# Patient Record
Sex: Male | Born: 1940
Health system: Southern US, Community
[De-identification: ages and names within clinical notes are randomized; demographics above are authoritative.]

## PROBLEM LIST (undated history)

## (undated) DIAGNOSIS — C9110 Chronic lymphocytic leukemia of B-cell type not having achieved remission: Secondary | ICD-10-CM

## (undated) DIAGNOSIS — K222 Esophageal obstruction: Secondary | ICD-10-CM

## (undated) DIAGNOSIS — G4730 Sleep apnea, unspecified: Secondary | ICD-10-CM

## (undated) DIAGNOSIS — E785 Hyperlipidemia, unspecified: Secondary | ICD-10-CM

## (undated) DIAGNOSIS — K648 Other hemorrhoids: Secondary | ICD-10-CM

## (undated) DIAGNOSIS — I4891 Unspecified atrial fibrillation: Secondary | ICD-10-CM

## (undated) DIAGNOSIS — K644 Residual hemorrhoidal skin tags: Secondary | ICD-10-CM

## (undated) DIAGNOSIS — K5790 Diverticulosis of intestine, part unspecified, without perforation or abscess without bleeding: Secondary | ICD-10-CM

## (undated) DIAGNOSIS — K219 Gastro-esophageal reflux disease without esophagitis: Secondary | ICD-10-CM

## (undated) DIAGNOSIS — E119 Type 2 diabetes mellitus without complications: Secondary | ICD-10-CM

## (undated) DIAGNOSIS — Z860100 Personal history of colon polyps, unspecified: Secondary | ICD-10-CM

## (undated) DIAGNOSIS — I10 Essential (primary) hypertension: Secondary | ICD-10-CM

## (undated) DIAGNOSIS — K449 Diaphragmatic hernia without obstruction or gangrene: Secondary | ICD-10-CM

## (undated) DIAGNOSIS — D72820 Lymphocytosis (symptomatic): Secondary | ICD-10-CM

## (undated) HISTORY — DX: Diaphragmatic hernia without obstruction or gangrene: K44.9

## (undated) HISTORY — DX: Esophageal obstruction: K22.2

## (undated) HISTORY — DX: Residual hemorrhoidal skin tags: K64.4

## (undated) HISTORY — DX: Chronic lymphocytic leukemia of B-cell type not having achieved remission: C91.10

## (undated) HISTORY — DX: Hyperlipidemia, unspecified: E78.5

## (undated) HISTORY — DX: Lymphocytosis (symptomatic): D72.820

## (undated) HISTORY — DX: Other hemorrhoids: K64.8

## (undated) HISTORY — PX: BLEPHAROPLASTY: SUR158

## (undated) HISTORY — DX: Gastro-esophageal reflux disease without esophagitis: K21.9

## (undated) HISTORY — DX: Personal history of colon polyps, unspecified: Z86.0100

## (undated) HISTORY — DX: Diverticulosis of intestine, part unspecified, without perforation or abscess without bleeding: K57.90

## (undated) HISTORY — PX: POLYPECTOMY: SHX149

## (undated) HISTORY — DX: Personal history of colonic polyps: Z86.010

## (undated) HISTORY — PX: HEMORRHOID SURGERY: SHX153

## (undated) HISTORY — DX: Sleep apnea, unspecified: G47.30

---

## 1999-09-27 ENCOUNTER — Ambulatory Visit (HOSPITAL_COMMUNITY): Admission: RE | Admit: 1999-09-27 | Discharge: 1999-09-27 | Payer: Self-pay | Admitting: Orthopedic Surgery

## 2000-12-16 HISTORY — PX: KNEE SURGERY: SHX244

## 2005-12-16 HISTORY — PX: SHOULDER SURGERY: SHX246

## 2007-01-09 ENCOUNTER — Emergency Department (HOSPITAL_COMMUNITY): Admission: EM | Admit: 2007-01-09 | Discharge: 2007-01-09 | Payer: Self-pay | Admitting: Emergency Medicine

## 2007-11-29 ENCOUNTER — Emergency Department (HOSPITAL_COMMUNITY): Admission: EM | Admit: 2007-11-29 | Discharge: 2007-11-29 | Payer: Self-pay | Admitting: Emergency Medicine

## 2010-01-18 ENCOUNTER — Encounter: Admission: RE | Admit: 2010-01-18 | Discharge: 2010-01-18 | Payer: Self-pay | Admitting: Endocrinology

## 2010-12-16 HISTORY — PX: COLONOSCOPY: SHX174

## 2011-09-23 LAB — CBC
HCT: 44.4
MCV: 91.4
Platelets: 255
RDW: 13.4

## 2011-09-23 LAB — I-STAT 8, (EC8 V) (CONVERTED LAB)
Acid-base deficit: 1
Bicarbonate: 23.7
TCO2: 25
pCO2, Ven: 39.1 — ABNORMAL LOW
pH, Ven: 7.391 — ABNORMAL HIGH

## 2011-09-23 LAB — DIFFERENTIAL
Monocytes Relative: 4
Neutrophils Relative %: 79 — ABNORMAL HIGH

## 2011-09-23 LAB — POCT CARDIAC MARKERS
CKMB, poc: 1.1
CKMB, poc: <1 — ABNORMAL LOW
Myoglobin, poc: 86
Operator id: 257131
Troponin i, poc: <0.05

## 2011-09-23 LAB — POCT I-STAT CREATININE: Operator id: 257131

## 2012-03-10 ENCOUNTER — Other Ambulatory Visit: Payer: Self-pay | Admitting: Gastroenterology

## 2012-03-16 ENCOUNTER — Ambulatory Visit
Admission: RE | Admit: 2012-03-16 | Discharge: 2012-03-16 | Disposition: A | Discharge status: Home / Self Care | Payer: Medicare Other | Source: Ambulatory Visit | Attending: Gastroenterology | Admitting: Gastroenterology

## 2013-06-30 ENCOUNTER — Other Ambulatory Visit: Payer: Medicare Other

## 2013-07-02 ENCOUNTER — Ambulatory Visit: Payer: Medicare Other | Admitting: Endocrinology

## 2013-07-12 ENCOUNTER — Other Ambulatory Visit: Payer: Self-pay | Admitting: *Deleted

## 2013-07-12 MED ORDER — DOXAZOSIN MESYLATE 4 MG PO TABS: 4.0000 mg | ORAL_TABLET | Freq: Every day | ORAL | Status: DC

## 2013-07-14 ENCOUNTER — Other Ambulatory Visit: Payer: Self-pay | Admitting: *Deleted

## 2013-07-14 DIAGNOSIS — E119 Type 2 diabetes mellitus without complications: Secondary | ICD-10-CM

## 2013-07-16 ENCOUNTER — Other Ambulatory Visit (INDEPENDENT_AMBULATORY_CARE_PROVIDER_SITE_OTHER): Payer: Medicare Other

## 2013-07-16 DIAGNOSIS — E119 Type 2 diabetes mellitus without complications: Secondary | ICD-10-CM

## 2013-07-16 LAB — COMPREHENSIVE METABOLIC PANEL
ALT: 19 U/L (ref 0–53)
AST: 17 U/L (ref 0–37)
BUN: 18 mg/dL (ref 6–23)
Chloride: 104 mEq/L (ref 96–112)
Creatinine, Ser: 1.2 mg/dL (ref 0.4–1.5)
Glucose, Bld: 215 mg/dL — ABNORMAL HIGH (ref 70–99)
Potassium: 3.8 mEq/L (ref 3.5–5.1)
Total Bilirubin: 0.3 mg/dL (ref 0.3–1.2)

## 2013-07-16 LAB — LIPID PANEL
HDL: 56.2 mg/dL (ref >39.00)
VLDL: 15.6 mg/dL (ref 0.0–40.0)

## 2013-07-16 LAB — URINALYSIS
Ketones, ur: NEGATIVE
Specific Gravity, Urine: 1.01 (ref 1.000–1.030)
Urine Glucose: >=1000
Urobilinogen, UA: 0.2 (ref 0.0–1.0)

## 2013-07-16 LAB — MICROALBUMIN / CREATININE URINE RATIO: Microalb Creat Ratio: 0.6 mg/g (ref 0.0–30.0)

## 2013-07-20 ENCOUNTER — Encounter: Payer: Self-pay | Admitting: Endocrinology

## 2013-07-20 ENCOUNTER — Ambulatory Visit (INDEPENDENT_AMBULATORY_CARE_PROVIDER_SITE_OTHER): Payer: Medicare Other | Admitting: Endocrinology

## 2013-07-20 ENCOUNTER — Other Ambulatory Visit: Payer: Self-pay | Admitting: *Deleted

## 2013-07-20 VITALS — BP 126/80 | HR 56 | Temp 98.2°F | Resp 12 | Ht 67.0 in | Wt 166.3 lb

## 2013-07-20 DIAGNOSIS — E7800 Pure hypercholesterolemia, unspecified: Secondary | ICD-10-CM

## 2013-07-20 DIAGNOSIS — IMO0001 Reserved for inherently not codable concepts without codable children: Secondary | ICD-10-CM

## 2013-07-20 DIAGNOSIS — E119 Type 2 diabetes mellitus without complications: Secondary | ICD-10-CM | POA: Insufficient documentation

## 2013-07-20 DIAGNOSIS — I10 Essential (primary) hypertension: Secondary | ICD-10-CM

## 2013-07-20 MED ORDER — BENAZEPRIL HCL 20 MG PO TABS: 20.0000 mg | ORAL_TABLET | Freq: Every day | ORAL | Status: DC

## 2013-07-20 NOTE — Patient Instructions (Signed)
Levemir 19  In pm and 6 in am  Please check blood sugars at least half the time about 2 hours after any meal and as directed on waking up. Please bring blood sugar monitor to each visit  Cover Carbs with 1 unit for every 10 grams

## 2013-07-20 NOTE — Progress Notes (Signed)
Patient ID: Tony Marshall, male   DOB: 06-07-1941, 72 y.o.   MRN: [id number]  Tony Marshall is an 72 y.o. male.   Reason for Appointment: Diabetes follow-up   History of Present Illness   Diagnosis: Type 2 DIABETES MELITUS, date of diagnosis:  1992        Oral hypoglycemic drugs: Actos        Side effects from medications: None Insulin regimen: Levemir insulin 7 units in the morning and 17 in the evening, mealtime insulin 2-8 units           Proper timing of medications in relation to meals: Yes.         Monitors blood glucose: Once a day.    Glucometer: One Touch.          Blood Glucose readings from meter download: readings before breakfast: 127-248 with median about 170, nonfasting 44-270 with low sugars mid-day or before supper and high sugars after lunch  Hypoglycemia frequency:  midday or supper time, lowest reading at 6 PM.          Meals: 3 meals per day.  he is eating more at lunch sometimes and will eat much more carbohydrates in the form of sandwiches         Physical activity: exercise: Gardening and walking, no gram exercise usually            His blood sugar has been very difficult to control over the last few years because of variability in his blood sugars He is essentially beginning of a type I patient with requiring relatively low amounts of insulin and twice a day dosage of basal insulin He is probably doing somewhat better with Levemir compared to Lantus However his FASTING blood sugars are still quite variable and recently higher for no apparent reason. He has not increased his evening Levemir despite higher fasting readings He is not checking his readings after supper and not clear how well his evening insulin dosage is working  Also he has recently cut back on carbohydrate at lunchtime which was increasing his blood sugar, also has not been paying attention to the amount of carbohydrate with regard to insulin dose  Appointment on 07/16/2013  Component Date  Value Range Status  . Sodium 07/16/2013 138  135 - 145 mEq/L Final  . Potassium 07/16/2013 3.8  3.5 - 5.1 mEq/L Final  . Chloride 07/16/2013 104  96 - 112 mEq/L Final  . CO2 07/16/2013 28  19 - 32 mEq/L Final  . Glucose, Bld 07/16/2013 215* 70 - 99 mg/dL Final  . BUN 40/98/1191 18  6 - 23 mg/dL Final  . Creatinine, Ser 07/16/2013 1.2  0.4 - 1.5 mg/dL Final  . Total Bilirubin 07/16/2013 0.3  0.3 - 1.2 mg/dL Final  . Alkaline Phosphatase 07/16/2013 32* 39 - 117 U/L Final  . AST 07/16/2013 17  0 - 37 U/L Final  . ALT 07/16/2013 19  0 - 53 U/L Final  . Total Protein 07/16/2013 6.5  6.0 - 8.3 g/dL Final  . Albumin 47/82/9562 3.8  3.5 - 5.2 g/dL Final  . Calcium 13/07/6577 9.1  8.4 - 10.5 mg/dL Final  . GFR 46/96/2952 61.32  >60.00 mL/min Final  . Hemoglobin A1C 07/16/2013 7.9* 4.6 - 6.5 % Final   Glycemic Control Guidelines for People with Diabetes:Non Diabetic:  <6%Goal of Therapy: <7%Additional Action Suggested:  >8%   . Color, Urine 07/16/2013 LT. YELLOW  Yellow;Lt. Yellow Final  . APPearance 07/16/2013  CLEAR  Clear Final  . Specific Gravity, Urine 07/16/2013 1.010  1.000-1.030 Final  . pH 07/16/2013 6.0  5.0 - 8.0 Final  . Total Protein, Urine 07/16/2013 NEGATIVE  Negative Final  . Urine Glucose 07/16/2013 >=1000  Negative Final  . Ketones, ur 07/16/2013 NEGATIVE  Negative Final  . Bilirubin Urine 07/16/2013 NEGATIVE  Negative Final  . Hgb urine dipstick 07/16/2013 NEGATIVE  Negative Final  . Urobilinogen, UA 07/16/2013 0.2  0.0 - 1.0 Final  . Leukocytes, UA 07/16/2013 NEGATIVE  Negative Final  . Nitrite 07/16/2013 NEGATIVE  Negative Final  . Microalb, Ur 07/16/2013 0.3  0.0 - 1.9 mg/dL Final  . Creatinine,U 62/95/2841 47.7   Final  . Microalb Creat Ratio 07/16/2013 0.6  0.0 - 30.0 mg/g Final  . Cholesterol 07/16/2013 147  0 - 200 mg/dL Final   ATP III Classification       Desirable:  < 200 mg/dL               Borderline High:  200 - 239 mg/dL          High:  > = 324 mg/dL  .  Triglycerides 07/16/2013 78.0  0.0 - 149.0 mg/dL Final   Normal:  <401 mg/dLBorderline High:  150 - 199 mg/dL  . HDL 07/16/2013 56.20  >39.00 mg/dL Final  . VLDL 02/72/5366 15.6  0.0 - 40.0 mg/dL Final  . LDL Cholesterol 07/16/2013 75  0 - 99 mg/dL Final  . Total CHOL/HDL Ratio 07/16/2013 3   Final                  Men          Women1/2 Average Risk     3.4          3.3Average Risk          5.0          4.42X Average Risk          9.6          7.13X Average Risk          15.0          11.0                          Medication List       This list is accurate as of: 07/20/13 11:59 PM.  Always use your most recent med list.               amLODipine 10 MG tablet  Commonly known as:  NORVASC  10 mg.     benazepril 20 MG tablet  Commonly known as:  LOTENSIN  Take 1 tablet (20 mg total) by mouth daily.     carvedilol 25 MG tablet  Commonly known as:  COREG  25 mg.     doxazosin 4 MG tablet  Commonly known as:  CARDURA  Take 1 tablet (4 mg total) by mouth at bedtime.     LEVEMIR FLEXPEN 100 UNIT/ML Sopn  Generic drug:  Insulin Detemir  7 Units. 7 units in am and 17 in pm     NOVOLOG FLEXPEN [place]  Inject 6-9 Units into the skin. 3-4 units at breakfast, 6-9 units at supper     pioglitazone 15 MG tablet  Commonly known as:  ACTOS     rosuvastatin 10 MG tablet  Commonly known as:  CRESTOR  Take 10 mg by mouth daily.  Allergies: No Known Allergies  No past medical history on file.  Past Surgical History  Procedure Laterality Date  . Hemorrhoid surgery      Sclerotherapy    Family History  Problem Relation Age of Onset  . Diabetes Father     Social History:  reports that he has never smoked. He does not have any smokeless tobacco history on file. His alcohol and drug histories are not on file.  Review of Systems:  He previously has had problems with reflux and denies any dysphagia now  HYPERTENSION:  home 139/79  HYPERLIPIDEMIA: The lipid abnormality  consists of elevated LDL which is very well controlled and is 75.  No nocturia  Last colonoscopy was in April 2008     Examination:   BP 126/80  Pulse 56  Temp(Src) 98.2 F (36.8 C)  Resp 12  Ht 5\' 7"  (1.702 m)  Wt 166 lb 4.8 oz (75.433 kg)  BMI 26.04 kg/m2  SpO2 96%  Body mass index is 26.04 kg/(m^2).   ASSESSMENT/ PLAN::   Diabetes type 2, poorly controlled with higher A1c  Problems identified:   Inadequate glucose monitoring at various times especially after supper where he had no readings.  Frequently high readings in the mornings although not consistent and may be related to higher postprandial readings the night before  Not using carbohydrate counting for covering meals and may be getting high readings sometimes after breakfast lunch with taking only 2 units of insulin for large carbohydrate meals  Hypoglycemia which may be partly related to low estimating insulin requirement at meals and also increased activity on some days  His evening Levemir will be increased by 2 units and morning dosage reduced by one unit. He will use carbohydrate counting as above and more for high fat meals  Emphasized the need for glucose monitoring especially after supper  HYPERTENSION: Well controlled and will continue same regimen  Counseling time over 50% of today's 25 minute visit  Brendalee Matthies 07/21/2013, 1:03 PM

## 2013-07-21 ENCOUNTER — Encounter: Payer: Self-pay | Admitting: Endocrinology

## 2013-07-21 DIAGNOSIS — E7800 Pure hypercholesterolemia, unspecified: Secondary | ICD-10-CM | POA: Insufficient documentation

## 2013-07-21 DIAGNOSIS — I10 Essential (primary) hypertension: Secondary | ICD-10-CM | POA: Insufficient documentation

## 2013-08-17 ENCOUNTER — Telehealth: Payer: Self-pay | Admitting: Endocrinology

## 2013-08-28 ENCOUNTER — Encounter (HOSPITAL_COMMUNITY): Payer: Self-pay | Admitting: *Deleted

## 2013-08-28 ENCOUNTER — Emergency Department (HOSPITAL_COMMUNITY)
Admission: EM | Admit: 2013-08-28 | Discharge: 2013-08-28 | Disposition: A | Discharge status: Home / Self Care | Payer: Medicare Other | Attending: Emergency Medicine | Admitting: Emergency Medicine

## 2013-08-28 DIAGNOSIS — I10 Essential (primary) hypertension: Secondary | ICD-10-CM | POA: Insufficient documentation

## 2013-08-28 DIAGNOSIS — F17200 Nicotine dependence, unspecified, uncomplicated: Secondary | ICD-10-CM | POA: Insufficient documentation

## 2013-08-28 DIAGNOSIS — E119 Type 2 diabetes mellitus without complications: Secondary | ICD-10-CM | POA: Insufficient documentation

## 2013-08-28 DIAGNOSIS — Z794 Long term (current) use of insulin: Secondary | ICD-10-CM | POA: Insufficient documentation

## 2013-08-28 DIAGNOSIS — Z7982 Long term (current) use of aspirin: Secondary | ICD-10-CM | POA: Insufficient documentation

## 2013-08-28 DIAGNOSIS — Z79899 Other long term (current) drug therapy: Secondary | ICD-10-CM | POA: Insufficient documentation

## 2013-08-28 DIAGNOSIS — IMO0001 Reserved for inherently not codable concepts without codable children: Secondary | ICD-10-CM | POA: Insufficient documentation

## 2013-08-28 DIAGNOSIS — M62838 Other muscle spasm: Secondary | ICD-10-CM

## 2013-08-28 HISTORY — DX: Essential (primary) hypertension: I10

## 2013-08-28 HISTORY — DX: Type 2 diabetes mellitus without complications: E11.9

## 2013-08-28 LAB — CBC WITH DIFFERENTIAL/PLATELET
Basophils Absolute: 0 K/uL (ref 0.0–0.1)
Basophils Relative: 0 % (ref 0–1)
Eosinophils Absolute: 0.2 K/uL (ref 0.0–0.7)
Eosinophils Relative: 1 % (ref 0–5)
HCT: 42.3 % (ref 39.0–52.0)
Hemoglobin: 14.6 g/dL (ref 13.0–17.0)
Lymphocytes Relative: 58 % — ABNORMAL HIGH (ref 12–46)
Lymphs Abs: 9.8 K/uL — ABNORMAL HIGH (ref 0.7–4.0)
MCH: 31.4 pg (ref 26.0–34.0)
MCHC: 34.5 g/dL (ref 30.0–36.0)
MCV: 91 fL (ref 78.0–100.0)
Monocytes Absolute: 0.8 10*3/uL (ref 0.1–1.0)
Monocytes Relative: 5 % (ref 3–12)
Neutro Abs: 6.1 K/uL (ref 1.7–7.7)
Neutrophils Relative %: 36 % — ABNORMAL LOW (ref 43–77)
Platelets: 219 10*3/uL (ref 150–400)
RBC: 4.65 MIL/uL (ref 4.22–5.81)
RDW: 13.6 % (ref 11.5–15.5)
WBC: 16.9 10*3/uL — ABNORMAL HIGH (ref 4.0–10.5)

## 2013-08-28 LAB — BASIC METABOLIC PANEL
CO2: 26 mEq/L (ref 19–32)
Chloride: 98 mEq/L (ref 96–112)
Glucose, Bld: 244 mg/dL — ABNORMAL HIGH (ref 70–99)
Potassium: 4.2 mEq/L (ref 3.5–5.1)
Sodium: 134 mEq/L — ABNORMAL LOW (ref 135–145)

## 2013-08-28 LAB — BASIC METABOLIC PANEL WITH GFR
BUN: 16 mg/dL (ref 6–23)
Calcium: 9.4 mg/dL (ref 8.4–10.5)
Creatinine, Ser: 1.02 mg/dL (ref 0.50–1.35)
GFR calc Af Amer: 85 mL/min — ABNORMAL LOW (ref >90)
GFR calc non Af Amer: 73 mL/min — ABNORMAL LOW (ref >90)

## 2013-08-28 MED ORDER — DIAZEPAM 5 MG PO TABS
5.0000 mg | ORAL_TABLET | Freq: Once | ORAL | Status: AC
  Administered 2013-08-28: 5 mg via ORAL
  Filled 2013-08-28: qty 1

## 2013-08-28 MED ORDER — ALPRAZOLAM 1 MG PO TABS: 1.0000 mg | ORAL_TABLET | Freq: Every evening | ORAL | Status: DC | PRN

## 2013-08-28 NOTE — ED Provider Notes (Signed)
CSN: [id number]     Arrival date & time 08/28/13  1032 History   First MD Initiated Contact with Patient 08/28/13 1110     Chief Complaint  Patient presents with  . Spasms   (Consider location/radiation/quality/duration/timing/severity/associated sxs/prior Treatment) HPI Comments: Pt describes quick, short lasting, muscle jerking episodes that are intermittent, fairly frequent that first began last night in upper calf last night.  It has progressed to involve all parts of his body including trunk, neck and chest wall.  Didn't sleep at all last night due to continued jerks.  No recent new medications, not dehydrated, no change in diet, no h/o thyroid problems, no prior neck surgeries, no fevers, chills.  No N/V/D recently.  He is diabetic, not on diuretics.  No prior h/o same.  He had eyelid surgery bilaterally on Monday and usually takes xanax at night to help sleep, has not taken any since Monday night.    The history is provided by the patient and the spouse.    Past Medical History  Diagnosis Date  . Diabetes mellitus without complication   . Hypertension    Past Surgical History  Procedure Laterality Date  . Hemorrhoid surgery      Sclerotherapy  . Blepharoplasty     Family History  Problem Relation Age of Onset  . Diabetes Father    History  Substance Use Topics  . Smoking status: Current Every Day Smoker    Types: Cigarettes  . Smokeless tobacco: Not on file  . Alcohol Use: Yes     Comment: minimal    Review of Systems  Constitutional: Negative for fever and chills.  Gastrointestinal: Negative for nausea, vomiting and diarrhea.  Musculoskeletal: Positive for myalgias. Negative for joint swelling and arthralgias.  Skin: Negative for color change and wound.  Neurological: Negative for dizziness, weakness and numbness.  All other systems reviewed and are negative.    Allergies  Review of patient's allergies indicates no known allergies.  Home Medications    Current Outpatient Rx  Name  Route  Sig  Dispense  Refill  . amLODipine (NORVASC) 10 MG tablet   Oral   Take 10 mg by mouth daily.          . benazepril (LOTENSIN) 20 MG tablet   Oral   Take 1 tablet (20 mg total) by mouth daily.   90 tablet   3   . carvedilol (COREG) 25 MG tablet   Oral   Take 25 mg by mouth 2 (two) times daily with a meal.          . doxazosin (CARDURA) 4 MG tablet   Oral   Take 1 tablet (4 mg total) by mouth at bedtime.   90 tablet   1   . Insulin Aspart (NOVOLOG FLEXPEN [place])   Subcutaneous   Inject 3-9 Units into the skin. 3-4 units at breakfast, 6-9 units at supper         . LEVEMIR FLEXPEN 100 UNIT/ML SOPN   Subcutaneous   Inject 7-17 Units into the skin 2 (two) times daily. 7 units in am and 17 in pm         . metFORMIN (GLUCOPHAGE) 1000 MG tablet   Oral   Take 1,000 mg by mouth 2 (two) times daily with a meal.         . pioglitazone (ACTOS) 15 MG tablet   Oral   Take 15 mg by mouth daily.          Marland Kitchen  rosuvastatin (CRESTOR) 5 MG tablet   Oral   Take 5 mg by mouth every morning.         Marland Kitchen ALPRAZolam (XANAX) 1 MG tablet   Oral   Take 1 tablet (1 mg total) by mouth at bedtime as needed for sleep.   14 tablet   0   . aspirin 81 MG EC tablet   Oral   Take 81 mg by mouth daily. Swallow whole.          BP 121/69  Pulse 60  Temp(Src) 98.2 F (36.8 C) (Oral)  Resp 13  SpO2 98% Physical Exam  Nursing note and vitals reviewed. Constitutional: He is oriented to person, place, and time. He appears well-developed and well-nourished.  HENT:  Head: Normocephalic and atraumatic.  Eyes: EOM are normal. Pupils are equal, round, and reactive to light. Right eye exhibits normal extraocular motion. Left eye exhibits normal extraocular motion.  Periorbital bruising, dependent related to recent eyelid surgery  Neck: Normal range of motion. Neck supple.  Cardiovascular: Normal rate, regular rhythm and intact distal pulses.    Pulmonary/Chest: Effort normal. No respiratory distress. He has no wheezes.  Abdominal: Soft. There is no tenderness. There is no rebound.  Neurological: He is alert and oriented to person, place, and time. No cranial nerve deficit. Coordination normal.  Skin: Skin is warm and dry. No rash noted. No pallor.  Psychiatric: He has a normal mood and affect.    ED Course  Procedures (including critical care time) Labs Review Labs Reviewed  CBC WITH DIFFERENTIAL - Abnormal; Notable for the following:    WBC 16.9 (*)    Neutrophils Relative % 36 (*)    Lymphocytes Relative 58 (*)    Lymphs Abs 9.8 (*)    All other components within normal limits  BASIC METABOLIC PANEL - Abnormal; Notable for the following:    Sodium 134 (*)    Glucose, Bld 244 (*)    GFR calc non Af Amer 73 (*)    GFR calc Af Amer 85 (*)    All other components within normal limits   Imaging Review No results found.   RA sat is 97% and I interpret to be adequate  ECG at time 11:31 shows SR at rate 60, wandering baseline, poor quality, no overt ST or T wave abn's, normal axis.     1:01 PM After po valium, spasms have decreased and he feels improved.   He did run out of his xanax at night time, he had called pharmacy and they were supposed to reach out to PCP to get a refill, his usual PCP is out until next week.  I will give 1 week of xanax and he needs to follow up with PCP at that time for further and also to make sure that symptoms are resolved.    MDM   1. Muscle spasms of head or neck      Pt with brief jerks of muscles, I witnessed a single episode involving neck where it seemed his ability to hold head up gave way for a brief second and he then jerked his head back up to keep it up.  Pt reports jerks are not painful.          Tony Marshall. Liat Mayol, MD 08/28/13 1304

## 2013-08-28 NOTE — ED Notes (Signed)
Pt reports having muscle spasms to chest and entire body since yesterday afternoon, unable to sleep. ekg being done at triage, no acute distress noted at this time

## 2013-08-28 NOTE — Discharge Instructions (Signed)
 Muscle Cramps Muscle cramps are due to sudden involuntary muscle contraction. This means you have no control over the tightening of a muscle (or muscles). Often there are no obvious causes. Muscle cramps may occur with overexertion. They may also occur with chilling of the muscles. An example of a muscle chilling activity is swimming. It is uncommon for cramps to be due to a serious underlying disorder. In most cases, muscle cramps improve (or leave) within minutes. CAUSES  Some common causes are:  Injury.  Infections, especially viral.  Abnormal levels of the salts and ions in your blood (electrolytes). This could happen if you are taking water  pills (diuretics).  Blood vessel disease where not enough blood is getting to the muscles (intermittent claudication). Some uncommon causes are:  Side effects of some medicine (such as lithium).  Alcohol  abuse.  Diseases where there is soreness (inflammation) of the muscular system. HOME CARE INSTRUCTIONS   It may be helpful to massage, stretch, and relax the affected muscle.  Taking a dose of over-the-counter diphenhydramine  is helpful for night leg cramps. SEEK MEDICAL CARE IF:  Cramps are frequent and not relieved with medicine. MAKE SURE YOU:   Understand these instructions.  Will watch your condition.  Will get help right away if you are not doing well or get worse. Document Released: 05/24/2002 Document Revised: 02/24/2012 Document Reviewed: 11/23/2008 [facility] Patient Information 2014 [place], [place].   Narcotic and benzodiazepine use may cause drowsiness, slowed breathing or dependence.  Please use with caution and do not drive, operate machinery or watch young children alone while taking them.  Taking combinations of these medications or drinking alcohol  will potentiate these effects.

## 2013-08-28 NOTE — ED Notes (Signed)
Family at bedside. 

## 2013-08-28 NOTE — ED Notes (Signed)
Pt reports individual muscles "shooting" rapidly every few minutes since last night. States it is worse when he isn't moving. Episodes last >1 sec, no pain. Denies recent change in rx. Bruising noted around bilateral eyes due to surgery on 9/8.

## 2013-08-31 ENCOUNTER — Encounter: Payer: Self-pay | Admitting: Endocrinology

## 2013-08-31 ENCOUNTER — Ambulatory Visit (INDEPENDENT_AMBULATORY_CARE_PROVIDER_SITE_OTHER): Payer: Medicare Other | Admitting: Endocrinology

## 2013-08-31 VITALS — BP 134/72 | HR 53 | Temp 98.5°F | Resp 12 | Ht 67.5 in | Wt 163.4 lb

## 2013-08-31 DIAGNOSIS — G253 Myoclonus: Secondary | ICD-10-CM

## 2013-08-31 DIAGNOSIS — IMO0001 Reserved for inherently not codable concepts without codable children: Secondary | ICD-10-CM

## 2013-08-31 DIAGNOSIS — T50904A Poisoning by unspecified drugs, medicaments and biological substances, undetermined, initial encounter: Secondary | ICD-10-CM

## 2013-08-31 DIAGNOSIS — G4700 Insomnia, unspecified: Secondary | ICD-10-CM

## 2013-08-31 DIAGNOSIS — D72820 Lymphocytosis (symptomatic): Secondary | ICD-10-CM

## 2013-08-31 DIAGNOSIS — R109 Unspecified abdominal pain: Secondary | ICD-10-CM

## 2013-08-31 DIAGNOSIS — K219 Gastro-esophageal reflux disease without esophagitis: Secondary | ICD-10-CM

## 2013-08-31 LAB — CBC WITH DIFFERENTIAL/PLATELET
Basophils Relative: 0.4 % (ref 0.0–3.0)
Eosinophils Absolute: 0.3 10*3/uL (ref 0.0–0.7)
HCT: 40.4 % (ref 39.0–52.0)
Hemoglobin: 13.6 g/dL (ref 13.0–17.0)
Monocytes Absolute: 0.7 10*3/uL (ref 0.1–1.0)
Neutrophils Relative %: 24.5 % — ABNORMAL LOW (ref 43.0–77.0)
Platelets: 204 10*3/uL (ref 150.0–400.0)
RBC: 4.42 Mil/uL (ref 4.22–5.81)
WBC: 15.5 10*3/uL — ABNORMAL HIGH (ref 4.5–10.5)

## 2013-08-31 LAB — HEMOGLOBIN A1C: Hgb A1c MFr Bld: 8.2 % — ABNORMAL HIGH (ref 4.6–6.5)

## 2013-08-31 MED ORDER — ZOLPIDEM TARTRATE 5 MG PO TABS: 5.0000 mg | ORAL_TABLET | Freq: Every evening | ORAL | Status: DC | PRN

## 2013-08-31 MED ORDER — DEXLANSOPRAZOLE 30 MG PO CPDR: 30.0000 mg | DELAYED_RELEASE_CAPSULE | Freq: Every day | ORAL | Status: DC

## 2013-08-31 NOTE — Progress Notes (Signed)
Chief complaint: Followup of ER visit and insomnia  History of Present Illness:  1. He went to the emergency room 3 days ago because of jerky movements of his muscles of the trunk or neck starting last Friday. He had no other symptoms and apparently had stopped taking the Xanax that he has been using at bedtime for about 4 days. His symptoms resolved with a dose of Valium in the emergency room and has not had any recurrence. 2. He now reveals that he has been taking his wife's Xanax to help him sleep at night for several years. Has not had a hypnotic otherwise. Asking for sleeping medication as he has not been able to sleep the last 2 or 3 nights 3.  he has had some discomfort and burning sensation in his upper stomach. Currently taking Nexium from his wife which does not help much. He thinks Dexilant had helped much better previously. This was not covered by his insurance Also not eating as much in the last 4 weeks     Medication List       This list is accurate as of: 08/31/13  8:28 AM.  Always use your most recent med list.               ALPRAZolam 1 MG tablet  Commonly known as:  XANAX  Take 1 tablet (1 mg total) by mouth at bedtime as needed for sleep.     amLODipine 10 MG tablet  Commonly known as:  NORVASC  Take 10 mg by mouth daily.     aspirin 81 MG EC tablet  Take 81 mg by mouth daily. Swallow whole.     benazepril 20 MG tablet  Commonly known as:  LOTENSIN  Take 1 tablet (20 mg total) by mouth daily.     carvedilol 25 MG tablet  Commonly known as:  COREG  Take 25 mg by mouth 2 (two) times daily with a meal.     doxazosin 4 MG tablet  Commonly known as:  CARDURA  Take 1 tablet (4 mg total) by mouth at bedtime.     LEVEMIR FLEXPEN 100 UNIT/ML Sopn  Generic drug:  Insulin Detemir  Inject 7-17 Units into the skin 2 (two) times daily. 7 units in am and 17 in pm     metFORMIN 1000 MG tablet  Commonly known as:  GLUCOPHAGE  Take 1,000 mg by mouth 2 (two) times  daily with a meal.     NOVOLOG FLEXPEN [place]  Inject 3-9 Units into the skin. 3-4 units at breakfast, 6-9 units at supper     pioglitazone 15 MG tablet  Commonly known as:  ACTOS  Take 15 mg by mouth daily.     rosuvastatin 5 MG tablet  Commonly known as:  CRESTOR  Take 5 mg by mouth every morning.        Allergies: No Known Allergies  Past Medical History  Diagnosis Date  . Diabetes mellitus without complication   . Hypertension     Past Surgical History  Procedure Laterality Date  . Hemorrhoid surgery      Sclerotherapy  . Blepharoplasty      Family History  Problem Relation Age of Onset  . Diabetes Father     Social History:  reports that he has been smoking Cigarettes.  He has been smoking about 0.00 packs per day. He does not have any smokeless tobacco history on file. He reports that  drinks alcohol. He reports that he  does not use illicit drugs.  Review of Systems   His hypertension appears to be well controlled. Diabetes: Did not bring his monitor for review today No nausea or dysphagia Recent weight loss No fever or night sweats  LABS:  Admission on 08/28/2013, Discharged on 08/28/2013  Component Date Value Range Status  . WBC 08/28/2013 16.9* 4.0 - 10.5 K/uL Final  . RBC 08/28/2013 4.65  4.22 - 5.81 MIL/uL Final  . Hemoglobin 08/28/2013 14.6  13.0 - 17.0 g/dL Final  . HCT 16/09/9603 42.3  39.0 - 52.0 % Final  . MCV 08/28/2013 91.0  78.0 - 100.0 fL Final  . MCH 08/28/2013 31.4  26.0 - 34.0 pg Final  . MCHC 08/28/2013 34.5  30.0 - 36.0 g/dL Final  . RDW 54/08/8118 13.6  11.5 - 15.5 % Final  . Platelets 08/28/2013 219  150 - 400 K/uL Final  . Neutrophils Relative % 08/28/2013 36* 43 - 77 % Final  . Lymphocytes Relative 08/28/2013 58* 12 - 46 % Final  . Monocytes Relative 08/28/2013 5  3 - 12 % Final  . Eosinophils Relative 08/28/2013 1  0 - 5 % Final  . Basophils Relative 08/28/2013 0  0 - 1 % Final  . Neutro Abs 08/28/2013 6.1  1.7 - 7.7 K/uL Final   . Lymphs Abs 08/28/2013 9.8* 0.7 - 4.0 K/uL Final  . Monocytes Absolute 08/28/2013 0.8  0.1 - 1.0 K/uL Final  . Eosinophils Absolute 08/28/2013 0.2  0.0 - 0.7 K/uL Final  . Basophils Absolute 08/28/2013 0.0  0.0 - 0.1 K/uL Final  . WBC Morphology 08/28/2013 ATYPICAL LYMPHOCYTES   Final  . Sodium 08/28/2013 134* 135 - 145 mEq/L Final  . Potassium 08/28/2013 4.2  3.5 - 5.1 mEq/L Final  . Chloride 08/28/2013 98  96 - 112 mEq/L Final  . CO2 08/28/2013 26  19 - 32 mEq/L Final  . Glucose, Bld 08/28/2013 244* 70 - 99 mg/dL Final  . BUN 14/78/2956 16  6 - 23 mg/dL Final  . Creatinine, Ser 08/28/2013 1.02  0.50 - 1.35 mg/dL Final  . Calcium 21/30/8657 9.4  8.4 - 10.5 mg/dL Final  . GFR calc non Af Amer 08/28/2013 73* >90 mL/min Final  . GFR calc Af Amer 08/28/2013 85* >90 mL/min Final   Comment: (NOTE)                          The eGFR has been calculated using the CKD EPI equation.                          This calculation has not been validated in all clinical situations.                          eGFR's persistently <90 mL/min signify possible Chronic Kidney                          Disease.    EXAM:  BP 134/72  Pulse 53  Temp(Src) 98.5 F (36.9 C)  Resp 12  Ht 5' 7.5" (1.715 m)  Wt 163 lb 6.4 oz (74.118 kg)  BMI 25.2 kg/m2  SpO2 97%  No pallor He has ecchymoses on the lower eyelids from recent eye surgery No lymphadenopathy or thyromegaly in the neck  Abdominal exam shows no distention, hepatosplenomegaly or other mass No peripheral edema  Assessment/Plan:   1. Myoclonic jerks may have been from withdrawal from long-term Xanax. Apparently resolved spontaneously with  taking Valium in the ER 2. Chronic insomnia apparently started when he had the accident on his finger. He is not depressed and is asking for hypnotic. Will try Ambien 5 mg 3. Abdominal discomfort with history of reflux. Will try him on Dexilant first instead of consider GI consultation if not  better 4. Lymphocytosis noted on ER lab work with atypical lymphocytes. Will repeat today and send blood smear for pathology review. Consider hematology consultation. Discussed briefly with patient. He is likely to be symptomatic and does not have any lymphadenopathy or systemic symptoms 5. Hypertension: Well controlled   Mabel Unrein 08/31/2013, 8:28 AM

## 2013-09-01 LAB — PATHOLOGIST SMEAR REVIEW

## 2013-09-02 ENCOUNTER — Other Ambulatory Visit: Payer: Self-pay | Admitting: Endocrinology

## 2013-09-02 DIAGNOSIS — D72820 Lymphocytosis (symptomatic): Secondary | ICD-10-CM

## 2013-09-09 ENCOUNTER — Telehealth: Payer: Self-pay | Admitting: Endocrinology

## 2013-09-10 ENCOUNTER — Telehealth: Payer: Self-pay | Admitting: *Deleted

## 2013-09-10 NOTE — Telephone Encounter (Signed)
Left message on voicemail informing patient that a referral has been made and someone should be calling him to schedule that.

## 2013-09-10 NOTE — Telephone Encounter (Signed)
Pt is wanting to know about his referral for his stomach pain, please advise

## 2013-09-13 NOTE — Telephone Encounter (Signed)
He may have seen a GI Dr before

## 2013-09-14 ENCOUNTER — Telehealth: Payer: Self-pay | Admitting: *Deleted

## 2013-09-14 NOTE — Telephone Encounter (Signed)
Check with referral person who will schedule

## 2013-09-14 NOTE — Telephone Encounter (Signed)
Pt still has not heard about the referral for his white cell count.

## 2013-09-14 NOTE — Telephone Encounter (Signed)
Left message for [facility] at [place]

## 2013-09-14 NOTE — Telephone Encounter (Signed)
Referral was sent on the 18th, patient notified, Tony Marshall from [facility] was going to check with them to find out what is taking so long.

## 2013-09-15 ENCOUNTER — Telehealth: Payer: Self-pay | Admitting: Internal Medicine

## 2013-09-15 NOTE — Telephone Encounter (Signed)
S/W PT WIFE AND GVE NP APPT 10/09 @ 2 W/DR. CHISM. REFERRING DR. Lucianne Muss DX- ATYPICAL LYMPHOCYTOSIS WELCOME PACKET MAILED.

## 2013-09-20 ENCOUNTER — Telehealth: Payer: Self-pay | Admitting: Internal Medicine

## 2013-09-20 NOTE — Telephone Encounter (Signed)
C/D 09/20/13 for appt. 09/23/13

## 2013-09-22 ENCOUNTER — Other Ambulatory Visit: Payer: Self-pay | Admitting: Internal Medicine

## 2013-09-22 DIAGNOSIS — D72820 Lymphocytosis (symptomatic): Secondary | ICD-10-CM

## 2013-09-23 ENCOUNTER — Ambulatory Visit (HOSPITAL_BASED_OUTPATIENT_CLINIC_OR_DEPARTMENT_OTHER): Payer: Medicare Other

## 2013-09-23 ENCOUNTER — Other Ambulatory Visit (HOSPITAL_COMMUNITY)
Admission: RE | Admit: 2013-09-23 | Discharge: 2013-09-23 | Disposition: A | Discharge status: Home / Self Care | Payer: Medicare Other | Source: Ambulatory Visit | Attending: Internal Medicine | Admitting: Internal Medicine

## 2013-09-23 ENCOUNTER — Encounter: Payer: Self-pay | Admitting: Internal Medicine

## 2013-09-23 ENCOUNTER — Ambulatory Visit (HOSPITAL_BASED_OUTPATIENT_CLINIC_OR_DEPARTMENT_OTHER): Payer: Medicare Other | Admitting: Internal Medicine

## 2013-09-23 ENCOUNTER — Telehealth: Payer: Self-pay | Admitting: Internal Medicine

## 2013-09-23 ENCOUNTER — Ambulatory Visit (HOSPITAL_BASED_OUTPATIENT_CLINIC_OR_DEPARTMENT_OTHER): Payer: Medicare Other | Admitting: Lab

## 2013-09-23 VITALS — BP 169/78 | HR 56 | Temp 98.4°F | Resp 18 | Ht 67.5 in | Wt 162.1 lb

## 2013-09-23 DIAGNOSIS — D72820 Lymphocytosis (symptomatic): Secondary | ICD-10-CM | POA: Insufficient documentation

## 2013-09-23 DIAGNOSIS — I10 Essential (primary) hypertension: Secondary | ICD-10-CM

## 2013-09-23 DIAGNOSIS — E119 Type 2 diabetes mellitus without complications: Secondary | ICD-10-CM

## 2013-09-23 LAB — COMPREHENSIVE METABOLIC PANEL (CC13)
AST: 14 U/L (ref 5–34)
Alkaline Phosphatase: 39 U/L — ABNORMAL LOW (ref 40–150)
BUN: 19.2 mg/dL (ref 7.0–26.0)
Creatinine: 1.1 mg/dL (ref 0.7–1.3)
Potassium: 3.9 mEq/L (ref 3.5–5.1)
Total Bilirubin: 0.43 mg/dL (ref 0.20–1.20)

## 2013-09-23 LAB — CBC WITH DIFFERENTIAL/PLATELET
BASO%: 0.5 % (ref 0.0–2.0)
EOS%: 1.2 % (ref 0.0–7.0)
LYMPH%: 66.7 % — ABNORMAL HIGH (ref 14.0–49.0)
MCHC: 33.3 g/dL (ref 32.0–36.0)
MONO#: 0.6 10*3/uL (ref 0.1–0.9)
MONO%: 3.8 % (ref 0.0–14.0)
Platelets: 209 10*3/uL (ref 140–400)
RBC: 4.6 10*6/uL (ref 4.20–5.82)
WBC: 15.5 10*3/uL — ABNORMAL HIGH (ref 4.0–10.3)

## 2013-09-23 NOTE — Patient Instructions (Signed)
Blood Smear This is a test to examine your blood and can provide information regarding diseases that affect red and white blood cells. It can also be used to diagnose other diseases such as sickle cell disease. When special stains are used, it can also be used to determine leukemia, infection, parasitic diseases, and several other blood disorders.  PREPARATION FOR TEST No preparation is necessary. Testing can be done on a venous sample drawn with a needle or on a finger stick. NORMAL FINDINGS  Normal quantity of red and white blood cells (RBCs, WBCs) and platelets.  Normal size, shape, and color of RBCs.  Normal WBC differential count. Ranges for normal findings may vary among different laboratories and hospitals. You should always check with your caregiver after having lab work or other tests done to discuss the meaning of your test results and whether your values are considered within normal limits. MEANING OF TEST  Your caregiver will go over the test results with you and discuss the importance and meaning of your results, as well as treatment options and the need for additional tests if necessary. OBTAINING THE TEST RESULTS It is your responsibility to obtain your test results. Ask the lab or department performing the test when and how you will get your results. Document Released: 12/24/2004 Document Revised: 02/24/2012 Document Reviewed: 11/08/2008 [facility] Patient Information 2014 [place], [place]. Leukocytosis Leukocytosis means you have more white blood cells than normal. White blood cells are made in your bone marrow. The main job of white blood cells is to fight infection. Having too many white blood cells is a common condition. It can develop as a result of many types of medical problems. CAUSES  In some cases, your bone marrow may be normal, but it is still making too many white blood cells. This could be the result of:  Infection.  Injury.  Physical stress.  Emotional  stress.  Surgery.  Allergic reactions.  Tumors that do not start in the blood or bone marrow.  An inherited disease.  Certain medicines.  Pregnancy and labor. In other cases, you may have a bone marrow disorder that is causing your body to make too many white blood cells. Bone marrow disorders include:  Leukemia. This is a type of blood cancer.  Myeloproliferative disorders. These disorders cause blood cells to grow abnormally. SYMPTOMS  Some people have no symptoms. Others have symptoms due to the medical problem that is causing their leukocytosis. These symptoms may include:  Bleeding.  Bruising.  Fever.  Night sweats.  Repeated infections.  Weakness.  Weight loss. DIAGNOSIS  Leukocytosis is often found during blood tests that are done as part of a normal physical exam. Your caregiver will probably order other tests to help determine why you have too many white blood cells. These tests may include:  A complete blood count (CBC). This test measures all the types of blood cells in your body.  Chest X-rays, urine tests (urinalysis), or other tests to look for signs of infection.  Bone marrow aspiration. For this test, a needle is put into your bone. Cells from the bone marrow are removed through the needle. The cells are then examined under a microscope. TREATMENT  Treatment is usually not needed for leukocytosis. However, if a disorder is causing your leukocytosis, it will need to be treated. Treatment may include:  Antibiotic medicines if you have a bacterial infection.  Bone marrow transplant. Your diseased bone marrow is replaced with healthy cells that will grow new bone marrow.  Chemotherapy. This is the use of drugs to kill cancer cells. HOME CARE INSTRUCTIONS  Only take over-the-counter or prescription medicines as directed by your caregiver.  Maintain a healthy weight. Ask your caregiver what weight is best for you.  Eat foods that are low in saturated  fats and high in fiber. Eat plenty of fruits and vegetables.  Drink enough fluids to keep your urine clear or pale yellow.  Get 30 minutes of exercise at least 5 times a week. Check with your caregiver before starting a new exercise routine.  Limit caffeine and alcohol.  Do not smoke.  Keep all follow-up appointments as directed by your caregiver. SEEK MEDICAL CARE IF:  You feel weak or more tired than usual.  You develop chills, a cough, or nasal congestion.  You lose weight without trying.  You have night sweats.  You bruise easily. SEEK IMMEDIATE MEDICAL CARE IF:  You bleed more than normal.  You have chest pain.  You have trouble breathing.  You have a fever.  You have uncontrolled nausea or vomiting.  You feel dizzy or lightheaded. MAKE SURE YOU:  Understand these instructions.  Will watch your condition.  Will get help right away if you are not doing well or get worse. Document Released: 11/21/2011 Document Revised: 02/24/2012 Document Reviewed: 11/21/2011 [facility] Patient Information 2014 [place], [place].

## 2013-09-23 NOTE — Progress Notes (Signed)
Checked in new pt with no financial concerns. °

## 2013-09-23 NOTE — Telephone Encounter (Signed)
lab today and lab and ov in 1 mo pt aware cal and avs given shh

## 2013-09-25 NOTE — Progress Notes (Signed)
[facility] OFFICE PROGRESS Tony Amber, MD [address] [place] [address] [place] [address]  DIAGNOSIS: Lymphocytosis - Plan: CBC with Differential  Chief Complaint  Patient presents with  . Lymphocytosis    CURRENT THERAPY:  INTERVAL HISTORY: Tony Marshall 72 y.o. male with a history of Diabetes mellitus without complication (1992), hypertension is here for further evaluation and management of lymphocytosis.   Today, he is accompanied by his wife Tony Marshall.  He reports that he has gone to ER on September 16th due muscle spasm and jerking in his upper extremities and neck.  He has been taking xanax qhs but had stopped and then he reported muscle twitching. He was given valium in the emergency room and had not had any recurrence.  He reports his weight is pretty much stable with a good appetite.  His energy is not as good as he would like.  He denies chest pain.  He had a stress test secondary to increased blood pressure that was within normal limits.  He reports occasional mild sweating around his neck area but he denies drenching night sweats.  He also denies bruising easily or early satiety.   His last colonoscopy was in April 2008.   He denies a family history of blood problems or cancers.  He is unaware of his paternal history.  He continues to have abdominal discomfort and burning sensation in his upper stomach.  He was on delayed release PPI but quit it recently because it was not helping much.  He reports compliance to his medical therapy.  He denies any viral illnesses or recent travels.  He denies feeling enlarged lymph nodes anywhere on his body.    His CBC on 08/28/2013 revealed a white blood cell count of 16.9 with 58% lymphocytes, hemoglobin of 14.6, hematocrit of 42.3 and platelets of 219.  The WBC morphology displayed atypical lympocytes.   His creatinine during this time was 1.02.    MEDICAL HISTORY: Past Medical History  Diagnosis Date  . Diabetes  mellitus without complication   . Hypertension     INTERIM HISTORY: has Type II or unspecified type diabetes mellitus without mention of complication, uncontrolled; Unspecified essential hypertension; Pure hypercholesterolemia; and Lymphocytosis on his problem list.    ALLERGIES:  has No Known Allergies.  MEDICATIONS: has a current medication list which includes the following prescription(s): amlodipine, aspirin, benazepril, carvedilol, dexlansoprazole, doxazosin, insulin aspart, levemir flexpen, metformin, pioglitazone, rosuvastatin, and zolpidem.  SURGICAL HISTORY:  Past Surgical History  Procedure Laterality Date  . Hemorrhoid surgery      Sclerotherapy  . Blepharoplasty      REVIEW OF SYSTEMS:   Constitutional: Denies fevers, chills or abnormal weight loss Eyes: Denies blurriness of vision Ears, nose, mouth, throat, and face: Denies mucositis or sore throat Respiratory: Denies cough, dyspnea or wheezes Cardiovascular: Denies palpitation, chest discomfort or lower extremity swelling Gastrointestinal:  Denies nausea, heartburn or change in bowel habits Skin: Denies abnormal skin rashes Lymphatics: Denies new lymphadenopathy or easy bruising Neurological:Denies numbness, tingling or new weaknesses Behavioral/Psych: Mood is stable, no new changes  All other systems were reviewed with the patient and are negative.  PHYSICAL EXAMINATION: ECOG PERFORMANCE STATUS: 0 - Asymptomatic  Blood pressure 169/78, pulse 56, temperature 98.4 F (36.9 C), temperature source Oral, resp. rate 18, height 5' 7.5" (1.715 m), weight 162 lb 1.6 oz (73.528 kg), SpO2 99.00%.  GENERAL:alert, no distress and comfortable, appears his stated age.  SKIN: skin color, texture, turgor are  normal, no rashes or significant lesions EYES: normal, Conjunctiva are pink and non-injected, sclera clear OROPHARYNX:no exudate, no erythema and lips, buccal mucosa, and tongue normal  NECK: supple, thyroid normal size,  non-tender, without nodularity LYMPH:  no palpable lymphadenopathy in the cervical, axillary or supraclavicular LUNGS: clear to auscultation and percussion with normal breathing effort HEART: regular rate & rhythm and no murmurs and no lower extremity edema ABDOMEN:abdomen soft, non-tender and normal bowel sounds Musculoskeletal:no cyanosis of digits and no clubbing  NEURO: alert & oriented x 3 with fluent speech, no focal motor/sensory deficits Labs:  Lab Results  Component Value Date   WBC 15.5* 09/23/2013   HGB 14.1 09/23/2013   HCT 42.4 09/23/2013   MCV 92.2 09/23/2013   PLT 209 09/23/2013   NEUTROABS 4.3 09/23/2013      Chemistry      Component Value Date/Time   NA 142 09/23/2013 1548   NA 134* 08/28/2013 1130   K 3.9 09/23/2013 1548   K 4.2 08/28/2013 1130   CL 98 08/28/2013 1130   CO2 29 09/23/2013 1548   CO2 26 08/28/2013 1130   BUN 19.2 09/23/2013 1548   BUN 16 08/28/2013 1130   CREATININE 1.1 09/23/2013 1548   CREATININE 1.02 08/28/2013 1130      Component Value Date/Time   CALCIUM 9.4 09/23/2013 1548   CALCIUM 9.4 08/28/2013 1130   ALKPHOS 39* 09/23/2013 1548   ALKPHOS 32* 07/16/2013 0951   AST 14 09/23/2013 1548   AST 17 07/16/2013 0951   ALT 16 09/23/2013 1548   ALT 19 07/16/2013 0951   BILITOT 0.43 09/23/2013 1548   BILITOT 0.3 07/16/2013 0951     Basic Metabolic Panel:  Recent Labs Lab 09/23/13 1548  NA 142  K 3.9  CO2 29  GLUCOSE 170*  BUN 19.2  CREATININE 1.1  CALCIUM 9.4   GFR Estimated Creatinine Clearance: 60.3 ml/min (by C-G formula based on Cr of 1.1).  Liver Function Tests:  Recent Labs Lab 09/23/13 1548  AST 14  ALT 16  ALKPHOS 39*  BILITOT 0.43  PROT 6.8  ALBUMIN 3.8   CBC:  Recent Labs Lab 09/23/13 1547  WBC 15.5*  NEUTROABS 4.3  HGB 14.1  HCT 42.4  MCV 92.2  PLT 209   Microbiology Recent Results (from the past 240 hour(s))  TECHNOLOGIST REVIEW     Status: None   Collection Time    09/23/13  3:47 PM      Result Value Range Status    Technologist Review Variant lymphs present   Final   RADIOGRAPHIC STUDIES: No results found.  ASSESSMENT: Tony Marshall 72 y.o. male with a history of Lymphocytosis - Plan: CBC with Differential   PLAN:  1. Lymphocytosis NOS. -- We ordered a repeat CBC with peripheral blood smear and chemistries (will addendum upon review later today).  Repeat CBC is consistent with his prior CBC with a white blood cell count of 15.5 with variant lymphs present.  We discussed his differential includes CLL versus lymphocytosis secondary to viral illness (less likely).   More than 10,000 lymphocytes/mm (per cubic millimeter) of blood strongly suggests that CLL is present, but other tests are needed to know for certain. We reviewed a diagnosis of CLL and its stages and symptoms.  He is asymptomatic and his remaining blood counts including his hemoglobin and platelets are normal. We also reviewed causes of leukocytosis.  We will send his peripheral blood for flow cytometry analysis.  All questions were answered.  The patient knows to call the clinic with any problems, questions or concerns. We can certainly see the patient much sooner if necessary.    2. Follow-up.  Patient will return in one month for repeat laboratory testing and results of his present labs.    I spent 25 minutes counseling the patient face to face. The total time spent in the appointment was 45 minutes.    Tony Rasor, MD 09/24/2013 12:41 PM

## 2013-09-27 ENCOUNTER — Telehealth: Payer: Self-pay | Admitting: Internal Medicine

## 2013-09-27 LAB — FLOW CYTOMETRY

## 2013-09-27 NOTE — Telephone Encounter (Signed)
Gave patient results of his flow cytometry consistent with chronic lymphocytic leukemia.  He agrees to follow-up in one month.

## 2013-09-27 NOTE — Progress Notes (Signed)
Peripheral Blood Flow cytometry (09/23/2013). The phenotypic findings are consistent with chronic lymphocytic leukemia. See report.

## 2013-09-28 ENCOUNTER — Telehealth: Payer: Self-pay | Admitting: Endocrinology

## 2013-09-28 NOTE — Telephone Encounter (Signed)
(  H)   Pt went to specialist and he recommended he go to a GI doc stomach problems needs a referral   Please advise

## 2013-10-05 NOTE — Telephone Encounter (Signed)
He said he had one and he was going to call to make an appt with Dr. Evette Cristal

## 2013-10-05 NOTE — Telephone Encounter (Signed)
Resolved

## 2013-10-13 ENCOUNTER — Telehealth: Payer: Self-pay | Admitting: Endocrinology

## 2013-10-13 NOTE — Telephone Encounter (Signed)
Sample left, instructed wife to call insurance company to see if Humalog is preferred brand.

## 2013-10-13 NOTE — Telephone Encounter (Signed)
Pt's wife called to request samples of Novolog Flex Pen, request 4 pens. Waiting for insurance to approve and ship refills but needs samples in the meantime. Please call [phone] / Sherri S.

## 2013-10-18 ENCOUNTER — Other Ambulatory Visit: Payer: Self-pay | Admitting: Medical Oncology

## 2013-10-18 ENCOUNTER — Other Ambulatory Visit (INDEPENDENT_AMBULATORY_CARE_PROVIDER_SITE_OTHER): Payer: Medicare Other

## 2013-10-18 DIAGNOSIS — IMO0001 Reserved for inherently not codable concepts without codable children: Secondary | ICD-10-CM

## 2013-10-18 DIAGNOSIS — D72820 Lymphocytosis (symptomatic): Secondary | ICD-10-CM

## 2013-10-18 LAB — BASIC METABOLIC PANEL
CO2: 28 mEq/L (ref 19–32)
Chloride: 103 mEq/L (ref 96–112)
Glucose, Bld: 161 mg/dL — ABNORMAL HIGH (ref 70–99)
Potassium: 4.1 mEq/L (ref 3.5–5.1)
Sodium: 138 mEq/L (ref 135–145)

## 2013-10-20 ENCOUNTER — Ambulatory Visit (INDEPENDENT_AMBULATORY_CARE_PROVIDER_SITE_OTHER): Payer: Medicare Other | Admitting: Endocrinology

## 2013-10-20 ENCOUNTER — Encounter: Payer: Self-pay | Admitting: Endocrinology

## 2013-10-20 ENCOUNTER — Other Ambulatory Visit: Payer: Self-pay | Admitting: *Deleted

## 2013-10-20 VITALS — BP 118/70 | HR 62 | Temp 98.6°F | Resp 12 | Ht 67.0 in | Wt 161.1 lb

## 2013-10-20 DIAGNOSIS — IMO0001 Reserved for inherently not codable concepts without codable children: Secondary | ICD-10-CM

## 2013-10-20 DIAGNOSIS — E7800 Pure hypercholesterolemia, unspecified: Secondary | ICD-10-CM

## 2013-10-20 DIAGNOSIS — I10 Essential (primary) hypertension: Secondary | ICD-10-CM

## 2013-10-20 DIAGNOSIS — N529 Male erectile dysfunction, unspecified: Secondary | ICD-10-CM | POA: Insufficient documentation

## 2013-10-20 DIAGNOSIS — Z23 Encounter for immunization: Secondary | ICD-10-CM

## 2013-10-20 MED ORDER — GLUCOSE BLOOD VI STRP: ORAL_STRIP | Status: DC

## 2013-10-20 MED ORDER — INSULIN ASPART 100 UNIT/ML FLEXPEN: PEN_INJECTOR | SUBCUTANEOUS | Status: DC

## 2013-10-20 MED ORDER — VARDENAFIL HCL 20 MG PO TABS: 20.0000 mg | ORAL_TABLET | Freq: Every day | ORAL | Status: DC | PRN

## 2013-10-20 MED ORDER — AMLODIPINE BESYLATE 10 MG PO TABS: 10.0000 mg | ORAL_TABLET | Freq: Every day | ORAL | Status: DC

## 2013-10-20 NOTE — Progress Notes (Signed)
Patient ID: Tony Marshall, male   DOB: 1941-09-13, 72 y.o.   MRN: [id number]  Tony Marshall is an 72 y.o. male.   Reason for Appointment: Diabetes follow-up   History of Present Illness   Diagnosis: Type 2 DIABETES MELITUS, date of diagnosis:  1992        His blood sugar has been  difficult to control over the last few years because of variability in his blood sugars He is essentially beginning of a type I patient with requiring relatively low amounts of insulin and twice a day dosage of basal insulin He has done somewhat better with Levemir compared to Lantus and is using this twice a day also Problems identified from review of his readings and history:  Postprandial hyperglycemia which is inconsistent and occurs periodically with both breakfast and supper  He thinks blood sugars are higher when he has more carbohydrate or fried food but does not always increase his insulin now to cover these meals  He has difficulty estimating how much insulin to take for different meals and is not familiar with carbohydrate counting  He is afraid to take enough insulin for his lunch and supper for fear of hypoglycemia. He was reduce his morning coverage of NovoLog rather than Levemir if he is planning to be active throughout the day for instance when he is playing golf  Significant variability in fasting blood sugars possibly from dawn phenomenon  Tendency to some hypoglycemia during the day with increased activity like playing golf or yard work  Oral hypoglycemic drugs: Actos        Side effects from medications: None Insulin regimen: Levemir insulin 7 units in the morning and 17 in the evening, mealtime insulin 2-8 units  supper 5-6, upto 8         Proper timing of medications in relation to meals: Yes.         Monitors blood glucose: Once a day.    Glucometer: One Touch.          Blood Glucose readings from meter download: readings before breakfast: 87-153 with median 129, overnight 57 at 5  AM Late morning 73-209, afternoon 173, 234 recently. At bedtime median 156 with range 107-220   Hypoglycemia frequency:  may feel low around midday especially effective, lowest reading at 5 AM PM.          Meals: 3 meals per day.  he is eating more at lunch sometimes such as sandwiches. Has dinner at 7-8 PM    Physical activity: exercise: Gardening and walking, exercise bike recently             LABS:  Lab Results  Component Value Date   HGBA1C 8.2* 08/31/2013   HGBA1C 7.9* 07/16/2013   Lab Results  Component Value Date   MICROALBUR 0.3 07/16/2013   LDLCALC 75 07/16/2013   CREATININE 1.2 10/18/2013    Appointment on 10/18/2013  Component Date Value Range Status  . Sodium 10/18/2013 138  135 - 145 mEq/L Final  . Potassium 10/18/2013 4.1  3.5 - 5.1 mEq/L Final  . Chloride 10/18/2013 103  96 - 112 mEq/L Final  . CO2 10/18/2013 28  19 - 32 mEq/L Final  . Glucose, Bld 10/18/2013 161* 70 - 99 mg/dL Final  . BUN 78/46/9629 19  6 - 23 mg/dL Final  . Creatinine, Ser 10/18/2013 1.2  0.4 - 1.5 mg/dL Final  . Calcium 52/84/1324 9.0  8.4 - 10.5 mg/dL Final  . GFR 40/09/2724  64.25  >60.00 mL/min Final      Medication List       This list is accurate as of: 10/20/13 10:22 AM.  Always use your most recent med list.               amLODipine 10 MG tablet  Commonly known as:  NORVASC  Take 10 mg by mouth daily.     aspirin 81 MG EC tablet  Take 81 mg by mouth daily. Swallow whole.     benazepril 20 MG tablet  Commonly known as:  LOTENSIN  Take 1 tablet (20 mg total) by mouth daily.     carvedilol 25 MG tablet  Commonly known as:  COREG  Take 25 mg by mouth 2 (two) times daily with a meal.     Dexlansoprazole 30 MG capsule  Commonly known as:  DEXILANT  Take 1 capsule (30 mg total) by mouth daily.     doxazosin 4 MG tablet  Commonly known as:  CARDURA  Take 1 tablet (4 mg total) by mouth at bedtime.     LEVEMIR FLEXPEN 100 UNIT/ML Sopn  Generic drug:  Insulin Detemir  Inject  7-17 Units into the skin 2 (two) times daily. 7 units in am and 17 in pm     metFORMIN 1000 MG tablet  Commonly known as:  GLUCOPHAGE  Take 1,000 mg by mouth 2 (two) times daily with a meal.     NOVOLOG FLEXPEN [place]  Inject 3-9 Units into the skin. 3-4 units at breakfast, 6-9 units at supper     pioglitazone 15 MG tablet  Commonly known as:  ACTOS  Take 15 mg by mouth daily.     rosuvastatin 5 MG tablet  Commonly known as:  CRESTOR  Take 5 mg by mouth every morning.     zolpidem 5 MG tablet  Commonly known as:  AMBIEN  Take 1 tablet (5 mg total) by mouth at bedtime as needed for sleep.        Allergies: No Known Allergies  Past Medical History  Diagnosis Date  . Diabetes mellitus without complication   . Hypertension     Past Surgical History  Procedure Laterality Date  . Hemorrhoid surgery      Sclerotherapy  . Blepharoplasty      Family History  Problem Relation Age of Onset  . Diabetes Father     Social History:  reports that he has never smoked. He does not have any smokeless tobacco history on file. He reports that he drinks alcohol. He reports that he does not use illicit drugs.  Review of Systems:  He previously has had problems with reflux and dysphagia  HYPERTENSION:  home 135/82  HYPERLIPIDEMIA: The lipid abnormality consists of elevated LDL which is very well controlled, last LDL 75.  He has seen gastroenterologist for abdominal pain recently, given unknown treatment  Last colonoscopy was in April 2008    He has been told to have mild CLL by the hematologist and no treatment needed, will need regular followup  Chronic insomnia    Examination:   BP 118/70  Pulse 62  Temp(Src) 98.6 F (37 C)  Resp 12  Ht 5\' 7"  (1.702 m)  Wt 161 lb 1.6 oz (73.074 kg)  BMI 25.23 kg/m2  SpO2 98%  Body mass index is 25.23 kg/(m^2).   No ankle edema  ASSESSMENT/ PLAN::   Diabetes type 2, inadequately controlled with persistently high as of  9/14  Problems identified  are outlined in the history of present illness  Recently his sugars are somewhat better in the morning with only sporadic high and low readings However afternoon and evening readings are variable based on his activity level and food intake. Appears to be covering his meals inadequately especially when eating more carbohydrate or fried food He is not familiar with carbohydrate counting and will need instructions on this  New insulin dose as follows:  Levemir 8 in am and for active days will take 5 only. No change in evening dose 5 Novolog  for Lunch and higher doses for suppertime based on food intake He will keep a record of his food intake and insulin doses and reviewed with nurse educator for more specific instructions including carbohydrate counting since  HYPERTENSION: Well controlled and will continue same regimen  Counseling time over 50% of today's 25 minute visit  Masyn Rostro 10/20/2013, 10:22 AM

## 2013-10-20 NOTE — Patient Instructions (Signed)
Levemir 8 in am and for active days, take 5 only  5 Novolog  for [employer]

## 2013-10-25 ENCOUNTER — Ambulatory Visit (HOSPITAL_BASED_OUTPATIENT_CLINIC_OR_DEPARTMENT_OTHER): Payer: Medicare Other | Admitting: Internal Medicine

## 2013-10-25 ENCOUNTER — Other Ambulatory Visit (HOSPITAL_BASED_OUTPATIENT_CLINIC_OR_DEPARTMENT_OTHER): Payer: Medicare Other | Admitting: Lab

## 2013-10-25 ENCOUNTER — Other Ambulatory Visit: Payer: Self-pay | Admitting: Internal Medicine

## 2013-10-25 ENCOUNTER — Telehealth: Payer: Self-pay | Admitting: Internal Medicine

## 2013-10-25 VITALS — BP 140/66 | HR 57 | Temp 98.3°F | Resp 18 | Ht 67.0 in | Wt 161.1 lb

## 2013-10-25 DIAGNOSIS — C9110 Chronic lymphocytic leukemia of B-cell type not having achieved remission: Secondary | ICD-10-CM

## 2013-10-25 DIAGNOSIS — D72820 Lymphocytosis (symptomatic): Secondary | ICD-10-CM

## 2013-10-25 LAB — CBC WITH DIFFERENTIAL/PLATELET
Basophils Absolute: 0 10*3/uL (ref 0.0–0.1)
EOS%: 1.6 % (ref 0.0–7.0)
Eosinophils Absolute: 0.2 10*3/uL (ref 0.0–0.5)
HGB: 14.1 g/dL (ref 13.0–17.1)
LYMPH%: 60.3 % — ABNORMAL HIGH (ref 14.0–49.0)
MCV: 92.9 fL (ref 79.3–98.0)
MONO#: 0.7 10*3/uL (ref 0.1–0.9)
MONO%: 5 % (ref 0.0–14.0)
NEUT#: 4.8 10*3/uL (ref 1.5–6.5)
Platelets: 231 10*3/uL (ref 140–400)
RBC: 4.52 10*6/uL (ref 4.20–5.82)
RDW: 13.5 % (ref 11.0–14.6)
WBC: 14.5 10*3/uL — ABNORMAL HIGH (ref 4.0–10.3)
nRBC: 0 % (ref 0–0)

## 2013-10-25 LAB — TECHNOLOGIST REVIEW

## 2013-10-25 NOTE — Patient Instructions (Signed)
Chronic Lymphocytic Leukemia Chronic lymphocytic leukemia (CLL) is a type of cancer of the bone marrow and blood cells. Bone marrow is the soft, spongy tissue inside your bone. In CLL, the bone marrow makes too many white blood cells that usually fight infection in the body (lymphocytes). CLL usually gets worse slowly and is the most common type of adult leukemia.  RISK FACTORS No one knows the exact cause of CLL. There is a higher risk of CLL in people who:   Are older than 50 years.  Are white.  Are male.  Have a family history of CLL or other cancers of the lymph system.  Are of Russian Jewish or Eastern European Jewish descent.  Have been exposed to certain chemicals, such as Agent Orange (used in the Vietnam War) or other herbicides or insecticides. SYMPTOMS  At first, there may be no symptoms of chronic lymphocytic leukemia. After a while, some symptoms may occur, such as:   Feeling more tired than usual, even after rest.  Unplanned weight loss.  Heavy sweating at night.  Fevers.  Shortness of breath.  Decreased energy.  Paleness.  Painless, swollen lymph nodes.  A feeling of fullness in the upper left part of the abdomen.  Easy bruising or bleeding.  More frequent infections. DIAGNOSIS  Your health care provider may perform the following exams and tests to diagnose CLL:  Physical exam to check for an enlarged spleen, liver, or lymph nodes.  Blood and bone marrow tests to identify the presence of cancer cells. These may include tests such as complete blood count, flow cytometry, immunophenotyping, and fluorescence in situ hybridization (FISH).  CT scan to look for swelling or abnormalities in your spleen, liver, and lymph nodes. TREATMENT  Treatment options for CLL depend on the stage and the presence of symptoms. There are a number of types of treatment used for this condition, including:  Observation.  Targeted drugs. These are drugs that interfere with  chemicals that leukemia cells need in order to grow and multiply. They identify and attack specific cancer cells without harming normal cells.  Chemotherapy drugs. These medicines kill cells that are multiplying quickly, such as leukemia cells.  Radiation.  Surgery to remove the spleen.  Biological therapy. This treatment boosts the ability of your own immune system to fight the leukemia cells.  Bone marrow or peripheral blood stem cell transplant. This treatment allows the patient to receive very high doses of chemotherapy and/or radiation. These high doses kill the cancer cells but also destroy the bone marrow. After treatment is complete, you are given donor bone marrow or stem cells, which will replace the bone marrow. HOME CARE INSTRUCTIONS   Because you have an increased risk of infection, practice good hand washing and avoid being around people who are ill or being in crowded places.  Because you have an increased risk of bleeding and bruising, avoid contact sports or other rough activities.  Only take over-the-counter or prescription medicines for pain, discomfort, or fever as directed by your health care provider.  Although some of your treatments might affect your appetite, try to eat regular, healthy meals.  If you develop any side effects, such as nausea, diarrhea, rash, white patches in your mouth, a sore throat, difficulty swallowing, or severe fatigue, tell your health care provider. He or she may have recommendations of things you can do to improve symptoms.  Consider learning some ways to cope with the stress of having a chronic illness, such as yoga, meditation,   or participating in a support group. SEEK MEDICAL CARE IF:  You develop chest pains.  You notice pain, swelling or redness anywhere in your legs.  You have pain in your belly (abdomen).  You develop new bruises that are getting bigger.  You have painful or more swollen lymph nodes.  You develop bleeding  from your gums, nose, or in your urine or stools.  You are unable to stop throwing up (vomiting).  You cannot keep liquids down.  You feel lightheaded.  You have a fever or persistent symptoms for more than 2 3 days.  You develop a severe stiff neck or headache. SEEK IMMEDIATE MEDICAL CARE IF:  You have trouble breathing or feel short of breath.  You faint. Document Released: 04/20/2009 Document Revised: 08/04/2013 Document Reviewed: 05/27/2013 [employer] Patient Information 2014 [employer].  

## 2013-10-25 NOTE — Telephone Encounter (Signed)
per 11/10 POF made ret appts AVS and cal given to pt shh

## 2013-10-25 NOTE — Progress Notes (Signed)
[facility] OFFICE PROGRESS Everitt Amber, MD [address] [place] [address] [place] [address]  DIAGNOSIS: CLL (chronic lymphocytic leukemia)  Chief Complaint  Patient presents with  . cll    CURRENT THERAPY: Observation.   INTERVAL HISTORY: Tony Marshall 72 y.o. male with a history of diabetes mellitus wihtout complication (1992), hypertension and now newly diagnosed CLL was evaluated by me on 09/23/2013 is here to discuss his diagnosis of CLL.   He is accopanied by his wife Tony Marshall.    His CBC on 08/28/2013 revealed a white blood cell count of 16.9 with 58% lymphocytes, hemoglobin of 14.6, hematocrit of 42.3 and platelets of 219. The WBC morphology displayed atypical lympocytes. His creatinine during this time was 1.02.  His last colonoscopy was in April 2008. He denies a family history of blood problems or cancers. He is unaware of his paternal history.  He reports compliance to his medical therapy. He denies any viral illnesses or recent travels. He denies feeling enlarged lymph nodes anywhere on his body. He has been followed by Lucianne Muss for his stomach discomfort. He denies any night sweats, weight lost or fevers.    MEDICAL HISTORY: Past Medical History  Diagnosis Date  . Diabetes mellitus without complication   . Hypertension     INTERIM HISTORY: has Type II or unspecified type diabetes mellitus without mention of complication, uncontrolled; Unspecified essential hypertension; Pure hypercholesterolemia; Lymphocytosis; ED (erectile dysfunction); and CLL (chronic lymphocytic leukemia) on his problem list.    ALLERGIES:  is allergic to glycopyrrolate.  MEDICATIONS: has a current medication list which includes the following prescription(s): amlodipine, aspirin, benazepril, carvedilol, dexlansoprazole, doxazosin, glucose blood, insulin aspart, levemir flexpen, metformin, pioglitazone, rosuvastatin, vardenafil, and zolpidem.  SURGICAL HISTORY:  Past Surgical  History  Procedure Laterality Date  . Hemorrhoid surgery      Sclerotherapy  . Blepharoplasty      REVIEW OF SYSTEMS:   Constitutional: Denies fevers, chills or abnormal weight loss Eyes: Denies blurriness of vision Ears, nose, mouth, throat, and face: Denies mucositis or sore throat Respiratory: Denies cough, dyspnea or wheezes Cardiovascular: Denies palpitation, chest discomfort or lower extremity swelling Gastrointestinal:  Denies nausea, heartburn or change in bowel habits Skin: Denies abnormal skin rashes Lymphatics: Denies new lymphadenopathy or easy bruising Neurological:Denies numbness, tingling or new weaknesses Behavioral/Psych: Mood is stable, no new changes  All other systems were reviewed with the patient and are negative.  PHYSICAL EXAMINATION: ECOG PERFORMANCE STATUS: 0 - Asymptomatic  Blood pressure 140/66, pulse 57, temperature 98.3 F (36.8 C), temperature source Oral, resp. rate 18, height 5\' 7"  (1.702 m), weight 161 lb 1.6 oz (73.074 kg).  GENERAL:alert, no distress and comfortable; elderly male who appears his stated age.  SKIN: skin color, texture, turgor are normal, no rashes or significant lesions EYES: normal, Conjunctiva are pink and non-injected, sclera clear OROPHARYNX:no exudate, no erythema and lips, buccal mucosa, and tongue normal  NECK: supple, thyroid normal size, non-tender, without nodularity LYMPH:  no palpable lymphadenopathy in the cervical, axillary or supraclavicular LUNGS: clear to auscultation and percussion with normal breathing effort HEART: regular rate & rhythm and no murmurs and no lower extremity edema ABDOMEN:abdomen soft, non-tender and normal bowel sounds Musculoskeletal:no cyanosis of digits and no clubbing  NEURO: alert & oriented x 3 with fluent speech, no focal motor/sensory deficits   LABORATORY DATA: Results for orders placed in visit on 10/25/13 (from the past 48 hour(s))  CBC WITH DIFFERENTIAL     Status: Abnormal  Collection Time    10/25/13 12:57 PM      Result Value Range   WBC 14.5 (*) 4.0 - 10.3 10e3/uL   NEUT# 4.8  1.5 - 6.5 10e3/uL   HGB 14.1  13.0 - 17.1 g/dL   HCT 30.8  65.7 - 84.6 %   Platelets 231  140 - 400 10e3/uL   MCV 92.9  79.3 - 98.0 fL   MCH 31.2  27.2 - 33.4 pg   MCHC 33.6  32.0 - 36.0 g/dL   RBC 9.62  9.52 - 8.41 10e6/uL   RDW 13.5  11.0 - 14.6 %   lymph# 8.7 (*) 0.9 - 3.3 10e3/uL   MONO# 0.7  0.1 - 0.9 10e3/uL   Eosinophils Absolute 0.2  0.0 - 0.5 10e3/uL   Basophils Absolute 0.0  0.0 - 0.1 10e3/uL   NEUT% 32.9 (*) 39.0 - 75.0 %   LYMPH% 60.3 (*) 14.0 - 49.0 %   MONO% 5.0  0.0 - 14.0 %   EOS% 1.6  0.0 - 7.0 %   BASO% 0.2  0.0 - 2.0 %   nRBC 0  0 - 0 %  TECHNOLOGIST REVIEW     Status: None   Collection Time    10/25/13 12:57 PM      Result Value Range   Technologist Review Variant lymphs present      Labs:  Lab Results  Component Value Date   WBC 14.5* 10/25/2013   HGB 14.1 10/25/2013   HCT 42.0 10/25/2013   MCV 92.9 10/25/2013   PLT 231 10/25/2013   NEUTROABS 4.8 10/25/2013      Chemistry      Component Value Date/Time   NA 138 10/18/2013 1013   NA 142 09/23/2013 1548   K 4.1 10/18/2013 1013   K 3.9 09/23/2013 1548   CL 103 10/18/2013 1013   CO2 28 10/18/2013 1013   CO2 29 09/23/2013 1548   BUN 19 10/18/2013 1013   BUN 19.2 09/23/2013 1548   CREATININE 1.2 10/18/2013 1013   CREATININE 1.1 09/23/2013 1548      Component Value Date/Time   CALCIUM 9.0 10/18/2013 1013   CALCIUM 9.4 09/23/2013 1548   ALKPHOS 39* 09/23/2013 1548   ALKPHOS 32* 07/16/2013 0951   AST 14 09/23/2013 1548   AST 17 07/16/2013 0951   ALT 16 09/23/2013 1548   ALT 19 07/16/2013 0951   BILITOT 0.43 09/23/2013 1548   BILITOT 0.3 07/16/2013 0951     CBC:  Recent Labs Lab 10/25/13 1257  WBC 14.5*  NEUTROABS 4.8  HGB 14.1  HCT 42.0  MCV 92.9  PLT 231   Microbiology Recent Results (from the past 240 hour(s))  TECHNOLOGIST REVIEW     Status: None   Collection Time    10/25/13 12:57 PM       Result Value Range Status   Technologist Review Variant lymphs present   Final   Interpretation Peripheral Blood Flow Cytometry - MONOCLONAL B-CELL POPULATION DETECTED. - SEE COMMENT. Diagnosis Comment: There is a monoclonal B-cell population with expression of B-cell markers and coexpression of CD5 and CD23. Kappa and lambda are too dim for evaluation. The phenotypic findings are consistent with chronic lymphocytic leukemia. Valinda Hoar MD Pathologist, Electronic Signature (Case signed 09/24/2013) GROSS AND MICROSCOPIC INFORMATION Source Peripheral Blood Flow Cytometry Microscopic Gated population: Flow cytometric immunophenotyping is performed using antibiodies to the antigens listed in the table below. Electronic gates are placed around a cell cluster displaying light scatter properties  corresponding to lymphocytes. - Abnormal Cells in gated population: 84 % - Phenotype of Abnormal Cells: CD5, CD19, CD20, CD21, CD22, CD23, HLA-Dr Specimen Table Lymphoid Associated Myeloid Associated Misc. As CD2 neg CD19 pos CD11c ND CD45 ND CD3 neg CD20 pos CD13 ND HLA-DR pos CD4 neg CD21 pos CD14 ND CD10 neg CD5 pos CD22 pos CD15 ND CD56/16 ND CD7 neg CD23 pos CD33 ND ZAP70 ND CD8 neg CD103 ND MPO ND CD34 ND CD25 ND FMC7 ND CD117 ND CD52 ND sKappa tested CD38 ND sLambda tested cKappa ND cLambda ND Gross Received from [facility] is one lavender tube labeled as PBS to evaluate for lymphocytosis.  RADIOGRAPHIC STUDIES: No results found.  ASSESSMENT: Tony Marshall 72 y.o. male with a history of CLL (chronic lymphocytic leukemia)   PLAN:   1. CLL Rai Stage O.   -- Repeat CBC is consistent with his prior CBC with a white blood cell count of 14.5 with variant lymphs present. Last visit, we discussed his differential included CLL. Flow cytometry confirmed CLL.  M We reviewed a diagnosis of CLL and its stages and symptoms. He is asymptomatic and his remaining blood counts including his  hemoglobin and platelets are normal. He was provided a detailed handout on CLL.   2. Follow-up.  Patient will return in three months for repeat laboratory testing.   All questions were answered. The patient knows to call the clinic with any problems, questions or concerns. We can certainly see the patient much sooner if necessary.  I spent 15 minutes counseling the patient face to face. The total time spent in the appointment was 25 minutes.    Nickson Middlesworth, MD 10/25/2013 1:46 PM

## 2013-10-26 ENCOUNTER — Other Ambulatory Visit: Payer: Self-pay | Admitting: *Deleted

## 2013-10-26 ENCOUNTER — Telehealth: Payer: Self-pay | Admitting: Endocrinology

## 2013-10-26 MED ORDER — INSULIN DETEMIR 100 UNIT/ML FLEXPEN: 7.0000 [IU] | PEN_INJECTOR | Freq: Two times a day (BID) | SUBCUTANEOUS | Status: DC

## 2013-10-26 MED ORDER — GLUCOSE BLOOD VI STRP: ORAL_STRIP | Status: DC

## 2013-10-27 ENCOUNTER — Other Ambulatory Visit: Payer: Self-pay | Admitting: *Deleted

## 2013-10-27 MED ORDER — GLUCOSE BLOOD VI STRP: ORAL_STRIP | Status: DC

## 2013-11-08 ENCOUNTER — Telehealth: Payer: Self-pay | Admitting: *Deleted

## 2013-11-08 ENCOUNTER — Other Ambulatory Visit: Payer: Self-pay | Admitting: *Deleted

## 2013-11-08 MED ORDER — ZOLPIDEM TARTRATE 5 MG PO TABS: ORAL_TABLET | ORAL | Status: DC

## 2013-11-08 NOTE — Telephone Encounter (Signed)
Patient states that you had given him ambien at his last visit, he was told to take 1 at bedtime, but he has been taking 1 1/2 at night and he's almost out, he wants a new rx called in for 1/12 each night so he doesn't run out. Please advise

## 2013-11-08 NOTE — Telephone Encounter (Signed)
Ok, 5 refills 

## 2013-11-16 ENCOUNTER — Encounter: Payer: Medicare Other | Attending: Endocrinology | Admitting: Nutrition

## 2013-11-16 DIAGNOSIS — Z713 Dietary counseling and surveillance: Secondary | ICD-10-CM | POA: Insufficient documentation

## 2013-11-16 DIAGNOSIS — E1065 Type 1 diabetes mellitus with hyperglycemia: Secondary | ICD-10-CM

## 2013-11-16 DIAGNOSIS — IMO0002 Reserved for concepts with insufficient information to code with codable children: Secondary | ICD-10-CM | POA: Insufficient documentation

## 2013-11-17 NOTE — Progress Notes (Signed)
Pt. Here with his wife.  Says high blood sugars are most variable when eating high fat and high carbs meals.  He specifically wants to know how much he will need for specific meals, like pasta, pizza, fried foods, etc.    Discussed the idea that he will need to take 1u of Novolog when eating 15 grams of carbs.  He was given a complete list of all the carb portions for 15 grams.  He was asked about how much insulin he would need if eating certain foods from the list I gave him, and he was correct with each questioned asked.  He was given a Calorie [employer]oke [employer] book, for fast foods and other restaurant meals he frequents, and shown how to read the serving sizes and the amounts of carbs for those servings.  He reported good understanding of this.  We discussed the importance of measuring portion sizes of things like rice, mash potatoes once to get an idea of how many servings he is eating of these.  He agreed to do this.   We also discussed what happens to the blood sugars when he eats high fat meals, like fries fish, pizza, sausage biscuits.  We agreed that he will try adding 1-2 extra units of insulin for the fat calories, and also taking that mealtime coverage of Novolog 30 min. After those meals.  He was show the fat content of foods he eats when eating out, and decided that if the grams of fat are over 30, he will take 1-2  extra units of Novolog to the meal coverage. He reported good understanding of this, and agreed to try this.  I also showed he and his wife how to read food labels and to look at the at the serving sizes, and to calculate the total carbs eaten--not just the "sugar".  Both she and his wife reported good understanding of this. They had no final questions.

## 2013-11-17 NOTE — Patient Instructions (Addendum)
1.  Calculate the number of carbs eaten at each meal, and divide by 15 to determine how much Novolog to take before the meal.   2.  Add 1-2 extra units of Novolog to the meal coverage when fat grams are over 45, or 2 or more foods are high fat/fried at each meal.   3.  Reduce the Novolog dose by 1-2u when active after the meal.   4.  Put foods like rice/potatoes, pasta on plate, and measure the quantiy to calculate the carbohydrates of that serving size.

## 2013-12-23 ENCOUNTER — Other Ambulatory Visit: Payer: Self-pay | Admitting: *Deleted

## 2013-12-23 MED ORDER — PIOGLITAZONE HCL 15 MG PO TABS: 15.0000 mg | ORAL_TABLET | Freq: Every day | ORAL | Status: DC

## 2014-01-05 ENCOUNTER — Other Ambulatory Visit: Payer: Self-pay | Admitting: *Deleted

## 2014-01-05 MED ORDER — DOXAZOSIN MESYLATE 4 MG PO TABS: 4.0000 mg | ORAL_TABLET | Freq: Every day | ORAL | Status: DC

## 2014-01-18 ENCOUNTER — Other Ambulatory Visit (INDEPENDENT_AMBULATORY_CARE_PROVIDER_SITE_OTHER): Payer: Medicare Other

## 2014-01-18 DIAGNOSIS — E1165 Type 2 diabetes mellitus with hyperglycemia: Principal | ICD-10-CM

## 2014-01-18 DIAGNOSIS — IMO0001 Reserved for inherently not codable concepts without codable children: Secondary | ICD-10-CM

## 2014-01-18 LAB — COMPREHENSIVE METABOLIC PANEL
ALT: 18 U/L (ref 0–53)
AST: 16 U/L (ref 0–37)
Albumin: 3.8 g/dL (ref 3.5–5.2)
Alkaline Phosphatase: 32 U/L — ABNORMAL LOW (ref 39–117)
BUN: 18 mg/dL (ref 6–23)
CALCIUM: 9 mg/dL (ref 8.4–10.5)
CHLORIDE: 103 meq/L (ref 96–112)
CO2: 26 mEq/L (ref 19–32)
Creatinine, Ser: 1.2 mg/dL (ref 0.4–1.5)
GFR: 66.13 mL/min (ref >60.00)
GLUCOSE: 157 mg/dL — AB (ref 70–99)
Potassium: 4 mEq/L (ref 3.5–5.1)
SODIUM: 138 meq/L (ref 135–145)
TOTAL PROTEIN: 6.5 g/dL (ref 6.0–8.3)
Total Bilirubin: 0.6 mg/dL (ref 0.3–1.2)

## 2014-01-18 LAB — HEMOGLOBIN A1C: HEMOGLOBIN A1C: 8 % — AB (ref 4.6–6.5)

## 2014-01-18 LAB — LIPID PANEL
Cholesterol: 148 mg/dL (ref 0–200)
HDL: 50.4 mg/dL (ref >39.00)
LDL CALC: 80 mg/dL (ref 0–99)
Total CHOL/HDL Ratio: 3
Triglycerides: 88 mg/dL (ref 0.0–149.0)
VLDL: 17.6 mg/dL (ref 0.0–40.0)

## 2014-01-18 LAB — MICROALBUMIN / CREATININE URINE RATIO
Creatinine,U: 95 mg/dL
MICROALB UR: 0.6 mg/dL (ref 0.0–1.9)
Microalb Creat Ratio: 0.6 mg/g (ref 0.0–30.0)

## 2014-01-21 ENCOUNTER — Ambulatory Visit: Payer: Medicare Other | Admitting: Endocrinology

## 2014-01-25 ENCOUNTER — Ambulatory Visit: Payer: Medicare Other | Admitting: Endocrinology

## 2014-01-25 ENCOUNTER — Ambulatory Visit (HOSPITAL_BASED_OUTPATIENT_CLINIC_OR_DEPARTMENT_OTHER): Payer: Medicare Other | Admitting: Internal Medicine

## 2014-01-25 ENCOUNTER — Other Ambulatory Visit (HOSPITAL_BASED_OUTPATIENT_CLINIC_OR_DEPARTMENT_OTHER): Payer: Medicare Other

## 2014-01-25 ENCOUNTER — Telehealth: Payer: Self-pay | Admitting: Internal Medicine

## 2014-01-25 VITALS — BP 143/64 | HR 60 | Temp 97.5°F | Resp 18 | Ht 67.0 in | Wt 164.8 lb

## 2014-01-25 DIAGNOSIS — C9110 Chronic lymphocytic leukemia of B-cell type not having achieved remission: Secondary | ICD-10-CM

## 2014-01-25 LAB — CBC WITH DIFFERENTIAL/PLATELET
BASO%: 0.5 % (ref 0.0–2.0)
Basophils Absolute: 0.1 10*3/uL (ref 0.0–0.1)
EOS ABS: 0.7 10*3/uL — AB (ref 0.0–0.5)
EOS%: 3.5 % (ref 0.0–7.0)
HCT: 39.9 % (ref 38.4–49.9)
HEMOGLOBIN: 13.4 g/dL (ref 13.0–17.1)
LYMPH%: 65.2 % — ABNORMAL HIGH (ref 14.0–49.0)
MCH: 31.2 pg (ref 27.2–33.4)
MCHC: 33.6 g/dL (ref 32.0–36.0)
MCV: 92.8 fL (ref 79.3–98.0)
MONO#: 1.6 10*3/uL — AB (ref 0.1–0.9)
MONO%: 8.1 % (ref 0.0–14.0)
NEUT%: 22.7 % — ABNORMAL LOW (ref 39.0–75.0)
NEUTROS ABS: 4.4 10*3/uL (ref 1.5–6.5)
Platelets: 215 10*3/uL (ref 140–400)
RBC: 4.3 10*6/uL (ref 4.20–5.82)
RDW: 14.4 % (ref 11.0–14.6)
WBC: 19.2 10*3/uL — ABNORMAL HIGH (ref 4.0–10.3)
lymph#: 12.5 10*3/uL — ABNORMAL HIGH (ref 0.9–3.3)
nRBC: 0 % (ref 0–0)

## 2014-01-25 LAB — COMPREHENSIVE METABOLIC PANEL (CC13)
ALBUMIN: 3.9 g/dL (ref 3.5–5.0)
ALT: 17 U/L (ref 0–55)
ANION GAP: 10 meq/L (ref 3–11)
AST: 17 U/L (ref 5–34)
Alkaline Phosphatase: 40 U/L (ref 40–150)
BILIRUBIN TOTAL: 0.42 mg/dL (ref 0.20–1.20)
BUN: 21.9 mg/dL (ref 7.0–26.0)
CHLORIDE: 105 meq/L (ref 98–109)
CO2: 25 meq/L (ref 22–29)
Calcium: 9.5 mg/dL (ref 8.4–10.4)
Creatinine: 1.3 mg/dL (ref 0.7–1.3)
Glucose: 156 mg/dl — ABNORMAL HIGH (ref 70–140)
Potassium: 4.3 mEq/L (ref 3.5–5.1)
SODIUM: 140 meq/L (ref 136–145)
TOTAL PROTEIN: 6.4 g/dL (ref 6.4–8.3)

## 2014-01-25 LAB — TECHNOLOGIST REVIEW

## 2014-01-25 NOTE — Telephone Encounter (Signed)
gv and printed appt sched and avs for pt for May and Aug.... °

## 2014-01-26 NOTE — Progress Notes (Signed)
[place], [place] [address] [place] [address]  DIAGNOSIS: CLL (chronic lymphocytic leukemia) - Plan: CBC with Differential, CBC with Differential, Comprehensive metabolic panel (Cmet) - [facility]  Chief Complaint  Patient presents with  . CLL (chronic lymphocytic leukemia)    CURRENT THERAPY: Observation.   INTERVAL HISTORY: Tony Marshall 73 y.o. male with a history of diabetes mellitus wihtout complication (8588), hypertension and now newly diagnosed CLL.  He was last seen by me on 10/25/2013.    He is accopanied by his wife Enid Derry.   He reports doing well overall.  He was treated for a abdominal pain which has now completely resided.  He cannot recall the exam medication.    He reports compliance to his medical therapy. He denies any viral illnesses or recent travels. He denies feeling enlarged lymph nodes anywhere on his body.  He denies any night sweats, weight lost or fevers.   MEDICAL HISTORY: Past Medical History  Diagnosis Date  . Diabetes mellitus without complication   . Hypertension     INTERIM HISTORY: has Type II or unspecified type diabetes mellitus without mention of complication, uncontrolled; Unspecified essential hypertension; Pure hypercholesterolemia; Lymphocytosis; ED (erectile dysfunction); and CLL (chronic lymphocytic leukemia) on his problem list.    ALLERGIES:  is allergic to glycopyrrolate.  MEDICATIONS: has a current medication list which includes the following prescription(s): amlodipine, aspirin, benazepril, carvedilol, dexlansoprazole, doxazosin, glucose blood, insulin aspart, insulin detemir, metformin, pioglitazone, rosuvastatin, vardenafil, and zolpidem.  SURGICAL HISTORY:  Past Surgical History  Procedure Laterality Date  . Hemorrhoid surgery      Sclerotherapy  . Blepharoplasty      REVIEW OF SYSTEMS:   Constitutional: Denies fevers, chills or abnormal weight loss Eyes:  Denies blurriness of vision Ears, nose, mouth, throat, and face: Denies mucositis or sore throat Respiratory: Denies cough, dyspnea or wheezes Cardiovascular: Denies palpitation, chest discomfort or lower extremity swelling Gastrointestinal:  Denies nausea, heartburn or change in bowel habits Skin: Denies abnormal skin rashes Lymphatics: Denies new lymphadenopathy or easy bruising Neurological:Denies numbness, tingling or new weaknesses Behavioral/Psych: Mood is stable, no new changes  All other systems were reviewed with the patient and are negative.  PHYSICAL EXAMINATION: ECOG PERFORMANCE STATUS: 0 - Asymptomatic  Blood pressure 143/64, pulse 60, temperature 97.5 F (36.4 C), temperature source Oral, resp. rate 18, height 5' 7"  (1.702 m), weight 164 lb 12.8 oz (74.753 kg), SpO2 99.00%.  GENERAL:alert, no distress and comfortable; elderly male who appears his stated age.  SKIN: skin color, texture, turgor are normal, no rashes or significant lesions EYES: normal, Conjunctiva are pink and non-injected, sclera clear OROPHARYNX:no exudate, no erythema and lips, buccal mucosa, and tongue normal  NECK: supple, thyroid normal size, non-tender, without nodularity LYMPH:  no palpable lymphadenopathy in the cervical, axillary or supraclavicular LUNGS: clear to auscultation and percussion with normal breathing effort HEART: regular rate & rhythm and no murmurs and no lower extremity edema ABDOMEN:abdomen soft, non-tender and normal bowel sounds Musculoskeletal:no cyanosis of digits and no clubbing; R hand without digit #5.  NEURO: alert & oriented x 3 with fluent speech, no focal motor/sensory deficits   LABORATORY DATA: Results for orders placed in visit on 01/25/14 (from the past 48 hour(s))  CBC WITH DIFFERENTIAL     Status: Abnormal   Collection Time    01/25/14  1:18 PM      Result Value Ref Range   WBC 19.2 (*) 4.0 -  10.3 10e3/uL   NEUT# 4.4  1.5 - 6.5 10e3/uL   HGB 13.4  13.0 -  17.1 g/dL   HCT 39.9  38.4 - 49.9 %   Platelets 215  140 - 400 10e3/uL   MCV 92.8  79.3 - 98.0 fL   MCH 31.2  27.2 - 33.4 pg   MCHC 33.6  32.0 - 36.0 g/dL   RBC 4.30  4.20 - 5.82 10e6/uL   RDW 14.4  11.0 - 14.6 %   lymph# 12.5 (*) 0.9 - 3.3 10e3/uL   MONO# 1.6 (*) 0.1 - 0.9 10e3/uL   Eosinophils Absolute 0.7 (*) 0.0 - 0.5 10e3/uL   Basophils Absolute 0.1  0.0 - 0.1 10e3/uL   NEUT% 22.7 (*) 39.0 - 75.0 %   LYMPH% 65.2 (*) 14.0 - 49.0 %   MONO% 8.1  0.0 - 14.0 %   EOS% 3.5  0.0 - 7.0 %   BASO% 0.5  0.0 - 2.0 %   nRBC 0  0 - 0 %  TECHNOLOGIST REVIEW     Status: None   Collection Time    01/25/14  1:18 PM      Result Value Ref Range   Technologist Review Variant lymphs and Few Smudge cells present    COMPREHENSIVE METABOLIC PANEL (OZ36)     Status: Abnormal   Collection Time    01/25/14  1:18 PM      Result Value Ref Range   Sodium 140  136 - 145 mEq/L   Potassium 4.3  3.5 - 5.1 mEq/L   Chloride 105  98 - 109 mEq/L   CO2 25  22 - 29 mEq/L   Glucose 156 (*) 70 - 140 mg/dl   BUN 21.9  7.0 - 26.0 mg/dL   Creatinine 1.3  0.7 - 1.3 mg/dL   Total Bilirubin 0.42  0.20 - 1.20 mg/dL   Alkaline Phosphatase 40  40 - 150 U/L   AST 17  5 - 34 U/L   ALT 17  0 - 55 U/L   Total Protein 6.4  6.4 - 8.3 g/dL   Albumin 3.9  3.5 - 5.0 g/dL   Calcium 9.5  8.4 - 10.4 mg/dL   Anion Gap 10  3 - 11 mEq/L    Labs:  Lab Results  Component Value Date   WBC 19.2* 01/25/2014   HGB 13.4 01/25/2014   HCT 39.9 01/25/2014   MCV 92.8 01/25/2014   PLT 215 01/25/2014   NEUTROABS 4.4 01/25/2014      Chemistry      Component Value Date/Time   NA 140 01/25/2014 1318   NA 138 01/18/2014 1007   K 4.3 01/25/2014 1318   K 4.0 01/18/2014 1007   CL 103 01/18/2014 1007   CO2 25 01/25/2014 1318   CO2 26 01/18/2014 1007   BUN 21.9 01/25/2014 1318   BUN 18 01/18/2014 1007   CREATININE 1.3 01/25/2014 1318   CREATININE 1.2 01/18/2014 1007      Component Value Date/Time   CALCIUM 9.5 01/25/2014 1318   CALCIUM 9.0 01/18/2014 1007    ALKPHOS 40 01/25/2014 1318   ALKPHOS 32* 01/18/2014 1007   AST 17 01/25/2014 1318   AST 16 01/18/2014 1007   ALT 17 01/25/2014 1318   ALT 18 01/18/2014 1007   BILITOT 0.42 01/25/2014 1318   BILITOT 0.6 01/18/2014 1007     CBC:  Recent Labs Lab 01/25/14 1318  WBC 19.2*  NEUTROABS 4.4  HGB 13.4  HCT  39.9  MCV 92.8  PLT 215   Microbiology Recent Results (from the past 240 hour(s))  TECHNOLOGIST REVIEW     Status: None   Collection Time    01/25/14  1:18 PM      Result Value Ref Range Status   Technologist Review Variant lymphs and Few Smudge cells present   Final   Interpretation Peripheral Blood Flow Cytometry - MONOCLONAL B-CELL POPULATION DETECTED. - SEE COMMENT. Diagnosis Comment: There is a monoclonal B-cell population with expression of B-cell markers and coexpression of CD5 and CD23. Kappa and lambda are too dim for evaluation. The phenotypic findings are consistent with chronic lymphocytic leukemia. Vicente Males MD Pathologist, Electronic Signature (Case signed 09/24/2013) GROSS AND MICROSCOPIC INFORMATION Source Peripheral Blood Flow Cytometry Microscopic Gated population: Flow cytometric immunophenotyping is performed using antibiodies to the antigens listed in the table below. Electronic gates are placed around a cell cluster displaying light scatter properties corresponding to lymphocytes. - Abnormal Cells in gated population: 84 % - Phenotype of Abnormal Cells: CD5, CD19, CD20, CD21, CD22, CD23, HLA-Dr Specimen Table Lymphoid Associated Myeloid Associated Misc. As CD2 neg CD19 pos CD11c ND CD45 ND CD3 neg CD20 pos CD13 ND HLA-DR pos CD4 neg CD21 pos CD14 ND CD10 neg CD5 pos CD22 pos CD15 ND CD56/16 ND CD7 neg CD23 pos CD33 ND ZAP70 ND CD8 neg CD103 ND MPO ND CD34 ND CD25 ND FMC7 ND CD117 ND CD52 ND sKappa tested CD38 ND sLambda tested cKappa ND cLambda ND Gross Received from [facility] is one lavender tube labeled as PBS to evaluate for  lymphocytosis.  RADIOGRAPHIC STUDIES: No results found.  ASSESSMENT: Tony Marshall 73 y.o. male with a history of CLL (chronic lymphocytic leukemia) - Plan: CBC with Differential, CBC with Differential, Comprehensive metabolic panel (Cmet) - [facility]   PLAN:   1. CLL Rai Stage O.   -- Repeat CBC is consistent with his prior CBC with a white blood cell count of 19.2 with variant lymphs present. Last visit, we discussed his differential included CLL. Flow cytometry confirmed CLL.  He is asymptomatic and his remaining blood counts including his hemoglobin and platelets are normal. He was provided a detailed handout on CLL.   2. Follow-up.  Patient will return in three months for repeat laboratory testing and for a follow-up visit in 6 months with CBC and chemistries.   All questions were answered. The patient knows to call the clinic with any problems, questions or concerns. We can certainly see the patient much sooner if necessary.  I spent 10 minutes counseling the patient face to face. The total time spent in the appointment was 15 minutes.    Rodriquez Thorner, MD 01/26/2014 5:37 AM

## 2014-02-09 ENCOUNTER — Encounter: Payer: Self-pay | Admitting: Endocrinology

## 2014-02-09 ENCOUNTER — Other Ambulatory Visit: Payer: Self-pay | Admitting: *Deleted

## 2014-02-09 ENCOUNTER — Ambulatory Visit (INDEPENDENT_AMBULATORY_CARE_PROVIDER_SITE_OTHER): Payer: Medicare Other | Admitting: Endocrinology

## 2014-02-09 VITALS — BP 122/68 | HR 63 | Temp 98.1°F | Resp 16 | Ht 67.5 in | Wt 162.8 lb

## 2014-02-09 DIAGNOSIS — E1165 Type 2 diabetes mellitus with hyperglycemia: Principal | ICD-10-CM

## 2014-02-09 DIAGNOSIS — IMO0001 Reserved for inherently not codable concepts without codable children: Secondary | ICD-10-CM

## 2014-02-09 DIAGNOSIS — I10 Essential (primary) hypertension: Secondary | ICD-10-CM

## 2014-02-09 DIAGNOSIS — E7800 Pure hypercholesterolemia, unspecified: Secondary | ICD-10-CM

## 2014-02-09 MED ORDER — GLUCOSE BLOOD VI STRP: ORAL_STRIP | Status: DC

## 2014-02-09 MED ORDER — INSULIN LISPRO 100 UNIT/ML (KWIKPEN): PEN_INJECTOR | SUBCUTANEOUS | Status: DC

## 2014-02-09 NOTE — Progress Notes (Signed)
Patient ID: GINA COSTILLA, male   DOB: [birth year], 73 y.o.   MRN: [id number]   Reason for Appointment: Diabetes follow-up   History of Present Illness   Diagnosis: Type 2 DIABETES MELITUS, date of diagnosis:  1992        His blood sugar has been  difficult to control over the last few years because of variability in his blood sugars On basal bolus insulin for a few years and his oral hypoglycemic drugs have been continued Also did not tolerate GLP-1 drugs because of nausea He is essentially acting like a a type I patient with requiring relatively low amounts of insulin and twice a day dosage of basal insulin He has done somewhat better with Levemir compared to Lantus and is using this twice a day also  Problems identified from review of his recent readings and history:   Postprandial hyperglycemia especially after evening meal but does not have enough readings on his recent monitoring to help identify a pattern. Also he does not know what factors make the sugars much higher on some days.  He is not finding carbohydrate counting easy to do a useful and is arbitrarily taking insulin again  Taking insulin right after eating which may not cover his high carbohydrate meals adequately  Significant variability in blood sugars overall  Tendency to fasting hypoglycemia which is relatively better now with reducing evening Levemir by one unit  Significant variability in fasting blood sugars possibly from dawn phenomenon  Not exercising recently  Not checking readings in the late afternoon or before supper to help adjust morning Levemir and do a correction dose for evening insulin  Oral hypoglycemic drugs: Actos, Metformin        Side effects from medications: None Insulin regimen: Levemir insulin 8 units in the morning and 17 in the evening, mealtime insulin: Bfst 5-7 units  supper 6-10         Proper timing of medications in relation to meals: Yes.          Monitors blood glucose: QD     Glucometer: One Touch.          Blood Glucose readings from meter download:   PREMEAL Breakfast Lunch Dinner Bedtime Overall  Glucose range:  43-250   115  ?   100-230    Mean/median:      156    POST-MEAL PC Breakfast PC Lunch PC Dinner  Glucose range:  176, 165   200   170-348   Mean/median:        Hypoglycemia:  2 episodes on waking up, last one on 01/30/14. One episode at 2 AM of 50 on 01/16/14         Meals: 3 meals per day.  he is eating less at lunch; sometimes has sandwiches. Has dinner at 7-8 PM    Physical activity: exercise: Rare exercise bike            LABS:  Lab Results  Component Value Date   HGBA1C 8.0* 01/18/2014   HGBA1C 8.2* 08/31/2013   HGBA1C 7.9* 07/16/2013   Lab Results  Component Value Date   MICROALBUR 0.6 01/18/2014   LDLCALC 80 01/18/2014   CREATININE 1.3 01/25/2014    No visits with results within 1 Week(s) from this visit. Latest known visit with results is:  Appointment on 01/25/2014  Component Date Value Ref Range Status  . WBC 01/25/2014 19.2* 4.0 - 10.3 10e3/uL Final  . NEUT# 01/25/2014 4.4  1.5 - 6.5  10e3/uL Final  . HGB 01/25/2014 13.4  13.0 - 17.1 g/dL Final  . HCT 01/25/2014 39.9  38.4 - 49.9 % Final  . Platelets 01/25/2014 215  140 - 400 10e3/uL Final  . MCV 01/25/2014 92.8  79.3 - 98.0 fL Final  . MCH 01/25/2014 31.2  27.2 - 33.4 pg Final  . MCHC 01/25/2014 33.6  32.0 - 36.0 g/dL Final  . RBC 01/25/2014 4.30  4.20 - 5.82 10e6/uL Final  . RDW 01/25/2014 14.4  11.0 - 14.6 % Final  . lymph# 01/25/2014 12.5* 0.9 - 3.3 10e3/uL Final  . MONO# 01/25/2014 1.6* 0.1 - 0.9 10e3/uL Final  . Eosinophils Absolute 01/25/2014 0.7* 0.0 - 0.5 10e3/uL Final  . Basophils Absolute 01/25/2014 0.1  0.0 - 0.1 10e3/uL Final  . NEUT% 01/25/2014 22.7* 39.0 - 75.0 % Final  . LYMPH% 01/25/2014 65.2* 14.0 - 49.0 % Final  . MONO% 01/25/2014 8.1  0.0 - 14.0 % Final  . EOS% 01/25/2014 3.5  0.0 - 7.0 % Final  . BASO% 01/25/2014 0.5  0.0 - 2.0 % Final  . nRBC 01/25/2014  0  0 - 0 % Final  . Sodium 01/25/2014 140  136 - 145 mEq/L Final  . Potassium 01/25/2014 4.3  3.5 - 5.1 mEq/L Final  . Chloride 01/25/2014 105  98 - 109 mEq/L Final  . CO2 01/25/2014 25  22 - 29 mEq/L Final  . Glucose 01/25/2014 156* 70 - 140 mg/dl Final  . BUN 01/25/2014 21.9  7.0 - 26.0 mg/dL Final  . Creatinine 01/25/2014 1.3  0.7 - 1.3 mg/dL Final  . Total Bilirubin 01/25/2014 0.42  0.20 - 1.20 mg/dL Final  . Alkaline Phosphatase 01/25/2014 40  40 - 150 U/L Final  . AST 01/25/2014 17  5 - 34 U/L Final  . ALT 01/25/2014 17  0 - 55 U/L Final  . Total Protein 01/25/2014 6.4  6.4 - 8.3 g/dL Final  . Albumin 01/25/2014 3.9  3.5 - 5.0 g/dL Final  . Calcium 01/25/2014 9.5  8.4 - 10.4 mg/dL Final  . Anion Gap 01/25/2014 10  3 - 11 mEq/L Final  . Technologist Review 01/25/2014 Variant lymphs and Few Smudge cells present   Final      Medication List       This list is accurate as of: 02/09/14  1:27 PM.  Always use your most recent med list.               amLODipine 10 MG tablet  Commonly known as:  NORVASC  Take 1 tablet (10 mg total) by mouth daily.     aspirin 81 MG EC tablet  Take 81 mg by mouth daily. Swallow whole.     benazepril 20 MG tablet  Commonly known as:  LOTENSIN  Take 1 tablet (20 mg total) by mouth daily.     carvedilol 25 MG tablet  Commonly known as:  COREG  Take 25 mg by mouth 2 (two) times daily with a meal.     Dexlansoprazole 30 MG capsule  Commonly known as:  DEXILANT  Take 1 capsule (30 mg total) by mouth daily.     doxazosin 4 MG tablet  Commonly known as:  CARDURA  Take 1 tablet (4 mg total) by mouth at bedtime.     glucose blood test strip  Commonly known as:  ONE TOUCH ULTRA TEST  Use as instructed to check blood sugars once a day dx code 250.02     insulin  aspart 100 UNIT/ML FlexPen  Commonly known as:  NOVOLOG FLEXPEN  Inject 3-9 units at breakfast, 3-4 units at lunch and 6-9 units at dinner     Insulin Detemir 100 UNIT/ML Pen   Commonly known as:  LEVEMIR  Inject 8-17 Units into the skin 2 (two) times daily. 8 units in am and 17 in pm     metFORMIN 1000 MG tablet  Commonly known as:  GLUCOPHAGE  Take 1,000 mg by mouth 2 (two) times daily with a meal.     pioglitazone 15 MG tablet  Commonly known as:  ACTOS  Take 1 tablet (15 mg total) by mouth daily.     rosuvastatin 5 MG tablet  Commonly known as:  CRESTOR  Take 5 mg by mouth every morning.     vardenafil 20 MG tablet  Commonly known as:  LEVITRA  Take 1 tablet (20 mg total) by mouth daily as needed for erectile dysfunction.     zolpidem 5 MG tablet  Commonly known as:  AMBIEN  Take 1 1/2 tablets every night at bedtime for sleep        Allergies:  Allergies  Allergen Reactions  . Glycopyrrolate Rash    Past Medical History  Diagnosis Date  . Diabetes mellitus without complication   . Hypertension     Past Surgical History  Procedure Laterality Date  . Hemorrhoid surgery      Sclerotherapy  . Blepharoplasty      Family History  Problem Relation Age of Onset  . Diabetes Father     Social History:  reports that he has never smoked. He does not have any smokeless tobacco history on file. He reports that he drinks alcohol. He reports that he does not use illicit drugs.  Review of Systems:  He previously has had problems with reflux and dysphagia  HYPERTENSION:  home BP: 120-130/60-70  HYPERLIPIDEMIA: The lipid abnormality consists of elevated LDL which is very well controlled  Lab Results  Component Value Date   CHOL 148 01/18/2014   HDL 50.40 01/18/2014   LDLCALC 80 01/18/2014   TRIG 88.0 01/18/2014   CHOLHDL 3 01/18/2014    No abdominal pain, used Align  Last colonoscopy was in April 2008    He has been told to have mild CLL by the hematologist and no treatment needed, will be getting regular followup  Chronic insomnia    Examination:   BP 122/68  Pulse 63  Temp(Src) 98.1 F (36.7 C)  Resp 16  Ht 5' 7.5" (1.715 m)   Wt 162 lb 12.8 oz (73.846 kg)  BMI 25.11 kg/m2  SpO2 96%  Body mass index is 25.11 kg/(m^2).   No ankle edema  ASSESSMENT/ PLAN:   Diabetes type 2, inadequately controlled with persistently high A1c  Problems identified are outlined in the history of present illness He does have significant variability in his blood sugars at all times A1c has not improved even with trying to have him cover his meals based on carbohydrate counting Also control is limited by tendency to hypoglycemia at times; this is less recently with his reducing evening Levemir by one unit Mealtime coverage: This appears to be somewhat arbitrary and he did not find carbohydrate counting useful Discussed the following today  More frequent glucose monitoring before and after meals especially supper  May need to adjust morning Levemir based on blood sugars before supper  Try to take NovoLog a few minutes before eating is eating higher fat meals  To check blood sugars after supper more often to help adjust dosage  Regular exercise program No change in doses needed today  Hyperlipidemia: Adequately controlled, he will also checked periodically at home  HYPERTENSION: Well controlled and will continue same regimen  He will need his complete physical exam on the next visit  Counseling time over 50% of today's 25 minute visit  Onna Nodal 02/09/2014, 1:27 PM

## 2014-02-09 NOTE — Patient Instructions (Signed)
Some sugars before supper  Take novolog 5 min before meal expept when eating hi fat meals

## 2014-03-16 ENCOUNTER — Ambulatory Visit: Payer: Medicare Other | Admitting: Endocrinology

## 2014-03-21 ENCOUNTER — Encounter: Payer: Self-pay | Admitting: Internal Medicine

## 2014-03-29 ENCOUNTER — Telehealth: Payer: Self-pay | Admitting: Endocrinology

## 2014-03-29 ENCOUNTER — Other Ambulatory Visit: Payer: Self-pay | Admitting: *Deleted

## 2014-03-29 MED ORDER — CARVEDILOL 25 MG PO TABS: 25.0000 mg | ORAL_TABLET | Freq: Two times a day (BID) | ORAL | Status: DC

## 2014-03-29 NOTE — Telephone Encounter (Signed)
Pt needs the script for carvedilol 25 mg two times daily 90 day supply

## 2014-03-29 NOTE — Telephone Encounter (Signed)
rx sent

## 2014-04-08 ENCOUNTER — Other Ambulatory Visit: Payer: Self-pay | Admitting: *Deleted

## 2014-04-08 ENCOUNTER — Telehealth: Payer: Self-pay | Admitting: Endocrinology

## 2014-04-08 MED ORDER — AMLODIPINE BESYLATE 10 MG PO TABS: 10.0000 mg | ORAL_TABLET | Freq: Every day | ORAL | Status: DC

## 2014-04-08 NOTE — Telephone Encounter (Signed)
pts wife called and stated she had been waiting on auth for Norvasc   Call back [phone]  [facility] :)

## 2014-04-08 NOTE — Telephone Encounter (Signed)
rx sent

## 2014-04-29 ENCOUNTER — Other Ambulatory Visit (HOSPITAL_BASED_OUTPATIENT_CLINIC_OR_DEPARTMENT_OTHER): Payer: Medicare Other

## 2014-04-29 DIAGNOSIS — C9110 Chronic lymphocytic leukemia of B-cell type not having achieved remission: Secondary | ICD-10-CM

## 2014-04-29 LAB — CBC WITH DIFFERENTIAL/PLATELET
BASO%: 0.5 % (ref 0.0–2.0)
Basophils Absolute: 0.1 10*3/uL (ref 0.0–0.1)
EOS%: 1.7 % (ref 0.0–7.0)
Eosinophils Absolute: 0.3 10*3/uL (ref 0.0–0.5)
HEMATOCRIT: 42.3 % (ref 38.4–49.9)
HGB: 13.7 g/dL (ref 13.0–17.1)
LYMPH%: 76 % — AB (ref 14.0–49.0)
MCH: 30.8 pg (ref 27.2–33.4)
MCHC: 32.3 g/dL (ref 32.0–36.0)
MCV: 95.1 fL (ref 79.3–98.0)
MONO#: 0.7 10*3/uL (ref 0.1–0.9)
MONO%: 3.9 % (ref 0.0–14.0)
NEUT#: 3.4 10*3/uL (ref 1.5–6.5)
NEUT%: 17.9 % — AB (ref 39.0–75.0)
PLATELETS: 232 10*3/uL (ref 140–400)
RBC: 4.45 10*6/uL (ref 4.20–5.82)
RDW: 14.1 % (ref 11.0–14.6)
WBC: 18.9 10*3/uL — ABNORMAL HIGH (ref 4.0–10.3)
lymph#: 14.4 10*3/uL — ABNORMAL HIGH (ref 0.9–3.3)

## 2014-04-29 LAB — TECHNOLOGIST REVIEW

## 2014-05-06 ENCOUNTER — Other Ambulatory Visit: Payer: Self-pay | Admitting: *Deleted

## 2014-05-06 MED ORDER — AMLODIPINE BESYLATE 10 MG PO TABS: 10.0000 mg | ORAL_TABLET | Freq: Every day | ORAL | Status: DC

## 2014-05-10 ENCOUNTER — Other Ambulatory Visit (INDEPENDENT_AMBULATORY_CARE_PROVIDER_SITE_OTHER): Payer: Medicare Other

## 2014-05-10 DIAGNOSIS — E1165 Type 2 diabetes mellitus with hyperglycemia: Principal | ICD-10-CM

## 2014-05-10 DIAGNOSIS — C9110 Chronic lymphocytic leukemia of B-cell type not having achieved remission: Secondary | ICD-10-CM

## 2014-05-10 DIAGNOSIS — E7800 Pure hypercholesterolemia, unspecified: Secondary | ICD-10-CM

## 2014-05-10 DIAGNOSIS — IMO0001 Reserved for inherently not codable concepts without codable children: Secondary | ICD-10-CM

## 2014-05-10 LAB — HEMOGLOBIN A1C: HEMOGLOBIN A1C: 7.7 % — AB (ref 4.6–6.5)

## 2014-05-10 LAB — COMPREHENSIVE METABOLIC PANEL
ALT: 14 U/L (ref 0–53)
AST: 14 U/L (ref 0–37)
Albumin: 3.7 g/dL (ref 3.5–5.2)
Alkaline Phosphatase: 29 U/L — ABNORMAL LOW (ref 39–117)
BILIRUBIN TOTAL: 0.7 mg/dL (ref 0.2–1.2)
BUN: 20 mg/dL (ref 6–23)
CALCIUM: 9.4 mg/dL (ref 8.4–10.5)
CHLORIDE: 103 meq/L (ref 96–112)
CO2: 27 meq/L (ref 19–32)
CREATININE: 1.1 mg/dL (ref 0.4–1.5)
GFR: 74.12 mL/min (ref >60.00)
Glucose, Bld: 128 mg/dL — ABNORMAL HIGH (ref 70–99)
Potassium: 4.4 mEq/L (ref 3.5–5.1)
Sodium: 138 mEq/L (ref 135–145)
Total Protein: 6.3 g/dL (ref 6.0–8.3)

## 2014-05-10 LAB — LIPID PANEL
Cholesterol: 140 mg/dL (ref 0–200)
HDL: 52.4 mg/dL (ref >39.00)
LDL Cholesterol: 75 mg/dL (ref 0–99)
TRIGLYCERIDES: 65 mg/dL (ref 0.0–149.0)
Total CHOL/HDL Ratio: 3
VLDL: 13 mg/dL (ref 0.0–40.0)

## 2014-05-10 LAB — MICROALBUMIN / CREATININE URINE RATIO
Creatinine,U: 72 mg/dL
MICROALB UR: 1.3 mg/dL (ref 0.0–1.9)
Microalb Creat Ratio: 1.8 mg/g (ref 0.0–30.0)

## 2014-05-10 LAB — URINALYSIS, ROUTINE W REFLEX MICROSCOPIC
Bilirubin Urine: NEGATIVE
HGB URINE DIPSTICK: NEGATIVE
Ketones, ur: NEGATIVE
Leukocytes, UA: NEGATIVE
Nitrite: NEGATIVE
PH: 6 (ref 5.0–8.0)
RBC / HPF: NONE SEEN (ref 0–?)
Specific Gravity, Urine: 1.01 (ref 1.000–1.030)
TOTAL PROTEIN, URINE-UPE24: NEGATIVE
URINE GLUCOSE: 100 — AB
Urobilinogen, UA: 0.2 (ref 0.0–1.0)
WBC UA: NONE SEEN (ref 0–?)

## 2014-05-12 ENCOUNTER — Encounter: Payer: Self-pay | Admitting: Endocrinology

## 2014-05-12 ENCOUNTER — Ambulatory Visit (INDEPENDENT_AMBULATORY_CARE_PROVIDER_SITE_OTHER): Payer: Medicare Other | Admitting: Endocrinology

## 2014-05-12 ENCOUNTER — Encounter: Payer: Self-pay | Admitting: Internal Medicine

## 2014-05-12 VITALS — BP 132/74 | HR 76 | Temp 98.0°F | Resp 14 | Ht 67.0 in | Wt 162.0 lb

## 2014-05-12 DIAGNOSIS — R141 Gas pain: Secondary | ICD-10-CM

## 2014-05-12 DIAGNOSIS — R143 Flatulence: Secondary | ICD-10-CM

## 2014-05-12 DIAGNOSIS — R142 Eructation: Secondary | ICD-10-CM

## 2014-05-12 DIAGNOSIS — Z23 Encounter for immunization: Secondary | ICD-10-CM

## 2014-05-12 DIAGNOSIS — E1165 Type 2 diabetes mellitus with hyperglycemia: Principal | ICD-10-CM

## 2014-05-12 DIAGNOSIS — E7800 Pure hypercholesterolemia, unspecified: Secondary | ICD-10-CM

## 2014-05-12 DIAGNOSIS — R140 Abdominal distension (gaseous): Secondary | ICD-10-CM

## 2014-05-12 DIAGNOSIS — I10 Essential (primary) hypertension: Secondary | ICD-10-CM

## 2014-05-12 DIAGNOSIS — IMO0001 Reserved for inherently not codable concepts without codable children: Secondary | ICD-10-CM

## 2014-05-12 DIAGNOSIS — Z0000 Encounter for general adult medical examination without abnormal findings: Secondary | ICD-10-CM

## 2014-05-12 NOTE — Progress Notes (Signed)
Patient ID: Tony Marshall, male   DOB: 1941/02/19, 73 y.o.   MRN: [id number]   Reason for Appointment: Annual physical exam  History of Present Illness   1. Type 2 DIABETES MELITUS, date of diagnosis:  1992        His blood sugar has been  difficult to control over the last few years because of variability in his blood sugars On basal bolus insulin for a few years and his oral hypoglycemic drugs have been continued Also did not tolerate GLP-1 drugs or symlin because of nausea He is essentially looking like a a type I patient with requiring relatively low amounts of insulin and twice a day dosage of basal insulin He has done somewhat better with Levemir compared to Lantus and is using this twice a day also  Recent history: He has been his insulin significantly since his last visit especially Levemir. Previously was on 94 Levemir at bedtime He thinks this is because of his wife cooking very differently  and he is eating much healthier diet  Problems identified and blood sugar patterns from review of his recent readings and history:   Fasting blood sugars are still quite variable is recently not usually over 160  He is not checking blood sugars much after breakfast or lunch and today without any insulin coverage for breakfast and a cookie his blood sugars over 200 in the office  Use checking blood sugars late at night only in the evenings and these are usually not higher now.  Did have one episode of hypoglycemia namely midnight probably from overestimating suppertime coverage   He is not able to do carbohydrate counting easy despite instructions is doing arbitrary doses of insulin  Taking insulin right after eating which may not cover his high carbohydrate meals adequately  Not checking readings in the late afternoon or before supper to help adjust morning Levemir and do a correction dose for evening insulin  Oral hypoglycemic drugs: Actos, Metformin        Side effects from  medications: None Insulin regimen: Levemir insulin 8 units in the morning and 12 in the evening, mealtime insulin: Mealtime insulin: Breakfast = 0-3 units,  supper 6-10 units based on meal size         Proper timing of medications in relation to meals: Yes.          Monitors blood glucose: QD    Glucometer: One Touch.          Blood Glucose readings from one touch zoom software download:   PREMEAL Breakfast Lunch Dinner Bedtime Overall  Glucose range:  90-183   94-159   77   37-160    Mean/median:      148     Hypoglycemia: 1 episodes hs           Meals: 3 meals per day.  he is eating less fried and starchy foods overall and a light meal at lunch; sometimes has sandwiches. Has dinner at 7-8 PM    Physical activity: exercise: Golf, gardening            LABS:  Lab Results  Component Value Date   HGBA1C 7.7* 05/10/2014   HGBA1C 8.0* 01/18/2014   HGBA1C 8.2* 08/31/2013   Lab Results  Component Value Date   MICROALBUR 1.3 05/10/2014   LDLCALC 75 05/10/2014   CREATININE 1.1 05/10/2014    2. Hypertension:    His blood pressure has been high for about 30 years.  He was  evaluated in 1/08 with urine metanephrines and renal artery ultrasound.  Recent home blood pressure about 120-130/70, checking periodically. BP has been well controlled consistently over the last few years   3. PREVENTIVE CARE:  Annual hemoccults:           due now    Zostavax:                             2012   Colonoscopy/sigmoidoscopy:  2012   Flu vaccine yes  PSA:  not indicated   Pneumovax: Prevnar: Is due now  2000  Eye exams: 8/10  Aspirin: Yes      Appointment on 05/10/2014  Component Date Value Ref Range Status  . Hemoglobin A1C 05/10/2014 7.7* 4.6 - 6.5 % Final   Glycemic Control Guidelines for People with Diabetes:Non Diabetic:  <6%Goal of Therapy: <7%Additional Action Suggested:  >8%   . Sodium 05/10/2014 138  135 - 145 mEq/L Final  . Potassium 05/10/2014 4.4  3.5 - 5.1 mEq/L Final  . Chloride  05/10/2014 103  96 - 112 mEq/L Final  . CO2 05/10/2014 27  19 - 32 mEq/L Final  . Glucose, Bld 05/10/2014 128* 70 - 99 mg/dL Final  . BUN 05/10/2014 20  6 - 23 mg/dL Final  . Creatinine, Ser 05/10/2014 1.1  0.4 - 1.5 mg/dL Final  . Total Bilirubin 05/10/2014 0.7  0.2 - 1.2 mg/dL Final  . Alkaline Phosphatase 05/10/2014 29* 39 - 117 U/L Final  . AST 05/10/2014 14  0 - 37 U/L Final  . ALT 05/10/2014 14  0 - 53 U/L Final  . Total Protein 05/10/2014 6.3  6.0 - 8.3 g/dL Final  . Albumin 05/10/2014 3.7  3.5 - 5.2 g/dL Final  . Calcium 05/10/2014 9.4  8.4 - 10.5 mg/dL Final  . GFR 05/10/2014 74.12  >60.00 mL/min Final  . Cholesterol 05/10/2014 140  0 - 200 mg/dL Final   ATP III Classification       Desirable:  < 200 mg/dL               Borderline High:  200 - 239 mg/dL          High:  > = 240 mg/dL  . Triglycerides 05/10/2014 65.0  0.0 - 149.0 mg/dL Final   Normal:  <150 mg/dLBorderline High:  150 - 199 mg/dL  . HDL 05/10/2014 52.40  >39.00 mg/dL Final  . VLDL 05/10/2014 13.0  0.0 - 40.0 mg/dL Final  . LDL Cholesterol 05/10/2014 75  0 - 99 mg/dL Final  . Total CHOL/HDL Ratio 05/10/2014 3   Final                  Men          Women1/2 Average Risk     3.4          3.3Average Risk          5.0          4.42X Average Risk          9.6          7.13X Average Risk          15.0          11.0                      . Microalb, Ur 05/10/2014 1.3  0.0 - 1.9 mg/dL Final  . Creatinine,U  05/10/2014 72.0   Final  . Microalb Creat Ratio 05/10/2014 1.8  0.0 - 30.0 mg/g Final  . Color, Urine 05/10/2014 YELLOW  Yellow;Lt. Yellow Final  . APPearance 05/10/2014 CLEAR  Clear Final  . Specific Gravity, Urine 05/10/2014 1.010  1.000-1.030 Final  . pH 05/10/2014 6.0  5.0 - 8.0 Final  . Total Protein, Urine 05/10/2014 NEGATIVE  Negative Final  . Urine Glucose 05/10/2014 100* Negative Final  . Ketones, ur 05/10/2014 NEGATIVE  Negative Final  . Bilirubin Urine 05/10/2014 NEGATIVE  Negative Final  . Hgb urine dipstick  05/10/2014 NEGATIVE  Negative Final  . Urobilinogen, UA 05/10/2014 0.2  0.0 - 1.0 Final  . Leukocytes, UA 05/10/2014 NEGATIVE  Negative Final  . Nitrite 05/10/2014 NEGATIVE  Negative Final  . WBC, UA 05/10/2014 none seen  0-2/hpf Final  . RBC / HPF 05/10/2014 none seen  0-2/hpf Final  . Squamous Epithelial / LPF 05/10/2014 Rare(0-4/hpf)  Rare(0-4/hpf) Final      Medication List       This list is accurate as of: 05/12/14 10:04 AM.  Always use your most recent med list.               amLODipine 10 MG tablet  Commonly known as:  NORVASC  Take 1 tablet (10 mg total) by mouth daily.     aspirin 81 MG EC tablet  Take 81 mg by mouth daily. Swallow whole.     benazepril 20 MG tablet  Commonly known as:  LOTENSIN  Take 1 tablet (20 mg total) by mouth daily.     carvedilol 25 MG tablet  Commonly known as:  COREG  Take 1 tablet (25 mg total) by mouth 2 (two) times daily with a meal.     Dexlansoprazole 30 MG capsule  Commonly known as:  DEXILANT  Take 1 capsule (30 mg total) by mouth daily.     doxazosin 4 MG tablet  Commonly known as:  CARDURA  Take 1 tablet (4 mg total) by mouth at bedtime.     glucose blood test strip  Commonly known as:  ONE TOUCH ULTRA TEST  Use as instructed to check blood sugars two times  a day dx code 250.02     insulin aspart 100 UNIT/ML FlexPen  Commonly known as:  NOVOLOG FLEXPEN  Inject 3-9 units at breakfast, 3-4 units at lunch and 6-9 units at dinner     Insulin Detemir 100 UNIT/ML Pen  Commonly known as:  LEVEMIR  Inject 8-17 Units into the skin 2 (two) times daily. 8 units in am and 17 in pm     insulin lispro 100 UNIT/ML KiwkPen  Commonly known as:  HUMALOG KWIKPEN  Inject 3-9 units at breakfast 3-4 units at lunch 6-9 units at dinner     metFORMIN 1000 MG tablet  Commonly known as:  GLUCOPHAGE  Take 1,000 mg by mouth 2 (two) times daily with a meal.     pioglitazone 15 MG tablet  Commonly known as:  ACTOS  Take 1 tablet (15 mg  total) by mouth daily.     rosuvastatin 5 MG tablet  Commonly known as:  CRESTOR  Take 5 mg by mouth every morning.     vardenafil 20 MG tablet  Commonly known as:  LEVITRA  Take 1 tablet (20 mg total) by mouth daily as needed for erectile dysfunction.     zolpidem 5 MG tablet  Commonly known as:  AMBIEN  Take 1 1/2 tablets every night  at bedtime for sleep        Allergies:  Allergies  Allergen Reactions  . Glycopyrrolate Rash    Past Medical History  Diagnosis Date  . Diabetes mellitus without complication   . Hypertension     Past Surgical History  Procedure Laterality Date  . Hemorrhoid surgery      Sclerotherapy  . Blepharoplasty      Family History  Problem Relation Age of Onset  . Diabetes Father     Social History:  reports that he has never smoked. He does not have any smokeless tobacco history on file. He reports that he drinks alcohol. He reports that he does not use illicit drugs.  Review of Systems:  Review of Systems  Constitutional: Negative for weight loss and malaise/fatigue.  HENT: Negative for hearing loss.   Eyes: Negative for blurred vision.  Respiratory: Negative for cough and shortness of breath.   Cardiovascular: Negative for chest pain, palpitations, claudication and leg swelling.  Genitourinary: Negative for urgency and frequency.       Has erectile dysfunction, long-standing and has fair results on Rx No nocturia, stream normal, no frequency  Musculoskeletal: Positive for joint pain. Negative for myalgias.       Pain in left shoulder present  Neurological: Positive for tingling. Negative for dizziness, sensory change and headaches.  Psychiatric/Behavioral: Negative for depression.    He previously has had problems with reflux and dysphagia  HYPERLIPIDEMIA: The lipid abnormality consists of elevated LDL which is very well controlled with low-dose Crestor  Lab Results  Component Value Date   CHOL 140 05/10/2014   HDL 52.40  05/10/2014   LDLCALC 75 05/10/2014   TRIG 65.0 05/10/2014   CHOLHDL 3 05/10/2014     No abdominal pain currently, on Align; feels gassy, has upper abdominal pressure but mostly on the physical activity, no nausea Bowel habits: He gets stools in the mornings between breakfast and lunch; this is chronic and has not reported as previously  Last colonoscopy was in April 2012    He has mild CLL which is followed is by the hematologist and no treatment currently needed recent WBC count shows slight increase in lymphocytes  Chronic insomnia present  Diagnosed with sleep apnea (11/09) treated with CPAP. Sleeps better and less daytime fatigue.    Examination:   BP 132/74  Pulse 76  Temp(Src) 98 F (36.7 C)  Resp 14  Ht 5\' 7"  (1.702 m)  Wt 162 lb (73.483 kg)  BMI 25.37 kg/m2  SpO2 97%  Body mass index is 25.37 kg/(m^2).   GENERAL:  Average build.  No pallor, clubbing or edema.  Skin:  no rash or abnormal pigmentation  EYES:  Externally normal.  Fundii:  normal discs and vessels.  ENT: . Oral cavity & pharynx normal.    NECK: No lymphadenopathy. Carotids: Normal character; no bruit. THYROID:  Not palpable.  HEART:  Normal apex, S1 and S2; no murmur or click.  CHEST:  Lungs:  Vescicular breath sounds heard equally.  No crepitations/ wheeze.  ABDOMEN:    Liver/spleen not palpable.  No other mass or tenderness.     RECTAL exam: No mass  Prostate:  No nodules felt; 1+ smooth enlargement.    NEUROLOGICAL:  Reflexes: absent at ankles; 1+ at biceps Monofilament sensation normal at toes.  Vibration nearly normal at toes.  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  No swelling or deformity of other joints.   ASSESSMENT/ PLAN:   Diabetes  type 2, inadequately controlled with persistently high A1c, however her A1c is improved at 7.7  Problems identified and glucose patterns are outlined in the history of present illness He does have relatively less variability in his blood  sugars Also his control is improved with more consistent low-fat diet He is still not monitoring of blood sugars as discussed above  Also control is limited by tendency to hypoglycemia at times; this is less recently with his reducing evening Levemir by one unit Recommended the following today  More frequent glucose monitoring before and after meals  He will need  to adjust morning Levemir based on blood sugars before supper and evening dose based on blood sugar in the morning rather than amount of food intake  Try to take NovoLog a few minutes before eating   Take more consistent doses of NovoLog before breakfast and lunch if not very active  Followup with another A1c in 3 months  No change in Levemir for now  Diabetic complications: No significant neuropathy, does have erectile dysfunction, no evidence of nephropathy or history of retinopathy  Hyperlipidemia: Adequately controlled LDL, HDL and triglycerides  HYPERTENSION: Well controlled and will continue same regimen, he will also checked periodically at home  Sleep apnea, treated with CPAP  History of gastroesophageal reflux  Recent dyspeptic symptoms, some of these may be related to his hiatal hernia; to see gastroenterologist Will defer doing Hemoccult to the gastroenterologist  Diarrhea: This may be related to metformin and he will switch to metformin ER  Preventive care: He is up-to-date on immunizations, colonoscopy as per gastroenterologist, probably needs it in 2017 Needs Prevnar today To continue annual eye exam Continue 81 mg aspirin Continue regimen of exercise with walking and outside activities At least annual lipid levels in followup  Total visit time Re: clinical problems and management = 30 minutes and 30 minutes spent in preventive care  Elayne Snare 05/12/2014, 10:04 AM

## 2014-05-12 NOTE — Patient Instructions (Addendum)
More sugars 2 hrs  after meals; Levemir at night based on am sugar trend, desired range 90-140  Change Metformin to metformin ER 750mg , 2 after dinner

## 2014-05-13 ENCOUNTER — Telehealth: Payer: Self-pay | Admitting: Endocrinology

## 2014-05-13 ENCOUNTER — Ambulatory Visit (INDEPENDENT_AMBULATORY_CARE_PROVIDER_SITE_OTHER): Payer: Medicare Other | Admitting: Internal Medicine

## 2014-05-13 ENCOUNTER — Other Ambulatory Visit: Payer: Self-pay | Admitting: *Deleted

## 2014-05-13 ENCOUNTER — Encounter: Payer: Self-pay | Admitting: Internal Medicine

## 2014-05-13 ENCOUNTER — Other Ambulatory Visit (INDEPENDENT_AMBULATORY_CARE_PROVIDER_SITE_OTHER): Payer: Medicare Other

## 2014-05-13 VITALS — BP 128/72 | HR 70 | Ht 67.0 in | Wt 161.0 lb

## 2014-05-13 DIAGNOSIS — Z86010 Personal history of colonic polyps: Secondary | ICD-10-CM | POA: Insufficient documentation

## 2014-05-13 DIAGNOSIS — R1013 Epigastric pain: Secondary | ICD-10-CM

## 2014-05-13 DIAGNOSIS — K449 Diaphragmatic hernia without obstruction or gangrene: Secondary | ICD-10-CM | POA: Insufficient documentation

## 2014-05-13 DIAGNOSIS — K219 Gastro-esophageal reflux disease without esophagitis: Secondary | ICD-10-CM

## 2014-05-13 DIAGNOSIS — R195 Other fecal abnormalities: Secondary | ICD-10-CM

## 2014-05-13 LAB — IGA: IGA: 170 mg/dL (ref 68–378)

## 2014-05-13 LAB — TSH: TSH: 0.8 u[IU]/mL (ref 0.35–4.50)

## 2014-05-13 MED ORDER — METFORMIN HCL ER 750 MG PO TB24: 750.0000 mg | ORAL_TABLET | Freq: Every day | ORAL | Status: DC

## 2014-05-13 MED ORDER — SACCHAROMYCES BOULARDII 250 MG PO CAPS: 250.0000 mg | ORAL_CAPSULE | Freq: Two times a day (BID) | ORAL | Status: DC

## 2014-05-13 MED ORDER — HYOSCYAMINE SULFATE 0.125 MG SL SUBL: 0.1250 mg | SUBLINGUAL_TABLET | SUBLINGUAL | Status: DC | PRN

## 2014-05-13 NOTE — Telephone Encounter (Signed)
Patients wife called stating that his rx metformin er 750 mg 2 after dinner  Pharmacy: [facility]   Call [phone]   Thank You :)

## 2014-05-13 NOTE — Progress Notes (Addendum)
Patient ID: Tony Marshall, male   DOB: 06-18-1941, 73 y.o.   MRN: [id number] HPI: Tony Marshall is a 73 yo male with PMH of GERD, colon polyps, diabetes, hypertension, sleep apnea, and CLL who is seen in consultation at the request of Dr. Dwyane Dee to evaluate loose stools. He is here today with his wife. He reports nearly a year of loose stools. This occurs in the morning 2-3 times. It doesn't happen throughout the day nor does it wake him from sleep. He reports his stools are nonbloody and without melena. Occasionally he feels a "irritation" in his lower abdomen which is associated with these loose stools. No other abdominal pain. He reports this seemed to start after he developed gastroenteritis when taking a trip in August 2014. This was associated with nausea and vomiting at the time along with diarrhea but all symptoms resolved except he reports he is now left with loose stools each morning. He was seen by Dr. Penelope Coop in October 2014 and given a prescription for glycopyrrolate but he was allergic to this medication. He's also been taking probiotic, Align, daily for 6 months. He hasn't seen much change with the addition of probiotic. He was seen by Dr. Dwyane Dee yesterday and his metformin was changed but he has not yet implemented this change. He will now be taking extended release after dinner. He reports his weight has been stable. He denies heartburn or indigestion. He does use Nexium 40 mg daily. He reports a good appetite with no early satiety, nausea, vomiting, dysphagia or odynophagia. Separate from his lower abdominal pain he feels an epigastric pressure associated with "loss of my breath" which occurs with exertion. He reports he notices this when he walks up an incline or uses a stationary bike. He reports it happens and then seems to resolve even if he continues to exercise. Occasionally he feels like it can be noticed when he changes position. He does not relate to eating or bowel movement. He denies  resting abdominal pressure. He also denies chest pain. No family history of GI malignancy but he does report a family history of ulcers.  He reports 2 previous colonoscopies performed by Dr. Earlean Shawl. The first was in April 2008 he reports one polyp was removed. He feels the test was repeated in 2012 and was normal. He also reports an endoscopy which revealed a small hiatal hernia but was otherwise normal in 2008 at the time of his first colonoscopy.  Past Medical History  Diagnosis Date  . Diabetes mellitus without complication   . Hypertension   . Sleep apnea   . Lymphocytosis   . CLL (chronic lymphocytic leukemia)     Past Surgical History  Procedure Laterality Date  . Hemorrhoid surgery      Sclerotherapy  . Blepharoplasty      Current Outpatient Prescriptions  Medication Sig Dispense Refill  . amLODipine (NORVASC) 10 MG tablet Take 1 tablet (10 mg total) by mouth daily.  90 tablet  1  . aspirin 81 MG EC tablet Take 81 mg by mouth daily. Swallow whole.      . benazepril (LOTENSIN) 20 MG tablet Take 1 tablet (20 mg total) by mouth daily.  90 tablet  3  . carvedilol (COREG) 25 MG tablet Take 1 tablet (25 mg total) by mouth 2 (two) times daily with a meal.  180 tablet  1  . doxazosin (CARDURA) 4 MG tablet Take 1 tablet (4 mg total) by mouth at bedtime.  90 tablet  1  . esomeprazole (NEXIUM) 40 MG capsule Take 40 mg by mouth daily at 12 noon.      Marland Kitchen glucose blood (ONE TOUCH ULTRA TEST) test strip Use as instructed to check blood sugars two times  a day dx code 250.02  100 each  5  . insulin aspart (NOVOLOG FLEXPEN) 100 UNIT/ML SOPN FlexPen Inject 3-9 units at breakfast, 3-4 units at lunch and 6-9 units at dinner  5 pen  5  . Insulin Detemir (LEVEMIR) 100 UNIT/ML Pen Inject 8-17 Units into the skin 2 (two) times daily. 8 units in am and 17 in pm      . insulin lispro (HUMALOG KWIKPEN) 100 UNIT/ML KiwkPen Inject 3-9 units at breakfast 3-4 units at lunch 6-9 units at dinner  15 mL  5  .  metFORMIN (GLUCOPHAGE-XR) 750 MG 24 hr tablet Take 2 tablets by mouth after dinner      . pioglitazone (ACTOS) 15 MG tablet Take 1 tablet (15 mg total) by mouth daily.  90 tablet  1  . rosuvastatin (CRESTOR) 5 MG tablet Take 5 mg by mouth every morning.      . vardenafil (LEVITRA) 20 MG tablet Take 1 tablet (20 mg total) by mouth daily as needed for erectile dysfunction.  3 tablet  3  . zolpidem (AMBIEN) 5 MG tablet Take 1 1/2 tablets every night at bedtime for sleep  45 tablet  5  . hyoscyamine (LEVSIN SL) 0.125 MG SL tablet Place 1 tablet (0.125 mg total) under the tongue every 4 (four) hours as needed.  30 tablet  1   No current facility-administered medications for this visit.    Allergies  Allergen Reactions  . Glycopyrrolate Rash    Family History  Problem Relation Age of Onset  . Diabetes Father   . Kidney disease Mother     History  Substance Use Topics  . Smoking status: Never Smoker   . Smokeless tobacco: Not on file  . Alcohol Use: Yes     Comment: minimal    ROS: As per history of present illness, otherwise negative  BP 128/72  Pulse 70  Ht 5\' 7"  (1.702 m)  Wt 161 lb (73.029 kg)  BMI 25.21 kg/m2 Constitutional: Well-developed and well-nourished. No distress. HEENT: Normocephalic and atraumatic. Oropharynx is clear and moist. No oropharyngeal exudate. Conjunctivae are normal.  No scleral icterus. Neck: Neck supple. Trachea midline. Cardiovascular: Normal rate, regular rhythm and intact distal pulses. No M/R/G Pulmonary/chest: Effort normal and breath sounds normal. No wheezing, rales or rhonchi. Abdominal: Soft, nontender, nondistended. Bowel sounds active throughout. There are no masses palpable. Extremities: no clubbing, cyanosis, or edema Lymphadenopathy: No cervical adenopathy noted. Neurological: Alert and oriented to person place and time. Skin: Skin is warm and dry. No rashes noted. Psychiatric: Normal mood and affect. Behavior is normal.  RELEVANT  LABS AND IMAGING: CBC    Component Value Date/Time   WBC 18.9* 04/29/2014 1338   WBC 15.5 Repeated and verified X2.* 08/31/2013 0846   RBC 4.45 04/29/2014 1338   RBC 4.42 08/31/2013 0846   HGB 13.7 04/29/2014 1338   HGB 13.6 08/31/2013 0846   HCT 42.3 04/29/2014 1338   HCT 40.4 08/31/2013 0846   PLT 232 04/29/2014 1338   PLT 204.0 08/31/2013 0846   MCV 95.1 04/29/2014 1338   MCV 91.4 08/31/2013 0846   MCH 30.8 04/29/2014 1338   MCH 31.4 08/28/2013 1130   MCHC 32.3 04/29/2014 1338   MCHC 33.8 08/31/2013 0846  RDW 14.1 04/29/2014 1338   RDW 13.9 08/31/2013 0846   LYMPHSABS 14.4* 04/29/2014 1338   LYMPHSABS 10.7* 08/31/2013 0846   MONOABS 0.7 04/29/2014 1338   MONOABS 0.7 08/31/2013 0846   EOSABS 0.3 04/29/2014 1338   EOSABS 0.3 08/31/2013 0846   BASOSABS 0.1 04/29/2014 1338   BASOSABS 0.1 08/31/2013 0846    CMP     Component Value Date/Time   NA 138 05/10/2014 1154   NA 140 01/25/2014 1318   K 4.4 05/10/2014 1154   K 4.3 01/25/2014 1318   CL 103 05/10/2014 1154   CO2 27 05/10/2014 1154   CO2 25 01/25/2014 1318   GLUCOSE 128* 05/10/2014 1154   GLUCOSE 156* 01/25/2014 1318   BUN 20 05/10/2014 1154   BUN 21.9 01/25/2014 1318   CREATININE 1.1 05/10/2014 1154   CREATININE 1.3 01/25/2014 1318   CALCIUM 9.4 05/10/2014 1154   CALCIUM 9.5 01/25/2014 1318   PROT 6.3 05/10/2014 1154   PROT 6.4 01/25/2014 1318   ALBUMIN 3.7 05/10/2014 1154   ALBUMIN 3.9 01/25/2014 1318   AST 14 05/10/2014 1154   AST 17 01/25/2014 1318   ALT 14 05/10/2014 1154   ALT 17 01/25/2014 1318   ALKPHOS 29* 05/10/2014 1154   ALKPHOS 40 01/25/2014 1318   BILITOT 0.7 05/10/2014 1154   BILITOT 0.42 01/25/2014 1318   GFRNONAA 73* 08/28/2013 1130   GFRAA 85* 08/28/2013 1130   Barium swallow reviewed 2013 -- small sliding hiatal hernia otherwise normal  ASSESSMENT/PLAN: 73 yo male with PMH of GERD, colon polyps, diabetes, hypertension, sleep apnea, and CLL who is seen in consultation at the request of Dr. Dwyane Dee to evaluate loose stools  1.  Loose  stools -- certainly this could be related to metformin, but he also could have a postinfectious irritable colon. He has had 2 colonoscopies and so he is up-to-date from a screening standpoint. We will request these records for completeness. I asked that he wait 2 weeks after making his change to the metformin regimen before determining if further treatment is necessary. If after making this change symptoms persist I would like him to start Florastor 250 mg twice a day and sublingual Levsin as directed and as needed. I would like to see him back in 8 weeks for reassessment. At this time we can review previous colonoscopies and determine if further testing is necessary. I will also check a TSH and celiac panel today  2.  GERD -- symptoms stable on Nexium 40 mg daily. He reports previous upper endoscopy with no report of Barrett's esophagus. Epigastric pressure does not seem to be GERD or GI related as it does not occur with eating and he has no alarm symptoms such as early satiety, nausea or vomiting.  3.  Exertional epigastric pressure associated with dyspnea -- given the fact that his abdominal pressure and "loss of his breath" occurs with exertion, along with his age and history of diabetes and hypertension I have recommended cardiology evaluation to exclude angina as a source of this pressure. He is agreeable to this consult he will be referred to cardiology.  Return in 8 weeks   Addendum: Records received Colonoscopy and EGD dated 03/26/2007, Dr. Earlean Shawl -- EGD hiatal hernia, large. Otherwise normal. Colonoscopy an 8 mm sessile mid right colon polyp removed with snare, extensive right and left diverticulosis, internal hemorrhoids treated with injection sclerotherapy. Pathology = tubular adenoma.  Colonoscopy dated 02/04/2011 -- no colorectal neoplasia. Universal diverticulosis, medium internal hemorrhoids. Repeat in 5  years  Based on the following daily do repeat colonoscopy in February 2017 for  screening/surveillance  Office followup as above

## 2014-05-13 NOTE — Patient Instructions (Addendum)
Once you make the changes to your Metformin as advised by you PCP and if this has not helped your loose stool issue after 2 weeks: Start taking Florastor twice a day and Levsin as directed.  Discontinue taking Align  Follow up with Dr. Hilarie Fredrickson in office in 8 weeks  You have been referred to Cardiology: you have an appointment on: 06/22/2014 With MCALHANY, CHRISTOPHER D,  Time:2:30pm   Please arrive 15 minutes prior to your appointment.  If you need to reschedule please call [phone]

## 2014-05-13 NOTE — Telephone Encounter (Signed)
rx sent

## 2014-05-17 LAB — TISSUE TRANSGLUTAMINASE, IGA: Tissue Transglutaminase Ab, IgA: 2 U/mL (ref <20)

## 2014-06-22 ENCOUNTER — Encounter: Payer: Self-pay | Admitting: Cardiovascular Disease

## 2014-06-22 ENCOUNTER — Ambulatory Visit (INDEPENDENT_AMBULATORY_CARE_PROVIDER_SITE_OTHER): Payer: Medicare Other | Admitting: Cardiovascular Disease

## 2014-06-22 ENCOUNTER — Other Ambulatory Visit: Payer: Self-pay | Admitting: *Deleted

## 2014-06-22 VITALS — BP 142/72 | HR 53 | Ht 67.5 in | Wt 163.0 lb

## 2014-06-22 DIAGNOSIS — R0789 Other chest pain: Secondary | ICD-10-CM

## 2014-06-22 DIAGNOSIS — R0600 Dyspnea, unspecified: Secondary | ICD-10-CM

## 2014-06-22 DIAGNOSIS — R0989 Other specified symptoms and signs involving the circulatory and respiratory systems: Secondary | ICD-10-CM

## 2014-06-22 DIAGNOSIS — R0609 Other forms of dyspnea: Secondary | ICD-10-CM

## 2014-06-22 MED ORDER — PIOGLITAZONE HCL 15 MG PO TABS: 15.0000 mg | ORAL_TABLET | Freq: Every day | ORAL | Status: DC

## 2014-06-22 NOTE — Patient Instructions (Signed)
Your physician recommends that you schedule a follow-up appointment in:  About 6 weeks.   Your physician has requested that you have an exercise stress myoview. For further information please visit [email or web address]. Please follow instruction sheet, as given.  Your physician has requested that you have an echocardiogram. Echocardiography is a painless test that uses sound waves to create images of your heart. It provides your doctor with information about the size and shape of your heart and how well your heart's chambers and valves are working. This procedure takes approximately one hour. There are no restrictions for this procedure.

## 2014-06-22 NOTE — Progress Notes (Signed)
History of Present Illness: 73 yo male with history of CLL, DM, OSA, HTN, HLD here today as a new patient for evaluation of exertional dyspnea. He tells me today that he has been having discomfort in the center of his chest over the last 3-4 months. This is associated with dyspnea with minimal exertion. He also notes dizziness with exertion, no syncope.   Primary Care Physician: Elayne Snare  Last Lipid Profile:Lipid Panel     Component Value Date/Time   CHOL 140 05/10/2014 1154   TRIG 65.0 05/10/2014 1154   HDL 52.40 05/10/2014 1154   CHOLHDL 3 05/10/2014 1154   VLDL 13.0 05/10/2014 1154   LDLCALC 75 05/10/2014 1154     Past Medical History  Diagnosis Date  . Diabetes mellitus without complication   . Hypertension   . Sleep apnea   . Lymphocytosis   . CLL (chronic lymphocytic leukemia)   . GERD (gastroesophageal reflux disease)   . Hyperlipidemia     Past Surgical History  Procedure Laterality Date  . Hemorrhoid surgery      Sclerotherapy  . Blepharoplasty      Current Outpatient Prescriptions  Medication Sig Dispense Refill  . amLODipine (NORVASC) 10 MG tablet Take 1 tablet (10 mg total) by mouth daily.  90 tablet  1  . aspirin 81 MG EC tablet Take 81 mg by mouth daily. Swallow whole.      . benazepril (LOTENSIN) 20 MG tablet Take 1 tablet (20 mg total) by mouth daily.  90 tablet  3  . carvedilol (COREG) 25 MG tablet Take 1 tablet (25 mg total) by mouth 2 (two) times daily with a meal.  180 tablet  1  . doxazosin (CARDURA) 4 MG tablet Take 1 tablet (4 mg total) by mouth at bedtime.  90 tablet  1  . esomeprazole (NEXIUM) 40 MG capsule Take 40 mg by mouth daily at 12 noon.      . hyoscyamine (LEVSIN SL) 0.125 MG SL tablet Place 1 tablet (0.125 mg total) under the tongue every 4 (four) hours as needed.  30 tablet  1  . Insulin Detemir (LEVEMIR) 100 UNIT/ML Pen Inject 8-17 Units into the skin 2 (two) times daily. 8 units in am and 17 in pm      . insulin lispro (HUMALOG  KWIKPEN) 100 UNIT/ML KiwkPen Inject 3-9 units at breakfast 3-4 units at lunch 6-9 units at dinner  15 mL  5  . metFORMIN (GLUCOPHAGE-XR) 750 MG 24 hr tablet Take 1 tablet (750 mg total) by mouth daily after supper. Take 2 tablets by mouth after dinner  60 tablet  5  . pioglitazone (ACTOS) 15 MG tablet Take 1 tablet (15 mg total) by mouth daily.  90 tablet  1  . rosuvastatin (CRESTOR) 5 MG tablet Take 5 mg by mouth every morning.      . vardenafil (LEVITRA) 20 MG tablet Take 1 tablet (20 mg total) by mouth daily as needed for erectile dysfunction.  3 tablet  3  . zolpidem (AMBIEN) 5 MG tablet Take 1 1/2 tablets every night at bedtime for sleep  45 tablet  5  . glucose blood (ONE TOUCH ULTRA TEST) test strip Use as instructed to check blood sugars two times  a day dx code 250.02  100 each  5   No current facility-administered medications for this visit.    Allergies  Allergen Reactions  . Glycopyrrolate Rash    History   Social History  .  Marital Status: Married    Spouse Name: N/A    Number of Children: 1  . Years of Education: N/A   Occupational History  . Retired-heating and air    Social History Main Topics  . Smoking status: Never Smoker   . Smokeless tobacco: Not on file  . Alcohol Use: Yes     Comment: minimal  . Drug Use: No  . Sexual Activity: Not on file   Other Topics Concern  . Not on file   Social History Narrative  . No narrative on file    Family History  Problem Relation Age of Onset  . Kidney disease Mother   . CAD Maternal Aunt     Review of Systems:  As stated in the HPI and otherwise negative.   BP 142/72  Pulse 53  Ht 5' 7.5" (1.715 m)  Wt 163 lb (73.936 kg)  BMI 25.14 kg/m2  Physical Examination: General: Well developed, well nourished, NAD HEENT: OP clear, mucus membranes moist SKIN: warm, dry. No rashes. Neuro: No focal deficits Musculoskeletal: Muscle strength 5/5 all ext Psychiatric: Mood and affect normal Neck: No JVD, no carotid  bruits, no thyromegaly, no lymphadenopathy. Lungs:Clear bilaterally, no wheezes, rhonci, crackles Cardiovascular: Regular rate and rhythm. No murmurs, gallops or rubs. Abdomen:Soft. Bowel sounds present. Non-tender.  Extremities: No lower extremity edema. Pulses are 2 + in the bilateral DP/PT.  EKG: Sinus bradycardia, rate 53 bpm.   Assessment and Plan:   1. Chest pain: Risk factors for CAD include DM, HTN, HLD, age. Symptoms concerning for unstable angina. Will arrange exercise stress myoview to exclude ischemia.   2. Dyspnea: Pulmonary exam ok. Cannot exclude angina, stress test as above. Echo to assess LV function and exclude valvular disease/structural heart disease.

## 2014-06-28 ENCOUNTER — Telehealth: Payer: Self-pay | Admitting: Internal Medicine

## 2014-06-28 NOTE — Telephone Encounter (Signed)
pt called to r/s cx appt...done pt aware of new d.t °

## 2014-07-06 ENCOUNTER — Ambulatory Visit (HOSPITAL_COMMUNITY): Payer: Medicare Other | Attending: Cardiovascular Disease | Admitting: Cardiology

## 2014-07-06 ENCOUNTER — Ambulatory Visit (HOSPITAL_BASED_OUTPATIENT_CLINIC_OR_DEPARTMENT_OTHER): Payer: Medicare Other | Admitting: Radiology

## 2014-07-06 VITALS — BP 150/69 | HR 58 | Ht 67.5 in | Wt 164.0 lb

## 2014-07-06 DIAGNOSIS — R0602 Shortness of breath: Secondary | ICD-10-CM

## 2014-07-06 DIAGNOSIS — R0609 Other forms of dyspnea: Secondary | ICD-10-CM | POA: Insufficient documentation

## 2014-07-06 DIAGNOSIS — R0600 Dyspnea, unspecified: Secondary | ICD-10-CM

## 2014-07-06 DIAGNOSIS — R0989 Other specified symptoms and signs involving the circulatory and respiratory systems: Secondary | ICD-10-CM | POA: Insufficient documentation

## 2014-07-06 DIAGNOSIS — R0789 Other chest pain: Secondary | ICD-10-CM

## 2014-07-06 DIAGNOSIS — R079 Chest pain, unspecified: Secondary | ICD-10-CM | POA: Insufficient documentation

## 2014-07-06 DIAGNOSIS — R42 Dizziness and giddiness: Secondary | ICD-10-CM | POA: Insufficient documentation

## 2014-07-06 MED ORDER — TECHNETIUM TC 99M SESTAMIBI GENERIC - CARDIOLITE
30.0000 | Freq: Once | INTRAVENOUS | Status: AC | PRN
  Administered 2014-07-06: 30 via INTRAVENOUS

## 2014-07-06 MED ORDER — TECHNETIUM TC 99M SESTAMIBI GENERIC - CARDIOLITE
10.0000 | Freq: Once | INTRAVENOUS | Status: AC | PRN
  Administered 2014-07-06: 10 via INTRAVENOUS

## 2014-07-06 NOTE — Progress Notes (Signed)
Echo performed. 

## 2014-07-06 NOTE — Progress Notes (Signed)
[facility] [address] [place], [place] [address] [phone]    Cardiology Nuclear Med Study  Tony Marshall is a 73 y.o. male     MRN : [id number]     DOB: 07/22/1941  Procedure Date: 07/06/2014  Nuclear Med Background Indication for Stress Test:  Evaluation for Ischemia History:  MPI ~8 yrs ago (normal per pt.) Cardiac Risk Factors: Hypertension, IDDM Type 2 and Lipids  Symptoms:  Chest Pain (last date of chest discomfort was yesterday), Dizziness and DOE   Nuclear Pre-Procedure Caffeine/Decaff Intake:  None NPO After: 7:00pm   Lungs:  clear O2 Sat: 95% on room air. IV 0.9% NS with Angio Cath:  22g  IV Site: R Hand  IV Started by:  Crissie Figures, RN  Chest Size (in):  44 Cup Size: n/a  Height: 5' 7.5" (1.715 m)  Weight:  164 lb (74.39 kg)  BMI:  Body mass index is 25.29 kg/(m^2). Tech Comments:  CBG@530AM = 108 , Coreg held x 24 hrs    Nuclear Med Study 1 or 2 day study: 1 day  Stress Test Type:  Stress  Reading MD: N/A  Order Authorizing Provider:  Lauree Chandler, MD  Resting Radionuclide: Technetium 9m Sestamibi  Resting Radionuclide Dose: 11.0 mCi   Stress Radionuclide:  Technetium 21m Sestamibi  Stress Radionuclide Dose: 33.0 mCi           Stress Protocol Rest HR: 58 Stress HR: 130  Rest BP: 150/69 Stress BP: 183/65  Exercise Time (min): 7:30 METS: 9.3           Dose of Adenosine (mg):  n/a Dose of Lexiscan: n/a mg  Dose of Atropine (mg): n/a Dose of Dobutamine: n/a mcg/kg/min (at max HR)  Stress Test Technologist: Glade Lloyd, BS-ES  Nuclear Technologist:  Vedia Pereyra, CNMT     Rest Procedure:  Myocardial perfusion imaging was performed at rest 45 minutes following the intravenous administration of Technetium 65m Sestamibi. Rest ECG: NSR - Normal EKG  Stress Procedure:  The patient exercised on the treadmill utilizing the Bruce Protocol for 7:30 minutes. The patient stopped due to fatigue and denied any  chest pain.  Technetium 79m Sestamibi was injected at peak exercise and myocardial perfusion imaging was performed after a brief delay. Stress ECG: No significant change from baseline ECG  QPS Raw Data Images:  Normal; no motion artifact; normal heart/lung ratio. Stress Images:  Normal homogeneous uptake in all areas of the myocardium. Rest Images:  Decreased uptake in the basal inferior wall with apical thinning. Subtraction (SDS):  No evidence of ischemia. Transient Ischemic Dilatation (Normal <1.22):  1.07 Lung/Heart Ratio (Normal <0.45):  0.26  Quantitative Gated Spect Images QGS EDV:  100 ml QGS ESV:  36 ml  Impression Exercise Capacity:  Good exercise capacity. BP Response:  Normal blood pressure response. Clinical Symptoms:  No significant symptoms noted. ECG Impression:  No significant ST segment change suggestive of ischemia. Comparison with Prior Nuclear Study: No images to compare  Overall Impression:  Normal stress nuclear study.  LV Ejection Fraction: 65%.  LV Wall Motion:  NL LV Function; NL Wall Motion  Signed: Fransico Him, MD [facility]

## 2014-07-08 ENCOUNTER — Telehealth: Payer: Self-pay | Admitting: Cardiovascular Disease

## 2014-07-08 NOTE — Telephone Encounter (Signed)
Lm to call back for results of echo and stress test.

## 2014-07-08 NOTE — Telephone Encounter (Signed)
Notified of stress test and echo results.

## 2014-07-08 NOTE — Telephone Encounter (Signed)
New message  ° ° °Patient calling back to speak with nurse  °

## 2014-07-10 ENCOUNTER — Other Ambulatory Visit: Payer: Self-pay | Admitting: Endocrinology

## 2014-07-11 ENCOUNTER — Other Ambulatory Visit: Payer: Self-pay | Admitting: *Deleted

## 2014-07-11 MED ORDER — ZOLPIDEM TARTRATE 5 MG PO TABS: ORAL_TABLET | ORAL | Status: DC

## 2014-07-13 ENCOUNTER — Telehealth: Payer: Self-pay | Admitting: Endocrinology

## 2014-07-13 ENCOUNTER — Telehealth: Payer: Self-pay | Admitting: *Deleted

## 2014-07-13 NOTE — Telephone Encounter (Signed)
We can send another rx in 3 mths

## 2014-07-13 NOTE — Telephone Encounter (Signed)
Patients wife called, she said the form the insurance company sent needed to be filled out, they told her that they would only fill the Ambien for 3 months unless they got notice from you saying he needed it longer.  Please advise.

## 2014-07-13 NOTE — Telephone Encounter (Signed)
Patient wife states that the insurance company sent over an [place] for his medication   Please advise    Thank You

## 2014-07-19 ENCOUNTER — Telehealth: Payer: Self-pay

## 2014-07-19 NOTE — Telephone Encounter (Signed)
Diabetic Bundle. Pt last BP check was 7/16. Pt coming for ov with Dr. Dwyane Dee on 08/12/2014. Pt will have bp recheck then.

## 2014-07-22 ENCOUNTER — Other Ambulatory Visit: Payer: Medicare Other

## 2014-07-22 ENCOUNTER — Ambulatory Visit: Payer: Medicare Other

## 2014-07-22 NOTE — Telephone Encounter (Signed)
Noted, left message on patients vm letting him know.

## 2014-07-22 NOTE — Telephone Encounter (Signed)
Sherri from [facility] stated that patient Tony Marshall has been approved.

## 2014-07-25 ENCOUNTER — Telehealth: Payer: Self-pay | Admitting: Internal Medicine

## 2014-07-25 NOTE — Telephone Encounter (Signed)
Pt confirmed labs/ov to r/s due to out of town...Marland KitchenMarland KitchenKJ

## 2014-07-27 ENCOUNTER — Other Ambulatory Visit: Payer: Self-pay | Admitting: Endocrinology

## 2014-07-27 ENCOUNTER — Telehealth: Payer: Self-pay | Admitting: Endocrinology

## 2014-07-27 NOTE — Telephone Encounter (Signed)
Pt needs [place] to call in rx asap benazetril 20 mg 90 day supply they are going out of town tomorrow and need it asap

## 2014-07-27 NOTE — Telephone Encounter (Signed)
rx sent

## 2014-07-29 ENCOUNTER — Other Ambulatory Visit: Payer: Medicare Other

## 2014-07-29 ENCOUNTER — Ambulatory Visit: Payer: Self-pay

## 2014-08-02 ENCOUNTER — Other Ambulatory Visit: Payer: Self-pay | Admitting: Endocrinology

## 2014-08-04 ENCOUNTER — Ambulatory Visit (HOSPITAL_BASED_OUTPATIENT_CLINIC_OR_DEPARTMENT_OTHER): Payer: Medicare Other | Admitting: Internal Medicine

## 2014-08-04 ENCOUNTER — Encounter: Payer: Self-pay | Admitting: Internal Medicine

## 2014-08-04 ENCOUNTER — Telehealth: Payer: Self-pay | Admitting: Internal Medicine

## 2014-08-04 ENCOUNTER — Other Ambulatory Visit (HOSPITAL_BASED_OUTPATIENT_CLINIC_OR_DEPARTMENT_OTHER): Payer: Medicare Other

## 2014-08-04 VITALS — BP 146/69 | HR 64 | Temp 98.3°F | Resp 18 | Ht 67.0 in | Wt 164.5 lb

## 2014-08-04 DIAGNOSIS — C9110 Chronic lymphocytic leukemia of B-cell type not having achieved remission: Secondary | ICD-10-CM

## 2014-08-04 DIAGNOSIS — E119 Type 2 diabetes mellitus without complications: Secondary | ICD-10-CM

## 2014-08-04 DIAGNOSIS — I10 Essential (primary) hypertension: Secondary | ICD-10-CM

## 2014-08-04 LAB — CBC WITH DIFFERENTIAL/PLATELET
BASO%: 0.3 % (ref 0.0–2.0)
BASOS ABS: 0.1 10*3/uL (ref 0.0–0.1)
EOS%: 1.3 % (ref 0.0–7.0)
Eosinophils Absolute: 0.3 10*3/uL (ref 0.0–0.5)
HCT: 41.7 % (ref 38.4–49.9)
HEMOGLOBIN: 13.5 g/dL (ref 13.0–17.1)
LYMPH%: 76.2 % — ABNORMAL HIGH (ref 14.0–49.0)
MCH: 30.9 pg (ref 27.2–33.4)
MCHC: 32.3 g/dL (ref 32.0–36.0)
MCV: 95.7 fL (ref 79.3–98.0)
MONO#: 0.7 10*3/uL (ref 0.1–0.9)
MONO%: 3.5 % (ref 0.0–14.0)
NEUT#: 4 10*3/uL (ref 1.5–6.5)
NEUT%: 18.7 % — ABNORMAL LOW (ref 39.0–75.0)
Platelets: 217 10*3/uL (ref 140–400)
RBC: 4.35 10*6/uL (ref 4.20–5.82)
RDW: 13.4 % (ref 11.0–14.6)
WBC: 21.4 10*3/uL — ABNORMAL HIGH (ref 4.0–10.3)
lymph#: 16.3 10*3/uL — ABNORMAL HIGH (ref 0.9–3.3)

## 2014-08-04 LAB — COMPREHENSIVE METABOLIC PANEL (CC13)
ALT: 15 U/L (ref 0–55)
AST: 13 U/L (ref 5–34)
Albumin: 3.5 g/dL (ref 3.5–5.0)
Alkaline Phosphatase: 47 U/L (ref 40–150)
Anion Gap: 9 meq/L (ref 3–11)
BUN: 20.7 mg/dL (ref 7.0–26.0)
CO2: 27 meq/L (ref 22–29)
Calcium: 9.3 mg/dL (ref 8.4–10.4)
Chloride: 105 meq/L (ref 98–109)
Creatinine: 1.1 mg/dL (ref 0.7–1.3)
Glucose: 144 mg/dL — ABNORMAL HIGH (ref 70–140)
Potassium: 4 meq/L (ref 3.5–5.1)
Sodium: 141 meq/L (ref 136–145)
Total Bilirubin: 0.32 mg/dL (ref 0.20–1.20)
Total Protein: 6.3 g/dL — ABNORMAL LOW (ref 6.4–8.3)

## 2014-08-04 LAB — TECHNOLOGIST REVIEW

## 2014-08-04 NOTE — Progress Notes (Signed)
[place], MD [address] [place] [address]  DIAGNOSIS: CLL (chronic lymphocytic leukemia) - Plan: CBC with Differential, CBC with Differential, Basic metabolic panel (Bmet) - [facility], Lactate dehydrogenase (LDH) - [facility]  Chief Complaint  Patient presents with  . CLL (chronic lymphocytic leukemia)    CURRENT THERAPY: Observation.   INTERVAL HISTORY: Tony Marshall 73 y.o. male with a history of diabetes mellitus wihtout complication (0093), hypertension and now newly diagnosed CLL.  He was last seen by me on 01/25/2014.    He is accompanied by his wife Tony Marshall.   He reports doing well overall.  He reports having an abdominal ultrasound and stress test that were negative.  He reports compliance to his medical therapy. He denies any viral illnesses or recent travels. He denies feeling enlarged lymph nodes anywhere on his body.  He denies any night sweats, weight lost or fevers.   MEDICAL HISTORY: Past Medical History  Diagnosis Date  . Diabetes mellitus without complication   . Hypertension   . Sleep apnea   . Lymphocytosis   . CLL (chronic lymphocytic leukemia)   . GERD (gastroesophageal reflux disease)   . Hyperlipidemia     INTERIM HISTORY: has Type II or unspecified type diabetes mellitus without mention of complication, uncontrolled; Unspecified essential hypertension; Pure hypercholesterolemia; Lymphocytosis; ED (erectile dysfunction); CLL (chronic lymphocytic leukemia); GERD (gastroesophageal reflux disease); Personal history of colonic polyps; and Hiatal hernia on his problem list.    ALLERGIES:  is allergic to glycopyrrolate.  MEDICATIONS: has a current medication list which includes the following prescription(s): amlodipine, aspirin, benazepril, carvedilol, doxazosin, esomeprazole, glucose blood, hyoscyamine, insulin detemir, insulin lispro, metformin, pioglitazone, rosuvastatin, saccharomyces boulardii,  vardenafil, and zolpidem.  SURGICAL HISTORY:  Past Surgical History  Procedure Laterality Date  . Hemorrhoid surgery      Sclerotherapy  . Blepharoplasty      REVIEW OF SYSTEMS:   Constitutional: Denies fevers, chills or abnormal weight loss Eyes: Denies blurriness of vision Ears, nose, mouth, throat, and face: Denies mucositis or sore throat Respiratory: Denies cough, dyspnea or wheezes Cardiovascular: Denies palpitation, chest discomfort or lower extremity swelling Gastrointestinal:  Denies nausea, heartburn or change in bowel habits Skin: Denies abnormal skin rashes Lymphatics: Denies new lymphadenopathy or easy bruising Neurological:Denies numbness, tingling or new weaknesses Behavioral/Psych: Mood is stable, no new changes  All other systems were reviewed with the patient and are negative.  PHYSICAL EXAMINATION: ECOG PERFORMANCE STATUS: 0 - Asymptomatic  Blood pressure 146/69, pulse 64, temperature 98.3 F (36.8 C), temperature source Oral, resp. rate 18, height 5' 7"  (1.702 m), weight 164 lb 8 oz (74.617 kg), SpO2 99.00%.  GENERAL:alert, no distress and comfortable; elderly male who appears his stated age.  SKIN: skin color, texture, turgor are normal, no rashes or significant lesions EYES: normal, Conjunctiva are pink and non-injected, sclera clear OROPHARYNX:no exudate, no erythema and lips, buccal mucosa, and tongue normal  NECK: supple, thyroid normal size, non-tender, without nodularity LYMPH:  no palpable lymphadenopathy in the cervical, axillary or supraclavicular LUNGS: clear to auscultation and percussion with normal breathing effort HEART: regular rate & rhythm and no murmurs and no lower extremity edema ABDOMEN:abdomen soft, non-tender and normal bowel sounds Musculoskeletal:no cyanosis of digits and no clubbing; R hand without digit #5.  NEURO: alert & oriented x 3 with fluent speech, no focal motor/sensory deficits   LABORATORY DATA: Results for orders  placed in visit on 08/04/14 (from the past 48  hour(s))  CBC WITH DIFFERENTIAL     Status: Abnormal   Collection Time    08/04/14  9:59 AM      Result Value Ref Range   WBC 21.4 (*) 4.0 - 10.3 10e3/uL   NEUT# 4.0  1.5 - 6.5 10e3/uL   HGB 13.5  13.0 - 17.1 g/dL   HCT 41.7  38.4 - 49.9 %   Platelets 217  140 - 400 10e3/uL   MCV 95.7  79.3 - 98.0 fL   MCH 30.9  27.2 - 33.4 pg   MCHC 32.3  32.0 - 36.0 g/dL   RBC 4.35  4.20 - 5.82 10e6/uL   RDW 13.4  11.0 - 14.6 %   lymph# 16.3 (*) 0.9 - 3.3 10e3/uL   MONO# 0.7  0.1 - 0.9 10e3/uL   Eosinophils Absolute 0.3  0.0 - 0.5 10e3/uL   Basophils Absolute 0.1  0.0 - 0.1 10e3/uL   NEUT% 18.7 (*) 39.0 - 75.0 %   LYMPH% 76.2 (*) 14.0 - 49.0 %   MONO% 3.5  0.0 - 14.0 %   EOS% 1.3  0.0 - 7.0 %   BASO% 0.3  0.0 - 2.0 %  COMPREHENSIVE METABOLIC PANEL (OM35)     Status: Abnormal   Collection Time    08/04/14  9:59 AM      Result Value Ref Range   Sodium 141  136 - 145 mEq/L   Potassium 4.0  3.5 - 5.1 mEq/L   Chloride 105  98 - 109 mEq/L   CO2 27  22 - 29 mEq/L   Glucose 144 (*) 70 - 140 mg/dl   BUN 20.7  7.0 - 26.0 mg/dL   Creatinine 1.1  0.7 - 1.3 mg/dL   Total Bilirubin 0.32  0.20 - 1.20 mg/dL   Alkaline Phosphatase 47  40 - 150 U/L   AST 13  5 - 34 U/L   ALT 15  0 - 55 U/L   Total Protein 6.3 (*) 6.4 - 8.3 g/dL   Albumin 3.5  3.5 - 5.0 g/dL   Calcium 9.3  8.4 - 10.4 mg/dL   Anion Gap 9  3 - 11 mEq/L  TECHNOLOGIST REVIEW     Status: None   Collection Time    08/04/14  9:59 AM      Result Value Ref Range   Technologist Review Variant lymphs present, smudge cells present      Labs:  Lab Results  Component Value Date   WBC 21.4* 08/04/2014   HGB 13.5 08/04/2014   HCT 41.7 08/04/2014   MCV 95.7 08/04/2014   PLT 217 08/04/2014   NEUTROABS 4.0 08/04/2014      Chemistry      Component Value Date/Time   NA 141 08/04/2014 0959   NA 138 05/10/2014 1154   K 4.0 08/04/2014 0959   K 4.4 05/10/2014 1154   CL 103 05/10/2014 1154   CO2 27  08/04/2014 0959   CO2 27 05/10/2014 1154   BUN 20.7 08/04/2014 0959   BUN 20 05/10/2014 1154   CREATININE 1.1 08/04/2014 0959   CREATININE 1.1 05/10/2014 1154      Component Value Date/Time   CALCIUM 9.3 08/04/2014 0959   CALCIUM 9.4 05/10/2014 1154   ALKPHOS 47 08/04/2014 0959   ALKPHOS 29* 05/10/2014 1154   AST 13 08/04/2014 0959   AST 14 05/10/2014 1154   ALT 15 08/04/2014 0959   ALT 14 05/10/2014 1154   BILITOT 0.32 08/04/2014 0959  BILITOT 0.7 05/10/2014 1154     CBC:  Recent Labs Lab 08/04/14 0959  WBC 21.4*  NEUTROABS 4.0  HGB 13.5  HCT 41.7  MCV 95.7  PLT 217   Microbiology Recent Results (from the past 240 hour(s))  TECHNOLOGIST REVIEW     Status: None   Collection Time    08/04/14  9:59 AM      Result Value Ref Range Status   Technologist Review Variant lymphs present, smudge cells present   Final   Interpretation Peripheral Blood Flow Cytometry - MONOCLONAL B-CELL POPULATION DETECTED. - SEE COMMENT. Diagnosis Comment: There is a monoclonal B-cell population with expression of B-cell markers and coexpression of CD5 and CD23. Kappa and lambda are too dim for evaluation. The phenotypic findings are consistent with chronic lymphocytic leukemia. Vicente Males MD Pathologist, Electronic Signature (Case signed 09/24/2013) GROSS AND MICROSCOPIC INFORMATION Source Peripheral Blood Flow Cytometry Microscopic Gated population: Flow cytometric immunophenotyping is performed using antibiodies to the antigens listed in the table below. Electronic gates are placed around a cell cluster displaying light scatter properties corresponding to lymphocytes. - Abnormal Cells in gated population: 84 % - Phenotype of Abnormal Cells: CD5, CD19, CD20, CD21, CD22, CD23, HLA-Dr Specimen Table Lymphoid Associated Myeloid Associated Misc. As CD2 neg CD19 pos CD11c ND CD45 ND CD3 neg CD20 pos CD13 ND HLA-DR pos CD4 neg CD21 pos CD14 ND CD10 neg CD5 pos CD22 pos CD15 ND CD56/16 ND CD7 neg  CD23 pos CD33 ND ZAP70 ND CD8 neg CD103 ND MPO ND CD34 ND CD25 ND FMC7 ND CD117 ND CD52 ND sKappa tested CD38 ND sLambda tested cKappa ND cLambda ND Gross Received from [facility] is one lavender tube labeled as PBS to evaluate for lymphocytosis.  RADIOGRAPHIC STUDIES: No results found.  ASSESSMENT: Tony Marshall 72 y.o. male with a history of CLL (chronic lymphocytic leukemia) - Plan: CBC with Differential, CBC with Differential, Basic metabolic panel (Bmet) - [facility], Lactate dehydrogenase (LDH) - [facility]   PLAN:   1. CLL Rai Stage O.   -- Repeat CBC is consistent with his prior CBC with a white blood cell count of 21.4 with variant lymphs present. Flow cytometry confirmed CLL as noted above.  He is asymptomatic and his remaining blood counts including his hemoglobin and platelets are normal. He was provided a detailed handout on CLL.   2. Follow-up.  Patient will return in three months for repeat laboratory testing and for a follow-up visit in 6 months with CBC and chemistries.   All questions were answered. The patient knows to call the clinic with any problems, questions or concerns. We can certainly see the patient much sooner if necessary.  I spent 10 minutes counseling the patient face to face. The total time spent in the appointment was 15 minutes.    Zacheriah Stumpe, MD 08/04/2014 2:28 PM

## 2014-08-04 NOTE — Telephone Encounter (Signed)
gv and printed appt sched and avs for pt for NOV and Feb 2016

## 2014-08-08 ENCOUNTER — Ambulatory Visit (INDEPENDENT_AMBULATORY_CARE_PROVIDER_SITE_OTHER): Payer: Medicare Other | Admitting: Cardiovascular Disease

## 2014-08-08 ENCOUNTER — Other Ambulatory Visit: Payer: Medicare Other

## 2014-08-08 ENCOUNTER — Encounter: Payer: Self-pay | Admitting: Cardiovascular Disease

## 2014-08-08 VITALS — BP 138/72 | HR 64 | Ht 67.0 in | Wt 166.8 lb

## 2014-08-08 DIAGNOSIS — R0600 Dyspnea, unspecified: Secondary | ICD-10-CM

## 2014-08-08 DIAGNOSIS — R0989 Other specified symptoms and signs involving the circulatory and respiratory systems: Secondary | ICD-10-CM

## 2014-08-08 DIAGNOSIS — R0609 Other forms of dyspnea: Secondary | ICD-10-CM

## 2014-08-08 DIAGNOSIS — R0789 Other chest pain: Secondary | ICD-10-CM

## 2014-08-08 NOTE — Progress Notes (Signed)
History of Present Illness: 73 yo male with history of CLL, DM, OSA, HTN, HLD here today for cardiac follow up. I saw him as a new patient for evaluation of exertional dyspnea on 06/22/14. He described central chest pain for 3-4 months. This is associated with dyspnea with minimal exertion. He also notes dizziness with exertion, no syncope. Stress myoview 07/06/14 with good exercise time, no chest pain or ischemic EKG changes, no ischemia on stress images. Echo 07/06/14 with normal LV size and function, no significant valve issues.   He is here today for follow up. His chest pain has improved. Rare mild chest pains. No change in dyspnea. No other complaints.   Primary Care Physician: Elayne Snare  Last Lipid Profile:Lipid Panel     Component Value Date/Time   CHOL 140 05/10/2014 1154   TRIG 65.0 05/10/2014 1154   HDL 52.40 05/10/2014 1154   CHOLHDL 3 05/10/2014 1154   VLDL 13.0 05/10/2014 1154   LDLCALC 75 05/10/2014 1154     Past Medical History  Diagnosis Date  . Diabetes mellitus without complication   . Hypertension   . Sleep apnea   . Lymphocytosis   . CLL (chronic lymphocytic leukemia)   . GERD (gastroesophageal reflux disease)   . Hyperlipidemia     Past Surgical History  Procedure Laterality Date  . Hemorrhoid surgery      Sclerotherapy  . Blepharoplasty      Current Outpatient Prescriptions  Medication Sig Dispense Refill  . amLODipine (NORVASC) 10 MG tablet Take 1 tablet (10 mg total) by mouth daily.  90 tablet  1  . aspirin 81 MG EC tablet Take 81 mg by mouth daily. Swallow whole.      . benazepril (LOTENSIN) 20 MG tablet TAKE ONE TABLET BY MOUTH ONCE DAILY  90 tablet  1  . carvedilol (COREG) 25 MG tablet Take 1 tablet (25 mg total) by mouth 2 (two) times daily with a meal.  180 tablet  1  . doxazosin (CARDURA) 4 MG tablet TAKE ONE TABLET BY MOUTH AT BEDTIME  90 tablet  0  . esomeprazole (NEXIUM) 40 MG capsule Take 40 mg by mouth daily at 12 noon.      Marland Kitchen glucose  blood (ONE TOUCH ULTRA TEST) test strip Use as instructed to check blood sugars two times  a day dx code 250.02  100 each  5  . hyoscyamine (LEVSIN SL) 0.125 MG SL tablet Place 1 tablet (0.125 mg total) under the tongue every 4 (four) hours as needed.  30 tablet  1  . Insulin Detemir (LEVEMIR) 100 UNIT/ML Pen Inject 8-17 Units into the skin 2 (two) times daily. 8 units in am and 10-12 in pm      . insulin lispro (HUMALOG KWIKPEN) 100 UNIT/ML KiwkPen Inject 3-9 units at breakfast 3-4 units at lunch 6-9 units at dinner  15 mL  5  . metFORMIN (GLUCOPHAGE-XR) 750 MG 24 hr tablet Take 1 tablet (750 mg total) by mouth daily after supper. Take 2 tablets by mouth after dinner  60 tablet  5  . pioglitazone (ACTOS) 15 MG tablet Take 1 tablet (15 mg total) by mouth daily.  90 tablet  1  . rosuvastatin (CRESTOR) 5 MG tablet Take 5 mg by mouth every morning.      . saccharomyces boulardii (FLORASTOR) 250 MG capsule Take 250 mg by mouth 2 (two) times daily.      . vardenafil (LEVITRA) 20 MG tablet Take 1  tablet (20 mg total) by mouth daily as needed for erectile dysfunction.  3 tablet  3  . zolpidem (AMBIEN) 5 MG tablet Take 1  tablet every night at bedtime for sleep  30 tablet  5   No current facility-administered medications for this visit.    Allergies  Allergen Reactions  . Glycopyrrolate Rash    History   Social History  . Marital Status: Married    Spouse Name: N/A    Number of Children: 1  . Years of Education: N/A   Occupational History  . Retired-heating and air    Social History Main Topics  . Smoking status: Never Smoker   . Smokeless tobacco: Not on file  . Alcohol Use: Yes     Comment: minimal  . Drug Use: No  . Sexual Activity: Not on file   Other Topics Concern  . Not on file   Social History Narrative  . No narrative on file    Family History  Problem Relation Age of Onset  . Kidney disease Mother   . CAD Maternal Aunt     Review of Systems:  As stated in the HPI  and otherwise negative.   BP 138/72  Pulse 64  Ht 5\' 7"  (1.702 m)  Wt 166 lb 12.8 oz (75.66 kg)  BMI 26.12 kg/m2  Physical Examination: General: Well developed, well nourished, NAD HEENT: OP clear, mucus membranes moist SKIN: warm, dry. No rashes. Neuro: No focal deficits Musculoskeletal: Muscle strength 5/5 all ext Psychiatric: Mood and affect normal Neck: No JVD, no carotid bruits, no thyromegaly, no lymphadenopathy. Lungs:Clear bilaterally, no wheezes, rhonci, crackles Cardiovascular: Regular rate and rhythm. No murmurs, gallops or rubs. Abdomen:Soft. Bowel sounds present. Non-tender.  Extremities: No lower extremity edema. Pulses are 2 + in the bilateral DP/PT.  Stress myoview 07/06/14: Stress Procedure: The patient exercised on the treadmill utilizing the Bruce Protocol for 7:30 minutes. The patient stopped due to fatigue and denied any chest pain. Technetium 34m Sestamibi was injected at peak exercise and myocardial perfusion imaging was performed after a brief delay.  Stress ECG: No significant change from baseline ECG  QPS  Raw Data Images: Normal; no motion artifact; normal heart/lung ratio.  Stress Images: Normal homogeneous uptake in all areas of the myocardium.  Rest Images: Decreased uptake in the basal inferior wall with apical thinning.  Subtraction (SDS): No evidence of ischemia.  Transient Ischemic Dilatation (Normal <1.22): 1.07  Lung/Heart Ratio (Normal <0.45): 0.26  Quantitative Gated Spect Images  QGS EDV: 100 ml  QGS ESV: 36 ml  Impression  Exercise Capacity: Good exercise capacity.  BP Response: Normal blood pressure response.  Clinical Symptoms: No significant symptoms noted.  ECG Impression: No significant ST segment change suggestive of ischemia.  Comparison with Prior Nuclear Study: No images to compare  Overall Impression: Normal stress nuclear study.  LV Ejection Fraction: 65%. LV Wall Motion: NL LV Function; NL Wall Motion  Echo 07/06/14: Left  ventricle: The cavity size was normal. Systolic function was normal. The estimated ejection fraction was in the range of 55% to 60%. Wall motion was normal; there were no regional wall motion abnormalities. Left ventricular diastolic function parameters were normal.  Assessment and Plan:   1. Chest pain: Risk factors for CAD include DM, HTN, HLD, age. Stress myoview 07/06/14 without ischemia. No further workup at this time. Pt will call if his symptoms change. One year follow up.   2. Dyspnea: LV function is normal. No valvular disease noted.  His dyspnea does not appear to be cardiac related.

## 2014-08-08 NOTE — Patient Instructions (Signed)
Your physician wants you to follow-up in:  12 months.  You will receive a reminder letter in the mail two months in advance. If you don't receive a letter, please call our office to schedule the follow-up appointment.   

## 2014-08-12 ENCOUNTER — Ambulatory Visit: Payer: Medicare Other | Admitting: Endocrinology

## 2014-08-12 ENCOUNTER — Other Ambulatory Visit: Payer: Medicare Other

## 2014-08-17 ENCOUNTER — Ambulatory Visit: Payer: Medicare Other | Admitting: Endocrinology

## 2014-08-17 ENCOUNTER — Other Ambulatory Visit: Payer: Self-pay

## 2014-08-23 ENCOUNTER — Other Ambulatory Visit (INDEPENDENT_AMBULATORY_CARE_PROVIDER_SITE_OTHER): Payer: Medicare Other

## 2014-08-23 ENCOUNTER — Other Ambulatory Visit: Payer: Self-pay | Admitting: *Deleted

## 2014-08-23 ENCOUNTER — Other Ambulatory Visit: Payer: Self-pay

## 2014-08-23 DIAGNOSIS — E7800 Pure hypercholesterolemia, unspecified: Secondary | ICD-10-CM

## 2014-08-23 DIAGNOSIS — IMO0001 Reserved for inherently not codable concepts without codable children: Secondary | ICD-10-CM

## 2014-08-23 DIAGNOSIS — E1165 Type 2 diabetes mellitus with hyperglycemia: Principal | ICD-10-CM

## 2014-08-23 LAB — COMPREHENSIVE METABOLIC PANEL
ALT: 18 U/L (ref 0–53)
AST: 16 U/L (ref 0–37)
Albumin: 3.5 g/dL (ref 3.5–5.2)
Alkaline Phosphatase: 41 U/L (ref 39–117)
BILIRUBIN TOTAL: 0.3 mg/dL (ref 0.2–1.2)
BUN: 16 mg/dL (ref 6–23)
CO2: 26 meq/L (ref 19–32)
CREATININE: 1.1 mg/dL (ref 0.4–1.5)
Calcium: 8.6 mg/dL (ref 8.4–10.5)
Chloride: 105 mEq/L (ref 96–112)
GFR: 70.19 mL/min (ref >60.00)
GLUCOSE: 175 mg/dL — AB (ref 70–99)
Potassium: 3.7 mEq/L (ref 3.5–5.1)
Sodium: 137 mEq/L (ref 135–145)
Total Protein: 6.2 g/dL (ref 6.0–8.3)

## 2014-08-23 LAB — LIPID PANEL
CHOLESTEROL: 123 mg/dL (ref 0–200)
HDL: 39.5 mg/dL (ref >39.00)
LDL Cholesterol: 57 mg/dL (ref 0–99)
NonHDL: 83.5
Total CHOL/HDL Ratio: 3
Triglycerides: 134 mg/dL (ref 0.0–149.0)
VLDL: 26.8 mg/dL (ref 0.0–40.0)

## 2014-08-23 LAB — HEMOGLOBIN A1C: HEMOGLOBIN A1C: 8 % — AB (ref 4.6–6.5)

## 2014-08-26 ENCOUNTER — Other Ambulatory Visit: Payer: Self-pay | Admitting: *Deleted

## 2014-08-26 ENCOUNTER — Ambulatory Visit (INDEPENDENT_AMBULATORY_CARE_PROVIDER_SITE_OTHER): Payer: Medicare Other | Admitting: Endocrinology

## 2014-08-26 ENCOUNTER — Encounter: Payer: Self-pay | Admitting: Endocrinology

## 2014-08-26 VITALS — BP 122/80 | HR 72 | Temp 97.8°F | Resp 14 | Ht 67.0 in | Wt 166.0 lb

## 2014-08-26 DIAGNOSIS — E1165 Type 2 diabetes mellitus with hyperglycemia: Principal | ICD-10-CM

## 2014-08-26 DIAGNOSIS — Z23 Encounter for immunization: Secondary | ICD-10-CM

## 2014-08-26 DIAGNOSIS — E7800 Pure hypercholesterolemia, unspecified: Secondary | ICD-10-CM

## 2014-08-26 DIAGNOSIS — I10 Essential (primary) hypertension: Secondary | ICD-10-CM

## 2014-08-26 DIAGNOSIS — IMO0001 Reserved for inherently not codable concepts without codable children: Secondary | ICD-10-CM

## 2014-08-26 MED ORDER — GLUCOSE BLOOD VI STRP: ORAL_STRIP | Status: DC

## 2014-08-26 NOTE — Patient Instructions (Addendum)
Check more after breakfast or lunch   When Levemir over start Toujeo 19 units in am and adjust based on am sugar levels   If sugar high before supper go up 1-2 on am Levemir

## 2014-08-26 NOTE — Progress Notes (Signed)
Patient ID: Tony Marshall, male   DOB: 1941-04-04, 73 y.o.   MRN: [id number]   Reason for Appointment:    History of Present Illness   1. Type 2 DIABETES MELITUS, date of diagnosis:  1992        His blood sugar has been  difficult to control over the last few years because of variability in his blood sugars On basal bolus insulin for a few years and his oral hypoglycemic drugs have been continued Also did not tolerate GLP-1 drugs or symlin because of nausea He is essentially looking like a a type I patient with requiring relatively low amounts of insulin and twice a day dosage of basal insulin He has done somewhat better with Levemir compared to Lantus and is using this twice a day also  Recent history: His blood sugar continues to be difficult to regulate and has significant fluctuation in his readings Again he does not monitor his blood sugar as often as directed He has reduced his Levemir insulin at night as he thinks that his sugars were getting low in the morning with 12 units  Problems identified and blood sugar patterns from review of his recent readings and history:   Fasting blood sugars are somewhat variable but mostly a little high, has not checked regularly in the last week  He had one episode of low sugar overnight which he cannot explain.  He thinks that he is adjusting his nighttime Levemir based on how much he is eating and sometimes based on his bedtime reading  His blood sugars are being checked mostly at bedtime and although these are variable they are overall averaging fairly good  Blood sugars tend to be relatively higher midday also  A1c has gone up to over 8% again  Occasionally eating out and may not be getting enough mealtime coverage especially with high fat meals  Not checking readings in the late afternoon or before supper to help adjust morning Levemir and do a correction dose for evening insulin  Hypoglycemia: 1 episodes at 2:30 AM           Oral  hypoglycemic drugs: Actos, Metformin        Side effects from medications: None Insulin regimen: Levemir insulin 9 units in the morning and 8-9 in the evening, mealtime insulin: Mealtime insulin: Breakfast = 0-3 units,  supper 6-10 units based on meal size         Proper timing of medications in relation to meals: Yes.          Monitors blood glucose: QD    Glucometer: One Touch.          Blood Glucose readings from one touch zoom software download:   PREMEAL Breakfast Lunch Dinner Bedtime Overall  Glucose range:  123-196   127-234   138   108-216    Mean/median:   164    157   160    Meals: 2-3 meals per day.  he is eating less fried and starchy foods overall and a light meal or snack at lunch . Has dinner at 7-8 PM    Physical activity: exercise: Golf, gardening            LABS:  Lab Results  Component Value Date   HGBA1C 8.0* 08/23/2014   HGBA1C 7.7* 05/10/2014   HGBA1C 8.0* 01/18/2014   Lab Results  Component Value Date   MICROALBUR 1.3 05/10/2014   LDLCALC 57 08/23/2014   CREATININE 1.1 08/23/2014  2. Hypertension:    His blood pressure has been high for about 30 years.  He was evaluated in 1/08 with urine metanephrines and renal artery ultrasound.  Recent home blood pressure about 120-130/70, checking periodically. BP has been well controlled consistently over the last few years     Appointment on 08/23/2014  Component Date Value Ref Range Status  . Sodium 08/23/2014 137  135 - 145 mEq/L Final  . Potassium 08/23/2014 3.7  3.5 - 5.1 mEq/L Final  . Chloride 08/23/2014 105  96 - 112 mEq/L Final  . CO2 08/23/2014 26  19 - 32 mEq/L Final  . Glucose, Bld 08/23/2014 175* 70 - 99 mg/dL Final  . BUN 08/23/2014 16  6 - 23 mg/dL Final  . Creatinine, Ser 08/23/2014 1.1  0.4 - 1.5 mg/dL Final  . Total Bilirubin 08/23/2014 0.3  0.2 - 1.2 mg/dL Final  . Alkaline Phosphatase 08/23/2014 41  39 - 117 U/L Final  . AST 08/23/2014 16  0 - 37 U/L Final  . ALT 08/23/2014 18  0 - 53 U/L Final   . Total Protein 08/23/2014 6.2  6.0 - 8.3 g/dL Final  . Albumin 08/23/2014 3.5  3.5 - 5.2 g/dL Final  . Calcium 08/23/2014 8.6  8.4 - 10.5 mg/dL Final  . GFR 08/23/2014 70.19  >60.00 mL/min Final  . Cholesterol 08/23/2014 123  0 - 200 mg/dL Final   ATP III Classification       Desirable:  < 200 mg/dL               Borderline High:  200 - 239 mg/dL          High:  > = 240 mg/dL  . Triglycerides 08/23/2014 134.0  0.0 - 149.0 mg/dL Final   Normal:  <150 mg/dLBorderline High:  150 - 199 mg/dL  . HDL 08/23/2014 39.50  >39.00 mg/dL Final  . VLDL 08/23/2014 26.8  0.0 - 40.0 mg/dL Final  . LDL Cholesterol 08/23/2014 57  0 - 99 mg/dL Final  . Total CHOL/HDL Ratio 08/23/2014 3   Final                  Men          Women1/2 Average Risk     3.4          3.3Average Risk          5.0          4.42X Average Risk          9.6          7.13X Average Risk          15.0          11.0                      . NonHDL 08/23/2014 83.50   Final   NOTE:  Non-HDL goal should be 30 mg/dL higher than patient's LDL goal (i.e. LDL goal of < 70 mg/dL, would have non-HDL goal of < 100 mg/dL)  . Hemoglobin A1C 08/23/2014 8.0* 4.6 - 6.5 % Final   Glycemic Control Guidelines for People with Diabetes:Non Diabetic:  <6%Goal of Therapy: <7%Additional Action Suggested:  >8%       Medication List       This list is accurate as of: 08/26/14  8:46 AM.  Always use your most recent med list.  amLODipine 10 MG tablet  Commonly known as:  NORVASC  Take 1 tablet (10 mg total) by mouth daily.     aspirin 81 MG EC tablet  Take 81 mg by mouth daily. Swallow whole.     benazepril 20 MG tablet  Commonly known as:  LOTENSIN  TAKE ONE TABLET BY MOUTH ONCE DAILY     carvedilol 25 MG tablet  Commonly known as:  COREG  Take 1 tablet (25 mg total) by mouth 2 (two) times daily with a meal.     doxazosin 4 MG tablet  Commonly known as:  CARDURA  TAKE ONE TABLET BY MOUTH AT BEDTIME     esomeprazole 40 MG capsule   Commonly known as:  NEXIUM  Take 40 mg by mouth daily at 12 noon.     FLORASTOR 250 MG capsule  Generic drug:  saccharomyces boulardii  Take 250 mg by mouth 2 (two) times daily.     glucose blood test strip  Commonly known as:  ONE TOUCH ULTRA TEST  Use as instructed to check blood sugars two times  a day dx code 250.02     hyoscyamine 0.125 MG SL tablet  Commonly known as:  LEVSIN SL  Place 1 tablet (0.125 mg total) under the tongue every 4 (four) hours as needed.     Insulin Detemir 100 UNIT/ML Pen  Commonly known as:  LEVEMIR  Inject 8-17 Units into the skin 2 (two) times daily. 9 units in am and 8-10 units in pm     insulin lispro 100 UNIT/ML KiwkPen  Commonly known as:  HUMALOG KWIKPEN  Inject 3-9 units at breakfast 3-4 units at lunch 6-9 units at dinner     metFORMIN 750 MG 24 hr tablet  Commonly known as:  GLUCOPHAGE-XR  Take 750 mg by mouth daily. Take 2 tablets by mouth after dinner     pioglitazone 15 MG tablet  Commonly known as:  ACTOS  Take 1 tablet (15 mg total) by mouth daily.     rosuvastatin 5 MG tablet  Commonly known as:  CRESTOR  Take 5 mg by mouth every morning.     vardenafil 20 MG tablet  Commonly known as:  LEVITRA  Take 1 tablet (20 mg total) by mouth daily as needed for erectile dysfunction.     zolpidem 5 MG tablet  Commonly known as:  AMBIEN  Take 1  tablet every night at bedtime for sleep        Allergies:  Allergies  Allergen Reactions  . Glycopyrrolate Rash    Past Medical History  Diagnosis Date  . Diabetes mellitus without complication   . Hypertension   . Sleep apnea   . Lymphocytosis   . CLL (chronic lymphocytic leukemia)   . GERD (gastroesophageal reflux disease)   . Hyperlipidemia     Past Surgical History  Procedure Laterality Date  . Hemorrhoid surgery      Sclerotherapy  . Blepharoplasty      Family History  Problem Relation Age of Onset  . Kidney disease Mother   . CAD Maternal Aunt     Social  History:  reports that he has never smoked. He does not have any smokeless tobacco history on file. He reports that he drinks alcohol. He reports that he does not use illicit drugs.  Review of Systems:  Review of Systems  Respiratory: Positive for shortness of breath.        He sometimes gets short of breath on  exertion but his cardiac relation has been negative and he was referred back to his gastroenterologist    He previously has had problems with reflux and dysphagia  HYPERLIPIDEMIA: The lipid abnormality consists of elevated LDL which is very well controlled with low-dose Crestor  Lab Results  Component Value Date   CHOL 123 08/23/2014   HDL 39.50 08/23/2014   LDLCALC 57 08/23/2014   TRIG 134.0 08/23/2014   CHOLHDL 3 08/23/2014    Last colonoscopy was in April 2012    He has mild CLL which is followed is by the hematologist and no treatment currently needed Recent WBC count about 21,000  Chronic insomnia present  Diagnosed with sleep apnea (11/09) treated with CPAP. Sleeps better and less daytime fatigue.  Diabetic complications: No significant neuropathy, does have erectile dysfunction, no evidence of nephropathy or history of retinopathy    Examination:   BP 122/80  Pulse 72  Temp(Src) 97.8 F (36.6 C)  Resp 14  Ht 5\' 7"  (1.702 m)  Wt 166 lb (75.297 kg)  BMI 25.99 kg/m2  SpO2 97%  Body mass index is 25.99 kg/(m^2).   Not indicated  ASSESSMENT/ PLAN:   Diabetes type 2, inadequately controlled with persistently high A1c  Problems identified and glucose patterns are outlined in the history of present illness Most of his hyperglycemia is probably related to overnight high readings as well as probably inadequate coverage of breakfast and snacks He is somewhat afraid of nocturnal hypoglycemia and probably not taking enough Levemir in the evening because of this Also is not monitoring of blood sugars at various times of the day as directed, he thinks he needs more test  strips on his prescription Discussed day-to-day management of his diabetes including balanced meals and insulin adjustment of both basal and bolus Will increase his prescription for test strips  When he finishes his Levemir will be given a trial of Toujeo 18-19 units once a day in the morning. Given him information on the type of insulin this is, how this works and method of titration based on fasting reading and over 3 days He will continue to adjust his mealtime insulin based on meal sizes and carbohydrate intake  HYPERTENSION: Well controlled and will continue same regimen, he will also checked periodically at home  Counseling time over 50% of today's 25 minute visit  Ivone Licht 08/26/2014, 8:46 AM

## 2014-09-20 ENCOUNTER — Other Ambulatory Visit: Payer: Self-pay | Admitting: Endocrinology

## 2014-09-26 ENCOUNTER — Other Ambulatory Visit: Payer: Self-pay | Admitting: *Deleted

## 2014-09-26 MED ORDER — GLUCOSE BLOOD VI STRP: ORAL_STRIP | Status: DC

## 2014-10-04 ENCOUNTER — Encounter: Payer: Self-pay | Admitting: Internal Medicine

## 2014-10-04 ENCOUNTER — Ambulatory Visit (INDEPENDENT_AMBULATORY_CARE_PROVIDER_SITE_OTHER): Payer: Medicare Other | Admitting: Internal Medicine

## 2014-10-04 VITALS — BP 120/76 | HR 56 | Ht 67.5 in | Wt 168.0 lb

## 2014-10-04 DIAGNOSIS — R195 Other fecal abnormalities: Secondary | ICD-10-CM

## 2014-10-04 DIAGNOSIS — K449 Diaphragmatic hernia without obstruction or gangrene: Secondary | ICD-10-CM

## 2014-10-04 DIAGNOSIS — Z860100 Personal history of colon polyps, unspecified: Secondary | ICD-10-CM

## 2014-10-04 DIAGNOSIS — K648 Other hemorrhoids: Secondary | ICD-10-CM

## 2014-10-04 MED ORDER — HYDROCORTISONE ACETATE 25 MG RE SUPP: 25.0000 mg | Freq: Two times a day (BID) | RECTAL | Status: DC

## 2014-10-04 NOTE — Patient Instructions (Signed)
We have sent the following medications to your pharmacy for you to pick up at your convenience:  Anusol HC Suppositories  Continue the Florastor.  You will be contacted regarding the internal hemorrhoid banding.  Eat smaller, more frequent meals  Please follow up with Dr. Hilarie Fredrickson as needed

## 2014-10-04 NOTE — Progress Notes (Signed)
Subjective:    Patient ID: Tony Marshall, male    DOB: 1941/04/20, 73 y.o.   MRN: [id number]  HPI Tony Marshall is a 73 year old male with a past medical history of GERD, colon polyps, diabetes, hypertension, sleep apnea and CLL who is seen in followup. He was previously seen to evaluate loose stools and he is here alone today. After his last visit his metformin administration, smooth and he was started on Florastor 250 mg twice daily. He reports this has significantly helped and resolved his loose stools. He is very happy with the response. He tried stopping the Florastor and after about 2 weeks for loose stools return so he restarted it with again good result. Bowel movements are occurring one to 2 times daily with more form and are no longer loose. No blood in his stool or melena recently. He denies abdominal pain. He does report occasional hemorrhoidal discomfort and itching along with occasional red blood with wiping. He reports he has a long history of internal hemorrhoids and he is interested in banding. GERD has been well controlled but occasionally he does have issues of upper abdominal pressure and fullness after eating, particularly heavy or large meals. He reports it feels like it takes "a while for his esophagus to empty". I had referred him to cardiology for evaluation of dyspnea and chest discomfort. He was seen and had a negative stress test and echocardiogram which revealed normal ventricular function. He is not having further chest pain.   Review of Systems As per history of present illness, otherwise negative  Current Medications, Allergies, Past Medical History, Past Surgical History, Family History and Social History were reviewed in [employer] record.     Objective:   Physical Exam BP 120/76  Pulse 56  Ht 5' 7.5" (1.715 m)  Wt 168 lb (76.204 kg)  BMI 25.91 kg/m2 Constitutional: Well-developed and well-nourished. No distress. HEENT:  Normocephalic and atraumatic. Oropharynx is clear and moist. No oropharyngeal exudate. Conjunctivae are normal.  No scleral icterus. Cardiovascular: Normal rate, regular rhythm and intact distal pulses. No M/R/G Pulmonary/chest: Effort normal and breath sounds normal. No wheezing, rales or rhonchi. Abdominal: Soft, nontender, nondistended. Bowel sounds active throughout.  Extremities: no clubbing, cyanosis, or edema Neurological: Alert and oriented to person place and time. Psychiatric: Normal mood and affect. Behavior is normal.  Colonoscopy and EGD dated 03/26/2007, Dr. Earlean Marshall -- EGD hiatal hernia, large. Otherwise normal. Colonoscopy an 8 mm sessile mid right colon polyp removed with snare, extensive right and left diverticulosis, internal hemorrhoids treated with injection sclerotherapy. Pathology = tubular adenoma.    Colonoscopy dated 02/04/2011 -- no colorectal neoplasia. Universal diverticulosis, medium internal hemorrhoids. Repeat in 5 years     Assessment & Plan:  73 year old male with a past medical history of GERD, colon polyps, diabetes, hypertension, sleep apnea and CLL who is seen in followup.  1. Loose stools -- likely in part metformin related though he has had response to moving the metformin dosing time and also adding Florastor. He prefers to continue Florastor and I have instructed him that this is okay. He can use 250 mg twice daily, but if necessary due to expense he can try once a day. He is happy with this plan  2. Upper abdominal pressure -- not a new symptom, and likely secondary to large hiatal hernia seen previously by endoscopy and barium swallow. He denies true dysphagia and therefore we are not proceeding to upper endoscopy at this time. I recommended  smaller more frequent meals. He will call if this becomes more of a problem at which time my recommendation of the upper endoscopy  3.  Internal hemorrhoids -- history of internal hemorrhoids seen at each of his  colonoscopies. I will send be trained for in-office internal hemorrhoid banding. He is interested in having me perform this in my office will contact him when the sessions are arranged. In the interim Anusol-HC suppositories 25 mg twice a day x5 days  4.  Hx of colon polyps -- surveillance colonoscopy recommended February 2017

## 2014-10-12 ENCOUNTER — Telehealth: Payer: Self-pay | Admitting: Endocrinology

## 2014-10-12 ENCOUNTER — Other Ambulatory Visit: Payer: Self-pay | Admitting: *Deleted

## 2014-10-12 MED ORDER — INSULIN GLARGINE 300 UNIT/ML ~~LOC~~ SOPN: 18.0000 [IU] | PEN_INJECTOR | Freq: Every morning | SUBCUTANEOUS | Status: DC

## 2014-10-12 NOTE — Telephone Encounter (Signed)
rx sent

## 2014-10-12 NOTE — Telephone Encounter (Signed)
Patient need prescription toujeo sent to his pharmacy.

## 2014-10-24 ENCOUNTER — Other Ambulatory Visit: Payer: Self-pay | Admitting: Otolaryngology

## 2014-10-24 DIAGNOSIS — J320 Chronic maxillary sinusitis: Secondary | ICD-10-CM

## 2014-10-27 ENCOUNTER — Other Ambulatory Visit: Payer: Self-pay | Admitting: *Deleted

## 2014-10-27 ENCOUNTER — Telehealth: Payer: Self-pay | Admitting: Endocrinology

## 2014-10-27 MED ORDER — BENAZEPRIL HCL 20 MG PO TABS: ORAL_TABLET | ORAL | Status: DC

## 2014-10-27 NOTE — Telephone Encounter (Signed)
rx sent

## 2014-10-27 NOTE — Telephone Encounter (Signed)
Patient would like a refill sent to his pharmacy   Benzapril 20 mg 90 day   Pharmacy: [facility] [phone]   Thank you

## 2014-10-31 ENCOUNTER — Other Ambulatory Visit: Payer: Self-pay | Admitting: Endocrinology

## 2014-11-02 ENCOUNTER — Other Ambulatory Visit: Payer: Self-pay | Admitting: Neurology

## 2014-11-02 ENCOUNTER — Other Ambulatory Visit: Payer: Self-pay | Admitting: *Deleted

## 2014-11-02 DIAGNOSIS — C9110 Chronic lymphocytic leukemia of B-cell type not having achieved remission: Secondary | ICD-10-CM

## 2014-11-02 DIAGNOSIS — R51 Headache: Principal | ICD-10-CM

## 2014-11-02 DIAGNOSIS — R519 Headache, unspecified: Secondary | ICD-10-CM

## 2014-11-03 ENCOUNTER — Other Ambulatory Visit (HOSPITAL_BASED_OUTPATIENT_CLINIC_OR_DEPARTMENT_OTHER): Payer: Medicare Other

## 2014-11-03 DIAGNOSIS — C9110 Chronic lymphocytic leukemia of B-cell type not having achieved remission: Secondary | ICD-10-CM

## 2014-11-03 LAB — CBC WITH DIFFERENTIAL/PLATELET
BASO%: 0.4 % (ref 0.0–2.0)
Basophils Absolute: 0.1 10*3/uL (ref 0.0–0.1)
EOS%: 1.3 % (ref 0.0–7.0)
Eosinophils Absolute: 0.3 10*3/uL (ref 0.0–0.5)
HEMATOCRIT: 41.4 % (ref 38.4–49.9)
HGB: 13.2 g/dL (ref 13.0–17.1)
LYMPH%: 80.5 % — AB (ref 14.0–49.0)
MCH: 29.6 pg (ref 27.2–33.4)
MCHC: 31.9 g/dL — AB (ref 32.0–36.0)
MCV: 92.9 fL (ref 79.3–98.0)
MONO#: 0.9 10*3/uL (ref 0.1–0.9)
MONO%: 3.6 % (ref 0.0–14.0)
NEUT#: 3.7 10*3/uL (ref 1.5–6.5)
NEUT%: 14.2 % — AB (ref 39.0–75.0)
PLATELETS: 215 10*3/uL (ref 140–400)
RBC: 4.45 10*6/uL (ref 4.20–5.82)
RDW: 13.6 % (ref 11.0–14.6)
WBC: 26 10*3/uL — AB (ref 4.0–10.3)
lymph#: 20.9 10*3/uL — ABNORMAL HIGH (ref 0.9–3.3)

## 2014-11-03 LAB — COMPREHENSIVE METABOLIC PANEL (CC13)
ALK PHOS: 49 U/L (ref 40–150)
ALT: 15 U/L (ref 0–55)
AST: 15 U/L (ref 5–34)
Albumin: 3.6 g/dL (ref 3.5–5.0)
Anion Gap: 6 mEq/L (ref 3–11)
BILIRUBIN TOTAL: 0.39 mg/dL (ref 0.20–1.20)
BUN: 14.9 mg/dL (ref 7.0–26.0)
CO2: 30 mEq/L — ABNORMAL HIGH (ref 22–29)
CREATININE: 1 mg/dL (ref 0.7–1.3)
Calcium: 9.3 mg/dL (ref 8.4–10.4)
Chloride: 108 mEq/L (ref 98–109)
Glucose: 72 mg/dl (ref 70–140)
Potassium: 4.2 mEq/L (ref 3.5–5.1)
SODIUM: 143 meq/L (ref 136–145)
TOTAL PROTEIN: 6.2 g/dL — AB (ref 6.4–8.3)

## 2014-11-07 ENCOUNTER — Ambulatory Visit
Admission: RE | Admit: 2014-11-07 | Discharge: 2014-11-07 | Disposition: A | Discharge status: Home / Self Care | Payer: Medicare Other | Source: Ambulatory Visit | Attending: Neurology | Admitting: Neurology

## 2014-11-07 DIAGNOSIS — R51 Headache: Principal | ICD-10-CM

## 2014-11-07 DIAGNOSIS — R519 Headache, unspecified: Secondary | ICD-10-CM

## 2014-11-15 ENCOUNTER — Other Ambulatory Visit: Payer: Self-pay | Admitting: Endocrinology

## 2014-11-16 ENCOUNTER — Other Ambulatory Visit: Payer: Self-pay | Admitting: *Deleted

## 2014-11-16 MED ORDER — ZOLPIDEM TARTRATE 10 MG PO TABS: ORAL_TABLET | ORAL | Status: DC

## 2014-11-21 ENCOUNTER — Other Ambulatory Visit: Payer: Self-pay | Admitting: *Deleted

## 2014-11-21 MED ORDER — INSULIN DETEMIR 100 UNIT/ML FLEXPEN: 8.0000 [IU] | PEN_INJECTOR | Freq: Two times a day (BID) | SUBCUTANEOUS | Status: DC

## 2014-11-22 ENCOUNTER — Other Ambulatory Visit (INDEPENDENT_AMBULATORY_CARE_PROVIDER_SITE_OTHER): Payer: Medicare Other

## 2014-11-22 DIAGNOSIS — IMO0002 Reserved for concepts with insufficient information to code with codable children: Secondary | ICD-10-CM

## 2014-11-22 DIAGNOSIS — E1165 Type 2 diabetes mellitus with hyperglycemia: Secondary | ICD-10-CM

## 2014-11-22 LAB — BASIC METABOLIC PANEL
BUN: 18 mg/dL (ref 6–23)
CHLORIDE: 106 meq/L (ref 96–112)
CO2: 27 meq/L (ref 19–32)
CREATININE: 1.2 mg/dL (ref 0.4–1.5)
Calcium: 9 mg/dL (ref 8.4–10.5)
GFR: 65.32 mL/min (ref >60.00)
Glucose, Bld: 225 mg/dL — ABNORMAL HIGH (ref 70–99)
Potassium: 4.2 mEq/L (ref 3.5–5.1)
Sodium: 138 mEq/L (ref 135–145)

## 2014-11-22 LAB — HEMOGLOBIN A1C: Hgb A1c MFr Bld: 8 % — ABNORMAL HIGH (ref 4.6–6.5)

## 2014-11-25 ENCOUNTER — Ambulatory Visit: Payer: Medicare Other | Admitting: Endocrinology

## 2014-11-30 ENCOUNTER — Encounter: Payer: Self-pay | Admitting: Endocrinology

## 2014-11-30 ENCOUNTER — Ambulatory Visit (INDEPENDENT_AMBULATORY_CARE_PROVIDER_SITE_OTHER): Payer: Medicare Other | Admitting: Endocrinology

## 2014-11-30 VITALS — BP 135/78 | HR 60 | Temp 98.0°F | Resp 14 | Ht 67.5 in | Wt 170.8 lb

## 2014-11-30 DIAGNOSIS — I10 Essential (primary) hypertension: Secondary | ICD-10-CM

## 2014-11-30 DIAGNOSIS — IMO0002 Reserved for concepts with insufficient information to code with codable children: Secondary | ICD-10-CM

## 2014-11-30 DIAGNOSIS — E1165 Type 2 diabetes mellitus with hyperglycemia: Secondary | ICD-10-CM

## 2014-11-30 NOTE — Patient Instructions (Addendum)
Toujeo 18 units at dinner and adjust dose based on am sugar trends  Check more 2 hrs after breakfast, keep it from rising over 50 mg

## 2014-11-30 NOTE — Progress Notes (Signed)
Patient ID: Tony Marshall, male   DOB: [birth year], 73 y.o.   MRN: [id number]   Reason for Appointment:    History of Present Illness   1. Type 2 DIABETES MELITUS, date of diagnosis:  1992        His blood sugar has been  difficult to control over the last few years because of variability in his blood sugars On basal bolus insulin for a few years and his oral hypoglycemic drugs have been continued Also did not tolerate GLP-1 drugs or symlin because of nausea He is essentially looking like a a type I patient with requiring relatively low amounts of insulin and twice a day dosage of basal insulin He has done somewhat better with Levemir compared to Lantus and is using this twice a day also  Recent history:  He appears to have significantly improved blood sugar control especially fasting with using Toujeo instead of Levemir twice a day Recent blood sugar patterns:  His blood sugars are excellent in the mornings before breakfast although not clear if some of the readings around 11 AM I before or after eating breakfast  Mostly blood sugars are high after breakfast and rarely after lunch  He has not changed the initially prescribed dose much and has gone up only 1-2 units of Toujeo  He is still tending to have some hypoglycemia but this is mostly right before supper time; may be partly related to his eating relatively small lunch and being active in the afternoon  Blood sugars after supper are generally fairly good and more consistent than before  No overnight hypoglycemia  Recent median glucose is only 111 compared to 160 previously on his download  Hypoglycemia:  has several low readings in the 50s before supper and once after supper  Mealtime insulin: He is taking variable doses at breakfast based on what his blood sugar is but not targeting his postprandial readings Appears to be doing a little less insulin for suppertime coverage than before and he thinks this is from trying to eat  more healthy meals      Oral hypoglycemic drugs: Actos, Metformin        Side effects from medications: None Insulin regimen: Toujeo insulin 20 units in the morning; mealtime insulin: Mealtime insulin: Breakfast = 2-4 units,  supper 5-7 units based on meal size         Proper timing of medications in relation to meals: Yes.          Monitors blood glucose: QD    Glucometer: One Touch.          Blood Glucose readings from one touch zoom software download:   PRE-MEAL Breakfast Lunch Dinner Bedtime Overall  Glucose range:  78-274  ?    51-75   108-175    median:  126     111   POST-MEAL PC Breakfast PC Lunch PC Dinner  Glucose range:  169-281   94-204    53, 89   Mean/median:       Meals: 2-3 meals per day.  he is eating less fried and starchy foods overall and a light meal or snack at lunch . Has dinner at 7-8 PM    Physical activity: exercise: Golf, gardening            Wt Readings from Last 3 Encounters:  11/30/14 170 lb 12.8 oz (77.474 kg)  11/07/14 165 lb (74.844 kg)  10/04/14 168 lb (76.204 kg)   LABS:  Lab Results  Component Value Date   HGBA1C 8.0* 11/22/2014   HGBA1C 8.0* 08/23/2014   HGBA1C 7.7* 05/10/2014   Lab Results  Component Value Date   MICROALBUR 1.3 05/10/2014   LDLCALC 57 08/23/2014   CREATININE 1.2 11/22/2014    2. Hypertension:    His blood pressure has been high for about 30 years.  He was evaluated in 1/08 with urine metanephrines and renal artery ultrasound.  Recent home blood pressure about 120-130/70, checking periodically. BP has been well controlled consistently over the last few years     No visits with results within 1 Week(s) from this visit. Latest known visit with results is:  Appointment on 11/22/2014  Component Date Value Ref Range Status  . Hgb A1c MFr Bld 11/22/2014 8.0* 4.6 - 6.5 % Final   Glycemic Control Guidelines for People with Diabetes:Non Diabetic:  <6%Goal of Therapy: <7%Additional Action Suggested:  >8%   . Sodium  11/22/2014 138  135 - 145 mEq/L Final  . Potassium 11/22/2014 4.2  3.5 - 5.1 mEq/L Final  . Chloride 11/22/2014 106  96 - 112 mEq/L Final  . CO2 11/22/2014 27  19 - 32 mEq/L Final  . Glucose, Bld 11/22/2014 225* 70 - 99 mg/dL Final  . BUN 11/22/2014 18  6 - 23 mg/dL Final  . Creatinine, Ser 11/22/2014 1.2  0.4 - 1.5 mg/dL Final  . Calcium 11/22/2014 9.0  8.4 - 10.5 mg/dL Final  . GFR 11/22/2014 65.32  >60.00 mL/min Final      Medication List       This list is accurate as of: 11/30/14 11:29 AM.  Always use your most recent med list.               amLODipine 10 MG tablet  Commonly known as:  NORVASC  Take 1 tablet (10 mg total) by mouth daily.     aspirin 81 MG EC tablet  Take 81 mg by mouth daily. Swallow whole.     benazepril 20 MG tablet  Commonly known as:  LOTENSIN  TAKE ONE TABLET BY MOUTH ONCE DAILY     carvedilol 25 MG tablet  Commonly known as:  COREG  TAKE ONE TABLET BY MOUTH TWICE DAILY WITH FOOD     doxazosin 4 MG tablet  Commonly known as:  CARDURA  TAKE ONE TABLET BY MOUTH AT BEDTIME     esomeprazole 40 MG capsule  Commonly known as:  NEXIUM  Take 40 mg by mouth daily at 12 noon.     FLORASTOR 250 MG capsule  Generic drug:  saccharomyces boulardii  Take 250 mg by mouth 2 (two) times daily.     gabapentin 300 MG capsule  Commonly known as:  NEURONTIN     glucose blood test strip  Commonly known as:  ONE TOUCH ULTRA TEST  Use as instructed to check blood sugars three times a day dx code E11.65     hydrocortisone 25 MG suppository  Commonly known as:  ANUSOL-HC  Place 1 suppository (25 mg total) rectally 2 (two) times daily.     hyoscyamine 0.125 MG SL tablet  Commonly known as:  LEVSIN SL  Place 1 tablet (0.125 mg total) under the tongue every 4 (four) hours as needed.     Insulin Detemir 100 UNIT/ML Pen  Commonly known as:  LEVEMIR  Inject 8-17 Units into the skin 2 (two) times daily. 9 units in am and 8-10 units in pm     Insulin  Glargine 300  UNIT/ML Sopn  Commonly known as:  TOUJEO SOLOSTAR  Inject 18-19 Units into the skin every morning.     insulin lispro 100 UNIT/ML KiwkPen  Commonly known as:  HUMALOG KWIKPEN  Inject 3-9 units at breakfast 3-4 units at lunch 6-9 units at dinner     metFORMIN 750 MG 24 hr tablet  Commonly known as:  GLUCOPHAGE-XR  Take 750 mg by mouth daily. Take 2 tablets by mouth after dinner     metFORMIN 750 MG 24 hr tablet  Commonly known as:  GLUCOPHAGE-XR  TAKE 2 TABLETS BY MOUTH AFTER DINNER.     pioglitazone 15 MG tablet  Commonly known as:  ACTOS  Take 1 tablet (15 mg total) by mouth daily.     rosuvastatin 5 MG tablet  Commonly known as:  CRESTOR  Take 5 mg by mouth every morning.     vardenafil 20 MG tablet  Commonly known as:  LEVITRA  Take 1 tablet (20 mg total) by mouth daily as needed for erectile dysfunction.     zolpidem 5 MG tablet  Commonly known as:  AMBIEN  Take 1  tablet every night at bedtime for sleep     zolpidem 10 MG tablet  Commonly known as:  AMBIEN  Take 1 tablet at bedtime as needed        Allergies:  Allergies  Allergen Reactions  . Glycopyrrolate Rash    Past Medical History  Diagnosis Date  . Diabetes mellitus without complication   . Hypertension   . Sleep apnea   . Lymphocytosis   . CLL (chronic lymphocytic leukemia)   . GERD (gastroesophageal reflux disease)   . Hyperlipidemia     Past Surgical History  Procedure Laterality Date  . Hemorrhoid surgery      Sclerotherapy  . Blepharoplasty      Family History  Problem Relation Age of Onset  . Kidney disease Mother   . CAD Maternal Aunt     Social History:  reports that he has never smoked. He does not have any smokeless tobacco history on file. He reports that he drinks alcohol. He reports that he does not use illicit drugs.  Review of Systems:  Review of Systems   Review of systems unchanged and the same as of 08/26/14  HYPERLIPIDEMIA: The lipid abnormality  consists of elevated LDL which is very well controlled with low-dose Crestor  Lab Results  Component Value Date   CHOL 123 08/23/2014   HDL 39.50 08/23/2014   LDLCALC 57 08/23/2014   TRIG 134.0 08/23/2014   CHOLHDL 3 08/23/2014    Last colonoscopy was in April 2012    He has mild CLL which is followed is by the hematologist and no treatment currently needed  WBC count about 21,000  Chronic insomnia present  Diagnosed with sleep apnea (11/09) treated with CPAP. Sleeps better and less daytime fatigue.  Diabetic complications: No significant neuropathy, does have erectile dysfunction, no evidence of nephropathy or history of retinopathy    Examination:   BP 135/78 mmHg  Pulse 60  Temp(Src) 98 F (36.7 C)  Resp 14  Ht 5' 7.5" (1.715 m)  Wt 170 lb 12.8 oz (77.474 kg)  BMI 26.34 kg/m2  SpO2 97%  Body mass index is 26.34 kg/(m^2).   Not indicated  ASSESSMENT/ PLAN:   Diabetes type 2, inadequately controlled with persistently high A1c  Problems identified, current management and insulin regimens as well as  glucose patterns are outlined in the history of  present illness Although he has had improved blood sugar control with more consistent fasting readings with using Toujeo he has only used this for the last month and A1c has not come down as yet Most of his hyperglycemia is usually after breakfast probably from inadequate coverage of his morning meal especially if he is eating high-fat foods He does however tend to have low blood sugars before supper time and may do better with taking the Toujeo at night instead of in the morning and reducing the dose by at least 2 units Discussed how to adjust the Toujeo based on fasting blood sugar trend Also discussed needing to adjust at least his breakfast coverage more consistently based on what he is eating and keep postprandial readings from increasing much more than 50 mg Extra snack or juice if planning to be very  active  HYPERTENSION: Well controlled and will continue same treatment, he will also continue to check at home  Counseling time over 50% of today's 25 minute visit  Patient Instructions  Toujeo 18 units at dinner and adjust dose based on am sugar trends  Check more 2 hrs after breakfast, keep it from rising over 50 mg     Tony Marshall 11/30/2014, 11:29 AM

## 2014-12-05 ENCOUNTER — Other Ambulatory Visit: Payer: Self-pay | Admitting: Neurology

## 2014-12-05 DIAGNOSIS — H5711 Ocular pain, right eye: Secondary | ICD-10-CM

## 2014-12-10 ENCOUNTER — Ambulatory Visit
Admission: RE | Admit: 2014-12-10 | Discharge: 2014-12-10 | Disposition: A | Discharge status: Home / Self Care | Payer: Medicare Other | Source: Ambulatory Visit | Attending: Neurology | Admitting: Neurology

## 2014-12-10 DIAGNOSIS — H5711 Ocular pain, right eye: Secondary | ICD-10-CM

## 2014-12-10 MED ORDER — GADOBENATE DIMEGLUMINE 529 MG/ML IV SOLN
16.0000 mL | Freq: Once | INTRAVENOUS | Status: AC | PRN
  Administered 2014-12-10: 16 mL via INTRAVENOUS

## 2014-12-11 ENCOUNTER — Other Ambulatory Visit: Payer: Medicare Other

## 2014-12-27 ENCOUNTER — Other Ambulatory Visit: Payer: Self-pay | Admitting: Endocrinology

## 2015-01-04 ENCOUNTER — Other Ambulatory Visit: Payer: Self-pay | Admitting: Endocrinology

## 2015-01-04 ENCOUNTER — Telehealth: Payer: Self-pay | Admitting: Internal Medicine

## 2015-01-04 NOTE — Telephone Encounter (Signed)
, °

## 2015-01-19 ENCOUNTER — Other Ambulatory Visit: Payer: Self-pay | Admitting: Endocrinology

## 2015-01-19 ENCOUNTER — Other Ambulatory Visit: Payer: Self-pay | Admitting: *Deleted

## 2015-01-19 ENCOUNTER — Telehealth: Payer: Self-pay | Admitting: Endocrinology

## 2015-01-19 MED ORDER — GLUCAGON (RDNA) 1 MG IJ KIT: 1.0000 mg | PACK | Freq: Once | INTRAMUSCULAR | Status: DC | PRN

## 2015-01-19 NOTE — Telephone Encounter (Signed)
Patient would like a Glucacon kit called in to pharmacy   Please send    [facility]    Thank you

## 2015-01-19 NOTE — Telephone Encounter (Signed)
rx sent

## 2015-01-23 ENCOUNTER — Telehealth: Payer: Self-pay | Admitting: Endocrinology

## 2015-01-23 ENCOUNTER — Other Ambulatory Visit: Payer: Self-pay | Admitting: *Deleted

## 2015-01-23 MED ORDER — SILDENAFIL CITRATE 20 MG PO TABS: ORAL_TABLET | ORAL | Status: DC

## 2015-01-23 NOTE — Telephone Encounter (Signed)
Patient called stating that she would like to have a new Rx called in to her pharmacy   The old rx was Leitra 20 Mg   Please fill Sildenafil 20 mg   Pharmacy: [facility] [phone]  Thank you

## 2015-01-23 NOTE — Telephone Encounter (Signed)
Noted, patient is aware. 

## 2015-01-23 NOTE — Telephone Encounter (Signed)
He can switch to sildenafil 20 mg but the equivalent dose to Levitra 20 mg is 5 tablets if he wants to do this

## 2015-01-23 NOTE — Telephone Encounter (Signed)
Okay to switch?  

## 2015-01-26 ENCOUNTER — Ambulatory Visit: Payer: Medicare Other

## 2015-01-26 ENCOUNTER — Other Ambulatory Visit: Payer: Medicare Other

## 2015-02-01 ENCOUNTER — Other Ambulatory Visit: Payer: Self-pay | Admitting: *Deleted

## 2015-02-01 ENCOUNTER — Encounter: Payer: Self-pay | Admitting: Internal Medicine

## 2015-02-01 ENCOUNTER — Ambulatory Visit (HOSPITAL_BASED_OUTPATIENT_CLINIC_OR_DEPARTMENT_OTHER): Payer: Medicare Other | Admitting: Internal Medicine

## 2015-02-01 ENCOUNTER — Other Ambulatory Visit (HOSPITAL_BASED_OUTPATIENT_CLINIC_OR_DEPARTMENT_OTHER): Payer: Medicare Other

## 2015-02-01 ENCOUNTER — Telehealth: Payer: Self-pay | Admitting: Internal Medicine

## 2015-02-01 VITALS — BP 140/72 | HR 56 | Temp 98.4°F | Resp 18 | Ht 67.5 in | Wt 172.6 lb

## 2015-02-01 DIAGNOSIS — I10 Essential (primary) hypertension: Secondary | ICD-10-CM | POA: Diagnosis not present

## 2015-02-01 DIAGNOSIS — E119 Type 2 diabetes mellitus without complications: Secondary | ICD-10-CM | POA: Diagnosis not present

## 2015-02-01 DIAGNOSIS — C9110 Chronic lymphocytic leukemia of B-cell type not having achieved remission: Secondary | ICD-10-CM

## 2015-02-01 LAB — COMPREHENSIVE METABOLIC PANEL (CC13)
ALT: 13 U/L (ref 0–55)
AST: 14 U/L (ref 5–34)
Albumin: 3.5 g/dL (ref 3.5–5.0)
Alkaline Phosphatase: 42 U/L (ref 40–150)
Anion Gap: 8 mEq/L (ref 3–11)
BUN: 17.4 mg/dL (ref 7.0–26.0)
CO2: 25 mEq/L (ref 22–29)
Calcium: 8.7 mg/dL (ref 8.4–10.4)
Chloride: 110 mEq/L — ABNORMAL HIGH (ref 98–109)
Creatinine: 1.1 mg/dL (ref 0.7–1.3)
EGFR: 66 mL/min/{1.73_m2} — ABNORMAL LOW (ref >90)
Glucose: 165 mg/dl — ABNORMAL HIGH (ref 70–140)
Potassium: 4.2 mEq/L (ref 3.5–5.1)
Sodium: 143 mEq/L (ref 136–145)
Total Bilirubin: 0.31 mg/dL (ref 0.20–1.20)
Total Protein: 5.9 g/dL — ABNORMAL LOW (ref 6.4–8.3)

## 2015-02-01 LAB — CBC WITH DIFFERENTIAL/PLATELET
BASO%: 0.2 % (ref 0.0–2.0)
BASOS ABS: 0.1 10*3/uL (ref 0.0–0.1)
EOS%: 1.4 % (ref 0.0–7.0)
Eosinophils Absolute: 0.4 10*3/uL (ref 0.0–0.5)
HCT: 39.1 % (ref 38.4–49.9)
HEMOGLOBIN: 12.3 g/dL — AB (ref 13.0–17.1)
LYMPH#: 23.9 10*3/uL — AB (ref 0.9–3.3)
LYMPH%: 84.8 % — ABNORMAL HIGH (ref 14.0–49.0)
MCH: 29.2 pg (ref 27.2–33.4)
MCHC: 31.6 g/dL — ABNORMAL LOW (ref 32.0–36.0)
MCV: 92.5 fL (ref 79.3–98.0)
MONO#: 0.6 10*3/uL (ref 0.1–0.9)
MONO%: 2.3 % (ref 0.0–14.0)
NEUT#: 3.2 10*3/uL (ref 1.5–6.5)
NEUT%: 11.3 % — ABNORMAL LOW (ref 39.0–75.0)
Platelets: 200 10*3/uL (ref 140–400)
RBC: 4.23 10*6/uL (ref 4.20–5.82)
RDW: 13.7 % (ref 11.0–14.6)
WBC: 28.2 10*3/uL — AB (ref 4.0–10.3)

## 2015-02-01 LAB — TECHNOLOGIST REVIEW

## 2015-02-01 LAB — LACTATE DEHYDROGENASE (CC13): LDH: 197 U/L (ref 125–245)

## 2015-02-01 NOTE — Telephone Encounter (Signed)
Gave avs & calendar for August °

## 2015-02-01 NOTE — Progress Notes (Signed)
[facility] [phone]   Fax:([phone]) [place] [address]  DIAGNOSIS: Chronic lymphocytic leukemia diagnosed in October 2014.  PRIOR THERAPY: None  CURRENT THERAPY: Observation.  INTERVAL HISTORY: Tony Marshall 74 y.o. male returns to the clinic today for follow-up visit accompanied by his wife. The patient is here today to establish care with me after Dr. Juliann Mule left the practice. The patient is feeling fine today with no specific complaints. He denied having any significant weight loss or night sweats. He has no chest pain, shortness of breath, cough or hemoptysis. No significant nausea or vomiting. He had repeat CBC performed earlier today and his here for evaluation and discussion of his lab results.  MEDICAL HISTORY: Past Medical History  Diagnosis Date  . Diabetes mellitus without complication   . Hypertension   . Sleep apnea   . Lymphocytosis   . CLL (chronic lymphocytic leukemia)   . GERD (gastroesophageal reflux disease)   . Hyperlipidemia     ALLERGIES:  is allergic to glycopyrrolate.  MEDICATIONS:  Current Outpatient Prescriptions  Medication Sig Dispense Refill  . amLODipine (NORVASC) 10 MG tablet Take 1 tablet (10 mg total) by mouth daily. 90 tablet 1  . aspirin 81 MG EC tablet Take 81 mg by mouth daily. Swallow whole.    . benazepril (LOTENSIN) 20 MG tablet TAKE ONE TABLET BY MOUTH ONCE DAILY 90 tablet 1  . benazepril (LOTENSIN) 20 MG tablet TAKE ONE TABLET BY MOUTH ONCE DAILY 90 tablet 0  . carvedilol (COREG) 25 MG tablet TAKE ONE TABLET BY MOUTH TWICE DAILY WITH FOOD 180 tablet 1  . doxazosin (CARDURA) 4 MG tablet TAKE ONE TABLET BY MOUTH AT BEDTIME 90 tablet 0  . esomeprazole (NEXIUM) 40 MG capsule Take 40 mg by mouth daily at 12 noon.    . gabapentin (NEURONTIN) 300 MG capsule   0  . glucagon (GLUCAGON EMERGENCY) 1 MG injection Inject 1 mg into the  vein once as needed. 1 each 5  . glucose blood (ONE TOUCH ULTRA TEST) test strip Use as instructed to check blood sugars three times a day dx code E11.65 100 each 5  . hydrocortisone (ANUSOL-HC) 25 MG suppository Place 1 suppository (25 mg total) rectally 2 (two) times daily. 10 suppository 0  . hyoscyamine (LEVSIN SL) 0.125 MG SL tablet Place 1 tablet (0.125 mg total) under the tongue every 4 (four) hours as needed. 30 tablet 1  . Insulin Detemir (LEVEMIR) 100 UNIT/ML Pen Inject 8-17 Units into the skin 2 (two) times daily. 9 units in am and 8-10 units in pm 15 mL 3  . Insulin Glargine (TOUJEO SOLOSTAR) 300 UNIT/ML SOPN Inject 18-19 Units into the skin every morning. 4.5 mL 3  . insulin lispro (HUMALOG KWIKPEN) 100 UNIT/ML KiwkPen Inject 3-9 units at breakfast 3-4 units at lunch 6-9 units at dinner 15 mL 5  . LEVITRA 20 MG tablet TAKE ONE TABLET BY MOUTH AS NEEDED FOR ERECTILE DYSFUNCTION 3 tablet 0  . metFORMIN (GLUCOPHAGE-XR) 750 MG 24 hr tablet Take 750 mg by mouth daily. Take 2 tablets by mouth after dinner    . metFORMIN (GLUCOPHAGE-XR) 750 MG 24 hr tablet TAKE 2 TABLETS BY MOUTH AFTER DINNER. 60 tablet 5  . pioglitazone (ACTOS) 15 MG tablet Take 1 tablet (15 mg total) by mouth daily. 90 tablet 1  . pioglitazone (ACTOS) 30 MG tablet TAKE ONE-HALF (  1/2) TABLET BY MOUTH ONCEDAILY 45 tablet 1  . rosuvastatin (CRESTOR) 5 MG tablet Take 5 mg by mouth every morning.    . saccharomyces boulardii (FLORASTOR) 250 MG capsule Take 250 mg by mouth 2 (two) times daily.    . sildenafil (REVATIO) 20 MG tablet Take 5 tablets as needed 15 tablet 3  . zolpidem (AMBIEN) 10 MG tablet Take 1 tablet at bedtime as needed 30 tablet 2  . zolpidem (AMBIEN) 5 MG tablet Take 1  tablet every night at bedtime for sleep 30 tablet 5   No current facility-administered medications for this visit.    SURGICAL HISTORY:  Past Surgical History  Procedure Laterality Date  . Hemorrhoid surgery      Sclerotherapy  .  Blepharoplasty      REVIEW OF SYSTEMS:  A comprehensive review of systems was negative.   PHYSICAL EXAMINATION: General appearance: alert, cooperative and no distress Head: Normocephalic, without obvious abnormality, atraumatic Neck: no adenopathy, no JVD, supple, symmetrical, trachea midline and thyroid not enlarged, symmetric, no tenderness/mass/nodules Lymph nodes: Cervical, supraclavicular, and axillary nodes normal. Resp: clear to auscultation bilaterally Back: symmetric, no curvature. ROM normal. No CVA tenderness. Cardio: regular rate and rhythm, S1, S2 normal, no murmur, click, rub or gallop GI: soft, non-tender; bowel sounds normal; no masses,  no organomegaly Extremities: extremities normal, atraumatic, no cyanosis or edema  ECOG PERFORMANCE STATUS: 0 - Asymptomatic  Blood pressure 140/72, pulse 56, temperature 98.4 F (36.9 C), temperature source Oral, resp. rate 18, height 5' 7.5" (1.715 m), weight 172 lb 9.6 oz (78.291 kg), SpO2 100 %.  LABORATORY DATA: Lab Results  Component Value Date   WBC 28.2* 02/01/2015   HGB 12.3* 02/01/2015   HCT 39.1 02/01/2015   MCV 92.5 02/01/2015   PLT 200 02/01/2015      Chemistry      Component Value Date/Time   NA 143 02/01/2015 1426   NA 138 11/22/2014 1137   K 4.2 02/01/2015 1426   K 4.2 11/22/2014 1137   CL 106 11/22/2014 1137   CO2 25 02/01/2015 1426   CO2 27 11/22/2014 1137   BUN 17.4 02/01/2015 1426   BUN 18 11/22/2014 1137   CREATININE 1.1 02/01/2015 1426   CREATININE 1.2 11/22/2014 1137      Component Value Date/Time   CALCIUM 8.7 02/01/2015 1426   CALCIUM 9.0 11/22/2014 1137   ALKPHOS 42 02/01/2015 1426   ALKPHOS 41 08/23/2014 1411   AST 14 02/01/2015 1426   AST 16 08/23/2014 1411   ALT 13 02/01/2015 1426   ALT 18 08/23/2014 1411   BILITOT 0.31 02/01/2015 1426   BILITOT 0.3 08/23/2014 1411       RADIOGRAPHIC STUDIES: No results found.  ASSESSMENT AND PLAN: This is a very pleasant 74 years old white  male with history of chronic lymphocytic leukemia currently on observation. His CBC today showed mild increase in the total white blood count. The patient is currently asymptomatic. I recommended for him to continue on observation with repeat CBC, comprehensive metabolic panel and LDH in 6 months. He was advised to call immediately if he has any concerning symptoms in the interval. The patient voices understanding of current disease status and treatment options and is in agreement with the current care plan.  All questions were answered. The patient knows to call the clinic with any problems, questions or concerns. We can certainly see the patient much sooner if necessary.  Disclaimer: This note was dictated with voice recognition software.  Similar sounding words can inadvertently be transcribed and may not be corrected upon review.

## 2015-02-16 ENCOUNTER — Telehealth: Payer: Self-pay | Admitting: Endocrinology

## 2015-02-16 ENCOUNTER — Other Ambulatory Visit: Payer: Self-pay | Admitting: *Deleted

## 2015-02-16 MED ORDER — ZOLPIDEM TARTRATE 10 MG PO TABS: ORAL_TABLET | ORAL | Status: DC

## 2015-02-16 MED ORDER — METFORMIN HCL ER 750 MG PO TB24: 750.0000 mg | ORAL_TABLET | Freq: Every day | ORAL | Status: DC

## 2015-02-16 NOTE — Telephone Encounter (Signed)
Rx sent 

## 2015-02-16 NOTE — Telephone Encounter (Signed)
Wants script called in for 90 day supply they are zoltidem 10mg , metforman hcfer 750mg  2x a day called into [facility] in [place] [phone]

## 2015-02-16 NOTE — Telephone Encounter (Signed)
Levada Dy from [place] stated that she need clarification on patient medication. Please advise, Phone # [phone]

## 2015-02-24 ENCOUNTER — Other Ambulatory Visit: Payer: Medicare Other

## 2015-03-01 ENCOUNTER — Ambulatory Visit: Payer: Medicare Other | Admitting: Endocrinology

## 2015-03-06 ENCOUNTER — Other Ambulatory Visit (INDEPENDENT_AMBULATORY_CARE_PROVIDER_SITE_OTHER): Payer: Medicare Other

## 2015-03-06 DIAGNOSIS — IMO0002 Reserved for concepts with insufficient information to code with codable children: Secondary | ICD-10-CM

## 2015-03-06 DIAGNOSIS — E1165 Type 2 diabetes mellitus with hyperglycemia: Secondary | ICD-10-CM | POA: Diagnosis not present

## 2015-03-06 LAB — URINALYSIS, ROUTINE W REFLEX MICROSCOPIC
BILIRUBIN URINE: NEGATIVE
HGB URINE DIPSTICK: NEGATIVE
Ketones, ur: NEGATIVE
Leukocytes, UA: NEGATIVE
NITRITE: NEGATIVE
RBC / HPF: NONE SEEN (ref 0–?)
SPECIFIC GRAVITY, URINE: 1.015 (ref 1.000–1.030)
Total Protein, Urine: NEGATIVE
URINE GLUCOSE: 250 — AB
Urobilinogen, UA: 0.2 (ref 0.0–1.0)
pH: 6 (ref 5.0–8.0)

## 2015-03-06 LAB — MICROALBUMIN / CREATININE URINE RATIO
CREATININE, U: 99.1 mg/dL
MICROALB/CREAT RATIO: 1.6 mg/g (ref 0.0–30.0)
Microalb, Ur: 1.6 mg/dL (ref 0.0–1.9)

## 2015-03-06 LAB — BASIC METABOLIC PANEL
BUN: 14 mg/dL (ref 6–23)
CO2: 31 mEq/L (ref 19–32)
Calcium: 9.1 mg/dL (ref 8.4–10.5)
Chloride: 105 mEq/L (ref 96–112)
Creatinine, Ser: 1.17 mg/dL (ref 0.40–1.50)
GFR: 65.26 mL/min (ref >60.00)
GLUCOSE: 164 mg/dL — AB (ref 70–99)
Potassium: 4 mEq/L (ref 3.5–5.1)
Sodium: 140 mEq/L (ref 135–145)

## 2015-03-06 LAB — HEMOGLOBIN A1C: Hgb A1c MFr Bld: 8.1 % — ABNORMAL HIGH (ref 4.6–6.5)

## 2015-03-09 ENCOUNTER — Encounter: Payer: Self-pay | Admitting: Endocrinology

## 2015-03-09 ENCOUNTER — Ambulatory Visit (INDEPENDENT_AMBULATORY_CARE_PROVIDER_SITE_OTHER): Payer: Medicare Other | Admitting: Endocrinology

## 2015-03-09 ENCOUNTER — Other Ambulatory Visit: Payer: Self-pay | Admitting: *Deleted

## 2015-03-09 VITALS — BP 160/78 | HR 62 | Temp 97.8°F | Resp 16 | Ht 67.5 in | Wt 170.6 lb

## 2015-03-09 DIAGNOSIS — IMO0002 Reserved for concepts with insufficient information to code with codable children: Secondary | ICD-10-CM

## 2015-03-09 DIAGNOSIS — I10 Essential (primary) hypertension: Secondary | ICD-10-CM

## 2015-03-09 DIAGNOSIS — E1165 Type 2 diabetes mellitus with hyperglycemia: Secondary | ICD-10-CM

## 2015-03-09 DIAGNOSIS — E7800 Pure hypercholesterolemia, unspecified: Secondary | ICD-10-CM

## 2015-03-09 MED ORDER — CARVEDILOL 25 MG PO TABS: 25.0000 mg | ORAL_TABLET | Freq: Two times a day (BID) | ORAL | Status: DC

## 2015-03-09 MED ORDER — METHOCARBAMOL 500 MG PO TABS: 500.0000 mg | ORAL_TABLET | Freq: Three times a day (TID) | ORAL | Status: DC | PRN

## 2015-03-09 MED ORDER — BENAZEPRIL HCL 20 MG PO TABS: ORAL_TABLET | ORAL | Status: DC

## 2015-03-09 MED ORDER — DOXAZOSIN MESYLATE 4 MG PO TABS: 4.0000 mg | ORAL_TABLET | Freq: Every day | ORAL | Status: DC

## 2015-03-09 MED ORDER — PIOGLITAZONE HCL 15 MG PO TABS: 15.0000 mg | ORAL_TABLET | Freq: Every day | ORAL | Status: DC

## 2015-03-09 MED ORDER — ZOLPIDEM TARTRATE 10 MG PO TABS: ORAL_TABLET | ORAL | Status: DC

## 2015-03-09 NOTE — Progress Notes (Signed)
Patient ID: Tony Marshall, male   DOB: 1941-04-12, 74 y.o.   MRN: [id number]   Reason for Appointment:    History of Present Illness   1. Type 2 DIABETES MELITUS, date of diagnosis:  1992        His blood sugar has been  difficult to control over the last few years because of variability in his blood sugars On basal bolus insulin for a few years and his oral hypoglycemic drugs have been continued Also did not tolerate GLP-1 drugs or symlin because of nausea He is essentially looking like a a type I patient with requiring relatively low amounts of insulin and twice a day dosage of basal insulin He has done somewhat better with Levemir compared to Lantus and is using this twice a day also  Recent history:  He appears to have overall improved blood sugar control especially fasting with using Toujeo instead of Levemir twice a day; generally appears to have less fluctuation in his blood sugars and less hypoglycemia However his A1c is still not below 8% Since his last visit he has changed his Toujeo to the evening since previously blood sugars were relatively low at suppertime Recent blood sugar patterns and problems:  His blood sugars are mostly near normal in the mornings with sporadic relatively higher readings recently but today was as low as 75.  He thinks that sometimes he will feel hypoglycemic early morning around 4-5 AM but has not reduced his Toujeo since he started having symptoms  He has not checked readings after meals as directed and is checking them mostly in the mornings or midday.  He has had a few episodes of hypoglycemia in the afternoons when he is being more active and did not reduce his lunchtime coverage or have snacks for the increased activity  No readings after supper  Still adjusting his mealtime insulin empirically on what he is eating without carbohydrate counting   Hypoglycemia:  has 2 low readings in the 50s after lunch and occasionally at 4-5 am Mealtime  insulin: He is taking variable doses at breakfast based on what his blood sugar is but not targeting his postprandial readings Still taking relatively lower doses of Humalog as meals are relatively lower in fat  Oral hypoglycemic drugs: Actos, Metformin        Side effects from medications: None Insulin regimen: Toujeo insulin 18 units hs; mealtime insulin: Mealtime insulin: Breakfast = 2-4 units, lunch 3-4  supper 5-7 units based on meal size         Proper timing of medications in relation to meals: Yes.          Monitors blood glucose: QD    Glucometer: One Touch.          Blood Glucose readings from one touch zoom software download:   PRE-MEAL Breakfast Lunch  3-6 PM  Bedtime Overall  Glucose range:  75-205   95-154   51-100     Average:  125      108    Meals: 2-3 meals per day.  he is eating less fried and starchy foods overall and a light meal or snack at lunch . Has dinner at 7-8 PM    Physical activity: exercise:  Exercise bike, Golf, gardening            Wt Readings from Last 3 Encounters:  03/09/15 170 lb 9.6 oz (77.384 kg)  02/01/15 172 lb 9.6 oz (78.291 kg)  11/30/14 170 lb 12.8 oz (  77.474 kg)   LABS:  Lab Results  Component Value Date   HGBA1C 8.1* 03/06/2015   HGBA1C 8.0* 11/22/2014   HGBA1C 8.0* 08/23/2014   Lab Results  Component Value Date   MICROALBUR 1.6 03/06/2015   LDLCALC 57 08/23/2014   CREATININE 1.17 03/06/2015    2. Hypertension:    His blood pressure has been high for about 30 years.  He was evaluated in 1/08 with urine metanephrines and renal artery ultrasound because of marked increase in blood pressure BP has been well controlled consistently over the last few years    Recent home blood pressure about 120-130/70, checking periodically but not recently.   Not clear why his blood pressure is higher today, he thinks this is from not sleeping last night     Lab on 03/06/2015  Component Date Value Ref Range Status  . Hgb A1c MFr Bld  03/06/2015 8.1* 4.6 - 6.5 % Final   Glycemic Control Guidelines for People with Diabetes:Non Diabetic:  <6%Goal of Therapy: <7%Additional Action Suggested:  >8%   . Sodium 03/06/2015 140  135 - 145 mEq/L Final  . Potassium 03/06/2015 4.0  3.5 - 5.1 mEq/L Final  . Chloride 03/06/2015 105  96 - 112 mEq/L Final  . CO2 03/06/2015 31  19 - 32 mEq/L Final  . Glucose, Bld 03/06/2015 164* 70 - 99 mg/dL Final  . BUN 03/06/2015 14  6 - 23 mg/dL Final  . Creatinine, Ser 03/06/2015 1.17  0.40 - 1.50 mg/dL Final  . Calcium 03/06/2015 9.1  8.4 - 10.5 mg/dL Final  . GFR 03/06/2015 65.26  >60.00 mL/min Final  . Microalb, Ur 03/06/2015 1.6  0.0 - 1.9 mg/dL Final  . Creatinine,U 03/06/2015 99.1   Final  . Microalb Creat Ratio 03/06/2015 1.6  0.0 - 30.0 mg/g Final  . Color, Urine 03/06/2015 YELLOW  Yellow;Lt. Yellow Final  . APPearance 03/06/2015 CLEAR  Clear Final  . Specific Gravity, Urine 03/06/2015 1.015  1.000-1.030 Final  . pH 03/06/2015 6.0  5.0 - 8.0 Final  . Total Protein, Urine 03/06/2015 NEGATIVE  Negative Final  . Urine Glucose 03/06/2015 250* Negative Final  . Ketones, ur 03/06/2015 NEGATIVE  Negative Final  . Bilirubin Urine 03/06/2015 NEGATIVE  Negative Final  . Hgb urine dipstick 03/06/2015 NEGATIVE  Negative Final  . Urobilinogen, UA 03/06/2015 0.2  0.0 - 1.0 Final  . Leukocytes, UA 03/06/2015 NEGATIVE  Negative Final  . Nitrite 03/06/2015 NEGATIVE  Negative Final  . RBC / HPF 03/06/2015 none seen  0-2/hpf Final  . Renal Epithel, UA 03/06/2015 Rare(0-4/hpf)* None Final      Medication List       This list is accurate as of: 03/09/15 11:59 PM.  Always use your most recent med list.               amLODipine 10 MG tablet  Commonly known as:  NORVASC  Take 1 tablet (10 mg total) by mouth daily.     aspirin 81 MG EC tablet  Take 81 mg by mouth daily. Swallow whole.     benazepril 20 MG tablet  Commonly known as:  LOTENSIN  TAKE ONE TABLET BY MOUTH ONCE DAILY     carvedilol  25 MG tablet  Commonly known as:  COREG  Take 1 tablet (25 mg total) by mouth 2 (two) times daily with a meal.     doxazosin 4 MG tablet  Commonly known as:  CARDURA  Take 1 tablet (4 mg total)  by mouth at bedtime.     esomeprazole 40 MG capsule  Commonly known as:  NEXIUM  Take 40 mg by mouth daily at 12 noon.     FLORASTOR 250 MG capsule  Generic drug:  saccharomyces boulardii  Take 250 mg by mouth 2 (two) times daily.     gabapentin 300 MG capsule  Commonly known as:  NEURONTIN     glucagon 1 MG injection  Commonly known as:  GLUCAGON EMERGENCY  Inject 1 mg into the vein once as needed.     glucose blood test strip  Commonly known as:  ONE TOUCH ULTRA TEST  Use as instructed to check blood sugars three times a day dx code E11.65     hydrocortisone 25 MG suppository  Commonly known as:  ANUSOL-HC  Place 1 suppository (25 mg total) rectally 2 (two) times daily.     hyoscyamine 0.125 MG SL tablet  Commonly known as:  LEVSIN SL  Place 1 tablet (0.125 mg total) under the tongue every 4 (four) hours as needed.     Insulin Detemir 100 UNIT/ML Pen  Commonly known as:  LEVEMIR  Inject 8-17 Units into the skin 2 (two) times daily. 9 units in am and 8-10 units in pm     Insulin Glargine 300 UNIT/ML Sopn  Commonly known as:  TOUJEO SOLOSTAR  Inject 18-19 Units into the skin every morning.     insulin lispro 100 UNIT/ML KiwkPen  Commonly known as:  HUMALOG KWIKPEN  Inject 3-9 units at breakfast 3-4 units at lunch 6-9 units at dinner     LEVITRA 20 MG tablet  Generic drug:  vardenafil  TAKE ONE TABLET BY MOUTH AS NEEDED FOR ERECTILE DYSFUNCTION     metFORMIN 750 MG 24 hr tablet  Commonly known as:  GLUCOPHAGE-XR  TAKE 2 TABLETS BY MOUTH AFTER DINNER.     metFORMIN 750 MG 24 hr tablet  Commonly known as:  GLUCOPHAGE-XR  Take 1 tablet (750 mg total) by mouth daily. Take 2 tablets by mouth after dinner     methocarbamol 500 MG tablet  Commonly known as:  ROBAXIN  Take 1  tablet (500 mg total) by mouth every 8 (eight) hours as needed for muscle spasms.     pioglitazone 30 MG tablet  Commonly known as:  ACTOS  TAKE ONE-HALF (1/2) TABLET BY MOUTH ONCEDAILY     pioglitazone 15 MG tablet  Commonly known as:  ACTOS  Take 1 tablet (15 mg total) by mouth daily.     rosuvastatin 5 MG tablet  Commonly known as:  CRESTOR  Take 5 mg by mouth every morning.     sildenafil 20 MG tablet  Commonly known as:  REVATIO  Take 5 tablets as needed     zolpidem 10 MG tablet  Commonly known as:  AMBIEN  Take 1 tablet at bedtime as needed        Allergies:  Allergies  Allergen Reactions  . Glycopyrrolate Rash    Past Medical History  Diagnosis Date  . Diabetes mellitus without complication   . Hypertension   . Sleep apnea   . Lymphocytosis   . CLL (chronic lymphocytic leukemia)   . GERD (gastroesophageal reflux disease)   . Hyperlipidemia     Past Surgical History  Procedure Laterality Date  . Hemorrhoid surgery      Sclerotherapy  . Blepharoplasty      Family History  Problem Relation Age of Onset  . Kidney disease Mother   .  CAD Maternal Aunt     Social History:  reports that he has never smoked. He does not have any smokeless tobacco history on file. He reports that he drinks alcohol. He reports that he does not use illicit drugs.  Review of Systems:  ROS   HYPERLIPIDEMIA: The lipid abnormality consists of elevated LDL which is very well controlled with low-dose Crestor  Lab Results  Component Value Date   CHOL 123 08/23/2014   HDL 39.50 08/23/2014   LDLCALC 57 08/23/2014   TRIG 134.0 08/23/2014   CHOLHDL 3 08/23/2014       He has mild CLL which is followed is by the hematologist and no treatment currently needed  WBC count still persistently high  Lab Results  Component Value Date   WBC 28.2* 02/01/2015   HGB 12.3* 02/01/2015   HCT 39.1 02/01/2015   MCV 92.5 02/01/2015   PLT 200 02/01/2015     Chronic insomnia present  and did not sleep well last night, taking Ambien fairly regularly  Diagnosed with sleep apnea (11/09) treated with CPAP.   Diabetic complications: No significant neuropathy, does have erectile dysfunction, no evidence of nephropathy or history of retinopathy    Examination:   BP 160/78 mmHg  Pulse 62  Temp(Src) 97.8 F (36.6 C)  Resp 16  Ht 5' 7.5" (1.715 m)  Wt 170 lb 9.6 oz (77.384 kg)  BMI 26.31 kg/m2  SpO2 98%  Body mass index is 26.31 kg/(m^2).   Repeat blood pressure unchanged  No pedal edema    ASSESSMENT/ PLAN:   Diabetes type 2, inadequately controlled with persistently high A1c  Problems identified, current management and insulin regimens as well as  glucose patterns are outlined in the history of present illness Although he has had  less fluctuation in his blood sugar control with using Toujeo as his basal insulin his A1c has not improved Most likely has high postprandial readings at times according to the higher A1c He is also not checking his blood sugars enough especially after meals Currently appears to have mostly good readings in the mornings but slightly higher readings late morning and lower readings early morning indicating some dawn phenomenon Also appears to be periodically hypoglycemic with increase activity for which he does not make adjustments with insulin or snacks  Discussed day-to-day management with him with adjusting insulin doses, both basal and bolus and glucose monitoring as well as balanced meals; he needs to monitor more consistently after meals and discussed blood sugar targets For now reduce his Toujeo gradually to avoid overnight hypoglycemia  HYPERTENSION: Although this is usually well controlled his blood pressure is unusually high today and he has not monitored readings at home for a couple of weeks Discussed that he can increase his Norvasc if blood pressure is higher at home also, currently taking 5 mg  May continue Ambien for  insomnia  Counseling time over 50% of today's 25 minute visit  Patient Instructions  More sugars after supper, keep them <160  17 toujeo  If BP is over 140, take full amlodipine     Neda Willenbring 03/10/2015, 5:54 PM

## 2015-03-09 NOTE — Patient Instructions (Addendum)
More sugars after supper, keep them <160  17 toujeo  If BP is over 140, take full amlodipine

## 2015-03-27 DIAGNOSIS — G4733 Obstructive sleep apnea (adult) (pediatric): Secondary | ICD-10-CM | POA: Diagnosis not present

## 2015-04-27 ENCOUNTER — Other Ambulatory Visit: Payer: Self-pay | Admitting: *Deleted

## 2015-04-27 ENCOUNTER — Telehealth: Payer: Self-pay | Admitting: Endocrinology

## 2015-04-27 MED ORDER — METFORMIN HCL ER 750 MG PO TB24: ORAL_TABLET | ORAL | Status: DC

## 2015-04-27 NOTE — Telephone Encounter (Signed)
rx sent

## 2015-04-27 NOTE — Telephone Encounter (Signed)
Pt needs refills on metformin hcler 750mg  , he wants a 90 day supply use [facility] to fill it number is [phone]

## 2015-05-08 ENCOUNTER — Other Ambulatory Visit: Payer: Self-pay | Admitting: Endocrinology

## 2015-06-06 ENCOUNTER — Other Ambulatory Visit: Payer: Medicare Other

## 2015-06-09 ENCOUNTER — Ambulatory Visit: Payer: Medicare Other | Admitting: Endocrinology

## 2015-06-14 DIAGNOSIS — H2513 Age-related nuclear cataract, bilateral: Secondary | ICD-10-CM | POA: Diagnosis not present

## 2015-06-14 LAB — HM DIABETES EYE EXAM

## 2015-07-10 ENCOUNTER — Other Ambulatory Visit (INDEPENDENT_AMBULATORY_CARE_PROVIDER_SITE_OTHER): Payer: Medicare Other

## 2015-07-10 DIAGNOSIS — E1165 Type 2 diabetes mellitus with hyperglycemia: Secondary | ICD-10-CM

## 2015-07-10 DIAGNOSIS — IMO0002 Reserved for concepts with insufficient information to code with codable children: Secondary | ICD-10-CM

## 2015-07-10 DIAGNOSIS — E7800 Pure hypercholesterolemia, unspecified: Secondary | ICD-10-CM

## 2015-07-10 LAB — LIPID PANEL
Cholesterol: 130 mg/dL (ref 0–200)
HDL: 46.7 mg/dL (ref >39.00)
LDL CALC: 73 mg/dL (ref 0–99)
NONHDL: 83.3
Total CHOL/HDL Ratio: 3
Triglycerides: 54 mg/dL (ref 0.0–149.0)
VLDL: 10.8 mg/dL (ref 0.0–40.0)

## 2015-07-10 LAB — COMPREHENSIVE METABOLIC PANEL
ALT: 10 U/L (ref 0–53)
AST: 13 U/L (ref 0–37)
Albumin: 3.8 g/dL (ref 3.5–5.2)
Alkaline Phosphatase: 34 U/L — ABNORMAL LOW (ref 39–117)
BUN: 16 mg/dL (ref 6–23)
CHLORIDE: 104 meq/L (ref 96–112)
CO2: 31 mEq/L (ref 19–32)
Calcium: 9.3 mg/dL (ref 8.4–10.5)
Creatinine, Ser: 1.09 mg/dL (ref 0.40–1.50)
GFR: 70.75 mL/min (ref >60.00)
GLUCOSE: 118 mg/dL — AB (ref 70–99)
POTASSIUM: 4 meq/L (ref 3.5–5.1)
SODIUM: 140 meq/L (ref 135–145)
Total Bilirubin: 0.5 mg/dL (ref 0.2–1.2)
Total Protein: 5.9 g/dL — ABNORMAL LOW (ref 6.0–8.3)

## 2015-07-10 LAB — HEMOGLOBIN A1C: HEMOGLOBIN A1C: 8.3 % — AB (ref 4.6–6.5)

## 2015-07-13 ENCOUNTER — Encounter: Payer: Self-pay | Admitting: Endocrinology

## 2015-07-13 ENCOUNTER — Ambulatory Visit (INDEPENDENT_AMBULATORY_CARE_PROVIDER_SITE_OTHER): Payer: Medicare Other | Admitting: Endocrinology

## 2015-07-13 VITALS — BP 134/82 | HR 58 | Temp 98.4°F | Resp 16 | Ht 67.5 in | Wt 164.8 lb

## 2015-07-13 DIAGNOSIS — E1165 Type 2 diabetes mellitus with hyperglycemia: Secondary | ICD-10-CM | POA: Diagnosis not present

## 2015-07-13 DIAGNOSIS — I10 Essential (primary) hypertension: Secondary | ICD-10-CM

## 2015-07-13 DIAGNOSIS — E7800 Pure hypercholesterolemia, unspecified: Secondary | ICD-10-CM

## 2015-07-13 DIAGNOSIS — IMO0002 Reserved for concepts with insufficient information to code with codable children: Secondary | ICD-10-CM

## 2015-07-13 DIAGNOSIS — D72820 Lymphocytosis (symptomatic): Secondary | ICD-10-CM

## 2015-07-13 NOTE — Patient Instructions (Addendum)
Take 14 Toujeo in pms but no changes unless am sugar out of range  Take 9-14 Humalog units at supper, about 2 more than current doses  Check blood sugars on waking up .Marland Kitchen3-4  .Marland Kitchen times a week Also check blood sugars about 2 hours after a meal and do this after different meals by rotation Recommended blood sugar levels on waking up is 90-130 and about 2 hours after meal is 140-180 Please bring blood sugar monitor to each visit.

## 2015-07-13 NOTE — Progress Notes (Signed)
Patient ID: Tony Marshall, male   DOB: 1941/11/16, 74 y.o.   MRN: [id number]   Reason for Appointment:    History of Present Illness   1. Type 2 DIABETES MELITUS, date of diagnosis:  1992        His blood sugar has been  difficult to control over the last few years because of variability in his blood sugars On basal bolus insulin for a few years and his oral hypoglycemic drugs have been continued Also did not tolerate GLP-1 drugs or symlin because of nausea He is essentially looking like a a type I patient with requiring relatively low amounts of insulin and twice a day dosage of basal insulin He has done somewhat better with Levemir compared to Lantus and is using this twice a day also  Recent history:   Insulin regimen: Toujeo insulin 14-18 units 6-8 pm.  Mealtime insulin: Breakfast = 2-4 units, lunch 3-4  supper 9-11 units based on meal size          He still has an A1c over 8% and blood sugars are not improving Although his fasting blood sugars were better with Toujeo instead of Levemir twice a day they are not consistent recently Since his last visit he has changed his Toujeo to suppertime instead of bedtime Recent blood sugar patterns and problems:  His blood sugars are somewhat variable in the mornings but not consistently high or low  He now says that he is adjusting his TOUJEO insulin at suppertime based on how much he is eating  With taking higher doses of Toujeo he is probably getting hypoglycemia in the mornings  Although he has increased his suppertime Humalog coverage he still has mostly high readings after the meal despite taking as much as 11 units  Recently has only a few readings after breakfast and lunch which are not consistently abnormal   He will take 2-3 units of Humalog at bedtime if blood sugars are significantly high   Hypoglycemia:  has 3 episodes of morning hypoglycemia between 7-9 AM    Oral hypoglycemic drugs: Actos, Metformin        Side effects  from medications: None Proper timing of medications in relation to meals: Yes.          Monitors blood glucose: QD    Glucometer: One Touch.          Blood Glucose readings from one touch zoom software download:   Mean values apply above for all meters except median for One Touch  PRE-MEAL Fasting Lunch Dinner Bedtime Overall  Glucose range:  49-195   66-172   136, 188, 267   166-294   49-294   Mean/median:     215   166     Meals: 2-3 meals per day.  he is eating less fried and starchy foods overall and a light meal or snack at lunch . Has dinner at 7-8 PM    Physical activity: exercise:  Exercise bike, Golf, gardening            Wt Readings from Last 3 Encounters:  07/13/15 164 lb 12.8 oz (74.753 kg)  03/09/15 170 lb 9.6 oz (77.384 kg)  02/01/15 172 lb 9.6 oz (78.291 kg)   LABS:  Lab Results  Component Value Date   HGBA1C 8.3* 07/10/2015   HGBA1C 8.1* 03/06/2015   HGBA1C 8.0* 11/22/2014   Lab Results  Component Value Date   MICROALBUR 1.6 03/06/2015   [place] 73 07/10/2015  CREATININE 1.09 07/10/2015     2. Hypertension:    His blood pressure has been high for about 30 years.  He was evaluated in 1/08 with urine metanephrines and renal artery ultrasound because of marked increase in blood pressure BP has been well controlled consistently over the last few years    Recent home blood pressure about 120-130/70, checking fairly regularly He thinks his blood pressure was higher about 150 and he has increased his Cardura to the full tablet  Occasionally if he gets up to quickly he will get lightheaded      Lab on 07/10/2015  Component Date Value Ref Range Status  . Hgb A1c MFr Bld 07/10/2015 8.3* 4.6 - 6.5 % Final   Glycemic Control Guidelines for People with Diabetes:Non Diabetic:  <6%Goal of Therapy: <7%Additional Action Suggested:  >8%   . Sodium 07/10/2015 140  135 - 145 mEq/L Final  . Potassium 07/10/2015 4.0  3.5 - 5.1 mEq/L Final  . Chloride 07/10/2015 104   96 - 112 mEq/L Final  . CO2 07/10/2015 31  19 - 32 mEq/L Final  . Glucose, Bld 07/10/2015 118* 70 - 99 mg/dL Final  . BUN 07/10/2015 16  6 - 23 mg/dL Final  . Creatinine, Ser 07/10/2015 1.09  0.40 - 1.50 mg/dL Final  . Total Bilirubin 07/10/2015 0.5  0.2 - 1.2 mg/dL Final  . Alkaline Phosphatase 07/10/2015 34* 39 - 117 U/L Final  . AST 07/10/2015 13  0 - 37 U/L Final  . ALT 07/10/2015 10  0 - 53 U/L Final  . Total Protein 07/10/2015 5.9* 6.0 - 8.3 g/dL Final  . Albumin 07/10/2015 3.8  3.5 - 5.2 g/dL Final  . Calcium 07/10/2015 9.3  8.4 - 10.5 mg/dL Final  . GFR 07/10/2015 70.75  >60.00 mL/min Final  . Cholesterol 07/10/2015 130  0 - 200 mg/dL Final   ATP III Classification       Desirable:  < 200 mg/dL               Borderline High:  200 - 239 mg/dL          High:  > = 240 mg/dL  . Triglycerides 07/10/2015 54.0  0.0 - 149.0 mg/dL Final   Normal:  <150 mg/dLBorderline High:  150 - 199 mg/dL  . HDL 07/10/2015 46.70  >39.00 mg/dL Final  . VLDL 07/10/2015 10.8  0.0 - 40.0 mg/dL Final  . LDL Cholesterol 07/10/2015 73  0 - 99 mg/dL Final  . Total CHOL/HDL Ratio 07/10/2015 3   Final                  Men          Women1/2 Average Risk     3.4          3.3Average Risk          5.0          4.42X Average Risk          9.6          7.13X Average Risk          15.0          11.0                      . NonHDL 07/10/2015 83.30   Final   NOTE:  Non-HDL goal should be 30 mg/dL higher than patient's LDL goal (i.e. LDL goal of < 70 mg/dL, would have  non-HDL goal of < 100 mg/dL)      Medication List       This list is accurate as of: 07/13/15  2:04 PM.  Always use your most recent med list.               amLODipine 10 MG tablet  Commonly known as:  NORVASC  Take 1 tablet (10 mg total) by mouth daily.     aspirin 81 MG EC tablet  Take 81 mg by mouth daily. Swallow whole.     benazepril 20 MG tablet  Commonly known as:  LOTENSIN  TAKE ONE TABLET BY MOUTH ONCE DAILY     carvedilol 25 MG  tablet  Commonly known as:  COREG  Take 1 tablet (25 mg total) by mouth 2 (two) times daily with a meal.     doxazosin 4 MG tablet  Commonly known as:  CARDURA  Take 1 tablet (4 mg total) by mouth at bedtime.     esomeprazole 40 MG capsule  Commonly known as:  NEXIUM  Take 40 mg by mouth daily at 12 noon.     FLORASTOR 250 MG capsule  Generic drug:  saccharomyces boulardii  Take 250 mg by mouth 2 (two) times daily.     gabapentin 300 MG capsule  Commonly known as:  NEURONTIN     glucagon 1 MG injection  Commonly known as:  GLUCAGON EMERGENCY  Inject 1 mg into the vein once as needed.     glucose blood test strip  Commonly known as:  ONE TOUCH ULTRA TEST  Use as instructed to check blood sugars three times a day dx code E11.65     HUMALOG KWIKPEN 100 UNIT/ML KiwkPen  Generic drug:  insulin lispro  INJECT 3-9 UNITS AT BREAKFAST 3-4 UNITS AT LUNCH 6-9 UNITS AT DINNER     hydrocortisone 25 MG suppository  Commonly known as:  ANUSOL-HC  Place 1 suppository (25 mg total) rectally 2 (two) times daily.     hyoscyamine 0.125 MG SL tablet  Commonly known as:  LEVSIN SL  Place 1 tablet (0.125 mg total) under the tongue every 4 (four) hours as needed.     Insulin Glargine 300 UNIT/ML Sopn  Commonly known as:  TOUJEO SOLOSTAR  Inject 18-19 Units into the skin every morning.     LEVITRA 20 MG tablet  Generic drug:  vardenafil  TAKE ONE TABLET BY MOUTH AS NEEDED FOR ERECTILE DYSFUNCTION     metFORMIN 750 MG 24 hr tablet  Commonly known as:  GLUCOPHAGE-XR  TAKE 2 TABLETS BY MOUTH AFTER DINNER.     metFORMIN 750 MG 24 hr tablet  Commonly known as:  GLUCOPHAGE-XR  Take 2 tablets by mouth after dinner     methocarbamol 500 MG tablet  Commonly known as:  ROBAXIN  Take 1 tablet (500 mg total) by mouth every 8 (eight) hours as needed for muscle spasms.     pioglitazone 30 MG tablet  Commonly known as:  ACTOS  TAKE ONE-HALF (1/2) TABLET BY MOUTH ONCEDAILY     pioglitazone 15  MG tablet  Commonly known as:  ACTOS  Take 1 tablet (15 mg total) by mouth daily.     rosuvastatin 5 MG tablet  Commonly known as:  CRESTOR  Take 5 mg by mouth every morning.     sildenafil 20 MG tablet  Commonly known as:  REVATIO  Take 5 tablets as needed     zolpidem 10 MG tablet  Commonly known as:  AMBIEN  Take 1 tablet at bedtime as needed        Allergies:  Allergies  Allergen Reactions  . Glycopyrrolate Rash    Past Medical History  Diagnosis Date  . Diabetes mellitus without complication   . Hypertension   . Sleep apnea   . Lymphocytosis   . CLL (chronic lymphocytic leukemia)   . GERD (gastroesophageal reflux disease)   . Hyperlipidemia     Past Surgical History  Procedure Laterality Date  . Hemorrhoid surgery      Sclerotherapy  . Blepharoplasty      Family History  Problem Relation Age of Onset  . Kidney disease Mother   . CAD Maternal Aunt     Social History:  reports that he has never smoked. He does not have any smokeless tobacco history on file. He reports that he drinks alcohol. He reports that he does not use illicit drugs.  Review of Systems:  ROS   HYPERLIPIDEMIA: The lipid abnormality consists of elevated LDL which is very well controlled with low-dose Crestor  Lab Results  Component Value Date   CHOL 130 07/10/2015   HDL 46.70 07/10/2015   LDLCALC 73 07/10/2015   TRIG 54.0 07/10/2015   CHOLHDL 3 07/10/2015      He has mild CLL which is followed is by the hematologist and no treatment currently needed  WBC count still persistently high  Lab Results  Component Value Date   WBC 28.2* 02/01/2015   HGB 12.3* 02/01/2015   HCT 39.1 02/01/2015   MCV 92.5 02/01/2015   PLT 200 02/01/2015    Chronic insomnia present and taking Ambien fairly regularly  Diagnosed with sleep apnea (11/09) treated with CPAP.   Diabetic complications: No significant neuropathy, does have erectile dysfunction, no evidence of nephropathy or history  of retinopathy    Examination:   BP 134/82 mmHg  Pulse 58  Temp(Src) 98.4 F (36.9 C)  Resp 16  Ht 5' 7.5" (1.715 m)  Wt 164 lb 12.8 oz (74.753 kg)  BMI 25.42 kg/m2  SpO2 97%  Body mass index is 25.42 kg/(m^2).    repeat pulse 72, heart rate normal  Standing blood pressure 132/76 Diabetic foot exam shows normal monofilament sensation in the toes and plantar surfaces, no skin lesions or ulcers on the feet and normal pedal pulses   No pedal edema    ASSESSMENT/ PLAN:   Diabetes type 2, inadequately controlled with persistently high A1c See history of present illness for detailed discussion of his current management, blood sugar patterns and problems identified  As discussed above he is inappropriately adjusting his evening basal insulin based on his carbohydrate intake This is causing periodically overnight hypoglycemia Also not taking enough insulin for his evening meal and most of his readings after supper and around bedtime are high.  Again discussed actions of both nasal and bolus insulin and how to adjust the Given him flowsheet to adjust Toujeo every 3 days based on fasting blood sugar patterns He will need more insulin at suppertime, generally 2 units more than current regimen Also needs more blood sugars after breakfast and lunch He will continue metformin and Actos   HYPERTENSION: well controlled without orthostasis today He will continue the same regimen   Counseling time on subjects discussed above is over 50% of today's 25 minute visit  There are no Patient Instructions on file for this visit.   Trudy Kory 07/13/2015, 2:04 PM

## 2015-07-14 ENCOUNTER — Encounter: Payer: Self-pay | Admitting: *Deleted

## 2015-07-24 DIAGNOSIS — H2511 Age-related nuclear cataract, right eye: Secondary | ICD-10-CM | POA: Diagnosis not present

## 2015-07-31 DIAGNOSIS — H25811 Combined forms of age-related cataract, right eye: Secondary | ICD-10-CM | POA: Diagnosis not present

## 2015-07-31 DIAGNOSIS — H2511 Age-related nuclear cataract, right eye: Secondary | ICD-10-CM | POA: Diagnosis not present

## 2015-08-02 ENCOUNTER — Encounter: Payer: Self-pay | Admitting: Internal Medicine

## 2015-08-02 ENCOUNTER — Other Ambulatory Visit (HOSPITAL_BASED_OUTPATIENT_CLINIC_OR_DEPARTMENT_OTHER): Payer: Medicare Other

## 2015-08-02 ENCOUNTER — Telehealth: Payer: Self-pay | Admitting: Internal Medicine

## 2015-08-02 ENCOUNTER — Ambulatory Visit (HOSPITAL_BASED_OUTPATIENT_CLINIC_OR_DEPARTMENT_OTHER): Payer: Medicare Other | Admitting: Internal Medicine

## 2015-08-02 VITALS — BP 147/74 | HR 57 | Temp 98.4°F | Resp 17 | Ht 67.5 in | Wt 163.5 lb

## 2015-08-02 DIAGNOSIS — C9110 Chronic lymphocytic leukemia of B-cell type not having achieved remission: Secondary | ICD-10-CM

## 2015-08-02 LAB — LACTATE DEHYDROGENASE (CC13): LDH: 222 U/L (ref 125–245)

## 2015-08-02 LAB — COMPREHENSIVE METABOLIC PANEL (CC13)
ALBUMIN: 3.7 g/dL (ref 3.5–5.0)
ALK PHOS: 41 U/L (ref 40–150)
ALT: 13 U/L (ref 0–55)
AST: 12 U/L (ref 5–34)
Anion Gap: 7 mEq/L (ref 3–11)
BUN: 17.8 mg/dL (ref 7.0–26.0)
CALCIUM: 9.4 mg/dL (ref 8.4–10.4)
CHLORIDE: 107 meq/L (ref 98–109)
CO2: 27 mEq/L (ref 22–29)
Creatinine: 1.3 mg/dL (ref 0.7–1.3)
EGFR: 56 mL/min/{1.73_m2} — AB (ref >90)
Glucose: 222 mg/dl — ABNORMAL HIGH (ref 70–140)
POTASSIUM: 5 meq/L (ref 3.5–5.1)
Sodium: 141 mEq/L (ref 136–145)
Total Bilirubin: 0.4 mg/dL (ref 0.20–1.20)
Total Protein: 6.2 g/dL — ABNORMAL LOW (ref 6.4–8.3)

## 2015-08-02 LAB — CBC WITH DIFFERENTIAL/PLATELET
BASO%: 0.3 % (ref 0.0–2.0)
Basophils Absolute: 0.1 10*3/uL (ref 0.0–0.1)
EOS ABS: 0.4 10*3/uL (ref 0.0–0.5)
EOS%: 0.9 % (ref 0.0–7.0)
HEMATOCRIT: 39.6 % (ref 38.4–49.9)
HEMOGLOBIN: 12.9 g/dL — AB (ref 13.0–17.1)
LYMPH%: 84.9 % — ABNORMAL HIGH (ref 14.0–49.0)
MCH: 30.7 pg (ref 27.2–33.4)
MCHC: 32.6 g/dL (ref 32.0–36.0)
MCV: 94.1 fL (ref 79.3–98.0)
MONO#: 1.2 10*3/uL — ABNORMAL HIGH (ref 0.1–0.9)
MONO%: 2.8 % (ref 0.0–14.0)
NEUT#: 4.7 10*3/uL (ref 1.5–6.5)
NEUT%: 11.1 % — AB (ref 39.0–75.0)
Platelets: 255 10*3/uL (ref 140–400)
RBC: 4.2 10*6/uL (ref 4.20–5.82)
RDW: 14.8 % — AB (ref 11.0–14.6)
WBC: 42.1 10*3/uL — ABNORMAL HIGH (ref 4.0–10.3)
lymph#: 35.8 10*3/uL — ABNORMAL HIGH (ref 0.9–3.3)

## 2015-08-02 LAB — TECHNOLOGIST REVIEW

## 2015-08-02 NOTE — Progress Notes (Signed)
[address] [phone]   Fax:([phone]) [place], [place] [address]  DIAGNOSIS: Chronic lymphocytic leukemia diagnosed in October 2014.  PRIOR THERAPY: None  CURRENT THERAPY: Observation.  INTERVAL HISTORY: Tony Marshall 74 y.o. male returns to the clinic today for follow-up visit accompanied by his wife. The patient is feeling fine today with no specific complaints. He denied having any significant weight loss but has occasional night sweats. He has no chest pain, shortness of breath, cough or hemoptysis. No significant nausea or vomiting. He had repeat CBC performed earlier today and his here for evaluation and discussion of his lab results.  MEDICAL HISTORY: Past Medical History  Diagnosis Date  . Diabetes mellitus without complication   . Hypertension   . Sleep apnea   . Lymphocytosis   . CLL (chronic lymphocytic leukemia)   . GERD (gastroesophageal reflux disease)   . Hyperlipidemia     ALLERGIES:  is allergic to glycopyrrolate.  MEDICATIONS:  Current Outpatient Prescriptions  Medication Sig Dispense Refill  . amLODipine (NORVASC) 10 MG tablet Take 1 tablet (10 mg total) by mouth daily. 90 tablet 1  . aspirin 81 MG EC tablet Take 81 mg by mouth daily. Swallow whole.    . benazepril (LOTENSIN) 20 MG tablet TAKE ONE TABLET BY MOUTH ONCE DAILY 90 tablet 1  . carvedilol (COREG) 25 MG tablet Take 1 tablet (25 mg total) by mouth 2 (two) times daily with a meal. 180 tablet 1  . doxazosin (CARDURA) 4 MG tablet Take 1 tablet (4 mg total) by mouth at bedtime. 90 tablet 1  . DUREZOL 0.05 % EMUL Place 1 drop into the right eye 2 (two) times daily.    Mar[facility] Kitchen esomeprazole (NEXIUM) 40 MG capsule Take 40 mg by mouth daily at 12 noon.    Marland Kitchen glucagon (GLUCAGON EMERGENCY) 1 MG injection Inject 1 mg into the vein once as needed. 1 each 5  . glucose blood (ONE TOUCH ULTRA TEST) test strip Use as  instructed to check blood sugars three times a day dx code E11.65 100 each 5  . HUMALOG KWIKPEN 100 UNIT/ML KiwkPen INJECT 3-9 UNITS AT BREAKFAST 3-4 UNITS AT LUNCH 6-9 UNITS AT DINNER 15 pen 1  . LEVITRA 20 MG tablet TAKE ONE TABLET BY MOUTH AS NEEDED FOR ERECTILE DYSFUNCTION 3 tablet 0  . metFORMIN (GLUCOPHAGE-XR) 750 MG 24 hr tablet TAKE 2 TABLETS BY MOUTH AFTER DINNER. 60 tablet 5  . methocarbamol (ROBAXIN) 500 MG tablet Take 1 tablet (500 mg total) by mouth every 8 (eight) hours as needed for muscle spasms. 30 tablet 3  . pioglitazone (ACTOS) 15 MG tablet Take 1 tablet (15 mg total) by mouth daily. 90 tablet 1  . rosuvastatin (CRESTOR) 5 MG tablet Take 5 mg by mouth every morning.    . saccharomyces boulardii (FLORASTOR) 250 MG capsule Take 250 mg by mouth 2 (two) times daily.    Marland Kitchen zolpidem (AMBIEN) 10 MG tablet Take 1 tablet at bedtime as needed 90 tablet 1   No current facility-administered medications for this visit.    SURGICAL HISTORY:  Past Surgical History  Procedure Laterality Date  . Hemorrhoid surgery      Sclerotherapy  . Blepharoplasty      REVIEW OF SYSTEMS:  A comprehensive review of systems was negative.   PHYSICAL EXAMINATION: General appearance: alert, cooperative and no distress Head: Normocephalic, without obvious abnormality,  atraumatic Neck: no adenopathy, no JVD, supple, symmetrical, trachea midline and thyroid not enlarged, symmetric, no tenderness/mass/nodules Lymph nodes: Cervical, supraclavicular, and axillary nodes normal. Resp: clear to auscultation bilaterally Back: symmetric, no curvature. ROM normal. No CVA tenderness. Cardio: regular rate and rhythm, S1, S2 normal, no murmur, click, rub or gallop GI: soft, non-tender; bowel sounds normal; no masses,  no organomegaly Extremities: extremities normal, atraumatic, no cyanosis or edema  ECOG PERFORMANCE STATUS: 0 - Asymptomatic  Blood pressure 147/74, pulse 57, temperature 98.4 F (36.9 C),  temperature source Oral, resp. rate 17, height 5' 7.5" (1.715 m), weight 163 lb 8 oz (74.163 kg), SpO2 99 %.  LABORATORY DATA: Lab Results  Component Value Date   WBC 42.1* 08/02/2015   HGB 12.9* 08/02/2015   HCT 39.6 08/02/2015   MCV 94.1 08/02/2015   PLT 255 08/02/2015      Chemistry      Component Value Date/Time   NA 140 07/10/2015 1038   NA 143 02/01/2015 1426   K 4.0 07/10/2015 1038   K 4.2 02/01/2015 1426   CL 104 07/10/2015 1038   CO2 31 07/10/2015 1038   CO2 25 02/01/2015 1426   BUN 16 07/10/2015 1038   BUN 17.4 02/01/2015 1426   CREATININE 1.09 07/10/2015 1038   CREATININE 1.1 02/01/2015 1426      Component Value Date/Time   CALCIUM 9.3 07/10/2015 1038   CALCIUM 8.7 02/01/2015 1426   ALKPHOS 34* 07/10/2015 1038   ALKPHOS 42 02/01/2015 1426   AST 13 07/10/2015 1038   AST 14 02/01/2015 1426   ALT 10 07/10/2015 1038   ALT 13 02/01/2015 1426   BILITOT 0.5 07/10/2015 1038   BILITOT 0.31 02/01/2015 1426       RADIOGRAPHIC STUDIES: No results found.  ASSESSMENT AND PLAN: This is a very pleasant 74 years old white male with history of chronic lymphocytic leukemia currently on observation. His CBC today showed further increase in the total white blood count concerning for disease progression. The patient is currently asymptomatic. I recommended for him to continue on observation with repeat CBC, comprehensive metabolic panel and LDH in 3 months. He was advised to call immediately if he has any concerning symptoms in the interval. The patient voices understanding of current disease status and treatment options and is in agreement with the current care plan.  All questions were answered. The patient knows to call the clinic with any problems, questions or concerns. We can certainly see the patient much sooner if necessary.  Disclaimer: This note was dictated with voice recognition software. Similar sounding words can inadvertently be transcribed and may not be  corrected upon review.

## 2015-08-02 NOTE — Telephone Encounter (Signed)
Gave avs & calendar for November.  °

## 2015-08-10 ENCOUNTER — Telehealth: Payer: Self-pay | Admitting: Endocrinology

## 2015-08-10 ENCOUNTER — Other Ambulatory Visit: Payer: Self-pay | Admitting: *Deleted

## 2015-08-10 MED ORDER — AMLODIPINE BESYLATE 10 MG PO TABS: 10.0000 mg | ORAL_TABLET | Freq: Every day | ORAL | Status: DC

## 2015-08-10 NOTE — Telephone Encounter (Signed)
rx sent

## 2015-08-10 NOTE — Telephone Encounter (Signed)
Pt needs refill on amlodipine 10mg  ( 90 day supply) please call into [facility] in  [phone]

## 2015-08-23 DIAGNOSIS — M659 Synovitis and tenosynovitis, unspecified: Secondary | ICD-10-CM | POA: Diagnosis not present

## 2015-09-12 ENCOUNTER — Other Ambulatory Visit: Payer: Self-pay | Admitting: Endocrinology

## 2015-09-13 ENCOUNTER — Other Ambulatory Visit: Payer: Self-pay | Admitting: *Deleted

## 2015-09-13 MED ORDER — ZOLPIDEM TARTRATE 10 MG PO TABS: ORAL_TABLET | ORAL | Status: DC

## 2015-10-02 ENCOUNTER — Other Ambulatory Visit: Payer: Self-pay | Admitting: Endocrinology

## 2015-10-06 ENCOUNTER — Telehealth: Payer: Self-pay | Admitting: *Deleted

## 2015-10-06 ENCOUNTER — Other Ambulatory Visit (INDEPENDENT_AMBULATORY_CARE_PROVIDER_SITE_OTHER): Payer: Medicare Other

## 2015-10-06 DIAGNOSIS — D72820 Lymphocytosis (symptomatic): Secondary | ICD-10-CM | POA: Diagnosis not present

## 2015-10-06 DIAGNOSIS — E1165 Type 2 diabetes mellitus with hyperglycemia: Secondary | ICD-10-CM | POA: Diagnosis not present

## 2015-10-06 DIAGNOSIS — IMO0002 Reserved for concepts with insufficient information to code with codable children: Secondary | ICD-10-CM

## 2015-10-06 LAB — BASIC METABOLIC PANEL
BUN: 19 mg/dL (ref 6–23)
CHLORIDE: 105 meq/L (ref 96–112)
CO2: 31 meq/L (ref 19–32)
CREATININE: 1.05 mg/dL (ref 0.40–1.50)
Calcium: 9.2 mg/dL (ref 8.4–10.5)
GFR: 73.82 mL/min (ref >60.00)
Glucose, Bld: 109 mg/dL — ABNORMAL HIGH (ref 70–99)
POTASSIUM: 4.2 meq/L (ref 3.5–5.1)
Sodium: 140 mEq/L (ref 135–145)

## 2015-10-06 LAB — CBC
HEMATOCRIT: 39.8 % (ref 39.0–52.0)
HEMOGLOBIN: 12.9 g/dL — AB (ref 13.0–17.0)
MCHC: 32.3 g/dL (ref 30.0–36.0)
MCV: 95 fl (ref 78.0–100.0)
Platelets: 239 10*3/uL (ref 150.0–400.0)
RBC: 4.19 Mil/uL — AB (ref 4.22–5.81)
RDW: 15 % (ref 11.5–15.5)

## 2015-10-06 LAB — LIPID PANEL
CHOL/HDL RATIO: 3
Cholesterol: 141 mg/dL (ref 0–200)
HDL: 45 mg/dL (ref >39.00)
LDL CALC: 82 mg/dL (ref 0–99)
NONHDL: 96.19
TRIGLYCERIDES: 72 mg/dL (ref 0.0–149.0)
VLDL: 14.4 mg/dL (ref 0.0–40.0)

## 2015-10-06 LAB — HEMOGLOBIN A1C: Hgb A1c MFr Bld: 8.1 % — ABNORMAL HIGH (ref 4.6–6.5)

## 2015-10-06 NOTE — Telephone Encounter (Signed)
noted 

## 2015-10-06 NOTE — Telephone Encounter (Signed)
Santiago Glad from [facility] called with a critical on Mr. Waterson, his wbc is 51,500

## 2015-10-06 NOTE — Telephone Encounter (Signed)
We will forward this to his hematologist Dr. Inda Merlin

## 2015-10-08 NOTE — Telephone Encounter (Signed)
No worries. This is consistent with his CLL. Will address next visit.

## 2015-10-11 ENCOUNTER — Ambulatory Visit (INDEPENDENT_AMBULATORY_CARE_PROVIDER_SITE_OTHER): Payer: Medicare Other | Admitting: Endocrinology

## 2015-10-11 ENCOUNTER — Encounter: Payer: Self-pay | Admitting: Endocrinology

## 2015-10-11 VITALS — BP 128/62 | HR 65 | Temp 98.2°F | Resp 12 | Ht 67.5 in | Wt 165.8 lb

## 2015-10-11 DIAGNOSIS — E1165 Type 2 diabetes mellitus with hyperglycemia: Secondary | ICD-10-CM

## 2015-10-11 DIAGNOSIS — Z23 Encounter for immunization: Secondary | ICD-10-CM

## 2015-10-11 DIAGNOSIS — Z0000 Encounter for general adult medical examination without abnormal findings: Secondary | ICD-10-CM

## 2015-10-11 DIAGNOSIS — E7800 Pure hypercholesterolemia, unspecified: Secondary | ICD-10-CM

## 2015-10-11 DIAGNOSIS — N528 Other male erectile dysfunction: Secondary | ICD-10-CM | POA: Diagnosis not present

## 2015-10-11 DIAGNOSIS — N62 Hypertrophy of breast: Secondary | ICD-10-CM

## 2015-10-11 DIAGNOSIS — Z794 Long term (current) use of insulin: Secondary | ICD-10-CM

## 2015-10-11 DIAGNOSIS — C9110 Chronic lymphocytic leukemia of B-cell type not having achieved remission: Secondary | ICD-10-CM

## 2015-10-11 DIAGNOSIS — I10 Essential (primary) hypertension: Secondary | ICD-10-CM

## 2015-10-11 NOTE — Patient Instructions (Addendum)
Toujeo in am, not pms, 14 units and keep am sugar in 90-140 range  Take 10-12 Humalog at supper  If sugar over 150, add 1 unit per 50 pts high  More sugars before and after meals  Check coverage for Tresiba insulin  Get living will

## 2015-10-11 NOTE — Progress Notes (Signed)
Patient ID: Tony Marshall, male   DOB: 11-17-1941, 74 y.o.   MRN: [id number]   Reason for Appointment: Annual physical exam  History of Present Illness   1. Type 2 DIABETES MELITUS, date of diagnosis:  1992        His blood sugar has been  difficult to control over the last few years because of variability in his blood sugars On basal bolus insulin for a few years and his oral hypoglycemic drugs have been continued Also did not tolerate GLP-1 drugs or symlin because of nausea He is essentially looking like a a type I patient with requiring relatively low amounts of insulin and twice a day dosage of basal insulin He has done somewhat better with Levemir compared to Lantus and is using this twice a day also  Recent history:  Insulin regimen: Toujeo insulin 14 units at night.  Mealtime insulin: Breakfast = 4 units, lunch 0-4  supper 9-10 units based on meal size   He still has an A1c over 8% and has difficulty getting consistent control of his blood sugars Although his fasting blood sugars were better with Toujeo instead of Levemir twice a day they are not consistent   Recent blood sugar patterns and problems:  His blood sugars are quite variable in the mornings and he does not know why.  He cannot correlate his morning readings with his meal the night before.  He does not adjust his Toujeo even if fasting readings are consistently high, was given a worksheet on the last visit to make adjustments  His fasting glucose was lower in the lab at an hour after he checked his sugar in the morning the same day  He has a few readings at bedtime when these are consistently high around 200  He tends to have high readings before supper and he says that this is without any snacks in the afternoon and usually not eating any lunch.  Usually does not check his blood sugars after breakfast, has only one high reading on a Sunday  He will take additional 2-3 units of Humalog  if blood  sugars are significantly high   Hypoglycemia:  has 3 episodes of morning hypoglycemia between 7-9 AM  with lowest glucose 48  Oral hypoglycemic drugs: Actos, Metformin        Side effects from medications: None       Proper timing of medications in relation to meals: Yes.          Monitors blood glucose: QD    Glucometer: One Touch.          Blood Glucose readings from one touch zoom software download:   Mean values apply above for all meters except median for One Touch  PRE-MEAL Fasting Lunch Dinner Bedtime Overall  Glucose range:  48-267   148-254  197-201    Mean/median:  160    180    179     Meals: 2 meals per day.   He is eating less fried and carbohydrates overall and recently no lunch Has Bfst at 10 am dinner at 7-8 PM    Physical activity: exercise: Golf, gardening            LABS:  Lab Results  Component Value Date   HGBA1C 8.1* 10/06/2015   HGBA1C 8.3* 07/10/2015   HGBA1C 8.1* 03/06/2015   Lab Results  Component Value Date   MICROALBUR 1.6 03/06/2015   LDLCALC 82 10/06/2015   CREATININE 1.05 10/06/2015  2. Hypertension:    His blood pressure has been high for about 30 years.  He was evaluated in 1/08 with urine metanephrines and renal artery ultrasound.  Recent home blood pressure about 120-130/70, checking periodically. BP has been well controlled consistently over the last few years   3. PREVENTIVE CARE:  Annual hemoccults:           due now    Zostavax:                             2012   Colonoscopy/sigmoidoscopy  2012   Flu vaccine yes  PSA:  not indicated   Pneumovax: Prevnar:   2000 2015  Eye exams:  annual   Aspirin: Yes    Exercise: Not consistently Aspirin use: Takes 81 mg daily Diet: Usually relatively low fat Use of seatbelts and smoke detectors: Yes   Appointment on 10/06/2015  Component Date Value Ref Range Status  . Hgb A1c MFr Bld 10/06/2015 8.1* 4.6 - 6.5 % Final   Glycemic Control Guidelines for People with Diabetes:Non  Diabetic:  <6%Goal of Therapy: <7%Additional Action Suggested:  >8%   . Sodium 10/06/2015 140  135 - 145 mEq/L Final  . Potassium 10/06/2015 4.2  3.5 - 5.1 mEq/L Final  . Chloride 10/06/2015 105  96 - 112 mEq/L Final  . CO2 10/06/2015 31  19 - 32 mEq/L Final  . Glucose, Bld 10/06/2015 109* 70 - 99 mg/dL Final  . BUN 10/06/2015 19  6 - 23 mg/dL Final  . Creatinine, Ser 10/06/2015 1.05  0.40 - 1.50 mg/dL Final  . Calcium 10/06/2015 9.2  8.4 - 10.5 mg/dL Final  . GFR 10/06/2015 73.82  >60.00 mL/min Final  . WBC 10/06/2015 51.5 Repeated and verified X2.* 4.0 - 10.5 K/uL Final  . RBC 10/06/2015 4.19* 4.22 - 5.81 Mil/uL Final  . Platelets 10/06/2015 239.0  150.0 - 400.0 K/uL Final  . Hemoglobin 10/06/2015 12.9* 13.0 - 17.0 g/dL Final  . HCT 10/06/2015 39.8  39.0 - 52.0 % Final  . MCV 10/06/2015 95.0  78.0 - 100.0 fl Final  . MCHC 10/06/2015 32.3  30.0 - 36.0 g/dL Final  . RDW 10/06/2015 15.0  11.5 - 15.5 % Final  . Cholesterol 10/06/2015 141  0 - 200 mg/dL Final   ATP III Classification       Desirable:  < 200 mg/dL               Borderline High:  200 - 239 mg/dL          High:  > = 240 mg/dL  . Triglycerides 10/06/2015 72.0  0.0 - 149.0 mg/dL Final   Normal:  <150 mg/dLBorderline High:  150 - 199 mg/dL  . HDL 10/06/2015 45.00  >39.00 mg/dL Final  . VLDL 10/06/2015 14.4  0.0 - 40.0 mg/dL Final  . LDL Cholesterol 10/06/2015 82  0 - 99 mg/dL Final  . Total CHOL/HDL Ratio 10/06/2015 3   Final                  Men          Women1/2 Average Risk     3.4          3.3Average Risk          5.0          4.42X Average Risk          9.6  7.13X Average Risk          15.0          11.0                      . NonHDL 10/06/2015 96.19   Final   NOTE:  Non-HDL goal should be 30 mg/dL higher than patient's LDL goal (i.e. LDL goal of < 70 mg/dL, would have non-HDL goal of < 100 mg/dL)      Medication List       This list is accurate as of: 10/11/15  4:27 PM.  Always use your most recent med list.                 amLODipine 10 MG tablet  Commonly known as:  NORVASC  Take 1 tablet (10 mg total) by mouth daily.     aspirin 81 MG EC tablet  Take 81 mg by mouth daily. Swallow whole.     benazepril 20 MG tablet  Commonly known as:  LOTENSIN  TAKE ONE TABLET BY MOUTH ONCE DAILY     carvedilol 25 MG tablet  Commonly known as:  COREG  TAKE 1 TABLET BY MOUTH TWICE DAILY WITH MEALS     doxazosin 4 MG tablet  Commonly known as:  CARDURA  TAKE 1 TABLET BY MOUTH ATBEDTIME     DUREZOL 0.05 % Emul  Generic drug:  Difluprednate  Place 1 drop into the right eye 2 (two) times daily.     esomeprazole 40 MG capsule  Commonly known as:  NEXIUM  Take 40 mg by mouth daily at 12 noon.     FLORASTOR 250 MG capsule  Generic drug:  saccharomyces boulardii  Take 250 mg by mouth 2 (two) times daily.     glucagon 1 MG injection  Commonly known as:  GLUCAGON EMERGENCY  Inject 1 mg into the vein once as needed.     glucose blood test strip  Commonly known as:  ONE TOUCH ULTRA TEST  Use as instructed to check blood sugars three times a day dx code E11.65     HUMALOG KWIKPEN 100 UNIT/ML KiwkPen  Generic drug:  insulin lispro  INJECT 3-9 UNITS AT BREAKFAST 3-4 UNITS AT LUNCH 6-9 UNITS AT DINNER     LEVITRA 20 MG tablet  Generic drug:  vardenafil  TAKE ONE TABLET BY MOUTH AS NEEDED FOR ERECTILE DYSFUNCTION     metFORMIN 750 MG 24 hr tablet  Commonly known as:  GLUCOPHAGE-XR  TAKE 2 TABLETS BY MOUTH AFTER DINNER.     methocarbamol 500 MG tablet  Commonly known as:  ROBAXIN  Take 1 tablet (500 mg total) by mouth every 8 (eight) hours as needed for muscle spasms.     pioglitazone 15 MG tablet  Commonly known as:  ACTOS  TAKE 1 TABLET BY MOUTH DAILY.     rosuvastatin 5 MG tablet  Commonly known as:  CRESTOR  Take 5 mg by mouth every morning.     zolpidem 10 MG tablet  Commonly known as:  AMBIEN  Take 1 tablet at bedtime as needed        Allergies:  Allergies  Allergen  Reactions  . Glycopyrrolate Rash    Past Medical History  Diagnosis Date  . Diabetes mellitus without complication ([place])   . Hypertension   . Sleep apnea   . Lymphocytosis   . CLL (chronic lymphocytic leukemia) ([place])   . GERD (gastroesophageal reflux disease)   .  Hyperlipidemia     Past Surgical History  Procedure Laterality Date  . Hemorrhoid surgery      Sclerotherapy  . Blepharoplasty      Family History  Problem Relation Age of Onset  . Kidney disease Mother   . CAD Maternal Aunt     Social History:  reports that he has never smoked. He does not have any smokeless tobacco history on file. He reports that he drinks alcohol. He reports that he does not use illicit drugs.  Review of Systems:  Review of Systems  Constitutional: Negative for weight loss and malaise/fatigue.  HENT: Negative for hearing loss.   Eyes: Negative for blurred vision.  Respiratory: Negative for cough and shortness of breath.   Cardiovascular: Negative for chest pain, palpitations, claudication and leg swelling.  Gastrointestinal: Negative for abdominal pain and constipation.  Genitourinary: Negative for urgency and frequency.       Has erectile dysfunction, long-standing and has fair results with using Levitra Rare nocturia, stream normal, no frequency  Musculoskeletal: Negative for myalgias.       Pain in knees present  Neurological: Positive for tingling. Negative for dizziness, sensory change and headaches.       Rare tingling with tight shoes  Psychiatric/Behavioral: Negative for depression and memory loss. The patient has insomnia.        Takes Ambien every night for sleep, has chronic insomnia    He previously has had problems with reflux and dysphagia, occasionally may feel the food going down slowly in the lower chest  HYPERLIPIDEMIA: The lipid abnormality consists of elevated LDL which is very well controlled with low-dose Crestor  Lab Results  Component Value Date   CHOL 141  10/06/2015   HDL 45.00 10/06/2015   LDLCALC 82 10/06/2015   TRIG 72.0 10/06/2015   CHOLHDL 3 10/06/2015    Last colonoscopy was in April 2012    He has mild CLL which is followed is by the hematologist and no treatment currently needed recent WBC count is over 50,000 Currently denies any night sweats, weight loss, decreased appetite or weakness Does not have any anemia or thrombocytopenia  Chronic insomnia present and adequately treated with Ambien daily  Diagnosed with sleep apnea (11/09) treated with CPAP. Sleeps better and less daytime fatigue.    Examination:   BP 128/62 mmHg  Pulse 65  Temp(Src) 98.2 F (36.8 C) (Oral)  Resp 12  Ht 5' 7.5" (1.715 m)  Wt 165 lb 12.8 oz (75.206 kg)  BMI 25.57 kg/m2  SpO2 97%  Body mass index is 25.57 kg/(m^2).   GENERAL:  Average build.  No pallor, clubbing or edema.  Skin:  no rash or abnormal pigmentation  EYES:  Externally normal.  Fundii:  normal discs and vessels.  ENT: . Oral cavity & pharynx normal.    NECK: No lymphadenopathy. Carotids: Normal character; no bruit. THYROID:  Not palpable.  HEART:  Normal apex, S1 and S2; no murmur or click.  CHEST: He has mild gynecomastia on the left, minimal on the right No lymphadenopathy in the axillary area  Lungs:  Vescicular breath sounds heard equally.  No crepitations/ wheeze.  ABDOMEN:    Liver/spleen not palpable.  No other mass or tenderness.     RECTAL exam: No mass  Prostate:  No nodule felt; 1+ smooth enlargement.    NEUROLOGICAL:  Reflexes: absent at ankles; 1+ at biceps Monofilament sensation normal at toes.  Vibration normal at toes. Pedal pulses normal  SPINE AND JOINTS:  Amputation right 2nd finger.  No swelling or deformity of other joints.   ASSESSMENT/ PLAN:   Diabetes type 2, inadequately controlled with persistently high A1c over 8% See history of present illness for detailed discussion of his current management, blood sugar patterns and problems  identified  He has difficulty getting consistent control of even overnight blood sugars and average sugar in the morning is 160 Also difficult to adjust his insulin at mealtime since he often does not monitor readings 2 hours after eating His readings at bedtime appear to be consistently high and probably has relatively high readings before supper time also Variability in the morning readings may be related to the type of meal he is having although he is generally trying to eat healthy Also not consistently exercising  Recommended the following today  More frequent glucose monitoring before and after meals  Trial of Toujeo in the morning instead of bedtime to see if it is working 24 hours and this may reduce variability in the mornings  He will need  to adjust Toujeo every 7 days at least based on fasting blood sugar trend  He probably needs at least 1-3 units more for her evening meal coverage, he will adjust this to keep 2 hour readings and target  More blood sugars after breakfast to help adjust his morning dosage  Correction factor discussed for high readings  Diabetic complications: No significant neuropathy, does have erectile dysfunction, no evidence of nephropathy or history of retinopathy  Hyperlipidemia: Adequately controlled LDL, HDL and triglycerides  HYPERTENSION: Well controlled and will continue same regimen, he will also monitor regularly at home  GYNECOMASTIA, decreased motivation and history of erectile dysfunction: He may have hypogonadism  Sleep apnea, treated with CPAP  History of gastroesophageal reflux with occasional dysphagia   CLL: Currently no systemic symptoms present despite very high white cell count, will defer management to hematologist  Preventive care:  He is up-to-date on immunizations except tetanus, colonoscopy as per gastroenterologist Needs influenza vaccine today, given To continue annual eye exam Continue 81 mg aspirin Follow  consistent regimen of exercise with walking and outside activities At least annual lipid levels in followup Needs stool Hemoccult Recommended having living will and healthcare power of attorney  Total visit time evaluating clinical problems with history and exam, labs and management = 25 minutes, greater than 50%  on counseling  Also 30 minutes spent in preventive care  Patient Instructions  Toujeo in am, not pms, 14 units and keep am sugar in 90-140 range  Take 10-12 Humalog at supper  If sugar over 150, add 1 unit per 50 pts high  More sugars before and after meals  Check coverage for Tresiba insulin  Get living will     [facility] 10/11/2015, 4:27 PM

## 2015-10-17 ENCOUNTER — Other Ambulatory Visit: Payer: Self-pay | Admitting: Endocrinology

## 2015-10-30 ENCOUNTER — Telehealth: Payer: Self-pay | Admitting: Endocrinology

## 2015-10-30 NOTE — Telephone Encounter (Signed)
Patient wife need to know what's the name of the new insulin Dr Dwyane Dee wants her husband to take, please advise

## 2015-10-30 NOTE — Telephone Encounter (Signed)
I spoke with the patient to let him know that Tyler Aas was the name of the new insulin.

## 2015-10-31 ENCOUNTER — Other Ambulatory Visit (HOSPITAL_BASED_OUTPATIENT_CLINIC_OR_DEPARTMENT_OTHER): Payer: Medicare Other

## 2015-10-31 ENCOUNTER — Ambulatory Visit (HOSPITAL_BASED_OUTPATIENT_CLINIC_OR_DEPARTMENT_OTHER): Payer: Medicare Other | Admitting: Internal Medicine

## 2015-10-31 ENCOUNTER — Encounter: Payer: Self-pay | Admitting: Internal Medicine

## 2015-10-31 ENCOUNTER — Telehealth: Payer: Self-pay | Admitting: Internal Medicine

## 2015-10-31 VITALS — BP 142/64 | HR 58 | Temp 98.2°F | Resp 18 | Ht 67.5 in | Wt 165.3 lb

## 2015-10-31 DIAGNOSIS — C9110 Chronic lymphocytic leukemia of B-cell type not having achieved remission: Secondary | ICD-10-CM

## 2015-10-31 LAB — CBC WITH DIFFERENTIAL/PLATELET
BASO%: 0.3 % (ref 0.0–2.0)
Basophils Absolute: 0.1 10*3/uL (ref 0.0–0.1)
EOS%: 0.7 % (ref 0.0–7.0)
Eosinophils Absolute: 0.3 10*3/uL (ref 0.0–0.5)
HCT: 39.4 % (ref 38.4–49.9)
HGB: 12.6 g/dL — ABNORMAL LOW (ref 13.0–17.1)
LYMPH%: 85.4 % — AB (ref 14.0–49.0)
MCH: 30.6 pg (ref 27.2–33.4)
MCHC: 32 g/dL (ref 32.0–36.0)
MCV: 95.5 fL (ref 79.3–98.0)
MONO#: 1.3 10*3/uL — AB (ref 0.1–0.9)
MONO%: 3 % (ref 0.0–14.0)
NEUT%: 10.6 % — AB (ref 39.0–75.0)
NEUTROS ABS: 4.7 10*3/uL (ref 1.5–6.5)
PLATELETS: 214 10*3/uL (ref 140–400)
RBC: 4.12 10*6/uL — AB (ref 4.20–5.82)
RDW: 13.7 % (ref 11.0–14.6)
WBC: 44.1 10*3/uL — AB (ref 4.0–10.3)
lymph#: 37.7 10*3/uL — ABNORMAL HIGH (ref 0.9–3.3)

## 2015-10-31 LAB — COMPREHENSIVE METABOLIC PANEL (CC13)
ALT: 14 U/L (ref 0–55)
ANION GAP: 7 meq/L (ref 3–11)
AST: 15 U/L (ref 5–34)
Albumin: 3.6 g/dL (ref 3.5–5.0)
Alkaline Phosphatase: 44 U/L (ref 40–150)
BILIRUBIN TOTAL: 0.33 mg/dL (ref 0.20–1.20)
BUN: 16.8 mg/dL (ref 7.0–26.0)
CHLORIDE: 107 meq/L (ref 98–109)
CO2: 26 meq/L (ref 22–29)
CREATININE: 1 mg/dL (ref 0.7–1.3)
Calcium: 9.2 mg/dL (ref 8.4–10.4)
EGFR: 71 mL/min/{1.73_m2} — ABNORMAL LOW (ref >90)
GLUCOSE: 231 mg/dL — AB (ref 70–140)
Potassium: 4.1 mEq/L (ref 3.5–5.1)
SODIUM: 140 meq/L (ref 136–145)
TOTAL PROTEIN: 6 g/dL — AB (ref 6.4–8.3)

## 2015-10-31 LAB — TECHNOLOGIST REVIEW

## 2015-10-31 LAB — LACTATE DEHYDROGENASE (CC13): LDH: 218 U/L (ref 125–245)

## 2015-10-31 NOTE — Telephone Encounter (Signed)
per pof to sch pt appt-gave pt copy of avs °

## 2015-10-31 NOTE — Progress Notes (Signed)
[place] [phone]   Fax:([phone]) [place], [place] [address]  DIAGNOSIS: Chronic lymphocytic leukemia diagnosed in October 2014.  PRIOR THERAPY: None  CURRENT THERAPY: Observation.  INTERVAL HISTORY: Tony Marshall 74 y.o. male returns to the clinic today for routine three-month follow-up visit accompanied by his wife. The patient is feeling fine today with no specific complaints. He denied having any significant weight loss or night sweats. He has no chest pain, shortness of breath, cough or hemoptysis. He has no significant nausea or vomiting. He had repeat CBC performed earlier today and his here for evaluation and discussion of his lab results.  MEDICAL HISTORY: Past Medical History  Diagnosis Date  . Diabetes mellitus without complication ([place])   . Hypertension   . Sleep apnea   . Lymphocytosis   . CLL (chronic lymphocytic leukemia) ([place])   . GERD (gastroesophageal reflux disease)   . Hyperlipidemia     ALLERGIES:  is allergic to glycopyrrolate.  MEDICATIONS:  Current Outpatient Prescriptions  Medication Sig Dispense Refill  . amLODipine (NORVASC) 10 MG tablet Take 1 tablet (10 mg total) by mouth daily. 90 tablet 1  . aspirin 81 MG EC tablet Take 81 mg by mouth daily. Swallow whole.    . benazepril (LOTENSIN) 20 MG tablet TAKE 1 TABLET BY MOUTH ONCE DAILY 90 tablet 0  . carvedilol (COREG) 25 MG tablet TAKE 1 TABLET BY MOUTH TWICE DAILY WITH MEALS 180 tablet 1  . doxazosin (CARDURA) 4 MG tablet TAKE 1 TABLET BY MOUTH ATBEDTIME 90 tablet 1  . DUREZOL 0.05 % EMUL Place 1 drop into the right eye 2 (two) times daily.    Marland Kitchen esomeprazole (NEXIUM) 40 MG capsule Take 40 mg by mouth daily at 12 noon.    Marland Kitchen glucagon (GLUCAGON EMERGENCY) 1 MG injection Inject 1 mg into the vein once as needed. 1 each 5  . glucose blood (ONE TOUCH ULTRA TEST) test strip Use as instructed to check  blood sugars three times a day dx code E11.65 100 each 5  . HUMALOG KWIKPEN 100 UNIT/ML KiwkPen INJECT 3-9 UNITS AT BREAKFAST 3-4 UNITS AT LUNCH 6-9 UNITS AT DINNER 15 pen 1  . metFORMIN (GLUCOPHAGE-XR) 750 MG 24 hr tablet TAKE 2 TABLETS BY MOUTH AFTER DINNER. 60 tablet 5  . pioglitazone (ACTOS) 15 MG tablet TAKE 1 TABLET BY MOUTH DAILY. 90 tablet 1  . rosuvastatin (CRESTOR) 5 MG tablet Take 5 mg by mouth every morning.    . saccharomyces boulardii (FLORASTOR) 250 MG capsule Take 250 mg by mouth 2 (two) times daily.    Nelva Nay SOLOSTAR 300 UNIT/ML SOPN INJECT 18-19 UNITS INTO SKIN EVERY MORNING  2  . zolpidem (AMBIEN) 10 MG tablet Take 1 tablet at bedtime as needed 90 tablet 1   No current facility-administered medications for this visit.    SURGICAL HISTORY:  Past Surgical History  Procedure Laterality Date  . Hemorrhoid surgery      Sclerotherapy  . Blepharoplasty      REVIEW OF SYSTEMS:  A comprehensive review of systems was negative.   PHYSICAL EXAMINATION: General appearance: alert, cooperative and no distress Head: Normocephalic, without obvious abnormality, atraumatic Neck: no adenopathy, no JVD, supple, symmetrical, trachea midline and thyroid not enlarged, symmetric, no tenderness/mass/nodules Lymph nodes: Cervical, supraclavicular, and axillary nodes normal. Resp: clear to auscultation bilaterally Back: symmetric, no curvature. ROM normal. No CVA tenderness.  Cardio: regular rate and rhythm, S1, S2 normal, no murmur, click, rub or gallop GI: soft, non-tender; bowel sounds normal; no masses,  no organomegaly Extremities: extremities normal, atraumatic, no cyanosis or edema  ECOG PERFORMANCE STATUS: 0 - Asymptomatic  Blood pressure 142/64, pulse 58, temperature 98.2 F (36.8 C), temperature source Oral, resp. rate 18, height 5' 7.5" (1.715 m), weight 165 lb 4.8 oz (74.98 kg), SpO2 99 %.  LABORATORY DATA: Lab Results  Component Value Date   WBC 44.1* 10/31/2015   HGB  12.6* 10/31/2015   HCT 39.4 10/31/2015   MCV 95.5 10/31/2015   PLT 214 10/31/2015      Chemistry      Component Value Date/Time   NA 140 10/31/2015 1258   NA 140 10/06/2015 0855   K 4.1 10/31/2015 1258   K 4.2 10/06/2015 0855   CL 105 10/06/2015 0855   CO2 26 10/31/2015 1258   CO2 31 10/06/2015 0855   BUN 16.8 10/31/2015 1258   BUN 19 10/06/2015 0855   CREATININE 1.0 10/31/2015 1258   CREATININE 1.05 10/06/2015 0855      Component Value Date/Time   CALCIUM 9.2 10/31/2015 1258   CALCIUM 9.2 10/06/2015 0855   ALKPHOS 44 10/31/2015 1258   ALKPHOS 34* 07/10/2015 1038   AST 15 10/31/2015 1258   AST 13 07/10/2015 1038   ALT 14 10/31/2015 1258   ALT 10 07/10/2015 1038   BILITOT 0.33 10/31/2015 1258   BILITOT 0.5 07/10/2015 1038       RADIOGRAPHIC STUDIES: No results found.  ASSESSMENT AND PLAN: This is a very pleasant 74 years old white male with history of chronic lymphocytic leukemia currently on observation. His CBC today showed very mild increase increase in the total white blood count compared to 3 months ago concerning for disease progression. The patient is currently asymptomatic.  I discussed the lab result with the patient and his wife. I recommended for him to continue on observation with repeat CBC, comprehensive metabolic panel and LDH in 3 months. He was advised to call immediately if he has any concerning symptoms in the interval. The patient voices understanding of current disease status and treatment options and is in agreement with the current care plan.  All questions were answered. The patient knows to call the clinic with any problems, questions or concerns. We can certainly see the patient much sooner if necessary.  Disclaimer: This note was dictated with voice recognition software. Similar sounding words can inadvertently be transcribed and may not be corrected upon review.

## 2015-11-08 ENCOUNTER — Other Ambulatory Visit: Payer: Self-pay | Admitting: Endocrinology

## 2015-11-21 ENCOUNTER — Other Ambulatory Visit: Payer: Self-pay | Admitting: Endocrinology

## 2016-01-08 ENCOUNTER — Other Ambulatory Visit: Payer: Self-pay | Admitting: *Deleted

## 2016-01-08 ENCOUNTER — Other Ambulatory Visit (INDEPENDENT_AMBULATORY_CARE_PROVIDER_SITE_OTHER): Payer: Medicare Other

## 2016-01-08 DIAGNOSIS — E1165 Type 2 diabetes mellitus with hyperglycemia: Secondary | ICD-10-CM

## 2016-01-08 DIAGNOSIS — E118 Type 2 diabetes mellitus with unspecified complications: Secondary | ICD-10-CM | POA: Diagnosis not present

## 2016-01-08 LAB — COMPREHENSIVE METABOLIC PANEL
ALT: 14 U/L (ref 0–53)
AST: 16 U/L (ref 0–37)
Albumin: 3.7 g/dL (ref 3.5–5.2)
Alkaline Phosphatase: 38 U/L — ABNORMAL LOW (ref 39–117)
BILIRUBIN TOTAL: 0.4 mg/dL (ref 0.2–1.2)
BUN: 16 mg/dL (ref 6–23)
CALCIUM: 8.8 mg/dL (ref 8.4–10.5)
CO2: 30 meq/L (ref 19–32)
CREATININE: 1.06 mg/dL (ref 0.40–1.50)
Chloride: 106 mEq/L (ref 96–112)
GFR: 72.97 mL/min (ref >60.00)
GLUCOSE: 93 mg/dL (ref 70–99)
Potassium: 3.6 mEq/L (ref 3.5–5.1)
SODIUM: 141 meq/L (ref 135–145)
Total Protein: 5.7 g/dL — ABNORMAL LOW (ref 6.0–8.3)

## 2016-01-08 LAB — LIPID PANEL
CHOL/HDL RATIO: 2
Cholesterol: 110 mg/dL (ref 0–200)
HDL: 45.4 mg/dL (ref >39.00)
LDL Cholesterol: 55 mg/dL (ref 0–99)
NONHDL: 64.86
TRIGLYCERIDES: 51 mg/dL (ref 0.0–149.0)
VLDL: 10.2 mg/dL (ref 0.0–40.0)

## 2016-01-08 LAB — HEMOGLOBIN A1C: Hgb A1c MFr Bld: 8.1 % — ABNORMAL HIGH (ref 4.6–6.5)

## 2016-01-11 ENCOUNTER — Encounter: Payer: Self-pay | Admitting: Endocrinology

## 2016-01-11 ENCOUNTER — Ambulatory Visit (INDEPENDENT_AMBULATORY_CARE_PROVIDER_SITE_OTHER): Payer: Medicare Other | Admitting: Endocrinology

## 2016-01-11 VITALS — BP 134/82 | HR 63 | Temp 98.2°F | Resp 14 | Ht 67.5 in | Wt 166.2 lb

## 2016-01-11 DIAGNOSIS — Z794 Long term (current) use of insulin: Secondary | ICD-10-CM | POA: Diagnosis not present

## 2016-01-11 DIAGNOSIS — I10 Essential (primary) hypertension: Secondary | ICD-10-CM | POA: Diagnosis not present

## 2016-01-11 DIAGNOSIS — F5104 Psychophysiologic insomnia: Secondary | ICD-10-CM

## 2016-01-11 DIAGNOSIS — G4700 Insomnia, unspecified: Secondary | ICD-10-CM

## 2016-01-11 DIAGNOSIS — E1165 Type 2 diabetes mellitus with hyperglycemia: Secondary | ICD-10-CM

## 2016-01-11 MED ORDER — ESZOPICLONE 2 MG PO TABS: 2.0000 mg | ORAL_TABLET | Freq: Every evening | ORAL | Status: DC | PRN

## 2016-01-11 NOTE — Progress Notes (Signed)
Patient ID: DOIS BILLING, male   DOB: May 02, 1941, 75 y.o.   MRN: [id number]   Reason for Appointment: FOLLOW-up  History of Present Illness   1. Type 2 DIABETES MELITUS, date of diagnosis:  1992        His blood sugar has been  difficult to control over the last few years because of variability in his blood sugars On basal bolus insulin for a few years and his oral hypoglycemic drugs have been continued Also did not tolerate GLP-1 drugs or symlin because of nausea He is essentially looking like a a type I patient with requiring relatively low amounts of insulin and twice a day dosage of basal insulin He has done somewhat better with Levemir compared to Lantus and is using this twice a day also  Recent history:  Insulin regimen: Toujeo insulin 14 units at 8am.  Mealtime insulin: Breakfast = 3-4 units,   supper 6-9  units based on meal size   He still has an A1c over 8% and has difficulty getting consistent control of his blood sugars despite various regimen changes His Toujeo was changed to morning instead of bedtime in 10/16 because of tendency to early morning hypoglycemia periodically  Recent blood sugar patterns and problems:  His blood sugars are mostly fairly good in the mornings and less variable than before but occasionally are still high.  However he is getting up at various times in the morning but checking the blood sugars only when he is eating breakfast late in the morning  He does not adjust his Toujeo that his fasting readings has not shown consistent trends  Most of his blood sugars in the evenings are close to bedtime and not always 2 hours after eating  He has marked variability in his blood sugars at bedtime and not clear why they are high even sometimes 3-4 hours after eating despite usually eating lower fat meals  He will take additional 2-3 units of Humalog  if blood sugars are significantly high at night  Blood sugars in the afternoon not  generally fairly good   Hypoglycemia:  has 3 episodes of morning hypoglycemia between 7-9 AM  with lowest glucose 48  Oral hypoglycemic drugs: Actos, Metformin        Side effects from medications: None       Proper timing of medications in relation to meals: Yes.          Monitors blood glucose: QD    Glucometer: One Touch.          Blood Glucose readings from one touch zoom software download:   Mean values apply above for all meters except median for One Touch  PRE-MEAL Fasting Lunch Dinner Bedtime Overall  Glucose range:  79-181   ?    121-326    Mean/median:  140     161 +/-59     Meals: 2 meals per day.   He is eating less fried and carbohydrates overall and no lunch Has Bfst at 10 am dinner at 7-8 PM    Physical activity: exercise: none      Wt Readings from Last 3 Encounters:  01/11/16 166 lb 3.2 oz (75.388 kg)  10/31/15 165 lb 4.8 oz (74.98 kg)  10/11/15 165 lb 12.8 oz (75.206 kg)         LABS:  Lab Results  Component Value Date   HGBA1C 8.1* 01/08/2016   HGBA1C 8.1* 10/06/2015   HGBA1C 8.3* 07/10/2015   Lab  Results  Component Value Date   MICROALBUR 1.6 03/06/2015   LDLCALC 55 01/08/2016   CREATININE 1.06 01/08/2016    2. Hypertension:    His blood pressure has been high for about 30 years.  He was evaluated in 1/08 with urine metanephrines and renal artery ultrasound.    Usually home blood pressure about 120-130/70    Lab on 01/08/2016  Component Date Value Ref Range Status  . Hgb A1c MFr Bld 01/08/2016 8.1* 4.6 - 6.5 % Final   Glycemic Control Guidelines for People with Diabetes:Non Diabetic:  <6%Goal of Therapy: <7%Additional Action Suggested:  >8%   . Sodium 01/08/2016 141  135 - 145 mEq/L Final  . Potassium 01/08/2016 3.6  3.5 - 5.1 mEq/L Final  . Chloride 01/08/2016 106  96 - 112 mEq/L Final  . CO2 01/08/2016 30  19 - 32 mEq/L Final  . Glucose, Bld 01/08/2016 93  70 - 99 mg/dL Final  . BUN 01/08/2016 16  6 - 23 mg/dL Final  . Creatinine,  Ser 01/08/2016 1.06  0.40 - 1.50 mg/dL Final  . Total Bilirubin 01/08/2016 0.4  0.2 - 1.2 mg/dL Final  . Alkaline Phosphatase 01/08/2016 38* 39 - 117 U/L Final  . AST 01/08/2016 16  0 - 37 U/L Final  . ALT 01/08/2016 14  0 - 53 U/L Final  . Total Protein 01/08/2016 5.7* 6.0 - 8.3 g/dL Final  . Albumin 01/08/2016 3.7  3.5 - 5.2 g/dL Final  . Calcium 01/08/2016 8.8  8.4 - 10.5 mg/dL Final  . GFR 01/08/2016 72.97  >60.00 mL/min Final  . Cholesterol 01/08/2016 110  0 - 200 mg/dL Final   ATP III Classification       Desirable:  < 200 mg/dL               Borderline High:  200 - 239 mg/dL          High:  > = 240 mg/dL  . Triglycerides 01/08/2016 51.0  0.0 - 149.0 mg/dL Final   Normal:  <150 mg/dLBorderline High:  150 - 199 mg/dL  . HDL 01/08/2016 45.40  >39.00 mg/dL Final  . VLDL 01/08/2016 10.2  0.0 - 40.0 mg/dL Final  . LDL Cholesterol 01/08/2016 55  0 - 99 mg/dL Final  . Total CHOL/HDL Ratio 01/08/2016 2   Final                  Men          Women1/2 Average Risk     3.4          3.3Average Risk          5.0          4.42X Average Risk          9.6          7.13X Average Risk          15.0          11.0                      . NonHDL 01/08/2016 64.86   Final   NOTE:  Non-HDL goal should be 30 mg/dL higher than patient's LDL goal (i.e. LDL goal of < 70 mg/dL, would have non-HDL goal of < 100 mg/dL)      Medication List       This list is accurate as of: 01/11/16  2:07 PM.  Always use your most recent med list.  amLODipine 10 MG tablet  Commonly known as:  NORVASC  Take 1 tablet (10 mg total) by mouth daily.     aspirin 81 MG EC tablet  Take 81 mg by mouth daily. Swallow whole.     benazepril 20 MG tablet  Commonly known as:  LOTENSIN  TAKE 1 TABLET BY MOUTH ONCE DAILY     carvedilol 25 MG tablet  Commonly known as:  COREG  TAKE 1 TABLET BY MOUTH TWICE DAILY WITH MEALS     doxazosin 4 MG tablet  Commonly known as:  CARDURA  TAKE 1 TABLET BY MOUTH ATBEDTIME      DUREZOL 0.05 % Emul  Generic drug:  Difluprednate  Place 1 drop into the right eye 2 (two) times daily.     esomeprazole 40 MG capsule  Commonly known as:  NEXIUM  Take 40 mg by mouth daily at 12 noon.     eszopiclone 2 MG Tabs tablet  Commonly known as:  LUNESTA  Take 1 tablet (2 mg total) by mouth at bedtime as needed for sleep. Take immediately before bedtime     FLORASTOR 250 MG capsule  Generic drug:  saccharomyces boulardii  Take 250 mg by mouth 2 (two) times daily.     glucagon 1 MG injection  Commonly known as:  GLUCAGON EMERGENCY  Inject 1 mg into the vein once as needed.     glucose blood test strip  Commonly known as:  ONE TOUCH ULTRA TEST  Use as instructed to check blood sugars three times a day dx code E11.65     HUMALOG KWIKPEN 100 UNIT/ML KiwkPen  Generic drug:  insulin lispro  INJECT 3-9 UNITS AT BREAKFAST 3-4 UNITS AT LUNCH 6-9 UNITS AT DINNER     metFORMIN 750 MG 24 hr tablet  Commonly known as:  GLUCOPHAGE-XR  TAKE 2 TABLETS BY MOUTH AFTER DINNER.     pioglitazone 15 MG tablet  Commonly known as:  ACTOS  TAKE 1 TABLET BY MOUTH DAILY.     rosuvastatin 5 MG tablet  Commonly known as:  CRESTOR  Take 5 mg by mouth every morning.     TOUJEO SOLOSTAR 300 UNIT/ML Sopn  Generic drug:  Insulin Glargine  INJECT 18-19 UNITS INTO SKIN EVERY MORNING     zolpidem 10 MG tablet  Commonly known as:  AMBIEN  Take 1 tablet at bedtime as needed        Allergies:  Allergies  Allergen Reactions  . Glycopyrrolate Rash    Past Medical History  Diagnosis Date  . Diabetes mellitus without complication ([place])   . Hypertension   . Sleep apnea   . Lymphocytosis   . CLL (chronic lymphocytic leukemia) ([place])   . GERD (gastroesophageal reflux disease)   . Hyperlipidemia     Past Surgical History  Procedure Laterality Date  . Hemorrhoid surgery      Sclerotherapy  . Blepharoplasty      Family History  Problem Relation Age of Onset  . Kidney disease Mother    . CAD Maternal Aunt     Social History:  reports that he has never smoked. He does not have any smokeless tobacco history on file. He reports that he drinks alcohol. He reports that he does not use illicit drugs.  Review of Systems:  Review of Systems  Constitutional: Negative for weight loss and malaise/fatigue.  HENT: Negative for hearing loss.   Eyes: Negative for blurred vision.  Respiratory: Negative for cough and shortness of  breath.   Cardiovascular: Negative for chest pain, palpitations, claudication and leg swelling.  Gastrointestinal: Negative for abdominal pain and constipation.  Genitourinary: Negative for urgency and frequency.       Has erectile dysfunction, long-standing and has fair results with using Levitra Rare nocturia, stream normal, no frequency  Musculoskeletal: Negative for myalgias.       Pain in knees present  Neurological: Positive for tingling. Negative for dizziness, sensory change and headaches.       Rare tingling with tight shoes  Psychiatric/Behavioral: Negative for depression and memory loss. The patient has insomnia.        Takes Ambien every night for sleep, has chronic insomnia     HYPERLIPIDEMIA: The lipid abnormality consists of elevated LDL which is very well controlled with low-dose Crestor  Lab Results  Component Value Date   CHOL 110 01/08/2016   HDL 45.40 01/08/2016   LDLCALC 55 01/08/2016   TRIG 51.0 01/08/2016   CHOLHDL 2 01/08/2016    Last colonoscopy was in April 2012 Has not done Hemoccult as directed    He has mild CLL which is followed is by the hematologist and no treatment currently needed recent WBC count is over 50,000 Currently denies any night sweats, weight loss, decreased appetite or weakness Does not have any anemia or thrombocytopenia  Chronic insomnia present and  treated with Ambien daily, he does not find this working adequately and asking for alternative  Diagnosed with sleep apnea (11/09) treated with  CPAP. Sleeps better and less daytime fatigue.    Examination:   BP 134/82 mmHg  Pulse 63  Temp(Src) 98.2 F (36.8 C)  Resp 14  Ht 5' 7.5" (1.715 m)  Wt 166 lb 3.2 oz (75.388 kg)  BMI 25.63 kg/m2  SpO2 97%  Body mass index is 25.63 kg/(m^2).      ASSESSMENT/ PLAN:   Diabetes type 2, inadequately controlled with persistently high A1c over 8% See history of present illness for detailed discussion of his current management, blood sugar patterns and problems identified  Not clear why his A1c status consistently high as his blood sugars are not significantly high at home He has better and more consistent fasting readings with taking Toujeo in the morning indicating 24-hour accident Also blood sugars are usually not high after his first meal  Most likely has high readings after supper and may be getting higher fat meals causing prolonged hyperglycemia at night Since he is afraid of taking more insulin at suppertime will not increase his dose as yet Discussed needing to check more readings 2 hours after eating and adjusting insulin based on post prandial readings Consider carbohydrate counting Discussed timing and targets of blood sugar monitoring More readings on waking up only  HYPERTENSION: Well controlled  Insomnia: Will try him on Lunesta instead of Ambien, he will call for refill if this is better  Preventive care:  He needs to do his Hemoccult and send it in  Patient Instructions  Check more sugars 2 hrs after supper  Send stool test   Counseling time on subjects discussed above is over 50% of today's 25 minute visit   Khalis Hittle 01/11/2016, 2:07 PM

## 2016-01-11 NOTE — Patient Instructions (Signed)
Check more sugars 2 hrs after supper  Send stool test

## 2016-01-15 ENCOUNTER — Other Ambulatory Visit: Payer: Self-pay | Admitting: Endocrinology

## 2016-01-29 ENCOUNTER — Other Ambulatory Visit (HOSPITAL_BASED_OUTPATIENT_CLINIC_OR_DEPARTMENT_OTHER): Payer: Medicare Other

## 2016-01-29 ENCOUNTER — Encounter: Payer: Self-pay | Admitting: Internal Medicine

## 2016-01-29 ENCOUNTER — Telehealth: Payer: Self-pay | Admitting: Internal Medicine

## 2016-01-29 ENCOUNTER — Ambulatory Visit (HOSPITAL_BASED_OUTPATIENT_CLINIC_OR_DEPARTMENT_OTHER): Payer: Medicare Other | Admitting: Internal Medicine

## 2016-01-29 VITALS — BP 138/66 | HR 60 | Temp 98.6°F | Resp 18 | Ht 67.5 in | Wt 165.6 lb

## 2016-01-29 DIAGNOSIS — C9110 Chronic lymphocytic leukemia of B-cell type not having achieved remission: Secondary | ICD-10-CM

## 2016-01-29 DIAGNOSIS — R61 Generalized hyperhidrosis: Secondary | ICD-10-CM

## 2016-01-29 LAB — CBC WITH DIFFERENTIAL/PLATELET
BASO%: 0.2 % (ref 0.0–2.0)
Basophils Absolute: 0.1 10*3/uL (ref 0.0–0.1)
EOS%: 0.6 % (ref 0.0–7.0)
Eosinophils Absolute: 0.3 10*3/uL (ref 0.0–0.5)
HCT: 40.1 % (ref 38.4–49.9)
HEMOGLOBIN: 12.7 g/dL — AB (ref 13.0–17.1)
LYMPH%: 89.3 % — ABNORMAL HIGH (ref 14.0–49.0)
MCH: 29.9 pg (ref 27.2–33.4)
MCHC: 31.7 g/dL — ABNORMAL LOW (ref 32.0–36.0)
MCV: 94.5 fL (ref 79.3–98.0)
MONO#: 0.9 10*3/uL (ref 0.1–0.9)
MONO%: 1.9 % (ref 0.0–14.0)
NEUT%: 8 % — ABNORMAL LOW (ref 39.0–75.0)
NEUTROS ABS: 4 10*3/uL (ref 1.5–6.5)
Platelets: 206 10*3/uL (ref 140–400)
RBC: 4.25 10*6/uL (ref 4.20–5.82)
RDW: 13.5 % (ref 11.0–14.6)
WBC: 50.6 10*3/uL — AB (ref 4.0–10.3)
lymph#: 45.3 10*3/uL — ABNORMAL HIGH (ref 0.9–3.3)

## 2016-01-29 LAB — COMPREHENSIVE METABOLIC PANEL
ALBUMIN: 3.7 g/dL (ref 3.5–5.0)
ALK PHOS: 48 U/L (ref 40–150)
ALT: 12 U/L (ref 0–55)
ANION GAP: 7 meq/L (ref 3–11)
AST: 16 U/L (ref 5–34)
BUN: 18.5 mg/dL (ref 7.0–26.0)
CALCIUM: 9.1 mg/dL (ref 8.4–10.4)
CHLORIDE: 106 meq/L (ref 98–109)
CO2: 28 mEq/L (ref 22–29)
CREATININE: 1.2 mg/dL (ref 0.7–1.3)
EGFR: 62 mL/min/{1.73_m2} — ABNORMAL LOW (ref >90)
Glucose: 149 mg/dl — ABNORMAL HIGH (ref 70–140)
Potassium: 4.7 mEq/L (ref 3.5–5.1)
SODIUM: 141 meq/L (ref 136–145)
Total Bilirubin: 0.39 mg/dL (ref 0.20–1.20)
Total Protein: 6.3 g/dL — ABNORMAL LOW (ref 6.4–8.3)

## 2016-01-29 LAB — TECHNOLOGIST REVIEW

## 2016-01-29 LAB — LACTATE DEHYDROGENASE: LDH: 240 U/L (ref 125–245)

## 2016-01-29 NOTE — Progress Notes (Signed)
[place] [phone]   Fax:(336) [place], [place] [phone]  DIAGNOSIS: Chronic lymphocytic leukemia diagnosed in October 2014.  PRIOR THERAPY: None  CURRENT THERAPY: Observation.  INTERVAL HISTORY: Tony Marshall 75 y.o. male returns to the clinic today for routine three-month follow-up visit accompanied by his wife. The patient is feeling fine today with no specific complaints. He has no significant changes since his last visit 3 months ago. He denied having any significant weight loss but has occasional night sweats. He has no chest pain, shortness of breath, cough or hemoptysis. He has no significant nausea or vomiting. He had repeat CBC performed earlier today and his here for evaluation and discussion of his lab results.  MEDICAL HISTORY: Past Medical History  Diagnosis Date  . Diabetes mellitus without complication ([place])   . Hypertension   . Sleep apnea   . Lymphocytosis   . CLL (chronic lymphocytic leukemia) ([place])   . GERD (gastroesophageal reflux disease)   . Hyperlipidemia     ALLERGIES:  is allergic to glycopyrrolate.  MEDICATIONS:  Current Outpatient Prescriptions  Medication Sig Dispense Refill  . amLODipine (NORVASC) 10 MG tablet Take 1 tablet (10 mg total) by mouth daily. 90 tablet 1  . aspirin 81 MG EC tablet Take 81 mg by mouth daily. Swallow whole.    . benazepril (LOTENSIN) 20 MG tablet TAKE 1 TABLET BY MOUTH ONCE DAILY 90 tablet 0  . carvedilol (COREG) 25 MG tablet TAKE 1 TABLET BY MOUTH TWICE DAILY WITH MEALS 180 tablet 1  . doxazosin (CARDURA) 4 MG tablet TAKE 1 TABLET BY MOUTH ATBEDTIME 90 tablet 1  . DUREZOL 0.05 % EMUL Place 1 drop into the right eye 2 (two) times daily.    Marland Kitchen esomeprazole (NEXIUM) 40 MG capsule Take 40 mg by mouth daily at 12 noon.    . eszopiclone (LUNESTA) 2 MG TABS tablet Take 1 tablet (2 mg total) by mouth at bedtime as  needed for sleep. Take immediately before bedtime 15 tablet 0  . glucagon (GLUCAGON EMERGENCY) 1 MG injection Inject 1 mg into the vein once as needed. 1 each 5  . glucose blood (ONE TOUCH ULTRA TEST) test strip Use as instructed to check blood sugars three times a day dx code E11.65 100 each 5  . HUMALOG KWIKPEN 100 UNIT/ML KiwkPen INJECT 3-9 UNITS AT BREAKFAST 3-4 UNITS AT LUNCH 6-9 UNITS AT DINNER 15 pen 1  . metFORMIN (GLUCOPHAGE-XR) 750 MG 24 hr tablet TAKE 2 TABLETS BY MOUTH AFTER DINNER. 60 tablet 5  . pioglitazone (ACTOS) 15 MG tablet TAKE 1 TABLET BY MOUTH DAILY. 90 tablet 1  . rosuvastatin (CRESTOR) 5 MG tablet Take 5 mg by mouth every morning.    . saccharomyces boulardii (FLORASTOR) 250 MG capsule Take 250 mg by mouth 2 (two) times daily.    . TOUJEO SOLOSTAR 300 UNIT/ML SOPN INJECT 18-19 UNITS INTO SKIN EVERY MORNING (Patient taking differently: INJECT 14 UNITS INTO SKIN EVERY MORNING) 4.5 mL 2  . zolpidem (AMBIEN) 10 MG tablet Take 1 tablet at bedtime as needed 90 tablet 1   No current facility-administered medications for this visit.    SURGICAL HISTORY:  Past Surgical History  Procedure Laterality Date  . Hemorrhoid surgery      Sclerotherapy  . Blepharoplasty      REVIEW OF SYSTEMS:  A comprehensive review of systems was negative.  PHYSICAL EXAMINATION: General appearance: alert, cooperative and no distress Head: Normocephalic, without obvious abnormality, atraumatic Neck: no adenopathy, no JVD, supple, symmetrical, trachea midline and thyroid not enlarged, symmetric, no tenderness/mass/nodules Lymph nodes: Cervical, supraclavicular, and axillary nodes normal. Resp: clear to auscultation bilaterally Back: symmetric, no curvature. ROM normal. No CVA tenderness. Cardio: regular rate and rhythm, S1, S2 normal, no murmur, click, rub or gallop GI: soft, non-tender; bowel sounds normal; no masses,  no organomegaly Extremities: extremities normal, atraumatic, no cyanosis or  edema  ECOG PERFORMANCE STATUS: 0 - Asymptomatic  Blood pressure 138/66, pulse 60, temperature 98.6 F (37 C), temperature source Oral, resp. rate 18, height 5' 7.5" (1.715 m), weight 165 lb 9.6 oz (75.116 kg), SpO2 100 %.  LABORATORY DATA: Lab Results  Component Value Date   WBC 50.6* 01/29/2016   HGB 12.7* 01/29/2016   HCT 40.1 01/29/2016   MCV 94.5 01/29/2016   PLT 206 01/29/2016      Chemistry      Component Value Date/Time   NA 141 01/08/2016 1058   NA 140 10/31/2015 1258   K 3.6 01/08/2016 1058   K 4.1 10/31/2015 1258   CL 106 01/08/2016 1058   CO2 30 01/08/2016 1058   CO2 26 10/31/2015 1258   BUN 16 01/08/2016 1058   BUN 16.8 10/31/2015 1258   CREATININE 1.06 01/08/2016 1058   CREATININE 1.0 10/31/2015 1258      Component Value Date/Time   CALCIUM 8.8 01/08/2016 1058   CALCIUM 9.2 10/31/2015 1258   ALKPHOS 38* 01/08/2016 1058   ALKPHOS 44 10/31/2015 1258   AST 16 01/08/2016 1058   AST 15 10/31/2015 1258   ALT 14 01/08/2016 1058   ALT 14 10/31/2015 1258   BILITOT 0.4 01/08/2016 1058   BILITOT 0.33 10/31/2015 1258       RADIOGRAPHIC STUDIES: No results found.  ASSESSMENT AND PLAN: This is a very pleasant 75 years old white male with history of chronic lymphocytic leukemia currently on observation. His CBC today showed stabl total white blood count compared to 3 months ago. The patient is currently asymptomatic.  I discussed the lab result with the patient and his wife. I recommended for him to continue on observation with repeat CBC, comprehensive metabolic panel and LDH in 3 months. He was advised to call immediately if he has any concerning symptoms in the interval. The patient voices understanding of current disease status and treatment options and is in agreement with the current care plan.  All questions were answered. The patient knows to call the clinic with any problems, questions or concerns. We can certainly see the patient much sooner if  necessary.  Disclaimer: This note was dictated with voice recognition software. Similar sounding words can inadvertently be transcribed and may not be corrected upon review.

## 2016-01-29 NOTE — Telephone Encounter (Signed)
per pfo to sch pt appt-gave pt copy of avs °

## 2016-02-14 ENCOUNTER — Encounter: Payer: Self-pay | Admitting: Internal Medicine

## 2016-02-21 ENCOUNTER — Other Ambulatory Visit: Payer: Self-pay | Admitting: Endocrinology

## 2016-03-08 ENCOUNTER — Other Ambulatory Visit: Payer: Self-pay | Admitting: Endocrinology

## 2016-03-18 ENCOUNTER — Other Ambulatory Visit: Payer: Self-pay | Admitting: Endocrinology

## 2016-04-05 ENCOUNTER — Other Ambulatory Visit (INDEPENDENT_AMBULATORY_CARE_PROVIDER_SITE_OTHER): Payer: Medicare Other

## 2016-04-05 DIAGNOSIS — E1165 Type 2 diabetes mellitus with hyperglycemia: Secondary | ICD-10-CM

## 2016-04-05 DIAGNOSIS — Z794 Long term (current) use of insulin: Secondary | ICD-10-CM

## 2016-04-05 LAB — COMPREHENSIVE METABOLIC PANEL
ALK PHOS: 42 U/L (ref 39–117)
ALT: 13 U/L (ref 0–53)
AST: 16 U/L (ref 0–37)
Albumin: 3.7 g/dL (ref 3.5–5.2)
BILIRUBIN TOTAL: 0.3 mg/dL (ref 0.2–1.2)
BUN: 17 mg/dL (ref 6–23)
CALCIUM: 9.2 mg/dL (ref 8.4–10.5)
CO2: 31 mEq/L (ref 19–32)
Chloride: 104 mEq/L (ref 96–112)
Creatinine, Ser: 1.12 mg/dL (ref 0.40–1.50)
GFR: 68.43 mL/min (ref >60.00)
Glucose, Bld: 141 mg/dL — ABNORMAL HIGH (ref 70–99)
Potassium: 3.6 mEq/L (ref 3.5–5.1)
Sodium: 143 mEq/L (ref 135–145)
TOTAL PROTEIN: 6 g/dL (ref 6.0–8.3)

## 2016-04-05 LAB — MICROALBUMIN / CREATININE URINE RATIO
CREATININE, U: 209.2 mg/dL
MICROALB/CREAT RATIO: 1.7 mg/g (ref 0.0–30.0)
Microalb, Ur: 3.5 mg/dL — ABNORMAL HIGH (ref 0.0–1.9)

## 2016-04-05 LAB — HEMOGLOBIN A1C: Hgb A1c MFr Bld: 7.8 % — ABNORMAL HIGH (ref 4.6–6.5)

## 2016-04-08 ENCOUNTER — Telehealth: Payer: Self-pay | Admitting: Internal Medicine

## 2016-04-08 NOTE — Telephone Encounter (Signed)
pt called to resched 5/16 appt to 5/24

## 2016-04-10 ENCOUNTER — Encounter: Payer: Self-pay | Admitting: Endocrinology

## 2016-04-10 ENCOUNTER — Ambulatory Visit (INDEPENDENT_AMBULATORY_CARE_PROVIDER_SITE_OTHER): Payer: Medicare Other | Admitting: Endocrinology

## 2016-04-10 ENCOUNTER — Other Ambulatory Visit: Payer: Self-pay | Admitting: *Deleted

## 2016-04-10 VITALS — BP 126/72 | HR 67 | Temp 98.0°F | Resp 14 | Ht 67.75 in | Wt 164.8 lb

## 2016-04-10 DIAGNOSIS — E1165 Type 2 diabetes mellitus with hyperglycemia: Secondary | ICD-10-CM | POA: Diagnosis not present

## 2016-04-10 DIAGNOSIS — Z794 Long term (current) use of insulin: Secondary | ICD-10-CM

## 2016-04-10 MED ORDER — ZOLPIDEM TARTRATE ER 12.5 MG PO TBCR: 12.5000 mg | EXTENDED_RELEASE_TABLET | Freq: Every evening | ORAL | Status: DC | PRN

## 2016-04-10 MED ORDER — CANAGLIFLOZIN 100 MG PO TABS: ORAL_TABLET | ORAL | Status: DC

## 2016-04-10 NOTE — Patient Instructions (Addendum)
Invokana in am   Amlodipine 1/2 daily  Reduce Toujeo to 12  May need more insulin at supper

## 2016-04-10 NOTE — Progress Notes (Signed)
Patient ID: Tony Marshall, male   DOB: 1941/03/05, 75 y.o.   MRN: [id number]   Reason for Appointment: FOLLOW-up  History of Present Illness   1. Type 2 DIABETES MELITUS, date of diagnosis:  1992        His blood sugar has been  difficult to control over the last few years because of variability in his blood sugars On basal bolus insulin for a few years and his oral hypoglycemic drugs have been continued Also did not tolerate GLP-1 drugs or symlin because of nausea He is essentially looking like a a type I patient with requiring relatively low amounts of insulin and twice a day dosage of basal insulin He has done somewhat better with Levemir compared to Lantus and is using this twice a day also  Recent history:  Insulin regimen: Toujeo insulin 14 units at 8am.  Mealtime insulin: Breakfast = 3-4 units,   supper 6-9  units based on meal size   He still has high blood sugars overall especially recently although his A1c is slightly better at 7.8, previously usually over 8% His Toujeo was changed to morning instead of bedtime in 10/16 because of tendency to early morning hypoglycemia periodically  Recent blood sugar patterns and problems:  His blood sugars are variable in the mornings although not consistently high; occasionally will take extra Humalog insulin at bedtime when postprandial reading is high  Blood sugars are mildly increased late morning but not checking very much  Usually blood sugars are better in the afternoons  Blood sugars are mostly rising after evening meal  He is trying to adjust his suppertime coverage based on his meal size but because of fear of hypoglycemia does not take significant amount of insulin  Last night had a very high carbohydrate meals with no protein and glucose was 314.  He then took 5 units of Humalog  He has checked his sugars more consistently now   Hypoglycemia:  has occasional low blood sugars in the afternoon, at least once  after taking extra insulin for a high blood sugar Minimal hypoglycemia overnight lowest reading 61  Oral hypoglycemic drugs: Actos, Metformin        Side effects from medications: None       Proper timing of medications in relation to meals: Yes.          Monitors blood glucose: QD    Glucometer: One Touch.          Blood Glucose readings from one touch zoom software download:   Mean values apply above for all meters except median for One Touch  PRE-MEAL Fasting Lunch Dinner Bedtime Overall  Glucose range: 98-209  130-278  65-262  83-314    Mean/median: 150    230  194   POST-MEAL PC Breakfast PC Lunch PC Dinner  Glucose range:  47-234    Mean/median:  115       Mean values apply above for all meters except median for One Touch  PRE-MEAL Fasting Lunch Dinner Bedtime Overall  Glucose range:  79-181   ?    121-326    Mean/median:  140     161 +/-59     Meals: 2 meals per day.   He is eating less friedFood and carbohydrates overall and no formal lunch Has Bfst at 10 am dinner at 7-8 PM     Physical activity: exercise:  walking in pm     Wt Readings from Last 3 Encounters:  04/10/16 164 lb 12.8 oz (74.753 kg)  01/29/16 165 lb 9.6 oz (75.116 kg)  01/11/16 166 lb 3.2 oz (75.388 kg)         LABS:  Lab Results  Component Value Date   HGBA1C 7.8* 04/05/2016   HGBA1C 8.1* 01/08/2016   HGBA1C 8.1* 10/06/2015   Lab Results  Component Value Date   MICROALBUR 3.5* 04/05/2016   LDLCALC 55 01/08/2016   CREATININE 1.12 04/05/2016    2. Hypertension:    His blood pressure has been high for about 30 years.  He was evaluated in 1/08 with urine metanephrines and renal artery ultrasound.    Usually home blood pressure about 120-130/70    Lab on 04/05/2016  Component Date Value Ref Range Status  . Hgb A1c MFr Bld 04/05/2016 7.8* 4.6 - 6.5 % Final   Glycemic Control Guidelines for People with Diabetes:Non Diabetic:  <6%Goal of Therapy: <7%Additional Action Suggested:  >8%     . Sodium 04/05/2016 143  135 - 145 mEq/L Final  . Potassium 04/05/2016 3.6  3.5 - 5.1 mEq/L Final  . Chloride 04/05/2016 104  96 - 112 mEq/L Final  . CO2 04/05/2016 31  19 - 32 mEq/L Final  . Glucose, Bld 04/05/2016 141* 70 - 99 mg/dL Final  . BUN 04/05/2016 17  6 - 23 mg/dL Final  . Creatinine, Ser 04/05/2016 1.12  0.40 - 1.50 mg/dL Final  . Total Bilirubin 04/05/2016 0.3  0.2 - 1.2 mg/dL Final  . Alkaline Phosphatase 04/05/2016 42  39 - 117 U/L Final  . AST 04/05/2016 16  0 - 37 U/L Final  . ALT 04/05/2016 13  0 - 53 U/L Final  . Total Protein 04/05/2016 6.0  6.0 - 8.3 g/dL Final  . Albumin 04/05/2016 3.7  3.5 - 5.2 g/dL Final  . Calcium 04/05/2016 9.2  8.4 - 10.5 mg/dL Final  . GFR 04/05/2016 68.43  >60.00 mL/min Final  . Microalb, Ur 04/05/2016 3.5* 0.0 - 1.9 mg/dL Final  . Creatinine,U 04/05/2016 209.2   Final  . Microalb Creat Ratio 04/05/2016 1.7  0.0 - 30.0 mg/g Final      Medication List       This list is accurate as of: 04/10/16  9:57 PM.  Always use your most recent med list.               amLODipine 10 MG tablet  Commonly known as:  NORVASC  Take 1 tablet (10 mg total) by mouth daily.     aspirin 81 MG EC tablet  Take 81 mg by mouth daily. Swallow whole.     benazepril 20 MG tablet  Commonly known as:  LOTENSIN  TAKE 1 TABLET BY MOUTH ONCE DAILY     canagliflozin 100 MG Tabs tablet  Commonly known as:  INVOKANA  1 tablet before breakfast     carvedilol 25 MG tablet  Commonly known as:  COREG  TAKE 1 TABLET BY MOUTH TWICE DAILY WITH MEALS     doxazosin 4 MG tablet  Commonly known as:  CARDURA  TAKE 1 TABLET BY MOUTH ATBEDTIME     DUREZOL 0.05 % Emul  Generic drug:  Difluprednate  Place 1 drop into the right eye 2 (two) times daily.     esomeprazole 40 MG capsule  Commonly known as:  NEXIUM  Take 40 mg by mouth daily at 12 noon.     FLORASTOR 250 MG capsule  Generic drug:  saccharomyces boulardii  Take 250  mg by mouth 2 (two) times daily.      glucagon 1 MG injection  Commonly known as:  GLUCAGON EMERGENCY  Inject 1 mg into the vein once as needed.     HUMALOG KWIKPEN 100 UNIT/ML KiwkPen  Generic drug:  insulin lispro  INJECT 3-9 UNITS AT BREAKFAST 3-4 UNITS AT LUNCH 6-9 UNITS AT DINNER     metFORMIN 750 MG 24 hr tablet  Commonly known as:  GLUCOPHAGE-XR  TAKE 2 TABLETS BY MOUTH AFTER DINNER.     ONE TOUCH ULTRA TEST test strip  Generic drug:  glucose blood  USE ONE STRIP TO CHECK GLUCOSE THREE TIMES DAILY     pioglitazone 15 MG tablet  Commonly known as:  ACTOS  TAKE 1 TABLET BY MOUTH DAILY.     rosuvastatin 5 MG tablet  Commonly known as:  CRESTOR  Take 5 mg by mouth every morning.     TOUJEO SOLOSTAR 300 UNIT/ML Sopn  Generic drug:  Insulin Glargine  INJECT 18-19 UNITS INTO SKIN EVERY MORNING     zolpidem 12.5 MG CR tablet  Commonly known as:  AMBIEN CR  Take 1 tablet (12.5 mg total) by mouth at bedtime as needed for sleep.        Allergies:  Allergies  Allergen Reactions  . Glycopyrrolate Rash    Past Medical History  Diagnosis Date  . Diabetes mellitus without complication ([place])   . Hypertension   . Sleep apnea   . Lymphocytosis   . CLL (chronic lymphocytic leukemia) ([place])   . GERD (gastroesophageal reflux disease)   . Hyperlipidemia     Past Surgical History  Procedure Laterality Date  . Hemorrhoid surgery      Sclerotherapy  . Blepharoplasty      Family History  Problem Relation Age of Onset  . Kidney disease Mother   . CAD Maternal Aunt     Social History:  reports that he has never smoked. He does not have any smokeless tobacco history on file. He reports that he drinks alcohol. He reports that he does not use illicit drugs.  Review of Systems:  ROS   HYPERLIPIDEMIA: The lipid abnormality consists of elevated LDL which is very well controlled with low-dose Crestor  Lab Results  Component Value Date   CHOL 110 01/08/2016   HDL 45.40 01/08/2016   LDLCALC 55 01/08/2016    TRIG 51.0 01/08/2016   CHOLHDL 2 01/08/2016    Last colonoscopy was in April 2012.  No results found for: OCCULTBLD    He has mild CLL which is followed is by the hematologist and no treatment currently needed recent WBC count is over 50,000 Currently denies any night sweats, weight loss, decreased appetite or weakness However continues to have fatigue Does not have any anemia or thrombocytopenia  Chronic insomnia present and  treated with Ambien daily, he Says this makes him groggy after taking it but he then has late insomnia him times  Diagnosed with sleep apnea (11/09) treated with CPAP. Sleeps better and less daytime fatigue.    Examination:   BP 126/72 mmHg  Pulse 67  Temp(Src) 98 F (36.7 C)  Resp 14  Ht 5' 7.75" (1.721 m)  Wt 164 lb 12.8 oz (74.753 kg)  BMI 25.24 kg/m2  SpO2 97%  Body mass index is 25.24 kg/(m^2).      ASSESSMENT/ PLAN:   Diabetes type 2, inadequately controlled with generally high A1c over 8% See history of present illness for detailed discussion of  his current management, blood sugar patterns and problems identified  He is having his highest readings after evening meal and this is related to his taking adequate insulin for his coverage for fear of hypoglycemia Some of his high resting reading may be high from carryover from the night before but has several good readings in the mornings also  Since he is not able to change his insulin doses are count carbohydrates on his own will give him a trial of Invokana This may help his overall control Discussed action of SGLT 2 drugs on lowering glucose by decreasing kidney absorption of glucose, benefits of weight loss and lower blood pressure, possible side effects including candidiasis and dosage regimen   Also consider continuous glucose monitoring on the next visit needed  HYPERTENSION: Well controlled He will reduce his amlodipine to half tablet while starting Invokana  Insomnia: Will try him  on Ambien CR    Patient Instructions  Invokana in am   Amlodipine 1/2 daily  Reduce Toujeo to 12  May need more insulin at supper       Counseling time on subjects discussed above is over 50% of today's 25 minute visit   Valorie Mcgrory 04/10/2016, 9:57 PM

## 2016-04-12 ENCOUNTER — Other Ambulatory Visit: Payer: Self-pay | Admitting: Endocrinology

## 2016-04-15 ENCOUNTER — Other Ambulatory Visit: Payer: Self-pay | Admitting: Endocrinology

## 2016-04-16 ENCOUNTER — Other Ambulatory Visit: Payer: Self-pay | Admitting: *Deleted

## 2016-04-16 MED ORDER — SILDENAFIL CITRATE 20 MG PO TABS: ORAL_TABLET | ORAL | Status: DC

## 2016-04-30 ENCOUNTER — Ambulatory Visit: Payer: Medicare Other | Admitting: Internal Medicine

## 2016-04-30 ENCOUNTER — Other Ambulatory Visit: Payer: Medicare Other

## 2016-05-08 ENCOUNTER — Other Ambulatory Visit (HOSPITAL_BASED_OUTPATIENT_CLINIC_OR_DEPARTMENT_OTHER): Payer: Medicare Other

## 2016-05-08 ENCOUNTER — Ambulatory Visit (HOSPITAL_BASED_OUTPATIENT_CLINIC_OR_DEPARTMENT_OTHER): Payer: Medicare Other | Admitting: Internal Medicine

## 2016-05-08 ENCOUNTER — Other Ambulatory Visit: Payer: Self-pay | Admitting: Endocrinology

## 2016-05-08 ENCOUNTER — Telehealth: Payer: Self-pay | Admitting: Internal Medicine

## 2016-05-08 ENCOUNTER — Encounter: Payer: Self-pay | Admitting: Internal Medicine

## 2016-05-08 VITALS — BP 156/76 | HR 62 | Temp 98.2°F | Resp 18 | Ht 67.0 in | Wt 163.7 lb

## 2016-05-08 DIAGNOSIS — C9110 Chronic lymphocytic leukemia of B-cell type not having achieved remission: Secondary | ICD-10-CM

## 2016-05-08 LAB — COMPREHENSIVE METABOLIC PANEL
ALBUMIN: 3.7 g/dL (ref 3.5–5.0)
ALK PHOS: 49 U/L (ref 40–150)
ALT: 18 U/L (ref 0–55)
AST: 18 U/L (ref 5–34)
Anion Gap: 9 mEq/L (ref 3–11)
BUN: 20.1 mg/dL (ref 7.0–26.0)
CO2: 24 meq/L (ref 22–29)
Calcium: 8.7 mg/dL (ref 8.4–10.4)
Chloride: 108 mEq/L (ref 98–109)
Creatinine: 1.3 mg/dL (ref 0.7–1.3)
EGFR: 53 mL/min/{1.73_m2} — ABNORMAL LOW (ref >90)
GLUCOSE: 249 mg/dL — AB (ref 70–140)
POTASSIUM: 4.3 meq/L (ref 3.5–5.1)
SODIUM: 141 meq/L (ref 136–145)
Total Bilirubin: 0.34 mg/dL (ref 0.20–1.20)
Total Protein: 6.2 g/dL — ABNORMAL LOW (ref 6.4–8.3)

## 2016-05-08 LAB — CBC WITH DIFFERENTIAL/PLATELET
BASO%: 0.3 % (ref 0.0–2.0)
BASOS ABS: 0.2 10*3/uL — AB (ref 0.0–0.1)
EOS%: 0.5 % (ref 0.0–7.0)
Eosinophils Absolute: 0.3 10*3/uL (ref 0.0–0.5)
HCT: 40.3 % (ref 38.4–49.9)
HEMOGLOBIN: 12.9 g/dL — AB (ref 13.0–17.1)
LYMPH%: 89.7 % — ABNORMAL HIGH (ref 14.0–49.0)
MCH: 30.1 pg (ref 27.2–33.4)
MCHC: 32.1 g/dL (ref 32.0–36.0)
MCV: 93.9 fL (ref 79.3–98.0)
MONO#: 1.1 10*3/uL — ABNORMAL HIGH (ref 0.1–0.9)
MONO%: 1.7 % (ref 0.0–14.0)
NEUT%: 7.8 % — ABNORMAL LOW (ref 39.0–75.0)
NEUTROS ABS: 5 10*3/uL (ref 1.5–6.5)
Platelets: 202 10*3/uL (ref 140–400)
RBC: 4.29 10*6/uL (ref 4.20–5.82)
RDW: 15.4 % — AB (ref 11.0–14.6)
WBC: 63.8 10*3/uL — AB (ref 4.0–10.3)
lymph#: 57.2 10*3/uL — ABNORMAL HIGH (ref 0.9–3.3)

## 2016-05-08 LAB — LACTATE DEHYDROGENASE: LDH: 219 U/L (ref 125–245)

## 2016-05-08 LAB — TECHNOLOGIST REVIEW

## 2016-05-08 NOTE — Telephone Encounter (Signed)
per pof to sch pt appt-gave pt copy of avs °

## 2016-05-08 NOTE — Progress Notes (Signed)
[facility] [phone]   [phone]) [place], [place] [phone]  DIAGNOSIS: Chronic lymphocytic leukemia diagnosed in October 2014.  PRIOR THERAPY: None  CURRENT THERAPY: Observation.  INTERVAL HISTORY: Tony Marshall 75 y.o. male returns to the clinic today for routine three-month follow-up visit accompanied by his wife. The patient is feeling fine today with no specific complaints. He denied having any significant weight loss but has occasional night sweats. He has no palpable lymphadenopathy. He has no chest pain, shortness of breath, cough or hemoptysis. He has no significant nausea or vomiting. He had repeat CBC performed earlier today and his here for evaluation and discussion of his lab results.  MEDICAL HISTORY: Past Medical History  Diagnosis Date  . Diabetes mellitus without complication ([place])   . Hypertension   . Sleep apnea   . Lymphocytosis   . CLL (chronic lymphocytic leukemia) ([place])   . GERD (gastroesophageal reflux disease)   . Hyperlipidemia     ALLERGIES:  is allergic to glycopyrrolate.  MEDICATIONS:  Current Outpatient Prescriptions  Medication Sig Dispense Refill  . amLODipine (NORVASC) 10 MG tablet Take 1 tablet (10 mg total) by mouth daily. 90 tablet 1  . aspirin 81 MG EC tablet Take 81 mg by mouth daily. Swallow whole.    . benazepril (LOTENSIN) 20 MG tablet TAKE 1 TABLET BY MOUTH ONCE DAILY 90 tablet 1  . canagliflozin (INVOKANA) 100 MG TABS tablet 1 tablet before breakfast 30 tablet 3  . carvedilol (COREG) 25 MG tablet TAKE 1 TABLET BY MOUTH TWICE DAILY WITH MEALS 180 tablet 1  . doxazosin (CARDURA) 4 MG tablet TAKE 1 TABLET BY MOUTH ATBEDTIME 90 tablet 1  . DUREZOL 0.05 % EMUL Place 1 drop into the right eye 2 (two) times daily.    Marland Kitchen esomeprazole (NEXIUM) 40 MG capsule Take 40 mg by mouth daily at 12 noon.    Marland Kitchen glucagon (GLUCAGON EMERGENCY) 1 MG  injection Inject 1 mg into the vein once as needed. 1 each 5  . HUMALOG KWIKPEN 100 UNIT/ML KiwkPen INJECT 3-9 UNITS AT BREAKFAST 3-4 UNITS AT LUNCH 6-9 UNITS AT DINNER (Patient taking differently: INJECT 3-9 UNITS AT BREAKFAST 3-4 UNITS AT LUNCH 6-9 UNITS AT DINNER depending on what he eats) 15 pen 1  . metFORMIN (GLUCOPHAGE-XR) 750 MG 24 hr tablet TAKE 2 TABLETS BY MOUTH AFTER DINNER. 60 tablet 5  . metFORMIN (GLUCOPHAGE-XR) 750 MG 24 hr tablet TAKE 2 TABLETS BY MOUTH AFTER DINNER 180 tablet 1  . ONE TOUCH ULTRA TEST test strip USE ONE STRIP TO CHECK GLUCOSE THREE TIMES DAILY 100 each 3  . pioglitazone (ACTOS) 15 MG tablet TAKE 1 TABLET BY MOUTH DAILY. 90 tablet 1  . rosuvastatin (CRESTOR) 5 MG tablet Take 5 mg by mouth every morning.    . saccharomyces boulardii (FLORASTOR) 250 MG capsule Take 250 mg by mouth 2 (two) times daily.    . sildenafil (REVATIO) 20 MG tablet TAKE 5 TABLETS BY MOUTH AS NEEDED once a week. 30 tablet 2  . TOUJEO SOLOSTAR 300 UNIT/ML SOPN INJECT 18-19 UNITS INTO SKIN EVERY MORNING (Patient taking differently: INJECT 15 UNITS INTO SKIN EVERY MORNING) 4.5 mL 2  . zolpidem (AMBIEN CR) 12.5 MG CR tablet Take 1 tablet (12.5 mg total) by mouth at bedtime as needed for sleep. 30 tablet 3   No current facility-administered medications for this visit.  SURGICAL HISTORY:  Past Surgical History  Procedure Laterality Date  . Hemorrhoid surgery      Sclerotherapy  . Blepharoplasty      REVIEW OF SYSTEMS:  A comprehensive review of systems was negative except for: Constitutional: positive for night sweats   PHYSICAL EXAMINATION: General appearance: alert, cooperative and no distress Head: Normocephalic, without obvious abnormality, atraumatic Neck: no adenopathy, no JVD, supple, symmetrical, trachea midline and thyroid not enlarged, symmetric, no tenderness/mass/nodules Lymph nodes: Cervical, supraclavicular, and axillary nodes normal. Resp: clear to auscultation  bilaterally Back: symmetric, no curvature. ROM normal. No CVA tenderness. Cardio: regular rate and rhythm, S1, S2 normal, no murmur, click, rub or gallop GI: soft, non-tender; bowel sounds normal; no masses,  no organomegaly Extremities: extremities normal, atraumatic, no cyanosis or edema  ECOG PERFORMANCE STATUS: 0 - Asymptomatic  Blood pressure 156/76, pulse 62, temperature 98.2 F (36.8 C), temperature source Oral, resp. rate 18, height 5\' 7"  (1.702 m), weight 163 lb 11.2 oz (74.254 kg), SpO2 99 %.  LABORATORY DATA: Lab Results  Component Value Date   WBC 63.8* 05/08/2016   HGB 12.9* 05/08/2016   HCT 40.3 05/08/2016   MCV 93.9 05/08/2016   PLT 202 05/08/2016      Chemistry      Component Value Date/Time   NA 143 04/05/2016 1050   NA 141 01/29/2016 1304   K 3.6 04/05/2016 1050   K 4.7 01/29/2016 1304   CL 104 04/05/2016 1050   CO2 31 04/05/2016 1050   CO2 28 01/29/2016 1304   BUN 17 04/05/2016 1050   BUN 18.5 01/29/2016 1304   CREATININE 1.12 04/05/2016 1050   CREATININE 1.2 01/29/2016 1304      Component Value Date/Time   CALCIUM 9.2 04/05/2016 1050   CALCIUM 9.1 01/29/2016 1304   ALKPHOS 42 04/05/2016 1050   ALKPHOS 48 01/29/2016 1304   AST 16 04/05/2016 1050   AST 16 01/29/2016 1304   ALT 13 04/05/2016 1050   ALT 12 01/29/2016 1304   BILITOT 0.3 04/05/2016 1050   BILITOT 0.39 01/29/2016 1304       RADIOGRAPHIC STUDIES: No results found.  ASSESSMENT AND PLAN: This is a very pleasant 75 years old white male with history of chronic lymphocytic leukemia currently on observation. His CBC today showed further increase in the total white blood count. The patient is currently asymptomatic.  I discussed the lab result with the patient and his wife. I recommended for him to continue on observation with repeat CBC, comprehensive metabolic panel and LDH in 3 months. He was advised to call immediately if he has any concerning symptoms in the interval. The patient  voices understanding of current disease status and treatment options and is in agreement with the current care plan.  All questions were answered. The patient knows to call the clinic with any problems, questions or concerns. We can certainly see the patient much sooner if necessary.  Disclaimer: This note was dictated with voice recognition software. Similar sounding words can inadvertently be transcribed and may not be corrected upon review.

## 2016-05-14 ENCOUNTER — Other Ambulatory Visit (INDEPENDENT_AMBULATORY_CARE_PROVIDER_SITE_OTHER): Payer: Medicare Other

## 2016-05-14 DIAGNOSIS — Z794 Long term (current) use of insulin: Secondary | ICD-10-CM | POA: Diagnosis not present

## 2016-05-14 DIAGNOSIS — E1165 Type 2 diabetes mellitus with hyperglycemia: Secondary | ICD-10-CM

## 2016-05-14 LAB — BASIC METABOLIC PANEL
BUN: 23 mg/dL (ref 6–23)
CALCIUM: 9.3 mg/dL (ref 8.4–10.5)
CO2: 28 mEq/L (ref 19–32)
Chloride: 107 mEq/L (ref 96–112)
Creatinine, Ser: 1.19 mg/dL (ref 0.40–1.50)
GFR: 63.78 mL/min (ref >60.00)
Glucose, Bld: 99 mg/dL (ref 70–99)
POTASSIUM: 4.2 meq/L (ref 3.5–5.1)
SODIUM: 140 meq/L (ref 135–145)

## 2016-05-15 LAB — FRUCTOSAMINE: FRUCTOSAMINE: 293 umol/L — AB (ref 0–285)

## 2016-05-17 ENCOUNTER — Ambulatory Visit (INDEPENDENT_AMBULATORY_CARE_PROVIDER_SITE_OTHER): Payer: Medicare Other | Admitting: Endocrinology

## 2016-05-17 ENCOUNTER — Encounter: Payer: Self-pay | Admitting: Endocrinology

## 2016-05-17 VITALS — BP 130/82 | HR 61 | Temp 98.2°F | Resp 16 | Ht 67.0 in | Wt 161.4 lb

## 2016-05-17 DIAGNOSIS — E1165 Type 2 diabetes mellitus with hyperglycemia: Secondary | ICD-10-CM | POA: Diagnosis not present

## 2016-05-17 DIAGNOSIS — Z794 Long term (current) use of insulin: Secondary | ICD-10-CM | POA: Diagnosis not present

## 2016-05-17 NOTE — Patient Instructions (Signed)
May leave off Metformin    Check blood sugars on waking up 4  times a week Also check blood sugars about 2 hours after a meal and do this after different meals by rotation  Recommended blood sugar levels on waking up is 90-130 and about 2 hours after meal is 130-160  Please bring your blood sugar monitor to each visit, thank you  Check BP  ? 13 Toujeo if am sugar high

## 2016-05-17 NOTE — Progress Notes (Signed)
Patient ID: Tony Marshall, male   DOB: December 19, 1940, 75 y.o.   MRN: [id number]   Reason for Appointment: FOLLOW-up  History of Present Illness   1. Type 2 DIABETES MELITUS, date of diagnosis:  1992        His blood sugar has been  difficult to control over the last few years because of variability in his blood sugars On basal bolus insulin for a few years and his oral hypoglycemic drugs have been continued Also did not tolerate GLP-1 drugs or symlin because of nausea He is essentially looking like a a type I patient with requiring relatively low amounts of insulin and twice a day dosage of basal insulin He has done somewhat better with Levemir compared to Lantus and is using this twice a day also  Recent history:  Insulin regimen: Toujeo insulin 12 units at 8am.  Mealtime insulin: Breakfast = 3-4 units,  supper 6-10  units based on meal size   He still has high blood sugars overall especially recently although his A1c is slightly better at 7.8, previously usually over 8% His Toujeo was changed to morning instead of bedtime in 10/16 because of tendency to early morning hypoglycemia periodically  He was started on Invokana in 4/17 and his blood sugars appear to be significantly better with this Fructosamine is 293  Recent blood sugar patterns and problems:  His blood sugars are less variable in the mornings and only has occasional high readings; however checking less frequently than before.  As a precaution Toujeo was reduced by 2 units on the last visit when he started Invokana  He has sporadic high or low normal readings during the day and not clear which readings are after meals  He has only a couple of readings over 200 after his evening meal and use her fluctuating less than before  Highest reading was 304 after supper about 3-4 weeks ago.  With taking extra insulin at this point is sugar went low at night  His weight has come down about 2 pounds   Hypoglycemia:   has occasional low blood sugars in the afternoon, at least once after taking extra insulin for a high blood sugar Minimal hypoglycemia overnight lowest reading 61  Oral hypoglycemic drugs: Actos, Metformin        Side effects from medications: None       Proper timing of medications in relation to meals: Yes.          Monitors blood glucose: QD    Glucometer: One Touch.          Blood Glucose readings from one touch zoom software download:   Mean values apply above for all meters except median for One Touch  PRE-MEAL Fasting Lunch Dinner Bedtime Overall  Glucose range: 98-178  63, 284  57, 141  61-304    Mean/median: 145    150  142   Meals: 2 meals per day, Usually late breakfast and late evening meal  He is eating less High-fat foods and carbohydrates overall Has Bfst at 10 am dinner at 7-8 PM     Physical activity: exercise:  walking in pm, golf     Wt Readings from Last 3 Encounters:  05/17/16 161 lb 6.4 oz (73.211 kg)  05/08/16 163 lb 11.2 oz (74.254 kg)  04/10/16 164 lb 12.8 oz (74.753 kg)         LABS:  Lab Results  Component Value Date   HGBA1C 7.8* 04/05/2016  HGBA1C 8.1* 01/08/2016   HGBA1C 8.1* 10/06/2015   Lab Results  Component Value Date   MICROALBUR 3.5* 04/05/2016   LDLCALC 55 01/08/2016   CREATININE 1.19 05/14/2016    2. Hypertension:    His blood pressure has been high for about 30 years.  He was evaluated in 1/08 with urine metanephrines and renal artery ultrasound.    Usually home blood pressure about 120-130/70  Because of starting Invokana his amlodipine was reduced to half a tablet     Lab on 05/14/2016  Component Date Value Ref Range Status  . Fructosamine 05/14/2016 293* 0 - 285 umol/L Final   Comment: Published reference interval for apparently healthy subjects between age 26 and 75 is 59 - 285 umol/L and in a poorly controlled diabetic population is 228 - 563 umol/L with a mean of 396 umol/L.   Marland Kitchen Sodium 05/14/2016 140  135 -  145 mEq/L Final  . Potassium 05/14/2016 4.2  3.5 - 5.1 mEq/L Final  . Chloride 05/14/2016 107  96 - 112 mEq/L Final  . CO2 05/14/2016 28  19 - 32 mEq/L Final  . Glucose, Bld 05/14/2016 99  70 - 99 mg/dL Final  . BUN 05/14/2016 23  6 - 23 mg/dL Final  . Creatinine, Ser 05/14/2016 1.19  0.40 - 1.50 mg/dL Final  . Calcium 05/14/2016 9.3  8.4 - 10.5 mg/dL Final  . GFR 05/14/2016 63.78  >60.00 mL/min Final      Medication List       This list is accurate as of: 05/17/16  3:47 PM.  Always use your most recent med list.               amLODipine 10 MG tablet  Commonly known as:  NORVASC  Take 1 tablet (10 mg total) by mouth daily.     aspirin 81 MG EC tablet  Take 81 mg by mouth daily. Swallow whole.     benazepril 20 MG tablet  Commonly known as:  LOTENSIN  TAKE 1 TABLET BY MOUTH ONCE DAILY     canagliflozin 100 MG Tabs tablet  Commonly known as:  INVOKANA  1 tablet before breakfast     carvedilol 25 MG tablet  Commonly known as:  COREG  TAKE 1 TABLET BY MOUTH TWICE DAILY WITH MEALS     doxazosin 4 MG tablet  Commonly known as:  CARDURA  TAKE 1 TABLET BY MOUTH ATBEDTIME     DUREZOL 0.05 % Emul  Generic drug:  Difluprednate  Place 1 drop into the right eye 2 (two) times daily.     esomeprazole 40 MG capsule  Commonly known as:  NEXIUM  Take 40 mg by mouth daily at 12 noon.     FLORASTOR 250 MG capsule  Generic drug:  saccharomyces boulardii  Take 250 mg by mouth 2 (two) times daily.     glucagon 1 MG injection  Commonly known as:  GLUCAGON EMERGENCY  Inject 1 mg into the vein once as needed.     HUMALOG KWIKPEN 100 UNIT/ML KiwkPen  Generic drug:  insulin lispro  INJECT 3-9 UNITS AT BREAKFAST 3-4 UNITS AT LUNCH 6-9 UNITS AT DINNER     metFORMIN 750 MG 24 hr tablet  Commonly known as:  GLUCOPHAGE-XR  TAKE 2 TABLETS BY MOUTH AFTER DINNER.     ONE TOUCH ULTRA TEST test strip  Generic drug:  glucose blood  USE ONE STRIP TO CHECK GLUCOSE THREE TIMES DAILY      pioglitazone  15 MG tablet  Commonly known as:  ACTOS  TAKE 1 TABLET BY MOUTH DAILY.     rosuvastatin 5 MG tablet  Commonly known as:  CRESTOR  Take 5 mg by mouth every morning.     sildenafil 20 MG tablet  Commonly known as:  REVATIO  TAKE 5 TABLETS BY MOUTH AS NEEDED once a week.     TOUJEO SOLOSTAR 300 UNIT/ML Sopn  Generic drug:  Insulin Glargine  INJECT 18-19 UNITS INTO SKIN EVERY MORNING     zolpidem 12.5 MG CR tablet  Commonly known as:  AMBIEN CR  Take 1 tablet (12.5 mg total) by mouth at bedtime as needed for sleep.        Allergies:  Allergies  Allergen Reactions  . Glycopyrrolate Rash    Past Medical History  Diagnosis Date  . Diabetes mellitus without complication ([place])   . Hypertension   . Sleep apnea   . Lymphocytosis   . CLL (chronic lymphocytic leukemia) ([place])   . GERD (gastroesophageal reflux disease)   . Hyperlipidemia     Past Surgical History  Procedure Laterality Date  . Hemorrhoid surgery      Sclerotherapy  . Blepharoplasty      Family History  Problem Relation Age of Onset  . Kidney disease Mother   . CAD Maternal Aunt     Social History:  reports that he has never smoked. He does not have any smokeless tobacco history on file. He reports that he drinks alcohol. He reports that he does not use illicit drugs.  Review of Systems:  ROS   HYPERLIPIDEMIA: The lipid abnormality consists of elevated LDL which is very well controlled with low-dose Crestor  Lab Results  Component Value Date   CHOL 110 01/08/2016   HDL 45.40 01/08/2016   LDLCALC 55 01/08/2016   TRIG 51.0 01/08/2016   CHOLHDL 2 01/08/2016    Last colonoscopy was in April 2012.  No results found for: OCCULTBLD    He has mild CLL which is followed is by the hematologist and no treatment currently needed  Currently denies any night sweats, weight loss, decreased appetite or weakness Does not have any anemia or thrombocytopenia  Lab Results  Component Value Date     WBC 63.8* 05/08/2016   HGB 12.9* 05/08/2016   HCT 40.3 05/08/2016   MCV 93.9 05/08/2016   PLT 202 05/08/2016     Chronic insomnia present and  treated with Ambien CR   Diagnosed with sleep apnea (11/09) treated with CPAP. Sleeps better and less daytime fatigue.    Examination:   BP 130/82 mmHg  Pulse 61  Temp(Src) 98.2 F (36.8 C)  Resp 16  Ht 5\' 7"  (1.702 m)  Wt 161 lb 6.4 oz (73.211 kg)  BMI 25.27 kg/m2  SpO2 97%  Body mass index is 25.27 kg/(m^2).      ASSESSMENT/ PLAN:   Diabetes type 2, inadequately controlled with generally high A1c over 8% See history of present illness for detailed discussion of his current management, blood sugar patterns and problems identified  With starting Invokana his blood sugars are better overall He is not having as much fluctuation of his blood sugar also His Toujeo was reduced by 2 units and his fasting blood sugars are generally looking fairly good With the same mealtime insulin at suppertime his blood sugars are also not as consistently high but he has trouble accurately judging how much he needs, not doing carbohydrate counting    For  now he will continue the same insulin regimen  Discussed checking blood sugars more often consistently at various times including after meals  He needs to add extra 2-4 units for higher carbohydrate and higher fat meals and also not take extra rapid acting insulin at bedtime  If his fasting readings to stay high will need to go up 1 or 2 units and Toujeo again  Stop Metformin  HYPERTENSION: Well controlled He will reduce his amlodipine to half tablet when the starting Invokana this week To check blood pressure consistently    Patient Instructions  May leave off Metformin    Check blood sugars on waking up 4  times a week Also check blood sugars about 2 hours after a meal and do this after different meals by rotation  Recommended blood sugar levels on waking up is 90-130 and about 2  hours after meal is 130-160  Please bring your blood sugar monitor to each visit, thank you  Check BP  ? 13 Toujeo if am sugar high      Counseling time on subjects discussed above is over 50% of today's 25 minute visit   Porsha Skilton 05/17/2016, 3:47 PM

## 2016-06-21 ENCOUNTER — Encounter: Payer: Self-pay | Admitting: Internal Medicine

## 2016-06-27 ENCOUNTER — Other Ambulatory Visit: Payer: Self-pay | Admitting: Endocrinology

## 2016-07-08 ENCOUNTER — Other Ambulatory Visit (INDEPENDENT_AMBULATORY_CARE_PROVIDER_SITE_OTHER): Payer: Medicare Other

## 2016-07-08 DIAGNOSIS — Z794 Long term (current) use of insulin: Secondary | ICD-10-CM | POA: Diagnosis not present

## 2016-07-08 DIAGNOSIS — E1165 Type 2 diabetes mellitus with hyperglycemia: Secondary | ICD-10-CM

## 2016-07-08 LAB — COMPREHENSIVE METABOLIC PANEL
ALK PHOS: 37 U/L — AB (ref 39–117)
ALT: 14 U/L (ref 0–53)
AST: 16 U/L (ref 0–37)
Albumin: 3.5 g/dL (ref 3.5–5.2)
BILIRUBIN TOTAL: 0.4 mg/dL (ref 0.2–1.2)
BUN: 23 mg/dL (ref 6–23)
CALCIUM: 9 mg/dL (ref 8.4–10.5)
CO2: 30 meq/L (ref 19–32)
Chloride: 105 mEq/L (ref 96–112)
Creatinine, Ser: 1.25 mg/dL (ref 0.40–1.50)
GFR: 60.24 mL/min (ref >60.00)
Glucose, Bld: 232 mg/dL — ABNORMAL HIGH (ref 70–99)
Potassium: 4.4 mEq/L (ref 3.5–5.1)
Sodium: 139 mEq/L (ref 135–145)
Total Protein: 5.6 g/dL — ABNORMAL LOW (ref 6.0–8.3)

## 2016-07-08 LAB — HEMOGLOBIN A1C: Hgb A1c MFr Bld: 7.3 % — ABNORMAL HIGH (ref 4.6–6.5)

## 2016-07-11 ENCOUNTER — Encounter: Payer: Self-pay | Admitting: Endocrinology

## 2016-07-11 ENCOUNTER — Ambulatory Visit (INDEPENDENT_AMBULATORY_CARE_PROVIDER_SITE_OTHER): Payer: Medicare Other | Admitting: Endocrinology

## 2016-07-11 VITALS — BP 142/80 | HR 54 | Wt 162.0 lb

## 2016-07-11 DIAGNOSIS — E1165 Type 2 diabetes mellitus with hyperglycemia: Secondary | ICD-10-CM | POA: Diagnosis not present

## 2016-07-11 DIAGNOSIS — Z794 Long term (current) use of insulin: Secondary | ICD-10-CM | POA: Diagnosis not present

## 2016-07-11 MED ORDER — INSULIN DEGLUDEC 100 UNIT/ML ~~LOC~~ SOPN: 15.0000 [IU] | PEN_INJECTOR | Freq: Every day | SUBCUTANEOUS | 2 refills | Status: DC

## 2016-07-11 NOTE — Patient Instructions (Signed)
Check blood sugars on waking up 4-5  times a week Also check blood sugars about 2 hours after a meal and do this after different meals by rotation  Recommended blood sugar levels on waking up is 90-130 and about 2 hours after meal is 130-160  Please bring your blood sugar monitor to each visit, thank you  Tresiba 12, may need to reduce if ams low

## 2016-07-11 NOTE — Progress Notes (Signed)
Patient ID: Tony Marshall, male   DOB: 02/09/1941, 75 y.o.   MRN: [id number]   Reason for Appointment: FOLLOW-up  History of Present Illness   1. Type 2 DIABETES MELITUS, date of diagnosis:  1992        His blood sugar has been  difficult to control over the last few years because of variability in his blood sugars On basal bolus insulin for a few years and his oral hypoglycemic drugs have been continued Also did not tolerate GLP-1 drugs or symlin because of nausea He is essentially looking like a a type I patient with requiring relatively low amounts of insulin and twice a day dosage of basal insulin He had done somewhat better with Levemir compared to Lantus and was using this twice a day also This was changed to Toujeo 4 once a day convenience  Recent history:  Insulin regimen: Toujeo insulin 12 units at 8 am.  Mealtime insulin: Breakfast = 3-4 units,  supper 8-10  units based on meal size   He was started on Invokana in 4/17 His A1c has improved to 7.3, usually not below 8%  Recent blood sugar patterns and problems:  His monitor was not downloaded today as he did not bring it  He does notice that his fasting readings are not fluctuating as much and also not low in the morning  He thinks that his blood sugars may sometimes go up after supper if he is eating larger meals are more carbohydrate and not always taking enough insulin.  He will then take some extra insulin for correction  No overnight hypoglycemia but he may occasionally have low normal readings at suppertime; may sometimes have a light lunch  His weight has leveled off   Hypoglycemia:  has only occasional low blood sugars in the afternoon   Oral hypoglycemic drugs: Actos, Metformin        Side effects from medications: None       Proper timing of medications in relation to meals: Yes.          Monitors blood glucose: QD    Glucometer: One Touch.          Blood Glucose readings from  recall:   PRE-MEAL Fasting Lunch Dinner Bedtime Overall  Glucose range: 112-135  60 ?  140-250   Mean/median:          Meals: 2 meals per day, Usually late breakfast and late evening meal  He is eating less High-fat foods and carbohydrates overall Has Breakfast at 10 am dinner at 7-8 PM     Physical activity: exercise:  walking in pm, golf     Wt Readings from Last 3 Encounters:  07/11/16 162 lb (73.5 kg)  05/17/16 161 lb 6.4 oz (73.2 kg)  05/08/16 163 lb 11.2 oz (74.3 kg)         LABS:  Lab Results  Component Value Date   HGBA1C 7.3 (H) 07/08/2016   HGBA1C 7.8 (H) 04/05/2016   HGBA1C 8.1 (H) 01/08/2016   Lab Results  Component Value Date   MICROALBUR 3.5 (H) 04/05/2016   LDLCALC 55 01/08/2016   CREATININE 1.25 07/08/2016    2. Hypertension:    His blood pressure has been high for about 30 years.  He was evaluated in 1/08 with urine metanephrines and renal artery ultrasound.    home blood pressure about 130-135  Because of starting Invokana his amlodipine was reduced to half a tablet  Lab on 07/08/2016  Component Date Value Ref Range Status  . Hgb A1c MFr Bld 07/08/2016 7.3* 4.6 - 6.5 % Final  . Sodium 07/08/2016 139  135 - 145 mEq/L Final  . Potassium 07/08/2016 4.4  3.5 - 5.1 mEq/L Final  . Chloride 07/08/2016 105  96 - 112 mEq/L Final  . CO2 07/08/2016 30  19 - 32 mEq/L Final  . Glucose, Bld 07/08/2016 232* 70 - 99 mg/dL Final  . BUN 07/08/2016 23  6 - 23 mg/dL Final  . Creatinine, Ser 07/08/2016 1.25  0.40 - 1.50 mg/dL Final  . Total Bilirubin 07/08/2016 0.4  0.2 - 1.2 mg/dL Final  . Alkaline Phosphatase 07/08/2016 37* 39 - 117 U/L Final  . AST 07/08/2016 16  0 - 37 U/L Final  . ALT 07/08/2016 14  0 - 53 U/L Final  . Total Protein 07/08/2016 5.6* 6.0 - 8.3 g/dL Final  . Albumin 07/08/2016 3.5  3.5 - 5.2 g/dL Final  . Calcium 07/08/2016 9.0  8.4 - 10.5 mg/dL Final  . GFR 07/08/2016 60.24  >60.00 mL/min Final      Medication List        Accurate as of 07/11/16  3:36 PM. Always use your most recent med list.          amLODipine 10 MG tablet Commonly known as:  NORVASC Take 1 tablet (10 mg total) by mouth daily.   aspirin 81 MG EC tablet Take 81 mg by mouth daily. Swallow whole.   benazepril 20 MG tablet Commonly known as:  LOTENSIN TAKE 1 TABLET BY MOUTH ONCE DAILY   canagliflozin 100 MG Tabs tablet Commonly known as:  INVOKANA 1 tablet before breakfast   carvedilol 25 MG tablet Commonly known as:  COREG TAKE 1 TABLET BY MOUTH TWICE DAILY WITH MEALS   doxazosin 4 MG tablet Commonly known as:  CARDURA TAKE 1 TABLET BY MOUTH ATBEDTIME   DUREZOL 0.05 % Emul Generic drug:  Difluprednate Place 1 drop into the right eye 2 (two) times daily.   esomeprazole 40 MG capsule Commonly known as:  NEXIUM Take 40 mg by mouth daily at 12 noon.   FLORASTOR 250 MG capsule Generic drug:  saccharomyces boulardii Take 250 mg by mouth 2 (two) times daily.   glucagon 1 MG injection Commonly known as:  GLUCAGON EMERGENCY Inject 1 mg into the vein once as needed.   HUMALOG KWIKPEN 100 UNIT/ML KiwkPen Generic drug:  insulin lispro INJECT 3-9 UNITS AT BREAKFAST, 3-4 UNITS AT LUNCH AND 6-9 UNITS AT DINNER   metFORMIN 750 MG 24 hr tablet Commonly known as:  GLUCOPHAGE-XR TAKE 2 TABLETS BY MOUTH AFTER DINNER.   ONE TOUCH ULTRA TEST test strip Generic drug:  glucose blood USE ONE STRIP TO CHECK GLUCOSE THREE TIMES DAILY   pioglitazone 15 MG tablet Commonly known as:  ACTOS TAKE 1 TABLET BY MOUTH DAILY.   rosuvastatin 5 MG tablet Commonly known as:  CRESTOR Take 5 mg by mouth every morning.   sildenafil 20 MG tablet Commonly known as:  REVATIO TAKE 5 TABLETS BY MOUTH AS NEEDED once a week.   TOUJEO SOLOSTAR 300 UNIT/ML Sopn Generic drug:  Insulin Glargine INJECT 18-19 UNITS INTO SKIN EVERY MORNING   zolpidem 12.5 MG CR tablet Commonly known as:  AMBIEN CR Take 1 tablet (12.5 mg total) by mouth at bedtime as  needed for sleep.       Allergies:  Allergies  Allergen Reactions  . Glycopyrrolate Rash  Past Medical History:  Diagnosis Date  . CLL (chronic lymphocytic leukemia) ([place])   . Diabetes mellitus without complication ([place])   . GERD (gastroesophageal reflux disease)   . Hyperlipidemia   . Hypertension   . Lymphocytosis   . Sleep apnea     Past Surgical History:  Procedure Laterality Date  . BLEPHAROPLASTY    . HEMORRHOID SURGERY     Sclerotherapy    Family History  Problem Relation Age of Onset  . Kidney disease Mother   . CAD Maternal Aunt     Social History:  reports that he has never smoked. He does not have any smokeless tobacco history on file. He reports that he drinks alcohol. He reports that he does not use drugs.   ROS   HYPERLIPIDEMIA: The lipid abnormality consists of elevated LDL which is very well controlled with low-dose Crestor  Lab Results  Component Value Date   CHOL 110 01/08/2016   HDL 45.40 01/08/2016   LDLCALC 55 01/08/2016   TRIG 51.0 01/08/2016   CHOLHDL 2 01/08/2016    Last colonoscopy was in April 2012.  No results found for: OCCULTBLD    He has mild CLL which is followed is by the hematologist and no treatment currently needed  No night sweats, weight loss, decreased appetite or weakness Does not have any anemia or thrombocytopenia  Lab Results  Component Value Date   WBC 63.8 (HH) 05/08/2016   HGB 12.9 (L) 05/08/2016   HCT 40.3 05/08/2016   MCV 93.9 05/08/2016   PLT 202 05/08/2016     Chronic insomnia present and  treated with Ambien CR   Diagnosed with sleep apnea (11/09) treated with CPAP. Sleeps better and less daytime fatigue.    Examination:   BP (!) 142/80 (BP Location: Left Arm, Patient Position: Sitting)   Pulse (!) 54   Wt 162 lb (73.5 kg)   SpO2 (!) 54%   BMI 25.37 kg/m   Body mass index is 25.37 kg/m.   Exam not indicated   ASSESSMENT/ PLAN:   Diabetes type 2, inadequately controlled with  generally high A1c over 8% See history of present illness for detailed discussion of his current management, blood sugar patterns and problems identified  With  Invokana his blood sugars are better overall, A1c 7.3 and has been previously over 8% He is not having as much fluctuation of his blood sugar especially fasting However has not reduced his insulin requirement much except by couple of minutes on his Toujeo Again has difficulty judging the amount of insulin to take to cover his evening meal and may sometimes have high readings over 200 No change in level of control with stopping metformin Also he is now able to get [place] covered by his insurance and will try this instead of Toujeo, may need 1-2 units less, he will watch his morning readings Also he will drop his monitor tomorrow off for download   There are no Patient Instructions on file for this visit.     Tony Marshall 07/11/2016, 3:36 PM

## 2016-07-15 ENCOUNTER — Telehealth: Payer: Self-pay | Admitting: Endocrinology

## 2016-07-15 NOTE — Telephone Encounter (Signed)
Blood sugars are tending to be low after 3 PM until 8-9 PM. He needs to change taking Invokana to dinnertime instead of breakfast and also make sure to have a daily snack with protein in the afternoons after 3 PM

## 2016-07-15 NOTE — Telephone Encounter (Signed)
I contacted the pt and advised of note below via vm. Requested a call back if the pt would like to discuss.  

## 2016-08-01 ENCOUNTER — Ambulatory Visit (AMBULATORY_SURGERY_CENTER): Payer: Self-pay | Admitting: *Deleted

## 2016-08-01 VITALS — Ht 67.0 in | Wt 163.4 lb

## 2016-08-01 DIAGNOSIS — Z86010 Personal history of colonic polyps: Secondary | ICD-10-CM

## 2016-08-01 MED ORDER — NA SULFATE-K SULFATE-MG SULF 17.5-3.13-1.6 GM/177ML PO SOLN: 1.0000 | Freq: Once | ORAL | 0 refills | Status: AC

## 2016-08-01 NOTE — Progress Notes (Signed)
Denies allergies to eggs or soy products. Denies complications with sedation or anesthesia. Denies O2 use. Denies use of diet or weight loss medications.  Emmi instructions not given for colonoscopy.  

## 2016-08-06 ENCOUNTER — Encounter: Payer: Self-pay | Admitting: Internal Medicine

## 2016-08-08 ENCOUNTER — Other Ambulatory Visit: Payer: Self-pay | Admitting: Endocrinology

## 2016-08-08 ENCOUNTER — Encounter: Payer: Self-pay | Admitting: Internal Medicine

## 2016-08-08 ENCOUNTER — Telehealth: Payer: Self-pay | Admitting: Internal Medicine

## 2016-08-08 ENCOUNTER — Ambulatory Visit (HOSPITAL_BASED_OUTPATIENT_CLINIC_OR_DEPARTMENT_OTHER): Payer: Medicare Other | Admitting: Internal Medicine

## 2016-08-08 ENCOUNTER — Other Ambulatory Visit (HOSPITAL_BASED_OUTPATIENT_CLINIC_OR_DEPARTMENT_OTHER): Payer: Medicare Other

## 2016-08-08 VITALS — BP 156/73 | HR 58 | Temp 98.2°F | Resp 17 | Ht 67.0 in | Wt 163.5 lb

## 2016-08-08 DIAGNOSIS — C9110 Chronic lymphocytic leukemia of B-cell type not having achieved remission: Secondary | ICD-10-CM

## 2016-08-08 LAB — COMPREHENSIVE METABOLIC PANEL
ALK PHOS: 49 U/L (ref 40–150)
ALT: 17 U/L (ref 0–55)
AST: 21 U/L (ref 5–34)
Albumin: 3.4 g/dL — ABNORMAL LOW (ref 3.5–5.0)
Anion Gap: 7 mEq/L (ref 3–11)
BILIRUBIN TOTAL: 0.37 mg/dL (ref 0.20–1.20)
BUN: 26.8 mg/dL — AB (ref 7.0–26.0)
CHLORIDE: 108 meq/L (ref 98–109)
CO2: 26 meq/L (ref 22–29)
Calcium: 9.3 mg/dL (ref 8.4–10.4)
Creatinine: 1.3 mg/dL (ref 0.7–1.3)
EGFR: 55 mL/min/{1.73_m2} — AB (ref >90)
GLUCOSE: 175 mg/dL — AB (ref 70–140)
POTASSIUM: 4.3 meq/L (ref 3.5–5.1)
SODIUM: 141 meq/L (ref 136–145)
Total Protein: 6 g/dL — ABNORMAL LOW (ref 6.4–8.3)

## 2016-08-08 LAB — CBC WITH DIFFERENTIAL/PLATELET
BASO%: 0.3 % (ref 0.0–2.0)
BASOS ABS: 0.2 10*3/uL — AB (ref 0.0–0.1)
EOS%: 0.7 % (ref 0.0–7.0)
Eosinophils Absolute: 0.4 10*3/uL (ref 0.0–0.5)
HCT: 43.2 % (ref 38.4–49.9)
HEMOGLOBIN: 13.7 g/dL (ref 13.0–17.1)
LYMPH%: 92.8 % — AB (ref 14.0–49.0)
MCH: 29.4 pg (ref 27.2–33.4)
MCHC: 31.7 g/dL — AB (ref 32.0–36.0)
MCV: 92.7 fL (ref 79.3–98.0)
MONO#: 0.6 10*3/uL (ref 0.1–0.9)
MONO%: 0.8 % (ref 0.0–14.0)
NEUT#: 3.7 10*3/uL (ref 1.5–6.5)
NEUT%: 5.4 % — ABNORMAL LOW (ref 39.0–75.0)
Platelets: 222 10*3/uL (ref 140–400)
RBC: 4.66 10*6/uL (ref 4.20–5.82)
RDW: 15.2 % — AB (ref 11.0–14.6)
WBC: 68.9 10*3/uL (ref 4.0–10.3)
lymph#: 64 10*3/uL — ABNORMAL HIGH (ref 0.9–3.3)

## 2016-08-08 LAB — TECHNOLOGIST REVIEW

## 2016-08-08 LAB — LACTATE DEHYDROGENASE: LDH: 231 U/L (ref 125–245)

## 2016-08-08 NOTE — Telephone Encounter (Signed)
GAVE PATIENT AVS REPORT AND APPOINTMENTS FOR November.  °

## 2016-08-08 NOTE — Progress Notes (Signed)
[facility] Telephone:([phone]   Fax:([phone]) [place], [place] [address]  DIAGNOSIS: Chronic lymphocytic leukemia diagnosed in October 2014.  PRIOR THERAPY: None  CURRENT THERAPY: Observation.  INTERVAL HISTORY: Tony Marshall 75 y.o. male returns to the clinic today for routine three-month follow-up visit accompanied by his wife. The patient continues to do well with no complaints and no significant change since his last visit. He denied having any significant weight loss but has occasional night sweats. He has no palpable lymphadenopathy. He has no chest pain, shortness of breath, cough or hemoptysis. He has no significant nausea or vomiting. He had repeat CBC performed earlier today and his here for evaluation and discussion of his lab results.  MEDICAL HISTORY: Past Medical History:  Diagnosis Date  . CLL (chronic lymphocytic leukemia) ([place])   . Diabetes mellitus without complication ([place])   . GERD (gastroesophageal reflux disease)   . Hyperlipidemia   . Hypertension   . Lymphocytosis   . Sleep apnea     ALLERGIES:  is allergic to glycopyrrolate.  MEDICATIONS:  Current Outpatient Prescriptions  Medication Sig Dispense Refill  . aspirin 81 MG EC tablet Take 81 mg by mouth daily. Swallow whole.    . benazepril (LOTENSIN) 20 MG tablet TAKE 1 TABLET BY MOUTH ONCE DAILY 90 tablet 1  . canagliflozin (INVOKANA) 100 MG TABS tablet 1 tablet before breakfast 30 tablet 3  . carvedilol (COREG) 25 MG tablet TAKE 1 TABLET BY MOUTH TWICE DAILY WITH MEALS 180 tablet 1  . doxazosin (CARDURA) 4 MG tablet TAKE 1 TABLET BY MOUTH ATBEDTIME 90 tablet 1  . DUREZOL 0.05 % EMUL Place 1 drop into the right eye 2 (two) times daily.    Marland Kitchen esomeprazole (NEXIUM) 40 MG capsule Take 40 mg by mouth daily at 12 noon.    Marland Kitchen glucagon (GLUCAGON EMERGENCY) 1 MG injection Inject 1 mg into the vein once as needed. 1 each  5  . HUMALOG KWIKPEN 100 UNIT/ML KiwkPen INJECT 3-9 UNITS AT BREAKFAST, 3-4 UNITS AT LUNCH AND 6-9 UNITS AT DINNER 45 mL 0  . Insulin Degludec (TRESIBA FLEXTOUCH) 100 UNIT/ML SOPN Inject 15 Units into the skin daily. Adjust the insulin doses as directed, start with 12 units once a day 5 pen 2  . Insulin Glargine (TOUJEO SOLOSTAR) 300 UNIT/ML SOPN Inject into the skin daily. Inject 18-19 units every morning    . ONE TOUCH ULTRA TEST test strip USE ONE STRIP TO CHECK GLUCOSE THREE TIMES DAILY 100 each 3  . pioglitazone (ACTOS) 15 MG tablet TAKE 1 TABLET BY MOUTH DAILY. 90 tablet 1  . rosuvastatin (CRESTOR) 5 MG tablet Take 5 mg by mouth every morning.    . saccharomyces boulardii (FLORASTOR) 250 MG capsule Take 250 mg by mouth 2 (two) times daily.    . sildenafil (REVATIO) 20 MG tablet TAKE 5 TABLETS BY MOUTH AS NEEDED once a week. 30 tablet 2  . zolpidem (AMBIEN CR) 12.5 MG CR tablet Take 1 tablet (12.5 mg total) by mouth at bedtime as needed for sleep. 30 tablet 3   No current facility-administered medications for this visit.     SURGICAL HISTORY:  Past Surgical History:  Procedure Laterality Date  . BLEPHAROPLASTY    . COLONOSCOPY  2012  . HEMORRHOID SURGERY     Sclerotherapy  . KNEE SURGERY Right 2002  . POLYPECTOMY    .  SHOULDER SURGERY Right 2007    REVIEW OF SYSTEMS:  A comprehensive review of systems was negative except for: Constitutional: positive for night sweats   PHYSICAL EXAMINATION: General appearance: alert, cooperative and no distress Head: Normocephalic, without obvious abnormality, atraumatic Neck: no adenopathy, no JVD, supple, symmetrical, trachea midline and thyroid not enlarged, symmetric, no tenderness/mass/nodules Lymph nodes: Cervical, supraclavicular, and axillary nodes normal. Resp: clear to auscultation bilaterally Back: symmetric, no curvature. ROM normal. No CVA tenderness. Cardio: regular rate and rhythm, S1, S2 normal, no murmur, click, rub or  gallop GI: soft, non-tender; bowel sounds normal; no masses,  no organomegaly Extremities: extremities normal, atraumatic, no cyanosis or edema  ECOG PERFORMANCE STATUS: 0 - Asymptomatic  Blood pressure (!) 156/73, pulse (!) 58, temperature 98.2 F (36.8 C), temperature source Oral, resp. rate 17, height 5\' 7"  (1.702 m), weight 163 lb 8 oz (74.2 kg), SpO2 100 %.  LABORATORY DATA: Lab Results  Component Value Date   WBC 68.9 (HH) 08/08/2016   HGB 13.7 08/08/2016   HCT 43.2 08/08/2016   MCV 92.7 08/08/2016   PLT 222 08/08/2016      Chemistry      Component Value Date/Time   NA 139 07/08/2016 1203   NA 141 05/08/2016 0854   K 4.4 07/08/2016 1203   K 4.3 05/08/2016 0854   CL 105 07/08/2016 1203   CO2 30 07/08/2016 1203   CO2 24 05/08/2016 0854   BUN 23 07/08/2016 1203   BUN 20.1 05/08/2016 0854   CREATININE 1.25 07/08/2016 1203   CREATININE 1.3 05/08/2016 0854      Component Value Date/Time   CALCIUM 9.0 07/08/2016 1203   CALCIUM 8.7 05/08/2016 0854   ALKPHOS 37 (L) 07/08/2016 1203   ALKPHOS 49 05/08/2016 0854   AST 16 07/08/2016 1203   AST 18 05/08/2016 0854   ALT 14 07/08/2016 1203   ALT 18 05/08/2016 0854   BILITOT 0.4 07/08/2016 1203   BILITOT 0.34 05/08/2016 0854       RADIOGRAPHIC STUDIES: No results found.  ASSESSMENT AND PLAN: This is a very pleasant 75 years old white male with history of chronic lymphocytic leukemia currently on observation. His CBC today showed mild further increase in the total white blood count. The patient is currently asymptomatic.  I discussed the lab result with the patient and his wife. I recommended for him to continue on observation with repeat CBC, comprehensive metabolic panel and LDH in 3 months. He was advised to call immediately if he has any concerning symptoms in the interval. The patient voices understanding of current disease status and treatment options and is in agreement with the current care plan.  All questions  were answered. The patient knows to call the clinic with any problems, questions or concerns. We can certainly see the patient much sooner if necessary.  Disclaimer: This note was dictated with voice recognition software. Similar sounding words can inadvertently be transcribed and may not be corrected upon review.

## 2016-08-15 ENCOUNTER — Ambulatory Visit (AMBULATORY_SURGERY_CENTER): Payer: Medicare Other | Admitting: Internal Medicine

## 2016-08-15 ENCOUNTER — Encounter: Payer: Self-pay | Admitting: Internal Medicine

## 2016-08-15 VITALS — BP 152/65 | HR 58 | Temp 98.9°F | Resp 14 | Ht 67.0 in | Wt 163.0 lb

## 2016-08-15 DIAGNOSIS — Z86010 Personal history of colonic polyps: Secondary | ICD-10-CM | POA: Diagnosis not present

## 2016-08-15 LAB — GLUCOSE, CAPILLARY
GLUCOSE-CAPILLARY: 97 mg/dL (ref 65–99)
Glucose-Capillary: 88 mg/dL (ref 65–99)

## 2016-08-15 MED ORDER — SODIUM CHLORIDE 0.9 % IV SOLN: 500.0000 mL | INTRAVENOUS | Status: DC

## 2016-08-15 NOTE — Op Note (Signed)
[facility] Patient Name: Castulo Imel Procedure Date: 08/15/2016 10:14 AM MRN: [id number] Endoscopist: Jerene Bears , MD Age: 75 Referring MD:  Date of Birth: [birth year] Gender: Male Account #: [id number] Procedure:                Colonoscopy Indications:              Surveillance: Personal history of adenomatous                            polyps; last colonoscopy 5 years ago which was                            normal (Dr. Earlean Shawl), more remote colonic adenomas Medicines:                Monitored Anesthesia Care Procedure:                Pre-Anesthesia Assessment:                           - Prior to the procedure, a History and Physical                            was performed, and patient medications and                            allergies were reviewed. The patient's tolerance of                            previous anesthesia was also reviewed. The risks                            and benefits of the procedure and the sedation                            options and risks were discussed with the patient.                            All questions were answered, and informed consent                            was obtained. Prior Anticoagulants: The patient has                            taken no previous anticoagulant or antiplatelet                            agents. ASA Grade Assessment: II - A patient with                            mild systemic disease. After reviewing the risks                            and benefits, the patient was deemed in  satisfactory condition to undergo the procedure.                           After obtaining informed consent, the colonoscope                            was passed under direct vision. Throughout the                            procedure, the patient's blood pressure, pulse, and                            oxygen saturations were monitored continuously. The                            [id number]  [id number]) was introduced through the anus                            and advanced to the the cecum, identified by                            appendiceal orifice and ileocecal valve. The Model                            [id number]) scope was introduced                            through the and advanced to the. The colonoscopy                            was somewhat difficult due to a tortuous colon.                            Successful completion of the procedure was aided by                            using manual pressure and withdrawing the scope and                            replacing with the pediatric colonoscope. The                            patient tolerated the procedure well. The quality                            of the bowel preparation was good. The ileocecal                            valve, appendiceal orifice, and rectum were                            photographed. Scope In: 10:15:45 AM Scope Out: 10:37:54 AM Scope Withdrawal Time: 0 hours 6 minutes 32 seconds  Total Procedure Duration: 0  hours 22 minutes 9 seconds  Findings:                 The perianal exam findings include non-thrombosed                            external hemorrhoids and non-thrombosed internal                            hemorrhoids.                           Multiple small and large-mouthed diverticula were                            found in the sigmoid colon, descending colon,                            splenic flexure, hepatic flexure and ascending                            colon.                           External and internal hemorrhoids were found during                            retroflexion and during perianal exam. The                            hemorrhoids were medium-sized.                           The exam was otherwise without abnormality. Complications:            No immediate complications. Estimated Blood Loss:     Estimated blood loss: none. Impression:                - Severe diverticulosis in the sigmoid colon, in                            the descending colon, at the splenic flexure, at                            the hepatic flexure and in the ascending colon.                           - External and internal hemorrhoids.                           - The examination was otherwise normal.                           - No specimens collected. Recommendation:           - Patient has a contact number available for  emergencies. The signs and symptoms of potential                            delayed complications were discussed with the                            patient. Return to normal activities tomorrow.                            Written discharge instructions were provided to the                            patient.                           - Resume previous diet.                           - Continue present medications.                           - No repeat colonoscopy due to age and the absence                            of colonic polyps at colonic today and in 2012. Jerene Bears, MD 08/15/2016 10:50:18 AM This report has been signed electronically.

## 2016-08-15 NOTE — Progress Notes (Signed)
To recovery, report to [place], RN, VSS. 

## 2016-08-15 NOTE — Patient Instructions (Signed)
YOU HAD AN ENDOSCOPIC PROCEDURE TODAY AT [facility]:   Refer to the procedure report that was given to you for any specific questions about what was found during the examination.  If the procedure report does not answer your questions, please call your gastroenterologist to clarify.  If you requested that your care partner not be given the details of your procedure findings, then the procedure report has been included in a sealed envelope for you to review at your convenience later.  YOU SHOULD EXPECT: Some feelings of bloating in the abdomen. Passage of more gas than usual.  Walking can help get rid of the air that was put into your GI tract during the procedure and reduce the bloating. If you had a lower endoscopy (such as a colonoscopy or flexible sigmoidoscopy) you may notice spotting of blood in your stool or on the toilet paper. If you underwent a bowel prep for your procedure, you may not have a normal bowel movement for a few days.  Please Note:  You might notice some irritation and congestion in your nose or some drainage.  This is from the oxygen used during your procedure.  There is no need for concern and it should clear up in a day or so.  SYMPTOMS TO REPORT IMMEDIATELY:   Following lower endoscopy (colonoscopy or flexible sigmoidoscopy):  Excessive amounts of blood in the stool  Significant tenderness or worsening of abdominal pains  Swelling of the abdomen that is new, acute  Fever of 100F or higher    For urgent or emergent issues, a gastroenterologist can be reached at any hour by calling [phone].   DIET:  We do recommend a small meal at first, but then you may proceed to your regular diet.  Drink plenty of fluids but you should avoid alcoholic beverages for 24 hours.  ACTIVITY:  You should plan to take it easy for the rest of today and you should NOT DRIVE or use heavy machinery until tomorrow (because of the sedation medicines used during the test).     FOLLOW UP: Our staff will call the number listed on your records the next business day following your procedure to check on you and address any questions or concerns that you may have regarding the information given to you following your procedure. If we do not reach you, we will leave a message.  However, if you are feeling well and you are not experiencing any problems, there is no need to return our call.  We will assume that you have returned to your regular daily activities without incident.  If any biopsies were taken you will be contacted by phone or by letter within the next 1-3 weeks.  Please call us at [phone] if you have not heard about the biopsies in 3 weeks.    SIGNATURES/CONFIDENTIALITY: You and/or your care partner have signed paperwork which will be entered into your electronic medical record.  These signatures attest to the fact that that the information above on your After Visit Summary has been reviewed and is understood.  Full responsibility of the confidentiality of this discharge information lies with you and/or your care-partner.  Diverticulosis, high fiber diet, hemorrhoids-handouts given  No repeat screening colonoscopy needed due to age and lack of colon polyps.

## 2016-08-16 ENCOUNTER — Telehealth: Payer: Self-pay | Admitting: *Deleted

## 2016-08-16 NOTE — Telephone Encounter (Signed)
  Follow up Call-  Call back number 08/15/2016  Post procedure Call Back phone  # [phone]  Permission to leave phone message Yes  Some recent data might be hidden     Patient questions:  Do you have a fever, pain , or abdominal swelling? No. Pain Score  0 *  Have you tolerated food without any problems? Yes.    Have you been able to return to your normal activities? Yes.    Do you have any questions about your discharge instructions: Diet   No. Medications  No. Follow up visit  No.  Do you have questions or concerns about your Care? No.  Actions: * If pain score is 4 or above: No action needed, pain <4.

## 2016-09-10 ENCOUNTER — Telehealth: Payer: Self-pay | Admitting: Endocrinology

## 2016-09-10 NOTE — Telephone Encounter (Signed)
Please see below and advise.

## 2016-09-10 NOTE — Telephone Encounter (Signed)
There is no alternative.  He can apply for patient assistance

## 2016-09-10 NOTE — Telephone Encounter (Signed)
Patient ask if you have another alternative to the [employer] it is to expensive and in the doughnut hole please advise

## 2016-09-11 NOTE — Telephone Encounter (Signed)
Formed mailed to patient, he is aware.

## 2016-09-14 ENCOUNTER — Other Ambulatory Visit: Payer: Self-pay | Admitting: Endocrinology

## 2016-09-19 ENCOUNTER — Other Ambulatory Visit: Payer: Self-pay | Admitting: Endocrinology

## 2016-10-02 ENCOUNTER — Other Ambulatory Visit: Payer: Self-pay | Admitting: Endocrinology

## 2016-10-08 ENCOUNTER — Other Ambulatory Visit (INDEPENDENT_AMBULATORY_CARE_PROVIDER_SITE_OTHER): Payer: Medicare Other

## 2016-10-08 ENCOUNTER — Telehealth: Payer: Self-pay | Admitting: Endocrinology

## 2016-10-08 DIAGNOSIS — E1165 Type 2 diabetes mellitus with hyperglycemia: Secondary | ICD-10-CM | POA: Diagnosis not present

## 2016-10-08 DIAGNOSIS — Z794 Long term (current) use of insulin: Secondary | ICD-10-CM

## 2016-10-08 LAB — HEMOGLOBIN A1C: HEMOGLOBIN A1C: 7.2 % — AB (ref 4.6–6.5)

## 2016-10-08 LAB — COMPREHENSIVE METABOLIC PANEL
ALK PHOS: 42 U/L (ref 39–117)
ALT: 17 U/L (ref 0–53)
AST: 20 U/L (ref 0–37)
Albumin: 3.8 g/dL (ref 3.5–5.2)
BILIRUBIN TOTAL: 0.3 mg/dL (ref 0.2–1.2)
BUN: 25 mg/dL — AB (ref 6–23)
CO2: 29 meq/L (ref 19–32)
CREATININE: 1.17 mg/dL (ref 0.40–1.50)
Calcium: 8.8 mg/dL (ref 8.4–10.5)
Chloride: 104 mEq/L (ref 96–112)
GFR: 64.97 mL/min (ref >60.00)
GLUCOSE: 240 mg/dL — AB (ref 70–99)
Potassium: 4 mEq/L (ref 3.5–5.1)
SODIUM: 138 meq/L (ref 135–145)
TOTAL PROTEIN: 5.8 g/dL — AB (ref 6.0–8.3)

## 2016-10-08 NOTE — Telephone Encounter (Signed)
[facility] [employer] called to speak with you regarding this Pt.  Can call back at [phone]

## 2016-10-11 ENCOUNTER — Ambulatory Visit (INDEPENDENT_AMBULATORY_CARE_PROVIDER_SITE_OTHER): Payer: Medicare Other | Admitting: Endocrinology

## 2016-10-11 ENCOUNTER — Encounter: Payer: Self-pay | Admitting: Endocrinology

## 2016-10-11 VITALS — BP 126/78 | Ht 67.0 in | Wt 163.0 lb

## 2016-10-11 DIAGNOSIS — I10 Essential (primary) hypertension: Secondary | ICD-10-CM

## 2016-10-11 DIAGNOSIS — Z794 Long term (current) use of insulin: Secondary | ICD-10-CM

## 2016-10-11 DIAGNOSIS — Z23 Encounter for immunization: Secondary | ICD-10-CM

## 2016-10-11 DIAGNOSIS — E1165 Type 2 diabetes mellitus with hyperglycemia: Secondary | ICD-10-CM

## 2016-10-11 DIAGNOSIS — E7800 Pure hypercholesterolemia, unspecified: Secondary | ICD-10-CM | POA: Diagnosis not present

## 2016-10-11 NOTE — Progress Notes (Signed)
Patient ID: Tony Marshall, male   DOB: 03-14-1941, 75 y.o.   MRN: [id number]   Reason for Appointment: FOLLOW-up  History of Present Illness   1. Type 2 DIABETES MELITUS, date of diagnosis:  1992        His blood sugar has been  difficult to control over the last few years because of variability in his blood sugars On basal bolus insulin for a few years and his oral hypoglycemic drugs have been continued Also did not tolerate GLP-1 drugs or symlin because of nausea He is essentially looking like a a type I patient with requiring relatively low amounts of insulin and twice a day dosage of basal insulin He had done somewhat better with Levemir compared to Lantus and was using this twice a day also This was changed to Toujeo for once a day convenience  Recent history:  Insulin regimen: Toujeo insulin 12 units at 8 am.  Mealtime insulin: Breakfast = 3-4 units, no lanch;  supper 6-8   units based on meal size   He was started on Invokana in 4/17 His A1c has improved to 7.2, previously 7.3, usually not below 8%  Recent blood sugar patterns and problems:  His monitor has morning and evening times backwards and difficult to analyze this  He is taking Toujeo in the morning and this still seems to be doing fairly well for his fasting glucose readings.  With taking the dose in the evening he was previously having some tendency to low sugars and variable readings overnight  He says that he is tending to have hypoglycemia in the afternoons, he usually does not eat lunch and may be more active doing yardwork in the afternoon without any snacks  Lowest documented blood sugar recently is 66  HIGHEST blood sugars are on an average after breakfast.  He is generally eating eggs and toast  He was taking Invokana before breakfast previously and now taking it around midday since he thought it was causing low sugars in the afternoon  POSTPRANDIAL readings after supper are getting variable  but on an average fairly good.  He is appearing to be taking a little less mealtime coverage at suppertime  Occasionally will take 2 units extra at bedtime if his sugars are 180 or higher  His weight has leveled off   Hypoglycemia:  has only occasional low blood sugars in the afternoon  Oral hypoglycemic drugs: Actos, Invokana        Side effects from medications: None       Proper timing of medications in relation to meals: Yes.          Monitors blood glucose: QD    Glucometer: One Touch.          Blood Glucose readings from   Mean values apply above for all meters except median for One Touch  PRE-MEAL Fasting Lunch Dinner PCS  Overall  Glucose range: 116-195    89-209    Mean/median: 38    145  149+/-44    POST-MEAL PC Breakfast PC Lunch PC Dinner  Glucose range: 108-265     Mean/median: 174      Meals: 2 meals per day, Usually late breakfast and late evening meal  He is eating less High-fat foods and carbohydrates overall Has Breakfast at 10 am dinner at 7-8 PM     Physical activity: exercise:  walking in pm, golf, yard work     [employer] from Last 3 Encounters:  10/11/16 163 lb (73.9 kg)  08/15/16 163 lb (73.9 kg)  08/08/16 163 lb 8 oz (74.2 kg)         LABS:  Lab Results  Component Value Date   HGBA1C 7.2 (H) 10/08/2016   HGBA1C 7.3 (H) 07/08/2016   HGBA1C 7.8 (H) 04/05/2016   Lab Results  Component Value Date   MICROALBUR 3.5 (H) 04/05/2016   LDLCALC 55 01/08/2016   CREATININE 1.17 10/08/2016    2. Hypertension:    His blood pressure has been high for about 30 years.  He was evaluated in 1/08 with urine metanephrines and renal artery ultrasound.    He checks his home blood pressure regularly and this is consistently about 130-135  Blood pressure also consistent in the office    Lab on 10/08/2016  Component Date Value Ref Range Status  . Hgb A1c MFr Bld 10/08/2016 7.2* 4.6 - 6.5 % Final  . Sodium 10/08/2016 138  135 - 145 mEq/L Final  .  Potassium 10/08/2016 4.0  3.5 - 5.1 mEq/L Final  . Chloride 10/08/2016 104  96 - 112 mEq/L Final  . CO2 10/08/2016 29  19 - 32 mEq/L Final  . Glucose, Bld 10/08/2016 240* 70 - 99 mg/dL Final  . BUN 10/08/2016 25* 6 - 23 mg/dL Final  . Creatinine, Ser 10/08/2016 1.17  0.40 - 1.50 mg/dL Final  . Total Bilirubin 10/08/2016 0.3  0.2 - 1.2 mg/dL Final  . Alkaline Phosphatase 10/08/2016 42  39 - 117 U/L Final  . AST 10/08/2016 20  0 - 37 U/L Final  . ALT 10/08/2016 17  0 - 53 U/L Final  . Total Protein 10/08/2016 5.8* 6.0 - 8.3 g/dL Final  . Albumin 10/08/2016 3.8  3.5 - 5.2 g/dL Final  . Calcium 10/08/2016 8.8  8.4 - 10.5 mg/dL Final  . GFR 10/08/2016 64.97  >60.00 mL/min Final      Medication List       Accurate as of 10/11/16 11:10 AM. Always use your most recent med list.          aspirin 81 MG EC tablet Take 81 mg by mouth daily. Swallow whole.   benazepril 20 MG tablet Commonly known as:  LOTENSIN TAKE 1 TABLET BY MOUTH ONCE DAILY   carvedilol 25 MG tablet Commonly known as:  COREG TAKE 1 TABLET BY MOUTH TWICE DAILY WITH MEALS   doxazosin 4 MG tablet Commonly known as:  CARDURA TAKE 1 TABLET BY MOUTH ATBEDTIME   DUREZOL 0.05 % Emul Generic drug:  Difluprednate Place 1 drop into the right eye 2 (two) times daily.   esomeprazole 40 MG capsule Commonly known as:  NEXIUM Take 40 mg by mouth daily at 12 noon.   FLORASTOR 250 MG capsule Generic drug:  saccharomyces boulardii Take 250 mg by mouth 2 (two) times daily.   glucagon 1 MG injection Commonly known as:  GLUCAGON EMERGENCY Inject 1 mg into the vein once as needed.   HUMALOG KWIKPEN 100 UNIT/ML KiwkPen Generic drug:  insulin lispro INJECT 3-9 UNITS AT BREAKFAST, 3-4 UNITS AT LUNCH AND 6-9 UNITS AT DINNER   insulin degludec 100 UNIT/ML Sopn FlexTouch Pen Commonly known as:  TRESIBA FLEXTOUCH Inject 15 Units into the skin daily. Adjust the insulin doses as directed, start with 12 units once a day     INVOKANA 100 MG Tabs tablet Generic drug:  canagliflozin TAKE 1 TABLET BY MOUTH DAILY BEFORE BREAKFAST   ONE TOUCH ULTRA TEST test strip Generic  drug:  glucose blood USE ONE STRIP TO CHECK GLUCOSE THREE TIMES DAILY   pioglitazone 15 MG tablet Commonly known as:  ACTOS TAKE 1 TABLET BY MOUTH DAILY.   rosuvastatin 5 MG tablet Commonly known as:  CRESTOR Take 5 mg by mouth every morning.   sildenafil 20 MG tablet Commonly known as:  REVATIO TAKE 5 TABLETS BY MOUTH AS NEEDED once a week.   TOUJEO SOLOSTAR 300 UNIT/ML Sopn Generic drug:  Insulin Glargine Inject into the skin daily. Inject 12 units every morning   zolpidem 12.5 MG CR tablet Commonly known as:  AMBIEN CR Take 1 tablet (12.5 mg total) by mouth at bedtime as needed for sleep.       Allergies:  Allergies  Allergen Reactions  . Glycopyrrolate Rash    Past Medical History:  Diagnosis Date  . CLL (chronic lymphocytic leukemia) ([place])   . Diabetes mellitus without complication ([place])   . GERD (gastroesophageal reflux disease)   . Hyperlipidemia   . Hypertension   . Lymphocytosis   . Sleep apnea     Past Surgical History:  Procedure Laterality Date  . BLEPHAROPLASTY    . COLONOSCOPY  2012  . HEMORRHOID SURGERY     Sclerotherapy  . KNEE SURGERY Right 2002  . POLYPECTOMY    . SHOULDER SURGERY Right 2007    Family History  Problem Relation Age of Onset  . Kidney disease Mother   . CAD Maternal Aunt   . Colon cancer Neg Hx   . Esophageal cancer Neg Hx   . Rectal cancer Neg Hx   . Stomach cancer Neg Hx     Social History:  reports that he has never smoked. He quit smokeless tobacco use about 3 years ago. His smokeless tobacco use included Chew. He reports that he drinks about 3.6 oz of alcohol per week . He reports that he does not use drugs.   ROS   HYPERLIPIDEMIA: The lipid abnormality consists of elevated LDL which is very well controlled with low-dose Crestor  Lab Results  Component Value  Date   CHOL 110 01/08/2016   HDL 45.40 01/08/2016   LDLCALC 55 01/08/2016   TRIG 51.0 01/08/2016   CHOLHDL 2 01/08/2016    Last colonoscopy was in April 2012.  No results found for: OCCULTBLD    He has  CLL which is followed is by the hematologist and no treatment currently needed Despite significantly high white count  No night sweats, weight loss, decreased appetite or weakness Does not have any anemia or thrombocytopenia  Lab Results  Component Value Date   WBC 68.9 (HH) 08/08/2016   HGB 13.7 08/08/2016   HCT 43.2 08/08/2016   MCV 92.7 08/08/2016   PLT 222 08/08/2016     Chronic insomnia present and  treated with Ambien CR   Diagnosed with sleep apnea (11/09) treated with CPAP. Sleeps better and less daytime fatigueWith continued treatment.    Examination:   BP 126/78   Ht 5\' 7"  (1.702 m)   Wt 163 lb (73.9 kg)   BMI 25.53 kg/m   Body mass index is 25.53 kg/m.     No edema  ASSESSMENT/ PLAN:   Diabetes type 2, inadequately controlled with generally high A1c over 8% See history of present illness for detailed discussion of his current management, blood sugar patterns and problems identified  With  Invokana his blood sugars are better overall, A1c 7.2 and fairly consistent now Also requiring only small doses of insulin  overall including basal and bolus He is not having as much fluctuation of his blood sugar especially fasting He is concerned about getting low sugars in the afternoon but this is usually with not eating lunch and being more active Highest readings after breakfast but this may be because he is taking his Invokana maintains to do before breakfast recently Although he is trying to adjust his evening dose based on meal size he has some fluctuation and postprandial readings  HYPERTENSION: Well controlled  Hyperlipidemia: Will need follow-up lipids on the next visit  Recommendations:  No change in Toujeo as yet unless fasting readings are  higher  Changed the incorrect time on the monitor to the correct time.  Avoid correcting high readings at bedtime unless they are at least 200  Have a snack before doing any exercise or activity in the afternoon  If postprandial readings after breakfast stay high increase morning coverage by 1 unit at least  Continue Invokana; he can try taking a half of 300 mg dose if he cannot afford current prescription, information sent for patient assistance.  Continue monitoring blood pressure at home regularly  Patient Instructions  Invokana in am before  [place] before yard work    Influenza vaccine given  Counseling time on subjects discussed above is over 50% of today's 25 minute visit    Adaline Trejos 10/11/2016, 11:10 AM

## 2016-10-11 NOTE — Patient Instructions (Addendum)
Invokana in am before  Breakfast  Snack before yard work

## 2016-10-15 ENCOUNTER — Other Ambulatory Visit: Payer: Self-pay | Admitting: Endocrinology

## 2016-10-15 NOTE — Telephone Encounter (Signed)
Handled 10/15/16

## 2016-11-11 ENCOUNTER — Other Ambulatory Visit (HOSPITAL_BASED_OUTPATIENT_CLINIC_OR_DEPARTMENT_OTHER): Payer: Medicare Other

## 2016-11-11 ENCOUNTER — Encounter: Payer: Self-pay | Admitting: Internal Medicine

## 2016-11-11 ENCOUNTER — Telehealth: Payer: Self-pay | Admitting: Internal Medicine

## 2016-11-11 ENCOUNTER — Ambulatory Visit (HOSPITAL_BASED_OUTPATIENT_CLINIC_OR_DEPARTMENT_OTHER): Payer: Medicare Other | Admitting: Internal Medicine

## 2016-11-11 VITALS — BP 178/81 | HR 57 | Temp 98.0°F | Resp 18 | Wt 161.9 lb

## 2016-11-11 DIAGNOSIS — C9110 Chronic lymphocytic leukemia of B-cell type not having achieved remission: Secondary | ICD-10-CM

## 2016-11-11 DIAGNOSIS — D72820 Lymphocytosis (symptomatic): Secondary | ICD-10-CM

## 2016-11-11 LAB — CBC WITH DIFFERENTIAL/PLATELET
BASO%: 0.3 % (ref 0.0–2.0)
Basophils Absolute: 0.2 10*3/uL — ABNORMAL HIGH (ref 0.0–0.1)
EOS%: 0.4 % (ref 0.0–7.0)
Eosinophils Absolute: 0.3 10*3/uL (ref 0.0–0.5)
HEMATOCRIT: 46.3 % (ref 38.4–49.9)
HGB: 14.6 g/dL (ref 13.0–17.1)
LYMPH#: 58.2 10*3/uL — AB (ref 0.9–3.3)
LYMPH%: 90.1 % — AB (ref 14.0–49.0)
MCH: 29.6 pg (ref 27.2–33.4)
MCHC: 31.5 g/dL — AB (ref 32.0–36.0)
MCV: 94 fL (ref 79.3–98.0)
MONO#: 1.4 10*3/uL — AB (ref 0.1–0.9)
MONO%: 2.2 % (ref 0.0–14.0)
NEUT%: 7 % — AB (ref 39.0–75.0)
NEUTROS ABS: 4.5 10*3/uL (ref 1.5–6.5)
Platelets: 225 10*3/uL (ref 140–400)
RBC: 4.93 10*6/uL (ref 4.20–5.82)
RDW: 15.3 % — ABNORMAL HIGH (ref 11.0–14.6)
WBC: 64.6 10*3/uL — AB (ref 4.0–10.3)

## 2016-11-11 LAB — COMPREHENSIVE METABOLIC PANEL
ALT: 14 U/L (ref 0–55)
AST: 18 U/L (ref 5–34)
Albumin: 3.6 g/dL (ref 3.5–5.0)
Alkaline Phosphatase: 51 U/L (ref 40–150)
Anion Gap: 9 mEq/L (ref 3–11)
BILIRUBIN TOTAL: 0.39 mg/dL (ref 0.20–1.20)
BUN: 28.2 mg/dL — AB (ref 7.0–26.0)
CHLORIDE: 106 meq/L (ref 98–109)
CO2: 26 meq/L (ref 22–29)
CREATININE: 1.4 mg/dL — AB (ref 0.7–1.3)
Calcium: 9.8 mg/dL (ref 8.4–10.4)
EGFR: 50 mL/min/{1.73_m2} — ABNORMAL LOW (ref >90)
Glucose: 154 mg/dl — ABNORMAL HIGH (ref 70–140)
Potassium: 5 mEq/L (ref 3.5–5.1)
Sodium: 141 mEq/L (ref 136–145)
TOTAL PROTEIN: 6.6 g/dL (ref 6.4–8.3)

## 2016-11-11 LAB — LACTATE DEHYDROGENASE: LDH: 255 U/L — ABNORMAL HIGH (ref 125–245)

## 2016-11-11 LAB — TECHNOLOGIST REVIEW

## 2016-11-11 NOTE — Telephone Encounter (Signed)
Appointments scheduled per 11/11/16 los. A copy of the  AVS report and appointment schedule was given to patient,per 11/11/16 los.  °

## 2016-11-11 NOTE — Progress Notes (Signed)
[facility] [phone]   Fax:([phone]) [place], [place] [address]  DIAGNOSIS: Chronic lymphocytic leukemia diagnosed in October 2014.  PRIOR THERAPY: None  CURRENT THERAPY: Observation.  INTERVAL HISTORY: Tony Marshall 75 y.o. male returns to the clinic today for routine three-month follow-up visit accompanied by his wife. The patient continues to do well with no complaints. He denied having any significant weight loss but has occasional night sweats. He has no palpable lymphadenopathy. He has no chest pain, shortness of breath, cough or hemoptysis. He has no significant nausea or vomiting. He had repeat CBC performed earlier today and his here for evaluation and discussion of his lab results.  MEDICAL HISTORY: Past Medical History:  Diagnosis Date  . CLL (chronic lymphocytic leukemia) ([place])   . Diabetes mellitus without complication ([place])   . GERD (gastroesophageal reflux disease)   . Hyperlipidemia   . Hypertension   . Lymphocytosis   . Sleep apnea     ALLERGIES:  is allergic to glycopyrrolate.  MEDICATIONS:  Current Outpatient Prescriptions  Medication Sig Dispense Refill  . aspirin 81 MG EC tablet Take 81 mg by mouth daily. Swallow whole.    . benazepril (LOTENSIN) 20 MG tablet TAKE 1 TABLET BY MOUTH ONCE DAILY 90 tablet 1  . carvedilol (COREG) 25 MG tablet TAKE 1 TABLET BY MOUTH TWICE DAILY WITH MEALS 180 tablet 1  . doxazosin (CARDURA) 4 MG tablet TAKE 1 TABLET BY MOUTH ATBEDTIME 90 tablet 1  . DUREZOL 0.05 % EMUL Place 1 drop into the right eye 2 (two) times daily.    Marland Kitchen esomeprazole (NEXIUM) 40 MG capsule Take 40 mg by mouth daily at 12 noon.    Marland Kitchen glucagon (GLUCAGON EMERGENCY) 1 MG injection Inject 1 mg into the vein once as needed. 1 each 5  . HUMALOG KWIKPEN 100 UNIT/ML KiwkPen INJECT 3-9 UNITS AT BREAKFAST, 3-4 UNITS AT LUNCH AND 6-9 UNITS AT DINNER 45 mL 0  .  Insulin Degludec (TRESIBA FLEXTOUCH) 100 UNIT/ML SOPN Inject 15 Units into the skin daily. Adjust the insulin doses as directed, start with 12 units once a day 5 pen 2  . Insulin Glargine (TOUJEO SOLOSTAR) 300 UNIT/ML SOPN Inject into the skin daily. Inject 12 units every morning    . INVOKANA 100 MG TABS tablet TAKE 1 TABLET BY MOUTH DAILY BEFORE BREAKFAST 30 tablet 3  . ONE TOUCH ULTRA TEST test strip USE ONE STRIP TO CHECK GLUCOSE THREE TIMES DAILY 100 each 3  . pioglitazone (ACTOS) 15 MG tablet TAKE 1 TABLET BY MOUTH DAILY. 90 tablet 1  . rosuvastatin (CRESTOR) 5 MG tablet Take 5 mg by mouth every morning.    . saccharomyces boulardii (FLORASTOR) 250 MG capsule Take 250 mg by mouth 2 (two) times daily.    . sildenafil (REVATIO) 20 MG tablet TAKE 5 TABLETS BY MOUTH AS NEEDED once a week. 30 tablet 2  . zolpidem (AMBIEN CR) 12.5 MG CR tablet Take 1 tablet (12.5 mg total) by mouth at bedtime as needed for sleep. 30 tablet 3   Current Facility-Administered Medications  Medication Dose Route Frequency Provider Last Rate Last Dose  . 0.9 %  sodium chloride infusion  500 mL Intravenous Continuous Jerene Bears, MD        SURGICAL HISTORY:  Past Surgical History:  Procedure Laterality Date  . BLEPHAROPLASTY    . COLONOSCOPY  2012  .  HEMORRHOID SURGERY     Sclerotherapy  . KNEE SURGERY Right 2002  . POLYPECTOMY    . SHOULDER SURGERY Right 2007    REVIEW OF SYSTEMS:  A comprehensive review of systems was negative except for: Constitutional: positive for night sweats   PHYSICAL EXAMINATION: General appearance: alert, cooperative and no distress Head: Normocephalic, without obvious abnormality, atraumatic Neck: no adenopathy, no JVD, supple, symmetrical, trachea midline and thyroid not enlarged, symmetric, no tenderness/mass/nodules Lymph nodes: Cervical, supraclavicular, and axillary nodes normal. Resp: clear to auscultation bilaterally Back: symmetric, no curvature. ROM normal. No CVA  tenderness. Cardio: regular rate and rhythm, S1, S2 normal, no murmur, click, rub or gallop GI: soft, non-tender; bowel sounds normal; no masses,  no organomegaly Extremities: extremities normal, atraumatic, no cyanosis or edema  ECOG PERFORMANCE STATUS: 0 - Asymptomatic  Blood pressure (!) 178/81, pulse (!) 57, temperature 98 F (36.7 C), temperature source Oral, resp. rate 18, weight 161 lb 14.4 oz (73.4 kg), SpO2 99 %.  LABORATORY DATA: Lab Results  Component Value Date   WBC 64.6 (HH) 11/11/2016   HGB 14.6 11/11/2016   HCT 46.3 11/11/2016   MCV 94.0 11/11/2016   PLT 225 11/11/2016      Chemistry      Component Value Date/Time   NA 138 10/08/2016 1022   NA 141 08/08/2016 1006   K 4.0 10/08/2016 1022   K 4.3 08/08/2016 1006   CL 104 10/08/2016 1022   CO2 29 10/08/2016 1022   CO2 26 08/08/2016 1006   BUN 25 (H) 10/08/2016 1022   BUN 26.8 (H) 08/08/2016 1006   CREATININE 1.17 10/08/2016 1022   CREATININE 1.3 08/08/2016 1006      Component Value Date/Time   CALCIUM 8.8 10/08/2016 1022   CALCIUM 9.3 08/08/2016 1006   ALKPHOS 42 10/08/2016 1022   ALKPHOS 49 08/08/2016 1006   AST 20 10/08/2016 1022   AST 21 08/08/2016 1006   ALT 17 10/08/2016 1022   ALT 17 08/08/2016 1006   BILITOT 0.3 10/08/2016 1022   BILITOT 0.37 08/08/2016 1006       RADIOGRAPHIC STUDIES: No results found.  ASSESSMENT AND PLAN: This is a very pleasant 75 years old white male with history of chronic lymphocytic leukemia currently on observation. His CBC today showed mild further increase in the total white blood count. The patient is currently asymptomatic.  I discussed the lab result with the patient and his wife. I recommended for him to continue on observation with repeat CBC, comprehensive metabolic panel and LDH in 3 months. He was advised to call immediately if he has any concerning symptoms in the interval. The patient voices understanding of current disease status and treatment options  and is in agreement with the current care plan.  All questions were answered. The patient knows to call the clinic with any problems, questions or concerns. We can certainly see the patient much sooner if necessary.  Disclaimer: This note was dictated with voice recognition software. Similar sounding words can inadvertently be transcribed and may not be corrected upon review.

## 2016-11-18 ENCOUNTER — Telehealth: Payer: Self-pay | Admitting: Endocrinology

## 2016-11-18 NOTE — Telephone Encounter (Signed)
Pt's wife called in and wanted to speak with the nurse about her concerns that the Pt's blood pressure has been running very high lately.

## 2016-11-18 NOTE — Telephone Encounter (Signed)
Pt said his BP has been running high lately. He went to the [facility] a few weeks ago and it was 170/80. Bought a new meter today and it was 199/84. No other numbers in-between that. He said he keeps a headache so does not think it is BP related. No dizziness.

## 2016-11-18 NOTE — Telephone Encounter (Signed)
Spoke to pt. He has some amlodipine already. He will start taking it. Will forward to Someone up front to schedule appt.

## 2016-11-18 NOTE — Telephone Encounter (Signed)
Start taking amlodipine 5 mg daily and see me in about a week

## 2016-11-27 ENCOUNTER — Encounter: Payer: Self-pay | Admitting: Endocrinology

## 2016-11-27 ENCOUNTER — Ambulatory Visit (INDEPENDENT_AMBULATORY_CARE_PROVIDER_SITE_OTHER): Payer: Medicare Other | Admitting: Endocrinology

## 2016-11-27 VITALS — BP 156/96 | Ht 67.0 in | Wt 160.0 lb

## 2016-11-27 DIAGNOSIS — I10 Essential (primary) hypertension: Secondary | ICD-10-CM | POA: Diagnosis not present

## 2016-11-27 NOTE — Patient Instructions (Addendum)
Saline spray and use Flonase spray  No Alleve  Walk daily  Take 2 amlodipine daily

## 2016-11-27 NOTE — Progress Notes (Signed)
Subjective:     Patient ID: Tony Marshall, male   DOB: 1941-01-25, 75 y.o.   MRN: [id number]  HPI   Chief complaint: High blood pressure  About 10 days ago he noticed blood pressure running higher with readings as high as 190/100 He has not brought his monitor for comparison However more recently is using a new monitor for blood pressure checking and is doing this almost everyday  His amlodipine had been stopped when he started Invokana previously and blood pressure had been subsequently fairly good as of the last visit Blood pressure was higher with oncologist last month also  In 2008 he had marked increase in blood pressure but secondary hypertension was ruled out and renal artery ultrasound was unremarkable  Recent readings at home: 149-192/79-99  On questioning the patient says that for a few weeks he has been taking Aleve every morning for headaches Also recently has been taking Afrin nasal spray because of nasal obstruction especially in the mornings Previously had been trying Flonase but he stopped this when he got better   BP Readings from Last 3 Encounters:  11/27/16 (!) 156/96  11/11/16 (!) 178/81  10/11/16 126/78    Lab Results  Component Value Date   CREATININE 1.4 (H) 11/11/2016   BUN 28.2 (H) 11/11/2016   NA 141 11/11/2016   K 5.0 11/11/2016   CL 104 10/08/2016   CO2 26 11/11/2016      Medication List       Accurate as of 11/27/16  1:29 PM. Always use your most recent med list.          aspirin 81 MG EC tablet Take 81 mg by mouth daily. Swallow whole.   benazepril 20 MG tablet Commonly known as:  LOTENSIN TAKE 1 TABLET BY MOUTH ONCE DAILY   carvedilol 25 MG tablet Commonly known as:  COREG TAKE 1 TABLET BY MOUTH TWICE DAILY WITH MEALS   doxazosin 4 MG tablet Commonly known as:  CARDURA TAKE 1 TABLET BY MOUTH ATBEDTIME   DUREZOL 0.05 % Emul Generic drug:  Difluprednate Place 1 drop into the right eye 2 (two) times daily.    esomeprazole 40 MG capsule Commonly known as:  NEXIUM Take 40 mg by mouth daily at 12 noon.   FLORASTOR 250 MG capsule Generic drug:  saccharomyces boulardii Take 250 mg by mouth 2 (two) times daily.   glucagon 1 MG injection Commonly known as:  GLUCAGON EMERGENCY Inject 1 mg into the vein once as needed.   HUMALOG KWIKPEN 100 UNIT/ML KiwkPen Generic drug:  insulin lispro INJECT 3-9 UNITS AT BREAKFAST, 3-4 UNITS AT LUNCH AND 6-9 UNITS AT DINNER   insulin degludec 100 UNIT/ML Sopn FlexTouch Pen Commonly known as:  TRESIBA FLEXTOUCH Inject 15 Units into the skin daily. Adjust the insulin doses as directed, start with 12 units once a day   INVOKANA 100 MG Tabs tablet Generic drug:  canagliflozin TAKE 1 TABLET BY MOUTH DAILY BEFORE BREAKFAST   ONE TOUCH ULTRA TEST test strip Generic drug:  glucose blood USE ONE STRIP TO CHECK GLUCOSE THREE TIMES DAILY   pioglitazone 15 MG tablet Commonly known as:  ACTOS TAKE 1 TABLET BY MOUTH DAILY.   rosuvastatin 5 MG tablet Commonly known as:  CRESTOR Take 5 mg by mouth every morning.   sildenafil 20 MG tablet Commonly known as:  REVATIO TAKE 5 TABLETS BY MOUTH AS NEEDED once a week.   TOUJEO SOLOSTAR 300 UNIT/ML Sopn Generic drug:  Insulin Glargine  Inject into the skin daily. Inject 12 units every morning   zolpidem 12.5 MG CR tablet Commonly known as:  AMBIEN CR Take 1 tablet (12.5 mg total) by mouth at bedtime as needed for sleep.        Review of Systems     Objective:   Physical Exam  BP (!) 156/96   Ht 5\' 7"  (1.702 m)   Wt 160 lb (72.6 kg)   BMI 25.06 kg/m   Repeat blood pressure was 164/88 Soft drinks renal artery bruit on the left side heart     Assessment:     Uncontrolled hypertension This may be partly related to using decongestant nasal spray and Aleve Also his recent creatinine is relatively high at 1.4 for unknown reason, possibly from using nonsteroidals or increasing renal artery stenosis       Plan:     Encouraged him to stop using Aleve and Afrin Flonase for allergic rhinitis Increase amlodipine to 10 mg for now Call if blood pressure not improved Bring blood pressure monitor for comparison on the next visit Recheck renal function next month

## 2016-12-02 ENCOUNTER — Other Ambulatory Visit: Payer: Self-pay | Admitting: Endocrinology

## 2016-12-20 ENCOUNTER — Telehealth: Payer: Self-pay | Admitting: Endocrinology

## 2016-12-20 MED ORDER — CANAGLIFLOZIN 100 MG PO TABS: ORAL_TABLET | ORAL | 3 refills | Status: DC

## 2016-12-20 NOTE — Telephone Encounter (Signed)
Refill submitted. 

## 2016-12-20 NOTE — Telephone Encounter (Signed)
Refill of INVOKANA 100 MG TABS tablet [facility] [id number] [place] [phone] ([phone] (Fax)

## 2017-01-07 ENCOUNTER — Other Ambulatory Visit (INDEPENDENT_AMBULATORY_CARE_PROVIDER_SITE_OTHER): Payer: Medicare HMO

## 2017-01-07 DIAGNOSIS — E1165 Type 2 diabetes mellitus with hyperglycemia: Secondary | ICD-10-CM

## 2017-01-07 DIAGNOSIS — Z794 Long term (current) use of insulin: Secondary | ICD-10-CM | POA: Diagnosis not present

## 2017-01-07 LAB — COMPREHENSIVE METABOLIC PANEL
ALT: 12 U/L (ref 0–53)
AST: 17 U/L (ref 0–37)
Albumin: 3.8 g/dL (ref 3.5–5.2)
Alkaline Phosphatase: 37 U/L — ABNORMAL LOW (ref 39–117)
BUN: 24 mg/dL — ABNORMAL HIGH (ref 6–23)
CALCIUM: 9.1 mg/dL (ref 8.4–10.5)
CHLORIDE: 107 meq/L (ref 96–112)
CO2: 28 meq/L (ref 19–32)
CREATININE: 1.25 mg/dL (ref 0.40–1.50)
GFR: 60.16 mL/min (ref >60.00)
Glucose, Bld: 133 mg/dL — ABNORMAL HIGH (ref 70–99)
Potassium: 3.9 mEq/L (ref 3.5–5.1)
SODIUM: 139 meq/L (ref 135–145)
Total Bilirubin: 0.4 mg/dL (ref 0.2–1.2)
Total Protein: 6.2 g/dL (ref 6.0–8.3)

## 2017-01-07 LAB — HEMOGLOBIN A1C: Hgb A1c MFr Bld: 7.3 % — ABNORMAL HIGH (ref 4.6–6.5)

## 2017-01-07 LAB — LIPID PANEL
CHOL/HDL RATIO: 4
Cholesterol: 163 mg/dL (ref 0–200)
HDL: 44 mg/dL (ref >39.00)
LDL CALC: 105 mg/dL — AB (ref 0–99)
NonHDL: 118.86
TRIGLYCERIDES: 71 mg/dL (ref 0.0–149.0)
VLDL: 14.2 mg/dL (ref 0.0–40.0)

## 2017-01-08 DIAGNOSIS — R69 Illness, unspecified: Secondary | ICD-10-CM | POA: Diagnosis not present

## 2017-01-10 ENCOUNTER — Ambulatory Visit (INDEPENDENT_AMBULATORY_CARE_PROVIDER_SITE_OTHER): Payer: Medicare HMO | Admitting: Endocrinology

## 2017-01-10 ENCOUNTER — Encounter: Payer: Self-pay | Admitting: Endocrinology

## 2017-01-10 VITALS — BP 134/68 | HR 60 | Ht 67.0 in | Wt 161.0 lb

## 2017-01-10 DIAGNOSIS — I10 Essential (primary) hypertension: Secondary | ICD-10-CM | POA: Diagnosis not present

## 2017-01-10 DIAGNOSIS — E7800 Pure hypercholesterolemia, unspecified: Secondary | ICD-10-CM | POA: Diagnosis not present

## 2017-01-10 DIAGNOSIS — E1165 Type 2 diabetes mellitus with hyperglycemia: Secondary | ICD-10-CM

## 2017-01-10 DIAGNOSIS — Z794 Long term (current) use of insulin: Secondary | ICD-10-CM | POA: Diagnosis not present

## 2017-01-10 NOTE — Progress Notes (Signed)
Patient ID: Tony Marshall, male   DOB: 02/26/1941, 76 y.o.   MRN: [id number]   Reason for Appointment: FOLLOW-up  History of Present Illness   1. Type 2 DIABETES MELITUS, date of diagnosis:  1992        His blood sugar has been  difficult to control over the last few years because of variability in his blood sugars On basal bolus insulin for a few years and his oral hypoglycemic drugs have been continued Also did not tolerate GLP-1 drugs or symlin because of nausea He is essentially looking like a a type I patient with requiring relatively low amounts of insulin and twice a day dosage of basal insulin He had done somewhat better with Levemir compared to Lantus and was using this twice a day also This was changed to Toujeo for once a day convenience  Recent history:  Insulin regimen: Toujeo insulin 12 units at 8 am.  Mealtime insulin: Breakfast = 3-4 units, 2-3 at lunch ;  supper 6-8 units based on meal size   He was started on Invokana in 4/17 His A1c has improved with this and now stable at 7.2-7.3  Recent blood sugar patterns and problems:  He is taking Toujeo in the morning and has not changed his dosage.  Although overall fasting blood sugars are relatively higher he has some good readings in the mornings also  He still has some variability in his blood sugars later in the day with lower readings before supper and sometimes higher readings after eating  Still not using carbohydrate counting for covering his evening meal and taking arbitrary doses  Still taking Invokana with lunch and appears to have the lowest reading before supper  POSTPRANDIAL readings after supper are reasonably well controlled especially recently with occasional low sugars based on his intake and only occasionally getting significantly high readings at bedtime  Occasionally will take 2 units extra at bedtime if his sugars are 180 or higher  His weight has leveled off   Hypoglycemia:  has  only occasional low blood sugars randomly midday or afternoon, rarely after supper  Oral hypoglycemic drugs: Actos, Invokana        Side effects from medications: None       Proper timing of medications in relation to meals: Yes.          Monitors blood glucose: 2.6x daily    Glucometer: One Touch.          Blood Glucose readings from download  Mean values apply above for all meters except median for One Touch  PRE-MEAL Fasting Lunch Dinner Bedtime Overall  Glucose range: 113-181  54-145 70-338   Mean/median: 158   160 144   Meals: 2 meals per day, Usually late breakfast and late evening meal  He is eating less High-fat foods and carbohydrates overall Has Breakfast at 10 am dinner at 7-8 PM     Physical activity: exercise:  walking, Using bike, less this winter    Wt Readings from Last 3 Encounters:  01/10/17 161 lb (73 kg)  11/27/16 160 lb (72.6 kg)  11/11/16 161 lb 14.4 oz (73.4 kg)         LABS:  Lab Results  Component Value Date   HGBA1C 7.3 (H) 01/07/2017   HGBA1C 7.2 (H) 10/08/2016   HGBA1C 7.3 (H) 07/08/2016   Lab Results  Component Value Date   MICROALBUR 3.5 (H) 04/05/2016   LDLCALC 105 (H) 01/07/2017   CREATININE 1.25 01/07/2017  2. Hypertension:    His blood pressure has been high for about 30 years.  He was evaluated in 12/2006 with urine metanephrines and renal artery ultrasound which were normal.    On his last visit blood pressure was significantly high and his amlodipine was increased to 10 minute and Also was told to leave off nasal  decongestants  He checks his home blood pressure regularly and this is consistently about [phone], recently using a new meter  Blood pressure better in the office today    Lab on 01/07/2017  Component Date Value Ref Range Status  . Hgb A1c MFr Bld 01/07/2017 7.3* 4.6 - 6.5 % Final  . Sodium 01/07/2017 139  135 - 145 mEq/L Final  . Potassium 01/07/2017 3.9  3.5 - 5.1 mEq/L Final  . Chloride 01/07/2017 107   96 - 112 mEq/L Final  . CO2 01/07/2017 28  19 - 32 mEq/L Final  . Glucose, Bld 01/07/2017 133* 70 - 99 mg/dL Final  . BUN 01/07/2017 24* 6 - 23 mg/dL Final  . Creatinine, Ser 01/07/2017 1.25  0.40 - 1.50 mg/dL Final  . Total Bilirubin 01/07/2017 0.4  0.2 - 1.2 mg/dL Final  . Alkaline Phosphatase 01/07/2017 37* 39 - 117 U/L Final  . AST 01/07/2017 17  0 - 37 U/L Final  . ALT 01/07/2017 12  0 - 53 U/L Final  . Total Protein 01/07/2017 6.2  6.0 - 8.3 g/dL Final  . Albumin 01/07/2017 3.8  3.5 - 5.2 g/dL Final  . Calcium 01/07/2017 9.1  8.4 - 10.5 mg/dL Final  . GFR 01/07/2017 60.16  >60.00 mL/min Final  . Cholesterol 01/07/2017 163  0 - 200 mg/dL Final  . Triglycerides 01/07/2017 71.0  0.0 - 149.0 mg/dL Final  . HDL 01/07/2017 44.00  >39.00 mg/dL Final  . VLDL 01/07/2017 14.2  0.0 - 40.0 mg/dL Final  . LDL Cholesterol 01/07/2017 105* 0 - 99 mg/dL Final  . Total CHOL/HDL Ratio 01/07/2017 4   Final  . NonHDL 01/07/2017 118.86   Final    Allergies as of 01/10/2017      Reactions   Glycopyrrolate Rash      Medication List       Accurate as of 01/10/17 11:59 PM. Always use your most recent med list.          amLODipine 10 MG tablet Commonly known as:  NORVASC TAKE 1 TABLET BY MOUTH DAILY   aspirin 81 MG EC tablet Take 81 mg by mouth daily. Swallow whole.   benazepril 20 MG tablet Commonly known as:  LOTENSIN TAKE 1 TABLET BY MOUTH ONCE DAILY   canagliflozin 100 MG Tabs tablet Commonly known as:  INVOKANA TAKE 1 TABLET BY MOUTH DAILY BEFORE BREAKFAST   carvedilol 25 MG tablet Commonly known as:  COREG TAKE 1 TABLET BY MOUTH TWICE DAILY WITH MEALS   doxazosin 4 MG tablet Commonly known as:  CARDURA TAKE 1 TABLET BY MOUTH ATBEDTIME   DUREZOL 0.05 % Emul Generic drug:  Difluprednate Place 1 drop into the right eye 2 (two) times daily.   esomeprazole 40 MG capsule Commonly known as:  NEXIUM Take 40 mg by mouth daily at 12 noon.   FLORASTOR 250 MG capsule Generic  drug:  saccharomyces boulardii Take 250 mg by mouth 2 (two) times daily.   glucagon 1 MG injection Commonly known as:  GLUCAGON EMERGENCY Inject 1 mg into the vein once as needed.   HUMALOG KWIKPEN 100 UNIT/ML KiwkPen Generic drug:  insulin lispro INJECT 3-9 UNITS AT BREAKFAST, 3-4 UNITS AT LUNCH AND 6-9 UNITS AT DINNER   ONE TOUCH ULTRA TEST test strip Generic drug:  glucose blood USE ONE STRIP TO CHECK GLUCOSE THREE TIMES DAILY   pioglitazone 15 MG tablet Commonly known as:  ACTOS TAKE 1 TABLET BY MOUTH DAILY.   rosuvastatin 5 MG tablet Commonly known as:  CRESTOR Take 5 mg by mouth every morning.   sildenafil 20 MG tablet Commonly known as:  REVATIO TAKE 5 TABLETS BY MOUTH AS NEEDED once a week.   TOUJEO SOLOSTAR 300 UNIT/ML Sopn Generic drug:  Insulin Glargine Inject into the skin daily. Inject 12 units every morning   zolpidem 12.5 MG CR tablet Commonly known as:  AMBIEN CR Take 1 tablet (12.5 mg total) by mouth at bedtime as needed for sleep.       Allergies:  Allergies  Allergen Reactions  . Glycopyrrolate Rash    Past Medical History:  Diagnosis Date  . CLL (chronic lymphocytic leukemia) ([place])   . Diabetes mellitus without complication ([place])   . GERD (gastroesophageal reflux disease)   . Hyperlipidemia   . Hypertension   . Lymphocytosis   . Sleep apnea     Past Surgical History:  Procedure Laterality Date  . BLEPHAROPLASTY    . COLONOSCOPY  2012  . HEMORRHOID SURGERY     Sclerotherapy  . KNEE SURGERY Right 2002  . POLYPECTOMY    . SHOULDER SURGERY Right 2007    Family History  Problem Relation Age of Onset  . Kidney disease Mother   . CAD Maternal Aunt   . Colon cancer Neg Hx   . Esophageal cancer Neg Hx   . Rectal cancer Neg Hx   . Stomach cancer Neg Hx     Social History:  reports that he has never smoked. He quit smokeless tobacco use about 4 years ago. His smokeless tobacco use included Chew. He reports that he drinks about 3.6  oz of alcohol per week . He reports that he does not use drugs.   ROS   HYPERLIPIDEMIA: The lipid abnormality consists of elevated LDL LDL is higher now, appears to be taking only a half of the 5 mg Crestor instead of the full tablet by mistake   Lab Results  Component Value Date   CHOL 163 01/07/2017   HDL 44.00 01/07/2017   LDLCALC 105 (H) 01/07/2017   TRIG 71.0 01/07/2017   CHOLHDL 4 01/07/2017    Last colonoscopy was in April 2012.  No results found for: OCCULTBLD    He has  CLL which is followed is by the hematologist and no treatment currently needed Despite Persistently high white count, still asymptomatic  No night sweats, weight loss, decreased appetite or weakness Does not have any anemia or thrombocytopenia  Lab Results  Component Value Date   WBC 64.6 (HH) 11/11/2016   HGB 14.6 11/11/2016   HCT 46.3 11/11/2016   MCV 94.0 11/11/2016   PLT 225 11/11/2016     Chronic insomnia present and  treated with Ambien CR   Diagnosed with sleep apnea (11/09) treated with CPAP. Sleeps better and less daytime fatigueWith continued treatment.    Examination:   BP 134/68   Pulse 60   Ht 5\' 7"  (1.702 m)   Wt 161 lb (73 kg)   SpO2 98%   BMI 25.22 kg/m   Body mass index is 25.22 kg/m.     Diabetic Foot Exam - Simple  Simple Foot Form Diabetic Foot exam was performed with the following findings:  Yes   Visual Inspection No deformities, no ulcerations, no other skin breakdown bilaterally:  Yes Sensation Testing Intact to touch and monofilament testing bilaterally:  Yes Pulse Check Posterior Tibialis and Dorsalis pulse intact bilaterally:  Yes Comments     No edema  ASSESSMENT/ PLAN:   Diabetes type 2, inadequately controlled with generally high A1c over 8% See history of present illness for detailed discussion of his current management, blood sugar patterns and problems identified  With  Invokana his blood sugars are consistently well controlled with  A1c stable at 7.2-7.3 He has relatively low reading before supper and sometimes higher readings after supper based on his diet Otherwise blood sugar fluctuation has been less with adding Invokana including in the fasting state Although fasting readings averaging 150-160 they are not consistently high He still feels uncomfortable with fear of hypoglycemia overnight Lowest reading before supper, not care of this is an effect of taking Invokana at a light lunch  Foot exam shows no neuropathy  HYPERTENSION: Well controlled recently His office readings are better today but his home monitor for some reason was reading markedly increased reading even though it had been better at home, he will continue to use the same monitor  Hyperlipidemia: LDL is over 100 Most likely he is taking only half of a 5 mg Crestor instead of the full tablet He will check what he is doing and let us know Will need follow-up lipids on the next visit  Recommendations:  No change in Toujeo unless fasting readings start getting higher  Start trying to take Invokana at suppertime which will help readings after supper and possibly reduce low sugars before eating in the evening  Consistent exercise  Continue to adjust suppertime coverage based on meal size and carbohydrate intake  Bedtime snack if blood sugars are low normal  Also discussed possibility of using the freestyle [place] system and he will need to check whether he has coverage for this  Stay on Invokana as this has benefited him  No change in blood pressure medications  There are no Patient Instructions on file for this visit.  I   Counseling time on subjects discussed above is over 50% of today's 25 minute visit    Alban Marucci 01/12/2017, 2:01 PM

## 2017-01-15 ENCOUNTER — Other Ambulatory Visit: Payer: Self-pay

## 2017-01-15 MED ORDER — ZOLPIDEM TARTRATE ER 12.5 MG PO TBCR: 12.5000 mg | EXTENDED_RELEASE_TABLET | Freq: Every evening | ORAL | 3 refills | Status: DC | PRN

## 2017-01-16 NOTE — Telephone Encounter (Signed)
Pt needs the sleeping pill called in please to [facility], upon review of the record this has already been called in.

## 2017-01-20 ENCOUNTER — Telehealth: Payer: Self-pay | Admitting: Endocrinology

## 2017-01-20 ENCOUNTER — Other Ambulatory Visit: Payer: Self-pay

## 2017-01-20 MED ORDER — ZOLPIDEM TARTRATE ER 12.5 MG PO TBCR: 12.5000 mg | EXTENDED_RELEASE_TABLET | Freq: Every evening | ORAL | 3 refills | Status: DC | PRN

## 2017-01-20 NOTE — Telephone Encounter (Signed)
Patient need refill of medication,zolpidem (AMBIEN CR) 12.5 MG CR tablet  [facility] [address], [place] [phone] (Phone) [phone] (Fax)

## 2017-01-20 NOTE — Telephone Encounter (Signed)
Ordered

## 2017-02-10 ENCOUNTER — Other Ambulatory Visit (HOSPITAL_BASED_OUTPATIENT_CLINIC_OR_DEPARTMENT_OTHER): Payer: Medicare HMO

## 2017-02-10 ENCOUNTER — Telehealth: Payer: Self-pay | Admitting: Internal Medicine

## 2017-02-10 ENCOUNTER — Encounter: Payer: Self-pay | Admitting: Internal Medicine

## 2017-02-10 ENCOUNTER — Ambulatory Visit (HOSPITAL_BASED_OUTPATIENT_CLINIC_OR_DEPARTMENT_OTHER): Payer: Medicare HMO | Admitting: Internal Medicine

## 2017-02-10 VITALS — BP 144/77 | HR 57 | Temp 98.4°F | Resp 18 | Ht 67.0 in | Wt 163.3 lb

## 2017-02-10 DIAGNOSIS — D72820 Lymphocytosis (symptomatic): Secondary | ICD-10-CM

## 2017-02-10 DIAGNOSIS — C9110 Chronic lymphocytic leukemia of B-cell type not having achieved remission: Secondary | ICD-10-CM | POA: Diagnosis not present

## 2017-02-10 DIAGNOSIS — D72829 Elevated white blood cell count, unspecified: Secondary | ICD-10-CM

## 2017-02-10 LAB — CBC WITH DIFFERENTIAL/PLATELET
BASO%: 0.1 % (ref 0.0–2.0)
BASOS ABS: 0.1 10*3/uL (ref 0.0–0.1)
EOS ABS: 0.2 10*3/uL (ref 0.0–0.5)
EOS%: 0.3 % (ref 0.0–7.0)
HEMATOCRIT: 38.7 % (ref 38.4–49.9)
HGB: 12.4 g/dL — ABNORMAL LOW (ref 13.0–17.1)
LYMPH#: 64.2 10*3/uL — AB (ref 0.9–3.3)
LYMPH%: 91.6 % — AB (ref 14.0–49.0)
MCH: 30.4 pg (ref 27.2–33.4)
MCHC: 32.2 g/dL (ref 32.0–36.0)
MCV: 94.5 fL (ref 79.3–98.0)
MONO#: 1.2 10*3/uL — AB (ref 0.1–0.9)
MONO%: 1.8 % (ref 0.0–14.0)
NEUT#: 4.4 10*3/uL (ref 1.5–6.5)
NEUT%: 6.2 % — AB (ref 39.0–75.0)
PLATELETS: 187 10*3/uL (ref 140–400)
RBC: 4.09 10*6/uL — AB (ref 4.20–5.82)
RDW: 14.7 % — ABNORMAL HIGH (ref 11.0–14.6)
WBC: 70.2 10*3/uL (ref 4.0–10.3)

## 2017-02-10 LAB — TECHNOLOGIST REVIEW

## 2017-02-10 LAB — COMPREHENSIVE METABOLIC PANEL
ALT: 13 U/L (ref 0–55)
ANION GAP: 6 meq/L (ref 3–11)
AST: 18 U/L (ref 5–34)
Albumin: 3.9 g/dL (ref 3.5–5.0)
Alkaline Phosphatase: 44 U/L (ref 40–150)
BILIRUBIN TOTAL: 0.39 mg/dL (ref 0.20–1.20)
BUN: 28.1 mg/dL — ABNORMAL HIGH (ref 7.0–26.0)
CALCIUM: 9.5 mg/dL (ref 8.4–10.4)
CHLORIDE: 108 meq/L (ref 98–109)
CO2: 28 meq/L (ref 22–29)
CREATININE: 1.4 mg/dL — AB (ref 0.7–1.3)
EGFR: 52 mL/min/{1.73_m2} — AB (ref >90)
Glucose: 123 mg/dl (ref 70–140)
Potassium: 4.2 mEq/L (ref 3.5–5.1)
Sodium: 141 mEq/L (ref 136–145)
TOTAL PROTEIN: 6.5 g/dL (ref 6.4–8.3)

## 2017-02-10 LAB — LACTATE DEHYDROGENASE: LDH: 234 U/L (ref 125–245)

## 2017-02-10 NOTE — Progress Notes (Signed)
[place] [phone]   Fax:([phone]) [place], [place] [id number]  DIAGNOSIS: Chronic lymphocytic leukemia diagnosed in October 2014.  PRIOR THERAPY: None  CURRENT THERAPY: Observation.  INTERVAL HISTORY: Tony Marshall 76 y.o. male came to the clinic today for follow-up visit accompanied by his wife. The patient has no significant change since his last visit. He denied having any weight loss or night sweats. He has no chest pain, shortness breath, cough or hemoptysis. He has no bleeding issues. He denied having any nausea or vomiting. He has no palpable lymphadenopathy. He had repeat CBC earlier today and he is here for evaluation and discussion of his lab results.  MEDICAL HISTORY: Past Medical History:  Diagnosis Date  . CLL (chronic lymphocytic leukemia) ([place])   . Diabetes mellitus without complication ([place])   . GERD (gastroesophageal reflux disease)   . Hyperlipidemia   . Hypertension   . Lymphocytosis   . Sleep apnea     ALLERGIES:  is allergic to glycopyrrolate.  MEDICATIONS:  Current Outpatient Prescriptions  Medication Sig Dispense Refill  . amLODipine (NORVASC) 10 MG tablet TAKE 1 TABLET BY MOUTH DAILY 90 tablet 1  . aspirin 81 MG EC tablet Take 81 mg by mouth daily. Swallow whole.    . benazepril (LOTENSIN) 20 MG tablet TAKE 1 TABLET BY MOUTH ONCE DAILY 90 tablet 1  . canagliflozin (INVOKANA) 100 MG TABS tablet TAKE 1 TABLET BY MOUTH DAILY BEFORE BREAKFAST 30 tablet 3  . carvedilol (COREG) 25 MG tablet TAKE 1 TABLET BY MOUTH TWICE DAILY WITH MEALS 180 tablet 1  . doxazosin (CARDURA) 4 MG tablet TAKE 1 TABLET BY MOUTH ATBEDTIME 90 tablet 1  . DUREZOL 0.05 % EMUL Place 1 drop into the right eye 2 (two) times daily.    Marland Kitchen esomeprazole (NEXIUM) 40 MG capsule Take 40 mg by mouth daily at 12 noon.    Marland Kitchen glucagon (GLUCAGON EMERGENCY) 1 MG injection Inject 1 mg into the vein  once as needed. 1 each 5  . HUMALOG KWIKPEN 100 UNIT/ML KiwkPen INJECT 3-9 UNITS AT BREAKFAST, 3-4 UNITS AT LUNCH AND 6-9 UNITS AT DINNER 45 mL 0  . Insulin Glargine (TOUJEO SOLOSTAR) 300 UNIT/ML SOPN Inject into the skin daily. Inject 12 units every morning    . ONE TOUCH ULTRA TEST test strip USE ONE STRIP TO CHECK GLUCOSE THREE TIMES DAILY 100 each 3  . pioglitazone (ACTOS) 15 MG tablet TAKE 1 TABLET BY MOUTH DAILY. 90 tablet 1  . rosuvastatin (CRESTOR) 5 MG tablet Take 5 mg by mouth every morning.    . saccharomyces boulardii (FLORASTOR) 250 MG capsule Take 250 mg by mouth 2 (two) times daily.    . sildenafil (REVATIO) 20 MG tablet TAKE 5 TABLETS BY MOUTH AS NEEDED once a week. 30 tablet 2  . zolpidem (AMBIEN CR) 12.5 MG CR tablet Take 1 tablet (12.5 mg total) by mouth at bedtime as needed for sleep. 30 tablet 3   Current Facility-Administered Medications  Medication Dose Route Frequency Provider Last Rate Last Dose  . 0.9 %  sodium chloride infusion  500 mL Intravenous Continuous Jerene Bears, MD        SURGICAL HISTORY:  Past Surgical History:  Procedure Laterality Date  . BLEPHAROPLASTY    . COLONOSCOPY  2012  . HEMORRHOID SURGERY     Sclerotherapy  . KNEE SURGERY Right 2002  .  POLYPECTOMY    . SHOULDER SURGERY Right 2007    REVIEW OF SYSTEMS:  A comprehensive review of systems was negative.   PHYSICAL EXAMINATION: General appearance: alert, cooperative and no distress Head: Normocephalic, without obvious abnormality, atraumatic Neck: no adenopathy, no JVD, supple, symmetrical, trachea midline and thyroid not enlarged, symmetric, no tenderness/mass/nodules Lymph nodes: Cervical, supraclavicular, and axillary nodes normal. Resp: clear to auscultation bilaterally Back: symmetric, no curvature. ROM normal. No CVA tenderness. Cardio: regular rate and rhythm, S1, S2 normal, no murmur, click, rub or gallop GI: soft, non-tender; bowel sounds normal; no masses,  no  organomegaly Extremities: extremities normal, atraumatic, no cyanosis or edema  ECOG PERFORMANCE STATUS: 0 - Asymptomatic  There were no vitals taken for this visit.  LABORATORY DATA: Lab Results  Component Value Date   WBC 70.2 (HH) 02/10/2017   HGB 12.4 (L) 02/10/2017   HCT 38.7 02/10/2017   MCV 94.5 02/10/2017   PLT 187 02/10/2017      Chemistry      Component Value Date/Time   NA 139 01/07/2017 1008   NA 141 11/11/2016 1321   K 3.9 01/07/2017 1008   K 5.0 11/11/2016 1321   CL 107 01/07/2017 1008   CO2 28 01/07/2017 1008   CO2 26 11/11/2016 1321   BUN 24 (H) 01/07/2017 1008   BUN 28.2 (H) 11/11/2016 1321   CREATININE 1.25 01/07/2017 1008   CREATININE 1.4 (H) 11/11/2016 1321      Component Value Date/Time   CALCIUM 9.1 01/07/2017 1008   CALCIUM 9.8 11/11/2016 1321   ALKPHOS 37 (L) 01/07/2017 1008   ALKPHOS 51 11/11/2016 1321   AST 17 01/07/2017 1008   AST 18 11/11/2016 1321   ALT 12 01/07/2017 1008   ALT 14 11/11/2016 1321   BILITOT 0.4 01/07/2017 1008   BILITOT 0.39 11/11/2016 1321       RADIOGRAPHIC STUDIES: No results found.  ASSESSMENT AND PLAN:  This is a very pleasant 76 years old white male with chronic lymphocytic leukemia. He is currently on observation with no treatment. His CBC today showed mild increase in his total white blood count but fairly stable compared to 6 months ago. His hemoglobin is also down today. I discussed the lab result with the patient and his wife. I recommended for him to continue on observation for now with repeat CBC, comprehensive metabolic panel and LDH in 3 months. He was advised to call immediately if he has any concerning symptoms in the interval including any significant weight loss or night sweats, fever or chills. The patient voices understanding of current disease status and treatment options and is in agreement with the current care plan.  All questions were answered. The patient knows to call the clinic with any  problems, questions or concerns. We can certainly see the patient much sooner if necessary. I spent 10 minutes counseling the patient face to face. The total time spent in the appointment was 15 minutes.  Disclaimer: This note was dictated with voice recognition software. Similar sounding words can inadvertently be transcribed and may not be corrected upon review.

## 2017-02-10 NOTE — Telephone Encounter (Signed)
Appointments scheduled per 2/26 LOS. Patient given AVS report and calendars with future scheduled appointments. °

## 2017-03-06 DIAGNOSIS — H11041 Peripheral pterygium, stationary, right eye: Secondary | ICD-10-CM | POA: Diagnosis not present

## 2017-03-06 DIAGNOSIS — H02403 Unspecified ptosis of bilateral eyelids: Secondary | ICD-10-CM | POA: Diagnosis not present

## 2017-03-06 DIAGNOSIS — R69 Illness, unspecified: Secondary | ICD-10-CM | POA: Diagnosis not present

## 2017-03-06 DIAGNOSIS — E113313 Type 2 diabetes mellitus with moderate nonproliferative diabetic retinopathy with macular edema, bilateral: Secondary | ICD-10-CM | POA: Diagnosis not present

## 2017-03-06 DIAGNOSIS — H5213 Myopia, bilateral: Secondary | ICD-10-CM | POA: Diagnosis not present

## 2017-03-06 DIAGNOSIS — H524 Presbyopia: Secondary | ICD-10-CM | POA: Diagnosis not present

## 2017-03-06 DIAGNOSIS — H26493 Other secondary cataract, bilateral: Secondary | ICD-10-CM | POA: Diagnosis not present

## 2017-03-06 DIAGNOSIS — H52223 Regular astigmatism, bilateral: Secondary | ICD-10-CM | POA: Diagnosis not present

## 2017-03-10 DIAGNOSIS — R69 Illness, unspecified: Secondary | ICD-10-CM | POA: Diagnosis not present

## 2017-03-17 ENCOUNTER — Other Ambulatory Visit: Payer: Self-pay | Admitting: Endocrinology

## 2017-03-25 DIAGNOSIS — R69 Illness, unspecified: Secondary | ICD-10-CM | POA: Diagnosis not present

## 2017-03-26 ENCOUNTER — Other Ambulatory Visit: Payer: Self-pay | Admitting: Endocrinology

## 2017-04-07 ENCOUNTER — Other Ambulatory Visit (INDEPENDENT_AMBULATORY_CARE_PROVIDER_SITE_OTHER): Payer: Medicare HMO

## 2017-04-07 DIAGNOSIS — E1165 Type 2 diabetes mellitus with hyperglycemia: Secondary | ICD-10-CM | POA: Diagnosis not present

## 2017-04-07 DIAGNOSIS — Z794 Long term (current) use of insulin: Secondary | ICD-10-CM

## 2017-04-07 LAB — COMPREHENSIVE METABOLIC PANEL
ALK PHOS: 37 U/L — AB (ref 39–117)
ALT: 14 U/L (ref 0–53)
AST: 22 U/L (ref 0–37)
Albumin: 4 g/dL (ref 3.5–5.2)
BUN: 22 mg/dL (ref 6–23)
CO2: 28 mEq/L (ref 19–32)
CREATININE: 1.06 mg/dL (ref 0.40–1.50)
Calcium: 9 mg/dL (ref 8.4–10.5)
Chloride: 108 mEq/L (ref 96–112)
GFR: 72.71 mL/min (ref >60.00)
GLUCOSE: 134 mg/dL — AB (ref 70–99)
POTASSIUM: 3.7 meq/L (ref 3.5–5.1)
SODIUM: 142 meq/L (ref 135–145)
TOTAL PROTEIN: 6.1 g/dL (ref 6.0–8.3)
Total Bilirubin: 0.4 mg/dL (ref 0.2–1.2)

## 2017-04-07 LAB — LIPID PANEL
CHOLESTEROL: 136 mg/dL (ref 0–200)
HDL: 46 mg/dL (ref >39.00)
LDL CALC: 78 mg/dL (ref 0–99)
NonHDL: 90.21
TRIGLYCERIDES: 63 mg/dL (ref 0.0–149.0)
Total CHOL/HDL Ratio: 3
VLDL: 12.6 mg/dL (ref 0.0–40.0)

## 2017-04-07 LAB — MICROALBUMIN / CREATININE URINE RATIO
Creatinine,U: 43.7 mg/dL
Microalb Creat Ratio: 2.1 mg/g (ref 0.0–30.0)
Microalb, Ur: 0.9 mg/dL (ref 0.0–1.9)

## 2017-04-07 LAB — URINALYSIS, ROUTINE W REFLEX MICROSCOPIC
BILIRUBIN URINE: NEGATIVE
HGB URINE DIPSTICK: NEGATIVE
Ketones, ur: NEGATIVE
LEUKOCYTES UA: NEGATIVE
NITRITE: NEGATIVE
PH: 6 (ref 5.0–8.0)
RBC / HPF: NONE SEEN (ref 0–?)
Specific Gravity, Urine: 1.015 (ref 1.000–1.030)
Total Protein, Urine: NEGATIVE
UROBILINOGEN UA: 0.2 (ref 0.0–1.0)
Urine Glucose: >=1000 — AB
WBC UA: NONE SEEN (ref 0–?)

## 2017-04-07 LAB — HEMOGLOBIN A1C: Hgb A1c MFr Bld: 6.9 % — ABNORMAL HIGH (ref 4.6–6.5)

## 2017-04-08 ENCOUNTER — Other Ambulatory Visit: Payer: Self-pay | Admitting: Endocrinology

## 2017-04-08 DIAGNOSIS — R69 Illness, unspecified: Secondary | ICD-10-CM | POA: Diagnosis not present

## 2017-04-09 ENCOUNTER — Other Ambulatory Visit: Payer: Self-pay | Admitting: Endocrinology

## 2017-04-10 ENCOUNTER — Encounter: Payer: Self-pay | Admitting: Endocrinology

## 2017-04-10 ENCOUNTER — Other Ambulatory Visit: Payer: Self-pay

## 2017-04-10 ENCOUNTER — Ambulatory Visit (INDEPENDENT_AMBULATORY_CARE_PROVIDER_SITE_OTHER): Payer: Medicare HMO | Admitting: Endocrinology

## 2017-04-10 VITALS — BP 138/68 | HR 68 | Temp 98.8°F | Ht 67.0 in | Wt 162.8 lb

## 2017-04-10 DIAGNOSIS — E1165 Type 2 diabetes mellitus with hyperglycemia: Secondary | ICD-10-CM | POA: Diagnosis not present

## 2017-04-10 DIAGNOSIS — I10 Essential (primary) hypertension: Secondary | ICD-10-CM | POA: Diagnosis not present

## 2017-04-10 DIAGNOSIS — F5104 Psychophysiologic insomnia: Secondary | ICD-10-CM | POA: Diagnosis not present

## 2017-04-10 DIAGNOSIS — Z794 Long term (current) use of insulin: Secondary | ICD-10-CM | POA: Diagnosis not present

## 2017-04-10 DIAGNOSIS — E7800 Pure hypercholesterolemia, unspecified: Secondary | ICD-10-CM

## 2017-04-10 DIAGNOSIS — R69 Illness, unspecified: Secondary | ICD-10-CM | POA: Diagnosis not present

## 2017-04-10 DIAGNOSIS — Z0000 Encounter for general adult medical examination without abnormal findings: Secondary | ICD-10-CM | POA: Diagnosis not present

## 2017-04-10 MED ORDER — INSULIN ASPART 100 UNIT/ML FLEXPEN: PEN_INJECTOR | SUBCUTANEOUS | 3 refills | Status: DC

## 2017-04-10 NOTE — Progress Notes (Signed)
Patient ID: Tony Marshall, male   DOB: [birth year], 76 y.o.   MRN: [id number]   Reason for Appointment: Annual physical exam and follow-up of various conditions  History of Present Illness   1. Type 2 DIABETES MELITUS, date of diagnosis:  1992        His blood sugar has been  difficult to control over the last few years because of variability in his blood sugars On basal bolus insulin for a few years and his oral hypoglycemic drugs have been continued Also did not tolerate GLP-1 drugs or symlin because of nausea He is essentially looking like a a type I patient with requiring relatively low amounts of insulin and twice a day dosage of basal insulin He has done somewhat better with Levemir compared to Lantus and is using this twice a day also  He has also had overall much better controlled with adding Invokana in 07/2016  Recent history:  Insulin regimen: Toujeo insulin 12 units am.  Mealtime insulin: Breakfast = 4 units, lunch 0-4  supper 9-10 units based on meal size   His A1c is better than usual at 6.9, previously 7.3  Recent blood sugar patterns and problems:  He has tried taking Toujeo both morning and at nighttime over the last year and appears to have higher readings in the mornings when taking the injection in the morning but previously had higher readings at suppertime with taking the night dose  He is checking his blood sugars mostly before and after breakfast and after supper  However recently is getting relatively high readings fasting compared to a couple of weeks ago  Blood sugars AFTER his evening meal are excellent with a sporadic high reading based on his diet but also has had one low sugar around 1 AM  Blood sugars may not be necessarily high after breakfast but does not do pairs of readings  He does have a couple of high readings after lunch but checking these infrequently  HYPOGLYCEMIA has occurred only once as documented but he thinks it may  occur more in the afternoon if he is more active   Hypoglycemia:  as above  Oral hypoglycemic drugs: Actos, Invokana        Side effects from medications: None       Proper timing of medications in relation to meals: Yes.          Monitors blood glucose: QD    Glucometer: One Touch.          Blood Glucose readings from one touch zoom software download:   Mean values apply above for all meters except median for One Touch  PRE-MEAL Fasting Lunch Dinner Bedtime Overall  Glucose range: 112-208   77   48-244    Mean/median: 142  145   140  148    POST-MEAL PC Breakfast PC Lunch PC Dinner  Glucose range:     Mean/median: 99-253  188, 268      Meals: 2 meals per day. Snack or lunch   He is eating less fried and carbohydrates overall   Has Bfst at 10 am dinner at 7-8 PM     Physical activity: exercise:  gardening     Wt Readings from Last 3 Encounters:  04/10/17 162 lb 12.8 oz (73.8 kg)  02/10/17 163 lb 4.8 oz (74.1 kg)  01/10/17 161 lb (73 kg)           LABS:  Lab Results  Component Value Date   HGBA1C 6.9 (H) 04/07/2017   HGBA1C 7.3 (H) 01/07/2017   HGBA1C 7.2 (H) 10/08/2016   Lab Results  Component Value Date   MICROALBUR 0.9 04/07/2017   LDLCALC 78 04/07/2017   CREATININE 1.06 04/07/2017    2. Hypertension:    His blood pressure has been high for about 30 years.  He was evaluated in 1/08 with urine metanephrines and renal artery ultrasound.    Recent home blood pressure about 130-140/70, checking periodically.  He thinks his diastolic blood pressure may be lower in the afternoon at times and not clear if he feels a little lightheaded at this time Instead of taking 10 mg amlodipine he thinks he is taking three quarters of a tablet and he feels better with this Also several other medications which he takes in the morning, taking carvedilol twice a day    3. PREVENTIVE CARE:  Annual hemoccults:           due In 8/18    Zostavax:                             2012    Colonoscopy/sigmoidoscopy  2017   Flu vaccine yes  PSA:  not indicated   Pneumovax: Prevnar:   2000 2015  Eye exams:  annual   Aspirin: Yes    Exercise: Gardening, some walking when playing golf  Aspirin use: Takes 81 mg daily Diet: Usually relatively low fat Use of seatbelts and smoke detectors: Yes   Lab on 04/07/2017  Component Date Value Ref Range Status  . Hgb A1c MFr Bld 04/07/2017 6.9* 4.6 - 6.5 % Final  . Sodium 04/07/2017 142  135 - 145 mEq/L Final  . Potassium 04/07/2017 3.7  3.5 - 5.1 mEq/L Final  . Chloride 04/07/2017 108  96 - 112 mEq/L Final  . CO2 04/07/2017 28  19 - 32 mEq/L Final  . Glucose, Bld 04/07/2017 134* 70 - 99 mg/dL Final  . BUN 04/07/2017 22  6 - 23 mg/dL Final  . Creatinine, Ser 04/07/2017 1.06  0.40 - 1.50 mg/dL Final  . Total Bilirubin 04/07/2017 0.4  0.2 - 1.2 mg/dL Final  . Alkaline Phosphatase 04/07/2017 37* 39 - 117 U/L Final  . AST 04/07/2017 22  0 - 37 U/L Final  . ALT 04/07/2017 14  0 - 53 U/L Final  . Total Protein 04/07/2017 6.1  6.0 - 8.3 g/dL Final  . Albumin 04/07/2017 4.0  3.5 - 5.2 g/dL Final  . Calcium 04/07/2017 9.0  8.4 - 10.5 mg/dL Final  . GFR 04/07/2017 72.71  >60.00 mL/min Final  . Microalb, Ur 04/07/2017 0.9  0.0 - 1.9 mg/dL Final  . Creatinine,U 04/07/2017 43.7  mg/dL Final  . Microalb Creat Ratio 04/07/2017 2.1  0.0 - 30.0 mg/g Final  . Cholesterol 04/07/2017 136  0 - 200 mg/dL Final  . Triglycerides 04/07/2017 63.0  0.0 - 149.0 mg/dL Final  . HDL 04/07/2017 46.00  >39.00 mg/dL Final  . VLDL 04/07/2017 12.6  0.0 - 40.0 mg/dL Final  . LDL Cholesterol 04/07/2017 78  0 - 99 mg/dL Final  . Total CHOL/HDL Ratio 04/07/2017 3   Final  . NonHDL 04/07/2017 90.21   Final  . Color, Urine 04/07/2017 YELLOW  Yellow;Lt. Yellow Final  . APPearance 04/07/2017 CLEAR  Clear Final  . Specific Gravity, Urine 04/07/2017 1.015  1.000 - 1.030 Final  . pH 04/07/2017  6.0  5.0 - 8.0 Final  . Total Protein, Urine 04/07/2017 NEGATIVE  Negative  Final  . Urine Glucose 04/07/2017 >=1000* Negative Final  . Ketones, ur 04/07/2017 NEGATIVE  Negative Final  . Bilirubin Urine 04/07/2017 NEGATIVE  Negative Final  . Hgb urine dipstick 04/07/2017 NEGATIVE  Negative Final  . Urobilinogen, UA 04/07/2017 0.2  0.0 - 1.0 Final  . Leukocytes, UA 04/07/2017 NEGATIVE  Negative Final  . Nitrite 04/07/2017 NEGATIVE  Negative Final  . WBC, UA 04/07/2017 none seen  0-2/hpf Final  . RBC / HPF 04/07/2017 none seen  0-2/hpf Final    Allergies as of 04/10/2017      Reactions   Glycopyrrolate Rash      Medication List       Accurate as of 04/10/17  2:50 PM. Always use your most recent med list.          amLODipine 10 MG tablet Commonly known as:  NORVASC TAKE 1 TABLET BY MOUTH DAILY   aspirin 81 MG EC tablet Take 81 mg by mouth daily. Swallow whole.   benazepril 20 MG tablet Commonly known as:  LOTENSIN TAKE 1 TABLET BY MOUTH ONCE DAILY   canagliflozin 100 MG Tabs tablet Commonly known as:  INVOKANA TAKE 1 TABLET BY MOUTH DAILY BEFORE BREAKFAST   carvedilol 25 MG tablet Commonly known as:  COREG TAKE 1 TABLET BY MOUTH TWICE DAILY WITH MEALS   doxazosin 4 MG tablet Commonly known as:  CARDURA TAKE 1 TABLET BY MOUTH AT BEDTIME   DUREZOL 0.05 % Emul Generic drug:  Difluprednate Place 1 drop into the right eye 2 (two) times daily.   esomeprazole 40 MG capsule Commonly known as:  NEXIUM Take 40 mg by mouth daily at 12 noon.   FLORASTOR 250 MG capsule Generic drug:  saccharomyces boulardii Take 250 mg by mouth 2 (two) times daily.   glucagon 1 MG injection Commonly known as:  GLUCAGON EMERGENCY Inject 1 mg into the vein once as needed.   HUMALOG KWIKPEN 100 UNIT/ML KiwkPen Generic drug:  insulin lispro INJECT 3-9 UNITS AT BREAKFAST, 3-4 UNITS AT LUNCH AND 6-9 UNITS AT DINNER   ONE TOUCH ULTRA TEST test strip Generic drug:  glucose blood USE ONE STRIP TO CHECK GLUCOSE THREE TIMES DAILY   pioglitazone 15 MG  tablet Commonly known as:  ACTOS TAKE 1 TABLET BY MOUTH DAILY.   rosuvastatin 5 MG tablet Commonly known as:  CRESTOR Take 5 mg by mouth every morning.   sildenafil 20 MG tablet Commonly known as:  REVATIO TAKE 5 TABLETS BY MOUTH AS NEEDED once a week.   TOUJEO SOLOSTAR 300 UNIT/ML Sopn Generic drug:  Insulin Glargine Inject into the skin daily. Inject 12 units every morning   zolpidem 12.5 MG CR tablet Commonly known as:  AMBIEN CR Take 1 tablet (12.5 mg total) by mouth at bedtime as needed for sleep.       Allergies:  Allergies  Allergen Reactions  . Glycopyrrolate Rash    Past Medical History:  Diagnosis Date  . CLL (chronic lymphocytic leukemia) ([place])   . Diabetes mellitus without complication ([place])   . GERD (gastroesophageal reflux disease)   . Hyperlipidemia   . Hypertension   . Lymphocytosis   . Sleep apnea     Past Surgical History:  Procedure Laterality Date  . BLEPHAROPLASTY    . COLONOSCOPY  2012  . HEMORRHOID SURGERY     Sclerotherapy  . KNEE SURGERY Right 2002  . POLYPECTOMY    .  SHOULDER SURGERY Right 2007    Family History  Problem Relation Age of Onset  . Kidney disease Mother   . CAD Maternal Aunt   . Colon cancer Neg Hx   . Esophageal cancer Neg Hx   . Rectal cancer Neg Hx   . Stomach cancer Neg Hx     Social History:  reports that he has never smoked. He quit smokeless tobacco use about 4 years ago. His smokeless tobacco use included Chew. He reports that he drinks about 3.6 oz of alcohol per week . He reports that he does not use drugs.  Review of Systems:  Constitutional: Negative for weight loss  Fatigue: He says that he gets tired more easily when he is trying to be physically active.  Also he has somewhat less motivation but does not think he has any depression.  HENT: Negative for hearing loss.   Eyes:  normal vision.  has been followed by a retina specialist for unknown reason, not related to diabetic retinopathy  apparently  Respiratory: Negative for cough and shortness of breath.   Cardiovascular: Negative for chest pain, palpitations, claudication and leg swelling.  Gastrointestinal: Negative for abdominal pain and constipation.  does have history of reflux, previously given PPI drugs  Genitourinary: Negative for urgency, or reduced stream and frequency.       Has erectile dysfunction, long-standing and has fair results with using Sildenafil 100 mg Usually no nocturia Musculoskeletal: Negative for myalgias.       Pain in knees occurring at times Neurological: No significant numbness or sharp pains in his feet  Negative for dizziness No headaches.       Rare tingling in feet with tight shoes  Psychiatric/Behavioral: Negative for depression and memory loss. The patient has insomnia.        Takes Ambien every night for sleep, has chronic insomnia , some hangover occurring with the Ambien CR but previously with the regular Ambien he would have late insomnia   HYPERLIPIDEMIA: The lipid abnormality consists of elevated LDL which is very well controlled with 10mg  Crestor His dose was increased in January  Lab Results  Component Value Date   CHOL 136 04/07/2017   HDL 46.00 04/07/2017   LDLCALC 78 04/07/2017   TRIG 63.0 04/07/2017   CHOLHDL 3 04/07/2017    Last colonoscopy was in 07/2016    He has  CLL which is followed is by the hematologist and no treatment currently needed recent WBC count is 70,000 Currently denies any night sweats, weight loss, decreased appetite or weakness Does not have any significant anemia or thrombocytopenia   Diagnosed with sleep apnea (11/09) treated with CPAP, off treatment for 1 year  He says that he was not tolerating the CPAP very well and stopped using it, does not think this made him feel more tired However does have some symptoms of fatigue, he will tend to be sleepy few sitting still but not otherwise Does not think he has any snoring as before     Examination:   BP 138/68 (BP Location: Left Arm, Patient Position: Sitting, Cuff Size: Normal)   Pulse 68   Temp 98.8 F (37.1 C) (Oral)   Ht 5\' 7"  (1.702 m)   Wt 162 lb 12.8 oz (73.8 kg)   SpO2 97%   BMI 25.50 kg/m   Body mass index is 25.5 kg/m.   GENERAL:  Average build.  No pallor, clubbing or edema.    Skin:  no rash or excessive dryness  EYES:  Externally normal.  Fundii:  normal discs and vessels, fundi not completely visible.  ENT: . Oral cavity & pharynx normal.  Somewhat small oropharyngeal orifice    NECK: No lymphadenopathy.   Carotids: no bruit. THYROID:  Not palpable.  HEART:  Normal apex, S1 and S2; no murmur or click.  CHEST: He has mild gynecomastia bilaterally  Lungs:  Vescicular breath sounds heard equally.  No crepitations/ wheeze.  ABDOMEN:  No abnormal pulsations No renal artery bruit heard   Liver/spleen not palpable.  No other mass or tenderness.     RECTAL exam: No mass  Prostate:  No nodule felt; 1+ smooth enlargement.    NEUROLOGICAL:  Reflexes: absent at ankles; 1+ at biceps  Diabetic Foot Exam - Simple   Simple Foot Form Diabetic Foot exam was performed with the following findings:  Yes 04/10/2017  1:54 PM  Visual Inspection No deformities, no ulcerations, no other skin breakdown bilaterally:  Yes Sensation Testing Intact to touch and monofilament testing bilaterally:  Yes Pulse Check Posterior Tibialis and Dorsalis pulse intact bilaterally:  Yes Comments     Vibration mildly reduced at toes.  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  No swelling or deformity of other joints.   ASSESSMENT/ PLAN:   Diabetes type 2,Nonobese See history of present illness for detailed discussion of his current management, blood sugar patterns and problems identified  His A1c is 6.9 which is the best he has had in quite some time Currently on basal bolus insulin regimen with taking Toujeo in the morning; however getting higher readings  fasting recently but also reporting tendency to low sugars before supper when he is not monitoring much Has less frequent postprandial hyperglycemia after evening meal but does occasionally checked readings after breakfast which are also variable Using Humalog based on meal size but not carbohydrate counting  Recommended the following today  More frequent glucose monitoring before and after breakfast and lunch meals  Trial of Toujeo twice a day  He will need  to adjust Toujeo if either morning or suppertime readings are consistently higher or lower  We will switch him to the Novolog since insurance is not going to pay for Humalog  Continue Invokana, may continue taking this at night as he thinks this helps overnight blood sugars and reduce his chances of low sugars during the day  May do better with [place] and he will check for the insurance coverage on this  Diabetic complications: No significant neuropathy, does have erectile dysfunction, no evidence of nephropathy or history of retinopathy  Hyperlipidemia: Adequately controlled LDL, HDL and triglycerides on 10 mg Crestor  HYPERTENSION: Well controlled  He thinks his blood pressure may get low in the late afternoon and he can try taking half carvedilol in the morning He will take the full tablet of amlodipine instead of three quarters   Sleep apnea, previously treated with CPAP, not clear if he is symptomatic as he has only mild fatigue or sleepiness during the day and no excessive snoring  History of gastroesophageal reflux with occasional dysphagia, recently better   CLL: Currently no systemic symptoms present despite very high white cell count, to follow-up with hematologist  INSOMNIA: He can try taking half of the Ambien to reduce hangover and if he thinks this is better he can try the 6.25 mg Ambien CR  Preventive care:  He is up-to-date on immunizations except tetanus, date unknown  To continue regular eye exams as  recommended with  general and retinal specialist Continue regular dental exams Continue 81 mg aspirin Follow consistent regimen of exercise with walking  At least annual lipid levels in followup   Stool Hemoccult in August Recommended having living will and healthcare power of attorney, of the of advanced directives given for patient to complete   Patient Instructions  Toujeo 6 units 2x daily In the morning sugars are consistently high then increase the evening dose by at least 1 unit Also check the sugars before supper time to help adjust the morning dosage   Check coverage for Tresiba insulin, this will be once per day instead of Toujeo  Instead of doing three quarters of a tablet of amlodipine 10 mg take the full tablet along with reducing the carvedilol half a tablet in the morning  Try taking half a tablet of AMBIEN at bedtime and if this is better may need a new prescription for the 6.25 mg of Ambien CR for the next prescription    Evaluation and management time component including over 50% of the time in counseling = 25 minutes  Teddrick Mallari 04/10/2017, 2:50 PM           ROS

## 2017-04-10 NOTE — Patient Instructions (Addendum)
Toujeo 6 units 2x daily In the morning sugars are consistently high then increase the evening dose by at least 1 unit Also check the sugars before supper time to help adjust the morning dosage   Check coverage for Tresiba insulin, this will be once per day instead of Toujeo  Instead of doing three quarters of a tablet of amlodipine 10 mg take the full tablet along with reducing the carvedilol half a tablet in the morning  Try taking half a tablet of AMBIEN at bedtime and if this is better may need a new prescription for the 6.25 mg of Ambien CR for the next prescription

## 2017-04-11 DIAGNOSIS — E113311 Type 2 diabetes mellitus with moderate nonproliferative diabetic retinopathy with macular edema, right eye: Secondary | ICD-10-CM | POA: Diagnosis not present

## 2017-04-11 DIAGNOSIS — H31092 Other chorioretinal scars, left eye: Secondary | ICD-10-CM | POA: Diagnosis not present

## 2017-04-11 DIAGNOSIS — H43813 Vitreous degeneration, bilateral: Secondary | ICD-10-CM | POA: Diagnosis not present

## 2017-04-11 DIAGNOSIS — E113392 Type 2 diabetes mellitus with moderate nonproliferative diabetic retinopathy without macular edema, left eye: Secondary | ICD-10-CM | POA: Diagnosis not present

## 2017-04-29 ENCOUNTER — Telehealth: Payer: Self-pay | Admitting: Endocrinology

## 2017-04-29 DIAGNOSIS — R69 Illness, unspecified: Secondary | ICD-10-CM | POA: Diagnosis not present

## 2017-04-29 MED ORDER — ZOLPIDEM TARTRATE ER 12.5 MG PO TBCR: 12.5000 mg | EXTENDED_RELEASE_TABLET | Freq: Every evening | ORAL | 3 refills | Status: DC | PRN

## 2017-04-29 NOTE — Telephone Encounter (Signed)
Rx submitted to [facility].

## 2017-04-29 NOTE — Telephone Encounter (Signed)
Pt needs his Zolpidem refilled and sent to [facility] in [place] for a 90 days supply.

## 2017-04-30 ENCOUNTER — Telehealth: Payer: Self-pay | Admitting: Endocrinology

## 2017-04-30 NOTE — Telephone Encounter (Signed)
Patient wife ask if patient could get a 90 day supply of the zolpidem (AMBIEN CR) 12.5 MG CR tablet. Please advise

## 2017-05-01 NOTE — Telephone Encounter (Signed)
Is it possible to have a 90 day prescription of Ambien? Please advise if this needs to stay a 30 day script or if can be a 90 day script.

## 2017-05-01 NOTE — Telephone Encounter (Signed)
Ok, but pt first needs to return the 04/29/17 rx

## 2017-05-01 NOTE — Telephone Encounter (Signed)
[place] and spoke to [place], he stated that patient has not come to pick up the Ambien and we did a verbal request to give a 90 day supply per Dr. Loanne Drilling with 0 refills. Calling patient to confirm this request.

## 2017-05-03 DIAGNOSIS — R69 Illness, unspecified: Secondary | ICD-10-CM | POA: Diagnosis not present

## 2017-05-07 ENCOUNTER — Other Ambulatory Visit (HOSPITAL_BASED_OUTPATIENT_CLINIC_OR_DEPARTMENT_OTHER): Payer: Medicare HMO

## 2017-05-07 ENCOUNTER — Encounter: Payer: Self-pay | Admitting: Internal Medicine

## 2017-05-07 ENCOUNTER — Ambulatory Visit (HOSPITAL_BASED_OUTPATIENT_CLINIC_OR_DEPARTMENT_OTHER): Payer: Medicare HMO | Admitting: Internal Medicine

## 2017-05-07 ENCOUNTER — Telehealth: Payer: Self-pay | Admitting: Internal Medicine

## 2017-05-07 VITALS — BP 141/62 | HR 67 | Temp 98.6°F | Resp 19 | Ht 67.0 in | Wt 163.8 lb

## 2017-05-07 DIAGNOSIS — D72820 Lymphocytosis (symptomatic): Secondary | ICD-10-CM | POA: Diagnosis not present

## 2017-05-07 DIAGNOSIS — C9110 Chronic lymphocytic leukemia of B-cell type not having achieved remission: Secondary | ICD-10-CM

## 2017-05-07 LAB — CBC WITH DIFFERENTIAL/PLATELET
BASO%: 0.2 % (ref 0.0–2.0)
BASOS ABS: 0.1 10*3/uL (ref 0.0–0.1)
EOS ABS: 0.3 10*3/uL (ref 0.0–0.5)
EOS%: 0.4 % (ref 0.0–7.0)
HCT: 38.9 % (ref 38.4–49.9)
HEMOGLOBIN: 12.5 g/dL — AB (ref 13.0–17.1)
LYMPH#: 70.3 10*3/uL — AB (ref 0.9–3.3)
LYMPH%: 92.1 % — ABNORMAL HIGH (ref 14.0–49.0)
MCH: 30.5 pg (ref 27.2–33.4)
MCHC: 32 g/dL (ref 32.0–36.0)
MCV: 95.2 fL (ref 79.3–98.0)
MONO#: 1.3 10*3/uL — ABNORMAL HIGH (ref 0.1–0.9)
MONO%: 1.8 % (ref 0.0–14.0)
NEUT%: 5.5 % — ABNORMAL LOW (ref 39.0–75.0)
NEUTROS ABS: 4.2 10*3/uL (ref 1.5–6.5)
NRBC: 0 % (ref 0–0)
Platelets: 201 10*3/uL (ref 140–400)
RBC: 4.08 10*6/uL — ABNORMAL LOW (ref 4.20–5.82)
RDW: 13.9 % (ref 11.0–14.6)
WBC: 76.3 10*3/uL (ref 4.0–10.3)

## 2017-05-07 LAB — COMPREHENSIVE METABOLIC PANEL
ALBUMIN: 3.8 g/dL (ref 3.5–5.0)
ALK PHOS: 46 U/L (ref 40–150)
ALT: 16 U/L (ref 0–55)
AST: 19 U/L (ref 5–34)
Anion Gap: 6 mEq/L (ref 3–11)
BUN: 26.4 mg/dL — AB (ref 7.0–26.0)
CO2: 27 mEq/L (ref 22–29)
Calcium: 9.1 mg/dL (ref 8.4–10.4)
Chloride: 108 mEq/L (ref 98–109)
Creatinine: 1.5 mg/dL — ABNORMAL HIGH (ref 0.7–1.3)
EGFR: 47 mL/min/{1.73_m2} — AB (ref >90)
GLUCOSE: 173 mg/dL — AB (ref 70–140)
Potassium: 4.4 mEq/L (ref 3.5–5.1)
SODIUM: 142 meq/L (ref 136–145)
TOTAL PROTEIN: 6.1 g/dL — AB (ref 6.4–8.3)
Total Bilirubin: 0.44 mg/dL (ref 0.20–1.20)

## 2017-05-07 LAB — LACTATE DEHYDROGENASE: LDH: 256 U/L — ABNORMAL HIGH (ref 125–245)

## 2017-05-07 LAB — TECHNOLOGIST REVIEW

## 2017-05-07 NOTE — Progress Notes (Signed)
[place] [phone]   [phone]  OFFICE PROGRESS NOTE  Elayne Snare, MD [address] [place] [address]  DIAGNOSIS: Chronic lymphocytic leukemia diagnosed in October 2014.  PRIOR THERAPY: None  CURRENT THERAPY: Observation.  INTERVAL HISTORY: Tony Marshall 76 y.o. male returns to the clinic today for follow-up visit accompanied by his wife. The patient is feeling fine today with no specific complaints. He has no weight loss or night sweats. He denied having any palpable lymphadenopathy. He denied having any bleeding issues. He has no chest pain, shortness of breath, cough or hemoptysis. He denied having any fever or chills. He had repeat CBC and comprehensive metabolic panel performed recently and he is here for evaluation and discussion of his lab results.  MEDICAL HISTORY: Past Medical History:  Diagnosis Date  . CLL (chronic lymphocytic leukemia) ([place])   . Diabetes mellitus without complication ([place])   . GERD (gastroesophageal reflux disease)   . Hyperlipidemia   . Hypertension   . Lymphocytosis   . Sleep apnea     ALLERGIES:  is allergic to glycopyrrolate.  MEDICATIONS:  Current Outpatient Prescriptions  Medication Sig Dispense Refill  . amLODipine (NORVASC) 10 MG tablet TAKE 1 TABLET BY MOUTH DAILY 90 tablet 1  . aspirin 81 MG EC tablet Take 81 mg by mouth daily. Swallow whole.    . benazepril (LOTENSIN) 20 MG tablet TAKE 1 TABLET BY MOUTH ONCE DAILY 90 tablet 0  . canagliflozin (INVOKANA) 100 MG TABS tablet TAKE 1 TABLET BY MOUTH DAILY BEFORE BREAKFAST 30 tablet 3  . carvedilol (COREG) 25 MG tablet TAKE 1 TABLET BY MOUTH TWICE DAILY WITH MEALS 180 tablet 0  . doxazosin (CARDURA) 4 MG tablet TAKE 1 TABLET BY MOUTH AT BEDTIME 90 tablet 0  . DUREZOL 0.05 % EMUL Place 1 drop into the right eye 2 (two) times daily.    Marland Kitchen esomeprazole (NEXIUM) 40 MG capsule Take 40 mg by mouth daily at 12 noon.    Marland Kitchen HUMALOG  KWIKPEN 100 UNIT/ML KiwkPen INJECT 3-9 UNITS AT BREAKFAST, 3-4 UNITS AT LUNCH AND 6-9 UNITS AT DINNER 45 mL 0  . insulin aspart (NOVOLOG) 100 UNIT/ML FlexPen Inject 3-9 units at breakfast, 3-4 units at lunch, and 6-9 units at dinner 4 pen 3  . Insulin Glargine (TOUJEO SOLOSTAR) 300 UNIT/ML SOPN Inject into the skin daily. Inject 12 units every morning    . ONE TOUCH ULTRA TEST test strip USE ONE STRIP TO CHECK GLUCOSE THREE TIMES DAILY 100 each 3  . pioglitazone (ACTOS) 15 MG tablet TAKE 1 TABLET BY MOUTH DAILY. 90 tablet 0  . rosuvastatin (CRESTOR) 5 MG tablet Take 5 mg by mouth every morning.    . saccharomyces boulardii (FLORASTOR) 250 MG capsule Take 250 mg by mouth 2 (two) times daily.    Marland Kitchen zolpidem (AMBIEN CR) 12.5 MG CR tablet Take 1 tablet (12.5 mg total) by mouth at bedtime as needed for sleep. 30 tablet 3  . glucagon (GLUCAGON EMERGENCY) 1 MG injection Inject 1 mg into the vein once as needed. (Patient not taking: Reported on 05/07/2017) 1 each 5  . sildenafil (REVATIO) 20 MG tablet TAKE 5 TABLETS BY MOUTH AS NEEDED once a week. 30 tablet 2   Current Facility-Administered Medications  Medication Dose Route Frequency Provider Last Rate Last Dose  . 0.9 %  sodium chloride infusion  500 mL Intravenous Continuous Pyrtle, Lajuan Lines, MD  SURGICAL HISTORY:  Past Surgical History:  Procedure Laterality Date  . BLEPHAROPLASTY    . COLONOSCOPY  2012  . HEMORRHOID SURGERY     Sclerotherapy  . KNEE SURGERY Right 2002  . POLYPECTOMY    . SHOULDER SURGERY Right 2007    REVIEW OF SYSTEMS:  A comprehensive review of systems was negative except for: Constitutional: positive for fatigue   PHYSICAL EXAMINATION: General appearance: alert, cooperative, fatigued and no distress Head: Normocephalic, without obvious abnormality, atraumatic Neck: no adenopathy, no JVD, supple, symmetrical, trachea midline and thyroid not enlarged, symmetric, no tenderness/mass/nodules Lymph nodes: Cervical,  supraclavicular, and axillary nodes normal. Resp: clear to auscultation bilaterally Back: symmetric, no curvature. ROM normal. No CVA tenderness. Cardio: regular rate and rhythm, S1, S2 normal, no murmur, click, rub or gallop GI: soft, non-tender; bowel sounds normal; no masses,  no organomegaly Extremities: extremities normal, atraumatic, no cyanosis or edema  ECOG PERFORMANCE STATUS: 1 - Symptomatic but completely ambulatory  Blood pressure (!) 141/62, pulse 67, temperature 98.6 F (37 C), temperature source Oral, resp. rate 19, height 5\' 7"  (1.702 m), weight 163 lb 12.8 oz (74.3 kg), SpO2 100 %.  LABORATORY DATA: Lab Results  Component Value Date   WBC 76.3 (HH) 05/07/2017   HGB 12.5 (L) 05/07/2017   HCT 38.9 05/07/2017   MCV 95.2 05/07/2017   PLT 201 05/07/2017      Chemistry      Component Value Date/Time   NA 142 05/07/2017 1255   K 4.4 05/07/2017 1255   CL 108 04/07/2017 0941   CO2 27 05/07/2017 1255   BUN 26.4 (H) 05/07/2017 1255   CREATININE 1.5 (H) 05/07/2017 1255      Component Value Date/Time   CALCIUM 9.1 05/07/2017 1255   ALKPHOS 46 05/07/2017 1255   AST 19 05/07/2017 1255   ALT 16 05/07/2017 1255   BILITOT 0.44 05/07/2017 1255       RADIOGRAPHIC STUDIES: No results found.  ASSESSMENT AND PLAN:  This is a very pleasant 76 years old white male with chronic lymphocytic leukemia currently on observation. His CBC today showed further mild increase in the total white blood count. The patient is currently asymptomatic except for mild fatigue mainly in the morning. I discussed the lab result with the patient and his wife and recommended for him to continue on observation. I will see him back for follow-up visit in 3 months for reevaluation with repeat CBC, comprehensive metabolic panel and LDH. He was advised to call immediately if he has any concerning symptoms in the interval. The patient voices understanding of current disease status and treatment options  and is in agreement with the current care plan. All questions were answered. The patient knows to call the clinic with any problems, questions or concerns. We can certainly see the patient much sooner if necessary. I spent 10 minutes counseling the patient face to face. The total time spent in the appointment was 15 minutes.  Disclaimer: This note was dictated with voice recognition software. Similar sounding words can inadvertently be transcribed and may not be corrected upon review.

## 2017-05-07 NOTE — Telephone Encounter (Signed)
Gave patient AVS and calender per 5/23 LOS - lab and f/u in 3 months.

## 2017-06-17 ENCOUNTER — Other Ambulatory Visit: Payer: Self-pay | Admitting: Endocrinology

## 2017-07-06 ENCOUNTER — Other Ambulatory Visit: Payer: Self-pay | Admitting: Endocrinology

## 2017-07-07 ENCOUNTER — Other Ambulatory Visit (INDEPENDENT_AMBULATORY_CARE_PROVIDER_SITE_OTHER): Payer: Medicare HMO

## 2017-07-07 DIAGNOSIS — E1165 Type 2 diabetes mellitus with hyperglycemia: Secondary | ICD-10-CM | POA: Diagnosis not present

## 2017-07-07 DIAGNOSIS — Z794 Long term (current) use of insulin: Secondary | ICD-10-CM

## 2017-07-07 LAB — BASIC METABOLIC PANEL
BUN: 17 mg/dL (ref 6–23)
CALCIUM: 9.4 mg/dL (ref 8.4–10.5)
CO2: 30 meq/L (ref 19–32)
Chloride: 106 mEq/L (ref 96–112)
Creatinine, Ser: 1.1 mg/dL (ref 0.40–1.50)
GFR: 69.62 mL/min (ref >60.00)
Glucose, Bld: 166 mg/dL — ABNORMAL HIGH (ref 70–99)
POTASSIUM: 4.4 meq/L (ref 3.5–5.1)
SODIUM: 140 meq/L (ref 135–145)

## 2017-07-07 LAB — HEMOGLOBIN A1C: HEMOGLOBIN A1C: 7.9 % — AB (ref 4.6–6.5)

## 2017-07-09 NOTE — Progress Notes (Signed)
Patient ID: Tony Marshall, male   DOB: 02/20/1941, 76 y.o.   MRN: [id number]   Reason for Appointment:  follow-up of various conditions  History of Present Illness   1. Type 2 DIABETES MELITUS, date of diagnosis:  1992        His blood sugar has been  difficult to control over the last few years because of variability in his blood sugars On basal bolus insulin for a few years and his oral hypoglycemic drugs have been continued Also did not tolerate GLP-1 drugs or symlin because of nausea He is essentially looking like a a type I patient with requiring relatively low amounts of insulin and twice a day dosage of basal insulin He has done somewhat better with Levemir compared to Lantus and is using this twice a day also  He has also had overall much better controlled with adding Invokana in 07/2016  Recent history:  Insulin regimen: Toujeo insulin 6--6 units am.  Mealtime Novolog  insulin: Breakfast = 4 units, lunch 0-4  supper 9-10 units based on meal size   His A1c is better than usual at 6.9, previously 7.3  Recent blood sugar patterns and problems:  He has not taking the Invokana for the last few weeks and his blood sugars are poorly controlled  Previously with this he had more even blood sugar control throughout the day and his A1c was below 7  He also has gained back some weight  Part of his poor control is also related to eating more fried food and not watching his diet consistently also  He has tried taking Toujeo twice a day but not clear if this is helping as his blood sugars are overall fluctuating without the Invokana  Blood sugars are generally higher fasting and before supper and variable after evening meal although overall not high postprandially at night with his trying to adjust his dose based on what he is eating  HYPOGLYCEMIA has occurred sporadically with one low reading of 48 after supper and couple of readings of 64 and 65 at breakfast and after  supper also  He says that he is too tired to exercise   Hypoglycemia:  as above  Oral hypoglycemic drugs: Actos, not on Invokana        Side effects from medications: Diarrhea from Metformin       Proper timing of medications in relation to meals: Yes.          Monitors blood glucose: QD    Glucometer: One Touch.          Blood Glucose readings from one touch zoom software download:   Mean values apply above for all meters except median for One Touch  PRE-MEAL Fasting Lunch Dinner Bedtime Overall  Glucose range:  65-216    164-221 48-272    Mean/median: 170   158  163+/-60     Meals: 2 meals per day. Snack or lunch   He is eating less fried and carbohydrates overall   Has Bfst at 10-11 am dinner at 7-8 PM     Physical activity: exercise:  none     Wt Readings from Last 3 Encounters:  07/10/17 170 lb (77.1 kg)  05/07/17 163 lb 12.8 oz (74.3 kg)  04/10/17 162 lb 12.8 oz (73.8 kg)           LABS:  Lab Results  Component Value Date   HGBA1C 7.9 (H) 07/07/2017  HGBA1C 6.9 (H) 04/07/2017   HGBA1C 7.3 (H) 01/07/2017   Lab Results  Component Value Date   MICROALBUR 0.9 04/07/2017   LDLCALC 78 04/07/2017   CREATININE 1.10 07/07/2017    2. Hypertension:    His blood pressure has been high for about 30 years.  He was evaluated in 1/08 with urine metanephrines and renal artery ultrasound.    Recent home blood pressure about 130-140/70, checking periodically.  He thinks his diastolic blood pressure may be lower in the afternoon at times and not clear if he feels a little lightheaded at this time  Also several other medications which he takes in the morning, taking carvedilol twice a day     Lab on 07/07/2017  Component Date Value Ref Range Status  . Hgb A1c MFr Bld 07/07/2017 7.9* 4.6 - 6.5 % Final   Glycemic Control Guidelines for People with Diabetes:Non Diabetic:  <6%Goal of Therapy: <7%Additional Action Suggested:  >8%   . Sodium 07/07/2017 140  135 - 145 mEq/L  Final  . Potassium 07/07/2017 4.4  3.5 - 5.1 mEq/L Final  . Chloride 07/07/2017 106  96 - 112 mEq/L Final  . CO2 07/07/2017 30  19 - 32 mEq/L Final  . Glucose, Bld 07/07/2017 166* 70 - 99 mg/dL Final  . BUN 07/07/2017 17  6 - 23 mg/dL Final  . Creatinine, Ser 07/07/2017 1.10  0.40 - 1.50 mg/dL Final  . Calcium 07/07/2017 9.4  8.4 - 10.5 mg/dL Final  . GFR 07/07/2017 69.62  >60.00 mL/min Final    Allergies as of 07/10/2017      Reactions   Glycopyrrolate Rash      Medication List       Accurate as of 07/10/17  1:10 PM. Always use your most recent med list.          amLODipine 10 MG tablet Commonly known as:  NORVASC TAKE 1 TABLET BY MOUTH DAILY   aspirin 81 MG EC tablet Take 81 mg by mouth daily. Swallow whole.   benazepril 20 MG tablet Commonly known as:  LOTENSIN TAKE ONE TABLET BY MOUTH ONE TIME DAILY   canagliflozin 100 MG Tabs tablet Commonly known as:  INVOKANA TAKE 1 TABLET BY MOUTH DAILY BEFORE BREAKFAST   carvedilol 25 MG tablet Commonly known as:  COREG TAKE 1 TABLET BY MOUTH TWICE DAILY WITH MEALS   doxazosin 4 MG tablet Commonly known as:  CARDURA TAKE ONE TABLET BY MOUTH AT BEDTIME   DUREZOL 0.05 % Emul Generic drug:  Difluprednate Place 1 drop into the right eye 2 (two) times daily.   esomeprazole 40 MG capsule Commonly known as:  NEXIUM Take 40 mg by mouth daily at 12 noon.   FLORASTOR 250 MG capsule Generic drug:  saccharomyces boulardii Take 250 mg by mouth 2 (two) times daily.   glucagon 1 MG injection Commonly known as:  GLUCAGON EMERGENCY Inject 1 mg into the vein once as needed.   HUMALOG KWIKPEN 100 UNIT/ML KiwkPen Generic drug:  insulin lispro INJECT 3-9 UNITS AT BREAKFAST, 3-4 UNITS AT LUNCH AND 6-9 UNITS AT DINNER   ONE TOUCH ULTRA TEST test strip Generic drug:  glucose blood USE ONE STRIP TO CHECK GLUCOSE THREE TIMES DAILY   pioglitazone 15 MG tablet Commonly known as:  ACTOS TAKE ONE TABLET BY MOUTH ONE TIME DAILY     rosuvastatin 5 MG tablet Commonly known as:  CRESTOR Take 5 mg by mouth every morning.   sildenafil 20 MG tablet Commonly  known as:  REVATIO TAKE 5 TABLETS BY MOUTH AS NEEDED once a week.   TOUJEO SOLOSTAR 300 UNIT/ML Sopn Generic drug:  Insulin Glargine Inject into the skin daily. Inject 6 units every morning and 6 units every evening   zolpidem 12.5 MG CR tablet Commonly known as:  AMBIEN CR Take 1 tablet (12.5 mg total) by mouth at bedtime as needed for sleep.       Allergies:  Allergies  Allergen Reactions  . Glycopyrrolate Rash    Past Medical History:  Diagnosis Date  . CLL (chronic lymphocytic leukemia) ([place])   . Diabetes mellitus without complication ([place])   . GERD (gastroesophageal reflux disease)   . Hyperlipidemia   . Hypertension   . Lymphocytosis   . Sleep apnea     Past Surgical History:  Procedure Laterality Date  . BLEPHAROPLASTY    . COLONOSCOPY  2012  . HEMORRHOID SURGERY     Sclerotherapy  . KNEE SURGERY Right 2002  . POLYPECTOMY    . SHOULDER SURGERY Right 2007    Family History  Problem Relation Age of Onset  . Kidney disease Mother   . CAD Maternal Aunt   . Colon cancer Neg Hx   . Esophageal cancer Neg Hx   . Rectal cancer Neg Hx   . Stomach cancer Neg Hx     Social History:  reports that he has never smoked. He quit smokeless tobacco use about 4 years ago. His smokeless tobacco use included Chew. He reports that he drinks about 3.6 oz of alcohol per week . He reports that he does not use drugs.      ROS   HYPERLIPIDEMIA: The lipid abnormality consists of elevated LDL which is very well controlled with 10mg  Crestor His dose was increased in January  Lab Results  Component Value Date   CHOL 136 04/07/2017   HDL 46.00 04/07/2017   LDLCALC 78 04/07/2017   TRIG 63.0 04/07/2017   CHOLHDL 3 04/07/2017    Sleep apnea, previously treated with CPAP, he has started back on this about a week He was previously using it regularly  but apparently having a lot of fatigue for quite some time Still complaining of tiredness  INSOMNIA: This is chronic and he has been taking Ambien.  Because of late insomnia was switched from regular Ambien to the Ambien CR He says that he still has a hangover and does not follow sleep as quickly but wants to stay with the CR preparation, currently taking a half of the 12.5 mg dose    Examination:   BP 140/80   Pulse 72   Ht 5\' 7"  (1.702 m)   Wt 170 lb (77.1 kg)   SpO2 97%   PF (!) 9 L/min   BMI 26.63 kg/m   Body mass index is 26.63 kg/m.    ASSESSMENT/ PLAN:   Diabetes type 2, Nonobese See history of present illness for detailed discussion of his current management, blood sugar patterns and problems identified  With stopping Invokana and has not been consistent on diet or exercise he has worsening of his diabetes control was discussed above and weight gain Discussed various options of getting Invokana back again, apparently has not been able to get the assistance program this year for some reason  Recommended the following today  He will try to get the Invokana from [place]  More frequent glucose monitoring on waking up and also after breakfast and evening meals  Trial of 7 units Toujeo  twice a day but if his sugars are still high before breakfast and supper he can increase the doses; also if blood sugars go down with restarting Invokana he will have to go back to 6 units  Discussed blood sugar targets after meals and fasting as well as adjusting the Toujeo either morning or evening based on pre-meal blood sugars  He needs to start exercise with at least an exercise bike  He will still need to check coverage for Tyler Aas if similar to [facility] and then he can take it once a day   Hyperlipidemia: Adequately controlled LDL, HDL and triglycerides on 10 mg Crestor  HYPERTENSION: Well controlled  Discussed that he may need to adjust medications if he starts back on  Invokana  FATIGUE: May be related to his sleep apnea but also has continued variable sleep benefits from taking Ambien CR  Insomnia: Discussed that on his next prescription we will use 6.25 mg of Ambien CR instead of his cutting the 12.5 mg in half  Counseling time on subjects discussed in assessment and plan sections is over 50% of today's 25 minute visit   Joah Patlan 07/10/2017, 1:10 PM

## 2017-07-10 ENCOUNTER — Telehealth: Payer: Self-pay | Admitting: Endocrinology

## 2017-07-10 ENCOUNTER — Other Ambulatory Visit: Payer: Self-pay

## 2017-07-10 ENCOUNTER — Encounter: Payer: Self-pay | Admitting: Endocrinology

## 2017-07-10 ENCOUNTER — Ambulatory Visit (INDEPENDENT_AMBULATORY_CARE_PROVIDER_SITE_OTHER): Payer: Medicare HMO | Admitting: Endocrinology

## 2017-07-10 VITALS — BP 140/80 | HR 72 | Ht 67.0 in | Wt 170.0 lb

## 2017-07-10 DIAGNOSIS — R5383 Other fatigue: Secondary | ICD-10-CM

## 2017-07-10 DIAGNOSIS — Z794 Long term (current) use of insulin: Secondary | ICD-10-CM | POA: Diagnosis not present

## 2017-07-10 DIAGNOSIS — R69 Illness, unspecified: Secondary | ICD-10-CM | POA: Diagnosis not present

## 2017-07-10 DIAGNOSIS — F5104 Psychophysiologic insomnia: Secondary | ICD-10-CM | POA: Diagnosis not present

## 2017-07-10 DIAGNOSIS — E1165 Type 2 diabetes mellitus with hyperglycemia: Secondary | ICD-10-CM | POA: Diagnosis not present

## 2017-07-10 DIAGNOSIS — I10 Essential (primary) hypertension: Secondary | ICD-10-CM | POA: Diagnosis not present

## 2017-07-10 MED ORDER — INSULIN DEGLUDEC 100 UNIT/ML ~~LOC~~ SOPN
PEN_INJECTOR | SUBCUTANEOUS | 3 refills | Status: DC
Start: 2017-07-10 — End: 2018-08-02

## 2017-07-10 MED ORDER — GLUCOSE BLOOD VI STRP: ORAL_STRIP | 3 refills | Status: DC

## 2017-07-10 MED ORDER — CANAGLIFLOZIN 100 MG PO TABS: ORAL_TABLET | ORAL | 3 refills | Status: DC

## 2017-07-10 NOTE — Telephone Encounter (Signed)
Patient needs clarification on the messages below. Patient requesting a call back today to advise. Okay to leave a detailed message on home phone [phone].

## 2017-07-10 NOTE — Telephone Encounter (Signed)
He can change the Toujeo to [place] U 100, start with 12 units once a day and go up after 1 week if fasting blood sugars are over 140

## 2017-07-10 NOTE — Telephone Encounter (Signed)
Pt needs refills called into [facility] in [place] for the one touch verio test strips are needed

## 2017-07-10 NOTE — Patient Instructions (Addendum)
Toujeo 7 units twice daily  Exercise daily

## 2017-07-10 NOTE — Telephone Encounter (Signed)
Called patient and left a voice message to let him know that I have sent in the [place] Strips to the [facility] in [place] and also to see if he wanted to send in a new prescription for the Tresiba tot he [employer] on [address]. Asked to please call back and confirm if he has checked with insurance and this is covered and he wants it.

## 2017-07-10 NOTE — Telephone Encounter (Signed)
I don't believe she is aware of his insulin.  He told me he is taking 6 units twice a day of Toujeo. He needs to take the same dose of 12 units Tresiba every morning for 5-7 days and then go up to 13-14 if morning sugars are over consistently high

## 2017-07-10 NOTE — Telephone Encounter (Signed)
**  Remind patient they can make refill requests via MyChart**  Medication refill request (Name & Dosage):  TRESIBA  Preferred pharmacy (Name & Address):  [facility] # [address] [place] [phone] (Phone) [phone] (Fax)      Other comments (if applicable):  Medication is not listed in the patient's list, please advise.

## 2017-07-10 NOTE — Telephone Encounter (Signed)
I see the note in today's office visit for him to check to see if the Tyler Aas is covered by his insurance and this may possibly work better for him.

## 2017-07-10 NOTE — Telephone Encounter (Signed)
Called patient and spoke to Enid Derry his wife and she stated that he has been taking the [place] and he has been using 12-15 units depending on his blood sugars. I advised that I will be sending in a new prescription to [facility] for him.

## 2017-07-11 NOTE — Telephone Encounter (Signed)
Called patient and let him know to use 12 units of the [place] every morning for 5-7 days and if his blood sugars are still continuing to be high to increase his dose to 13-14 units every morning. If he is still having high blood sugars I advised to keep track of them and to call us to let us know so we can adjust his dose.

## 2017-07-16 NOTE — Telephone Encounter (Signed)
Routing to you °

## 2017-07-16 NOTE — Telephone Encounter (Signed)
[employer] is faxing a PA needed for the patient's insulin degludec (TRESIBA FLEXTOUCH) 100 UNIT/ML SOPN FlexTouch Pen  Please advise. Ref: Starling Manns

## 2017-07-31 DIAGNOSIS — R202 Paresthesia of skin: Secondary | ICD-10-CM | POA: Diagnosis not present

## 2017-07-31 DIAGNOSIS — D1801 Hemangioma of skin and subcutaneous tissue: Secondary | ICD-10-CM | POA: Diagnosis not present

## 2017-07-31 DIAGNOSIS — L570 Actinic keratosis: Secondary | ICD-10-CM | POA: Diagnosis not present

## 2017-07-31 DIAGNOSIS — L821 Other seborrheic keratosis: Secondary | ICD-10-CM | POA: Diagnosis not present

## 2017-07-31 DIAGNOSIS — L814 Other melanin hyperpigmentation: Secondary | ICD-10-CM | POA: Diagnosis not present

## 2017-07-31 DIAGNOSIS — D225 Melanocytic nevi of trunk: Secondary | ICD-10-CM | POA: Diagnosis not present

## 2017-07-31 DIAGNOSIS — L820 Inflamed seborrheic keratosis: Secondary | ICD-10-CM | POA: Diagnosis not present

## 2017-08-06 ENCOUNTER — Ambulatory Visit (HOSPITAL_BASED_OUTPATIENT_CLINIC_OR_DEPARTMENT_OTHER): Payer: Medicare HMO | Admitting: Internal Medicine

## 2017-08-06 ENCOUNTER — Other Ambulatory Visit (HOSPITAL_BASED_OUTPATIENT_CLINIC_OR_DEPARTMENT_OTHER): Payer: Medicare HMO

## 2017-08-06 ENCOUNTER — Telehealth: Payer: Self-pay | Admitting: Internal Medicine

## 2017-08-06 ENCOUNTER — Encounter: Payer: Self-pay | Admitting: Internal Medicine

## 2017-08-06 VITALS — BP 133/68 | HR 57 | Temp 98.4°F | Resp 18 | Ht 67.0 in | Wt 166.0 lb

## 2017-08-06 DIAGNOSIS — D72820 Lymphocytosis (symptomatic): Secondary | ICD-10-CM

## 2017-08-06 DIAGNOSIS — R5383 Other fatigue: Secondary | ICD-10-CM

## 2017-08-06 DIAGNOSIS — C9110 Chronic lymphocytic leukemia of B-cell type not having achieved remission: Secondary | ICD-10-CM

## 2017-08-06 LAB — COMPREHENSIVE METABOLIC PANEL
ALT: 19 U/L (ref 0–55)
AST: 20 U/L (ref 5–34)
Albumin: 3.8 g/dL (ref 3.5–5.0)
Alkaline Phosphatase: 47 U/L (ref 40–150)
Anion Gap: 7 mEq/L (ref 3–11)
BUN: 22.9 mg/dL (ref 7.0–26.0)
CHLORIDE: 109 meq/L (ref 98–109)
CO2: 25 meq/L (ref 22–29)
CREATININE: 1.3 mg/dL (ref 0.7–1.3)
Calcium: 9.3 mg/dL (ref 8.4–10.4)
EGFR: 56 mL/min/{1.73_m2} — ABNORMAL LOW (ref >90)
GLUCOSE: 150 mg/dL — AB (ref 70–140)
POTASSIUM: 4.3 meq/L (ref 3.5–5.1)
SODIUM: 141 meq/L (ref 136–145)
Total Bilirubin: 0.46 mg/dL (ref 0.20–1.20)
Total Protein: 6.5 g/dL (ref 6.4–8.3)

## 2017-08-06 LAB — CBC WITH DIFFERENTIAL/PLATELET
BASO%: 0.2 % (ref 0.0–2.0)
BASOS ABS: 0.1 10*3/uL (ref 0.0–0.1)
EOS%: 0.6 % (ref 0.0–7.0)
Eosinophils Absolute: 0.4 10*3/uL (ref 0.0–0.5)
HCT: 41.3 % (ref 38.4–49.9)
HGB: 13.3 g/dL (ref 13.0–17.1)
LYMPH#: 65.5 10*3/uL — AB (ref 0.9–3.3)
LYMPH%: 93.4 % — ABNORMAL HIGH (ref 14.0–49.0)
MCH: 31 pg (ref 27.2–33.4)
MCHC: 32.2 g/dL (ref 32.0–36.0)
MCV: 96.4 fL (ref 79.3–98.0)
MONO#: 0.5 10*3/uL (ref 0.1–0.9)
MONO%: 0.7 % (ref 0.0–14.0)
NEUT#: 3.6 10*3/uL (ref 1.5–6.5)
NEUT%: 5.1 % — AB (ref 39.0–75.0)
Platelets: 225 10*3/uL (ref 140–400)
RBC: 4.29 10*6/uL (ref 4.20–5.82)
RDW: 14.6 % (ref 11.0–14.6)
WBC: 70.1 10*3/uL (ref 4.0–10.3)

## 2017-08-06 LAB — TECHNOLOGIST REVIEW

## 2017-08-06 LAB — LACTATE DEHYDROGENASE: LDH: 250 U/L — AB (ref 125–245)

## 2017-08-06 NOTE — Telephone Encounter (Signed)
Gave patient avs and calendars with appts.

## 2017-08-06 NOTE — Progress Notes (Signed)
Tony Marshall [phone]   [phone]  OFFICE PROGRESS NOTE  Tony Snare, MD [address] [place] [address]  DIAGNOSIS: Chronic lymphocytic leukemia diagnosed in October 2014.  PRIOR THERAPY: None  CURRENT THERAPY: Observation.  INTERVAL HISTORY: Tony Marshall 76 y.o. male returns to the clinic today for follow-up visit accompanied by his wife. The patient is feeling fine today with no specific complaints except for mild fatigue. He was seen by his primary care physician and he is checking his thyroid function. He denied having any significant weight loss or night sweats. He has no nausea, vomiting, diarrhea or constipation. He has no chest pain, shortness of breath, cough or hemoptysis. He is here today for evaluation and repeat blood work.   MEDICAL HISTORY: Past Medical History:  Diagnosis Date  . CLL (chronic lymphocytic leukemia) ([place])   . Diabetes mellitus without complication ([place])   . GERD (gastroesophageal reflux disease)   . Hyperlipidemia   . Hypertension   . Lymphocytosis   . Sleep apnea     ALLERGIES:  is allergic to glycopyrrolate.  MEDICATIONS:  Current Outpatient Prescriptions  Medication Sig Dispense Refill  . amLODipine (NORVASC) 10 MG tablet TAKE 1 TABLET BY MOUTH DAILY 90 tablet 0  . aspirin 81 MG EC tablet Take 81 mg by mouth daily. Swallow whole.    . benazepril (LOTENSIN) 20 MG tablet TAKE ONE TABLET BY MOUTH ONE TIME DAILY  90 tablet 0  . canagliflozin (INVOKANA) 100 MG TABS tablet TAKE 1 TABLET BY MOUTH DAILY BEFORE BREAKFAST 90 tablet 3  . carvedilol (COREG) 25 MG tablet TAKE 1 TABLET BY MOUTH TWICE DAILY WITH MEALS 180 tablet 0  . doxazosin (CARDURA) 4 MG tablet TAKE ONE TABLET BY MOUTH AT BEDTIME  90 tablet 0  . DUREZOL 0.05 % EMUL Place 1 drop into the right eye 2 (two) times daily.    Marland Kitchen esomeprazole (NEXIUM) 40 MG capsule Take 40 mg by mouth daily at 12 noon.    Marland Kitchen glucagon (GLUCAGON  EMERGENCY) 1 MG injection Inject 1 mg into the vein once as needed. 1 each 5  . glucose blood (ONETOUCH VERIO) test strip Use to check blood sugars 3 times daily 100 each 3  . HUMALOG KWIKPEN 100 UNIT/ML KiwkPen INJECT 3-9 UNITS AT BREAKFAST, 3-4 UNITS AT LUNCH AND 6-9 UNITS AT DINNER 45 mL 0  . insulin degludec (TRESIBA FLEXTOUCH) 100 UNIT/ML SOPN FlexTouch Pen Give 12 units daily. If fasting blood sugars are over 140 increase dosage up to 15 units daily as directed. 15 mL 3  . Insulin Glargine (TOUJEO SOLOSTAR) 300 UNIT/ML SOPN Inject into the skin daily. Inject 6 units every morning and 6 units every evening    . ONE TOUCH ULTRA TEST test strip USE ONE STRIP TO CHECK GLUCOSE THREE TIMES DAILY 100 each 3  . pioglitazone (ACTOS) 15 MG tablet TAKE ONE TABLET BY MOUTH ONE TIME DAILY  90 tablet 0  . rosuvastatin (CRESTOR) 5 MG tablet Take 5 mg by mouth every morning.    . saccharomyces boulardii (FLORASTOR) 250 MG capsule Take 250 mg by mouth 2 (two) times daily.    . sildenafil (REVATIO) 20 MG tablet TAKE 5 TABLETS BY MOUTH AS NEEDED once a week. 30 tablet 2  . zolpidem (AMBIEN CR) 12.5 MG CR tablet Take 1 tablet (12.5 mg total) by mouth at bedtime as needed for sleep. 30 tablet 3   Current Facility-Administered  Medications  Medication Dose Route Frequency Provider Last Rate Last Dose  . 0.9 %  sodium chloride infusion  500 mL Intravenous Continuous Pyrtle, Lajuan Lines, MD        SURGICAL HISTORY:  Past Surgical History:  Procedure Laterality Date  . BLEPHAROPLASTY    . COLONOSCOPY  2012  . HEMORRHOID SURGERY     Sclerotherapy  . KNEE SURGERY Right 2002  . POLYPECTOMY    . SHOULDER SURGERY Right 2007    REVIEW OF SYSTEMS:  A comprehensive review of systems was negative except for: Constitutional: positive for fatigue   PHYSICAL EXAMINATION: General appearance: alert, cooperative, fatigued and no distress Head: Normocephalic, without obvious abnormality, atraumatic Neck: no adenopathy, no  JVD, supple, symmetrical, trachea midline and thyroid not enlarged, symmetric, no tenderness/mass/nodules Lymph nodes: Cervical, supraclavicular, and axillary nodes normal. Resp: clear to auscultation bilaterally Back: symmetric, no curvature. ROM normal. No CVA tenderness. Cardio: regular rate and rhythm, S1, S2 normal, no murmur, click, rub or gallop GI: soft, non-tender; bowel sounds normal; no masses,  no organomegaly Extremities: extremities normal, atraumatic, no cyanosis or edema  ECOG PERFORMANCE STATUS: 1 - Symptomatic but completely ambulatory  Blood pressure 133/68, pulse (!) 57, temperature 98.4 F (36.9 C), temperature source Oral, resp. rate 18, height 5\' 7"  (1.702 m), weight 166 lb (75.3 kg), SpO2 100 %.  LABORATORY DATA: Lab Results  Component Value Date   WBC 70.1 (HH) 08/06/2017   HGB 13.3 08/06/2017   HCT 41.3 08/06/2017   MCV 96.4 08/06/2017   PLT 225 08/06/2017      Chemistry      Component Value Date/Time   NA 140 07/07/2017 1019   NA 142 05/07/2017 1255   K 4.4 07/07/2017 1019   K 4.4 05/07/2017 1255   CL 106 07/07/2017 1019   CO2 30 07/07/2017 1019   CO2 27 05/07/2017 1255   BUN 17 07/07/2017 1019   BUN 26.4 (H) 05/07/2017 1255   CREATININE 1.10 07/07/2017 1019   CREATININE 1.5 (H) 05/07/2017 1255      Component Value Date/Time   CALCIUM 9.4 07/07/2017 1019   CALCIUM 9.1 05/07/2017 1255   ALKPHOS 46 05/07/2017 1255   AST 19 05/07/2017 1255   ALT 16 05/07/2017 1255   BILITOT 0.44 05/07/2017 1255       RADIOGRAPHIC STUDIES: No results found.  ASSESSMENT AND PLAN:  This is a very pleasant 76 years old white male with chronic lymphocytic leukemia currently on observation. The patient is feeling very well today with no specific complaints except for fatigue. He has no palpable lymphadenopathy. CBC today showed a stable white blood count of 70,100. I recommended for the patient to continue on observation with repeat CBC and comprehensive  metabolic panel in 3 months. For the fatigue he is currently being evaluated for thyroid dysfunction by his primary care physician. The patient was advised to call immediately if he has any concerning symptoms in the interval. The patient voices understanding of current disease status and treatment options and is in agreement with the current care plan. All questions were answered. The patient knows to call the clinic with any problems, questions or concerns. We can certainly see the patient much sooner if necessary. I spent 10 minutes counseling the patient face to face. The total time spent in the appointment was 15 minutes.  Disclaimer: This note was dictated with voice recognition software. Similar sounding words can inadvertently be transcribed and may not be corrected upon review.

## 2017-08-23 DIAGNOSIS — R69 Illness, unspecified: Secondary | ICD-10-CM | POA: Diagnosis not present

## 2017-09-01 ENCOUNTER — Other Ambulatory Visit: Payer: Self-pay | Admitting: Endocrinology

## 2017-09-09 ENCOUNTER — Other Ambulatory Visit: Payer: Self-pay | Admitting: Endocrinology

## 2017-09-15 DIAGNOSIS — R69 Illness, unspecified: Secondary | ICD-10-CM | POA: Diagnosis not present

## 2017-09-30 ENCOUNTER — Other Ambulatory Visit: Payer: Self-pay | Admitting: Endocrinology

## 2017-10-10 ENCOUNTER — Other Ambulatory Visit: Payer: Medicare Other

## 2017-10-10 ENCOUNTER — Other Ambulatory Visit: Payer: Medicare HMO

## 2017-10-13 ENCOUNTER — Other Ambulatory Visit (INDEPENDENT_AMBULATORY_CARE_PROVIDER_SITE_OTHER): Payer: Medicare HMO

## 2017-10-13 DIAGNOSIS — E1165 Type 2 diabetes mellitus with hyperglycemia: Secondary | ICD-10-CM | POA: Diagnosis not present

## 2017-10-13 DIAGNOSIS — Z794 Long term (current) use of insulin: Secondary | ICD-10-CM

## 2017-10-13 DIAGNOSIS — R5383 Other fatigue: Secondary | ICD-10-CM | POA: Diagnosis not present

## 2017-10-13 DIAGNOSIS — R69 Illness, unspecified: Secondary | ICD-10-CM | POA: Diagnosis not present

## 2017-10-13 LAB — COMPREHENSIVE METABOLIC PANEL
ALBUMIN: 4.1 g/dL (ref 3.5–5.2)
ALK PHOS: 40 U/L (ref 39–117)
ALT: 21 U/L (ref 0–53)
AST: 25 U/L (ref 0–37)
BILIRUBIN TOTAL: 0.4 mg/dL (ref 0.2–1.2)
BUN: 18 mg/dL (ref 6–23)
CALCIUM: 9.4 mg/dL (ref 8.4–10.5)
CO2: 30 mEq/L (ref 19–32)
Chloride: 104 mEq/L (ref 96–112)
Creatinine, Ser: 1.26 mg/dL (ref 0.40–1.50)
GFR: 59.48 mL/min — AB (ref >60.00)
Glucose, Bld: 119 mg/dL — ABNORMAL HIGH (ref 70–99)
POTASSIUM: 4 meq/L (ref 3.5–5.1)
Sodium: 141 mEq/L (ref 135–145)
TOTAL PROTEIN: 6.3 g/dL (ref 6.0–8.3)

## 2017-10-13 LAB — TSH: TSH: 1.68 u[IU]/mL (ref 0.35–4.50)

## 2017-10-13 LAB — T4, FREE: FREE T4: 0.89 ng/dL (ref 0.60–1.60)

## 2017-10-13 LAB — HEMOGLOBIN A1C: HEMOGLOBIN A1C: 7.3 % — AB (ref 4.6–6.5)

## 2017-10-15 ENCOUNTER — Ambulatory Visit (INDEPENDENT_AMBULATORY_CARE_PROVIDER_SITE_OTHER): Payer: Medicare HMO | Admitting: Endocrinology

## 2017-10-15 ENCOUNTER — Encounter: Payer: Self-pay | Admitting: Endocrinology

## 2017-10-15 VITALS — BP 144/78 | HR 71 | Ht 67.0 in | Wt 164.8 lb

## 2017-10-15 DIAGNOSIS — F5104 Psychophysiologic insomnia: Secondary | ICD-10-CM | POA: Diagnosis not present

## 2017-10-15 DIAGNOSIS — Z23 Encounter for immunization: Secondary | ICD-10-CM | POA: Diagnosis not present

## 2017-10-15 DIAGNOSIS — E113292 Type 2 diabetes mellitus with mild nonproliferative diabetic retinopathy without macular edema, left eye: Secondary | ICD-10-CM | POA: Diagnosis not present

## 2017-10-15 DIAGNOSIS — E1165 Type 2 diabetes mellitus with hyperglycemia: Secondary | ICD-10-CM

## 2017-10-15 DIAGNOSIS — I10 Essential (primary) hypertension: Secondary | ICD-10-CM

## 2017-10-15 DIAGNOSIS — E113311 Type 2 diabetes mellitus with moderate nonproliferative diabetic retinopathy with macular edema, right eye: Secondary | ICD-10-CM | POA: Diagnosis not present

## 2017-10-15 DIAGNOSIS — H31092 Other chorioretinal scars, left eye: Secondary | ICD-10-CM | POA: Diagnosis not present

## 2017-10-15 DIAGNOSIS — Z794 Long term (current) use of insulin: Secondary | ICD-10-CM

## 2017-10-15 DIAGNOSIS — R69 Illness, unspecified: Secondary | ICD-10-CM | POA: Diagnosis not present

## 2017-10-15 DIAGNOSIS — H43813 Vitreous degeneration, bilateral: Secondary | ICD-10-CM | POA: Diagnosis not present

## 2017-10-15 NOTE — Progress Notes (Signed)
Patient ID: Tony Marshall, male   DOB: [birth year], 76 y.o.   MRN: [id number]   Reason for Appointment:  follow-up of various conditions  History of Present Illness   1. Type 2 DIABETES MELITUS, date of diagnosis:  1992        His blood sugar has been  difficult to control over the last few years because of variability in his blood sugars On basal bolus insulin for a few years and his oral hypoglycemic drugs have been continued Also did not tolerate GLP-1 drugs or symlin because of nausea He is essentially looking like a a type I patient with requiring relatively low amounts of insulin and twice a day dosage of basal insulin He has done somewhat better with Levemir compared to Lantus and is using this twice a day also  He has also had overall much better controlled with adding Invokana in 07/2016  Recent history:  Insulin regimen: Toujeo insulin 6--6 units am.  Mealtime Novolog  insulin: Breakfast = 2-4 units, lunch 0-4  supper 5-6 units based on meal size   His A1c is better  usual at 6.9, previously 7.3  Recent blood sugar patterns and problems:  He has been able to get Invokana again without excessive cost by ordering it from [place]  With this his weight is improving as well as insulin requirement  He is taking about 4-5 minutes less overall on his suppertime insulin,  His fluid intake during the days quite variable and may sometimes have a snack in the morning or before breakfast later before noontime  Again not clear why his blood sugars in the early afternoon maybe occasionally over 200  Also periodically his sugars after supper are over 200  Since he is not able to adjust his insulin pre-meal is sometimes taking his NovoLog right after eating  Although he supposed to have gone to [facility] this was apparently not covered and he thinks he is taking Toujeo twice a day as before  His compliance with limiting higher fat foods is also somewhat  variable  HYPOGLYCEMIA has occurred sporadically with one low reading of 38 waking up and another time was 50 5 in the morning but not later in the day  He says that he is doing more yard work and is overall feeling less tired   Hypoglycemia:  as above  Oral hypoglycemic drugs: Actos, not on Invokana        Side effects from medications: Diarrhea from Metformin       Proper timing of medications in relation to meals: Yes.          Monitors blood glucose: QD    Glucometer: One Touch.          Blood Glucose readings from one touch zoom software download:   Mean values apply above for all meters except median for One Touch  PRE-MEAL Fasting Lunch 7-10 PM  Bedtime Overall  Glucose range:  38-132    76-200    112-2 77    Mean/median: 98 160  141  171  137   POST-MEAL PC Breakfast PC Lunch PC Dinner  Glucose range:  81-259    Mean/median:  172      Meals: 2 meals per day. Snack for lunch    He is eating less fried and carbohydrates overall   Has Bfst at 10-11 am dinner at 7-8 PM     Physical activity: exercise:  yardwork  Wt Readings from Last 3 Encounters:  10/15/17 164 lb 12.8 oz (74.8 kg)  08/06/17 166 lb (75.3 kg)  07/10/17 170 lb (77.1 kg)           LABS:  Lab Results  Component Value Date   HGBA1C 7.3 (H) 10/13/2017   HGBA1C 7.9 (H) 07/07/2017   HGBA1C 6.9 (H) 04/07/2017   Lab Results  Component Value Date   MICROALBUR 0.9 04/07/2017   LDLCALC 78 04/07/2017   CREATININE 1.26 10/13/2017    2. Hypertension:    His blood pressure has been high for about 30 years.  He was evaluated in 1/08 with urine metanephrines and renal artery ultrasound.    Blood pressure readings have been fairly close to normal at home, checking periodically  Also several other medications which he takes in the morning, taking carvedilol twice a day Also now back on Invokana    Lab on 10/13/2017  Component Date Value Ref Range Status  . Hgb A1c MFr Bld 10/13/2017 7.3* 4.6 - 6.5  % Final   Glycemic Control Guidelines for People with Diabetes:Non Diabetic:  <6%Goal of Therapy: <7%Additional Action Suggested:  >8%   . Sodium 10/13/2017 141  135 - 145 mEq/L Final  . Potassium 10/13/2017 4.0  3.5 - 5.1 mEq/L Final  . Chloride 10/13/2017 104  96 - 112 mEq/L Final  . CO2 10/13/2017 30  19 - 32 mEq/L Final  . Glucose, Bld 10/13/2017 119* 70 - 99 mg/dL Final  . BUN 10/13/2017 18  6 - 23 mg/dL Final  . Creatinine, Ser 10/13/2017 1.26  0.40 - 1.50 mg/dL Final  . Total Bilirubin 10/13/2017 0.4  0.2 - 1.2 mg/dL Final  . Alkaline Phosphatase 10/13/2017 40  39 - 117 U/L Final  . AST 10/13/2017 25  0 - 37 U/L Final  . ALT 10/13/2017 21  0 - 53 U/L Final  . Total Protein 10/13/2017 6.3  6.0 - 8.3 g/dL Final  . Albumin 10/13/2017 4.1  3.5 - 5.2 g/dL Final  . Calcium 10/13/2017 9.4  8.4 - 10.5 mg/dL Final  . GFR 10/13/2017 59.48* >60.00 mL/min Final  . TSH 10/13/2017 1.68  0.35 - 4.50 uIU/mL Final  . Free T4 10/13/2017 0.89  0.60 - 1.60 ng/dL Final   Comment: Specimens from patients who are undergoing biotin therapy and /or ingesting biotin supplements may contain high levels of biotin.  The higher biotin concentration in these specimens interferes with this Free T4 assay.  Specimens that contain high levels  of biotin may cause false high results for this Free T4 assay.  Please interpret results in light of the total clinical presentation of the patient.      Allergies as of 10/15/2017      Reactions   Glycopyrrolate Rash      Medication List       Accurate as of 10/15/17  1:06 PM. Always use your most recent med list.          amLODipine 10 MG tablet Commonly known as:  NORVASC TAKE 1 TABLET BY MOUTH DAILY   aspirin 81 MG EC tablet Take 81 mg by mouth daily. Swallow whole.   benazepril 20 MG tablet Commonly known as:  LOTENSIN take 1 tablet by mouth once daily   canagliflozin 100 MG Tabs tablet Commonly known as:  INVOKANA TAKE 1 TABLET BY MOUTH DAILY BEFORE  BREAKFAST   carvedilol 25 MG tablet Commonly known as:  COREG TAKE ONE TABLET BY MOUTH TWICE DAILY  doxazosin 4 MG tablet Commonly known as:  CARDURA TAKE 1 TABLET BY MOUTH AT BEDTIME   DUREZOL 0.05 % Emul Generic drug:  Difluprednate Place 1 drop into the right eye 2 (two) times daily.   esomeprazole 40 MG capsule Commonly known as:  NEXIUM Take 40 mg by mouth daily at 12 noon.   FLORASTOR 250 MG capsule Generic drug:  saccharomyces boulardii Take 250 mg by mouth 2 (two) times daily.   glucagon 1 MG injection Commonly known as:  GLUCAGON EMERGENCY Inject 1 mg into the vein once as needed.   HUMALOG KWIKPEN 100 UNIT/ML KiwkPen Generic drug:  insulin lispro INJECT 3-9 UNITS AT BREAKFAST, 3-4 UNITS AT LUNCH AND 6-9 UNITS AT DINNER   insulin degludec 100 UNIT/ML Sopn FlexTouch Pen Commonly known as:  TRESIBA FLEXTOUCH Give 12 units daily. If fasting blood sugars are over 140 increase dosage up to 15 units daily as directed.   ONE TOUCH ULTRA TEST test strip Generic drug:  glucose blood USE ONE STRIP TO CHECK GLUCOSE THREE TIMES DAILY   glucose blood test strip Commonly known as:  ONETOUCH VERIO Use to check blood sugars 3 times daily   pioglitazone 15 MG tablet Commonly known as:  ACTOS TAKE 1 TABLET BY MOUTH ONCE DAILY   rosuvastatin 5 MG tablet Commonly known as:  CRESTOR Take 5 mg by mouth every morning.   sildenafil 20 MG tablet Commonly known as:  REVATIO TAKE 5 TABLETS BY MOUTH AS NEEDED once a week.   TOUJEO SOLOSTAR 300 UNIT/ML Sopn Generic drug:  Insulin Glargine Inject into the skin daily. Inject 6 units every morning and 6 units every evening   zolpidem 12.5 MG CR tablet Commonly known as:  AMBIEN CR Take 1 tablet (12.5 mg total) by mouth at bedtime as needed for sleep.       Allergies:  Allergies  Allergen Reactions  . Glycopyrrolate Rash    Past Medical History:  Diagnosis Date  . CLL (chronic lymphocytic leukemia) ([place])   .  Diabetes mellitus without complication ([place])   . GERD (gastroesophageal reflux disease)   . Hyperlipidemia   . Hypertension   . Lymphocytosis   . Sleep apnea     Past Surgical History:  Procedure Laterality Date  . BLEPHAROPLASTY    . COLONOSCOPY  2012  . HEMORRHOID SURGERY     Sclerotherapy  . KNEE SURGERY Right 2002  . POLYPECTOMY    . SHOULDER SURGERY Right 2007    Family History  Problem Relation Age of Onset  . Kidney disease Mother   . CAD Maternal Aunt   . Colon cancer Neg Hx   . Esophageal cancer Neg Hx   . Rectal cancer Neg Hx   . Stomach cancer Neg Hx     Social History:  reports that he has never smoked. He quit smokeless tobacco use about 4 years ago. His smokeless tobacco use included Chew. He reports that he drinks about 3.6 oz of alcohol per week . He reports that he does not use drugs.      ROS   HYPERLIPIDEMIA: The lipid abnormality consists of elevated LDL which is very well controlled with 10mg  Crestor His dose was increased in January  Lab Results  Component Value Date   CHOL 136 04/07/2017   HDL 46.00 04/07/2017   LDLCALC 78 04/07/2017   TRIG 63.0 04/07/2017   CHOLHDL 3 04/07/2017    Sleep apnea, previously treated with CPAP, he has started back on this  about a week He was previously using it regularly but apparently having a lot of fatigue for quite some time  Has had less fatigue recently, thyroid functions are normal  INSOMNIA: This is chronic and he has been taking Ambien.   Because of late insomnia was switched from regular Ambien to the Ambien CR He Is currently taking a half of the 12.5 mg dose, had more lower with the 12.5 mg dose but still feels like he takes a couple of hours to wake up in the morning    Examination:   BP 140/80   Pulse 72   Ht 5\' 7"  (1.702 m)   Wt 170 lb (77.1 kg)   SpO2 97%   PF (!) 9 L/min   BMI 26.63 kg/m   Body mass index is 26.63 kg/m.    ASSESSMENT/ PLAN:   Diabetes type 2, Nonobese See  history of present illness for detailed discussion of his current management, blood sugar patterns and problems identified  With restarting Invokana he appears to be having overall better control and also requiring less mealtime insulin especially at his main meal in the evening Also is able to do a little more physical activity recently with the cooler weather Blood sugars are quite variable after meals based on his carbohydrate intake and in the evenings difficult to sort out which readings are before or after eating, mostly not checking postprandial readings Overall tends to have higher POSTPRANDIAL readings, not able to accurately adjust his insulin without carbohydrate counting for various meals  Since hypoglycemia has occurred only rarely he is probably getting appropriate amount of basal insulin He may still benefit from differing doses of Toujeo in the morning and evening since 19 blood sugars are generally lower  Recommended the following today  He will Continue to get the Invokana from [place]  Trial of 7 units Toujeo in the morning and reduce the evening dose of 5 units  He needs to check blood sugars more consistently on waking up  Does need to try checking his blood sugars after evening meals more often  Consider consultation with dietitian    HYPERTENSION: Well controlled    Insomnia: Discussed that on his next prescription we will use 6.25 mg of Ambien CR instead of his cutting the 12.5 mg in half    [facility] 10/15/2017, 1:06 PM

## 2017-10-15 NOTE — Patient Instructions (Signed)
Toujeo 7 units in am and 5 at [employer] [employer]

## 2017-10-16 ENCOUNTER — Other Ambulatory Visit: Payer: Self-pay

## 2017-10-16 ENCOUNTER — Telehealth: Payer: Self-pay | Admitting: Endocrinology

## 2017-10-16 MED ORDER — ZOLPIDEM TARTRATE ER 6.25 MG PO TBCR: 6.2500 mg | EXTENDED_RELEASE_TABLET | Freq: Every evening | ORAL | 0 refills | Status: DC | PRN

## 2017-10-16 NOTE — Telephone Encounter (Signed)
Zolpidem need refill 90 day supply send to [facility], [address], patient stated medication was lowered to 5 mg, please advise

## 2017-10-16 NOTE — Telephone Encounter (Signed)
Called patient and let him know that I have sent the new prescription for the 6.25 mg of Zolpidem to the [facility] for him.

## 2017-10-20 ENCOUNTER — Telehealth: Payer: Self-pay | Admitting: Internal Medicine

## 2017-10-20 NOTE — Telephone Encounter (Signed)
Faxed office note to [facility] [phone]

## 2017-11-03 ENCOUNTER — Telehealth: Payer: Self-pay | Admitting: Endocrinology

## 2017-11-03 ENCOUNTER — Other Ambulatory Visit: Payer: Self-pay

## 2017-11-03 MED ORDER — DOXAZOSIN MESYLATE 4 MG PO TABS: 4.0000 mg | ORAL_TABLET | Freq: Every day | ORAL | 0 refills | Status: DC

## 2017-11-03 NOTE — Telephone Encounter (Signed)
Called patient and let a young lady know that I have sent in his refill for him to [facility].

## 2017-11-03 NOTE — Telephone Encounter (Signed)
Patient needs prescription for Doxazosin 4 mg sent to [facility] in [place] day supply

## 2017-11-05 ENCOUNTER — Other Ambulatory Visit (HOSPITAL_BASED_OUTPATIENT_CLINIC_OR_DEPARTMENT_OTHER): Payer: Medicare HMO

## 2017-11-05 ENCOUNTER — Ambulatory Visit: Payer: Medicare HMO | Admitting: Internal Medicine

## 2017-11-05 ENCOUNTER — Telehealth: Payer: Self-pay | Admitting: Internal Medicine

## 2017-11-05 ENCOUNTER — Encounter: Payer: Self-pay | Admitting: Internal Medicine

## 2017-11-05 VITALS — BP 127/65 | HR 63 | Temp 97.8°F | Resp 18 | Ht 67.0 in | Wt 165.2 lb

## 2017-11-05 DIAGNOSIS — C9110 Chronic lymphocytic leukemia of B-cell type not having achieved remission: Secondary | ICD-10-CM

## 2017-11-05 DIAGNOSIS — D72820 Lymphocytosis (symptomatic): Secondary | ICD-10-CM

## 2017-11-05 LAB — CBC WITH DIFFERENTIAL/PLATELET
BASO%: 0.2 % (ref 0.0–2.0)
Basophils Absolute: 0.2 10e3/uL — ABNORMAL HIGH (ref 0.0–0.1)
EOS%: 0.4 % (ref 0.0–7.0)
Eosinophils Absolute: 0.3 10e3/uL (ref 0.0–0.5)
HCT: 40.8 % (ref 38.4–49.9)
HGB: 13.2 g/dL (ref 13.0–17.1)
LYMPH%: 92.4 % — ABNORMAL HIGH (ref 14.0–49.0)
MCH: 31 pg (ref 27.2–33.4)
MCHC: 32.4 g/dL (ref 32.0–36.0)
MCV: 95.8 fL (ref 79.3–98.0)
MONO#: 1.4 10e3/uL — ABNORMAL HIGH (ref 0.1–0.9)
MONO%: 2 % (ref 0.0–14.0)
NEUT#: 3.6 10e3/uL (ref 1.5–6.5)
NEUT%: 5 % — ABNORMAL LOW (ref 39.0–75.0)
Platelets: 205 10e3/uL (ref 140–400)
RBC: 4.26 10e6/uL (ref 4.20–5.82)
RDW: 14 % (ref 11.0–14.6)
WBC: 71.5 10e3/uL (ref 4.0–10.3)
lymph#: 66.1 10e3/uL — ABNORMAL HIGH (ref 0.9–3.3)
nRBC: 0 % (ref 0–0)

## 2017-11-05 LAB — LACTATE DEHYDROGENASE: LDH: 246 U/L — ABNORMAL HIGH (ref 125–245)

## 2017-11-05 LAB — TECHNOLOGIST REVIEW

## 2017-11-05 NOTE — Telephone Encounter (Signed)
Scheduled appt per 11/21 los - Gave patient aVS and calender per los. Lab and f/u in 6 months.

## 2017-11-05 NOTE — Progress Notes (Signed)
[facility] [phone]   [phone]  OFFICE PROGRESS NOTE  Elayne Snare, MD [address] [place] [address]  DIAGNOSIS: Chronic lymphocytic leukemia diagnosed in October 2014.  PRIOR THERAPY: None  CURRENT THERAPY: Observation.  INTERVAL HISTORY: Tony Marshall 76 y.o. male returns to the clinic today for 23-month follow-up visit accompanied by his wife.  The patient is feeling fine today with no specific complaints.  He denied having any chest pain, shortness of breath, cough or hemoptysis.  He denied having any weight loss or night sweats.  The patient has no palpable lymphadenopathy.  He denied having any bleeding, bruises or ecchymosis.  Is here today for evaluation and repeat blood work.   MEDICAL HISTORY: Past Medical History:  Diagnosis Date  . CLL (chronic lymphocytic leukemia) ([place])   . Diabetes mellitus without complication ([place])   . GERD (gastroesophageal reflux disease)   . Hyperlipidemia   . Hypertension   . Lymphocytosis   . Sleep apnea     ALLERGIES:  is allergic to glycopyrrolate.  MEDICATIONS:  Current Outpatient Medications  Medication Sig Dispense Refill  . amLODipine (NORVASC) 10 MG tablet TAKE 1 TABLET BY MOUTH DAILY 90 tablet 0  . aspirin 81 MG EC tablet Take 81 mg by mouth daily. Swallow whole.    . benazepril (LOTENSIN) 20 MG tablet take 1 tablet by mouth once daily 90 tablet 0  . canagliflozin (INVOKANA) 100 MG TABS tablet TAKE 1 TABLET BY MOUTH DAILY BEFORE BREAKFAST 90 tablet 3  . carvedilol (COREG) 25 MG tablet TAKE ONE TABLET BY MOUTH TWICE DAILY  180 tablet 0  . doxazosin (CARDURA) 4 MG tablet Take 1 tablet (4 mg total) at bedtime by mouth. 90 tablet 0  . DUREZOL 0.05 % EMUL Place 1 drop into the right eye 2 (two) times daily.    Marland Kitchen esomeprazole (NEXIUM) 40 MG capsule Take 40 mg by mouth daily at 12 noon.    Marland Kitchen glucagon (GLUCAGON EMERGENCY) 1 MG injection Inject 1 mg into the vein once as  needed. 1 each 5  . glucose blood (ONETOUCH VERIO) test strip Use to check blood sugars 3 times daily 100 each 3  . HUMALOG KWIKPEN 100 UNIT/ML KiwkPen INJECT 3-9 UNITS AT BREAKFAST, 3-4 UNITS AT LUNCH AND 6-9 UNITS AT DINNER 45 mL 0  . insulin degludec (TRESIBA FLEXTOUCH) 100 UNIT/ML SOPN FlexTouch Pen Give 12 units daily. If fasting blood sugars are over 140 increase dosage up to 15 units daily as directed. 15 mL 3  . ONE TOUCH ULTRA TEST test strip USE ONE STRIP TO CHECK GLUCOSE THREE TIMES DAILY 100 each 3  . pioglitazone (ACTOS) 15 MG tablet TAKE 1 TABLET BY MOUTH ONCE DAILY 90 tablet 0  . rosuvastatin (CRESTOR) 5 MG tablet Take 5 mg by mouth every morning.    . saccharomyces boulardii (FLORASTOR) 250 MG capsule Take 250 mg by mouth 2 (two) times daily.    . sildenafil (REVATIO) 20 MG tablet TAKE 5 TABLETS BY MOUTH AS NEEDED once a week. 30 tablet 2  . zolpidem (AMBIEN CR) 12.5 MG CR tablet Take 1 tablet (12.5 mg total) by mouth at bedtime as needed for sleep. 30 tablet 3  . zolpidem (AMBIEN CR) 6.25 MG CR tablet Take 1 tablet (6.25 mg total) by mouth at bedtime as needed for sleep. 30 tablet 0   Current Facility-Administered Medications  Medication Dose Route Frequency Provider Last Rate Last  Dose  . 0.9 %  sodium chloride infusion  500 mL Intravenous Continuous Pyrtle, Lajuan Lines, MD        SURGICAL HISTORY:  Past Surgical History:  Procedure Laterality Date  . BLEPHAROPLASTY    . COLONOSCOPY  2012  . HEMORRHOID SURGERY     Sclerotherapy  . KNEE SURGERY Right 2002  . POLYPECTOMY    . SHOULDER SURGERY Right 2007    REVIEW OF SYSTEMS:  A comprehensive review of systems was negative.   PHYSICAL EXAMINATION: General appearance: alert, cooperative and no distress Head: Normocephalic, without obvious abnormality, atraumatic Neck: no adenopathy, no JVD, supple, symmetrical, trachea midline and thyroid not enlarged, symmetric, no tenderness/mass/nodules Lymph nodes: Cervical,  supraclavicular, and axillary nodes normal. Resp: clear to auscultation bilaterally Back: symmetric, no curvature. ROM normal. No CVA tenderness. Cardio: regular rate and rhythm, S1, S2 normal, no murmur, click, rub or gallop GI: soft, non-tender; bowel sounds normal; no masses,  no organomegaly Extremities: extremities normal, atraumatic, no cyanosis or edema  ECOG PERFORMANCE STATUS: 1 - Symptomatic but completely ambulatory  Blood pressure 127/65, pulse 63, temperature 97.8 F (36.6 C), temperature source Oral, resp. rate 18, height 5\' 7"  (1.702 m), weight 165 lb 3.2 oz (74.9 kg), SpO2 100 %.  LABORATORY DATA: Lab Results  Component Value Date   WBC 71.5 (HH) 11/05/2017   HGB 13.2 11/05/2017   HCT 40.8 11/05/2017   MCV 95.8 11/05/2017   PLT 205 11/05/2017      Chemistry      Component Value Date/Time   NA 141 10/13/2017 1412   NA 141 08/06/2017 1306   K 4.0 10/13/2017 1412   K 4.3 08/06/2017 1306   CL 104 10/13/2017 1412   CO2 30 10/13/2017 1412   CO2 25 08/06/2017 1306   BUN 18 10/13/2017 1412   BUN 22.9 08/06/2017 1306   CREATININE 1.26 10/13/2017 1412   CREATININE 1.3 08/06/2017 1306      Component Value Date/Time   CALCIUM 9.4 10/13/2017 1412   CALCIUM 9.3 08/06/2017 1306   ALKPHOS 40 10/13/2017 1412   ALKPHOS 47 08/06/2017 1306   AST 25 10/13/2017 1412   AST 20 08/06/2017 1306   ALT 21 10/13/2017 1412   ALT 19 08/06/2017 1306   BILITOT 0.4 10/13/2017 1412   BILITOT 0.46 08/06/2017 1306       RADIOGRAPHIC STUDIES: No results found.  ASSESSMENT AND PLAN:  This is a very pleasant 76 years old white male with chronic lymphocytic leukemia currently on observation. The patient has no complaints today. CBC today showed white blood count 71,500.  This has been stable from the previous blood work 3 months ago. I discussed the lab results with the patient and his wife.  I recommended for him to continue on observation with repeat CBC, comprehensive metabolic  panel and LDH in 6 months. The patient was advised to call immediately if he has any concerning symptoms in the interval. The patient voices understanding of current disease status and treatment options and is in agreement with the current care plan. All questions were answered. The patient knows to call the clinic with any problems, questions or concerns. We can certainly see the patient much sooner if necessary. I spent 10 minutes counseling the patient face to face. The total time spent in the appointment was 15 minutes.  Disclaimer: This note was dictated with voice recognition software. Similar sounding words can inadvertently be transcribed and may not be corrected upon review.

## 2017-11-13 DIAGNOSIS — R69 Illness, unspecified: Secondary | ICD-10-CM | POA: Diagnosis not present

## 2017-11-18 ENCOUNTER — Telehealth: Payer: Self-pay | Admitting: *Deleted

## 2017-11-18 DIAGNOSIS — R69 Illness, unspecified: Secondary | ICD-10-CM | POA: Diagnosis not present

## 2017-11-18 NOTE — Telephone Encounter (Deleted)
Labs have been ordered per RB. Nothing further was needed.

## 2017-11-18 NOTE — Telephone Encounter (Signed)
Error

## 2017-11-18 NOTE — Telephone Encounter (Deleted)
-----   Message from Collene Gobble, MD sent at 11/18/2017  8:43 AM EST ----- Regarding: FW: labs  Tony Marshall need to order these labs at the [facility]. Sending this so we do not forget   Thanks, RSB  ----- Message ----- From: Ardeen Garland, RN Sent: 11/05/2017   1:32 PM To: Collene Gobble, MD Subject: labs                                           She is scheduled to see you on 12/4 at 2 pm.  Can you please order a cbc/diff and cmet and have it drawn that day? That will eliminate a trip here .   Radiology will need the results before her CT scan on 12/27 . Thanks [place].

## 2017-11-20 ENCOUNTER — Other Ambulatory Visit: Payer: Self-pay | Admitting: Endocrinology

## 2017-12-10 ENCOUNTER — Other Ambulatory Visit: Payer: Self-pay | Admitting: Endocrinology

## 2017-12-22 ENCOUNTER — Telehealth: Payer: Self-pay | Admitting: Endocrinology

## 2017-12-22 ENCOUNTER — Other Ambulatory Visit: Payer: Self-pay

## 2017-12-22 MED ORDER — ZOLPIDEM TARTRATE ER 6.25 MG PO TBCR: 6.2500 mg | EXTENDED_RELEASE_TABLET | Freq: Every evening | ORAL | 0 refills | Status: DC | PRN

## 2017-12-22 NOTE — Telephone Encounter (Signed)
This has been ordered 

## 2017-12-22 NOTE — Telephone Encounter (Signed)
Patient needs script for 90 supply of Zolpidem sent to [facility] in [place]

## 2017-12-24 ENCOUNTER — Other Ambulatory Visit: Payer: Self-pay

## 2018-01-02 ENCOUNTER — Other Ambulatory Visit: Payer: Self-pay | Admitting: Endocrinology

## 2018-01-09 ENCOUNTER — Other Ambulatory Visit (INDEPENDENT_AMBULATORY_CARE_PROVIDER_SITE_OTHER): Payer: Medicare HMO

## 2018-01-09 DIAGNOSIS — Z794 Long term (current) use of insulin: Secondary | ICD-10-CM

## 2018-01-09 DIAGNOSIS — E1165 Type 2 diabetes mellitus with hyperglycemia: Secondary | ICD-10-CM | POA: Diagnosis not present

## 2018-01-09 LAB — BASIC METABOLIC PANEL
BUN: 21 mg/dL (ref 6–23)
CO2: 30 mEq/L (ref 19–32)
Calcium: 9.3 mg/dL (ref 8.4–10.5)
Chloride: 104 mEq/L (ref 96–112)
Creatinine, Ser: 1.2 mg/dL (ref 0.40–1.50)
GFR: 62.88 mL/min (ref >60.00)
Glucose, Bld: 213 mg/dL — ABNORMAL HIGH (ref 70–99)
Potassium: 4.1 mEq/L (ref 3.5–5.1)
SODIUM: 141 meq/L (ref 135–145)

## 2018-01-09 LAB — HEMOGLOBIN A1C: Hgb A1c MFr Bld: 7.6 % — ABNORMAL HIGH (ref 4.6–6.5)

## 2018-01-12 ENCOUNTER — Telehealth: Payer: Self-pay | Admitting: Endocrinology

## 2018-01-12 ENCOUNTER — Other Ambulatory Visit: Payer: Self-pay | Admitting: Endocrinology

## 2018-01-12 DIAGNOSIS — J029 Acute pharyngitis, unspecified: Secondary | ICD-10-CM

## 2018-01-12 DIAGNOSIS — R69 Illness, unspecified: Secondary | ICD-10-CM | POA: Diagnosis not present

## 2018-01-12 NOTE — Telephone Encounter (Signed)
He can go to the [facility] lab and get the strep test done.  If his blood sugars are high in the morning he can increase his longer acting insulin by 2-3 units

## 2018-01-12 NOTE — Telephone Encounter (Signed)
Pt has an appt on 1/30 but right now is under the weather with a low grade fever of 99.4, he has been exposed to strep throat, he has a bad cough no sputum, not eating, lightheaded when he gets up, blood sugar has not gone out of wack even though he is not eating

## 2018-01-12 NOTE — Telephone Encounter (Signed)
Spoke to the patients wife and gave her Dr. Ronnie Derby advice- patient will go to [facility] tomorrow he is feeling too bad today

## 2018-01-14 ENCOUNTER — Encounter: Payer: Self-pay | Admitting: Endocrinology

## 2018-01-14 ENCOUNTER — Other Ambulatory Visit: Payer: Self-pay

## 2018-01-14 ENCOUNTER — Ambulatory Visit: Payer: Medicare HMO | Admitting: Endocrinology

## 2018-01-14 VITALS — BP 96/55 | HR 57 | Ht 67.0 in | Wt 157.8 lb

## 2018-01-14 DIAGNOSIS — R05 Cough: Secondary | ICD-10-CM

## 2018-01-14 DIAGNOSIS — E118 Type 2 diabetes mellitus with unspecified complications: Secondary | ICD-10-CM

## 2018-01-14 DIAGNOSIS — I9589 Other hypotension: Secondary | ICD-10-CM | POA: Diagnosis not present

## 2018-01-14 DIAGNOSIS — R059 Cough, unspecified: Secondary | ICD-10-CM

## 2018-01-14 MED ORDER — AMLODIPINE BESYLATE 10 MG PO TABS: 10.0000 mg | ORAL_TABLET | Freq: Every day | ORAL | 3 refills | Status: DC

## 2018-01-14 MED ORDER — ROSUVASTATIN CALCIUM 5 MG PO TABS: 5.0000 mg | ORAL_TABLET | Freq: Every morning | ORAL | 3 refills | Status: DC

## 2018-01-14 NOTE — Patient Instructions (Addendum)
Stop Invokana for 3 days  Lunch 3-5 at lunch  Supper 7-9 for supper   Tresiba 13 u in am  Ambien 1/2 for 3 days

## 2018-01-14 NOTE — Progress Notes (Signed)
Patient ID: Tony Marshall, male   DOB: 12/08/1941, 77 y.o.   MRN: [id number]   Reason for Appointment:  follow-up of various conditions  History of Present Illness   1. ACUTE respiratory infection  He is complaining about cough for the last 4-5 days This has been mostly dry and he is using Delsym His cough is getting somewhat better He says he has had some fever initially but now down to 99 No nasal congestion, sinus headache, sore throat or shortness of breath He says his daughter had strep throat recently.   Type 2 DIABETES MELITUS, date of diagnosis:  1992        His blood sugar has been  difficult to control over the last few years because of variability in his blood sugars On basal bolus insulin for a few years and his oral hypoglycemic drugs have been continued Also did not tolerate GLP-1 drugs or symlin because of nausea He is essentially looking like a a type I patient with requiring relatively low amounts of insulin and twice a day dosage of basal insulin  He has also had overall much better controlled with adding Invokana in 07/2016  Recent history:  Insulin regimen: TRESIBA insulin 7-7 units twice a day.  Mealtime Novolog  insulin: Breakfast = 2-4 units, lunch 0-4  supper 5-6 units based on meal size   His A1c is slightly higher at 7.3 compared to 6.9   Recent blood sugar patterns and problems:  He has had high reading once after lunch and dinner as reviewed by his blood sugar readings on the monitor download  He has not eating much at breakfast in the morning but has variable intake of carbohydrate at lunchtime  His dinner is the largest meal  HIGHEST blood sugar was 308 at bedtime  Since he is not able to adjust his insulin pre-meal is sometimes taking his NovoLog right after eating  More recently has now been taking TRESIBA instead of Toujeo and on his own has increased the dose by 1 unit twice a day  His compliance with watching higher fat  foods is also somewhat variable  HYPOGLYCEMIA has occurred sporadically with one low reading of 56  waking up and another time was 68 around 9:30 PM   Hypoglycemia:  as above  Oral hypoglycemic drugs: Actos, Invokana          Side effects from medications: Diarrhea from Metformin          Monitors blood glucose: up to twice daily    Glucometer: One Touch.          Blood Glucose readings from one touch zoom software download:   Mean values apply above for all meters except median for One Touch  PRE-MEAL Fasting Lunch Dinner Bedtime Overall  Glucose range: 56-159      Mean/median: 115    148   POST-MEAL PC Breakfast PC Lunch PC Dinner  Glucose range:  1 74-258 123-308  Mean/median:  203 179    Meals: 2 meals per day. Snack for lunch    He is eating less fried and carbohydrates overall   Has Bfst at 10-11 am dinner at 7-8 PM     Physical activity: exercise:  yardwork    Wt Readings from Last 3 Encounters:  11/05/17 165 lb 3.2 oz (74.9 kg)  10/15/17 164 lb 12.8 oz (74.8 kg)  08/06/17 166 lb (75.3 kg)  LABS:  Lab Results  Component Value Date   HGBA1C 7.6 (H) 01/09/2018   HGBA1C 7.3 (H) 10/13/2017   HGBA1C 7.9 (H) 07/07/2017   Lab Results  Component Value Date   MICROALBUR 0.9 04/07/2017   LDLCALC 78 04/07/2017   CREATININE 1.20 01/09/2018    2. Hypertension:    His blood pressure has been high for about 30 years.  He was evaluated in 1/08 with urine metanephrines and renal artery ultrasound.    Blood pressure readings have been fairly close to normal at home, checking periodically  Also several other medications which he takes in the morning, taking carvedilol twice a day Also now back on Invokana    Lab on 01/09/2018  Component Date Value Ref Range Status  . Sodium 01/09/2018 141  135 - 145 mEq/L Final  . Potassium 01/09/2018 4.1  3.5 - 5.1 mEq/L Final  . Chloride 01/09/2018 104  96 - 112 mEq/L Final  . CO2 01/09/2018 30  19 - 32 mEq/L Final    . Glucose, Bld 01/09/2018 213* 70 - 99 mg/dL Final  . BUN 01/09/2018 21  6 - 23 mg/dL Final  . Creatinine, Ser 01/09/2018 1.20  0.40 - 1.50 mg/dL Final  . Calcium 01/09/2018 9.3  8.4 - 10.5 mg/dL Final  . GFR 01/09/2018 62.88  >60.00 mL/min Final  . Hgb A1c MFr Bld 01/09/2018 7.6* 4.6 - 6.5 % Final   Glycemic Control Guidelines for People with Diabetes:Non Diabetic:  <6%Goal of Therapy: <7%Additional Action Suggested:  >8%     Allergies as of 01/14/2018      Reactions   Glycopyrrolate Rash      Medication List        Accurate as of 01/14/18  1:21 PM. Always use your most recent med list.          amLODipine 10 MG tablet Commonly known as:  NORVASC TAKE 1 TABLET BY MOUTH DAILY   aspirin 81 MG EC tablet Take 81 mg by mouth daily. Swallow whole.   benazepril 20 MG tablet Commonly known as:  LOTENSIN TAKE ONE TABLET BY MOUTH ONE TIME DAILY   canagliflozin 100 MG Tabs tablet Commonly known as:  INVOKANA TAKE 1 TABLET BY MOUTH DAILY BEFORE BREAKFAST   carvedilol 25 MG tablet Commonly known as:  COREG TAKE ONE TABLET BY MOUTH TWICE DAILY   doxazosin 4 MG tablet Commonly known as:  CARDURA Take 1 tablet (4 mg total) at bedtime by mouth.   DUREZOL 0.05 % Emul Generic drug:  Difluprednate Place 1 drop into the right eye 2 (two) times daily.   esomeprazole 40 MG capsule Commonly known as:  NEXIUM Take 40 mg by mouth daily at 12 noon.   FLORASTOR 250 MG capsule Generic drug:  saccharomyces boulardii Take 250 mg by mouth 2 (two) times daily.   glucagon 1 MG injection Commonly known as:  GLUCAGON EMERGENCY Inject 1 mg into the vein once as needed.   HUMALOG KWIKPEN 100 UNIT/ML KiwkPen Generic drug:  insulin lispro INJECT 3-9 UNITS AT BREAKFAST, 3-4 UNITS AT LUNCH AND 6-9 UNITS AT DINNER   insulin degludec 100 UNIT/ML Sopn FlexTouch Pen Commonly known as:  TRESIBA FLEXTOUCH Give 12 units daily. If fasting blood sugars are over 140 increase dosage up to 15 units  daily as directed.   ONE TOUCH ULTRA TEST test strip Generic drug:  glucose blood USE ONE STRIP TO CHECK GLUCOSE THREE TIMES DAILY   ONETOUCH VERIO test strip Generic drug:  glucose blood USE 1 STRIP TO CHECK GLUCOSE THREE TIMES DAILY   pioglitazone 15 MG tablet Commonly known as:  ACTOS TAKE ONE TABLET BY MOUTH ONE TIME DAILY   rosuvastatin 5 MG tablet Commonly known as:  CRESTOR Take 5 mg by mouth every morning.   sildenafil 20 MG tablet Commonly known as:  REVATIO TAKE 5 TABLETS BY MOUTH AS NEEDED once a week.   zolpidem 6.25 MG CR tablet Commonly known as:  AMBIEN CR Take 1 tablet (6.25 mg total) by mouth at bedtime as needed. for sleep       Allergies:  Allergies  Allergen Reactions  . Glycopyrrolate Rash    Past Medical History:  Diagnosis Date  . CLL (chronic lymphocytic leukemia) ([place])   . Diabetes mellitus without complication ([place])   . GERD (gastroesophageal reflux disease)   . Hyperlipidemia   . Hypertension   . Lymphocytosis   . Sleep apnea     Past Surgical History:  Procedure Laterality Date  . BLEPHAROPLASTY    . COLONOSCOPY  2012  . HEMORRHOID SURGERY     Sclerotherapy  . KNEE SURGERY Right 2002  . POLYPECTOMY    . SHOULDER SURGERY Right 2007    Family History  Problem Relation Age of Onset  . Kidney disease Mother   . CAD Maternal Aunt   . Colon cancer Neg Hx   . Esophageal cancer Neg Hx   . Rectal cancer Neg Hx   . Stomach cancer Neg Hx     Social History:  reports that  has never smoked. He quit smokeless tobacco use about 5 years ago. His smokeless tobacco use included chew. He reports that he drinks about 3.6 oz of alcohol per week. He reports that he does not use drugs.      ROS   HYPERLIPIDEMIA: The lipid abnormality consists of elevated LDL which is very well controlled with 10mg  Crestor   Lab Results  Component Value Date   CHOL 136 04/07/2017   HDL 46.00 04/07/2017   LDLCALC 78 04/07/2017   TRIG 63.0 04/07/2017    CHOLHDL 3 04/07/2017    Sleep apnea, previously treated with CPAP, he has started back on this about a week He was previously using it regularly but apparently having a lot of fatigue for quite some time  Has had less fatigue recently, thyroid functions are normal  INSOMNIA: This is chronic and he has been taking Ambien .   Because of late insomnia was switched from regular Ambien to the Ambien CR He Is currently taking 6.25 mg  His wife says that the last couple of nights he has been talking in his sleep    Examination:   BP (!) 96/55 (BP Location: Left Arm, Patient Position: Sitting, Cuff Size: Large)   Pulse (!) 57   Ht 5\' 7"  (1.702 m)   Wt 157 lb 12.8 oz (71.6 kg)   BMI 24.71 kg/m   Blood pressure checked second time was 92/60 standing Mucous membranes moist Pharynx normal Lungs clear Heart sounds normal  ASSESSMENT/ PLAN:   1. RESPIRATORY INFECTION   He has a viral infection which is improving with only residual cough Exam is unremarkable and he has no fever now He will continue to use Delsym  2. DIABETES type 2, Nonobese See history of present illness for detailed discussion of his current management, blood sugar patterns and problems identified  With using TRESIBA his fasting readings appear to be more stable now and only once  low-normal A1c is slightly higher at 7.6 however  Overall tends to have higher POSTPRANDIAL readings both after lunch and dinner and he is not getting enough insulin especially at lunch Since he has only a couple of good readings at bedtime he needs to increase his suppertime dose for most of his meals His wife is also present and this was discussed in detail   Recommended the following today  He needs to increase his mealtime doses by at least 1-2 units at lunch and 2-3 units at night  Reduce total Tresiba by one unit and he can switch to once a day 13 units in the morning  Discussed adjusting further based on fasting  readings  He needs to check blood sugars more consistently on waking up  Consistent diet with low fat intake  Consider consultation with dietitian   HYPOTENSION: his blood pressure is rather low and he is feeling lightheaded This is likely to be from recent respiratory infection, somewhat decreased oral intake overall and probably some dehydration related to continuing Melvina also He will stop his Invokana for 3 days Not to take any blood pressure medications tonight and resume only when blood pressure starts getting relatively higher Bring blood pressure monitor for comparison on the next visit   Insomnia: recently having some sleep talking from taking Ambien along with being acutely ill He will need to at least cut back his Ambien to half a tablet for the next couple of nights  Total visit time for evaluation and management of multiple problems in counseling = 25 minutes  Darald Uzzle 01/14/2018, 1:21 PM

## 2018-01-29 ENCOUNTER — Other Ambulatory Visit: Payer: Self-pay | Admitting: Endocrinology

## 2018-02-02 DIAGNOSIS — M659 Synovitis and tenosynovitis, unspecified: Secondary | ICD-10-CM | POA: Diagnosis not present

## 2018-02-03 ENCOUNTER — Telehealth: Payer: Self-pay | Admitting: Internal Medicine

## 2018-02-03 NOTE — Telephone Encounter (Signed)
Patient called in to reschedule  °

## 2018-02-10 ENCOUNTER — Other Ambulatory Visit: Payer: Self-pay | Admitting: Endocrinology

## 2018-03-09 ENCOUNTER — Other Ambulatory Visit: Payer: Self-pay | Admitting: Endocrinology

## 2018-03-11 ENCOUNTER — Telehealth: Payer: Self-pay | Admitting: Endocrinology

## 2018-03-11 MED ORDER — PIOGLITAZONE HCL 15 MG PO TABS: 15.0000 mg | ORAL_TABLET | Freq: Every day | ORAL | 0 refills | Status: DC

## 2018-03-11 NOTE — Telephone Encounter (Signed)
Rx sent. See meds. Pt informed.  

## 2018-03-11 NOTE — Telephone Encounter (Signed)
pioglitazone (ACTOS) 15 MG tablet  Patients wife stated patient is needing a 90 day supply of this. She states that [facility] states that the prescription they are getting is for 30 day..   Please call patient once this has been sent.      [facility] # [place], [place]

## 2018-03-16 DIAGNOSIS — R69 Illness, unspecified: Secondary | ICD-10-CM | POA: Diagnosis not present

## 2018-03-23 DIAGNOSIS — H52223 Regular astigmatism, bilateral: Secondary | ICD-10-CM | POA: Diagnosis not present

## 2018-03-23 DIAGNOSIS — H524 Presbyopia: Secondary | ICD-10-CM | POA: Diagnosis not present

## 2018-03-23 DIAGNOSIS — H5213 Myopia, bilateral: Secondary | ICD-10-CM | POA: Diagnosis not present

## 2018-03-23 DIAGNOSIS — E113313 Type 2 diabetes mellitus with moderate nonproliferative diabetic retinopathy with macular edema, bilateral: Secondary | ICD-10-CM | POA: Diagnosis not present

## 2018-03-23 DIAGNOSIS — H11041 Peripheral pterygium, stationary, right eye: Secondary | ICD-10-CM | POA: Diagnosis not present

## 2018-03-23 DIAGNOSIS — H04123 Dry eye syndrome of bilateral lacrimal glands: Secondary | ICD-10-CM | POA: Diagnosis not present

## 2018-03-23 DIAGNOSIS — H26491 Other secondary cataract, right eye: Secondary | ICD-10-CM | POA: Diagnosis not present

## 2018-04-01 ENCOUNTER — Other Ambulatory Visit: Payer: Self-pay | Admitting: Endocrinology

## 2018-04-06 ENCOUNTER — Other Ambulatory Visit: Payer: Self-pay | Admitting: Endocrinology

## 2018-04-08 ENCOUNTER — Other Ambulatory Visit: Payer: Self-pay

## 2018-04-08 ENCOUNTER — Telehealth: Payer: Self-pay | Admitting: Endocrinology

## 2018-04-08 MED ORDER — AMLODIPINE BESYLATE 10 MG PO TABS: 5.0000 mg | ORAL_TABLET | Freq: Every day | ORAL | 3 refills | Status: DC

## 2018-04-08 NOTE — Telephone Encounter (Signed)
Since he appears to be taking 5 mg daily he can continue this dose

## 2018-04-08 NOTE — Telephone Encounter (Signed)
amLODipine (NORVASC) 10 MG tablet  Patient stated that the MG has been changed from 10 to 5 mg by the doctor.and they need a new prescription sent in to the pharmacy.   [facility] # [place], [place]

## 2018-04-08 NOTE — Telephone Encounter (Signed)
New Rx sent to pharmacy

## 2018-04-08 NOTE — Telephone Encounter (Signed)
Nothing mentioned in last AVS about decreasing dose from 71mh to 5mg ; Only mentioned about stopping it until BP starts coming back. Did you want to decrease to 5mg ?

## 2018-04-15 ENCOUNTER — Other Ambulatory Visit: Payer: Self-pay

## 2018-04-15 ENCOUNTER — Telehealth: Payer: Self-pay | Admitting: Endocrinology

## 2018-04-15 DIAGNOSIS — E113211 Type 2 diabetes mellitus with mild nonproliferative diabetic retinopathy with macular edema, right eye: Secondary | ICD-10-CM | POA: Diagnosis not present

## 2018-04-15 DIAGNOSIS — H35371 Puckering of macula, right eye: Secondary | ICD-10-CM | POA: Diagnosis not present

## 2018-04-15 DIAGNOSIS — H31092 Other chorioretinal scars, left eye: Secondary | ICD-10-CM | POA: Diagnosis not present

## 2018-04-15 DIAGNOSIS — E113292 Type 2 diabetes mellitus with mild nonproliferative diabetic retinopathy without macular edema, left eye: Secondary | ICD-10-CM | POA: Diagnosis not present

## 2018-04-15 MED ORDER — AMLODIPINE BESYLATE 10 MG PO TABS: 5.0000 mg | ORAL_TABLET | Freq: Every day | ORAL | 3 refills | Status: DC

## 2018-04-15 NOTE — Telephone Encounter (Signed)
Medication sent to pharmacy per pt request.

## 2018-04-15 NOTE — Telephone Encounter (Signed)
Need refill for amLODipine (NORVASC) 5 MG tablet [[id number]] 90 day supply  [facility] # [place], [place]

## 2018-04-17 ENCOUNTER — Other Ambulatory Visit: Payer: Self-pay

## 2018-04-17 ENCOUNTER — Telehealth: Payer: Self-pay | Admitting: Endocrinology

## 2018-04-17 MED ORDER — AMLODIPINE BESYLATE 10 MG PO TABS: 5.0000 mg | ORAL_TABLET | Freq: Every day | ORAL | 3 refills | Status: DC

## 2018-04-17 NOTE — Telephone Encounter (Signed)
5 mg tablets sent to pharmacy.

## 2018-04-17 NOTE — Telephone Encounter (Signed)
Patient stated that she is having trouble cutting the (amlodipine 10 mg) pill in half, so could Kumar prescribe a 5 mg pill, please advise   [facility] # [place], [place]

## 2018-04-24 ENCOUNTER — Other Ambulatory Visit (INDEPENDENT_AMBULATORY_CARE_PROVIDER_SITE_OTHER): Payer: Medicare HMO

## 2018-04-24 DIAGNOSIS — E118 Type 2 diabetes mellitus with unspecified complications: Secondary | ICD-10-CM | POA: Diagnosis not present

## 2018-04-24 LAB — COMPREHENSIVE METABOLIC PANEL
ALT: 28 U/L (ref 0–53)
AST: 26 U/L (ref 0–37)
Albumin: 4.2 g/dL (ref 3.5–5.2)
Alkaline Phosphatase: 44 U/L (ref 39–117)
BUN: 22 mg/dL (ref 6–23)
CO2: 30 meq/L (ref 19–32)
CREATININE: 1.36 mg/dL (ref 0.40–1.50)
Calcium: 9.6 mg/dL (ref 8.4–10.5)
Chloride: 103 mEq/L (ref 96–112)
GFR: 54.38 mL/min — ABNORMAL LOW (ref >60.00)
Glucose, Bld: 258 mg/dL — ABNORMAL HIGH (ref 70–99)
Potassium: 4.2 mEq/L (ref 3.5–5.1)
SODIUM: 139 meq/L (ref 135–145)
Total Bilirubin: 0.5 mg/dL (ref 0.2–1.2)
Total Protein: 6.7 g/dL (ref 6.0–8.3)

## 2018-04-24 LAB — MICROALBUMIN / CREATININE URINE RATIO
CREATININE, U: 64.1 mg/dL
MICROALB UR: 1 mg/dL (ref 0.0–1.9)
MICROALB/CREAT RATIO: 1.5 mg/g (ref 0.0–30.0)

## 2018-04-24 LAB — LIPID PANEL
Cholesterol: 160 mg/dL (ref 0–200)
HDL: 47.8 mg/dL (ref >39.00)
LDL Cholesterol: 94 mg/dL (ref 0–99)
NonHDL: 112.53
Total CHOL/HDL Ratio: 3
Triglycerides: 94 mg/dL (ref 0.0–149.0)
VLDL: 18.8 mg/dL (ref 0.0–40.0)

## 2018-04-24 LAB — HEMOGLOBIN A1C: Hgb A1c MFr Bld: 7.5 % — ABNORMAL HIGH (ref 4.6–6.5)

## 2018-04-27 ENCOUNTER — Inpatient Hospital Stay: Payer: Medicare HMO | Admitting: Internal Medicine

## 2018-04-27 ENCOUNTER — Inpatient Hospital Stay: Payer: Medicare HMO | Attending: Internal Medicine

## 2018-04-27 ENCOUNTER — Encounter: Payer: Self-pay | Admitting: Internal Medicine

## 2018-04-27 ENCOUNTER — Telehealth: Payer: Self-pay | Admitting: Internal Medicine

## 2018-04-27 VITALS — BP 157/74 | HR 58 | Temp 98.3°F | Resp 18 | Ht 67.0 in | Wt 161.2 lb

## 2018-04-27 DIAGNOSIS — C9110 Chronic lymphocytic leukemia of B-cell type not having achieved remission: Secondary | ICD-10-CM

## 2018-04-27 LAB — CBC WITH DIFFERENTIAL/PLATELET
Basophils Absolute: 0.2 10*3/uL — ABNORMAL HIGH (ref 0.0–0.1)
Basophils Relative: 0 %
Eosinophils Absolute: 0.3 10*3/uL (ref 0.0–0.5)
Eosinophils Relative: 0 %
HEMATOCRIT: 41.1 % (ref 38.4–49.9)
Hemoglobin: 13.3 g/dL (ref 13.0–17.1)
LYMPHS PCT: 93 %
Lymphs Abs: 67.6 10*3/uL — ABNORMAL HIGH (ref 0.9–3.3)
MCH: 31.3 pg (ref 27.2–33.4)
MCHC: 32.4 g/dL (ref 32.0–36.0)
MCV: 96.7 fL (ref 79.3–98.0)
Monocytes Absolute: 1.5 10*3/uL — ABNORMAL HIGH (ref 0.1–0.9)
Monocytes Relative: 2 %
Neutro Abs: 3.7 10*3/uL (ref 1.5–6.5)
Neutrophils Relative %: 5 %
PLATELETS: 213 10*3/uL (ref 140–400)
RBC: 4.25 MIL/uL (ref 4.20–5.82)
RDW: 14.7 % — AB (ref 11.0–14.6)
WBC: 73.4 10*3/uL (ref 4.0–10.3)

## 2018-04-27 LAB — COMPREHENSIVE METABOLIC PANEL
ALBUMIN: 4 g/dL (ref 3.5–5.0)
ALT: 39 U/L (ref 0–55)
AST: 37 U/L — AB (ref 5–34)
Alkaline Phosphatase: 50 U/L (ref 40–150)
Anion gap: 8 (ref 3–11)
BILIRUBIN TOTAL: 0.4 mg/dL (ref 0.2–1.2)
BUN: 23 mg/dL (ref 7–26)
CHLORIDE: 106 mmol/L (ref 98–109)
CO2: 24 mmol/L (ref 22–29)
Calcium: 9.4 mg/dL (ref 8.4–10.4)
Creatinine, Ser: 1.42 mg/dL — ABNORMAL HIGH (ref 0.70–1.30)
GFR calc Af Amer: 55 mL/min — ABNORMAL LOW (ref >60)
GFR, EST NON AFRICAN AMERICAN: 47 mL/min — AB (ref >60)
GLUCOSE: 327 mg/dL — AB (ref 70–140)
POTASSIUM: 4.5 mmol/L (ref 3.5–5.1)
Sodium: 138 mmol/L (ref 136–145)
Total Protein: 6.6 g/dL (ref 6.4–8.3)

## 2018-04-27 LAB — LACTATE DEHYDROGENASE: LDH: 263 U/L — AB (ref 125–245)

## 2018-04-27 NOTE — Progress Notes (Signed)
[facility] [phone]   [phone]  OFFICE PROGRESS NOTE  Elayne Snare, MD [address] [place] [address]  DIAGNOSIS: Chronic lymphocytic leukemia diagnosed in October 2014.  PRIOR THERAPY: None  CURRENT THERAPY: Observation.  INTERVAL HISTORY: Tony Marshall 77 y.o. male returns to the clinic today for 6 months follow-up visit accompanied by his wife.  The patient is feeling fine today with no specific complaints.  He denied having any weight loss or night sweats.  He has no nausea, vomiting, diarrhea or constipation.  He has no fever or chills.  He denied having any bleeding or bruises.  He is here today for evaluation with repeat blood work.   MEDICAL HISTORY: Past Medical History:  Diagnosis Date  . CLL (chronic lymphocytic leukemia) ([place])   . Diabetes mellitus without complication ([place])   . GERD (gastroesophageal reflux disease)   . Hyperlipidemia   . Hypertension   . Lymphocytosis   . Sleep apnea     ALLERGIES:  is allergic to glycopyrrolate.  MEDICATIONS:  Current Outpatient Medications  Medication Sig Dispense Refill  . amLODipine (NORVASC) 10 MG tablet Take 0.5 tablets (5 mg total) by mouth daily. 90 tablet 3  . aspirin 81 MG EC tablet Take 81 mg by mouth daily. Swallow whole.    . benazepril (LOTENSIN) 20 MG tablet TAKE ONE TABLET BY MOUTH ONE TIME DAILY  90 tablet 0  . canagliflozin (INVOKANA) 100 MG TABS tablet TAKE 1 TABLET BY MOUTH DAILY BEFORE BREAKFAST 90 tablet 3  . carvedilol (COREG) 25 MG tablet TAKE ONE TABLET BY MOUTH TWICE DAILY  180 tablet 0  . doxazosin (CARDURA) 4 MG tablet TAKE ONE TABLET BY MOUTH AT BEDTIME  90 tablet 0  . DUREZOL 0.05 % EMUL Place 1 drop into the right eye 2 (two) times daily.    Marland Kitchen esomeprazole (NEXIUM) 40 MG capsule Take 40 mg by mouth daily at 12 noon.    Marland Kitchen glucagon (GLUCAGON EMERGENCY) 1 MG injection Inject 1 mg into the vein once as needed. 1 each 5  . HUMALOG  KWIKPEN 100 UNIT/ML KiwkPen INJECT 3-9 UNITS AT BREAKFAST, 3-4 UNITS AT LUNCH AND 6-9 UNITS AT DINNER 45 mL 0  . insulin degludec (TRESIBA FLEXTOUCH) 100 UNIT/ML SOPN FlexTouch Pen Give 12 units daily. If fasting blood sugars are over 140 increase dosage up to 15 units daily as directed. 15 mL 3  . ONE TOUCH ULTRA TEST test strip USE ONE STRIP TO CHECK GLUCOSE THREE TIMES DAILY 100 each 3  . ONETOUCH VERIO test strip USE 1 STRIP TO CHECK GLUCOSE THREE TIMES DAILY 100 each 3  . pioglitazone (ACTOS) 15 MG tablet Take 1 tablet (15 mg total) by mouth daily. 90 tablet 0  . pioglitazone (ACTOS) 15 MG tablet take 1 tablet by mouth once daily 30 tablet 0  . rosuvastatin (CRESTOR) 5 MG tablet Take 1 tablet (5 mg total) by mouth every morning. 90 tablet 3  . saccharomyces boulardii (FLORASTOR) 250 MG capsule Take 250 mg by mouth 2 (two) times daily.    . sildenafil (REVATIO) 20 MG tablet TAKE 5 TABLETS BY MOUTH AS NEEDED once a week. 30 tablet 2  . zolpidem (AMBIEN CR) 6.25 MG CR tablet Take 1 tablet (6.25 mg total) by mouth at bedtime as needed. for sleep 30 tablet 0   No current facility-administered medications for this visit.     SURGICAL HISTORY:  Past Surgical  History:  Procedure Laterality Date  . BLEPHAROPLASTY    . COLONOSCOPY  2012  . HEMORRHOID SURGERY     Sclerotherapy  . KNEE SURGERY Right 2002  . POLYPECTOMY    . SHOULDER SURGERY Right 2007    REVIEW OF SYSTEMS:  A comprehensive review of systems was negative.   PHYSICAL EXAMINATION: General appearance: alert, cooperative and no distress Head: Normocephalic, without obvious abnormality, atraumatic Neck: no adenopathy, no JVD, supple, symmetrical, trachea midline and thyroid not enlarged, symmetric, no tenderness/mass/nodules Lymph nodes: Cervical, supraclavicular, and axillary nodes normal. Resp: clear to auscultation bilaterally Back: symmetric, no curvature. ROM normal. No CVA tenderness. Cardio: regular rate and rhythm, S1,  S2 normal, no murmur, click, rub or gallop GI: soft, non-tender; bowel sounds normal; no masses,  no organomegaly Extremities: extremities normal, atraumatic, no cyanosis or edema  ECOG PERFORMANCE STATUS: 1 - Symptomatic but completely ambulatory  Blood pressure (!) 157/74, pulse (!) 58, temperature 98.3 F (36.8 C), temperature source Oral, resp. rate 18, height 5\' 7"  (1.702 m), weight 161 lb 3.2 oz (73.1 kg), SpO2 100 %.  LABORATORY DATA: Lab Results  Component Value Date   WBC 73.4 (HH) 04/27/2018   HGB 13.3 04/27/2018   HCT 41.1 04/27/2018   MCV 96.7 04/27/2018   PLT 213 04/27/2018      Chemistry      Component Value Date/Time   NA 139 04/24/2018 1455   NA 141 08/06/2017 1306   K 4.2 04/24/2018 1455   K 4.3 08/06/2017 1306   CL 103 04/24/2018 1455   CO2 30 04/24/2018 1455   CO2 25 08/06/2017 1306   BUN 22 04/24/2018 1455   BUN 22.9 08/06/2017 1306   CREATININE 1.36 04/24/2018 1455   CREATININE 1.3 08/06/2017 1306      Component Value Date/Time   CALCIUM 9.6 04/24/2018 1455   CALCIUM 9.3 08/06/2017 1306   ALKPHOS 44 04/24/2018 1455   ALKPHOS 47 08/06/2017 1306   AST 26 04/24/2018 1455   AST 20 08/06/2017 1306   ALT 28 04/24/2018 1455   ALT 19 08/06/2017 1306   BILITOT 0.5 04/24/2018 1455   BILITOT 0.46 08/06/2017 1306       RADIOGRAPHIC STUDIES: No results found.  ASSESSMENT AND PLAN:  This is a very pleasant 77 years old white male with chronic lymphocytic leukemia currently on observation. He continues to do well with no concerning complaints. CBC today showed stable white blood count 73,400.  I discussed the lab results with the patient today.  I recommended for him to continue on observation with repeat CBC, conference metabolic panel and LDH in 6 months. The patient voices understanding of current disease status and treatment options and is in agreement with the current care plan. All questions were answered. The patient knows to call the clinic with  any problems, questions or concerns. We can certainly see the patient much sooner if necessary. I spent 10 minutes counseling the patient face to face. The total time spent in the appointment was 15 minutes.  Disclaimer: This note was dictated with voice recognition software. Similar sounding words can inadvertently be transcribed and may not be corrected upon review.

## 2018-04-27 NOTE — Telephone Encounter (Signed)
Scheduled appt per 5/13 los - Gave patient aVS and calender per los. Lab and f./u in 6 months.

## 2018-04-28 ENCOUNTER — Ambulatory Visit: Payer: Medicare HMO | Admitting: Endocrinology

## 2018-04-28 ENCOUNTER — Encounter: Payer: Self-pay | Admitting: Endocrinology

## 2018-04-28 VITALS — BP 130/72 | HR 56 | Ht 67.0 in | Wt 162.4 lb

## 2018-04-28 DIAGNOSIS — I10 Essential (primary) hypertension: Secondary | ICD-10-CM | POA: Diagnosis not present

## 2018-04-28 DIAGNOSIS — E1165 Type 2 diabetes mellitus with hyperglycemia: Secondary | ICD-10-CM

## 2018-04-28 DIAGNOSIS — E7800 Pure hypercholesterolemia, unspecified: Secondary | ICD-10-CM

## 2018-04-28 DIAGNOSIS — Z794 Long term (current) use of insulin: Secondary | ICD-10-CM | POA: Diagnosis not present

## 2018-04-28 NOTE — Patient Instructions (Addendum)
TRESIBA 13 UNITS IN AM AND NOT CHANGE UNLESS CONSISTENTLY <90 or > 140  Novolog upto 9 at supper  More sugars after supper and lunch

## 2018-04-28 NOTE — Progress Notes (Signed)
Patient ID: Tony Marshall, male   DOB: 06-29-1941, 77 y.o.   MRN: [id number]   Reason for Appointment:  follow-up of various conditions  History of Present Illness     Type 2 DIABETES MELITUS, date of diagnosis:  1992        His blood sugar has been  difficult to control over the last few years because of variability in his blood sugars On basal bolus insulin for a few years and his oral hypoglycemic drugs have been continued Also did not tolerate GLP-1 drugs or symlin because of nausea He is essentially looking like a a type I patient with requiring relatively low amounts of insulin and twice a day dosage of basal insulin  He has also had overall much better controlled with adding Invokana in 07/2016  Recent history:  Insulin regimen: TRESIBA insulin 7 units twice a day.  Novolog  insulin: Breakfast = 2-4 units, lunch 0-4  supper 5-6 units based on meal size   His A1c is slightly better at 7.5   Recent blood sugar patterns and problems:  He says he did stop taking Invokana over a month ago because he was losing too much weight and then went back on it a month ago again  He was told to take his TRESIBA once a day in the morning but he forgot and is still taking it twice a day  He tries to keep his bedtime readings high because he is afraid of getting low during the night and probably does not take enough coverage for his evening meal  Also he may be eating larger portions at suppertime at times causing some variability in blood sugar  POSTPRANDIAL readings again are highest at bedtime and recently mostly high with occasional good readings also  FASTING blood sugars are generally fairly good but has had a couple of readings in the 60s and about 3 readings over 150 also, does not know how to explain the variability  Has somewhat more increased activity during the day with yard work but this does not cause low sugars  His dinner is the largest meal  Although he  thinks he does not eat much at lunchtime he does have a few readings are high in the afternoon including in the lab, may be getting more snacks  He is eating less fried food but sometimes goes overboard with carbohydrates  Not doing any formal exercise  HYPOGLYCEMIA has occurred only with readings in the 60s as above in the morning   Hypoglycemia:  as above  Oral hypoglycemic drugs: Actos, Invokana          Side effects from medications: Diarrhea from Metformin          Monitors blood glucose: up to twice daily    Glucometer: One Touch.          Blood Glucose readings from one touch zoom software download:  Mean values apply above for all meters except median for One Touch  PRE-MEAL Fasting Lunch Dinner Bedtime Overall  Glucose range:  60-217   339    Mean/median: 115  131    128+/-66   POST-MEAL PC Breakfast PC Lunch PC Dinner  Glucose range:   102, 236  84-255  Mean/median:    170     PREVIOUS readings  Mean values apply above for all meters except median for One Touch  PRE-MEAL Fasting Lunch Dinner Bedtime Overall  Glucose range: 56-159  Mean/median: 115    148   POST-MEAL PC Breakfast PC Lunch PC Dinner  Glucose range:  1 74-258 123-308  Mean/median:  203 179    Meals: 2 meals per day. Snack usually for lunch unless going out to eat     Has Bfst at 10-11 am dinner at 7-8 PM     Physical activity: exercise:  yardwork    Wt Readings from Last 3 Encounters:  04/28/18 162 lb 6.4 oz (73.7 kg)  04/27/18 161 lb 3.2 oz (73.1 kg)  01/14/18 157 lb 12.8 oz (71.6 kg)           LABS:  Lab Results  Component Value Date   HGBA1C 7.5 (H) 04/24/2018   HGBA1C 7.6 (H) 01/09/2018   HGBA1C 7.3 (H) 10/13/2017   Lab Results  Component Value Date   MICROALBUR 1.0 04/24/2018   LDLCALC 94 04/24/2018   CREATININE 1.42 (H) 04/27/2018       Appointment on 04/27/2018  Component Date Value Ref Range Status  . WBC 04/27/2018 73.4* 4.0 - 10.3 K/uL Final   CRITICAL  RESULT CALLED TO, READ BACK BY AND VERIFIED WITH: Abelina Bachelor   . RBC 04/27/2018 4.25  4.20 - 5.82 MIL/uL Final  . Hemoglobin 04/27/2018 13.3  13.0 - 17.1 g/dL Final  . HCT 04/27/2018 41.1  38.4 - 49.9 % Final  . MCV 04/27/2018 96.7  79.3 - 98.0 fL Final  . MCH 04/27/2018 31.3  27.2 - 33.4 pg Final  . MCHC 04/27/2018 32.4  32.0 - 36.0 g/dL Final  . RDW 04/27/2018 14.7* 11.0 - 14.6 % Final  . Platelets 04/27/2018 213  140 - 400 K/uL Final  . Smear Review 04/27/2018 Variant lymphocytes and smudge cells seen   Final  . Neutrophils Relative % 04/27/2018 5  % Final  . Neutro Abs 04/27/2018 3.7  1.5 - 6.5 K/uL Final  . Lymphocytes Relative 04/27/2018 93  % Final  . Lymphs Abs 04/27/2018 67.6* 0.9 - 3.3 K/uL Final  . Monocytes Relative 04/27/2018 2  % Final  . Monocytes Absolute 04/27/2018 1.5* 0.1 - 0.9 K/uL Final  . Eosinophils Relative 04/27/2018 0  % Final  . Eosinophils Absolute 04/27/2018 0.3  0.0 - 0.5 K/uL Final  . Basophils Relative 04/27/2018 0  % Final  . Basophils Absolute 04/27/2018 0.2* 0.0 - 0.1 K/uL Final   Performed at [facility], [place] [address], [place], [place] [address]  . Sodium 04/27/2018 138  136 - 145 mmol/L Final  . Potassium 04/27/2018 4.5  3.5 - 5.1 mmol/L Final  . Chloride 04/27/2018 106  98 - 109 mmol/L Final  . CO2 04/27/2018 24  22 - 29 mmol/L Final  . Glucose, Bld 04/27/2018 327* 70 - 140 mg/dL Final  . BUN 04/27/2018 23  7 - 26 mg/dL Final  . Creatinine, Ser 04/27/2018 1.42* 0.70 - 1.30 mg/dL Final  . Calcium 04/27/2018 9.4  8.4 - 10.4 mg/dL Final  . Total Protein 04/27/2018 6.6  6.4 - 8.3 g/dL Final  . Albumin 04/27/2018 4.0  3.5 - 5.0 g/dL Final  . AST 04/27/2018 37* 5 - 34 U/L Final  . ALT 04/27/2018 39  0 - 55 U/L Final  . Alkaline Phosphatase 04/27/2018 50  40 - 150 U/L Final  . Total Bilirubin 04/27/2018 0.4  0.2 - 1.2 mg/dL Final  . GFR calc non Af Amer 04/27/2018 47* >60 mL/min Final  . GFR calc Af Amer 04/27/2018 55* >60  mL/min  Final   Comment: (NOTE) The eGFR has been calculated using the CKD EPI equation. This calculation has not been validated in all clinical situations. eGFR's persistently <60 mL/min signify possible Chronic Kidney Disease.   Georgiann Hahn gap 04/27/2018 8  3 - 11 Final   Performed at [facility], [place] [address], [place], [place] [address]  . LDH 04/27/2018 263* 125 - 245 U/L Final   Performed at [facility], [place] [address] [place], [place] [address]  Lab on 04/24/2018  Component Date Value Ref Range Status  . Cholesterol 04/24/2018 160  0 - 200 mg/dL Final   ATP III Classification       Desirable:  < 200 mg/dL               Borderline High:  200 - 239 mg/dL          High:  > = 240 mg/dL  . Triglycerides 04/24/2018 94.0  0.0 - 149.0 mg/dL Final   Normal:  <150 mg/dLBorderline High:  150 - 199 mg/dL  . HDL 04/24/2018 47.80  >39.00 mg/dL Final  . VLDL 04/24/2018 18.8  0.0 - 40.0 mg/dL Final  . LDL Cholesterol 04/24/2018 94  0 - 99 mg/dL Final  . Total CHOL/HDL Ratio 04/24/2018 3   Final                  Men          Women1/2 Average Risk     3.4          3.3Average Risk          5.0          4.42X Average Risk          9.6          7.13X Average Risk          15.0          11.0                      . NonHDL 04/24/2018 112.53   Final   NOTE:  Non-HDL goal should be 30 mg/dL higher than patient's LDL goal (i.e. LDL goal of < 70 mg/dL, would have non-HDL goal of < 100 mg/dL)  . Microalb, Ur 04/24/2018 1.0  0.0 - 1.9 mg/dL Final  . Creatinine,U 04/24/2018 64.1  mg/dL Final  . Microalb Creat Ratio 04/24/2018 1.5  0.0 - 30.0 mg/g Final  . Sodium 04/24/2018 139  135 - 145 mEq/L Final  . Potassium 04/24/2018 4.2  3.5 - 5.1 mEq/L Final  . Chloride 04/24/2018 103  96 - 112 mEq/L Final  . CO2 04/24/2018 30  19 - 32 mEq/L Final  . Glucose, Bld 04/24/2018 258* 70 - 99 mg/dL Final  . BUN 04/24/2018 22  6 - 23 mg/dL Final  . Creatinine, Ser  04/24/2018 1.36  0.40 - 1.50 mg/dL Final  . Total Bilirubin 04/24/2018 0.5  0.2 - 1.2 mg/dL Final  . Alkaline Phosphatase 04/24/2018 44  39 - 117 U/L Final  . AST 04/24/2018 26  0 - 37 U/L Final  . ALT 04/24/2018 28  0 - 53 U/L Final  . Total Protein 04/24/2018 6.7  6.0 - 8.3 g/dL Final  . Albumin 04/24/2018 4.2  3.5 - 5.2 g/dL Final  . Calcium 04/24/2018 9.6  8.4 - 10.5 mg/dL Final  . GFR 04/24/2018 54.38* >60.00 mL/min Final  . Hgb A1c MFr  Bld 04/24/2018 7.5* 4.6 - 6.5 % Final   Glycemic Control Guidelines for People with Diabetes:Non Diabetic:  <6%Goal of Therapy: <7%Additional Action Suggested:  >8%     Allergies as of 04/28/2018      Reactions   Glycopyrrolate Rash      Medication List        Accurate as of 04/28/18  3:09 PM. Always use your most recent med list.          amLODipine 10 MG tablet Commonly known as:  NORVASC Take 0.5 tablets (5 mg total) by mouth daily.   aspirin 81 MG EC tablet Take 81 mg by mouth daily. Swallow whole.   benazepril 20 MG tablet Commonly known as:  LOTENSIN TAKE ONE TABLET BY MOUTH ONE TIME DAILY   canagliflozin 100 MG Tabs tablet Commonly known as:  INVOKANA TAKE 1 TABLET BY MOUTH DAILY BEFORE BREAKFAST   carvedilol 25 MG tablet Commonly known as:  COREG TAKE ONE TABLET BY MOUTH TWICE DAILY   doxazosin 4 MG tablet Commonly known as:  CARDURA TAKE ONE TABLET BY MOUTH AT BEDTIME   DUREZOL 0.05 % Emul Generic drug:  Difluprednate Place 1 drop into the right eye 2 (two) times daily.   esomeprazole 40 MG capsule Commonly known as:  NEXIUM Take 40 mg by mouth daily at 12 noon.   FLORASTOR 250 MG capsule Generic drug:  saccharomyces boulardii Take 250 mg by mouth 2 (two) times daily.   glucagon 1 MG injection Commonly known as:  GLUCAGON EMERGENCY Inject 1 mg into the vein once as needed.   HUMALOG KWIKPEN 100 UNIT/ML KiwkPen Generic drug:  insulin lispro INJECT 3-9 UNITS AT BREAKFAST, 3-4 UNITS AT LUNCH AND 6-9 UNITS AT  DINNER   insulin degludec 100 UNIT/ML Sopn FlexTouch Pen Commonly known as:  TRESIBA FLEXTOUCH Give 12 units daily. If fasting blood sugars are over 140 increase dosage up to 15 units daily as directed.   ONE TOUCH ULTRA TEST test strip Generic drug:  glucose blood USE ONE STRIP TO CHECK GLUCOSE THREE TIMES DAILY   ONETOUCH VERIO test strip Generic drug:  glucose blood USE 1 STRIP TO CHECK GLUCOSE THREE TIMES DAILY   pioglitazone 15 MG tablet Commonly known as:  ACTOS Take 1 tablet (15 mg total) by mouth daily.   pioglitazone 15 MG tablet Commonly known as:  ACTOS take 1 tablet by mouth once daily   rosuvastatin 5 MG tablet Commonly known as:  CRESTOR Take 1 tablet (5 mg total) by mouth every morning.   sildenafil 20 MG tablet Commonly known as:  REVATIO TAKE 5 TABLETS BY MOUTH AS NEEDED once a week.   zolpidem 6.25 MG CR tablet Commonly known as:  AMBIEN CR Take 1 tablet (6.25 mg total) by mouth at bedtime as needed. for sleep       Allergies:  Allergies  Allergen Reactions  . Glycopyrrolate Rash    Past Medical History:  Diagnosis Date  . CLL (chronic lymphocytic leukemia) ([place])   . Diabetes mellitus without complication ([place])   . GERD (gastroesophageal reflux disease)   . Hyperlipidemia   . Hypertension   . Lymphocytosis   . Sleep apnea     Past Surgical History:  Procedure Laterality Date  . BLEPHAROPLASTY    . COLONOSCOPY  2012  . HEMORRHOID SURGERY     Sclerotherapy  . KNEE SURGERY Right 2002  . POLYPECTOMY    . SHOULDER SURGERY Right 2007    Family History  Problem Relation Age of Onset  . Kidney disease Mother   . CAD Maternal Aunt   . Colon cancer Neg Hx   . Esophageal cancer Neg Hx   . Rectal cancer Neg Hx   . Stomach cancer Neg Hx     Social History:  reports that he has never smoked. He quit smokeless tobacco use about 5 years ago. His smokeless tobacco use included chew. He reports that he drinks about 3.6 oz of alcohol per week.  He reports that he does not use drugs.      ROS   HYPERLIPIDEMIA: The lipid abnormality consists of elevated LDL which is very well controlled with 55m Crestor   Lab Results  Component Value Date   CHOL 160 04/24/2018   HDL 47.80 04/24/2018   LDLCALC 94 04/24/2018   TRIG 94.0 04/24/2018   CHOLHDL 3 04/24/2018    Sleep apnea,  treated with CPAP  INSOMNIA: This is chronic and he has been taking Ambien .   Because of late insomnia was switched from regular Ambien to the Ambien CR He Is currently taking 6.25 mg but occasionally has early insomnia  HYPERTENSION: He is on multiple drugs for his long-standing hypertension He was previously evaluated in 1/08 with urine metanephrines and renal artery ultrasound.   He says that his blood pressure today at home was 160, does not know what brand of meter he is using     BP Readings from Last 3 Encounters:  04/28/18 130/72  04/27/18 (!) 157/74  01/14/18 (!) 96/55       Examination:   BP 130/72 (BP Location: Left Arm, Patient Position: Sitting, Cuff Size: Normal)   Pulse (!) 56   Ht 5' 7"  (1.702 m)   Wt 162 lb 6.4 oz (73.7 kg)   SpO2 97%   BMI 25.44 kg/m     ASSESSMENT/ PLAN:    . DIABETES type 2, Nonobese  See history of present illness for detailed discussion of his current management, blood sugar patterns and problems identified  His blood sugars are still erratic and mostly high later at night  As discussed above he is afraid of hypoglycemia overnight and is usually taking inadequate insulin at suppertime to cover his meals along with eating large portions and carbohydrates Generally fasting readings are near normal with occasional low normal or low readings but recently higher He has been advised to take [place] but he is still doing this twice a day and may sometimes increase the dose at night if blood sugars are relatively high and does not understand how this works He does have somewhat inadequate  monitoring after breakfast Also not clear why sometimes blood sugars are significantly high in the afternoon even though he thinks he does not eat lunch His wife is also present and diet in day-to-day management of insulin was discussed in detail   Recommended the following today  He needs to increase his mealtime doses for larger meals by 2-3 units at night  If he is eating a significant snack or lunch he will need to take more insulin  He needs to increase his frequency of monitoring after meals overall especially breakfast in the afternoon  Discussed not needing to adjust his TTyler Aasbased on blood sugars at night and for convenience and better assessment of his daily requirement he will switch to once a day and take 13 units in the morning to start with  He will not adjust the dose unless fasting  readings are consistently out of the 90-130 range in the morning  Continue Invokana consistently  Prescription for Invokana given him to get from [place]  Schedule consultation with dietitian, he will go with his wife   HYPERTENSION: Blood pressure is fairly good today and since he may have an inaccurate reading on his home monitoring is to bring it in for comparison  LIPIDS: Well controlled, to continue Crestor  Chronic leukemia: Reviewed his consultants notes from yesterday and reassured him that since he is asymptomatic at this time he will be continued to be followed expectantly  Counseling time on subjects discussed in assessment and plan sections is over 50% of today's 25 minute visit   Elayne Snare 04/28/2018, 3:09 PM

## 2018-04-29 ENCOUNTER — Other Ambulatory Visit: Payer: Self-pay | Admitting: Endocrinology

## 2018-05-05 ENCOUNTER — Other Ambulatory Visit: Payer: Medicare HMO

## 2018-05-05 ENCOUNTER — Ambulatory Visit: Payer: Medicare HMO | Admitting: Internal Medicine

## 2018-05-21 DIAGNOSIS — R69 Illness, unspecified: Secondary | ICD-10-CM | POA: Diagnosis not present

## 2018-05-28 ENCOUNTER — Ambulatory Visit: Payer: Medicare HMO | Admitting: Dietician

## 2018-06-26 DIAGNOSIS — R69 Illness, unspecified: Secondary | ICD-10-CM | POA: Diagnosis not present

## 2018-07-06 ENCOUNTER — Telehealth: Payer: Self-pay | Admitting: Emergency Medicine

## 2018-07-06 ENCOUNTER — Other Ambulatory Visit: Payer: Self-pay

## 2018-07-06 MED ORDER — BENAZEPRIL HCL 20 MG PO TABS: 20.0000 mg | ORAL_TABLET | Freq: Every day | ORAL | 0 refills | Status: DC

## 2018-07-06 MED ORDER — PIOGLITAZONE HCL 15 MG PO TABS: 15.0000 mg | ORAL_TABLET | Freq: Every day | ORAL | 0 refills | Status: DC

## 2018-07-06 NOTE — Telephone Encounter (Signed)
Pt called and needs a 90- day refill on his benazepril (LOTENSIN) 20 MG tablet and pioglitazone (ACTOS) 15 MG tablet. Pharmacy is [facility]. Thanks.

## 2018-07-06 NOTE — Telephone Encounter (Signed)
Rx has been sent for both medications.

## 2018-07-08 ENCOUNTER — Encounter: Payer: Self-pay | Admitting: Endocrinology

## 2018-07-08 ENCOUNTER — Ambulatory Visit: Payer: Medicare HMO | Admitting: Endocrinology

## 2018-07-08 ENCOUNTER — Telehealth: Payer: Self-pay | Admitting: Endocrinology

## 2018-07-08 VITALS — BP 130/72 | HR 76 | Temp 98.6°F | Ht 67.0 in | Wt 160.0 lb

## 2018-07-08 DIAGNOSIS — N390 Urinary tract infection, site not specified: Secondary | ICD-10-CM

## 2018-07-08 LAB — POCT URINALYSIS DIPSTICK
Bilirubin, UA: NEGATIVE
Glucose, UA: POSITIVE — AB
Ketones, UA: POSITIVE
PH UA: 6 (ref 5.0–8.0)
Protein, UA: POSITIVE — AB
Spec Grav, UA: 1.01 (ref 1.010–1.025)
UROBILINOGEN UA: 0.2 U/dL

## 2018-07-08 MED ORDER — CIPROFLOXACIN HCL 500 MG PO TABS: 500.0000 mg | ORAL_TABLET | Freq: Two times a day (BID) | ORAL | 0 refills | Status: DC

## 2018-07-08 NOTE — Telephone Encounter (Signed)
Patient's wife Enid Derry called re: patient is having the following symptoms for about 3 days and is getting a little worse: Painful urination-with strong odor/lower back and stomach pain. I was unable to find an appointment for this week. Please call Enid Derry or patient at ph# [phone] to advise

## 2018-07-08 NOTE — Addendum Note (Signed)
Addended by: Kaylyn Lim I on: 07/08/2018 04:22 PM   Modules accepted: Orders

## 2018-07-08 NOTE — Telephone Encounter (Signed)
Do you want a spot opened up? It sounds like UTI symptoms.

## 2018-07-08 NOTE — Telephone Encounter (Signed)
Patient can come in now

## 2018-07-08 NOTE — Telephone Encounter (Signed)
Called pt's wife and informed her that Dr. Dwyane Dee would see her husband now, and she stated that she would be here in about 35-40 minutes.

## 2018-07-08 NOTE — Progress Notes (Signed)
Patient ID: Tony Marshall, male   DOB: Sep 14, 1941, 77 y.o.   MRN: [id number]          Chief complaint: Painful urination  History of Present Illness:  Since Sunday night he has had very frequent urination about every hour at night This is also uncomfortable and causing some burning He is also having sweating at night but no chills or fever He thinks his lower back is hurting also but not in the flank His urine color is not unusual but slightly darker He is still having discomfort with urination today   Allergies as of 07/08/2018      Reactions   Glycopyrrolate Rash      Medication List        Accurate as of 07/08/18  3:43 PM. Always use your most recent med list.          amLODipine 10 MG tablet Commonly known as:  NORVASC Take 0.5 tablets (5 mg total) by mouth daily.   aspirin 81 MG EC tablet Take 81 mg by mouth daily. Swallow whole.   benazepril 20 MG tablet Commonly known as:  LOTENSIN Take 1 tablet (20 mg total) by mouth daily.   canagliflozin 100 MG Tabs tablet Commonly known as:  INVOKANA TAKE 1 TABLET BY MOUTH DAILY BEFORE BREAKFAST   carvedilol 25 MG tablet Commonly known as:  COREG TAKE ONE TABLET BY MOUTH TWICE DAILY   ciprofloxacin 500 MG tablet Commonly known as:  CIPRO Take 1 tablet (500 mg total) by mouth 2 (two) times daily.   doxazosin 4 MG tablet Commonly known as:  CARDURA TAKE ONE TABLET BY MOUTH AT BEDTIME   DUREZOL 0.05 % Emul Generic drug:  Difluprednate Place 1 drop into the right eye 2 (two) times daily.   esomeprazole 40 MG capsule Commonly known as:  NEXIUM Take 40 mg by mouth daily at 12 noon.   FLORASTOR 250 MG capsule Generic drug:  saccharomyces boulardii Take 250 mg by mouth 2 (two) times daily.   glucagon 1 MG injection Commonly known as:  GLUCAGON EMERGENCY Inject 1 mg into the vein once as needed.   HUMALOG KWIKPEN 100 UNIT/ML KiwkPen Generic drug:  insulin lispro INJECT 3-9 UNITS AT BREAKFAST, 3-4 UNITS AT  LUNCH AND 6-9 UNITS AT DINNER   insulin degludec 100 UNIT/ML Sopn FlexTouch Pen Commonly known as:  TRESIBA FLEXTOUCH Give 12 units daily. If fasting blood sugars are over 140 increase dosage up to 15 units daily as directed.   ONE TOUCH ULTRA TEST test strip Generic drug:  glucose blood USE ONE STRIP TO CHECK GLUCOSE THREE TIMES DAILY   ONETOUCH VERIO test strip Generic drug:  glucose blood USE 1 STRIP TO CHECK GLUCOSE THREE TIMES DAILY   pioglitazone 15 MG tablet Commonly known as:  ACTOS take 1 tablet by mouth once daily   rosuvastatin 5 MG tablet Commonly known as:  CRESTOR Take 1 tablet (5 mg total) by mouth every morning.   sildenafil 20 MG tablet Commonly known as:  REVATIO TAKE 5 TABLETS BY MOUTH AS NEEDED once a week.   zolpidem 6.25 MG CR tablet Commonly known as:  AMBIEN CR Take 1 tablet (6.25 mg total) by mouth at bedtime as needed. for sleep       Allergies:  Allergies  Allergen Reactions  . Glycopyrrolate Rash    Past Medical History:  Diagnosis Date  . CLL (chronic lymphocytic leukemia) ([place])   . Diabetes mellitus without complication ([place])   .  GERD (gastroesophageal reflux disease)   . Hyperlipidemia   . Hypertension   . Lymphocytosis   . Sleep apnea     Past Surgical History:  Procedure Laterality Date  . BLEPHAROPLASTY    . COLONOSCOPY  2012  . HEMORRHOID SURGERY     Sclerotherapy  . KNEE SURGERY Right 2002  . POLYPECTOMY    . SHOULDER SURGERY Right 2007    Family History  Problem Relation Age of Onset  . Kidney disease Mother   . CAD Maternal Aunt   . Colon cancer Neg Hx   . Esophageal cancer Neg Hx   . Rectal cancer Neg Hx   . Stomach cancer Neg Hx     Social History:  reports that he has never smoked. He quit smokeless tobacco use about 5 years ago. His smokeless tobacco use included chew. He reports that he drinks about 3.6 oz of alcohol per week. He reports that he does not use drugs.   Office Visit on 07/08/2018    Component Date Value Ref Range Status  . Color, UA 07/08/2018 yellow   Final  . Clarity, UA 07/08/2018 clear   Final  . Glucose, UA 07/08/2018 Positive* Negative Final  . Bilirubin, UA 07/08/2018 negative   Final  . Ketones, UA 07/08/2018 positive   Final  . Spec Grav, UA 07/08/2018 1.010  1.010 - 1.025 Final  . Blood, UA 07/08/2018 large   Final  . pH, UA 07/08/2018 6.0  5.0 - 8.0 Final  . Protein, UA 07/08/2018 Positive* Negative Final  . Urobilinogen, UA 07/08/2018 0.2  0.2 or 1.0 E.U./dL Final  . Nitrite, UA 07/08/2018 small   Final  . Leukocytes, UA 07/08/2018 Moderate (2+)* Negative Final    EXAM:  BP 130/72 (BP Location: Left Arm, Patient Position: Sitting, Cuff Size: Normal)   Pulse 76   Temp 98.6 F (37 C) (Oral)   Ht 5\' 7"  (1.702 m)   Wt 160 lb (72.6 kg)   SpO2 97%   BMI 25.06 kg/m   Physical Exam  Abdominal: There is no tenderness. There is no guarding.  No flank tenderness, slight tenderness in the left lower rib cage Has minimal tenderness of the hypogastric area His tongue is slightly dry  Assessment/Plan:   He probably has cystitis or prostatitis causing acute symptoms and dysuria, only mild systemic symptoms Urine dipstick shows abnormal findings suggestive of UTI and urine culture has been sent He will start Cipro 500 mg twice daily for 5 days, this may need to be adjusted based on his culture Increase water intake recommended   Elayne Snare 07/08/2018, 3:43 PM

## 2018-07-11 LAB — URINE CULTURE

## 2018-07-27 ENCOUNTER — Other Ambulatory Visit (INDEPENDENT_AMBULATORY_CARE_PROVIDER_SITE_OTHER): Payer: Medicare HMO

## 2018-07-27 DIAGNOSIS — Z794 Long term (current) use of insulin: Secondary | ICD-10-CM | POA: Diagnosis not present

## 2018-07-27 DIAGNOSIS — E1165 Type 2 diabetes mellitus with hyperglycemia: Secondary | ICD-10-CM | POA: Diagnosis not present

## 2018-07-27 DIAGNOSIS — R69 Illness, unspecified: Secondary | ICD-10-CM | POA: Diagnosis not present

## 2018-07-27 LAB — COMPREHENSIVE METABOLIC PANEL
ALT: 15 U/L (ref 0–53)
AST: 20 U/L (ref 0–37)
Albumin: 3.8 g/dL (ref 3.5–5.2)
Alkaline Phosphatase: 38 U/L — ABNORMAL LOW (ref 39–117)
BUN: 18 mg/dL (ref 6–23)
CO2: 31 mEq/L (ref 19–32)
CREATININE: 1.18 mg/dL (ref 0.40–1.50)
Calcium: 9.3 mg/dL (ref 8.4–10.5)
Chloride: 106 mEq/L (ref 96–112)
GFR: 64.02 mL/min (ref >60.00)
GLUCOSE: 144 mg/dL — AB (ref 70–99)
POTASSIUM: 4.2 meq/L (ref 3.5–5.1)
SODIUM: 141 meq/L (ref 135–145)
TOTAL PROTEIN: 6.2 g/dL (ref 6.0–8.3)
Total Bilirubin: 0.5 mg/dL (ref 0.2–1.2)

## 2018-07-27 LAB — HEMOGLOBIN A1C: Hgb A1c MFr Bld: 7.7 % — ABNORMAL HIGH (ref 4.6–6.5)

## 2018-07-28 DIAGNOSIS — R69 Illness, unspecified: Secondary | ICD-10-CM | POA: Diagnosis not present

## 2018-07-30 ENCOUNTER — Ambulatory Visit (INDEPENDENT_AMBULATORY_CARE_PROVIDER_SITE_OTHER): Payer: Medicare HMO | Admitting: Endocrinology

## 2018-07-30 ENCOUNTER — Encounter: Payer: Self-pay | Admitting: Endocrinology

## 2018-07-30 VITALS — BP 130/72 | HR 63 | Ht 67.0 in | Wt 158.6 lb

## 2018-07-30 DIAGNOSIS — E1165 Type 2 diabetes mellitus with hyperglycemia: Secondary | ICD-10-CM | POA: Diagnosis not present

## 2018-07-30 DIAGNOSIS — I10 Essential (primary) hypertension: Secondary | ICD-10-CM | POA: Diagnosis not present

## 2018-07-30 DIAGNOSIS — G4700 Insomnia, unspecified: Secondary | ICD-10-CM | POA: Diagnosis not present

## 2018-07-30 DIAGNOSIS — N62 Hypertrophy of breast: Secondary | ICD-10-CM | POA: Diagnosis not present

## 2018-07-30 DIAGNOSIS — Z0001 Encounter for general adult medical examination with abnormal findings: Secondary | ICD-10-CM | POA: Diagnosis not present

## 2018-07-30 DIAGNOSIS — N402 Nodular prostate without lower urinary tract symptoms: Secondary | ICD-10-CM

## 2018-07-30 DIAGNOSIS — E7800 Pure hypercholesterolemia, unspecified: Secondary | ICD-10-CM

## 2018-07-30 DIAGNOSIS — G4733 Obstructive sleep apnea (adult) (pediatric): Secondary | ICD-10-CM | POA: Diagnosis not present

## 2018-07-30 DIAGNOSIS — Z794 Long term (current) use of insulin: Secondary | ICD-10-CM | POA: Diagnosis not present

## 2018-07-30 NOTE — Progress Notes (Signed)
Patient ID: AMIRE LEAZER, male   DOB: 05-29-1941, 77 y.o.   MRN: [id number]   Reason for Appointment: Annual physical exam and follow-up of various conditions  History of Present Illness   1. Type 2 DIABETES MELITUS, date of diagnosis:  1992        His blood sugar has been  difficult to control over the last few years because of variability in his blood sugars On basal bolus insulin for a few years and his oral hypoglycemic drugs have been continued Also did not tolerate GLP-1 drugs or symlin because of nausea He is essentially looking like a a type I patient with requiring relatively low amounts of insulin and twice a day dosage of basal insulin He has done somewhat better with Levemir compared to Lantus and is using this twice a day also  He has also had overall much better controlled with adding Invokana in 07/2016  Recent history:  Insulin regimen: insulin TRESIBA 14 units am.  Mealtime insulin: Breakfast = 2-3 units, lunch 2-3 units supper 9-10 units based on meal size  Oral hypoglycemic drugs: Actos, Invokana   100 mg  His A1c is 7.7 and about the same this year, has been as low as 6.9  Recent blood sugar patterns and problems:  He has only rarely checked his blood sugar on waking up and is getting one low reading and a couple of high readings but his fasting glucose on the lab was 144  No reported nocturnal hypoglycemia  Although he is eating peanut butter crackers in the morning as breakfast he is taking 2 units usually for this, not clear why he has several high readings around midday  Around noon with his lunch which may be a sandwich he is again taking only 2 or 3 units of mealtime insulin  Has only a couple of readings in the afternoon which are also high  Most of his blood sugar monitoring is done after supper close to bedtime with average blood sugar 155 and some variability but no hypoglycemia recently  However lowest blood sugars appear to be  around 6-7 PM mostly after he has been mowing the lawn and getting more active but does not do anything to prevent this  His weight appears to be gradually decreasing  Also continues to take Winkler which has helped keep his blood sugars more stable in the past   Hypoglycemia:  as above        Side effects from medications: None       Proper timing of medications in relation to meals: Yes.           Has Bfst at 9-10 am, lunch 12-1 PM dinner at 7-8 PM   Monitors blood glucose: QD    Glucometer: One Touch.           Blood Glucose readings from one touch zoom software download:     PRE-MEAL Fasting Lunch Dinner Bedtime Overall  Glucose range:  152, 187, 58  105-255  46-227  90-264   Mean/median:   151   140  148   POST-MEAL PC Breakfast PC Lunch PC Dinner  Glucose range:   130-252   Mean/median:          Physical activity: exercise:  gardening     Wt Readings from Last 3 Encounters:  07/30/18 158 lb 9.6 oz (71.9 kg)  07/08/18 160 lb (72.6 kg)  04/28/18 162 lb 6.4  oz (73.7 kg)           LABS:  Lab Results  Component Value Date   HGBA1C 7.7 (H) 07/27/2018   HGBA1C 7.5 (H) 04/24/2018   HGBA1C 7.6 (H) 01/09/2018   Lab Results  Component Value Date   MICROALBUR 1.0 04/24/2018   LDLCALC 94 04/24/2018   CREATININE 1.18 07/27/2018    2. Hypertension:    His blood pressure has been high for over 30 years and previously difficult to control. He was evaluated in 1/08 with urine metanephrines and renal artery ultrasound.    Currently on multiple medications including amlodipine, benazepril, carvedilol and doxazosin Also may be benefiting from [place] home blood pressure about 130-140 , checking periodically.   No history of renal dysfunction or microalbuminuria     3. PREVENTIVE CARE:  Annual hemoccults:           due    Zostavax:                             2012   Colonoscopy/sigmoidoscopy  2017   Flu vaccine yes  PSA: ?  Pneumovax: Prevnar:    2000 2015  Eye exams:  annual   Aspirin: Yes    Exercise: Gardening, some walking   Aspirin use: Takes 81 mg daily Diet: Usually relatively low fat Use of seatbelts and smoke detectors: Yes   Lab on 07/27/2018  Component Date Value Ref Range Status  . Sodium 07/27/2018 141  135 - 145 mEq/L Final  . Potassium 07/27/2018 4.2  3.5 - 5.1 mEq/L Final  . Chloride 07/27/2018 106  96 - 112 mEq/L Final  . CO2 07/27/2018 31  19 - 32 mEq/L Final  . Glucose, Bld 07/27/2018 144* 70 - 99 mg/dL Final  . BUN 07/27/2018 18  6 - 23 mg/dL Final  . Creatinine, Ser 07/27/2018 1.18  0.40 - 1.50 mg/dL Final  . Total Bilirubin 07/27/2018 0.5  0.2 - 1.2 mg/dL Final  . Alkaline Phosphatase 07/27/2018 38* 39 - 117 U/L Final  . AST 07/27/2018 20  0 - 37 U/L Final  . ALT 07/27/2018 15  0 - 53 U/L Final  . Total Protein 07/27/2018 6.2  6.0 - 8.3 g/dL Final  . Albumin 07/27/2018 3.8  3.5 - 5.2 g/dL Final  . Calcium 07/27/2018 9.3  8.4 - 10.5 mg/dL Final  . GFR 07/27/2018 64.02  >60.00 mL/min Final  . Hgb A1c MFr Bld 07/27/2018 7.7* 4.6 - 6.5 % Final   Glycemic Control Guidelines for People with Diabetes:Non Diabetic:  <6%Goal of Therapy: <7%Additional Action Suggested:  >8%     Allergies as of 07/30/2018      Reactions   Glycopyrrolate Rash      Medication List        Accurate as of 07/30/18  4:24 PM. Always use your most recent med list.          amLODipine 10 MG tablet Commonly known as:  NORVASC Take 0.5 tablets (5 mg total) by mouth daily.   aspirin 81 MG EC tablet Take 81 mg by mouth daily. Swallow whole.   benazepril 20 MG tablet Commonly known as:  LOTENSIN Take 1 tablet (20 mg total) by mouth daily.   canagliflozin 100 MG Tabs tablet Commonly known as:  INVOKANA TAKE 1 TABLET BY MOUTH DAILY BEFORE BREAKFAST   carvedilol 25 MG tablet Commonly known as:  COREG TAKE ONE TABLET BY MOUTH  TWICE DAILY   ciprofloxacin 500 MG tablet Commonly known as:  CIPRO Take 1 tablet (500 mg  total) by mouth 2 (two) times daily.   doxazosin 4 MG tablet Commonly known as:  CARDURA TAKE ONE TABLET BY MOUTH AT BEDTIME   DUREZOL 0.05 % Emul Generic drug:  Difluprednate Place 1 drop into the right eye 2 (two) times daily.   esomeprazole 40 MG capsule Commonly known as:  NEXIUM Take 40 mg by mouth daily at 12 noon.   FLORASTOR 250 MG capsule Generic drug:  saccharomyces boulardii Take 250 mg by mouth 2 (two) times daily.   glucagon 1 MG injection Inject 1 mg into the vein once as needed.   HUMALOG KWIKPEN 100 UNIT/ML KiwkPen Generic drug:  insulin lispro INJECT 3-9 UNITS AT BREAKFAST, 3-4 UNITS AT LUNCH AND 6-9 UNITS AT DINNER   insulin degludec 100 UNIT/ML Sopn FlexTouch Pen Commonly known as:  TRESIBA Give 12 units daily. If fasting blood sugars are over 140 increase dosage up to 15 units daily as directed.   ONE TOUCH ULTRA TEST test strip Generic drug:  glucose blood USE ONE STRIP TO CHECK GLUCOSE THREE TIMES DAILY   ONETOUCH VERIO test strip Generic drug:  glucose blood USE 1 STRIP TO CHECK GLUCOSE THREE TIMES DAILY   pioglitazone 15 MG tablet Commonly known as:  ACTOS take 1 tablet by mouth once daily   rosuvastatin 5 MG tablet Commonly known as:  CRESTOR Take 1 tablet (5 mg total) by mouth every morning.   sildenafil 20 MG tablet Commonly known as:  REVATIO TAKE 5 TABLETS BY MOUTH AS NEEDED once a week.   zolpidem 6.25 MG CR tablet Commonly known as:  AMBIEN CR Take 1 tablet (6.25 mg total) by mouth at bedtime as needed. for sleep       Allergies:  Allergies  Allergen Reactions  . Glycopyrrolate Rash    Past Medical History:  Diagnosis Date  . CLL (chronic lymphocytic leukemia) ([place])   . Diabetes mellitus without complication ([place])   . GERD (gastroesophageal reflux disease)   . Hyperlipidemia   . Hypertension   . Lymphocytosis   . Sleep apnea     Past Surgical History:  Procedure Laterality Date  . BLEPHAROPLASTY    .  COLONOSCOPY  2012  . HEMORRHOID SURGERY     Sclerotherapy  . KNEE SURGERY Right 2002  . POLYPECTOMY    . SHOULDER SURGERY Right 2007    Family History  Problem Relation Age of Onset  . Kidney disease Mother   . CAD Maternal Aunt   . Colon cancer Neg Hx   . Esophageal cancer Neg Hx   . Rectal cancer Neg Hx   . Stomach cancer Neg Hx     Social History:  reports that he has never smoked. He quit smokeless tobacco use about 5 years ago.  His smokeless tobacco use included chew. He reports that he drinks about 6.0 standard drinks of alcohol per week. He reports that he does not use drugs.  Review of Systems:  Constitutional: Has had only mild gradual weight loss, no change in appetite  Wt Readings from Last 3 Encounters:  07/30/18 158 lb 9.6 oz (71.9 kg)  07/08/18 160 lb (72.6 kg)  04/28/18 162 lb 6.4 oz (73.7 kg)    ENT: Negative for hearing loss or nasal congestion.    Eyes:  normal vision.  has been followed by a retina specialist for reportedly thickening of the retina, not  related to diabetic retinopathy apparently  Respiratory: Negative for cough and shortness of breath.    Cardiovascular: No history of chest pain, palpitations, pain in legs on walking and no and leg swelling.   Gastrointestinal: Negative for abdominal pain and constipation May have heartburn or reflux and will take Nexium most of the time  Genitourinary: Negative for urgency, or reduced stream and frequency.       Has erectile dysfunction, long-standing and has recently inadequate results with using Sildenafil 100 mg Usually having 3-4x nocturia which is not new  Musculoskeletal: Negative for myalgias.       Pain in knees at times  Neurological: No significant numbness or sharp pains in his feet  Negative for dizziness No headaches.  No symptoms of tingling or numbness in his feet  Psychiatric: Negative for depression and memory loss. The patient has chronic insomnia.         Takes Ambien  every night for sleep, some hangover occurring with the Ambien CR but previously with the regular Ambien he would have late insomnia   HYPERLIPIDEMIA: The lipid abnormality consists of elevated LDL which is well controlled with 5 mg Crestor   Lab Results  Component Value Date   CHOL 160 04/24/2018   HDL 47.80 04/24/2018   LDLCALC 94 04/24/2018   TRIG 94.0 04/24/2018   CHOLHDL 3 04/24/2018    Last colonoscopy was in 07/2016 Has not done Hemoccult  No results found for: OCCULTBLD     He has  CLL which is followed is by the hematologist every 6 months now He was told that he needed no treatment Usually WBC count is 70,000 +  Currently denies any night sweats, weight loss, decreased appetite or weakness Does not have recent anemia   Diagnosed with sleep apnea (11/09) treated with CPAP, off treatment for 2 years, not clear which pulmonologist prescribed this  He says that he was having issues with the CPAP machine and stopped using it Now he is complaining off daytime symptoms of fatigue, also he will tend to be sleepy few sitting still but not otherwise Snoring has been worse Also.    Examination:   BP 130/72   Pulse 63   Ht 5' 7"  (1.702 m)   Wt 158 lb 9.6 oz (71.9 kg)   SpO2 95%   BMI 24.84 kg/m   Body mass index is 24.84 kg/m.   GENERAL:  Average build.  Well-nourished  No pallor  Skin:  no rash or skin lesions  EYES:  Externally normal.  Fundii:  normal discs and vessels, fundi not completely visible on the right.  ENT: . Oral cavity & pharynx normal.  NECK: No lymphadenopathy.   Carotids: no bruit. THYROID:  Not palpable.  HEART:  Normal apex, S1 and S2; no murmur or click.  CHEST: He has gynecomastia bilaterally  Lungs:  Vescicular breath sounds heard equally.  No crepitations/ wheeze.  ABDOMEN: Not distended    Liver/spleen not palpable.  No other mass or tenderness.     RECTAL exam: No mass  Prostate: Feels nodular, relatively firm and 2+  enlarged, more prominent nodule on the right    NEUROLOGICAL:  Reflexes: absent at ankles and normal at biceps  Extremities: No pedal edema   Diabetic Foot Exam - Simple   Simple Foot Form Diabetic Foot exam was performed with the following findings:  Yes   Visual Inspection No deformities, no ulcerations, no other skin breakdown bilaterally:  Yes Sensation Testing Intact to  touch and monofilament testing bilaterally:  Yes Pulse Check Posterior Tibialis and Dorsalis pulse intact bilaterally:  Yes Comments     Vibration mildly reduced at distal toes bilaterally.  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    No swelling or deformity of other joints.   ASSESSMENT/ PLAN:   Diabetes type 2, insulin-dependent and nonobese  See history of present illness for detailed discussion of his current management, blood sugar patterns and problems identified  His A1c is about the same at 7.7% although previously has been as low as 6.9  As discussed above he has slightly inadequate control of postprandial readings especially at lunch However since he usually does not check fasting readings not clear if he is getting adequate amounts of Tresiba Lab fasting glucose was 144  Discussed with him that he is eating a fair amount of carbohydrates at lunch usually with sandwich or grits and does not take more than 2 or 3 units of insulin for this Without checking postprandial readings he is not able to adjust his breakfast and lunchtime doses appropriately Hypoglycemia has been occasional and mostly related to increase physical activity in the late afternoon without taking a snack  Recommended the following today  To check fasting blood sugar more regularly to help adjust to see by doing  Most likely needs at least 2 more units to cover his lunch to keep his sugars from being higher in the afternoon and is very active  Have a carbohydrate snack before going to work in the yard  Stay on  [employer] with some protein at each meal  Diabetic complications: No significant neuropathy, has erectile dysfunction, no evidence of nephropathy or history of retinopathy  Hyperlipidemia: Adequately controlled on 5 mg Crestor as of 5/19  HYPERTENSION: On multiple drugs Well controlled and will stay on the same regimen   Sleep apnea, previously treated with CPAP and appears to be symptomatic now with fatigue, somnolence during the day and excessive snoring at night. Discussed that he does need to start treatment again and he will contact the previously treating physician to get the new CPAP equipment  History of gastroesophageal reflux with occasional dysphagia, recently better   PROSTATE enlargement: He appears to have a nodular prostate will need to have a screening PSA to rule out early malignancy  GYNECOMASTIA: He has not had any evaluation of testosterone and his gynecomastia appears more significant Because of his fatigue needs evaluation of his testosterone and LH  CLL: Stable.  Followed by oncologist every 6 months now Currently no systemic symptoms present despite very high white cell count  INSOMNIA: Continue Ambien CR May consider reducing the dose  Preventive care:  He is up-to-date on immunizations  To continue regular eye exams as recommended with general and retinal specialist, need to get reports from his exams PSA to be checked especially with his nodular prostate on exam Continue regular dental exams Continue 81 mg aspirin   Stool Hemoccult to be done and Hemoccult kit was given  Counseling time for the evaluation and management component of today's visit was over 50 % and required 25 minutes  Patient Instructions  Check blood sugars on waking up  4/7  Also check blood sugars about 2 hours after a meal and do this after different meals by rotation  Recommended blood sugar levels on waking up is 90-130 and about 2 hours after meal is  130-160  Please bring your blood sugar monitor to each visit,  thank you  Cover 1-3 units more for lunch  Snack before mowing  Check on CPAP    Ahyan Kreeger 07/30/2018, 4:24 PM

## 2018-07-30 NOTE — Patient Instructions (Addendum)
Check blood sugars on waking up  4/7  Also check blood sugars about 2 hours after a meal and do this after different meals by rotation  Recommended blood sugar levels on waking up is 90-130 and about 2 hours after meal is 130-160  Please bring your blood sugar monitor to each visit, thank you  Cover 1-3 units more for lunch  Snack before mowing  Check on CPAP

## 2018-07-31 ENCOUNTER — Other Ambulatory Visit: Payer: Self-pay | Admitting: Endocrinology

## 2018-07-31 DIAGNOSIS — N62 Hypertrophy of breast: Secondary | ICD-10-CM

## 2018-08-02 ENCOUNTER — Other Ambulatory Visit: Payer: Self-pay | Admitting: Endocrinology

## 2018-08-24 ENCOUNTER — Other Ambulatory Visit (INDEPENDENT_AMBULATORY_CARE_PROVIDER_SITE_OTHER): Payer: Medicare HMO

## 2018-08-24 DIAGNOSIS — N62 Hypertrophy of breast: Secondary | ICD-10-CM | POA: Diagnosis not present

## 2018-08-24 DIAGNOSIS — N402 Nodular prostate without lower urinary tract symptoms: Secondary | ICD-10-CM

## 2018-08-24 LAB — PSA, MEDICARE: PSA: 0.24 ng/mL (ref 0.10–4.00)

## 2018-08-26 ENCOUNTER — Other Ambulatory Visit: Payer: Medicare HMO

## 2018-08-26 ENCOUNTER — Other Ambulatory Visit: Payer: Self-pay | Admitting: Endocrinology

## 2018-08-26 DIAGNOSIS — N62 Hypertrophy of breast: Secondary | ICD-10-CM

## 2018-08-27 LAB — TESTOSTERONE, FREE, TOTAL, SHBG
Sex Hormone Binding: 74.8 nmol/L (ref 19.3–76.4)
TESTOSTERONE FREE: 8 pg/mL (ref 6.6–18.1)
Testosterone: 590 ng/dL (ref 264–916)

## 2018-08-27 NOTE — Progress Notes (Signed)
Please call to let patient know that the lab results are normal and no further action needed

## 2018-09-01 ENCOUNTER — Telehealth: Payer: Self-pay | Admitting: Endocrinology

## 2018-09-01 NOTE — Telephone Encounter (Signed)
Pt is in the donut hole and cannot afford his tresiba we do have some pens I have labeled one for him to pick up tomorrow

## 2018-09-08 ENCOUNTER — Telehealth: Payer: Self-pay

## 2018-09-08 NOTE — Telephone Encounter (Signed)
14 units as before unless morning sugars are staying consistently under 90 or over 130

## 2018-09-08 NOTE — Telephone Encounter (Signed)
Patient received a tresiba u 200 pen as a sample and wants to know what the dose should be please advise

## 2018-09-09 NOTE — Telephone Encounter (Signed)
Pts wife Tony Marshall is aware

## 2018-09-15 ENCOUNTER — Ambulatory Visit (INDEPENDENT_AMBULATORY_CARE_PROVIDER_SITE_OTHER): Payer: Medicare HMO | Admitting: Endocrinology

## 2018-09-15 DIAGNOSIS — Z23 Encounter for immunization: Secondary | ICD-10-CM

## 2018-09-15 NOTE — Progress Notes (Signed)
Pt is here today for flu shot (high dose)

## 2018-09-22 DIAGNOSIS — R69 Illness, unspecified: Secondary | ICD-10-CM | POA: Diagnosis not present

## 2018-09-27 ENCOUNTER — Other Ambulatory Visit: Payer: Self-pay | Admitting: Endocrinology

## 2018-10-11 ENCOUNTER — Other Ambulatory Visit: Payer: Self-pay | Admitting: Endocrinology

## 2018-10-15 ENCOUNTER — Other Ambulatory Visit: Payer: Self-pay | Admitting: Endocrinology

## 2018-10-15 DIAGNOSIS — R69 Illness, unspecified: Secondary | ICD-10-CM | POA: Diagnosis not present

## 2018-10-15 DIAGNOSIS — Z794 Long term (current) use of insulin: Principal | ICD-10-CM

## 2018-10-15 DIAGNOSIS — E1165 Type 2 diabetes mellitus with hyperglycemia: Secondary | ICD-10-CM

## 2018-10-15 MED ORDER — GLUCOSE BLOOD VI STRP: ORAL_STRIP | 3 refills | Status: DC

## 2018-10-23 DIAGNOSIS — H31092 Other chorioretinal scars, left eye: Secondary | ICD-10-CM | POA: Diagnosis not present

## 2018-10-23 DIAGNOSIS — H35371 Puckering of macula, right eye: Secondary | ICD-10-CM | POA: Diagnosis not present

## 2018-10-23 DIAGNOSIS — E113292 Type 2 diabetes mellitus with mild nonproliferative diabetic retinopathy without macular edema, left eye: Secondary | ICD-10-CM | POA: Diagnosis not present

## 2018-10-23 DIAGNOSIS — E113211 Type 2 diabetes mellitus with mild nonproliferative diabetic retinopathy with macular edema, right eye: Secondary | ICD-10-CM | POA: Diagnosis not present

## 2018-10-27 ENCOUNTER — Telehealth: Payer: Self-pay | Admitting: Internal Medicine

## 2018-10-27 ENCOUNTER — Encounter: Payer: Self-pay | Admitting: Internal Medicine

## 2018-10-27 ENCOUNTER — Inpatient Hospital Stay: Payer: Medicare HMO

## 2018-10-27 ENCOUNTER — Inpatient Hospital Stay: Payer: Medicare HMO | Attending: Internal Medicine | Admitting: Internal Medicine

## 2018-10-27 VITALS — BP 137/92 | HR 65 | Temp 98.2°F | Resp 18 | Ht 67.0 in | Wt 161.8 lb

## 2018-10-27 DIAGNOSIS — Z79899 Other long term (current) drug therapy: Secondary | ICD-10-CM | POA: Diagnosis not present

## 2018-10-27 DIAGNOSIS — C9110 Chronic lymphocytic leukemia of B-cell type not having achieved remission: Secondary | ICD-10-CM | POA: Diagnosis not present

## 2018-10-27 DIAGNOSIS — I10 Essential (primary) hypertension: Secondary | ICD-10-CM

## 2018-10-27 LAB — CBC WITH DIFFERENTIAL (CANCER CENTER ONLY)
Abs Immature Granulocytes: 0.08 10*3/uL — ABNORMAL HIGH (ref 0.00–0.07)
BASOS PCT: 0 %
Basophils Absolute: 0.1 10*3/uL (ref 0.0–0.1)
EOS ABS: 0.2 10*3/uL (ref 0.0–0.5)
EOS PCT: 0 %
HEMATOCRIT: 37.9 % — AB (ref 39.0–52.0)
HEMOGLOBIN: 11.9 g/dL — AB (ref 13.0–17.0)
Immature Granulocytes: 0 %
Lymphocytes Relative: 81 %
Lymphs Abs: 45.2 10*3/uL — ABNORMAL HIGH (ref 0.7–4.0)
MCH: 30.7 pg (ref 26.0–34.0)
MCHC: 31.4 g/dL (ref 30.0–36.0)
MCV: 97.7 fL (ref 80.0–100.0)
MONO ABS: 6.6 10*3/uL — AB (ref 0.1–1.0)
MONOS PCT: 12 %
NEUTROS ABS: 3.6 10*3/uL (ref 1.7–7.7)
Neutrophils Relative %: 7 %
Platelet Count: 209 10*3/uL (ref 150–400)
RBC: 3.88 MIL/uL — ABNORMAL LOW (ref 4.22–5.81)
RDW: 15.6 % — AB (ref 11.5–15.5)
WBC Count: 55.8 10*3/uL (ref 4.0–10.5)
nRBC: 0 % (ref 0.0–0.2)

## 2018-10-27 LAB — CMP (CANCER CENTER ONLY)
ALK PHOS: 43 U/L (ref 38–126)
ALT: 15 U/L (ref 0–44)
AST: 18 U/L (ref 15–41)
Albumin: 3.7 g/dL (ref 3.5–5.0)
Anion gap: 6 (ref 5–15)
BILIRUBIN TOTAL: 0.4 mg/dL (ref 0.3–1.2)
BUN: 22 mg/dL (ref 8–23)
CALCIUM: 9.1 mg/dL (ref 8.9–10.3)
CO2: 28 mmol/L (ref 22–32)
CREATININE: 1.38 mg/dL — AB (ref 0.61–1.24)
Chloride: 106 mmol/L (ref 98–111)
GFR, EST NON AFRICAN AMERICAN: 49 mL/min — AB (ref >60)
GFR, Est AFR Am: 57 mL/min — ABNORMAL LOW (ref >60)
Glucose, Bld: 246 mg/dL — ABNORMAL HIGH (ref 70–99)
POTASSIUM: 5.1 mmol/L (ref 3.5–5.1)
Sodium: 140 mmol/L (ref 135–145)
TOTAL PROTEIN: 6.2 g/dL — AB (ref 6.5–8.1)

## 2018-10-27 LAB — LACTATE DEHYDROGENASE: LDH: 252 U/L — ABNORMAL HIGH (ref 98–192)

## 2018-10-27 NOTE — Progress Notes (Signed)
[facility] [phone]   [phone]  OFFICE PROGRESS NOTE  Elayne Snare, MD [address] [place] [address]  DIAGNOSIS: Chronic lymphocytic leukemia diagnosed in October 2014.  PRIOR THERAPY: None  CURRENT THERAPY: Observation.  INTERVAL HISTORY: Tony Marshall 77 y.o. male returns to the clinic today for follow-up visit accompanied by his wife.  The patient is feeling fine today with no concerning complaints.  He denied having any weight loss or night sweats.  He denied having any chest pain, shortness of breath, cough or hemoptysis.  He has no nausea, vomiting, diarrhea or constipation.  He denied having any fever or chills.  He had repeat CBC performed earlier today and he is here for evaluation and discussion of his his lab results.  MEDICAL HISTORY: Past Medical History:  Diagnosis Date  . CLL (chronic lymphocytic leukemia) ([place])   . Diabetes mellitus without complication ([place])   . GERD (gastroesophageal reflux disease)   . Hyperlipidemia   . Hypertension   . Lymphocytosis   . Sleep apnea     ALLERGIES:  is allergic to glycopyrrolate.  MEDICATIONS:  Current Outpatient Medications  Medication Sig Dispense Refill  . amLODipine (NORVASC) 10 MG tablet Take 0.5 tablets (5 mg total) by mouth daily. 90 tablet 3  . aspirin 81 MG EC tablet Take 81 mg by mouth daily. Swallow whole.    . benazepril (LOTENSIN) 20 MG tablet TAKE ONE TABLET BY MOUTH ONE TIME DAILY  90 tablet 0  . canagliflozin (INVOKANA) 100 MG TABS tablet TAKE 1 TABLET BY MOUTH DAILY BEFORE BREAKFAST 90 tablet 3  . carvedilol (COREG) 25 MG tablet TAKE ONE TABLET BY MOUTH TWICE DAILY  180 tablet 0  . doxazosin (CARDURA) 4 MG tablet TAKE 1 TABLET BY MOUTH AT BEDTIME 90 tablet 0  . DUREZOL 0.05 % EMUL Place 1 drop into the right eye 2 (two) times daily.    Marland Kitchen esomeprazole (NEXIUM) 40 MG capsule Take 40 mg by mouth daily at 12 noon.    Marland Kitchen glucagon (GLUCAGON  EMERGENCY) 1 MG injection Inject 1 mg into the vein once as needed. 1 each 5  . glucose blood (ONETOUCH VERIO) test strip USE 1 STRIP TO CHECK GLUCOSE THREE TIMES DAILY 100 each 3  . HUMALOG KWIKPEN 100 UNIT/ML KiwkPen INJECT 3-9 UNITS AT BREAKFAST, 3-4 UNITS AT LUNCH AND 6-9 UNITS AT DINNER 45 mL 0  . pioglitazone (ACTOS) 15 MG tablet take 1 tablet by mouth once daily 30 tablet 0  . pioglitazone (ACTOS) 15 MG tablet take 1 tablet by mouth daily 90 tablet 0  . rosuvastatin (CRESTOR) 5 MG tablet Take 1 tablet (5 mg total) by mouth every morning. 90 tablet 3  . saccharomyces boulardii (FLORASTOR) 250 MG capsule Take 250 mg by mouth 2 (two) times daily.    . sildenafil (REVATIO) 20 MG tablet TAKE 5 TABLETS BY MOUTH AS NEEDED once a week. 30 tablet 2  . TRESIBA FLEXTOUCH 100 UNIT/ML SOPN FlexTouch Pen inject 12 units daily. if fasting blood sugars are over 140 increase dosage up to 15 units daily as directed. 6 mL 2  . zolpidem (AMBIEN CR) 6.25 MG CR tablet Take 1 tablet (6.25 mg total) by mouth at bedtime as needed. for sleep 30 tablet 0   No current facility-administered medications for this visit.     SURGICAL HISTORY:  Past Surgical History:  Procedure Laterality Date  . BLEPHAROPLASTY    .  COLONOSCOPY  2012  . HEMORRHOID SURGERY     Sclerotherapy  . KNEE SURGERY Right 2002  . POLYPECTOMY    . SHOULDER SURGERY Right 2007    REVIEW OF SYSTEMS:  A comprehensive review of systems was negative.   PHYSICAL EXAMINATION: General appearance: alert, cooperative and no distress Head: Normocephalic, without obvious abnormality, atraumatic Neck: no adenopathy, no JVD, supple, symmetrical, trachea midline and thyroid not enlarged, symmetric, no tenderness/mass/nodules Lymph nodes: Cervical, supraclavicular, and axillary nodes normal. Resp: clear to auscultation bilaterally Back: symmetric, no curvature. ROM normal. No CVA tenderness. Cardio: regular rate and rhythm, S1, S2 normal, no murmur,  click, rub or gallop GI: soft, non-tender; bowel sounds normal; no masses,  no organomegaly Extremities: extremities normal, atraumatic, no cyanosis or edema  ECOG PERFORMANCE STATUS: 1 - Symptomatic but completely ambulatory  Blood pressure (!) 137/92, pulse 65, temperature 98.2 F (36.8 C), temperature source Oral, resp. rate 18, height 5\' 7"  (1.702 m), weight 161 lb 12.8 oz (73.4 kg), SpO2 98 %.  LABORATORY DATA: Lab Results  Component Value Date   WBC 55.8 (HH) 10/27/2018   HGB 11.9 (L) 10/27/2018   HCT 37.9 (L) 10/27/2018   MCV 97.7 10/27/2018   PLT 209 10/27/2018      Chemistry      Component Value Date/Time   NA 141 07/27/2018 1507   NA 141 08/06/2017 1306   K 4.2 07/27/2018 1507   K 4.3 08/06/2017 1306   CL 106 07/27/2018 1507   CO2 31 07/27/2018 1507   CO2 25 08/06/2017 1306   BUN 18 07/27/2018 1507   BUN 22.9 08/06/2017 1306   CREATININE 1.18 07/27/2018 1507   CREATININE 1.3 08/06/2017 1306      Component Value Date/Time   CALCIUM 9.3 07/27/2018 1507   CALCIUM 9.3 08/06/2017 1306   ALKPHOS 38 (L) 07/27/2018 1507   ALKPHOS 47 08/06/2017 1306   AST 20 07/27/2018 1507   AST 20 08/06/2017 1306   ALT 15 07/27/2018 1507   ALT 19 08/06/2017 1306   BILITOT 0.5 07/27/2018 1507   BILITOT 0.46 08/06/2017 1306       RADIOGRAPHIC STUDIES: No results found.  ASSESSMENT AND PLAN:  This is a very pleasant 77 years old white male with chronic lymphocytic leukemia currently on observation. He continues to do well with no concerning complaints. CBC today showed stable white blood count 55,800.  I discussed the lab results with the patient today.  I recommended for him to continue on observation with repeat CBC, comprehensive metabolic panel and LDH in 6 months. The patient was advised to call immediately if he has any concerning symptoms in the interval. The patient voices understanding of current disease status and treatment options and is in agreement with the  current care plan. All questions were answered. The patient knows to call the clinic with any problems, questions or concerns. We can certainly see the patient much sooner if necessary. I spent 10 minutes counseling the patient face to face. The total time spent in the appointment was 15 minutes.  Disclaimer: This note was dictated with voice recognition software. Similar sounding words can inadvertently be transcribed and may not be corrected upon review.

## 2018-10-27 NOTE — Telephone Encounter (Signed)
Scheduled appt per 11/12 los- gave patient AVS and calender per los.   

## 2018-11-02 ENCOUNTER — Other Ambulatory Visit: Payer: Self-pay | Admitting: Endocrinology

## 2018-11-02 ENCOUNTER — Other Ambulatory Visit (INDEPENDENT_AMBULATORY_CARE_PROVIDER_SITE_OTHER): Payer: Medicare HMO

## 2018-11-02 DIAGNOSIS — E1165 Type 2 diabetes mellitus with hyperglycemia: Secondary | ICD-10-CM

## 2018-11-02 DIAGNOSIS — Z794 Long term (current) use of insulin: Secondary | ICD-10-CM | POA: Diagnosis not present

## 2018-11-02 LAB — BASIC METABOLIC PANEL
BUN: 23 mg/dL (ref 6–23)
CHLORIDE: 102 meq/L (ref 96–112)
CO2: 30 mEq/L (ref 19–32)
CREATININE: 1.37 mg/dL (ref 0.40–1.50)
Calcium: 8.9 mg/dL (ref 8.4–10.5)
GFR: 53.85 mL/min — AB (ref >60.00)
Glucose, Bld: 340 mg/dL — ABNORMAL HIGH (ref 70–99)
Potassium: 3.9 mEq/L (ref 3.5–5.1)
Sodium: 138 mEq/L (ref 135–145)

## 2018-11-02 LAB — HEMOGLOBIN A1C: HEMOGLOBIN A1C: 7.1 % — AB (ref 4.6–6.5)

## 2018-11-04 ENCOUNTER — Other Ambulatory Visit: Payer: Self-pay

## 2018-11-04 MED ORDER — CARVEDILOL 25 MG PO TABS: 25.0000 mg | ORAL_TABLET | Freq: Two times a day (BID) | ORAL | 0 refills | Status: DC

## 2018-11-05 ENCOUNTER — Encounter: Payer: Self-pay | Admitting: Endocrinology

## 2018-11-05 ENCOUNTER — Ambulatory Visit: Payer: Medicare HMO | Admitting: Endocrinology

## 2018-11-05 VITALS — BP 110/72 | HR 57 | Ht 67.5 in | Wt 158.0 lb

## 2018-11-05 DIAGNOSIS — I10 Essential (primary) hypertension: Secondary | ICD-10-CM | POA: Diagnosis not present

## 2018-11-05 DIAGNOSIS — Z794 Long term (current) use of insulin: Secondary | ICD-10-CM

## 2018-11-05 DIAGNOSIS — E7800 Pure hypercholesterolemia, unspecified: Secondary | ICD-10-CM

## 2018-11-05 DIAGNOSIS — E1165 Type 2 diabetes mellitus with hyperglycemia: Secondary | ICD-10-CM | POA: Diagnosis not present

## 2018-11-05 NOTE — Progress Notes (Signed)
Patient ID: Tony Marshall, male   DOB: 08-Jan-1941, 77 y.o.   MRN: [id number]   Reason for Appointment:  follow-up of various conditions  History of Present Illness   1. Type 2 DIABETES MELITUS, date of diagnosis:  1992        His blood sugar has been  difficult to control over the last few years because of variability in his blood sugars On basal bolus insulin for a few years and his oral hypoglycemic drugs have been continued Also did not tolerate GLP-1 drugs or symlin because of nausea He is essentially looking like a a type I patient with requiring relatively low amounts of insulin and twice a day dosage of basal insulin He has done somewhat better with Levemir compared to Lantus and is using this twice a day also  He has also had overall much better controlled with adding Invokana in 07/2016  Recent history:  Insulin regimen: insulin TRESIBA 14 units am.  Mealtime insulin: Breakfast = 2-3 units, lunch 2-3 units supper 5-6 units based on meal size  Oral hypoglycemic drugs: Actos, Invokana  100 mg  His A1c is 7.1 compared to 7.7 and somewhat better than before  Recent blood sugar patterns and problems:  Although overall his blood sugars are fairly good he has not checked fasting readings much  Morning readings were looking higher for a few days at the end of last month but fairly good now  Most of the time he is checking sugars AFTER his peanut butter crackers in the morning for which he takes 2 units  Although he is frequently eating a sandwich at lunchtime he is only taking 2 to 3 units and most of his readings in the afternoon are high and appear to be the HIGHEST of the day  He and his wife state that they are having healthier meals and appears to be getting less insulin to cover his evening meal  Also may occasionally have a low blood sugar, about twice in the last month after supper from over estimation of his insulin dose  His lab glucose was over 300 in  the afternoon and he thinks he may have forgotten his lunchtime NovoLog coverage which he may occasionally do also  His Tyler Aas has been continued unchanged since no consistent pattern of fasting readings seen in has not done these recently also  No overnight hypoglycemia  His weight is about the same   Hypoglycemia:  as above        Side effects from medications: None       Proper timing of medications in relation to meals: Yes.           Has Bfst at 9-10 am, lunch 12-1 PM dinner at 7-8 PM   Monitors blood glucose: QD    Glucometer: One Touch.           Blood Glucose readings from one touch zoom software download:    PRE-MEAL  9-11 AM Lunch Dinner Bedtime Overall  Glucose range:  100-199      Mean/median:  143     124   POST-MEAL PC Breakfast PC Lunch PC Dinner  Glucose range:   107-304  54-182  Mean/median:   220  122   Previous readings:  PRE-MEAL Fasting Lunch Dinner Bedtime Overall  Glucose range:  152, 187, 58  105-255  46-227  90-264   Mean/median:   151   140  148  POST-MEAL PC Breakfast PC Lunch PC Dinner  Glucose range:   130-252   Mean/median:          Physical activity: exercise: Mostly working around the house    Wt Readings from Last 3 Encounters:  11/05/18 158 lb (71.7 kg)  10/27/18 161 lb 12.8 oz (73.4 kg)  07/30/18 158 lb 9.6 oz (71.9 kg)           LABS:  Lab Results  Component Value Date   HGBA1C 7.1 (H) 11/02/2018   HGBA1C 7.7 (H) 07/27/2018   HGBA1C 7.5 (H) 04/24/2018   Lab Results  Component Value Date   MICROALBUR 1.0 04/24/2018   LDLCALC 94 04/24/2018   CREATININE 1.37 11/02/2018    2. Hypertension:    His blood pressure has been high for over 30 years and previously difficult to control. He was evaluated in 1/08 with urine metanephrines and renal artery ultrasound.    Currently on multiple medications including amlodipine, benazepril, carvedilol 25 mg and doxazosin Not on diuretic because of being on Invokana      Lab  on 11/02/2018  Component Date Value Ref Range Status  . Sodium 11/02/2018 138  135 - 145 mEq/L Final  . Potassium 11/02/2018 3.9  3.5 - 5.1 mEq/L Final  . Chloride 11/02/2018 102  96 - 112 mEq/L Final  . CO2 11/02/2018 30  19 - 32 mEq/L Final  . Glucose, Bld 11/02/2018 340* 70 - 99 mg/dL Final  . BUN 11/02/2018 23  6 - 23 mg/dL Final  . Creatinine, Ser 11/02/2018 1.37  0.40 - 1.50 mg/dL Final  . Calcium 11/02/2018 8.9  8.4 - 10.5 mg/dL Final  . GFR 11/02/2018 53.85* >60.00 mL/min Final  . Hgb A1c MFr Bld 11/02/2018 7.1* 4.6 - 6.5 % Final   Glycemic Control Guidelines for People with Diabetes:Non Diabetic:  <6%Goal of Therapy: <7%Additional Action Suggested:  >8%     Allergies as of 11/05/2018      Reactions   Glycopyrrolate Rash      Medication List        Accurate as of 11/05/18 11:59 PM. Always use your most recent med list.          amLODipine 10 MG tablet Commonly known as:  NORVASC Take 0.5 tablets (5 mg total) by mouth daily.   aspirin 81 MG EC tablet Take 81 mg by mouth daily. Swallow whole.   benazepril 20 MG tablet Commonly known as:  LOTENSIN TAKE ONE TABLET BY MOUTH ONE TIME DAILY   canagliflozin 100 MG Tabs tablet Commonly known as:  INVOKANA TAKE 1 TABLET BY MOUTH DAILY BEFORE BREAKFAST   carvedilol 25 MG tablet Commonly known as:  COREG Take 1 tablet (25 mg total) by mouth 2 (two) times daily.   doxazosin 4 MG tablet Commonly known as:  CARDURA TAKE ONE TABLET BY MOUTH AT BEDTIME   DUREZOL 0.05 % Emul Generic drug:  Difluprednate Place 1 drop into the right eye 2 (two) times daily.   esomeprazole 40 MG capsule Commonly known as:  NEXIUM Take 40 mg by mouth daily at 12 noon.   FLORASTOR 250 MG capsule Generic drug:  saccharomyces boulardii Take 250 mg by mouth 2 (two) times daily.   glucagon 1 MG injection Inject 1 mg into the vein once as needed.   glucose blood test strip USE 1 STRIP TO CHECK GLUCOSE THREE TIMES DAILY   HUMALOG  KWIKPEN 100 UNIT/ML KwikPen Generic drug:  insulin lispro INJECT 3-9 UNITS  AT BREAKFAST, 3-4 UNITS AT LUNCH AND 6-9 UNITS AT DINNER   pioglitazone 15 MG tablet Commonly known as:  ACTOS take 1 tablet by mouth once daily   pioglitazone 15 MG tablet Commonly known as:  ACTOS take 1 tablet by mouth daily   rosuvastatin 5 MG tablet Commonly known as:  CRESTOR Take 1 tablet (5 mg total) by mouth every morning.   sildenafil 20 MG tablet Commonly known as:  REVATIO TAKE 5 TABLETS BY MOUTH AS NEEDED once a week.   TRESIBA FLEXTOUCH 100 UNIT/ML Sopn FlexTouch Pen Generic drug:  insulin degludec inject 12 units daily. if fasting blood sugars are over 140 increase dosage up to 15 units daily as directed.   zolpidem 6.25 MG CR tablet Commonly known as:  AMBIEN CR Take 1 tablet (6.25 mg total) by mouth at bedtime as needed. for sleep       Allergies:  Allergies  Allergen Reactions  . Glycopyrrolate Rash    Past Medical History:  Diagnosis Date  . CLL (chronic lymphocytic leukemia) ([place])   . Diabetes mellitus without complication ([place])   . GERD (gastroesophageal reflux disease)   . Hyperlipidemia   . Hypertension   . Lymphocytosis   . Sleep apnea     Past Surgical History:  Procedure Laterality Date  . BLEPHAROPLASTY    . COLONOSCOPY  2012  . HEMORRHOID SURGERY     Sclerotherapy  . KNEE SURGERY Right 2002  . POLYPECTOMY    . SHOULDER SURGERY Right 2007    Family History  Problem Relation Age of Onset  . Kidney disease Mother   . CAD Maternal Aunt   . Colon cancer Neg Hx   . Esophageal cancer Neg Hx   . Rectal cancer Neg Hx   . Stomach cancer Neg Hx     Social History:  reports that he has never smoked. He quit smokeless tobacco use about 5 years ago.  His smokeless tobacco use included chew. He reports that he drinks about 6.0 standard drinks of alcohol per week. He reports that he does not use drugs.  Review of Systems:   HYPERLIPIDEMIA: The lipid  abnormality consists of elevated LDL which is well controlled with 5 mg Crestor   Lab Results  Component Value Date   CHOL 160 04/24/2018   HDL 47.80 04/24/2018   LDLCALC 94 04/24/2018   TRIG 94.0 04/24/2018   CHOLHDL 3 04/24/2018    Last colonoscopy was in 07/2016 Has not done Hemoccult  No results found for: OCCULTBLD   ROS       Objective:    Physical Exam  BP 110/72   Pulse (!) 57   Ht 5' 7.5" (1.715 m)   Wt 158 lb (71.7 kg)   SpO2 97%   BMI 24.38 kg/m     There are no preventive care reminders to display for this patient.    Lab Results  Component Value Date   WBC 55.8 (HH) 10/27/2018   HGB 11.9 (L) 10/27/2018   HCT 37.9 (L) 10/27/2018   MCV 97.7 10/27/2018   PLT 209 10/27/2018   Lab Results  Component Value Date   NA 138 11/02/2018   K 3.9 11/02/2018   CHLORIDE 109 08/06/2017   CO2 30 11/02/2018   GLUCOSE 340 (H) 11/02/2018   BUN 23 11/02/2018   CREATININE 1.37 11/02/2018   BILITOT 0.4 10/27/2018   ALKPHOS 43 10/27/2018   AST 18 10/27/2018   ALT 15 10/27/2018   PROT 6.2 (  L) 10/27/2018   ALBUMIN 3.7 10/27/2018   CALCIUM 8.9 11/02/2018   ANIONGAP 6 10/27/2018   EGFR 56 (L) 08/06/2017   GFR 53.85 (L) 11/02/2018        Assessment & Plan:   Problem List Items Addressed This Visit    Essential hypertension   Pure hypercholesterolemia   Relevant Orders   Lipid panel    Other Visit Diagnoses    Uncontrolled type 2 diabetes mellitus with hyperglycemia, with long-term current use of insulin ([place])    -  Primary   Relevant Orders   Hemoglobin A1c   Comprehensive metabolic panel   Lipid panel       No orders of the defined types were placed in this encounter.    Elayne Snare, MD ASSESSMENT/ PLAN:   Diabetes type 2, insulin-dependent and nonobese  See history of present illness for detailed discussion of his current management, blood sugar patterns and problems identified  His A1c is improved at 7.1 compared to 7.7  This may  be partly because of improving diet and better postprandial readings after evening meal which is his main meal Day-to-day management of his diabetes, insulin, monitoring, balanced meals and blood sugar targets was discussed in detail  He is not taking enough insulin for lunch usually even with eating 2 slices of bread Also most likely needs to cut back on the suppertime dose when he is eating less carbohydrate to avoid hypoglycemia which has occurred occasionally  Also explained to him that without having consistent fasting readings before eating he cannot adjust his Tyler Aas Still likely benefiting from [place] which he has been able to get without excessive expense  Recommended the following today  To check blood sugar in the morning before eating  May need to adjust Tresiba based on fasting blood sugar pattern  Increase lunchtime dose at least to 3 or 4 units  Sure he takes his NovoLog before eating consistently even in the evening  To call if he is having excessive hypoglycemia     Hyperlipidemia: Adequately controlled on 5 mg Crestor as of 5/19  HYPERTENSION: He monitors at home also Well controlled and will stay on the same regimen   Sleep apnea, previously treated with CPAP and needs to start back on treatment He was advised to call his supplier and have the prescription order faxed to [place]   Preventive care:    Stool Hemoccult to be done which he has not sent back  Counseling time on subjects discussed in assessment and plan sections is over 50% of today's 25 minute visit   Patient Instructions  Check blood sugars on waking up 2-3 days a week  Also check blood sugars about 2 hours after meals and do this after different meals by rotation  Recommended blood sugar levels on waking up are 90-130 and about 2 hours after meal is 130-160  Please bring your blood sugar monitor to each visit, thank you  Take 3 U at lunch  Need stool test  Check on CPAP    Elayne Snare 11/06/2018, 11:42 AM

## 2018-11-05 NOTE — Patient Instructions (Addendum)
Check blood sugars on waking up 2-3 days a week  Also check blood sugars about 2 hours after meals and do this after different meals by rotation  Recommended blood sugar levels on waking up are 90-130 and about 2 hours after meal is 130-160  Please bring your blood sugar monitor to each visit, thank you  Take 3 U at lunch  Need stool test  Check on CPAP

## 2018-11-21 DIAGNOSIS — R69 Illness, unspecified: Secondary | ICD-10-CM | POA: Diagnosis not present

## 2018-12-14 ENCOUNTER — Other Ambulatory Visit: Payer: Self-pay

## 2018-12-14 ENCOUNTER — Telehealth: Payer: Self-pay | Admitting: Endocrinology

## 2018-12-14 NOTE — Telephone Encounter (Signed)
He can have 1 pen if he is running out soon

## 2018-12-14 NOTE — Telephone Encounter (Signed)
TRESIBA FLEXTOUCH 100 UNIT/ML SOPN FlexTouch Pen    Patient would like to see if they could pick up a sample of his medication. They stated he has about 2 left but is not quite time for him to have this filled.   Okay to leave message on VM.

## 2018-12-14 NOTE — Telephone Encounter (Signed)
Called pt and left voicemail requesting a call back. °

## 2018-12-14 NOTE — Telephone Encounter (Signed)
Ok to give pt a sample?

## 2018-12-14 NOTE — Telephone Encounter (Signed)
He is using a pen every 3 weeks or so.  Which means he has about 6 weeks at least until his next refill

## 2018-12-14 NOTE — Telephone Encounter (Signed)
Can he have samples?

## 2018-12-23 ENCOUNTER — Other Ambulatory Visit: Payer: Self-pay

## 2018-12-23 ENCOUNTER — Telehealth: Payer: Self-pay | Admitting: Endocrinology

## 2018-12-23 DIAGNOSIS — R69 Illness, unspecified: Secondary | ICD-10-CM | POA: Diagnosis not present

## 2018-12-23 MED ORDER — INSULIN DEGLUDEC 100 UNIT/ML ~~LOC~~ SOPN: PEN_INJECTOR | SUBCUTANEOUS | 2 refills | Status: DC

## 2018-12-23 NOTE — Telephone Encounter (Signed)
MEDICATION: Novalog Flex Pen AND TRESIBA FLEXTOUCH 100 UNIT/ML SOPN FlexTouch Pen  PHARMACY:  [facility] in [place] : Yes (does not want 30 day supply anymore)  IS PATIENT OUT OF MEDICATION: Yes  IF NOT; HOW MUCH IS LEFT: None  LAST APPOINTMENT DATE: @12 /30/2019  NEXT APPOINTMENT DATE:@2 /24/2020  DO WE HAVE YOUR PERMISSION TO LEAVE A DETAILED MESSAGE: Yes  OTHER COMMENTS:    **Let patient know to contact pharmacy at the end of the day to make sure medication is ready. **  ** Please notify patient to allow 48-72 hours to process**  **Encourage patient to contact the pharmacy for refills or they can request refills through [facility]**

## 2018-12-23 NOTE — Telephone Encounter (Signed)
Rx sent 

## 2018-12-25 ENCOUNTER — Other Ambulatory Visit: Payer: Self-pay

## 2018-12-25 ENCOUNTER — Telehealth: Payer: Self-pay | Admitting: Endocrinology

## 2018-12-25 MED ORDER — INSULIN DEGLUDEC 100 UNIT/ML ~~LOC~~ SOPN: PEN_INJECTOR | SUBCUTANEOUS | 4 refills | Status: DC

## 2018-12-25 MED ORDER — INSULIN LISPRO (1 UNIT DIAL) 100 UNIT/ML (KWIKPEN): PEN_INJECTOR | SUBCUTANEOUS | 4 refills | Status: DC

## 2018-12-25 NOTE — Telephone Encounter (Signed)
Rx corrected. Pt's wife requested that new rx go to a different pharmacy. This was done.

## 2018-12-25 NOTE — Telephone Encounter (Signed)
Patients wife would like a call back. She said she is having a hard time getting the patients prescriptions filled. That the wrong amount keeps getting sent   Please advise

## 2018-12-29 ENCOUNTER — Telehealth: Payer: Self-pay | Admitting: Endocrinology

## 2018-12-29 ENCOUNTER — Other Ambulatory Visit: Payer: Self-pay

## 2018-12-29 MED ORDER — INSULIN LISPRO (1 UNIT DIAL) 100 UNIT/ML (KWIKPEN): PEN_INJECTOR | SUBCUTANEOUS | 4 refills | Status: DC

## 2018-12-29 NOTE — Telephone Encounter (Signed)
Rx sent 

## 2018-12-29 NOTE — Telephone Encounter (Signed)
MEDICATION: Novolog - 9 units 3 times daily  PHARMACY:  [id number] - [place], [place]  IS THIS A 90 DAY SUPPLY : Yes  IS PATIENT OUT OF MEDICATION:   IF NOT; HOW MUCH IS LEFT: a couple days  LAST APPOINTMENT DATE: @1 /09/2019  NEXT APPOINTMENT DATE:@2 /24/2020  DO WE HAVE YOUR PERMISSION TO LEAVE A DETAILED MESSAGE:  OTHER COMMENTS:  Patients wife is stating her husband no longer takes Humalog  **Let patient know to contact pharmacy at the end of the day to make sure medication is ready. **  ** Please notify patient to allow 48-72 hours to process**  **Encourage patient to contact the pharmacy for refills or they can request refills through [facility]**

## 2018-12-30 ENCOUNTER — Other Ambulatory Visit: Payer: Self-pay

## 2018-12-30 ENCOUNTER — Telehealth: Payer: Self-pay | Admitting: Endocrinology

## 2018-12-30 MED ORDER — INSULIN ASPART 100 UNIT/ML FLEXPEN: 9.0000 [IU] | PEN_INJECTOR | Freq: Three times a day (TID) | SUBCUTANEOUS | 11 refills | Status: DC

## 2018-12-30 MED ORDER — INSULIN ASPART 100 UNIT/ML FLEXPEN: 9.0000 [IU] | PEN_INJECTOR | Freq: Three times a day (TID) | SUBCUTANEOUS | 4 refills | Status: DC

## 2018-12-30 NOTE — Telephone Encounter (Signed)
Rx sent.  Pt called and informed. Pt stated that he has not taken Humalog since 2005. He has been taking Novolog since 2005.

## 2018-12-30 NOTE — Telephone Encounter (Signed)
Pharmacy called patients insurance does not cover humalog but it does cover novalog. Please Advise, thanks

## 2018-12-31 ENCOUNTER — Other Ambulatory Visit: Payer: Self-pay | Admitting: Endocrinology

## 2019-01-19 ENCOUNTER — Encounter: Payer: Self-pay | Admitting: Pulmonary Disease

## 2019-01-19 ENCOUNTER — Ambulatory Visit (INDEPENDENT_AMBULATORY_CARE_PROVIDER_SITE_OTHER): Payer: Medicare HMO | Admitting: Pulmonary Disease

## 2019-01-19 VITALS — BP 134/78 | HR 70 | Ht 67.0 in | Wt 158.0 lb

## 2019-01-19 DIAGNOSIS — G4733 Obstructive sleep apnea (adult) (pediatric): Secondary | ICD-10-CM

## 2019-01-19 NOTE — Patient Instructions (Signed)
History of obstructive sleep apnea  We will set you up with a home sleep study I will see you back in the office in about 3 months  Call with significant concerns Sleep Apnea Sleep apnea is a condition in which breathing pauses or becomes shallow during sleep. Episodes of sleep apnea usually last 10 seconds or longer, and they may occur as many as 20 times an hour. Sleep apnea disrupts your sleep and keeps your body from getting the rest that it needs. This condition can increase your risk of certain health problems, including:  Heart attack.  Stroke.  Obesity.  Diabetes.  Heart failure.  Irregular heartbeat. There are three kinds of sleep apnea:  Obstructive sleep apnea. This kind is caused by a blocked or collapsed airway.  Central sleep apnea. This kind happens when the part of the brain that controls breathing does not send the correct signals to the muscles that control breathing.  Mixed sleep apnea. This is a combination of obstructive and central sleep apnea. What are the causes? The most common cause of this condition is a collapsed or blocked airway. An airway can collapse or become blocked if:  Your throat muscles are abnormally relaxed.  Your tongue and tonsils are larger than normal.  You are overweight.  Your airway is smaller than normal. What increases the risk? This condition is more likely to develop in people who:  Are overweight.  Smoke.  Have a smaller than normal airway.  Are elderly.  Are male.  Drink alcohol.  Take sedatives or tranquilizers.  Have a family history of sleep apnea. What are the signs or symptoms? Symptoms of this condition include:  Trouble staying asleep.  Daytime sleepiness and tiredness.  Irritability.  Loud snoring.  Morning headaches.  Trouble concentrating.  Forgetfulness.  Decreased interest in sex.  Unexplained sleepiness.  Mood swings.  Personality changes.  Feelings of  depression.  Waking up often during the night to urinate.  Dry mouth.  Sore throat. How is this diagnosed? This condition may be diagnosed with:  A medical history.  A physical exam.  A series of tests that are done while you are sleeping (sleep study). These tests are usually done in a sleep lab, but they may also be done at home. How is this treated? Treatment for this condition aims to restore normal breathing and to ease symptoms during sleep. It may involve managing health issues that can affect breathing, such as high blood pressure or obesity. Treatment may include:  Sleeping on your side.  Using a decongestant if you have nasal congestion.  Avoiding the use of depressants, including alcohol, sedatives, and narcotics.  Losing weight if you are overweight.  Making changes to your diet.  Quitting smoking.  Using a device to open your airway while you sleep, such as: ? An oral appliance. This is a custom-made mouthpiece that shifts your lower jaw forward. ? A continuous positive airway pressure (CPAP) device. This device delivers oxygen to your airway through a mask. ? A nasal expiratory positive airway pressure (EPAP) device. This device has valves that you put into each nostril. ? A bi-level positive airway pressure (BPAP) device. This device delivers oxygen to your airway through a mask.  Surgery if other treatments do not work. During surgery, excess tissue is removed to create a wider airway. It is important to get treatment for sleep apnea. Without treatment, this condition can lead to:  High blood pressure.  Coronary artery disease.  (Men) An  inability to achieve or maintain an erection (impotence).  Reduced thinking abilities. Follow these instructions at home:  Make any lifestyle changes that your health care provider recommends.  Eat a healthy, well-balanced diet.  Take over-the-counter and prescription medicines only as told by your health care  provider.  Avoid using depressants, including alcohol, sedatives, and narcotics.  Take steps to lose weight if you are overweight.  If you were given a device to open your airway while you sleep, use it only as told by your health care provider.  Do not use any tobacco products, such as cigarettes, chewing tobacco, and e-cigarettes. If you need help quitting, ask your health care provider.  Keep all follow-up visits as told by your health care provider. This is important. Contact a health care provider if:  The device that you received to open your airway during sleep is uncomfortable or does not seem to be working.  Your symptoms do not improve.  Your symptoms get worse. Get help right away if:  You develop chest pain.  You develop shortness of breath.  You develop discomfort in your back, arms, or stomach.  You have trouble speaking.  You have weakness on one side of your body.  You have drooping in your face. These symptoms may represent a serious problem that is an emergency. Do not wait to see if the symptoms will go away. Get medical help right away. Call your local emergency services (911 in the [place] Do not drive yourself to the hospital. This information is not intended to replace advice given to you by your health care provider. Make sure you discuss any questions you have with your health care provider. Document Released: 11/22/2002 Document Revised: 06/30/2017 Document Reviewed: 09/11/2015 Elsevier Interactive Patient Education  2019 [employer].

## 2019-01-19 NOTE — Progress Notes (Signed)
Tony Marshall    [id number]    [birth year]  Primary Care Physician:Kumar, Vicenta Aly, MD  Referring Physician: Elayne Snare, MD [address] [place], [place] [address]  Chief complaint:   Patient with a history of obstructive sleep apnea Nonrestorative sleep  HPI:  Was diagnosed with obstructive sleep apnea about 10 years ago Used CPAP about 7 to 8 years Discontinued use of a CPAP because he was having mask issues  He is now having symptoms of daytime sleepiness, nonrestorative sleep  Witnessed apneas Significant snoring  Spouse states that it was much better when he was using CPAP Will seldom take daytime naps  Usually tries to go to bed at 10 Wakes up between 8 and 9 AM No dryness of his mouth No headaches Occasional shoulder discomfort wakes him up   Outpatient Encounter Medications as of 01/19/2019  Medication Sig  . amLODipine (NORVASC) 10 MG tablet Take 0.5 tablets (5 mg total) by mouth daily.  Marland Kitchen aspirin 81 MG EC tablet Take 81 mg by mouth daily. Swallow whole.  . benazepril (LOTENSIN) 20 MG tablet TAKE ONE TABLET BY MOUTH ONE TIME DAILY   . canagliflozin (INVOKANA) 100 MG TABS tablet TAKE 1 TABLET BY MOUTH DAILY BEFORE BREAKFAST  . carvedilol (COREG) 25 MG tablet Take 1 tablet (25 mg total) by mouth 2 (two) times daily.  Marland Kitchen doxazosin (CARDURA) 4 MG tablet TAKE ONE TABLET BY MOUTH AT BEDTIME   . DUREZOL 0.05 % EMUL Place 1 drop into the right eye 2 (two) times daily.  Marland Kitchen esomeprazole (NEXIUM) 40 MG capsule Take 40 mg by mouth daily at 12 noon.  Marland Kitchen glucagon (GLUCAGON EMERGENCY) 1 MG injection Inject 1 mg into the vein once as needed.  Marland Kitchen glucose blood (ONETOUCH VERIO) test strip USE 1 STRIP TO CHECK GLUCOSE THREE TIMES DAILY  . insulin aspart (NOVOLOG FLEXPEN) 100 UNIT/ML FlexPen Inject 9 Units into the skin 3 (three) times daily with meals. INJECT 9 UNITS UNDER THE SKIN THREE TIMES DAILY WITH MEALS.  Marland Kitchen insulin degludec (TRESIBA FLEXTOUCH) 100 UNIT/ML  SOPN FlexTouch Pen inject 12 units daily. if fasting blood sugars are over 140 increase dosage up to 15 units daily as directed.  . pioglitazone (ACTOS) 15 MG tablet TAKE ONE TABLET BY MOUTH ONE TIME DAILY   . rosuvastatin (CRESTOR) 5 MG tablet Take 1 tablet (5 mg total) by mouth every morning.  . saccharomyces boulardii (FLORASTOR) 250 MG capsule Take 250 mg by mouth 2 (two) times daily.  . sildenafil (REVATIO) 20 MG tablet TAKE 5 TABLETS BY MOUTH AS NEEDED once a week.  . zolpidem (AMBIEN CR) 6.25 MG CR tablet Take 1 tablet (6.25 mg total) by mouth at bedtime as needed. for sleep  . [DISCONTINUED] pioglitazone (ACTOS) 15 MG tablet take 1 tablet by mouth once daily   No facility-administered encounter medications on file as of 01/19/2019.     Allergies as of 01/19/2019 - Review Complete 01/19/2019  Allergen Reaction Noted  . Glycopyrrolate Rash 10/25/2013    Past Medical History:  Diagnosis Date  . CLL (chronic lymphocytic leukemia) ([place])   . Diabetes mellitus without complication ([place])   . GERD (gastroesophageal reflux disease)   . Hyperlipidemia   . Hypertension   . Lymphocytosis   . Sleep apnea     Past Surgical History:  Procedure Laterality Date  . BLEPHAROPLASTY    . COLONOSCOPY  2012  . HEMORRHOID SURGERY  Sclerotherapy  . KNEE SURGERY Right 2002  . POLYPECTOMY    . SHOULDER SURGERY Right 2007    Family History  Problem Relation Age of Onset  . Kidney disease Mother   . CAD Maternal Aunt   . Colon cancer Neg Hx   . Esophageal cancer Neg Hx   . Rectal cancer Neg Hx   . Stomach cancer Neg Hx     Social History   Socioeconomic History  . Marital status: Married    Spouse name: Not on file  . Number of children: 1  . Years of education: Not on file  . Highest education level: Not on file  Occupational History  . Occupation: Retail buyer: [employer]  . Financial resource strain: Not on file  . Food insecurity:      Worry: Not on file    Inability: Not on file  . Transportation needs:    Medical: Not on file    Non-medical: Not on file  Tobacco Use  . Smoking status: Never Smoker  . Smokeless tobacco: Former Systems developer    Types: Chew  Substance and Sexual Activity  . Alcohol use: Yes    Alcohol/week: 6.0 standard drinks    Types: 6 Cans of beer per week  . Drug use: No  . Sexual activity: Yes  Lifestyle  . Physical activity:    Days per week: Not on file    Minutes per session: Not on file  . Stress: Not on file  Relationships  . Social connections:    Talks on phone: Not on file    Gets together: Not on file    Attends religious service: Not on file    Active member of club or organization: Not on file    Attends meetings of clubs or organizations: Not on file    Relationship status: Not on file  . Intimate partner violence:    Fear of current or ex partner: Not on file    Emotionally abused: Not on file    Physically abused: Not on file    Forced sexual activity: Not on file  Other Topics Concern  . Not on file  Social History Narrative  . Not on file    Review of Systems  Constitutional: Negative.   HENT: Negative.   Respiratory: Positive for apnea.   Cardiovascular: Negative.   Gastrointestinal: Negative.   Psychiatric/Behavioral: Positive for sleep disturbance.    Vitals:   01/19/19 1106  BP: 134/78  Pulse: 70  SpO2: 97%     Physical Exam  Constitutional: He appears well-developed and well-nourished.  HENT:  Head: Normocephalic and atraumatic.  Mallampati 4, crowded oropharynx  Eyes: Pupils are equal, round, and reactive to light. Conjunctivae are normal. Right eye exhibits no discharge. Left eye exhibits no discharge.  Neck: Normal range of motion. Neck supple. No tracheal deviation present. No thyromegaly present.  Cardiovascular: Normal rate and regular rhythm.  Pulmonary/Chest: Effort normal and breath sounds normal.  Abdominal: Soft. Bowel sounds are  normal. He exhibits no distension.    Results of the Epworth flowsheet 01/19/2019  Sitting and reading 0  Watching TV 2  Sitting, inactive in a public place (e.g. a theatre or a meeting) 0  As a passenger in a car for an hour without a break 0  Lying down to rest in the afternoon when circumstances permit 3  Sitting and talking to someone 0  Sitting quietly after a lunch  without alcohol 1  In a car, while stopped for a few minutes in traffic 0  Total score 6   Data Reviewed: Records reviewed  Assessment:  History of obstructive sleep apnea -High probability of significant obstructive sleep apnea  Nonrestorative sleep  Witnessed apneas  Plan/Recommendations: Pathophysiology of sleep disordered breathing discussed with the patient Treatment options discussed with the patient  We will set him up with a home sleep study  I will see him back in the office in about 3 months Encouraged to call if any significant concerns   Sherrilyn Rist MD [facility] 01/19/2019, 11:26 AM  CC: Elayne Snare, MD

## 2019-01-21 ENCOUNTER — Other Ambulatory Visit: Payer: Self-pay | Admitting: Endocrinology

## 2019-01-25 ENCOUNTER — Other Ambulatory Visit: Payer: Medicare HMO

## 2019-01-25 ENCOUNTER — Other Ambulatory Visit (INDEPENDENT_AMBULATORY_CARE_PROVIDER_SITE_OTHER): Payer: Medicare HMO

## 2019-01-25 DIAGNOSIS — Z1211 Encounter for screening for malignant neoplasm of colon: Secondary | ICD-10-CM | POA: Diagnosis not present

## 2019-01-25 LAB — FECAL OCCULT BLOOD, IMMUNOCHEMICAL: Fecal Occult Bld: POSITIVE — AB

## 2019-01-26 ENCOUNTER — Other Ambulatory Visit: Payer: Self-pay

## 2019-01-26 ENCOUNTER — Telehealth: Payer: Self-pay

## 2019-01-26 NOTE — Telephone Encounter (Signed)
Main lab just called with a positive IFOB.

## 2019-01-27 DIAGNOSIS — R69 Illness, unspecified: Secondary | ICD-10-CM | POA: Diagnosis not present

## 2019-02-01 DIAGNOSIS — G4733 Obstructive sleep apnea (adult) (pediatric): Secondary | ICD-10-CM

## 2019-02-02 ENCOUNTER — Other Ambulatory Visit: Payer: Self-pay | Admitting: Endocrinology

## 2019-02-06 DIAGNOSIS — R69 Illness, unspecified: Secondary | ICD-10-CM | POA: Diagnosis not present

## 2019-02-08 ENCOUNTER — Other Ambulatory Visit (INDEPENDENT_AMBULATORY_CARE_PROVIDER_SITE_OTHER): Payer: Medicare HMO

## 2019-02-08 DIAGNOSIS — E7800 Pure hypercholesterolemia, unspecified: Secondary | ICD-10-CM

## 2019-02-08 DIAGNOSIS — E1165 Type 2 diabetes mellitus with hyperglycemia: Secondary | ICD-10-CM

## 2019-02-08 DIAGNOSIS — Z794 Long term (current) use of insulin: Secondary | ICD-10-CM

## 2019-02-08 LAB — LIPID PANEL
Cholesterol: 161 mg/dL (ref 0–200)
HDL: 43.4 mg/dL (ref >39.00)
LDL Cholesterol: 98 mg/dL (ref 0–99)
NonHDL: 117.5
Total CHOL/HDL Ratio: 4
Triglycerides: 99 mg/dL (ref 0.0–149.0)
VLDL: 19.8 mg/dL (ref 0.0–40.0)

## 2019-02-08 LAB — COMPREHENSIVE METABOLIC PANEL
ALK PHOS: 40 U/L (ref 39–117)
ALT: 16 U/L (ref 0–53)
AST: 19 U/L (ref 0–37)
Albumin: 3.9 g/dL (ref 3.5–5.2)
BUN: 26 mg/dL — ABNORMAL HIGH (ref 6–23)
CO2: 31 mEq/L (ref 19–32)
Calcium: 9.1 mg/dL (ref 8.4–10.5)
Chloride: 102 mEq/L (ref 96–112)
Creatinine, Ser: 1.3 mg/dL (ref 0.40–1.50)
GFR: 53.78 mL/min — ABNORMAL LOW (ref >60.00)
Glucose, Bld: 218 mg/dL — ABNORMAL HIGH (ref 70–99)
Potassium: 4.2 mEq/L (ref 3.5–5.1)
Sodium: 139 mEq/L (ref 135–145)
TOTAL PROTEIN: 6 g/dL (ref 6.0–8.3)
Total Bilirubin: 0.5 mg/dL (ref 0.2–1.2)

## 2019-02-08 LAB — HEMOGLOBIN A1C: Hgb A1c MFr Bld: 7.7 % — ABNORMAL HIGH (ref 4.6–6.5)

## 2019-02-09 DIAGNOSIS — G4733 Obstructive sleep apnea (adult) (pediatric): Secondary | ICD-10-CM | POA: Diagnosis not present

## 2019-02-11 ENCOUNTER — Encounter: Payer: Self-pay | Admitting: Endocrinology

## 2019-02-11 ENCOUNTER — Ambulatory Visit: Payer: Medicare HMO | Admitting: Endocrinology

## 2019-02-11 VITALS — BP 142/76 | HR 54 | Ht 67.0 in | Wt 158.8 lb

## 2019-02-11 DIAGNOSIS — I10 Essential (primary) hypertension: Secondary | ICD-10-CM | POA: Diagnosis not present

## 2019-02-11 DIAGNOSIS — E1165 Type 2 diabetes mellitus with hyperglycemia: Secondary | ICD-10-CM | POA: Diagnosis not present

## 2019-02-11 DIAGNOSIS — E7800 Pure hypercholesterolemia, unspecified: Secondary | ICD-10-CM | POA: Diagnosis not present

## 2019-02-11 DIAGNOSIS — Z794 Long term (current) use of insulin: Secondary | ICD-10-CM

## 2019-02-11 DIAGNOSIS — D649 Anemia, unspecified: Secondary | ICD-10-CM | POA: Diagnosis not present

## 2019-02-11 NOTE — Progress Notes (Signed)
Patient ID: Tony Marshall, male   DOB: December 24, 1940, 78 y.o.   MRN: [id number]   Reason for Appointment:  follow-up of various conditions  History of Present Illness   1. Type 2 DIABETES MELITUS, date of diagnosis:  1992        His blood sugar has been  difficult to control over the last few years because of variability in his blood sugars On basal bolus insulin for a few years and his oral hypoglycemic drugs have been continued Also did not tolerate GLP-1 drugs or symlin because of nausea He is essentially looking like a a type I patient with requiring relatively low amounts of insulin and twice a day dosage of basal insulin He has done somewhat better with Levemir compared to Lantus and is using this twice a day also  He has also had overall much better controlled with adding Invokana in 07/2016  Recent history:  Insulin regimen: insulin TRESIBA 12 units am.  Mealtime insulin: Breakfast = 2-3 units, lunch 2-3 units supper 5-6 units based on meal size  Oral hypoglycemic drugs: Actos, Invokana  100 mg  His A1c is again back up to 7.7 and was better at 7.1 in November  Recent blood sugar patterns and problems:  As before he forgets to check his sugars much and not clear if he has consistent pattern  His main difficulty is adequately adjusting his mealtime dose based on what he is eating  This has caused a couple of significantly low sugars, once around 6 PM and once at 9 PM  He has only 3 or 4 readings in the mornings and except for this morning that has been fairly good  As before he usually does not take enough insulin for his lunch even when he is eating more carbohydrates such as pancakes and his lab glucose was over 200 after such a meal  Also has a few readings over 200 after evening meal  With his activity level being relatively low during winter he may be having high readings also  No overnight hypoglycemia  His weight is about the same   Hypoglycemia:   as above        Side effects from medications: None       Proper timing of medications in relation to meals: Yes.           Has Bfst at 9-10 am, lunch 12-1 PM dinner at 7-8 PM   Monitors blood glucose: QD    Glucometer: One Touch.           Blood Glucose readings from one touch zoom software download:   PRE-MEAL Fasting Lunch  6 PM Bedtime Overall  Glucose range:  107-193   49, 113    Mean/median:  131     0.57   POST-MEAL PC Breakfast PC Lunch PC Dinner  Glucose range:   168  55-259  Mean/median:      PREVIOUS readings:   PRE-MEAL  9-11 AM Lunch Dinner Bedtime Overall  Glucose range:  100-199      Mean/median:  143     124   POST-MEAL PC Breakfast PC Lunch PC Dinner  Glucose range:   107-304  54-182  Mean/median:   220  122      Physical activity: exercise: Mostly working around [employer] Readings from Last 3 Encounters:  02/11/19 158 lb 12.8 oz (72 kg)  01/19/19 158 lb (  71.7 kg)  11/05/18 158 lb (71.7 kg)          LABS:  Lab Results  Component Value Date   HGBA1C 7.7 (H) 02/08/2019   HGBA1C 7.1 (H) 11/02/2018   HGBA1C 7.7 (H) 07/27/2018   Lab Results  Component Value Date   MICROALBUR 1.0 04/24/2018   LDLCALC 98 02/08/2019   CREATININE 1.30 02/08/2019    2. Hypertension:    His blood pressure has been high for over 30 years and previously difficult to control. He was evaluated in 1/08 with urine metanephrines and renal artery ultrasound.    Currently on multiple medications including amlodipine, benazepril, carvedilol 25 mg and doxazosin 4 mg Blood pressure at home usually 903-833 systolic      Lab on 38/32/9191  Component Date Value Ref Range Status  . Cholesterol 02/08/2019 161  0 - 200 mg/dL Final   ATP III Classification       Desirable:  < 200 mg/dL               Borderline High:  200 - 239 mg/dL          High:  > = 240 mg/dL  . Triglycerides 02/08/2019 99.0  0.0 - 149.0 mg/dL Final   Normal:  <150 mg/dLBorderline High:  150 - 199  mg/dL  . HDL 02/08/2019 43.40  >39.00 mg/dL Final  . VLDL 02/08/2019 19.8  0.0 - 40.0 mg/dL Final  . LDL Cholesterol 02/08/2019 98  0 - 99 mg/dL Final  . Total CHOL/HDL Ratio 02/08/2019 4   Final                  Men          Women1/2 Average Risk     3.4          3.3Average Risk          5.0          4.42X Average Risk          9.6          7.13X Average Risk          15.0          11.0                      . NonHDL 02/08/2019 117.50   Final   NOTE:  Non-HDL goal should be 30 mg/dL higher than patient's LDL goal (i.e. LDL goal of < 70 mg/dL, would have non-HDL goal of < 100 mg/dL)  . Sodium 02/08/2019 139  135 - 145 mEq/L Final  . Potassium 02/08/2019 4.2  3.5 - 5.1 mEq/L Final  . Chloride 02/08/2019 102  96 - 112 mEq/L Final  . CO2 02/08/2019 31  19 - 32 mEq/L Final  . Glucose, Bld 02/08/2019 218* 70 - 99 mg/dL Final  . BUN 02/08/2019 26* 6 - 23 mg/dL Final  . Creatinine, Ser 02/08/2019 1.30  0.40 - 1.50 mg/dL Final  . Total Bilirubin 02/08/2019 0.5  0.2 - 1.2 mg/dL Final  . Alkaline Phosphatase 02/08/2019 40  39 - 117 U/L Final  . AST 02/08/2019 19  0 - 37 U/L Final  . ALT 02/08/2019 16  0 - 53 U/L Final  . Total Protein 02/08/2019 6.0  6.0 - 8.3 g/dL Final  . Albumin 02/08/2019 3.9  3.5 - 5.2 g/dL Final  . Calcium 02/08/2019 9.1  8.4 - 10.5 mg/dL Final  . GFR 02/08/2019 53.78* >60.00  mL/min Final  . Hgb A1c MFr Bld 02/08/2019 7.7* 4.6 - 6.5 % Final   Glycemic Control Guidelines for People with Diabetes:Non Diabetic:  <6%Goal of Therapy: <7%Additional Action Suggested:  >8%     Allergies as of 02/11/2019      Reactions   Glycopyrrolate Rash      Medication List       Accurate as of February 11, 2019  1:51 PM. Always use your most recent med list.        amLODipine 10 MG tablet Commonly known as:  NORVASC Take 0.5 tablets (5 mg total) by mouth daily.   aspirin 81 MG EC tablet Take 81 mg by mouth daily. Swallow whole.   benazepril 20 MG tablet Commonly known as:   LOTENSIN TAKE ONE TABLET BY MOUTH ONE TIME DAILY   canagliflozin 100 MG Tabs tablet Commonly known as:  INVOKANA TAKE 1 TABLET BY MOUTH DAILY BEFORE BREAKFAST   carvedilol 25 MG tablet Commonly known as:  COREG TAKE ONE TABLET BY MOUTH TWICE DAILY   doxazosin 4 MG tablet Commonly known as:  CARDURA TAKE ONE TABLE BY MOUTH AT BEDTIME   DUREZOL 0.05 % Emul Generic drug:  Difluprednate Place 1 drop into the right eye 2 (two) times daily.   esomeprazole 40 MG capsule Commonly known as:  NEXIUM Take 40 mg by mouth daily at 12 noon.   FLORASTOR 250 MG capsule Generic drug:  saccharomyces boulardii Take 250 mg by mouth 2 (two) times daily.   glucagon 1 MG injection Commonly known as:  GLUCAGON EMERGENCY Inject 1 mg into the vein once as needed.   glucose blood test strip Commonly known as:  ONETOUCH VERIO USE 1 STRIP TO CHECK GLUCOSE THREE TIMES DAILY   insulin aspart 100 UNIT/ML FlexPen Commonly known as:  NOVOLOG FLEXPEN Inject 9 Units into the skin 3 (three) times daily with meals. INJECT 9 UNITS UNDER THE SKIN THREE TIMES DAILY WITH MEALS.   insulin degludec 100 UNIT/ML Sopn FlexTouch Pen Commonly known as:  TRESIBA FLEXTOUCH inject 12 units daily. if fasting blood sugars are over 140 increase dosage up to 15 units daily as directed.   pioglitazone 15 MG tablet Commonly known as:  ACTOS TAKE ONE TABLET BY MOUTH ONE TIME DAILY   rosuvastatin 5 MG tablet Commonly known as:  CRESTOR TAKE 1 TABLET BY MOUTH EVERY DAY IN THE MORNING   sildenafil 20 MG tablet Commonly known as:  REVATIO TAKE 5 TABLETS BY MOUTH AS NEEDED once a week.   zolpidem 6.25 MG CR tablet Commonly known as:  AMBIEN CR Take 1 tablet (6.25 mg total) by mouth at bedtime as needed. for sleep       Allergies:  Allergies  Allergen Reactions  . Glycopyrrolate Rash    Past Medical History:  Diagnosis Date  . CLL (chronic lymphocytic leukemia) ([place])   . Diabetes mellitus without complication  ([place])   . GERD (gastroesophageal reflux disease)   . Hyperlipidemia   . Hypertension   . Lymphocytosis   . Sleep apnea     Past Surgical History:  Procedure Laterality Date  . BLEPHAROPLASTY    . COLONOSCOPY  2012  . HEMORRHOID SURGERY     Sclerotherapy  . KNEE SURGERY Right 2002  . POLYPECTOMY    . SHOULDER SURGERY Right 2007    Family History  Problem Relation Age of Onset  . Kidney disease Mother   . CAD Maternal Aunt   . Colon cancer Neg Hx   .  Esophageal cancer Neg Hx   . Rectal cancer Neg Hx   . Stomach cancer Neg Hx     Social History:  reports that he has never smoked. He quit smokeless tobacco use about 6 years ago.  His smokeless tobacco use included chew. He reports current alcohol use of about 6.0 standard drinks of alcohol per week. He reports that he does not use drugs.  Review of Systems:   HYPERLIPIDEMIA: The lipid abnormality consists of elevated LDL which is well controlled with 5 mg Crestor   Lab Results  Component Value Date   CHOL 161 02/08/2019   HDL 43.40 02/08/2019   LDLCALC 98 02/08/2019   TRIG 99.0 02/08/2019   CHOLHDL 4 02/08/2019    Last colonoscopy was in 07/2016 Has done Hemoccult which is positive, however he thinks he has hemorrhoids and is due to see his gastroenterologist next month  Lab Results  Component Value Date   OCCULTBLD Positive (A) 01/25/2019     ROS       Objective:    Physical Exam  BP (!) 142/76 (BP Location: Left Arm, Patient Position: Sitting, Cuff Size: Normal)   Pulse (!) 54   Ht _0  (1.702 m)   Wt 158 lb 12.8 oz (72 kg)   SpO2 96%   BMI 24.87 kg/m       Lab Results  Component Value Date   WBC 55.8 (HH) 10/27/2018   HGB 11.9 (L) 10/27/2018   HCT 37.9 (L) 10/27/2018   MCV 97.7 10/27/2018   PLT 209 10/27/2018   Lab Results  Component Value Date   NA 139 02/08/2019   K 4.2 02/08/2019   CHLORIDE 109 08/06/2017   CO2 31 02/08/2019   GLUCOSE 218 (H) 02/08/2019   BUN 26 (H)  02/08/2019   CREATININE 1.30 02/08/2019   BILITOT 0.5 02/08/2019   ALKPHOS 40 02/08/2019   AST 19 02/08/2019   ALT 16 02/08/2019   PROT 6.0 02/08/2019   ALBUMIN 3.9 02/08/2019   CALCIUM 9.1 02/08/2019   ANIONGAP 6 10/27/2018   EGFR 56 (L) 08/06/2017   GFR 53.78 (L) 02/08/2019     ASSESSMENT/ PLAN:   Diabetes type 2, insulin-dependent and nonobese  See history of present illness for detailed discussion of his current management, blood sugar patterns and problems identified  His A1c is higher at 7.7  He likely has postprandial hyperglycemia since his fasting readings except for today's are not consistently high  Do not monitor his blood sugars adequately Not any try to adjust his mealtime insulin based on carbohydrate intake Discussed that he probably does not take enough insulin for his lunch but without adequate monitoring he cannot adjust further  Recommended the following today  To check blood sugar 4 times a day to be able to qualify for the freestyle libre  Discussed in detail how this would be beneficial for adjusting his insulin and better control and described how it could work.  Given him patient information brochure  Also to avoid overestimating his mealtime dose if eating less carbohydrate at dinnertime  No change in [place]  Continue Invokana     Hyperlipidemia: Adequately controlled on 5 mg Crestor as of 2/20  HYPERTENSION: Well-controlled Blood pressure usually not over 140 at home   Sleep apnea, pending further evaluation and treatment   Preventive care:  Follow-up with gastroenterologist regarding Hemoccult-positive stool and mild anemia  Total visit time for evaluation and management of multiple problems and counseling =25 minutes  There  are no Patient Instructions on file for this visit.   Elayne Snare 02/11/2019, 1:51 PM

## 2019-02-11 NOTE — Patient Instructions (Signed)
Check blood sugars on waking up 3-4 days a week  Also check blood sugars about 2 hours after meals and do this after different meals by rotation  Recommended blood sugar levels on waking up are 90-130 and about 2 hours after meal is 130-160  Please bring your blood sugar monitor to each visit, thank you   

## 2019-02-15 ENCOUNTER — Telehealth: Payer: Self-pay | Admitting: Pulmonary Disease

## 2019-02-15 DIAGNOSIS — G4733 Obstructive sleep apnea (adult) (pediatric): Secondary | ICD-10-CM

## 2019-02-15 NOTE — Telephone Encounter (Addendum)
Dr. Ander Slade has reviewed the home sleep test this showed Mild osa.   Recommendations   Treatment options are CPAP with the settings auto 5 to 15.Due to mild osa and non-restorative sleep and daytime sleepiness.  Advise against driving while sleepy & against medication with sedative side effects.    Make appointment for 3 months for compliance with download with Dr. Ander Slade.   Spoke with patient he would like cpap therapy and apt made nothing further need

## 2019-03-02 DIAGNOSIS — G4733 Obstructive sleep apnea (adult) (pediatric): Secondary | ICD-10-CM | POA: Diagnosis not present

## 2019-03-05 ENCOUNTER — Encounter: Payer: Self-pay | Admitting: *Deleted

## 2019-03-09 ENCOUNTER — Other Ambulatory Visit: Payer: Self-pay

## 2019-03-09 ENCOUNTER — Telehealth (INDEPENDENT_AMBULATORY_CARE_PROVIDER_SITE_OTHER): Payer: Medicare HMO | Admitting: Internal Medicine

## 2019-03-09 DIAGNOSIS — K219 Gastro-esophageal reflux disease without esophagitis: Secondary | ICD-10-CM | POA: Diagnosis not present

## 2019-03-09 DIAGNOSIS — K5900 Constipation, unspecified: Secondary | ICD-10-CM | POA: Diagnosis not present

## 2019-03-09 DIAGNOSIS — R1310 Dysphagia, unspecified: Secondary | ICD-10-CM

## 2019-03-09 DIAGNOSIS — K5730 Diverticulosis of large intestine without perforation or abscess without bleeding: Secondary | ICD-10-CM

## 2019-03-09 DIAGNOSIS — R1319 Other dysphagia: Secondary | ICD-10-CM

## 2019-03-09 DIAGNOSIS — R195 Other fecal abnormalities: Secondary | ICD-10-CM | POA: Diagnosis not present

## 2019-03-09 NOTE — Progress Notes (Signed)
HPI: Webex/ virtual visit due to COVID-19 Service provided by telemedicine Location of the patient: Home Location of provider/me: My office Patient consented to telephone visit Referred by primary care, Dr. Dwyane Dee for a new problem with heme positive stool People participating in telemedicine service: Me, patient and Tony Marshall, [place] Time spent on call: 63 minutes  Tony Marshall is a 78 year old male with a history of colonic diverticulosis, adenomatous colon polyp in 2008, no polyps a colonoscopy in 2017, hemorrhoids, GERD, CLL, diabetes, hypertension, hyperlipidemia who is seen to evaluate heme positive stool.  Telephone visit today due to COVID-19.  In Nov Hgb dropped from low 13 to 11.9.  Discovered when following up with Dr. Julien Nordmann for his CLL.  Then prompted checking stool for occult blood and Hemoccult which was + in Feb 2020. Patient has hemorrhoids, will see blood with wiping if stool is hard.  No blood in stools on a regular basis or recently.  Stools darker than usual several times in the past, none recently.  No "jet black" stools.  Occasional pain above belly button.  Stools can be hard, but usually BMs 1-2x/day.  Appetite is good, but food does not taste right in the last 6-8 months.  Nexium using daily, rare heartburn or breakthrough indigestion.  Uses laxative once every couple weeks, OTC small pink tablet ([facility], probably Dulcolax).   Water will hang up with swallowing, solid foods not a problem at all.  This is been a longstanding problem he recalls having a barium esophagram to evaluate this symptom greater than 5 years ago.  Last colonoscopy was performed by me in 2017.  Severe diverticulosis.  No polyps.   Past Medical History:  Diagnosis Date  . CLL (chronic lymphocytic leukemia) ([place])   . Diabetes mellitus without complication ([place])   . Diverticulosis   . External hemorrhoids   . GERD (gastroesophageal reflux disease)   . History of colon polyps   .  Hyperlipidemia   . Hypertension   . Internal hemorrhoids   . Lymphocytosis   . Sleep apnea     Past Surgical History:  Procedure Laterality Date  . BLEPHAROPLASTY    . COLONOSCOPY  2012  . HEMORRHOID SURGERY     Sclerotherapy  . KNEE SURGERY Right 2002  . POLYPECTOMY    . SHOULDER SURGERY Right 2007    Outpatient Medications Prior to Visit  Medication Sig Dispense Refill  . amLODipine (NORVASC) 10 MG tablet Take 0.5 tablets (5 mg total) by mouth daily. 90 tablet 3  . aspirin 81 MG EC tablet Take 81 mg by mouth daily. Swallow whole.    . benazepril (LOTENSIN) 20 MG tablet TAKE ONE TABLET BY MOUTH ONE TIME DAILY  90 tablet 1  . canagliflozin (INVOKANA) 100 MG TABS tablet TAKE 1 TABLET BY MOUTH DAILY BEFORE BREAKFAST 90 tablet 3  . carvedilol (COREG) 25 MG tablet TAKE ONE TABLET BY MOUTH TWICE DAILY  180 tablet 0  . doxazosin (CARDURA) 4 MG tablet TAKE ONE TABLE BY MOUTH AT BEDTIME 90 tablet 0  . DUREZOL 0.05 % EMUL Place 1 drop into the right eye 2 (two) times daily.    Marland Kitchen esomeprazole (NEXIUM) 40 MG capsule Take 40 mg by mouth daily at 12 noon.    Marland Kitchen glucagon (GLUCAGON EMERGENCY) 1 MG injection Inject 1 mg into the vein once as needed. 1 each 5  . glucose blood (ONETOUCH VERIO) test strip USE 1 STRIP TO CHECK GLUCOSE THREE TIMES DAILY 100 each  3  . insulin aspart (NOVOLOG FLEXPEN) 100 UNIT/ML FlexPen Inject 9 Units into the skin 3 (three) times daily with meals. INJECT 9 UNITS UNDER THE SKIN THREE TIMES DAILY WITH MEALS. 15 pen 4  . insulin degludec (TRESIBA FLEXTOUCH) 100 UNIT/ML SOPN FlexTouch Pen inject 12 units daily. if fasting blood sugars are over 140 increase dosage up to 15 units daily as directed. 4 pen 4  . pioglitazone (ACTOS) 15 MG tablet TAKE ONE TABLET BY MOUTH ONE TIME DAILY  90 tablet 1  . rosuvastatin (CRESTOR) 5 MG tablet TAKE 1 TABLET BY MOUTH EVERY DAY IN THE MORNING 90 tablet 0  . saccharomyces boulardii (FLORASTOR) 250 MG capsule Take 250 mg by mouth 2 (two)  times daily.    . sildenafil (REVATIO) 20 MG tablet TAKE 5 TABLETS BY MOUTH AS NEEDED once a week. 30 tablet 2  . zolpidem (AMBIEN CR) 6.25 MG CR tablet Take 1 tablet (6.25 mg total) by mouth at bedtime as needed. for sleep 30 tablet 0   No facility-administered medications prior to visit.     Allergies  Allergen Reactions  . Glycopyrrolate Rash    Family History  Problem Relation Age of Onset  . Kidney disease Mother   . CAD Maternal Aunt   . Colon cancer Neg Hx   . Esophageal cancer Neg Hx   . Rectal cancer Neg Hx   . Stomach cancer Neg Hx     Social History   Tobacco Use  . Smoking status: Never Smoker  . Smokeless tobacco: Former Systems developer    Types: Chew  Substance Use Topics  . Alcohol use: Yes    Alcohol/week: 6.0 standard drinks    Types: 6 Cans of beer per week  . Drug use: No    ROS: As per history of present illness, otherwise negative  There were no vitals taken for this visit. No physical exam due to tele-visit  RELEVANT LABS AND IMAGING: CBC    Component Value Date/Time   WBC 55.8 (HH) 10/27/2018 1344   WBC 73.4 (HH) 04/27/2018 1307   RBC 3.88 (L) 10/27/2018 1344   HGB 11.9 (L) 10/27/2018 1344   HGB 13.2 11/05/2017 1308   HCT 37.9 (L) 10/27/2018 1344   HCT 40.8 11/05/2017 1308   PLT 209 10/27/2018 1344   PLT 205 11/05/2017 1308   MCV 97.7 10/27/2018 1344   MCV 95.8 11/05/2017 1308   MCH 30.7 10/27/2018 1344   MCHC 31.4 10/27/2018 1344   RDW 15.6 (H) 10/27/2018 1344   RDW 14.0 11/05/2017 1308   LYMPHSABS 45.2 (H) 10/27/2018 1344   LYMPHSABS 66.1 (H) 11/05/2017 1308   MONOABS 6.6 (H) 10/27/2018 1344   MONOABS 1.4 (H) 11/05/2017 1308   EOSABS 0.2 10/27/2018 1344   EOSABS 0.3 11/05/2017 1308   BASOSABS 0.1 10/27/2018 1344   BASOSABS 0.2 (H) 11/05/2017 1308    CMP     Component Value Date/Time   NA 139 02/08/2019 1324   NA 141 08/06/2017 1306   K 4.2 02/08/2019 1324   K 4.3 08/06/2017 1306   CL 102 02/08/2019 1324   CO2 31 02/08/2019  1324   CO2 25 08/06/2017 1306   GLUCOSE 218 (H) 02/08/2019 1324   GLUCOSE 150 (H) 08/06/2017 1306   BUN 26 (H) 02/08/2019 1324   BUN 22.9 08/06/2017 1306   CREATININE 1.30 02/08/2019 1324   CREATININE 1.38 (H) 10/27/2018 1344   CREATININE 1.3 08/06/2017 1306   CALCIUM 9.1 02/08/2019 1324   CALCIUM  9.3 08/06/2017 1306   PROT 6.0 02/08/2019 1324   PROT 6.5 08/06/2017 1306   ALBUMIN 3.9 02/08/2019 1324   ALBUMIN 3.8 08/06/2017 1306   AST 19 02/08/2019 1324   AST 18 10/27/2018 1344   AST 20 08/06/2017 1306   ALT 16 02/08/2019 1324   ALT 15 10/27/2018 1344   ALT 19 08/06/2017 1306   ALKPHOS 40 02/08/2019 1324   ALKPHOS 47 08/06/2017 1306   BILITOT 0.5 02/08/2019 1324   BILITOT 0.4 10/27/2018 1344   BILITOT 0.46 08/06/2017 1306   GFRNONAA 49 (L) 10/27/2018 1344   GFRAA 57 (L) 10/27/2018 1344    ASSESSMENT/PLAN:   1 GERD --continue Nexium  2.  Heme positive stool --repeat stool card, we will mail to him.  Come in for CBC, IBC panel and ferritin.  He will do this in a couple of weeks when it is more safe to move around in light of COVID-19.  Based on these results we would consider upper endoscopy, possibly repeat colonoscopy, or capsule endoscopy  3.  Severe diverticulosis, intermittent constipation, history of hemorrhoids --he is already using an over-the-counter laxative likely Dulcolax.  I suggested he try MiraLAX 17 g daily, titrate as needed  4.  Intermittent liquid dysphagia --not to solids.  Symptom dates back to 2013.  Barium swallow at that time reassuring.  Likely intermittent esophageal spasm or dysmotility given liquid only.  No solid food dysphagia or weight loss.  Monitor.   XT:GGYIR, [place], [address] [place], [place] [id number]

## 2019-03-09 NOTE — Patient Instructions (Signed)
Your provider has requested that you go to the basement level for lab work. Press "B" on the elevator. The lab is located at the first door on the left as you exit the elevator.  Please purchase the following medications over the counter and take as directed: Miralax 17 grams (1 capful) dissolved in at least 8 ounces/water juice and drink daily as needed for constipation.  We have sent the following medications to your pharmacy for you to pick up at your convenience: Nexium 40 mg daily.  It was a pleasure to speak to you today!  Dr. Hilarie Fredrickson

## 2019-03-22 ENCOUNTER — Other Ambulatory Visit: Payer: Self-pay

## 2019-03-22 ENCOUNTER — Other Ambulatory Visit: Payer: Self-pay | Admitting: Endocrinology

## 2019-03-22 ENCOUNTER — Telehealth: Payer: Self-pay | Admitting: Endocrinology

## 2019-03-22 MED ORDER — PIOGLITAZONE HCL 15 MG PO TABS: 15.0000 mg | ORAL_TABLET | Freq: Every day | ORAL | 1 refills | Status: DC

## 2019-03-22 MED ORDER — INSULIN DEGLUDEC 100 UNIT/ML ~~LOC~~ SOPN: PEN_INJECTOR | SUBCUTANEOUS | 4 refills | Status: DC

## 2019-03-22 MED ORDER — BENAZEPRIL HCL 20 MG PO TABS: 20.0000 mg | ORAL_TABLET | Freq: Every day | ORAL | 1 refills | Status: DC

## 2019-03-22 NOTE — Telephone Encounter (Signed)
MEDICATION: benazepril (LOTENSIN) 20 MG tablet  pioglitazone (ACTOS) 15 MG tablet   insulin degludec (TRESIBA FLEXTOUCH) 100 UNIT/ML SOPN FlexTouch Pen   PHARMACY:  [id number] [place], [place] - [address] AT [place]  IS THIS A 90 DAY SUPPLY : yes  IS PATIENT OUT OF MEDICATION:  no  IF NOT; HOW MUCH IS LEFT:  4 days left on all   LAST APPOINTMENT DATE: @4 /05/2019  NEXT APPOINTMENT DATE:@6 /26/2020  DO WE HAVE YOUR PERMISSION TO LEAVE A DETAILED MESSAGE:  OTHER COMMENTS:    **Let patient know to contact pharmacy at the end of the day to make sure medication is ready. **  ** Please notify patient to allow 48-72 hours to process**  **Encourage patient to contact the pharmacy for refills or they can request refills through [facility]**

## 2019-03-22 NOTE — Telephone Encounter (Signed)
rx sent

## 2019-04-02 DIAGNOSIS — G4733 Obstructive sleep apnea (adult) (pediatric): Secondary | ICD-10-CM | POA: Diagnosis not present

## 2019-04-23 ENCOUNTER — Ambulatory Visit: Payer: Medicare HMO | Admitting: Pulmonary Disease

## 2019-04-26 ENCOUNTER — Inpatient Hospital Stay: Payer: Medicare HMO | Attending: Internal Medicine | Admitting: Internal Medicine

## 2019-04-26 ENCOUNTER — Other Ambulatory Visit: Payer: Self-pay | Admitting: Endocrinology

## 2019-04-26 ENCOUNTER — Inpatient Hospital Stay: Payer: Medicare HMO

## 2019-04-26 ENCOUNTER — Other Ambulatory Visit: Payer: Self-pay

## 2019-04-26 ENCOUNTER — Encounter: Payer: Self-pay | Admitting: Internal Medicine

## 2019-04-26 ENCOUNTER — Telehealth: Payer: Self-pay | Admitting: *Deleted

## 2019-04-26 VITALS — BP 136/67 | HR 70 | Temp 98.5°F | Resp 18 | Ht 67.0 in | Wt 164.0 lb

## 2019-04-26 DIAGNOSIS — C9110 Chronic lymphocytic leukemia of B-cell type not having achieved remission: Secondary | ICD-10-CM | POA: Diagnosis not present

## 2019-04-26 DIAGNOSIS — D72820 Lymphocytosis (symptomatic): Secondary | ICD-10-CM

## 2019-04-26 LAB — CBC WITH DIFFERENTIAL (CANCER CENTER ONLY)
Abs Immature Granulocytes: 0 10*3/uL (ref 0.00–0.07)
Basophils Absolute: 0 10*3/uL (ref 0.0–0.1)
Basophils Relative: 0 %
Eosinophils Absolute: 0 10*3/uL (ref 0.0–0.5)
Eosinophils Relative: 0 %
HCT: 35.7 % — ABNORMAL LOW (ref 39.0–52.0)
Hemoglobin: 11.3 g/dL — ABNORMAL LOW (ref 13.0–17.0)
Lymphocytes Relative: 93 %
Lymphs Abs: 63.1 10*3/uL — ABNORMAL HIGH (ref 0.7–4.0)
MCH: 30.9 pg (ref 26.0–34.0)
MCHC: 31.7 g/dL (ref 30.0–36.0)
MCV: 97.5 fL (ref 80.0–100.0)
Monocytes Absolute: 0.7 10*3/uL (ref 0.1–1.0)
Monocytes Relative: 1 %
Neutro Abs: 4.1 10*3/uL (ref 1.7–17.7)
Neutrophils Relative %: 6 %
Platelet Count: 179 10*3/uL (ref 150–400)
RBC: 3.66 MIL/uL — ABNORMAL LOW (ref 4.22–5.81)
RDW: 14.7 % (ref 11.5–15.5)
WBC Count: 67.8 10*3/uL (ref 4.0–10.5)
nRBC: 0 % (ref 0.0–0.2)

## 2019-04-26 LAB — CMP (CANCER CENTER ONLY)
ALT: 20 U/L (ref 0–44)
AST: 21 U/L (ref 15–41)
Albumin: 3.6 g/dL (ref 3.5–5.0)
Alkaline Phosphatase: 47 U/L (ref 38–126)
Anion gap: 7 (ref 5–15)
BUN: 21 mg/dL (ref 8–23)
CO2: 29 mmol/L (ref 22–32)
Calcium: 9.1 mg/dL (ref 8.9–10.3)
Chloride: 105 mmol/L (ref 98–111)
Creatinine: 1.45 mg/dL — ABNORMAL HIGH (ref 0.61–1.24)
GFR, Est AFR Am: 54 mL/min — ABNORMAL LOW (ref >60)
GFR, Estimated: 47 mL/min — ABNORMAL LOW (ref >60)
Glucose, Bld: 253 mg/dL — ABNORMAL HIGH (ref 70–99)
Potassium: 4.4 mmol/L (ref 3.5–5.1)
Sodium: 141 mmol/L (ref 135–145)
Total Bilirubin: 0.5 mg/dL (ref 0.3–1.2)
Total Protein: 5.9 g/dL — ABNORMAL LOW (ref 6.5–8.1)

## 2019-04-26 LAB — LACTATE DEHYDROGENASE: LDH: 248 U/L — ABNORMAL HIGH (ref 98–192)

## 2019-04-26 NOTE — Progress Notes (Signed)
[facility] [phone]   [phone]  OFFICE PROGRESS NOTE  Elayne Snare, MD [address] [place] [address]  DIAGNOSIS: Chronic lymphocytic leukemia diagnosed in October 2014.  PRIOR THERAPY: None  CURRENT THERAPY: Observation.  INTERVAL HISTORY: Tony Marshall 78 y.o. male returns to the clinic today for 6 months follow-up visit.  The patient is feeling fine today with no concerning complaints.  He denied having any weight loss or night sweats.  He denied having any nausea, vomiting, diarrhea or constipation.  He has no headache or visual changes.  He has no chest pain, shortness of breath, cough or hemoptysis.  He denied having any palpable lymphadenopathy.  He was found recently to have positive stool for Hemoccult and he is scheduled to have a colonoscopy after improvement of the COVID19 situation.  He is here today for evaluation with repeat blood work.  MEDICAL HISTORY: Past Medical History:  Diagnosis Date  . CLL (chronic lymphocytic leukemia) ([place])   . Diabetes mellitus without complication ([place])   . Diverticulosis   . External hemorrhoids   . GERD (gastroesophageal reflux disease)   . History of colon polyps   . Hyperlipidemia   . Hypertension   . Internal hemorrhoids   . Lymphocytosis   . Sleep apnea     ALLERGIES:  is allergic to glycopyrrolate.  MEDICATIONS:  Current Outpatient Medications  Medication Sig Dispense Refill  . amLODipine (NORVASC) 5 MG tablet Take 1 tablet by mouth once daily 90 tablet 0  . aspirin 81 MG EC tablet Take 81 mg by mouth daily. Swallow whole.    . benazepril (LOTENSIN) 20 MG tablet Take 1 tablet (20 mg total) by mouth daily. 90 tablet 1  . canagliflozin (INVOKANA) 100 MG TABS tablet TAKE 1 TABLET BY MOUTH DAILY BEFORE BREAKFAST 90 tablet 3  . carvedilol (COREG) 25 MG tablet TAKE ONE TABLET BY MOUTH TWICE DAILY  180 tablet 0  . doxazosin (CARDURA) 4 MG tablet TAKE ONE TABLE BY  MOUTH AT BEDTIME 90 tablet 0  . DUREZOL 0.05 % EMUL Place 1 drop into the right eye 2 (two) times daily.    Marland Kitchen esomeprazole (NEXIUM) 40 MG capsule Take 40 mg by mouth daily at 12 noon.    Marland Kitchen glucagon (GLUCAGON EMERGENCY) 1 MG injection Inject 1 mg into the vein once as needed. 1 each 5  . glucose blood (ONETOUCH VERIO) test strip USE 1 STRIP TO CHECK GLUCOSE THREE TIMES DAILY 100 each 3  . insulin aspart (NOVOLOG FLEXPEN) 100 UNIT/ML FlexPen Inject 9 Units into the skin 3 (three) times daily with meals. INJECT 9 UNITS UNDER THE SKIN THREE TIMES DAILY WITH MEALS. 15 pen 4  . insulin degludec (TRESIBA FLEXTOUCH) 100 UNIT/ML SOPN FlexTouch Pen inject 12 units daily. if fasting blood sugars are over 140 increase dosage up to 15 units daily as directed. 4 pen 4  . pioglitazone (ACTOS) 15 MG tablet Take 1 tablet (15 mg total) by mouth daily. 90 tablet 1  . rosuvastatin (CRESTOR) 5 MG tablet TAKE 1 TABLET BY MOUTH EVERY DAY IN THE MORNING 90 tablet 0  . saccharomyces boulardii (FLORASTOR) 250 MG capsule Take 250 mg by mouth 2 (two) times daily.    . sildenafil (REVATIO) 20 MG tablet TAKE 5 TABLETS BY MOUTH AS NEEDED once a week. 30 tablet 2  . zolpidem (AMBIEN CR) 6.25 MG CR tablet Take 1 tablet (6.25 mg total) by mouth  at bedtime as needed. for sleep 30 tablet 0   No current facility-administered medications for this visit.     SURGICAL HISTORY:  Past Surgical History:  Procedure Laterality Date  . BLEPHAROPLASTY    . COLONOSCOPY  2012  . HEMORRHOID SURGERY     Sclerotherapy  . KNEE SURGERY Right 2002  . POLYPECTOMY    . SHOULDER SURGERY Right 2007    REVIEW OF SYSTEMS:  A comprehensive review of systems was negative.   PHYSICAL EXAMINATION: General appearance: alert, cooperative and no distress Head: Normocephalic, without obvious abnormality, atraumatic Neck: no adenopathy, no JVD, supple, symmetrical, trachea midline and thyroid not enlarged, symmetric, no tenderness/mass/nodules Lymph  nodes: Cervical, supraclavicular, and axillary nodes normal. Resp: clear to auscultation bilaterally Back: symmetric, no curvature. ROM normal. No CVA tenderness. Cardio: regular rate and rhythm, S1, S2 normal, no murmur, click, rub or gallop GI: soft, non-tender; bowel sounds normal; no masses,  no organomegaly Extremities: extremities normal, atraumatic, no cyanosis or edema  ECOG PERFORMANCE STATUS: 1 - Symptomatic but completely ambulatory  Blood pressure 136/67, pulse 70, temperature 98.5 F (36.9 C), temperature source Oral, resp. rate 18, height 5\' 7"  (1.702 m), weight 164 lb (74.4 kg), SpO2 98 %.  LABORATORY DATA: Lab Results  Component Value Date   WBC 67.8 (HH) 04/26/2019   HGB 11.3 (L) 04/26/2019   HCT 35.7 (L) 04/26/2019   MCV 97.5 04/26/2019   PLT 179 04/26/2019      Chemistry      Component Value Date/Time   NA 139 02/08/2019 1324   NA 141 08/06/2017 1306   K 4.2 02/08/2019 1324   K 4.3 08/06/2017 1306   CL 102 02/08/2019 1324   CO2 31 02/08/2019 1324   CO2 25 08/06/2017 1306   BUN 26 (H) 02/08/2019 1324   BUN 22.9 08/06/2017 1306   CREATININE 1.30 02/08/2019 1324   CREATININE 1.38 (H) 10/27/2018 1344   CREATININE 1.3 08/06/2017 1306      Component Value Date/Time   CALCIUM 9.1 02/08/2019 1324   CALCIUM 9.3 08/06/2017 1306   ALKPHOS 40 02/08/2019 1324   ALKPHOS 47 08/06/2017 1306   AST 19 02/08/2019 1324   AST 18 10/27/2018 1344   AST 20 08/06/2017 1306   ALT 16 02/08/2019 1324   ALT 15 10/27/2018 1344   ALT 19 08/06/2017 1306   BILITOT 0.5 02/08/2019 1324   BILITOT 0.4 10/27/2018 1344   BILITOT 0.46 08/06/2017 1306       RADIOGRAPHIC STUDIES: No results found.  ASSESSMENT AND PLAN:  This is a very pleasant 78 years old white male with chronic lymphocytic leukemia currently on observation. He continues to do well with no concerning complaints. CBC today showed white blood count of 67,800. The patient has no complaints and currently  asymptomatic. I recommended for him to continue on observation with repeat CBC, comprehensive metabolic panel and LDH in 6 months. He was advised to call immediately if he has any concerning symptoms in the interval. The patient voices understanding of current disease status and treatment options and is in agreement with the current care plan. All questions were answered. The patient knows to call the clinic with any problems, questions or concerns. We can certainly see the patient much sooner if necessary. I spent 10 minutes counseling the patient face to face. The total time spent in the appointment was 15 minutes.  Disclaimer: This note was dictated with voice recognition software. Similar sounding words can inadvertently be transcribed and  may not be corrected upon review.

## 2019-04-26 NOTE — Telephone Encounter (Signed)
Made Abelina Bachelor, RN with Dr. Julien Nordmann aware of elevated WBC count.

## 2019-04-28 ENCOUNTER — Telehealth: Payer: Self-pay | Admitting: Internal Medicine

## 2019-04-28 NOTE — Telephone Encounter (Signed)
Scheduled appt per 5/11 los - lab and f.u in 6 month - sent reminder letter in the mail with appt date and time

## 2019-04-30 ENCOUNTER — Other Ambulatory Visit (INDEPENDENT_AMBULATORY_CARE_PROVIDER_SITE_OTHER): Payer: Medicare HMO

## 2019-04-30 DIAGNOSIS — R195 Other fecal abnormalities: Secondary | ICD-10-CM

## 2019-04-30 DIAGNOSIS — H31092 Other chorioretinal scars, left eye: Secondary | ICD-10-CM | POA: Diagnosis not present

## 2019-04-30 DIAGNOSIS — E103293 Type 1 diabetes mellitus with mild nonproliferative diabetic retinopathy without macular edema, bilateral: Secondary | ICD-10-CM | POA: Diagnosis not present

## 2019-04-30 DIAGNOSIS — H43813 Vitreous degeneration, bilateral: Secondary | ICD-10-CM | POA: Diagnosis not present

## 2019-04-30 DIAGNOSIS — H35371 Puckering of macula, right eye: Secondary | ICD-10-CM | POA: Diagnosis not present

## 2019-04-30 LAB — CBC WITH DIFFERENTIAL/PLATELET
Basophils Absolute: 0.1 10*3/uL (ref 0.0–0.1)
Basophils Relative: 0.1 % (ref 0.0–3.0)
Eosinophils Absolute: 0.3 10*3/uL (ref 0.0–0.7)
Eosinophils Relative: 0.4 % (ref 0.0–5.0)
HCT: 36.3 % — ABNORMAL LOW (ref 39.0–52.0)
Hemoglobin: 11.9 g/dL — ABNORMAL LOW (ref 13.0–17.0)
Lymphocytes Relative: 93.9 % — ABNORMAL HIGH (ref 12.0–46.0)
Lymphs Abs: 64.4 10*3/uL — ABNORMAL HIGH (ref 0.7–4.0)
MCHC: 32.6 g/dL (ref 30.0–36.0)
MCV: 95.7 fl (ref 78.0–100.0)
Monocytes Absolute: 0.7 10*3/uL (ref 0.1–1.0)
Monocytes Relative: 1 % — ABNORMAL LOW (ref 3.0–12.0)
Neutro Abs: 3.2 10*3/uL (ref 1.4–7.7)
Neutrophils Relative %: 4.6 % — ABNORMAL LOW (ref 43.0–77.0)
Platelets: 203 10*3/uL (ref 150.0–400.0)
RBC: 3.8 Mil/uL — ABNORMAL LOW (ref 4.22–5.81)
RDW: 14.9 % (ref 11.5–15.5)
WBC: 68.6 10*3/uL (ref 4.0–10.5)

## 2019-04-30 LAB — IBC + FERRITIN
Ferritin: 75.6 ng/mL (ref 22.0–322.0)
Iron: 60 ug/dL (ref 42–165)
Saturation Ratios: 22.8 % (ref 20.0–50.0)
Transferrin: 188 mg/dL — ABNORMAL LOW (ref 212.0–360.0)

## 2019-05-02 DIAGNOSIS — G4733 Obstructive sleep apnea (adult) (pediatric): Secondary | ICD-10-CM | POA: Diagnosis not present

## 2019-05-04 ENCOUNTER — Other Ambulatory Visit (INDEPENDENT_AMBULATORY_CARE_PROVIDER_SITE_OTHER): Payer: Medicare HMO

## 2019-05-04 DIAGNOSIS — R195 Other fecal abnormalities: Secondary | ICD-10-CM | POA: Diagnosis not present

## 2019-05-04 LAB — HEMOCCULT SLIDES (X 3 CARDS)
Fecal Occult Blood: NEGATIVE
OCCULT 1: NEGATIVE
OCCULT 2: NEGATIVE
OCCULT 3: NEGATIVE
OCCULT 4: NEGATIVE
OCCULT 5: NEGATIVE

## 2019-05-18 ENCOUNTER — Other Ambulatory Visit: Payer: Self-pay

## 2019-05-18 ENCOUNTER — Ambulatory Visit: Payer: Medicare HMO | Admitting: Pulmonary Disease

## 2019-05-18 ENCOUNTER — Encounter: Payer: Self-pay | Admitting: Primary Care

## 2019-05-18 ENCOUNTER — Ambulatory Visit (INDEPENDENT_AMBULATORY_CARE_PROVIDER_SITE_OTHER): Payer: Medicare HMO | Admitting: Primary Care

## 2019-05-18 DIAGNOSIS — G4733 Obstructive sleep apnea (adult) (pediatric): Secondary | ICD-10-CM | POA: Diagnosis not present

## 2019-05-18 DIAGNOSIS — Z9989 Dependence on other enabling machines and devices: Secondary | ICD-10-CM | POA: Diagnosis not present

## 2019-05-18 NOTE — Progress Notes (Signed)
Virtual Visit via Telephone Note  I connected with Tony Marshall on 05/18/19 at  2:00 PM EDT by telephone and verified that I am speaking with the correct person using two identifiers.  Location: Patient: Home Provider: Office   I discussed the limitations, risks, security and privacy concerns of performing an evaluation and management service by telephone and the availability of in person appointments. I also discussed with the patient that there may be a patient responsible charge related to this service. The patient expressed understanding and agreed to proceed.  History of Present Illness: 78 year old male, never smoked. PMH significant for OSA, HTN, GERD. Patient of Dr. Ander Slade, seen for initial sleep consult on 01/19/19.   05/18/2019 Called today for OSA. He has not been very compliance with new CPAP. Last wore in April. Having a hard time wearing mask, complains of sweating and getting too hot at night while wearing mask. Also having issues with mask leaking. He successfully used CPAP for 7-8 years in the past. Believes he used a nasal mask.   Airview 90 day download Usage 40/90 days (44%); 36% >4 hours Average usage (days used) 6 hours 28 mins  Pressure 5-15cm H20 (95 %- 12.3cm H20) Air leaks 26.7 L/min  AHI 4.4   Observations/Objective:  - No shortness of breath, wheezing or cough  Assessment and Plan:  OSA - Poor compliance; patient having issues with mask heat and air leaking  - Advised patient to turn down humidification - Change pressure setting 8-13cm H20  - Aim to wear CPAP every night for at least 4+ hours  - Do not drive if experiencing excessive daytime fatigue or somnolence   Follow Up Instructions:   - Follow up in 3 weeks with download   I discussed the assessment and treatment plan with the patient. The patient was provided an opportunity to ask questions and all were answered. The patient agreed with the plan and demonstrated an understanding of the  instructions.   The patient was advised to call back or seek an in-person evaluation if the symptoms worsen or if the condition fails to improve as anticipated.  I provided 20 minutes of non-face-to-face time during this encounter.   Martyn Ehrich, NP

## 2019-05-18 NOTE — Patient Instructions (Addendum)
AIM to wear CPAP every night for at least 4+ hours   Do not drive if experiencing excessive daytime fatigue or somnolence   Change pressure setting to 8-13 cm H20  You can adjust humidification on your device up or down based on your preference   Follow up in 2-3 weeks

## 2019-05-24 DIAGNOSIS — R69 Illness, unspecified: Secondary | ICD-10-CM | POA: Diagnosis not present

## 2019-06-02 DIAGNOSIS — G4733 Obstructive sleep apnea (adult) (pediatric): Secondary | ICD-10-CM | POA: Diagnosis not present

## 2019-06-08 ENCOUNTER — Encounter: Payer: Self-pay | Admitting: Primary Care

## 2019-06-08 ENCOUNTER — Ambulatory Visit (INDEPENDENT_AMBULATORY_CARE_PROVIDER_SITE_OTHER): Payer: Medicare HMO | Admitting: Primary Care

## 2019-06-08 ENCOUNTER — Other Ambulatory Visit: Payer: Self-pay

## 2019-06-08 DIAGNOSIS — Z9989 Dependence on other enabling machines and devices: Secondary | ICD-10-CM | POA: Diagnosis not present

## 2019-06-08 DIAGNOSIS — G4733 Obstructive sleep apnea (adult) (pediatric): Secondary | ICD-10-CM | POA: Diagnosis not present

## 2019-06-08 NOTE — Progress Notes (Signed)
Virtual Visit via Telephone Note  I connected with Tony Marshall on 06/08/19 at  2:00 PM EDT by telephone and verified that I am speaking with the correct person using two identifiers.  Location: Patient: Home Provider: Office   I discussed the limitations, risks, security and privacy concerns of performing an evaluation and management service by telephone and the availability of in person appointments. I also discussed with the patient that there may be a patient responsible charge related to this service. The patient expressed understanding and agreed to proceed.   History of Present Illness: 78 year old male, never smoked. PMH significant for OSA, HTN, GERD. Patient of Dr. Ander Slade, seen for initial sleep consult on 01/19/19. HST showed mild sleep apnea, AHI 8.3. Lowest SpO2 was 81% with average 94%.   05/18/2019 Called today for OSA. He has not been very compliance with new CPAP. Last wore in April. Having a hard time wearing mask, complains of sweating and getting too hot at night while wearing mask. Also having issues with mask leaking. He successfully used CPAP for 7-8 years in the past. Believes he used a nasal mask.   06/08/2019 Patient called today for 3 week follow-up with cpap download. Prior poor compliance. Changed humidification to level 2. Changed setting on 6/2 to 8-13cm H20. Appears that patient has not used CPAP machine since changes were made. Airview 30 day download showed 0/30 days used. States that he was at the beach last week and just has it in his head that he does not want to use his cpap. He noticed no benefit when using and has been sleeping fairly well recently and waking up/snoring less.    Observations/Objective:  - No shortness of breath, wheezing or cough  Assessment and Plan:  OSA - Mild obstructive sleep apnea - Unable to tolerate CPAP, patient to return device to [place] maintaining a healthy weight, side sleeping position and consider  oral appliance  Follow Up Instructions:   6 months with Dr. Ander Slade   I discussed the assessment and treatment plan with the patient. The patient was provided an opportunity to ask questions and all were answered. The patient agreed with the plan and demonstrated an understanding of the instructions.   The patient was advised to call back or seek an in-person evaluation if the symptoms worsen or if the condition fails to improve as anticipated.  I provided 18 minutes of non-face-to-face time during this encounter.   Martyn Ehrich, NP

## 2019-06-08 NOTE — Patient Instructions (Addendum)
Mild obstructive sleep apnea on home sleep test  Unable to tolerate CPAP, please return to [facility] maintaining a healthy weight, side sleeping position and consider oral appliance  Follow up in 6 months with Dr. Ander Slade or if daytime fatigue/snoring/apnea worsens  Living With Sleep Apnea Sleep apnea is a condition in which breathing pauses or becomes shallow during sleep. Sleep apnea is most commonly caused by a collapsed or blocked airway. People with sleep apnea snore loudly and have times when they gasp and stop breathing for 10 seconds or more during sleep. This happens over and over during the night. This disrupts your sleep and keeps your body from getting the rest that it needs, which can cause tiredness and lack of energy (fatigue) during the day. The breaks in breathing also interrupt the deep sleep that you need to feel rested. Even if you do not completely wake up from the gaps in breathing, your sleep may not be restful. You may also have a headache in the morning and low energy during the day, and you may feel anxious or depressed. How can sleep apnea affect me? Sleep apnea increases your chances of extreme tiredness during the day (daytime fatigue). It can also increase your risk for health conditions, such as:  Heart attack.  Stroke.  Diabetes.  Heart failure.  Irregular heartbeat.  High blood pressure. If you have daytime fatigue as a result of sleep apnea, you may be more likely to:  Perform poorly at school or work.  Fall asleep while driving.  Have difficulty with attention.  Develop depression or anxiety.  Become severely overweight (obese).  Have sexual dysfunction. What actions can I take to manage sleep apnea? Sleep apnea treatment   If you were given a device to open your airway while you sleep, use it only as told by your health care provider. You may be given: ? An oral appliance. This is a custom-made mouthpiece that shifts your  lower jaw forward. ? A continuous positive airway pressure (CPAP) device. This device blows air through a mask when you breathe out (exhale). ? A nasal expiratory positive airway pressure (EPAP) device. This device has valves that you put into each nostril. ? A bi-level positive airway pressure (BPAP) device. This device blows air through a mask when you breathe in (inhale) and breathe out (exhale).  You may need surgery if other treatments do not work for you. Sleep habits  Go to sleep and wake up at the same time every day. This helps set your internal clock (circadian rhythm) for sleeping. ? If you stay up later than usual, such as on weekends, try to get up in the morning within 2 hours of your normal wake time.  Try to get at least 7-9 hours of sleep each night.  Stop computer, tablet, and mobile phone use a few hours before bedtime.  Do not take long naps during the day. If you nap, limit it to 30 minutes.  Have a relaxing bedtime routine. Reading or listening to music may relax you and help you sleep.  Use your bedroom only for sleep. ? Keep your television and computer out of your bedroom. ? Keep your bedroom cool, dark, and quiet. ? Use a supportive mattress and pillows.  Follow your health care provider's instructions for other changes to sleep habits. Nutrition  Do not eat heavy meals in the evening.  Do not have caffeine in the later part of the day. The effects of caffeine  can last for more than 5 hours.  Follow your health care provider's or dietitian's instructions for any diet changes. Lifestyle      Do not drink alcohol before bedtime. Alcohol can cause you to fall asleep at first, but then it can cause you to wake up in the middle of the night and have trouble getting back to sleep.  Do not use any products that contain nicotine or tobacco, such as cigarettes and e-cigarettes. If you need help quitting, ask your health care provider. Medicines  Take  over-the-counter and prescription medicines only as told by your health care provider.  Do not use over-the-counter sleep medicine. You can become dependent on this medicine, and it can make sleep apnea worse.  Do not use medicines, such as sedatives and narcotics, unless told by your health care provider. Activity  Exercise on most days, but avoid exercising in the evening. Exercising near bedtime can interfere with sleeping.  If possible, spend time outside every day. Natural light helps regulate your circadian rhythm. General information  Lose weight if you need to, and maintain a healthy weight.  Keep all follow-up visits as told by your health care provider. This is important.  If you are having surgery, make sure to tell your health care provider that you have sleep apnea. You may need to bring your device with you. Where to find more information Learn more about sleep apnea and daytime fatigue from:  American Sleep Association: sleepassociation.[place]: sleepfoundation.org  National Heart, Lung, and Blood Institute: https://www.[place].biz/ Summary  Sleep apnea can cause daytime fatigue and other serious health conditions.  Both sleep apnea and daytime fatigue can be bad for your health and well-being.  You may need to wear a device while sleeping to help keep your airway open.  If you are having surgery, make sure to tell your health care provider that you have sleep apnea. You may need to bring your device with you.  Making changes to sleep habits, diet, lifestyle, and activity can help you manage sleep apnea. This information is not intended to replace advice given to you by your health care provider. Make sure you discuss any questions you have with your health care provider. Document Released: 02/26/2018 Document Revised: 08/04/2018 Document Reviewed: 02/26/2018 Elsevier Interactive Patient Education  [employer].

## 2019-06-11 ENCOUNTER — Telehealth: Payer: Self-pay | Admitting: Endocrinology

## 2019-06-11 ENCOUNTER — Other Ambulatory Visit (INDEPENDENT_AMBULATORY_CARE_PROVIDER_SITE_OTHER): Payer: Medicare HMO

## 2019-06-11 ENCOUNTER — Other Ambulatory Visit: Payer: Self-pay

## 2019-06-11 ENCOUNTER — Telehealth: Payer: Self-pay | Admitting: Family Medicine

## 2019-06-11 DIAGNOSIS — D649 Anemia, unspecified: Secondary | ICD-10-CM

## 2019-06-11 DIAGNOSIS — E1165 Type 2 diabetes mellitus with hyperglycemia: Secondary | ICD-10-CM

## 2019-06-11 DIAGNOSIS — Z794 Long term (current) use of insulin: Secondary | ICD-10-CM | POA: Diagnosis not present

## 2019-06-11 LAB — IBC PANEL
Iron: 51 ug/dL (ref 42–165)
Saturation Ratios: 18.4 % — ABNORMAL LOW (ref 20.0–50.0)
Transferrin: 198 mg/dL — ABNORMAL LOW (ref 212.0–360.0)

## 2019-06-11 LAB — CBC
HCT: 38.7 % — ABNORMAL LOW (ref 39.0–52.0)
Hemoglobin: 12.1 g/dL — ABNORMAL LOW (ref 13.0–17.0)
MCHC: 31.2 g/dL (ref 30.0–36.0)
MCV: 99.2 fl (ref 78.0–100.0)
Platelets: 227 10*3/uL (ref 150.0–400.0)
RBC: 3.9 Mil/uL — ABNORMAL LOW (ref 4.22–5.81)
RDW: 15.2 % (ref 11.5–15.5)
WBC: 80.9 10*3/uL (ref 4.0–10.5)

## 2019-06-11 LAB — COMPREHENSIVE METABOLIC PANEL
ALT: 16 U/L (ref 0–53)
AST: 22 U/L (ref 0–37)
Albumin: 4.1 g/dL (ref 3.5–5.2)
Alkaline Phosphatase: 40 U/L (ref 39–117)
BUN: 15 mg/dL (ref 6–23)
CO2: 31 mEq/L (ref 19–32)
Calcium: 9 mg/dL (ref 8.4–10.5)
Chloride: 103 mEq/L (ref 96–112)
Creatinine, Ser: 1.19 mg/dL (ref 0.40–1.50)
GFR: 59.51 mL/min — ABNORMAL LOW (ref >60.00)
Glucose, Bld: 173 mg/dL — ABNORMAL HIGH (ref 70–99)
Potassium: 4.3 mEq/L (ref 3.5–5.1)
Sodium: 140 mEq/L (ref 135–145)
Total Bilirubin: 0.5 mg/dL (ref 0.2–1.2)
Total Protein: 6.3 g/dL (ref 6.0–8.3)

## 2019-06-11 LAB — MICROALBUMIN / CREATININE URINE RATIO
Creatinine,U: 121.9 mg/dL
Microalb Creat Ratio: 2.2 mg/g (ref 0.0–30.0)
Microalb, Ur: 2.7 mg/dL — ABNORMAL HIGH (ref 0.0–1.9)

## 2019-06-11 LAB — HEMOGLOBIN A1C: Hgb A1c MFr Bld: 7.8 % — ABNORMAL HIGH (ref 4.6–6.5)

## 2019-06-11 NOTE — Telephone Encounter (Signed)
Patient called to give his readings for his visit on Monday.  Please Advise, Thanks

## 2019-06-11 NOTE — Telephone Encounter (Signed)
FYI

## 2019-06-11 NOTE — Telephone Encounter (Signed)
This has been taken care of and Pre-rooming done. Nothing further needed.

## 2019-06-11 NOTE — Telephone Encounter (Signed)
Received called for critical result for WBC 80.  Reviewing chart, prior WBC 68s in h/o CLL and chronic lymphocytosis This is chronic finding, other cell lines stable. Will forward to PCP.

## 2019-06-14 ENCOUNTER — Other Ambulatory Visit: Payer: Self-pay | Admitting: Endocrinology

## 2019-06-14 ENCOUNTER — Ambulatory Visit (INDEPENDENT_AMBULATORY_CARE_PROVIDER_SITE_OTHER): Payer: Medicare HMO | Admitting: Endocrinology

## 2019-06-14 ENCOUNTER — Other Ambulatory Visit: Payer: Self-pay

## 2019-06-14 DIAGNOSIS — E1165 Type 2 diabetes mellitus with hyperglycemia: Secondary | ICD-10-CM

## 2019-06-14 DIAGNOSIS — Z794 Long term (current) use of insulin: Secondary | ICD-10-CM

## 2019-06-14 DIAGNOSIS — C9110 Chronic lymphocytic leukemia of B-cell type not having achieved remission: Secondary | ICD-10-CM

## 2019-06-14 DIAGNOSIS — E7800 Pure hypercholesterolemia, unspecified: Secondary | ICD-10-CM | POA: Diagnosis not present

## 2019-06-14 DIAGNOSIS — I10 Essential (primary) hypertension: Secondary | ICD-10-CM

## 2019-06-14 MED ORDER — GLUCAGON EMERGENCY 1 MG IJ KIT: 1.0000 mg | PACK | Freq: Once | INTRAMUSCULAR | 5 refills | Status: AC | PRN

## 2019-06-14 NOTE — Progress Notes (Signed)
Patient ID: Tony Marshall, male   DOB: 1941-03-20, 78 y.o.   MRN: [id number]  Today's office visit was provided via telemedicine using a telephone call to the patient Patient has been explained the limitations of evaluation and management by telemedicine and the availability of in person appointments.  The patient understood the limitations and agreed to proceed. Patient also understood that the telehealth visit is billable. . Location of the patient: Home . Location of the provider: Office Only the patient and myself were participating in the encounter  Reason for Appointment:  follow-up of various conditions  History of Present Illness   1. Type 2 DIABETES MELITUS, date of diagnosis:  1992        His blood sugar has been  difficult to control over the last few years because of variability in his blood sugars On basal bolus insulin for a few years and his oral hypoglycemic drugs have been continued Also did not tolerate GLP-1 drugs or symlin because of nausea He is essentially looking like a a type I patient with requiring relatively low amounts of insulin and twice a day dosage of basal insulin He has done somewhat better with Levemir compared to Lantus and is using this twice a day also  He has also had overall much better controlled with adding Invokana in 07/2016  Recent history:  Insulin regimen: insulin TRESIBA 12 units am.  Mealtime insulin: Breakfast = 2-3 units, lunch 2-3 units supper 5-6 units based on meal size  Oral hypoglycemic drugs: Actos, Invokana  100 mg  His A1c is again relatively high at 7.8  and was better at 7.1 in November  Recent blood sugar patterns and problems:  Blood sugars are available only for the last few days from telephone review  He has some blood sugar testing done at breakfast and dinnertime mostly in not at other times  FASTING readings are variable as below and although recently higher glucose was 123 yesterday morning  He  thinks some of his high morning readings are from eating a sweet snack late at night  Although he thinks he is only eating a late morning meal and dinnertime his blood sugar may be high before dinner also and not clear why, he does not think he has afternoon snacks  He has mostly done some yard work as activity in the mornings or late evenings and no formal exercise  He now says that he is concerned about weight loss with Invokana and has left it off for about a month but has not checked his weight again; he has no difficulty getting this since he is getting the prescription from [place]  His weight last month with oncologist was 6 pounds higher  HYPOGLYCEMIA has been occasional in about 4-5 times in the last couple of months, sometimes from overestimating his insulin at dinnertime but usually not during the night  POSTPRANDIAL readings are difficult to judge, has only 2 recent readings which were fairly good, no monitoring after breakfast   Hypoglycemia:  as above   Side effects from medications: None       Proper timing of medications in relation to meals: Yes.           Has late breakfast near 12 noon, dinner at 7-8 PM   Monitors blood glucose: QD    Glucometer: One Touch Verio.           Blood Glucose readings from telephone interview  PRE-MEAL Fasting Lunch Dinner Bedtime Overall  Glucose range:  113-184   121-208    Mean/median:     ?   POST-MEAL PC Breakfast PC Lunch PC Dinner  Glucose range:    125, 115  Mean/median:      Previous readings                                                PRE-MEAL Fasting Lunch  6 PM Bedtime Overall  Glucose range:  107-193   49, 113    Mean/median:  131    157   POST-MEAL PC Breakfast PC Lunch PC Dinner  Glucose range:   168  55-259  Mean/median:      Physical activity: exercise: Mostly working around the house    Wt Readings from Last 3 Encounters:  04/26/19 164 lb (74.4 kg)  02/11/19 158 lb 12.8 oz (72 kg)  01/19/19 158 lb  (71.7 kg)          LABS:  Lab Results  Component Value Date   HGBA1C 7.8 (H) 06/11/2019   HGBA1C 7.7 (H) 02/08/2019   HGBA1C 7.1 (H) 11/02/2018   Lab Results  Component Value Date   MICROALBUR 2.7 (H) 06/11/2019   LDLCALC 98 02/08/2019   CREATININE 1.19 06/11/2019    2. Hypertension:    His blood pressure has been high for over 30 years and previously difficult to control. He was evaluated in 1/08 with urine metanephrines and renal artery ultrasound.    Currently on multiple medications including amlodipine, benazepril, carvedilol 25 mg and doxazosin 4 mg Blood pressure at home recently 140/80  Checking randomly and not in the last 2 weeks   Other ACTIVE problems: See review of systems  LABS:  Lab on 06/11/2019  Component Date Value Ref Range Status  . Iron 06/11/2019 51  42 - 165 ug/dL Final  . Transferrin 06/11/2019 198.0* 212.0 - 360.0 mg/dL Final  . Saturation Ratios 06/11/2019 18.4* 20.0 - 50.0 % Final  . WBC 06/11/2019 80.9 Repeated and verified X2.* 4.0 - 10.5 K/uL Final  . RBC 06/11/2019 3.90* 4.22 - 5.81 Mil/uL Final  . Platelets 06/11/2019 227.0  150.0 - 400.0 K/uL Final  . Hemoglobin 06/11/2019 12.1* 13.0 - 17.0 g/dL Final  . HCT 06/11/2019 38.7* 39.0 - 52.0 % Final  . MCV 06/11/2019 99.2  78.0 - 100.0 fl Final  . MCHC 06/11/2019 31.2  30.0 - 36.0 g/dL Final  . RDW 06/11/2019 15.2  11.5 - 15.5 % Final  . Microalb, Ur 06/11/2019 2.7* 0.0 - 1.9 mg/dL Final  . Creatinine,U 06/11/2019 121.9  mg/dL Final  . Microalb Creat Ratio 06/11/2019 2.2  0.0 - 30.0 mg/g Final  . Sodium 06/11/2019 140  135 - 145 mEq/L Final  . Potassium 06/11/2019 4.3  3.5 - 5.1 mEq/L Final  . Chloride 06/11/2019 103  96 - 112 mEq/L Final  . CO2 06/11/2019 31  19 - 32 mEq/L Final  . Glucose, Bld 06/11/2019 173* 70 - 99 mg/dL Final  . BUN 06/11/2019 15  6 - 23 mg/dL Final  . Creatinine, Ser 06/11/2019 1.19  0.40 - 1.50 mg/dL Final  . Total Bilirubin 06/11/2019 0.5  0.2 - 1.2 mg/dL Final   . Alkaline Phosphatase 06/11/2019 40  39 - 117 U/L Final  . AST 06/11/2019 22  0 - 37 U/L Final  .  ALT 06/11/2019 16  0 - 53 U/L Final  . Total Protein 06/11/2019 6.3  6.0 - 8.3 g/dL Final  . Albumin 06/11/2019 4.1  3.5 - 5.2 g/dL Final  . Calcium 06/11/2019 9.0  8.4 - 10.5 mg/dL Final  . GFR 06/11/2019 59.51* >60.00 mL/min Final  . Hgb A1c MFr Bld 06/11/2019 7.8* 4.6 - 6.5 % Final   Glycemic Control Guidelines for People with Diabetes:Non Diabetic:  <6%Goal of Therapy: <7%Additional Action Suggested:  >8%     Allergies as of 06/14/2019      Reactions   Glycopyrrolate Rash      Medication List       Accurate as of June 14, 2019  2:40 PM. If you have any questions, ask your nurse or doctor.        amLODipine 5 MG tablet Commonly known as: NORVASC Take 1 tablet by mouth once daily   aspirin 81 MG EC tablet Take 81 mg by mouth daily. Swallow whole.   benazepril 20 MG tablet Commonly known as: LOTENSIN Take 1 tablet (20 mg total) by mouth daily.   canagliflozin 100 MG Tabs tablet Commonly known as: Invokana TAKE 1 TABLET BY MOUTH DAILY BEFORE BREAKFAST   carvedilol 25 MG tablet Commonly known as: COREG TAKE ONE TABLET BY MOUTH TWICE DAILY   doxazosin 4 MG tablet Commonly known as: CARDURA TAKE ONE TABLET BY MOUTH DAILY AT BEDTIME   Durezol 0.05 % Emul Generic drug: Difluprednate Place 1 drop into the right eye 2 (two) times daily.   esomeprazole 40 MG capsule Commonly known as: NEXIUM Take 40 mg by mouth daily at 12 noon.   Florastor 250 MG capsule Generic drug: saccharomyces boulardii Take 250 mg by mouth 2 (two) times daily.   glucagon 1 MG injection Commonly known as: Glucagon Emergency Inject 1 mg into the vein once as needed.   glucose blood test strip Commonly known as: OneTouch Verio USE 1 STRIP TO CHECK GLUCOSE THREE TIMES DAILY   insulin aspart 100 UNIT/ML FlexPen Commonly known as: NovoLOG FlexPen Inject 9 Units into the skin 3 (three) times  daily with meals. INJECT 9 UNITS UNDER THE SKIN THREE TIMES DAILY WITH MEALS.   insulin degludec 100 UNIT/ML Sopn FlexTouch Pen Commonly known as: [place] FlexTouch inject 12 units daily. if fasting blood sugars are over 140 increase dosage up to 15 units daily as directed.   pioglitazone 15 MG tablet Commonly known as: ACTOS Take 1 tablet (15 mg total) by mouth daily.   rosuvastatin 5 MG tablet Commonly known as: CRESTOR TAKE 1 TABLET BY MOUTH EVERY DAY IN THE MORNING   sildenafil 20 MG tablet Commonly known as: REVATIO TAKE 5 TABLETS BY MOUTH AS NEEDED once a week.       Allergies:  Allergies  Allergen Reactions  . Glycopyrrolate Rash    Past Medical History:  Diagnosis Date  . CLL (chronic lymphocytic leukemia) ([place])   . Diabetes mellitus without complication ([place])   . Diverticulosis   . External hemorrhoids   . GERD (gastroesophageal reflux disease)   . History of colon polyps   . Hyperlipidemia   . Hypertension   . Internal hemorrhoids   . Lymphocytosis   . Sleep apnea     Past Surgical History:  Procedure Laterality Date  . BLEPHAROPLASTY    . COLONOSCOPY  2012  . HEMORRHOID SURGERY     Sclerotherapy  . KNEE SURGERY Right 2002  . POLYPECTOMY    . SHOULDER SURGERY  Right 2007    Family History  Problem Relation Age of Onset  . Kidney disease Mother   . CAD Maternal Aunt   . Colon cancer Neg Hx   . Esophageal cancer Neg Hx   . Rectal cancer Neg Hx   . Stomach cancer Neg Hx     Social History:  reports that he has never smoked. He quit smokeless tobacco use about 6 years ago.  His smokeless tobacco use included chew. He reports current alcohol use of about 6.0 standard drinks of alcohol per week. He reports that he does not use drugs.  Review of Systems:   HYPERLIPIDEMIA: The lipid abnormality consists of elevated LDL which is well controlled with 5 mg Crestor   Lab Results  Component Value Date   CHOL 161 02/08/2019   HDL 43.40 02/08/2019    LDLCALC 98 02/08/2019   TRIG 99.0 02/08/2019   CHOLHDL 4 02/08/2019    Last colonoscopy was in 07/2016 Has done Hemoccult again with the gastroenterologist and this was negative   Lab Results  Component Value Date   OCCULTBLD Negative 05/04/2019   OCCULTBLD Negative 05/04/2019   OCCULTBLD Negative 05/04/2019   OCCULTBLD Negative 05/04/2019   OCCULTBLD Negative 05/04/2019   OCCULTBLD Negative 05/04/2019     ROS  CLL: Followed by oncologist and recent white count is high but he is not having any new symptoms  Lab Results  Component Value Date   WBC 80.9 Repeated and verified X2. ([place]) 06/11/2019        Objective:    Physical Exam  There were no vitals taken for this visit.    ASSESSMENT/ PLAN:   Diabetes type 2, insulin-dependent and nonobese  See history of present illness for detailed discussion of his current management, blood sugar patterns and problems identified  His A1c is still relatively high at 7.8, previously 7.7  Difficult to assess his control as he is not monitoring glucose much at home and usually not after meals Fasting readings are variable because of late night snacks at times Also unclear why his blood sugars are frequently higher before dinnertime even several hours after his last meal His day-to-day routine and management was discussed He previously had done better with adding Invokana which he has not taken for a month  He has not had significant hypoglycemia although he prefers to have some glucagon on hand  Recommended the following today  To check blood sugar more consistently by rotation at different times  Again discussed possibility of using freestyle libre if he monitors it off  Take 2 units extra for any large carbohydrate snacks during the day or at night at bedtime such as cookies  Encouraged him to walk for exercise in the early morning or late evening  No change in Box, reassured him that his level  of control would be better and this does not cause excessive weight loss that would be detrimental     Hyperlipidemia: Adequately controlled on 5 mg Crestor as of 2/20  HYPERTENSION: Usually-controlled Blood pressure needs to be monitored in the office when he comes in but advised him to watch the blood pressure at least once a week now May also do a little better with adding back Invokana  Microalbumin is normal  CLL: He will need to contact the hematologist because of higher WBC  Total duration of phone call encounter =16 minutes  There are no Patient Instructions on file for this visit.   Tony Marshall  Tony Marshall 06/14/2019, 2:40 PM

## 2019-06-16 ENCOUNTER — Telehealth: Payer: Self-pay | Admitting: *Deleted

## 2019-06-16 NOTE — Telephone Encounter (Signed)
Attempt to reach pt regarding pt concerns on recent lab work. WIll attempt to reach at later time. Message for pt:  MD reviewed labs, "they are ok, we will continue to watch. Follow up as scheduled"

## 2019-06-19 DIAGNOSIS — R69 Illness, unspecified: Secondary | ICD-10-CM | POA: Diagnosis not present

## 2019-07-13 DIAGNOSIS — R69 Illness, unspecified: Secondary | ICD-10-CM | POA: Diagnosis not present

## 2019-07-21 DIAGNOSIS — R69 Illness, unspecified: Secondary | ICD-10-CM | POA: Diagnosis not present

## 2019-08-02 ENCOUNTER — Encounter: Payer: Self-pay | Admitting: Internal Medicine

## 2019-08-09 ENCOUNTER — Other Ambulatory Visit: Payer: Self-pay | Admitting: Endocrinology

## 2019-08-23 DIAGNOSIS — R69 Illness, unspecified: Secondary | ICD-10-CM | POA: Diagnosis not present

## 2019-09-13 ENCOUNTER — Telehealth: Payer: Self-pay | Admitting: Endocrinology

## 2019-09-13 ENCOUNTER — Other Ambulatory Visit: Payer: Self-pay

## 2019-09-13 ENCOUNTER — Other Ambulatory Visit (INDEPENDENT_AMBULATORY_CARE_PROVIDER_SITE_OTHER): Payer: Medicare HMO

## 2019-09-13 DIAGNOSIS — Z794 Long term (current) use of insulin: Secondary | ICD-10-CM

## 2019-09-13 DIAGNOSIS — E1165 Type 2 diabetes mellitus with hyperglycemia: Secondary | ICD-10-CM | POA: Diagnosis not present

## 2019-09-13 LAB — COMPREHENSIVE METABOLIC PANEL
ALT: 11 U/L (ref 0–53)
AST: 20 U/L (ref 0–37)
Albumin: 3.8 g/dL (ref 3.5–5.2)
Alkaline Phosphatase: 42 U/L (ref 39–117)
BUN: 10 mg/dL (ref 6–23)
CO2: 30 mEq/L (ref 19–32)
Calcium: 9 mg/dL (ref 8.4–10.5)
Chloride: 104 mEq/L (ref 96–112)
Creatinine, Ser: 1.14 mg/dL (ref 0.40–1.50)
GFR: 62.49 mL/min (ref >60.00)
Glucose, Bld: 194 mg/dL — ABNORMAL HIGH (ref 70–99)
Potassium: 4.2 mEq/L (ref 3.5–5.1)
Sodium: 139 mEq/L (ref 135–145)
Total Bilirubin: 0.5 mg/dL (ref 0.2–1.2)
Total Protein: 5.9 g/dL — ABNORMAL LOW (ref 6.0–8.3)

## 2019-09-13 LAB — LIPID PANEL
Cholesterol: 124 mg/dL (ref 0–200)
HDL: 40.9 mg/dL (ref >39.00)
LDL Cholesterol: 70 mg/dL (ref 0–99)
NonHDL: 82.73
Total CHOL/HDL Ratio: 3
Triglycerides: 66 mg/dL (ref 0.0–149.0)
VLDL: 13.2 mg/dL (ref 0.0–40.0)

## 2019-09-13 LAB — HEMOGLOBIN A1C: Hgb A1c MFr Bld: 7.5 % — ABNORMAL HIGH (ref 4.6–6.5)

## 2019-09-13 NOTE — Telephone Encounter (Signed)
Not sure if we have samples enough.  Can he wait till Wednesday when he comes in?

## 2019-09-13 NOTE — Telephone Encounter (Signed)
Patients wife has called requesting samples of insulin aspart (NOVOLOG FLEXPEN) 100 UNIT/ML FlexPen due to already being in the donut hole.   Please Advise, Thanks

## 2019-09-16 ENCOUNTER — Ambulatory Visit (INDEPENDENT_AMBULATORY_CARE_PROVIDER_SITE_OTHER): Payer: Medicare HMO | Admitting: Endocrinology

## 2019-09-16 ENCOUNTER — Other Ambulatory Visit: Payer: Self-pay | Admitting: Endocrinology

## 2019-09-16 ENCOUNTER — Encounter: Payer: Self-pay | Admitting: Endocrinology

## 2019-09-16 ENCOUNTER — Other Ambulatory Visit: Payer: Self-pay

## 2019-09-16 VITALS — BP 120/60 | HR 58 | Wt 155.8 lb

## 2019-09-16 DIAGNOSIS — E1165 Type 2 diabetes mellitus with hyperglycemia: Secondary | ICD-10-CM

## 2019-09-16 DIAGNOSIS — C9110 Chronic lymphocytic leukemia of B-cell type not having achieved remission: Secondary | ICD-10-CM | POA: Diagnosis not present

## 2019-09-16 DIAGNOSIS — E7800 Pure hypercholesterolemia, unspecified: Secondary | ICD-10-CM | POA: Diagnosis not present

## 2019-09-16 DIAGNOSIS — Z23 Encounter for immunization: Secondary | ICD-10-CM | POA: Diagnosis not present

## 2019-09-16 DIAGNOSIS — Z794 Long term (current) use of insulin: Secondary | ICD-10-CM | POA: Diagnosis not present

## 2019-09-16 DIAGNOSIS — D649 Anemia, unspecified: Secondary | ICD-10-CM | POA: Diagnosis not present

## 2019-09-16 MED ORDER — METFORMIN HCL ER 500 MG PO TB24: 500.0000 mg | ORAL_TABLET | Freq: Every day | ORAL | 3 refills | Status: DC

## 2019-09-16 NOTE — Patient Instructions (Addendum)
Take Metformin at dinner  Tresiba 14 units  May take 3-4 Novolog at [facility] and keep afternoon sugar <160  Check blood sugars on waking up 4-5 days a week  Also check blood sugars about 2 hours after meals and do this after different meals by rotation  Recommended blood sugar levels on waking up are 90-130 and about 2 hours after meal is 130-160  Please bring your blood sugar monitor to each visit, thank you

## 2019-09-16 NOTE — Progress Notes (Signed)
Patient ID: Tony Marshall, male   DOB: 31-Dec-1940, 78 y.o.   MRN: [id number]   Reason for Appointment:  follow-up of various conditions  History of Present Illness   1. Type 2 DIABETES MELITUS, date of diagnosis:  1992        His blood sugar has been  difficult to control over the last few years because of variability in his blood sugars On basal bolus insulin for a few years and his oral hypoglycemic drugs have been continued Also did not tolerate GLP-1 drugs or symlin because of nausea He is essentially looking like a a type I patient with requiring relatively low amounts of insulin and twice a day dosage of basal insulin He has done somewhat better with Levemir compared to Lantus and is using this twice a day also  He has also had overall much better controlled with adding Invokana in 07/2016  Recent history:  Insulin regimen: insulin TRESIBA 12 units am.  Mealtime insulin: Breakfast = 2-3 units, lunch 2-3 units supper 5-6 units based on meal size  Oral hypoglycemic drugs: Actos  His A1c is again relatively high at 7.5  Recent blood sugar patterns and problems:  Blood sugars were not easily controlled and he was told to try Invokana again in June  However he says that because of losing weight with this he stopped taking it  Most of his FASTING readings appear to be high although does have a couple of good readings  He has not increased his [place] despite having high readings in the morning  He has a few readings midday and not clear which readings are after eating, these are mostly slightly high  POSTPRANDIAL readings after supper are not consistently high with only 1 recent high reading  HYPOGLYCEMIA occurred only once after dinner  He says that he also may tend to have low sugars if he takes more than 2 or 3 units of NovoLog before his late morning meal highest reading appears to be 288  Usually eats about the same type of late morning breakfast with  eggs and toast and some meat  Currently not motivated to check blood sugars much with only monitoring less than once a day  He does think he will do better if he had the CGM   Hypoglycemia:  as above   Side effects from medications:  Diarrhea from metformin, weight loss from [facility]       Proper timing of medications in relation to meals: Yes.           Has late breakfast near 12 noon, dinner at 7-8 PM   Monitors blood glucose:  About once or twice a day    Glucometer: One Touch Verio.           Blood Glucose readings from meter download   PRE-MEAL  morning Lunch Dinner Bedtime Overall  Glucose range:  137-246   77-135    Mean/median:      154   POST-MEAL PC Breakfast PC Lunch PC Dinner  Glucose range:   175-288  55-245  Mean/median:      Previous readings:  PRE-MEAL Fasting Lunch Dinner Bedtime Overall  Glucose range:  113-184   121-208    Mean/median:     ?   POST-MEAL PC Breakfast PC Lunch PC Dinner  Glucose range:    125, 115  Mean/median:      Previous readings  PRE-MEAL Fasting Lunch  6 PM Bedtime Overall  Glucose range:  107-193   49, 113    Mean/median:  131    157   POST-MEAL PC Breakfast PC Lunch PC Dinner  Glucose range:   168  55-259  Mean/median:      Physical activity: exercise: Mostly working around the house    Wt Readings from Last 3 Encounters:  09/16/19 155 lb 12.8 oz (70.7 kg)  04/26/19 164 lb (74.4 kg)  02/11/19 158 lb 12.8 oz (72 kg)          LABS:  Lab Results  Component Value Date   HGBA1C 7.5 (H) 09/13/2019   HGBA1C 7.8 (H) 06/11/2019   HGBA1C 7.7 (H) 02/08/2019   Lab Results  Component Value Date   MICROALBUR 2.7 (H) 06/11/2019   LDLCALC 70 09/13/2019   CREATININE 1.14 09/13/2019    2. Hypertension:    His blood pressure has been high for over 30 years and previously difficult to control. He was evaluated in 1/08 with urine metanephrines and renal artery ultrasound.     Currently on multiple medications including amlodipine 5 mg, benazepril 20 mg, carvedilol 25 mg and doxazosin 4 mg Blood pressure at home recently [id number] systolic and normal diastolic  BP Readings from Last 3 Encounters:  09/16/19 120/60  04/26/19 136/67  02/11/19 (!) 142/76      Other ACTIVE problems: See review of systems  LABS:  Lab on 09/13/2019  Component Date Value Ref Range Status  . Cholesterol 09/13/2019 124  0 - 200 mg/dL Final   ATP III Classification       Desirable:  < 200 mg/dL               Borderline High:  200 - 239 mg/dL          High:  > = 240 mg/dL  . Triglycerides 09/13/2019 66.0  0.0 - 149.0 mg/dL Final   Normal:  <150 mg/dLBorderline High:  150 - 199 mg/dL  . HDL 09/13/2019 40.90  >39.00 mg/dL Final  . VLDL 09/13/2019 13.2  0.0 - 40.0 mg/dL Final  . LDL Cholesterol 09/13/2019 70  0 - 99 mg/dL Final  . Total CHOL/HDL Ratio 09/13/2019 3   Final                  Men          Women1/2 Average Risk     3.4          3.3Average Risk          5.0          4.42X Average Risk          9.6          7.13X Average Risk          15.0          11.0                      . NonHDL 09/13/2019 82.73   Final   NOTE:  Non-HDL goal should be 30 mg/dL higher than patient's LDL goal (i.e. LDL goal of < 70 mg/dL, would have non-HDL goal of < 100 mg/dL)  . Sodium 09/13/2019 139  135 - 145 mEq/L Final  . Potassium 09/13/2019 4.2  3.5 - 5.1 mEq/L Final  . Chloride 09/13/2019 104  96 - 112 mEq/L Final  . CO2 09/13/2019 30  19 - 32 mEq/L Final  .  Glucose, Bld 09/13/2019 194* 70 - 99 mg/dL Final  . BUN 09/13/2019 10  6 - 23 mg/dL Final  . Creatinine, Ser 09/13/2019 1.14  0.40 - 1.50 mg/dL Final  . Total Bilirubin 09/13/2019 0.5  0.2 - 1.2 mg/dL Final  . Alkaline Phosphatase 09/13/2019 42  39 - 117 U/L Final  . AST 09/13/2019 20  0 - 37 U/L Final  . ALT 09/13/2019 11  0 - 53 U/L Final  . Total Protein 09/13/2019 5.9* 6.0 - 8.3 g/dL Final  . Albumin 09/13/2019 3.8  3.5 - 5.2 g/dL Final   . Calcium 09/13/2019 9.0  8.4 - 10.5 mg/dL Final  . GFR 09/13/2019 62.49  >60.00 mL/min Final  . Hgb A1c MFr Bld 09/13/2019 7.5* 4.6 - 6.5 % Final   Glycemic Control Guidelines for People with Diabetes:Non Diabetic:  <6%Goal of Therapy: <7%Additional Action Suggested:  >8%     Allergies as of 09/16/2019      Reactions   Glycopyrrolate Rash      Medication List       Accurate as of September 16, 2019  4:08 PM. If you have any questions, ask your nurse or doctor.        amLODipine 5 MG tablet Commonly known as: NORVASC Take 1 tablet by mouth once daily   aspirin 81 MG EC tablet Take 81 mg by mouth daily. Swallow whole.   benazepril 20 MG tablet Commonly known as: LOTENSIN Take 1 tablet (20 mg total) by mouth daily.   canagliflozin 100 MG Tabs tablet Commonly known as: Invokana TAKE 1 TABLET BY MOUTH DAILY BEFORE BREAKFAST   carvedilol 25 MG tablet Commonly known as: COREG TAKE ONE TABLET BY MOUTH TWICE DAILY   doxazosin 4 MG tablet Commonly known as: CARDURA TAKE ONE TABLET BY MOUTH DAILY AT BEDTIME   Durezol 0.05 % Emul Generic drug: Difluprednate Place 1 drop into the right eye 2 (two) times daily.   esomeprazole 40 MG capsule Commonly known as: NEXIUM Take 40 mg by mouth daily at 12 noon.   Florastor 250 MG capsule Generic drug: saccharomyces boulardii Take 250 mg by mouth 2 (two) times daily.   Glucagon Emergency 1 MG injection Generic drug: glucagon Inject 1 mg into the skin once as needed (For severe low blood sugar).   glucose blood test strip Commonly known as: OneTouch Verio USE 1 STRIP TO CHECK GLUCOSE THREE TIMES DAILY   insulin aspart 100 UNIT/ML FlexPen Commonly known as: NovoLOG FlexPen Inject 9 Units into the skin 3 (three) times daily with meals. INJECT 9 UNITS UNDER THE SKIN THREE TIMES DAILY WITH MEALS.   insulin degludec 100 UNIT/ML Sopn FlexTouch Pen Commonly known as: [place] FlexTouch inject 12 units daily. if fasting blood sugars  are over 140 increase dosage up to 15 units daily as directed.   pioglitazone 15 MG tablet Commonly known as: ACTOS Take 1 tablet (15 mg total) by mouth daily.   rosuvastatin 5 MG tablet Commonly known as: CRESTOR TAKE 1 TABLET BY MOUTH EVERY DAY IN THE MORNING   sildenafil 20 MG tablet Commonly known as: REVATIO TAKE 5 TABLETS BY MOUTH AS NEEDED once a week.       Allergies:  Allergies  Allergen Reactions  . Glycopyrrolate Rash    Past Medical History:  Diagnosis Date  . CLL (chronic lymphocytic leukemia) ([place])   . Diabetes mellitus without complication ([place])   . Diverticulosis   . External hemorrhoids   . GERD (gastroesophageal reflux disease)   .  History of colon polyps   . Hyperlipidemia   . Hypertension   . Internal hemorrhoids   . Lymphocytosis   . Sleep apnea     Past Surgical History:  Procedure Laterality Date  . BLEPHAROPLASTY    . COLONOSCOPY  2012  . HEMORRHOID SURGERY     Sclerotherapy  . KNEE SURGERY Right 2002  . POLYPECTOMY    . SHOULDER SURGERY Right 2007    Family History  Problem Relation Age of Onset  . Kidney disease Mother   . CAD Maternal Aunt   . Colon cancer Neg Hx   . Esophageal cancer Neg Hx   . Rectal cancer Neg Hx   . Stomach cancer Neg Hx     Social History:  reports that he has never smoked. He quit smokeless tobacco use about 6 years ago.  His smokeless tobacco use included chew. He reports current alcohol use of about 6.0 standard drinks of alcohol per week. He reports that he does not use drugs.  Review of Systems:  FATIGUE: He says he tends to stay tired and this is not new He has not had any anemia, thyroid dysfunction, hypogonadism He previously had issues with snoring but he thinks he sleeps well now and previously had difficulty trying the CPAP  Lab Results  Component Value Date   TSH 1.68 10/13/2017   TSH 0.80 05/13/2014   FREET4 0.89 10/13/2017   Lab Results  Component Value Date   TESTOSTERONE 590  08/26/2018    HYPERLIPIDEMIA: The lipid abnormality consists of elevated LDL which is well controlled with 5 mg Crestor   Lab Results  Component Value Date   CHOL 124 09/13/2019   HDL 40.90 09/13/2019   LDLCALC 70 09/13/2019   TRIG 66.0 09/13/2019   CHOLHDL 3 09/13/2019    Last colonoscopy was in 07/2016 Has done Hemoccult with the gastroenterologist and this was negative   Lab Results  Component Value Date   OCCULTBLD Negative 05/04/2019   OCCULTBLD Negative 05/04/2019   OCCULTBLD Negative 05/04/2019   OCCULTBLD Negative 05/04/2019   OCCULTBLD Negative 05/04/2019   OCCULTBLD Negative 05/04/2019     ROS  CLL: Followed by oncologist and not due for follow-up until next month  Lab Results  Component Value Date   WBC 80.9 Repeated and verified X2. ([place]) 06/11/2019        Objective:    Physical Exam  BP 120/60   Pulse (!) 58   Wt 155 lb 12.8 oz (70.7 kg)   SpO2 98%   BMI 24.40 kg/m     ASSESSMENT/ PLAN:   Diabetes type 2, insulin-dependent and nonobese  See history of present illness for detailed discussion of his current management, blood sugar patterns and problems identified  His A1c is still relatively high at 7.5  He previously had benefited with better control with Invokana but he did not want to take this because it causes weight loss He is not motivated to check his blood sugar much and only started checking blood sugars regularly in the last week but only in the morning and sometime in the evening Overall FASTING readings are generally higher Also may have higher readings after his breakfast but does not monitor No other he can qualify for CGM with documenting monitoring 4 times a day he does not do this Hypoglycemia has only been occasional with overestimating suppertime insulin coverage  Also currently having difficulties affording his NovoLog Since his fasting readings are high he may benefit from  metformin although he thinks it caused  diarrhea Discussed goals of fasting glucose below 130 consistently Also discussed A1c goal  Recommended the following today  To check blood sugar daily and if he documents checking 4 times a day we can prescribe the freestyle libre  He likely will need more insulin to cover his morning meal and needs to check readings 2 hours later  Recommended regular exercise with walking or other activities, his wife was in the exam room and discussed that he can walk with her  Tresiba 14 units daily  Trial of metformin ER again with 500 mg in the evening, may consider increasing this also  Recheck A1c in 3 months   FATIGUE: Etiology unclear and may well be from his CLL Will recheck B12 on the next visit  Hyperlipidemia: Adequately controlled on 5 mg Crestor  HYPERTENSION: Well controlled on multiple medications Blood pressure to be monitored regularly at home also   CLL: He will follow-up with the hematologist, concern of higher WBC  Total visit time for evaluation and management of multiple problems and counseling =25 minutes   Patient Instructions  Take Metformin at dinner  Tresiba 14 units  Influenza vaccine given and patient information provided  Elayne Snare 09/16/2019, 4:08 PM

## 2019-10-05 ENCOUNTER — Encounter: Payer: Self-pay | Admitting: Internal Medicine

## 2019-10-05 ENCOUNTER — Ambulatory Visit: Payer: Medicare HMO | Admitting: Internal Medicine

## 2019-10-05 VITALS — BP 130/70 | HR 68 | Temp 98.4°F | Ht 67.0 in | Wt 156.6 lb

## 2019-10-05 DIAGNOSIS — R1013 Epigastric pain: Secondary | ICD-10-CM | POA: Diagnosis not present

## 2019-10-05 DIAGNOSIS — R6881 Early satiety: Secondary | ICD-10-CM

## 2019-10-05 DIAGNOSIS — D509 Iron deficiency anemia, unspecified: Secondary | ICD-10-CM | POA: Diagnosis not present

## 2019-10-05 DIAGNOSIS — C9110 Chronic lymphocytic leukemia of B-cell type not having achieved remission: Secondary | ICD-10-CM | POA: Diagnosis not present

## 2019-10-05 NOTE — Patient Instructions (Signed)
You have been scheduled for an endoscopy. Please follow written instructions given to you at your visit today. If you use inhalers (even only as needed), please bring them with you on the day of your procedure. Your physician has requested that you go to www.[email or web address] and enter the access code given to you at your visit today. This web site gives a general overview about your procedure. However, you should still follow specific instructions given to you by our office regarding your preparation for the procedure.  If you are age 63 or older, your body mass index should be between 23-30. Your Body mass index is 24.53 kg/m. If this is out of the aforementioned range listed, please consider follow up with your Primary Care Provider.  If you are age 5 or younger, your body mass index should be between 19-25. Your Body mass index is 24.53 kg/m. If this is out of the aformentioned range listed, please consider follow up with your Primary Care Provider.

## 2019-10-05 NOTE — Progress Notes (Signed)
Subjective:    Patient ID: Tony Marshall, male    DOB: 1941-09-19, 78 y.o.   MRN: [id number]  HPI Fordyce Elm is a 78 year old male with a history of colonic diverticulosis, remote adenomatous colon polyps in 2008, hemorrhoids, GERD who seen for follow-up.  He has a significant history of CLL, diabetes, hypertension and hyperlipidemia.  I saw him in March to evaluate heme positive stool.  He is here today with his wife.  We decided after his last clinic visit to repeat Hemoccult cards which he did x3.  These were negative for heme.  We also checked iron studies and at that time his ferritin was normal at 76, percent sat 23 and transferrin slightly low at 188.  Iron normal at 60.  His iron studies were again checked by primary care in late June 2020 and his percent sat had dropped to 18.4.  Ferritin was not repeated at that time.  He reports that he is having issue with epigastric discomfort and tightness.  He reports that it is an "inconvenient" feeling in his epigastrium.  Rare indigestion worse with greasy food.  No nausea or vomiting.  Occasionally he feels like food will stop when transiting into his stomach and he tries to push this down with water.  He feels averse to some foods that he usually enjoys but he is hungry.  He is lost 5 pounds in 3 to 4 months.  He is off Nexium because it did not seem to help.  His stomach feels "raw".  He does use Tums on occasion which seemed to help.  Bowel movements have been fairly regular without blood or melena.  He will be seeing Dr. Julien Nordmann next month in follow-up for his CLL.  Review of Systems As per HPI, otherwise negative  Current Medications, Allergies, Past Medical History, Past Surgical History, Family History and Social History were reviewed in [employer] record.     Objective:   Physical Exam BP 130/70   Pulse 68   Temp 98.4 F (36.9 C)   Ht 5\' 7"  (1.702 m)   Wt 156 lb 9.6 oz (71 kg)   BMI 24.53 kg/m   Gen: awake, alert, NAD HEENT: anicteric CV: RRR, no mrg Pulm: CTA b/l Abd: soft, NT/ND, +BS throughout Ext: no c/c/e Neuro: nonfocal  CBC    Component Value Date/Time   WBC 80.9 Repeated and verified X2. ([facility]) 06/11/2019 1033   RBC 3.90 (L) 06/11/2019 1033   HGB 12.1 (L) 06/11/2019 1033   HGB 11.3 (L) 04/26/2019 1344   HGB 13.2 11/05/2017 1308   HCT 38.7 (L) 06/11/2019 1033   HCT 40.8 11/05/2017 1308   PLT 227.0 06/11/2019 1033   PLT 179 04/26/2019 1344   PLT 205 11/05/2017 1308   MCV 99.2 06/11/2019 1033   MCV 95.8 11/05/2017 1308   MCH 30.9 04/26/2019 1344   MCHC 31.2 06/11/2019 1033   RDW 15.2 06/11/2019 1033   RDW 14.0 11/05/2017 1308   LYMPHSABS 64.4 (H) 04/30/2019 1202   LYMPHSABS 66.1 (H) 11/05/2017 1308   MONOABS 0.7 04/30/2019 1202   MONOABS 1.4 (H) 11/05/2017 1308   EOSABS 0.3 04/30/2019 1202   EOSABS 0.3 11/05/2017 1308   BASOSABS 0.1 04/30/2019 1202   BASOSABS 0.2 (H) 11/05/2017 1308   CMP     Component Value Date/Time   NA 139 09/13/2019 1417   NA 141 08/06/2017 1306   K 4.2 09/13/2019 1417   K 4.3 08/06/2017 1306  CL 104 09/13/2019 1417   CO2 30 09/13/2019 1417   CO2 25 08/06/2017 1306   GLUCOSE 194 (H) 09/13/2019 1417   GLUCOSE 150 (H) 08/06/2017 1306   BUN 10 09/13/2019 1417   BUN 22.9 08/06/2017 1306   CREATININE 1.14 09/13/2019 1417   CREATININE 1.45 (H) 04/26/2019 1344   CREATININE 1.3 08/06/2017 1306   CALCIUM 9.0 09/13/2019 1417   CALCIUM 9.3 08/06/2017 1306   PROT 5.9 (L) 09/13/2019 1417   PROT 6.5 08/06/2017 1306   ALBUMIN 3.8 09/13/2019 1417   ALBUMIN 3.8 08/06/2017 1306   AST 20 09/13/2019 1417   AST 21 04/26/2019 1344   AST 20 08/06/2017 1306   ALT 11 09/13/2019 1417   ALT 20 04/26/2019 1344   ALT 19 08/06/2017 1306   ALKPHOS 42 09/13/2019 1417   ALKPHOS 47 08/06/2017 1306   BILITOT 0.5 09/13/2019 1417   BILITOT 0.5 04/26/2019 1344   BILITOT 0.46 08/06/2017 1306   GFRNONAA 47 (L) 04/26/2019 1344   GFRAA 54 (L)  04/26/2019 1344   Iron/TIBC/Ferritin/ %Sat    Component Value Date/Time   IRON 51 06/11/2019 1033   FERRITIN 75.6 04/30/2019 1202   IRONPCTSAT 18.4 (L) 06/11/2019 1033      Assessment & Plan:  78 year old male with a history of colonic diverticulosis, remote adenomatous colon polyps in 2008, hemorrhoids, GERD who seen for follow-up.   1.  Epigastric pain/anemia with mixed picture/dysphagia--there is a question of iron deficiency anemia, though his ferritin when last checked.  Percent sat mildly low.  Heme positive stool early in 2020 but heme-negative when checked in May 2020.  He is up-to-date with colonoscopy.  I recommended we proceed with upper endoscopy to evaluate his epigastric pain, dysphagia.  He has been off his Nexium which I will not resume until we investigate his upper abdominal pain at endoscopy.  He is up-to-date with colonoscopy. --EGD in the [place]  2.  Anemia --he will follow-up with Dr. Julien Nordmann next month.  I would like his opinion regarding the anemia and whether he should be considered for iron replacement.  3.  CLL --he will follow with Dr. Julien Nordmann next month, continues to have profound leukocytosis  4.  History of severe diverticulosis/intermittent constipation --constipation not much of an issue of late.  No recent diverticulitis or lower GI complaint today.  Monitor.  25 minutes spent with the patient today. Greater than 50% was spent in counseling and coordination of care with the patient

## 2019-10-07 ENCOUNTER — Encounter: Payer: Self-pay | Admitting: Internal Medicine

## 2019-10-07 ENCOUNTER — Ambulatory Visit (AMBULATORY_SURGERY_CENTER): Payer: Medicare HMO | Admitting: Internal Medicine

## 2019-10-07 ENCOUNTER — Other Ambulatory Visit: Payer: Self-pay

## 2019-10-07 VITALS — BP 148/70 | HR 57 | Temp 98.1°F | Resp 9 | Ht 67.0 in | Wt 156.0 lb

## 2019-10-07 DIAGNOSIS — R1310 Dysphagia, unspecified: Secondary | ICD-10-CM | POA: Diagnosis not present

## 2019-10-07 DIAGNOSIS — G4733 Obstructive sleep apnea (adult) (pediatric): Secondary | ICD-10-CM | POA: Diagnosis not present

## 2019-10-07 DIAGNOSIS — R1013 Epigastric pain: Secondary | ICD-10-CM | POA: Diagnosis not present

## 2019-10-07 DIAGNOSIS — K2950 Unspecified chronic gastritis without bleeding: Secondary | ICD-10-CM

## 2019-10-07 DIAGNOSIS — E119 Type 2 diabetes mellitus without complications: Secondary | ICD-10-CM | POA: Diagnosis not present

## 2019-10-07 DIAGNOSIS — K222 Esophageal obstruction: Secondary | ICD-10-CM

## 2019-10-07 DIAGNOSIS — R1319 Other dysphagia: Secondary | ICD-10-CM

## 2019-10-07 DIAGNOSIS — K449 Diaphragmatic hernia without obstruction or gangrene: Secondary | ICD-10-CM | POA: Diagnosis not present

## 2019-10-07 DIAGNOSIS — I10 Essential (primary) hypertension: Secondary | ICD-10-CM | POA: Diagnosis not present

## 2019-10-07 MED ORDER — SODIUM CHLORIDE 0.9 % IV SOLN: 500.0000 mL | Freq: Once | INTRAVENOUS | Status: DC

## 2019-10-07 NOTE — Progress Notes (Signed)
Called to room to assist during endoscopic procedure.  Patient ID and intended procedure confirmed with present staff. Received instructions for my participation in the procedure from the performing physician.  

## 2019-10-07 NOTE — Patient Instructions (Signed)
HANDOUTS PROVIDED ON: ESOPHAGITIS AND STRICTURE & HIATAL HERNIA  THE BIOPSIES TAKEN TODAY HAVE BEEN SENT TO PATHOLOGY.  THE RESULTS CAN TAKE 2-3 WEEKS TO RECEIVE.    YOU MAY RESUME YOUR PREVIOUS DIET AND MEDICATION SCHEDULE TODAY.  Norton YOU FOR ALLOWING [place] TO CARE FOR YOU TODAY!!  YOU HAD AN ENDOSCOPIC PROCEDURE TODAY AT [facility]:   Refer to the procedure report that was given to you for any specific questions about what was found during the examination.  If the procedure report does not answer your questions, please call your gastroenterologist to clarify.  If you requested that your care partner not be given the details of your procedure findings, then the procedure report has been included in a sealed envelope for you to review at your convenience later.  YOU SHOULD EXPECT: Some feelings of bloating in the abdomen. Passage of more gas than usual.  Walking can help get rid of the air that was put into your GI tract during the procedure and reduce the bloating. If you had a lower endoscopy (such as a colonoscopy or flexible sigmoidoscopy) you may notice spotting of blood in your stool or on the toilet paper. If you underwent a bowel prep for your procedure, you may not have a normal bowel movement for a few days.  Please Note:  You might notice some irritation and congestion in your nose or some drainage.  This is from the oxygen used during your procedure.  There is no need for concern and it should clear up in a day or so.  SYMPTOMS TO REPORT IMMEDIATELY:   Following upper endoscopy (EGD)  Vomiting of blood or coffee ground material  New chest pain or pain under the shoulder blades  Painful or persistently difficult swallowing  New shortness of breath  Fever of 100F or higher  Black, tarry-looking stools  For urgent or emergent issues, a gastroenterologist can be reached at any hour by calling [phone].   DIET:  We do recommend a small meal at first, but  then you may proceed to your regular diet.  Drink plenty of fluids but you should avoid alcoholic beverages for 24 hours.  ACTIVITY:  You should plan to take it easy for the rest of today and you should NOT DRIVE or use heavy machinery until tomorrow (because of the sedation medicines used during the test).    FOLLOW UP: Our staff will call the number listed on your records 48-72 hours following your procedure to check on you and address any questions or concerns that you may have regarding the information given to you following your procedure. If we do not reach you, we will leave a message.  We will attempt to reach you two times.  During this call, we will ask if you have developed any symptoms of COVID 19. If you develop any symptoms (ie: fever, flu-like symptoms, shortness of breath, cough etc.) before then, please call [phone].  If you test positive for Covid 19 in the 2 weeks post procedure, please call and report this information to [place].    If any biopsies were taken you will be contacted by phone or by letter within the next 1-3 weeks.  Please call us at [phone] if you have not heard about the biopsies in 3 weeks.    SIGNATURES/CONFIDENTIALITY: You and/or your care partner have signed paperwork which will be entered into your electronic medical record.  These signatures attest to the fact that that  the information above on your After Visit Summary has been reviewed and is understood.  Full responsibility of the confidentiality of this discharge information lies with you and/or your care-partner.

## 2019-10-07 NOTE — Op Note (Signed)
[facility] Patient Name: Tony Marshall Procedure Date: 10/07/2019 9:37 AM MRN: [id number] Endoscopist: Jerene Bears , MD Age: 78 Referring MD:  Date of Birth: January 28, 1941 Gender: Male Account #: [id number] Procedure:                Upper GI endoscopy Indications:              Epigastric abdominal pain, Dysphagia, prior heme +                            stools Medicines:                Monitored Anesthesia Care Procedure:                Pre-Anesthesia Assessment:                           - Prior to the procedure, a History and Physical                            was performed, and patient medications and                            allergies were reviewed. The patient's tolerance of                            previous anesthesia was also reviewed. The risks                            and benefits of the procedure and the sedation                            options and risks were discussed with the patient.                            All questions were answered, and informed consent                            was obtained. Prior Anticoagulants: The patient has                            taken no previous anticoagulant or antiplatelet                            agents. ASA Grade Assessment: III - A patient with                            severe systemic disease. After reviewing the risks                            and benefits, the patient was deemed in                            satisfactory condition to undergo the procedure.  After obtaining informed consent, the endoscope was                            passed under direct vision. Throughout the                            procedure, the patient's blood pressure, pulse, and                            oxygen saturations were monitored continuously. The                            Endoscope was introduced through the mouth, and                            advanced to the second part of duodenum. The  upper                            GI endoscopy was accomplished without difficulty.                            The patient tolerated the procedure well. Scope In: Scope Out: Findings:                 A mild Schatzki ring was found just above the                            gastroesophageal junction. A TTS dilator was passed                            through the scope. Dilation with a 16-17-18 mm                            balloon dilator was performed to 18 mm. There was                            significant distal esophageal spasm/contraction                            noted during the esophageal examination and                            dilation.                           A 3-4 cm hiatal hernia was present.                           Diffuse moderate inflammation characterized by                            erythema was found in the gastric fundus, in the                            gastric body  and in the gastric antrum. The                            inflammation appears chronic and atrophic. Biopsies                            were taken with a cold forceps for histology and                            Helicobacter pylori testing.                           The examined duodenum was normal. Complications:            No immediate complications. Estimated Blood Loss:     Estimated blood loss was minimal. Impression:               - Mild Schatzki ring. Dilated to 18 mm with balloon.                           - 3-4 cm hiatal hernia.                           - Gastritis as above. Biopsied.                           - Normal examined duodenum. Recommendation:           - Patient has a contact number available for                            emergencies. The signs and symptoms of potential                            delayed complications were discussed with the                            patient. Return to normal activities tomorrow.                            Written discharge instructions  were provided to the                            patient.                           - Resume previous diet.                           - Continue present medications.                           - Await pathology results. Jerene Bears, MD 10/07/2019 10:00:33 AM This report has been signed electronically.

## 2019-10-07 NOTE — Progress Notes (Signed)
Report given to PACU, vss 

## 2019-10-11 ENCOUNTER — Telehealth: Payer: Self-pay

## 2019-10-11 NOTE — Telephone Encounter (Signed)
  Follow up Call-  Call back number 10/07/2019  Post procedure Call Back phone  # [phone] phone  Permission to leave phone message Yes  Some recent data might be hidden     Patient questions:  Do you have a fever, pain , or abdominal swelling? No. Pain Score  0 *  Have you tolerated food without any problems? Yes.    Have you been able to return to your normal activities? Yes.    Do you have any questions about your discharge instructions: Diet   No. Medications  No. Follow up visit  No.  Do you have questions or concerns about your Care? No.  Actions: * If pain score is 4 or above: No action needed, pain <4.  1. Have you developed a fever since your procedure? no  2.   Have you had an respiratory symptoms (SOB or cough) since your procedure? no  3.   Have you tested positive for COVID 19 since your procedure no  4.   Have you had any family members/close contacts diagnosed with the COVID 19 since your procedure?  no   If yes to any of these questions please route to Joylene John, RN and Alphonsa Gin, Therapist, sports.

## 2019-10-15 ENCOUNTER — Other Ambulatory Visit: Payer: Self-pay

## 2019-10-15 MED ORDER — ESOMEPRAZOLE MAGNESIUM 40 MG PO CPDR: DELAYED_RELEASE_CAPSULE | ORAL | 3 refills | Status: DC

## 2019-11-01 ENCOUNTER — Telehealth: Payer: Self-pay | Admitting: Internal Medicine

## 2019-11-01 ENCOUNTER — Encounter: Payer: Self-pay | Admitting: Internal Medicine

## 2019-11-01 ENCOUNTER — Inpatient Hospital Stay: Payer: Medicare HMO

## 2019-11-01 ENCOUNTER — Inpatient Hospital Stay: Payer: Medicare HMO | Attending: Internal Medicine | Admitting: Internal Medicine

## 2019-11-01 ENCOUNTER — Other Ambulatory Visit: Payer: Self-pay

## 2019-11-01 VITALS — BP 126/70 | HR 51 | Temp 97.8°F | Resp 17 | Ht 67.0 in | Wt 157.4 lb

## 2019-11-01 DIAGNOSIS — D649 Anemia, unspecified: Secondary | ICD-10-CM | POA: Diagnosis not present

## 2019-11-01 DIAGNOSIS — C9110 Chronic lymphocytic leukemia of B-cell type not having achieved remission: Secondary | ICD-10-CM | POA: Diagnosis not present

## 2019-11-01 DIAGNOSIS — D72820 Lymphocytosis (symptomatic): Secondary | ICD-10-CM | POA: Diagnosis not present

## 2019-11-01 DIAGNOSIS — I10 Essential (primary) hypertension: Secondary | ICD-10-CM | POA: Diagnosis not present

## 2019-11-01 LAB — CMP (CANCER CENTER ONLY)
ALT: 13 U/L (ref 0–44)
AST: 19 U/L (ref 15–41)
Albumin: 3.8 g/dL (ref 3.5–5.0)
Alkaline Phosphatase: 56 U/L (ref 38–126)
Anion gap: 10 (ref 5–15)
BUN: 16 mg/dL (ref 8–23)
CO2: 26 mmol/L (ref 22–32)
Calcium: 8.8 mg/dL — ABNORMAL LOW (ref 8.9–10.3)
Chloride: 107 mmol/L (ref 98–111)
Creatinine: 1.22 mg/dL (ref 0.61–1.24)
GFR, Est AFR Am: >60 mL/min (ref >60)
GFR, Estimated: 58 mL/min — ABNORMAL LOW (ref >60)
Glucose, Bld: 166 mg/dL — ABNORMAL HIGH (ref 70–99)
Potassium: 4.3 mmol/L (ref 3.5–5.1)
Sodium: 143 mmol/L (ref 135–145)
Total Bilirubin: 0.4 mg/dL (ref 0.3–1.2)
Total Protein: 6.2 g/dL — ABNORMAL LOW (ref 6.5–8.1)

## 2019-11-01 LAB — CBC WITH DIFFERENTIAL (CANCER CENTER ONLY)
Abs Immature Granulocytes: 0 10*3/uL (ref 0.00–0.07)
Basophils Absolute: 0 10*3/uL (ref 0.0–0.1)
Basophils Relative: 0 %
Eosinophils Absolute: 0 10*3/uL (ref 0.0–0.5)
Eosinophils Relative: 0 %
HCT: 35 % — ABNORMAL LOW (ref 39.0–52.0)
Hemoglobin: 11 g/dL — ABNORMAL LOW (ref 13.0–17.0)
Lymphocytes Relative: 89 %
Lymphs Abs: 48.3 10*3/uL — ABNORMAL HIGH (ref 0.7–4.0)
MCH: 31.6 pg (ref 26.0–34.0)
MCHC: 31.4 g/dL (ref 30.0–36.0)
MCV: 100.6 fL — ABNORMAL HIGH (ref 80.0–100.0)
Monocytes Absolute: 0.5 10*3/uL (ref 0.1–1.0)
Monocytes Relative: 1 %
Neutro Abs: 5.4 10*3/uL (ref 1.7–7.7)
Neutrophils Relative %: 10 %
Platelet Count: 236 10*3/uL (ref 150–400)
RBC: 3.48 MIL/uL — ABNORMAL LOW (ref 4.22–5.81)
RDW: 13.2 % (ref 11.5–15.5)
WBC Count: 54.3 10*3/uL (ref 4.0–10.5)
nRBC: 0 % (ref 0.0–0.2)

## 2019-11-01 LAB — LACTATE DEHYDROGENASE: LDH: 325 U/L — ABNORMAL HIGH (ref 98–192)

## 2019-11-01 MED ORDER — INTEGRA PLUS PO CAPS: 1.0000 | ORAL_CAPSULE | Freq: Every morning | ORAL | 2 refills | Status: DC

## 2019-11-01 NOTE — Telephone Encounter (Signed)
Scheduled per 11/16 los, patient received after visit summary and calender. °

## 2019-11-01 NOTE — Progress Notes (Signed)
[facility] [phone]   Fax:([phone]  OFFICE PROGRESS NOTE  Elayne Snare, MD [address] [place] [address]  DIAGNOSIS: Chronic lymphocytic leukemia diagnosed in October 2014.  PRIOR THERAPY: None  CURRENT THERAPY: Observation.  INTERVAL HISTORY: Tony Marshall 78 y.o. male returns to the clinic today for follow-up visit.  The patient is feeling fine today with no concerning complaints except for mild fatigue.  He was seen recently by gastroenterology and had upper endoscopy performed which showed mild gastritis.  The patient denied having any current chest pain, shortness of breath, cough or hemoptysis.  He denied having any fever or chills.  He has no nausea, vomiting, diarrhea or constipation.  He has no headache or visual changes.  He is here today for evaluation and repeat blood count.   MEDICAL HISTORY: Past Medical History:  Diagnosis Date  . CLL (chronic lymphocytic leukemia) ([facility])   . Diabetes mellitus without complication ([facility])   . Diverticulosis   . External hemorrhoids   . GERD (gastroesophageal reflux disease)   . History of colon polyps   . Hyperlipidemia   . Hypertension   . Internal hemorrhoids   . Lymphocytosis   . Sleep apnea     ALLERGIES:  is allergic to glycopyrrolate.  MEDICATIONS:  Current Outpatient Medications  Medication Sig Dispense Refill  . amLODipine (NORVASC) 5 MG tablet Take 1 tablet by mouth once daily 90 tablet 0  . aspirin 81 MG EC tablet Take 81 mg by mouth daily. Swallow whole.    . benazepril (LOTENSIN) 20 MG tablet TAKE 1 TABLET BY MOUTH EVERY DAY 90 tablet 1  . carvedilol (COREG) 25 MG tablet TAKE ONE TABLET BY MOUTH TWICE DAILY  180 tablet 0  . doxazosin (CARDURA) 4 MG tablet TAKE ONE TABLET BY MOUTH DAILY AT BEDTIME  90 tablet 0  . DUREZOL 0.05 % EMUL Place 1 drop into the right eye 2 (two) times daily.    Marland Kitchen esomeprazole (NEXIUM) 40 MG capsule Take 40 mg by mouth daily at  12 noon.    Marland Kitchen esomeprazole (NEXIUM) 40 MG capsule Take 1 by mouth 30 minutes before breakfast 30 capsule 3  . glucagon (GLUCAGON EMERGENCY) 1 MG injection Inject 1 mg into the skin once as needed (For severe low blood sugar). (Patient not taking: Reported on 10/07/2019) 1 each 5  . glucose blood (ONETOUCH VERIO) test strip USE 1 STRIP TO CHECK GLUCOSE THREE TIMES DAILY 100 each 3  . insulin aspart (NOVOLOG FLEXPEN) 100 UNIT/ML FlexPen Inject 9 Units into the skin 3 (three) times daily with meals. INJECT 9 UNITS UNDER THE SKIN THREE TIMES DAILY WITH MEALS. 15 pen 4  . insulin degludec (TRESIBA FLEXTOUCH) 100 UNIT/ML SOPN FlexTouch Pen inject 12 units daily. if fasting blood sugars are over 140 increase dosage up to 15 units daily as directed. 4 pen 4  . metFORMIN (GLUCOPHAGE-XR) 500 MG 24 hr tablet Take 1 tablet (500 mg total) by mouth daily with supper. 90 tablet 3  . pioglitazone (ACTOS) 15 MG tablet TAKE 1 TABLET BY MOUTH EVERY DAY 90 tablet 1  . rosuvastatin (CRESTOR) 5 MG tablet TAKE 1 TABLET BY MOUTH EVERY DAY IN THE MORNING 90 tablet 0  . saccharomyces boulardii (FLORASTOR) 250 MG capsule Take 250 mg by mouth 2 (two) times daily.    . sildenafil (REVATIO) 20 MG tablet TAKE 5 TABLETS BY MOUTH AS NEEDED once a week. 30 tablet  2   No current facility-administered medications for this visit.     SURGICAL HISTORY:  Past Surgical History:  Procedure Laterality Date  . BLEPHAROPLASTY    . COLONOSCOPY  2012  . HEMORRHOID SURGERY     Sclerotherapy  . KNEE SURGERY Right 2002  . POLYPECTOMY    . SHOULDER SURGERY Right 2007    REVIEW OF SYSTEMS:  A comprehensive review of systems was negative except for: Constitutional: positive for fatigue   PHYSICAL EXAMINATION: General appearance: alert, cooperative and no distress Head: Normocephalic, without obvious abnormality, atraumatic Neck: no adenopathy, no JVD, supple, symmetrical, trachea midline and thyroid not enlarged, symmetric, no  tenderness/mass/nodules Lymph nodes: Cervical, supraclavicular, and axillary nodes normal. Resp: clear to auscultation bilaterally Back: symmetric, no curvature. ROM normal. No CVA tenderness. Cardio: regular rate and rhythm, S1, S2 normal, no murmur, click, rub or gallop GI: soft, non-tender; bowel sounds normal; no masses,  no organomegaly Extremities: extremities normal, atraumatic, no cyanosis or edema  ECOG PERFORMANCE STATUS: 1 - Symptomatic but completely ambulatory  Blood pressure 126/70, pulse (!) 51, temperature 97.8 F (36.6 C), temperature source Temporal, resp. rate 17, height 5\' 7"  (1.702 m), weight 157 lb 6.4 oz (71.4 kg), SpO2 99 %.  LABORATORY DATA: Lab Results  Component Value Date   WBC 54.3 (HH) 11/01/2019   HGB 11.0 (L) 11/01/2019   HCT 35.0 (L) 11/01/2019   MCV 100.6 (H) 11/01/2019   PLT 236 11/01/2019      Chemistry      Component Value Date/Time   NA 139 09/13/2019 1417   NA 141 08/06/2017 1306   K 4.2 09/13/2019 1417   K 4.3 08/06/2017 1306   CL 104 09/13/2019 1417   CO2 30 09/13/2019 1417   CO2 25 08/06/2017 1306   BUN 10 09/13/2019 1417   BUN 22.9 08/06/2017 1306   CREATININE 1.14 09/13/2019 1417   CREATININE 1.45 (H) 04/26/2019 1344   CREATININE 1.3 08/06/2017 1306      Component Value Date/Time   CALCIUM 9.0 09/13/2019 1417   CALCIUM 9.3 08/06/2017 1306   ALKPHOS 42 09/13/2019 1417   ALKPHOS 47 08/06/2017 1306   AST 20 09/13/2019 1417   AST 21 04/26/2019 1344   AST 20 08/06/2017 1306   ALT 11 09/13/2019 1417   ALT 20 04/26/2019 1344   ALT 19 08/06/2017 1306   BILITOT 0.5 09/13/2019 1417   BILITOT 0.5 04/26/2019 1344   BILITOT 0.46 08/06/2017 1306       RADIOGRAPHIC STUDIES: No results found.  ASSESSMENT AND PLAN:  This is a very pleasant 78 years old white male with chronic lymphocytic leukemia currently on observation. He continues to do well with no concerning complaints. Repeat CBC today showed decrease in the total white  blood count down to 54.300. I discussed the lab results with the patient today and recommended for him to continue on observation with repeat CBC, comprehensive metabolic panel and LDH in 6 months. Regarding the mild anemia, I started the patient on Integra +1 capsule p.o. daily. He was advised to call immediately if he has any concerning symptoms in the interval. The patient voices understanding of current disease status and treatment options and is in agreement with the current care plan. All questions were answered. The patient knows to call the clinic with any problems, questions or concerns. We can certainly see the patient much sooner if necessary. I spent 10 minutes counseling the patient face to face. The total time spent in the  appointment was 15 minutes.  Disclaimer: This note was dictated with voice recognition software. Similar sounding words can inadvertently be transcribed and may not be corrected upon review.

## 2019-11-05 ENCOUNTER — Other Ambulatory Visit: Payer: Self-pay | Admitting: Endocrinology

## 2019-11-08 ENCOUNTER — Other Ambulatory Visit: Payer: Self-pay | Admitting: Endocrinology

## 2019-11-09 ENCOUNTER — Other Ambulatory Visit: Payer: Self-pay | Admitting: Medical Oncology

## 2019-11-09 DIAGNOSIS — D649 Anemia, unspecified: Secondary | ICD-10-CM

## 2019-11-09 MED ORDER — FERROUS SULFATE 325 (65 FE) MG PO TBEC: 325.0000 mg | DELAYED_RELEASE_TABLET | Freq: Three times a day (TID) | ORAL | 1 refills | Status: DC

## 2019-11-30 ENCOUNTER — Telehealth: Payer: Self-pay | Admitting: Medical Oncology

## 2019-11-30 NOTE — Telephone Encounter (Signed)
Pt picked up , paid out of pocket and started folivane several weeks ago. Marland Kitchen He also has rx for ferrous sulfate at  [facility] . I called [facility] and told wife that he should start the Ferrous sulfate after he finishes the folivane.

## 2019-12-01 DIAGNOSIS — H35371 Puckering of macula, right eye: Secondary | ICD-10-CM | POA: Diagnosis not present

## 2019-12-01 DIAGNOSIS — H3581 Retinal edema: Secondary | ICD-10-CM | POA: Diagnosis not present

## 2019-12-01 DIAGNOSIS — H02102 Unspecified ectropion of right lower eyelid: Secondary | ICD-10-CM | POA: Diagnosis not present

## 2019-12-01 DIAGNOSIS — E103393 Type 1 diabetes mellitus with moderate nonproliferative diabetic retinopathy without macular edema, bilateral: Secondary | ICD-10-CM | POA: Diagnosis not present

## 2019-12-07 ENCOUNTER — Other Ambulatory Visit: Payer: Self-pay | Admitting: Medical Oncology

## 2019-12-07 DIAGNOSIS — D649 Anemia, unspecified: Secondary | ICD-10-CM

## 2019-12-07 MED ORDER — FERROUS SULFATE 325 (65 FE) MG PO TBEC: 325.0000 mg | DELAYED_RELEASE_TABLET | Freq: Three times a day (TID) | ORAL | 0 refills | Status: DC

## 2019-12-09 ENCOUNTER — Telehealth: Payer: Self-pay | Admitting: Medical Oncology

## 2019-12-09 NOTE — Telephone Encounter (Signed)
LVM to give pt a 90 day supply of ferrous sulfate , 1 tablet tid.( #270).

## 2019-12-15 DIAGNOSIS — E113313 Type 2 diabetes mellitus with moderate nonproliferative diabetic retinopathy with macular edema, bilateral: Secondary | ICD-10-CM | POA: Diagnosis not present

## 2019-12-15 DIAGNOSIS — H11041 Peripheral pterygium, stationary, right eye: Secondary | ICD-10-CM | POA: Diagnosis not present

## 2019-12-15 DIAGNOSIS — H26491 Other secondary cataract, right eye: Secondary | ICD-10-CM | POA: Diagnosis not present

## 2019-12-15 DIAGNOSIS — H02402 Unspecified ptosis of left eyelid: Secondary | ICD-10-CM | POA: Diagnosis not present

## 2019-12-15 LAB — HM DIABETES EYE EXAM

## 2019-12-20 ENCOUNTER — Other Ambulatory Visit (INDEPENDENT_AMBULATORY_CARE_PROVIDER_SITE_OTHER): Payer: Medicare HMO

## 2019-12-20 ENCOUNTER — Other Ambulatory Visit: Payer: Self-pay

## 2019-12-20 DIAGNOSIS — Z794 Long term (current) use of insulin: Secondary | ICD-10-CM

## 2019-12-20 DIAGNOSIS — E1165 Type 2 diabetes mellitus with hyperglycemia: Secondary | ICD-10-CM

## 2019-12-20 DIAGNOSIS — C9110 Chronic lymphocytic leukemia of B-cell type not having achieved remission: Secondary | ICD-10-CM

## 2019-12-20 DIAGNOSIS — D649 Anemia, unspecified: Secondary | ICD-10-CM | POA: Diagnosis not present

## 2019-12-20 LAB — COMPREHENSIVE METABOLIC PANEL
ALT: 10 U/L (ref 0–53)
AST: 17 U/L (ref 0–37)
Albumin: 3.9 g/dL (ref 3.5–5.2)
Alkaline Phosphatase: 62 U/L (ref 39–117)
BUN: 18 mg/dL (ref 6–23)
CO2: 31 mEq/L (ref 19–32)
Calcium: 8.9 mg/dL (ref 8.4–10.5)
Chloride: 103 mEq/L (ref 96–112)
Creatinine, Ser: 1.13 mg/dL (ref 0.40–1.50)
GFR: 63.08 mL/min (ref >60.00)
Glucose, Bld: 219 mg/dL — ABNORMAL HIGH (ref 70–99)
Potassium: 4 mEq/L (ref 3.5–5.1)
Sodium: 142 mEq/L (ref 135–145)
Total Bilirubin: 0.5 mg/dL (ref 0.2–1.2)
Total Protein: 6 g/dL (ref 6.0–8.3)

## 2019-12-20 LAB — HEMOGLOBIN A1C: Hgb A1c MFr Bld: 6.7 % — ABNORMAL HIGH (ref 4.6–6.5)

## 2019-12-20 LAB — VITAMIN B12: Vitamin B-12: 184 pg/mL — ABNORMAL LOW (ref 211–911)

## 2019-12-23 ENCOUNTER — Encounter: Payer: Self-pay | Admitting: Endocrinology

## 2019-12-23 ENCOUNTER — Ambulatory Visit (INDEPENDENT_AMBULATORY_CARE_PROVIDER_SITE_OTHER): Payer: Medicare HMO | Admitting: Endocrinology

## 2019-12-23 VITALS — BP 130/68 | HR 60 | Ht 67.0 in | Wt 156.0 lb

## 2019-12-23 DIAGNOSIS — E538 Deficiency of other specified B group vitamins: Secondary | ICD-10-CM | POA: Diagnosis not present

## 2019-12-23 DIAGNOSIS — R5383 Other fatigue: Secondary | ICD-10-CM

## 2019-12-23 DIAGNOSIS — E1165 Type 2 diabetes mellitus with hyperglycemia: Secondary | ICD-10-CM

## 2019-12-23 DIAGNOSIS — Z794 Long term (current) use of insulin: Secondary | ICD-10-CM

## 2019-12-23 DIAGNOSIS — R591 Generalized enlarged lymph nodes: Secondary | ICD-10-CM | POA: Diagnosis not present

## 2019-12-23 DIAGNOSIS — C9110 Chronic lymphocytic leukemia of B-cell type not having achieved remission: Secondary | ICD-10-CM | POA: Diagnosis not present

## 2019-12-23 NOTE — Patient Instructions (Addendum)
Vitamin B 12, 1000ug daily  Check blood sugars on waking up 3-4 days a week  Also check blood sugars about 2 hours after meals and do this after different meals by rotation  Recommended blood sugar levels on waking up are 90-130 and about 2 hours after meal is 130-160  Please bring your blood sugar monitor to each visit, thank you  Take at least 3 units for sandwiches, check more after lunch

## 2019-12-23 NOTE — Progress Notes (Signed)
Patient ID: Tony Marshall, male   DOB: Jun 10, 1941, 79 y.o.   MRN: [id number]   Reason for Appointment:  follow-up of various conditions  History of Present Illness   1. Type 2 DIABETES MELITUS, date of diagnosis:  1992        His blood sugar has been  difficult to control over the last few years because of variability in his blood sugars On basal bolus insulin for a few years and his oral hypoglycemic drugs have been continued Also did not tolerate GLP-1 drugs or symlin because of nausea He is essentially looking like a a type I patient with requiring relatively low amounts of insulin and twice a day dosage of basal insulin He has done somewhat better with Levemir compared to Lantus and is using this twice a day also  He has also had overall much better controlled with adding Invokana in 07/2016  Recent history:  Insulin regimen: insulin TRESIBA 14 units am.  Mealtime insulin: Breakfast = 2-3 units, lunch 2-3 units supper 5-6 units based on meal size  Oral hypoglycemic drugs: Actos, Metformin 573m daily  His A1c is better at 6.7, previously relatively high at 7.5  Recent blood sugar patterns and problems:  He was restarted on Metformin in October because of persistently high A1c over 7  He has taken this without any side effects  Blood sugars are being monitored at home mostly in the evenings before or after dinner and only sporadically midday  He now says that he is eating relatively smaller meals in the mornings  Lunch may be variable and occasionally eating sandwiches, most likely he is only taking 2 units to cover this  POSTPRANDIAL readings at night are quite variable and last month had at least 2 blood sugars in the 50s after dinner  Not clear which of his readings in the evenings are before or after eating  However after lunch his blood sugar in the office was 219 and he cannot explain this  Not clear if his fasting readings are controlled as he has  only 1 reading recently which was 116  He is doing some exercise on his exercise bike  His weight is about the same    Hypoglycemia:  as above   Side effects from medications:  Diarrhea from metformin, [facility]      Proper timing of medications in relation to meals: Yes.           Has late breakfast near 12 noon, dinner at 7-8 PM   Monitors blood glucose:  About once or twice a day    Glucometer: One Touch Verio.           Blood Glucose readings from meter download   PRE-MEAL Fasting  midday  3-5 PM  7-10 PM Overall  Glucose range:  116  102-178  53, 145  51-199   Mean/median:     123   Readings:  PRE-MEAL  morning Lunch Dinner Bedtime Overall  Glucose range:  137-246   77-135    Mean/median:      154   POST-MEAL PC Breakfast PC Lunch PC Dinner  Glucose range:   175-288  55-245  Mean/median:         Wt Readings from Last 3 Encounters:  12/23/19 156 lb (70.8 kg)  11/01/19 157 lb 6.4 oz (71.4 kg)  10/07/19 156 lb (70.8 kg)  LABS:  Lab Results  Component Value Date   HGBA1C 6.7 (H) 12/20/2019   HGBA1C 7.5 (H) 09/13/2019   HGBA1C 7.8 (H) 06/11/2019   Lab Results  Component Value Date   MICROALBUR 2.7 (H) 06/11/2019   LDLCALC 70 09/13/2019   CREATININE 1.13 12/20/2019    2. Hypertension:    His blood pressure has been high for over 30 years and previously difficult to control. He was evaluated in 1/08 with urine metanephrines and renal artery ultrasound.    Current medication regimen: Amlodipine 5 mg, benazepril 20 mg, carvedilol 25 mg twice daily and doxazosin 4 mg Blood pressure at home usually normal Is asking about relatively slow pulse rate  BP Readings from Last 3 Encounters:  12/23/19 130/68  11/01/19 126/70  10/07/19 (!) 148/70      Other ACTIVE problems: See review of systems  LABS:  Lab on 12/20/2019  Component Date Value Ref Range Status  . Vitamin B-12 12/20/2019 184* 211 - 911 pg/mL Final  . Sodium  12/20/2019 142  135 - 145 mEq/L Final  . Potassium 12/20/2019 4.0  3.5 - 5.1 mEq/L Final  . Chloride 12/20/2019 103  96 - 112 mEq/L Final  . CO2 12/20/2019 31  19 - 32 mEq/L Final  . Glucose, Bld 12/20/2019 219* 70 - 99 mg/dL Final  . BUN 12/20/2019 18  6 - 23 mg/dL Final  . Creatinine, Ser 12/20/2019 1.13  0.40 - 1.50 mg/dL Final  . Total Bilirubin 12/20/2019 0.5  0.2 - 1.2 mg/dL Final  . Alkaline Phosphatase 12/20/2019 62  39 - 117 U/L Final  . AST 12/20/2019 17  0 - 37 U/L Final  . ALT 12/20/2019 10  0 - 53 U/L Final  . Total Protein 12/20/2019 6.0  6.0 - 8.3 g/dL Final  . Albumin 12/20/2019 3.9  3.5 - 5.2 g/dL Final  . GFR 12/20/2019 63.08  >60.00 mL/min Final  . Calcium 12/20/2019 8.9  8.4 - 10.5 mg/dL Final  . Hgb A1c MFr Bld 12/20/2019 6.7* 4.6 - 6.5 % Final   Glycemic Control Guidelines for People with Diabetes:Non Diabetic:  <6%Goal of Therapy: <7%Additional Action Suggested:  >8%     Allergies as of 12/23/2019      Reactions   Glycopyrrolate Rash      Medication List       Accurate as of December 23, 2019  6:36 PM. If you have any questions, ask your nurse or doctor.        amLODipine 5 MG tablet Commonly known as: NORVASC Take 1 tablet by mouth once daily   aspirin 81 MG EC tablet Take 81 mg by mouth daily. Swallow whole.   benazepril 20 MG tablet Commonly known as: LOTENSIN TAKE 1 TABLET BY MOUTH EVERY DAY   carvedilol 25 MG tablet Commonly known as: COREG TAKE ONE TABLET BY MOUTH TWICE DAILY   doxazosin 4 MG tablet Commonly known as: CARDURA TAKE ONE TABLET BY MOUTH DAILY AT BEDTIME   Durezol 0.05 % Emul Generic drug: Difluprednate Place 1 drop into the right eye 2 (two) times daily.   esomeprazole 40 MG capsule Commonly known as: NEXIUM Take 40 mg by mouth daily at 12 noon.   esomeprazole 40 MG capsule Commonly known as: NexIUM Take 1 by mouth 30 minutes before breakfast   ferrous sulfate 325 (65 FE) MG EC tablet Take 1 tablet (325 mg total) by  mouth 3 (three) times daily with meals.   Florastor 250 MG capsule Generic  drug: saccharomyces boulardii Take 250 mg by mouth 2 (two) times daily.   Glucagon Emergency 1 MG Kit Inject 1 mg into the skin once as needed (For severe low blood sugar).   glucose blood test strip Commonly known as: OneTouch Verio USE 1 STRIP TO CHECK GLUCOSE THREE TIMES DAILY   insulin degludec 100 UNIT/ML Sopn FlexTouch Pen Commonly known as: [place] FlexTouch inject 12 units daily. if fasting blood sugars are over 140 increase dosage up to 15 units daily as directed. What changed:   how much to take  additional instructions   metFORMIN 500 MG 24 hr tablet Commonly known as: GLUCOPHAGE-XR Take 1 tablet (500 mg total) by mouth daily with supper.   NovoLOG FlexPen 100 UNIT/ML FlexPen Generic drug: insulin aspart Inject 3-4 Units into the skin 3 (three) times daily with meals. Inject 3-4 units under the skin three times daily before meals. What changed: Another medication with the same name was removed. Continue taking this medication, and follow the directions you see here. Changed by: Elayne Snare, MD   pioglitazone 15 MG tablet Commonly known as: ACTOS TAKE 1 TABLET BY MOUTH EVERY DAY   rosuvastatin 5 MG tablet Commonly known as: CRESTOR TAKE 1 TABLET BY MOUTH EVERY DAY IN THE MORNING   sildenafil 20 MG tablet Commonly known as: REVATIO TAKE 5 TABLETS BY MOUTH AS NEEDED once a week.       Allergies:  Allergies  Allergen Reactions  . Glycopyrrolate Rash    Past Medical History:  Diagnosis Date  . CLL (chronic lymphocytic leukemia) ([place])   . Diabetes mellitus without complication ([place])   . Diverticulosis   . External hemorrhoids   . GERD (gastroesophageal reflux disease)   . History of colon polyps   . Hyperlipidemia   . Hypertension   . Internal hemorrhoids   . Lymphocytosis   . Sleep apnea     Past Surgical History:  Procedure Laterality Date  . BLEPHAROPLASTY    .  COLONOSCOPY  2012  . HEMORRHOID SURGERY     Sclerotherapy  . KNEE SURGERY Right 2002  . POLYPECTOMY    . SHOULDER SURGERY Right 2007    Family History  Problem Relation Age of Onset  . Kidney disease Mother   . CAD Maternal Aunt   . Colon cancer Neg Hx   . Esophageal cancer Neg Hx   . Rectal cancer Neg Hx   . Stomach cancer Neg Hx     Social History:  reports that he has never smoked. He quit smokeless tobacco use about 7 years ago.  His smokeless tobacco use included chew. He reports current alcohol use of about 6.0 standard drinks of alcohol per week. He reports that he does not use drugs.  Review of Systems:  FATIGUE: He has had some fatigue No recent thyroid levels available He previously had issues with snoring but he thinks he sleeps well now and previously had difficulty trying the CPAP  Routine B12 level appears to be relatively low Also has anemia  Lab Results  Component Value Date   VITAMINB12 184 (L) 12/20/2019   Lab Results  Component Value Date   HGB 11.0 (L) 11/01/2019    No history of testosterone deficiency, does have a history of ED  Lab Results  Component Value Date   TESTOSTERONE 590 08/26/2018    HYPERLIPIDEMIA: The lipid abnormality consists of elevated LDL which is well controlled with 5 mg Crestor   Lab Results  Component Value Date  CHOL 124 09/13/2019   HDL 40.90 09/13/2019   LDLCALC 70 09/13/2019   TRIG 66.0 09/13/2019   CHOLHDL 3 09/13/2019    Last colonoscopy was in 07/2016 He was told to be iron deficient and is taking supplements  Lab Results  Component Value Date   OCCULTBLD Negative 05/04/2019   OCCULTBLD Negative 05/04/2019   OCCULTBLD Negative 05/04/2019   OCCULTBLD Negative 05/04/2019   OCCULTBLD Negative 05/04/2019   OCCULTBLD Negative 05/04/2019     ROS  CLL: Followed by oncologist and no treatment recommended  Lab Results  Component Value Date   WBC 54.3 ([place]) 11/01/2019   He is asking about a swelling  on the side of his left neck area, painless and present for the last 3 or 4 weeks     Objective:    Physical Exam  BP 130/68 (BP Location: Left Arm, Patient Position: Sitting, Cuff Size: Normal)   Pulse 60   Ht 5' 7"  (1.702 m)   Wt 156 lb (70.8 kg)   SpO2 94%   BMI 24.43 kg/m   He has a 2 cm ovoid nodule in the posterior triangle of the left neck, mobile, firm No other lymphadenopathy No pedal edema   ASSESSMENT/ PLAN:   Diabetes type 2, insulin-dependent and nonobese  See history of present illness for detailed discussion of his current management, blood sugar patterns and problems identified  His A1c is significantly better at 6.7  His blood sugars are generally better although not checked enough Also has some anemia and not clear what this may affect his A1c  He has difficulty remembering to check his blood sugars on waking up as well as after breakfast or lunch to help adjust his insulin doses Only checking blood sugars after dinner With empirical adjustment of his doses his blood sugars after dinner ranged between 51 and 222 recently Fasting blood sugars are relatively better with increasing Tresiba on the last visit Also may be benefiting from starting low-dose Metformin  Recommended the following today  No basic change in insulin, Metformin or pioglitazone  Encouraged him to exercise regularly  Likely needs 3 or possibly 4 units of insulin to cover higher carbohydrate lunches like sandwiches but he will need to adjust this based on readings after his lunch meals, reminded him that his glucose in the lab was 219 PC  No change in [place] unless morning sugars are higher and he needs to check these once more regularly  Make sure he has protein with every meal   NECK nodule: This likely is a lymph node in the posterior cervical lymph node chain He needs to follow-up with his hematologist  Vitamin B12 deficiency: This is new, needs to start 1000 mcg  daily  Hyperlipidemia: Has been controlled on 5 mg Crestor  HYPERTENSION: Well controlled on multiple medications Advised him to check blood pressure regularly at home and call if unusually high or low  Renal function/potassium stable    Patient Instructions  Vitamin B 12, 1000ug daily  Check blood sugars on waking up 3-4 days a week  Also check blood sugars about 2 hours after meals and do this after different meals by rotation  Recommended blood sugar levels on waking up are 90-130 and about 2 hours after meal is 130-160  Please bring your blood sugar monitor to each visit, thank you  Take at least 3 units for sandwiches, check more after lunch      Elayne Snare 12/23/2019, 6:36 PM

## 2019-12-24 ENCOUNTER — Telehealth: Payer: Self-pay | Admitting: Internal Medicine

## 2019-12-24 NOTE — Telephone Encounter (Signed)
Scheduled appt per 1/7 sch message - pt aware of new appt date and time

## 2019-12-28 ENCOUNTER — Other Ambulatory Visit: Payer: Self-pay | Admitting: Internal Medicine

## 2019-12-28 DIAGNOSIS — D649 Anemia, unspecified: Secondary | ICD-10-CM

## 2019-12-29 ENCOUNTER — Inpatient Hospital Stay: Payer: Medicare HMO | Attending: Internal Medicine | Admitting: Internal Medicine

## 2019-12-29 ENCOUNTER — Inpatient Hospital Stay: Payer: Medicare HMO

## 2019-12-29 ENCOUNTER — Encounter: Payer: Self-pay | Admitting: Internal Medicine

## 2019-12-29 ENCOUNTER — Telehealth: Payer: Self-pay

## 2019-12-29 ENCOUNTER — Other Ambulatory Visit: Payer: Self-pay

## 2019-12-29 VITALS — BP 125/67 | HR 64 | Temp 98.0°F | Resp 17 | Ht 67.0 in | Wt 158.1 lb

## 2019-12-29 DIAGNOSIS — I10 Essential (primary) hypertension: Secondary | ICD-10-CM | POA: Diagnosis not present

## 2019-12-29 DIAGNOSIS — D649 Anemia, unspecified: Secondary | ICD-10-CM | POA: Insufficient documentation

## 2019-12-29 DIAGNOSIS — D72820 Lymphocytosis (symptomatic): Secondary | ICD-10-CM

## 2019-12-29 DIAGNOSIS — D72829 Elevated white blood cell count, unspecified: Secondary | ICD-10-CM | POA: Insufficient documentation

## 2019-12-29 DIAGNOSIS — C9110 Chronic lymphocytic leukemia of B-cell type not having achieved remission: Secondary | ICD-10-CM

## 2019-12-29 LAB — CBC WITH DIFFERENTIAL (CANCER CENTER ONLY)
Abs Immature Granulocytes: 0 10*3/uL (ref 0.00–0.07)
Basophils Absolute: 0 10*3/uL (ref 0.0–0.1)
Basophils Relative: 0 %
Eosinophils Absolute: 0.8 10*3/uL — ABNORMAL HIGH (ref 0.0–0.5)
Eosinophils Relative: 1 %
HCT: 31.7 % — ABNORMAL LOW (ref 39.0–52.0)
Hemoglobin: 9.9 g/dL — ABNORMAL LOW (ref 13.0–17.0)
Lymphocytes Relative: 93 %
Lymphs Abs: 71.3 10*3/uL — ABNORMAL HIGH (ref 0.7–4.0)
MCH: 32 pg (ref 26.0–34.0)
MCHC: 31.2 g/dL (ref 30.0–36.0)
MCV: 102.6 fL — ABNORMAL HIGH (ref 80.0–100.0)
Monocytes Absolute: 0.8 10*3/uL (ref 0.1–1.0)
Monocytes Relative: 1 %
Neutro Abs: 3.8 10*3/uL (ref 1.7–7.7)
Neutrophils Relative %: 5 %
Platelet Count: 267 10*3/uL (ref 150–400)
RBC: 3.09 MIL/uL — ABNORMAL LOW (ref 4.22–5.81)
RDW: 13.1 % (ref 11.5–15.5)
WBC Count: 76.7 10*3/uL (ref 4.0–10.5)
nRBC: 0 % (ref 0.0–0.2)

## 2019-12-29 LAB — CMP (CANCER CENTER ONLY)
ALT: 8 U/L (ref 0–44)
AST: 17 U/L (ref 15–41)
Albumin: 3.7 g/dL (ref 3.5–5.0)
Alkaline Phosphatase: 72 U/L (ref 38–126)
Anion gap: 10 (ref 5–15)
BUN: 19 mg/dL (ref 8–23)
CO2: 25 mmol/L (ref 22–32)
Calcium: 8.7 mg/dL — ABNORMAL LOW (ref 8.9–10.3)
Chloride: 107 mmol/L (ref 98–111)
Creatinine: 1.24 mg/dL (ref 0.61–1.24)
GFR, Est AFR Am: >60 mL/min (ref >60)
GFR, Estimated: 56 mL/min — ABNORMAL LOW (ref >60)
Glucose, Bld: 209 mg/dL — ABNORMAL HIGH (ref 70–99)
Potassium: 4.6 mmol/L (ref 3.5–5.1)
Sodium: 142 mmol/L (ref 135–145)
Total Bilirubin: 0.5 mg/dL (ref 0.3–1.2)
Total Protein: 6.1 g/dL — ABNORMAL LOW (ref 6.5–8.1)

## 2019-12-29 LAB — LACTATE DEHYDROGENASE: LDH: 377 U/L — ABNORMAL HIGH (ref 98–192)

## 2019-12-29 NOTE — Telephone Encounter (Signed)
TC from above Pt's wife inquiring about new lab orders she saw on my chart explained to Pt's wife Pt will a return appointment with Dr. Julien Nordmann in 2 weeks that's why labs were ordered. Pt's wife verbalized understanding

## 2019-12-29 NOTE — Progress Notes (Signed)
LATE ENTRY-Received call from [facility] in lab with critical value of WBC 76.6.  Called Dr. Julien Nordmann at his desk to report the value to him over the phone.  Gardiner Rhyme, RN

## 2019-12-29 NOTE — Progress Notes (Signed)
[facility] [phone]   [phone]  OFFICE PROGRESS NOTE  Elayne Snare, MD [address] [place] [address]  DIAGNOSIS: Chronic lymphocytic leukemia diagnosed in October 2014.  PRIOR THERAPY: None  CURRENT THERAPY: Observation.  INTERVAL HISTORY: Tony Marshall 79 y.o. male returns to the clinic today for follow-up visit.  The patient is feeling fine today with no concerning complaints but few weeks ago he noticed a lump on the left side of his neck.  He was seen by his primary care physician and advised to come back for evaluation at the cancer center.  I saw him in November 2020 and he did not have any complaints at that time.  He denied having any recent fever or chills.  He has no nausea, vomiting, diarrhea or constipation.  He has no headache or visual changes.  The patient mentioned that the neck mass got smaller in the last 2 weeks.   MEDICAL HISTORY: Past Medical History:  Diagnosis Date  . CLL (chronic lymphocytic leukemia) ([place])   . Diabetes mellitus without complication ([place])   . Diverticulosis   . External hemorrhoids   . GERD (gastroesophageal reflux disease)   . History of colon polyps   . Hyperlipidemia   . Hypertension   . Internal hemorrhoids   . Lymphocytosis   . Sleep apnea     ALLERGIES:  is allergic to glycopyrrolate.  MEDICATIONS:  Current Outpatient Medications  Medication Sig Dispense Refill  . amLODipine (NORVASC) 5 MG tablet Take 1 tablet by mouth once daily 90 tablet 0  . aspirin 81 MG EC tablet Take 81 mg by mouth daily. Swallow whole.    . benazepril (LOTENSIN) 20 MG tablet TAKE 1 TABLET BY MOUTH EVERY DAY 90 tablet 1  . carvedilol (COREG) 25 MG tablet TAKE ONE TABLET BY MOUTH TWICE DAILY  180 tablet 0  . doxazosin (CARDURA) 4 MG tablet TAKE ONE TABLET BY MOUTH DAILY AT BEDTIME  90 tablet 0  . DUREZOL 0.05 % EMUL Place 1 drop into the right eye 2 (two) times daily.    [place] Kitchen esomeprazole  (NEXIUM) 40 MG capsule Take 40 mg by mouth daily at 12 noon.    [place] Kitchen esomeprazole (NEXIUM) 40 MG capsule Take 1 by mouth 30 minutes before breakfast 30 capsule 3  . ferrous sulfate 325 (65 FE) MG EC tablet TAKE 1 TABLET BY MOUTH 3 TIMES A DAY WITH MEALS 90 tablet 0  . glucagon (GLUCAGON EMERGENCY) 1 MG injection Inject 1 mg into the skin once as needed (For severe low blood sugar). 1 each 5  . glucose blood (ONETOUCH VERIO) test strip USE 1 STRIP TO CHECK GLUCOSE THREE TIMES DAILY 100 each 3  . insulin aspart (NOVOLOG FLEXPEN) 100 UNIT/ML FlexPen Inject 3-4 Units into the skin 3 (three) times daily with meals. Inject 3-4 units under the skin three times daily before meals.    . insulin degludec (TRESIBA FLEXTOUCH) 100 UNIT/ML SOPN FlexTouch Pen inject 12 units daily. if fasting blood sugars are over 140 increase dosage up to 15 units daily as directed. (Patient taking differently: 14 Units. inject 14 units daily. if fasting blood sugars are over 140 increase dosage up to 15 units daily as directed.) 4 pen 4  . metFORMIN (GLUCOPHAGE-XR) 500 MG 24 hr tablet Take 1 tablet (500 mg total) by mouth daily with supper. 90 tablet 3  . pioglitazone (ACTOS) 15 MG tablet TAKE 1 TABLET BY  MOUTH EVERY DAY 90 tablet 1  . rosuvastatin (CRESTOR) 5 MG tablet TAKE 1 TABLET BY MOUTH EVERY DAY IN THE MORNING 90 tablet 0  . saccharomyces boulardii (FLORASTOR) 250 MG capsule Take 250 mg by mouth 2 (two) times daily.    . sildenafil (REVATIO) 20 MG tablet TAKE 5 TABLETS BY MOUTH AS NEEDED once a week. 30 tablet 2   No current facility-administered medications for this visit.    SURGICAL HISTORY:  Past Surgical History:  Procedure Laterality Date  . BLEPHAROPLASTY    . COLONOSCOPY  2012  . HEMORRHOID SURGERY     Sclerotherapy  . KNEE SURGERY Right 2002  . POLYPECTOMY    . SHOULDER SURGERY Right 2007    REVIEW OF SYSTEMS:  A comprehensive review of systems was negative except for: Hematologic/lymphatic: positive for  lymphadenopathy   PHYSICAL EXAMINATION: General appearance: alert, cooperative and no distress Head: Normocephalic, without obvious abnormality, atraumatic Neck: no JVD, supple, symmetrical, trachea midline, thyroid not enlarged, symmetric, no tenderness/mass/nodules and Around 2 cm posterior cervical neck lump Lymph nodes: Around 2 cm posterior cervical neck lump Resp: clear to auscultation bilaterally Back: symmetric, no curvature. ROM normal. No CVA tenderness. Cardio: regular rate and rhythm, S1, S2 normal, no murmur, click, rub or gallop GI: soft, non-tender; bowel sounds normal; no masses,  no organomegaly Extremities: extremities normal, atraumatic, no cyanosis or edema  ECOG PERFORMANCE STATUS: 1 - Symptomatic but completely ambulatory  Blood pressure 125/67, pulse 64, temperature 98 F (36.7 C), temperature source Temporal, resp. rate 17, height 5\' 7"  (1.702 m), weight 158 lb 1.6 oz (71.7 kg), SpO2 100 %.  LABORATORY DATA: Lab Results  Component Value Date   WBC 76.7 (HH) 12/29/2019   HGB 9.9 (L) 12/29/2019   HCT 31.7 (L) 12/29/2019   MCV 102.6 (H) 12/29/2019   PLT 267 12/29/2019      Chemistry      Component Value Date/Time   NA 142 12/20/2019 1501   NA 141 08/06/2017 1306   K 4.0 12/20/2019 1501   K 4.3 08/06/2017 1306   CL 103 12/20/2019 1501   CO2 31 12/20/2019 1501   CO2 25 08/06/2017 1306   BUN 18 12/20/2019 1501   BUN 22.9 08/06/2017 1306   CREATININE 1.13 12/20/2019 1501   CREATININE 1.22 11/01/2019 1518   CREATININE 1.3 08/06/2017 1306      Component Value Date/Time   CALCIUM 8.9 12/20/2019 1501   CALCIUM 9.3 08/06/2017 1306   ALKPHOS 62 12/20/2019 1501   ALKPHOS 47 08/06/2017 1306   AST 17 12/20/2019 1501   AST 19 11/01/2019 1518   AST 20 08/06/2017 1306   ALT 10 12/20/2019 1501   ALT 13 11/01/2019 1518   ALT 19 08/06/2017 1306   BILITOT 0.5 12/20/2019 1501   BILITOT 0.4 11/01/2019 1518   BILITOT 0.46 08/06/2017 1306       RADIOGRAPHIC  STUDIES: No results found.  ASSESSMENT AND PLAN:  This is a very pleasant 79 years old white male with chronic lymphocytic leukemia currently on observation. The patient felt a mass in the left posterior neck area over the last few weeks but started getting smaller in size. Repeat CBC today showed further elevation of his total white blood count to 76.7 but similar to 6 months ago. I recommended for the patient to have CT scan of the neck for further evaluation of the lump in his left neck area and to rule out lymphadenopathy or other suspicious masses. I will  see him back for follow-up visit in 2 weeks for evaluation and discussion of his scan and further recommendation regarding his condition. Regarding the mild anemia, I started the patient on Integra +1 capsule p.o. daily. The patient was advised to call immediately if he has any concerning symptoms in the interval. The patient voices understanding of current disease status and treatment options and is in agreement with the current care plan. All questions were answered. The patient knows to call the clinic with any problems, questions or concerns. We can certainly see the patient much sooner if necessary.  Disclaimer: This note was dictated with voice recognition software. Similar sounding words can inadvertently be transcribed and may not be corrected upon review.

## 2020-01-03 ENCOUNTER — Other Ambulatory Visit: Payer: Self-pay | Admitting: Endocrinology

## 2020-01-05 ENCOUNTER — Encounter (HOSPITAL_COMMUNITY): Payer: Self-pay

## 2020-01-05 ENCOUNTER — Ambulatory Visit (HOSPITAL_COMMUNITY)
Admission: RE | Admit: 2020-01-05 | Discharge: 2020-01-05 | Disposition: A | Discharge status: Home / Self Care | Payer: Medicare HMO | Source: Ambulatory Visit | Attending: Internal Medicine | Admitting: Internal Medicine

## 2020-01-05 ENCOUNTER — Other Ambulatory Visit: Payer: Self-pay

## 2020-01-05 DIAGNOSIS — C9110 Chronic lymphocytic leukemia of B-cell type not having achieved remission: Secondary | ICD-10-CM | POA: Diagnosis present

## 2020-01-05 DIAGNOSIS — R599 Enlarged lymph nodes, unspecified: Secondary | ICD-10-CM | POA: Diagnosis not present

## 2020-01-05 MED ORDER — SODIUM CHLORIDE (PF) 0.9 % IJ SOLN
INTRAMUSCULAR | Status: AC
  Filled 2020-01-05: qty 50

## 2020-01-05 MED ORDER — IOHEXOL 300 MG/ML  SOLN
75.0000 mL | Freq: Once | INTRAMUSCULAR | Status: AC | PRN
  Administered 2020-01-05: 13:00:00 75 mL via INTRAVENOUS

## 2020-01-12 ENCOUNTER — Ambulatory Visit: Payer: Medicare HMO

## 2020-01-13 ENCOUNTER — Other Ambulatory Visit: Payer: Self-pay | Admitting: Internal Medicine

## 2020-01-21 ENCOUNTER — Telehealth: Payer: Self-pay | Admitting: Physician Assistant

## 2020-01-21 ENCOUNTER — Ambulatory Visit: Payer: Medicare HMO

## 2020-01-21 NOTE — Telephone Encounter (Signed)
Patient requested afternoon appt next week

## 2020-01-25 NOTE — Progress Notes (Signed)
[facility] OFFICE PROGRESS NOTE  Elayne Snare, MD [address] [place] [address]  DIAGNOSIS: Chronic lymphocytic leukemia diagnosed in October 2014.  PRIOR THERAPY: None  CURRENT THERAPY: Observation  INTERVAL HISTORY: Tony Marshall 79 y.o. male returns to the clinic for a follow up visit. The patient had been on observation for several years until he recently presented to his PCP for the chief complaint of a lump on the left side of his neck. He recently had a CT scan of the neck to further evaluate this and he is here today to discuss the scan results.   Today, the patient is feeling well. He denies any fatigue, weight loss, or fever. He states he has been having night sweats around his neck for several years and it is unchanged. The cervical lymphadenopathy waxes and wanes. He denies any groin or axillary lymphadenopathy. He denies any signs and symptoms of infections such as sore throat, cough, shortness of breath, dysuria, skin infections, etc. He denies any bleeding or bruising including epistaxis, melena, hematuria, bruising, hematochezia. He is here to discuss his CT scan, repeat blood work, and for a more detailed discussion about his current condition and recommendations for further evaluation.   MEDICAL HISTORY: Past Medical History:  Diagnosis Date  . CLL (chronic lymphocytic leukemia) ([place])   . Diabetes mellitus without complication ([place])   . Diverticulosis   . External hemorrhoids   . GERD (gastroesophageal reflux disease)   . History of colon polyps   . Hyperlipidemia   . Hypertension   . Internal hemorrhoids   . Lymphocytosis   . Sleep apnea     ALLERGIES:  is allergic to glycopyrrolate.  MEDICATIONS:  Current Outpatient Medications  Medication Sig Dispense Refill  . amLODipine (NORVASC) 5 MG tablet Take 1 tablet by mouth once daily 90 tablet 0  . aspirin 81 MG EC tablet Take 81 mg by mouth daily. Swallow whole.    .  benazepril (LOTENSIN) 20 MG tablet TAKE 1 TABLET BY MOUTH EVERY DAY 90 tablet 1  . carvedilol (COREG) 25 MG tablet TAKE ONE TABLET BY MOUTH TWICE DAILY  180 tablet 0  . doxazosin (CARDURA) 4 MG tablet TAKE ONE TABLET BY MOUTH DAILY AT BEDTIME  90 tablet 0  . DUREZOL 0.05 % EMUL Place 1 drop into the right eye 2 (two) times daily.    Marland Kitchen esomeprazole (NEXIUM) 40 MG capsule Take 40 mg by mouth daily at 12 noon.    Marland Kitchen esomeprazole (NEXIUM) 40 MG capsule TAKE ONE CAPSULE BY MOUTH 30 MINUTES BEFORE BREAKFAST 90 capsule 0  . ferrous sulfate 325 (65 FE) MG EC tablet TAKE 1 TABLET BY MOUTH 3 TIMES A DAY WITH MEALS 90 tablet 0  . glucagon (GLUCAGON EMERGENCY) 1 MG injection Inject 1 mg into the skin once as needed (For severe low blood sugar). 1 each 5  . glucose blood (ONETOUCH VERIO) test strip USE 1 STRIP TO CHECK GLUCOSE THREE TIMES DAILY 100 each 3  . insulin aspart (NOVOLOG FLEXPEN) 100 UNIT/ML FlexPen Inject 3-4 Units into the skin 3 (three) times daily with meals. Inject 3-4 units under the skin three times daily before meals.    . insulin degludec (TRESIBA FLEXTOUCH) 100 UNIT/ML SOPN FlexTouch Pen inject 12 units daily. if fasting blood sugars are over 140 increase dosage up to 15 units daily as directed. (Patient taking differently: 14 Units. inject 14 units daily. if fasting blood sugars are over 140 increase  dosage up to 15 units daily as directed.) 4 pen 4  . metFORMIN (GLUCOPHAGE-XR) 500 MG 24 hr tablet Take 1 tablet (500 mg total) by mouth daily with supper. 90 tablet 3  . pioglitazone (ACTOS) 15 MG tablet TAKE 1 TABLET BY MOUTH EVERY DAY 90 tablet 1  . rosuvastatin (CRESTOR) 5 MG tablet TAKE 1 TABLET BY MOUTH EVERY DAY IN THE MORNING 90 tablet 0  . saccharomyces boulardii (FLORASTOR) 250 MG capsule Take 250 mg by mouth 2 (two) times daily.    . sildenafil (REVATIO) 20 MG tablet TAKE 5 TABLETS BY MOUTH AS NEEDED once a week. 30 tablet 2   No current facility-administered medications for this  visit.    SURGICAL HISTORY:  Past Surgical History:  Procedure Laterality Date  . BLEPHAROPLASTY    . COLONOSCOPY  2012  . HEMORRHOID SURGERY     Sclerotherapy  . KNEE SURGERY Right 2002  . POLYPECTOMY    . SHOULDER SURGERY Right 2007    REVIEW OF SYSTEMS:   Review of Systems  Constitutional: Positive for night sweats. Negative for appetite change, chills, fatigue, fever and unexpected weight change.  HENT:   Negative for mouth sores, nosebleeds, sore throat and trouble swallowing.  Eyes: Negative for eye problems and icterus.  Respiratory: Negative for cough, hemoptysis, shortness of breath and wheezing.   Cardiovascular: Negative for chest pain and leg swelling.  Gastrointestinal: Negative for abdominal pain, constipation, diarrhea, nausea and vomiting.  Genitourinary: Negative for bladder incontinence, difficulty urinating, dysuria, frequency and hematuria.   Musculoskeletal: Negative for back pain, gait problem, neck pain and neck stiffness.  Skin: Negative for itching and rash.  Neurological: Negative for dizziness, extremity weakness, gait problem, headaches, light-headedness and seizures.  Hematological: Positive for left cervical lymphadenopathy. Does not bruise/bleed easily.  Psychiatric/Behavioral: Negative for confusion, depression and sleep disturbance. The patient is not nervous/anxious.     PHYSICAL EXAMINATION:  There were no vitals taken for this visit.  ECOG PERFORMANCE STATUS: 0 - Asymptomatic  Physical Exam  Constitutional: Oriented to person, place, and time and well-developed, well-nourished, and in no distress. HENT:  Head: Normocephalic and atraumatic.  Mouth/Throat: Oropharynx is clear and moist. No oropharyngeal exudate.  Eyes: Conjunctivae are normal. Right eye exhibits no discharge. Left eye exhibits no discharge. No scleral icterus.  Neck: Normal range of motion. Neck supple.  Cardiovascular: Normal rate, regular rhythm, normal heart sounds and  intact distal pulses.   Pulmonary/Chest: Effort normal and breath sounds normal. No respiratory distress. No wheezes. No rales.  Abdominal: Soft. Bowel sounds are normal. Exhibits no distension and no mass. There is no tenderness.  Musculoskeletal: Normal range of motion. Exhibits no edema.  Lymphadenopathy:    Positive for left cervical lymphadenopathy.  Neurological: Alert and oriented to person, place, and time. Exhibits normal muscle tone. Gait normal. Coordination normal.  Skin: Skin is warm and dry. No rash noted. Not diaphoretic. No erythema. No pallor.  Psychiatric: Mood, memory and judgment normal.  Vitals reviewed.  LABORATORY DATA: Lab Results  Component Value Date   WBC 76.7 (HH) 12/29/2019   HGB 9.9 (L) 12/29/2019   HCT 31.7 (L) 12/29/2019   MCV 102.6 (H) 12/29/2019   PLT 267 12/29/2019      Chemistry      Component Value Date/Time   NA 142 12/29/2019 1128   NA 141 08/06/2017 1306   K 4.6 12/29/2019 1128   K 4.3 08/06/2017 1306   CL 107 12/29/2019 1128   CO2 25  12/29/2019 1128   CO2 25 08/06/2017 1306   BUN 19 12/29/2019 1128   BUN 22.9 08/06/2017 1306   CREATININE 1.24 12/29/2019 1128   CREATININE 1.3 08/06/2017 1306      Component Value Date/Time   CALCIUM 8.7 (L) 12/29/2019 1128   CALCIUM 9.3 08/06/2017 1306   ALKPHOS 72 12/29/2019 1128   ALKPHOS 47 08/06/2017 1306   AST 17 12/29/2019 1128   AST 20 08/06/2017 1306   ALT 8 12/29/2019 1128   ALT 19 08/06/2017 1306   BILITOT 0.5 12/29/2019 1128   BILITOT 0.46 08/06/2017 1306       RADIOGRAPHIC STUDIES:  CT Soft Tissue Neck W Contrast  Result Date: 01/05/2020 CLINICAL DATA:  Lymphadenopathy, history of CLL EXAM: CT NECK WITH CONTRAST TECHNIQUE: Multidetector CT imaging of the neck was performed using the standard protocol following the bolus administration of intravenous contrast. CONTRAST:  52mL OMNIPAQUE IOHEXOL 300 MG/ML  SOLN COMPARISON:  None. FINDINGS: Pharynx and larynx: Normal.  No mass or  swelling. Salivary glands: Unremarkable. Thyroid: Unremarkable. Lymph nodes: Skin markers correspond to lymph nodes. Numerous nonenlarged and mildly enlarged lymph nodes are present throughout. Representative examples include a 1.9 cm right supraclavicular node on series 2, image 94; left level 5 node also measuring 1.9 cm (image 51); and right level 2 node measuring 2 cm on image 48. Vascular: Major neck vessels are patent. Calcified plaque is present at the common carotid bifurcations. Limited intracranial: No abnormal enhancement. Visualized orbits: Unremarkable. Mastoids and visualized paranasal sinuses: Minor paranasal sinus mucosal thickening. Mastoids are aerated. Skeleton: Degenerative changes of the cervical spine, greatest at C5-C6. Upper chest: Included upper lungs are clear. Other: None. IMPRESSION: Diffuse adenopathy likely related to history of CLL. Electronically Signed   By: Macy Mis M.D.   On: 01/05/2020 14:46     ASSESSMENT/PLAN:  This is a very pleasant 79 year old Caucasian male diagnosed with chronic lymphocytic leukemia. He was diagnosed in 2014. He has been on observation since that time.   In January 2021, the patient noticed left cervical lymphadenopathy and increase in his total WBC count.   The patient had a CT scan of the neck to further evaluate his condition.   Dr. Julien Nordmann personally and independently reviewed the scan and discussed the results with the patient today. The scan showed diffuse adenopathy likely related to his CLL.   Dr. Julien Nordmann recommended either continual observation vs. Biopsy to confirm the adenopathy is related to his CLL. The patient opted to pursue the biopsy. I will arrange for an ultrasound guided biopsy of the left cervical lymph node.   The patient has a follow up appointment in May 2021. We will keep this appointment and will reach out to the patient sooner if he needs to be seen sooner based on his biopsy results.   The patient was  advised to call immediately if he has any concerning symptoms in the interval. The patient voices understanding of current disease status and treatment options and is in agreement with the current care plan. All questions were answered. The patient knows to call the clinic with any problems, questions or concerns. We can certainly see the patient much sooner if necessary.  No orders of the defined types were placed in this encounter.    Roxan Yamamoto L Emmilee Reamer, PA-C 01/25/20  ADDENDUM: Hematology/Oncology Attending: I had a face-to-face encounter with the patient today.  I recommended his care plan.  This is a very pleasant 79 years old white male  with history of chronic lymphocytic leukemia and has been in observation for several years. The patient noticed a lump in his left side of the neck recently.  I order CT scan of the neck that was performed on January 05, 2020 and the patient is here today for evaluation and recommendation regarding his scan results and condition. The scan showed bilateral neck lymphadenopathy consistent with his current condition of the chronic lymphocytic leukemia.  I discussed with the patient continuous observation and monitoring versus proceeding with ultrasound-guided core biopsy of the left cervical lymph node for confirmation of his diagnosis.  The patient is interested in proceeding with the biopsy and we will arrange for it to be done in the next few weeks.  If the biopsy is consistent with CLL, I will see him back for follow-up visit as previously scheduled in May 2021.  If the biopsy showed any other etiology, we may schedule the patient for an earlier visit for evaluation and recommendation regarding his condition. The patient was advised to call immediately if he has any other concerning symptoms in the interval.  Disclaimer: This note was dictated with voice recognition software. Similar sounding words can inadvertently be transcribed and may be missed upon  review. Eilleen Kempf, MD 01/26/20

## 2020-01-26 ENCOUNTER — Inpatient Hospital Stay: Payer: Medicare HMO | Attending: Internal Medicine | Admitting: Physician Assistant

## 2020-01-26 ENCOUNTER — Encounter: Payer: Self-pay | Admitting: Physician Assistant

## 2020-01-26 ENCOUNTER — Other Ambulatory Visit: Payer: Self-pay

## 2020-01-26 VITALS — BP 140/72 | HR 64 | Temp 98.2°F | Resp 16 | Ht 67.0 in | Wt 153.5 lb

## 2020-01-26 DIAGNOSIS — C9110 Chronic lymphocytic leukemia of B-cell type not having achieved remission: Secondary | ICD-10-CM | POA: Diagnosis not present

## 2020-01-26 DIAGNOSIS — R599 Enlarged lymph nodes, unspecified: Secondary | ICD-10-CM | POA: Insufficient documentation

## 2020-01-27 ENCOUNTER — Encounter (HOSPITAL_COMMUNITY): Payer: Self-pay | Admitting: Radiology

## 2020-01-27 DIAGNOSIS — R69 Illness, unspecified: Secondary | ICD-10-CM | POA: Diagnosis not present

## 2020-01-27 MED ORDER — ROSUVASTATIN CALCIUM 5 MG PO TABS: 5.0000 mg | ORAL_TABLET | Freq: Every day | ORAL | 0 refills | Status: DC

## 2020-01-27 NOTE — Progress Notes (Signed)
Ina R. [facility] Male, 79 y.o., 1941/11/24 MRN:  [id number] Phone:  [phone] (H) PCP:  Elayne Snare, MD Coverage:  [place] [employer] Medicare Hmo/Ppo Next Appt With Internal Medicine 03/20/2020 at 2:00 PM  RE: [place] Core Biopsy (Lymph Nodes) Received: Yesterday Message Contents  Markus Daft, MD  Arlyn Leak for US guided core biopsy of a cervical lymph node. Largest node is along posterior left neck on seq 2, im 52 from CT neck dated 01/05/20.   [place]       Previous Messages   ----- Message -----  From: Garth Bigness D  Sent: 01/26/2020  4:17 PM EST  To: Ir Procedure Requests  Subject: [place] Core Biopsy (Lymph Nodes)           Procedure:  [place] Core Biopsy (Lymph Nodes)   Reason: CLL (chronic lymphocytic leukemia), Left cervical lymph node biopsy to confirm CLL   History: CT in computer   Provider: Gracelyn Nurse   Provider Contact: [phone]

## 2020-02-02 ENCOUNTER — Other Ambulatory Visit: Payer: Self-pay | Admitting: Radiology

## 2020-02-03 ENCOUNTER — Other Ambulatory Visit: Payer: Self-pay | Admitting: Student

## 2020-02-04 ENCOUNTER — Ambulatory Visit (HOSPITAL_COMMUNITY)
Admission: RE | Admit: 2020-02-04 | Discharge: 2020-02-04 | Disposition: A | Discharge status: Home / Self Care | Payer: Medicare HMO | Source: Ambulatory Visit | Attending: Physician Assistant | Admitting: Physician Assistant

## 2020-02-04 ENCOUNTER — Other Ambulatory Visit: Payer: Self-pay

## 2020-02-04 DIAGNOSIS — Z7982 Long term (current) use of aspirin: Secondary | ICD-10-CM | POA: Diagnosis not present

## 2020-02-04 DIAGNOSIS — Z794 Long term (current) use of insulin: Secondary | ICD-10-CM | POA: Diagnosis not present

## 2020-02-04 DIAGNOSIS — Z888 Allergy status to other drugs, medicaments and biological substances status: Secondary | ICD-10-CM | POA: Insufficient documentation

## 2020-02-04 DIAGNOSIS — G4730 Sleep apnea, unspecified: Secondary | ICD-10-CM | POA: Insufficient documentation

## 2020-02-04 DIAGNOSIS — R590 Localized enlarged lymph nodes: Secondary | ICD-10-CM | POA: Diagnosis not present

## 2020-02-04 DIAGNOSIS — I10 Essential (primary) hypertension: Secondary | ICD-10-CM | POA: Diagnosis not present

## 2020-02-04 DIAGNOSIS — E119 Type 2 diabetes mellitus without complications: Secondary | ICD-10-CM | POA: Diagnosis not present

## 2020-02-04 DIAGNOSIS — K219 Gastro-esophageal reflux disease without esophagitis: Secondary | ICD-10-CM | POA: Diagnosis not present

## 2020-02-04 DIAGNOSIS — C9110 Chronic lymphocytic leukemia of B-cell type not having achieved remission: Secondary | ICD-10-CM | POA: Diagnosis not present

## 2020-02-04 DIAGNOSIS — Z79899 Other long term (current) drug therapy: Secondary | ICD-10-CM | POA: Insufficient documentation

## 2020-02-04 DIAGNOSIS — C8301 Small cell B-cell lymphoma, lymph nodes of head, face, and neck: Secondary | ICD-10-CM | POA: Diagnosis not present

## 2020-02-04 DIAGNOSIS — E785 Hyperlipidemia, unspecified: Secondary | ICD-10-CM | POA: Insufficient documentation

## 2020-02-04 DIAGNOSIS — C9111 Chronic lymphocytic leukemia of B-cell type in remission: Secondary | ICD-10-CM | POA: Diagnosis not present

## 2020-02-04 LAB — CBC WITH DIFFERENTIAL/PLATELET
Abs Immature Granulocytes: 0 10*3/uL (ref 0.00–0.07)
Basophils Absolute: 0 10*3/uL (ref 0.0–0.1)
Basophils Relative: 0 %
Eosinophils Absolute: 0 10*3/uL (ref 0.0–0.5)
Eosinophils Relative: 0 %
HCT: 31.6 % — ABNORMAL LOW (ref 39.0–52.0)
Hemoglobin: 10 g/dL — ABNORMAL LOW (ref 13.0–17.0)
Lymphocytes Relative: 95 %
Lymphs Abs: 76.8 10*3/uL — ABNORMAL HIGH (ref 0.7–4.0)
MCH: 32.9 pg (ref 26.0–34.0)
MCHC: 31.6 g/dL (ref 30.0–36.0)
MCV: 103.9 fL — ABNORMAL HIGH (ref 80.0–100.0)
Monocytes Absolute: 1.6 10*3/uL — ABNORMAL HIGH (ref 0.1–1.0)
Monocytes Relative: 2 %
Neutro Abs: 2.4 10*3/uL (ref 1.7–7.7)
Neutrophils Relative %: 3 %
Platelets: 263 10*3/uL (ref 150–400)
RBC: 3.04 MIL/uL — ABNORMAL LOW (ref 4.22–5.81)
RDW: 13.2 % (ref 11.5–15.5)
WBC Morphology: ABNORMAL
WBC: 80.8 10*3/uL (ref 4.0–10.5)
nRBC: 0 % (ref 0.0–0.2)

## 2020-02-04 LAB — PROTIME-INR
INR: 1 (ref 0.8–1.2)
Prothrombin Time: 12.9 seconds (ref 11.4–15.2)

## 2020-02-04 MED ORDER — MIDAZOLAM HCL 2 MG/2ML IJ SOLN
INTRAMUSCULAR | Status: AC
  Filled 2020-02-04: qty 2

## 2020-02-04 MED ORDER — FENTANYL CITRATE (PF) 100 MCG/2ML IJ SOLN
INTRAMUSCULAR | Status: AC
  Filled 2020-02-04: qty 2

## 2020-02-04 MED ORDER — FENTANYL CITRATE (PF) 100 MCG/2ML IJ SOLN
INTRAMUSCULAR | Status: AC | PRN
  Administered 2020-02-04 (×2): 50 ug via INTRAVENOUS

## 2020-02-04 MED ORDER — SODIUM CHLORIDE 0.9 % IV SOLN: INTRAVENOUS | Status: DC

## 2020-02-04 MED ORDER — LIDOCAINE-EPINEPHRINE (PF) 2 %-1:200000 IJ SOLN
INTRAMUSCULAR | Status: AC
  Filled 2020-02-04: qty 20

## 2020-02-04 MED ORDER — MIDAZOLAM HCL 2 MG/2ML IJ SOLN
INTRAMUSCULAR | Status: AC | PRN
  Administered 2020-02-04 (×2): 1 mg via INTRAVENOUS

## 2020-02-04 MED ORDER — LIDOCAINE HCL (PF) 1 % IJ SOLN
INTRAMUSCULAR | Status: AC | PRN
  Administered 2020-02-04: 5 mL via INTRADERMAL

## 2020-02-04 NOTE — Discharge Instructions (Signed)
Urgent needs - On call IR MD [phone] Wound - May remove dressing in 24 hours and shower, Keep site clean and dry. Replace with clean bandaid as necessary.  Moderate Conscious Sedation, Adult, Care After These instructions provide you with information about caring for yourself after your procedure. Your health care provider may also give you more specific instructions. Your treatment has been planned according to current medical practices, but problems sometimes occur. Call your health care provider if you have any problems or questions after your procedure. What can I expect after the procedure? After your procedure, it is common:  To feel sleepy for several hours.  To feel clumsy and have poor balance for several hours.  To have poor judgment for several hours.  To vomit if you eat too soon. Follow these instructions at home: For at least 24 hours after the procedure:  Do not: ? Participate in activities where you could fall or become injured. ? Drive. ? Use heavy machinery. ? Drink alcohol. ? Take sleeping pills or medicines that cause drowsiness. ? Make important decisions or sign legal documents. ? Take care of children on your own.  Rest. Eating and drinking  Follow the diet recommended by your health care provider.  If you vomit: ? Drink water, juice, or soup when you can drink without vomiting. ? Make sure you have little or no nausea before eating solid foods. General instructions  Have a responsible adult stay with you until you are awake and alert.  Take over-the-counter and prescription medicines only as told by your health care provider.  If you smoke, do not smoke without supervision.  Keep all follow-up visits as told by your health care provider. This is important. Contact a health care provider if:  You keep feeling nauseous or you keep vomiting.  You feel light-headed.  You develop a rash.  You have a fever. Get help right away if:  You  have trouble breathing. This information is not intended to replace advice given to you by your health care provider. Make sure you discuss any questions you have with your health care provider. Document Revised: 11/14/2017 Document Reviewed: 03/23/2016 Elsevier Patient Education  [employer].  Needle Biopsy, Care After These instructions tell you how to care for yourself after your procedure. Your doctor may also give you more specific instructions. Call your doctor if you have any problems or questions. What can I expect after the procedure? After the procedure, it is common to have:  Soreness.  Bruising.  Mild pain. Follow these instructions at home:  Return to your normal activities as told by your doctor. Ask your doctor what activities are safe for you.  Take over-the-counter and prescription medicines only as told by your doctor.  Wash your hands with soap and water before you change your bandage (dressing). If you cannot use soap and water, use hand sanitizer.  Follow instructions from your doctor about: ? How to take care of your puncture site. ? When and how to change your bandage. ? When to remove your bandage.  Check your puncture site every day for signs of infection. Watch for: ? Redness, swelling, or pain. ? Fluid or blood. ? Pus or a bad smell. ? Warmth.  Do not take baths, swim, or use a hot tub until your doctor approves. Ask your doctor if you may take showers. You may only be allowed to take sponge baths.  Keep all follow-up visits as told by your doctor. This is  important. Contact a doctor if you have:  A fever.  Redness, swelling, or pain at the puncture site, and it lasts longer than a few days.  Fluid, blood, or pus coming from the puncture site.  Warmth coming from the puncture site. Get help right away if:  You have a lot of bleeding from the puncture site. Summary  After the procedure, it is common to have soreness, bruising, or  mild pain at the puncture site.  Check your puncture site every day for signs of infection, such as redness, swelling, or pain.  Get help right away if you have severe bleeding from your puncture site. This information is not intended to replace advice given to you by your health care provider. Make sure you discuss any questions you have with your health care provider. Document Revised: 12/15/2017 Document Reviewed: 12/15/2017 Elsevier Patient Education  2020 [employer].

## 2020-02-04 NOTE — Procedures (Signed)
Pre Procedure Dx: Cervical lymphadenopathy Post Procedural Dx: Same  Technically successful US guided biopsy of dominant palpable left cervical lymph node.  EBL: None  No immediate complications.   Ronny Bacon, MD Pager #: [phone]

## 2020-02-04 NOTE — Consult Note (Signed)
Chief Complaint: Patient was seen in consultation today for image guided left cervical lymph node biopsy  Referring Physician(s): Heilingoetter,Cassandra L/Mohamed,M  Supervising Physician: Sandi Mariscal  Patient Status: [facility] - Out-pt  History of Present Illness: Tony Marshall is a 79 y.o. male with history of CLL, initially diagnosed in 2014, and under observation only.  Last month he noticed an enlarging left posterior cervical lymph node.  CT neck performed on 01/05/2020 revealed diffuse adenopathy.  He presents today for left cervical lymph node biopsy for further evaluation. Additional medical hx as below.   Past Medical History:  Diagnosis Date  . CLL (chronic lymphocytic leukemia) ([place])   . Diabetes mellitus without complication ([place])   . Diverticulosis   . External hemorrhoids   . GERD (gastroesophageal reflux disease)   . History of colon polyps   . Hyperlipidemia   . Hypertension   . Internal hemorrhoids   . Lymphocytosis   . Sleep apnea     Past Surgical History:  Procedure Laterality Date  . BLEPHAROPLASTY    . COLONOSCOPY  2012  . HEMORRHOID SURGERY     Sclerotherapy  . KNEE SURGERY Right 2002  . POLYPECTOMY    . SHOULDER SURGERY Right 2007    Allergies: Glycopyrrolate  Medications: Prior to Admission medications   Medication Sig Start Date End Date Taking? Authorizing Provider  amLODipine (NORVASC) 5 MG tablet Take 1 tablet by mouth once daily 01/03/20   Elayne Snare, MD  aspirin 81 MG EC tablet Take 81 mg by mouth daily. Swallow whole.    [provider]  benazepril (LOTENSIN) 20 MG tablet TAKE 1 TABLET BY MOUTH EVERY DAY 09/16/19   Elayne Snare, MD  carvedilol (COREG) 25 MG tablet TAKE ONE TABLET BY MOUTH TWICE DAILY  11/08/19   Elayne Snare, MD  doxazosin (CARDURA) 4 MG tablet TAKE ONE TABLET BY MOUTH DAILY AT BEDTIME  11/08/19   Elayne Snare, MD  esomeprazole (NEXIUM) 40 MG capsule Take 40 mg by mouth daily at 12 noon.    [provider]  esomeprazole (NEXIUM) 40 MG capsule TAKE ONE CAPSULE BY MOUTH 30 MINUTES BEFORE BREAKFAST 01/13/20   Pyrtle, Lajuan Lines, MD  ferrous sulfate 325 (65 FE) MG EC tablet TAKE 1 TABLET BY MOUTH 3 TIMES A DAY WITH MEALS 12/28/19   Curt Bears, MD  glucagon (GLUCAGON EMERGENCY) 1 MG injection Inject 1 mg into the skin once as needed (For severe low blood sugar). 06/14/19   Elayne Snare, MD  glucose blood (ONETOUCH VERIO) test strip USE 1 STRIP TO CHECK GLUCOSE THREE TIMES DAILY 10/15/18   Elayne Snare, MD  insulin aspart (NOVOLOG FLEXPEN) 100 UNIT/ML FlexPen Inject 3-4 Units into the skin 3 (three) times daily with meals. Inject 3-4 units under the skin three times daily before meals.    [provider]  insulin degludec (TRESIBA FLEXTOUCH) 100 UNIT/ML SOPN FlexTouch Pen inject 12 units daily. if fasting blood sugars are over 140 increase dosage up to 15 units daily as directed. Patient taking differently: 14 Units. inject 14 units daily. if fasting blood sugars are over 140 increase dosage up to 15 units daily as directed. 03/22/19   Elayne Snare, MD  metFORMIN (GLUCOPHAGE-XR) 500 MG 24 hr tablet Take 1 tablet (500 mg total) by mouth daily with supper. 09/16/19   Elayne Snare, MD  pioglitazone (ACTOS) 15 MG tablet TAKE 1 TABLET BY MOUTH EVERY DAY 09/16/19   Elayne Snare, MD  rosuvastatin (CRESTOR) 5 MG tablet  Take 1 tablet (5 mg total) by mouth daily. 01/27/20   Elayne Snare, MD  saccharomyces boulardii (FLORASTOR) 250 MG capsule Take 250 mg by mouth 2 (two) times daily.    [provider]  sildenafil (REVATIO) 20 MG tablet TAKE 5 TABLETS BY MOUTH AS NEEDED once a week. 04/16/16   Elayne Snare, MD     Family History  Problem Relation Age of Onset  . Kidney disease Mother   . CAD Maternal Aunt   . Colon cancer Neg Hx   . Esophageal cancer Neg Hx   . Rectal cancer Neg Hx   . Stomach cancer Neg Hx     Social History   Socioeconomic History  . Marital status: Married    Spouse  name: Not on file  . Number of children: 1  . Years of education: Not on file  . Highest education level: Not on file  Occupational History  . Occupation: Retail buyer: [employer]  Tobacco Use  . Smoking status: Never Smoker  . Smokeless tobacco: Former Systems developer    Types: Chew  Substance and Sexual Activity  . Alcohol use: Yes    Alcohol/week: 6.0 standard drinks    Types: 6 Cans of beer per week  . Drug use: No  . Sexual activity: Yes  Other Topics Concern  . Not on file  Social History Narrative  . Not on file   Social Determinants of Health   Financial Resource Strain:   . Difficulty of Paying Living Expenses: Not on file  Food Insecurity:   . Worried About Charity fundraiser in the Last Year: Not on file  . Ran Out of Food in the Last Year: Not on file  Transportation Needs:   . Lack of Transportation (Medical): Not on file  . Lack of Transportation (Non-Medical): Not on file  Physical Activity:   . Days of Exercise per Week: Not on file  . Minutes of Exercise per Session: Not on file  Stress:   . Feeling of Stress : Not on file  Social Connections:   . Frequency of Communication with Friends and Family: Not on file  . Frequency of Social Gatherings with Friends and Family: Not on file  . Attends Religious Services: Not on file  . Active Member of Clubs or Organizations: Not on file  . Attends Archivist Meetings: Not on file  . Marital Status: Not on file      Review of Systems currently denies fever, headache, chest pain, dyspnea, cough, abdominal pain, nausea, vomiting or bleeding. He does have occ back pain.   Vital Signs: Blood pressure 149/77, heart rate 62, temp 98.9, respirations 16, O2 sat 99% room air   Physical Exam awake, alert.  Chest clear to auscultation bilaterally.  Heart with regular rate and rhythm.  Abdomen soft, positive bowel sounds, nontender.  No lower extremity edema.  Palpable left posterior cervical  lymph node.  Imaging: CT Soft Tissue Neck W Contrast  Result Date: 01/05/2020 CLINICAL DATA:  Lymphadenopathy, history of CLL EXAM: CT NECK WITH CONTRAST TECHNIQUE: Multidetector CT imaging of the neck was performed using the standard protocol following the bolus administration of intravenous contrast. CONTRAST:  2mL OMNIPAQUE IOHEXOL 300 MG/ML  SOLN COMPARISON:  None. FINDINGS: Pharynx and larynx: Normal.  No mass or swelling. Salivary glands: Unremarkable. Thyroid: Unremarkable. Lymph nodes: Skin markers correspond to lymph nodes. Numerous nonenlarged and mildly enlarged lymph nodes are present throughout. Representative examples  include a 1.9 cm right supraclavicular node on series 2, image 94; left level 5 node also measuring 1.9 cm (image 51); and right level 2 node measuring 2 cm on image 48. Vascular: Major neck vessels are patent. Calcified plaque is present at the common carotid bifurcations. Limited intracranial: No abnormal enhancement. Visualized orbits: Unremarkable. Mastoids and visualized paranasal sinuses: Minor paranasal sinus mucosal thickening. Mastoids are aerated. Skeleton: Degenerative changes of the cervical spine, greatest at C5-C6. Upper chest: Included upper lungs are clear. Other: None. IMPRESSION: Diffuse adenopathy likely related to history of CLL. Electronically Signed   By: Macy Mis M.D.   On: 01/05/2020 14:46    Labs:  CBC: Recent Labs    04/30/19 1202 06/11/19 1033 11/01/19 1518 12/29/19 1128  WBC 68.6 Repeated and verified X2.* 80.9 Repeated and verified X2.* 54.3* 76.7*  HGB 11.9* 12.1* 11.0* 9.9*  HCT 36.3* 38.7* 35.0* 31.7*  PLT 203.0 227.0 236 267    COAGS: Recent Labs    02/04/20 1130  INR 1.0    BMP: Recent Labs    04/26/19 1344 06/11/19 1033 09/13/19 1417 11/01/19 1518 12/20/19 1501 12/29/19 1128  NA 141   < > 139 143 142 142  K 4.4   < > 4.2 4.3 4.0 4.6  CL 105   < > 104 107 103 107  CO2 29   < > 30 26 31 25   GLUCOSE 253*   <  > 194* 166* 219* 209*  BUN 21   < > 10 16 18 19   CALCIUM 9.1   < > 9.0 8.8* 8.9 8.7*  CREATININE 1.45*   < > 1.14 1.22 1.13 1.24  GFRNONAA 47*  --   --  58*  --  56*  GFRAA 54*  --   --  >60  --  >60   < > = values in this interval not displayed.    LIVER FUNCTION TESTS: Recent Labs    09/13/19 1417 11/01/19 1518 12/20/19 1501 12/29/19 1128  BILITOT 0.5 0.4 0.5 0.5  AST 20 19 17 17   ALT 11 13 10 8   ALKPHOS 42 56 62 72  PROT 5.9* 6.2* 6.0 6.1*  ALBUMIN 3.8 3.8 3.9 3.7    TUMOR MARKERS: No results for input(s): AFPTM, CEA, CA199, CHROMGRNA in the last 8760 hours.  Assessment and Plan: 79 y.o. male with history of CLL, initially diagnosed in 2014, and under observation only.  Last month he noticed an enlarging left posterior cervical lymph node.  CT neck performed on 01/05/2020 revealed diffuse adenopathy.  He presents today for left cervical lymph node biopsy for further evaluation.Risks and benefits of procedure was discussed with the patient  including, but not limited to bleeding, infection, damage to adjacent structures or low yield requiring additional tests.  All of the questions were answered and there is agreement to proceed.  Consent signed and in chart.     Thank you for this interesting consult.  I greatly enjoyed meeting Tony Marshall and look forward to participating in their care.  A copy of this report was sent to the requesting provider on this date.  Electronically Signed: D. Rowe Robert, PA-C 02/04/2020, 12:14 PM   I spent a total of 25 minutes    in face to face in clinical consultation, greater than 50% of which was counseling/coordinating care for image guided left cervical lymph node biopsy

## 2020-02-08 LAB — GLUCOSE, CAPILLARY: Glucose-Capillary: 137 mg/dL — ABNORMAL HIGH (ref 70–99)

## 2020-02-08 LAB — SURGICAL PATHOLOGY

## 2020-02-10 ENCOUNTER — Telehealth: Payer: Self-pay | Admitting: Medical Oncology

## 2020-02-10 ENCOUNTER — Other Ambulatory Visit: Payer: Self-pay | Admitting: Endocrinology

## 2020-02-10 ENCOUNTER — Other Ambulatory Visit: Payer: Self-pay

## 2020-02-10 MED ORDER — DOXAZOSIN MESYLATE 4 MG PO TABS: 4.0000 mg | ORAL_TABLET | Freq: Every day | ORAL | 0 refills | Status: DC

## 2020-02-10 MED ORDER — CARVEDILOL 25 MG PO TABS: 25.0000 mg | ORAL_TABLET | Freq: Two times a day (BID) | ORAL | 0 refills | Status: DC

## 2020-02-10 NOTE — Telephone Encounter (Signed)
Wants biopsy results -next appt may.

## 2020-02-10 NOTE — Telephone Encounter (Signed)
CLL

## 2020-02-11 NOTE — Telephone Encounter (Signed)
Pt.notified

## 2020-02-24 ENCOUNTER — Other Ambulatory Visit: Payer: Self-pay | Admitting: Endocrinology

## 2020-02-24 DIAGNOSIS — R69 Illness, unspecified: Secondary | ICD-10-CM | POA: Diagnosis not present

## 2020-02-24 DIAGNOSIS — Z794 Long term (current) use of insulin: Secondary | ICD-10-CM

## 2020-02-24 DIAGNOSIS — E1165 Type 2 diabetes mellitus with hyperglycemia: Secondary | ICD-10-CM

## 2020-03-15 ENCOUNTER — Encounter: Payer: Self-pay | Admitting: *Deleted

## 2020-03-20 ENCOUNTER — Other Ambulatory Visit (INDEPENDENT_AMBULATORY_CARE_PROVIDER_SITE_OTHER): Payer: Medicare HMO

## 2020-03-20 ENCOUNTER — Other Ambulatory Visit: Payer: Self-pay

## 2020-03-20 ENCOUNTER — Telehealth: Payer: Self-pay

## 2020-03-20 DIAGNOSIS — Z794 Long term (current) use of insulin: Secondary | ICD-10-CM

## 2020-03-20 DIAGNOSIS — R5383 Other fatigue: Secondary | ICD-10-CM | POA: Diagnosis not present

## 2020-03-20 DIAGNOSIS — E1165 Type 2 diabetes mellitus with hyperglycemia: Secondary | ICD-10-CM

## 2020-03-20 DIAGNOSIS — E538 Deficiency of other specified B group vitamins: Secondary | ICD-10-CM

## 2020-03-20 LAB — HEMOGLOBIN A1C: Hgb A1c MFr Bld: 4.7 % (ref 4.6–6.5)

## 2020-03-20 LAB — COMPREHENSIVE METABOLIC PANEL
ALT: 9 U/L (ref 0–53)
AST: 20 U/L (ref 0–37)
Albumin: 4.2 g/dL (ref 3.5–5.2)
Alkaline Phosphatase: 72 U/L (ref 39–117)
BUN: 16 mg/dL (ref 6–23)
CO2: 28 mEq/L (ref 19–32)
Calcium: 9.2 mg/dL (ref 8.4–10.5)
Chloride: 107 mEq/L (ref 96–112)
Creatinine, Ser: 1.17 mg/dL (ref 0.40–1.50)
GFR: 60.56 mL/min (ref >60.00)
Glucose, Bld: 43 mg/dL — CL (ref 70–99)
Potassium: 4 mEq/L (ref 3.5–5.1)
Sodium: 142 mEq/L (ref 135–145)
Total Bilirubin: 0.9 mg/dL (ref 0.2–1.2)
Total Protein: 6.3 g/dL (ref 6.0–8.3)

## 2020-03-20 LAB — LIPID PANEL
Cholesterol: 116 mg/dL (ref 0–200)
HDL: 26.2 mg/dL — ABNORMAL LOW (ref >39.00)
LDL Cholesterol: 71 mg/dL (ref 0–99)
NonHDL: 90.16
Total CHOL/HDL Ratio: 4
Triglycerides: 97 mg/dL (ref 0.0–149.0)
VLDL: 19.4 mg/dL (ref 0.0–40.0)

## 2020-03-20 LAB — TSH: TSH: 1.14 u[IU]/mL (ref 0.35–4.50)

## 2020-03-20 LAB — T4, FREE: Free T4: 0.92 ng/dL (ref 0.60–1.60)

## 2020-03-20 LAB — VITAMIN B12: Vitamin B-12: 191 pg/mL — ABNORMAL LOW (ref 211–911)

## 2020-03-20 NOTE — Telephone Encounter (Signed)
Called pt and spoke with pt's wife about critically low blood sugar.  Pt's wife stated that the pt is okay, and he knew his blood sugar was low and he ate something when he got back to his car.

## 2020-03-21 ENCOUNTER — Other Ambulatory Visit: Payer: Self-pay | Admitting: Endocrinology

## 2020-03-23 ENCOUNTER — Ambulatory Visit (INDEPENDENT_AMBULATORY_CARE_PROVIDER_SITE_OTHER): Payer: Medicare HMO | Admitting: Endocrinology

## 2020-03-23 ENCOUNTER — Ambulatory Visit: Payer: Medicare HMO | Admitting: Internal Medicine

## 2020-03-23 ENCOUNTER — Encounter: Payer: Self-pay | Admitting: Internal Medicine

## 2020-03-23 ENCOUNTER — Encounter: Payer: Self-pay | Admitting: Endocrinology

## 2020-03-23 ENCOUNTER — Other Ambulatory Visit (INDEPENDENT_AMBULATORY_CARE_PROVIDER_SITE_OTHER): Payer: Medicare HMO

## 2020-03-23 ENCOUNTER — Other Ambulatory Visit: Payer: Self-pay

## 2020-03-23 VITALS — BP 122/60 | HR 62 | Ht 67.5 in | Wt 152.8 lb

## 2020-03-23 VITALS — BP 114/62 | HR 63 | Temp 97.4°F | Ht 67.5 in | Wt 152.0 lb

## 2020-03-23 DIAGNOSIS — R630 Anorexia: Secondary | ICD-10-CM

## 2020-03-23 DIAGNOSIS — C9110 Chronic lymphocytic leukemia of B-cell type not having achieved remission: Secondary | ICD-10-CM | POA: Diagnosis not present

## 2020-03-23 DIAGNOSIS — R634 Abnormal weight loss: Secondary | ICD-10-CM

## 2020-03-23 DIAGNOSIS — E1165 Type 2 diabetes mellitus with hyperglycemia: Secondary | ICD-10-CM | POA: Diagnosis not present

## 2020-03-23 DIAGNOSIS — K219 Gastro-esophageal reflux disease without esophagitis: Secondary | ICD-10-CM

## 2020-03-23 DIAGNOSIS — E7800 Pure hypercholesterolemia, unspecified: Secondary | ICD-10-CM | POA: Diagnosis not present

## 2020-03-23 DIAGNOSIS — I10 Essential (primary) hypertension: Secondary | ICD-10-CM | POA: Diagnosis not present

## 2020-03-23 DIAGNOSIS — Z794 Long term (current) use of insulin: Secondary | ICD-10-CM

## 2020-03-23 LAB — CBC WITH DIFFERENTIAL/PLATELET
Basophils Absolute: 0.1 10*3/uL (ref 0.0–0.1)
Basophils Relative: 0.1 % (ref 0.0–3.0)
Eosinophils Absolute: 0.3 10*3/uL (ref 0.0–0.7)
Eosinophils Relative: 0.3 % (ref 0.0–5.0)
HCT: 25.9 % — ABNORMAL LOW (ref 39.0–52.0)
Hemoglobin: 8.1 g/dL — ABNORMAL LOW (ref 13.0–17.0)
Lymphocytes Relative: 94 % — ABNORMAL HIGH (ref 12.0–46.0)
Lymphs Abs: 94.4 10*3/uL — ABNORMAL HIGH (ref 0.7–4.0)
MCHC: 31.4 g/dL (ref 30.0–36.0)
MCV: 107.6 fl — ABNORMAL HIGH (ref 78.0–100.0)
Monocytes Absolute: 1.6 10*3/uL — ABNORMAL HIGH (ref 0.1–1.0)
Monocytes Relative: 1.6 % — ABNORMAL LOW (ref 3.0–12.0)
Neutro Abs: 4 10*3/uL (ref 1.4–7.7)
Neutrophils Relative %: 4 % — ABNORMAL LOW (ref 43.0–77.0)
Platelets: 269 10*3/uL (ref 150.0–400.0)
RBC: 2.4 Mil/uL — ABNORMAL LOW (ref 4.22–5.81)
RDW: 13.4 % (ref 11.5–15.5)
WBC: 100.4 10*3/uL (ref 4.0–10.5)

## 2020-03-23 LAB — COMPREHENSIVE METABOLIC PANEL
ALT: 7 U/L (ref 0–53)
AST: 15 U/L (ref 0–37)
Albumin: 4.2 g/dL (ref 3.5–5.2)
Alkaline Phosphatase: 72 U/L (ref 39–117)
BUN: 14 mg/dL (ref 6–23)
CO2: 28 mEq/L (ref 19–32)
Calcium: 9.2 mg/dL (ref 8.4–10.5)
Chloride: 106 mEq/L (ref 96–112)
Creatinine, Ser: 1.18 mg/dL (ref 0.40–1.50)
GFR: 59.96 mL/min — ABNORMAL LOW (ref >60.00)
Glucose, Bld: 136 mg/dL — ABNORMAL HIGH (ref 70–99)
Potassium: 4.5 mEq/L (ref 3.5–5.1)
Sodium: 141 mEq/L (ref 135–145)
Total Bilirubin: 0.9 mg/dL (ref 0.2–1.2)
Total Protein: 6.2 g/dL (ref 6.0–8.3)

## 2020-03-23 LAB — IBC + FERRITIN
Ferritin: 96.5 ng/mL (ref 22.0–322.0)
Iron: 94 ug/dL (ref 42–165)
Saturation Ratios: 30.5 % (ref 20.0–50.0)
Transferrin: 220 mg/dL (ref 212.0–360.0)

## 2020-03-23 NOTE — Progress Notes (Signed)
Subjective:    Patient ID: Tony Marshall, male    DOB: [birth year], 79 y.o.   MRN: [id number]  HPI Tony Marshall is a 79 year old male with a past medical history of GERD with hiatal hernia and Schatzki's ring, gastritis not related to H. pylori, remote colon polyps though none at last colonoscopy in 2017, severe colonic diverticulosis, CLL who is seen for follow-up.  He is here with his wife today.  I last saw him in October 2020 to evaluate epigastric pain, mixed picture anemia and dysphagia.Marland Kitchen  EGD was performed several days later which showed Schatzki's ring dilated to 18 mm with balloon, 3 to 4 cm hiatal hernia and diffuse moderate inflammation in the gastric fundus, body and antrum.  Biopsies showed chronic inactive gastritis without H. Pylori.  Today he reports that he continues to have a poor appetite and so he returns to clinic.  Food is just not appetizing and he feels somewhat averse to eating.  He does not really have pain with eating though he has had prior epigastric pain.  He was taking Nexium after his endoscopy but for the last several months has not been taking this.  He does not have an early fullness just really not a normal appetite.  Taste is also changed.  He is using oral iron previously 3 times a day but he stopped the at night dose due to intestinal gas and flatulence.  Energy levels are so-so.  He has continued Florastor to 50 mg twice daily which he has found helpful.  Bowel movements are mostly regular and he will use Colace if he feels constipated.  No visible blood in his stool or melena.  He follows with Dr. Julien Nordmann for his CLL.  He had a CT scan of his neck in January which showed diffuse adenopathy.  Biopsies confirmed this to be related to CLL.  His last blood work was in mid February where he had a white count of 80,000, hemoglobin 10.0, platelets were normal at 263  He had Covid vaccine without issue.  He reports his weight being down 5 to 8 pounds for him  and by our scales he is down 5 pounds since October 2020  Review of Systems As per HPI, otherwise negative  Current Medications, Allergies, Past Medical History, Past Surgical History, Family History and Social History were reviewed in [employer] [employer] record.     Objective:   Physical Exam BP 114/62   Pulse 63   Temp (!) 97.4 F (36.3 C)   Ht 5' 7.5" (1.715 m)   Wt 152 lb (68.9 kg)   SpO2 98%   BMI 23.46 kg/m  Gen: awake, alert, NAD, pale appearing HEENT: anicteric, pale conjunctiva CV: RRR, no mrg Pulm: CTA b/l Abd: soft, NT/ND, +BS throughout Ext: no c/c/e Neuro: nonfocal  CBC    Component Value Date/Time   WBC 80.8 (HH) 02/04/2020 1130   RBC 3.04 (L) 02/04/2020 1130   HGB 10.0 (L) 02/04/2020 1130   HGB 9.9 (L) 12/29/2019 1128   HGB 13.2 11/05/2017 1308   HCT 31.6 (L) 02/04/2020 1130   HCT 40.8 11/05/2017 1308   PLT 263 02/04/2020 1130   PLT 267 12/29/2019 1128   PLT 205 11/05/2017 1308   MCV 103.9 (H) 02/04/2020 1130   MCV 95.8 11/05/2017 1308   MCH 32.9 02/04/2020 1130   MCHC 31.6 02/04/2020 1130   RDW 13.2 02/04/2020 1130   RDW 14.0 11/05/2017 1308   LYMPHSABS 76.8 (H)  02/04/2020 1130   LYMPHSABS 66.1 (H) 11/05/2017 1308   MONOABS 1.6 (H) 02/04/2020 1130   MONOABS 1.4 (H) 11/05/2017 1308   EOSABS 0.0 02/04/2020 1130   EOSABS 0.3 11/05/2017 1308   BASOSABS 0.0 02/04/2020 1130   BASOSABS 0.2 (H) 11/05/2017 1308   CMP     Component Value Date/Time   NA 142 03/20/2020 1403   NA 141 08/06/2017 1306   K 4.0 03/20/2020 1403   K 4.3 08/06/2017 1306   CL 107 03/20/2020 1403   CO2 28 03/20/2020 1403   CO2 25 08/06/2017 1306   GLUCOSE 43 (LL) 03/20/2020 1403   GLUCOSE 150 (H) 08/06/2017 1306   BUN 16 03/20/2020 1403   BUN 22.9 08/06/2017 1306   CREATININE 1.17 03/20/2020 1403   CREATININE 1.24 12/29/2019 1128   CREATININE 1.3 08/06/2017 1306   CALCIUM 9.2 03/20/2020 1403   CALCIUM 9.3 08/06/2017 1306   PROT 6.3 03/20/2020 1403    PROT 6.5 08/06/2017 1306   ALBUMIN 4.2 03/20/2020 1403   ALBUMIN 3.8 08/06/2017 1306   AST 20 03/20/2020 1403   AST 17 12/29/2019 1128   AST 20 08/06/2017 1306   ALT 9 03/20/2020 1403   ALT 8 12/29/2019 1128   ALT 19 08/06/2017 1306   ALKPHOS 72 03/20/2020 1403   ALKPHOS 47 08/06/2017 1306   BILITOT 0.9 03/20/2020 1403   BILITOT 0.5 12/29/2019 1128   BILITOT 0.46 08/06/2017 1306   GFRNONAA 56 (L) 12/29/2019 1128   GFRAA >60 12/29/2019 1128        Assessment & Plan:   79 year old male with a past medical history of GERD with hiatal hernia and Schatzki's ring, gastritis not related to H. pylori, remote colon polyps though none at last colonoscopy in 2017, severe colonic diverticulosis, CLL who is seen for follow-up  1.  Poor appetite/weight loss --he is having poor appetite and mild weight loss.  No real abdominal pain of late.  He has been not taking Nexium daily and using it more as needed.  Nexium did not seem to help appetite.  He had chronic inactive gastritis without H. pylori.  He is not having dysphagia after dilation in October for a Schatzki's ring associated with his hiatal hernia.  He is up-to-date with colonoscopy.  I think that his poor appetite and weight loss may be directly related to his CLL but I have recommended cross-sectional imaging of the abdomen and pelvis with contrast to evaluate for abdominal adenopathy but also other pathology.  I am going to also repeat blood counts today because he appears more pale to me than usual.  The oral iron may be affecting his appetite somewhat?  Could consider IV iron. --CBC, CMP, ferritin plus IBC panel --CT scan of the abdomen pelvis with contrast  2.  GERD/inactive gastritis/diverticulosis --he is only using Nexium as needed which I feel this okay.  Pepcid may be a better choice for as-needed basis than PPI.  He will continue Florastor twice a day which is helping with bowel regularity, gas and bloating.  3. CLL --following with  Dr. Julien Nordmann  30 minutes total spent today including patient facing time, coordination of care, reviewing medical history/procedures/pertinent radiology studies, and documentation of the encounter.

## 2020-03-23 NOTE — Progress Notes (Signed)
Patient ID: Tony Marshall, male   DOB: 05-19-1941, 79 y.o.   MRN: [id number]   Reason for Appointment:  follow-up of various conditions  History of Present Illness   1. Type 2 DIABETES MELITUS, date of diagnosis:  1992        His blood sugar has been  difficult to control over the last few years because of variability in his blood sugars On basal bolus insulin for a few years and his oral hypoglycemic drugs have been continued Also did not tolerate GLP-1 drugs or symlin because of nausea He is essentially looking like a a type I patient with requiring relatively low amounts of insulin and twice a day dosage of basal insulin He has done somewhat better with Levemir compared to Lantus and is using this twice a day also  He also had overall much better controlled with adding Invokana in 07/2016  Recent history:  Insulin regimen: insulin TRESIBA 12 units am.  Mealtime insulin: Breakfast = 2-3 units, lunch 2-3 units supper 2-4 units based on meal size  Oral hypoglycemic drugs: Actos, Metformin 599m daily  His A1c is now 4.7 and falsely low, previously was better at 6.7 compared to 7.5  Recent blood sugar patterns and problems:  He has had decreased appetite more recently and has had only smaller portions usually  He appears to have reduced his mealtime insulin especially when he is eating smaller meals  He had a low sugar in the lab because he had not eaten all day since breakfast although he did not take any Humalog insulin before coming in  As before he is checking blood sugar somewhat infrequently and not consistently on waking up  He says he was having some low sugars overnight and has cut back his [place] 2 units  Also this morning his sugar was 56 waking up and last Sunday was down to 34 before dinnertime  Will however have blood sugar monitoring mostly at dinnertime and not after meals  His blood sugars have been sporadically high at dinnertime and once  in the morning also  He has had more fatigue and not exercising  Recently weight is about the same  Lab glucose was 43    Hypoglycemia:  as above   Side effects from medications:  Diarrhea from metformin, weight loss from Invokana       Proper timing of medications in relation to meals: Yes.           Has late breakfast near 12 noon, dinner at 7-8 PM   Monitors blood glucose:  About once or twice a day    Glucometer: One Touch Verio.           Blood Glucose readings from meter download   PRE-MEAL Fasting Lunch Dinner Bedtime Overall  Glucose range:  56-195   34-237    Mean/median:  119   146   133   POST-MEAL PC Breakfast PC Lunch PC Dinner  Glucose range:    94, 174  Mean/median:       Previous readings:  PRE-MEAL Fasting  midday  3-5 PM  7-10 PM Overall  Glucose range:  116  102-178  53, 145  51-199   Mean/median:     12 3     Wt Readings from Last 3 Encounters:  03/23/20 152 lb 12.8 oz (69.3 kg)  03/23/20 152 lb (68.9 kg)  01/26/20 153 lb 8 oz (69.6 kg)  LABS:  Lab Results  Component Value Date   HGBA1C 4.7 03/20/2020   HGBA1C 6.7 (H) 12/20/2019   HGBA1C 7.5 (H) 09/13/2019   Lab Results  Component Value Date   MICROALBUR 2.7 (H) 06/11/2019   LDLCALC 71 03/20/2020   CREATININE 1.18 03/23/2020    2. Hypertension:    His blood pressure has been high for over 30 years and previously difficult to control. He was evaluated in 1/08 with urine metanephrines and renal artery ultrasound.    Current medication regimen: Amlodipine 5 mg, benazepril 20 mg, carvedilol 25 mg twice daily and doxazosin 4 mg Blood pressure at home usually around 120-130 Occasionally may feel a little dizzy but not on standing up  BP Readings from Last 3 Encounters:  03/23/20 122/60  03/23/20 114/62  02/04/20 (!) 149/77      Other ACTIVE problems: See review of systems  LABS:  Appointment on 03/23/2020  Component Date Value Ref Range Status  . Iron 03/23/2020 94   42 - 165 ug/dL Final  . Transferrin 03/23/2020 220.0  212.0 - 360.0 mg/dL Final  . Saturation Ratios 03/23/2020 30.5  20.0 - 50.0 % Final  . Ferritin 03/23/2020 96.5  22.0 - 322.0 ng/mL Final  . Sodium 03/23/2020 141  135 - 145 mEq/L Final  . Potassium 03/23/2020 4.5  3.5 - 5.1 mEq/L Final  . Chloride 03/23/2020 106  96 - 112 mEq/L Final  . CO2 03/23/2020 28  19 - 32 mEq/L Final  . Glucose, Bld 03/23/2020 136* 70 - 99 mg/dL Final  . BUN 03/23/2020 14  6 - 23 mg/dL Final  . Creatinine, Ser 03/23/2020 1.18  0.40 - 1.50 mg/dL Final  . Total Bilirubin 03/23/2020 0.9  0.2 - 1.2 mg/dL Final  . Alkaline Phosphatase 03/23/2020 72  39 - 117 U/L Final  . AST 03/23/2020 15  0 - 37 U/L Final  . ALT 03/23/2020 7  0 - 53 U/L Final  . Total Protein 03/23/2020 6.2  6.0 - 8.3 g/dL Final  . Albumin 03/23/2020 4.2  3.5 - 5.2 g/dL Final  . GFR 03/23/2020 59.96* >60.00 mL/min Final  . Calcium 03/23/2020 9.2  8.4 - 10.5 mg/dL Final  . WBC 03/23/2020 100.4 Repeated and verified X2.* 4.0 - 10.5 K/uL Final  . RBC 03/23/2020 2.40 Repeated and verified X2.* 4.22 - 5.81 Mil/uL Final  . Hemoglobin 03/23/2020 8.1 Repeated and verified X2.* 13.0 - 17.0 g/dL Final  . HCT 03/23/2020 25.9 Repeated and verified X2.* 39.0 - 52.0 % Final  . MCV 03/23/2020 107.6* 78.0 - 100.0 fl Final  . MCHC 03/23/2020 31.4  30.0 - 36.0 g/dL Final  . RDW 03/23/2020 13.4  11.5 - 15.5 % Final  . Platelets 03/23/2020 269.0  150.0 - 400.0 K/uL Final  . Neutrophils Relative % 03/23/2020 4.0* 43.0 - 77.0 % Final  . Lymphocytes Relative 03/23/2020 94.0 Repeated and verified X2.* 12.0 - 46.0 % Final  . Monocytes Relative 03/23/2020 1.6* 3.0 - 12.0 % Final  . Eosinophils Relative 03/23/2020 0.3  0.0 - 5.0 % Final  . Basophils Relative 03/23/2020 0.1  0.0 - 3.0 % Final  . Neutro Abs 03/23/2020 4.0  1.4 - 7.7 K/uL Final  . Lymphs Abs 03/23/2020 94.4* 0.7 - 4.0 K/uL Final  . Monocytes Absolute 03/23/2020 1.6* 0.1 - 1.0 K/uL Final  . Eosinophils  Absolute 03/23/2020 0.3  0.0 - 0.7 K/uL Final  . Basophils Absolute 03/23/2020 0.1  0.0 - 0.1 K/uL Final  Lab on  03/20/2020  Component Date Value Ref Range Status  . Cholesterol 03/20/2020 116  0 - 200 mg/dL Final   ATP III Classification       Desirable:  < 200 mg/dL               Borderline High:  200 - 239 mg/dL          High:  > = 240 mg/dL  . Triglycerides 03/20/2020 97.0  0.0 - 149.0 mg/dL Final   Normal:  <150 mg/dLBorderline High:  150 - 199 mg/dL  . HDL 03/20/2020 26.20* >39.00 mg/dL Final  . VLDL 03/20/2020 19.4  0.0 - 40.0 mg/dL Final  . LDL Cholesterol 03/20/2020 71  0 - 99 mg/dL Final  . Total CHOL/HDL Ratio 03/20/2020 4   Final                  Men          Women1/2 Average Risk     3.4          3.3Average Risk          5.0          4.42X Average Risk          9.6          7.13X Average Risk          15.0          11.0                      . NonHDL 03/20/2020 90.16   Final   NOTE:  Non-HDL goal should be 30 mg/dL higher than patient's LDL goal (i.e. LDL goal of < 70 mg/dL, would have non-HDL goal of < 100 mg/dL)  . Free T4 03/20/2020 0.92  0.60 - 1.60 ng/dL Final   Comment: Specimens from patients who are undergoing biotin therapy and /or ingesting biotin supplements may contain high levels of biotin.  The higher biotin concentration in these specimens interferes with this Free T4 assay.  Specimens that contain high levels  of biotin may cause false high results for this Free T4 assay.  Please interpret results in light of the total clinical presentation of the patient.    Marland Kitchen TSH 03/20/2020 1.14  0.35 - 4.50 uIU/mL Final  . Vitamin B-12 03/20/2020 191* 211 - 911 pg/mL Final  . Sodium 03/20/2020 142  135 - 145 mEq/L Final  . Potassium 03/20/2020 4.0  3.5 - 5.1 mEq/L Final  . Chloride 03/20/2020 107  96 - 112 mEq/L Final  . CO2 03/20/2020 28  19 - 32 mEq/L Final  . Glucose, Bld 03/20/2020 43* 70 - 99 mg/dL Final  . BUN 03/20/2020 16  6 - 23 mg/dL Final  . Creatinine, Ser  03/20/2020 1.17  0.40 - 1.50 mg/dL Final  . Total Bilirubin 03/20/2020 0.9  0.2 - 1.2 mg/dL Final  . Alkaline Phosphatase 03/20/2020 72  39 - 117 U/L Final  . AST 03/20/2020 20  0 - 37 U/L Final  . ALT 03/20/2020 9  0 - 53 U/L Final  . Total Protein 03/20/2020 6.3  6.0 - 8.3 g/dL Final  . Albumin 03/20/2020 4.2  3.5 - 5.2 g/dL Final  . GFR 03/20/2020 60.56  >60.00 mL/min Final  . Calcium 03/20/2020 9.2  8.4 - 10.5 mg/dL Final  . Hgb A1c MFr Bld 03/20/2020 4.7  4.6 - 6.5 % Final   Glycemic Control Guidelines for People with Diabetes:Non Diabetic:  <  6%Goal of Therapy: <7%Additional Action Suggested:  >8%     Allergies as of 03/23/2020      Reactions   Glycopyrrolate Rash      Medication List       Accurate as of March 23, 2020  9:23 PM. If you have any questions, ask your nurse or doctor.        amLODipine 5 MG tablet Commonly known as: NORVASC Take 1 tablet by mouth once daily   aspirin 81 MG EC tablet Take 81 mg by mouth daily. Swallow whole.   benazepril 20 MG tablet Commonly known as: LOTENSIN TAKE 1 TABLET BY MOUTH EVERY DAY   carvedilol 25 MG tablet Commonly known as: COREG TAKE ONE TABLET BY MOUTH TWICE DAILY   doxazosin 4 MG tablet Commonly known as: CARDURA Take 1 tablet (4 mg total) by mouth at bedtime.   esomeprazole 40 MG capsule Commonly known as: NEXIUM Take 40 mg by mouth daily as needed.   ferrous sulfate 325 (65 FE) MG EC tablet TAKE 1 TABLET BY MOUTH 3 TIMES A DAY WITH MEALS What changed: See the new instructions.   Florastor 250 MG capsule Generic drug: saccharomyces boulardii Take 250 mg by mouth 2 (two) times daily.   Glucagon Emergency 1 MG Kit Inject 1 mg into the skin once as needed (For severe low blood sugar).   ibuprofen 200 MG tablet Commonly known as: ADVIL Take 200 mg by mouth every 6 (six) hours as needed for headache or mild pain.   insulin degludec 100 UNIT/ML FlexTouch Pen Commonly known as: [place] FlexTouch inject 12  units daily. if fasting blood sugars are over 140 increase dosage up to 15 units daily as directed. What changed:   how much to take  additional instructions   metFORMIN 500 MG 24 hr tablet Commonly known as: GLUCOPHAGE-XR Take 1 tablet (500 mg total) by mouth daily with supper.   NovoLOG FlexPen 100 UNIT/ML FlexPen Generic drug: insulin aspart Inject 3-4 Units into the skin 3 (three) times daily with meals. Inject 3-4 units under the skin three times daily before meals.   OneTouch Verio test strip Generic drug: glucose blood USE 1 STRIP TO CHECK GLUCOSE THREE TIMES DAILY   pioglitazone 15 MG tablet Commonly known as: ACTOS TAKE 1 TABLET BY MOUTH EVERY DAY   rosuvastatin 5 MG tablet Commonly known as: CRESTOR Take 1 tablet (5 mg total) by mouth daily.   sildenafil 20 MG tablet Commonly known as: REVATIO TAKE 5 TABLETS BY MOUTH AS NEEDED once a week.       Allergies:  Allergies  Allergen Reactions  . Glycopyrrolate Rash    Past Medical History:  Diagnosis Date  . CLL (chronic lymphocytic leukemia) ([place])   . Diabetes mellitus without complication ([place])   . Diverticulosis   . External hemorrhoids   . GERD (gastroesophageal reflux disease)   . Hiatal hernia   . History of colon polyps   . Hyperlipidemia   . Hypertension   . Internal hemorrhoids   . Lymphocytosis   . Schatzki's ring   . Sleep apnea     Past Surgical History:  Procedure Laterality Date  . BLEPHAROPLASTY    . COLONOSCOPY  2012  . HEMORRHOID SURGERY     Sclerotherapy  . KNEE SURGERY Right 2002  . POLYPECTOMY    . SHOULDER SURGERY Right 2007    Family History  Problem Relation Age of Onset  . Kidney disease Mother   . CAD Maternal  Aunt   . Colon cancer Neg Hx   . Esophageal cancer Neg Hx   . Rectal cancer Neg Hx   . Stomach cancer Neg Hx     Social History:  reports that he has never smoked. He quit smokeless tobacco use about 7 years ago.  His smokeless tobacco use included chew.  He reports current alcohol use of about 6.0 standard drinks of alcohol per week. He reports that he does not use drugs.  Review of Systems:  ANEMIA: This appears to be significantly worse.  Also was told to start B12 on his last visit  Lab Results  Component Value Date   VITAMINB12 191 (L) 03/20/2020   Lab Results  Component Value Date   HGB 8.1 Repeated and verified X2. (L) 03/23/2020     HYPERLIPIDEMIA: The lipid abnormality consists of elevated LDL which is well controlled with 5 mg Crestor   Lab Results  Component Value Date   CHOL 116 03/20/2020   HDL 26.20 (L) 03/20/2020   LDLCALC 71 03/20/2020   TRIG 97.0 03/20/2020   CHOLHDL 4 03/20/2020     ROS  CLL: Followed by oncologist and no treatment recommended so far, did have abnormal biopsy of the left neck lymph node  Lab Results  Component Value Date   WBC 100.4 Repeated and verified X2. ([place]) 03/23/2020        Objective:    Physical Exam  BP 122/60 (BP Location: Left Arm, Patient Position: Sitting, Cuff Size: Normal)   Pulse 62   Ht 5' 7.5" (1.715 m)   Wt 152 lb 12.8 oz (69.3 kg)   SpO2 98%   BMI 23.58 kg/m      ASSESSMENT/ PLAN:   Diabetes type 2, insulin-dependent and nonobese  See history of present illness for detailed discussion of his current management, blood sugar patterns and problems identified  His A1c is falsely low at 4.7 because of anemia  His blood sugars are being checked mostly before dinnertime and sporadically in the mornings Since fasting reading was 56 today and he has had some low sugars before dinnertime especially with late meals he needs to reduce his basal insulin Difficult to adjust his mealtime insulin doses because he does not check readings after meals Recently having a decreased appetite  Recommended the following today  We will stop Metformin because of his decreased appetite  Reduce Tresiba to 10 units  Check readings after meals consistently to help adjust  his mealtime dose  Have balanced meals with some protein consistently   CLL: This appears to be significantly worse with new anemia and white blood cells around 100,000 now Lab results forwarded to hematologist for action  Vitamin B12 deficiency: Continue 1000 mcg daily  Hyperlipidemia: Has been controlled on 5 mg Crestor and levels are excellent  HYPERTENSION: Consistently controlled on multiple medications His blood pressure appears to be relatively lower and he has occasional feelings of dizziness He can cut down his doxazosin to half a tablet now, no previous history of prostatism     Patient Instructions  Tresiba 10 units Stop Metformin  Doxazosin 1/2 daily and if BP is below 120, stop it     Elayne Snare 03/23/2020, 9:23 PM

## 2020-03-23 NOTE — Patient Instructions (Addendum)
Tresiba 10 units Stop Metformin  Doxazosin 1/2 daily and if BP is below 120, stop it

## 2020-03-23 NOTE — Patient Instructions (Signed)
If you are age 77 or older, your body mass index should be between 23-30. Your Body mass index is 23.46 kg/m. If this is out of the aforementioned range listed, please consider follow up with your Primary Care Provider.  If you are age 3 or younger, your body mass index should be between 19-25. Your Body mass index is 23.46 kg/m. If this is out of the aformentioned range listed, please consider follow up with your Primary Care Provider.   Your provider has requested that you go to the basement level for lab work before leaving today. Press "B" on the elevator. The lab is located at the first door on the left as you exit the elevator.  Due to recent changes in healthcare laws, you may see the results of your imaging and laboratory studies on MyChart before your provider has had a chance to review them.  We understand that in some cases there may be results that are confusing or concerning to you. Not all laboratory results come back in the same time frame and the provider may be waiting for multiple results in order to interpret others.  Please give [place] 48 hours in order for your provider to thoroughly review all the results before contacting the office for clarification of your results.   You have been scheduled for a CT scan of the abdomen and pelvis at [facility] are scheduled on 03/30/20 at 8:30am You should arrive 15 minutes prior to your appointment time for registration. Please follow the written instructions below on the day of your exam:  1) Do not eat or drink anything after 4:30am(4 hours prior to your test) 2) You have been given 2 bottles of oral contrast to drink. The solution may taste better if refrigerated, but do NOT add ice or any other liquid to this solution. Shake well before drinking.    Drink 1 bottle of contrast @6 :30am(2 hours prior to your exam)  Drink 1 bottle of contrast @ 7:30am (1 hour prior to your exam)  You may take any medications as  prescribed with a small amount of water, if necessary. If you take any of the following medications: METFORMIN, GLUCOPHAGE, GLUCOVANCE, AVANDAMET, RIOMET, FORTAMET, [place] MET, JANUMET, GLUMETZA or METAGLIP, you MAY be asked to HOLD this medication 48 hours AFTER the exam.  The purpose of you drinking the oral contrast is to aid in the visualization of your intestinal tract. The contrast solution may cause some diarrhea. Depending on your individual set of symptoms, you may also receive an intravenous injection of x-ray contrast/dye.   This test typically takes 30-45 minutes to complete.  Please continue Nexium as needed  Please continue Florastor as you have been taking it.  Thank you, Dr. Zenovia Jarred

## 2020-03-24 ENCOUNTER — Other Ambulatory Visit: Payer: Self-pay | Admitting: Physician Assistant

## 2020-03-24 ENCOUNTER — Telehealth: Payer: Self-pay | Admitting: Internal Medicine

## 2020-03-24 ENCOUNTER — Other Ambulatory Visit: Payer: Self-pay | Admitting: Medical Oncology

## 2020-03-24 DIAGNOSIS — C9110 Chronic lymphocytic leukemia of B-cell type not having achieved remission: Secondary | ICD-10-CM

## 2020-03-24 NOTE — Progress Notes (Signed)
Schedule message sent for F/U with [facility].

## 2020-03-24 NOTE — Telephone Encounter (Signed)
Scheduled appt per 4/9 sch message - unable to reach pt - left message with appt date and time

## 2020-03-25 ENCOUNTER — Other Ambulatory Visit: Payer: Self-pay

## 2020-03-27 MED ORDER — AMLODIPINE BESYLATE 5 MG PO TABS: 5.0000 mg | ORAL_TABLET | Freq: Every day | ORAL | 0 refills | Status: DC

## 2020-03-28 ENCOUNTER — Other Ambulatory Visit: Payer: Self-pay | Admitting: Medical Oncology

## 2020-03-28 ENCOUNTER — Inpatient Hospital Stay: Payer: Medicare HMO

## 2020-03-28 ENCOUNTER — Other Ambulatory Visit: Payer: Self-pay

## 2020-03-28 ENCOUNTER — Inpatient Hospital Stay: Payer: Medicare HMO | Attending: Internal Medicine | Admitting: Internal Medicine

## 2020-03-28 ENCOUNTER — Encounter: Payer: Self-pay | Admitting: Internal Medicine

## 2020-03-28 VITALS — BP 138/66 | HR 62 | Temp 97.8°F | Resp 18 | Ht 67.5 in | Wt 152.4 lb

## 2020-03-28 DIAGNOSIS — Z5112 Encounter for antineoplastic immunotherapy: Secondary | ICD-10-CM | POA: Insufficient documentation

## 2020-03-28 DIAGNOSIS — D649 Anemia, unspecified: Secondary | ICD-10-CM | POA: Insufficient documentation

## 2020-03-28 DIAGNOSIS — I10 Essential (primary) hypertension: Secondary | ICD-10-CM

## 2020-03-28 DIAGNOSIS — C9110 Chronic lymphocytic leukemia of B-cell type not having achieved remission: Secondary | ICD-10-CM | POA: Insufficient documentation

## 2020-03-28 DIAGNOSIS — Z5111 Encounter for antineoplastic chemotherapy: Secondary | ICD-10-CM | POA: Diagnosis not present

## 2020-03-28 DIAGNOSIS — Z7189 Other specified counseling: Secondary | ICD-10-CM

## 2020-03-28 DIAGNOSIS — R42 Dizziness and giddiness: Secondary | ICD-10-CM | POA: Diagnosis not present

## 2020-03-28 LAB — CMP (CANCER CENTER ONLY)
ALT: 9 U/L (ref 0–44)
AST: 18 U/L (ref 15–41)
Albumin: 3.8 g/dL (ref 3.5–5.0)
Alkaline Phosphatase: 75 U/L (ref 38–126)
Anion gap: 8 (ref 5–15)
BUN: 15 mg/dL (ref 8–23)
CO2: 25 mmol/L (ref 22–32)
Calcium: 8.5 mg/dL — ABNORMAL LOW (ref 8.9–10.3)
Chloride: 105 mmol/L (ref 98–111)
Creatinine: 1.29 mg/dL — ABNORMAL HIGH (ref 0.61–1.24)
GFR, Est AFR Am: >60 mL/min (ref >60)
GFR, Estimated: 53 mL/min — ABNORMAL LOW (ref >60)
Glucose, Bld: 280 mg/dL — ABNORMAL HIGH (ref 70–99)
Potassium: 4.5 mmol/L (ref 3.5–5.1)
Sodium: 138 mmol/L (ref 135–145)
Total Bilirubin: 1.1 mg/dL (ref 0.3–1.2)
Total Protein: 6.2 g/dL — ABNORMAL LOW (ref 6.5–8.1)

## 2020-03-28 LAB — SAMPLE TO BLOOD BANK

## 2020-03-28 LAB — CBC WITH DIFFERENTIAL (CANCER CENTER ONLY)
Abs Immature Granulocytes: 0 10*3/uL (ref 0.00–0.07)
Basophils Absolute: 0 10*3/uL (ref 0.0–0.1)
Basophils Relative: 0 %
Eosinophils Absolute: 0 10*3/uL (ref 0.0–0.5)
Eosinophils Relative: 0 %
HCT: 25.1 % — ABNORMAL LOW (ref 39.0–52.0)
Hemoglobin: 7.5 g/dL — ABNORMAL LOW (ref 13.0–17.0)
Lymphocytes Relative: 91 %
Lymphs Abs: 89.3 10*3/uL — ABNORMAL HIGH (ref 0.7–4.0)
MCH: 33.5 pg (ref 26.0–34.0)
MCHC: 29.9 g/dL — ABNORMAL LOW (ref 30.0–36.0)
MCV: 112.1 fL — ABNORMAL HIGH (ref 80.0–100.0)
Monocytes Absolute: 1 10*3/uL (ref 0.1–1.0)
Monocytes Relative: 1 %
Neutro Abs: 7.8 10*3/uL — ABNORMAL HIGH (ref 1.7–7.7)
Neutrophils Relative %: 8 %
Platelet Count: 248 10*3/uL (ref 150–400)
RBC: 2.24 MIL/uL — ABNORMAL LOW (ref 4.22–5.81)
RDW: 13.8 % (ref 11.5–15.5)
WBC Count: 98.1 10*3/uL (ref 4.0–10.5)
nRBC: 0 % (ref 0.0–0.2)

## 2020-03-28 LAB — LACTATE DEHYDROGENASE: LDH: 492 U/L — ABNORMAL HIGH (ref 98–192)

## 2020-03-28 LAB — PREPARE RBC (CROSSMATCH)

## 2020-03-28 MED ORDER — DIPHENHYDRAMINE HCL 25 MG PO CAPS: 25.0000 mg | ORAL_CAPSULE | Freq: Once | ORAL | Status: DC

## 2020-03-28 MED ORDER — PROCHLORPERAZINE MALEATE 10 MG PO TABS: 10.0000 mg | ORAL_TABLET | Freq: Four times a day (QID) | ORAL | 0 refills | Status: DC | PRN

## 2020-03-28 MED ORDER — ALLOPURINOL 100 MG PO TABS: 100.0000 mg | ORAL_TABLET | Freq: Two times a day (BID) | ORAL | 2 refills | Status: AC

## 2020-03-28 MED ORDER — ACETAMINOPHEN 325 MG PO TABS: 650.0000 mg | ORAL_TABLET | Freq: Once | ORAL | Status: DC

## 2020-03-28 MED ORDER — DIPHENHYDRAMINE HCL 25 MG PO CAPS
ORAL_CAPSULE | ORAL | Status: AC
  Filled 2020-03-28: qty 1

## 2020-03-28 MED ORDER — ACETAMINOPHEN 325 MG PO TABS
ORAL_TABLET | ORAL | Status: AC
  Filled 2020-03-28: qty 2

## 2020-03-28 MED ORDER — METHYLPREDNISOLONE 4 MG PO TBPK: ORAL_TABLET | ORAL | 0 refills | Status: DC

## 2020-03-28 MED ORDER — SODIUM CHLORIDE 0.9% IV SOLUTION
250.0000 mL | Freq: Once | INTRAVENOUS | Status: AC
  Administered 2020-03-28: 250 mL via INTRAVENOUS
  Filled 2020-03-28: qty 250

## 2020-03-28 NOTE — Progress Notes (Signed)
Spoke to [place] in blood bank and blood will not be ready in time for [place] to be able to transfuse today.  Informed Amy, Agricultural consultant. Patient rescheduled to come in on 03/29/20 in AM.  Blood bank notified of change of appointment.  Patient instructed to keep blue blood bank band on for appointment tomorrow (03/29/20).

## 2020-03-28 NOTE — Progress Notes (Signed)
[place] [phone]   [phone]) [phone]  OFFICE PROGRESS NOTE  Elayne Snare, MD [address] [place] [address]  DIAGNOSIS: Chronic lymphocytic leukemia diagnosed in October 2014.  PRIOR THERAPY: None  CURRENT THERAPY: Systemic chemotherapy with bendamustine 90 mg/M2 on days 1 and 2 and Rituxan 375 mg/M2 on day 1 every 4 weeks.  First dose 04/04/2020.  INTERVAL HISTORY: Tony Marshall 79 y.o. male returns to the clinic today for follow-up visit accompanied by his wife.  The patient is complaining of increasing fatigue and weakness as well as dizzy spells.  He was seen by gastroenterology recently and had upper endoscopy that was unremarkable except for gastritis.  Is currently taking oral iron tablet 2 tablets daily.  He was noted to have increase in the total white blood count as well as drop in his hemoglobin and hematocrit in the last few weeks.  The patient was referred to me today for evaluation and recommendation regarding his condition.  MEDICAL HISTORY: Past Medical History:  Diagnosis Date  . CLL (chronic lymphocytic leukemia) ([place])   . Diabetes mellitus without complication ([place])   . Diverticulosis   . External hemorrhoids   . GERD (gastroesophageal reflux disease)   . Hiatal hernia   . History of colon polyps   . Hyperlipidemia   . Hypertension   . Internal hemorrhoids   . Lymphocytosis   . Schatzki's ring   . Sleep apnea     ALLERGIES:  is allergic to glycopyrrolate.  MEDICATIONS:  Current Outpatient Medications  Medication Sig Dispense Refill  . amLODipine (NORVASC) 5 MG tablet Take 1 tablet (5 mg total) by mouth daily. 90 tablet 0  . aspirin 81 MG EC tablet Take 81 mg by mouth daily. Swallow whole.    . benazepril (LOTENSIN) 20 MG tablet TAKE 1 TABLET BY MOUTH EVERY DAY 90 tablet 1  . carvedilol (COREG) 25 MG tablet TAKE ONE TABLET BY MOUTH TWICE DAILY  180 tablet 0  . doxazosin (CARDURA) 4 MG tablet Take  1 tablet (4 mg total) by mouth at bedtime. 90 tablet 0  . esomeprazole (NEXIUM) 40 MG capsule Take 40 mg by mouth daily as needed.     . ferrous sulfate 325 (65 FE) MG EC tablet TAKE 1 TABLET BY MOUTH 3 TIMES A DAY WITH MEALS (Patient taking differently: Take 325 mg by mouth 2 (two) times daily. ) 90 tablet 0  . glucagon (GLUCAGON EMERGENCY) 1 MG injection Inject 1 mg into the skin once as needed (For severe low blood sugar). 1 each 5  . ibuprofen (ADVIL) 200 MG tablet Take 200 mg by mouth every 6 (six) hours as needed for headache or mild pain.    Marland Kitchen insulin aspart (NOVOLOG FLEXPEN) 100 UNIT/ML FlexPen Inject 3-4 Units into the skin 3 (three) times daily with meals. Inject 3-4 units under the skin three times daily before meals.    . insulin degludec (TRESIBA FLEXTOUCH) 100 UNIT/ML SOPN FlexTouch Pen inject 12 units daily. if fasting blood sugars are over 140 increase dosage up to 15 units daily as directed. (Patient taking differently: 14 Units. inject 14 units daily. if fasting blood sugars are over 140 increase dosage up to 15 units daily as directed.) 4 pen 4  . metFORMIN (GLUCOPHAGE-XR) 500 MG 24 hr tablet Take 1 tablet (500 mg total) by mouth daily with supper. 90 tablet 3  . ONETOUCH VERIO test strip USE 1 STRIP TO  CHECK GLUCOSE THREE TIMES DAILY 100 each 5  . pioglitazone (ACTOS) 15 MG tablet TAKE 1 TABLET BY MOUTH EVERY DAY 90 tablet 1  . rosuvastatin (CRESTOR) 5 MG tablet Take 1 tablet (5 mg total) by mouth daily. 90 tablet 0  . saccharomyces boulardii (FLORASTOR) 250 MG capsule Take 250 mg by mouth 2 (two) times daily.    . sildenafil (REVATIO) 20 MG tablet TAKE 5 TABLETS BY MOUTH AS NEEDED once a week. 30 tablet 2   No current facility-administered medications for this visit.    SURGICAL HISTORY:  Past Surgical History:  Procedure Laterality Date  . BLEPHAROPLASTY    . COLONOSCOPY  2012  . HEMORRHOID SURGERY     Sclerotherapy  . KNEE SURGERY Right 2002  . POLYPECTOMY    .  SHOULDER SURGERY Right 2007    REVIEW OF SYSTEMS:  Constitutional: positive for fatigue Eyes: negative Ears, nose, mouth, throat, and face: negative Respiratory: positive for dyspnea on exertion Cardiovascular: negative Gastrointestinal: negative Genitourinary:negative Integument/breast: negative Hematologic/lymphatic: negative Musculoskeletal:negative Neurological: positive for dizziness Behavioral/Psych: negative Endocrine: negative Allergic/Immunologic: negative   PHYSICAL EXAMINATION: General appearance: alert, cooperative, fatigued and no distress Head: Normocephalic, without obvious abnormality, atraumatic Neck: no JVD, supple, symmetrical, trachea midline, thyroid not enlarged, symmetric, no tenderness/mass/nodules and Around 2 cm posterior cervical neck lump Lymph nodes: Around 2 cm posterior cervical neck lump Resp: clear to auscultation bilaterally Back: symmetric, no curvature. ROM normal. No CVA tenderness. Cardio: regular rate and rhythm, S1, S2 normal, no murmur, click, rub or gallop GI: soft, non-tender; bowel sounds normal; no masses,  no organomegaly Extremities: extremities normal, atraumatic, no cyanosis or edema Neurologic: Alert and oriented X 3, normal strength and tone. Normal symmetric reflexes. Normal coordination and gait  ECOG PERFORMANCE STATUS: 1 - Symptomatic but completely ambulatory  Blood pressure 138/66, pulse 62, temperature 97.8 F (36.6 C), resp. rate 18, height 5' 7.5" (1.715 m), weight 152 lb 6.4 oz (69.1 kg), SpO2 100 %.  LABORATORY DATA: Lab Results  Component Value Date   WBC 98.1 (HH) 03/28/2020   HGB 7.5 (L) 03/28/2020   HCT 25.1 (L) 03/28/2020   MCV 112.1 (H) 03/28/2020   PLT 248 03/28/2020      Chemistry      Component Value Date/Time   NA 141 03/23/2020 1213   NA 141 08/06/2017 1306   K 4.5 03/23/2020 1213   K 4.3 08/06/2017 1306   CL 106 03/23/2020 1213   CO2 28 03/23/2020 1213   CO2 25 08/06/2017 1306   BUN 14  03/23/2020 1213   BUN 22.9 08/06/2017 1306   CREATININE 1.18 03/23/2020 1213   CREATININE 1.24 12/29/2019 1128   CREATININE 1.3 08/06/2017 1306      Component Value Date/Time   CALCIUM 9.2 03/23/2020 1213   CALCIUM 9.3 08/06/2017 1306   ALKPHOS 72 03/23/2020 1213   ALKPHOS 47 08/06/2017 1306   AST 15 03/23/2020 1213   AST 17 12/29/2019 1128   AST 20 08/06/2017 1306   ALT 7 03/23/2020 1213   ALT 8 12/29/2019 1128   ALT 19 08/06/2017 1306   BILITOT 0.9 03/23/2020 1213   BILITOT 0.5 12/29/2019 1128   BILITOT 0.46 08/06/2017 1306       RADIOGRAPHIC STUDIES: No results found.  ASSESSMENT AND PLAN:  This is a very pleasant 79 years old white male with chronic lymphocytic leukemia currently on observation. The patient felt a mass in the left posterior neck area over the last few weeks but  started getting smaller in size. The patient had further increase in his total white blood count close to 100,000.  He also started having worsening anemia likely secondary to the underlying CLL.  His platelets count are still normal. I had a lengthy discussion with the patient and his wife today about his current condition and treatment options.  I think the patient reached the point that he would require treatment because of the complication of his CLL especially with the anemia and lymphadenopathy. I recommended for him treatment with bendamustine 90 mg/M2 on days 1 and 2 in addition to Rituxan 375 mg/M2 on day 1 every 4 weeks for 4 cycles. I discussed with the patient the adverse effect of this treatment including but not limited to alopecia, myelosuppression, nausea and vomiting, peripheral neuropathy, liver or renal dysfunction. He is expected to start the first cycle of this treatment next week. I will continue to monitor his blood work closely on weekly basis. For the tumor lysis prophylaxis, will start the patient on allopurinol 100 mg p.o. twice daily. I will also send prescription for  Compazine to his pharmacy. For the lack of appetite, I will start the patient on a Medrol Dosepak and he will monitor his blood sugar closely. For the anemia, I will arrange for the patient to receive 2 units of PRBCs transfusion this week. He will come back for follow-up visit in 2 weeks for evaluation and management of any adverse effect of his treatment. We will arrange for the patient to have a chemotherapy education class before the first dose of his treatment. He was advised to call immediately if he has any concerning symptoms in the interval. The patient voices understanding of current disease status and treatment options and is in agreement with the current care plan. All questions were answered. The patient knows to call the clinic with any problems, questions or concerns. We can certainly see the patient much sooner if necessary.  Disclaimer: This note was dictated with voice recognition software. Similar sounding words can inadvertently be transcribed and may not be corrected upon review.

## 2020-03-28 NOTE — Progress Notes (Signed)
START OFF PATHWAY REGIMEN - Lymphoma and CLL   [id number]:Bendamustine + Rituximab (90/375) q28 Days:   A cycle is every 28 days:     Bendamustine      Rituximab-xxxx   **Always confirm dose/schedule in your pharmacy ordering system**  Patient Characteristics: Chronic Lymphocytic Leukemia (CLL), First Line, Treatment Indicated, 17p del(-)/Unknown and ATM Mutation Negative/Unknown and TP53 Mutation Negative/Unknown, Age ? 72 or Frail (Any Age) Disease Type: Chronic Lymphocytic Leukemia (CLL) Disease Type: Not Applicable Disease Type: Not Applicable Line of Therapy: First Line Rai Stage: III Treatment Indicated<= Treatment Indicated ATM Mutation Status: Unknown 17p Deletion Status: Unknown TP53 Mutation Status: Unknown Patient Age: ? 5 Patient Condition: Frail Patient Intent of Therapy: Non-Curative / Palliative Intent, Discussed with Patient

## 2020-03-29 ENCOUNTER — Other Ambulatory Visit: Payer: Self-pay | Admitting: Emergency Medicine

## 2020-03-29 ENCOUNTER — Other Ambulatory Visit: Payer: Self-pay

## 2020-03-29 ENCOUNTER — Inpatient Hospital Stay (HOSPITAL_BASED_OUTPATIENT_CLINIC_OR_DEPARTMENT_OTHER): Payer: Medicare HMO

## 2020-03-29 DIAGNOSIS — D649 Anemia, unspecified: Secondary | ICD-10-CM

## 2020-03-29 DIAGNOSIS — Z5112 Encounter for antineoplastic immunotherapy: Secondary | ICD-10-CM | POA: Diagnosis not present

## 2020-03-29 DIAGNOSIS — C9110 Chronic lymphocytic leukemia of B-cell type not having achieved remission: Secondary | ICD-10-CM

## 2020-03-29 MED ORDER — ACETAMINOPHEN 325 MG PO TABS
ORAL_TABLET | ORAL | Status: AC
  Filled 2020-03-29: qty 2

## 2020-03-29 MED ORDER — DIPHENHYDRAMINE HCL 25 MG PO CAPS
ORAL_CAPSULE | ORAL | Status: AC
  Filled 2020-03-29: qty 1

## 2020-03-29 MED ORDER — SODIUM CHLORIDE 0.9% IV SOLUTION
250.0000 mL | Freq: Once | INTRAVENOUS | Status: AC
  Administered 2020-03-29: 09:00:00 250 mL via INTRAVENOUS
  Filled 2020-03-29: qty 250

## 2020-03-29 MED ORDER — ACETAMINOPHEN 325 MG PO TABS
650.0000 mg | ORAL_TABLET | Freq: Once | ORAL | Status: AC
  Administered 2020-03-29: 09:00:00 650 mg via ORAL

## 2020-03-29 MED ORDER — DIPHENHYDRAMINE HCL 25 MG PO CAPS
25.0000 mg | ORAL_CAPSULE | Freq: Once | ORAL | Status: AC
  Administered 2020-03-29: 09:00:00 25 mg via ORAL

## 2020-03-29 NOTE — Patient Instructions (Signed)
Blood Transfusion, Adult, Care After This sheet gives you information about how to care for yourself after your procedure. Your doctor may also give you more specific instructions. If you have problems or questions, contact your doctor. What can I expect after the procedure? After the procedure, it is common to have:  Bruising and soreness at the IV site.  A fever or chills on the day of the procedure. This may be your body's response to the new blood cells received.  A headache. Follow these instructions at home: Insertion site care      Follow instructions from your doctor about how to take care of your insertion site. This is where an IV tube was put into your vein. Make sure you: ? Wash your hands with soap and water before and after you change your bandage (dressing). If you cannot use soap and water, use hand sanitizer. ? Change your bandage as told by your doctor.  Check your insertion site every day for signs of infection. Check for: ? Redness, swelling, or pain. ? Bleeding from the site. ? Warmth. ? Pus or a bad smell. General instructions  Take over-the-counter and prescription medicines only as told by your doctor.  Rest as told by your doctor.  Go back to your normal activities as told by your doctor.  Keep all follow-up visits as told by your doctor. This is important. Contact a doctor if:  You have itching or red, swollen areas of skin (hives).  You feel worried or nervous (anxious).  You feel weak after doing your normal activities.  You have redness, swelling, warmth, or pain around the insertion site.  You have blood coming from the insertion site, and the blood does not stop with pressure.  You have pus or a bad smell coming from the insertion site. Get help right away if:  You have signs of a serious reaction. This may be coming from an allergy or the body's defense system (immune system). Signs include: ? Trouble breathing or shortness of  breath. ? Swelling of the face or feeling warm (flushed). ? Fever or chills. ? Head, chest, or back pain. ? Dark pee (urine) or blood in the pee. ? Widespread rash. ? Fast heartbeat. ? Feeling dizzy or light-headed. You may receive your blood transfusion in an outpatient setting. If so, you will be told whom to contact to report any reactions. These symptoms may be an emergency. Do not wait to see if the symptoms will go away. Get medical help right away. Call your local emergency services (911 in the [place] Do not drive yourself to the hospital. Summary  Bruising and soreness at the IV site are common.  Check your insertion site every day for signs of infection.  Rest as told by your doctor. Go back to your normal activities as told by your doctor.  Get help right away if you have signs of a serious reaction. This information is not intended to replace advice given to you by your health care provider. Make sure you discuss any questions you have with your health care provider. Document Revised: 05/27/2019 Document Reviewed: 05/27/2019 Elsevier Patient Education  [place].   Blood Transfusion, Adult A blood transfusion is a procedure in which you receive blood through an IV tube. You may need this procedure because of:  A bleeding disorder.  An illness.  An injury.  A surgery. The blood may come from someone else (a donor). You may also be able to donate  blood for yourself. The blood given in a transfusion is made up of different types of cells. You may get:  Red blood cells. These carry oxygen to the cells in the body.  White blood cells. These help you fight infections.  Platelets. These help your blood to clot.  Plasma. This is the liquid part of your blood. It carries proteins and other substances through the body. If you have a clotting disorder, you may also get other types of blood products. Tell your doctor about:  Any blood disorders you have.  Any  reactions you have had during a blood transfusion in the past.  Any allergies you have.  All medicines you are taking, including vitamins, herbs, eye drops, creams, and over-the-counter medicines.  Any surgeries you have had.  Any medical conditions you have. This includes any recent fever or cold symptoms.  Whether you are pregnant or may be pregnant. What are the risks? Generally, this is a safe procedure. However, problems may occur.  The most common problems include: ? A mild allergic reaction. This includes red, swollen areas of skin (hives) and itching. ? Fever or chills. This may be the body's response to new blood cells received. This may happen during or up to 4 hours after the transfusion.  More serious problems may include: ? Too much fluid in the lungs. This may cause breathing problems. ? A serious allergic reaction. This includes breathing trouble or swelling around the face and lips. ? Lung injury. This causes breathing trouble and low oxygen in the blood. This can happen within hours of the transfusion or days later. ? Too much iron. This can happen after getting many blood transfusions over a period of time. ? An infection or virus passed through the blood. This is rare. Donated blood is carefully tested before it is given. ? Your body's defense system (immune system) trying to attack the new blood cells. This is rare. Symptoms may include fever, chills, nausea, low blood pressure, and low back or chest pain. ? Donated cells attacking healthy tissues. This is rare. What happens before the procedure? Medicines Ask your doctor about:  Changing or stopping your normal medicines. This is important.  Taking aspirin and ibuprofen. Do not take these medicines unless your doctor tells you to take them.  Taking over-the-counter medicines, vitamins, herbs, and supplements. General instructions  Follow instructions from your doctor about what you cannot eat or drink.  You  will have a blood test to find out your blood type. The test also finds out what type of blood your body will accept and matches it to the donor type.  If you are going to have a planned surgery, you may be able to donate your own blood. This may be done in case you need a transfusion.  You will have your temperature, blood pressure, and pulse checked.  You may receive medicine to help prevent an allergic reaction. This may be done if you have had a reaction to a transfusion before. This medicine may be given to you by mouth or through an IV tube.  This procedure lasts about 1-4 hours. Plan for the time you need. What happens during the procedure?   An IV tube will be put into one of your veins.  The bag of donated blood will be attached to your IV tube. Then, the blood will enter through your vein.  Your temperature, blood pressure, and pulse will be checked often. This is done to find early signs  of a transfusion reaction.  Tell your nurse right away if you have any of these symptoms: ? Shortness of breath or trouble breathing. ? Chest or back pain. ? Fever or chills. ? Red, swollen areas of skin or itching.  If you have any signs or symptoms of a reaction, your transfusion will be stopped. You may also be given medicine.  When the transfusion is finished, your IV tube will be taken out.  Pressure may be put on the IV site for a few minutes.  A bandage (dressing) will be put on the IV site. The procedure may vary among doctors and hospitals. What happens after the procedure?  You will be monitored until you leave the hospital or clinic. This includes checking your temperature, blood pressure, pulse, breathing rate, and blood oxygen level.  Your blood may be tested to see how you are responding to the transfusion.  You may be warmed with fluids or blankets. This is done to keep the temperature of your body normal.  If you have your procedure in an outpatient setting, you will  be told whom to contact to report any reactions. Where to find more information To learn more, visit the American Red [employer]: redcross.org Summary  A blood transfusion is a procedure in which you are given blood through an IV tube.  The blood may come from someone else (a donor). You may also be able to donate blood for yourself.  The blood you are given is made up of different blood cells. You may receive red blood cells, platelets, plasma, or white blood cells.  Your temperature, blood pressure, and pulse will be checked often.  After the procedure, your blood may be tested to see how you are responding. This information is not intended to replace advice given to you by your health care provider. Make sure you discuss any questions you have with your health care provider. Document Revised: 05/27/2019 Document Reviewed: 05/27/2019 Elsevier Patient Education  [place].

## 2020-03-29 NOTE — Progress Notes (Signed)
Per [place] in blood bank pt receiving least incompatible blood type for 2 units today.  Will have MD Mohamed sign transfusion green sheet.  Pt received 2 units PRBCs today, tolerated well.  Able to drink fluids & ambulate to restroom during tx w/out any issues, declined anything to eat.  VSS.

## 2020-03-30 ENCOUNTER — Ambulatory Visit (HOSPITAL_COMMUNITY): Payer: Medicare HMO

## 2020-03-30 DIAGNOSIS — Z5112 Encounter for antineoplastic immunotherapy: Secondary | ICD-10-CM | POA: Diagnosis not present

## 2020-03-30 LAB — TYPE AND SCREEN
ABO/RH(D): A POS
Antibody Screen: POSITIVE
DAT, IgG: POSITIVE
Unit division: 0
Unit division: 0

## 2020-03-30 LAB — BPAM RBC
Blood Product Expiration Date: 202104232359
Blood Product Expiration Date: 202104232359
ISSUE DATE / TIME: 202104140937
ISSUE DATE / TIME: 202104140937
Unit Type and Rh: 600
Unit Type and Rh: 600

## 2020-04-03 ENCOUNTER — Other Ambulatory Visit: Payer: Self-pay

## 2020-04-03 ENCOUNTER — Inpatient Hospital Stay: Payer: Medicare HMO

## 2020-04-04 ENCOUNTER — Other Ambulatory Visit: Payer: Self-pay

## 2020-04-04 ENCOUNTER — Inpatient Hospital Stay: Payer: Medicare HMO

## 2020-04-04 VITALS — BP 128/51 | HR 73 | Temp 98.7°F | Resp 16 | Wt 153.0 lb

## 2020-04-04 DIAGNOSIS — C9110 Chronic lymphocytic leukemia of B-cell type not having achieved remission: Secondary | ICD-10-CM

## 2020-04-04 DIAGNOSIS — Z5112 Encounter for antineoplastic immunotherapy: Secondary | ICD-10-CM | POA: Diagnosis not present

## 2020-04-04 LAB — CMP (CANCER CENTER ONLY)
ALT: 10 U/L (ref 0–44)
AST: 16 U/L (ref 15–41)
Albumin: 3.7 g/dL (ref 3.5–5.0)
Alkaline Phosphatase: 73 U/L (ref 38–126)
Anion gap: 12 (ref 5–15)
BUN: 19 mg/dL (ref 8–23)
CO2: 24 mmol/L (ref 22–32)
Calcium: 8.8 mg/dL — ABNORMAL LOW (ref 8.9–10.3)
Chloride: 105 mmol/L (ref 98–111)
Creatinine: 1.37 mg/dL — ABNORMAL HIGH (ref 0.61–1.24)
GFR, Est AFR Am: 58 mL/min — ABNORMAL LOW (ref >60)
GFR, Estimated: 50 mL/min — ABNORMAL LOW (ref >60)
Glucose, Bld: 291 mg/dL — ABNORMAL HIGH (ref 70–99)
Potassium: 4.6 mmol/L (ref 3.5–5.1)
Sodium: 141 mmol/L (ref 135–145)
Total Bilirubin: 1 mg/dL (ref 0.3–1.2)
Total Protein: 6.2 g/dL — ABNORMAL LOW (ref 6.5–8.1)

## 2020-04-04 LAB — CBC WITH DIFFERENTIAL (CANCER CENTER ONLY)
Abs Immature Granulocytes: 0 10*3/uL (ref 0.00–0.07)
Basophils Absolute: 0 10*3/uL (ref 0.0–0.1)
Basophils Relative: 0 %
Eosinophils Absolute: 1 10*3/uL — ABNORMAL HIGH (ref 0.0–0.5)
Eosinophils Relative: 1 %
HCT: 33.5 % — ABNORMAL LOW (ref 39.0–52.0)
Hemoglobin: 10.6 g/dL — ABNORMAL LOW (ref 13.0–17.0)
Lymphocytes Relative: 94 %
Lymphs Abs: 91.9 10*3/uL — ABNORMAL HIGH (ref 0.7–4.0)
MCH: 33.3 pg (ref 26.0–34.0)
MCHC: 31.6 g/dL (ref 30.0–36.0)
MCV: 105.3 fL — ABNORMAL HIGH (ref 80.0–100.0)
Monocytes Absolute: 0 10*3/uL — ABNORMAL LOW (ref 0.1–1.0)
Monocytes Relative: 0 %
Neutro Abs: 4.9 10*3/uL (ref 1.7–7.7)
Neutrophils Relative %: 5 %
Platelet Count: 220 10*3/uL (ref 150–400)
RBC: 3.18 MIL/uL — ABNORMAL LOW (ref 4.22–5.81)
RDW: 14.5 % (ref 11.5–15.5)
WBC Count: 97.8 10*3/uL (ref 4.0–10.5)
nRBC: 0 % (ref 0.0–0.2)

## 2020-04-04 LAB — LACTATE DEHYDROGENASE: LDH: 459 U/L — ABNORMAL HIGH (ref 98–192)

## 2020-04-04 LAB — HEPATITIS B CORE ANTIBODY, TOTAL: Hep B Core Total Ab: NONREACTIVE

## 2020-04-04 LAB — URIC ACID: Uric Acid, Serum: 5.1 mg/dL (ref 3.7–8.6)

## 2020-04-04 MED ORDER — SODIUM CHLORIDE 0.9 % IV SOLN
Freq: Once | INTRAVENOUS | Status: AC
  Filled 2020-04-04: qty 250

## 2020-04-04 MED ORDER — PALONOSETRON HCL INJECTION 0.25 MG/5ML
INTRAVENOUS | Status: AC
  Filled 2020-04-04: qty 5

## 2020-04-04 MED ORDER — SODIUM CHLORIDE 0.9 % IV SOLN
375.0000 mg/m2 | Freq: Once | INTRAVENOUS | Status: AC
  Administered 2020-04-04: 700 mg via INTRAVENOUS
  Filled 2020-04-04: qty 20

## 2020-04-04 MED ORDER — ACETAMINOPHEN 325 MG PO TABS
ORAL_TABLET | ORAL | Status: AC
  Filled 2020-04-04: qty 2

## 2020-04-04 MED ORDER — DIPHENHYDRAMINE HCL 25 MG PO CAPS
50.0000 mg | ORAL_CAPSULE | Freq: Once | ORAL | Status: AC
  Administered 2020-04-04: 50 mg via ORAL

## 2020-04-04 MED ORDER — PALONOSETRON HCL INJECTION 0.25 MG/5ML
0.2500 mg | Freq: Once | INTRAVENOUS | Status: AC
  Administered 2020-04-04: 0.25 mg via INTRAVENOUS

## 2020-04-04 MED ORDER — ACETAMINOPHEN 325 MG PO TABS
650.0000 mg | ORAL_TABLET | Freq: Once | ORAL | Status: AC
  Administered 2020-04-04: 650 mg via ORAL

## 2020-04-04 MED ORDER — SODIUM CHLORIDE 0.9 % IV SOLN
90.0000 mg/m2 | Freq: Once | INTRAVENOUS | Status: AC
  Administered 2020-04-04: 175 mg via INTRAVENOUS
  Filled 2020-04-04: qty 7

## 2020-04-04 MED ORDER — SODIUM CHLORIDE 0.9 % IV SOLN
10.0000 mg | Freq: Once | INTRAVENOUS | Status: AC
  Administered 2020-04-04: 10 mg via INTRAVENOUS
  Filled 2020-04-04: qty 10

## 2020-04-04 MED ORDER — DIPHENHYDRAMINE HCL 25 MG PO CAPS
ORAL_CAPSULE | ORAL | Status: AC
  Filled 2020-04-04: qty 2

## 2020-04-04 NOTE — Patient Instructions (Addendum)
[facility] Discharge Instructions for Patients Receiving Chemotherapy  Today you received the following chemotherapy agents: Rituxumab, and Bendamustine (Bendeka).  To help prevent nausea and vomiting after your treatment, we encourage you to take your nausea medication as directed by your MD.   If you develop nausea and vomiting that is not controlled by your nausea medication, call the clinic.   BELOW ARE SYMPTOMS THAT SHOULD BE REPORTED IMMEDIATELY:  *FEVER GREATER THAN 100.5 F  *CHILLS WITH OR WITHOUT FEVER  NAUSEA AND VOMITING THAT IS NOT CONTROLLED WITH YOUR NAUSEA MEDICATION  *UNUSUAL SHORTNESS OF BREATH  *UNUSUAL BRUISING OR BLEEDING  TENDERNESS IN MOUTH AND THROAT WITH OR WITHOUT PRESENCE OF ULCERS  *URINARY PROBLEMS  *BOWEL PROBLEMS  UNUSUAL RASH Items with * indicate a potential emergency and should be followed up as soon as possible.  Feel free to call the clinic should you have any questions or concerns. The clinic phone number is [phone].  Please show the CHEMO ALERT CARD at check-in to the Emergency Department and triage nurse.  Coronavirus (COVID-19) Are you at risk?  Are you at risk for the Coronavirus (COVID-19)?  To be considered HIGH RISK for Coronavirus (COVID-19), you have to meet the following criteria:  . Traveled to [place], [place], [place], [place] or [place]; or in the [place] to [place], [place], [place], or [place]; and have fever, cough, and shortness of breath within the last 2 weeks of travel OR . Been in close contact with a person diagnosed with COVID-19 within the last 2 weeks and have fever, cough, and shortness of breath . IF YOU DO NOT MEET THESE CRITERIA, YOU ARE CONSIDERED LOW RISK FOR COVID-19.  What to do if you are HIGH RISK for COVID-19?  . If you are having a medical emergency, call 911. . Seek medical care right away. Before you go to a doctor's office, urgent care or emergency department,  call ahead and tell them about your recent travel, contact with someone diagnosed with COVID-19, and your symptoms. You should receive instructions from your physician's office regarding next steps of care.  . When you arrive at healthcare provider, tell the healthcare staff immediately you have returned from visiting [place], [place], [place], [place] or [place]; or traveled in the [place] to [place], [place], [place], or [place]; in the last two weeks or you have been in close contact with a person diagnosed with COVID-19 in the last 2 weeks.   . Tell the health care staff about your symptoms: fever, cough and shortness of breath. . After you have been seen by a medical provider, you will be either: o Tested for (COVID-19) and discharged home on quarantine except to seek medical care if symptoms worsen, and asked to  - Stay home and avoid contact with others until you get your results (4-5 days)  - Avoid travel on public transportation if possible (such as bus, train, or airplane) or o Sent to the Emergency Department by EMS for evaluation, COVID-19 testing, and possible admission depending on your condition and test results.  What to do if you are LOW RISK for COVID-19?  Reduce your risk of any infection by using the same precautions used for avoiding the common cold or flu:  . Wash your hands often with soap and warm water for at least 20 seconds.  If soap and water are not readily available, use an alcohol-based hand sanitizer with at least 60% alcohol.  .   If coughing or sneezing, cover your mouth and nose by coughing or sneezing into the elbow areas of your shirt or coat, into a tissue or into your sleeve (not your hands). . Avoid shaking hands with others and consider head nods or verbal greetings only. . Avoid touching your eyes, nose, or mouth with unwashed hands.  . Avoid close contact with people who are sick. . Avoid places or events with large numbers of people in one  location, like concerts or sporting events. . Carefully consider travel plans you have or are making. . If you are planning any travel outside or inside the [place], visit the CDC's Travelers' Health webpage for the latest health notices. . If you have some symptoms but not all symptoms, continue to monitor at home and seek medical attention if your symptoms worsen. . If you are having a medical emergency, call 911.   [place] / e-Visit: [email or web address]         [facility]: [facility]: [phone]                   [facility]: [phone]

## 2020-04-05 ENCOUNTER — Encounter: Payer: Self-pay | Admitting: Internal Medicine

## 2020-04-05 ENCOUNTER — Inpatient Hospital Stay: Payer: Medicare HMO

## 2020-04-05 ENCOUNTER — Telehealth: Payer: Self-pay | Admitting: Medical Oncology

## 2020-04-05 ENCOUNTER — Ambulatory Visit (HOSPITAL_COMMUNITY)
Admission: RE | Admit: 2020-04-05 | Discharge: 2020-04-05 | Disposition: A | Discharge status: Home / Self Care | Payer: Medicare HMO | Source: Ambulatory Visit | Attending: Internal Medicine | Admitting: Internal Medicine

## 2020-04-05 ENCOUNTER — Encounter (HOSPITAL_COMMUNITY): Payer: Self-pay

## 2020-04-05 VITALS — BP 132/64 | HR 53 | Temp 98.2°F | Resp 18

## 2020-04-05 DIAGNOSIS — C9110 Chronic lymphocytic leukemia of B-cell type not having achieved remission: Secondary | ICD-10-CM | POA: Diagnosis not present

## 2020-04-05 DIAGNOSIS — R634 Abnormal weight loss: Secondary | ICD-10-CM | POA: Insufficient documentation

## 2020-04-05 DIAGNOSIS — R161 Splenomegaly, not elsewhere classified: Secondary | ICD-10-CM | POA: Diagnosis not present

## 2020-04-05 DIAGNOSIS — R630 Anorexia: Secondary | ICD-10-CM | POA: Insufficient documentation

## 2020-04-05 DIAGNOSIS — Z5112 Encounter for antineoplastic immunotherapy: Secondary | ICD-10-CM | POA: Diagnosis not present

## 2020-04-05 LAB — HEPATITIS B E ANTIGEN: Hep B E Ag: NEGATIVE

## 2020-04-05 MED ORDER — SODIUM CHLORIDE 0.9 % IV SOLN
10.0000 mg | Freq: Once | INTRAVENOUS | Status: AC
  Administered 2020-04-05: 10 mg via INTRAVENOUS
  Filled 2020-04-05: qty 10

## 2020-04-05 MED ORDER — SODIUM CHLORIDE 0.9 % IV SOLN
90.0000 mg/m2 | Freq: Once | INTRAVENOUS | Status: AC
  Administered 2020-04-05: 175 mg via INTRAVENOUS
  Filled 2020-04-05: qty 7

## 2020-04-05 MED ORDER — SODIUM CHLORIDE 0.9 % IV SOLN
Freq: Once | INTRAVENOUS | Status: AC
  Filled 2020-04-05: qty 250

## 2020-04-05 MED ORDER — SODIUM CHLORIDE (PF) 0.9 % IJ SOLN
INTRAMUSCULAR | Status: AC
  Filled 2020-04-05: qty 50

## 2020-04-05 MED ORDER — IOHEXOL 300 MG/ML  SOLN
100.0000 mL | Freq: Once | INTRAMUSCULAR | Status: AC | PRN
  Administered 2020-04-05: 100 mL via INTRAVENOUS

## 2020-04-05 NOTE — Telephone Encounter (Signed)
Doing well except for elevated blood glucose. I instructed tp to call Dr Dwyane Dee.

## 2020-04-05 NOTE — Telephone Encounter (Signed)
Patient sent my chart message on instructions

## 2020-04-05 NOTE — Progress Notes (Signed)
Called pt to introduce myself as his Financial Resource Specialist, discuss copay assistance and the [employer] grant.  I left a msg requesting he return my call if he's interested in applying for the grants. 

## 2020-04-05 NOTE — Telephone Encounter (Signed)
Hyperglycemia-Pt concerned that since starting medrol dose Pak yesterday  ( for appetite) his FS blood glucose is running in the 300's. I instructed pt to contact Dr Dwyane Dee.

## 2020-04-07 DIAGNOSIS — R69 Illness, unspecified: Secondary | ICD-10-CM | POA: Diagnosis not present

## 2020-04-11 ENCOUNTER — Inpatient Hospital Stay (HOSPITAL_BASED_OUTPATIENT_CLINIC_OR_DEPARTMENT_OTHER): Payer: Medicare HMO | Admitting: Physician Assistant

## 2020-04-11 ENCOUNTER — Other Ambulatory Visit: Payer: Self-pay

## 2020-04-11 ENCOUNTER — Encounter: Payer: Self-pay | Admitting: Physician Assistant

## 2020-04-11 ENCOUNTER — Inpatient Hospital Stay: Payer: Medicare HMO

## 2020-04-11 VITALS — BP 121/63 | HR 64 | Temp 98.2°F | Resp 17 | Ht 67.5 in | Wt 147.2 lb

## 2020-04-11 DIAGNOSIS — Z5112 Encounter for antineoplastic immunotherapy: Secondary | ICD-10-CM | POA: Diagnosis not present

## 2020-04-11 DIAGNOSIS — C9110 Chronic lymphocytic leukemia of B-cell type not having achieved remission: Secondary | ICD-10-CM

## 2020-04-11 LAB — CBC WITH DIFFERENTIAL (CANCER CENTER ONLY)
Abs Immature Granulocytes: 0.03 10*3/uL (ref 0.00–0.07)
Basophils Absolute: 0 10*3/uL (ref 0.0–0.1)
Basophils Relative: 0 %
Eosinophils Absolute: 0.1 10*3/uL (ref 0.0–0.5)
Eosinophils Relative: 1 %
HCT: 31.3 % — ABNORMAL LOW (ref 39.0–52.0)
Hemoglobin: 10.1 g/dL — ABNORMAL LOW (ref 13.0–17.0)
Immature Granulocytes: 1 %
Lymphocytes Relative: 66 %
Lymphs Abs: 3.9 10*3/uL (ref 0.7–4.0)
MCH: 32.9 pg (ref 26.0–34.0)
MCHC: 32.3 g/dL (ref 30.0–36.0)
MCV: 102 fL — ABNORMAL HIGH (ref 80.0–100.0)
Monocytes Absolute: 0.4 10*3/uL (ref 0.1–1.0)
Monocytes Relative: 7 %
Neutro Abs: 1.4 10*3/uL — ABNORMAL LOW (ref 1.7–7.7)
Neutrophils Relative %: 25 %
Platelet Count: 163 10*3/uL (ref 150–400)
RBC: 3.07 MIL/uL — ABNORMAL LOW (ref 4.22–5.81)
RDW: 14.3 % (ref 11.5–15.5)
WBC Count: 5.8 10*3/uL (ref 4.0–10.5)
nRBC: 0 % (ref 0.0–0.2)

## 2020-04-11 LAB — CMP (CANCER CENTER ONLY)
ALT: 9 U/L (ref 0–44)
AST: 10 U/L — ABNORMAL LOW (ref 15–41)
Albumin: 3.4 g/dL — ABNORMAL LOW (ref 3.5–5.0)
Alkaline Phosphatase: 51 U/L (ref 38–126)
Anion gap: 6 (ref 5–15)
BUN: 14 mg/dL (ref 8–23)
CO2: 27 mmol/L (ref 22–32)
Calcium: 8.5 mg/dL — ABNORMAL LOW (ref 8.9–10.3)
Chloride: 103 mmol/L (ref 98–111)
Creatinine: 1.2 mg/dL (ref 0.61–1.24)
GFR, Est AFR Am: >60 mL/min (ref >60)
GFR, Estimated: 58 mL/min — ABNORMAL LOW (ref >60)
Glucose, Bld: 218 mg/dL — ABNORMAL HIGH (ref 70–99)
Potassium: 4.6 mmol/L (ref 3.5–5.1)
Sodium: 136 mmol/L (ref 135–145)
Total Bilirubin: 0.8 mg/dL (ref 0.3–1.2)
Total Protein: 5.5 g/dL — ABNORMAL LOW (ref 6.5–8.1)

## 2020-04-11 NOTE — Progress Notes (Signed)
[facility] OFFICE PROGRESS NOTE  Elayne Snare, MD [address] [place] [address]  DIAGNOSIS: Chronic lymphocytic leukemia diagnosed in October 2014.  PRIOR THERAPY: None  CURRENT THERAPY: Systemic chemotherapy with bendamustine 90 mg/M2 on days 1 and 2 and Rituxan 375 mg/M2 on day 1 every 4 weeks.  First dose 04/04/2020.  INTERVAL HISTORY: Tony Marshall 79 y.o. male returns to the clinic today for a follow-up visit accompanied by his wife.  The patient is feeling well today without any concerning complaints.  The patient completed his first treatment with bendamustine and Rituxan last week and he tolerated well without any adverse side effects.  He denies any fever or chills.  He reports his baseline night sweats which has been going on for several years.  He continues to lose weight secondary to decreased appetite.  He was given a prescription for Medrol Dosepak in the past for his appetite but that caused hyperglycemia and he has stopped taking that at this time.  He denies any signs and symptoms of infection including sore throat, cough, skin infections, diarrhea, or dysuria.  He denies any peripheral neuropathy.  He denies any abnormal bleeding or bruising including epistaxis, gingival bleeding, hemoptysis, hematuria, melena, or hematochezia.  He denies any nausea, vomiting, abdominal pain, or early satiety.  The patient is here today for evaluation, repeat blood work, and a 1 week follow-up visit after completing his first cycle of treatment.  MEDICAL HISTORY: Past Medical History:  Diagnosis Date  . CLL (chronic lymphocytic leukemia) ([facility]) dx'd 2014   progression 12/2019  . Diabetes mellitus without complication ([facility])   . Diverticulosis   . External hemorrhoids   . GERD (gastroesophageal reflux disease)   . Hiatal hernia   . History of colon polyps   . Hyperlipidemia   . Hypertension   . Internal hemorrhoids   . Lymphocytosis   . Schatzki's ring    . Sleep apnea     ALLERGIES:  is allergic to glycopyrrolate.  MEDICATIONS:  Current Outpatient Medications  Medication Sig Dispense Refill  . allopurinol (ZYLOPRIM) 100 MG tablet Take 1 tablet (100 mg total) by mouth 2 (two) times daily. 60 tablet 2  . amLODipine (NORVASC) 5 MG tablet Take 1 tablet (5 mg total) by mouth daily. 90 tablet 0  . aspirin 81 MG EC tablet Take 81 mg by mouth daily. Swallow whole.    . benazepril (LOTENSIN) 20 MG tablet TAKE 1 TABLET BY MOUTH EVERY DAY 90 tablet 1  . carvedilol (COREG) 25 MG tablet TAKE ONE TABLET BY MOUTH TWICE DAILY  180 tablet 0  . doxazosin (CARDURA) 4 MG tablet Take 1 tablet (4 mg total) by mouth at bedtime. 90 tablet 0  . esomeprazole (NEXIUM) 40 MG capsule Take 40 mg by mouth daily as needed.     . ferrous sulfate 325 (65 FE) MG EC tablet TAKE 1 TABLET BY MOUTH 3 TIMES A DAY WITH MEALS (Patient taking differently: Take 325 mg by mouth 2 (two) times daily. ) 90 tablet 0  . glucagon (GLUCAGON EMERGENCY) 1 MG injection Inject 1 mg into the skin once as needed (For severe low blood sugar). 1 each 5  . ibuprofen (ADVIL) 200 MG tablet Take 200 mg by mouth every 6 (six) hours as needed for headache or mild pain.    Marland Kitchen insulin aspart (NOVOLOG FLEXPEN) 100 UNIT/ML FlexPen Inject 3-4 Units into the skin 3 (three) times daily with meals. Inject 3-4 units  under the skin three times daily before meals.    . insulin degludec (TRESIBA FLEXTOUCH) 100 UNIT/ML SOPN FlexTouch Pen inject 12 units daily. if fasting blood sugars are over 140 increase dosage up to 15 units daily as directed. (Patient taking differently: 14 Units. inject 14 units daily. if fasting blood sugars are over 140 increase dosage up to 15 units daily as directed.) 4 pen 4  . metFORMIN (GLUCOPHAGE-XR) 500 MG 24 hr tablet Take 1 tablet (500 mg total) by mouth daily with supper. 90 tablet 3  . methylPREDNISolone (MEDROL DOSEPAK) 4 MG TBPK tablet Use as instructed. 21 tablet 0  . ONETOUCH VERIO  test strip USE 1 STRIP TO CHECK GLUCOSE THREE TIMES DAILY 100 each 5  . pioglitazone (ACTOS) 15 MG tablet TAKE 1 TABLET BY MOUTH EVERY DAY 90 tablet 1  . prochlorperazine (COMPAZINE) 10 MG tablet Take 1 tablet (10 mg total) by mouth every 6 (six) hours as needed for nausea or vomiting. 30 tablet 0  . rosuvastatin (CRESTOR) 5 MG tablet Take 1 tablet (5 mg total) by mouth daily. 90 tablet 0  . saccharomyces boulardii (FLORASTOR) 250 MG capsule Take 250 mg by mouth 2 (two) times daily.    . sildenafil (REVATIO) 20 MG tablet TAKE 5 TABLETS BY MOUTH AS NEEDED once a week. 30 tablet 2   No current facility-administered medications for this visit.    SURGICAL HISTORY:  Past Surgical History:  Procedure Laterality Date  . BLEPHAROPLASTY    . COLONOSCOPY  2012  . HEMORRHOID SURGERY     Sclerotherapy  . KNEE SURGERY Right 2002  . POLYPECTOMY    . SHOULDER SURGERY Right 2007    REVIEW OF SYSTEMS:   Review of Systems  Constitutional: Positive for baseline night sweats, decreased appetite, and weight loss.  Negative for chills and fever.   HENT: Negative for mouth sores, nosebleeds, sore throat and trouble swallowing.   Eyes: Negative for eye problems and icterus.  Respiratory: Negative for cough, hemoptysis, shortness of breath and wheezing.  Cardiovascular: Negative for chest pain and leg swelling.  Gastrointestinal: Negative for abdominal pain, constipation, diarrhea, nausea and vomiting.  Genitourinary: Negative for bladder incontinence, difficulty urinating, dysuria, frequency and hematuria.   Musculoskeletal: Negative for back pain, gait problem, neck pain and neck stiffness.  Skin: Negative for itching and rash.  Neurological: Negative for dizziness, extremity weakness, gait problem, headaches, light-headedness and seizures.  Hematological: Negative for adenopathy. Does not bruise/bleed easily.  Psychiatric/Behavioral: Negative for confusion, depression and sleep disturbance. The patient  is not nervous/anxious.     PHYSICAL EXAMINATION:  Blood pressure 121/63, pulse 64, temperature 98.2 F (36.8 C), temperature source Temporal, resp. rate 17, height 5' 7.5" (1.715 m), weight 147 lb 3.2 oz (66.8 kg), SpO2 100 %.  ECOG PERFORMANCE STATUS: 1 - Symptomatic but completely ambulatory  Physical Exam  Constitutional: Oriented to person, place, and time and well-developed, well-nourished, and in no distress.  HENT:  Head: Normocephalic and atraumatic.  Mouth/Throat: Oropharynx is clear and moist. No oropharyngeal exudate.  Eyes: Conjunctivae are normal. Right eye exhibits no discharge. Left eye exhibits no discharge. No scleral icterus.  Neck: Normal range of motion. Neck supple.  Cardiovascular: Normal rate, regular rhythm, normal heart sounds and intact distal pulses.   Pulmonary/Chest: Effort normal and breath sounds normal. No respiratory distress. No wheezes. No rales.  Abdominal: Soft. Bowel sounds are normal. Exhibits no distension and no mass. There is no tenderness.  Musculoskeletal: Normal range of motion.  Exhibits no edema.  Lymphadenopathy:    No cervical adenopathy.  Neurological: Alert and oriented to person, place, and time. Exhibits normal muscle tone. Gait normal. Coordination normal.  Skin: Skin is warm and dry. No rash noted. Not diaphoretic. No erythema. No pallor.  Psychiatric: Mood, memory and judgment normal.  Vitals reviewed.  LABORATORY DATA: Lab Results  Component Value Date   WBC 5.8 04/11/2020   HGB 10.1 (L) 04/11/2020   HCT 31.3 (L) 04/11/2020   MCV 102.0 (H) 04/11/2020   PLT 163 04/11/2020      Chemistry      Component Value Date/Time   NA 136 04/11/2020 1442   NA 141 08/06/2017 1306   K 4.6 04/11/2020 1442   K 4.3 08/06/2017 1306   CL 103 04/11/2020 1442   CO2 27 04/11/2020 1442   CO2 25 08/06/2017 1306   BUN 14 04/11/2020 1442   BUN 22.9 08/06/2017 1306   CREATININE 1.20 04/11/2020 1442   CREATININE 1.3 08/06/2017 1306       Component Value Date/Time   CALCIUM 8.5 (L) 04/11/2020 1442   CALCIUM 9.3 08/06/2017 1306   ALKPHOS 51 04/11/2020 1442   ALKPHOS 47 08/06/2017 1306   AST 10 (L) 04/11/2020 1442   AST 20 08/06/2017 1306   ALT 9 04/11/2020 1442   ALT 19 08/06/2017 1306   BILITOT 0.8 04/11/2020 1442   BILITOT 0.46 08/06/2017 1306       RADIOGRAPHIC STUDIES:  CT Abdomen Pelvis W Contrast  Result Date: 04/05/2020 CLINICAL DATA:  Chronic lymphocytic leukemia.  Weight loss. EXAM: CT ABDOMEN AND PELVIS WITH CONTRAST TECHNIQUE: Multidetector CT imaging of the abdomen and pelvis was performed using the standard protocol following bolus administration of intravenous contrast. CONTRAST:  174mL OMNIPAQUE IOHEXOL 300 MG/ML  SOLN COMPARISON:  Neck CT 01/05/2020 FINDINGS: Lower chest: Lung bases are clear. Hepatobiliary: No focal hepatic lesion. No biliary duct dilatation. Gallbladder is normal. Common bile duct is normal. Pancreas: Pancreas is normal. No ductal dilatation. No pancreatic inflammation. Spleen: spleen is globular and enlarged measuring 14.3 by 12.2 x 7.4 cm (volume = 680 cm^3). Small enhancing focus in the upper spleen measuring 6 mm on image 9/2 is nonspecific. Adrenals/urinary tract: Adrenal glands and kidneys are normal. Large 3.5 cm simple fluid cyst of the RIGHT kidney. The ureters and bladder normal. Stomach/Bowel: Stomach, small bowel, appendix, and cecum are normal. The colon and rectosigmoid colon are normal. Vascular/Lymphatic: Abdominal aorta normal caliber. Intimal calcifications present. Multiple minimally enlarged periaortic retroperitoneal lymph nodes are present. For example 12 mm node on image 33/2 LEFT of the aorta. Node positioned between the aorta and IVC measures 11 mm image 36/2. Minimal pelvic adenopathy. Enlarged LEFT external iliac lymph node measures 14 mm (image 68/2). There multiple small lymph nodes throughout the small bowel mesentery best seen on coronal image 39/6. Individual nodes  measure 8-10 mm (image 46/2). Reproductive: Prostate normal Other: No free fluid. Musculoskeletal: No aggressive osseous lesion. IMPRESSION: 1. Moderate splenomegaly. 2. Moderate periaortic retroperitoneal adenopathy and mesenteric adenopathy. 3. Findings consistent with chronic lymphocytic leukemia. Electronically Signed   By: Suzy Bouchard M.D.   On: 04/05/2020 16:45     ASSESSMENT/PLAN:  This is a very pleasant 79 year old Caucasian male diagnosed with chronic lymphocytic leukemia. He was diagnosed in 2014. He has been on observation since that time until April 2021   In January 2021, the patient noticed left cervical lymphadenopathy and increase in his total WBC count. His anemia worsened as  well.   He recently started systemic treatment with bendamustine 90 mg/M2 on days 1 and 2 and Rituxan 375 mg/M2 on day 1 every 4 weeks. He is status post his first cycle of treatment and tolerated it well without any adverse side effects.   The patient was seen with Dr. Julien Nordmann. Dr. Julien Nordmann also reviewed his recent CT scan of the abdomen and pelvis which noted moderate splenomegaly and periaortic, retroperitoneal, and mesenteric adenopathy.  Labs were reviewed. He has significant improvement in his WBC count compared to last week from 97k to 5k. Dr. Julien Nordmann recommends that he continue on the same treatment.   We will see him back for a follow up visit in 3 weeks for evaluation before starting cycle #2.   The patient was advised to call immediately if he has any concerning symptoms in the interval. The patient voices understanding of current disease status and treatment options and is in agreement with the current care plan. All questions were answered. The patient knows to call the clinic with any problems, questions or concerns. We can certainly see the patient much sooner if necessary    No orders of the defined types were placed in this encounter.    Taijah Macrae L Drue Harr,  PA-C 04/11/20  ADDENDUM: Hematology/Oncology Attending: I had a face-to-face encounter with the patient today.  I recommended his care plan.  This is a very pleasant 79 years old white male with chronic lymphocytic leukemia that has progressed recently.  The patient is started the first cycle of his treatment with bendamustine and Rituxan last week.  He has significant improvement in the total white blood count which decreased from [phone].  He also had a recent CT scan of the abdomen and pelvis by his gastroenterologist that showed lymphadenopathy as well as splenomegaly. I discussed the scan results with the patient and his wife today.  This was not surprising to see the lymphadenopathy in the abdomen and pelvis as it has been seen on the neck and chest in the past.  This is all consistent with his diagnosis with the CLL. The patient will come back for follow-up visit in 3 weeks for evaluation before starting cycle #2. He was advised to call immediately if he has any concerning symptoms in the interval.  Disclaimer: This note was dictated with voice recognition software. Similar sounding words can inadvertently be transcribed and may be missed upon review. Eilleen Kempf, MD 04/11/20

## 2020-04-18 ENCOUNTER — Inpatient Hospital Stay: Payer: Medicare HMO | Attending: Internal Medicine

## 2020-04-18 ENCOUNTER — Other Ambulatory Visit: Payer: Self-pay

## 2020-04-18 DIAGNOSIS — D630 Anemia in neoplastic disease: Secondary | ICD-10-CM | POA: Diagnosis not present

## 2020-04-18 DIAGNOSIS — E860 Dehydration: Secondary | ICD-10-CM | POA: Insufficient documentation

## 2020-04-18 DIAGNOSIS — Z5111 Encounter for antineoplastic chemotherapy: Secondary | ICD-10-CM | POA: Diagnosis present

## 2020-04-18 DIAGNOSIS — Z5112 Encounter for antineoplastic immunotherapy: Secondary | ICD-10-CM | POA: Diagnosis present

## 2020-04-18 DIAGNOSIS — C9110 Chronic lymphocytic leukemia of B-cell type not having achieved remission: Secondary | ICD-10-CM | POA: Diagnosis present

## 2020-04-18 DIAGNOSIS — B370 Candidal stomatitis: Secondary | ICD-10-CM | POA: Insufficient documentation

## 2020-04-18 DIAGNOSIS — R509 Fever, unspecified: Secondary | ICD-10-CM | POA: Diagnosis not present

## 2020-04-18 DIAGNOSIS — E876 Hypokalemia: Secondary | ICD-10-CM | POA: Insufficient documentation

## 2020-04-18 LAB — CMP (CANCER CENTER ONLY)
ALT: <6 U/L (ref 0–44)
AST: 7 U/L — ABNORMAL LOW (ref 15–41)
Albumin: 3.3 g/dL — ABNORMAL LOW (ref 3.5–5.0)
Alkaline Phosphatase: 59 U/L (ref 38–126)
Anion gap: 6 (ref 5–15)
BUN: 10 mg/dL (ref 8–23)
CO2: 27 mmol/L (ref 22–32)
Calcium: 8.5 mg/dL — ABNORMAL LOW (ref 8.9–10.3)
Chloride: 104 mmol/L (ref 98–111)
Creatinine: 1.06 mg/dL (ref 0.61–1.24)
GFR, Est AFR Am: >60 mL/min (ref >60)
GFR, Estimated: >60 mL/min (ref >60)
Glucose, Bld: 169 mg/dL — ABNORMAL HIGH (ref 70–99)
Potassium: 4.1 mmol/L (ref 3.5–5.1)
Sodium: 137 mmol/L (ref 135–145)
Total Bilirubin: 1 mg/dL (ref 0.3–1.2)
Total Protein: 5.7 g/dL — ABNORMAL LOW (ref 6.5–8.1)

## 2020-04-18 LAB — CBC WITH DIFFERENTIAL (CANCER CENTER ONLY)
Abs Immature Granulocytes: 0 10*3/uL (ref 0.00–0.07)
Band Neutrophils: 1 %
Basophils Absolute: 0 10*3/uL (ref 0.0–0.1)
Basophils Relative: 0 %
Eosinophils Absolute: 0.1 10*3/uL (ref 0.0–0.5)
Eosinophils Relative: 3 %
HCT: 26.1 % — ABNORMAL LOW (ref 39.0–52.0)
Hemoglobin: 8.7 g/dL — ABNORMAL LOW (ref 13.0–17.0)
Lymphocytes Relative: 46 %
Lymphs Abs: 1.2 10*3/uL (ref 0.7–4.0)
MCH: 33 pg (ref 26.0–34.0)
MCHC: 33.3 g/dL (ref 30.0–36.0)
MCV: 98.9 fL (ref 80.0–100.0)
Monocytes Absolute: 0.1 10*3/uL (ref 0.1–1.0)
Monocytes Relative: 4 %
Neutro Abs: 1.3 10*3/uL — ABNORMAL LOW (ref 1.7–7.7)
Neutrophils Relative %: 46 %
Platelet Count: 162 10*3/uL (ref 150–400)
RBC: 2.64 MIL/uL — ABNORMAL LOW (ref 4.22–5.81)
RDW: 14.3 % (ref 11.5–15.5)
WBC Count: 2.7 10*3/uL — ABNORMAL LOW (ref 4.0–10.5)
nRBC: 0 % (ref 0.0–0.2)

## 2020-04-18 LAB — LACTATE DEHYDROGENASE: LDH: 246 U/L — ABNORMAL HIGH (ref 98–192)

## 2020-04-18 LAB — URIC ACID: Uric Acid, Serum: 3.9 mg/dL (ref 3.7–8.6)

## 2020-04-21 ENCOUNTER — Other Ambulatory Visit: Payer: Self-pay | Admitting: Endocrinology

## 2020-04-25 ENCOUNTER — Other Ambulatory Visit: Payer: Self-pay

## 2020-04-25 ENCOUNTER — Inpatient Hospital Stay: Payer: Medicare HMO

## 2020-04-25 ENCOUNTER — Telehealth: Payer: Self-pay | Admitting: Medical Oncology

## 2020-04-25 DIAGNOSIS — C9110 Chronic lymphocytic leukemia of B-cell type not having achieved remission: Secondary | ICD-10-CM

## 2020-04-25 DIAGNOSIS — Z5112 Encounter for antineoplastic immunotherapy: Secondary | ICD-10-CM | POA: Diagnosis not present

## 2020-04-25 LAB — CBC WITH DIFFERENTIAL (CANCER CENTER ONLY)
Abs Immature Granulocytes: 0.01 K/uL (ref 0.00–0.07)
Basophils Absolute: 0 K/uL (ref 0.0–0.1)
Basophils Relative: 1 %
Eosinophils Absolute: 0.1 K/uL (ref 0.0–0.5)
Eosinophils Relative: 7 %
HCT: 23.3 % — ABNORMAL LOW (ref 39.0–52.0)
Hemoglobin: 7.5 g/dL — ABNORMAL LOW (ref 13.0–17.0)
Immature Granulocytes: 1 %
Lymphocytes Relative: 50 %
Lymphs Abs: 0.8 K/uL (ref 0.7–4.0)
MCH: 32.5 pg (ref 26.0–34.0)
MCHC: 32.2 g/dL (ref 30.0–36.0)
MCV: 100.9 fL — ABNORMAL HIGH (ref 80.0–100.0)
Monocytes Absolute: 0.2 K/uL (ref 0.1–1.0)
Monocytes Relative: 12 %
Neutro Abs: 0.5 K/uL — ABNORMAL LOW (ref 1.7–7.7)
Neutrophils Relative %: 29 %
Platelet Count: 76 K/uL — ABNORMAL LOW (ref 150–400)
RBC: 2.31 MIL/uL — ABNORMAL LOW (ref 4.22–5.81)
RDW: 14.3 % (ref 11.5–15.5)
WBC Count: 1.6 K/uL — ABNORMAL LOW (ref 4.0–10.5)
nRBC: 0 % (ref 0.0–0.2)

## 2020-04-25 LAB — CMP (CANCER CENTER ONLY)
ALT: 8 U/L (ref 0–44)
AST: 10 U/L — ABNORMAL LOW (ref 15–41)
Albumin: 2.9 g/dL — ABNORMAL LOW (ref 3.5–5.0)
Alkaline Phosphatase: 58 U/L (ref 38–126)
Anion gap: 5 (ref 5–15)
BUN: 12 mg/dL (ref 8–23)
CO2: 28 mmol/L (ref 22–32)
Calcium: 8 mg/dL — ABNORMAL LOW (ref 8.9–10.3)
Chloride: 103 mmol/L (ref 98–111)
Creatinine: 1.03 mg/dL (ref 0.61–1.24)
GFR, Est AFR Am: >60 mL/min (ref >60)
GFR, Estimated: >60 mL/min (ref >60)
Glucose, Bld: 174 mg/dL — ABNORMAL HIGH (ref 70–99)
Potassium: 4.3 mmol/L (ref 3.5–5.1)
Sodium: 136 mmol/L (ref 135–145)
Total Bilirubin: 0.8 mg/dL (ref 0.3–1.2)
Total Protein: 5.1 g/dL — ABNORMAL LOW (ref 6.5–8.1)

## 2020-04-25 NOTE — Telephone Encounter (Signed)
Pt  tired. He wants a blood transfusion. "The last two bags of blood  really helped me a lot". Schedule message sent.

## 2020-04-25 NOTE — Telephone Encounter (Signed)
-----   Message from Curt Bears, MD sent at 04/25/2020  1:23 PM EDT ----- He may need 1 or 2 units of PRBCs transfusion. ----- Message ----- From: Buel Ream, Lab [facility] [place] Sent: 04/25/2020  11:34 AM EDT To: Curt Bears, MD

## 2020-04-26 ENCOUNTER — Telehealth: Payer: Self-pay | Admitting: Internal Medicine

## 2020-04-26 NOTE — Progress Notes (Signed)
Pharmacist Chemotherapy Monitoring - Follow Up Assessment    I verify that I have reviewed each item in the below checklist:  . Regimen for the patient is scheduled for the appropriate day and plan matches scheduled date. Marland Kitchen Appropriate non-routine labs are ordered dependent on drug ordered. . If applicable, additional medications reviewed and ordered per protocol based on lifetime cumulative doses and/or treatment regimen.   Plan for follow-up and/or issues identified: No . I-vent associated with next due treatment: No . MD and/or nursing notified: No  Courtlynn Holloman D 04/26/2020 11:33 AM

## 2020-04-26 NOTE — Telephone Encounter (Signed)
Scheduled appt per 5/11 sch message - pt wife aware of appts 5/13 and 5/15

## 2020-04-27 ENCOUNTER — Other Ambulatory Visit: Payer: Self-pay | Admitting: Physician Assistant

## 2020-04-27 ENCOUNTER — Other Ambulatory Visit: Payer: Self-pay

## 2020-04-27 ENCOUNTER — Other Ambulatory Visit: Payer: Self-pay | Admitting: Medical Oncology

## 2020-04-27 ENCOUNTER — Inpatient Hospital Stay: Payer: Medicare HMO

## 2020-04-27 ENCOUNTER — Telehealth: Payer: Self-pay | Admitting: Medical Oncology

## 2020-04-27 DIAGNOSIS — D649 Anemia, unspecified: Secondary | ICD-10-CM

## 2020-04-27 DIAGNOSIS — C9110 Chronic lymphocytic leukemia of B-cell type not having achieved remission: Secondary | ICD-10-CM

## 2020-04-27 DIAGNOSIS — Z5112 Encounter for antineoplastic immunotherapy: Secondary | ICD-10-CM | POA: Diagnosis not present

## 2020-04-27 NOTE — Telephone Encounter (Deleted)
Pt feels bad , he is having diarrhea , thinks he  Needs blood. He has blood scheduled for Saturday . He said he has his blue armband from tuesday.

## 2020-04-27 NOTE — Progress Notes (Signed)
Pharmacist Chemotherapy Monitoring - Follow Up Assessment    I verify that I have reviewed each item in the below checklist:  . Regimen for the patient is scheduled for the appropriate day and plan matches scheduled date. Marland Kitchen Appropriate non-routine labs are ordered dependent on drug ordered. . If applicable, additional medications reviewed and ordered per protocol based on lifetime cumulative doses and/or treatment regimen.   Plan for follow-up and/or issues identified: No . I-vent associated with next due treatment: No . MD and/or nursing notified: No  Gwenda Heiner K 04/27/2020 11:25 AM

## 2020-04-28 ENCOUNTER — Other Ambulatory Visit: Payer: Self-pay | Admitting: Internal Medicine

## 2020-04-28 DIAGNOSIS — C9110 Chronic lymphocytic leukemia of B-cell type not having achieved remission: Secondary | ICD-10-CM

## 2020-04-28 NOTE — Telephone Encounter (Signed)
Type and cross in process. Blood transfusion scheduled for tomorrow.

## 2020-04-29 ENCOUNTER — Other Ambulatory Visit: Payer: Self-pay

## 2020-04-29 ENCOUNTER — Inpatient Hospital Stay: Payer: Medicare HMO

## 2020-04-29 DIAGNOSIS — C9110 Chronic lymphocytic leukemia of B-cell type not having achieved remission: Secondary | ICD-10-CM

## 2020-04-29 DIAGNOSIS — Z5112 Encounter for antineoplastic immunotherapy: Secondary | ICD-10-CM | POA: Diagnosis not present

## 2020-04-29 MED ORDER — SODIUM CHLORIDE 0.9% IV SOLUTION
250.0000 mL | Freq: Once | INTRAVENOUS | Status: AC
  Administered 2020-04-29: 250 mL via INTRAVENOUS
  Filled 2020-04-29: qty 250

## 2020-04-29 MED ORDER — ACETAMINOPHEN 325 MG PO TABS
650.0000 mg | ORAL_TABLET | Freq: Once | ORAL | Status: AC
  Administered 2020-04-29: 650 mg via ORAL

## 2020-04-29 MED ORDER — DIPHENHYDRAMINE HCL 25 MG PO CAPS
25.0000 mg | ORAL_CAPSULE | Freq: Once | ORAL | Status: AC
  Administered 2020-04-29: 25 mg via ORAL

## 2020-04-29 MED ORDER — ACETAMINOPHEN 325 MG PO TABS
ORAL_TABLET | ORAL | Status: AC
  Filled 2020-04-29: qty 2

## 2020-04-29 MED ORDER — DIPHENHYDRAMINE HCL 25 MG PO CAPS
ORAL_CAPSULE | ORAL | Status: AC
  Filled 2020-04-29: qty 1

## 2020-04-29 NOTE — Patient Instructions (Signed)

## 2020-04-30 LAB — TYPE AND SCREEN
ABO/RH(D): A POS
Antibody Screen: POSITIVE
DAT, IgG: POSITIVE
Unit division: 0
Unit division: 0

## 2020-04-30 LAB — BPAM RBC
Blood Product Expiration Date: 202105292359
Blood Product Expiration Date: 202106152359
ISSUE DATE / TIME: 202105150954
ISSUE DATE / TIME: 202105150954
Unit Type and Rh: 6200
Unit Type and Rh: 6200

## 2020-05-01 ENCOUNTER — Telehealth: Payer: Self-pay | Admitting: Internal Medicine

## 2020-05-01 ENCOUNTER — Other Ambulatory Visit: Payer: Medicare HMO

## 2020-05-01 ENCOUNTER — Ambulatory Visit: Payer: Medicare HMO | Admitting: Internal Medicine

## 2020-05-01 NOTE — Telephone Encounter (Signed)
Scheduled appt per 5/17 sch message  - pt aware of appt added

## 2020-05-02 ENCOUNTER — Other Ambulatory Visit: Payer: Self-pay

## 2020-05-02 ENCOUNTER — Encounter: Payer: Self-pay | Admitting: Internal Medicine

## 2020-05-02 ENCOUNTER — Inpatient Hospital Stay: Payer: Medicare HMO | Admitting: Internal Medicine

## 2020-05-02 ENCOUNTER — Inpatient Hospital Stay: Payer: Medicare HMO

## 2020-05-02 VITALS — BP 128/62 | HR 54 | Temp 98.1°F | Resp 17

## 2020-05-02 VITALS — BP 135/63 | HR 54 | Temp 98.0°F | Resp 18 | Ht 67.5 in | Wt 141.8 lb

## 2020-05-02 DIAGNOSIS — I10 Essential (primary) hypertension: Secondary | ICD-10-CM | POA: Diagnosis not present

## 2020-05-02 DIAGNOSIS — Z5112 Encounter for antineoplastic immunotherapy: Secondary | ICD-10-CM | POA: Diagnosis not present

## 2020-05-02 DIAGNOSIS — Z5111 Encounter for antineoplastic chemotherapy: Secondary | ICD-10-CM

## 2020-05-02 DIAGNOSIS — C9110 Chronic lymphocytic leukemia of B-cell type not having achieved remission: Secondary | ICD-10-CM

## 2020-05-02 LAB — CMP (CANCER CENTER ONLY)
ALT: 7 U/L (ref 0–44)
AST: 12 U/L — ABNORMAL LOW (ref 15–41)
Albumin: 3.2 g/dL — ABNORMAL LOW (ref 3.5–5.0)
Alkaline Phosphatase: 62 U/L (ref 38–126)
Anion gap: 10 (ref 5–15)
BUN: 10 mg/dL (ref 8–23)
CO2: 27 mmol/L (ref 22–32)
Calcium: 8.6 mg/dL — ABNORMAL LOW (ref 8.9–10.3)
Chloride: 103 mmol/L (ref 98–111)
Creatinine: 0.89 mg/dL (ref 0.61–1.24)
GFR, Est AFR Am: >60 mL/min (ref >60)
GFR, Estimated: >60 mL/min (ref >60)
Glucose, Bld: 89 mg/dL (ref 70–99)
Potassium: 3.8 mmol/L (ref 3.5–5.1)
Sodium: 140 mmol/L (ref 135–145)
Total Bilirubin: 0.8 mg/dL (ref 0.3–1.2)
Total Protein: 5.5 g/dL — ABNORMAL LOW (ref 6.5–8.1)

## 2020-05-02 LAB — CBC WITH DIFFERENTIAL (CANCER CENTER ONLY)
Abs Immature Granulocytes: 0.01 10*3/uL (ref 0.00–0.07)
Basophils Absolute: 0 10*3/uL (ref 0.0–0.1)
Basophils Relative: 1 %
Eosinophils Absolute: 0.4 10*3/uL (ref 0.0–0.5)
Eosinophils Relative: 11 %
HCT: 31.4 % — ABNORMAL LOW (ref 39.0–52.0)
Hemoglobin: 10.3 g/dL — ABNORMAL LOW (ref 13.0–17.0)
Immature Granulocytes: 0 %
Lymphocytes Relative: 41 %
Lymphs Abs: 1.5 10*3/uL (ref 0.7–4.0)
MCH: 31.6 pg (ref 26.0–34.0)
MCHC: 32.8 g/dL (ref 30.0–36.0)
MCV: 96.3 fL (ref 80.0–100.0)
Monocytes Absolute: 0.3 10*3/uL (ref 0.1–1.0)
Monocytes Relative: 9 %
Neutro Abs: 1.4 10*3/uL — ABNORMAL LOW (ref 1.7–7.7)
Neutrophils Relative %: 38 %
Platelet Count: 144 10*3/uL — ABNORMAL LOW (ref 150–400)
RBC: 3.26 MIL/uL — ABNORMAL LOW (ref 4.22–5.81)
RDW: 15 % (ref 11.5–15.5)
WBC Count: 3.7 10*3/uL — ABNORMAL LOW (ref 4.0–10.5)
nRBC: 0 % (ref 0.0–0.2)

## 2020-05-02 LAB — LACTATE DEHYDROGENASE: LDH: 242 U/L — ABNORMAL HIGH (ref 98–192)

## 2020-05-02 LAB — URIC ACID: Uric Acid, Serum: 3 mg/dL — ABNORMAL LOW (ref 3.7–8.6)

## 2020-05-02 MED ORDER — SODIUM CHLORIDE 0.9 % IV SOLN
Freq: Once | INTRAVENOUS | Status: AC
  Filled 2020-05-02: qty 250

## 2020-05-02 MED ORDER — ACETAMINOPHEN 325 MG PO TABS
ORAL_TABLET | ORAL | Status: AC
  Filled 2020-05-02: qty 2

## 2020-05-02 MED ORDER — SODIUM CHLORIDE 0.9 % IV SOLN: 90.0000 mg/m2 | Freq: Once | INTRAVENOUS | Status: DC

## 2020-05-02 MED ORDER — SODIUM CHLORIDE 0.9 % IV SOLN
90.0000 mg/m2 | Freq: Once | INTRAVENOUS | Status: AC
  Administered 2020-05-02: 150 mg via INTRAVENOUS
  Filled 2020-05-02: qty 6

## 2020-05-02 MED ORDER — SODIUM CHLORIDE 0.9 % IV SOLN
10.0000 mg | Freq: Once | INTRAVENOUS | Status: AC
  Administered 2020-05-02: 10 mg via INTRAVENOUS
  Filled 2020-05-02: qty 10

## 2020-05-02 MED ORDER — DIPHENHYDRAMINE HCL 25 MG PO CAPS
ORAL_CAPSULE | ORAL | Status: AC
  Filled 2020-05-02: qty 2

## 2020-05-02 MED ORDER — PALONOSETRON HCL INJECTION 0.25 MG/5ML
INTRAVENOUS | Status: AC
  Filled 2020-05-02: qty 5

## 2020-05-02 MED ORDER — SODIUM CHLORIDE 0.9 % IV SOLN
375.0000 mg/m2 | Freq: Once | INTRAVENOUS | Status: AC
  Administered 2020-05-02: 700 mg via INTRAVENOUS
  Filled 2020-05-02: qty 20

## 2020-05-02 MED ORDER — SODIUM CHLORIDE 0.9 % IV SOLN: 375.0000 mg/m2 | Freq: Once | INTRAVENOUS | Status: DC

## 2020-05-02 MED ORDER — ACETAMINOPHEN 325 MG PO TABS
650.0000 mg | ORAL_TABLET | Freq: Once | ORAL | Status: AC
  Administered 2020-05-02: 650 mg via ORAL

## 2020-05-02 MED ORDER — PALONOSETRON HCL INJECTION 0.25 MG/5ML
0.2500 mg | Freq: Once | INTRAVENOUS | Status: AC
  Administered 2020-05-02: 0.25 mg via INTRAVENOUS

## 2020-05-02 MED ORDER — DIPHENHYDRAMINE HCL 25 MG PO CAPS
50.0000 mg | ORAL_CAPSULE | Freq: Once | ORAL | Status: AC
  Administered 2020-05-02: 50 mg via ORAL

## 2020-05-02 NOTE — Progress Notes (Signed)
Okay to treat with ANC 1.4 per Dr. Mohamed. 

## 2020-05-02 NOTE — Progress Notes (Signed)
[place] [phone]   [phone]  OFFICE PROGRESS NOTE  Elayne Snare, MD [address] [place] [address]  DIAGNOSIS: Chronic lymphocytic leukemia diagnosed in October 2014.  PRIOR THERAPY: None  CURRENT THERAPY: Systemic chemotherapy with bendamustine 90 mg/M2 on days 1 and 2 and Rituxan 375 mg/M2 on day 1 every 4 weeks.  First dose 04/04/2020.  Status post 1 cycle.  INTERVAL HISTORY: Tony Marshall 79 y.o. male returns to the clinic today for follow-up visit accompanied by his wife.  The patient is feeling fine today with no concerning complaints.  He noticed improvement in the cervical lymphadenopathy after the first cycle of his treatment.  He continues to have mild fatigue and he received 2 units of PRBCs transfusion last week because of the anemia.  The patient denied having any current chest pain, shortness of breath, cough or hemoptysis.  He denied having any fever or chills.  He has no nausea, vomiting, diarrhea or constipation.  He is here today for evaluation before starting cycle #2 of his treatment.  MEDICAL HISTORY: Past Medical History:  Diagnosis Date  . CLL (chronic lymphocytic leukemia) ([place]) dx'd 2014   progression 12/2019  . Diabetes mellitus without complication ([place])   . Diverticulosis   . External hemorrhoids   . GERD (gastroesophageal reflux disease)   . Hiatal hernia   . History of colon polyps   . Hyperlipidemia   . Hypertension   . Internal hemorrhoids   . Lymphocytosis   . Schatzki's ring   . Sleep apnea     ALLERGIES:  is allergic to glycopyrrolate.  MEDICATIONS:  Current Outpatient Medications  Medication Sig Dispense Refill  . allopurinol (ZYLOPRIM) 100 MG tablet Take 1 tablet (100 mg total) by mouth 2 (two) times daily. 60 tablet 2  . aspirin 81 MG EC tablet Take 81 mg by mouth daily. Swallow whole.    . benazepril (LOTENSIN) 20 MG tablet TAKE 1 TABLET BY MOUTH EVERY DAY 90 tablet 1    . carvedilol (COREG) 25 MG tablet TAKE ONE TABLET BY MOUTH TWICE DAILY  180 tablet 0  . doxazosin (CARDURA) 4 MG tablet Take 1 tablet (4 mg total) by mouth at bedtime. 90 tablet 0  . esomeprazole (NEXIUM) 40 MG capsule TAKE 1 CAPSULE BY MOUTH EVERY MORNING 30 MINUTES BEFORE BREAKFAST 90 capsule 0  . ferrous sulfate 325 (65 FE) MG EC tablet TAKE 1 TABLET BY MOUTH 3 TIMES A DAY WITH MEALS (Patient taking differently: Take 325 mg by mouth 2 (two) times daily. ) 90 tablet 0  . glucagon (GLUCAGON EMERGENCY) 1 MG injection Inject 1 mg into the skin once as needed (For severe low blood sugar). 1 each 5  . ibuprofen (ADVIL) 200 MG tablet Take 200 mg by mouth every 6 (six) hours as needed for headache or mild pain.    Marland Kitchen insulin aspart (NOVOLOG FLEXPEN) 100 UNIT/ML FlexPen Inject 3-4 Units into the skin 3 (three) times daily with meals. Inject 3-4 units under the skin three times daily before meals.    . insulin degludec (TRESIBA FLEXTOUCH) 100 UNIT/ML SOPN FlexTouch Pen inject 12 units daily. if fasting blood sugars are over 140 increase dosage up to 15 units daily as directed. (Patient taking differently: 14 Units. inject 14 units daily. if fasting blood sugars are over 140 increase dosage up to 15 units daily as directed.) 4 pen 4  . metFORMIN (GLUCOPHAGE-XR) 500 MG 24  hr tablet Take 1 tablet (500 mg total) by mouth daily with supper. 90 tablet 3  . methylPREDNISolone (MEDROL DOSEPAK) 4 MG TBPK tablet Use as instructed. 21 tablet 0  . ONETOUCH VERIO test strip USE 1 STRIP TO CHECK GLUCOSE THREE TIMES DAILY 100 each 5  . pioglitazone (ACTOS) 15 MG tablet TAKE 1 TABLET BY MOUTH EVERY DAY 90 tablet 1  . prochlorperazine (COMPAZINE) 10 MG tablet Take 1 tablet (10 mg total) by mouth every 6 (six) hours as needed for nausea or vomiting. 30 tablet 0  . rosuvastatin (CRESTOR) 5 MG tablet TAKE 1 TABLET BY MOUTH EVERY DAY 90 tablet 0  . saccharomyces boulardii (FLORASTOR) 250 MG capsule Take 250 mg by mouth 2 (two)  times daily.    . sildenafil (REVATIO) 20 MG tablet TAKE 5 TABLETS BY MOUTH AS NEEDED once a week. 30 tablet 2  . amLODipine (NORVASC) 5 MG tablet Take 1 tablet (5 mg total) by mouth daily. (Patient not taking: Reported on 05/02/2020) 90 tablet 0   No current facility-administered medications for this visit.    SURGICAL HISTORY:  Past Surgical History:  Procedure Laterality Date  . BLEPHAROPLASTY    . COLONOSCOPY  2012  . HEMORRHOID SURGERY     Sclerotherapy  . KNEE SURGERY Right 2002  . POLYPECTOMY    . SHOULDER SURGERY Right 2007    REVIEW OF SYSTEMS:  A comprehensive review of systems was negative except for: Constitutional: positive for fatigue   PHYSICAL EXAMINATION: General appearance: alert, cooperative, fatigued and no distress Head: Normocephalic, without obvious abnormality, atraumatic Neck: no JVD, supple, symmetrical, trachea midline and thyroid not enlarged, symmetric, no tenderness/mass/nodules Lymph nodes: Cervical, supraclavicular, and axillary nodes normal. Resp: clear to auscultation bilaterally Back: symmetric, no curvature. ROM normal. No CVA tenderness. Cardio: regular rate and rhythm, S1, S2 normal, no murmur, click, rub or gallop GI: soft, non-tender; bowel sounds normal; no masses,  no organomegaly Extremities: extremities normal, atraumatic, no cyanosis or edema  ECOG PERFORMANCE STATUS: 1 - Symptomatic but completely ambulatory  Blood pressure 135/63, pulse (!) 54, temperature 98 F (36.7 C), temperature source Temporal, resp. rate 18, height 5' 7.5" (1.715 m), weight 141 lb 12.8 oz (64.3 kg), SpO2 100 %.  LABORATORY DATA: Lab Results  Component Value Date   WBC 3.7 (L) 05/02/2020   HGB 10.3 (L) 05/02/2020   HCT 31.4 (L) 05/02/2020   MCV 96.3 05/02/2020   PLT 144 (L) 05/02/2020      Chemistry      Component Value Date/Time   NA 136 04/25/2020 1123   NA 141 08/06/2017 1306   K 4.3 04/25/2020 1123   K 4.3 08/06/2017 1306   CL 103 04/25/2020  1123   CO2 28 04/25/2020 1123   CO2 25 08/06/2017 1306   BUN 12 04/25/2020 1123   BUN 22.9 08/06/2017 1306   CREATININE 1.03 04/25/2020 1123   CREATININE 1.3 08/06/2017 1306      Component Value Date/Time   CALCIUM 8.0 (L) 04/25/2020 1123   CALCIUM 9.3 08/06/2017 1306   ALKPHOS 58 04/25/2020 1123   ALKPHOS 47 08/06/2017 1306   AST 10 (L) 04/25/2020 1123   AST 20 08/06/2017 1306   ALT 8 04/25/2020 1123   ALT 19 08/06/2017 1306   BILITOT 0.8 04/25/2020 1123   BILITOT 0.46 08/06/2017 1306       RADIOGRAPHIC STUDIES: CT Abdomen Pelvis W Contrast  Result Date: 04/05/2020 CLINICAL DATA:  Chronic lymphocytic leukemia.  Weight loss. EXAM:  CT ABDOMEN AND PELVIS WITH CONTRAST TECHNIQUE: Multidetector CT imaging of the abdomen and pelvis was performed using the standard protocol following bolus administration of intravenous contrast. CONTRAST:  176mL OMNIPAQUE IOHEXOL 300 MG/ML  SOLN COMPARISON:  Neck CT 01/05/2020 FINDINGS: Lower chest: Lung bases are clear. Hepatobiliary: No focal hepatic lesion. No biliary duct dilatation. Gallbladder is normal. Common bile duct is normal. Pancreas: Pancreas is normal. No ductal dilatation. No pancreatic inflammation. Spleen: spleen is globular and enlarged measuring 14.3 by 12.2 x 7.4 cm (volume = 680 cm^3). Small enhancing focus in the upper spleen measuring 6 mm on image 9/2 is nonspecific. Adrenals/urinary tract: Adrenal glands and kidneys are normal. Large 3.5 cm simple fluid cyst of the RIGHT kidney. The ureters and bladder normal. Stomach/Bowel: Stomach, small bowel, appendix, and cecum are normal. The colon and rectosigmoid colon are normal. Vascular/Lymphatic: Abdominal aorta normal caliber. Intimal calcifications present. Multiple minimally enlarged periaortic retroperitoneal lymph nodes are present. For example 12 mm node on image 33/2 LEFT of the aorta. Node positioned between the aorta and IVC measures 11 mm image 36/2. Minimal pelvic adenopathy.  Enlarged LEFT external iliac lymph node measures 14 mm (image 68/2). There multiple small lymph nodes throughout the small bowel mesentery best seen on coronal image 39/6. Individual nodes measure 8-10 mm (image 46/2). Reproductive: Prostate normal Other: No free fluid. Musculoskeletal: No aggressive osseous lesion. IMPRESSION: 1. Moderate splenomegaly. 2. Moderate periaortic retroperitoneal adenopathy and mesenteric adenopathy. 3. Findings consistent with chronic lymphocytic leukemia. Electronically Signed   By: Suzy Bouchard M.D.   On: 04/05/2020 16:45    ASSESSMENT AND PLAN:  This is a very pleasant 79 years old white male with chronic lymphocytic leukemia currently on observation. The patient felt a mass in the left posterior neck area over the last few weeks but started getting smaller in size. The patient had further increase in his total white blood count close to 100,000.  He also started having worsening anemia likely secondary to the underlying CLL.  His platelets count are still normal. The patient is started systemic chemotherapy with bendamustine and Rituxan status post 1 cycle.  He tolerated the first cycle of his treatment fairly well except for fatigue. I recommended for the patient to proceed with cycle #2 today as planned. For the anemia of neoplastic disease, we will continue to monitor his blood count closely and consider the patient for transfusion if needed. For the tumor lysis prophylaxis, will start the patient on allopurinol 100 mg p.o. twice daily. The patient will come back for follow-up visit in 4 weeks for evaluation before the next cycle of his treatment.  He will continue to have labs every other week during the course of his treatment. The patient was advised to call immediately if he has any concerning symptoms in the interval. The patient voices understanding of current disease status and treatment options and is in agreement with the current care plan. All  questions were answered. The patient knows to call the clinic with any problems, questions or concerns. We can certainly see the patient much sooner if necessary.  Disclaimer: This note was dictated with voice recognition software. Similar sounding words can inadvertently be transcribed and may not be corrected upon review.

## 2020-05-02 NOTE — Progress Notes (Signed)
weight loss; MD ok'd dose adj of Bendeka. MD did also ok change to rapid Ruxience.   Kennith Center, Pharm.D., CPP 05/02/2020@1 :03 PM

## 2020-05-03 ENCOUNTER — Inpatient Hospital Stay: Payer: Medicare HMO

## 2020-05-03 ENCOUNTER — Other Ambulatory Visit: Payer: Self-pay

## 2020-05-03 VITALS — BP 126/64 | HR 54 | Temp 98.2°F | Resp 18

## 2020-05-03 DIAGNOSIS — C9110 Chronic lymphocytic leukemia of B-cell type not having achieved remission: Secondary | ICD-10-CM

## 2020-05-03 DIAGNOSIS — Z5112 Encounter for antineoplastic immunotherapy: Secondary | ICD-10-CM | POA: Diagnosis not present

## 2020-05-03 MED ORDER — SODIUM CHLORIDE 0.9 % IV SOLN
10.0000 mg | Freq: Once | INTRAVENOUS | Status: AC
  Administered 2020-05-03: 10 mg via INTRAVENOUS
  Filled 2020-05-03: qty 10

## 2020-05-03 MED ORDER — SODIUM CHLORIDE 0.9 % IV SOLN
Freq: Once | INTRAVENOUS | Status: AC
  Filled 2020-05-03: qty 250

## 2020-05-03 MED ORDER — SODIUM CHLORIDE 0.9 % IV SOLN
90.0000 mg/m2 | Freq: Once | INTRAVENOUS | Status: AC
  Administered 2020-05-03: 150 mg via INTRAVENOUS
  Filled 2020-05-03: qty 6

## 2020-05-03 NOTE — Patient Instructions (Signed)
[facility] Discharge Instructions for Patients Receiving Chemotherapy  Today you received the following chemotherapy agents Bendeka.  To help prevent nausea and vomiting after your treatment, we encourage you to take your nausea medication as directed.   If you develop nausea and vomiting that is not controlled by your nausea medication, call the clinic.   BELOW ARE SYMPTOMS THAT SHOULD BE REPORTED IMMEDIATELY:  *FEVER GREATER THAN 100.5 F  *CHILLS WITH OR WITHOUT FEVER  NAUSEA AND VOMITING THAT IS NOT CONTROLLED WITH YOUR NAUSEA MEDICATION  *UNUSUAL SHORTNESS OF BREATH  *UNUSUAL BRUISING OR BLEEDING  TENDERNESS IN MOUTH AND THROAT WITH OR WITHOUT PRESENCE OF ULCERS  *URINARY PROBLEMS  *BOWEL PROBLEMS  UNUSUAL RASH Items with * indicate a potential emergency and should be followed up as soon as possible.  Feel free to call the clinic should you have any questions or concerns. The clinic phone number is [phone].  Please show the CHEMO ALERT CARD at check-in to the Emergency Department and triage nurse.   

## 2020-05-09 ENCOUNTER — Inpatient Hospital Stay: Payer: Medicare HMO

## 2020-05-09 ENCOUNTER — Other Ambulatory Visit: Payer: Self-pay

## 2020-05-09 DIAGNOSIS — Z5112 Encounter for antineoplastic immunotherapy: Secondary | ICD-10-CM | POA: Diagnosis not present

## 2020-05-09 DIAGNOSIS — C9110 Chronic lymphocytic leukemia of B-cell type not having achieved remission: Secondary | ICD-10-CM

## 2020-05-09 LAB — CBC WITH DIFFERENTIAL (CANCER CENTER ONLY)
Abs Immature Granulocytes: 0.05 10*3/uL (ref 0.00–0.07)
Basophils Absolute: 0 10*3/uL (ref 0.0–0.1)
Basophils Relative: 1 %
Eosinophils Absolute: 0.1 10*3/uL (ref 0.0–0.5)
Eosinophils Relative: 3 %
HCT: 32 % — ABNORMAL LOW (ref 39.0–52.0)
Hemoglobin: 10.8 g/dL — ABNORMAL LOW (ref 13.0–17.0)
Immature Granulocytes: 2 %
Lymphocytes Relative: 57 %
Lymphs Abs: 1.8 10*3/uL (ref 0.7–4.0)
MCH: 32 pg (ref 26.0–34.0)
MCHC: 33.8 g/dL (ref 30.0–36.0)
MCV: 94.7 fL (ref 80.0–100.0)
Monocytes Absolute: 0.2 10*3/uL (ref 0.1–1.0)
Monocytes Relative: 8 %
Neutro Abs: 0.9 10*3/uL — ABNORMAL LOW (ref 1.7–7.7)
Neutrophils Relative %: 29 %
Platelet Count: 219 10*3/uL (ref 150–400)
RBC: 3.38 MIL/uL — ABNORMAL LOW (ref 4.22–5.81)
RDW: 14.9 % (ref 11.5–15.5)
WBC Count: 3.1 10*3/uL — ABNORMAL LOW (ref 4.0–10.5)
nRBC: 0 % (ref 0.0–0.2)

## 2020-05-09 LAB — CMP (CANCER CENTER ONLY)
ALT: 7 U/L (ref 0–44)
AST: 11 U/L — ABNORMAL LOW (ref 15–41)
Albumin: 3 g/dL — ABNORMAL LOW (ref 3.5–5.0)
Alkaline Phosphatase: 52 U/L (ref 38–126)
Anion gap: 11 (ref 5–15)
BUN: 16 mg/dL (ref 8–23)
CO2: 31 mmol/L (ref 22–32)
Calcium: 8.3 mg/dL — ABNORMAL LOW (ref 8.9–10.3)
Chloride: 93 mmol/L — ABNORMAL LOW (ref 98–111)
Creatinine: 0.97 mg/dL (ref 0.61–1.24)
GFR, Est AFR Am: >60 mL/min (ref >60)
GFR, Estimated: >60 mL/min (ref >60)
Glucose, Bld: 218 mg/dL — ABNORMAL HIGH (ref 70–99)
Potassium: 3.4 mmol/L — ABNORMAL LOW (ref 3.5–5.1)
Sodium: 135 mmol/L (ref 135–145)
Total Bilirubin: 1 mg/dL (ref 0.3–1.2)
Total Protein: 5.4 g/dL — ABNORMAL LOW (ref 6.5–8.1)

## 2020-05-09 LAB — URIC ACID: Uric Acid, Serum: 4.2 mg/dL (ref 3.7–8.6)

## 2020-05-10 ENCOUNTER — Other Ambulatory Visit: Payer: Self-pay

## 2020-05-10 ENCOUNTER — Telehealth: Payer: Self-pay | Admitting: Medical Oncology

## 2020-05-10 ENCOUNTER — Inpatient Hospital Stay: Payer: Medicare HMO

## 2020-05-10 ENCOUNTER — Other Ambulatory Visit: Payer: Self-pay | Admitting: Medical Oncology

## 2020-05-10 DIAGNOSIS — Z9189 Other specified personal risk factors, not elsewhere classified: Secondary | ICD-10-CM

## 2020-05-10 DIAGNOSIS — Z5112 Encounter for antineoplastic immunotherapy: Secondary | ICD-10-CM | POA: Diagnosis not present

## 2020-05-10 LAB — LACTATE DEHYDROGENASE: LDH: 227 U/L — ABNORMAL HIGH (ref 98–192)

## 2020-05-10 MED ORDER — SODIUM CHLORIDE 0.9 % IV SOLN
Freq: Once | INTRAVENOUS | Status: AC
  Filled 2020-05-10: qty 250

## 2020-05-10 NOTE — Patient Instructions (Signed)

## 2020-05-10 NOTE — Telephone Encounter (Signed)
No appetite - only 1 bottle of water only 3 sops of milkshake yesterday ,weak-per Tony Marshall schedule pts for IVF today. Schedule message sent.

## 2020-05-12 ENCOUNTER — Other Ambulatory Visit: Payer: Self-pay

## 2020-05-12 ENCOUNTER — Inpatient Hospital Stay (HOSPITAL_BASED_OUTPATIENT_CLINIC_OR_DEPARTMENT_OTHER): Payer: Medicare HMO | Admitting: Medical

## 2020-05-12 ENCOUNTER — Other Ambulatory Visit: Payer: Self-pay | Admitting: Emergency Medicine

## 2020-05-12 ENCOUNTER — Other Ambulatory Visit: Payer: Self-pay | Admitting: Medical Oncology

## 2020-05-12 ENCOUNTER — Telehealth: Payer: Self-pay | Admitting: Medical Oncology

## 2020-05-12 VITALS — BP 147/79 | HR 75 | Temp 98.6°F | Resp 18 | Ht 67.5 in | Wt 131.9 lb

## 2020-05-12 DIAGNOSIS — R509 Fever, unspecified: Secondary | ICD-10-CM | POA: Diagnosis not present

## 2020-05-12 DIAGNOSIS — C9110 Chronic lymphocytic leukemia of B-cell type not having achieved remission: Secondary | ICD-10-CM

## 2020-05-12 DIAGNOSIS — E876 Hypokalemia: Secondary | ICD-10-CM | POA: Diagnosis not present

## 2020-05-12 DIAGNOSIS — B370 Candidal stomatitis: Secondary | ICD-10-CM

## 2020-05-12 DIAGNOSIS — Z5112 Encounter for antineoplastic immunotherapy: Secondary | ICD-10-CM | POA: Diagnosis not present

## 2020-05-12 LAB — CBC WITH DIFFERENTIAL (CANCER CENTER ONLY)
Abs Immature Granulocytes: 0.05 10*3/uL (ref 0.00–0.07)
Basophils Absolute: 0 10*3/uL (ref 0.0–0.1)
Basophils Relative: 1 %
Eosinophils Absolute: 0 10*3/uL (ref 0.0–0.5)
Eosinophils Relative: 1 %
HCT: 28.7 % — ABNORMAL LOW (ref 39.0–52.0)
Hemoglobin: 9.9 g/dL — ABNORMAL LOW (ref 13.0–17.0)
Immature Granulocytes: 1 %
Lymphocytes Relative: 46 %
Lymphs Abs: 1.8 10*3/uL (ref 0.7–4.0)
MCH: 32.2 pg (ref 26.0–34.0)
MCHC: 34.5 g/dL (ref 30.0–36.0)
MCV: 93.5 fL (ref 80.0–100.0)
Monocytes Absolute: 0.3 10*3/uL (ref 0.1–1.0)
Monocytes Relative: 8 %
Neutro Abs: 1.6 10*3/uL — ABNORMAL LOW (ref 1.7–7.7)
Neutrophils Relative %: 43 %
Platelet Count: 240 10*3/uL (ref 150–400)
RBC: 3.07 MIL/uL — ABNORMAL LOW (ref 4.22–5.81)
RDW: 15 % (ref 11.5–15.5)
WBC Count: 3.8 10*3/uL — ABNORMAL LOW (ref 4.0–10.5)
nRBC: 0 % (ref 0.0–0.2)

## 2020-05-12 LAB — CMP (CANCER CENTER ONLY)
ALT: 6 U/L (ref 0–44)
AST: 9 U/L — ABNORMAL LOW (ref 15–41)
Albumin: 2.7 g/dL — ABNORMAL LOW (ref 3.5–5.0)
Alkaline Phosphatase: 52 U/L (ref 38–126)
Anion gap: 14 (ref 5–15)
BUN: 11 mg/dL (ref 8–23)
CO2: 25 mmol/L (ref 22–32)
Calcium: 8 mg/dL — ABNORMAL LOW (ref 8.9–10.3)
Chloride: 94 mmol/L — ABNORMAL LOW (ref 98–111)
Creatinine: 0.82 mg/dL (ref 0.61–1.24)
GFR, Est AFR Am: >60 mL/min (ref >60)
GFR, Estimated: >60 mL/min (ref >60)
Glucose, Bld: 272 mg/dL — ABNORMAL HIGH (ref 70–99)
Potassium: 2.8 mmol/L — CL (ref 3.5–5.1)
Sodium: 133 mmol/L — ABNORMAL LOW (ref 135–145)
Total Bilirubin: 0.9 mg/dL (ref 0.3–1.2)
Total Protein: 4.9 g/dL — ABNORMAL LOW (ref 6.5–8.1)

## 2020-05-12 MED ORDER — LEVOFLOXACIN IN D5W 500 MG/100ML IV SOLN
500.0000 mg | Freq: Once | INTRAVENOUS | Status: AC
  Administered 2020-05-12: 500 mg via INTRAVENOUS
  Filled 2020-05-12: qty 100

## 2020-05-12 MED ORDER — POTASSIUM CHLORIDE CRYS ER 20 MEQ PO TBCR
EXTENDED_RELEASE_TABLET | ORAL | Status: AC
  Filled 2020-05-12: qty 2

## 2020-05-12 MED ORDER — FLUCONAZOLE 200 MG PO TABS: 200.0000 mg | ORAL_TABLET | Freq: Every day | ORAL | 0 refills | Status: DC

## 2020-05-12 MED ORDER — ACETAMINOPHEN 325 MG PO TABS
650.0000 mg | ORAL_TABLET | Freq: Once | ORAL | Status: AC
  Administered 2020-05-12: 650 mg via ORAL

## 2020-05-12 MED ORDER — ACETAMINOPHEN 325 MG PO TABS
ORAL_TABLET | ORAL | Status: AC
  Filled 2020-05-12: qty 2

## 2020-05-12 MED ORDER — SODIUM CHLORIDE 0.9 % IV SOLN
Freq: Once | INTRAVENOUS | Status: AC
  Filled 2020-05-12: qty 250

## 2020-05-12 MED ORDER — VANCOMYCIN HCL 1000 MG IV SOLR
1000.0000 mg | Freq: Once | INTRAVENOUS | Status: AC
  Administered 2020-05-12: 1000 mg via INTRAVENOUS
  Filled 2020-05-12: qty 1000

## 2020-05-12 MED ORDER — LEVOFLOXACIN 500 MG PO TABS: 500.0000 mg | ORAL_TABLET | Freq: Every day | ORAL | 0 refills | Status: DC

## 2020-05-12 MED ORDER — POTASSIUM CHLORIDE CRYS ER 20 MEQ PO TBCR: 20.0000 meq | EXTENDED_RELEASE_TABLET | Freq: Two times a day (BID) | ORAL | 0 refills | Status: DC

## 2020-05-12 MED ORDER — POTASSIUM CHLORIDE CRYS ER 20 MEQ PO TBCR
40.0000 meq | EXTENDED_RELEASE_TABLET | Freq: Once | ORAL | Status: AC
  Administered 2020-05-12: 40 meq via ORAL

## 2020-05-12 NOTE — Telephone Encounter (Signed)
Wife reports "I forgot to tell you he had a Temp 103.7 last night and 102.6 today. Wife notified of appt today.

## 2020-05-12 NOTE — Patient Instructions (Signed)
Fever, Adult     A fever is an increase in the body's temperature. It is usually defined as a temperature of 100.4F (38C) or higher. Brief mild or moderate fevers generally have no long-term effects, and they often do not need treatment. Moderate or high fevers may make you feel uncomfortable and can sometimes be a sign of a serious illness or disease. The sweating that may occur with repeated or prolonged fever may also cause a loss of fluid in the body (dehydration). Fever is confirmed by taking a temperature with a thermometer. A measured temperature can vary with:  Age.  Time of day.  Where in the body you take the temperature. Readings may vary if you place the thermometer: ? In the mouth (oral). ? In the rectum (rectal). ? In the ear (tympanic). ? Under the arm (axillary). ? On the forehead (temporal). Follow these instructions at home: Medicines  Take over-the counter and prescription medicines only as told by your health care provider. Follow the dosing instructions carefully.  If you were prescribed an antibiotic medicine, take it as told by your health care provider. Do not stop taking the antibiotic even if you start to feel better. General instructions  Watch your condition for any changes. Let your health care provider know about them.  Rest as needed.  Drink enough fluid to keep your urine pale yellow. This helps to prevent dehydration.  Sponge yourself or bathe with room-temperature water to help reduce your body temperature as needed. Do not use ice water.  Do not use too many blankets or wear clothes that are too heavy.  If your fever may be caused by an infection that spreads from person to person (is contagious), such as a cold or the flu, you should stay home from work and public gatherings for at least 24 hours after your fever is gone. Your fever should be gone without the need to use medicines. Contact a health care provider if:  You vomit.  You cannot  eat or drink without vomiting.  You have diarrhea.  You have pain when you urinate.  Your symptoms do not improve with treatment.  You develop new symptoms.  You develop excessive weakness. Get help right away if:  You have shortness of breath or have trouble breathing.  You are dizzy or you faint.  You are disoriented or confused.  You develop signs of dehydration, such as: ? Dark urine, very little urine, or no urine. ? Cracked lips. ? Dry mouth. ? Sunken eyes. ? Sleepiness. ? Weakness.  You develop severe pain in your abdomen.  You have persistent vomiting or diarrhea.  You develop a skin rash.  Your symptoms suddenly get worse. Summary  A fever is an increase in the body's temperature. It is usually defined as a temperature of 100.4F (38C) or higher. Moderate or high fevers can sometimes be a sign of a serious illness or disease. The sweating that may occur with repeated or prolonged fever may also cause dehydration.  Pay attention to any changes in your symptoms and contact your health care provider if your symptoms do not improve with treatment.  Take over-the counter and prescription medicines only as told by your health care provider. Follow the dosing instructions carefully.  If your fever is from an infection that may be contagious, such as cold or flu, you should stay home from work and public gatherings for at least 24 hours after your fever is gone. Your fever should be gone   without the need to use medicines.  Get help right away if you develop signs of dehydration, such as dark urine, cracked lips, dry mouth, sunken eyes, sleepiness, or weakness. This information is not intended to replace advice given to you by your health care provider. Make sure you discuss any questions you have with your health care provider. Document Revised: 05/18/2018 Document Reviewed: 05/18/2018 Elsevier Patient Education  2020 [employer]  

## 2020-05-12 NOTE — Progress Notes (Signed)
[facility] Progress Note   [name] VC:[id number] May 20, 1941 79 y.o.  Tony Marshall is managed by Dr. Julien Nordmann  Actively treated with chemotherapy/immunotherapy/hormonal therapy: yes  Current therapy: Ruxience and Bendeka  Last treated: 05/03/2020 (cycle 2, day 2)  Next scheduled appointment with provider: 05/30/2020  Assessment: Plan:    Fever, unspecified fever cause - Plan: vancomycin (VANCOCIN) 1,000 mg in sodium chloride 0.9 % 250 mL IVPB, levofloxacin (LEVAQUIN) IVPB 500 mg, acetaminophen (TYLENOL) tablet 650 mg, levofloxacin (LEVAQUIN) 500 MG tablet, 0.9 %  sodium chloride infusion  Hypokalemia - Plan: potassium chloride SA (KLOR-CON) CR tablet 40 mEq, potassium chloride SA (KLOR-CON) 20 MEQ tablet  Oral candidiasis - Plan: fluconazole (DIFLUCAN) 200 MG tablet  CLL (chronic lymphocytic leukemia) (HCC)   Fever of unknown etiology: Blood cultures x2 were collected along with a urine and urine culture.  The patient was given vancomycin 1 g IV x1 and Levaquin 500 mg IV x1.  He was given Tylenol 650 mg p.o. x1 and a prescription for Levaquin 500 mg once daily x7 days which she will begin tomorrow.  Hypokalemia: Potassium returned at 2.8 today.  The patient was given potassium chloride 40 mEq p.o. x 1.  Is also given a prescription for potassium chloride 20 mEq twice daily x7 days.  He will have his labs rechecked on his return on 05/30/2020.  Oral candidiasis: The patient was given Diflucan 200 mg p.o. once daily x10 days.  Chronic lymphocytic leukemia: Patient continues to be managed by Dr. Julien Nordmann and is status post cycle 2, day 2 of Ruxience and Bendeka which was dosed on 05/03/2020.  He is scheduled to be seen in follow-up on 05/30/2020.   Please see After Visit Summary for patient specific instructions.  Future Appointments  Date Time Provider [place]  05/16/2020 11:15 AM [facility] 2 [facility] None  05/23/2020 11:30 AM  [facility] 2 [facility] None  05/30/2020 10:00 AM [facility] 2 [facility] None  05/30/2020 10:30 AM Curt Bears, MD [facility] None  05/30/2020 11:30 AM [facility] INFUSION [facility] None  05/31/2020 10:15 AM [facility] INFUSION [facility] None  06/20/2020  1:45 PM [facility] LAB [facility] None  06/23/2020  1:00 PM Elayne Snare, MD [facility] None    No orders of the defined types were placed in this encounter.      Subjective:   Patient ID:  Tony Marshall is a 79 y.o. (DOB 02-May-1941) male.  Chief Complaint:  Chief Complaint  Patient presents with  . Fever    HPI Tony Marshall  is a 79 y.o. male with a diagnosis of chronic lymphocytic leukemia.  He is followed by Dr. Julien Nordmann and is status post cycle 2, day 2 of Ruxience and Bendeka which was dosed on 05/03/2020.  He presents to the clinic today with fatigue, anorexia, dehydration.  He has had a 10 pound weight loss over the last 10 days.  He is also noted having a fever for the past 2 days.  His temperature was 103 yesterday and was 102 today.  A CBC was completed today which returned showing a WBC of 3.8, hemoglobin 9.9, hematocrit 28.7, and platelet count of 240.  The patient's ANC was 1.6 today.  His other labs today show a sodium of 133, potassium 2.8, chloride 94, glucose 272, calcium 8, total protein 4.9, albumin 2.7, and AST of 9.  Medications: I have reviewed the patient's current medications.  Allergies:  Allergies  Allergen Reactions  . Glycopyrrolate Rash  Past Medical History:  Diagnosis Date  . CLL (chronic lymphocytic leukemia) ([place]) dx'd 2014   progression 12/2019  . Diabetes mellitus without complication ([place])   . Diverticulosis   . External hemorrhoids   . GERD (gastroesophageal reflux disease)   . Hiatal hernia   . History of colon polyps   . Hyperlipidemia   . Hypertension   . Internal hemorrhoids   . Lymphocytosis   . Schatzki's ring   . Sleep apnea     Past Surgical  History:  Procedure Laterality Date  . BLEPHAROPLASTY    . COLONOSCOPY  2012  . HEMORRHOID SURGERY     Sclerotherapy  . KNEE SURGERY Right 2002  . POLYPECTOMY    . SHOULDER SURGERY Right 2007    Family History  Problem Relation Age of Onset  . Kidney disease Mother   . CAD Maternal Aunt   . Colon cancer Neg Hx   . Esophageal cancer Neg Hx   . Rectal cancer Neg Hx   . Stomach cancer Neg Hx     Social History   Socioeconomic History  . Marital status: Married    Spouse name: Not on file  . Number of children: 1  . Years of education: Not on file  . Highest education level: Not on file  Occupational History  . Occupation: Retail buyer: [employer]  Tobacco Use  . Smoking status: Never Smoker  . Smokeless tobacco: Former Systems developer    Types: Chew  Substance and Sexual Activity  . Alcohol use: Yes    Alcohol/week: 6.0 standard drinks    Types: 6 Cans of beer per week  . Drug use: No  . Sexual activity: Yes  Other Topics Concern  . Not on file  Social History Narrative  . Not on file   Social Determinants of Health   Financial Resource Strain:   . Difficulty of Paying Living Expenses:   Food Insecurity:   . Worried About Charity fundraiser in the Last Year:   . Arboriculturist in the Last Year:   Transportation Needs:   . Film/video editor (Medical):   Marland Kitchen Lack of Transportation (Non-Medical):   Physical Activity:   . Days of Exercise per Week:   . Minutes of Exercise per Session:   Stress:   . Feeling of Stress :   Social Connections:   . Frequency of Communication with Friends and Family:   . Frequency of Social Gatherings with Friends and Family:   . Attends Religious Services:   . Active Member of Clubs or Organizations:   . Attends Archivist Meetings:   Marland Kitchen Marital Status:   Intimate Partner Violence:   . Fear of Current or Ex-Partner:   . Emotionally Abused:   Marland Kitchen Physically Abused:   . Sexually Abused:      Past Medical History, Surgical history, Social history, and Family history were reviewed and updated as appropriate.   Please see review of systems for further details on the patient's review from today.   Review of Systems:  Review of Systems  Constitutional: Positive for appetite change, fatigue, fever and unexpected weight change. Negative for chills and diaphoresis.  HENT: Negative for trouble swallowing and voice change.   Respiratory: Negative for cough, chest tightness, shortness of breath and wheezing.   Cardiovascular: Negative for chest pain and palpitations.  Gastrointestinal: Negative for abdominal pain, constipation, diarrhea, nausea and vomiting.  Musculoskeletal: Negative for back  pain and myalgias.  Neurological: Positive for weakness. Negative for dizziness, light-headedness and headaches.    Objective:   Physical Exam:  BP (!) 147/79 (BP Location: Right Arm, Patient Position: Sitting)   Pulse 75   Temp 98.6 F (37 C) (Oral)   Resp 18   Ht 5' 7.5" (1.715 m)   Wt 59.8 kg (131 lb 14.4 oz)   SpO2 99%   BMI 20.35 kg/m  ECOG: 1  Physical Exam Constitutional:      General: He is not in acute distress.    Appearance: He is not diaphoretic.  HENT:     Head: Normocephalic and atraumatic.     Right Ear: Tympanic membrane, ear canal and external ear normal.     Left Ear: Tympanic membrane, ear canal and external ear normal.     Mouth/Throat:     Mouth: Mucous membranes are moist.     Pharynx: Oropharynx is clear. No oropharyngeal exudate or posterior oropharyngeal erythema.  Eyes:     General: No scleral icterus.       Right eye: No discharge.        Left eye: No discharge.     Conjunctiva/sclera: Conjunctivae normal.  Cardiovascular:     Rate and Rhythm: Normal rate and regular rhythm.     Heart sounds: Normal heart sounds. No murmur. No friction rub. No gallop.   Pulmonary:     Effort: Pulmonary effort is normal. No respiratory distress.     Breath  sounds: Normal breath sounds. No wheezing or rales.  Abdominal:     General: Bowel sounds are normal. There is no distension.     Tenderness: There is no abdominal tenderness. There is no guarding.  Musculoskeletal:     Right lower leg: No edema.     Left lower leg: No edema.  Skin:    General: Skin is warm and dry.     Findings: No erythema or rash.  Neurological:     Mental Status: He is alert.     Coordination: Coordination normal.     Gait: Gait normal.  Psychiatric:        Mood and Affect: Mood normal.        Behavior: Behavior normal.        Thought Content: Thought content normal.        Judgment: Judgment normal.     Lab Review:     Component Value Date/Time   NA 133 (L) 05/12/2020 1317   NA 141 08/06/2017 1306   K 2.8 (LL) 05/12/2020 1317   K 4.3 08/06/2017 1306   CL 94 (L) 05/12/2020 1317   CO2 25 05/12/2020 1317   CO2 25 08/06/2017 1306   GLUCOSE 272 (H) 05/12/2020 1317   GLUCOSE 150 (H) 08/06/2017 1306   BUN 11 05/12/2020 1317   BUN 22.9 08/06/2017 1306   CREATININE 0.82 05/12/2020 1317   CREATININE 1.3 08/06/2017 1306   CALCIUM 8.0 (L) 05/12/2020 1317   CALCIUM 9.3 08/06/2017 1306   PROT 4.9 (L) 05/12/2020 1317   PROT 6.5 08/06/2017 1306   ALBUMIN 2.7 (L) 05/12/2020 1317   ALBUMIN 3.8 08/06/2017 1306   AST 9 (L) 05/12/2020 1317   AST 20 08/06/2017 1306   ALT 6 05/12/2020 1317   ALT 19 08/06/2017 1306   ALKPHOS 52 05/12/2020 1317   ALKPHOS 47 08/06/2017 1306   BILITOT 0.9 05/12/2020 1317   BILITOT 0.46 08/06/2017 1306   GFRNONAA >60 05/12/2020 1317   GFRAA >60 05/12/2020  1317       Component Value Date/Time   WBC 3.8 (L) 05/12/2020 1317   WBC 100.4 Repeated and verified X2. (HH) 03/23/2020 1213   RBC 3.07 (L) 05/12/2020 1317   HGB 9.9 (L) 05/12/2020 1317   HGB 13.2 11/05/2017 1308   HCT 28.7 (L) 05/12/2020 1317   HCT 40.8 11/05/2017 1308   PLT 240 05/12/2020 1317   PLT 205 11/05/2017 1308   MCV 93.5 05/12/2020 1317   MCV 95.8 11/05/2017  1308   MCH 32.2 05/12/2020 1317   MCHC 34.5 05/12/2020 1317   RDW 15.0 05/12/2020 1317   RDW 14.0 11/05/2017 1308   LYMPHSABS 1.8 05/12/2020 1317   LYMPHSABS 66.1 (H) 11/05/2017 1308   MONOABS 0.3 05/12/2020 1317   MONOABS 1.4 (H) 11/05/2017 1308   EOSABS 0.0 05/12/2020 1317   EOSABS 0.3 11/05/2017 1308   BASOSABS 0.0 05/12/2020 1317   BASOSABS 0.2 (H) 11/05/2017 1308   -------------------------------  Imaging from last 24 hours (if applicable):  Radiology interpretation: No results found.

## 2020-05-12 NOTE — Progress Notes (Signed)
Report given bedside to [employer] for lunch coverage.  VO by PA Lucianne Lei to cancel urine C&A orders, lab made aware.

## 2020-05-12 NOTE — Progress Notes (Signed)
Pt resting comfortably. No change in condition.

## 2020-05-12 NOTE — Telephone Encounter (Signed)
Still not taking in fluids.Received IVF wed. Very weak. Since then , he has averaged 1 coke and 1 water daily. No food intake.

## 2020-05-12 NOTE — Progress Notes (Signed)
Vancomycin (in NS) and Levaquin (in D5W) are compatible Y-site. Informed RN.  Hardie Pulley, PharmD, BCPS, BCOP

## 2020-05-15 DIAGNOSIS — R69 Illness, unspecified: Secondary | ICD-10-CM | POA: Diagnosis not present

## 2020-05-16 ENCOUNTER — Emergency Department (HOSPITAL_COMMUNITY): Payer: Medicare HMO

## 2020-05-16 ENCOUNTER — Telehealth: Payer: Self-pay | Admitting: Emergency Medicine

## 2020-05-16 ENCOUNTER — Encounter (HOSPITAL_COMMUNITY): Payer: Self-pay | Admitting: *Deleted

## 2020-05-16 ENCOUNTER — Emergency Department (HOSPITAL_COMMUNITY)
Admission: EM | Admit: 2020-05-16 | Discharge: 2020-05-16 | Disposition: A | Discharge status: Home / Self Care | Payer: Medicare HMO | Attending: Emergency Medicine | Admitting: Emergency Medicine

## 2020-05-16 ENCOUNTER — Other Ambulatory Visit: Payer: Self-pay | Admitting: Emergency Medicine

## 2020-05-16 ENCOUNTER — Inpatient Hospital Stay: Payer: Medicare HMO

## 2020-05-16 ENCOUNTER — Other Ambulatory Visit: Payer: Self-pay

## 2020-05-16 DIAGNOSIS — R911 Solitary pulmonary nodule: Secondary | ICD-10-CM | POA: Diagnosis not present

## 2020-05-16 DIAGNOSIS — E860 Dehydration: Secondary | ICD-10-CM | POA: Diagnosis not present

## 2020-05-16 DIAGNOSIS — R630 Anorexia: Secondary | ICD-10-CM | POA: Insufficient documentation

## 2020-05-16 DIAGNOSIS — R531 Weakness: Secondary | ICD-10-CM | POA: Diagnosis not present

## 2020-05-16 DIAGNOSIS — R634 Abnormal weight loss: Secondary | ICD-10-CM | POA: Diagnosis not present

## 2020-05-16 DIAGNOSIS — Z7982 Long term (current) use of aspirin: Secondary | ICD-10-CM | POA: Diagnosis not present

## 2020-05-16 DIAGNOSIS — R5383 Other fatigue: Secondary | ICD-10-CM | POA: Diagnosis present

## 2020-05-16 DIAGNOSIS — Z79899 Other long term (current) drug therapy: Secondary | ICD-10-CM | POA: Insufficient documentation

## 2020-05-16 DIAGNOSIS — Z794 Long term (current) use of insulin: Secondary | ICD-10-CM | POA: Diagnosis not present

## 2020-05-16 DIAGNOSIS — C9110 Chronic lymphocytic leukemia of B-cell type not having achieved remission: Secondary | ICD-10-CM

## 2020-05-16 DIAGNOSIS — E119 Type 2 diabetes mellitus without complications: Secondary | ICD-10-CM | POA: Insufficient documentation

## 2020-05-16 DIAGNOSIS — R55 Syncope and collapse: Secondary | ICD-10-CM | POA: Diagnosis not present

## 2020-05-16 DIAGNOSIS — R918 Other nonspecific abnormal finding of lung field: Secondary | ICD-10-CM

## 2020-05-16 DIAGNOSIS — I10 Essential (primary) hypertension: Secondary | ICD-10-CM | POA: Diagnosis not present

## 2020-05-16 DIAGNOSIS — W19XXXA Unspecified fall, initial encounter: Secondary | ICD-10-CM

## 2020-05-16 DIAGNOSIS — R0602 Shortness of breath: Secondary | ICD-10-CM | POA: Diagnosis not present

## 2020-05-16 LAB — URINALYSIS, ROUTINE W REFLEX MICROSCOPIC
Bacteria, UA: NONE SEEN
Bilirubin Urine: NEGATIVE
Glucose, UA: >=500 mg/dL — AB
Hgb urine dipstick: NEGATIVE
Ketones, ur: 20 mg/dL — AB
Leukocytes,Ua: NEGATIVE
Nitrite: NEGATIVE
Protein, ur: NEGATIVE mg/dL
Specific Gravity, Urine: 1.02 (ref 1.005–1.030)
pH: 6 (ref 5.0–8.0)

## 2020-05-16 LAB — COMPREHENSIVE METABOLIC PANEL
ALT: 11 U/L (ref 0–44)
AST: 10 U/L — ABNORMAL LOW (ref 15–41)
Albumin: 3 g/dL — ABNORMAL LOW (ref 3.5–5.0)
Alkaline Phosphatase: 54 U/L (ref 38–126)
Anion gap: 12 (ref 5–15)
BUN: 15 mg/dL (ref 8–23)
CO2: 28 mmol/L (ref 22–32)
Calcium: 8.7 mg/dL — ABNORMAL LOW (ref 8.9–10.3)
Chloride: 97 mmol/L — ABNORMAL LOW (ref 98–111)
Creatinine, Ser: 1.09 mg/dL (ref 0.61–1.24)
GFR calc Af Amer: >60 mL/min (ref >60)
GFR calc non Af Amer: >60 mL/min (ref >60)
Glucose, Bld: 319 mg/dL — ABNORMAL HIGH (ref 70–99)
Potassium: 3.9 mmol/L (ref 3.5–5.1)
Sodium: 137 mmol/L (ref 135–145)
Total Bilirubin: 1.2 mg/dL (ref 0.3–1.2)
Total Protein: 5.1 g/dL — ABNORMAL LOW (ref 6.5–8.1)

## 2020-05-16 LAB — CBC WITH DIFFERENTIAL/PLATELET
Abs Immature Granulocytes: 0.24 10*3/uL — ABNORMAL HIGH (ref 0.00–0.07)
Basophils Absolute: 0 10*3/uL (ref 0.0–0.1)
Basophils Relative: 1 %
Eosinophils Absolute: 0.1 10*3/uL (ref 0.0–0.5)
Eosinophils Relative: 2 %
HCT: 31 % — ABNORMAL LOW (ref 39.0–52.0)
Hemoglobin: 10.2 g/dL — ABNORMAL LOW (ref 13.0–17.0)
Immature Granulocytes: 9 %
Lymphocytes Relative: 37 %
Lymphs Abs: 1 10*3/uL (ref 0.7–4.0)
MCH: 31.8 pg (ref 26.0–34.0)
MCHC: 32.9 g/dL (ref 30.0–36.0)
MCV: 96.6 fL (ref 80.0–100.0)
Monocytes Absolute: 0.4 10*3/uL (ref 0.1–1.0)
Monocytes Relative: 16 %
Neutro Abs: 0.9 10*3/uL — ABNORMAL LOW (ref 1.7–7.7)
Neutrophils Relative %: 35 %
Platelets: 184 10*3/uL (ref 150–400)
RBC: 3.21 MIL/uL — ABNORMAL LOW (ref 4.22–5.81)
RDW: 15.4 % (ref 11.5–15.5)
WBC: 2.7 10*3/uL — ABNORMAL LOW (ref 4.0–10.5)
nRBC: 0 % (ref 0.0–0.2)

## 2020-05-16 LAB — TSH: TSH: 1.609 u[IU]/mL (ref 0.350–4.500)

## 2020-05-16 LAB — MAGNESIUM: Magnesium: 1.3 mg/dL — ABNORMAL LOW (ref 1.7–2.4)

## 2020-05-16 LAB — LACTIC ACID, PLASMA: Lactic Acid, Venous: 1.6 mmol/L (ref 0.5–1.9)

## 2020-05-16 LAB — TROPONIN I (HIGH SENSITIVITY)
Troponin I (High Sensitivity): 9 ng/L (ref <18)
Troponin I (High Sensitivity): 8 ng/L (ref <18)

## 2020-05-16 LAB — AMMONIA: Ammonia: 15 umol/L (ref 9–35)

## 2020-05-16 LAB — LIPASE, BLOOD: Lipase: 22 U/L (ref 11–51)

## 2020-05-16 MED ORDER — SODIUM CHLORIDE (PF) 0.9 % IJ SOLN
INTRAMUSCULAR | Status: AC
  Filled 2020-05-16: qty 50

## 2020-05-16 MED ORDER — SODIUM CHLORIDE 0.9 % IV BOLUS
1000.0000 mL | Freq: Once | INTRAVENOUS | Status: AC
  Administered 2020-05-16: 1000 mL via INTRAVENOUS

## 2020-05-16 MED ORDER — IOHEXOL 350 MG/ML SOLN
100.0000 mL | Freq: Once | INTRAVENOUS | Status: AC | PRN
  Administered 2020-05-16: 100 mL via INTRAVENOUS

## 2020-05-16 MED ORDER — SODIUM CHLORIDE 0.9 % IV BOLUS
500.0000 mL | Freq: Once | INTRAVENOUS | Status: AC
  Administered 2020-05-16: 500 mL via INTRAVENOUS

## 2020-05-16 MED ORDER — MAGNESIUM SULFATE 2 GM/50ML IV SOLN
2.0000 g | Freq: Once | INTRAVENOUS | Status: AC
  Administered 2020-05-16: 2 g via INTRAVENOUS
  Filled 2020-05-16: qty 50

## 2020-05-16 NOTE — ED Provider Notes (Signed)
[facility] DEPT Provider Note   CSN: [id number] Arrival date & time: 05/16/20  1111     History Chief Complaint  Patient presents with  . Failure To Thrive    ca pt    Tony Marshall is a 79 y.o. male.  The history is provided by the patient and medical records. No language interpreter was used.  Illness Location:  Fatigue Quality:  Generalized Severity:  Severe Onset quality:  Gradual Duration:  4 days Timing:  Constant Chronicity:  Recurrent Associated symptoms: fatigue and shortness of breath   Associated symptoms: no abdominal pain, no chest pain, no congestion, no cough, no diarrhea, no ear pain, no fever, no headaches, no loss of consciousness, no nausea, no rash, no rhinorrhea, no vomiting and no wheezing        Past Medical History:  Diagnosis Date  . CLL (chronic lymphocytic leukemia) ([facility]) dx'd 2014   progression 12/2019  . Diabetes mellitus without complication ([place])   . Diverticulosis   . External hemorrhoids   . GERD (gastroesophageal reflux disease)   . Hiatal hernia   . History of colon polyps   . Hyperlipidemia   . Hypertension   . Internal hemorrhoids   . Lymphocytosis   . Schatzki's ring   . Sleep apnea     Patient Active Problem List   Diagnosis Date Noted  . Goals of care, counseling/discussion 03/28/2020  . Encounter for antineoplastic chemotherapy 03/28/2020  . GERD (gastroesophageal reflux disease) 05/13/2014  . History of colonic polyps 05/13/2014  . Hiatal hernia 05/13/2014  . CLL (chronic lymphocytic leukemia) ([place]) 10/25/2013  . ED (erectile dysfunction) 10/20/2013  . Lymphocytosis 09/23/2013  . Essential hypertension 07/21/2013  . Pure hypercholesterolemia 07/21/2013  . Diabetes mellitus ([place]) 07/20/2013    Past Surgical History:  Procedure Laterality Date  . BLEPHAROPLASTY    . COLONOSCOPY  2012  . HEMORRHOID SURGERY     Sclerotherapy  . KNEE SURGERY Right 2002  . POLYPECTOMY    .  SHOULDER SURGERY Right 2007       Family History  Problem Relation Age of Onset  . Kidney disease Mother   . CAD Maternal Aunt   . Colon cancer Neg Hx   . Esophageal cancer Neg Hx   . Rectal cancer Neg Hx   . Stomach cancer Neg Hx     Social History   Tobacco Use  . Smoking status: Never Smoker  . Smokeless tobacco: Former Systems developer    Types: Chew  Substance Use Topics  . Alcohol use: Yes    Alcohol/week: 6.0 standard drinks    Types: 6 Cans of beer per week  . Drug use: No    Home Medications Prior to Admission medications   Medication Sig Start Date End Date Taking? Authorizing Provider  allopurinol (ZYLOPRIM) 100 MG tablet Take 1 tablet (100 mg total) by mouth 2 (two) times daily. 03/28/20   Curt Bears, MD  amLODipine (NORVASC) 5 MG tablet Take 1 tablet (5 mg total) by mouth daily. Patient not taking: Reported on 05/02/2020 03/27/20   Elayne Snare, MD  aspirin 81 MG EC tablet Take 81 mg by mouth daily. Swallow whole.    [provider]  benazepril (LOTENSIN) 20 MG tablet TAKE 1 TABLET BY MOUTH EVERY DAY 03/21/20   Elayne Snare, MD  carvedilol (COREG) 25 MG tablet TAKE ONE TABLET BY MOUTH TWICE DAILY  02/10/20   Elayne Snare, MD  doxazosin (CARDURA) 4 MG tablet Take 1  tablet (4 mg total) by mouth at bedtime. 02/10/20   Elayne Snare, MD  esomeprazole (NEXIUM) 40 MG capsule TAKE 1 CAPSULE BY MOUTH EVERY MORNING 30 MINUTES BEFORE BREAKFAST 04/28/20   Pyrtle, Lajuan Lines, MD  ferrous sulfate 325 (65 FE) MG EC tablet TAKE 1 TABLET BY MOUTH 3 TIMES A DAY WITH MEALS Patient taking differently: Take 325 mg by mouth 2 (two) times daily.  12/28/19   Curt Bears, MD  fluconazole (DIFLUCAN) 200 MG tablet Take 1 tablet (200 mg total) by mouth daily. 05/12/20   Tanner, Lyndon Code., PA-C  glucagon (GLUCAGON EMERGENCY) 1 MG injection Inject 1 mg into the skin once as needed (For severe low blood sugar). 06/14/19   Elayne Snare, MD  ibuprofen (ADVIL) 200 MG tablet Take 200 mg by mouth every 6 (six)  hours as needed for headache or mild pain.    [provider]  insulin aspart (NOVOLOG FLEXPEN) 100 UNIT/ML FlexPen Inject 3-4 Units into the skin 3 (three) times daily with meals. Inject 3-4 units under the skin three times daily before meals.    [provider]  insulin degludec (TRESIBA FLEXTOUCH) 100 UNIT/ML SOPN FlexTouch Pen inject 12 units daily. if fasting blood sugars are over 140 increase dosage up to 15 units daily as directed. Patient taking differently: 14 Units. inject 14 units daily. if fasting blood sugars are over 140 increase dosage up to 15 units daily as directed. 03/22/19   Elayne Snare, MD  levofloxacin (LEVAQUIN) 500 MG tablet Take 1 tablet (500 mg total) by mouth daily. Begin on Saturday, May 29. 05/12/20   Tanner, Lyndon Code., PA-C  metFORMIN (GLUCOPHAGE-XR) 500 MG 24 hr tablet Take 1 tablet (500 mg total) by mouth daily with supper. 09/16/19   Elayne Snare, MD  methylPREDNISolone (MEDROL DOSEPAK) 4 MG TBPK tablet Use as instructed. 03/28/20   Curt Bears, MD  [facility] VERIO test strip USE 1 STRIP TO CHECK GLUCOSE THREE TIMES DAILY 02/24/20   Elayne Snare, MD  pioglitazone (ACTOS) 15 MG tablet TAKE 1 TABLET BY MOUTH EVERY DAY 03/21/20   Elayne Snare, MD  potassium chloride SA (KLOR-CON) 20 MEQ tablet Take 1 tablet (20 mEq total) by mouth 2 (two) times daily. 05/12/20   Tanner, Lyndon Code., PA-C  prochlorperazine (COMPAZINE) 10 MG tablet Take 1 tablet (10 mg total) by mouth every 6 (six) hours as needed for nausea or vomiting. 03/28/20   Curt Bears, MD  rosuvastatin (CRESTOR) 5 MG tablet TAKE 1 TABLET BY MOUTH EVERY DAY 04/21/20   Elayne Snare, MD  saccharomyces boulardii (FLORASTOR) 250 MG capsule Take 250 mg by mouth 2 (two) times daily.    [provider]  sildenafil (REVATIO) 20 MG tablet TAKE 5 TABLETS BY MOUTH AS NEEDED once a week. 04/16/16   Elayne Snare, MD    Allergies    Glycopyrrolate  Review of Systems   Review of Systems  Constitutional: Positive for  fatigue. Negative for chills, diaphoresis and fever.  HENT: Negative for congestion, ear pain and rhinorrhea.   Eyes: Negative for photophobia and visual disturbance.  Respiratory: Positive for shortness of breath. Negative for cough, choking, chest tightness, wheezing and stridor.   Cardiovascular: Negative for chest pain, palpitations and leg swelling.  Gastrointestinal: Negative for abdominal pain, constipation, diarrhea, nausea and vomiting.  Genitourinary: Negative for decreased urine volume, discharge, dysuria, flank pain, frequency, penile pain, penile swelling, scrotal swelling and testicular pain.  Musculoskeletal: Negative for back pain, neck pain and neck stiffness.  Skin:  Negative for rash and wound.  Neurological: Positive for light-headedness. Negative for dizziness, seizures, loss of consciousness, syncope, speech difficulty, weakness and headaches.  Psychiatric/Behavioral: Negative for agitation and confusion.  All other systems reviewed and are negative.   Physical Exam Updated Vital Signs BP (!) 142/89 (BP Location: Right Arm)   Pulse 90   Temp 97.9 F (36.6 C)   Resp 16   Wt 57.2 kg   SpO2 98%   BMI 19.44 kg/m   Physical Exam Vitals and nursing note reviewed.  Constitutional:      General: He is not in acute distress.    Appearance: He is underweight. He is not ill-appearing, toxic-appearing or diaphoretic.  HENT:     Head: Normocephalic and atraumatic.     Nose: Nose normal. No congestion or rhinorrhea.     Mouth/Throat:     Mouth: Mucous membranes are dry.     Pharynx: No oropharyngeal exudate or posterior oropharyngeal erythema.  Eyes:     Extraocular Movements: Extraocular movements intact.     Conjunctiva/sclera: Conjunctivae normal.     Pupils: Pupils are equal, round, and reactive to light.  Cardiovascular:     Rate and Rhythm: Normal rate and regular rhythm.     Pulses: Normal pulses.     Heart sounds: No murmur.  Pulmonary:     Effort:  Pulmonary effort is normal. No respiratory distress.     Breath sounds: Normal breath sounds. No wheezing, rhonchi or rales.  Chest:     Chest wall: No tenderness.  Abdominal:     General: Abdomen is flat.     Palpations: Abdomen is soft.     Tenderness: There is no abdominal tenderness. There is no right CVA tenderness, left CVA tenderness, guarding or rebound.  Musculoskeletal:        General: No tenderness.     Cervical back: Neck supple. No rigidity or tenderness.     Right lower leg: No edema.     Left lower leg: No edema.  Skin:    General: Skin is warm and dry.     Capillary Refill: Capillary refill takes less than 2 seconds.     Findings: No erythema or rash.  Neurological:     General: No focal deficit present.     Mental Status: He is alert and oriented to person, place, and time.     Sensory: No sensory deficit.     Motor: No weakness.  Psychiatric:        Mood and Affect: Mood normal.     ED Results / Procedures / Treatments   Labs (all labs ordered are listed, but only abnormal results are displayed) Labs Reviewed  CBC WITH DIFFERENTIAL/PLATELET - Abnormal; Notable for the following components:      Result Value   WBC 2.7 (*)    RBC 3.21 (*)    Hemoglobin 10.2 (*)    HCT 31.0 (*)    Neutro Abs 0.9 (*)    Abs Immature Granulocytes 0.24 (*)    All other components within normal limits  COMPREHENSIVE METABOLIC PANEL - Abnormal; Notable for the following components:   Chloride 97 (*)    Glucose, Bld 319 (*)    Calcium 8.7 (*)    Total Protein 5.1 (*)    Albumin 3.0 (*)    AST 10 (*)    All other components within normal limits  MAGNESIUM - Abnormal; Notable for the following components:   Magnesium 1.3 (*)  All other components within normal limits  URINALYSIS, ROUTINE W REFLEX MICROSCOPIC - Abnormal; Notable for the following components:   Glucose, UA >=500 (*)    Ketones, ur 20 (*)    All other components within normal limits  URINE CULTURE    CULTURE, BLOOD (ROUTINE X 2)  CULTURE, BLOOD (ROUTINE X 2)  LACTIC ACID, PLASMA  LIPASE, BLOOD  TSH  AMMONIA  TROPONIN I (HIGH SENSITIVITY)  TROPONIN I (HIGH SENSITIVITY)    EKG EKG Interpretation  Date/Time:  Tuesday May 16 2020 12:20:42 EDT Ventricular Rate:  81 PR Interval:    QRS Duration: 86 QT Interval:  419 QTC Calculation: 487 R Axis:   29 Text Interpretation: Sinus rhythm Atrial premature complex Borderline low voltage, extremity leads Borderline prolonged QT interval When compared to prior, more PAC. No STEMI Confirmed by Antony Blackbird [phone]) on 05/16/2020 12:42:16 PM   Radiology DG Chest 2 View  Result Date: 05/16/2020 CLINICAL DATA:  Shortness of breath. EXAM: CHEST - 2 VIEW COMPARISON:  November 29, 2007. FINDINGS: The heart size and mediastinal contours are within normal limits. Both lungs are clear. No pneumothorax or pleural effusion is noted. The visualized skeletal structures are unremarkable. IMPRESSION: No active cardiopulmonary disease. Aortic Atherosclerosis (ICD10-I70.0). Electronically Signed   By: Marijo Conception M.D.   On: 05/16/2020 12:48    Procedures Procedures (including critical care time)  Medications Ordered in ED Medications  magnesium sulfate IVPB 2 g 50 mL (2 g Intravenous New Bag/Given 05/16/20 1636)  sodium chloride 0.9 % bolus 1,000 mL (0 mLs Intravenous Stopped 05/16/20 1330)  sodium chloride 0.9 % bolus 500 mL (500 mLs Intravenous New Bag/Given 05/16/20 1635)    ED Course  I have reviewed the triage vital signs and the nursing notes.  Pertinent labs & imaging results that were available during my care of the patient were reviewed by me and considered in my medical decision making (see chart for details).    MDM Rules/Calculators/A&P                      MALAKIE CRUPI is a 79 y.o. male with a past medical history significant for CLL currently on chemotherapy, hypertension, hyperlipidemia, hypercholesterolemia, diabetes, GERD, and  diverticulosis who presents with exertional shortness of breath, fatigue, weight loss, falls, and concern for failure to thrive.  According to patient, he was started on antibiotics several days ago for fevers.  He reports he does not think he is doing any better and reports she is overall worsening.  He reports he is not eating and drinking and getting very fatigued.  He reports he has had 3 episodes of near syncopal falls over the weekend but denied hitting his head.  He reports he got very lightheaded and got himself to the ground without hitting his head.  He reported no associated chest pain, palpitations, shortness of breath.  He denies recent cough, congestion, nausea, vomiting, constipation, diarrhea, or urinary symptoms.  He is just feeling generalized fatigue and malaise and feels like he is getting dehydrated.  He is also feeling the lightheaded episodes.  His family was concerned about failure to thrive and the 1 progressive weight loss.  He thinks he is lost another 10 pounds this week.  He was scheduled to have blood work done with his oncologist today but due to the falls and worsening fatigue and occasional shortness of breath, he was told to come to the emergency department.  On exam, lungs  are clear and chest is nontender.  No murmur.  Good pulses in all extremities.  Normal sensation and strength in extremities.  Abdomen nontender with normal bowel sounds.  No focal neurologic deficits.  Patient resting comfortably on room air during my evaluation.  Given the patient's reported decreased oral intake, weight loss, dry mouth on exam, and concern for dehydration, will give some fluids.  We will get screening labs to look for electrolyte imbalance AKI or occult infection.  He is currently taking Levaquin for fever of unknown origin given his chemotherapy.  With lack of headache, neck pain, neck tenderness, low suspicion for meningitis at this time.  Overall, family reportedly was concerned due to  the worsening fatigue, decreased intake, and these new falls.  Will reassess patient after work-up to look for lab imaging or urine abnormalities.  Given lack of head trauma, will hold on CT of the head at this time.  Anticipate reassessment for work-up.  We will give some fluids to start with.  4:30 PM Work-up is begun to return and confirming some dehydration.  Ketones in urine but no evidence of infection.  He has some low magnesium and decreased chloride otherwise labs reassuring.  TSH normal.  Ammonia normal.  Anemia similar and improved to prior.  Metabolic panel shows no acute kidney injury.  Initial troponin is negative, will trend.  Lipase not elevated.  Chest x-ray shows no pneumonia.  I suspect the patient's dehydration, nursing of episodes, and fatigue are related to his cancer treatments however his work-up here thus far is only significant for dehydration.  Will give some more fluids and replace the magnesium through IV.  After, we will check orthostatics to see if he gets symptomatic and feels like he is going to pass out.  If patient tries to syncopized or is unable to stand up safely, patient may require admission for further rehydration however if he is able to stand without lightheadedness or near syncope, he would likely stable for discharge home for outpatient follow-up and further management.  Care transferred to Dr. Eulis Foster while awaiting for reassessment after fluids and magnesium.  If he gets symptomatic, anticipate observation admission for further rehydration and monitoring.  Final Clinical Impression(s) / ED Diagnoses Final diagnoses:  Dehydration  Fall, initial encounter  Near syncope  Decreased appetite    Clinical Impression: 1. Dehydration   2. Fall, initial encounter   3. Near syncope   4. Decreased appetite     Disposition: Care transferred to Dr. Eulis Foster while awaiting for reassessment after fluids and magnesium.  If he gets symptomatic, anticipate observation  admission for further rehydration and monitoring.  This note was prepared with assistance of Systems analyst. Occasional wrong-word or sound-a-like substitutions may have occurred due to the inherent limitations of voice recognition software.       Aritza Brunet, Gwenyth Allegra, MD 05/16/20 1714

## 2020-05-16 NOTE — Progress Notes (Signed)
These preliminary result these preliminary results were noted.  Awaiting final report.

## 2020-05-16 NOTE — ED Notes (Signed)
Pt ambulated in the hall. Pt was steady and required no assistance getting out of bed or ambulating.

## 2020-05-16 NOTE — ED Provider Notes (Signed)
1905 p.m.-Patient presenting for evaluation of fatigue and shortness of breath.  He has also been very weak and having trouble walking.  He fell several times over the weekend.  He has CLL.  Last seen by oncology, 4 days ago, to assess fever, in the face of treatment with chemotherapy agents, on 05/04/2020.  He was started on fluconazole for oral candidiasis.  Blood cultures were obtained, and are currently negative.  At that time he was also treated for hypokalemia with potassium.  CT angiogram chest ordered to evaluate for pulmonary embolus, read by radiologist as no PE.  There were pulmonary nodules, bilateral, of nonspecific nature.  These require observational reassessment, initially at 3-6 months.  The patient has been able ambulate easily without assistance.  Repeat vital signs are reassuring.  Patient reevaluated at 8:30 PM.  At this time, he is alert and comfortable. He states his shortness of breath, described earlier is because of lethargy and general weakness. He describes difficulty swallowing so he does not eat much. His wife asked me about having a feeding tube placed. He is currently in day 4 of 7 treatment for oral candidiasis. I informed him of the CT abnormality showing bilateral pulmonary nodules and the necessity for follow-up in 3 months. We also discussed following up with GI to evaluate for possible esophageal candidiasis, and additional treatment which might be necessary to improve his oral intake. Patient understands he is stable for discharge and will follow up as recommended. Wife was present for the discussion.   Daleen Bo, MD 05/16/20 2042

## 2020-05-16 NOTE — ED Triage Notes (Signed)
Pt states he is not eating, feels weak and dehydrated. Pt states he is losing weigh, which has been ongoing for months and is getting worse. No fevers, chills.

## 2020-05-16 NOTE — Telephone Encounter (Signed)
Returning VM to MD [facility] office requesting advisement from pt's wife Enid Derry.  Per Enid Derry pt is extremely weak and has fallen three times over the weekend.  Pt has had intermittent fever & chills with most recent temperature of 97.4 orally.  Pt was not able to get off floor after last fall per wife and is extremely lethargic.  Alvina Chou to take pt to [facility] for evaluation (or call EMS if transport is needed).  Enid Derry agreed to to go ED, asked to cancel lab appt scheduled for today.  Appt cancelled, MD Mohamed's office made aware of ED visit.  Enid Derry denies any further questions/concerns at this time.

## 2020-05-16 NOTE — Discharge Instructions (Signed)
The testing today did not show any serious problems. It is very important that you continue to drink plenty of fluids, and try to improve your nutritional intake of food. Continue to try different things, which you might tolerate, as you work on this. You will need to see your GI doctor if the current treatment for thrush does not improve your symptoms. Call him for an appointment as soon as possible. See your primary care doctor, and oncologist, as needed and as scheduled. Make sure that your primary care doctor orders a repeat CT scan of the chest, in 3 months.

## 2020-05-17 LAB — CULTURE, BLOOD (SINGLE)
Culture: NO GROWTH
Culture: NO GROWTH

## 2020-05-17 LAB — URINE CULTURE: Culture: NO GROWTH

## 2020-05-17 NOTE — Progress Notes (Signed)
These preliminary result these preliminary results were noted.  Awaiting final report.

## 2020-05-19 ENCOUNTER — Telehealth: Payer: Self-pay | Admitting: Medical

## 2020-05-19 NOTE — Telephone Encounter (Signed)
The patient is wife called asking information about management of constipation.  A call was returned to her cell phone however no one answered so a message was left instructing the patient that he could take one half bottle of magnesium citrate then take the remainder of the bottle if he had no bowel movement within 30 to 60 minutes.  Once he has had a bowel movement he has been instructed to begin senna-S1-2 tablets twice daily.  Additionally he can use MiraLAX 1-2 times daily.  He was told to increase his fluid intake also.  Sandi Mealy, MHS, PA-C Physician Assistant

## 2020-05-20 ENCOUNTER — Other Ambulatory Visit: Payer: Self-pay | Admitting: Endocrinology

## 2020-05-21 LAB — CULTURE, BLOOD (ROUTINE X 2)
Culture: NO GROWTH
Culture: NO GROWTH

## 2020-05-22 ENCOUNTER — Telehealth: Payer: Self-pay | Admitting: *Deleted

## 2020-05-23 ENCOUNTER — Other Ambulatory Visit: Payer: Self-pay

## 2020-05-23 ENCOUNTER — Inpatient Hospital Stay: Payer: Medicare HMO | Attending: Internal Medicine

## 2020-05-23 DIAGNOSIS — E1110 Type 2 diabetes mellitus with ketoacidosis without coma: Secondary | ICD-10-CM | POA: Insufficient documentation

## 2020-05-23 DIAGNOSIS — C9110 Chronic lymphocytic leukemia of B-cell type not having achieved remission: Secondary | ICD-10-CM | POA: Diagnosis present

## 2020-05-23 DIAGNOSIS — E860 Dehydration: Secondary | ICD-10-CM | POA: Insufficient documentation

## 2020-05-23 DIAGNOSIS — Z5112 Encounter for antineoplastic immunotherapy: Secondary | ICD-10-CM | POA: Insufficient documentation

## 2020-05-23 DIAGNOSIS — Z794 Long term (current) use of insulin: Secondary | ICD-10-CM | POA: Diagnosis not present

## 2020-05-23 LAB — CBC WITH DIFFERENTIAL (CANCER CENTER ONLY)
Abs Immature Granulocytes: 0.25 10*3/uL — ABNORMAL HIGH (ref 0.00–0.07)
Basophils Absolute: 0.1 10*3/uL (ref 0.0–0.1)
Basophils Relative: 1 %
Eosinophils Absolute: 0.1 10*3/uL (ref 0.0–0.5)
Eosinophils Relative: 2 %
HCT: 31.1 % — ABNORMAL LOW (ref 39.0–52.0)
Hemoglobin: 10.4 g/dL — ABNORMAL LOW (ref 13.0–17.0)
Immature Granulocytes: 4 %
Lymphocytes Relative: 51 %
Lymphs Abs: 3.5 10*3/uL (ref 0.7–4.0)
MCH: 31.6 pg (ref 26.0–34.0)
MCHC: 33.4 g/dL (ref 30.0–36.0)
MCV: 94.5 fL (ref 80.0–100.0)
Monocytes Absolute: 0.3 10*3/uL (ref 0.1–1.0)
Monocytes Relative: 5 %
Neutro Abs: 2.5 10*3/uL (ref 1.7–7.7)
Neutrophils Relative %: 37 %
Platelet Count: 78 10*3/uL — ABNORMAL LOW (ref 150–400)
RBC: 3.29 MIL/uL — ABNORMAL LOW (ref 4.22–5.81)
RDW: 15.9 % — ABNORMAL HIGH (ref 11.5–15.5)
WBC Count: 6.7 10*3/uL (ref 4.0–10.5)
nRBC: 0 % (ref 0.0–0.2)

## 2020-05-23 LAB — CMP (CANCER CENTER ONLY)
ALT: 8 U/L (ref 0–44)
AST: 12 U/L — ABNORMAL LOW (ref 15–41)
Albumin: 2.9 g/dL — ABNORMAL LOW (ref 3.5–5.0)
Alkaline Phosphatase: 64 U/L (ref 38–126)
Anion gap: 12 (ref 5–15)
BUN: 21 mg/dL (ref 8–23)
CO2: 26 mmol/L (ref 22–32)
Calcium: 8.9 mg/dL (ref 8.9–10.3)
Chloride: 95 mmol/L — ABNORMAL LOW (ref 98–111)
Creatinine: 1.11 mg/dL (ref 0.61–1.24)
GFR, Est AFR Am: >60 mL/min (ref >60)
GFR, Estimated: >60 mL/min (ref >60)
Glucose, Bld: 319 mg/dL — ABNORMAL HIGH (ref 70–99)
Potassium: 4.6 mmol/L (ref 3.5–5.1)
Sodium: 133 mmol/L — ABNORMAL LOW (ref 135–145)
Total Bilirubin: 0.8 mg/dL (ref 0.3–1.2)
Total Protein: 5 g/dL — ABNORMAL LOW (ref 6.5–8.1)

## 2020-05-23 LAB — LACTATE DEHYDROGENASE: LDH: 274 U/L — ABNORMAL HIGH (ref 98–192)

## 2020-05-23 LAB — URIC ACID: Uric Acid, Serum: 4.6 mg/dL (ref 3.7–8.6)

## 2020-05-26 ENCOUNTER — Telehealth: Payer: Self-pay | Admitting: Nutrition

## 2020-05-26 NOTE — Telephone Encounter (Signed)
I was asked to contact patient's wife prior to his nutrition appointment.  Patient's wife was not available so I left a message with my name and phone number for return call.  Patient is scheduled for infusion on Tuesday, June 15.

## 2020-05-29 ENCOUNTER — Encounter: Payer: Self-pay | Admitting: Internal Medicine

## 2020-05-29 ENCOUNTER — Telehealth: Payer: Self-pay | Admitting: Nutrition

## 2020-05-29 ENCOUNTER — Encounter: Payer: Medicare HMO | Admitting: Nutrition

## 2020-05-29 ENCOUNTER — Other Ambulatory Visit: Payer: Self-pay | Admitting: Physician Assistant

## 2020-05-29 DIAGNOSIS — C9110 Chronic lymphocytic leukemia of B-cell type not having achieved remission: Secondary | ICD-10-CM

## 2020-05-29 NOTE — Progress Notes (Signed)
Pt's wife returned my call about applying for copay assistance.  She gave me consent to apply in pt's behalf so I completed the online application w/ [employer]LS.  The application status is currently pending review so I will notify them of the outcome once I receive it.  I also informed her of the [employer], went over what it covers and gave her the income requirement.  Pt would like to apply so they will bring proof of income on 05/30/20.  Once approved I will give him an expense sheet and my card for any questions or concerns he may have in the future.

## 2020-05-29 NOTE — Telephone Encounter (Signed)
Patient's wife contacted me to discuss patient's nutrition prior to nutrition appointment scheduled on Tuesday, 21 May 2014.  Patient is a 79 year old male diagnosed with CLL and is followed by Dr. Julien Nordmann.  Past medical history includes diabetes, diverticulosis, GERD, hiatal hernia, hyperlipidemia, and hypertension.  Medications include Nexium, iron, Glucophage, NovoLog, glucagon, Actos, Compazine, and Crestor.  Labs include glucose 319 and albumin 3.0 on June 1.  Height: 67.5 inches. Weight: 126 pounds on June 1. Usual body weight: 153 pounds in April 2021. BMI: 19.44.  Patient's wife very concerned with husband's weight loss.  She reports he probably only weighs 120 pounds. She denies that he has difficulty chewing and swallowing. He does refuse food secondary to taste changes and poor appetite. She reports patient ate half of a ham biscuit and a half a bottle of Ensure yesterday. Patient has constipation and she is beginning MiraLAX. She is questioning appetite stimulant.  Nutrition diagnosis: Unintended weight loss related to CLL and associated treatments as evidenced by 18% weight loss over 2 months which is significant.  Intervention: Patient should increase oral intake and small frequent meals and snacks focusing on high-calorie foods with adequate protein. Provided suggestions for patient to try no added sugar Carnation breakfast essentials. Recommended 4 ounces of nutrition supplement every hour or so as tolerated. Discuss appetite stimulant with MD at tomorrow's visit. Will provide patient with fact sheets and oral nutrition supplements samples tomorrow during infusion.  Monitoring, evaluation, goals: Patient will tolerate increased calories and protein to minimize further weight loss.  Next visit to be scheduled as needed.

## 2020-05-30 ENCOUNTER — Inpatient Hospital Stay: Payer: Medicare HMO | Admitting: Nutrition

## 2020-05-30 ENCOUNTER — Inpatient Hospital Stay: Payer: Medicare HMO | Admitting: Physician Assistant

## 2020-05-30 ENCOUNTER — Inpatient Hospital Stay: Payer: Medicare HMO

## 2020-05-30 ENCOUNTER — Telehealth: Payer: Self-pay

## 2020-05-30 ENCOUNTER — Other Ambulatory Visit: Payer: Self-pay

## 2020-05-30 ENCOUNTER — Encounter: Payer: Self-pay | Admitting: Internal Medicine

## 2020-05-30 ENCOUNTER — Encounter: Payer: Self-pay | Admitting: Physician Assistant

## 2020-05-30 ENCOUNTER — Telehealth: Payer: Self-pay | Admitting: Endocrinology

## 2020-05-30 VITALS — BP 83/47 | HR 64 | Temp 97.8°F | Resp 18 | Ht 67.0 in | Wt 124.4 lb

## 2020-05-30 VITALS — BP 108/64 | HR 70 | Temp 97.8°F | Resp 18

## 2020-05-30 DIAGNOSIS — R739 Hyperglycemia, unspecified: Secondary | ICD-10-CM | POA: Diagnosis not present

## 2020-05-30 DIAGNOSIS — R634 Abnormal weight loss: Secondary | ICD-10-CM

## 2020-05-30 DIAGNOSIS — E860 Dehydration: Secondary | ICD-10-CM

## 2020-05-30 DIAGNOSIS — C9110 Chronic lymphocytic leukemia of B-cell type not having achieved remission: Secondary | ICD-10-CM

## 2020-05-30 DIAGNOSIS — Z5111 Encounter for antineoplastic chemotherapy: Secondary | ICD-10-CM

## 2020-05-30 DIAGNOSIS — Z5112 Encounter for antineoplastic immunotherapy: Secondary | ICD-10-CM | POA: Diagnosis not present

## 2020-05-30 LAB — CBC WITH DIFFERENTIAL (CANCER CENTER ONLY)
Abs Immature Granulocytes: 0.02 10*3/uL (ref 0.00–0.07)
Basophils Absolute: 0 10*3/uL (ref 0.0–0.1)
Basophils Relative: 1 %
Eosinophils Absolute: 0.5 10*3/uL (ref 0.0–0.5)
Eosinophils Relative: 10 %
HCT: 29.3 % — ABNORMAL LOW (ref 39.0–52.0)
Hemoglobin: 9.7 g/dL — ABNORMAL LOW (ref 13.0–17.0)
Immature Granulocytes: 0 %
Lymphocytes Relative: 70 %
Lymphs Abs: 3.3 10*3/uL (ref 0.7–4.0)
MCH: 31.9 pg (ref 26.0–34.0)
MCHC: 33.1 g/dL (ref 30.0–36.0)
MCV: 96.4 fL (ref 80.0–100.0)
Monocytes Absolute: 0.3 10*3/uL (ref 0.1–1.0)
Monocytes Relative: 6 %
Neutro Abs: 0.6 10*3/uL — ABNORMAL LOW (ref 1.7–7.7)
Neutrophils Relative %: 13 %
Platelet Count: 124 10*3/uL — ABNORMAL LOW (ref 150–400)
RBC: 3.04 MIL/uL — ABNORMAL LOW (ref 4.22–5.81)
RDW: 16.3 % — ABNORMAL HIGH (ref 11.5–15.5)
WBC Count: 4.6 10*3/uL (ref 4.0–10.5)
nRBC: 0 % (ref 0.0–0.2)

## 2020-05-30 LAB — CMP (CANCER CENTER ONLY)
ALT: 7 U/L (ref 0–44)
AST: 8 U/L — ABNORMAL LOW (ref 15–41)
Albumin: 2.8 g/dL — ABNORMAL LOW (ref 3.5–5.0)
Alkaline Phosphatase: 55 U/L (ref 38–126)
Anion gap: 7 (ref 5–15)
BUN: 21 mg/dL (ref 8–23)
CO2: 28 mmol/L (ref 22–32)
Calcium: 8.8 mg/dL — ABNORMAL LOW (ref 8.9–10.3)
Chloride: 100 mmol/L (ref 98–111)
Creatinine: 0.91 mg/dL (ref 0.61–1.24)
GFR, Est AFR Am: >60 mL/min (ref >60)
GFR, Estimated: >60 mL/min (ref >60)
Glucose, Bld: 293 mg/dL — ABNORMAL HIGH (ref 70–99)
Potassium: 3.7 mmol/L (ref 3.5–5.1)
Sodium: 135 mmol/L (ref 135–145)
Total Bilirubin: 0.6 mg/dL (ref 0.3–1.2)
Total Protein: 4.7 g/dL — ABNORMAL LOW (ref 6.5–8.1)

## 2020-05-30 LAB — LACTATE DEHYDROGENASE: LDH: 224 U/L — ABNORMAL HIGH (ref 98–192)

## 2020-05-30 MED ORDER — INSULIN REGULAR HUMAN 100 UNIT/ML IJ SOLN
8.0000 [IU] | Freq: Once | INTRAMUSCULAR | Status: AC
  Administered 2020-05-30: 8 [IU] via SUBCUTANEOUS

## 2020-05-30 MED ORDER — SODIUM CHLORIDE 0.9 % IV SOLN
10.0000 mg | Freq: Once | INTRAVENOUS | Status: AC
  Administered 2020-05-30: 10 mg via INTRAVENOUS
  Filled 2020-05-30: qty 10

## 2020-05-30 MED ORDER — SODIUM CHLORIDE 0.9 % IV SOLN
Freq: Once | INTRAVENOUS | Status: AC
  Filled 2020-05-30: qty 250

## 2020-05-30 MED ORDER — DRONABINOL 2.5 MG PO CAPS
2.5000 mg | ORAL_CAPSULE | Freq: Two times a day (BID) | ORAL | 0 refills | Status: DC
Start: 2020-05-30 — End: 2020-07-15

## 2020-05-30 MED ORDER — SODIUM CHLORIDE 0.9 % IV SOLN
70.0000 mg/m2 | Freq: Once | INTRAVENOUS | Status: AC
  Administered 2020-05-30: 125 mg via INTRAVENOUS
  Filled 2020-05-30: qty 5

## 2020-05-30 MED ORDER — DIPHENHYDRAMINE HCL 25 MG PO CAPS
50.0000 mg | ORAL_CAPSULE | Freq: Once | ORAL | Status: AC
  Administered 2020-05-30: 50 mg via ORAL

## 2020-05-30 MED ORDER — PALONOSETRON HCL INJECTION 0.25 MG/5ML
0.2500 mg | Freq: Once | INTRAVENOUS | Status: AC
  Administered 2020-05-30: 0.25 mg via INTRAVENOUS

## 2020-05-30 MED ORDER — PALONOSETRON HCL INJECTION 0.25 MG/5ML
INTRAVENOUS | Status: AC
  Filled 2020-05-30: qty 5

## 2020-05-30 MED ORDER — DIPHENHYDRAMINE HCL 25 MG PO CAPS
ORAL_CAPSULE | ORAL | Status: AC
  Filled 2020-05-30: qty 2

## 2020-05-30 MED ORDER — ACETAMINOPHEN 325 MG PO TABS
ORAL_TABLET | ORAL | Status: AC
  Filled 2020-05-30: qty 2

## 2020-05-30 MED ORDER — ACETAMINOPHEN 325 MG PO TABS
650.0000 mg | ORAL_TABLET | Freq: Once | ORAL | Status: AC
  Administered 2020-05-30: 650 mg via ORAL

## 2020-05-30 MED ORDER — SODIUM CHLORIDE 0.9 % IV SOLN
375.0000 mg/m2 | Freq: Once | INTRAVENOUS | Status: AC
  Administered 2020-05-30: 600 mg via INTRAVENOUS
  Filled 2020-05-30: qty 50

## 2020-05-30 MED FILL — DRONABINOL 2.5 MG CAPSULE: 2.5 | 15 days supply | Qty: 30 | Fill #0

## 2020-05-30 NOTE — Telephone Encounter (Signed)
Per Olen Cordial via Dr. Ronnie Derby office MD reviewed meds and will d/c Norvasc and Cardura. PT will be made aware

## 2020-05-30 NOTE — Progress Notes (Signed)
Confirmed w/ Dr. Julien Nordmann that [place] dose will be reduced for the current cycle. Both days pt will receive 70 mg/m2 since ANC is low.  Kennith Center, Pharm.D., CPP 05/30/2020@12 :16 PM

## 2020-05-30 NOTE — Progress Notes (Signed)
[facility] OFFICE PROGRESS NOTE  Elayne Snare, MD [address] [place] [address]  DIAGNOSIS: Chronic lymphocytic leukemia diagnosed in October 2014.  PRIOR THERAPY: None  CURRENT THERAPY: Systemic chemotherapy with bendamustine 90 mg/M2 on days 1 and 2 and Rituxan 375 mg/M2 on day 1 every 4 weeks.  First dose 04/04/2020.  Status post 2 cycles. Dose reduced to bendamustine 70 mg/m2 for cycle #3 due to neutropenia and Rituxan reduced due to weight loss.   INTERVAL HISTORY: Tony Marshall 79 y.o. male returns to the clinic for a follow up visit accompanied by his wife. The patient has been experiencing fatigue, generalized weakness, weight loss, lightheadedness with standing, and poor appetite recently. He was seen in the ER for dehydration on 6/1. The patient continues to lose weight and lost 17 lbs since last month. He is scheduled to be re-evaluated by a member of the nutritionist team while in infusion today. They also inquired about medications to stimulate his appetite, which is reasonable. He is not a good candidate for steroids due to his diabetes mellitus and hyperglycemia. He states he checks his blood sugar daily and is aware of signs and symptoms of hypo/hyperglycemia.   The patient reports he drinks two 8 ounce bottles of water daily.  He takes 3 medications for blood pressure including Benzapril, Norvasc, and Coreg.  He took all 3 of his medications today.  He is hypotensive in the clinic today.  He also reports that he frequently experiences lightheadedness upon standing likely secondary to orthostatic hypotension.  He also has sildenafil in his medication list but he confirmed that he is/had not taking that medication recently.  He denies any current signs and symptoms of infection including fever, chills, sore throat, cough, shortness of breath, diarrhea, skin infections, or dysuria. He was on levaquin last month for fevers which has resolved at this  time.   He does report his baseline night sweats which have been occurring for several years.  He occasionally experiences mild nausea which is controlled with his antiemetic. The patient currently reports constipation.  He is unsure when his last bowel movement was but thinks it was about 1 week ago.  He called the clinic recently for recommendations regarding constipation management, but has not done that at this point in time.  He is planning on using magnesium citrate upon returning home today and then to begin to use a stool softener regularly. He denies any abdominal pain today or vomiting. He denies any peripheral neuropathy.  He is here today for evaluation before starting cycle #3 of his treatment.   MEDICAL HISTORY: Past Medical History:  Diagnosis Date  . CLL (chronic lymphocytic leukemia) ([facility]) dx'd 2014   progression 12/2019  . Diabetes mellitus without complication ([facility])   . Diverticulosis   . External hemorrhoids   . GERD (gastroesophageal reflux disease)   . Hiatal hernia   . History of colon polyps   . Hyperlipidemia   . Hypertension   . Internal hemorrhoids   . Lymphocytosis   . Schatzki's ring   . Sleep apnea     ALLERGIES:  is allergic to glycopyrrolate.  MEDICATIONS:  Current Outpatient Medications  Medication Sig Dispense Refill  . acetaminophen (TYLENOL) 500 MG tablet Take 500 mg by mouth every 6 (six) hours as needed for moderate pain.    Marland Kitchen allopurinol (ZYLOPRIM) 100 MG tablet Take 1 tablet (100 mg total) by mouth 2 (two) times daily. 60 tablet 2  .  amLODipine (NORVASC) 5 MG tablet Take 1 tablet (5 mg total) by mouth daily. 90 tablet 0  . aspirin 81 MG EC tablet Take 81 mg by mouth daily. Swallow whole.    . benazepril (LOTENSIN) 20 MG tablet TAKE 1 TABLET BY MOUTH EVERY DAY 90 tablet 1  . carvedilol (COREG) 25 MG tablet TAKE ONE TABLET BY MOUTH TWICE DAILY  180 tablet 0  . esomeprazole (NEXIUM) 40 MG capsule TAKE 1 CAPSULE BY MOUTH EVERY MORNING 30 MINUTES  BEFORE BREAKFAST (Patient taking differently: Take 40 mg by mouth daily. ) 90 capsule 0  . ibuprofen (ADVIL) 200 MG tablet Take 200 mg by mouth every 6 (six) hours as needed for headache or mild pain.    . methylPREDNISolone (MEDROL DOSEPAK) 4 MG TBPK tablet Use as instructed. 21 tablet 0  . ONETOUCH VERIO test strip USE 1 STRIP TO CHECK GLUCOSE THREE TIMES DAILY 100 each 5  . pioglitazone (ACTOS) 15 MG tablet TAKE 1 TABLET BY MOUTH EVERY DAY 90 tablet 1  . potassium chloride SA (KLOR-CON) 20 MEQ tablet Take 1 tablet (20 mEq total) by mouth 2 (two) times daily. 14 tablet 0  . prochlorperazine (COMPAZINE) 10 MG tablet Take 1 tablet (10 mg total) by mouth every 6 (six) hours as needed for nausea or vomiting. 30 tablet 0  . rosuvastatin (CRESTOR) 5 MG tablet TAKE 1 TABLET BY MOUTH EVERY DAY 90 tablet 0  . doxazosin (CARDURA) 4 MG tablet Take 1/2 tablet (2mg  total) by mouth once daily. If systolic BP is below 193, stop taking. (Patient not taking: Reported on 05/30/2020) 45 tablet 1  . dronabinol (MARINOL) 2.5 MG capsule Take 1 capsule (2.5 mg total) by mouth 2 (two) times daily before a meal. 30 capsule 0  . ferrous sulfate 325 (65 FE) MG EC tablet TAKE 1 TABLET BY MOUTH 3 TIMES A DAY WITH MEALS (Patient not taking: No sig reported) 90 tablet 0  . fluconazole (DIFLUCAN) 200 MG tablet Take 1 tablet (200 mg total) by mouth daily. (Patient not taking: Reported on 05/30/2020) 10 tablet 0  . glucagon (GLUCAGON EMERGENCY) 1 MG injection Inject 1 mg into the skin once as needed (For severe low blood sugar). (Patient not taking: Reported on 05/30/2020) 1 each 5  . insulin aspart (NOVOLOG FLEXPEN) 100 UNIT/ML FlexPen Inject 3-4 Units into the skin 3 (three) times daily with meals. Inject 3-4 units under the skin three times daily before meals. (Patient not taking: Reported on 05/30/2020)    . insulin degludec (TRESIBA FLEXTOUCH) 100 UNIT/ML SOPN FlexTouch Pen inject 12 units daily. if fasting blood sugars are over 140  increase dosage up to 15 units daily as directed. (Patient not taking: Reported on 05/30/2020) 4 pen 4  . levofloxacin (LEVAQUIN) 500 MG tablet Take 1 tablet (500 mg total) by mouth daily. Begin on Saturday, May 29. (Patient not taking: Reported on 05/30/2020) 7 tablet 0  . metFORMIN (GLUCOPHAGE-XR) 500 MG 24 hr tablet Take 1 tablet (500 mg total) by mouth daily with supper. (Patient not taking: Reported on 05/16/2020) 90 tablet 3  . sildenafil (REVATIO) 20 MG tablet TAKE 5 TABLETS BY MOUTH AS NEEDED once a week. (Patient not taking: Reported on 05/30/2020) 30 tablet 2   No current facility-administered medications for this visit.   Facility-Administered Medications Ordered in Other Visits  Medication Dose Route Frequency Provider Last Rate Last Admin  . bendamustine (BENDEKA) 125 mg in sodium chloride 0.9 % 50 mL (2.2727 mg/mL) chemo infusion  70 mg/m2 (Treatment Plan) Intravenous Once Curt Bears, MD      . riTUXimab-pvvr (RUXIENCE) 600 mg in sodium chloride 0.9 % 190 mL infusion  375 mg/m2 (Treatment Plan) Intravenous Once Curt Bears, MD        SURGICAL HISTORY:  Past Surgical History:  Procedure Laterality Date  . BLEPHAROPLASTY    . COLONOSCOPY  2012  . HEMORRHOID SURGERY     Sclerotherapy  . KNEE SURGERY Right 2002  . POLYPECTOMY    . SHOULDER SURGERY Right 2007    REVIEW OF SYSTEMS:   Review of Systems  Constitutional: Positive for fatigue, generalized weakness, weight loss, baseline night sweats, and appetite change.  Negative for chills or fever. HENT: Negative for mouth sores, nosebleeds, sore throat and trouble swallowing.   Eyes: Negative for eye problems and icterus.  Respiratory: Negative for cough, hemoptysis, shortness of breath and wheezing.   Cardiovascular: Negative for chest pain and leg swelling.  Gastrointestinal: Positive for current constipation. negative for abdominal pain, diarrhea, nausea and vomiting.  Genitourinary: Negative for bladder  incontinence, difficulty urinating, dysuria, frequency and hematuria.   Musculoskeletal: Negative for back pain, gait problem, neck pain and neck stiffness.  Skin: Negative for itching and rash.  Neurological: Positive for light headedness with standing/position changes. Negative for dizziness, extremity weakness, gait problem, headaches,  and seizures.  Hematological: Negative for adenopathy. Does not bruise/bleed easily.  Psychiatric/Behavioral: Negative for confusion, depression and sleep disturbance. The patient is not nervous/anxious.     PHYSICAL EXAMINATION:  Blood pressure (!) 83/47, pulse 64, temperature 97.8 F (36.6 C), temperature source Temporal, resp. rate 18, height 5\' 7"  (1.702 m), weight 124 lb 6.4 oz (56.4 kg), SpO2 100 %.  ECOG PERFORMANCE STATUS: 1 - Symptomatic but completely ambulatory  Physical Exam  Constitutional: Oriented to person, place, and time and thin appearing male and in no distress.  HENT:  Head: Normocephalic and atraumatic.  Mouth/Throat: Oropharynx is clear and moist. No oropharyngeal exudate.  Eyes: Conjunctivae are normal. Right eye exhibits no discharge. Left eye exhibits no discharge. No scleral icterus.  Neck: Normal range of motion. Neck supple.  Cardiovascular: Normal rate, regular rhythm, normal heart sounds and intact distal pulses.   Pulmonary/Chest: Effort normal and breath sounds normal. No respiratory distress. No wheezes. No rales.  Abdominal: Soft. Bowel sounds are normal. Exhibits no distension and no mass. There is no tenderness.  Musculoskeletal: Normal range of motion. Exhibits no edema.  Lymphadenopathy:    No cervical adenopathy (improved) Neurological: Alert and oriented to person, place, and time. Exhibits normal muscle tone. Examined in the wheelchair.  Skin: Skin is warm and dry. No rash noted. Not diaphoretic. No erythema. No pallor.  Psychiatric: Mood, memory and judgment normal.  Vitals reviewed.  LABORATORY DATA: Lab  Results  Component Value Date   WBC 4.6 05/30/2020   HGB 9.7 (L) 05/30/2020   HCT 29.3 (L) 05/30/2020   MCV 96.4 05/30/2020   PLT 124 (L) 05/30/2020      Chemistry      Component Value Date/Time   NA 135 05/30/2020 1014   NA 141 08/06/2017 1306   K 3.7 05/30/2020 1014   K 4.3 08/06/2017 1306   CL 100 05/30/2020 1014   CO2 28 05/30/2020 1014   CO2 25 08/06/2017 1306   BUN 21 05/30/2020 1014   BUN 22.9 08/06/2017 1306   CREATININE 0.91 05/30/2020 1014   CREATININE 1.3 08/06/2017 1306      Component Value Date/Time  CALCIUM 8.8 (L) 05/30/2020 1014   CALCIUM 9.3 08/06/2017 1306   ALKPHOS 55 05/30/2020 1014   ALKPHOS 47 08/06/2017 1306   AST 8 (L) 05/30/2020 1014   AST 20 08/06/2017 1306   ALT 7 05/30/2020 1014   ALT 19 08/06/2017 1306   BILITOT 0.6 05/30/2020 1014   BILITOT 0.46 08/06/2017 1306       RADIOGRAPHIC STUDIES:  DG Chest 2 View  Result Date: 05/16/2020 CLINICAL DATA:  Shortness of breath. EXAM: CHEST - 2 VIEW COMPARISON:  November 29, 2007. FINDINGS: The heart size and mediastinal contours are within normal limits. Both lungs are clear. No pneumothorax or pleural effusion is noted. The visualized skeletal structures are unremarkable. IMPRESSION: No active cardiopulmonary disease. Aortic Atherosclerosis (ICD10-I70.0). Electronically Signed   By: Marijo Conception M.D.   On: 05/16/2020 12:48   CT Angio Chest PE W/Cm &/Or Wo Cm  Result Date: 05/16/2020 CLINICAL DATA:  Weakness, weight loss EXAM: CT ANGIOGRAPHY CHEST WITH CONTRAST TECHNIQUE: Multidetector CT imaging of the chest was performed using the standard protocol during bolus administration of intravenous contrast. Multiplanar CT image reconstructions and MIPs were obtained to evaluate the vascular anatomy. CONTRAST:  116mL OMNIPAQUE IOHEXOL 350 MG/ML SOLN COMPARISON:  None. FINDINGS: Cardiovascular: No filling defects in the pulmonary arteries to suggest pulmonary emboli. Heart is borderline in size. Coronary  artery and aortic atherosclerosis. Mediastinum/Nodes: No mediastinal, hilar, or axillary adenopathy. Trachea and esophagus are unremarkable. Thyroid unremarkable. Lungs/Pleura: Ovoid platelike density noted medially in the right lower lobe measuring 3.4 x 1.4 cm on image 101 of series 10. Nodular densities at the left lung base measuring up to 12 mm on image 101. Peripheral right lower lobe nodule measures 6 mm on image 91. No effusions. Upper Abdomen: Imaging into the upper abdomen shows no acute findings. Musculoskeletal: Chest wall soft tissues are unremarkable. No acute bony abnormality. Review of the MIP images confirms the above findings. IMPRESSION: No evidence of pulmonary embolus. Coronary artery disease. Multiple nodular densities in both lung bases and lower lobes, the largest a platelike ovoid density medially in the right lower lobe. Non-contrast chest CT at 3-6 months is recommended. If the nodules are stable at time of repeat CT, then future CT at 18-24 months (from today's scan) is considered optional for low-risk patients, but is recommended for high-risk patients. This recommendation follows the consensus statement: Guidelines for Management of Incidental Pulmonary Nodules Detected on CT Images: From the Fleischner Society 2017; Radiology 2017; 284:228-243. Aortic Atherosclerosis (ICD10-I70.0). Electronically Signed   By: Rolm Baptise M.D.   On: 05/16/2020 20:15     ASSESSMENT/PLAN:  This is a very pleasant 79 year old Caucasian male diagnosed with chronic lymphocytic leukemia. He was diagnosed in 2014. He has been on observation since that time until April 2021   In January 2021, the patient noticed left cervical lymphadenopathy and increase in his total WBC count. His anemia worsened as well.   He recently started systemic treatment with bendamustine 90 mg/M2 on days 1 and 2 and Rituxan 375 mg/M2 on day 1 every 4 weeks. He is status post two cycles of treatment. He has been  experiencing weight loss, appetite change, fatigue, and lightheadedness recently.   The patient was seen with Dr. Julien Nordmann today. Labs were reviewed. His ANC is 0.6 today. Dr. Julien Nordmann recommends that he proceed with cycle #3 day 1 today as scheduled; however, he would recommend reducing the dose of the bendamustine to 70 mg per metered squared for  this cycle of treatment.  Per pharmacy recommendation, the patient's Rituxan dose will also be reduced secondary to his recent weight loss.  Regarding his weight loss, the patient is scheduled to meet with a member of the nutritionist team while in the infusion room today.  Additionally, I will send a prescription for 2.5 mg of Marinol p.o. daily to his pharmacy to help with his appetite.  The patient is not a good candidate for steroids due to his diabetes mellitus.   The patient's blood sugar is elevated today.  He will receive 8 units of insulin while in the clinic and we will recheck his blood sugar in 1 hour.  The patient states that he checks his blood sugar at home daily.  The patient is aware of signs and symptoms of hyperglycemia as well as hypoglycemia.  The patient was also hypotensive today.  Denies any signs and symptoms of infection today. The patient is on 3 medications for his blood pressure and he took them this morning as prescribed.  The patient knows not to take his prescribed Viagra, especially with his hypotension which was discussed. We will reach out to the patient's prescribing physician to determine if the patient needs dose adjustment of his antihypertensive medications.  The patient will also receive 1 L of normal saline while in the clinic today.  Due to the patient's orthostatic hypotension, the patient was advised to change positions slowly, especially from going to a seated to standing position.  We will see the patient back for follow-up visit in 4 weeks for evaluation before starting his final cycle of treatment with cycle  #4.  Regarding constipation, the patient is planning on using magnesium citrate upon returning home today as instructed by the symptom management clinic on 05/19/2020. He did not want to take magnesium citrate prior to his appointment today. Denies recent nausea, vomiting, or abdominal pain.   The patient was advised to call immediately if he has any concerning symptoms in the interval. The patient voices understanding of current disease status and treatment options and is in agreement with the current care plan. All questions were answered. The patient knows to call the clinic with any problems, questions or concerns. We can certainly see the patient much sooner if necessary  No orders of the defined types were placed in this encounter.    Rayland Hamed L Tarun Patchell, PA-C 05/30/20  ADDENDUM: Hematology/Oncology Attending: I had a face-to-face encounter with the patient today.  I recommended his care plan.  This is a very pleasant 79 years old white male with chronic lymphocytic leukemia that showed evidence for disease progression recently.  The patient is currently undergoing systemic chemotherapy with bendamustine and Rituxan status post 2 cycles.  He is here today for evaluation before starting cycle #3.  He has been complaining of increasing fatigue and weakness.  He was also found to be hypotensive and he is on multiple blood pressure medication. I advised him to hold his blood pressure medication for now and discussed with his primary care physician adjusting his medication. We will arrange for the patient to receive 1 L of normal saline in the clinic today. I recommended for him to proceed with cycle #3 but because of the low neutrophil count, will reduce the dose of bendamustine to 70 mg/M2 on days 1 and 2 during the cycle. We will continue to monitor his white blood count closely on weekly blood work. He will come back for follow-up visit in 4 weeks for evaluation  before the last cycle of  his treatment. The patient was advised to call immediately if he has any concerning symptoms in the interval.  Disclaimer: This note was dictated with voice recognition software. Similar sounding words can inadvertently be transcribed and may be missed upon review. Eilleen Kempf, MD 05/30/20

## 2020-05-30 NOTE — Progress Notes (Signed)
Spoke with Cassie, Dr Julien Nordmann would like to reduce today's Bendeka dose from 90 to 70 mg/m2 due to Tony Marshall.  Larene Beach, PharmD

## 2020-05-30 NOTE — Telephone Encounter (Signed)
Please let patient know to stop amlodipine and doxazosin.

## 2020-05-30 NOTE — Telephone Encounter (Signed)
Colletta Maryland from [facility] ph# [phone] called re: Patient was hospitalized and Dr. Vito Backers Heilingoetter states that she wants patient's medications to be reviewed and if possible decreased due to chronic hypotension. If you have any questions please call Colletta Maryland at the ph# listed above.

## 2020-05-30 NOTE — Patient Instructions (Signed)
[facility] Discharge Instructions for Patients Receiving Chemotherapy  Today you received the following chemotherapy agents: Rituxumab, and Bendamustine Verl Dicker).  To help prevent nausea and vomiting after your treatment, we encourage you to take your nausea medication as directed by your MD.   If you develop nausea and vomiting that is not controlled by your nausea medication, call the clinic.   BELOW ARE SYMPTOMS THAT SHOULD BE REPORTED IMMEDIATELY:  *FEVER GREATER THAN 100.5 F  *CHILLS WITH OR WITHOUT FEVER  NAUSEA AND VOMITING THAT IS NOT CONTROLLED WITH YOUR NAUSEA MEDICATION  *UNUSUAL SHORTNESS OF BREATH  *UNUSUAL BRUISING OR BLEEDING  TENDERNESS IN MOUTH AND THROAT WITH OR WITHOUT PRESENCE OF ULCERS  *URINARY PROBLEMS  *BOWEL PROBLEMS  UNUSUAL RASH Items with * indicate a potential emergency and should be followed up as soon as possible.  Feel free to call the clinic should you have any questions or concerns. The clinic phone number is [phone].  Please show the [facility] at check-in to the Emergency Department and triage nurse.  Coronavirus (COVID-19) Are you at risk?  Are you at risk for the Coronavirus (COVID-19)?  To be considered HIGH RISK for Coronavirus (COVID-19), you have to meet the following criteria:  . Traveled to [place], [place], [place], [place] or [place]; or in the [place] to [place], [place], [place], or [place]; and have fever, cough, and shortness of breath within the last 2 weeks of travel OR . Been in close contact with a person diagnosed with COVID-19 within the last 2 weeks and have fever, cough, and shortness of breath . IF YOU DO NOT MEET THESE CRITERIA, YOU ARE CONSIDERED LOW RISK FOR COVID-19.  What to do if you are HIGH RISK for COVID-19?  Marland Kitchen If you are having a medical emergency, call 911. . Seek medical care right away. Before you go to a doctor's office, urgent care or emergency department,  call ahead and tell them about your recent travel, contact with someone diagnosed with COVID-19, and your symptoms. You should receive instructions from your physician's office regarding next steps of care.  . When you arrive at healthcare provider, tell the healthcare staff immediately you have returned from visiting [place], [place], [place], [place] or [place]; or traveled in the [place] to [place], [place], [place], or [place]; in the last two weeks or you have been in close contact with a person diagnosed with COVID-19 in the last 2 weeks.   . Tell the health care staff about your symptoms: fever, cough and shortness of breath. . After you have been seen by a medical provider, you will be either: o Tested for (COVID-19) and discharged home on quarantine except to seek medical care if symptoms worsen, and asked to  - Stay home and avoid contact with others until you get your results (4-5 days)  - Avoid travel on public transportation if possible (such as bus, train, or airplane) or o Sent to the Emergency Department by EMS for evaluation, COVID-19 testing, and possible admission depending on your condition and test results.  What to do if you are LOW RISK for COVID-19?  Reduce your risk of any infection by using the same precautions used for avoiding the common cold or flu:  Marland Kitchen Wash your hands often with soap and warm water for at least 20 seconds.  If soap and water are not readily available, use an alcohol-based hand sanitizer with at least 60% alcohol.  Marland Kitchen  If coughing or sneezing, cover your mouth and nose by coughing or sneezing into the elbow areas of your shirt or coat, into a tissue or into your sleeve (not your hands). . Avoid shaking hands with others and consider head nods or verbal greetings only. . Avoid touching your eyes, nose, or mouth with unwashed hands.  . Avoid close contact with people who are sick. . Avoid places or events with large numbers of people in one  location, like concerts or sporting events. . Carefully consider travel plans you have or are making. . If you are planning any travel outside or inside the [place], visit the CDC's Travelers' Health webpage for the latest health notices. . If you have some symptoms but not all symptoms, continue to monitor at home and seek medical attention if your symptoms worsen. . If you are having a medical emergency, call 911.   [facility] / e-Visit: [email or web address]         [facility]: [facility]: [phone]                   [facility]: [phone]

## 2020-05-30 NOTE — Progress Notes (Signed)
Per discussion with Cassie, PA, MD wanting to proceed with treatment with ANC 0.6 but adjusting dose.

## 2020-05-30 NOTE — Progress Notes (Signed)
Ok per [employer], PA to use today's wt (reduced) to calculate doses & Rituxan dose will change from 700 mg --> 600 mg.  Kennith Center, Pharm.D., CPP 05/30/2020@12 :21 PM

## 2020-05-30 NOTE — Progress Notes (Signed)
Nutrition follow-up completed with patient during infusion for CLL. Noted weight decreased to 124.4 pounds on June 15 down from 126 pounds June 1.  He has lost 19% of his body weight in less than 2 months which is significant. Noted labs: Glucose 293 and albumin 2.8.   I introduced myself to patient and explained that I had spoken with his wife yesterday on the phone.  Patient was aware and was happy I have done so. I asked patient if he was drinking oral nutrition supplements but he could not tell me what kind or how often.   He has not started MiraLAX yet.  States he wanted to wait till after today's treatment. I educated patient to try to drink at least 2-3 bottles of oral nutrition supplements daily. He did not have any questions for me. I provided patient with oral nutrition supplements as well as fact sheets.  Also provided my contact information and requested patient/patient wife to contact me with further questions.  **Disclaimer: This note was dictated with voice recognition software. Similar sounding words can inadvertently be transcribed and this note may contain transcription errors which may not have been corrected upon publication of note.**

## 2020-05-30 NOTE — Progress Notes (Signed)
Pt is approved for the $1000 [employer] grant.  

## 2020-05-30 NOTE — Telephone Encounter (Signed)
Called Colletta Maryland at the [facility] and made he aware of Dr. Ronnie Derby instructions. She verbalized understanding, and detailed voicemail was also left with pt wit MD instructions.

## 2020-05-30 NOTE — Progress Notes (Signed)
Repeat blood sugar check with glucometer was 244 at 1337

## 2020-05-31 ENCOUNTER — Inpatient Hospital Stay: Payer: Medicare HMO

## 2020-05-31 ENCOUNTER — Other Ambulatory Visit: Payer: Self-pay

## 2020-05-31 ENCOUNTER — Other Ambulatory Visit: Payer: Self-pay | Admitting: Internal Medicine

## 2020-05-31 ENCOUNTER — Telehealth: Payer: Self-pay | Admitting: Medical Oncology

## 2020-05-31 VITALS — BP 94/58 | HR 75 | Temp 97.8°F | Resp 18

## 2020-05-31 DIAGNOSIS — C9110 Chronic lymphocytic leukemia of B-cell type not having achieved remission: Secondary | ICD-10-CM

## 2020-05-31 DIAGNOSIS — Z5112 Encounter for antineoplastic immunotherapy: Secondary | ICD-10-CM | POA: Diagnosis not present

## 2020-05-31 MED ORDER — SODIUM CHLORIDE 0.9 % IV SOLN
Freq: Once | INTRAVENOUS | Status: AC
  Filled 2020-05-31: qty 250

## 2020-05-31 MED ORDER — SODIUM CHLORIDE 0.9 % IV SOLN
70.0000 mg/m2 | Freq: Once | INTRAVENOUS | Status: AC
  Administered 2020-05-31: 125 mg via INTRAVENOUS
  Filled 2020-05-31: qty 5

## 2020-05-31 MED ORDER — SODIUM CHLORIDE 0.9 % IV SOLN
10.0000 mg | Freq: Once | INTRAVENOUS | Status: AC
  Administered 2020-05-31: 10 mg via INTRAVENOUS
  Filled 2020-05-31: qty 10

## 2020-05-31 NOTE — Telephone Encounter (Signed)
Constipation-LBM "3 weeks ago" . He drank mag citrate today 1.5 hours ago and has not gone. I recommended 1 tbs MOM in 8 oz of warm prune juice. She was also told that he can try a Fleets enema ,gently inserted. I asked wife to call back tomorrow with update.

## 2020-05-31 NOTE — Patient Instructions (Signed)
[facility] Discharge Instructions for Patients Receiving Chemotherapy  Today you received the following chemotherapy agents: bendamustine.  To help prevent nausea and vomiting after your treatment, we encourage you to take your nausea medication as directed.   If you develop nausea and vomiting that is not controlled by your nausea medication, call the clinic.   BELOW ARE SYMPTOMS THAT SHOULD BE REPORTED IMMEDIATELY:  *FEVER GREATER THAN 100.5 F  *CHILLS WITH OR WITHOUT FEVER  NAUSEA AND VOMITING THAT IS NOT CONTROLLED WITH YOUR NAUSEA MEDICATION  *UNUSUAL SHORTNESS OF BREATH  *UNUSUAL BRUISING OR BLEEDING  TENDERNESS IN MOUTH AND THROAT WITH OR WITHOUT PRESENCE OF ULCERS  *URINARY PROBLEMS  *BOWEL PROBLEMS  UNUSUAL RASH Items with * indicate a potential emergency and should be followed up as soon as possible.  Feel free to call the clinic should you have any questions or concerns. The clinic phone number is [phone].  Please show the CHEMO ALERT CARD at check-in to the Emergency Department and triage nurse.   

## 2020-06-02 ENCOUNTER — Encounter: Payer: Self-pay | Admitting: Internal Medicine

## 2020-06-02 ENCOUNTER — Telehealth: Payer: Self-pay | Admitting: Physician Assistant

## 2020-06-02 NOTE — Telephone Encounter (Signed)
Scheduled per los. Called and left msg. Mailed printout  °

## 2020-06-02 NOTE — Progress Notes (Signed)
Pt is approved w/ [employer] to receive assistance up to $8,000 tohelp pay for out-of-pocket expenses related to cancer treatment effective6/1/21to5/31/22.

## 2020-06-05 ENCOUNTER — Encounter (HOSPITAL_COMMUNITY): Payer: Self-pay | Admitting: Emergency Medicine

## 2020-06-05 ENCOUNTER — Other Ambulatory Visit: Payer: Self-pay

## 2020-06-05 ENCOUNTER — Inpatient Hospital Stay (HOSPITAL_BASED_OUTPATIENT_CLINIC_OR_DEPARTMENT_OTHER): Payer: Medicare HMO | Admitting: Medical

## 2020-06-05 ENCOUNTER — Telehealth: Payer: Self-pay | Admitting: Medical Oncology

## 2020-06-05 ENCOUNTER — Emergency Department (HOSPITAL_COMMUNITY): Payer: Medicare HMO

## 2020-06-05 ENCOUNTER — Inpatient Hospital Stay (HOSPITAL_COMMUNITY)
Admission: EM | Admit: 2020-06-05 | Discharge: 2020-06-20 | DRG: 871 | Disposition: A | Discharge status: Home Health | Payer: Medicare HMO | Attending: Internal Medicine | Admitting: Internal Medicine

## 2020-06-05 ENCOUNTER — Other Ambulatory Visit: Payer: Self-pay | Admitting: Physician Assistant

## 2020-06-05 ENCOUNTER — Inpatient Hospital Stay: Payer: Medicare HMO

## 2020-06-05 ENCOUNTER — Other Ambulatory Visit (HOSPITAL_COMMUNITY): Payer: Self-pay

## 2020-06-05 ENCOUNTER — Encounter: Payer: Self-pay | Admitting: Internal Medicine

## 2020-06-05 VITALS — BP 148/82 | HR 103 | Temp 97.7°F | Resp 15 | Ht 67.0 in

## 2020-06-05 DIAGNOSIS — I700 Atherosclerosis of aorta: Secondary | ICD-10-CM | POA: Diagnosis present

## 2020-06-05 DIAGNOSIS — E860 Dehydration: Secondary | ICD-10-CM

## 2020-06-05 DIAGNOSIS — D6481 Anemia due to antineoplastic chemotherapy: Secondary | ICD-10-CM | POA: Diagnosis not present

## 2020-06-05 DIAGNOSIS — R197 Diarrhea, unspecified: Secondary | ICD-10-CM

## 2020-06-05 DIAGNOSIS — M109 Gout, unspecified: Secondary | ICD-10-CM | POA: Diagnosis present

## 2020-06-05 DIAGNOSIS — J9690 Respiratory failure, unspecified, unspecified whether with hypoxia or hypercapnia: Secondary | ICD-10-CM | POA: Diagnosis not present

## 2020-06-05 DIAGNOSIS — K5900 Constipation, unspecified: Secondary | ICD-10-CM | POA: Diagnosis not present

## 2020-06-05 DIAGNOSIS — I110 Hypertensive heart disease with heart failure: Secondary | ICD-10-CM | POA: Diagnosis present

## 2020-06-05 DIAGNOSIS — D61818 Other pancytopenia: Secondary | ICD-10-CM | POA: Diagnosis not present

## 2020-06-05 DIAGNOSIS — I10 Essential (primary) hypertension: Secondary | ICD-10-CM | POA: Diagnosis not present

## 2020-06-05 DIAGNOSIS — E861 Hypovolemia: Secondary | ICD-10-CM | POA: Diagnosis present

## 2020-06-05 DIAGNOSIS — R570 Cardiogenic shock: Secondary | ICD-10-CM | POA: Diagnosis not present

## 2020-06-05 DIAGNOSIS — E8729 Other acidosis: Secondary | ICD-10-CM

## 2020-06-05 DIAGNOSIS — J189 Pneumonia, unspecified organism: Secondary | ICD-10-CM

## 2020-06-05 DIAGNOSIS — Z86010 Personal history of colonic polyps: Secondary | ICD-10-CM

## 2020-06-05 DIAGNOSIS — R54 Age-related physical debility: Secondary | ICD-10-CM | POA: Diagnosis present

## 2020-06-05 DIAGNOSIS — K219 Gastro-esophageal reflux disease without esophagitis: Secondary | ICD-10-CM | POA: Diagnosis present

## 2020-06-05 DIAGNOSIS — I5041 Acute combined systolic (congestive) and diastolic (congestive) heart failure: Secondary | ICD-10-CM

## 2020-06-05 DIAGNOSIS — E785 Hyperlipidemia, unspecified: Secondary | ICD-10-CM | POA: Diagnosis present

## 2020-06-05 DIAGNOSIS — R069 Unspecified abnormalities of breathing: Secondary | ICD-10-CM

## 2020-06-05 DIAGNOSIS — I5043 Acute on chronic combined systolic (congestive) and diastolic (congestive) heart failure: Secondary | ICD-10-CM | POA: Diagnosis not present

## 2020-06-05 DIAGNOSIS — K5730 Diverticulosis of large intestine without perforation or abscess without bleeding: Secondary | ICD-10-CM | POA: Diagnosis not present

## 2020-06-05 DIAGNOSIS — N179 Acute kidney failure, unspecified: Secondary | ICD-10-CM | POA: Diagnosis not present

## 2020-06-05 DIAGNOSIS — Z87891 Personal history of nicotine dependence: Secondary | ICD-10-CM

## 2020-06-05 DIAGNOSIS — R6520 Severe sepsis without septic shock: Secondary | ICD-10-CM | POA: Diagnosis not present

## 2020-06-05 DIAGNOSIS — D701 Agranulocytosis secondary to cancer chemotherapy: Secondary | ICD-10-CM | POA: Diagnosis not present

## 2020-06-05 DIAGNOSIS — E440 Moderate protein-calorie malnutrition: Secondary | ICD-10-CM | POA: Diagnosis not present

## 2020-06-05 DIAGNOSIS — E1165 Type 2 diabetes mellitus with hyperglycemia: Secondary | ICD-10-CM

## 2020-06-05 DIAGNOSIS — A419 Sepsis, unspecified organism: Secondary | ICD-10-CM | POA: Diagnosis not present

## 2020-06-05 DIAGNOSIS — E872 Acidosis: Secondary | ICD-10-CM | POA: Diagnosis not present

## 2020-06-05 DIAGNOSIS — J9600 Acute respiratory failure, unspecified whether with hypoxia or hypercapnia: Secondary | ICD-10-CM | POA: Diagnosis not present

## 2020-06-05 DIAGNOSIS — N3289 Other specified disorders of bladder: Secondary | ICD-10-CM | POA: Diagnosis not present

## 2020-06-05 DIAGNOSIS — D649 Anemia, unspecified: Secondary | ICD-10-CM

## 2020-06-05 DIAGNOSIS — E11649 Type 2 diabetes mellitus with hypoglycemia without coma: Secondary | ICD-10-CM | POA: Diagnosis present

## 2020-06-05 DIAGNOSIS — Z888 Allergy status to other drugs, medicaments and biological substances status: Secondary | ICD-10-CM

## 2020-06-05 DIAGNOSIS — R5381 Other malaise: Secondary | ICD-10-CM

## 2020-06-05 DIAGNOSIS — G92 Toxic encephalopathy: Secondary | ICD-10-CM | POA: Diagnosis not present

## 2020-06-05 DIAGNOSIS — E0810 Diabetes mellitus due to underlying condition with ketoacidosis without coma: Secondary | ICD-10-CM

## 2020-06-05 DIAGNOSIS — E7800 Pure hypercholesterolemia, unspecified: Secondary | ICD-10-CM | POA: Diagnosis present

## 2020-06-05 DIAGNOSIS — Z9221 Personal history of antineoplastic chemotherapy: Secondary | ICD-10-CM

## 2020-06-05 DIAGNOSIS — R627 Adult failure to thrive: Secondary | ICD-10-CM | POA: Diagnosis not present

## 2020-06-05 DIAGNOSIS — G4733 Obstructive sleep apnea (adult) (pediatric): Secondary | ICD-10-CM | POA: Diagnosis not present

## 2020-06-05 DIAGNOSIS — M7989 Other specified soft tissue disorders: Secondary | ICD-10-CM | POA: Diagnosis not present

## 2020-06-05 DIAGNOSIS — R17 Unspecified jaundice: Secondary | ICD-10-CM | POA: Diagnosis present

## 2020-06-05 DIAGNOSIS — D72819 Decreased white blood cell count, unspecified: Secondary | ICD-10-CM

## 2020-06-05 DIAGNOSIS — E876 Hypokalemia: Secondary | ICD-10-CM | POA: Diagnosis not present

## 2020-06-05 DIAGNOSIS — L03115 Cellulitis of right lower limb: Secondary | ICD-10-CM | POA: Diagnosis present

## 2020-06-05 DIAGNOSIS — I429 Cardiomyopathy, unspecified: Secondary | ICD-10-CM | POA: Diagnosis present

## 2020-06-05 DIAGNOSIS — Z7982 Long term (current) use of aspirin: Secondary | ICD-10-CM

## 2020-06-05 DIAGNOSIS — B370 Candidal stomatitis: Secondary | ICD-10-CM | POA: Diagnosis present

## 2020-06-05 DIAGNOSIS — Z20822 Contact with and (suspected) exposure to covid-19: Secondary | ICD-10-CM | POA: Diagnosis present

## 2020-06-05 DIAGNOSIS — Z79899 Other long term (current) drug therapy: Secondary | ICD-10-CM

## 2020-06-05 DIAGNOSIS — I2510 Atherosclerotic heart disease of native coronary artery without angina pectoris: Secondary | ICD-10-CM | POA: Diagnosis present

## 2020-06-05 DIAGNOSIS — C9110 Chronic lymphocytic leukemia of B-cell type not having achieved remission: Secondary | ICD-10-CM

## 2020-06-05 DIAGNOSIS — Z681 Body mass index (BMI) 19 or less, adult: Secondary | ICD-10-CM | POA: Diagnosis not present

## 2020-06-05 DIAGNOSIS — G479 Sleep disorder, unspecified: Secondary | ICD-10-CM | POA: Diagnosis not present

## 2020-06-05 DIAGNOSIS — T451X5A Adverse effect of antineoplastic and immunosuppressive drugs, initial encounter: Secondary | ICD-10-CM | POA: Diagnosis present

## 2020-06-05 DIAGNOSIS — J9811 Atelectasis: Secondary | ICD-10-CM | POA: Diagnosis not present

## 2020-06-05 DIAGNOSIS — E1110 Type 2 diabetes mellitus with ketoacidosis without coma: Secondary | ICD-10-CM | POA: Diagnosis not present

## 2020-06-05 DIAGNOSIS — E878 Other disorders of electrolyte and fluid balance, not elsewhere classified: Secondary | ICD-10-CM | POA: Diagnosis not present

## 2020-06-05 DIAGNOSIS — E871 Hypo-osmolality and hyponatremia: Secondary | ICD-10-CM | POA: Diagnosis present

## 2020-06-05 DIAGNOSIS — Z794 Long term (current) use of insulin: Secondary | ICD-10-CM | POA: Diagnosis not present

## 2020-06-05 DIAGNOSIS — G9340 Encephalopathy, unspecified: Secondary | ICD-10-CM | POA: Diagnosis not present

## 2020-06-05 DIAGNOSIS — M79609 Pain in unspecified limb: Secondary | ICD-10-CM | POA: Diagnosis not present

## 2020-06-05 DIAGNOSIS — I340 Nonrheumatic mitral (valve) insufficiency: Secondary | ICD-10-CM | POA: Diagnosis not present

## 2020-06-05 DIAGNOSIS — I517 Cardiomegaly: Secondary | ICD-10-CM | POA: Diagnosis not present

## 2020-06-05 DIAGNOSIS — R531 Weakness: Secondary | ICD-10-CM | POA: Diagnosis not present

## 2020-06-05 DIAGNOSIS — L02415 Cutaneous abscess of right lower limb: Secondary | ICD-10-CM | POA: Diagnosis not present

## 2020-06-05 DIAGNOSIS — R6521 Severe sepsis with septic shock: Secondary | ICD-10-CM | POA: Diagnosis not present

## 2020-06-05 DIAGNOSIS — J90 Pleural effusion, not elsewhere classified: Secondary | ICD-10-CM | POA: Diagnosis not present

## 2020-06-05 DIAGNOSIS — Z0389 Encounter for observation for other suspected diseases and conditions ruled out: Secondary | ICD-10-CM | POA: Diagnosis not present

## 2020-06-05 DIAGNOSIS — R4182 Altered mental status, unspecified: Secondary | ICD-10-CM | POA: Diagnosis not present

## 2020-06-05 DIAGNOSIS — E119 Type 2 diabetes mellitus without complications: Secondary | ICD-10-CM | POA: Diagnosis not present

## 2020-06-05 DIAGNOSIS — M549 Dorsalgia, unspecified: Secondary | ICD-10-CM | POA: Diagnosis not present

## 2020-06-05 DIAGNOSIS — R5383 Other fatigue: Secondary | ICD-10-CM | POA: Diagnosis not present

## 2020-06-05 DIAGNOSIS — E43 Unspecified severe protein-calorie malnutrition: Secondary | ICD-10-CM | POA: Insufficient documentation

## 2020-06-05 LAB — BLOOD GAS, VENOUS
Acid-base deficit: 2.3 mmol/L — ABNORMAL HIGH (ref 0.0–2.0)
Bicarbonate: 21.1 mmol/L (ref 20.0–28.0)
O2 Saturation: 50.9 %
Patient temperature: 98.6
pCO2, Ven: 33.1 mmHg — ABNORMAL LOW (ref 44.0–60.0)
pH, Ven: 7.422 (ref 7.250–7.430)
pO2, Ven: <31 mmHg — CL (ref 32.0–45.0)

## 2020-06-05 LAB — CMP (CANCER CENTER ONLY)
ALT: 11 U/L (ref 0–44)
AST: 12 U/L — ABNORMAL LOW (ref 15–41)
Albumin: 3.2 g/dL — ABNORMAL LOW (ref 3.5–5.0)
Alkaline Phosphatase: 60 U/L (ref 38–126)
Anion gap: 20 — ABNORMAL HIGH (ref 5–15)
BUN: 19 mg/dL (ref 8–23)
CO2: 22 mmol/L (ref 22–32)
Calcium: 9 mg/dL (ref 8.9–10.3)
Chloride: 91 mmol/L — ABNORMAL LOW (ref 98–111)
Creatinine: 1.14 mg/dL (ref 0.61–1.24)
GFR, Est AFR Am: >60 mL/min (ref >60)
GFR, Estimated: >60 mL/min (ref >60)
Glucose, Bld: 348 mg/dL — ABNORMAL HIGH (ref 70–99)
Potassium: 3.4 mmol/L — ABNORMAL LOW (ref 3.5–5.1)
Sodium: 133 mmol/L — ABNORMAL LOW (ref 135–145)
Total Bilirubin: 1.5 mg/dL — ABNORMAL HIGH (ref 0.3–1.2)
Total Protein: 5.6 g/dL — ABNORMAL LOW (ref 6.5–8.1)

## 2020-06-05 LAB — CBC WITH DIFFERENTIAL/PLATELET
Abs Immature Granulocytes: 0.06 10*3/uL (ref 0.00–0.07)
Basophils Absolute: 0 10*3/uL (ref 0.0–0.1)
Basophils Relative: 0 %
Eosinophils Absolute: 0 10*3/uL (ref 0.0–0.5)
Eosinophils Relative: 0 %
HCT: 27.7 % — ABNORMAL LOW (ref 39.0–52.0)
Hemoglobin: 9.4 g/dL — ABNORMAL LOW (ref 13.0–17.0)
Immature Granulocytes: 3 %
Lymphocytes Relative: 82 %
Lymphs Abs: 2 10*3/uL (ref 0.7–4.0)
MCH: 33.1 pg (ref 26.0–34.0)
MCHC: 33.9 g/dL (ref 30.0–36.0)
MCV: 97.5 fL (ref 80.0–100.0)
Monocytes Absolute: 0.3 10*3/uL (ref 0.1–1.0)
Monocytes Relative: 13 %
Neutro Abs: 0.1 10*3/uL — ABNORMAL LOW (ref 1.7–7.7)
Neutrophils Relative %: 2 %
Platelets: 238 10*3/uL (ref 150–400)
RBC: 2.84 MIL/uL — ABNORMAL LOW (ref 4.22–5.81)
RDW: 16.8 % — ABNORMAL HIGH (ref 11.5–15.5)
WBC: 2.4 10*3/uL — ABNORMAL LOW (ref 4.0–10.5)
nRBC: 0 % (ref 0.0–0.2)

## 2020-06-05 LAB — LACTATE DEHYDROGENASE: LDH: 263 U/L — ABNORMAL HIGH (ref 98–192)

## 2020-06-05 LAB — URINALYSIS, ROUTINE W REFLEX MICROSCOPIC
Bacteria, UA: NONE SEEN
Bilirubin Urine: NEGATIVE
Glucose, UA: >=500 mg/dL — AB
Hgb urine dipstick: NEGATIVE
Ketones, ur: 80 mg/dL — AB
Leukocytes,Ua: NEGATIVE
Nitrite: NEGATIVE
Protein, ur: NEGATIVE mg/dL
Specific Gravity, Urine: 1.021 (ref 1.005–1.030)
pH: 5 (ref 5.0–8.0)

## 2020-06-05 LAB — CBC WITH DIFFERENTIAL (CANCER CENTER ONLY)
Abs Immature Granulocytes: 0.04 10*3/uL (ref 0.00–0.07)
Basophils Absolute: 0 10*3/uL (ref 0.0–0.1)
Basophils Relative: 0 %
Eosinophils Absolute: 0 10*3/uL (ref 0.0–0.5)
Eosinophils Relative: 0 %
HCT: 29.5 % — ABNORMAL LOW (ref 39.0–52.0)
Hemoglobin: 9.8 g/dL — ABNORMAL LOW (ref 13.0–17.0)
Immature Granulocytes: 1 %
Lymphocytes Relative: 89 %
Lymphs Abs: 3.6 10*3/uL (ref 0.7–4.0)
MCH: 31.9 pg (ref 26.0–34.0)
MCHC: 33.2 g/dL (ref 30.0–36.0)
MCV: 96.1 fL (ref 80.0–100.0)
Monocytes Absolute: 0.3 10*3/uL (ref 0.1–1.0)
Monocytes Relative: 7 %
Neutro Abs: 0.1 10*3/uL — CL (ref 1.7–7.7)
Neutrophils Relative %: 3 %
Platelet Count: 300 10*3/uL (ref 150–400)
RBC: 3.07 MIL/uL — ABNORMAL LOW (ref 4.22–5.81)
RDW: 16.5 % — ABNORMAL HIGH (ref 11.5–15.5)
WBC Count: 4.1 10*3/uL (ref 4.0–10.5)
nRBC: 0 % (ref 0.0–0.2)

## 2020-06-05 LAB — COMPREHENSIVE METABOLIC PANEL
ALT: 13 U/L (ref 0–44)
AST: 18 U/L (ref 15–41)
Albumin: 3.3 g/dL — ABNORMAL LOW (ref 3.5–5.0)
Alkaline Phosphatase: 47 U/L (ref 38–126)
Anion gap: 21 — ABNORMAL HIGH (ref 5–15)
BUN: 22 mg/dL (ref 8–23)
CO2: 21 mmol/L — ABNORMAL LOW (ref 22–32)
Calcium: 8.3 mg/dL — ABNORMAL LOW (ref 8.9–10.3)
Chloride: 89 mmol/L — ABNORMAL LOW (ref 98–111)
Creatinine, Ser: 1.03 mg/dL (ref 0.61–1.24)
GFR calc Af Amer: >60 mL/min (ref >60)
GFR calc non Af Amer: >60 mL/min (ref >60)
Glucose, Bld: 342 mg/dL — ABNORMAL HIGH (ref 70–99)
Potassium: 3.3 mmol/L — ABNORMAL LOW (ref 3.5–5.1)
Sodium: 131 mmol/L — ABNORMAL LOW (ref 135–145)
Total Bilirubin: 2.6 mg/dL — ABNORMAL HIGH (ref 0.3–1.2)
Total Protein: 5.7 g/dL — ABNORMAL LOW (ref 6.5–8.1)

## 2020-06-05 LAB — BASIC METABOLIC PANEL
Anion gap: 14 (ref 5–15)
BUN: 17 mg/dL (ref 8–23)
CO2: 24 mmol/L (ref 22–32)
Calcium: 8.3 mg/dL — ABNORMAL LOW (ref 8.9–10.3)
Chloride: 98 mmol/L (ref 98–111)
Creatinine, Ser: 0.99 mg/dL (ref 0.61–1.24)
GFR calc Af Amer: >60 mL/min (ref >60)
GFR calc non Af Amer: >60 mL/min (ref >60)
Glucose, Bld: 144 mg/dL — ABNORMAL HIGH (ref 70–99)
Potassium: 2.7 mmol/L — CL (ref 3.5–5.1)
Sodium: 136 mmol/L (ref 135–145)

## 2020-06-05 LAB — CBG MONITORING, ED
Glucose-Capillary: 114 mg/dL — ABNORMAL HIGH (ref 70–99)
Glucose-Capillary: 119 mg/dL — ABNORMAL HIGH (ref 70–99)
Glucose-Capillary: 132 mg/dL — ABNORMAL HIGH (ref 70–99)
Glucose-Capillary: 151 mg/dL — ABNORMAL HIGH (ref 70–99)
Glucose-Capillary: 229 mg/dL — ABNORMAL HIGH (ref 70–99)
Glucose-Capillary: 322 mg/dL — ABNORMAL HIGH (ref 70–99)
Glucose-Capillary: 336 mg/dL — ABNORMAL HIGH (ref 70–99)

## 2020-06-05 LAB — TROPONIN I (HIGH SENSITIVITY): Troponin I (High Sensitivity): 11 ng/L (ref <18)

## 2020-06-05 LAB — SARS CORONAVIRUS 2 BY RT PCR (HOSPITAL ORDER, PERFORMED IN ~~LOC~~ HOSPITAL LAB): SARS Coronavirus 2: NEGATIVE

## 2020-06-05 LAB — URIC ACID: Uric Acid, Serum: 4.2 mg/dL (ref 3.7–8.6)

## 2020-06-05 LAB — MAGNESIUM: Magnesium: 1.5 mg/dL — ABNORMAL LOW (ref 1.7–2.4)

## 2020-06-05 MED ORDER — SODIUM CHLORIDE 0.9 % IV SOLN: INTRAVENOUS | Status: DC

## 2020-06-05 MED ORDER — FENTANYL CITRATE (PF) 100 MCG/2ML IJ SOLN: 12.5000 ug | INTRAMUSCULAR | Status: DC | PRN

## 2020-06-05 MED ORDER — SODIUM CHLORIDE 0.9 % IV BOLUS
1000.0000 mL | Freq: Once | INTRAVENOUS | Status: AC
  Administered 2020-06-05: 1000 mL via INTRAVENOUS

## 2020-06-05 MED ORDER — PROMETHAZINE HCL 25 MG/ML IJ SOLN: 12.5000 mg | Freq: Four times a day (QID) | INTRAMUSCULAR | Status: DC | PRN

## 2020-06-05 MED ORDER — ACETAMINOPHEN 650 MG RE SUPP: 650.0000 mg | Freq: Four times a day (QID) | RECTAL | Status: DC | PRN

## 2020-06-05 MED ORDER — DEXTROSE-NACL 5-0.45 % IV SOLN: INTRAVENOUS | Status: DC

## 2020-06-05 MED ORDER — FOLIC ACID 5 MG/ML IJ SOLN: 1.0000 mg | Freq: Every day | INTRAMUSCULAR | Status: DC

## 2020-06-05 MED ORDER — DEXTROSE 50 % IV SOLN: 0.0000 mL | INTRAVENOUS | Status: DC | PRN

## 2020-06-05 MED ORDER — THIAMINE HCL 100 MG/ML IJ SOLN
100.0000 mg | Freq: Every day | INTRAMUSCULAR | Status: DC
  Administered 2020-06-05 – 2020-06-12 (×9): 100 mg via INTRAVENOUS
  Filled 2020-06-05 (×8): qty 2

## 2020-06-05 MED ORDER — ACETAMINOPHEN 650 MG RE SUPP
650.0000 mg | RECTAL | Status: AC
  Administered 2020-06-05: 650 mg via RECTAL
  Filled 2020-06-05: qty 1

## 2020-06-05 MED ORDER — LORAZEPAM 2 MG/ML IJ SOLN
1.0000 mg | Freq: Once | INTRAMUSCULAR | Status: AC
  Administered 2020-06-05: 1 mg via INTRAVENOUS
  Filled 2020-06-05: qty 1

## 2020-06-05 MED ORDER — PANTOPRAZOLE SODIUM 40 MG IV SOLR
40.0000 mg | INTRAVENOUS | Status: DC
  Administered 2020-06-05 – 2020-06-11 (×7): 40 mg via INTRAVENOUS
  Filled 2020-06-05 (×7): qty 40

## 2020-06-05 MED ORDER — ONDANSETRON HCL 4 MG/2ML IJ SOLN: 4.0000 mg | Freq: Four times a day (QID) | INTRAMUSCULAR | Status: DC | PRN

## 2020-06-05 MED ORDER — POTASSIUM CHLORIDE 10 MEQ/100ML IV SOLN
10.0000 meq | INTRAVENOUS | Status: AC
  Administered 2020-06-05 – 2020-06-06 (×4): 10 meq via INTRAVENOUS
  Filled 2020-06-05 (×4): qty 100

## 2020-06-05 MED ORDER — OXYMETAZOLINE HCL 0.05 % NA SOLN
1.0000 | Freq: Two times a day (BID) | NASAL | Status: DC | PRN
  Filled 2020-06-05: qty 15

## 2020-06-05 MED ORDER — ONDANSETRON HCL 4 MG PO TABS: 4.0000 mg | ORAL_TABLET | Freq: Four times a day (QID) | ORAL | Status: DC | PRN

## 2020-06-05 MED ORDER — MAGNESIUM SULFATE 2 GM/50ML IV SOLN
2.0000 g | Freq: Once | INTRAVENOUS | Status: AC
  Administered 2020-06-05: 2 g via INTRAVENOUS
  Filled 2020-06-05: qty 50

## 2020-06-05 MED ORDER — INSULIN REGULAR(HUMAN) IN NACL 100-0.9 UT/100ML-% IV SOLN
INTRAVENOUS | Status: DC
  Administered 2020-06-05: 11 [IU]/h via INTRAVENOUS
  Filled 2020-06-05: qty 100

## 2020-06-05 MED ORDER — FOLIC ACID 5 MG/ML IJ SOLN
1.0000 mg | Freq: Every day | INTRAMUSCULAR | Status: DC
  Administered 2020-06-05 – 2020-06-11 (×7): 1 mg via INTRAVENOUS
  Filled 2020-06-05 (×10): qty 0.2

## 2020-06-05 MED ORDER — ACETAMINOPHEN 325 MG PO TABS
650.0000 mg | ORAL_TABLET | Freq: Four times a day (QID) | ORAL | Status: DC | PRN
  Administered 2020-06-07 – 2020-06-19 (×19): 650 mg via ORAL
  Filled 2020-06-05 (×18): qty 2

## 2020-06-05 MED ORDER — POTASSIUM CHLORIDE 10 MEQ/100ML IV SOLN: 10.0000 meq | INTRAVENOUS | Status: DC

## 2020-06-05 MED ORDER — ENOXAPARIN SODIUM 40 MG/0.4ML ~~LOC~~ SOLN
40.0000 mg | SUBCUTANEOUS | Status: DC
  Administered 2020-06-05 – 2020-06-06 (×2): 40 mg via SUBCUTANEOUS
  Filled 2020-06-05 (×2): qty 0.4

## 2020-06-05 MED ORDER — LORAZEPAM 2 MG/ML IJ SOLN
0.5000 mg | INTRAMUSCULAR | Status: DC | PRN
  Administered 2020-06-06: 1 mg via INTRAVENOUS
  Administered 2020-06-07: 0.5 mg via INTRAVENOUS
  Administered 2020-06-08 – 2020-06-10 (×4): 1 mg via INTRAVENOUS
  Filled 2020-06-05 (×6): qty 1

## 2020-06-05 MED ORDER — SODIUM CHLORIDE 0.9 % IV SOLN
INTRAVENOUS | Status: DC
  Filled 2020-06-05 (×2): qty 250

## 2020-06-05 NOTE — Progress Notes (Signed)
[facility] Progress Note   Tony Marshall [id number] 01-Nov-1941 79 y.o.  Tony Marshall is managed by Dr. Fanny Bien. Mohamed  Actively treated with chemotherapy/immunotherapy/hormonal therapy: yes  Current therapy: Rituximab and Bendeka  Last treated: 05/30/2020 (cycle 2)  Next scheduled appointment with provider: 06/27/2020  Assessment: Plan:    Diarrhea, unspecified type - Plan: Magnesium  Dehydration - Plan: 0.9 %  sodium chloride infusion  CLL (chronic lymphocytic leukemia) ([place])  Diabetes mellitus due to underlying condition with ketoacidosis without coma, with long-term current use of insulin (HCC)  Hypomagnesemia   Diarrhea with hypomagnesemia: The patient's magnesium level was 1.5 today.  He was taken to the emergency room for evaluation and management given his altered mental status.  Chronic lymphocytic leukemia: The patient is status post cycle 2 of rituximab and Bendeka which was given on 05/30/2020.  Diabetes: The patient has not been taking his diabetic medications as he has not been eating much.  His blood sugar returned today at 348 with an anion gap of 20.  Based on this he was taken to the emergency room for evaluation and management.  Please see After Visit Summary for patient specific instructions.  Future Appointments  Date Time Provider [place]  06/06/2020 11:30 AM [facility] ONLY [facility] None  06/13/2020 11:30 AM [facility] None  06/20/2020 11:15 AM [facility] None  06/20/2020  1:45 PM [facility] LAB [facility] None  06/23/2020  1:00 PM Elayne Snare, MD [facility] None  06/27/2020  9:45 AM [facility] LAB 6 [facility] None  06/27/2020 10:15 AM Curt Bears, MD [facility] None  06/27/2020 11:00 AM [facility] INFUSION [facility] None  06/28/2020  8:45 AM [facility] INFUSION [facility] None    Orders Placed This Encounter  Procedures  . Magnesium       Subjective:    Patient ID:  Tony Marshall is a 79 y.o. (DOB August 24, 1941) male.  Chief Complaint: No chief complaint on file.   HPI [name]  is a 79 y.o. male with a diagnosis of chronic lymphocytic leukemia who is managed by Dr. Julien Nordmann.  The patient is status post cycle 2 of rituximab and Bendeka which was dosed on 05/30/2020 and completed on 05/31/2020.  He presents to the clinic today with his wife.  He had been constipated and then use magnesium citrate.  He has subsequently developed diarrhea.  His wife reports that he has been confused for 1 to 2 days.  She reports that he has not been taking his diabetic medications due to the fact that he has not been eating and drinking as well.  His blood sugar today returned at 348 with an anion gap of 20.  He reports that today is Friday that the years 2022 and the month is March.  He cannot name the president.  When asked to name our facility he simply said "this is where you go for treatment and stuff."  He has been having chills but has had no measurable fever or diaphoresis.   Medications: I have reviewed the patient's current medications.  Allergies:  Allergies  Allergen Reactions  . Glycopyrrolate Rash    Past Medical History:  Diagnosis Date  . CLL (chronic lymphocytic leukemia) ([place]) dx'd 2014   progression 12/2019  . Diabetes mellitus without complication ([place])   . Diverticulosis   . External hemorrhoids   . GERD (gastroesophageal reflux disease)   . Hiatal hernia   . History of colon polyps   .  Hyperlipidemia   . Hypertension   . Internal hemorrhoids   . Lymphocytosis   . Schatzki's ring   . Sleep apnea     Past Surgical History:  Procedure Laterality Date  . BLEPHAROPLASTY    . COLONOSCOPY  2012  . HEMORRHOID SURGERY     Sclerotherapy  . KNEE SURGERY Right 2002  . POLYPECTOMY    . SHOULDER SURGERY Right 2007    Family History  Problem Relation Age of Onset  . Kidney disease Mother   . CAD Maternal Aunt   .  Colon cancer Neg Hx   . Esophageal cancer Neg Hx   . Rectal cancer Neg Hx   . Stomach cancer Neg Hx     Social History   Socioeconomic History  . Marital status: Married    Spouse name: Not on file  . Number of children: 1  . Years of education: Not on file  . Highest education level: Not on file  Occupational History  . Occupation: Retail buyer: [employer]  Tobacco Use  . Smoking status: Never Smoker  . Smokeless tobacco: Former Systems developer    Types: Secondary school teacher  . Vaping Use: Never used  Substance and Sexual Activity  . Alcohol use: Yes    Alcohol/week: 6.0 standard drinks    Types: 6 Cans of beer per week  . Drug use: No  . Sexual activity: Yes  Other Topics Concern  . Not on file  Social History Narrative  . Not on file   Social Determinants of Health   Financial Resource Strain:   . Difficulty of Paying Living Expenses:   Food Insecurity:   . Worried About Charity fundraiser in the Last Year:   . Arboriculturist in the Last Year:   Transportation Needs:   . Film/video editor (Medical):   Marland Kitchen Lack of Transportation (Non-Medical):   Physical Activity:   . Days of Exercise per Week:   . Minutes of Exercise per Session:   Stress:   . Feeling of Stress :   Social Connections:   . Frequency of Communication with Friends and Family:   . Frequency of Social Gatherings with Friends and Family:   . Attends Religious Services:   . Active Member of Clubs or Organizations:   . Attends Archivist Meetings:   Marland Kitchen Marital Status:   Intimate Partner Violence:   . Fear of Current or Ex-Partner:   . Emotionally Abused:   Marland Kitchen Physically Abused:   . Sexually Abused:     Past Medical History, Surgical history, Social history, and Family history were reviewed and updated as appropriate.   Please see review of systems for further details on the patient's review from today.   Review of Systems:  Review of Systems  Constitutional:  Positive for chills. Negative for diaphoresis and fever.  HENT: Negative for trouble swallowing and voice change.   Respiratory: Negative for cough, chest tightness, shortness of breath and wheezing.   Cardiovascular: Negative for chest pain and palpitations.  Gastrointestinal: Positive for diarrhea. Negative for abdominal pain, constipation, nausea and vomiting.  Musculoskeletal: Negative for back pain and myalgias.  Neurological: Positive for dizziness. Negative for light-headedness and headaches.  Psychiatric/Behavioral: Positive for confusion.    Objective:   Physical Exam:  BP (!) 148/82 (BP Location: Left Arm, Patient Position: Sitting)   Pulse (!) 103   Temp 97.7 F (36.5 C) (Temporal)   Resp  15   Ht 5\' 7"  (1.702 m)   SpO2 100%   BMI 19.48 kg/m  ECOG: 1  Physical Exam Constitutional:      General: He is not in acute distress.    Appearance: He is not diaphoretic.  HENT:     Head: Normocephalic and atraumatic.  Cardiovascular:     Rate and Rhythm: Normal rate and regular rhythm.     Heart sounds: Normal heart sounds. No murmur heard.  No friction rub. No gallop.   Pulmonary:     Effort: Pulmonary effort is normal. No respiratory distress.     Breath sounds: Normal breath sounds. No wheezing or rales.  Skin:    General: Skin is warm and dry.     Findings: No erythema or rash.  Neurological:     Mental Status: He is alert.     Gait: Gait abnormal (The patient is ambulating with the use of a wheelchair.).  Psychiatric:        Mood and Affect: Mood is anxious.        Cognition and Memory: Cognition is impaired.     Comments: The patient is unable to tell me the name of the president.  He states that today is Friday, the year is 2022 and the month is March.     Lab Review:     Component Value Date/Time   NA 131 (L) 06/05/2020 1518   NA 141 08/06/2017 1306   K 3.3 (L) 06/05/2020 1518   K 4.3 08/06/2017 1306   CL 89 (L) 06/05/2020 1518   CO2 21 (L) 06/05/2020  1518   CO2 25 08/06/2017 1306   GLUCOSE 342 (H) 06/05/2020 1518   GLUCOSE 150 (H) 08/06/2017 1306   BUN 22 06/05/2020 1518   BUN 22.9 08/06/2017 1306   CREATININE 1.03 06/05/2020 1518   CREATININE 1.14 06/05/2020 1230   CREATININE 1.3 08/06/2017 1306   CALCIUM 8.3 (L) 06/05/2020 1518   CALCIUM 9.3 08/06/2017 1306   PROT 5.7 (L) 06/05/2020 1518   PROT 6.5 08/06/2017 1306   ALBUMIN 3.3 (L) 06/05/2020 1518   ALBUMIN 3.8 08/06/2017 1306   AST 18 06/05/2020 1518   AST 12 (L) 06/05/2020 1230   AST 20 08/06/2017 1306   ALT 13 06/05/2020 1518   ALT 11 06/05/2020 1230   ALT 19 08/06/2017 1306   ALKPHOS 47 06/05/2020 1518   ALKPHOS 47 08/06/2017 1306   BILITOT 2.6 (H) 06/05/2020 1518   BILITOT 1.5 (H) 06/05/2020 1230   BILITOT 0.46 08/06/2017 1306   GFRNONAA >60 06/05/2020 1518   GFRNONAA >60 06/05/2020 1230   GFRAA >60 06/05/2020 1518   GFRAA >60 06/05/2020 1230       Component Value Date/Time   WBC 2.4 (L) 06/05/2020 1518   WBC 4.1 06/05/2020 1230   RBC 2.84 (L) 06/05/2020 1518   HGB 9.4 (L) 06/05/2020 1518   HGB 9.8 (L) 06/05/2020 1230   HGB 13.2 11/05/2017 1308   HCT 27.7 (L) 06/05/2020 1518   HCT 40.8 11/05/2017 1308   PLT 238 06/05/2020 1518   PLT 300 06/05/2020 1230   PLT 205 11/05/2017 1308   MCV 97.5 06/05/2020 1518   MCV 95.8 11/05/2017 1308   MCH 33.1 06/05/2020 1518   MCHC 33.9 06/05/2020 1518   RDW 16.8 (H) 06/05/2020 1518   RDW 14.0 11/05/2017 1308   LYMPHSABS 2.0 06/05/2020 1518   LYMPHSABS 66.1 (H) 11/05/2017 1308   MONOABS 0.3 06/05/2020 1518   MONOABS 1.4 (H)  11/05/2017 1308   EOSABS 0.0 06/05/2020 1518   EOSABS 0.3 11/05/2017 1308   BASOSABS 0.0 06/05/2020 1518   BASOSABS 0.2 (H) 11/05/2017 1308   -------------------------------  Imaging from last 24 hours (if applicable):  Radiology interpretation: DG Chest 2 View  Result Date: 05/16/2020 CLINICAL DATA:  Shortness of breath. EXAM: CHEST - 2 VIEW COMPARISON:  November 29, 2007. FINDINGS: The  heart size and mediastinal contours are within normal limits. Both lungs are clear. No pneumothorax or pleural effusion is noted. The visualized skeletal structures are unremarkable. IMPRESSION: No active cardiopulmonary disease. Aortic Atherosclerosis (ICD10-I70.0). Electronically Signed   By: Marijo Conception M.D.   On: 05/16/2020 12:48   CT Head Wo Contrast  Result Date: 06/05/2020 CLINICAL DATA:  Altered mental status fatigue and weakness EXAM: CT HEAD WITHOUT CONTRAST TECHNIQUE: Contiguous axial images were obtained from the base of the skull through the vertex without intravenous contrast. COMPARISON:  MRI 11/07/2014 FINDINGS: Brain: No acute territorial infarction, hemorrhage, or intracranial mass. Mild to moderate atrophy. Mild hypodensity in the white matter consistent with chronic small vessel ischemic change. Prominent ventricle size, likely due to atrophy. Vascular: No hyperdense vessels.  No unexpected calcification Skull: Motion artifact.  No gross fracture Sinuses/Orbits: No acute finding. Other: None IMPRESSION: 1. No CT evidence for acute intracranial abnormality. 2. Atrophy and mild chronic small vessel ischemic changes of the white matter. Electronically Signed   By: Donavan Foil M.D.   On: 06/05/2020 17:29   CT Angio Chest PE W/Cm &/Or Wo Cm  Result Date: 05/16/2020 CLINICAL DATA:  Weakness, weight loss EXAM: CT ANGIOGRAPHY CHEST WITH CONTRAST TECHNIQUE: Multidetector CT imaging of the chest was performed using the standard protocol during bolus administration of intravenous contrast. Multiplanar CT image reconstructions and MIPs were obtained to evaluate the vascular anatomy. CONTRAST:  153mL OMNIPAQUE IOHEXOL 350 MG/ML SOLN COMPARISON:  None. FINDINGS: Cardiovascular: No filling defects in the pulmonary arteries to suggest pulmonary emboli. Heart is borderline in size. Coronary artery and aortic atherosclerosis. Mediastinum/Nodes: No mediastinal, hilar, or axillary adenopathy. Trachea  and esophagus are unremarkable. Thyroid unremarkable. Lungs/Pleura: Ovoid platelike density noted medially in the right lower lobe measuring 3.4 x 1.4 cm on image 101 of series 10. Nodular densities at the left lung base measuring up to 12 mm on image 101. Peripheral right lower lobe nodule measures 6 mm on image 91. No effusions. Upper Abdomen: Imaging into the upper abdomen shows no acute findings. Musculoskeletal: Chest wall soft tissues are unremarkable. No acute bony abnormality. Review of the MIP images confirms the above findings. IMPRESSION: No evidence of pulmonary embolus. Coronary artery disease. Multiple nodular densities in both lung bases and lower lobes, the largest a platelike ovoid density medially in the right lower lobe. Non-contrast chest CT at 3-6 months is recommended. If the nodules are stable at time of repeat CT, then future CT at 18-24 months (from today's scan) is considered optional for low-risk patients, but is recommended for high-risk patients. This recommendation follows the consensus statement: Guidelines for Management of Incidental Pulmonary Nodules Detected on CT Images: From the Fleischner Society 2017; Radiology 2017; 284:228-243. Aortic Atherosclerosis (ICD10-I70.0). Electronically Signed   By: Rolm Baptise M.D.   On: 05/16/2020 20:15   DG Chest Portable 1 View  Result Date: 06/05/2020 CLINICAL DATA:  Fatigue and weakness. EXAM: PORTABLE CHEST 1 VIEW COMPARISON:  May 16, 2020 FINDINGS: The study is limited secondary to patient rotation. There is no evidence of acute infiltrate, pleural effusion or  pneumothorax. The heart size and mediastinal contours are within normal limits. A chronic tenth left rib fracture is seen. The visualized skeletal structures are otherwise unremarkable. IMPRESSION: No active disease. Electronically Signed   By: Virgina Norfolk M.D.   On: 06/05/2020 16:07        This case was discussed with Dr. Julien Nordmann. He expressed agreement with my  management of this patient.

## 2020-06-05 NOTE — H&P (Signed)
History and Physical    Tony Marshall:[id number] DOB: 04/01/1941 DOA: 06/05/2020  PCP: Elayne Snare, MD Patient coming from: cancer center office  Chief Complaint: fatigue - altered mental status   HPI: 79 y.o. male with history of DM2, diverticulosis, GERD, HLD, HTN, and CLL diagnosed October 2014 undergoing active treatment with bendamustine and Rituxan complicated by recurrent episodes of fatigue, generalized weakness, weight loss, lightheadedness, and poor appetite.  He was previously seen in the ED 6/1 with severe dehydration and a 17 pound weight loss but was able to be sent home after volume resuscitation.  His wife is present at the bedside and states that he "really has not recovered from his most recent treatment 6/15."  She reports that he had severe constipation a few days ago, used over-the-counter agents to treat this, and since then has had refractory diarrhea.  He has refused to eat or drink nearly anything and has been getting weaker and weaker.  His wife has been encouraging him to eat but he will not do so.  Today he became very confused.  His wife brought him to the cancer center to be seen in the clinic where he was found to have a magnesium of 1.5 and to be significantly altered.  He was therefore transferred to the ED for evaluation.  In the ED he remains altered, is extremely weak, and is noted to be suffering with significant hyperglycemia.  Assessment/Plan  Severe failure to thrive -dehydration -cachexia Appears to primarily be driven by side effects of his most recent chemotherapy -symptom control -volume expansion -to balance all electrolytes  Hyponatremia Sodium 131 at presentation -most consistent with volume depletion -volume resuscitate and follow trend  Hypokalemia Due to increased GI losses and very poor intake -supplement and follow  Hypomagnesemia Magnesium 1.5 -due to GI losses and very poor intake -supplement and follow  Metabolic acidosis with  anion gap Likely a combination of starvation, probable low-grade lactic acidosis, and potential early ketoacidosis -hydrate and control CBGs  Uncontrolled DM with probable early DKA with AMS Followed by Dr. Dwyane Dee w/ Endocrinology -Per review of records the patient requires insulin but at very low doses for appropriate control of his CBGs -an insulin drip has been initiated but the patient's labs are suggestive of only a very mild early DKA if even true DKA at all -I will continue the insulin drip for now but will have a low threshold to transition him off the drip to scheduled insulin   Toxic metabolic encephalopathy -altered mental status Most consistent with metabolic derangements -correct electrolyte abnormalities -check ammonia, I14, folic acid -CT head without acute finding -no focal neurologic deficits  CLL Originally diagnosed 2014 - monitor on observation only since that time until April 2021 when cervical lymphadenopathy, leukocytosis, and progressive anemia developed and the decision was made to initiate treatment -undergoing treatment with bendamustine and Rituxan day 1 every 4 weeks with his most recent cycle 6/15 (#3)  Normocytic anemia No evidence of acute blood loss -due to CLL and treatment of same  Leukocytopenia Due to treatment of CLL   DVT prophylaxis: Lovenox Code Status: Full Family Communication: Spoke with wife at bedside Disposition Plan: Admit to progressive care bed Consults called: None needed  Review of Systems: As per HPI otherwise 10 point review of systems negative.   Past Medical History:  Diagnosis Date  . CLL (chronic lymphocytic leukemia) ([place]) dx'd 2014   progression 12/2019  . Diabetes mellitus without complication ([name])   .  Diverticulosis   . External hemorrhoids   . GERD (gastroesophageal reflux disease)   . Hiatal hernia   . History of colon polyps   . Hyperlipidemia   . Hypertension   . Internal hemorrhoids   . Lymphocytosis   .  Schatzki's ring   . Sleep apnea     Past Surgical History:  Procedure Laterality Date  . BLEPHAROPLASTY    . COLONOSCOPY  2012  . HEMORRHOID SURGERY     Sclerotherapy  . KNEE SURGERY Right 2002  . POLYPECTOMY    . SHOULDER SURGERY Right 2007    Family History  Family History  Problem Relation Age of Onset  . Kidney disease Mother   . CAD Maternal Aunt   . Colon cancer Neg Hx   . Esophageal cancer Neg Hx   . Rectal cancer Neg Hx   . Stomach cancer Neg Hx     Social History   reports that he has never smoked. He quit smokeless tobacco use about 7 years ago.  His smokeless tobacco use included chew. He reports current alcohol use of about 6.0 standard drinks of alcohol per week. He reports that he does not use drugs.  Allergies Allergies  Allergen Reactions  . Glycopyrrolate Rash    Prior to Admission medications   Medication Sig Start Date End Date Taking? Authorizing Provider  acetaminophen (TYLENOL) 500 MG tablet Take 500 mg by mouth every 6 (six) hours as needed for moderate pain.   Yes [provider]  allopurinol (ZYLOPRIM) 100 MG tablet Take 1 tablet (100 mg total) by mouth 2 (two) times daily. 03/28/20  Yes Curt Bears, MD  amLODipine (NORVASC) 5 MG tablet Take 1 tablet (5 mg total) by mouth daily. 03/27/20  Yes Elayne Snare, MD  aspirin 81 MG EC tablet Take 81 mg by mouth daily. Swallow whole.   Yes [provider]  benazepril (LOTENSIN) 20 MG tablet TAKE 1 TABLET BY MOUTH EVERY DAY 03/21/20  Yes Elayne Snare, MD  carvedilol (COREG) 25 MG tablet TAKE ONE TABLET BY MOUTH TWICE DAILY  02/10/20  Yes Elayne Snare, MD  doxazosin (CARDURA) 4 MG tablet Take 1/2 tablet (2mg  total) by mouth once daily. If systolic BP is below 062, stop taking. 05/22/20  Yes Elayne Snare, MD  dronabinol (MARINOL) 2.5 MG capsule Take 1 capsule (2.5 mg total) by mouth 2 (two) times daily before a meal. 05/30/20  Yes Heilingoetter, Cassandra L, PA-C  esomeprazole (NEXIUM) 40 MG  capsule TAKE 1 CAPSULE BY MOUTH EVERY MORNING 30 MINUTES BEFORE BREAKFAST Patient taking differently: Take 40 mg by mouth daily.  04/28/20  Yes Pyrtle, Lajuan Lines, MD  glucagon (GLUCAGON EMERGENCY) 1 MG injection Inject 1 mg into the skin once as needed (For severe low blood sugar). 06/14/19  Yes Elayne Snare, MD  ibuprofen (ADVIL) 200 MG tablet Take 200 mg by mouth every 6 (six) hours as needed for headache or mild pain.   Yes [provider]  insulin aspart (NOVOLOG FLEXPEN) 100 UNIT/ML FlexPen Inject 3-4 Units into the skin 3 (three) times daily with meals. Inject 3-4 units under the skin three times daily before meals.    Yes [provider]  insulin degludec (TRESIBA FLEXTOUCH) 100 UNIT/ML SOPN FlexTouch Pen inject 12 units daily. if fasting blood sugars are over 140 increase dosage up to 15 units daily as directed. 03/22/19  Yes Elayne Snare, MD  loperamide (IMODIUM A-D) 2 MG tablet Take 2 mg by mouth 4 (four) times daily  as needed for diarrhea or loose stools.   Yes [provider]  metFORMIN (GLUCOPHAGE-XR) 500 MG 24 hr tablet Take 1 tablet (500 mg total) by mouth daily with supper. 09/16/19  Yes Elayne Snare, MD  oxymetazoline (AFRIN) 0.05 % nasal spray Place 1 spray into both nostrils 2 (two) times daily as needed for congestion.   Yes [provider]  pioglitazone (ACTOS) 15 MG tablet TAKE 1 TABLET BY MOUTH EVERY DAY 03/21/20  Yes Elayne Snare, MD  potassium chloride SA (KLOR-CON) 20 MEQ tablet Take 1 tablet (20 mEq total) by mouth 2 (two) times daily. 05/12/20  Yes Tanner, Lyndon Code., PA-C  prochlorperazine (COMPAZINE) 10 MG tablet Take 1 tablet (10 mg total) by mouth every 6 (six) hours as needed for nausea or vomiting. 03/28/20  Yes Curt Bears, MD  rosuvastatin (CRESTOR) 5 MG tablet TAKE 1 TABLET BY MOUTH EVERY DAY 04/21/20  Yes Elayne Snare, MD  ferrous sulfate 325 (65 FE) MG EC tablet TAKE 1 TABLET BY MOUTH 3 TIMES A DAY WITH MEALS Patient not taking: No sig reported  12/28/19   Curt Bears, MD  fluconazole (DIFLUCAN) 200 MG tablet Take 1 tablet (200 mg total) by mouth daily. Patient not taking: Reported on 05/30/2020 05/12/20   Harle Stanford., PA-C  levofloxacin (LEVAQUIN) 500 MG tablet Take 1 tablet (500 mg total) by mouth daily. Begin on Saturday, May 29. Patient not taking: Reported on 05/30/2020 05/12/20   Harle Stanford., PA-C  [facility] VERIO test strip USE 1 STRIP TO CHECK GLUCOSE THREE TIMES DAILY 02/24/20   Elayne Snare, MD  sildenafil (REVATIO) 20 MG tablet TAKE 5 TABLETS BY MOUTH AS NEEDED once a week. Patient not taking: Reported on 05/30/2020 04/16/16   Elayne Snare, MD    Physical Exam: Vitals:   06/05/20 1450 06/05/20 1451 06/05/20 1453 06/05/20 1600  BP: (!) 155/70   127/74  Pulse: 96   98  Resp: 19   (!) 22  Temp: 97.8 F (36.6 C)     TempSrc: Oral     SpO2: 100%  100% 99%  Weight:  54.9 kg    Height:  5' 8.5" (1.74 m)      Constitutional: Profoundly weak appearing, cachectic, altered/confused Eyes: PERRL, lids and conjunctivae normal ENMT: Exceedingly dry mucous membranes without purulence or erythema Neck: normal, supple, no masses, no thyromegaly Respiratory: clear to auscultation bilaterally, no wheezing, no crackles. Normal respiratory effort. No accessory muscle use.  Cardiovascular: Mildly tachycardic, no murmur or rub Abdomen: no tenderness, no masses palpated. No hepatosplenomegaly. Bowel sounds positive.  Musculoskeletal: Diffuse muscular atrophy, cachectic Skin: no rashes, lesions, ulcers. No induration Neurologic: Alert but severely altered and somnolent/lethargic, moves all 4 extremities but displays 4-/5 strength diffusely, no Babinski, no cranial nerve deficits bilaterally Psychiatric: Alert but agitated/confused and unable to provide a reliable history   Labs on Admission:   CBC: Recent Labs  Lab 05/30/20 1014 06/05/20 1230 06/05/20 1518  WBC 4.6 4.1 2.4*  NEUTROABS 0.6* 0.1* 0.1*  HGB 9.7* 9.8* 9.4*  HCT  29.3* 29.5* 27.7*  MCV 96.4 96.1 97.5  PLT 124* 300 741   Basic Metabolic Panel: Recent Labs  Lab 05/30/20 1014 06/05/20 1230 06/05/20 1518  NA 135 133* 131*  K 3.7 3.4* 3.3*  CL 100 91* 89*  CO2 28 22 21*  GLUCOSE 293* 348* 342*  BUN 21 19 22   CREATININE 0.91 1.14 1.03  CALCIUM 8.8* 9.0 8.3*  MG  --  1.5*  --  GFR: Estimated Creatinine Clearance: 47.4 mL/min (by C-G formula based on SCr of 1.03 mg/dL). Liver Function Tests: Recent Labs  Lab 05/30/20 1014 06/05/20 1230 06/05/20 1518  AST 8* 12* 18  ALT 7 11 13   ALKPHOS 55 60 47  BILITOT 0.6 1.5* 2.6*  PROT 4.7* 5.6* 5.7*  ALBUMIN 2.8* 3.2* 3.3*   CBG: Recent Labs  Lab 06/05/20 1513 06/05/20 1718  GLUCAP 336* 322*   Urine analysis:    Component Value Date/Time   COLORURINE YELLOW 06/05/2020 1518   APPEARANCEUR CLEAR 06/05/2020 1518   LABSPEC 1.021 06/05/2020 1518   PHURINE 5.0 06/05/2020 1518   GLUCOSEU >=500 (A) 06/05/2020 1518   GLUCOSEU >=1000 (A) 04/07/2017 0941   HGBUR NEGATIVE 06/05/2020 1518   BILIRUBINUR NEGATIVE 06/05/2020 1518   BILIRUBINUR negative 07/08/2018 1523   KETONESUR 80 (A) 06/05/2020 1518   PROTEINUR NEGATIVE 06/05/2020 1518   UROBILINOGEN 0.2 07/08/2018 1523   UROBILINOGEN 0.2 04/07/2017 0941   NITRITE NEGATIVE 06/05/2020 1518   LEUKOCYTESUR NEGATIVE 06/05/2020 1518    Radiological Exams on Admission: CT Head Wo Contrast  Result Date: 06/05/2020 CLINICAL DATA:  Altered mental status fatigue and weakness EXAM: CT HEAD WITHOUT CONTRAST TECHNIQUE: Contiguous axial images were obtained from the base of the skull through the vertex without intravenous contrast. COMPARISON:  MRI 11/07/2014 FINDINGS: Brain: No acute territorial infarction, hemorrhage, or intracranial mass. Mild to moderate atrophy. Mild hypodensity in the white matter consistent with chronic small vessel ischemic change. Prominent ventricle size, likely due to atrophy. Vascular: No hyperdense vessels.  No unexpected  calcification Skull: Motion artifact.  No gross fracture Sinuses/Orbits: No acute finding. Other: None IMPRESSION: 1. No CT evidence for acute intracranial abnormality. 2. Atrophy and mild chronic small vessel ischemic changes of the white matter. Electronically Signed   By: Donavan Foil M.D.   On: 06/05/2020 17:29   DG Chest Portable 1 View  Result Date: 06/05/2020 CLINICAL DATA:  Fatigue and weakness. EXAM: PORTABLE CHEST 1 VIEW COMPARISON:  May 16, 2020 FINDINGS: The study is limited secondary to patient rotation. There is no evidence of acute infiltrate, pleural effusion or pneumothorax. The heart size and mediastinal contours are within normal limits. A chronic tenth left rib fracture is seen. The visualized skeletal structures are otherwise unremarkable. IMPRESSION: No active disease. Electronically Signed   By: Virgina Norfolk M.D.   On: 06/05/2020 16:07    Cherene Altes, MD [facility] Office  [phone] Pager - Text Page per Amion as per below:  On-Call/Text Page:      [email or web address]  If 7PM-7AM, please contact night-coverage [email or web address] 06/05/2020, 5:47 PM

## 2020-06-05 NOTE — Progress Notes (Signed)
These results were reviewed with Dr. Mohamed and with the patient.

## 2020-06-05 NOTE — Telephone Encounter (Signed)
CRITICAL VALUE STICKER  CRITICAL VALUE: ANC=0.1  RECEIVER (on-site recipient of call):Evalisse Prajapati  DATE & TIME NOTIFIED: 06/05/20 1310  MESSENGER (representative from lab):lab MD NOTIFIED: Mohamed/Van Tanner  TIME OF NOTIFICATION:06/05/20 1315  RESPONSE: Pt seeing Lucianne Lei today

## 2020-06-05 NOTE — ED Provider Notes (Signed)
[facility] DEPT Provider Note   CSN: [id number] Arrival date & time: 06/05/20  1444     History Chief Complaint  Patient presents with  . Fatigue    Tony Marshall is a 79 y.o. male.  HPI  LEVEL 5 CAVEAT 2/2 TO AMS/ DEMENTIA 79 year old male with a history of DM type II, hypertension, CLL presents to the ER for 3-day increasing confusion.  History provided by his wife who is at bedside.  Patient wife states that over the last few days she has noticed that he has becoming increasingly confused, and is having difficulty focusing on TV.  Normally the patient she states is alert and oriented, but over the last few days he has not even recognized her.  He has been struggling with taking food and drink by mouth over the last month.  Per chart review of previous notes, patient was seen in the ED with similar complaints, and was found to have oral thrush.  He was started on an antifungal and sent to the GI doctor.  Patient's wife states that the were not able to get an appointment until August.  She states that his increasing weakness is concerning, and she is afraid that he might have a fall soon.  He is not on any anticoagulation.  She states that he has not been taking any of his medications which includes his blood pressure and insulin.  She has not noticed any one-sided weakness.  No fevers, chills, cough.  He has not complained of any pain with urination.  No nausea, vomiting, abdominal pain.    Past Medical History:  Diagnosis Date  . CLL (chronic lymphocytic leukemia) ([place]) dx'd 2014   progression 12/2019  . Diabetes mellitus without complication ([place])   . Diverticulosis   . External hemorrhoids   . GERD (gastroesophageal reflux disease)   . Hiatal hernia   . History of colon polyps   . Hyperlipidemia   . Hypertension   . Internal hemorrhoids   . Lymphocytosis   . Schatzki's ring   . Sleep apnea     Patient Active Problem List   Diagnosis Date  Noted  . Goals of care, counseling/discussion 03/28/2020  . Encounter for antineoplastic chemotherapy 03/28/2020  . GERD (gastroesophageal reflux disease) 05/13/2014  . History of colonic polyps 05/13/2014  . Hiatal hernia 05/13/2014  . CLL (chronic lymphocytic leukemia) ([place]) 10/25/2013  . ED (erectile dysfunction) 10/20/2013  . Lymphocytosis 09/23/2013  . Essential hypertension 07/21/2013  . Pure hypercholesterolemia 07/21/2013  . Diabetes mellitus ([place]) 07/20/2013    Past Surgical History:  Procedure Laterality Date  . BLEPHAROPLASTY    . COLONOSCOPY  2012  . HEMORRHOID SURGERY     Sclerotherapy  . KNEE SURGERY Right 2002  . POLYPECTOMY    . SHOULDER SURGERY Right 2007       Family History  Problem Relation Age of Onset  . Kidney disease Mother   . CAD Maternal Aunt   . Colon cancer Neg Hx   . Esophageal cancer Neg Hx   . Rectal cancer Neg Hx   . Stomach cancer Neg Hx     Social History   Tobacco Use  . Smoking status: Never Smoker  . Smokeless tobacco: Former Systems developer    Types: Secondary school teacher  . Vaping Use: Never used  Substance Use Topics  . Alcohol use: Yes    Alcohol/week: 6.0 standard drinks    Types: 6 Cans of beer per week  .  Drug use: No    Home Medications Prior to Admission medications   Medication Sig Start Date End Date Taking? Authorizing Provider  acetaminophen (TYLENOL) 500 MG tablet Take 500 mg by mouth every 6 (six) hours as needed for moderate pain.   Yes [provider]  allopurinol (ZYLOPRIM) 100 MG tablet Take 1 tablet (100 mg total) by mouth 2 (two) times daily. 03/28/20  Yes Curt Bears, MD  amLODipine (NORVASC) 5 MG tablet Take 1 tablet (5 mg total) by mouth daily. 03/27/20  Yes Elayne Snare, MD  aspirin 81 MG EC tablet Take 81 mg by mouth daily. Swallow whole.   Yes [provider]  benazepril (LOTENSIN) 20 MG tablet TAKE 1 TABLET BY MOUTH EVERY DAY 03/21/20  Yes Elayne Snare, MD  carvedilol (COREG) 25 MG tablet  TAKE ONE TABLET BY MOUTH TWICE DAILY  02/10/20  Yes Elayne Snare, MD  doxazosin (CARDURA) 4 MG tablet Take 1/2 tablet (2mg  total) by mouth once daily. If systolic BP is below 226, stop taking. 05/22/20  Yes Elayne Snare, MD  dronabinol (MARINOL) 2.5 MG capsule Take 1 capsule (2.5 mg total) by mouth 2 (two) times daily before a meal. 05/30/20  Yes Heilingoetter, Cassandra L, PA-C  esomeprazole (NEXIUM) 40 MG capsule TAKE 1 CAPSULE BY MOUTH EVERY MORNING 30 MINUTES BEFORE BREAKFAST Patient taking differently: Take 40 mg by mouth daily.  04/28/20  Yes Pyrtle, Lajuan Lines, MD  glucagon (GLUCAGON EMERGENCY) 1 MG injection Inject 1 mg into the skin once as needed (For severe low blood sugar). 06/14/19  Yes Elayne Snare, MD  ibuprofen (ADVIL) 200 MG tablet Take 200 mg by mouth every 6 (six) hours as needed for headache or mild pain.   Yes [provider]  insulin aspart (NOVOLOG FLEXPEN) 100 UNIT/ML FlexPen Inject 3-4 Units into the skin 3 (three) times daily with meals. Inject 3-4 units under the skin three times daily before meals.    Yes [provider]  insulin degludec (TRESIBA FLEXTOUCH) 100 UNIT/ML SOPN FlexTouch Pen inject 12 units daily. if fasting blood sugars are over 140 increase dosage up to 15 units daily as directed. 03/22/19  Yes Elayne Snare, MD  loperamide (IMODIUM A-D) 2 MG tablet Take 2 mg by mouth 4 (four) times daily as needed for diarrhea or loose stools.   Yes [provider]  metFORMIN (GLUCOPHAGE-XR) 500 MG 24 hr tablet Take 1 tablet (500 mg total) by mouth daily with supper. 09/16/19  Yes Elayne Snare, MD  oxymetazoline (AFRIN) 0.05 % nasal spray Place 1 spray into both nostrils 2 (two) times daily as needed for congestion.   Yes [provider]  pioglitazone (ACTOS) 15 MG tablet TAKE 1 TABLET BY MOUTH EVERY DAY 03/21/20  Yes Elayne Snare, MD  potassium chloride SA (KLOR-CON) 20 MEQ tablet Take 1 tablet (20 mEq total) by mouth 2 (two) times daily. 05/12/20  Yes Tanner,  Lyndon Code., PA-C  prochlorperazine (COMPAZINE) 10 MG tablet Take 1 tablet (10 mg total) by mouth every 6 (six) hours as needed for nausea or vomiting. 03/28/20  Yes Curt Bears, MD  rosuvastatin (CRESTOR) 5 MG tablet TAKE 1 TABLET BY MOUTH EVERY DAY 04/21/20  Yes Elayne Snare, MD  ferrous sulfate 325 (65 FE) MG EC tablet TAKE 1 TABLET BY MOUTH 3 TIMES A DAY WITH MEALS Patient not taking: No sig reported 12/28/19   Curt Bears, MD  fluconazole (DIFLUCAN) 200 MG tablet Take 1 tablet (200 mg total) by mouth daily. Patient not  taking: Reported on 05/30/2020 05/12/20   Harle Stanford., PA-C  levofloxacin (LEVAQUIN) 500 MG tablet Take 1 tablet (500 mg total) by mouth daily. Begin on Saturday, May 29. Patient not taking: Reported on 05/30/2020 05/12/20   Harle Stanford., PA-C  [facility] VERIO test strip USE 1 STRIP TO CHECK GLUCOSE THREE TIMES DAILY 02/24/20   Elayne Snare, MD  sildenafil (REVATIO) 20 MG tablet TAKE 5 TABLETS BY MOUTH AS NEEDED once a week. Patient not taking: Reported on 05/30/2020 04/16/16   Elayne Snare, MD    Allergies    Glycopyrrolate  Review of Systems   Review of Systems  Unable to perform ROS: Mental status change    Physical Exam Updated Vital Signs BP (!) 135/57   Pulse (!) 106   Temp 97.8 F (36.6 C) (Oral)   Resp (!) 21   Ht 5' 8.5" (1.74 m)   Wt 54.9 kg   SpO2 100%   BMI 18.13 kg/m   Physical Exam Vitals and nursing note reviewed.  Constitutional:      General: He is not in acute distress.    Appearance: Normal appearance. He is well-developed. He is not ill-appearing (Chronically ill-appearing), toxic-appearing or diaphoretic.  HENT:     Head: Normocephalic and atraumatic.     Nose: Nose normal.     Mouth/Throat:     Mouth: Mucous membranes are moist.  Eyes:     Conjunctiva/sclera: Conjunctivae normal.     Pupils: Pupils are equal, round, and reactive to light.  Cardiovascular:     Rate and Rhythm: Normal rate and regular rhythm.     Pulses: Normal  pulses.     Heart sounds: Normal heart sounds. No murmur heard.   Pulmonary:     Effort: Pulmonary effort is normal. No respiratory distress.     Breath sounds: Normal breath sounds.  Abdominal:     General: Abdomen is flat.     Palpations: Abdomen is soft.     Tenderness: There is no abdominal tenderness.  Musculoskeletal:        General: No swelling or tenderness. Normal range of motion.     Cervical back: Normal range of motion and neck supple.  Skin:    General: Skin is warm and dry.     Capillary Refill: Capillary refill takes less than 2 seconds.     Findings: No bruising, erythema or rash.  Neurological:     General: No focal deficit present.     Mental Status: He is alert and oriented to person, place, and time.     Sensory: No sensory deficit.     Motor: No weakness.     Comments: Mental Status:  Alert and oriented x3, follows commands.  However is not able to provide much about his history, states he does not know why he is in the emergency room.  Speech effluent with aphasia.  No facial droop noted.  Follows 2 step commands without difficulty.  Cranial Nerves:  II: Peripheral visual fields grossly normal, pupils equal, round, reactive to light III,IV, VI: ptosis not present, extra-ocular motions intact bilaterally  V,VII: smile symmetric, facial light touch sensation equal VIII: hearing grossly normal to voice  X: uvula elevates symmetrically  XI: bilateral shoulder shrug symmetric and strong XII: midline tongue extension without fassiculations Motor:  Normal tone. 5/5 strength of BUE and BLE major muscle groups including strong and equal grip strength and dorsiflexion/plantar flexion Sensory: light touch normal in all extremities. Cerebellar: normal finger-to-nose with  bilateral upper extremities, Romberg sign absent Gait: not accessed       ED Results / Procedures / Treatments   Labs (all labs ordered are listed, but only abnormal results are displayed) Labs  Reviewed  COMPREHENSIVE METABOLIC PANEL - Abnormal; Notable for the following components:      Result Value   Sodium 131 (*)    Potassium 3.3 (*)    Chloride 89 (*)    CO2 21 (*)    Glucose, Bld 342 (*)    Calcium 8.3 (*)    Total Protein 5.7 (*)    Albumin 3.3 (*)    Total Bilirubin 2.6 (*)    Anion gap 21 (*)    All other components within normal limits  CBC WITH DIFFERENTIAL/PLATELET - Abnormal; Notable for the following components:   WBC 2.4 (*)    RBC 2.84 (*)    Hemoglobin 9.4 (*)    HCT 27.7 (*)    RDW 16.8 (*)    Neutro Abs 0.1 (*)    All other components within normal limits  URINALYSIS, ROUTINE W REFLEX MICROSCOPIC - Abnormal; Notable for the following components:   Glucose, UA >=500 (*)    Ketones, ur 80 (*)    All other components within normal limits  BLOOD GAS, VENOUS - Abnormal; Notable for the following components:   pCO2, Ven 33.1 (*)    pO2, Ven <31.0 (*)    Acid-base deficit 2.3 (*)    All other components within normal limits  CBG MONITORING, ED - Abnormal; Notable for the following components:   Glucose-Capillary 336 (*)    All other components within normal limits  CBG MONITORING, ED - Abnormal; Notable for the following components:   Glucose-Capillary 322 (*)    All other components within normal limits  SARS CORONAVIRUS 2 BY RT PCR (HOSPITAL ORDER, [facility])  TROPONIN I (HIGH SENSITIVITY)    EKG None  Radiology CT Head Wo Contrast  Result Date: 06/05/2020 CLINICAL DATA:  Altered mental status fatigue and weakness EXAM: CT HEAD WITHOUT CONTRAST TECHNIQUE: Contiguous axial images were obtained from the base of the skull through the vertex without intravenous contrast. COMPARISON:  MRI 11/07/2014 FINDINGS: Brain: No acute territorial infarction, hemorrhage, or intracranial mass. Mild to moderate atrophy. Mild hypodensity in the white matter consistent with chronic small vessel ischemic change. Prominent ventricle size,  likely due to atrophy. Vascular: No hyperdense vessels.  No unexpected calcification Skull: Motion artifact.  No gross fracture Sinuses/Orbits: No acute finding. Other: None IMPRESSION: 1. No CT evidence for acute intracranial abnormality. 2. Atrophy and mild chronic small vessel ischemic changes of the white matter. Electronically Signed   By: Donavan Foil M.D.   On: 06/05/2020 17:29   DG Chest Portable 1 View  Result Date: 06/05/2020 CLINICAL DATA:  Fatigue and weakness. EXAM: PORTABLE CHEST 1 VIEW COMPARISON:  May 16, 2020 FINDINGS: The study is limited secondary to patient rotation. There is no evidence of acute infiltrate, pleural effusion or pneumothorax. The heart size and mediastinal contours are within normal limits. A chronic tenth left rib fracture is seen. The visualized skeletal structures are otherwise unremarkable. IMPRESSION: No active disease. Electronically Signed   By: Virgina Norfolk M.D.   On: 06/05/2020 16:07    Procedures Procedures (including critical care time)  Medications Ordered in ED Medications  insulin regular, human (MYXREDLIN) 100 units/ 100 mL infusion (11 Units/hr Intravenous New Bag/Given 06/05/20 1722)  0.9 %  sodium chloride  infusion ( Intravenous New Bag/Given 06/05/20 1719)  dextrose 5 %-0.45 % sodium chloride infusion ( Intravenous Not Given 06/05/20 1650)  dextrose 50 % solution 0-50 mL (has no administration in time range)  sodium chloride 0.9 % bolus 1,000 mL (1,000 mLs Intravenous New Bag/Given 06/05/20 1620)  LORazepam (ATIVAN) injection 1 mg (1 mg Intravenous Given 06/05/20 1751)    ED Course  I have reviewed the triage vital signs and the nursing notes.  Pertinent labs & imaging results that were available during my care of the patient were reviewed by me and considered in my medical decision making (see chart for details).    MDM Rules/Calculators/A&P                         79 year old with gradual weakness over the last 3 days. On  presentation, the patient is alert and oriented x3, follows commands but is not able to tell me much about his history why he is here in the ER.  History provided by the wife at bedside.  Physical exam without any neurologic abnormalities, was unable to assess gait.  Abdomen soft and nontender.  He is overall well-appearing and alert.  Vitals overall reassuring, he is not tachypneic on my exam, afebrile, pulse and blood pressure and O2 sats are reassuring.  CMP with mild hyponatremia of 131, potassium 3.3, which appear to be largely unchanged from previous chart review.  CO2 today of 21, glucose 342, anion gap 21.  Calcium of 8.3.  CBC with leukopenia of 2.4, hemoglobin 9.4.  WBC 6 days ago was around 4.  Hemoglobin appears to be stable.  UA with glucose of more than 500 and 80 ketones.  VBG with normal pH of 7.422, PCO2 33.1, most notably critical value of PO2 of<31.  Patient started on insulin drip, given fluids.  Consulted hospitalist Dr. Thereasa Solo who will admit the patient for further management.  Patient will likely require assistance with transition of care after hospital stay.  Patient has remained hemodynamically stable throughout the ED course.  Final Clinical Impression(s) / ED Diagnoses Final diagnoses:  Hyperglycemia due to diabetes mellitus (HCC)  Increased anion gap metabolic acidosis  Leukopenia, unspecified type  Failure to thrive in adult  Altered mental status, unspecified altered mental status type    Rx / DC Orders ED Discharge Orders    None       Lyndel Safe 06/05/20 1814    Milton Ferguson, MD 06/05/20 2316

## 2020-06-05 NOTE — ED Notes (Signed)
Date and time results received: 06/05/20 9:16 PM  (use smartphrase ".now" to insert current time)  Test: Potassium Critical Value: 2.7  Name of Provider Notified: Dr. Hal Hope  Orders Received? Or Actions Taken?:

## 2020-06-05 NOTE — ED Triage Notes (Signed)
Arrives from the [facility], C/C general fatigue and weakness since last treatment last week. Has not been taking diabetic medication and antihypertensives. CBG was in the 300s and anion gap was 20.

## 2020-06-06 ENCOUNTER — Encounter (HOSPITAL_COMMUNITY): Payer: Self-pay | Admitting: Internal Medicine

## 2020-06-06 ENCOUNTER — Inpatient Hospital Stay: Payer: Medicare HMO

## 2020-06-06 ENCOUNTER — Inpatient Hospital Stay (HOSPITAL_COMMUNITY): Payer: Medicare HMO

## 2020-06-06 DIAGNOSIS — C9110 Chronic lymphocytic leukemia of B-cell type not having achieved remission: Secondary | ICD-10-CM

## 2020-06-06 DIAGNOSIS — D6481 Anemia due to antineoplastic chemotherapy: Secondary | ICD-10-CM

## 2020-06-06 DIAGNOSIS — E872 Acidosis: Secondary | ICD-10-CM

## 2020-06-06 DIAGNOSIS — E1110 Type 2 diabetes mellitus with ketoacidosis without coma: Secondary | ICD-10-CM

## 2020-06-06 DIAGNOSIS — D701 Agranulocytosis secondary to cancer chemotherapy: Secondary | ICD-10-CM

## 2020-06-06 DIAGNOSIS — E876 Hypokalemia: Secondary | ICD-10-CM

## 2020-06-06 LAB — BLOOD GAS, ARTERIAL
Acid-Base Excess: 1 mmol/L (ref 0.0–2.0)
Bicarbonate: 22.7 mmol/L (ref 20.0–28.0)
O2 Saturation: 100 %
Patient temperature: 98.6
pCO2 arterial: 25.2 mmHg — ABNORMAL LOW (ref 32.0–48.0)
pH, Arterial: 7.563 — ABNORMAL HIGH (ref 7.350–7.450)
pO2, Arterial: 278 mmHg — ABNORMAL HIGH (ref 83.0–108.0)

## 2020-06-06 LAB — COMPREHENSIVE METABOLIC PANEL
ALT: 12 U/L (ref 0–44)
ALT: 14 U/L (ref 0–44)
ALT: 14 U/L (ref 0–44)
AST: 26 U/L (ref 15–41)
AST: 39 U/L (ref 15–41)
AST: 36 U/L (ref 15–41)
Albumin: 2.7 g/dL — ABNORMAL LOW (ref 3.5–5.0)
Albumin: 2.5 g/dL — ABNORMAL LOW (ref 3.5–5.0)
Albumin: 2 g/dL — ABNORMAL LOW (ref 3.5–5.0)
Alkaline Phosphatase: 39 U/L (ref 38–126)
Alkaline Phosphatase: 41 U/L (ref 38–126)
Alkaline Phosphatase: 37 U/L — ABNORMAL LOW (ref 38–126)
Anion gap: 12 (ref 5–15)
Anion gap: 10 (ref 5–15)
Anion gap: 9 (ref 5–15)
BUN: 16 mg/dL (ref 8–23)
BUN: 19 mg/dL (ref 8–23)
BUN: 23 mg/dL (ref 8–23)
CO2: 23 mmol/L (ref 22–32)
CO2: 24 mmol/L (ref 22–32)
CO2: 22 mmol/L (ref 22–32)
Calcium: 7.8 mg/dL — ABNORMAL LOW (ref 8.9–10.3)
Calcium: 7.8 mg/dL — ABNORMAL LOW (ref 8.9–10.3)
Calcium: 7.1 mg/dL — ABNORMAL LOW (ref 8.9–10.3)
Chloride: 99 mmol/L (ref 98–111)
Chloride: 100 mmol/L (ref 98–111)
Chloride: 102 mmol/L (ref 98–111)
Creatinine, Ser: 0.84 mg/dL (ref 0.61–1.24)
Creatinine, Ser: 0.98 mg/dL (ref 0.61–1.24)
Creatinine, Ser: 1.09 mg/dL (ref 0.61–1.24)
GFR calc Af Amer: >60 mL/min (ref >60)
GFR calc Af Amer: >60 mL/min (ref >60)
GFR calc Af Amer: >60 mL/min (ref >60)
GFR calc non Af Amer: >60 mL/min (ref >60)
GFR calc non Af Amer: >60 mL/min (ref >60)
GFR calc non Af Amer: >60 mL/min (ref >60)
Glucose, Bld: 134 mg/dL — ABNORMAL HIGH (ref 70–99)
Glucose, Bld: 65 mg/dL — ABNORMAL LOW (ref 70–99)
Glucose, Bld: 114 mg/dL — ABNORMAL HIGH (ref 70–99)
Potassium: 2.6 mmol/L — CL (ref 3.5–5.1)
Potassium: 3.4 mmol/L — ABNORMAL LOW (ref 3.5–5.1)
Potassium: 2.8 mmol/L — ABNORMAL LOW (ref 3.5–5.1)
Sodium: 134 mmol/L — ABNORMAL LOW (ref 135–145)
Sodium: 134 mmol/L — ABNORMAL LOW (ref 135–145)
Sodium: 133 mmol/L — ABNORMAL LOW (ref 135–145)
Total Bilirubin: 1.7 mg/dL — ABNORMAL HIGH (ref 0.3–1.2)
Total Bilirubin: 2 mg/dL — ABNORMAL HIGH (ref 0.3–1.2)
Total Bilirubin: 1.7 mg/dL — ABNORMAL HIGH (ref 0.3–1.2)
Total Protein: 4.6 g/dL — ABNORMAL LOW (ref 6.5–8.1)
Total Protein: 4.6 g/dL — ABNORMAL LOW (ref 6.5–8.1)
Total Protein: 3.9 g/dL — ABNORMAL LOW (ref 6.5–8.1)

## 2020-06-06 LAB — RETICULOCYTES
Immature Retic Fract: 8.9 % (ref 2.3–15.9)
RBC.: 2.17 MIL/uL — ABNORMAL LOW (ref 4.22–5.81)
Retic Count, Absolute: 31.5 10*3/uL (ref 19.0–186.0)
Retic Ct Pct: 1.5 % (ref 0.4–3.1)

## 2020-06-06 LAB — CBC
HCT: 22.8 % — ABNORMAL LOW (ref 39.0–52.0)
HCT: 21.5 % — ABNORMAL LOW (ref 39.0–52.0)
Hemoglobin: 7.6 g/dL — ABNORMAL LOW (ref 13.0–17.0)
Hemoglobin: 7.3 g/dL — ABNORMAL LOW (ref 13.0–17.0)
MCH: 31.8 pg (ref 26.0–34.0)
MCH: 32.9 pg (ref 26.0–34.0)
MCHC: 33.3 g/dL (ref 30.0–36.0)
MCHC: 34 g/dL (ref 30.0–36.0)
MCV: 95.4 fL (ref 80.0–100.0)
MCV: 96.8 fL (ref 80.0–100.0)
Platelets: 179 10*3/uL (ref 150–400)
Platelets: 162 10*3/uL (ref 150–400)
RBC: 2.39 MIL/uL — ABNORMAL LOW (ref 4.22–5.81)
RBC: 2.22 MIL/uL — ABNORMAL LOW (ref 4.22–5.81)
RDW: 16.8 % — ABNORMAL HIGH (ref 11.5–15.5)
RDW: 17.1 % — ABNORMAL HIGH (ref 11.5–15.5)
WBC: 1.4 10*3/uL — CL (ref 4.0–10.5)
WBC: 1.3 10*3/uL — CL (ref 4.0–10.5)
nRBC: 0 % (ref 0.0–0.2)
nRBC: 0 % (ref 0.0–0.2)

## 2020-06-06 LAB — GLUCOSE, CAPILLARY
Glucose-Capillary: 79 mg/dL (ref 70–99)
Glucose-Capillary: 112 mg/dL — ABNORMAL HIGH (ref 70–99)

## 2020-06-06 LAB — BASIC METABOLIC PANEL
Anion gap: 11 (ref 5–15)
BUN: 17 mg/dL (ref 8–23)
CO2: 25 mmol/L (ref 22–32)
Calcium: 7.9 mg/dL — ABNORMAL LOW (ref 8.9–10.3)
Chloride: 99 mmol/L (ref 98–111)
Creatinine, Ser: 0.78 mg/dL (ref 0.61–1.24)
GFR calc Af Amer: >60 mL/min (ref >60)
GFR calc non Af Amer: >60 mL/min (ref >60)
Glucose, Bld: 115 mg/dL — ABNORMAL HIGH (ref 70–99)
Potassium: 2.7 mmol/L — CL (ref 3.5–5.1)
Sodium: 135 mmol/L (ref 135–145)

## 2020-06-06 LAB — CBC WITH DIFFERENTIAL/PLATELET
Abs Immature Granulocytes: 0.07 10*3/uL (ref 0.00–0.07)
Basophils Absolute: 0 10*3/uL (ref 0.0–0.1)
Basophils Relative: 1 %
Eosinophils Absolute: 0 10*3/uL (ref 0.0–0.5)
Eosinophils Relative: 0 %
HCT: 23.2 % — ABNORMAL LOW (ref 39.0–52.0)
Hemoglobin: 7.7 g/dL — ABNORMAL LOW (ref 13.0–17.0)
Immature Granulocytes: 3 %
Lymphocytes Relative: 79 %
Lymphs Abs: 1.6 10*3/uL (ref 0.7–4.0)
MCH: 32.8 pg (ref 26.0–34.0)
MCHC: 33.2 g/dL (ref 30.0–36.0)
MCV: 98.7 fL (ref 80.0–100.0)
Monocytes Absolute: 0.2 10*3/uL (ref 0.1–1.0)
Monocytes Relative: 9 %
Neutro Abs: 0.2 10*3/uL — ABNORMAL LOW (ref 1.7–7.7)
Neutrophils Relative %: 8 %
Platelets: 163 10*3/uL (ref 150–400)
RBC: 2.35 MIL/uL — ABNORMAL LOW (ref 4.22–5.81)
RDW: 16.9 % — ABNORMAL HIGH (ref 11.5–15.5)
WBC: 2.1 10*3/uL — ABNORMAL LOW (ref 4.0–10.5)
nRBC: 0 % (ref 0.0–0.2)

## 2020-06-06 LAB — AMMONIA: Ammonia: 20 umol/L (ref 9–35)

## 2020-06-06 LAB — VITAMIN B12: Vitamin B-12: 675 pg/mL (ref 180–914)

## 2020-06-06 LAB — IRON AND TIBC
Iron: 15 ug/dL — ABNORMAL LOW (ref 45–182)
Saturation Ratios: 11 % — ABNORMAL LOW (ref 17.9–39.5)
TIBC: 134 ug/dL — ABNORMAL LOW (ref 250–450)
UIBC: 119 ug/dL

## 2020-06-06 LAB — CBG MONITORING, ED
Glucose-Capillary: 108 mg/dL — ABNORMAL HIGH (ref 70–99)
Glucose-Capillary: 99 mg/dL (ref 70–99)
Glucose-Capillary: 107 mg/dL — ABNORMAL HIGH (ref 70–99)
Glucose-Capillary: 133 mg/dL — ABNORMAL HIGH (ref 70–99)
Glucose-Capillary: 128 mg/dL — ABNORMAL HIGH (ref 70–99)

## 2020-06-06 LAB — MAGNESIUM
Magnesium: 1.5 mg/dL — ABNORMAL LOW (ref 1.7–2.4)
Magnesium: 2.1 mg/dL (ref 1.7–2.4)

## 2020-06-06 LAB — TSH: TSH: 0.2 u[IU]/mL — ABNORMAL LOW (ref 0.350–4.500)

## 2020-06-06 LAB — FERRITIN: Ferritin: 2175 ng/mL — ABNORMAL HIGH (ref 24–336)

## 2020-06-06 LAB — PREPARE RBC (CROSSMATCH)

## 2020-06-06 LAB — RPR: RPR Ser Ql: NONREACTIVE

## 2020-06-06 LAB — LACTIC ACID, PLASMA
Lactic Acid, Venous: 3.5 mmol/L (ref 0.5–1.9)
Lactic Acid, Venous: 3.3 mmol/L (ref 0.5–1.9)

## 2020-06-06 LAB — MRSA PCR SCREENING: MRSA by PCR: NEGATIVE

## 2020-06-06 LAB — FOLATE: Folate: 10.1 ng/mL (ref >5.9)

## 2020-06-06 MED ORDER — POTASSIUM CHLORIDE IN NACL 40-0.9 MEQ/L-% IV SOLN
INTRAVENOUS | Status: DC
  Filled 2020-06-06: qty 1000

## 2020-06-06 MED ORDER — VANCOMYCIN HCL 500 MG/100ML IV SOLN
500.0000 mg | Freq: Two times a day (BID) | INTRAVENOUS | Status: DC
  Administered 2020-06-07 – 2020-06-10 (×7): 500 mg via INTRAVENOUS
  Filled 2020-06-06 (×8): qty 100

## 2020-06-06 MED ORDER — SODIUM CHLORIDE 0.9 % IV BOLUS
1000.0000 mL | Freq: Once | INTRAVENOUS | Status: AC
  Administered 2020-06-06: 1000 mL via INTRAVENOUS

## 2020-06-06 MED ORDER — KETOROLAC TROMETHAMINE 15 MG/ML IJ SOLN
7.5000 mg | Freq: Once | INTRAMUSCULAR | Status: AC
  Administered 2020-06-06: 7.5 mg via INTRAVENOUS
  Filled 2020-06-06: qty 1

## 2020-06-06 MED ORDER — LACTATED RINGERS IV BOLUS
1000.0000 mL | Freq: Once | INTRAVENOUS | Status: AC
  Administered 2020-06-06: 1000 mL via INTRAVENOUS

## 2020-06-06 MED ORDER — NALOXONE HCL 0.4 MG/ML IJ SOLN
INTRAMUSCULAR | Status: AC
  Filled 2020-06-06: qty 1

## 2020-06-06 MED ORDER — SODIUM CHLORIDE 0.9 % IV SOLN
2.0000 g | Freq: Two times a day (BID) | INTRAVENOUS | Status: DC
  Administered 2020-06-06 – 2020-06-10 (×8): 2 g via INTRAVENOUS
  Filled 2020-06-06 (×7): qty 2

## 2020-06-06 MED ORDER — POTASSIUM CHLORIDE 10 MEQ/100ML IV SOLN
10.0000 meq | INTRAVENOUS | Status: AC
  Administered 2020-06-06 – 2020-06-07 (×6): 10 meq via INTRAVENOUS
  Filled 2020-06-06 (×6): qty 100

## 2020-06-06 MED ORDER — KATE FARMS STANDARD 1.4 PO LIQD
325.0000 mL | Freq: Two times a day (BID) | ORAL | Status: DC
  Administered 2020-06-08 – 2020-06-17 (×15): 325 mL via ORAL
  Filled 2020-06-06 (×22): qty 325

## 2020-06-06 MED ORDER — INSULIN ASPART 100 UNIT/ML ~~LOC~~ SOLN
0.0000 [IU] | Freq: Three times a day (TID) | SUBCUTANEOUS | Status: DC
  Administered 2020-06-07 – 2020-06-08 (×2): 2 [IU] via SUBCUTANEOUS
  Administered 2020-06-08 – 2020-06-09 (×2): 1 [IU] via SUBCUTANEOUS
  Administered 2020-06-09 – 2020-06-10 (×2): 2 [IU] via SUBCUTANEOUS
  Administered 2020-06-10: 1 [IU] via SUBCUTANEOUS
  Administered 2020-06-11 – 2020-06-12 (×3): 2 [IU] via SUBCUTANEOUS
  Administered 2020-06-12 (×2): 3 [IU] via SUBCUTANEOUS
  Administered 2020-06-13 (×2): 2 [IU] via SUBCUTANEOUS
  Administered 2020-06-14 – 2020-06-15 (×4): 3 [IU] via SUBCUTANEOUS
  Administered 2020-06-16: 7 [IU] via SUBCUTANEOUS
  Administered 2020-06-16: 3 [IU] via SUBCUTANEOUS
  Administered 2020-06-16: 1 [IU] via SUBCUTANEOUS
  Administered 2020-06-17: 2 [IU] via SUBCUTANEOUS
  Administered 2020-06-17: 1 [IU] via SUBCUTANEOUS
  Administered 2020-06-18: 3 [IU] via SUBCUTANEOUS
  Administered 2020-06-18: 2 [IU] via SUBCUTANEOUS
  Administered 2020-06-19: 3 [IU] via SUBCUTANEOUS
  Administered 2020-06-19: 1 [IU] via SUBCUTANEOUS
  Administered 2020-06-19: 2 [IU] via SUBCUTANEOUS
  Administered 2020-06-20: 3 [IU] via SUBCUTANEOUS
  Administered 2020-06-20: 5 [IU] via SUBCUTANEOUS
  Filled 2020-06-06: qty 0.09

## 2020-06-06 MED ORDER — ACETAMINOPHEN 325 MG RE SUPP
325.0000 mg | RECTAL | Status: AC
  Administered 2020-06-06: 325 mg via RECTAL
  Filled 2020-06-06: qty 1

## 2020-06-06 MED ORDER — VANCOMYCIN HCL IN DEXTROSE 1-5 GM/200ML-% IV SOLN
1000.0000 mg | Freq: Once | INTRAVENOUS | Status: AC
  Administered 2020-06-06: 1000 mg via INTRAVENOUS
  Filled 2020-06-06: qty 200

## 2020-06-06 MED ORDER — DIPHENHYDRAMINE HCL 25 MG PO CAPS: 25.0000 mg | ORAL_CAPSULE | Freq: Once | ORAL | Status: DC

## 2020-06-06 MED ORDER — TBO-FILGRASTIM 300 MCG/0.5ML ~~LOC~~ SOSY
300.0000 ug | PREFILLED_SYRINGE | Freq: Every day | SUBCUTANEOUS | Status: AC
  Administered 2020-06-06 – 2020-06-08 (×3): 300 ug via SUBCUTANEOUS
  Filled 2020-06-06 (×3): qty 0.5

## 2020-06-06 MED ORDER — BENEPROTEIN PO POWD
2.0000 | Freq: Three times a day (TID) | ORAL | Status: DC
  Filled 2020-06-06: qty 227

## 2020-06-06 MED ORDER — MAGNESIUM SULFATE 2 GM/50ML IV SOLN
2.0000 g | Freq: Once | INTRAVENOUS | Status: AC
  Administered 2020-06-06: 2 g via INTRAVENOUS
  Filled 2020-06-06: qty 50

## 2020-06-06 MED ORDER — LACTATED RINGERS IV SOLN: INTRAVENOUS | Status: DC

## 2020-06-06 MED ORDER — NALOXONE HCL 0.4 MG/ML IJ SOLN
0.4000 mg | INTRAMUSCULAR | Status: DC | PRN
  Administered 2020-06-06: 0.4 mg via INTRAVENOUS

## 2020-06-06 MED ORDER — SODIUM CHLORIDE 0.9% IV SOLUTION: Freq: Once | INTRAVENOUS | Status: DC

## 2020-06-06 MED ORDER — CHLORHEXIDINE GLUCONATE CLOTH 2 % EX PADS
6.0000 | MEDICATED_PAD | Freq: Every day | CUTANEOUS | Status: DC
  Administered 2020-06-06 – 2020-06-20 (×15): 6 via TOPICAL

## 2020-06-06 MED ORDER — ACETAMINOPHEN 325 MG PO TABS
650.0000 mg | ORAL_TABLET | Freq: Once | ORAL | Status: DC
  Filled 2020-06-06: qty 2

## 2020-06-06 MED ORDER — SODIUM CHLORIDE 0.9 % IV SOLN: INTRAVENOUS | Status: DC

## 2020-06-06 MED ORDER — INSULIN GLARGINE 100 UNIT/ML ~~LOC~~ SOLN
10.0000 [IU] | Freq: Every day | SUBCUTANEOUS | Status: DC
  Administered 2020-06-06: 10 [IU] via SUBCUTANEOUS
  Filled 2020-06-06 (×2): qty 0.1

## 2020-06-06 MED ORDER — RESOURCE INSTANT PROTEIN PO PWD PACKET
12.0000 g | Freq: Three times a day (TID) | ORAL | Status: DC
  Administered 2020-06-08 – 2020-06-20 (×20): 12 g via ORAL
  Filled 2020-06-06 (×43): qty 12

## 2020-06-06 MED ORDER — ACETAMINOPHEN 10 MG/ML IV SOLN
1000.0000 mg | Freq: Four times a day (QID) | INTRAVENOUS | Status: AC
  Administered 2020-06-06 – 2020-06-07 (×4): 1000 mg via INTRAVENOUS
  Filled 2020-06-06 (×4): qty 100

## 2020-06-06 MED ORDER — ADULT MULTIVITAMIN W/MINERALS CH
1.0000 | ORAL_TABLET | Freq: Every day | ORAL | Status: DC
  Administered 2020-06-08 – 2020-06-20 (×13): 1 via ORAL
  Filled 2020-06-06 (×12): qty 1

## 2020-06-06 NOTE — ED Notes (Addendum)
Hospitalist updated on patients anion gap and insulin drip. States to continue with insulin drip an additional 2 hours after lantus administration. Will continue to monitor.

## 2020-06-06 NOTE — Progress Notes (Signed)
Pharmacy Antibiotic Note  Tony Marshall is a 79 y.o. male admitted on 06/05/2020 with history of DM2, diverticulosis, GERD, HLD, HTN, and CLL undergoing active treatment with bendamustine and Rituxan complicated by recurrent episodes of fatigue, generalized weakness, weight loss, lightheadedness, and poor appetite  Pharmacy has been consulted for cefepime and vancomycin dosing.  Plan: Vancomycin 1gm IV x 1 then 500mg  q12h, trough goal 15-20 Cefepime 2gm IV q12h Follow renal function, cultures and clinical course  Height: 5' 8.5" (174 cm) Weight: 54.9 kg (121 lb) IBW/kg (Calculated) : 69.55  Temp (24hrs), Avg:101.3 F (38.5 C), Min:97.7 F (36.5 C), Max:103.4 F (39.7 C)  Recent Labs  Lab 06/05/20 1230 06/05/20 1518 06/05/20 1940 06/05/20 2339 06/06/20 0435 06/06/20 0947  WBC 4.1 2.4*  --   --  1.4* 2.1*  CREATININE 1.14 1.03 0.99 0.78 0.84  --     Estimated Creatinine Clearance: 58.1 mL/min (by C-G formula based on SCr of 0.84 mg/dL).    Allergies  Allergen Reactions  . Glycopyrrolate Rash    Antimicrobials this admission: 6/22 vanc >> 6/22 cefepime >> Dose adjustments this admission:   Microbiology results: 6/22 BCx:  6/22 UCx:    Thank you for allowing pharmacy to be a part of this patient's care.  Dolly Rias RPh 06/06/2020, 12:13 PM

## 2020-06-06 NOTE — Progress Notes (Signed)
Pt's wife, daughter and sister-in-law were bedside when I returned. His daughter talked w/me outside of pt room and was very tearful. She talked of her love for him and she doesn't want to lose him. She spoke of how she is already crying and he is not gone yet. We talked about loss prior to death in seeing him transitioning from being active to where he is now and it is also a part of loss. She said she is also afraid that if something happens to him that something will happen to her mother because they have been married 101 years. She talked of her love for him and that he is her best friend. She asked questions about which I referred her to her father's nurse. She was able to ask questions later and had a better understanding of her dad's condition. She said she does not want to cry in the room and upset him and her mom. She was very grateful for pastoral support as I provided empathic care.  Family left room for me to speak w/Tony Marshall alone to confirm his salvation in [place]. He confirmed he is saved and is going to be with Jesus when that time comes. His wife was satisfied in knowing.   Please page if additional support is needed.  [place], [facility]   06/06/20 1900  Clinical Encounter Type  Visited With Patient and family together

## 2020-06-06 NOTE — Progress Notes (Addendum)
HEMATOLOGY-ONCOLOGY PROGRESS NOTE  SUBJECTIVE: The patient is seen in the emergency room.  He is having periods of agitation.  He knows where he is.  Family arrives at the bedside.  Has been having fevers up to 102.4 in the emergency room.  The patient was initially constipated at home and then developed diarrhea after taking laxatives.  Not currently having diarrhea.  Oncology History  CLL (chronic lymphocytic leukemia) ([place])  10/25/2013 Initial Diagnosis   CLL (chronic lymphocytic leukemia) ([place])   04/04/2020 -  Chemotherapy   The patient had palonosetron (ALOXI) injection 0.25 mg, 0.25 mg, Intravenous,  Once, 3 of 4 cycles Administration: 0.25 mg (04/04/2020), 0.25 mg (05/02/2020), 0.25 mg (05/30/2020) bendamustine (BENDEKA) 175 mg in sodium chloride 0.9 % 50 mL (3.0702 mg/mL) chemo infusion, 90 mg/m2 = 175 mg, Intravenous,  Once, 3 of 4 cycles Dose modification: 70 mg/m2 (original dose 90 mg/m2, Cycle 3, Reason: Provider Judgment, Comment: ANC ), 70 mg/m2 (original dose 90 mg/m2, Cycle 3, Reason: Other (see comments), Comment: low ANC) Administration: 175 mg (04/04/2020), 175 mg (04/05/2020), 150 mg (05/02/2020), 150 mg (05/03/2020), 125 mg (05/30/2020), 125 mg (05/31/2020) riTUXimab-pvvr (RUXIENCE) 700 mg in sodium chloride 0.9 % 250 mL (2.1875 mg/mL) infusion, 375 mg/m2 = 700 mg, Intravenous,  Once, 2 of 2 cycles Administration: 700 mg (04/04/2020)  for chemotherapy treatment.       REVIEW OF SYSTEMS:   Constitutional: The patient has fevers and chills. Eyes: Denies blurriness of vision Ears, nose, mouth, throat, and face: Denies mucositis or sore throat Respiratory: Denies cough, dyspnea or wheezes Cardiovascular: Denies palpitation, chest discomfort Gastrointestinal: Denies nausea vomiting, had diarrhea prior to admission Skin: Denies abnormal skin rashes Lymphatics: Denies new lymphadenopathy or easy bruising Neurological:Denies numbness, tingling or new weaknesses Behavioral/Psych:  Agitated at times Extremities: No lower extremity edema All other systems were reviewed with the patient and are negative.  I have reviewed the past medical history, past surgical history, social history and family history with the patient and they are unchanged from previous note.   PHYSICAL EXAMINATION: ECOG PERFORMANCE STATUS: 2 - Symptomatic, <50% confined to bed  Vitals:   06/06/20 1400 06/06/20 1500  BP: (!) 117/96 (!) 111/56  Pulse: (!) 106 (!) 106  Resp: (!) 23 (!) 23  Temp: 98.5 F (36.9 C)   SpO2: 97% 100%   Filed Weights   06/05/20 1451  Weight: 54.9 kg    Intake/Output from previous day: 06/21 0701 - 06/22 0700 In: 3823.3 [I.V.:2693.3; IV Piggyback:1130] Out: -   GENERAL: Chronically ill-appearing male, no distress SKIN: skin color, texture, turgor are normal, no rashes or significant lesions EYES: normal, Conjunctiva are pink and non-injected, sclera clear OROPHARYNX:no exudate, no erythema and lips, buccal mucosa, and tongue normal  NECK: supple, thyroid normal size, non-tender, without nodularity LYMPH:  no palpable lymphadenopathy in the cervical, axillary or inguinal LUNGS: clear to auscultation and percussion with normal breathing effort HEART: regular rate & rhythm and no murmurs and no lower extremity edema ABDOMEN:abdomen soft, non-tender and normal bowel sounds Musculoskeletal:no cyanosis of digits and no clubbing  NEURO: Alert and oriented, agitated  LABORATORY DATA:  I have reviewed the data as listed CMP Latest Ref Rng & Units 06/06/2020 06/06/2020 06/05/2020  Glucose 70 - 99 mg/dL 65(L) 134(H) 115(H)  BUN 8 - 23 mg/dL 19 16 17   Creatinine 0.61 - 1.24 mg/dL 0.98 0.84 0.78  Sodium 135 - 145 mmol/L 134(L) 134(L) 135  Potassium 3.5 - 5.1 mmol/L 3.4(L) 2.6(LL) 2.7(LL)  Chloride 98 - 111 mmol/L 100 99 99  CO2 22 - 32 mmol/L 24 23 25   Calcium 8.9 - 10.3 mg/dL 7.8(L) 7.8(L) 7.9(L)  Total Protein 6.5 - 8.1 g/dL 4.6(L) 4.6(L) -  Total Bilirubin 0.3 -  1.2 mg/dL 2.0(H) 1.7(H) -  Alkaline Phos 38 - 126 U/L 41 39 -  AST 15 - 41 U/L 39 26 -  ALT 0 - 44 U/L 14 12 -    Lab Results  Component Value Date   WBC 2.1 (L) 06/06/2020   HGB 7.7 (L) 06/06/2020   HCT 23.2 (L) 06/06/2020   MCV 98.7 06/06/2020   PLT 163 06/06/2020   NEUTROABS 0.2 (L) 06/06/2020    DG Chest 2 View  Result Date: 05/16/2020 CLINICAL DATA:  Shortness of breath. EXAM: CHEST - 2 VIEW COMPARISON:  November 29, 2007. FINDINGS: The heart size and mediastinal contours are within normal limits. Both lungs are clear. No pneumothorax or pleural effusion is noted. The visualized skeletal structures are unremarkable. IMPRESSION: No active cardiopulmonary disease. Aortic Atherosclerosis (ICD10-I70.0). Electronically Signed   By: Marijo Conception M.D.   On: 05/16/2020 12:48   CT Head Wo Contrast  Result Date: 06/05/2020 CLINICAL DATA:  Altered mental status fatigue and weakness EXAM: CT HEAD WITHOUT CONTRAST TECHNIQUE: Contiguous axial images were obtained from the base of the skull through the vertex without intravenous contrast. COMPARISON:  MRI 11/07/2014 FINDINGS: Brain: No acute territorial infarction, hemorrhage, or intracranial mass. Mild to moderate atrophy. Mild hypodensity in the white matter consistent with chronic small vessel ischemic change. Prominent ventricle size, likely due to atrophy. Vascular: No hyperdense vessels.  No unexpected calcification Skull: Motion artifact.  No gross fracture Sinuses/Orbits: No acute finding. Other: None IMPRESSION: 1. No CT evidence for acute intracranial abnormality. 2. Atrophy and mild chronic small vessel ischemic changes of the white matter. Electronically Signed   By: Donavan Foil M.D.   On: 06/05/2020 17:29   CT Angio Chest PE W/Cm &/Or Wo Cm  Result Date: 05/16/2020 CLINICAL DATA:  Weakness, weight loss EXAM: CT ANGIOGRAPHY CHEST WITH CONTRAST TECHNIQUE: Multidetector CT imaging of the chest was performed using the standard protocol  during bolus administration of intravenous contrast. Multiplanar CT image reconstructions and MIPs were obtained to evaluate the vascular anatomy. CONTRAST:  143mL OMNIPAQUE IOHEXOL 350 MG/ML SOLN COMPARISON:  None. FINDINGS: Cardiovascular: No filling defects in the pulmonary arteries to suggest pulmonary emboli. Heart is borderline in size. Coronary artery and aortic atherosclerosis. Mediastinum/Nodes: No mediastinal, hilar, or axillary adenopathy. Trachea and esophagus are unremarkable. Thyroid unremarkable. Lungs/Pleura: Ovoid platelike density noted medially in the right lower lobe measuring 3.4 x 1.4 cm on image 101 of series 10. Nodular densities at the left lung base measuring up to 12 mm on image 101. Peripheral right lower lobe nodule measures 6 mm on image 91. No effusions. Upper Abdomen: Imaging into the upper abdomen shows no acute findings. Musculoskeletal: Chest wall soft tissues are unremarkable. No acute bony abnormality. Review of the MIP images confirms the above findings. IMPRESSION: No evidence of pulmonary embolus. Coronary artery disease. Multiple nodular densities in both lung bases and lower lobes, the largest a platelike ovoid density medially in the right lower lobe. Non-contrast chest CT at 3-6 months is recommended. If the nodules are stable at time of repeat CT, then future CT at 18-24 months (from today's scan) is considered optional for low-risk patients, but is recommended for high-risk patients. This recommendation follows the consensus statement: Guidelines for Management  of Incidental Pulmonary Nodules Detected on CT Images: From the Fleischner Society 2017; Radiology 2017; [phone]. Aortic Atherosclerosis (ICD10-I70.0). Electronically Signed   By: Rolm Baptise M.D.   On: 05/16/2020 20:15   DG CHEST PORT 1 VIEW  Result Date: 06/06/2020 CLINICAL DATA:  Possible pneumonia EXAM: PORTABLE CHEST 1 VIEW COMPARISON:  06/05/2020 FINDINGS: Cardiac shadow is stable. Tortuous aorta  with calcifications is again seen. Lungs are well aerated bilaterally. No focal infiltrate or sizable effusion is seen. Some accentuation of the mediastinum is noted due to patient rotation. IMPRESSION: No acute abnormality noted. Electronically Signed   By: Inez Catalina M.D.   On: 06/06/2020 13:25   DG Chest Portable 1 View  Result Date: 06/05/2020 CLINICAL DATA:  Fatigue and weakness. EXAM: PORTABLE CHEST 1 VIEW COMPARISON:  May 16, 2020 FINDINGS: The study is limited secondary to patient rotation. There is no evidence of acute infiltrate, pleural effusion or pneumothorax. The heart size and mediastinal contours are within normal limits. A chronic tenth left rib fracture is seen. The visualized skeletal structures are otherwise unremarkable. IMPRESSION: No active disease. Electronically Signed   By: Virgina Norfolk M.D.   On: 06/05/2020 16:07    ASSESSMENT AND PLAN: This is a very pleasant 79 year old Caucasian male with chronic lymphocytic leukemia diagnosed 2014.  He has been on observation since that time until April 2021.  In January 2021, he noticed left cervical lymphadenopathy and increase in his total WBC count.  His anemia worsened as well.  The patient was started on systemic treatment with bendamustine 90 mg per metered squared on days 1 to and Rituxan 325 mg per metered squared on day 1 every 4 weeks.  He has completed 3 cycles of treatment.  With cycle #3, the bendamustine dose was reduced to 70 mg per metered squared secondary to neutropenia.  The patient is now admitted with severe failure to thrive, dehydration, cachexia.  He also has significant electrolyte abnormalities.  The patient is neutropenic and having fevers.  We will start Granix 300 mcg subcu daily.  I have ordered 3 doses.  He should continue Granix until his [place] is above 1.5.  Await blood cultures.  Recommend broad-spectrum antibiotics while awaiting cultures.  The patient is anemic with a hemoglobin of 7.7 today.   Transfuse for hemoglobin less than 7.  I have placed a dietitian consult for poor appetite and weight loss.   LOS: 1 day   Mikey Bussing, DNP, AGPCNP-BC, AOCNP 06/06/20  ADDENDUM: Hematology/Oncology Attending: I had a face-to-face encounter with the patient today.  I recommended his care plan and agree with the above note.  His wife and sister-in-law were at the bedside.  The patient has a history of progressive CLL and he started on systemic treatment with bendamustine and Rituxan status post three cycles.  He has been tolerating this treatment well with no concerning complaints with almost complete resolution of the lymphadenopathy in the neck area. The patient presented to the symptom management clinic yesterday complaining of confusion as well as generalized weakness and fatigue.  He was found to have high blood sugar with high anion gap suspicious for diabetic ketoacidosis.  He was not taking his insulin as prescribed in the last few days because he was not eating well.  The patient admitted to the hospital and he is currently on the ICU for close monitoring of his condition.  He also has chemotherapy-induced neutropenia. I had a lengthy discussion with the patient and his family today about  his condition.  We will continue to do our best supportive care for him for now since the patient has a very good prognosis from his CLL. For the chemotherapy-induced anemia, I will arrange for the patient to receive 2 units of PRBCs transfusion tonight. For the diabetic ketoacidosis and hypokalemia, the patient is closely monitored by the primary team. For the chemotherapy-induced neutropenia, he is currently on Granix 300 mcg subcutaneously until his absolute neutrophil count is over 1000. I may consider discontinuing cycle #4 of his treatment because of the recent complication. Thank you for taking good care of Mr. Halseth, I will continue to follow up the patient with you and assist in his management  on as-needed basis.  Disclaimer: This note was dictated with voice recognition software. Similar sounding words can inadvertently be transcribed and may be missed upon review. Eilleen Kempf, MD

## 2020-06-06 NOTE — Progress Notes (Signed)
ABG sent with Lab staff for analysis at approximately 1710.

## 2020-06-06 NOTE — Consult Note (Addendum)
NAME:  Tony Marshall, MRN:  [id number], DOB:  July 11, 1941, LOS: 1 ADMISSION DATE:  06/05/2020, CONSULTATION DATE:  06/06/2020 REFERRING MD: Nada Libman MD , CHIEF COMPLAINT:  Sepsis  Brief History   79 year old with CLL on Rituxan, [place], diabetes, hypertension, hyperlipidemia admitted with diarrhea, malaise, mild DKA requiring insulin drip.  Labs significant for pancytopenia, mild hyperbilirubinemia, electrolyte abnormalities.  Transferred to ICU on 6/22 for hypotension, sepsis with elevated lactic acid. PCCM consulted for help with management.  Past Medical History    has a past medical history of CLL (chronic lymphocytic leukemia) ([place]) (dx'd 2014), Diabetes mellitus without complication ([place]), Diverticulosis, External hemorrhoids, GERD (gastroesophageal reflux disease), Hiatal hernia, History of colon polyps, Hyperlipidemia, Hypertension, Internal hemorrhoids, Lymphocytosis, Schatzki's ring, and Sleep apnea.  Significant Hospital Events   6/21 Admit 6/22 rapid response, transfer to ICU  Consults:  PCCM  Procedures:    Significant Diagnostic Tests:    Micro Data:    Antimicrobials:  Vancomycin 6/22 >>  Cefepime 6/22 >>  Interim history/subjective:    Objective   Blood pressure 105/60, pulse (!) 109, temperature 100.1 F (37.8 C), temperature source Rectal, resp. rate 20, height 5' 8.5" (1.74 m), weight 59 kg, SpO2 100 %.        Intake/Output Summary (Last 24 hours) at 06/06/2020 1811 Last data filed at 06/06/2020 1740 Gross per 24 hour  Intake 5392.19 ml  Output 400 ml  Net 4992.19 ml   Filed Weights   06/05/20 1451 06/06/20 1734  Weight: 54.9 kg 59 kg    Examination: Blood pressure 105/60, pulse (!) 109, temperature 100.1 F (37.8 C), temperature source Rectal, resp. rate 20, height 5' 8.5" (1.74 m), weight 59 kg, SpO2 100 %. Gen:      No acute distress, frail, chronically ill-appearing HEENT:  EOMI, sclera icteric Neck:     No masses; no  thyromegaly Lungs:    Clear to auscultation bilaterally; normal respiratory effort CV:         Regular rate and rhythm; no murmurs Abd:   Mild abdominal tenderness, diminished bowel sounds Ext:    No edema; adequate peripheral perfusion Skin:      Warm and dry; no rash Neuro: Awake, confused  Resolved Hospital Problem list     Assessment & Plan:  Septic shock with unclear source, unknown organism.  Sepsis present on admission Could have an abdominal source with diarrhea on presentation which is thought to be chemo related He does have mild abdominal tenderness We will get CT chest abdomen pelvis and check C. difficile Continue broad antibiotic coverage Bolus LR 1 L and start infusion at 100 cc an hour Follow cultures.  Monitor lactic acid  Elevated sugars, mild DKA on admission Continue SSI, Lantus  AKI, hypokalemia Monitor urine output and creatinine Replete electrolytes  Pancytopenia, secondary to CLL, chemotherapy Transfuse for hemoglobin less than 7 Granix 300 mcg subcu daily  Elevated LFTs.  May be secondary to sepsis Review CT when available Monitor labs  Severe malnutrition We will need nutrition consult when able to take p.o.  Acute metabolic encephalopathy secondary to sepsis Monitor, supportive care  Goals of care Updated wife and sister-in-law at bedside.  Discussed case clinical deterioration.  Recommended DNR status given frailty, malnourishment.  I do not think he will survive intubation or cardiac arrest.  His wife is not ready to give up yet and has requested full code.  Best practice:  Diet: NPO Pain/Anxiety/Delirium protocol (if indicated): NA VAP protocol (if indicated):  NA DVT prophylaxis: Lovenix GI prophylaxis: PPI Glucose control: SSI, lantus Mobility: Bed Code Status: Full Family Communication: Updated Disposition: ICU  Labs   CBC: Recent Labs  Lab 06/05/20 1230 06/05/20 1518 06/06/20 0435 06/06/20 0947 06/06/20 1708  WBC 4.1  2.4* 1.4* 2.1* 1.3*  NEUTROABS 0.1* 0.1*  --  0.2*  --   HGB 9.8* 9.4* 7.6* 7.7* 7.3*  HCT 29.5* 27.7* 22.8* 23.2* 21.5*  MCV 96.1 97.5 95.4 98.7 96.8  PLT 300 238 179 163 563    Basic Metabolic Panel: Recent Labs  Lab 06/05/20 1230 06/05/20 1518 06/05/20 1940 06/05/20 2339 06/06/20 0435 06/06/20 0947 06/06/20 1241 06/06/20 1708  NA 133*   < > 136 135 134*  --  134* 133*  K 3.4*   < > 2.7* 2.7* 2.6*  --  3.4* 2.8*  CL 91*   < > 98 99 99  --  100 102  CO2 22   < > 24 25 23   --  24 22  GLUCOSE 348*   < > 144* 115* 134*  --  65* 114*  BUN 19   < > 17 17 16   --  19 23  CREATININE 1.14   < > 0.99 0.78 0.84  --  0.98 1.09  CALCIUM 9.0   < > 8.3* 7.9* 7.8*  --  7.8* 7.1*  MG 1.5*  --   --   --   --  1.5*  --  2.1   < > = values in this interval not displayed.   GFR: Estimated Creatinine Clearance: 48.1 mL/min (by C-G formula based on SCr of 1.09 mg/dL). Recent Labs  Lab 06/05/20 1518 06/06/20 0435 06/06/20 0947 06/06/20 1241 06/06/20 1444 06/06/20 1708  WBC 2.4* 1.4* 2.1*  --   --  1.3*  LATICACIDVEN  --   --   --  3.5* 3.3*  --     Liver Function Tests: Recent Labs  Lab 06/05/20 1230 06/05/20 1518 06/06/20 0435 06/06/20 1241 06/06/20 1708  AST 12* 18 26 39 36  ALT 11 13 12 14 14   ALKPHOS 60 47 39 41 37*  BILITOT 1.5* 2.6* 1.7* 2.0* 1.7*  PROT 5.6* 5.7* 4.6* 4.6* 3.9*  ALBUMIN 3.2* 3.3* 2.7* 2.5* 2.0*   No results for input(s): LIPASE, AMYLASE in the last 168 hours. Recent Labs  Lab 06/06/20 0435  AMMONIA 20    ABG    Component Value Date/Time   PHART 7.563 (H) 06/06/2020 1710   PCO2ART 25.2 (L) 06/06/2020 1710   PO2ART 278 (H) 06/06/2020 1710   HCO3 22.7 06/06/2020 1710   TCO2 25 11/29/2007 1303   ACIDBASEDEF 2.3 (H) 06/05/2020 1616   O2SAT 100.0 06/06/2020 1710     Coagulation Profile: No results for input(s): INR, PROTIME in the last 168 hours.  Cardiac Enzymes: No results for input(s): CKTOTAL, CKMB, CKMBINDEX, TROPONINI in the last 168  hours.  HbA1C: Hgb A1c MFr Bld  Date/Time Value Ref Range Status  03/20/2020 02:03 PM 4.7 4.6 - 6.5 % Final    Comment:    Glycemic Control Guidelines for People with Diabetes:Non Diabetic:  <6%Goal of Therapy: <7%Additional Action Suggested:  >8%   12/20/2019 03:01 PM 6.7 (H) 4.6 - 6.5 % Final    Comment:    Glycemic Control Guidelines for People with Diabetes:Non Diabetic:  <6%Goal of Therapy: <7%Additional Action Suggested:  >8%     CBG: Recent Labs  Lab 06/06/20 0209 06/06/20 0324 06/06/20 0432 06/06/20 0542 06/06/20 1444  GLUCAP 99 107* 133* 128* 79    Review of Systems:   Unable to obtain due to encephalopathy  Past Medical History  He,  has a past medical history of CLL (chronic lymphocytic leukemia) ([place]) (dx'd 2014), Diabetes mellitus without complication ([place]), Diverticulosis, External hemorrhoids, GERD (gastroesophageal reflux disease), Hiatal hernia, History of colon polyps, Hyperlipidemia, Hypertension, Internal hemorrhoids, Lymphocytosis, Schatzki's ring, and Sleep apnea.   Surgical History    Past Surgical History:  Procedure Laterality Date  . BLEPHAROPLASTY    . COLONOSCOPY  2012  . HEMORRHOID SURGERY     Sclerotherapy  . KNEE SURGERY Right 2002  . POLYPECTOMY    . SHOULDER SURGERY Right 2007     Social History   reports that he has never smoked. He quit smokeless tobacco use about 7 years ago.  His smokeless tobacco use included chew. He reports current alcohol use of about 6.0 standard drinks of alcohol per week. He reports that he does not use drugs.   Family History   His family history includes CAD in his maternal aunt; Kidney disease in his mother. There is no history of Colon cancer, Esophageal cancer, Rectal cancer, or Stomach cancer.   Allergies Allergies  Allergen Reactions  . Glycopyrrolate Rash     Home Medications  Prior to Admission medications   Medication Sig Start Date End Date Taking? Authorizing Provider  acetaminophen  (TYLENOL) 500 MG tablet Take 500 mg by mouth every 6 (six) hours as needed for moderate pain.   Yes [provider]  allopurinol (ZYLOPRIM) 100 MG tablet Take 1 tablet (100 mg total) by mouth 2 (two) times daily. 03/28/20  Yes Curt Bears, MD  amLODipine (NORVASC) 5 MG tablet Take 1 tablet (5 mg total) by mouth daily. 03/27/20  Yes Elayne Snare, MD  aspirin 81 MG EC tablet Take 81 mg by mouth daily. Swallow whole.   Yes [provider]  benazepril (LOTENSIN) 20 MG tablet TAKE 1 TABLET BY MOUTH EVERY DAY 03/21/20  Yes Elayne Snare, MD  carvedilol (COREG) 25 MG tablet TAKE ONE TABLET BY MOUTH TWICE DAILY  02/10/20  Yes Elayne Snare, MD  doxazosin (CARDURA) 4 MG tablet Take 1/2 tablet (2mg  total) by mouth once daily. If systolic BP is below 941, stop taking. 05/22/20  Yes Elayne Snare, MD  dronabinol (MARINOL) 2.5 MG capsule Take 1 capsule (2.5 mg total) by mouth 2 (two) times daily before a meal. 05/30/20  Yes Heilingoetter, Cassandra L, PA-C  esomeprazole (NEXIUM) 40 MG capsule TAKE 1 CAPSULE BY MOUTH EVERY MORNING 30 MINUTES BEFORE BREAKFAST Patient taking differently: Take 40 mg by mouth daily.  04/28/20  Yes Pyrtle, Lajuan Lines, MD  glucagon (GLUCAGON EMERGENCY) 1 MG injection Inject 1 mg into the skin once as needed (For severe low blood sugar). 06/14/19  Yes Elayne Snare, MD  ibuprofen (ADVIL) 200 MG tablet Take 200 mg by mouth every 6 (six) hours as needed for headache or mild pain.   Yes [provider]  insulin aspart (NOVOLOG FLEXPEN) 100 UNIT/ML FlexPen Inject 3-4 Units into the skin 3 (three) times daily with meals. Inject 3-4 units under the skin three times daily before meals.    Yes [provider]  insulin degludec (TRESIBA FLEXTOUCH) 100 UNIT/ML SOPN FlexTouch Pen inject 12 units daily. if fasting blood sugars are over 140 increase dosage up to 15 units daily as directed. 03/22/19  Yes Elayne Snare, MD  loperamide (IMODIUM A-D) 2 MG tablet Take 2 mg by mouth  4 (four)  times daily as needed for diarrhea or loose stools.   Yes [provider]  metFORMIN (GLUCOPHAGE-XR) 500 MG 24 hr tablet Take 1 tablet (500 mg total) by mouth daily with supper. 09/16/19  Yes Elayne Snare, MD  oxymetazoline (AFRIN) 0.05 % nasal spray Place 1 spray into both nostrils 2 (two) times daily as needed for congestion.   Yes [provider]  pioglitazone (ACTOS) 15 MG tablet TAKE 1 TABLET BY MOUTH EVERY DAY 03/21/20  Yes Elayne Snare, MD  potassium chloride SA (KLOR-CON) 20 MEQ tablet Take 1 tablet (20 mEq total) by mouth 2 (two) times daily. 05/12/20  Yes Tanner, Lyndon Code., PA-C  prochlorperazine (COMPAZINE) 10 MG tablet Take 1 tablet (10 mg total) by mouth every 6 (six) hours as needed for nausea or vomiting. 03/28/20  Yes Curt Bears, MD  rosuvastatin (CRESTOR) 5 MG tablet TAKE 1 TABLET BY MOUTH EVERY DAY 04/21/20  Yes Elayne Snare, MD  [facility] VERIO test strip USE 1 STRIP TO [place] DAILY 02/24/20   Elayne Snare, MD     Critical care time:     The patient is critically ill with multiple organ system failure and requires high complexity decision making for assessment and support, frequent evaluation and titration of therapies, advanced monitoring, review of radiographic studies and interpretation of complex data.   Critical Care Time devoted to patient care services, exclusive of separately billable procedures, described in this note is 35 minutes.   Marshell Garfinkel MD [facility] Please see [email or web address] for pager details.  06/06/2020, 6:24 PM

## 2020-06-06 NOTE — Progress Notes (Signed)
   06/06/20 1650  Assess: MEWS Score  BP (!) 69/45  Pulse Rate 100  Resp (!) 24  Level of Consciousness Responds to Pain  Assess: MEWS Score  MEWS Temp 2  MEWS Systolic 3  MEWS Pulse 0  MEWS RR 1  MEWS LOC 2  MEWS Score 8  MEWS Score Color Red  Assess: if the MEWS score is Yellow or Red  Were vital signs taken at a resting state? Yes  Focused Assessment Documented focused assessment  Early Detection of Sepsis Score *See Row Information* High  MEWS guidelines implemented *See Row Information* Yes  Treat  MEWS Interventions Escalated (See documentation below)  Take Vital Signs  Increase Vital Sign Frequency  Red: Q 1hr X 4 then Q 4hr X 4, if remains red, continue Q 4hrs  Escalate  MEWS: Escalate Red: discuss with charge nurse/RN and provider, consider discussing with RRT  Notify: Charge Nurse/RN  Name of Charge Nurse/RN Notified Marissa  Date Charge Nurse/RN Notified 06/06/20  Time Charge Nurse/RN Notified [id number]  Notify: Provider  Provider Name/Title Dr. Verlon Au  Date Provider Notified 06/06/20  Time Provider Notified 7902 (MD at bedside)  Notification Type Face-to-face  Notification Reason Change in status  Response See new orders  Date of Provider Response 06/06/20  Time of Provider Response 4097  Notify: Rapid Response  Name of Rapid Response RN Notified Gerald Stabs  Date Rapid Response Notified 06/06/20  Time Rapid Response Notified 3532  Document  Patient Outcome Transferred/level of care increased

## 2020-06-06 NOTE — Progress Notes (Signed)
PROGRESS NOTE    Tony Marshall  VQM:[id number] DOB: [birth year] DOA: 06/05/2020 PCP: Elayne Snare, MD  Brief Narrative:  79 year old white male CLL diag 2014/-April 2021 start Rituxan Bendeka cycle #3-Dr. Lorna Few DM TY 2 uncontrolled on insulin HLD, gout, HTN, prior diverticulosis Rx [facility] ED volume depletion 05/16/2020-sent home  Represents from oncology office 6/21 profuse diarrhea, malaise, lightheadedness, anorexia hypomagnesemia, uncontrolled DM TY 2 anion gap 20, blood sugar 348 Given insulin GTT on admission   Assessment & Plan:   Active Problems:   Metabolic acidosis, increased anion gap (IAG)   1. Mild DKA and on admission with metabolic acidosis a. DKA physiology resolved-patient placed on transition orders b. Continue saline with K at 100 cc an hour given electrolyte disturbances c. Recheck a.m. labs 2. Metabolic encephalopathy not otherwise specified 3. ? Toxicdrome with sepsis query cause a. Ammonia is 20 indicating probably not the cause B12 is normal,RPR is in process please follow b. TSH 0.200 and unsure how to interpret the same in the setting of recent illness-if metabolic encephalopathy does not improve in 24 hours will get T4 and T3-usually do not check these labs in the hospital when altered mentation occurs c. Given high-grade fevers ANC of 200 and possible infectious etiology I will start broad-spectrum vancomycin and cefepime for likely neutropenic fever d. Follow blood culture urine culture and repeat portable chest x-ray 4. Profuse diarrhea?  2/2 chemotherapy-defer to oncologist Dr. Earlie Server a. No diarrhea since admission according to wife who is at bedside b. Monitor for resolution 5. Hypomagnesemia, severe hypokalemia and hyponatremia on admission likely secondary to osmotic component from hyperglycemia- AKI on admission-mild a. Received boluses of IV fluid b. Given saline at a rate of 500 cc/h today with 40 of K c. Magnesium is 1.5 therefore  replacing with 2 g today 6. Anemia/pancytopenia?  Likely secondary to CLL with possible acute component 2/2 Rituxan/Bendeka cycle #3 on 6/15 and dilutional in addition secondary to fluid resuscitation as above a. Will probably need transfusion of 1 unit of blood  given normal range of hemoglobin is in the 9 range-I will defer this decision to-oncology will be seen today b. Also will need IV iron later on this admission given iron levels and saturation ratios being low 7. Mild hyperbilirubinemia on admission a. Bilirubin is trending down from admission   DVT prophylaxis: lovenox Code Status:  Family Communication: Disposition:   Status is: Inpatient  Remains inpatient appropriate because:Hemodynamically unstable, Ongoing diagnostic testing needed not appropriate for outpatient work up, Unsafe d/c plan and IV treatments appropriate due to intensity of illness or inability to take PO   Dispo: The patient is from: Home              Anticipated d/c is to: SNF              Anticipated d/c date is: > 3 days              Patient currently is not medically stable to d/c.       Consultants:   oncolcogy  Procedures: multpel  Antimicrobials: vanc and cefepime    Subjective: Cannot obtain review of systems Patient otherwise seems confused and is in mittens when I saw him in the emergency room Not having any overt pain Was not able to tolerate any diet Wife is at the bedside somewhat concerned she is never seen him this confused She states that this is been going on as per HPI for several days without  any resolution  Objective: Vitals:   06/06/20 0430 06/06/20 0600 06/06/20 0656 06/06/20 0738  BP: (!) 101/54 (!) 104/58  109/70  Pulse: (!) 108 (!) 117  (!) 115  Resp: (!) 22 19  18   Temp:   (!) 102 F (38.9 C)   TempSrc:   Rectal   SpO2: 100% 100%  99%  Weight:      Height:        Intake/Output Summary (Last 24 hours) at 06/06/2020 0804 Last data filed at 06/06/2020  0535 Gross per 24 hour  Intake 3823.33 ml  Output --  Net 3823.33 ml   Filed Weights   06/05/20 1451  Weight: 54.9 kg    Examination:  General exam: Writhing in the bed without distress seems to orient to some degree quite sleepy Respiratory system: Clinically clear no added sound no rales no rhonchi Cardiovascular system: S1-S2 tachycardic 110 range no murmur rub or gallop Gastrointestinal system: Soft nontender no rebound no guarding. Central nervous system: Neurologically intact no focal deficit Extremities: ROM intact lower extremities no tenderness no joint swelling or effusion Skin: No edema Psychiatry: Confused but redirectable to some degree-predominantly sees sleepy  Data Reviewed: I have personally reviewed following labs and imaging studies Potassium 2.6 Iron 15, saturations 11 WBC 1.4 Hemoglobin 7.6-ANC 200   Radiology Studies: CT Head Wo Contrast  Result Date: 06/05/2020 CLINICAL DATA:  Altered mental status fatigue and weakness EXAM: CT HEAD WITHOUT CONTRAST TECHNIQUE: Contiguous axial images were obtained from the base of the skull through the vertex without intravenous contrast. COMPARISON:  MRI 11/07/2014 FINDINGS: Brain: No acute territorial infarction, hemorrhage, or intracranial mass. Mild to moderate atrophy. Mild hypodensity in the white matter consistent with chronic small vessel ischemic change. Prominent ventricle size, likely due to atrophy. Vascular: No hyperdense vessels.  No unexpected calcification Skull: Motion artifact.  No gross fracture Sinuses/Orbits: No acute finding. Other: None IMPRESSION: 1. No CT evidence for acute intracranial abnormality. 2. Atrophy and mild chronic small vessel ischemic changes of the white matter. Electronically Signed   By: Donavan Foil M.D.   On: 06/05/2020 17:29   DG Chest Portable 1 View  Result Date: 06/05/2020 CLINICAL DATA:  Fatigue and weakness. EXAM: PORTABLE CHEST 1 VIEW COMPARISON:  May 16, 2020 FINDINGS: The  study is limited secondary to patient rotation. There is no evidence of acute infiltrate, pleural effusion or pneumothorax. The heart size and mediastinal contours are within normal limits. A chronic tenth left rib fracture is seen. The visualized skeletal structures are otherwise unremarkable. IMPRESSION: No active disease. Electronically Signed   By: Virgina Norfolk M.D.   On: 06/05/2020 16:07     Scheduled Meds: . enoxaparin (LOVENOX) injection  40 mg Subcutaneous Q24H  . folic acid  1 mg Intravenous Daily  . insulin glargine  10 Units Subcutaneous Daily  . pantoprazole (PROTONIX) IV  40 mg Intravenous Q24H  . thiamine injection  100 mg Intravenous Daily   Continuous Infusions: . sodium chloride Stopped (06/06/20 0535)  . dextrose 5 % and 0.45% NaCl Stopped (06/06/20 0535)  . insulin Stopped (06/06/20 0535)     LOS: 1 day    Time spent: [place], MD [facility] To contact the attending provider between 7A-7P or the covering provider during after hours 7P-7A, please log into the web site [email or web address] and access using universal [place] password for that web site. If you do not have the password, please call the hospital operator.  06/06/2020, 8:04  AM    

## 2020-06-06 NOTE — Progress Notes (Signed)
When I arrived attending physician was bedside updating pt and family. Pt's wife and sister-in-law were bedside w/pt. Pt's wife said they are expecting their daughter around 7:15 and will need support. I will wait for her arrival. Meanwhile they wanted prayer, which was held bedside of pt. He appreciated prayer as well as his wife and family member. Wife spoke w/me outside of pt's room and said she is not sure of his salvation conviction and wanted me to confirm with him that he is saved. She suggested having the conversation absent of visitors and she wants me to get back w/her. She said he said he was saved when he was younger but had not lived that lifestyle. I will return to pt at an appropriate time and report same to his wife. Meanwhile, if support is needed please page. [place], MDiv   06/06/20 1800  Clinical Encounter Type  Visited With Patient and family together

## 2020-06-06 NOTE — ED Notes (Signed)
Paged hospitalist regarding patients lantus. Hospitalist also made aware that patients temperature is still high at 102. 6.

## 2020-06-06 NOTE — Progress Notes (Signed)
CRITICAL VALUE ALERT  Critical Value:  Lactic acid 3.5  Date & Time Notied:  06/06/20 at 1357  Provider Notified: Dr. Verlon Au  Orders Received/Actions taken: No new orders, continue to infuse pt ordered fluids and closely monitor.

## 2020-06-06 NOTE — Progress Notes (Signed)
MD came to assess pt and at bedside; charge and rapid response RN called to room. New orders received and implemented. Foley inserted by charge RN Marissa, fluid bolus infusing; frequent VSS ordered, pt transferred to ICU. Pt transported off unit via bed with belongings to the side. Delia Heady RN   06/06/20 1657  Vital Signs  BP (!) 80/53  BP Location Right Arm  Patient Position (if appropriate) Lying  BP Method Automatic  Pulse Rate (!) 105  Pulse Rate Source Monitor  Resp (!) 25  Oxygen Therapy  SpO2 100 %  O2 Device Nasal Cannula  O2 Flow Rate (L/min) 2 L/min

## 2020-06-06 NOTE — ED Notes (Signed)
Date and time results received: 06/06/20 5:19 AM  Test: Potassium Critical Value: 2.6  Name of Provider Notified: Hal Hope, MD  Orders Received? Or Actions Taken?:

## 2020-06-06 NOTE — Progress Notes (Signed)
Initial Nutrition Assessment  RD working remotely.  DOCUMENTATION CODES:   Not applicable  INTERVENTION:  - will order [employer] BID, each supplement provides 455 kcal and 20 grams protein. - will order 2 scoops Beneprotein/meal, each scoop provides 25 kcal and 6 grams protein (can be added to any foods or beverages). - will order 1 tablet multivitamin with minerals/day.  - will complete NFPE at follow-up.   NUTRITION DIAGNOSIS:   Increased nutrient needs related to chronic illness, cancer and cancer related treatments as evidenced by estimated needs.  GOAL:   Patient will meet greater than or equal to 90% of their needs  MONITOR:   PO intake, Supplement acceptance, Labs, Weight trends  REASON FOR ASSESSMENT:   Malnutrition Screening Tool, Consult Assessment of nutrition requirement/status  ASSESSMENT:   79 y.o. male with medical hx of type 2 DM, diverticulosis, GERD, HLD, HTN, and CLL diagnosed 09/2013 undergoing active treatment with bendamustine and Rituxan (last treatment: 0/96) complicated by recurrent episodes of fatigue, generalized weakness, weight loss, lightheadedness, and poor appetite. He was seen in the ED 6/1 with severe dehydration and a 17 pound weight loss but was able to be sent home after volume resuscitation. He had severe constipation a few days PTA and used OTC agents to treat which then led to refractory diarrhea. He has been refusing food and beverages and experiencing worsening weakness. He was transferred from the [place] to the ED due to severe AMS and significant hyperglycemia.  Flow sheet documentation indicates patient is disoriented x4 today.   Diet advanced from NPO to CLD yesterday at 1833 and then to Carb Modified today at 0819. No intakes documented this admission.   Review of MST score report indicates that patient or family member reported decreased appetite and 33+ lb weight loss in the past few months.   Per chart review, weight  yesterday was 121 lb and weight on 6/1 was 126 lb. This indicates 5 lb weight loss (4% body weight) in the past 3 weeks. Weight on 05/02/20 was 142 lb which indicates 16 lb weight loss (11.3% body weight) in the past 1 month; significant for time frame.  Will monitor wt closely given previous ED visit(s) for severe dehydration. Suspect meets criteria for some degree of malnutrition, but do not feel comfortable stating severity without completing NFPE.  Per notes: - mild DKA - metabolic encephalopathy - hypokalemia and hypomagnesemia - AKI   Labs reviewed; CBGs: 99, 107, 133, 128 mg/dl, Na: 134 mmol/l, K: 3.4 mmol/l, Ca: 7.8 mg/dl, Mg: 1.5 mg/dl. Medications reviewed; 1 mg IV folic acid/day, sliding scale novolog, 10 units lantus/day, 2 g IV Mg sulfate x1 run 6/21 and x1 run 6/22, 10 mEq IV KCl x4 runs 6/21, 100 mg IV thiamine/day. IVF; NS-40 mEq KCl @ 100 ml/hr.     NUTRITION - FOCUSED PHYSICAL EXAM:  unable to complete at this time.   Diet Order:   Diet Order            Diet Carb Modified Fluid consistency: Thin; Room service appropriate? Yes  Diet effective now                 EDUCATION NEEDS:   Not appropriate for education at this time  Skin:  Skin Assessment: Reviewed RN Assessment  Last BM:  6/22  Height:   Ht Readings from Last 1 Encounters:  06/05/20 5' 8.5" (1.74 m)    Weight:   Wt Readings from Last 1 Encounters:  06/05/20 54.9  kg     Estimated Nutritional Needs:  Kcal:  1920-2200 kcal Protein:  100-115 grams Fluid:  >/= 2.2 L/day     Jarome Matin, MS, RD, LDN, CNSC Inpatient Clinical Dietitian RD pager # available in AMION  After hours/weekend pager # available in [facility]

## 2020-06-06 NOTE — ED Notes (Signed)
Admitting provider at bedside.

## 2020-06-07 ENCOUNTER — Inpatient Hospital Stay (HOSPITAL_COMMUNITY): Payer: Medicare HMO

## 2020-06-07 ENCOUNTER — Inpatient Hospital Stay: Payer: Self-pay

## 2020-06-07 DIAGNOSIS — R627 Adult failure to thrive: Secondary | ICD-10-CM

## 2020-06-07 DIAGNOSIS — I340 Nonrheumatic mitral (valve) insufficiency: Secondary | ICD-10-CM

## 2020-06-07 DIAGNOSIS — M7989 Other specified soft tissue disorders: Secondary | ICD-10-CM

## 2020-06-07 DIAGNOSIS — R4182 Altered mental status, unspecified: Secondary | ICD-10-CM

## 2020-06-07 DIAGNOSIS — M79609 Pain in unspecified limb: Secondary | ICD-10-CM

## 2020-06-07 DIAGNOSIS — D649 Anemia, unspecified: Secondary | ICD-10-CM

## 2020-06-07 DIAGNOSIS — J9600 Acute respiratory failure, unspecified whether with hypoxia or hypercapnia: Secondary | ICD-10-CM

## 2020-06-07 LAB — BLOOD GAS, ARTERIAL
Acid-base deficit: 1.5 mmol/L (ref 0.0–2.0)
Bicarbonate: 21.2 mmol/L (ref 20.0–28.0)
O2 Saturation: 100 %
Patient temperature: 98.2
pCO2 arterial: 27.9 mmHg — ABNORMAL LOW (ref 32.0–48.0)
pH, Arterial: 7.491 — ABNORMAL HIGH (ref 7.350–7.450)
pO2, Arterial: 308 mmHg — ABNORMAL HIGH (ref 83.0–108.0)

## 2020-06-07 LAB — BASIC METABOLIC PANEL
Anion gap: 10 (ref 5–15)
BUN: 34 mg/dL — ABNORMAL HIGH (ref 8–23)
CO2: 19 mmol/L — ABNORMAL LOW (ref 22–32)
Calcium: 6.9 mg/dL — ABNORMAL LOW (ref 8.9–10.3)
Chloride: 105 mmol/L (ref 98–111)
Creatinine, Ser: 1.14 mg/dL (ref 0.61–1.24)
GFR calc Af Amer: >60 mL/min (ref >60)
GFR calc non Af Amer: >60 mL/min (ref >60)
Glucose, Bld: 159 mg/dL — ABNORMAL HIGH (ref 70–99)
Potassium: 4.3 mmol/L (ref 3.5–5.1)
Sodium: 134 mmol/L — ABNORMAL LOW (ref 135–145)

## 2020-06-07 LAB — CBC WITH DIFFERENTIAL/PLATELET
Abs Immature Granulocytes: 0.03 10*3/uL (ref 0.00–0.07)
Basophils Absolute: 0 10*3/uL (ref 0.0–0.1)
Basophils Relative: 0 %
Eosinophils Absolute: 0 10*3/uL (ref 0.0–0.5)
Eosinophils Relative: 0 %
HCT: 18.8 % — ABNORMAL LOW (ref 39.0–52.0)
Hemoglobin: 6.2 g/dL — CL (ref 13.0–17.0)
Immature Granulocytes: 2 %
Lymphocytes Relative: 61 %
Lymphs Abs: 1.2 10*3/uL (ref 0.7–4.0)
MCH: 32.3 pg (ref 26.0–34.0)
MCHC: 33 g/dL (ref 30.0–36.0)
MCV: 97.9 fL (ref 80.0–100.0)
Monocytes Absolute: 0.4 10*3/uL (ref 0.1–1.0)
Monocytes Relative: 18 %
Neutro Abs: 0.4 10*3/uL — ABNORMAL LOW (ref 1.7–7.7)
Neutrophils Relative %: 19 %
Platelets: 109 10*3/uL — ABNORMAL LOW (ref 150–400)
RBC: 1.92 MIL/uL — ABNORMAL LOW (ref 4.22–5.81)
RDW: 17.3 % — ABNORMAL HIGH (ref 11.5–15.5)
WBC: 1.9 10*3/uL — ABNORMAL LOW (ref 4.0–10.5)
nRBC: 0 % (ref 0.0–0.2)

## 2020-06-07 LAB — COMPREHENSIVE METABOLIC PANEL
ALT: 67 U/L — ABNORMAL HIGH (ref 0–44)
AST: 87 U/L — ABNORMAL HIGH (ref 15–41)
Albumin: 1.4 g/dL — ABNORMAL LOW (ref 3.5–5.0)
Alkaline Phosphatase: 35 U/L — ABNORMAL LOW (ref 38–126)
Anion gap: 0 — ABNORMAL LOW (ref 5–15)
BUN: 22 mg/dL (ref 8–23)
CO2: 19 mmol/L — ABNORMAL LOW (ref 22–32)
Calcium: 5.2 mg/dL — CL (ref 8.9–10.3)
Chloride: 124 mmol/L — ABNORMAL HIGH (ref 98–111)
Creatinine, Ser: 1.2 mg/dL (ref 0.61–1.24)
GFR calc Af Amer: >60 mL/min (ref >60)
GFR calc non Af Amer: 58 mL/min — ABNORMAL LOW (ref >60)
Glucose, Bld: 81 mg/dL (ref 70–99)
Potassium: 2.7 mmol/L — CL (ref 3.5–5.1)
Sodium: 134 mmol/L — ABNORMAL LOW (ref 135–145)
Total Bilirubin: 1.7 mg/dL — ABNORMAL HIGH (ref 0.3–1.2)
Total Protein: 3.5 g/dL — ABNORMAL LOW (ref 6.5–8.1)

## 2020-06-07 LAB — GLUCOSE, CAPILLARY
Glucose-Capillary: 105 mg/dL — ABNORMAL HIGH (ref 70–99)
Glucose-Capillary: 111 mg/dL — ABNORMAL HIGH (ref 70–99)
Glucose-Capillary: 115 mg/dL — ABNORMAL HIGH (ref 70–99)
Glucose-Capillary: 147 mg/dL — ABNORMAL HIGH (ref 70–99)
Glucose-Capillary: 153 mg/dL — ABNORMAL HIGH (ref 70–99)
Glucose-Capillary: 155 mg/dL — ABNORMAL HIGH (ref 70–99)
Glucose-Capillary: 98 mg/dL (ref 70–99)

## 2020-06-07 LAB — MAGNESIUM
Magnesium: 1.6 mg/dL — ABNORMAL LOW (ref 1.7–2.4)
Magnesium: 2.5 mg/dL — ABNORMAL HIGH (ref 1.7–2.4)

## 2020-06-07 LAB — ECHOCARDIOGRAM COMPLETE
Height: 68.5 in
Weight: 2081.14 oz

## 2020-06-07 LAB — C DIFFICILE QUICK SCREEN W PCR REFLEX
C Diff antigen: NEGATIVE
C Diff interpretation: NOT DETECTED
C Diff toxin: NEGATIVE

## 2020-06-07 LAB — DIRECT ANTIGLOBULIN TEST (NOT AT ARMC)
DAT, IgG: POSITIVE
DAT, complement: POSITIVE

## 2020-06-07 LAB — HEMOGLOBIN A1C
Hgb A1c MFr Bld: 7.7 % — ABNORMAL HIGH (ref 4.8–5.6)
Mean Plasma Glucose: 174 mg/dL

## 2020-06-07 LAB — CBC
HCT: 33.5 % — ABNORMAL LOW (ref 39.0–52.0)
Hemoglobin: 11.2 g/dL — ABNORMAL LOW (ref 13.0–17.0)
MCH: 31 pg (ref 26.0–34.0)
MCHC: 33.4 g/dL (ref 30.0–36.0)
MCV: 92.8 fL (ref 80.0–100.0)
Platelets: 119 10*3/uL — ABNORMAL LOW (ref 150–400)
RBC: 3.61 MIL/uL — ABNORMAL LOW (ref 4.22–5.81)
RDW: 18.1 % — ABNORMAL HIGH (ref 11.5–15.5)
WBC: 2.9 10*3/uL — ABNORMAL LOW (ref 4.0–10.5)
nRBC: 0 % (ref 0.0–0.2)

## 2020-06-07 LAB — COOXEMETRY PANEL
Carboxyhemoglobin: 1.8 % — ABNORMAL HIGH (ref 0.5–1.5)
Methemoglobin: 0.7 % (ref 0.0–1.5)
O2 Saturation: 77.4 %
Total hemoglobin: 9.8 g/dL — ABNORMAL LOW (ref 12.0–16.0)

## 2020-06-07 LAB — CORTISOL: Cortisol, Plasma: 50.1 ug/dL

## 2020-06-07 LAB — LACTIC ACID, PLASMA: Lactic Acid, Venous: 1.9 mmol/L (ref 0.5–1.9)

## 2020-06-07 LAB — PHOSPHORUS
Phosphorus: 1.5 mg/dL — ABNORMAL LOW (ref 2.5–4.6)
Phosphorus: 3.5 mg/dL (ref 2.5–4.6)

## 2020-06-07 LAB — LACTATE DEHYDROGENASE: LDH: 236 U/L — ABNORMAL HIGH (ref 98–192)

## 2020-06-07 MED ORDER — SODIUM CHLORIDE 0.9 % IV SOLN: 250.0000 mL | INTRAVENOUS | Status: DC

## 2020-06-07 MED ORDER — POTASSIUM CHLORIDE 10 MEQ/50ML IV SOLN
10.0000 meq | INTRAVENOUS | Status: AC
  Administered 2020-06-07 (×4): 10 meq via INTRAVENOUS
  Filled 2020-06-07 (×4): qty 50

## 2020-06-07 MED ORDER — SODIUM CHLORIDE 0.9% FLUSH
10.0000 mL | Freq: Two times a day (BID) | INTRAVENOUS | Status: DC
  Administered 2020-06-07 – 2020-06-13 (×8): 10 mL
  Administered 2020-06-13: 20 mL
  Administered 2020-06-14: 10 mL

## 2020-06-07 MED ORDER — PERFLUTREN LIPID MICROSPHERE
1.0000 mL | INTRAVENOUS | Status: AC | PRN
  Administered 2020-06-07: 4 mL via INTRAVENOUS
  Filled 2020-06-07: qty 10

## 2020-06-07 MED ORDER — PHENYLEPHRINE CONCENTRATED 100MG/250ML (0.4 MG/ML) INFUSION SIMPLE: 25.0000 ug/min | INTRAVENOUS | Status: DC

## 2020-06-07 MED ORDER — POTASSIUM PHOSPHATES 15 MMOLE/5ML IV SOLN
30.0000 mmol | Freq: Once | INTRAVENOUS | Status: AC
  Administered 2020-06-07: 30 mmol via INTRAVENOUS
  Filled 2020-06-07: qty 10

## 2020-06-07 MED ORDER — INSULIN GLARGINE 100 UNIT/ML ~~LOC~~ SOLN
5.0000 [IU] | Freq: Every day | SUBCUTANEOUS | Status: DC
  Administered 2020-06-07 – 2020-06-16 (×10): 5 [IU] via SUBCUTANEOUS
  Filled 2020-06-07 (×10): qty 0.05

## 2020-06-07 MED ORDER — INSULIN GLARGINE 100 UNIT/ML ~~LOC~~ SOLN: 5.0000 [IU] | Freq: Every day | SUBCUTANEOUS | Status: DC

## 2020-06-07 MED ORDER — MAGNESIUM SULFATE 2 GM/50ML IV SOLN
2.0000 g | Freq: Once | INTRAVENOUS | Status: AC
  Administered 2020-06-07: 2 g via INTRAVENOUS
  Filled 2020-06-07: qty 50

## 2020-06-07 MED ORDER — LIP MEDEX EX OINT
TOPICAL_OINTMENT | CUTANEOUS | Status: DC | PRN
  Filled 2020-06-07: qty 7

## 2020-06-07 MED ORDER — PHENYLEPHRINE HCL-NACL 10-0.9 MG/250ML-% IV SOLN
25.0000 ug/min | INTRAVENOUS | Status: DC
  Administered 2020-06-07: 115 ug/min via INTRAVENOUS
  Administered 2020-06-07: 25 ug/min via INTRAVENOUS
  Administered 2020-06-07: 95 ug/min via INTRAVENOUS
  Administered 2020-06-07 (×2): 125 ug/min via INTRAVENOUS
  Filled 2020-06-07 (×5): qty 250

## 2020-06-07 MED ORDER — PHENYLEPHRINE CONCENTRATED 100MG/250ML (0.4 MG/ML) INFUSION SIMPLE
0.0000 ug/min | INTRAVENOUS | Status: AC
  Administered 2020-06-07: 105 ug/min via INTRAVENOUS
  Filled 2020-06-07 (×2): qty 250

## 2020-06-07 MED ORDER — HYDROCORTISONE NA SUCCINATE PF 100 MG IJ SOLR
50.0000 mg | Freq: Four times a day (QID) | INTRAMUSCULAR | Status: DC
  Administered 2020-06-07: 50 mg via INTRAVENOUS
  Filled 2020-06-07: qty 2

## 2020-06-07 MED ORDER — SODIUM CHLORIDE 0.9 % IV SOLN: INTRAVENOUS | Status: DC

## 2020-06-07 MED ORDER — NOREPINEPHRINE 4 MG/250ML-% IV SOLN
0.0000 ug/min | INTRAVENOUS | Status: DC
  Administered 2020-06-07: 2 ug/min via INTRAVENOUS
  Administered 2020-06-08: 4 ug/min via INTRAVENOUS
  Filled 2020-06-07 (×3): qty 250

## 2020-06-07 MED ORDER — SODIUM CHLORIDE 0.9 % IV BOLUS
1000.0000 mL | Freq: Once | INTRAVENOUS | Status: AC
  Administered 2020-06-07: 1000 mL via INTRAVENOUS

## 2020-06-07 MED ORDER — POTASSIUM CHLORIDE 10 MEQ/100ML IV SOLN: 10.0000 meq | INTRAVENOUS | Status: DC

## 2020-06-07 MED ORDER — ENOXAPARIN SODIUM 40 MG/0.4ML ~~LOC~~ SOLN
40.0000 mg | SUBCUTANEOUS | Status: DC
  Administered 2020-06-08: 40 mg via SUBCUTANEOUS
  Filled 2020-06-07: qty 0.4

## 2020-06-07 MED ORDER — CALCIUM GLUCONATE-NACL 1-0.675 GM/50ML-% IV SOLN
1.0000 g | Freq: Once | INTRAVENOUS | Status: AC
  Administered 2020-06-07: 1000 mg via INTRAVENOUS
  Filled 2020-06-07: qty 50

## 2020-06-07 MED ORDER — SODIUM CHLORIDE 0.9% FLUSH
10.0000 mL | INTRAVENOUS | Status: DC | PRN
  Administered 2020-06-16: 10 mL

## 2020-06-07 MED FILL — Phenylephrine HCl IV Soln 10 MG/ML: INTRAVENOUS | Qty: 100 | Status: AC

## 2020-06-07 MED FILL — Sodium Chloride IV Soln 0.9%: INTRAVENOUS | Qty: 250 | Status: AC

## 2020-06-07 NOTE — Progress Notes (Signed)
Discussed with Noe Gens, NP with critical care.  Patient now on pressors. Critical care assuming primary management.  Will sign off. Appreciate PCCM.  Murray Hodgkins, MD  No charge note.

## 2020-06-07 NOTE — Progress Notes (Signed)
Right lower extremity venous duplex has been completed. Preliminary results can be found in CV Proc through chart review.   06/07/20 2:24 PM Tony Marshall RVT

## 2020-06-07 NOTE — Progress Notes (Signed)
[facility] Progress Note Patient Name: Tony Marshall DOB: [birth year] MRN: [id number]   Date of Service  06/07/2020  HPI/Events of Note  Hypotension - BP = 63/39 with MAP = 46. Sat = 100% on HFNC and RR = 19.  eICU Interventions  Plan: 1. Bolus with 0.9 NaCl 1 liter IV over 1 hour now. 2. Phenylephrine IV infusion via PIC. Titrate to MAP >= 65.      Intervention Category Major Interventions: Hypotension - evaluation and management  Adriana Quinby [place] 06/07/2020, 2:50 AM

## 2020-06-07 NOTE — Progress Notes (Signed)
Peripherally Inserted Central Catheter Placement  The IV Nurse has discussed with the patient and/or persons authorized to consent for the patient, the purpose of this procedure and the potential benefits and risks involved with this procedure.  The benefits include less needle sticks, lab draws from the catheter, and the patient may be discharged home with the catheter. Risks include, but not limited to, infection, bleeding, blood clot (thrombus formation), and puncture of an artery; nerve damage and irregular heartbeat and possibility to perform a PICC exchange if needed/ordered by physician.  Alternatives to this procedure were also discussed.  Bard Power PICC patient education guide, fact sheet on infection prevention and patient information card has been provided to patient /or left at bedside.    PICC Placement Documentation  PICC Triple Lumen 53/29/92 PICC Right Basilic 37 cm 0 cm (Active)  Indication for Insertion or Continuance of Line Vasoactive infusions 06/07/20 1200  Exposed Catheter (cm) 0 cm 06/07/20 1200  Site Assessment Clean;Dry;Intact 06/07/20 1200  Lumen #1 Status Flushed;Saline locked;Blood return noted 06/07/20 1200  Lumen #2 Status Flushed;Saline locked;Blood return noted 06/07/20 1200  Lumen #3 Status Flushed;Saline locked;Blood return noted 06/07/20 1200  Dressing Type Transparent;Securing device 06/07/20 1200  Dressing Status Clean;Dry;Intact;Antimicrobial disc in place 06/07/20 1200  [place] checked and tightened 06/07/20 1200  Dressing Intervention New dressing 06/07/20 1200  Dressing Change Due 06/14/20 06/07/20 1200       Virgilio Belling 06/07/2020, 12:20 PM

## 2020-06-07 NOTE — Progress Notes (Signed)
Called to bedside to assess RLE.  Family reports pt "got his leg hung in the rail in the ER".  He reports lower extremity soreness.  On exam, slight erythema to posterior lower calf and medial aspect of RLE near knee.  No warmth or abrasions / open skin.  RLE slightly larger than left.   Plan: -assess RLE venous duplex to rule out DVT   Noe Gens, MSN, NP-C [facility] 06/07/2020, 12:47 PM   Please see [email or web address] for pager details.

## 2020-06-07 NOTE — Progress Notes (Addendum)
NAME:  Tony Marshall, MRN:  [id number], DOB:  [birth year], LOS: 2 ADMISSION DATE:  06/05/2020, CONSULTATION DATE:  06/06/2020 REFERRING MD: Nada Libman MD , CHIEF COMPLAINT:  Sepsis  Brief History   79 year old with CLL on Rituxan, [place], diabetes, hypertension, hyperlipidemia admitted with diarrhea, malaise, mild DKA requiring insulin drip.  Labs significant for pancytopenia, mild hyperbilirubinemia, electrolyte abnormalities.  Transferred to ICU on 6/22 for hypotension, sepsis with elevated lactic acid. PCCM consulted for help with management.  Past Medical History  CLL - dx 2014, currently on Rituximab + Bendeka, last treatment 05/30/20 DM  GERD Hiatal Hernia  Schatzki's Ring  Colon Polyps  HTN  HLD  OSA  Significant Hospital Events   6/21 Admit 6/22 Rapid response, transfer to ICU  Consults:  PCCM  Procedures:    Significant Diagnostic Tests:  CT Head 6/21 >> no acute abnormality   Micro Data:  COVID 6/21 >> negative  C-Diff 6/23 >> negative  BCx2 6/22 >>   Antimicrobials:  Vancomycin 6/22 >>  Cefepime 6/22 >>  Interim history/subjective:  On room air  Glucose range 80-130 Afebrile / WBC 1.9 (tmax 6/22 102.4) I/O 400 ml UOP, +2.8L in last 24 hours Wife at bedside, reports pt more alert today than yesterday.  Denies known blood in stool.  Pt reports he feels weak  Objective   Blood pressure (!) 80/52, pulse 83, temperature (!) 96.9 F (36.1 C), temperature source Axillary, resp. rate 20, height 5' 8.5" (1.74 m), weight 59 kg, SpO2 100 %.        Intake/Output Summary (Last 24 hours) at 06/07/2020 0810 Last data filed at 06/07/2020 0645 Gross per 24 hour  Intake 3206.92 ml  Output 400 ml  Net 2806.92 ml   Filed Weights   06/05/20 1451 06/06/20 1734  Weight: 54.9 kg 59 kg    Examination: General: ill appearing elderly adult male lying in bed  HEENT: MM pale, dry. No jvd. Anicteric, pupils equal / reactive  Neuro: generalized weakness, awake /  interactive, slow to respond but oriented CV: s1s2 rrr, no m/r/g PULM:  Non-labored, clear bilaterally  GI: soft, bsx4 active  Extremities: warm/dry, no edema  Skin: no rashes or lesions  PCXR 6/23 >> images personally reviewed, no acute process  Resolved Hospital Problem list     Assessment & Plan:   Suspected Septic Shock  Unclear source, unknown organism.  Sepsis present on admission.  Could have an abdominal source with diarrhea on presentation which is thought to be chemo related, C-diff negative. Additional component of anemia.  -assess cortisol  -begin stress dose steroids  -neosynephrine gtt for MAP >65 -await CT chest, abd, pelvis  -continue empiric abx as above  -once IV placed assess SvO2 to ensure no chemo related cardiac effects  -trend lactic acid, PCT  -continue LR at 100 ml/hr for now  -place PICC line, appreciate IV team -assess ECHO, r/o cardiotoxicity from chemo  Elevated Glucose, Mild DKA Present on admit -SSI, sensitive scale  -lantus 10 unit SQ QD  AKI AGMA Hypokalemia Hypomagnesemia  Hypophosphatemia  Hypocalcemia (corrected for albumin 7.3) -Trend BMP / urinary output -Replace electrolytes as indicated, KCL, Mg, Ca+ 6/23  -Avoid nephrotoxic agents, ensure adequate renal perfusion  Pancytopenia CLL Secondary to CLL, chemotherapy -transfuse for Hgb <7% or active bleeding, PRBCx1 6/23  -continue granix 300 mcg QD -reschedule lovenox for 6/24, follow blood counts, may need to d/c & use SCD's -assess FOBT -assess LDH, haptoglobin  Elevated LFTs  May  be secondary to sepsis -review CT Abd/Pelvis when able to complete -follow LFT's   Severe Protein Calorie Malnutrition -nutrition consult  Acute Metabolic Encephalopathy  In setting of sepsis  -monitor, supportive care   Goals of care Full Code - prior discussion 6/22, wife requests full code.   Best practice:  Diet: NPO Pain/Anxiety/Delirium protocol (if indicated): NA VAP protocol  (if indicated): NA DVT prophylaxis: Lovenix GI prophylaxis: PPI Glucose control: SSI, lantus Mobility: Bed Code Status: Full Family Communication: Patient and wife updated at bedside 6/23.  Disposition: ICU  Labs   CBC: Recent Labs  Lab 06/05/20 1230 06/05/20 1230 06/05/20 1518 06/06/20 0435 06/06/20 0947 06/06/20 1708 06/07/20 0614  WBC 4.1  --  2.4* 1.4* 2.1* 1.3* 1.9*  NEUTROABS 0.1*  --  0.1*  --  0.2*  --  0.4*  HGB 9.8*  --  9.4* 7.6* 7.7* 7.3* 6.2*  HCT 29.5*   < > 27.7* 22.8* 23.2* 21.5* 18.8*  MCV 96.1   < > 97.5 95.4 98.7 96.8 97.9  PLT 300  --  238 179 163 162 109*   < > = values in this interval not displayed.    Basic Metabolic Panel: Recent Labs  Lab 06/05/20 1230 06/05/20 1518 06/05/20 2339 06/06/20 0435 06/06/20 0947 06/06/20 1241 06/06/20 1708 06/07/20 0614  NA 133*   < > 135 134*  --  134* 133* 134*  K 3.4*   < > 2.7* 2.6*  --  3.4* 2.8* 2.7*  CL 91*   < > 99 99  --  100 102 124*  CO2 22   < > 25 23  --  24 22 19*  GLUCOSE 348*   < > 115* 134*  --  65* 114* 81  BUN 19   < > 17 16  --  19 23 22   CREATININE 1.14   < > 0.78 0.84  --  0.98 1.09 1.20  CALCIUM 9.0   < > 7.9* 7.8*  --  7.8* 7.1* 5.2*  MG 1.5*  --   --   --  1.5*  --  2.1 1.6*  PHOS  --   --   --   --   --   --   --  1.5*   < > = values in this interval not displayed.   GFR: Estimated Creatinine Clearance: 43.7 mL/min (by C-G formula based on SCr of 1.2 mg/dL). Recent Labs  Lab 06/06/20 0435 06/06/20 0947 06/06/20 1241 06/06/20 1444 06/06/20 1708 06/07/20 0614  WBC 1.4* 2.1*  --   --  1.3* 1.9*  LATICACIDVEN  --   --  3.5* 3.3*  --   --     Liver Function Tests: Recent Labs  Lab 06/05/20 1518 06/06/20 0435 06/06/20 1241 06/06/20 1708 06/07/20 0614  AST 18 26 39 36 87*  ALT 13 12 14 14  67*  ALKPHOS 47 39 41 37* 35*  BILITOT 2.6* 1.7* 2.0* 1.7* 1.7*  PROT 5.7* 4.6* 4.6* 3.9* 3.5*  ALBUMIN 3.3* 2.7* 2.5* 2.0* 1.4*   No results for input(s): LIPASE, AMYLASE in the  last 168 hours. Recent Labs  Lab 06/06/20 0435  AMMONIA 20    ABG    Component Value Date/Time   PHART 7.491 (H) 06/07/2020 0355   PCO2ART 27.9 (L) 06/07/2020 0355   PO2ART 308 (H) 06/07/2020 0355   HCO3 21.2 06/07/2020 0355   TCO2 25 11/29/2007 1303   ACIDBASEDEF 1.5 06/07/2020 0355   O2SAT 100.0 06/07/2020 0355  Coagulation Profile: No results for input(s): INR, PROTIME in the last 168 hours.  Cardiac Enzymes: No results for input(s): CKTOTAL, CKMB, CKMBINDEX, TROPONINI in the last 168 hours.  HbA1C: Hgb A1c MFr Bld  Date/Time Value Ref Range Status  06/06/2020 09:47 AM 7.7 (H) 4.8 - 5.6 % Final    Comment:    (NOTE)         Prediabetes: 5.7 - 6.4         Diabetes: >6.4         Glycemic control for adults with diabetes: <7.0   03/20/2020 02:03 PM 4.7 4.6 - 6.5 % Final    Comment:    Glycemic Control Guidelines for People with Diabetes:Non Diabetic:  <6%Goal of Therapy: <7%Additional Action Suggested:  >8%     CBG: Recent Labs  Lab 06/06/20 0542 06/06/20 1444 06/06/20 2122 06/07/20 0033 06/07/20 0319  GLUCAP 128* 79 112* 111* 105*     Critical care time: 88 minutes     Noe Gens, MSN, NP-C [facility] 06/07/2020, 8:10 AM   Please see [email or web address] for pager details.

## 2020-06-07 NOTE — Plan of Care (Signed)
  Problem: Education: Goal: Knowledge of General Education information will improve Description: Including pain rating scale, medication(s)/side effects and non-pharmacologic comfort measures Outcome: Not Progressing   Problem: Health Behavior/Discharge Planning: Goal: Ability to manage health-related needs will improve Outcome: Not Progressing   Problem: Nutrition: Goal: Adequate nutrition will be maintained Outcome: Not Progressing   Problem: Pain Managment: Goal: General experience of comfort will improve Outcome: Not Progressing   Problem: Nutritional: Goal: Maintenance of adequate nutrition will improve Outcome: Not Progressing

## 2020-06-07 NOTE — Progress Notes (Signed)
CRITICAL VALUE ALERT  Critical Value:  Hemoglobin 6.2  Date & Time Notied:  06/07/20 @ 0620  Provider Notified: E-Link Nurse  Orders Received/Actions taken: Patient already has blood ready for transfusion.

## 2020-06-07 NOTE — Progress Notes (Signed)
  Echocardiogram 2D Echocardiogram has been performed.  Michiel Cowboy 06/07/2020, 10:27 AM

## 2020-06-07 NOTE — Progress Notes (Signed)
CCM notified of increased redness on right leg- streaks reaching upper knee. RN marked location to monitor for further spread. No new orders at this time.

## 2020-06-08 ENCOUNTER — Ambulatory Visit: Payer: Medicare HMO | Admitting: Gastroenterology

## 2020-06-08 ENCOUNTER — Inpatient Hospital Stay (HOSPITAL_COMMUNITY): Payer: Medicare HMO

## 2020-06-08 DIAGNOSIS — I5041 Acute combined systolic (congestive) and diastolic (congestive) heart failure: Secondary | ICD-10-CM

## 2020-06-08 DIAGNOSIS — D61818 Other pancytopenia: Secondary | ICD-10-CM

## 2020-06-08 DIAGNOSIS — R4182 Altered mental status, unspecified: Secondary | ICD-10-CM

## 2020-06-08 DIAGNOSIS — R570 Cardiogenic shock: Secondary | ICD-10-CM

## 2020-06-08 DIAGNOSIS — E1165 Type 2 diabetes mellitus with hyperglycemia: Secondary | ICD-10-CM

## 2020-06-08 LAB — TYPE AND SCREEN
ABO/RH(D): A POS
Antibody Screen: POSITIVE
DAT, IgG: POSITIVE
Unit division: 0
Unit division: 0

## 2020-06-08 LAB — GLUCOSE, CAPILLARY
Glucose-Capillary: 114 mg/dL — ABNORMAL HIGH (ref 70–99)
Glucose-Capillary: 120 mg/dL — ABNORMAL HIGH (ref 70–99)
Glucose-Capillary: 124 mg/dL — ABNORMAL HIGH (ref 70–99)
Glucose-Capillary: 142 mg/dL — ABNORMAL HIGH (ref 70–99)
Glucose-Capillary: 157 mg/dL — ABNORMAL HIGH (ref 70–99)
Glucose-Capillary: 93 mg/dL (ref 70–99)

## 2020-06-08 LAB — COMPREHENSIVE METABOLIC PANEL
ALT: 102 U/L — ABNORMAL HIGH (ref 0–44)
AST: 61 U/L — ABNORMAL HIGH (ref 15–41)
Albumin: 1.9 g/dL — ABNORMAL LOW (ref 3.5–5.0)
Alkaline Phosphatase: 46 U/L (ref 38–126)
Anion gap: 8 (ref 5–15)
BUN: 39 mg/dL — ABNORMAL HIGH (ref 8–23)
CO2: 17 mmol/L — ABNORMAL LOW (ref 22–32)
Calcium: 6.8 mg/dL — ABNORMAL LOW (ref 8.9–10.3)
Chloride: 107 mmol/L (ref 98–111)
Creatinine, Ser: 1.12 mg/dL (ref 0.61–1.24)
GFR calc Af Amer: >60 mL/min (ref >60)
GFR calc non Af Amer: >60 mL/min (ref >60)
Glucose, Bld: 137 mg/dL — ABNORMAL HIGH (ref 70–99)
Potassium: 3.9 mmol/L (ref 3.5–5.1)
Sodium: 132 mmol/L — ABNORMAL LOW (ref 135–145)
Total Bilirubin: 2 mg/dL — ABNORMAL HIGH (ref 0.3–1.2)
Total Protein: 4.1 g/dL — ABNORMAL LOW (ref 6.5–8.1)

## 2020-06-08 LAB — URINE CULTURE: Culture: NO GROWTH

## 2020-06-08 LAB — COOXEMETRY PANEL
Carboxyhemoglobin: 0.8 % (ref 0.5–1.5)
Methemoglobin: 0.6 % (ref 0.0–1.5)
O2 Saturation: 99 %
Total hemoglobin: 11 g/dL — ABNORMAL LOW (ref 12.0–16.0)

## 2020-06-08 LAB — CBC WITH DIFFERENTIAL/PLATELET
Abs Immature Granulocytes: 0.11 10*3/uL — ABNORMAL HIGH (ref 0.00–0.07)
Basophils Absolute: 0 10*3/uL (ref 0.0–0.1)
Basophils Relative: 1 %
Eosinophils Absolute: 0 10*3/uL (ref 0.0–0.5)
Eosinophils Relative: 0 %
HCT: 32.3 % — ABNORMAL LOW (ref 39.0–52.0)
Hemoglobin: 11 g/dL — ABNORMAL LOW (ref 13.0–17.0)
Immature Granulocytes: 5 %
Lymphocytes Relative: 65 %
Lymphs Abs: 1.4 10*3/uL (ref 0.7–4.0)
MCH: 31.3 pg (ref 26.0–34.0)
MCHC: 34.1 g/dL (ref 30.0–36.0)
MCV: 91.8 fL (ref 80.0–100.0)
Monocytes Absolute: 0.2 10*3/uL (ref 0.1–1.0)
Monocytes Relative: 8 %
Neutro Abs: 0.5 10*3/uL — ABNORMAL LOW (ref 1.7–7.7)
Neutrophils Relative %: 21 %
Platelets: 106 10*3/uL — ABNORMAL LOW (ref 150–400)
RBC: 3.52 MIL/uL — ABNORMAL LOW (ref 4.22–5.81)
RDW: 18.7 % — ABNORMAL HIGH (ref 11.5–15.5)
WBC: 2.2 10*3/uL — ABNORMAL LOW (ref 4.0–10.5)
nRBC: 0 % (ref 0.0–0.2)

## 2020-06-08 LAB — T4, FREE: Free T4: 1.09 ng/dL (ref 0.61–1.12)

## 2020-06-08 LAB — BPAM RBC
Blood Product Expiration Date: 202107222359
Blood Product Expiration Date: 202108012359
ISSUE DATE / TIME: 202106230647
ISSUE DATE / TIME: 202106231419
Unit Type and Rh: 600
Unit Type and Rh: 6200

## 2020-06-08 NOTE — Progress Notes (Addendum)
NAME:  Tony Marshall, MRN:  [id number], DOB:  1941-06-18, LOS: 3 ADMISSION DATE:  06/05/2020, CONSULTATION DATE:  06/06/2020 REFERRING MD: Nada Libman MD , CHIEF COMPLAINT:  Sepsis  Brief History   79 year old with CLL on Rituxan, Bend[place], diabetes, hypertension, hyperlipidemia admitted with diarrhea, malaise, mild DKA requiring insulin drip.  Labs significant for pancytopenia, mild hyperbilirubinemia, electrolyte abnormalities.  Transferred to ICU on 6/22 for hypotension, sepsis with elevated lactic acid. PCCM consulted for help with management.  Past Medical History  CLL - dx 2014, currently on Rituximab + Bendeka, last treatment 05/30/20 DM  GERD Hiatal Hernia  Schatzki's Ring  Colon Polyps  HTN  HLD  OSA  Significant Hospital Events   6/21 Admit 6/22 Rapid response, transfer to ICU 6/23 ECHO with new reduction of LVEF to 25-30%  Consults:  PCCM  Procedures:    Significant Diagnostic Tests:  CT Head 6/21 >> no acute abnormality  ECHO 6/23 >> LVEF 25-30%, LV with severely decreased function, Grade II diastolic dysfunction. RV systolic function is normal, normal pulmonary artery systolic pressure.   CT Chest, ABD, Pelvis 6/23 >> new small bilateral pleural effusions, ascites and generalized edema throughout the intra-abdominal and SQ fat consistent with anasarca, new symmetric perinephric soft tissue stranding bilaterally without focal fluid collection, probable atelectasis at both lung bases with adjacent pleural effusions, no evidence of PNA, stable mildly enlarged retroperitoneal & left pelvic lymph nodes, no significant residual splenomegaly  Micro Data:  COVID 6/21 >> negative  C-Diff 6/23 >> negative  UC 6/22 >> negative  BCx2 6/22 >>   Antimicrobials:  Vancomycin 6/22 >>  Cefepime 6/22 >>  Interim history/subjective:  Remains on RA  Glucose range 120-153 Afebrile / WBC up to 2.2  UOP 250 ml, + 4.7L in last 24 hours Levophed at 31mcg/min Wife at bedside,  concerned for possible cellulitis of RLE   Objective   Blood pressure 97/63, pulse 77, temperature (!) 96.1 F (35.6 C), temperature source Axillary, resp. rate 19, height 5' 8.5" (1.74 m), weight 67.8 kg, SpO2 100 %.        Intake/Output Summary (Last 24 hours) at 06/08/2020 0917 Last data filed at 06/08/2020 0600 Gross per 24 hour  Intake 2368.31 ml  Output 250 ml  Net 2118.31 ml   Filed Weights   06/05/20 1451 06/06/20 1734 06/08/20 0500  Weight: 54.9 kg 59 kg 67.8 kg    Examination: General: elderly male lying in bed in NAD HEENT: MM pink/dry, anicteric  Neuro: Awake, alert, oriented to self/place, ?low level delirium with picking at covers etc CV: s1s2 rrr, no m/r/g PULM: non-labored, lungs bilaterally without wheeze, clear anterior GI: soft, bsx4 active  Extremities: warm/dry, RLE 1+ edema  Skin: RLE with slight erythema, improved from 6/23 exam  Resolved Hospital Problem list     Assessment & Plan:   Shock - Mixed cardiogenic, +/- septic component, & hypovolemic  Unclear source, no known organism.  Sepsis physiology present on admission.  Could have an abdominal source with diarrhea on presentation which is thought to be chemo related, C-diff negative. Additional component of anemia / hypovolemia and new reduction of LVEF. CT abd without clear source infection.  Note bilateral perinephric soft tissue stranding, UA + UC negative, suspect non-specific.   -ICU monitoring  -wean levophed for MAP >65  -appreciate Cardiology evaluation  -trend SvO2   Elevated Glucose, Mild DKA Present on admit -SSI, sensitive scale  -lantus 5 units QD   AKI AGMA Hypokalemia Hypomagnesemia  Hypophosphatemia  Hypocalcemia (corrected for albumin 7.3) -Trend BMP / urinary output -Replace electrolytes as indicated -Avoid nephrotoxic agents, ensure adequate renal perfusion  Pancytopenia CLL Secondary to CLL, chemotherapy.  PRBCx1 on 6/23 -trend CBC, transfuse for Hgb <7% -granix  300 mcg QD -continue lovenox for DVT, monitor for bleeding  -continue folic acid, MVI, thiamine  -await FOBT   Elevated LFTs  May be secondary to sepsis -CT as above   Severe Protein Calorie Malnutrition -nutrition consult   Acute Metabolic Encephalopathy  In setting of sepsis  -monitor, supportive care    Goals of care Full Code - prior discussion 6/22, wife requests full code.   Best practice:  Diet: NPO Pain/Anxiety/Delirium protocol (if indicated): NA VAP protocol (if indicated): NA DVT prophylaxis: Lovenix GI prophylaxis: PPI Glucose control: SSI, lantus Mobility: Bed Code Status: Full Family Communication: Patient and wife updated at bedside 6/24  Disposition: ICU  Labs   CBC: Recent Labs  Lab 06/05/20 1230 06/05/20 1230 06/05/20 1518 06/06/20 0435 06/06/20 0947 06/06/20 1708 06/07/20 0614 06/07/20 2000 06/08/20 0434  WBC 4.1  --  2.4*   < > 2.1* 1.3* 1.9* 2.9* 2.2*  NEUTROABS 0.1*  --  0.1*  --  0.2*  --  0.4*  --  0.5*  HGB 9.8*  --  9.4*   < > 7.7* 7.3* 6.2* 11.2* 11.0*  HCT 29.5*   < > 27.7*   < > 23.2* 21.5* 18.8* 33.5* 32.3*  MCV 96.1   < > 97.5   < > 98.7 96.8 97.9 92.8 91.8  PLT 300  --  238   < > 163 162 109* 119* 106*   < > = values in this interval not displayed.    Basic Metabolic Panel: Recent Labs  Lab 06/05/20 1230 06/05/20 1518 06/06/20 0435 06/06/20 0947 06/06/20 1241 06/06/20 1708 06/07/20 0614 06/07/20 2000 06/08/20 0434  NA 133*   < >   < >  --  134* 133* 134* 134* 132*  K 3.4*   < >   < >  --  3.4* 2.8* 2.7* 4.3 3.9  CL 91*   < >   < >  --  100 102 124* 105 107  CO2 22   < >   < >  --  24 22 19* 19* 17*  GLUCOSE 348*   < >   < >  --  65* 114* 81 159* 137*  BUN 19   < >   < >  --  19 23 22  34* 39*  CREATININE 1.14   < >   < >  --  0.98 1.09 1.20 1.14 1.12  CALCIUM 9.0   < >   < >  --  7.8* 7.1* 5.2* 6.9* 6.8*  MG 1.5*  --   --  1.5*  --  2.1 1.6* 2.5*  --   PHOS  --   --   --   --   --   --  1.5* 3.5  --    < > =  values in this interval not displayed.   GFR: Estimated Creatinine Clearance: 53.8 mL/min (by C-G formula based on SCr of 1.12 mg/dL). Recent Labs  Lab 06/06/20 0947 06/06/20 1241 06/06/20 1444 06/06/20 1708 06/07/20 0614 06/07/20 1300 06/07/20 2000 06/08/20 0434  WBC   < >  --   --  1.3* 1.9*  --  2.9* 2.2*  LATICACIDVEN  --  3.5* 3.3*  --   --  1.9  --   --    < > = values in this interval not displayed.    Liver Function Tests: Recent Labs  Lab 06/06/20 0435 06/06/20 1241 06/06/20 1708 06/07/20 0614 06/08/20 0434  AST 26 39 36 87* 61*  ALT 12 14 14  67* 102*  ALKPHOS 39 41 37* 35* 46  BILITOT 1.7* 2.0* 1.7* 1.7* 2.0*  PROT 4.6* 4.6* 3.9* 3.5* 4.1*  ALBUMIN 2.7* 2.5* 2.0* 1.4* 1.9*   No results for input(s): LIPASE, AMYLASE in the last 168 hours. Recent Labs  Lab 06/06/20 0435  AMMONIA 20    ABG    Component Value Date/Time   PHART 7.491 (H) 06/07/2020 0355   PCO2ART 27.9 (L) 06/07/2020 0355   PO2ART 308 (H) 06/07/2020 0355   HCO3 21.2 06/07/2020 0355   TCO2 25 11/29/2007 1303   ACIDBASEDEF 1.5 06/07/2020 0355   O2SAT 77.4 06/07/2020 1200     Coagulation Profile: No results for input(s): INR, PROTIME in the last 168 hours.  Cardiac Enzymes: No results for input(s): CKTOTAL, CKMB, CKMBINDEX, TROPONINI in the last 168 hours.  HbA1C: Hgb A1c MFr Bld  Date/Time Value Ref Range Status  06/06/2020 09:47 AM 7.7 (H) 4.8 - 5.6 % Final    Comment:    (NOTE)         Prediabetes: 5.7 - 6.4         Diabetes: >6.4         Glycemic control for adults with diabetes: <7.0   03/20/2020 02:03 PM 4.7 4.6 - 6.5 % Final    Comment:    Glycemic Control Guidelines for People with Diabetes:Non Diabetic:  <6%Goal of Therapy: <7%Additional Action Suggested:  >8%     CBG: Recent Labs  Lab 06/07/20 1618 06/07/20 2013 06/07/20 2342 06/08/20 0326 06/08/20 0809  GLUCAP 155* 147* 153* 124* 120*     Critical care time: 30 minutes     Noe Gens, MSN,  NP-C [facility] 06/08/2020, 9:17 AM   Please see [email or web address] for pager details.

## 2020-06-08 NOTE — Progress Notes (Addendum)
Nickel size area assessed on left inner lower buttock cheek, area reddened with brown middle.  Area not over bony prominence Wound consult placed for pressure injury vs MASD. Pt stated that he had a sore area there prior to admission d/t diarrhea.

## 2020-06-08 NOTE — Progress Notes (Signed)
Nutrition Follow-up  DOCUMENTATION CODES:   Non-severe (moderate) malnutrition in context of chronic illness  INTERVENTION:  - continue [employer] BID, vanilla flavor only.  - continue 2 scoops beneprotein TID.  NUTRITION DIAGNOSIS:   Moderate Malnutrition related to chronic illness, cancer and cancer related treatments as evidenced by mild fat depletion, moderate fat depletion, moderate muscle depletion. -revised  GOAL:   Patient will meet greater than or equal to 90% of their needs -unmet  MONITOR:   PO intake, Supplement acceptance, Labs, Weight trends  ASSESSMENT:   79 y.o. male with medical hx of type 2 DM, diverticulosis, GERD, HLD, HTN, and CLL diagnosed 09/2013 undergoing active treatment with bendamustine and Rituxan (last treatment: 7/48) complicated by recurrent episodes of fatigue, generalized weakness, weight loss, lightheadedness, and poor appetite. He was seen in the ED 6/1 with severe dehydration and a 17 pound weight loss but was able to be sent home after volume resuscitation. He had severe constipation a few days PTA and used OTC agents to treat which then led to refractory diarrhea. He has been refusing food and beverages and experiencing worsening weakness. He was transferred from the [place] to the ED due to severe AMS and significant hyperglycemia.  Diet changed from Carb Modified to NPO on 6/22 at 1810 and advanced to Heart Healthy/Carb Modified today at ~1100. Patient had not received a lunch tray as of RD visit earlier this afternoon. Patient laying in bed and his wife was at bedside. They both provided information.   [place] talked with patient's wife on the phone on 6/14 and saw patient in person on 6/15. Since that time, patient has had a poor appetite, taste alterations, and increased weakness. Patient was not drinking oral nutrition supplements as prescribed (2-3 times/day) at home d/t high sugar content and outpatient physicians telling him  not to drink them d/t persistent hyperglycemia.   He would often decline foods prepared for him, even items he usually eats well and that are favorites. He was feeling weaker and more tired than usual over the past 2 weeks. He was having taste changes of things tasting bland/like cardboard and metallic taste. Wife reports that she was provided with a booklet outlining tips for situations such as this but none of the tips helped.   Wife is very interested in ordering [place] for patient to have at home. He has not been drinking it here due to receiving plain, rather than vanilla, flavor.   Patient previously had thrush which has now resolved. He denies any pain with chewing or swallowing although he sometimes feels that big sips of liquids get momentarily stuck in his esophagus. He is having mild to moderate abdominal pain today, no nausea.    Labs reviewed; CBGs: 124 and 120 mg/dl, Na: 132 mmol/l, BUN: 39 mg/dl, Ca: 6.8 mg/dl, LFTs elevated. Medications reviewed; 1 mg folic acid/day, sliding sclae novolog, 5 units lantus/day, 1 tablet multivitamin with minerals, 40 mg IV protonix/day, 100 mg IV thiamine/day.    NUTRITION - FOCUSED PHYSICAL EXAM:    Most Recent Value  Orbital Region Mild depletion  Upper Arm Region Moderate depletion  Thoracic and Lumbar Region Unable to assess  Buccal Region Mild depletion  Temple Region Mild depletion  Clavicle Bone Region Moderate depletion  Clavicle and Acromion Bone Region Moderate depletion  Scapular Bone Region Unable to assess  Dorsal Hand Moderate depletion  Patellar Region Moderate depletion  Anterior Thigh Region Moderate depletion  Posterior Calf Region Moderate  depletion  Edema (RD Assessment) None  Hair Reviewed  Eyes Reviewed  Mouth Reviewed  Skin Reviewed  Nails Reviewed       Diet Order:   Diet Order            Diet heart healthy/carb modified Room service appropriate? Yes; Fluid consistency: Thin  Diet effective  now                 EDUCATION NEEDS:   No education needs have been identified at this time  Skin:  Skin Assessment: Reviewed RN Assessment  Last BM:  6/22  Height:   Ht Readings from Last 1 Encounters:  06/06/20 5' 8.5" (1.74 m)    Weight:   Wt Readings from Last 1 Encounters:  06/08/20 67.8 kg    Estimated Nutritional Needs:  Kcal:  1920-2200 kcal Protein:  100-115 grams Fluid:  >/= 2.2 L/day     Jarome Matin, MS, RD, LDN, CNSC Inpatient Clinical Dietitian RD pager # available in AMION  After hours/weekend pager # available in [facility]

## 2020-06-08 NOTE — Consult Note (Addendum)
Cardiology Consultation:   Patient ID: HANZ WINTERHALTER MRN: [id number]; DOB: [birth year]  Admit date: 06/05/2020 Date of Consult: 06/08/2020  Primary Care Provider: Elayne Snare, MD [place] Cardiologist: Remotely Dr. Angelena Form in 2015, new to Dr. Nicholes [facility] Electrophysiologist:  None    Patient Profile:   ABIMAEL ZEITER is a 79 y.o. male with a hx of CLL, DM, OSA, HTN, HLD, GERD, Schatzki's ring who is being seen today for the evaluation of heart failure at the request of Noe Gens, NP.  History of Present Illness:   He remotely saw Dr. Angelena Form in 2015 for [place]. Stress myoview 06/2014 showed good exercise time with no ischemia. Previous echo 2015 showed LVEF 55-60%, unremarkable otherwise.   He's been following with heme-onc for his CLL. His wife states in December he started to go downhill with decreased appetite and weakness. He was found to have decreasing Hgb and underwent upper endoscopy that was negative except for gastritis. He required blood transfusion and also was noted to have increase in his WBC. He also had developed posterior neck lymphadenopathy. He was felt to have reached the point of needing treatment for his CLL and in April he started current therapy of rituximab and [place]. His wife states in general he had been able to remain fairly active during the treatment so far with ADLs, limited by mild dyspnea requiring him to rest that they attributed to his overall deconditioning. His wife indicates habitual ETOH use, several beers per day but subjectively never excessive, which he discontinued when initiating treatment for his CLL.  He was seen in the ED 6/1 with severe dehydration and a 17 pound weight loss but was able to be sent home after volume resuscitation.  He recently called in 6/16 with constipation and no BM for 3 weeks. He had tried mag citrate and was advised to try milk of mag and Fleets enema if no result. He then presented to the office  06/05/20 with generalized weakness, fatigue, and altered mental status with confusion. He appeared cachectic and dehydrated. His wife indicated he had not even recognized her and had poor oral intake. He also had developed refractory diarrhea after treatment for his constipation. He had not been taking his DM or HTN medications. He was noted to be hyponatremic, hypokalemic, hypomagnesemic, febrile, in metabolic acidosis with anion gap and lactic acidosis, with AKI, decreased TSH, toxic metabolic encephalopathy, and chemo-induced neutropenia. Hgb was 6.2. Per heme-onc note, supportive care recommended since the patient overall had a very good prognosis from his CLL. He required transfer to the ICU on 06/06/20 due to septic shock (BP 69/45) with unclear source. He was treated with IV antibiotics and IV fluids. CT chest abdomen pelvis 6/23 showed bilateral pleural effusions with edema, perinephric soft tissue stranding, enlarged retroperitoneal and left pelvic lymph nodes. 2D echo was obtained which showed severe LV dysfunction with EF 25-30%, grade 2 DD, normal RV, so IV fluids were discontinued. Cardiology consulted for further recs. hsTroponin on admit was 11. CT chest yesterday does note diffuse atherosclerosis of aorta, great vessels and coronaries as well as small bilateral effusions, ascites and generalized edema. CXR today without pleural effusion, + low lung volumes, mild bibasilar atx.   He denies any recent CP. He is mentally much clearer today but still sleeping a lot. Requiring norepinephrine for BP which remains soft. His wife is also concerned about an focal tracking area of redness on his leg that started a day or so ago. He  was lying in bed about 15 degrees without any dyspnea. No edema noted on exam.    Past Medical History:  Diagnosis Date  . CLL (chronic lymphocytic leukemia) ([place]) dx'd 2014   progression 12/2019  . Diabetes mellitus without complication ([place])   . Diverticulosis   .  External hemorrhoids   . GERD (gastroesophageal reflux disease)   . Hiatal hernia   . History of colon polyps   . Hyperlipidemia   . Hypertension   . Internal hemorrhoids   . Lymphocytosis   . Schatzki's ring   . Sleep apnea     Past Surgical History:  Procedure Laterality Date  . BLEPHAROPLASTY    . COLONOSCOPY  2012  . HEMORRHOID SURGERY     Sclerotherapy  . KNEE SURGERY Right 2002  . POLYPECTOMY    . SHOULDER SURGERY Right 2007     Home Medications:  Prior to Admission medications   Medication Sig Start Date End Date Taking? Authorizing Provider  acetaminophen (TYLENOL) 500 MG tablet Take 500 mg by mouth every 6 (six) hours as needed for moderate pain.   Yes [provider]  allopurinol (ZYLOPRIM) 100 MG tablet Take 1 tablet (100 mg total) by mouth 2 (two) times daily. 03/28/20  Yes Curt Bears, MD  amLODipine (NORVASC) 5 MG tablet Take 1 tablet (5 mg total) by mouth daily. 03/27/20  Yes Elayne Snare, MD  aspirin 81 MG EC tablet Take 81 mg by mouth daily. Swallow whole.   Yes [provider]  benazepril (LOTENSIN) 20 MG tablet TAKE 1 TABLET BY MOUTH EVERY DAY 03/21/20  Yes Elayne Snare, MD  carvedilol (COREG) 25 MG tablet TAKE ONE TABLET BY MOUTH TWICE DAILY  02/10/20  Yes Elayne Snare, MD  doxazosin (CARDURA) 4 MG tablet Take 1/2 tablet (2mg  total) by mouth once daily. If systolic BP is below 161, stop taking. 05/22/20  Yes Elayne Snare, MD  dronabinol (MARINOL) 2.5 MG capsule Take 1 capsule (2.5 mg total) by mouth 2 (two) times daily before a meal. 05/30/20  Yes Heilingoetter, Cassandra L, PA-C  esomeprazole (NEXIUM) 40 MG capsule TAKE 1 CAPSULE BY MOUTH EVERY MORNING 30 MINUTES BEFORE BREAKFAST Patient taking differently: Take 40 mg by mouth daily.  04/28/20  Yes Pyrtle, Lajuan Lines, MD  glucagon (GLUCAGON EMERGENCY) 1 MG injection Inject 1 mg into the skin once as needed (For severe low blood sugar). 06/14/19  Yes Elayne Snare, MD  ibuprofen (ADVIL) 200 MG tablet Take  200 mg by mouth every 6 (six) hours as needed for headache or mild pain.   Yes [provider]  insulin aspart (NOVOLOG FLEXPEN) 100 UNIT/ML FlexPen Inject 3-4 Units into the skin 3 (three) times daily with meals. Inject 3-4 units under the skin three times daily before meals.    Yes [provider]  insulin degludec (TRESIBA FLEXTOUCH) 100 UNIT/ML SOPN FlexTouch Pen inject 12 units daily. if fasting blood sugars are over 140 increase dosage up to 15 units daily as directed. 03/22/19  Yes Elayne Snare, MD  loperamide (IMODIUM A-D) 2 MG tablet Take 2 mg by mouth 4 (four) times daily as needed for diarrhea or loose stools.   Yes [provider]  metFORMIN (GLUCOPHAGE-XR) 500 MG 24 hr tablet Take 1 tablet (500 mg total) by mouth daily with supper. 09/16/19  Yes Elayne Snare, MD  oxymetazoline (AFRIN) 0.05 % nasal spray Place 1 spray into both nostrils 2 (two) times daily as needed for congestion.   Yes [provider]  pioglitazone (ACTOS) 15 MG tablet TAKE 1 TABLET BY MOUTH EVERY DAY 03/21/20  Yes Elayne Snare, MD  potassium chloride SA (KLOR-CON) 20 MEQ tablet Take 1 tablet (20 mEq total) by mouth 2 (two) times daily. 05/12/20  Yes Tanner, Lyndon Code., PA-C  prochlorperazine (COMPAZINE) 10 MG tablet Take 1 tablet (10 mg total) by mouth every 6 (six) hours as needed for nausea or vomiting. 03/28/20  Yes Curt Bears, MD  rosuvastatin (CRESTOR) 5 MG tablet TAKE 1 TABLET BY MOUTH EVERY DAY 04/21/20  Yes Elayne Snare, MD  [facility] VERIO test strip USE 1 STRIP TO CHECK GLUCOSE THREE TIMES DAILY 02/24/20   Elayne Snare, MD    Inpatient Medications: Scheduled Meds: . acetaminophen  650 mg Oral Once  . Chlorhexidine Gluconate Cloth  6 each Topical Daily  . enoxaparin (LOVENOX) injection  40 mg Subcutaneous Q24H  . feeding supplement (KATE FARMS STANDARD 1.4)  325 mL Oral BID BM  . folic acid  1 mg Intravenous Daily  . insulin aspart  0-9 Units Subcutaneous TID WC  . insulin glargine   5 Units Subcutaneous Daily  . multivitamin with minerals  1 tablet Oral Daily  . pantoprazole (PROTONIX) IV  40 mg Intravenous Q24H  . protein supplement  12 g Oral TID WC  . sodium chloride flush  10-40 mL Intracatheter Q12H  . Tbo-filgrastim (GRANIX) injection [facility]  300 mcg Subcutaneous Daily  . thiamine injection  100 mg Intravenous Daily   Continuous Infusions: . sodium chloride    . ceFEPime (MAXIPIME) IV Stopped (06/08/20 0125)  . norepinephrine (LEVOPHED) Adult infusion 6 mcg/min (06/08/20 0600)  . vancomycin Stopped (06/08/20 0017)   PRN Meds: acetaminophen **OR** [DISCONTINUED] acetaminophen, dextrose, lip balm, LORazepam, naLOXone (NARCAN)  injection, ondansetron **OR** ondansetron (ZOFRAN) IV, oxymetazoline, promethazine, sodium chloride flush  Allergies:    Allergies  Allergen Reactions  . Glycopyrrolate Rash    Social History:   Social History   Socioeconomic History  . Marital status: Married    Spouse name: Not on file  . Number of children: 1  . Years of education: Not on file  . Highest education level: Not on file  Occupational History  . Occupation: Retail buyer: [employer]  Tobacco Use  . Smoking status: Never Smoker  . Smokeless tobacco: Former Systems developer    Types: Secondary school teacher  . Vaping Use: Never used  Substance and Sexual Activity  . Alcohol use: Yes    Alcohol/week: 6.0 standard drinks    Types: 6 Cans of beer per week  . Drug use: No  . Sexual activity: Yes  Other Topics Concern  . Not on file  Social History Narrative  . Not on file   Social Determinants of Health   Financial Resource Strain:   . Difficulty of Paying Living Expenses:   Food Insecurity:   . Worried About Charity fundraiser in the Last Year:   . Arboriculturist in the Last Year:   Transportation Needs:   . Film/video editor (Medical):   Marland Kitchen Lack of Transportation (Non-Medical):   Physical Activity:   . Days of Exercise per Week:   .  Minutes of Exercise per Session:   Stress:   . Feeling of Stress :   Social Connections:   . Frequency of Communication with Friends and Family:   . Frequency of Social Gatherings with Friends and Family:   . Attends Religious Services:   .  Active Member of Clubs or Organizations:   . Attends Archivist Meetings:   Marland Kitchen Marital Status:   Intimate Partner Violence:   . Fear of Current or Ex-Partner:   . Emotionally Abused:   Marland Kitchen Physically Abused:   . Sexually Abused:     Family History:    Family History  Problem Relation Age of Onset  . Kidney disease Mother   . CAD Maternal Aunt   . Colon cancer Neg Hx   . Esophageal cancer Neg Hx   . Rectal cancer Neg Hx   . Stomach cancer Neg Hx      ROS:  Please see the history of present illness.  All other ROS reviewed and negative.     Physical Exam/Data:   Vitals:   06/08/20 0500 06/08/20 0600 06/08/20 0700 06/08/20 0800  BP: 119/66 121/70 (!) 98/57 97/63  Pulse: 73 76 70 77  Resp: 19 (!) 23 18 19   Temp:    (!) 96.1 F (35.6 C)  TempSrc:    Axillary  SpO2: 100% 100% 100% 100%  Weight: 67.8 kg     Height:        Intake/Output Summary (Last 24 hours) at 06/08/2020 1000 Last data filed at 06/08/2020 0600 Gross per 24 hour  Intake 2368.31 ml  Output 250 ml  Net 2118.31 ml   Last 3 Weights 06/08/2020 06/06/2020 06/05/2020  Weight (lbs) 149 lb 7.6 oz 130 lb 1.1 oz 121 lb  Weight (kg) 67.8 kg 59 kg 54.885 kg     Body mass index is 22.4 kg/m.  Vital Signs. BP 95/68   Pulse 65   Temp (!) 96.1 F (35.6 C) (Axillary)   Resp 15   Ht 5' 8.5" (1.74 m)   Wt 67.8 kg   SpO2 100%   BMI 22.40 kg/m  General: Adult well developed WM in no acute distress. Head: Normocephalic, atraumatic, sclera non-icteric, no xanthomas, nares are without discharge. Neck: Negative for carotid bruits. JVP not elevated. Lungs: Clear bilaterally to auscultation without wheezes, rales, or rhonchi. Breathing is unlabored. Heart: RRR S1 S2  without murmurs, rubs, or gallops.  Abdomen: Soft, non-tender, non-distended with normoactive bowel sounds. No rebound/guarding. Extremities: No clubbing or cyanosis. No edema. Distal pedal pulses are 2+ and equal bilaterally. Focal area of redness snaking around right lower leg outlined with marker Neuro: Alert and oriented X 3. Moves all extremities spontaneously. Psych:  Responds to questions appropriately with a normal affect.  EKG:  The EKG was personally reviewed and demonstrates:  NSR 93bpm, anterolateral TWI V1-V4 as well as lead III, artifact noted, prolonged QTc 58ms - TWI new from 6/1  Telemetry:  Telemetry was personally reviewed and demonstrates: NSR, QTc 448ms  Relevant CV Studies: 2D echo 06/07/20  1. Left ventricular ejection fraction, by estimation, is 25 to 30%. The  left ventricle has severely decreased function. The left ventricle has no  regional wall motion abnormalities. Left ventricular diastolic parameters  are consistent with Grade II  diastolic dysfunction (pseudonormalization).  2. Right ventricular systolic function is normal. The right ventricular  size is normal. There is normal pulmonary artery systolic pressure.  3. The mitral valve is normal in structure. No evidence of mitral valve  regurgitation. No evidence of mitral stenosis.  4. The aortic valve is grossly normal. Aortic valve regurgitation is not  visualized. No aortic stenosis is present.  5. The inferior vena cava is normal in size with greater than 50%  respiratory variability, suggesting right  atrial pressure of 3 mmHg. Laboratory Data:  High Sensitivity Troponin:   Recent Labs  Lab 05/16/20 1303 05/16/20 1640 06/05/20 1710  TROPONINIHS 9 8 11      Chemistry Recent Labs  Lab 06/07/20 0614 06/07/20 2000 06/08/20 0434  NA 134* 134* 132*  K 2.7* 4.3 3.9  CL 124* 105 107  CO2 19* 19* 17*  GLUCOSE 81 159* 137*  BUN 22 34* 39*  CREATININE 1.20 1.14 1.12  CALCIUM 5.2* 6.9* 6.8*   GFRNONAA 58* >60 >60  GFRAA >60 >60 >60  ANIONGAP 0.0* 10 8    Recent Labs  Lab 06/06/20 1708 06/07/20 0614 06/08/20 0434  PROT 3.9* 3.5* 4.1*  ALBUMIN 2.0* 1.4* 1.9*  AST 36 87* 61*  ALT 14 67* 102*  ALKPHOS 37* 35* 46  BILITOT 1.7* 1.7* 2.0*   Hematology Recent Labs  Lab 06/07/20 0614 06/07/20 2000 06/08/20 0434  WBC 1.9* 2.9* 2.2*  RBC 1.92* 3.61* 3.52*  HGB 6.2* 11.2* 11.0*  HCT 18.8* 33.5* 32.3*  MCV 97.9 92.8 91.8  MCH 32.3 31.0 31.3  MCHC 33.0 33.4 34.1  RDW 17.3* 18.1* 18.7*  PLT 109* 119* 106*   BNPNo results for input(s): BNP, PROBNP in the last 168 hours.  DDimer No results for input(s): DDIMER in the last 168 hours.   Radiology/Studies:  CT ABDOMEN PELVIS WO CONTRAST  Result Date: 06/07/2020 CLINICAL DATA:  Respiratory failure and abdominal pain with fever. History of colonic polyps and diverticulosis. History of CLL. EXAM: CT CHEST, ABDOMEN AND PELVIS WITHOUT CONTRAST TECHNIQUE: Multidetector CT imaging of the chest, abdomen and pelvis was performed following the standard protocol without IV contrast. COMPARISON:  Chest CTA 05/16/2020.  Abdominopelvic CT 04/05/2020. FINDINGS: CT CHEST FINDINGS Cardiovascular: Right arm PICC extends to the superior cavoatrial junction. There is diffuse atherosclerosis of the aorta, great vessels and coronary arteries. The heart is mildly enlarged. No significant pericardial fluid. Mediastinum/Nodes: There are no enlarged mediastinal, hilar or axillary lymph nodes. The thyroid gland, trachea and esophagus demonstrate no significant findings. Lungs/Pleura: There are new small bilateral pleural effusions with dependent opacities in both lungs, probably atelectasis. These obscure the previously demonstrated nodular densities in both lower lobes. These were not present on the abdominal CT from April and were probably inflammatory. No consolidation or new abnormality identified. There is mild central airway thickening.  Musculoskeletal/Chest wall: No chest wall mass or suspicious osseous findings. CT ABDOMEN AND PELVIS FINDINGS Hepatobiliary: No focal hepatic abnormalities are identified on noncontrast imaging. The gallbladder is mildly distended without evidence of gallstones, wall thickening or focal surrounding inflammation. Pancreas: No focal pancreatic abnormality or surrounding inflammation identified. Spleen: Resolution of previously demonstrated splenomegaly. No focal abnormality identified. Adrenals/Urinary Tract: Both adrenal glands appear normal. Stable dominant cyst in the lower interpolar region of the right kidney. No evidence of urinary tract calculus or hydronephrosis. New symmetric perinephric soft tissue stranding bilaterally without focal fluid collection. The bladder is decompressed by a Foley catheter. There is a small amount of air in the bladder lumen. Possible mild bladder wall thickening. Stomach/Bowel: Enteric contrast is present within the colon, but there is none in the stomach or small bowel. The stomach and small bowel are decompressed without apparent focal wall thickening. The appendix appears normal. Diverticular changes are present within the descending and sigmoid colon. No evidence of bowel obstruction or perforation. Vascular/Lymphatic: Again demonstrated are several mildly enlarged retroperitoneal and left pelvic lymph nodes, similar to the prior CT and presumably related to the patient's CLL.  No progressive adenopathy identified. Diffuse aortic and branch vessel atherosclerosis without acute vascular findings on noncontrast imaging. Reproductive: Central prostatic calcifications. Other: New small amount of ascites around the liver and in both pericolic gutters and the pelvis. No focal extraluminal fluid collection or extravasated enteric contrast. There is generalized edema throughout the intra-abdominal and subcutaneous fat. There is a stable small umbilical hernia containing only fat.  Musculoskeletal: No acute or significant osseous findings. IMPRESSION: 1. New small bilateral pleural effusions, ascites and generalized edema throughout the intra-abdominal and subcutaneous fat, consistent with anasarca. No focal extraluminal fluid collection or extravasated enteric contrast. 2. New symmetric perinephric soft tissue stranding bilaterally without focal fluid collection. The bladder is decompressed by a Foley catheter. Possible mild bladder wall thickening. Correlate with urinalysis. 3. Probable atelectasis at both lung bases adjacent to the pleural effusions. No evidence of pneumonia. 4. Stable mildly enlarged retroperitoneal and left pelvic lymph nodes, presumably related to the patient's chronic lymphocytic leukemia. No significant residual splenomegaly. 5. Aortic Atherosclerosis (ICD10-I70.0). Electronically Signed   By: Richardean Sale M.D.   On: 06/07/2020 14:08   CT Head Wo Contrast  Result Date: 06/05/2020 CLINICAL DATA:  Altered mental status fatigue and weakness EXAM: CT HEAD WITHOUT CONTRAST TECHNIQUE: Contiguous axial images were obtained from the base of the skull through the vertex without intravenous contrast. COMPARISON:  MRI 11/07/2014 FINDINGS: Brain: No acute territorial infarction, hemorrhage, or intracranial mass. Mild to moderate atrophy. Mild hypodensity in the white matter consistent with chronic small vessel ischemic change. Prominent ventricle size, likely due to atrophy. Vascular: No hyperdense vessels.  No unexpected calcification Skull: Motion artifact.  No gross fracture Sinuses/Orbits: No acute finding. Other: None IMPRESSION: 1. No CT evidence for acute intracranial abnormality. 2. Atrophy and mild chronic small vessel ischemic changes of the white matter. Electronically Signed   By: Donavan Foil M.D.   On: 06/05/2020 17:29   CT CHEST WO CONTRAST  Result Date: 06/07/2020 CLINICAL DATA:  Respiratory failure and abdominal pain with fever. History of colonic  polyps and diverticulosis. History of CLL. EXAM: CT CHEST, ABDOMEN AND PELVIS WITHOUT CONTRAST TECHNIQUE: Multidetector CT imaging of the chest, abdomen and pelvis was performed following the standard protocol without IV contrast. COMPARISON:  Chest CTA 05/16/2020.  Abdominopelvic CT 04/05/2020. FINDINGS: CT CHEST FINDINGS Cardiovascular: Right arm PICC extends to the superior cavoatrial junction. There is diffuse atherosclerosis of the aorta, great vessels and coronary arteries. The heart is mildly enlarged. No significant pericardial fluid. Mediastinum/Nodes: There are no enlarged mediastinal, hilar or axillary lymph nodes. The thyroid gland, trachea and esophagus demonstrate no significant findings. Lungs/Pleura: There are new small bilateral pleural effusions with dependent opacities in both lungs, probably atelectasis. These obscure the previously demonstrated nodular densities in both lower lobes. These were not present on the abdominal CT from April and were probably inflammatory. No consolidation or new abnormality identified. There is mild central airway thickening. Musculoskeletal/Chest wall: No chest wall mass or suspicious osseous findings. CT ABDOMEN AND PELVIS FINDINGS Hepatobiliary: No focal hepatic abnormalities are identified on noncontrast imaging. The gallbladder is mildly distended without evidence of gallstones, wall thickening or focal surrounding inflammation. Pancreas: No focal pancreatic abnormality or surrounding inflammation identified. Spleen: Resolution of previously demonstrated splenomegaly. No focal abnormality identified. Adrenals/Urinary Tract: Both adrenal glands appear normal. Stable dominant cyst in the lower interpolar region of the right kidney. No evidence of urinary tract calculus or hydronephrosis. New symmetric perinephric soft tissue stranding bilaterally without focal fluid collection. The bladder is  decompressed by a Foley catheter. There is a small amount of air in the  bladder lumen. Possible mild bladder wall thickening. Stomach/Bowel: Enteric contrast is present within the colon, but there is none in the stomach or small bowel. The stomach and small bowel are decompressed without apparent focal wall thickening. The appendix appears normal. Diverticular changes are present within the descending and sigmoid colon. No evidence of bowel obstruction or perforation. Vascular/Lymphatic: Again demonstrated are several mildly enlarged retroperitoneal and left pelvic lymph nodes, similar to the prior CT and presumably related to the patient's CLL. No progressive adenopathy identified. Diffuse aortic and branch vessel atherosclerosis without acute vascular findings on noncontrast imaging. Reproductive: Central prostatic calcifications. Other: New small amount of ascites around the liver and in both pericolic gutters and the pelvis. No focal extraluminal fluid collection or extravasated enteric contrast. There is generalized edema throughout the intra-abdominal and subcutaneous fat. There is a stable small umbilical hernia containing only fat. Musculoskeletal: No acute or significant osseous findings. IMPRESSION: 1. New small bilateral pleural effusions, ascites and generalized edema throughout the intra-abdominal and subcutaneous fat, consistent with anasarca. No focal extraluminal fluid collection or extravasated enteric contrast. 2. New symmetric perinephric soft tissue stranding bilaterally without focal fluid collection. The bladder is decompressed by a Foley catheter. Possible mild bladder wall thickening. Correlate with urinalysis. 3. Probable atelectasis at both lung bases adjacent to the pleural effusions. No evidence of pneumonia. 4. Stable mildly enlarged retroperitoneal and left pelvic lymph nodes, presumably related to the patient's chronic lymphocytic leukemia. No significant residual splenomegaly. 5. Aortic Atherosclerosis (ICD10-I70.0). Electronically Signed   By: Richardean Sale M.D.   On: 06/07/2020 14:08   DG CHEST PORT 1 VIEW  Result Date: 06/08/2020 CLINICAL DATA:  Sepsis. EXAM: PORTABLE CHEST 1 VIEW COMPARISON:  CT report 06/07/2020.  Chest x-ray 06/07/2020. FINDINGS: Right PICC line noted with tip at cavoatrial junction. Heart size stable. Mild bibasilar atelectasis. No pleural effusion or pneumothorax. Stable small calcific density noted the right humeral head, most likely a bone island. IMPRESSION: 1.  Right PICC line noted with tip at cavoatrial junction. 2.  Low lung volumes.  Mild bibasilar atelectasis. Electronically Signed   By: Marcello Moores  Register   On: 06/08/2020 07:25   DG Chest Port 1 View  Result Date: 06/07/2020 CLINICAL DATA:  Respiratory failure EXAM: PORTABLE CHEST 1 VIEW COMPARISON:  06/06/2020 FINDINGS: Cardiac shadow is at the upper limits of normal in size and stable. Aortic calcifications are again seen. The lungs are well aerated bilaterally. No focal infiltrate or sizable effusion is seen. No bony abnormality is noted. IMPRESSION: No acute abnormality seen. Electronically Signed   By: Inez Catalina M.D.   On: 06/07/2020 09:03   DG CHEST PORT 1 VIEW  Result Date: 06/06/2020 CLINICAL DATA:  Possible pneumonia EXAM: PORTABLE CHEST 1 VIEW COMPARISON:  06/05/2020 FINDINGS: Cardiac shadow is stable. Tortuous aorta with calcifications is again seen. Lungs are well aerated bilaterally. No focal infiltrate or sizable effusion is seen. Some accentuation of the mediastinum is noted due to patient rotation. IMPRESSION: No acute abnormality noted. Electronically Signed   By: Inez Catalina M.D.   On: 06/06/2020 13:25   DG Chest Portable 1 View  Result Date: 06/05/2020 CLINICAL DATA:  Fatigue and weakness. EXAM: PORTABLE CHEST 1 VIEW COMPARISON:  May 16, 2020 FINDINGS: The study is limited secondary to patient rotation. There is no evidence of acute infiltrate, pleural effusion or pneumothorax. The heart size and mediastinal contours are within normal  limits.  A chronic tenth left rib fracture is seen. The visualized skeletal structures are otherwise unremarkable. IMPRESSION: No active disease. Electronically Signed   By: Virgina Norfolk M.D.   On: 06/05/2020 16:07   ECHOCARDIOGRAM COMPLETE  Result Date: 06/07/2020    ECHOCARDIOGRAM REPORT   Patient Name:   LENZY KERSCHNER Hinchey Date of Exam: 06/07/2020 Medical Rec #:  [id number]         Height:       68.5 in Accession #:    [id number]        Weight:       130.1 lb Date of Birth:  1941-02-22         BSA:          65.711 m Patient Age:    47 years          BP:           93/60 mmHg Patient Gender: M                 HR:           69 bpm. Exam Location:  Inpatient Procedure: 2D Echo, Cardiac Doppler, Color Doppler and Intracardiac            Opacification Agent Indications:    Chemo V67.2 / Z09                 Fever 780.6 / R50.9  History:        Patient has no prior history of Echocardiogram examinations.                 Risk Factors:Hypertension, Diabetes, Dyslipidemia and                 Non-Smoker. GERD.  Sonographer:    Vickie Epley RDCS Referring Phys: [id number]  1. Left ventricular ejection fraction, by estimation, is 25 to 30%. The left ventricle has severely decreased function. The left ventricle has no regional wall motion abnormalities. Left ventricular diastolic parameters are consistent with Grade II diastolic dysfunction (pseudonormalization).  2. Right ventricular systolic function is normal. The right ventricular size is normal. There is normal pulmonary artery systolic pressure.  3. The mitral valve is normal in structure. No evidence of mitral valve regurgitation. No evidence of mitral stenosis.  4. The aortic valve is grossly normal. Aortic valve regurgitation is not visualized. No aortic stenosis is present.  5. The inferior vena cava is normal in size with greater than 50% respiratory variability, suggesting right atrial pressure of 3 mmHg. FINDINGS  Left Ventricle: Left ventricular  ejection fraction, by estimation, is 25 to 30%. The left ventricle has severely decreased function. The left ventricle has no regional wall motion abnormalities. Definity contrast agent was given IV to delineate the left  ventricular endocardial borders. The left ventricular internal cavity size was normal in size. There is no left ventricular hypertrophy. Left ventricular diastolic parameters are consistent with Grade II diastolic dysfunction (pseudonormalization). Right Ventricle: The right ventricular size is normal. No increase in right ventricular wall thickness. Right ventricular systolic function is normal. There is normal pulmonary artery systolic pressure. The tricuspid regurgitant velocity is 1.96 m/s, and  with an assumed right atrial pressure of 3 mmHg, the estimated right ventricular systolic pressure is 63.3 mmHg. Left Atrium: Left atrial size was normal in size. Right Atrium: Right atrial size was normal in size. Pericardium: There is no evidence of pericardial effusion. Mitral Valve: The mitral valve is normal  in structure. No evidence of mitral valve regurgitation. No evidence of mitral valve stenosis. Tricuspid Valve: The tricuspid valve is normal in structure. Tricuspid valve regurgitation is mild . No evidence of tricuspid stenosis. Aortic Valve: The aortic valve is grossly normal. Aortic valve regurgitation is not visualized. No aortic stenosis is present. Pulmonic Valve: The pulmonic valve was normal in structure. Pulmonic valve regurgitation is not visualized. No evidence of pulmonic stenosis. Aorta: The aortic root and ascending aorta are structurally normal, with no evidence of dilitation. Venous: The inferior vena cava is normal in size with greater than 50% respiratory variability, suggesting right atrial pressure of 3 mmHg. IAS/Shunts: The atrial septum is grossly normal.  LEFT VENTRICLE PLAX 2D LVIDd:         4.40 cm      Diastology LVIDs:         3.70 cm      LV e' lateral:   7.27 cm/s  LV PW:         0.70 cm      LV E/e' lateral: 10.3 LV IVS:        0.70 cm      LV e' medial:    6.57 cm/s LVOT diam:     1.80 cm      LV E/e' medial:  11.4 LV SV:         26 LV SV Index:   15 LVOT Area:     2.54 cm  LV Volumes (MOD) LV vol d, MOD A2C: 126.0 ml LV vol d, MOD A4C: 113.0 ml LV vol s, MOD A2C: 85.1 ml LV vol s, MOD A4C: 60.7 ml LV SV MOD A2C:     40.9 ml LV SV MOD A4C:     113.0 ml LV SV MOD BP:      44.6 ml RIGHT VENTRICLE RV S prime:     7.97 cm/s TAPSE (M-mode): 1.6 cm LEFT ATRIUM             Index       RIGHT ATRIUM           Index LA diam:        3.10 cm 1.81 cm/m  RA Area:     13.40 cm LA Vol (A2C):   28.0 ml 16.36 ml/m RA Volume:   31.10 ml  18.17 ml/m LA Vol (A4C):   23.0 ml 13.44 ml/m LA Biplane Vol: 27.5 ml 16.07 ml/m  AORTIC VALVE LVOT Vmax:   58.50 cm/s LVOT Vmean:  39.600 cm/s LVOT VTI:    0.102 m  AORTA Ao Root diam: 3.40 cm MITRAL VALVE               TRICUSPID VALVE MV Area (PHT): 4.06 cm    TR Peak grad:   15.4 mmHg MV Decel Time: 187 msec    TR Vmax:        196.00 cm/s MV E velocity: 75.00 cm/s MV A velocity: 72.40 cm/s  SHUNTS MV E/A ratio:  1.04        Systemic VTI:  0.10 m                            Systemic Diam: 1.80 cm Mertie Moores MD Electronically signed by Mertie Moores MD Signature Date/Time: 06/07/2020/12:52:20 PM    Final    VAS [place] LOWER EXTREMITY VENOUS (DVT)  Result Date: 06/07/2020  Lower Venous DVTStudy Indications: Pain.  Risk Factors: None identified.  Limitations: Poor ultrasound/tissue interface. Comparison Study: No prior studies. Performing Technologist: Oliver Hum RVT  Examination Guidelines: A complete evaluation includes B-mode imaging, spectral Doppler, color Doppler, and power Doppler as needed of all accessible portions of each vessel. Bilateral testing is considered an integral part of a complete examination. Limited examinations for reoccurring indications may be performed as noted. The reflux portion of the exam is performed with the patient  in reverse Trendelenburg.  +---------+---------------+---------+-----------+----------+--------------+ RIGHT    CompressibilityPhasicitySpontaneityPropertiesThrombus Aging +---------+---------------+---------+-----------+----------+--------------+ CFV      Full           Yes      Yes                                 +---------+---------------+---------+-----------+----------+--------------+ SFJ      Full                                                        +---------+---------------+---------+-----------+----------+--------------+ FV Prox  Full                                                        +---------+---------------+---------+-----------+----------+--------------+ FV Mid   Full                                                        +---------+---------------+---------+-----------+----------+--------------+ FV DistalFull                                                        +---------+---------------+---------+-----------+----------+--------------+ PFV      Full                                                        +---------+---------------+---------+-----------+----------+--------------+ POP      Full           Yes      Yes                                 +---------+---------------+---------+-----------+----------+--------------+ PTV      Full                                                        +---------+---------------+---------+-----------+----------+--------------+ PERO     Full                                                        +---------+---------------+---------+-----------+----------+--------------+   +----+---------------+---------+-----------+----------+--------------+  LEFTCompressibilityPhasicitySpontaneityPropertiesThrombus Aging +----+---------------+---------+-----------+----------+--------------+ CFV Full           Yes      Yes                                  +----+---------------+---------+-----------+----------+--------------+     Summary: RIGHT: - There is no evidence of deep vein thrombosis in the lower extremity.  - No cystic structure found in the popliteal fossa.  LEFT: - No evidence of common femoral vein obstruction.  *See table(s) above for measurements and observations. Electronically signed by Curt Jews MD on 06/07/2020 at 3:44:25 PM.    Final    [place] EKG SITE RITE  Result Date: 06/07/2020 If Site Rite image not attached, placement could not be confirmed due to current cardiac rhythm.  {  Assessment and Plan:   1. Shock - mixed hypovolemic +/- septic and cardiogenic shock accompanied by mild DKA, lactic acidosis, AKI, electrolyte disturbance, pancytopenia - sepsis physiology on admission without known organism. Had diarrhea on presentation in context of multiple laxatives in the day prior as well as chemotherapy. Wife reports general decline since December 2020. Additional component of anemia, hypovolemia, and new LV dysfunction noted.Remains on Levophed at this time with soft BP.  Mild right leg erythema noted, for management at discretion of primary team (wife reports leg got stuck in bed down in ED so unclear if contributing). RLE duplex negative for DVT. Co-ox pending.  2. New LV dysfunction - etiology unclear, differential includes stress cardiomyopathy, underlying occult coronary disease, previous regular ETOH consumption, related to suppressed TSH or chemotherapy. Admit EKG with new TWI across anterior precordial leads. He denies any CP but has had DOE for several months which he attributed to general deconditioning. BP too soft to advance any guideline directed therapies as he continues to require pressors for BP maintenance. He otherwise appears euvolemic on exam without volume overload.  CT does show underlying coronary atherosclerosis. Will need to ensure stability of blood count before considering ischemic assessment/aspirin. Also hold  off statin given abnormal LFTs. Will discuss further eval with MD.  3. Metabolic encephalopathy - appears to be improving. Upon awakening, patient is alert and oriented x3 and recognizes wife, able to contribute to history.  4. Prolonged QTc on admission - likely in context of metabolic derangement, improved to 432ms on telemetry today.  5. Suppressed TSH <0.200 - will add free T4 and total T3 - further w/u per primary team.  For questions or updates, please contact [facility] Please consult [email or web address] for contact info under    Signed, Charlie Pitter, PA-C  06/08/2020 10:00 AM

## 2020-06-09 DIAGNOSIS — E43 Unspecified severe protein-calorie malnutrition: Secondary | ICD-10-CM | POA: Insufficient documentation

## 2020-06-09 LAB — GLUCOSE, CAPILLARY
Glucose-Capillary: 105 mg/dL — ABNORMAL HIGH (ref 70–99)
Glucose-Capillary: 96 mg/dL (ref 70–99)
Glucose-Capillary: 131 mg/dL — ABNORMAL HIGH (ref 70–99)
Glucose-Capillary: 184 mg/dL — ABNORMAL HIGH (ref 70–99)
Glucose-Capillary: 163 mg/dL — ABNORMAL HIGH (ref 70–99)
Glucose-Capillary: 153 mg/dL — ABNORMAL HIGH (ref 70–99)

## 2020-06-09 LAB — CBC WITH DIFFERENTIAL/PLATELET
Abs Immature Granulocytes: 0.09 10*3/uL — ABNORMAL HIGH (ref 0.00–0.07)
Basophils Absolute: 0 10*3/uL (ref 0.0–0.1)
Basophils Relative: 1 %
Eosinophils Absolute: 0 10*3/uL (ref 0.0–0.5)
Eosinophils Relative: 1 %
HCT: 31.5 % — ABNORMAL LOW (ref 39.0–52.0)
Hemoglobin: 10.5 g/dL — ABNORMAL LOW (ref 13.0–17.0)
Immature Granulocytes: 5 %
Lymphocytes Relative: 43 %
Lymphs Abs: 0.8 10*3/uL (ref 0.7–4.0)
MCH: 30.7 pg (ref 26.0–34.0)
MCHC: 33.3 g/dL (ref 30.0–36.0)
MCV: 92.1 fL (ref 80.0–100.0)
Monocytes Absolute: 0.2 10*3/uL (ref 0.1–1.0)
Monocytes Relative: 8 %
Neutro Abs: 0.8 10*3/uL — ABNORMAL LOW (ref 1.7–7.7)
Neutrophils Relative %: 42 %
Platelets: 37 10*3/uL — ABNORMAL LOW (ref 150–400)
RBC: 3.42 MIL/uL — ABNORMAL LOW (ref 4.22–5.81)
RDW: 19.2 % — ABNORMAL HIGH (ref 11.5–15.5)
WBC: 1.9 10*3/uL — ABNORMAL LOW (ref 4.0–10.5)
nRBC: 0 % (ref 0.0–0.2)

## 2020-06-09 LAB — BASIC METABOLIC PANEL
Anion gap: 6 (ref 5–15)
BUN: 40 mg/dL — ABNORMAL HIGH (ref 8–23)
CO2: 20 mmol/L — ABNORMAL LOW (ref 22–32)
Calcium: 7 mg/dL — ABNORMAL LOW (ref 8.9–10.3)
Chloride: 109 mmol/L (ref 98–111)
Creatinine, Ser: 1.01 mg/dL (ref 0.61–1.24)
GFR calc Af Amer: >60 mL/min (ref >60)
GFR calc non Af Amer: >60 mL/min (ref >60)
Glucose, Bld: 78 mg/dL (ref 70–99)
Potassium: 3.3 mmol/L — ABNORMAL LOW (ref 3.5–5.1)
Sodium: 135 mmol/L (ref 135–145)

## 2020-06-09 LAB — T3: T3, Total: 45 ng/dL — ABNORMAL LOW (ref 71–180)

## 2020-06-09 LAB — HAPTOGLOBIN: Haptoglobin: 172 mg/dL (ref 34–355)

## 2020-06-09 MED ORDER — POTASSIUM CHLORIDE CRYS ER 20 MEQ PO TBCR
20.0000 meq | EXTENDED_RELEASE_TABLET | ORAL | Status: AC
  Administered 2020-06-09 (×2): 20 meq via ORAL
  Filled 2020-06-09 (×2): qty 1

## 2020-06-09 MED ORDER — POTASSIUM CHLORIDE 10 MEQ/50ML IV SOLN
10.0000 meq | INTRAVENOUS | Status: AC
  Administered 2020-06-09 (×4): 10 meq via INTRAVENOUS
  Filled 2020-06-09 (×4): qty 50

## 2020-06-09 NOTE — Consult Note (Signed)
WOC consulted for MASD, discussed with bedside nurse. Using moisture barrier cream and foam dressing per skin care protocol. No other skin care recommended at this time.  Bedside nursing staff to contact me if any acute changes noted.   Re consult if needed, will not follow at this time. Thanks  Dylann Gallier [employer], RN,CWOCN, CNS, [place] [phone])

## 2020-06-09 NOTE — Progress Notes (Signed)
Pharmacy Antibiotic Note  Tony Marshall is a 79 y.o. male admitted on 06/05/2020 with history of DM2, diverticulosis, GERD, HLD, HTN, and CLL undergoing active treatment with bendamustine and Rituxan complicated by recurrent episodes of fatigue, generalized weakness, weight loss, lightheadedness, and poor appetite  Pharmacy has been consulted for cefepime and vancomycin dosing.  Today, 06/09/2020 Day 4 abx HYPOthermic. Granix 6/22-6/24, ANC incr 0.8  Plan: Vancomycin 1gm IV x 1 then 500mg  q12h, trough goal 15-20 Cefepime 2gm IV q12h Follow renal function, cultures and clinical course  Height: 5' 8.5" (174 cm) Weight: 65.9 kg (145 lb 4.5 oz) IBW/kg (Calculated) : 69.55  Temp (24hrs), Avg:97.5 F (36.4 C), Min:97.4 F (36.3 C), Max:97.8 F (36.6 C)  Recent Labs  Lab 06/06/20 0947 06/06/20 1241 06/06/20 1444 06/06/20 1708 06/07/20 0614 06/07/20 1300 06/07/20 2000 06/08/20 0434 06/09/20 0400  WBC   < >  --   --  1.3* 1.9*  --  2.9* 2.2* 1.9*  CREATININE   < > 0.98  --  1.09 1.20  --  1.14 1.12 1.01  LATICACIDVEN  --  3.5* 3.3*  --   --  1.9  --   --   --    < > = values in this interval not displayed.    Estimated Creatinine Clearance: 58 mL/min (by C-G formula based on SCr of 1.01 mg/dL).    Allergies  Allergen Reactions  . Glycopyrrolate Rash   Antimicrobials this admission: 6/22 vanc >> 6/22 cefepime >> Dose adjustments this admission:  Microbiology results: 6/22 BCx: ngtd 6/22 UCx: NGF 6/22 MRSA PCR: neg 6/22 RPR non reactive 6/23 C.diff neg  Thank you for allowing pharmacy to be a part of this patient's care.  Minda Ditto PharmD 06/09/2020, 12:29 PM

## 2020-06-09 NOTE — Progress Notes (Signed)
Contacted Felicia RN at [facility] about patient not making his due to void at 2200. Patient was bladder scanned and found to have less than 249ml currently in his bladder. PT has not been taking adequate fluid, and is not currently receiving continuous IV fluids. Awaiting new orders at this time, if any. Will continue to assess and push fluids.

## 2020-06-09 NOTE — Progress Notes (Signed)
[facility] Progress Note Patient Name: Tony Marshall DOB: June 18, 1941 MRN: [id number]   Date of Service  06/09/2020  HPI/Events of Note  K+ 3.3  eICU Interventions  K+ replaced per [facility] electrolyte replacement protocol.        Kerry Kass Setareh Rom 06/09/2020, 6:08 AM

## 2020-06-09 NOTE — Progress Notes (Signed)
Progress Note  Patient Name: Tony Marshall Date of Encounter: 06/09/2020  [facility] Cardiologist: Skeet Latch, MD   Subjective   Feeling OK.  Breathing stable and no chest pain.   Inpatient Medications    Scheduled Meds: . acetaminophen  650 mg Oral Once  . Chlorhexidine Gluconate Cloth  6 each Topical Daily  . enoxaparin (LOVENOX) injection  40 mg Subcutaneous Q24H  . feeding supplement (KATE FARMS STANDARD 1.4)  325 mL Oral BID BM  . folic acid  1 mg Intravenous Daily  . insulin aspart  0-9 Units Subcutaneous TID WC  . insulin glargine  5 Units Subcutaneous Daily  . multivitamin with minerals  1 tablet Oral Daily  . pantoprazole (PROTONIX) IV  40 mg Intravenous Q24H  . potassium chloride  20 mEq Oral Q4H  . protein supplement  12 g Oral TID WC  . sodium chloride flush  10-40 mL Intracatheter Q12H  . thiamine injection  100 mg Intravenous Daily   Continuous Infusions: . sodium chloride    . ceFEPime (MAXIPIME) IV Stopped (06/09/20 0427)  . norepinephrine (LEVOPHED) Adult infusion Stopped (06/08/20 2325)  . potassium chloride 10 mEq (06/09/20 0749)  . vancomycin Stopped (06/09/20 0225)   PRN Meds: acetaminophen **OR** [DISCONTINUED] acetaminophen, dextrose, lip balm, LORazepam, naLOXone (NARCAN)  injection, ondansetron **OR** ondansetron (ZOFRAN) IV, oxymetazoline, promethazine, sodium chloride flush   Vital Signs    Vitals:   06/09/20 0400 06/09/20 0500 06/09/20 0600 06/09/20 0700  BP: (!) 85/64 118/64  134/73  Pulse: 77     Resp: 16 19 (!) 23   Temp: (!) 97.4 F (36.3 C)     TempSrc: Oral     SpO2: 97%     Weight:  65.9 kg    Height:        Intake/Output Summary (Last 24 hours) at 06/09/2020 0831 Last data filed at 06/09/2020 0630 Gross per 24 hour  Intake 715.68 ml  Output 925 ml  Net -209.32 ml   Last 3 Weights 06/09/2020 06/08/2020 06/06/2020  Weight (lbs) 145 lb 4.5 oz 149 lb 7.6 oz 130 lb 1.1 oz  Weight (kg) 65.9 kg 67.8 kg 59 kg       Telemetry    Sinus bradycardia, sinus rhythm - Personally Reviewed  ECG    n/a - Personally Reviewed  Physical Exam   VS:  BP 134/73   Pulse 77   Temp (!) 97.4 F (36.3 C) (Oral)   Resp (!) 23   Ht 5' 8.5" (1.74 m)   Wt 65.9 kg   SpO2 97%   BMI 21.77 kg/m  , BMI Body mass index is 21.77 kg/m. GENERAL:  Well appearing HEENT: Pupils equal round and reactive, fundi not visualized, oral mucosa unremarkable NECK:  No jugular venous distention, waveform within normal limits, carotid upstroke brisk and symmetric, no bruits LUNGS:  Clear to auscultation bilaterally HEART:  RRR.  PMI not displaced or sustained,S1 and S2 within normal limits, no S3, no S4, no clicks, no rubs, no murmurs ABD:  Flat, positive bowel sounds normal in frequency in pitch, no bruits, no rebound, no guarding, no midline pulsatile mass, no hepatomegaly, no splenomegaly EXT:  2 plus pulses throughout, no edema, no cyanosis no clubbing SKIN:  No rashes no nodules NEURO:  Cranial nerves II through XII grossly intact, motor grossly intact throughout PSYCH:  Cognitively intact, oriented to person place and time   Labs    High Sensitivity Troponin:   Recent Labs  Lab 05/16/20 1303 05/16/20 1640 06/05/20 1710  TROPONINIHS 9 8 11       Chemistry Recent Labs  Lab 06/06/20 1708 06/06/20 1708 06/07/20 0614 06/07/20 0614 06/07/20 2000 06/08/20 0434 06/09/20 0400  NA 133*   < > 134*   < > 134* 132* 135  K 2.8*   < > 2.7*   < > 4.3 3.9 3.3*  CL 102   < > 124*   < > 105 107 109  CO2 22   < > 19*   < > 19* 17* 20*  GLUCOSE 114*   < > 81   < > 159* 137* 78  BUN 23   < > 22   < > 34* 39* 40*  CREATININE 1.09   < > 1.20   < > 1.14 1.12 1.01  CALCIUM 7.1*   < > 5.2*   < > 6.9* 6.8* 7.0*  PROT 3.9*  --  3.5*  --   --  4.1*  --   ALBUMIN 2.0*  --  1.4*  --   --  1.9*  --   AST 36  --  87*  --   --  61*  --   ALT 14  --  67*  --   --  102*  --   ALKPHOS 37*  --  35*  --   --  46  --   BILITOT 1.7*  --   1.7*  --   --  2.0*  --   GFRNONAA >60   < > 58*   < > >60 >60 >60  GFRAA >60   < > >60   < > >60 >60 >60  ANIONGAP 9   < > 0.0*   < > 10 8 6    < > = values in this interval not displayed.     Hematology Recent Labs  Lab 06/07/20 2000 06/08/20 0434 06/09/20 0400  WBC 2.9* 2.2* 1.9*  RBC 3.61* 3.52* 3.42*  HGB 11.2* 11.0* 10.5*  HCT 33.5* 32.3* 31.5*  MCV 92.8 91.8 92.1  MCH 31.0 31.3 30.7  MCHC 33.4 34.1 33.3  RDW 18.1* 18.7* 19.2*  PLT 119* 106* 37*    BNPNo results for input(s): BNP, PROBNP in the last 168 hours.   DDimer No results for input(s): DDIMER in the last 168 hours.   Radiology    CT ABDOMEN PELVIS WO CONTRAST  Result Date: 06/07/2020 CLINICAL DATA:  Respiratory failure and abdominal pain with fever. History of colonic polyps and diverticulosis. History of CLL. EXAM: CT CHEST, ABDOMEN AND PELVIS WITHOUT CONTRAST TECHNIQUE: Multidetector CT imaging of the chest, abdomen and pelvis was performed following the standard protocol without IV contrast. COMPARISON:  Chest CTA 05/16/2020.  Abdominopelvic CT 04/05/2020. FINDINGS: CT CHEST FINDINGS Cardiovascular: Right arm PICC extends to the superior cavoatrial junction. There is diffuse atherosclerosis of the aorta, great vessels and coronary arteries. The heart is mildly enlarged. No significant pericardial fluid. Mediastinum/Nodes: There are no enlarged mediastinal, hilar or axillary lymph nodes. The thyroid gland, trachea and esophagus demonstrate no significant findings. Lungs/Pleura: There are new small bilateral pleural effusions with dependent opacities in both lungs, probably atelectasis. These obscure the previously demonstrated nodular densities in both lower lobes. These were not present on the abdominal CT from April and were probably inflammatory. No consolidation or new abnormality identified. There is mild central airway thickening. Musculoskeletal/Chest wall: No chest wall mass or suspicious osseous findings. CT  ABDOMEN AND PELVIS FINDINGS Hepatobiliary: No focal  hepatic abnormalities are identified on noncontrast imaging. The gallbladder is mildly distended without evidence of gallstones, wall thickening or focal surrounding inflammation. Pancreas: No focal pancreatic abnormality or surrounding inflammation identified. Spleen: Resolution of previously demonstrated splenomegaly. No focal abnormality identified. Adrenals/Urinary Tract: Both adrenal glands appear normal. Stable dominant cyst in the lower interpolar region of the right kidney. No evidence of urinary tract calculus or hydronephrosis. New symmetric perinephric soft tissue stranding bilaterally without focal fluid collection. The bladder is decompressed by a Foley catheter. There is a small amount of air in the bladder lumen. Possible mild bladder wall thickening. Stomach/Bowel: Enteric contrast is present within the colon, but there is none in the stomach or small bowel. The stomach and small bowel are decompressed without apparent focal wall thickening. The appendix appears normal. Diverticular changes are present within the descending and sigmoid colon. No evidence of bowel obstruction or perforation. Vascular/Lymphatic: Again demonstrated are several mildly enlarged retroperitoneal and left pelvic lymph nodes, similar to the prior CT and presumably related to the patient's CLL. No progressive adenopathy identified. Diffuse aortic and branch vessel atherosclerosis without acute vascular findings on noncontrast imaging. Reproductive: Central prostatic calcifications. Other: New small amount of ascites around the liver and in both pericolic gutters and the pelvis. No focal extraluminal fluid collection or extravasated enteric contrast. There is generalized edema throughout the intra-abdominal and subcutaneous fat. There is a stable small umbilical hernia containing only fat. Musculoskeletal: No acute or significant osseous findings. IMPRESSION: 1. New small  bilateral pleural effusions, ascites and generalized edema throughout the intra-abdominal and subcutaneous fat, consistent with anasarca. No focal extraluminal fluid collection or extravasated enteric contrast. 2. New symmetric perinephric soft tissue stranding bilaterally without focal fluid collection. The bladder is decompressed by a Foley catheter. Possible mild bladder wall thickening. Correlate with urinalysis. 3. Probable atelectasis at both lung bases adjacent to the pleural effusions. No evidence of pneumonia. 4. Stable mildly enlarged retroperitoneal and left pelvic lymph nodes, presumably related to the patient's chronic lymphocytic leukemia. No significant residual splenomegaly. 5. Aortic Atherosclerosis (ICD10-I70.0). Electronically Signed   By: Richardean Sale M.D.   On: 06/07/2020 14:08   CT CHEST WO CONTRAST  Result Date: 06/07/2020 CLINICAL DATA:  Respiratory failure and abdominal pain with fever. History of colonic polyps and diverticulosis. History of CLL. EXAM: CT CHEST, ABDOMEN AND PELVIS WITHOUT CONTRAST TECHNIQUE: Multidetector CT imaging of the chest, abdomen and pelvis was performed following the standard protocol without IV contrast. COMPARISON:  Chest CTA 05/16/2020.  Abdominopelvic CT 04/05/2020. FINDINGS: CT CHEST FINDINGS Cardiovascular: Right arm PICC extends to the superior cavoatrial junction. There is diffuse atherosclerosis of the aorta, great vessels and coronary arteries. The heart is mildly enlarged. No significant pericardial fluid. Mediastinum/Nodes: There are no enlarged mediastinal, hilar or axillary lymph nodes. The thyroid gland, trachea and esophagus demonstrate no significant findings. Lungs/Pleura: There are new small bilateral pleural effusions with dependent opacities in both lungs, probably atelectasis. These obscure the previously demonstrated nodular densities in both lower lobes. These were not present on the abdominal CT from April and were probably  inflammatory. No consolidation or new abnormality identified. There is mild central airway thickening. Musculoskeletal/Chest wall: No chest wall mass or suspicious osseous findings. CT ABDOMEN AND PELVIS FINDINGS Hepatobiliary: No focal hepatic abnormalities are identified on noncontrast imaging. The gallbladder is mildly distended without evidence of gallstones, wall thickening or focal surrounding inflammation. Pancreas: No focal pancreatic abnormality or surrounding inflammation identified. Spleen: Resolution of previously demonstrated splenomegaly. No focal abnormality  identified. Adrenals/Urinary Tract: Both adrenal glands appear normal. Stable dominant cyst in the lower interpolar region of the right kidney. No evidence of urinary tract calculus or hydronephrosis. New symmetric perinephric soft tissue stranding bilaterally without focal fluid collection. The bladder is decompressed by a Foley catheter. There is a small amount of air in the bladder lumen. Possible mild bladder wall thickening. Stomach/Bowel: Enteric contrast is present within the colon, but there is none in the stomach or small bowel. The stomach and small bowel are decompressed without apparent focal wall thickening. The appendix appears normal. Diverticular changes are present within the descending and sigmoid colon. No evidence of bowel obstruction or perforation. Vascular/Lymphatic: Again demonstrated are several mildly enlarged retroperitoneal and left pelvic lymph nodes, similar to the prior CT and presumably related to the patient's CLL. No progressive adenopathy identified. Diffuse aortic and branch vessel atherosclerosis without acute vascular findings on noncontrast imaging. Reproductive: Central prostatic calcifications. Other: New small amount of ascites around the liver and in both pericolic gutters and the pelvis. No focal extraluminal fluid collection or extravasated enteric contrast. There is generalized edema throughout the  intra-abdominal and subcutaneous fat. There is a stable small umbilical hernia containing only fat. Musculoskeletal: No acute or significant osseous findings. IMPRESSION: 1. New small bilateral pleural effusions, ascites and generalized edema throughout the intra-abdominal and subcutaneous fat, consistent with anasarca. No focal extraluminal fluid collection or extravasated enteric contrast. 2. New symmetric perinephric soft tissue stranding bilaterally without focal fluid collection. The bladder is decompressed by a Foley catheter. Possible mild bladder wall thickening. Correlate with urinalysis. 3. Probable atelectasis at both lung bases adjacent to the pleural effusions. No evidence of pneumonia. 4. Stable mildly enlarged retroperitoneal and left pelvic lymph nodes, presumably related to the patient's chronic lymphocytic leukemia. No significant residual splenomegaly. 5. Aortic Atherosclerosis (ICD10-I70.0). Electronically Signed   By: Richardean Sale M.D.   On: 06/07/2020 14:08   DG CHEST PORT 1 VIEW  Result Date: 06/08/2020 CLINICAL DATA:  Sepsis. EXAM: PORTABLE CHEST 1 VIEW COMPARISON:  CT report 06/07/2020.  Chest x-ray 06/07/2020. FINDINGS: Right PICC line noted with tip at cavoatrial junction. Heart size stable. Mild bibasilar atelectasis. No pleural effusion or pneumothorax. Stable small calcific density noted the right humeral head, most likely a bone island. IMPRESSION: 1.  Right PICC line noted with tip at cavoatrial junction. 2.  Low lung volumes.  Mild bibasilar atelectasis. Electronically Signed   By: Marcello Moores  Register   On: 06/08/2020 07:25   ECHOCARDIOGRAM COMPLETE  Result Date: 06/07/2020    ECHOCARDIOGRAM REPORT   Patient Name:   Tony Marshall [place] Date of Exam: 06/07/2020 Medical Rec #:  [id number]         Height:       68.5 in Accession #:    [id number]        Weight:       130.1 lb Date of Birth:  January 02, 1941         BSA:          31.711 m Patient Age:    11 years          BP:            93/60 mmHg Patient Gender: M                 HR:           69 bpm. Exam Location:  Inpatient Procedure: 2D Echo, Cardiac Doppler, Color Doppler and Intracardiac  Opacification Agent Indications:    Chemo V67.2 / Z09                 Fever 780.6 / R50.9  History:        Patient has no prior history of Echocardiogram examinations.                 Risk Factors:Hypertension, Diabetes, Dyslipidemia and                 Non-Smoker. GERD.  Sonographer:    Vickie Epley RDCS Referring Phys: [id number] [place]  1. Left ventricular ejection fraction, by estimation, is 25 to 30%. The left ventricle has severely decreased function. The left ventricle has no regional wall motion abnormalities. Left ventricular diastolic parameters are consistent with Grade II diastolic dysfunction (pseudonormalization).  2. Right ventricular systolic function is normal. The right ventricular size is normal. There is normal pulmonary artery systolic pressure.  3. The mitral valve is normal in structure. No evidence of mitral valve regurgitation. No evidence of mitral stenosis.  4. The aortic valve is grossly normal. Aortic valve regurgitation is not visualized. No aortic stenosis is present.  5. The inferior vena cava is normal in size with greater than 50% respiratory variability, suggesting right atrial pressure of 3 mmHg. FINDINGS  Left Ventricle: Left ventricular ejection fraction, by estimation, is 25 to 30%. The left ventricle has severely decreased function. The left ventricle has no regional wall motion abnormalities. Definity contrast agent was given IV to delineate the left  ventricular endocardial borders. The left ventricular internal cavity size was normal in size. There is no left ventricular hypertrophy. Left ventricular diastolic parameters are consistent with Grade II diastolic dysfunction (pseudonormalization). Right Ventricle: The right ventricular size is normal. No increase in right ventricular wall  thickness. Right ventricular systolic function is normal. There is normal pulmonary artery systolic pressure. The tricuspid regurgitant velocity is 1.96 m/s, and  with an assumed right atrial pressure of 3 mmHg, the estimated right ventricular systolic pressure is 49.4 mmHg. Left Atrium: Left atrial size was normal in size. Right Atrium: Right atrial size was normal in size. Pericardium: There is no evidence of pericardial effusion. Mitral Valve: The mitral valve is normal in structure. No evidence of mitral valve regurgitation. No evidence of mitral valve stenosis. Tricuspid Valve: The tricuspid valve is normal in structure. Tricuspid valve regurgitation is mild . No evidence of tricuspid stenosis. Aortic Valve: The aortic valve is grossly normal. Aortic valve regurgitation is not visualized. No aortic stenosis is present. Pulmonic Valve: The pulmonic valve was normal in structure. Pulmonic valve regurgitation is not visualized. No evidence of pulmonic stenosis. Aorta: The aortic root and ascending aorta are structurally normal, with no evidence of dilitation. Venous: The inferior vena cava is normal in size with greater than 50% respiratory variability, suggesting right atrial pressure of 3 mmHg. IAS/Shunts: The atrial septum is grossly normal.  LEFT VENTRICLE PLAX 2D LVIDd:         4.40 cm      Diastology LVIDs:         3.70 cm      LV e' lateral:   7.27 cm/s LV PW:         0.70 cm      LV E/e' lateral: 10.3 LV IVS:        0.70 cm      LV e' medial:    6.57 cm/s LVOT diam:     1.80 cm  LV E/e' medial:  11.4 LV SV:         26 LV SV Index:   15 LVOT Area:     2.54 cm  LV Volumes (MOD) LV vol d, MOD A2C: 126.0 ml LV vol d, MOD A4C: 113.0 ml LV vol s, MOD A2C: 85.1 ml LV vol s, MOD A4C: 60.7 ml LV SV MOD A2C:     40.9 ml LV SV MOD A4C:     113.0 ml LV SV MOD BP:      44.6 ml RIGHT VENTRICLE RV S prime:     7.97 cm/s TAPSE (M-mode): 1.6 cm LEFT ATRIUM             Index       RIGHT ATRIUM           Index LA diam:         3.10 cm 1.81 cm/m  RA Area:     13.40 cm LA Vol (A2C):   28.0 ml 16.36 ml/m RA Volume:   31.10 ml  18.17 ml/m LA Vol (A4C):   23.0 ml 13.44 ml/m LA Biplane Vol: 27.5 ml 16.07 ml/m  AORTIC VALVE LVOT Vmax:   58.50 cm/s LVOT Vmean:  39.600 cm/s LVOT VTI:    0.102 m  AORTA Ao Root diam: 3.40 cm MITRAL VALVE               TRICUSPID VALVE MV Area (PHT): 4.06 cm    TR Peak grad:   15.4 mmHg MV Decel Time: 187 msec    TR Vmax:        196.00 cm/s MV E velocity: 75.00 cm/s MV A velocity: 72.40 cm/s  SHUNTS MV E/A ratio:  1.04        Systemic VTI:  0.10 m                            Systemic Diam: 1.80 cm Mertie Moores MD Electronically signed by Mertie Moores MD Signature Date/Time: 06/07/2020/12:52:20 PM    Final    VAS [place] LOWER EXTREMITY VENOUS (DVT)  Result Date: 06/07/2020  Lower Venous DVTStudy Indications: Pain.  Risk Factors: None identified. Limitations: Poor ultrasound/tissue interface. Comparison Study: No prior studies. Performing Technologist: Oliver Hum RVT  Examination Guidelines: A complete evaluation includes B-mode imaging, spectral Doppler, color Doppler, and power Doppler as needed of all accessible portions of each vessel. Bilateral testing is considered an integral part of a complete examination. Limited examinations for reoccurring indications may be performed as noted. The reflux portion of the exam is performed with the patient in reverse Trendelenburg.  +---------+---------------+---------+-----------+----------+--------------+ RIGHT    CompressibilityPhasicitySpontaneityPropertiesThrombus Aging +---------+---------------+---------+-----------+----------+--------------+ CFV      Full           Yes      Yes                                 +---------+---------------+---------+-----------+----------+--------------+ SFJ      Full                                                        +---------+---------------+---------+-----------+----------+--------------+ FV  Prox  Full                                                        +---------+---------------+---------+-----------+----------+--------------+  FV Mid   Full                                                        +---------+---------------+---------+-----------+----------+--------------+ FV DistalFull                                                        +---------+---------------+---------+-----------+----------+--------------+ PFV      Full                                                        +---------+---------------+---------+-----------+----------+--------------+ POP      Full           Yes      Yes                                 +---------+---------------+---------+-----------+----------+--------------+ PTV      Full                                                        +---------+---------------+---------+-----------+----------+--------------+ PERO     Full                                                        +---------+---------------+---------+-----------+----------+--------------+   +----+---------------+---------+-----------+----------+--------------+ LEFTCompressibilityPhasicitySpontaneityPropertiesThrombus Aging +----+---------------+---------+-----------+----------+--------------+ CFV Full           Yes      Yes                                 +----+---------------+---------+-----------+----------+--------------+     Summary: RIGHT: - There is no evidence of deep vein thrombosis in the lower extremity.  - No cystic structure found in the popliteal fossa.  LEFT: - No evidence of common femoral vein obstruction.  *See table(s) above for measurements and observations. Electronically signed by Curt Jews MD on 06/07/2020 at 3:44:25 PM.    Final    [place] EKG SITE RITE  Result Date: 06/07/2020 If Site Rite image not attached, placement could not be confirmed due to current cardiac rhythm.   Cardiac Studies   Echo 06/07/20: 1. Left  ventricular ejection fraction, by estimation, is 25 to 30%. The  left ventricle has severely decreased function. The left ventricle has no  regional wall motion abnormalities. Left ventricular diastolic parameters  are consistent with Grade II  diastolic dysfunction (pseudonormalization).  2. Right ventricular systolic function is normal. The right ventricular  size is normal. There is normal pulmonary artery systolic pressure.  3. The mitral valve is normal in structure. No evidence of mitral valve  regurgitation. No evidence of mitral stenosis.  4. The aortic valve is grossly normal. Aortic valve regurgitation is not  visualized. No aortic stenosis is present.  5. The inferior vena cava is normal in size with greater than 50%  respiratory variability, suggesting right atrial pressure of 3 mmHg.   Patient Profile     Tony Marshall is a 3M with hypertension, hyperlipidemia, diabetes, GERD and CLL on Rituximab and [place] admitted with DKA, dehydrateion and severe constipation.  Cardiology consulted for acute systolic and diastolic heart failure.  Assessment & Plan    # Acute systolic and diastolic heart failure:  LVEF is reduced to 25 to 30%.  Grade 2 diastolic dysfunction.  Last echo was several years ago and systolic function was normal.  Clinically has symptoms.  EKG did show some T wave inversions anteriorly with lateral earlier in June.  He has been completely symptomatic and has not had any chest pain.  CT of the abdomen pelvis revealed some diffuse atherosclerosis of the aorta and coronaries.  Plan to evaluate his coronaries with stress versus catheter.  He is currently euvolemic.  Blood pressure is improving but he is just overnight.  Plan to add guideline directed medical therapy as blood pressure tolerates.  Patient unsure about his future chemotherapy plans.  His cardiomyopathy will with his next chemotherapy choice.  Unclear if this may be related to Rituximab.  #  Shock: Mixed hypovolemic/septic/cardiac.  Co-ox increased from 77 to 99?  Suggests cardiac isn't contributing much. Now off Levophed.  Continue treatment per CCM.  # Coronary atherosclerosis: # Aorta atherosclerosis: LDL goal <70.  Coronary assessment once medically stable.   Hgb was 6.2 on 6/23.  No aspirin for now.  Will check fasting lipids.        For questions or updates, please contact [place] Please consult [email or web address] for contact info under        Signed, Skeet Latch, MD  06/09/2020, 8:31 AM

## 2020-06-09 NOTE — Plan of Care (Signed)
  Problem: Clinical Measurements: Goal: Diagnostic test results will improve Outcome: Progressing   

## 2020-06-09 NOTE — Progress Notes (Signed)
NAME:  Tony Marshall, MRN:  [id number], DOB:  1941-09-30, LOS: 4 ADMISSION DATE:  06/05/2020, CONSULTATION DATE:  06/06/2020 REFERRING MD: Nada Libman MD , CHIEF COMPLAINT:  Sepsis  Brief History   79 year old with CLL on Rituxan, [place], diabetes, hypertension, hyperlipidemia admitted with diarrhea, malaise, mild DKA requiring insulin drip. Labs significant for pancytopenia, mild hyperbilirubinemia, electrolyte abnormalities.  Transferred to ICU on 6/22 for hypotension, sepsis with elevated lactic acid. PCCM consulted for help with management.  Past Medical History  CLL - dx 2014, currently on Rituximab + Bend[place]a, last treatment 05/30/20 DM  GERD Hiatal Hernia  Schatzki's Ring  Colon Polyps  HTN  HLD  OSA  Significant Hospital Events   6/21 Admit 6/22 Rapid response, transfer to ICU 6/23 ECHO with new reduction of LVEF to 25-30%  Consults:  PCCM  Procedures:    Significant Diagnostic Tests:  CT Head 6/21 >> no acute abnormality  ECHO 6/23 >> LVEF 25-30%, LV with severely decreased function, Grade II diastolic dysfunction. RV systolic function is normal, normal pulmonary artery systolic pressure.   CT Chest, ABD, Pelvis 6/23 >> new small bilateral pleural effusions, ascites and generalized edema throughout the intra-abdominal and SQ fat consistent with anasarca, new symmetric perinephric soft tissue stranding bilaterally without focal fluid collection, probable atelectasis at both lung bases with adjacent pleural effusions, no evidence of PNA, stable mildly enlarged retroperitoneal & left pelvic lymph nodes, no significant residual splenomegaly  Micro Data:  COVID 6/21 >> negative  C-Diff 6/23 >> negative  UC 6/22 >> negative  BCx2 6/22 >>   Antimicrobials:  Vancomycin 6/22 >>  Cefepime 6/22 >>  Interim history/subjective:  RA sats are 97% Glucose range 93-157 Afebrile / WBC 1.9  UOP 925 ml, - 209  in last 24 hours, but + 11.2 L since admission Levophed  off Potassium being repleted Areas concerning for cellulitis RLE tender to touch Platelets have dropped to 37K from 106  Objective   Blood pressure 134/73, pulse 77, temperature (!) 97.4 F (36.3 C), temperature source Oral, resp. rate (!) 23, height 5' 8.5" (1.74 m), weight 65.9 kg, SpO2 97 %.        Intake/Output Summary (Last 24 hours) at 06/09/2020 0811 Last data filed at 06/09/2020 0630 Gross per 24 hour  Intake 715.68 ml  Output 925 ml  Net -209.32 ml   Filed Weights   06/06/20 1734 06/08/20 0500 06/09/20 0500  Weight: 59 kg 67.8 kg 65.9 kg    Examination: General: elderly male lying in bed in NAD, on RA HEENT: NCAT, MM pink/dry, anicteric  Neuro: Resting,  alert, oriented to self/place, MAE x 4, Continued to pick at clothing, ? Some delirium CV: s1s2 rrr, no m/r/g PULM: Bilateral chest excursion, non-labored on RA, without wheeze or rhonchi GI: soft,NT, bsx4 active  Extremities: warm/dry, RLE 1+ edema and areas concerning for cellulitis marked for progression Skin: RLE with slight erythema, improved from 6/24 exam, brisk capillary refill  Resolved Hospital Problem list     Assessment & Plan:   Shock - Mixed cardiogenic, +/- septic component, & hypovolemic  Unclear source, no known organism.  Sepsis physiology present on admission.  Could have an abdominal source with diarrhea on presentation which is thought to be chemo related, C-diff negative. Additional component of anemia / hypovolemia and new reduction of LVEF. CT abd without clear source infection.  Note bilateral perinephric soft tissue stranding, UA + UC negative, suspect non-specific.   -ICU monitoring  -wean levophed for  MAP >65 >> Off 6/25-appreciate Cardiology evaluation  -trend SvO2   Elevated Glucose, Mild DKA Present on admit - CBG Q 4 -SSI, sensitive scale  -lantus 5 units QD   AKI AGMA Hypokalemia Hypomagnesemia  Hypophosphatemia  Hypocalcemia (corrected for albumin 7.3) -Trend BMP /  urinary output -Replace electrolytes as indicated -Avoid nephrotoxic agents, ensure adequate renal perfusion  Pancytopenia Thrombocytopenia Platelet drop to 37,000 on 6/25 CLL Secondary to CLL, chemotherapy.  PRBCx1 on 6/23 -trend CBC, transfuse for Hgb <7% -granix 300 mcg QD -continue lovenox for DVT, monitor for bleeding  -continue folic acid, MVI, thiamine  -await FOBT  - Monitor for bleeding - consider platelet transfusion for platelets < 50 K  Elevated LFTs  May be secondary to sepsis -CT as above  - Trend LFT's  Severe Protein Calorie Malnutrition -nutrition consult   Acute Metabolic Encephalopathy  In setting of sepsis  -monitor, supportive care - Lights off at bedtime, and on during the day  - Frequent re-orientation   Goals of care Full Code - prior discussion 6/22, wife requests full code.   Best practice:  Diet: NPO Pain/Anxiety/Delirium protocol (if indicated): NA VAP protocol (if indicated): NA DVT prophylaxis: Lovenix GI prophylaxis: PPI Glucose control: SSI, lantus Mobility: Bed Code Status: Full Family Communication: Patient and wife updated at bedside 6/24  Disposition: ICU  Labs   CBC: Recent Labs  Lab 06/05/20 1518 06/06/20 0435 06/06/20 0947 06/06/20 0947 06/06/20 1708 06/07/20 0614 06/07/20 2000 06/08/20 0434 06/09/20 0400  WBC 2.4*   < > 2.1*   < > 1.3* 1.9* 2.9* 2.2* 1.9*  NEUTROABS 0.1*  --  0.2*  --   --  0.4*  --  0.5* 0.8*  HGB 9.4*   < > 7.7*   < > 7.3* 6.2* 11.2* 11.0* 10.5*  HCT 27.7*   < > 23.2*   < > 21.5* 18.8* 33.5* 32.3* 31.5*  MCV 97.5   < > 98.7   < > 96.8 97.9 92.8 91.8 92.1  PLT 238   < > 163   < > 162 109* 119* 106* 37*   < > = values in this interval not displayed.    Basic Metabolic Panel: Recent Labs  Lab 06/05/20 1230 06/05/20 1518 06/06/20 0947 06/06/20 1241 06/06/20 1708 06/07/20 0614 06/07/20 2000 06/08/20 0434 06/09/20 0400  NA 133*   < >  --    < > 133* 134* 134* 132* 135  K 3.4*   < >   --    < > 2.8* 2.7* 4.3 3.9 3.3*  CL 91*   < >  --    < > 102 124* 105 107 109  CO2 22   < >  --    < > 22 19* 19* 17* 20*  GLUCOSE 348*   < >  --    < > 114* 81 159* 137* 78  BUN 19   < >  --    < > 23 22 34* 39* 40*  CREATININE 1.14   < >  --    < > 1.09 1.20 1.14 1.12 1.01  CALCIUM 9.0   < >  --    < > 7.1* 5.2* 6.9* 6.8* 7.0*  MG 1.5*  --  1.5*  --  2.1 1.6* 2.5*  --   --   PHOS  --   --   --   --   --  1.5* 3.5  --   --    < > =  values in this interval not displayed.   GFR: Estimated Creatinine Clearance: 58 mL/min (by C-G formula based on SCr of 1.01 mg/dL). Recent Labs  Lab 06/06/20 1241 06/06/20 1444 06/06/20 1708 06/07/20 0614 06/07/20 1300 06/07/20 2000 06/08/20 0434 06/09/20 0400  WBC  --   --    < > 1.9*  --  2.9* 2.2* 1.9*  LATICACIDVEN 3.5* 3.3*  --   --  1.9  --   --   --    < > = values in this interval not displayed.    Liver Function Tests: Recent Labs  Lab 06/06/20 0435 06/06/20 1241 06/06/20 1708 06/07/20 0614 06/08/20 0434  AST 26 39 36 87* 61*  ALT 12 14 14  67* 102*  ALKPHOS 39 41 37* 35* 46  BILITOT 1.7* 2.0* 1.7* 1.7* 2.0*  PROT 4.6* 4.6* 3.9* 3.5* 4.1*  ALBUMIN 2.7* 2.5* 2.0* 1.4* 1.9*   No results for input(s): LIPASE, AMYLASE in the last 168 hours. Recent Labs  Lab 06/06/20 0435  AMMONIA 20    ABG    Component Value Date/Time   PHART 7.491 (H) 06/07/2020 0355   PCO2ART 27.9 (L) 06/07/2020 0355   PO2ART 308 (H) 06/07/2020 0355   HCO3 21.2 06/07/2020 0355   TCO2 25 11/29/2007 1303   ACIDBASEDEF 1.5 06/07/2020 0355   O2SAT 99.0 06/08/2020 1217     Coagulation Profile: No results for input(s): INR, PROTIME in the last 168 hours.  Cardiac Enzymes: No results for input(s): CKTOTAL, CKMB, CKMBINDEX, TROPONINI in the last 168 hours.  HbA1C: Hgb A1c MFr Bld  Date/Time Value Ref Range Status  06/06/2020 09:47 AM 7.7 (H) 4.8 - 5.6 % Final    Comment:    (NOTE)         Prediabetes: 5.7 - 6.4         Diabetes: >6.4          Glycemic control for adults with diabetes: <7.0   03/20/2020 02:03 PM 4.7 4.6 - 6.5 % Final    Comment:    Glycemic Control Guidelines for People with Diabetes:Non Diabetic:  <6%Goal of Therapy: <7%Additional Action Suggested:  >8%     CBG: Recent Labs  Lab 06/08/20 1237 06/08/20 1555 06/08/20 1956 06/08/20 2341 06/09/20 0338  GLUCAP 157* 142* 114* 93 105*     Critical care time: 32 minutes     Magdalen Spatz, MSN, AGACNP-BC [place] for personal pager PCCM on call pager [phone] [facility] 06/09/2020, 8:11 AM   Please see [email or web address] for pager details.

## 2020-06-09 NOTE — Progress Notes (Signed)
[facility] Progress Note Patient Name: Tony Marshall DOB: 1941-10-03 MRN: [id number]   Date of Service  06/09/2020  HPI/Events of Note  Oliguria - LVEF = 20-25%, Patient has PICC line. Bladder scan with only 160 mL residual.   eICU Interventions  Plan: 1. Monitor CVP now and Q 4 hours.     Intervention Category Major Interventions: Other:  Lysle Dingwall 06/09/2020, 11:06 PM

## 2020-06-10 ENCOUNTER — Inpatient Hospital Stay (HOSPITAL_COMMUNITY): Payer: Medicare HMO

## 2020-06-10 LAB — COMPREHENSIVE METABOLIC PANEL
ALT: 64 U/L — ABNORMAL HIGH (ref 0–44)
AST: 25 U/L (ref 15–41)
Albumin: 1.8 g/dL — ABNORMAL LOW (ref 3.5–5.0)
Alkaline Phosphatase: 65 U/L (ref 38–126)
Anion gap: 8 (ref 5–15)
BUN: 34 mg/dL — ABNORMAL HIGH (ref 8–23)
CO2: 18 mmol/L — ABNORMAL LOW (ref 22–32)
Calcium: 7.2 mg/dL — ABNORMAL LOW (ref 8.9–10.3)
Chloride: 108 mmol/L (ref 98–111)
Creatinine, Ser: 0.84 mg/dL (ref 0.61–1.24)
GFR calc Af Amer: >60 mL/min (ref >60)
GFR calc non Af Amer: >60 mL/min (ref >60)
Glucose, Bld: 128 mg/dL — ABNORMAL HIGH (ref 70–99)
Potassium: 3.9 mmol/L (ref 3.5–5.1)
Sodium: 134 mmol/L — ABNORMAL LOW (ref 135–145)
Total Bilirubin: 1.4 mg/dL — ABNORMAL HIGH (ref 0.3–1.2)
Total Protein: 4.2 g/dL — ABNORMAL LOW (ref 6.5–8.1)

## 2020-06-10 LAB — CBC WITH DIFFERENTIAL/PLATELET
Abs Immature Granulocytes: 0.18 10*3/uL — ABNORMAL HIGH (ref 0.00–0.07)
Basophils Absolute: 0 10*3/uL (ref 0.0–0.1)
Basophils Relative: 1 %
Eosinophils Absolute: 0 10*3/uL (ref 0.0–0.5)
Eosinophils Relative: 0 %
HCT: 30.9 % — ABNORMAL LOW (ref 39.0–52.0)
Hemoglobin: 9.9 g/dL — ABNORMAL LOW (ref 13.0–17.0)
Immature Granulocytes: 3 %
Lymphocytes Relative: 17 %
Lymphs Abs: 1.1 10*3/uL (ref 0.7–4.0)
MCH: 29.9 pg (ref 26.0–34.0)
MCHC: 32 g/dL (ref 30.0–36.0)
MCV: 93.4 fL (ref 80.0–100.0)
Monocytes Absolute: 0.4 10*3/uL (ref 0.1–1.0)
Monocytes Relative: 6 %
Neutro Abs: 4.7 10*3/uL (ref 1.7–7.7)
Neutrophils Relative %: 73 %
Platelets: 37 10*3/uL — ABNORMAL LOW (ref 150–400)
RBC: 3.31 MIL/uL — ABNORMAL LOW (ref 4.22–5.81)
RDW: 19.6 % — ABNORMAL HIGH (ref 11.5–15.5)
WBC: 6.3 10*3/uL (ref 4.0–10.5)
nRBC: 0 % (ref 0.0–0.2)

## 2020-06-10 LAB — GLUCOSE, CAPILLARY
Glucose-Capillary: 106 mg/dL — ABNORMAL HIGH (ref 70–99)
Glucose-Capillary: 128 mg/dL — ABNORMAL HIGH (ref 70–99)
Glucose-Capillary: 139 mg/dL — ABNORMAL HIGH (ref 70–99)
Glucose-Capillary: 191 mg/dL — ABNORMAL HIGH (ref 70–99)
Glucose-Capillary: 142 mg/dL — ABNORMAL HIGH (ref 70–99)
Glucose-Capillary: 132 mg/dL — ABNORMAL HIGH (ref 70–99)

## 2020-06-10 LAB — LACTIC ACID, PLASMA: Lactic Acid, Venous: 1.2 mmol/L (ref 0.5–1.9)

## 2020-06-10 LAB — LIPID PANEL
Cholesterol: 98 mg/dL (ref 0–200)
HDL: 5 mg/dL — ABNORMAL LOW (ref >40)
LDL Cholesterol: 61 mg/dL (ref 0–99)
Total CHOL/HDL Ratio: 19.6 RATIO
Triglycerides: 160 mg/dL — ABNORMAL HIGH (ref <150)
VLDL: 32 mg/dL (ref 0–40)

## 2020-06-10 LAB — MAGNESIUM: Magnesium: 1.9 mg/dL (ref 1.7–2.4)

## 2020-06-10 LAB — CK TOTAL AND CKMB (NOT AT ARMC)
CK, MB: 22.2 ng/mL — ABNORMAL HIGH (ref 0.5–5.0)
Relative Index: INVALID (ref 0.0–2.5)
Total CK: 87 U/L (ref 49–397)

## 2020-06-10 MED ORDER — CEFAZOLIN SODIUM-DEXTROSE 2-4 GM/100ML-% IV SOLN
2.0000 g | Freq: Three times a day (TID) | INTRAVENOUS | Status: DC
  Administered 2020-06-10 – 2020-06-20 (×31): 2 g via INTRAVENOUS
  Filled 2020-06-10 (×35): qty 100

## 2020-06-10 MED ORDER — SODIUM CHLORIDE 0.9 % IV BOLUS
250.0000 mL | Freq: Once | INTRAVENOUS | Status: AC
  Administered 2020-06-10: 250 mL via INTRAVENOUS

## 2020-06-10 MED ORDER — SODIUM CHLORIDE 0.9 % IV BOLUS
500.0000 mL | Freq: Once | INTRAVENOUS | Status: AC
  Administered 2020-06-10: 500 mL via INTRAVENOUS

## 2020-06-10 MED ORDER — POTASSIUM CHLORIDE CRYS ER 20 MEQ PO TBCR
40.0000 meq | EXTENDED_RELEASE_TABLET | Freq: Once | ORAL | Status: AC
  Administered 2020-06-10: 40 meq via ORAL
  Filled 2020-06-10: qty 2

## 2020-06-10 MED ORDER — MAGNESIUM SULFATE 2 GM/50ML IV SOLN
2.0000 g | Freq: Once | INTRAVENOUS | Status: AC
  Administered 2020-06-10: 2 g via INTRAVENOUS
  Filled 2020-06-10: qty 50

## 2020-06-10 NOTE — Progress Notes (Addendum)
NAME:  Tony Marshall, MRN:  [id number], DOB:  21-Nov-1941, LOS: 5 ADMISSION DATE:  06/05/2020, CONSULTATION DATE:  06/06/2020 REFERRING MD: Nada Libman MD , CHIEF COMPLAINT:  Sepsis  Brief History   79 year old with CLL on Rituxan, [place], diabetes, hypertension, hyperlipidemia admitted with diarrhea, malaise, mild DKA requiring insulin drip. Labs significant for pancytopenia, mild hyperbilirubinemia, electrolyte abnormalities.  Transferred to ICU on 6/22 for hypotension, sepsis with elevated lactic acid. PCCM consulted for help with management.  Past Medical History  CLL - dx 2014, currently on Rituximab + [place], last treatment 05/30/20 DM  GERD Hiatal Hernia  Schatzki's Ring  Colon Polyps  HTN  HLD  OSA  Significant Hospital Events   6/21 Admit - in ER - > assess RLE.  Family reports pt "got his leg hung in the rail in the ER".   6/22 Rapid response, transfer to ICU 6/23 ECHO with new reduction of LVEF to 25-30% 6/23 - RLE cellulitis  6/25- RA sats are 97% Glucose range 93-157 Afebrile / WBC 1.9  UOP 925 ml, - 209  in last 24 hours, but + 11.2 L since admission Levophed off Potassium being repleted Areas concerning for cellulitis RLE tender to touch Platelets have dropped to 37K from 106  Consults:  PCCM  Procedures:    Significant Diagnostic Tests:  CT Head 6/21 >> no acute abnormality   ECHO 6/23 >> LVEF 25-30%, LV with severely decreased function, Grade II diastolic dysfunction. RV systolic function is normal, normal pulmonary artery systolic pressure.    Duplex 6/23 - neg dvt  CT Chest, ABD, Pelvis 6/23 >> new small bilateral pleural effusions, ascites and generalized edema throughout the intra-abdominal and SQ fat consistent with anasarca, new symmetric perinephric soft tissue stranding bilaterally without focal fluid collection, probable atelectasis at both lung bases with adjacent pleural effusions, no evidence of PNA, stable mildly enlarged retroperitoneal &  left pelvic lymph nodes, no significant residual splenomegaly  Micro Data:  COVID 6/21 >> negative  C-Diff 6/23 >> negative  UC 6/22 >> negative  BCx2 6/22 >>  mrsa 6/22 - neg  Antimicrobials:  Vancomycin 6/22 >>  Cefepime 6/22 >> [id number] Ancef 6/26 2gm q8h (verbal call with Dr Baxter Flattery) >>  Interim history/subjective:    06/10/2020 - Did get fluid bolus overnight. Currently not on o2, not on pressors. Wife and daughter at bedsie - report early morning during arousal was mildly confused but since them calm and oriented. RLE cellulitis - is worse per family and beginning to blister. They feel antibiotics are not strong enough.  [place] negative dvt 06/07/20  Afebrile since 6/22 Platelet still low at 37K x 24h Ur Op not accurate bu creat  normal  Objective   Blood pressure (!) 119/59, pulse 90, temperature 97.9 F (36.6 C), temperature source Oral, resp. rate 19, height 5' 8.5" (1.74 m), weight 65.9 kg, SpO2 100 %. CVP:  [1 mmHg-4 mmHg] 4 mmHg      Intake/Output Summary (Last 24 hours) at 06/10/2020 1048 Last data filed at 06/10/2020 0500 Gross per 24 hour  Intake 1059.67 ml  Output 700 ml  Net 359.67 ml   Filed Weights   06/06/20 1734 06/08/20 0500 06/09/20 0500  Weight: 59 kg 67.8 kg 65.9 kg    Examination:     Resolved Hospital Problem list    Shock (mixed cardiogenic s+/- epstic)- resolved 6/25 AKI   Assessment & Plan:   Immunesuppressed s/p B cell depletion RLE cellulitis and sepsis - onset 6/23  following RLE leg trauma in ER. DVT negative  06/10/2020 - afebrile since 6/23 and resolved shock but clinically unimproved  Plan  - dc vanc - dc cefepime  - start ancef 2gm Q8h (d/w Dr Baxter Flattery of ID)   - Recheck duplex LE - check Lactic and CK  - check g6pd -> if normal strt bactrim pcp proph 1DS MWF (esp due to s/p rituxan)   Pancytopenia due to CLL and immune supppression  6/26 - anemia stable, wbc better, but platelet still low 37 k sine 6/25. Not on  antiplatelet or antiicoag  Plan  = trend CBC, transfuse for Hgb <7% -granix 300 mcg QD - no aspirin, lovenox, etc., (check HITT) till platelet bettter    Acute Metabolic Encephalopathy  - In setting of sepsis   6/26 - improving  Plan -monitor, supportive care - Lights off at bedtime, and on during the day  - Frequent re-orientation  -dc ativan   Acute on chronic combined systolic and diastolic CHF   8/34- CVP low and cadiology opinion is patien dru  Plan  - hold off chf meds for the moment - fluid bolus prn  - cards following   Electrolyte imbalance   06/10/2020 - low k and low mag  Plan  - repelte   Transaminitis  6/26- improving  Plan  - monitor  Severe Protein Calorie Malnutrition -nutrition consult   Goals of care Full Code - prior discussion 6/22, wife requests full code.   Best practice:  Diet: low salt diet Pain/Anxiety/Delirium protocol (if indicated): NA VAP protocol (if indicated): NA DVT prophylaxis: Lovenix GI prophylaxis: PPI Glucose control: SSI, lantus Mobility: Bed Code Status: Full Family Communication : wife and daughter at bedisde Disposition: ICU -> SDU and ask TRiad to be primary 06/11/20 with ccm off (d/w Dr Alroy Dust)      ATTESTATION & SIGNATURE    Dr. Brand Males, M.D., F.C.C.P Pulmonary and Critical Care Medicine Staff Physician [facility] Pager: [phone], If no answer or between  15:00h - 7:00h: call [phone]  06/10/2020 10:48 AM    LABS    PULMONARY Recent Labs  Lab 06/05/20 1616 06/06/20 1710 06/07/20 0355 06/07/20 1200 06/08/20 1217  PHART  --  7.563* 7.491*  --   --   PCO2ART  --  25.2* 27.9*  --   --   PO2ART  --  278* 308*  --   --   HCO3 21.1 22.7 21.2  --   --   O2SAT 50.9 100.0 100.0 77.4 99.0    CBC Recent Labs  Lab 06/08/20 0434 06/09/20 0400 06/10/20 0432  HGB 11.0* 10.5* 9.9*  HCT 32.3* 31.5* 30.9*  WBC 2.2* 1.9*  6.3  PLT 106* 37* 37*    COAGULATION No results for input(s): INR in the last 168 hours.  CARDIAC  No results for input(s): TROPONINI in the last 168 hours. No results for input(s): PROBNP in the last 168 hours.   CHEMISTRY Recent Labs  Lab 06/06/20 0947 06/06/20 1241 06/06/20 1708 06/06/20 1708 06/07/20 9211 06/07/20 0614 06/07/20 2000 06/07/20 2000 06/08/20 0434 06/08/20 0434 06/09/20 0400 06/10/20 0432  NA  --    < > 133*   < > 134*  --  134*  --  132*  --  135 134*  K  --    < > 2.8*   < > 2.7*   < > 4.3   < >  3.9   < > 3.3* 3.9  CL  --    < > 102   < > 124*  --  105  --  107  --  109 108  CO2  --    < > 22   < > 19*  --  19*  --  17*  --  20* 18*  GLUCOSE  --    < > 114*   < > 81  --  159*  --  137*  --  78 128*  BUN  --    < > 23   < > 22  --  34*  --  39*  --  40* 34*  CREATININE  --    < > 1.09   < > 1.20  --  1.14  --  1.12  --  1.01 0.84  CALCIUM  --    < > 7.1*   < > 5.2*  --  6.9*  --  6.8*  --  7.0* 7.2*  MG 1.5*  --  2.1  --  1.6*  --  2.5*  --   --   --   --  1.9  PHOS  --   --   --   --  1.5*  --  3.5  --   --   --   --   --    < > = values in this interval not displayed.   Estimated Creatinine Clearance: 69.7 mL/min (by C-G formula based on SCr of 0.84 mg/dL).   LIVER Recent Labs  Lab 06/06/20 1241 06/06/20 1708 06/07/20 0614 06/08/20 0434 06/10/20 0432  AST 39 36 87* 61* 25  ALT 14 14 67* 102* 64*  ALKPHOS 41 37* 35* 46 65  BILITOT 2.0* 1.7* 1.7* 2.0* 1.4*  PROT 4.6* 3.9* 3.5* 4.1* 4.2*  ALBUMIN 2.5* 2.0* 1.4* 1.9* 1.8*     INFECTIOUS Recent Labs  Lab 06/06/20 1241 06/06/20 1444 06/07/20 1300  LATICACIDVEN 3.5* 3.3* 1.9     ENDOCRINE CBG (last 3)  Recent Labs    06/09/20 2328 06/10/20 0413 06/10/20 0752  GLUCAP 153* 106* 128*         IMAGING x48h  - image(s) personally visualized  -   highlighted in bold DG CHEST PORT 1 VIEW  Result Date: 06/10/2020 CLINICAL DATA:  Respiratory abnormality. EXAM: PORTABLE CHEST 1  VIEW COMPARISON:  June 08, 2020 FINDINGS: A right PICC line terminates in the central SVC. Haziness in the lung bases is identified. Mild cardiomegaly. The hila and mediastinum are unremarkable. No nodules or masses. No overt edema. IMPRESSION: 1. Increasing hazy opacities in the lung bases could represent developing infiltrate or layering effusions with underlying atelectasis. Recommend clinical correlation and attention on follow-up. 2. The right PICC line remains in good position. 3. No acute interval changes otherwise seen. Electronically Signed   By: Dorise Bullion III M.D   On: 06/10/2020 06:41

## 2020-06-10 NOTE — Progress Notes (Signed)
Notified [facility] of reassessment of patient following NS bolus. Lung sounds remain clear from apex to base, bilaterally. PT mentation unchanged, confusion has remained steady throughout the night. CVP reading between 4-5 at this time. Unable to assess urine output, small amount of urine collected in bag before patient removed his condom cath.

## 2020-06-10 NOTE — Evaluation (Signed)
Physical Therapy Evaluation Patient Details Name: Tony Marshall MRN: [id number] DOB: 1941-08-31 Today's Date: 06/10/2020   History of Present Illness  79 year old with CLL on Rituxan, [place], diabetes, hypertension, hyperlipidemia admitted with diarrhea, malaise, mild DKA requiring insulin drip. Labs significant for pancytopenia, mild hyperbilirubinemia, electrolyte abnormalities.  Transferred to ICU on 6/22 for hypotension, sepsis with elevated lactic acid.  Pt also with RLE cellulitis and sepsis - onset 6/23 following RLE leg trauma in ER. DVT negative  Clinical Impression  Pt admitted with above diagnosis.  Pt currently with functional limitations due to the deficits listed below (see PT Problem List). Pt will benefit from skilled PT to increase their independence and safety with mobility to allow discharge to the venue listed below.  Pt agreeable to mobilize however only able to tolerate OOB to recliner due to R LE pain with weight bearing.  Pt agreeable to SNF if continues to require assist (lives with spouse).     Follow Up Recommendations SNF;Supervision/Assistance - 24 hour    Equipment Recommendations  Rolling walker with 5" wheels    Recommendations for Other Services       Precautions / Restrictions Precautions Precautions: Fall Precaution Comments: multiple lines (in ICU)      Mobility  Bed Mobility Overal bed mobility: Needs Assistance Bed Mobility: Supine to Sit     Supine to sit: Mod assist;Max assist;+2 for safety/equipment     General bed mobility comments: assist for R LE and scooting to EOB, pt also required a little trunk assist; BP upon sitting 149/84 mmHg  Transfers Overall transfer level: Needs assistance Equipment used: Rolling walker (2 wheeled) Transfers: Sit to/from [facility] Sit to Stand: Mod assist;+2 physical assistance Stand pivot transfers: Mod assist;+2 physical assistance       General transfer comment: verbal cues  for safe technique, pt requiring mod assist due to R LE pain, cues for use of RW, only to recliner as pt felt unable to tolerate weight on right leg and buckling; BP after transfer 138/80 mmHg  Ambulation/Gait                Stairs            Wheelchair Mobility    Modified Rankin (Stroke Patients Only)       Balance Overall balance assessment: Needs assistance         Standing balance support: Bilateral upper extremity supported Standing balance-Leahy Scale: Poor                               Pertinent Vitals/Pain Pain Assessment: Faces Faces Pain Scale: Hurts even more Pain Location: R LE with weight bearing Pain Descriptors / Indicators: Aching Pain Intervention(s): Monitored during session;Repositioned    Home Living Family/patient expects to be discharged to:: Private residence Living Arrangements: Spouse/significant other   Type of Home: House       Home Layout: One level Home Equipment: None      Prior Function Level of Independence: Independent               Hand Dominance        Extremity/Trunk Assessment        Lower Extremity Assessment Lower Extremity Assessment: Generalized weakness;RLE deficits/detail RLE Deficits / Details: redness in lower leg (cellulitis); pt allows to rests in flexion and external rotation for comfort       Communication   Communication: HOH  Cognition Arousal/Alertness:  Awake/alert Behavior During Therapy: WFL for tasks assessed/performed                                   General Comments: following simple commands and answering questions appropriately      General Comments      Exercises     Assessment/Plan    PT Assessment Patient needs continued PT services  PT Problem List Decreased range of motion;Decreased strength;Decreased mobility;Decreased activity tolerance;Decreased balance;Decreased knowledge of use of DME;Pain       PT Treatment  Interventions DME instruction;Therapeutic exercise;Gait training;Balance training;Functional mobility training;Therapeutic activities;Patient/family education    PT Goals (Current goals can be found in the Care Plan section)  Acute Rehab PT Goals PT Goal Formulation: With patient Time For Goal Achievement: 06/23/20 Potential to Achieve Goals: Good    Frequency Min 2X/week   Barriers to discharge        Co-evaluation               AM-PAC PT "6 Clicks" Mobility  Outcome Measure Help needed turning from your back to your side while in a flat bed without using bedrails?: A Lot Help needed moving from lying on your back to sitting on the side of a flat bed without using bedrails?: A Lot Help needed moving to and from a bed to a chair (including a wheelchair)?: A Lot Help needed standing up from a chair using your arms (e.g., wheelchair or bedside chair)?: A Lot Help needed to walk in hospital room?: Total Help needed climbing 3-5 steps with a railing? : Total 6 Click Score: 10    End of Session Equipment Utilized During Treatment: Gait belt Activity Tolerance: Patient tolerated treatment well Patient left: in chair;with call bell/phone within reach;with chair alarm set   PT Visit Diagnosis: Other abnormalities of gait and mobility (R26.89);Pain Pain - Right/Left: Right Pain - part of body: Leg    Time: 1208-1224 PT Time Calculation (min) (ACUTE ONLY): 16 min   Charges:   PT Evaluation $PT Eval Low Complexity: 1 Low         Kati PT, DPT Acute Rehabilitation Services Pager: [phone] Office: [phone]   York Ram E 06/10/2020, 1:27 PM

## 2020-06-10 NOTE — Progress Notes (Signed)
[facility] Progress Note Patient Name: Tony Marshall DOB: May 01, 1941 MRN: [id number]   Date of Service  06/10/2020  HPI/Events of Note  Oliguria - CVP = 1. LVEF = 20-25%  eICU Interventions  Will bolus with 500 mL IV over 1 hour.      Intervention Category Major Interventions: Other:  Lysle Dingwall 06/10/2020, 12:18 AM

## 2020-06-10 NOTE — Progress Notes (Signed)
[name] Progress Note Patient Name: Tony Marshall DOB: 1941/01/28 MRN: [id number]   Date of Service  06/10/2020  HPI/Events of Note  Oliguria and hypovolemia - CVP = 4-5.   eICU Interventions  Will order: 1. Bolus with 250 mL of 0,9 NaCl IV over 30 minutes now.      Intervention Category Major Interventions: Hypovolemia - evaluation and treatment with fluids Intermediate Interventions: Other:  Azoria Abbett Cornelia Copa 06/10/2020, 2:11 AM

## 2020-06-10 NOTE — Progress Notes (Signed)
Notified e link of patient CVP of 4 following second NS bolus. Unable to count urine once again due to patient removing his condom cath, bedding was saturated for the second time since boluses began. Bladder scan completed and patient found to have 225ml of urine. Physician notified of findings, will not order another bolus at this time since the patient is now voiding. Will notify [facility]ink of lab results once those are complete.

## 2020-06-10 NOTE — Progress Notes (Signed)
Progress Note   Subjective   The patient remains ill.  BP has been low requiring intermittent fluid bolus overnight.  Inpatient Medications    Scheduled Meds: . acetaminophen  650 mg Oral Once  . Chlorhexidine Gluconate Cloth  6 each Topical Daily  . feeding supplement (KATE FARMS STANDARD 1.4)  325 mL Oral BID BM  . folic acid  1 mg Intravenous Daily  . insulin aspart  0-9 Units Subcutaneous TID WC  . insulin glargine  5 Units Subcutaneous Daily  . multivitamin with minerals  1 tablet Oral Daily  . pantoprazole (PROTONIX) IV  40 mg Intravenous Q24H  . protein supplement  12 g Oral TID WC  . sodium chloride flush  10-40 mL Intracatheter Q12H  . thiamine injection  100 mg Intravenous Daily   Continuous Infusions: . sodium chloride    . ceFEPime (MAXIPIME) IV Stopped (06/10/20 0115)  . norepinephrine (LEVOPHED) Adult infusion Stopped (06/08/20 2325)  . vancomycin Stopped (06/10/20 0137)   PRN Meds: acetaminophen **OR** [DISCONTINUED] acetaminophen, dextrose, lip balm, LORazepam, naLOXone (NARCAN)  injection, ondansetron **OR** ondansetron (ZOFRAN) IV, oxymetazoline, promethazine, sodium chloride flush   Vital Signs    Vitals:   06/10/20 0600 06/10/20 0700 06/10/20 0800 06/10/20 0836  BP: (!) 165/109 128/69 (!) 128/56   Pulse: 93 86 81   Resp: 15 16 16    Temp:    97.9 F (36.6 C)  TempSrc:    Oral  SpO2: 100% 98% 99%   Weight:      Height:        Intake/Output Summary (Last 24 hours) at 06/10/2020 0846 Last data filed at 06/10/2020 0500 Gross per 24 hour  Intake 1059.67 ml  Output 700 ml  Net 359.67 ml   Filed Weights   06/06/20 1734 06/08/20 0500 06/09/20 0500  Weight: 59 kg 67.8 kg 65.9 kg    Telemetry    sinus - Personally Reviewed  Physical Exam   GEN- The patient is ill appearing, alert and oriented x 3 today.   Head- normocephalic, atraumatic Eyes-  Sclera clear, conjunctiva pink Ears- hearing intact Oropharynx- clear Neck- supple, Lungs-   normal work of breathing Heart- Regular rate and rhythm  GI- soft  Extremities- no clubbing, cyanosis, or edema  MS- no significant deformity or atrophy Skin- no rash or lesion Psych- euthymic mood, full affect Neuro- strength and sensation are intact   Labs    Chemistry Recent Labs  Lab 06/07/20 0614 06/07/20 2000 06/08/20 0434 06/09/20 0400 06/10/20 0432  NA 134*   < > 132* 135 134*  K 2.7*   < > 3.9 3.3* 3.9  CL 124*   < > 107 109 108  CO2 19*   < > 17* 20* 18*  GLUCOSE 81   < > 137* 78 128*  BUN 22   < > 39* 40* 34*  CREATININE 1.20   < > 1.12 1.01 0.84  CALCIUM 5.2*   < > 6.8* 7.0* 7.2*  PROT 3.5*  --  4.1*  --  4.2*  ALBUMIN 1.4*  --  1.9*  --  1.8*  AST 87*  --  61*  --  25  ALT 67*  --  102*  --  64*  ALKPHOS 35*  --  46  --  65  BILITOT 1.7*  --  2.0*  --  1.4*  GFRNONAA 58*   < > >60 >60 >60  GFRAA >60   < > >60 >60 >60  ANIONGAP  0.0*   < > 8 6 8    < > = values in this interval not displayed.     Hematology Recent Labs  Lab 06/08/20 0434 06/09/20 0400 06/10/20 0432  WBC 2.2* 1.9* 6.3  RBC 3.52* 3.42* 3.31*  HGB 11.0* 10.5* 9.9*  HCT 32.3* 31.5* 30.9*  MCV 91.8 92.1 93.4  MCH 31.3 30.7 29.9  MCHC 34.1 33.3 32.0  RDW 18.7* 19.2* 19.6*  PLT 106* 37* 37*     Patient ID  Tony Marshall is a 40M with hypertension, hyperlipidemia, diabetes, GERD and CLL on Rituximab and Bendeka admitted with DKA, dehydrateion and severe constipation. Cardiology consulted for acute systolic and diastolic heart failure.  Assessment & Plan    1.  Acute on chronic combined systolic and diastolic CHF Currently dry with low CVP noted.  Would continue to gently hydrate with IV NS bolus as you are, guided by CVP. Currently, we are limited in our ability to start CHF medicines given hypotension/ shock.  We can start these once he is clinically improved.  2. Atherosclerosis Will need cardiac risk stratification once clinically improved (electively) Goal LDL <70 Given  anemia and thrombocytopenia, would avoid ASA for now  Prognosis is guarded   Thompson Grayer MD, [facility] 06/10/2020 8:46 AM

## 2020-06-10 NOTE — Progress Notes (Signed)
Tony Marshall was bedside when I arrived; Tony Marshall was awake and alert during our visit. He complimented my smile (his wife said through my eyes because I had on a mask). He and his wife wanted prayer for which they were appreciative.  Please page if additional support is needed. [place], MDiv   06/10/20 1600  Clinical Encounter Type  Visited With Patient and family together

## 2020-06-11 ENCOUNTER — Inpatient Hospital Stay (HOSPITAL_COMMUNITY): Payer: Medicare HMO

## 2020-06-11 DIAGNOSIS — E440 Moderate protein-calorie malnutrition: Secondary | ICD-10-CM

## 2020-06-11 DIAGNOSIS — L03115 Cellulitis of right lower limb: Secondary | ICD-10-CM

## 2020-06-11 DIAGNOSIS — R6520 Severe sepsis without septic shock: Secondary | ICD-10-CM

## 2020-06-11 DIAGNOSIS — A419 Sepsis, unspecified organism: Principal | ICD-10-CM

## 2020-06-11 DIAGNOSIS — G9340 Encephalopathy, unspecified: Secondary | ICD-10-CM

## 2020-06-11 DIAGNOSIS — M7989 Other specified soft tissue disorders: Secondary | ICD-10-CM

## 2020-06-11 LAB — GLUCOSE, CAPILLARY
Glucose-Capillary: 125 mg/dL — ABNORMAL HIGH (ref 70–99)
Glucose-Capillary: 177 mg/dL — ABNORMAL HIGH (ref 70–99)
Glucose-Capillary: 169 mg/dL — ABNORMAL HIGH (ref 70–99)
Glucose-Capillary: 100 mg/dL — ABNORMAL HIGH (ref 70–99)
Glucose-Capillary: 181 mg/dL — ABNORMAL HIGH (ref 70–99)

## 2020-06-11 LAB — BASIC METABOLIC PANEL
Anion gap: 6 (ref 5–15)
BUN: 26 mg/dL — ABNORMAL HIGH (ref 8–23)
CO2: 20 mmol/L — ABNORMAL LOW (ref 22–32)
Calcium: 7.4 mg/dL — ABNORMAL LOW (ref 8.9–10.3)
Chloride: 110 mmol/L (ref 98–111)
Creatinine, Ser: 0.82 mg/dL (ref 0.61–1.24)
GFR calc Af Amer: >60 mL/min (ref >60)
GFR calc non Af Amer: >60 mL/min (ref >60)
Glucose, Bld: 142 mg/dL — ABNORMAL HIGH (ref 70–99)
Potassium: 3.7 mmol/L (ref 3.5–5.1)
Sodium: 136 mmol/L (ref 135–145)

## 2020-06-11 LAB — LACTIC ACID, PLASMA: Lactic Acid, Venous: 1.1 mmol/L (ref 0.5–1.9)

## 2020-06-11 LAB — CBC WITH DIFFERENTIAL/PLATELET
Abs Immature Granulocytes: 0.55 10*3/uL — ABNORMAL HIGH (ref 0.00–0.07)
Basophils Absolute: 0.1 10*3/uL (ref 0.0–0.1)
Basophils Relative: 0 %
Eosinophils Absolute: 0 10*3/uL (ref 0.0–0.5)
Eosinophils Relative: 0 %
HCT: 28.9 % — ABNORMAL LOW (ref 39.0–52.0)
Hemoglobin: 9.3 g/dL — ABNORMAL LOW (ref 13.0–17.0)
Immature Granulocytes: 4 %
Lymphocytes Relative: 9 %
Lymphs Abs: 1.3 10*3/uL (ref 0.7–4.0)
MCH: 30.4 pg (ref 26.0–34.0)
MCHC: 32.2 g/dL (ref 30.0–36.0)
MCV: 94.4 fL (ref 80.0–100.0)
Monocytes Absolute: 0.7 10*3/uL (ref 0.1–1.0)
Monocytes Relative: 5 %
Neutro Abs: 12.2 10*3/uL — ABNORMAL HIGH (ref 1.7–7.7)
Neutrophils Relative %: 82 %
Platelets: 49 10*3/uL — ABNORMAL LOW (ref 150–400)
RBC: 3.06 MIL/uL — ABNORMAL LOW (ref 4.22–5.81)
RDW: 19.5 % — ABNORMAL HIGH (ref 11.5–15.5)
WBC: 14.9 10*3/uL — ABNORMAL HIGH (ref 4.0–10.5)
nRBC: 0 % (ref 0.0–0.2)

## 2020-06-11 LAB — PHOSPHORUS
Phosphorus: <1 mg/dL — CL (ref 2.5–4.6)
Phosphorus: 1.9 mg/dL — ABNORMAL LOW (ref 2.5–4.6)

## 2020-06-11 LAB — HEPATIC FUNCTION PANEL
ALT: 47 U/L — ABNORMAL HIGH (ref 0–44)
AST: 20 U/L (ref 15–41)
Albumin: 1.8 g/dL — ABNORMAL LOW (ref 3.5–5.0)
Alkaline Phosphatase: 72 U/L (ref 38–126)
Bilirubin, Direct: 0.3 mg/dL — ABNORMAL HIGH (ref 0.0–0.2)
Indirect Bilirubin: 0.8 mg/dL (ref 0.3–0.9)
Total Bilirubin: 1.1 mg/dL (ref 0.3–1.2)
Total Protein: 4 g/dL — ABNORMAL LOW (ref 6.5–8.1)

## 2020-06-11 LAB — CULTURE, BLOOD (ROUTINE X 2)
Culture: NO GROWTH
Culture: NO GROWTH

## 2020-06-11 MED ORDER — POTASSIUM PHOSPHATES 15 MMOLE/5ML IV SOLN
30.0000 mmol | Freq: Once | INTRAVENOUS | Status: AC
  Administered 2020-06-11: 30 mmol via INTRAVENOUS
  Filled 2020-06-11: qty 10

## 2020-06-11 MED ORDER — ALPRAZOLAM 0.5 MG PO TABS
0.5000 mg | ORAL_TABLET | Freq: Every evening | ORAL | Status: DC | PRN
  Administered 2020-06-11 – 2020-06-19 (×9): 0.5 mg via ORAL
  Filled 2020-06-11 (×9): qty 1

## 2020-06-11 NOTE — Progress Notes (Addendum)
[facility] Progress Note Patient Name: Tony Marshall DOB: 12-04-1941 MRN: [id number]   Date of Service  06/11/2020  HPI/Events of Note  Hypokalemia - K+ = 3.7 and Hypophosphatemia - PO4--- < 1.0.   eICU Interventions  Will replace K+ and PO4----. Repeat phosphorus level at 2 PM.      Intervention Category Major Interventions: Electrolyte abnormality - evaluation and management  [name] 06/11/2020, 6:18 AM

## 2020-06-11 NOTE — Progress Notes (Signed)
[facility] PROGRESS NOTE   Tony Marshall IPJ:[id number] DOB: 1941/08/26 DOA: 06/05/2020  PCP: Elayne Snare, MD  Brief History/Interval Summary: 79 year old with CLL on Rituxan, Bendeka, diabetes, hypertension, hyperlipidemia admitted with diarrhea, malaise, mild DKA requiring insulin drip. Labs significant for pancytopenia, mild hyperbilirubinemia, electrolyte abnormalities.  Transferred to ICU on 6/22 for hypotension, sepsis with elevated lactic acid. PCCM consulted for help with management.  Had to be placed on pressors.  He stabilized.  He was weaned off of Levophed.  Reason for Visit: Right lower extremity cellulitis with septic shock  Consultants: Cocaine medicine.  Cardiology  Procedures:  Transthoracic echocardiogram 6/23 IMPRESSIONS  1. Left ventricular ejection fraction, by estimation, is 25 to 30%. The  left ventricle has severely decreased function. The left ventricle has no  regional wall motion abnormalities. Left ventricular diastolic parameters  are consistent with Grade II  diastolic dysfunction (pseudonormalization).  2. Right ventricular systolic function is normal. The right ventricular  size is normal. There is normal pulmonary artery systolic pressure.  3. The mitral valve is normal in structure. No evidence of mitral valve  regurgitation. No evidence of mitral stenosis.  4. The aortic valve is grossly normal. Aortic valve regurgitation is not  visualized. No aortic stenosis is present.  5. The inferior vena cava is normal in size with greater than 50%  respiratory variability, suggesting right atrial pressure of 3 mmHg.     Antibiotics: Anti-infectives (From admission, onward)   Start     Dose/Rate Route Frequency Ordered Stop   06/10/20 1230  ceFAZolin (ANCEF) IVPB 2g/100 mL premix     Discontinue     2 g 200 mL/hr over 30 Minutes Intravenous Every 8 hours 06/10/20 1205     06/07/20 0000  vancomycin (VANCOREADY) IVPB 500 mg/100 mL  Status:   Discontinued        500 mg 100 mL/hr over 60 Minutes Intravenous Every 12 hours 06/06/20 1316 06/10/20 1141   06/06/20 1300  ceFEPIme (MAXIPIME) 2 g in sodium chloride 0.9 % 100 mL IVPB  Status:  Discontinued        2 g 200 mL/hr over 30 Minutes Intravenous Every 12 hours 06/06/20 1209 06/10/20 1141   06/06/20 1300  vancomycin (VANCOCIN) IVPB 1000 mg/200 mL premix        1,000 mg 200 mL/hr over 60 Minutes Intravenous  Once 06/06/20 1209 06/06/20 1353      Subjective/Interval History: States that he is feeling well.  Occasional pain in the right leg but better than before.  His wife is at the bedside.  They feel that the leg has improved although still quite red.  Patient denies any shortness of breath or chest pain.  No nausea or vomiting  ROS: Denies any abdominal pain.  No headaches.    Assessment/Plan:  Right lower extremity cellulitis with sepsis/septic shock in the setting of immunosuppression Patient was on broad-spectrum antibiotics with vancomycin and cefepime.  He developed septic shock and had to be transferred to the ICU.  He required pressors.  Pressors have been weaned off for the last 48 hours or so. Patient states that he is feeling better.  His antibiotics were changed over to Ancef yesterday after discussions with infectious disease.  He has been afebrile.  WBC noted to be elevated today but he had been leukopenic previously. Keep the leg elevated.  Continue current antibacterials.  Lactic acid level was 1.2 yesterday.  CK was normal. Lower extremity Doppler study was ordered by critical  care medicine and is pending.  Pancytopenia due to CLL and immunosuppression WBC seems to have recovered.  Platelet count is also slightly better compared to yesterday.  Hemoglobin is stable.  Continue to monitor.  No evidence of overt bleeding.  There was also some concern for HIT and labs are pending.  Acute metabolic encephalopathy in the setting of sepsis This appears to be  improving.  No evidence for focal neurological deficits.  PT evaluation.  Out of bed to chair.  Acute on chronic combined systolic and diastolic CHF Cardiology is following.  His echocardiogram showed a EF of 25 to 30%.  At this time patient appears to be euvolemic.  His cardiac medications have been on hold due to septic shock.  LDL 61.  Continue to monitor.  Also noted to have atherosclerosis.  Will need further evaluation most likely as an outpatient.  Diabetes mellitus type 2, uncontrolled with hyperglycemia HbA1c 7.7.  Currently on Lantus and SSI.  Takes Tyler Aas at home.  Also on Metformin and Actos.  Actos will need to be stopped due to his CHF.  Electrolyte imbalance Phosphorus level noted to be less than 1.  This was repleted this morning.  We will recheck it.  Potassium 3.7.  Transaminitis Most likely due to shock and sepsis.  Improving.  Moderate protein calorie malnutrition Dietitian is following. Nutrition Problem: Moderate Malnutrition Etiology: chronic illness, cancer and cancer related treatments  Signs/Symptoms: mild fat depletion, moderate fat depletion, moderate muscle depletion  Interventions: MVI, Other (Comment) (Beneprotein and [employer])  Possible transfer to the floor later today.  DVT Prophylaxis: Lovenox on hold due to thrombocytopenia  Code Status: Full code Family Communication: Discussed with his wife Disposition Plan:  Status is: Inpatient  Remains inpatient appropriate because:Ongoing diagnostic testing needed not appropriate for outpatient work up and IV treatments appropriate due to intensity of illness or inability to take PO   Dispo: The patient is from: Home              Anticipated d/c is to: To be determined              Anticipated d/c date is: 3 days              Patient currently is not medically stable to d/c.       Medications:  Scheduled: . acetaminophen  650 mg Oral Once  . Chlorhexidine Gluconate Cloth  6 each Topical Daily    . feeding supplement (KATE FARMS STANDARD 1.4)  325 mL Oral BID BM  . folic acid  1 mg Intravenous Daily  . insulin aspart  0-9 Units Subcutaneous TID WC  . insulin glargine  5 Units Subcutaneous Daily  . multivitamin with minerals  1 tablet Oral Daily  . pantoprazole (PROTONIX) IV  40 mg Intravenous Q24H  . protein supplement  12 g Oral TID WC  . sodium chloride flush  10-40 mL Intracatheter Q12H  . thiamine injection  100 mg Intravenous Daily   Continuous: . sodium chloride    .  ceFAZolin (ANCEF) IV Stopped (06/11/20 4580)  . potassium PHOSPHATE IVPB (in mmol) 30 mmol (06/11/20 0904)   [id number] **OR** [DISCONTINUED] acetaminophen, dextrose, lip balm, naLOXone (NARCAN)  injection, oxymetazoline, sodium chloride flush   Objective:  Vital Signs  Vitals:   06/11/20 0700 06/11/20 0800 06/11/20 0900 06/11/20 0911  BP: (!) 126/56 136/69 (!) 154/65   Pulse: 90 90 79   Resp: (!) 9 16 15    Temp:  98.4 F (36.9 C)  TempSrc:    Oral  SpO2:  99% 100%   Weight:      Height:        Intake/Output Summary (Last 24 hours) at 06/11/2020 0957 Last data filed at 06/11/2020 0753 Gross per 24 hour  Intake 507.91 ml  Output 1300 ml  Net -792.09 ml   Filed Weights   06/08/20 0500 06/09/20 0500 06/11/20 0400  Weight: 67.8 kg 65.9 kg 66 kg    General appearance: Awake alert.  In no distress Resp: Clear to auscultation bilaterally.  Normal effort Cardio: S1-S2 is normal regular.  No S3-S4.  No rubs murmurs or bruit GI: Abdomen is soft.  Nontender nondistended.  Bowel sounds are present normal.  No masses organomegaly Extremities: Right leg noted to be more swollen compared to the left.  There is erythema noted in the lower leg more towards the anteromedial side.  Good peripheral pulses.  Erythema is less according to patient and wife. Neurologic:  No focal neurological deficits.    Lab Results:  Data Reviewed: I have personally reviewed following labs and imaging  studies  CBC: Recent Labs  Lab 06/07/20 0614 06/07/20 0614 06/07/20 2000 06/08/20 0434 06/09/20 0400 06/10/20 0432 06/11/20 0348  WBC 1.9*   < > 2.9* 2.2* 1.9* 6.3 14.9*  NEUTROABS 0.4*  --   --  0.5* 0.8* 4.7 12.2*  HGB 6.2*   < > 11.2* 11.0* 10.5* 9.9* 9.3*  HCT 18.8*   < > 33.5* 32.3* 31.5* 30.9* 28.9*  MCV 97.9   < > 92.8 91.8 92.1 93.4 94.4  PLT 109*   < > 119* 106* 37* 37* 49*   < > = values in this interval not displayed.    Basic Metabolic Panel: Recent Labs  Lab 06/06/20 0947 06/06/20 1241 06/06/20 1708 06/06/20 1708 06/07/20 5277 06/07/20 8242 06/07/20 2000 06/08/20 0434 06/09/20 0400 06/10/20 0432 06/11/20 0348  NA  --    < > 133*   < > 134*   < > 134* 132* 135 134* 136  K  --    < > 2.8*   < > 2.7*   < > 4.3 3.9 3.3* 3.9 3.7  CL  --    < > 102   < > 124*   < > 105 107 109 108 110  CO2  --    < > 22   < > 19*   < > 19* 17* 20* 18* 20*  GLUCOSE  --    < > 114*   < > 81   < > 159* 137* 78 128* 142*  BUN  --    < > 23   < > 22   < > 34* 39* 40* 34* 26*  CREATININE  --    < > 1.09   < > 1.20   < > 1.14 1.12 1.01 0.84 0.82  CALCIUM  --    < > 7.1*   < > 5.2*   < > 6.9* 6.8* 7.0* 7.2* 7.4*  MG 1.5*  --  2.1  --  1.6*  --  2.5*  --   --  1.9  --   PHOS  --   --   --   --  1.5*  --  3.5  --   --   --  <1.0*   < > = values in this interval not displayed.    GFR: Estimated Creatinine Clearance: 71.5 mL/min (by C-G formula based  on SCr of 0.82 mg/dL).  Liver Function Tests: Recent Labs  Lab 06/06/20 1708 06/07/20 0614 06/08/20 0434 06/10/20 0432 06/11/20 0348  AST 36 87* 61* 25 20  ALT 14 67* 102* 64* 47*  ALKPHOS 37* 35* 46 65 72  BILITOT 1.7* 1.7* 2.0* 1.4* 1.1  PROT 3.9* 3.5* 4.1* 4.2* 4.0*  ALBUMIN 2.0* 1.4* 1.9* 1.8* 1.8*     Recent Labs  Lab 06/06/20 0435  AMMONIA 20    Cardiac Enzymes: Recent Labs  Lab 06/10/20 1128  CKTOTAL 87  CKMB 22.2*    CBG: Recent Labs  Lab 06/10/20 1616 06/10/20 2013 06/10/20 2325 06/11/20 0358  06/11/20 0809  GLUCAP 191* 142* 132* 125* 177*    Lipid Profile: Recent Labs    06/10/20 0432  CHOL 98  HDL 5*  LDLCALC 61  TRIG 160*  CHOLHDL 19.6    Thyroid Function Tests: Recent Labs    06/08/20 1217  FREET4 1.09     Recent Results (from the past 240 hour(s))  SARS Coronavirus 2 by RT PCR (hospital order, performed in [facility] hospital lab) Nasopharyngeal Nasopharyngeal Swab     Status: None   Collection Time: 06/05/20  6:23 PM   Specimen: Nasopharyngeal Swab  Result Value Ref Range Status   SARS Coronavirus 2 NEGATIVE NEGATIVE Final    Comment: (NOTE) SARS-CoV-2 target nucleic acids are NOT DETECTED.  The SARS-CoV-2 RNA is generally detectable in upper and lower respiratory specimens during the acute phase of infection. The lowest concentration of SARS-CoV-2 viral copies this assay can detect is 250 copies / mL. A negative result does not preclude SARS-CoV-2 infection and should not be used as the sole basis for treatment or other patient management decisions.  A negative result may occur with improper specimen collection / handling, submission of specimen other than nasopharyngeal swab, presence of viral mutation(s) within the areas targeted by this assay, and inadequate number of viral copies (<250 copies / mL). A negative result must be combined with clinical observations, patient history, and epidemiological information.  Fact Sheet for Patients:   StrictlyIdeas.no  Fact Sheet for Healthcare Providers: BankingDealers.co.za  This test is not yet approved or  cleared by the [place] FDA and has been authorized for detection and/or diagnosis of SARS-CoV-2 by FDA under an Emergency Use Authorization (EUA).  This EUA will remain in effect (meaning this test can be used) for the duration of the COVID-19 declaration under Section 564(b)(1) of the Act, 21 U.S.C. section 360bbb-3(b)(1), unless the  authorization is terminated or revoked sooner.  Performed at [facility], [place] [address], [place], [place] [address]   Culture, blood (Routine X 2) w Reflex to ID Panel     Status: None   Collection Time: 06/06/20 12:42 PM   Specimen: BLOOD RIGHT HAND  Result Value Ref Range Status   Specimen Description   Final    BLOOD RIGHT HAND Performed at [facility] [address] [place], [place] [address]    Special Requests   Final    BOTTLES DRAWN AEROBIC ONLY Blood Culture results may not be optimal due to an inadequate volume of blood received in culture bottles Performed at [facility] [address], [place], [place] [address]    Culture   Final    NO GROWTH 5 DAYS Performed at [facility], [place] [address], [place], [place] [address]    Report Status 06/11/2020 FINAL  Final  Culture, blood (  Routine X 2) w Reflex to ID Panel     Status: None   Collection Time: 06/06/20 12:42 PM   Specimen: BLOOD LEFT HAND  Result Value Ref Range Status   Specimen Description   Final    BLOOD LEFT HAND Performed at [facility] [address] [place], [place] [address]    Special Requests   Final    BOTTLES DRAWN AEROBIC ONLY Blood Culture results may not be optimal due to an inadequate volume of blood received in culture bottles Performed at [facility] [address], [place], [place] [address]    Culture   Final    NO GROWTH 5 DAYS Performed at [facility], [place] [address], [place], [place] [address]    Report Status 06/11/2020 FINAL  Final  MRSA PCR Screening     Status: None   Collection Time: 06/06/20  5:45 PM   Specimen: Nasal Mucosa; Nasopharyngeal  Result Value Ref Range Status   MRSA by PCR NEGATIVE NEGATIVE Final    Comment:        The GeneXpert MRSA Assay (FDA approved for NASAL specimens only), is one component of a comprehensive MRSA colonization surveillance  program. It is not intended to diagnose MRSA infection nor to guide or monitor treatment for MRSA infections. Performed at [facility], [place] [address] [place], [place] [address]   Culture, Urine     Status: None   Collection Time: 06/06/20  6:38 PM   Specimen: Urine, Catheterized  Result Value Ref Range Status   Specimen Description   Final    URINE, CATHETERIZED Performed at [place] [address], [place], [place] [address]    Special Requests   Final    NONE Performed at [facility], [place] [address], [place], [place] [address]    Culture   Final    NO GROWTH Performed at [facility], [place] [address] [place], [place] [address]    Report Status 06/08/2020 FINAL  Final  C Difficile Quick Screen w PCR reflex     Status: None   Collection Time: 06/07/20  1:30 AM   Specimen: STOOL  Result Value Ref Range Status   C Diff antigen NEGATIVE NEGATIVE Final   C Diff toxin NEGATIVE NEGATIVE Final   C Diff interpretation No C. difficile detected.  Final    Comment: Performed at [facility], [place] [address], [place], [place] [address]      Radiology Studies: DG CHEST PORT 1 VIEW  Result Date: 06/10/2020 CLINICAL DATA:  Respiratory abnormality. EXAM: PORTABLE CHEST 1 VIEW COMPARISON:  June 08, 2020 FINDINGS: A right PICC line terminates in the central SVC. Haziness in the lung bases is identified. Mild cardiomegaly. The hila and mediastinum are unremarkable. No nodules or masses. No overt edema. IMPRESSION: 1. Increasing hazy opacities in the lung bases could represent developing infiltrate or layering effusions with underlying atelectasis. Recommend clinical correlation and attention on follow-up. 2. The right PICC line remains in good position. 3. No acute interval changes otherwise seen. Electronically Signed   By: Dorise Bullion III M.D   On: 06/10/2020 06:41       LOS: 6 days   [facility] Pager on [email or web address]  06/11/2020, 9:57 AM

## 2020-06-11 NOTE — Progress Notes (Signed)
Right lower extremity venous duplex has been completed. Preliminary results can be found in CV Proc through chart review.   06/11/20 9:00 AM Tony Marshall RVT

## 2020-06-11 NOTE — Progress Notes (Signed)
Progress Note   Subjective   Doing well today, the patient denies CP or SOB.  No new concerns  Inpatient Medications    Scheduled Meds: . acetaminophen  650 mg Oral Once  . Chlorhexidine Gluconate Cloth  6 each Topical Daily  . feeding supplement (KATE FARMS STANDARD 1.4)  325 mL Oral BID BM  . folic acid  1 mg Intravenous Daily  . insulin aspart  0-9 Units Subcutaneous TID WC  . insulin glargine  5 Units Subcutaneous Daily  . multivitamin with minerals  1 tablet Oral Daily  . pantoprazole (PROTONIX) IV  40 mg Intravenous Q24H  . protein supplement  12 g Oral TID WC  . sodium chloride flush  10-40 mL Intracatheter Q12H  . thiamine injection  100 mg Intravenous Daily   Continuous Infusions: . sodium chloride    .  ceFAZolin (ANCEF) IV Stopped (06/11/20 1275)  . potassium PHOSPHATE IVPB (in mmol) 30 mmol (06/11/20 0904)   PRN Meds: acetaminophen **OR** [DISCONTINUED] acetaminophen, dextrose, lip balm, naLOXone (NARCAN)  injection, oxymetazoline, sodium chloride flush   Vital Signs    Vitals:   06/11/20 0700 06/11/20 0800 06/11/20 0900 06/11/20 0911  BP: (!) 126/56 136/69 (!) 154/65   Pulse: 90 90 79   Resp: (!) 9 16 15    Temp:    98.4 F (36.9 C)  TempSrc:    Oral  SpO2:  99% 100%   Weight:      Height:        Intake/Output Summary (Last 24 hours) at 06/11/2020 1003 Last data filed at 06/11/2020 0753 Gross per 24 hour  Intake 507.91 ml  Output 1300 ml  Net -792.09 ml   Filed Weights   06/08/20 0500 06/09/20 0500 06/11/20 0400  Weight: 67.8 kg 65.9 kg 66 kg    Telemetry    sinus - Personally Reviewed  Physical Exam   GEN- The patient is ill appearing, alert and oriented x 3 today.   Head- normocephalic, atraumatic Eyes-  Sclera clear, conjunctiva pink Ears- hearing intact Oropharynx- clear Neck- supple, Lungs-  normal work of breathing Heart- Regular rate and rhythm  GI- soft  Extremities- no clubbing, cyanosis, or edema  MS- no significant  deformity or atrophy Skin- no rash or lesion Psych- euthymic mood, full affect Neuro- strength and sensation are intact   Labs    Chemistry Recent Labs  Lab 06/08/20 0434 06/08/20 0434 06/09/20 0400 06/10/20 0432 06/11/20 0348  NA 132*   < > 135 134* 136  K 3.9   < > 3.3* 3.9 3.7  CL 107   < > 109 108 110  CO2 17*   < > 20* 18* 20*  GLUCOSE 137*   < > 78 128* 142*  BUN 39*   < > 40* 34* 26*  CREATININE 1.12   < > 1.01 0.84 0.82  CALCIUM 6.8*   < > 7.0* 7.2* 7.4*  PROT 4.1*  --   --  4.2* 4.0*  ALBUMIN 1.9*  --   --  1.8* 1.8*  AST 61*  --   --  25 20  ALT 102*  --   --  64* 47*  ALKPHOS 46  --   --  65 72  BILITOT 2.0*  --   --  1.4* 1.1  GFRNONAA >60   < > >60 >60 >60  GFRAA >60   < > >60 >60 >60  ANIONGAP 8   < > 6 8 6    < > =  values in this interval not displayed.     Hematology Recent Labs  Lab 06/09/20 0400 06/10/20 0432 06/11/20 0348  WBC 1.9* 6.3 14.9*  RBC 3.42* 3.31* 3.06*  HGB 10.5* 9.9* 9.3*  HCT 31.5* 30.9* 28.9*  MCV 92.1 93.4 94.4  MCH 30.7 29.9 30.4  MCHC 33.3 32.0 32.2  RDW 19.2* 19.6* 19.5*  PLT 37* 37* 49*     Patient ID  Mr. Schuh is a 61M with hypertension, hyperlipidemia, diabetes, GERD and CLL on Rituximab and Bendeka admitted with DKA, dehydrateion and severe constipation.Cardiology consulted for acute systolic and diastolic heart failure.  Assessment & Plan    1.  Acute on chronic systolic and diastolic dysfunctino Would keep Is and Os about event today. BP has improved with NS infusion yesterday.  Hopefully we can add medicine for CHF soon  2. Atherosclerosis Goal LDL < 70 Given anemia and thrombocytopenia, would avoid ASA for now Consider further cardiac risk stratification electively once clinically improved  Thompson Grayer MD, [facility] 06/11/2020 10:03 AM

## 2020-06-11 NOTE — Progress Notes (Signed)
Occupational Therapy Evaluation Patient Details Name: Tony Marshall MRN: [id number] DOB: [birth year] Today's Date: 06/11/2020    History of Present Illness 79 year old with CLL on Rituxan, Bendeka, diabetes, hypertension, hyperlipidemia admitted with diarrhea, malaise, mild DKA requiring insulin drip. Labs significant for pancytopenia, mild hyperbilirubinemia, electrolyte abnormalities.  Transferred to ICU on 6/22 for hypotension, sepsis with elevated lactic acid.  Pt also with RLE cellulitis and sepsis - onset 6/23 following RLE leg trauma in ER. DVT negative   Clinical Impression   PTA, pt lived at home with his wife and was independent with mobility and ADL and enjoyed hunting.fishing and playing golf. Pt able to mobilize to chair with mod A and took @ 20 steps @ RW level with VSS - apparent pain with RLE however pt was able to tolerate. Pt will benefit from rehab at SNF due to deficits listed below. Will follow acutely to facilitate DC to next venue of care.  BP 152/81 sitting; 155/79 standing; HR 83; SpO2 99 RA    Follow Up Recommendations  SNF;Supervision/Assistance - 24 hour    Equipment Recommendations  3 in 1 bedside commode    Recommendations for Other Services       Precautions / Restrictions Precautions Precautions: Fall Restrictions Weight Bearing Restrictions: No      Mobility Bed Mobility Overal bed mobility: Needs Assistance Bed Mobility: Supine to Sit     Supine to sit: Mod assist     General bed mobility comments: multipmodal cues to problem solve how to get to EOB  Transfers Overall transfer level: Needs assistance Equipment used: Rolling walker (2 wheeled) Transfers: Sit to/from [facility] Sit to Stand: Mod assist;+2 safety/equipment Stand pivot transfers: Min assist;+2 safety/equipment       General transfer comment: VC for hand placement    Balance Overall balance assessment: Needs assistance   Sitting balance-Leahy  Scale: Fair       Standing balance-Leahy Scale: Poor                             ADL either performed or assessed with clinical judgement   ADL Overall ADL's : Needs assistance/impaired Eating/Feeding: Set up;Supervision/ safety;Sitting   Grooming: Wash/dry face;Oral care;Wash/dry hands;Set up;Supervision/safety;Sitting   Upper Body Bathing: Set up;Supervision/ safety;Sitting   Lower Body Bathing: Moderate assistance;Sit to/from stand   Upper Body Dressing : Minimal assistance;Sitting   Lower Body Dressing: Sit to/from stand;Maximal assistance   Toilet Transfer: Moderate assistance;Stand-pivot;Squat-pivot   Toileting- Clothing Manipulation and Hygiene: Maximal assistance;Sit to/from stand       Functional mobility during ADLs: Moderate assistance;+2 for safety/equipment       Vision         Perception     Praxis      Pertinent Vitals/Pain Pain Assessment: Faces Faces Pain Scale: Hurts even more Pain Location: R LE with weight bearing Pain Descriptors / Indicators: Aching;Discomfort;Grimacing;Guarding Pain Intervention(s): Limited activity within patient's tolerance;Repositioned     Hand Dominance Right   Extremity/Trunk Assessment Upper Extremity Assessment Upper Extremity Assessment: Generalized weakness   Lower Extremity Assessment Lower Extremity Assessment: Defer to PT evaluation;RLE deficits/detail RLE Deficits / Details: cellulitis RLE   Cervical / Trunk Assessment Cervical / Trunk Assessment: Kyphotic;Other exceptions (forward head)   Communication Communication Communication: HOH   Cognition Arousal/Alertness: Awake/alert Behavior During Therapy: Flat affect Overall Cognitive Status: Impaired/Different from baseline Area of Impairment: Orientation;Attention;Memory;Safety/judgement;Awareness;Problem solving  Orientation Level: Disoriented to;Place;Time;Situation Current Attention Level: Sustained Memory:  Decreased short-term memory   Safety/Judgement: Decreased awareness of deficits;Decreased awareness of safety Awareness: Intellectual Problem Solving: Slow processing;Decreased initiation;Difficulty sequencing;Requires verbal cues;Requires tactile cues General Comments: Thought it was February; oriented to [facility]; aware that he is having difficulty with his memory; poor sleep pattern   General Comments       Exercises Exercises: Other exercises Other Exercises Other Exercises: Standing marching 30 steps   Shoulder Instructions      Home Living Family/patient expects to be discharged to:: Private residence Living Arrangements: Spouse/significant other Available Help at Discharge: Family Type of Home: [place]: One level     Bathroom Shower/Tub: Teacher, early years/pre: Standard Bathroom Accessibility: Yes How Accessible: Accessible via walker Home Equipment: None          Prior Functioning/Environment Level of Independence: Independent        Comments: retired from working with sheet metal; enjoys fishing/hunting and playing golf        OT Problem List: Decreased strength;Decreased range of motion;Decreased activity tolerance;Impaired balance (sitting and/or standing);Decreased coordination;Decreased cognition;Decreased safety awareness;Decreased knowledge of use of DME or AE;Cardiopulmonary status limiting activity;Pain;Increased edema      OT Treatment/Interventions: Self-care/ADL training;Therapeutic exercise;Neuromuscular education;Energy conservation;DME and/or AE instruction;Therapeutic activities;Cognitive remediation/compensation;Patient/family education;Balance training    OT Goals(Current goals can be found in the care plan section) Acute Rehab OT Goals Patient Stated Goal: to get my memory back OT Goal Formulation: With patient/family Time For Goal Achievement: 06/25/20 Potential to Achieve Goals: Good  OT Frequency:  Min 2X/week   Barriers to D/C:            Co-evaluation              AM-PAC OT "6 Clicks" Daily Activity     Outcome Measure Help from another person eating meals?: A Little Help from another person taking care of personal grooming?: A Little Help from another person toileting, which includes using toliet, bedpan, or urinal?: A Lot Help from another person bathing (including washing, rinsing, drying)?: A Lot Help from another person to put on and taking off regular upper body clothing?: A Little Help from another person to put on and taking off regular lower body clothing?: A Lot 6 Click Score: 15   End of Session Equipment Utilized During Treatment: Gait belt;Rolling walker Nurse Communication: Mobility status  Activity Tolerance: Patient tolerated treatment well Patient left: in chair;with call bell/phone within reach;with chair alarm set  OT Visit Diagnosis: Unsteadiness on feet (R26.81);Other abnormalities of gait and mobility (R26.89);Muscle weakness (generalized) (M62.81);Other symptoms and signs involving cognitive function;Pain Pain - Right/Left: Right Pain - part of body: Leg                Time: 1130-1158 OT Time Calculation (min): 28 min Charges:  OT General Charges $OT Visit: 1 Visit OT Evaluation $OT Eval Moderate Complexity: 1 Mod OT Treatments $Self Care/Home Management : 8-22 mins  Maurie Boettcher, OT/L   Acute OT Clinical Specialist [place] Pager [phone] Office [phone]   [facility] 06/11/2020, 1:45 PM

## 2020-06-11 NOTE — Progress Notes (Signed)
CRITICAL VALUE ALERT  Critical Value:  Phosphorus >1.0  Date & Time Notied:  27Jun21 @ 0600  Provider Notified: X. Blount NP  Orders Received/Actions taken: AWAITING

## 2020-06-12 DIAGNOSIS — E878 Other disorders of electrolyte and fluid balance, not elsewhere classified: Secondary | ICD-10-CM

## 2020-06-12 DIAGNOSIS — D61818 Other pancytopenia: Secondary | ICD-10-CM

## 2020-06-12 DIAGNOSIS — I10 Essential (primary) hypertension: Secondary | ICD-10-CM

## 2020-06-12 DIAGNOSIS — E119 Type 2 diabetes mellitus without complications: Secondary | ICD-10-CM

## 2020-06-12 DIAGNOSIS — I429 Cardiomyopathy, unspecified: Secondary | ICD-10-CM

## 2020-06-12 LAB — COMPREHENSIVE METABOLIC PANEL
ALT: 27 U/L (ref 0–44)
AST: 16 U/L (ref 15–41)
Albumin: 2 g/dL — ABNORMAL LOW (ref 3.5–5.0)
Alkaline Phosphatase: 80 U/L (ref 38–126)
Anion gap: 8 (ref 5–15)
BUN: 23 mg/dL (ref 8–23)
CO2: 19 mmol/L — ABNORMAL LOW (ref 22–32)
Calcium: 7.4 mg/dL — ABNORMAL LOW (ref 8.9–10.3)
Chloride: 109 mmol/L (ref 98–111)
Creatinine, Ser: 0.8 mg/dL (ref 0.61–1.24)
GFR calc Af Amer: >60 mL/min (ref >60)
GFR calc non Af Amer: >60 mL/min (ref >60)
Glucose, Bld: 223 mg/dL — ABNORMAL HIGH (ref 70–99)
Potassium: 3.6 mmol/L (ref 3.5–5.1)
Sodium: 136 mmol/L (ref 135–145)
Total Bilirubin: 1.1 mg/dL (ref 0.3–1.2)
Total Protein: 4.1 g/dL — ABNORMAL LOW (ref 6.5–8.1)

## 2020-06-12 LAB — CBC WITH DIFFERENTIAL/PLATELET
Abs Immature Granulocytes: 0.32 10*3/uL — ABNORMAL HIGH (ref 0.00–0.07)
Basophils Absolute: 0 10*3/uL (ref 0.0–0.1)
Basophils Relative: 0 %
Eosinophils Absolute: 0 10*3/uL (ref 0.0–0.5)
Eosinophils Relative: 0 %
HCT: 29.1 % — ABNORMAL LOW (ref 39.0–52.0)
Hemoglobin: 9.3 g/dL — ABNORMAL LOW (ref 13.0–17.0)
Immature Granulocytes: 2 %
Lymphocytes Relative: 9 %
Lymphs Abs: 1.3 10*3/uL (ref 0.7–4.0)
MCH: 30.7 pg (ref 26.0–34.0)
MCHC: 32 g/dL (ref 30.0–36.0)
MCV: 96 fL (ref 80.0–100.0)
Monocytes Absolute: 0.7 10*3/uL (ref 0.1–1.0)
Monocytes Relative: 5 %
Neutro Abs: 11.8 10*3/uL — ABNORMAL HIGH (ref 1.7–7.7)
Neutrophils Relative %: 84 %
Platelets: 70 10*3/uL — ABNORMAL LOW (ref 150–400)
RBC: 3.03 MIL/uL — ABNORMAL LOW (ref 4.22–5.81)
RDW: 19.3 % — ABNORMAL HIGH (ref 11.5–15.5)
WBC: 14.1 10*3/uL — ABNORMAL HIGH (ref 4.0–10.5)
nRBC: 0 % (ref 0.0–0.2)

## 2020-06-12 LAB — GLUCOSE, CAPILLARY
Glucose-Capillary: 227 mg/dL — ABNORMAL HIGH (ref 70–99)
Glucose-Capillary: 246 mg/dL — ABNORMAL HIGH (ref 70–99)
Glucose-Capillary: 185 mg/dL — ABNORMAL HIGH (ref 70–99)
Glucose-Capillary: 169 mg/dL — ABNORMAL HIGH (ref 70–99)

## 2020-06-12 LAB — PHOSPHORUS: Phosphorus: 1.7 mg/dL — ABNORMAL LOW (ref 2.5–4.6)

## 2020-06-12 LAB — HEPARIN INDUCED PLATELET AB (HIT ANTIBODY): Heparin Induced Plt Ab: 0.052 OD (ref 0.000–0.400)

## 2020-06-12 MED ORDER — LOSARTAN POTASSIUM 25 MG PO TABS
25.0000 mg | ORAL_TABLET | Freq: Every day | ORAL | Status: DC
  Administered 2020-06-12 – 2020-06-14 (×3): 25 mg via ORAL
  Filled 2020-06-12 (×3): qty 1

## 2020-06-12 MED ORDER — FOLIC ACID 1 MG PO TABS
1.0000 mg | ORAL_TABLET | Freq: Every day | ORAL | Status: DC
  Administered 2020-06-13 – 2020-06-20 (×8): 1 mg via ORAL
  Filled 2020-06-12 (×8): qty 1

## 2020-06-12 MED ORDER — THIAMINE HCL 100 MG PO TABS
100.0000 mg | ORAL_TABLET | Freq: Every day | ORAL | Status: DC
  Administered 2020-06-13 – 2020-06-20 (×8): 100 mg via ORAL
  Filled 2020-06-12 (×8): qty 1

## 2020-06-12 MED ORDER — POTASSIUM PHOSPHATES 15 MMOLE/5ML IV SOLN
30.0000 mmol | Freq: Once | INTRAVENOUS | Status: AC
  Administered 2020-06-12: 30 mmol via INTRAVENOUS
  Filled 2020-06-12: qty 10

## 2020-06-12 MED ORDER — ENSURE ENLIVE PO LIQD
237.0000 mL | Freq: Two times a day (BID) | ORAL | Status: DC
  Administered 2020-06-15: 237 mL via ORAL

## 2020-06-12 MED ORDER — PANTOPRAZOLE SODIUM 40 MG PO TBEC
40.0000 mg | DELAYED_RELEASE_TABLET | Freq: Every day | ORAL | Status: DC
  Administered 2020-06-12 – 2020-06-20 (×9): 40 mg via ORAL
  Filled 2020-06-12 (×9): qty 1

## 2020-06-12 NOTE — Care Management Important Message (Signed)
Important Message  Patient Details IM Letter given to [name] RN Case Manager to present to the Patient Name: Tony Marshall MRN: [id number] Date of Birth: 1940/12/30   Medicare Important Message Given:  Yes     Kerin Salen 06/12/2020, 11:29 AM

## 2020-06-12 NOTE — Progress Notes (Addendum)
[facility] PROGRESS NOTE   Tony Marshall OJJ:[id number] DOB: 02-Sep-1941 DOA: 06/05/2020  PCP: Elayne Snare, MD  Brief History/Interval Summary: 79 year old with CLL on Rituxan, [place], diabetes, hypertension, hyperlipidemia admitted with diarrhea, malaise, mild DKA requiring insulin drip. Labs significant for pancytopenia, mild hyperbilirubinemia, electrolyte abnormalities.  Transferred to ICU on 6/22 for hypotension, sepsis with elevated lactic acid. PCCM consulted for help with management.  Had to be placed on pressors.  He stabilized.  He was weaned off of Levophed.  Reason for Visit: Right lower extremity cellulitis with septic shock  Consultants: Cocaine medicine.  Cardiology  Procedures:  Transthoracic echocardiogram 6/23 IMPRESSIONS  1. Left ventricular ejection fraction, by estimation, is 25 to 30%. The  left ventricle has severely decreased function. The left ventricle has no  regional wall motion abnormalities. Left ventricular diastolic parameters  are consistent with Grade II  diastolic dysfunction (pseudonormalization).  2. Right ventricular systolic function is normal. The right ventricular  size is normal. There is normal pulmonary artery systolic pressure.  3. The mitral valve is normal in structure. No evidence of mitral valve  regurgitation. No evidence of mitral stenosis.  4. The aortic valve is grossly normal. Aortic valve regurgitation is not  visualized. No aortic stenosis is present.  5. The inferior vena cava is normal in size with greater than 50%  respiratory variability, suggesting right atrial pressure of 3 mmHg.     Antibiotics: Anti-infectives (From admission, onward)   Start     Dose/Rate Route Frequency Ordered Stop   06/10/20 1230  ceFAZolin (ANCEF) IVPB 2g/100 mL premix     Discontinue     2 g 200 mL/hr over 30 Minutes Intravenous Every 8 hours 06/10/20 1205     06/07/20 0000  vancomycin (VANCOREADY) IVPB 500 mg/100 mL  Status:   Discontinued        500 mg 100 mL/hr over 60 Minutes Intravenous Every 12 hours 06/06/20 1316 06/10/20 1141   06/06/20 1300  ceFEPIme (MAXIPIME) 2 g in sodium chloride 0.9 % 100 mL IVPB  Status:  Discontinued        2 g 200 mL/hr over 30 Minutes Intravenous Every 12 hours 06/06/20 1209 06/10/20 1141   06/06/20 1300  vancomycin (VANCOCIN) IVPB 1000 mg/200 mL premix        1,000 mg 200 mL/hr over 60 Minutes Intravenous  Once 06/06/20 1209 06/06/20 1353      Subjective/Interval History: Patient continues to report poor appetite.  Continues to have pain in the right leg.  His wife is at the bedside.  They feel that the redness has worsened.  No nausea vomiting.  No shortness of breath.     Assessment/Plan:  Right lower extremity cellulitis with sepsis/septic shock in the setting of immunosuppression Patient was on broad-spectrum antibiotics with vancomycin and cefepime.  He developed septic shock and had to be transferred to the ICU.  He required pressors.  He has been off of pressors since 6/25.   His antibiotics were changed over to Ancef on 6/26 after discussions with infectious disease.   The right leg seems to be more erythematous today compared to yesterday.  His WBC is stable.  He remains afebrile.  Continue current antibiotics for now.  Continue to keep the leg elevated.  Lower extremity Doppler study has been negative for DVT.   If does not improve in the next 24 hours or gets worse we may request ID to see him.  Pancytopenia due to CLL and immunosuppression  Was initially leukopenic and now he actually has leukocytosis.  Platelet count is also improved.  Hemoglobin is stable.  No evidence of overt bleeding.  Continue to monitor.    Acute metabolic encephalopathy in the setting of sepsis This appears to be improving.  No evidence for focal neurological deficits.  PT evaluation.  Out of bed to chair.  Acute on chronic combined systolic and diastolic CHF Cardiology is following.   His echocardiogram showed a EF of 25 to 30%.  Patient seems to be euvolemic.  His cardiac medications have been on hold due to septic shock.  Blood pressure noted to be borderline low.  Continue to monitor off of diuretics.  LDL was 61.  Further management per cardiology.  Diabetes mellitus type 2, uncontrolled with hyperglycemia HbA1c 7.7.  Currently on Lantus and SSI.  Takes Tyler Aas at home.  Also on Metformin and Actos.  Actos will need to be stopped due to his CHF.  CBGs reasonably well controlled.  May need to adjust Lantus..  Electrolyte imbalance Phosphorus level was noted to be less than 1.  Improved to 1.7.  Will give another dose of potassium phosphate today.  Potassium is 3.6.  Transaminitis Most likely due to shock and sepsis.  Resolved..  Moderate protein calorie malnutrition Dietitian is following. Nutrition Problem: Moderate Malnutrition Etiology: chronic illness, cancer and cancer related treatments  Signs/Symptoms: mild fat depletion, moderate fat depletion, moderate muscle depletion  Interventions: MVI, Other (Comment) (Beneprotein and [employer])    DVT Prophylaxis: Lovenox on hold due to thrombocytopenia  Code Status: Full code Family Communication: Discussed with his wife Disposition Plan: Status is: Inpatient  Remains inpatient appropriate because:IV treatments appropriate due to intensity of illness or inability to take PO   Dispo:  Patient From: Home  Planned Disposition: To be determined  Expected discharge date: 06/14/20  Medically stable for discharge: No       Medications:  Scheduled: . acetaminophen  650 mg Oral Once  . Chlorhexidine Gluconate Cloth  6 each Topical Daily  . feeding supplement (KATE FARMS STANDARD 1.4)  325 mL Oral BID BM  . folic acid  1 mg Intravenous Daily  . insulin aspart  0-9 Units Subcutaneous TID WC  . insulin glargine  5 Units Subcutaneous Daily  . multivitamin with minerals  1 tablet Oral Daily  . pantoprazole  (PROTONIX) IV  40 mg Intravenous Q24H  . protein supplement  12 g Oral TID WC  . sodium chloride flush  10-40 mL Intracatheter Q12H  . thiamine injection  100 mg Intravenous Daily   Continuous: . sodium chloride    .  ceFAZolin (ANCEF) IV 2 g (06/12/20 0516)  . potassium PHOSPHATE IVPB (in mmol) 30 mmol (06/12/20 0815)   JQB:HALPFXTKWIOXB **OR** [DISCONTINUED] acetaminophen, ALPRAZolam, dextrose, lip balm, naLOXone (NARCAN)  injection, oxymetazoline, sodium chloride flush   Objective:  Vital Signs  Vitals:   06/11/20 1522 06/11/20 2120 06/12/20 0158 06/12/20 0549  BP: 134/80 105/68 115/60 (!) 115/56  Pulse: 73 92 89 89  Resp: 16 18 18 20   Temp: 97.7 F (36.5 C) 98.8 F (37.1 C) 98.9 F (37.2 C) 98.8 F (37.1 C)  TempSrc: Oral Oral Oral Oral  SpO2: 100% 100% 99% 100%  Weight:    65.7 kg  Height:        Intake/Output Summary (Last 24 hours) at 06/12/2020 1113 Last data filed at 06/12/2020 0315 Gross per 24 hour  Intake 489.4 ml  Output 625 ml  Net -135.6  ml   Filed Weights   06/09/20 0500 06/11/20 0400 06/12/20 0549  Weight: 65.9 kg 66 kg 65.7 kg    General appearance: Awake alert.  In no distress Resp: Clear to auscultation bilaterally.  Normal effort Cardio: S1-S2 is normal regular.  No S3-S4.  No rubs murmurs or bruit GI: Abdomen is soft.  Nontender nondistended.  Bowel sounds are present normal.  No masses organomegaly Extremities: Right leg noted to be more swollen compared to the left.  Erythema seems to be slightly worse today compared to yesterday.  Has good peripheral pulses.   Neurologic: No focal neurological deficits.    Lab Results:  Data Reviewed: I have personally reviewed following labs and imaging studies  CBC: Recent Labs  Lab 06/08/20 0434 06/09/20 0400 06/10/20 0432 06/11/20 0348 06/12/20 0410  WBC 2.2* 1.9* 6.3 14.9* 14.1*  NEUTROABS 0.5* 0.8* 4.7 12.2* 11.8*  HGB 11.0* 10.5* 9.9* 9.3* 9.3*  HCT 32.3* 31.5* 30.9* 28.9* 29.1*  MCV  91.8 92.1 93.4 94.4 96.0  PLT 106* 37* 37* 49* 70*    Basic Metabolic Panel: Recent Labs  Lab 06/06/20 0947 06/06/20 1241 06/06/20 1708 06/06/20 1708 06/07/20 0614 06/07/20 0614 06/07/20 2000 06/07/20 2000 06/08/20 0434 06/09/20 0400 06/10/20 0432 06/11/20 0348 06/11/20 1830 06/12/20 0410  NA  --    < > 133*   < > 134*   < > 134*   < > 132* 135 134* 136  --  136  K  --    < > 2.8*   < > 2.7*   < > 4.3   < > 3.9 3.3* 3.9 3.7  --  3.6  CL  --    < > 102   < > 124*   < > 105   < > 107 109 108 110  --  109  CO2  --    < > 22   < > 19*   < > 19*   < > 17* 20* 18* 20*  --  19*  GLUCOSE  --    < > 114*   < > 81   < > 159*   < > 137* 78 128* 142*  --  223*  BUN  --    < > 23   < > 22   < > 34*   < > 39* 40* 34* 26*  --  23  CREATININE  --    < > 1.09   < > 1.20   < > 1.14   < > 1.12 1.01 0.84 0.82  --  0.80  CALCIUM  --    < > 7.1*   < > 5.2*   < > 6.9*   < > 6.8* 7.0* 7.2* 7.4*  --  7.4*  MG 1.5*  --  2.1  --  1.6*  --  2.5*  --   --   --  1.9  --   --   --   PHOS  --   --   --   --  1.5*  --  3.5  --   --   --   --  <1.0* 1.9* 1.7*   < > = values in this interval not displayed.    GFR: Estimated Creatinine Clearance: 73 mL/min (by C-G formula based on SCr of 0.8 mg/dL).  Liver Function Tests: Recent Labs  Lab 06/07/20 0614 06/08/20 0434 06/10/20 0432 06/11/20 0348 06/12/20 0410  AST 87* 61* 25 20 16   ALT  67* 102* 64* 47* 27  ALKPHOS 35* 46 65 72 80  BILITOT 1.7* 2.0* 1.4* 1.1 1.1  PROT 3.5* 4.1* 4.2* 4.0* 4.1*  ALBUMIN 1.4* 1.9* 1.8* 1.8* 2.0*     Recent Labs  Lab 06/06/20 0435  AMMONIA 20    Cardiac Enzymes: Recent Labs  Lab 06/10/20 1128  CKTOTAL 87  CKMB 22.2*    CBG: Recent Labs  Lab 06/11/20 0809 06/11/20 1210 06/11/20 1733 06/11/20 2116 06/12/20 0757  GLUCAP 177* 169* 100* 181* 227*    Lipid Profile: Recent Labs    06/10/20 0432  CHOL 98  HDL 5*  LDLCALC 61  TRIG 160*  CHOLHDL 19.6      Recent Results (from the past 240  hour(s))  SARS Coronavirus 2 by RT PCR (hospital order, performed in [facility] hospital lab) Nasopharyngeal Nasopharyngeal Swab     Status: None   Collection Time: 06/05/20  6:23 PM   Specimen: Nasopharyngeal Swab  Result Value Ref Range Status   SARS Coronavirus 2 NEGATIVE NEGATIVE Final    Comment: (NOTE) SARS-CoV-2 target nucleic acids are NOT DETECTED.  The SARS-CoV-2 RNA is generally detectable in upper and lower respiratory specimens during the acute phase of infection. The lowest concentration of SARS-CoV-2 viral copies this assay can detect is 250 copies / mL. A negative result does not preclude SARS-CoV-2 infection and should not be used as the sole basis for treatment or other patient management decisions.  A negative result may occur with improper specimen collection / handling, submission of specimen other than nasopharyngeal swab, presence of viral mutation(s) within the areas targeted by this assay, and inadequate number of viral copies (<250 copies / mL). A negative result must be combined with clinical observations, patient history, and epidemiological information.  Fact Sheet for Patients:   StrictlyIdeas.no  Fact Sheet for Healthcare Providers: BankingDealers.co.za  This test is not yet approved or  cleared by the [place] FDA and has been authorized for detection and/or diagnosis of SARS-CoV-2 by FDA under an Emergency Use Authorization (EUA).  This EUA will remain in effect (meaning this test can be used) for the duration of the COVID-19 declaration under Section 564(b)(1) of the Act, 21 U.S.C. section 360bbb-3(b)(1), unless the authorization is terminated or revoked sooner.  Performed at [facility], [place] [address], [place], [place] [address]   Culture, blood (Routine X 2) w Reflex to ID Panel     Status: None   Collection Time: 06/06/20 12:42 PM   Specimen: BLOOD RIGHT HAND  Result  Value Ref Range Status   Specimen Description   Final    BLOOD RIGHT HAND Performed at [place] [address] [place], [place] [address]    Special Requests   Final    BOTTLES DRAWN AEROBIC ONLY Blood Culture results may not be optimal due to an inadequate volume of blood received in culture bottles Performed at [place] [address] [place], [place] [address]    Culture   Final    NO GROWTH 5 DAYS Performed at [facility], [place] [address] [place],  [address]    Report Status 06/11/2020 FINAL  Final  Culture, blood (Routine X 2) w Reflex to ID Panel     Status: None   Collection Time: 06/06/20 12:42 PM   Specimen: BLOOD LEFT HAND  Result Value Ref Range Status   Specimen Description   Final    BLOOD LEFT HAND Performed  at [facility], [place] [address], [place], [place] [address]    Special Requests   Final    BOTTLES DRAWN AEROBIC ONLY Blood Culture results may not be optimal due to an inadequate volume of blood received in culture bottles Performed at [place] [address] [place], [place] [address]    Culture   Final    NO GROWTH 5 DAYS Performed at [facility], [place] [address], [place], [place] [address]    Report Status 06/11/2020 FINAL  Final  MRSA PCR Screening     Status: None   Collection Time: 06/06/20  5:45 PM   Specimen: Nasal Mucosa; Nasopharyngeal  Result Value Ref Range Status   MRSA by PCR NEGATIVE NEGATIVE Final    Comment:        The GeneXpert MRSA Assay (FDA approved for NASAL specimens only), is one component of a comprehensive MRSA colonization surveillance program. It is not intended to diagnose MRSA infection nor to guide or monitor treatment for MRSA infections. Performed at [facility], [place] [address], [place], [place] [address]   Culture, Urine     Status: None   Collection Time: 06/06/20  6:38 PM    Specimen: Urine, Catheterized  Result Value Ref Range Status   Specimen Description   Final    URINE, CATHETERIZED Performed at [place] [address] [place], [place] [address]    Special Requests   Final    NONE Performed at [facility], [place] [address], [place], [place] [address]    Culture   Final    NO GROWTH Performed at [facility], [place] [address], [place], [place] [address]    Report Status 06/08/2020 FINAL  Final  C Difficile Quick Screen w PCR reflex     Status: None   Collection Time: 06/07/20  1:30 AM   Specimen: STOOL  Result Value Ref Range Status   C Diff antigen NEGATIVE NEGATIVE Final   C Diff toxin NEGATIVE NEGATIVE Final   C Diff interpretation No C. difficile detected.  Final    Comment: Performed at [facility], [place] [address], [place], [place] [address]      Radiology Studies: VAS [place] LOWER EXTREMITY VENOUS (DVT)  Result Date: 06/11/2020  Lower Venous DVTStudy Indications: Swelling.  Risk Factors: None identified. Comparison Study: 06/07/2020 - Negative for DVT. Performing Technologist: Oliver Hum RVT  Examination Guidelines: A complete evaluation includes B-mode imaging, spectral Doppler, color Doppler, and power Doppler as needed of all accessible portions of each vessel. Bilateral testing is considered an integral part of a complete examination. Limited examinations for reoccurring indications may be performed as noted. The reflux portion of the exam is performed with the patient in reverse Trendelenburg.  +---------+---------------+---------+-----------+----------+--------------+ RIGHT    CompressibilityPhasicitySpontaneityPropertiesThrombus Aging +---------+---------------+---------+-----------+----------+--------------+ CFV      Full           Yes      Yes                                 +---------+---------------+---------+-----------+----------+--------------+ SFJ       Full                                                        +---------+---------------+---------+-----------+----------+--------------+  FV Prox  Full                                                        +---------+---------------+---------+-----------+----------+--------------+ FV Mid   Full                                                        +---------+---------------+---------+-----------+----------+--------------+ FV DistalFull                                                        +---------+---------------+---------+-----------+----------+--------------+ PFV      Full                                                        +---------+---------------+---------+-----------+----------+--------------+ POP      Full           Yes      Yes                                 +---------+---------------+---------+-----------+----------+--------------+ PTV      Full                                                        +---------+---------------+---------+-----------+----------+--------------+ PERO     Full                                                        +---------+---------------+---------+-----------+----------+--------------+   +----+---------------+---------+-----------+----------+--------------+ LEFTCompressibilityPhasicitySpontaneityPropertiesThrombus Aging +----+---------------+---------+-----------+----------+--------------+ CFV Full           Yes      Yes                                 +----+---------------+---------+-----------+----------+--------------+     Summary: RIGHT: - There is no evidence of deep vein thrombosis in the lower extremity.  - No cystic structure found in the popliteal fossa.  LEFT: - No evidence of common femoral vein obstruction.  *See table(s) above for measurements and observations. Electronically signed by Deitra Mayo MD on 06/11/2020 at 6:52:41 PM.    Final        LOS: 7 days   [facility] Pager on [email or web address]  06/12/2020, 11:13 AM

## 2020-06-12 NOTE — Progress Notes (Signed)
Progress Note  Patient Name: Tony Marshall Date of Encounter: 06/12/2020  [facility] Cardiologist: Skeet Latch, MD   Subjective   Feeling improved though not back to baseline. Daughter at bedside as well. Reviewed his echo, management of reduced EF.  Inpatient Medications    Scheduled Meds: . acetaminophen  650 mg Oral Once  . Chlorhexidine Gluconate Cloth  6 each Topical Daily  . feeding supplement (KATE FARMS STANDARD 1.4)  325 mL Oral BID BM  . [START ON 3/41/9622] folic acid  1 mg Oral Daily  . insulin aspart  0-9 Units Subcutaneous TID WC  . insulin glargine  5 Units Subcutaneous Daily  . losartan  25 mg Oral Daily  . multivitamin with minerals  1 tablet Oral Daily  . pantoprazole  40 mg Oral Daily  . protein supplement  12 g Oral TID WC  . sodium chloride flush  10-40 mL Intracatheter Q12H  . [START ON 06/13/2020] thiamine  100 mg Oral Daily   Continuous Infusions: . sodium chloride    .  ceFAZolin (ANCEF) IV 2 g (06/12/20 1514)   PRN Meds: acetaminophen **OR** [DISCONTINUED] acetaminophen, ALPRAZolam, dextrose, lip balm, naLOXone (NARCAN)  injection, oxymetazoline, sodium chloride flush   Vital Signs    Vitals:   06/11/20 2120 06/12/20 0158 06/12/20 0549 06/12/20 1246  BP: 105/68 115/60 (!) 115/56 (!) 153/77  Pulse: 92 89 89 82  Resp: 18 18 20 18   Temp: 98.8 F (37.1 C) 98.9 F (37.2 C) 98.8 F (37.1 C) 98.7 F (37.1 C)  TempSrc: Oral Oral Oral Oral  SpO2: 100% 99% 100% 99%  Weight:   65.7 kg   Height:        Intake/Output Summary (Last 24 hours) at 06/12/2020 1534 Last data filed at 06/12/2020 1400 Gross per 24 hour  Intake 992.55 ml  Output 625 ml  Net 367.55 ml   Last 3 Weights 06/12/2020 06/11/2020 06/09/2020  Weight (lbs) 144 lb 14.4 oz 145 lb 8.1 oz 145 lb 4.5 oz  Weight (kg) 65.726 kg 66 kg 65.9 kg      Telemetry    NSR - Personally Reviewed  ECG    06/05/20  NSR with TWI - Personally Reviewed  Physical Exam   GEN: No  acute distress.  Appears frail Neck: No JVD Cardiac: RRR, no murmurs, rubs, or gallops.  Respiratory: Clear to auscultation bilaterally. GI: Soft, nontender, non-distended  MS: RLE erythematous but within drawn margins. No significant LE edema Neuro:  Nonfocal  Psych: Normal affect   Labs    High Sensitivity Troponin:   Recent Labs  Lab 05/16/20 1303 05/16/20 1640 06/05/20 1710  TROPONINIHS 9 8 11       Chemistry Recent Labs  Lab 06/10/20 0432 06/11/20 0348 06/12/20 0410  NA 134* 136 136  K 3.9 3.7 3.6  CL 108 110 109  CO2 18* 20* 19*  GLUCOSE 128* 142* 223*  BUN 34* 26* 23  CREATININE 0.84 0.82 0.80  CALCIUM 7.2* 7.4* 7.4*  PROT 4.2* 4.0* 4.1*  ALBUMIN 1.8* 1.8* 2.0*  AST 25 20 16   ALT 64* 47* 27  ALKPHOS 65 72 80  BILITOT 1.4* 1.1 1.1  GFRNONAA >60 >60 >60  GFRAA >60 >60 >60  ANIONGAP 8 6 8      Hematology Recent Labs  Lab 06/10/20 0432 06/11/20 0348 06/12/20 0410  WBC 6.3 14.9* 14.1*  RBC 3.31* 3.06* 3.03*  HGB 9.9* 9.3* 9.3*  HCT 30.9* 28.9* 29.1*  MCV 93.4 94.4  96.0  MCH 29.9 30.4 30.7  MCHC 32.0 32.2 32.0  RDW 19.6* 19.5* 19.3*  PLT 37* 49* 70*    BNPNo results for input(s): BNP, PROBNP in the last 168 hours.   DDimer No results for input(s): DDIMER in the last 168 hours.   Radiology    VAS [place] LOWER EXTREMITY VENOUS (DVT)  Result Date: 06/11/2020  Lower Venous DVTStudy Indications: Swelling.  Risk Factors: None identified. Comparison Study: 06/07/2020 - Negative for DVT. Performing Technologist: Oliver Hum RVT  Examination Guidelines: A complete evaluation includes B-mode imaging, spectral Doppler, color Doppler, and power Doppler as needed of all accessible portions of each vessel. Bilateral testing is considered an integral part of a complete examination. Limited examinations for reoccurring indications may be performed as noted. The reflux portion of the exam is performed with the patient in reverse Trendelenburg.   +---------+---------------+---------+-----------+----------+--------------+ RIGHT    CompressibilityPhasicitySpontaneityPropertiesThrombus Aging +---------+---------------+---------+-----------+----------+--------------+ CFV      Full           Yes      Yes                                 +---------+---------------+---------+-----------+----------+--------------+ SFJ      Full                                                        +---------+---------------+---------+-----------+----------+--------------+ FV Prox  Full                                                        +---------+---------------+---------+-----------+----------+--------------+ FV Mid   Full                                                        +---------+---------------+---------+-----------+----------+--------------+ FV DistalFull                                                        +---------+---------------+---------+-----------+----------+--------------+ PFV      Full                                                        +---------+---------------+---------+-----------+----------+--------------+ POP      Full           Yes      Yes                                 +---------+---------------+---------+-----------+----------+--------------+ PTV      Full                                                        +---------+---------------+---------+-----------+----------+--------------+  PERO     Full                                                        +---------+---------------+---------+-----------+----------+--------------+   +----+---------------+---------+-----------+----------+--------------+ LEFTCompressibilityPhasicitySpontaneityPropertiesThrombus Aging +----+---------------+---------+-----------+----------+--------------+ CFV Full           Yes      Yes                                  +----+---------------+---------+-----------+----------+--------------+     Summary: RIGHT: - There is no evidence of deep vein thrombosis in the lower extremity.  - No cystic structure found in the popliteal fossa.  LEFT: - No evidence of common femoral vein obstruction.  *See table(s) above for measurements and observations. Electronically signed by Deitra Mayo MD on 06/11/2020 at 6:52:41 PM.    Final     Cardiac Studies   Echo 06/07/20 1. Left ventricular ejection fraction, by estimation, is 25 to 30%. The  left ventricle has severely decreased function. The left ventricle has no  regional wall motion abnormalities. Left ventricular diastolic parameters  are consistent with Grade II  diastolic dysfunction (pseudonormalization).  2. Right ventricular systolic function is normal. The right ventricular  size is normal. There is normal pulmonary artery systolic pressure.  3. The mitral valve is normal in structure. No evidence of mitral valve  regurgitation. No evidence of mitral stenosis.  4. The aortic valve is grossly normal. Aortic valve regurgitation is not  visualized. No aortic stenosis is present.  5. The inferior vena cava is normal in size with greater than 50%  respiratory variability, suggesting right atrial pressure of 3 mmHg.   Patient Profile     79 y.o. male with PMH hypertension, hyperlipidemia, type II diabetes, CLL on rituxin and bendeka. He was admitted with mild DKA, malaise, diarrhea, RLE cellulitis complicated by septic shock requiring ICU and pressors this admission. Cardiology consulted for new cardiomyopathy  Assessment & Plan    Cardiomyopathy, unknown etiology: -has been running low CVP and requiring IV fluids in the setting of septic shock, has not required diuresis -would aim for goal directed medical therapy while admitted and consider right and left heart cath as an outpatient. Discussed this with patient and his daughter today, they agree with no  cath this admission. -holding aspirin 81 mg daily with anemia/thrombocytopenia, though this appears to be stable to slightly improving -restart rosuvastatin 5 mg daily prior to discharge, as AST/ALT normalizing. LDL 61 on lipid panel this admission -consider SGLT2i -will start low dose losartan (valsartan not formulary) given rising BP. If he tolerates, will start entresto -next would be to restart beta blocker at a low dose and uptitrate  Hypertension history, with hypotension/shock earlier this admission: -blood pressure slowly improved. Had been hypotensive and earlier required pressors.  -Had hypertension as an outpatient. Was on benazepril 20 mg daily and carvedilol 25 mg BID, in addition to amlodipine 5 mg daily and doxazosin 2 mg daily. -trialing ARB as above, with potential for entresto -next is to restart beta blocker  Type II diabetes: -with low EF, stop pioglitazone -would consider SGLT2i given reduced EF and diabetes  CLL, on rituxin and bendeka -per primary team and oncology  For questions or updates, please contact [place]  Please consult [email or web address] for contact info under   Signed, Buford Dresser, MD  06/12/2020, 3:34 PM

## 2020-06-13 ENCOUNTER — Other Ambulatory Visit: Payer: Medicare HMO

## 2020-06-13 LAB — COMPREHENSIVE METABOLIC PANEL
ALT: 13 U/L (ref 0–44)
AST: 11 U/L — ABNORMAL LOW (ref 15–41)
Albumin: 2.7 g/dL — ABNORMAL LOW (ref 3.5–5.0)
Alkaline Phosphatase: 33 U/L — ABNORMAL LOW (ref 38–126)
Anion gap: 10 (ref 5–15)
BUN: 8 mg/dL (ref 8–23)
CO2: 29 mmol/L (ref 22–32)
Calcium: 8.1 mg/dL — ABNORMAL LOW (ref 8.9–10.3)
Chloride: 99 mmol/L (ref 98–111)
Creatinine, Ser: 0.88 mg/dL (ref 0.61–1.24)
GFR calc Af Amer: >60 mL/min (ref >60)
GFR calc non Af Amer: >60 mL/min (ref >60)
Glucose, Bld: 99 mg/dL (ref 70–99)
Potassium: 3.4 mmol/L — ABNORMAL LOW (ref 3.5–5.1)
Sodium: 138 mmol/L (ref 135–145)
Total Bilirubin: 0.8 mg/dL (ref 0.3–1.2)
Total Protein: 5 g/dL — ABNORMAL LOW (ref 6.5–8.1)

## 2020-06-13 LAB — CBC WITH DIFFERENTIAL/PLATELET
Abs Immature Granulocytes: 0.18 10*3/uL — ABNORMAL HIGH (ref 0.00–0.07)
Basophils Absolute: 0 10*3/uL (ref 0.0–0.1)
Basophils Relative: 0 %
Eosinophils Absolute: 0 10*3/uL (ref 0.0–0.5)
Eosinophils Relative: 0 %
HCT: 28.1 % — ABNORMAL LOW (ref 39.0–52.0)
Hemoglobin: 9.1 g/dL — ABNORMAL LOW (ref 13.0–17.0)
Immature Granulocytes: 2 %
Lymphocytes Relative: 11 %
Lymphs Abs: 1.1 10*3/uL (ref 0.7–4.0)
MCH: 30.3 pg (ref 26.0–34.0)
MCHC: 32.4 g/dL (ref 30.0–36.0)
MCV: 93.7 fL (ref 80.0–100.0)
Monocytes Absolute: 0.7 10*3/uL (ref 0.1–1.0)
Monocytes Relative: 6 %
Neutro Abs: 8.4 10*3/uL — ABNORMAL HIGH (ref 1.7–7.7)
Neutrophils Relative %: 81 %
Platelets: 98 10*3/uL — ABNORMAL LOW (ref 150–400)
RBC: 3 MIL/uL — ABNORMAL LOW (ref 4.22–5.81)
RDW: 18.6 % — ABNORMAL HIGH (ref 11.5–15.5)
WBC: 10.4 10*3/uL (ref 4.0–10.5)
nRBC: 0 % (ref 0.0–0.2)

## 2020-06-13 LAB — GLUCOSE 6 PHOSPHATE DEHYDROGENASE
G6PDH: 11 U/g{Hb} (ref 4.8–15.7)
Hemoglobin: 10.8 g/dL — ABNORMAL LOW (ref 13.0–17.7)

## 2020-06-13 LAB — GLUCOSE, CAPILLARY
Glucose-Capillary: 190 mg/dL — ABNORMAL HIGH (ref 70–99)
Glucose-Capillary: 107 mg/dL — ABNORMAL HIGH (ref 70–99)
Glucose-Capillary: 36 mg/dL — CL (ref 70–99)
Glucose-Capillary: 87 mg/dL (ref 70–99)
Glucose-Capillary: 174 mg/dL — ABNORMAL HIGH (ref 70–99)

## 2020-06-13 LAB — PHOSPHORUS: Phosphorus: 3.4 mg/dL (ref 2.5–4.6)

## 2020-06-13 MED ORDER — ROSUVASTATIN CALCIUM 5 MG PO TABS
5.0000 mg | ORAL_TABLET | Freq: Every day | ORAL | Status: DC
  Administered 2020-06-13 – 2020-06-19 (×7): 5 mg via ORAL
  Filled 2020-06-13 (×7): qty 1

## 2020-06-13 MED ORDER — SENNA 8.6 MG PO TABS
1.0000 | ORAL_TABLET | Freq: Every day | ORAL | Status: DC
  Administered 2020-06-13 – 2020-06-19 (×7): 8.6 mg via ORAL
  Filled 2020-06-13 (×7): qty 1

## 2020-06-13 MED ORDER — TRAMADOL HCL 50 MG PO TABS
50.0000 mg | ORAL_TABLET | Freq: Three times a day (TID) | ORAL | Status: DC | PRN
  Administered 2020-06-13 – 2020-06-14 (×3): 50 mg via ORAL
  Filled 2020-06-13 (×3): qty 1

## 2020-06-13 MED ORDER — POTASSIUM CHLORIDE CRYS ER 20 MEQ PO TBCR
40.0000 meq | EXTENDED_RELEASE_TABLET | Freq: Once | ORAL | Status: AC
  Administered 2020-06-13: 40 meq via ORAL
  Filled 2020-06-13: qty 2

## 2020-06-13 MED ORDER — ENOXAPARIN SODIUM 40 MG/0.4ML ~~LOC~~ SOLN
40.0000 mg | SUBCUTANEOUS | Status: DC
  Administered 2020-06-13 – 2020-06-20 (×8): 40 mg via SUBCUTANEOUS
  Filled 2020-06-13 (×8): qty 0.4

## 2020-06-13 MED ORDER — CARVEDILOL 3.125 MG PO TABS
3.1250 mg | ORAL_TABLET | Freq: Two times a day (BID) | ORAL | Status: DC
  Administered 2020-06-13 – 2020-06-15 (×4): 3.125 mg via ORAL
  Filled 2020-06-13 (×4): qty 1

## 2020-06-13 NOTE — Progress Notes (Signed)
Progress Note  Patient Name: Tony Marshall Date of Encounter: 06/13/2020  Primary Cardiologist: Skeet Latch, MD   Subjective   Has some back pain this morning. No complaints of chest pain, SOB, or palpitations.   Inpatient Medications    Scheduled Meds: . acetaminophen  650 mg Oral Once  . Chlorhexidine Gluconate Cloth  6 each Topical Daily  . feeding supplement (ENSURE ENLIVE)  237 mL Oral BID BM  . feeding supplement (KATE FARMS STANDARD 1.4)  325 mL Oral BID BM  . folic acid  1 mg Oral Daily  . insulin aspart  0-9 Units Subcutaneous TID WC  . insulin glargine  5 Units Subcutaneous Daily  . losartan  25 mg Oral Daily  . multivitamin with minerals  1 tablet Oral Daily  . pantoprazole  40 mg Oral Daily  . protein supplement  12 g Oral TID WC  . senna  1 tablet Oral Daily  . sodium chloride flush  10-40 mL Intracatheter Q12H  . thiamine  100 mg Oral Daily   Continuous Infusions: . sodium chloride    .  ceFAZolin (ANCEF) IV 2 g (06/13/20 0500)   PRN Meds: acetaminophen **OR** [DISCONTINUED] acetaminophen, ALPRAZolam, dextrose, lip balm, naLOXone (NARCAN)  injection, oxymetazoline, sodium chloride flush, traMADol   Vital Signs    Vitals:   06/12/20 1246 06/12/20 2103 06/13/20 0442 06/13/20 0500  BP: (!) 153/77 125/79 123/63   Pulse: 82 74 89   Resp: 18 19 18    Temp: 98.7 F (37.1 C) 97.7 F (36.5 C) 98.4 F (36.9 C)   TempSrc: Oral Oral Oral   SpO2: 99% 100% 98%   Weight:    65.5 kg  Height:        Intake/Output Summary (Last 24 hours) at 06/13/2020 1015 Last data filed at 06/13/2020 0800 Gross per 24 hour  Intake 1485.19 ml  Output 1000 ml  Net 485.19 ml   Filed Weights   06/11/20 0400 06/12/20 0549 06/13/20 0500  Weight: 66 kg 65.7 kg 65.5 kg    Telemetry    Sinus rhythm - Personally Reviewed  ECG    No new tracings - Personally Reviewed  Physical Exam   GEN: No acute distress.   Neck: No JVD, no carotid bruits Cardiac: RRR, no  murmurs, rubs, or gallops.  Respiratory: Clear to auscultation bilaterally, no wheezes/ rales/ rhonchi GI: NABS, Soft, nontender, non-distended  MS: 1-2+ RLE edema with erythema ; No deformity. Neuro:  Nonfocal, moving all extremities spontaneously Psych: Normal affect   Labs    Chemistry Recent Labs  Lab 06/11/20 0348 06/12/20 0410 06/13/20 0402  NA 136 136 138  K 3.7 3.6 3.4*  CL 110 109 99  CO2 20* 19* 29  GLUCOSE 142* 223* 99  BUN 26* 23 8  CREATININE 0.82 0.80 0.88  CALCIUM 7.4* 7.4* 8.1*  PROT 4.0* 4.1* 5.0*  ALBUMIN 1.8* 2.0* 2.7*  AST 20 16 11*  ALT 47* 27 13  ALKPHOS 72 80 33*  BILITOT 1.1 1.1 0.8  GFRNONAA >60 >60 >60  GFRAA >60 >60 >60  ANIONGAP 6 8 10      Hematology Recent Labs  Lab 06/11/20 0348 06/12/20 0410 06/13/20 0402  WBC 14.9* 14.1* 10.4  RBC 3.06* 3.03* 3.00*  HGB 9.3* 9.3* 9.1*  HCT 28.9* 29.1* 28.1*  MCV 94.4 96.0 93.7  MCH 30.4 30.7 30.3  MCHC 32.2 32.0 32.4  RDW 19.5* 19.3* 18.6*  PLT 49* 70* 98*    Cardiac EnzymesNo  results for input(s): TROPONINI in the last 168 hours. No results for input(s): TROPIPOC in the last 168 hours.   BNPNo results for input(s): BNP, PROBNP in the last 168 hours.   DDimer No results for input(s): DDIMER in the last 168 hours.   Radiology    No results found.  Cardiac Studies   Echo 06/07/20 1. Left ventricular ejection fraction, by estimation, is 25 to 30%. The  left ventricle has severely decreased function. The left ventricle has no  regional wall motion abnormalities. Left ventricular diastolic parameters  are consistent with Grade II  diastolic dysfunction (pseudonormalization).  2. Right ventricular systolic function is normal. The right ventricular  size is normal. There is normal pulmonary artery systolic pressure.  3. The mitral valve is normal in structure. No evidence of mitral valve  regurgitation. No evidence of mitral stenosis.  4. The aortic valve is grossly normal. Aortic  valve regurgitation is not  visualized. No aortic stenosis is present.  5. The inferior vena cava is normal in size with greater than 50%  respiratory variability, suggesting right atrial pressure of 3 mmHg.  Patient Profile     79 y.o. male with PMH hypertension, hyperlipidemia, type II diabetes, CLL on rituxin and bendeka. He was admitted with mild DKA, malaise, diarrhea, RLE cellulitis complicated by septic shock requiring ICU and pressors this admission. Cardiology consulted for new cardiomyopathy  Assessment & Plan    1. New onset combined CHF/cardiomyopathy: Echo this admission with EF 25-30%, no RWMA, G2DD, and no significant valvular abnormalities. Etiology unknown. No anginal complaints. Addition of aspirin is limited by anemia/thrombocytopenia. After discussion with patient/family, plan is for medical management while admitted and pursue an outpatient R/LHC. He appears euvolemic on exam. I&Os are incomplete with several unmeasured urine output occurrences.   - Will restart carvedilol at lower dose - 3.125mg  BID (on 25 BID at home) - Will restart crestor as LFTs have normalized (LDL 61 this admission) - Continue losartan with plans to transition to entresto if tolerated - Consider addition of SGLT2-inhibitor  - Continue to monitor volume status closely given aggressive volume repletion this admission.   2. HTN: hospital course complicated by hypotension/shock earlier this admission in the setting of mild DKA and RLE cellulitis. BP improved. Losartan started yesterday with stable BP's over the past 24 hours - Will restart carvedilol at lower dose - 3.125mg  BID (on 25 BID at home) - Continue losartan with plans to transition to entresto as tolerated.   3. DM type 2: patient with mild DKA on admission. A1C 7.7 this admission. Home actos discontinued in the setting of new cardiomyopathy.  - Recommend addition of SGLT-2 inhibitor - will defer management to primary team  4. RLE  cellulitis c/b septic shock: likely occurred 2/2 immunosuppression with ongoing chemotherapy. Improving on IV antibiotics with hopeful transition to po antibiotics in the next 1-2 days.  - Continue management per primary team  5. CLL: on chemotherapy. Blood counts are stable - Continue management per primary team and oncology    For questions or updates, please contact [place] Please consult [email or web address] for contact info under Cardiology/STEMI.      Signed, Abigail Butts, PA-C  06/13/2020, 10:15 AM   [phone]

## 2020-06-13 NOTE — Progress Notes (Signed)
[facility] PROGRESS NOTE   Tony Marshall TML:[id number] DOB: [birth year] DOA: 06/05/2020  PCP: Elayne Snare, MD  Brief History/Interval Summary: 79 year old with CLL on Rituxan, [place], diabetes, hypertension, hyperlipidemia admitted with diarrhea, malaise, mild DKA requiring insulin drip. Labs significant for pancytopenia, mild hyperbilirubinemia, electrolyte abnormalities.  Transferred to ICU on 6/22 for hypotension, sepsis with elevated lactic acid. PCCM consulted for help with management.  Had to be placed on pressors.  He stabilized.  He was weaned off of Levophed.  Reason for Visit: Right lower extremity cellulitis with septic shock  Consultants: Cocaine medicine.  Cardiology  Procedures:  Transthoracic echocardiogram 6/23 IMPRESSIONS  1. Left ventricular ejection fraction, by estimation, is 25 to 30%. The  left ventricle has severely decreased function. The left ventricle has no  regional wall motion abnormalities. Left ventricular diastolic parameters  are consistent with Grade II  diastolic dysfunction (pseudonormalization).  2. Right ventricular systolic function is normal. The right ventricular  size is normal. There is normal pulmonary artery systolic pressure.  3. The mitral valve is normal in structure. No evidence of mitral valve  regurgitation. No evidence of mitral stenosis.  4. The aortic valve is grossly normal. Aortic valve regurgitation is not  visualized. No aortic stenosis is present.  5. The inferior vena cava is normal in size with greater than 50%  respiratory variability, suggesting right atrial pressure of 3 mmHg.     Antibiotics: Anti-infectives (From admission, onward)   Start     Dose/Rate Route Frequency Ordered Stop   06/10/20 1230  ceFAZolin (ANCEF) IVPB 2g/100 mL premix     Discontinue     2 g 200 mL/hr over 30 Minutes Intravenous Every 8 hours 06/10/20 1205     06/07/20 0000  vancomycin (VANCOREADY) IVPB 500 mg/100 mL  Status:   Discontinued        500 mg 100 mL/hr over 60 Minutes Intravenous Every 12 hours 06/06/20 1316 06/10/20 1141   06/06/20 1300  ceFEPIme (MAXIPIME) 2 g in sodium chloride 0.9 % 100 mL IVPB  Status:  Discontinued        2 g 200 mL/hr over 30 Minutes Intravenous Every 12 hours 06/06/20 1209 06/10/20 1141   06/06/20 1300  vancomycin (VANCOCIN) IVPB 1000 mg/200 mL premix        1,000 mg 200 mL/hr over 60 Minutes Intravenous  Once 06/06/20 1209 06/06/20 1353      Subjective/Interval History: Patient mentions some constipation this morning.  Also complains of pain in his right leg even though the redness is better this morning.  Denies any nausea vomiting.  Denies any difficulty breathing.   Assessment/Plan:  Right lower extremity cellulitis with sepsis/septic shock in the setting of immunosuppression Patient was on broad-spectrum antibiotics with vancomycin and cefepime.  He developed septic shock and had to be transferred to the ICU.  He required pressors.  He has been off of pressors since 6/25.   His antibiotics were changed over to Ancef on 6/26 after discussions with infectious disease. On 6/28 his right leg seem to be more erythematous.  Since he had been started on Ancef just 24 hours previously be continued with the current management.  Today his leg looks much better.  Erythema seems to have improved.  He does have some pain though.  Doppler studies were negative for DVT.  We will put him on tramadol with stool softeners.  Continue Ancef for now.  WBC is noted to be better as well. Plan will be  to continue intravenous antibiotics for at least 48 hours more and if there is continued improvement in the right leg could change him to oral antibiotics at that point in time.  Pancytopenia due to CLL and immunosuppression He was initially leukopenic and then progressed to leukocytosis, which seems to be improving now.  His platelet counts are improving.  His hemoglobin is stable.  Continue to  monitor.    Acute metabolic encephalopathy in the setting of sepsis This appears to be improving.  No evidence for focal neurological deficits.  PT evaluation.  Out of bed to chair.  Acute on chronic combined systolic and diastolic CHF Cardiology is following.  His echocardiogram showed a EF of 25 to 30%.  Patient seems to be euvolemic.  His cardiac medications have been on hold due to septic shock.   Blood pressures were borderline low but seems to have improved.  Cardiology reintroducing medications 1 at a time.  LDL was 61.  Further management per cardiology.    Diabetes mellitus type 2, uncontrolled with hyperglycemia HbA1c 7.7.  Currently on Lantus and SSI.  Takes Tyler Aas at home.  Also on Metformin and Actos.  Actos will need to be stopped due to his CHF.  Ideally he should be off of Metformin as well.  CBGs are reasonably well controlled.  Continue current dose of Lantus.    Electrolyte imbalance Phosphorus level was noted to be less than 1.  Given 2 doses of potassium and phosphate with improvement in phosphorus levels.  Will replace potassium today.    Transaminitis Most likely due to shock and sepsis.  Resolved..  Moderate protein calorie malnutrition Dietitian is following. Nutrition Problem: Moderate Malnutrition Etiology: chronic illness, cancer and cancer related treatments  Signs/Symptoms: mild fat depletion, moderate fat depletion, moderate muscle depletion  Interventions: MVI, Other (Comment) (Beneprotein and Dillard Essex)    DVT Prophylaxis: Patient was not on any heparin products due to thrombocytopenia.  Now that his platelet counts have improved we could place him back on Lovenox. Code Status: Full code Family Communication: Discussed with his wife Disposition Plan: Status is: Inpatient  Remains inpatient appropriate because:IV treatments appropriate due to intensity of illness or inability to take PO   Dispo:  Patient From: Home  Planned Disposition: [place]  Expected discharge date: 06/15/20  Medically stable for discharge: No       Medications:  Scheduled: . acetaminophen  650 mg Oral Once  . Chlorhexidine Gluconate Cloth  6 each Topical Daily  . feeding supplement (ENSURE ENLIVE)  237 mL Oral BID BM  . feeding supplement (KATE FARMS STANDARD 1.4)  325 mL Oral BID BM  . folic acid  1 mg Oral Daily  . insulin aspart  0-9 Units Subcutaneous TID WC  . insulin glargine  5 Units Subcutaneous Daily  . losartan  25 mg Oral Daily  . multivitamin with minerals  1 tablet Oral Daily  . pantoprazole  40 mg Oral Daily  . protein supplement  12 g Oral TID WC  . senna  1 tablet Oral Daily  . sodium chloride flush  10-40 mL Intracatheter Q12H  . thiamine  100 mg Oral Daily   Continuous: . sodium chloride    .  ceFAZolin (ANCEF) IV 2 g (06/13/20 0500)   HYI:FOYDXAJOINOMV **OR** [DISCONTINUED] acetaminophen, ALPRAZolam, dextrose, lip balm, naLOXone (NARCAN)  injection, oxymetazoline, sodium chloride flush, traMADol   Objective:  Vital Signs  Vitals:   06/12/20 1246 06/12/20 2103 06/13/20 0442 06/13/20  0500  BP: (!) 153/77 125/79 123/63   Pulse: 82 74 89   Resp: 18 19 18    Temp: 98.7 F (37.1 C) 97.7 F (36.5 C) 98.4 F (36.9 C)   TempSrc: Oral Oral Oral   SpO2: 99% 100% 98%   Weight:    65.5 kg  Height:        Intake/Output Summary (Last 24 hours) at 06/13/2020 1016 Last data filed at 06/13/2020 0800 Gross per 24 hour  Intake 1485.19 ml  Output 1000 ml  Net 485.19 ml   Filed Weights   06/11/20 0400 06/12/20 0549 06/13/20 0500  Weight: 66 kg 65.7 kg 65.5 kg    General appearance: Awake alert.  In no distress Resp: Clear to auscultation bilaterally.  Normal effort Cardio: S1-S2 is normal regular.  No S3-S4.  No rubs murmurs or bruit GI: Abdomen is soft.  Nontender nondistended.  Bowel sounds are present normal.  No masses organomegaly Extremities: Leg noted to be less erythematous compared to yesterday.   Less swollen as well.  Good peripheral pulses.  Tender.  No fluctuant masses. Neurologic: .  No focal neurological deficits.    Lab Results:  Data Reviewed: I have personally reviewed following labs and imaging studies  CBC: Recent Labs  Lab 06/09/20 0400 06/10/20 0432 06/11/20 0348 06/12/20 0410 06/13/20 0402  WBC 1.9* 6.3 14.9* 14.1* 10.4  NEUTROABS 0.8* 4.7 12.2* 11.8* 8.4*  HGB 10.5* 9.9* 9.3* 9.3* 9.1*  HCT 31.5* 30.9* 28.9* 29.1* 28.1*  MCV 92.1 93.4 94.4 96.0 93.7  PLT 37* 37* 49* 70* 98*    Basic Metabolic Panel: Recent Labs  Lab 06/06/20 1708 06/06/20 1708 06/07/20 0614 06/07/20 0614 06/07/20 2000 06/08/20 0434 06/09/20 0400 06/10/20 0432 06/11/20 0348 06/11/20 1830 06/12/20 0410 06/13/20 0402  NA 133*   < > 134*   < > 134*   < > 135 134* 136  --  136 138  K 2.8*   < > 2.7*   < > 4.3   < > 3.3* 3.9 3.7  --  3.6 3.4*  CL 102   < > 124*   < > 105   < > 109 108 110  --  109 99  CO2 22   < > 19*   < > 19*   < > 20* 18* 20*  --  19* 29  GLUCOSE 114*   < > 81   < > 159*   < > 78 128* 142*  --  223* 99  BUN 23   < > 22   < > 34*   < > 40* 34* 26*  --  23 8  CREATININE 1.09   < > 1.20   < > 1.14   < > 1.01 0.84 0.82  --  0.80 0.88  CALCIUM 7.1*   < > 5.2*   < > 6.9*   < > 7.0* 7.2* 7.4*  --  7.4* 8.1*  MG 2.1  --  1.6*  --  2.5*  --   --  1.9  --   --   --   --   PHOS  --   --  1.5*   < > 3.5  --   --   --  <1.0* 1.9* 1.7* 3.4   < > = values in this interval not displayed.    GFR: Estimated Creatinine Clearance: 66.2 mL/min (by C-G formula based on SCr of 0.88 mg/dL).  Liver Function Tests: Recent Labs  Lab 06/08/20  0762 06/10/20 0432 06/11/20 0348 06/12/20 0410 06/13/20 0402  AST 61* 25 20 16  11*  ALT 102* 64* 47* 27 13  ALKPHOS 46 65 72 80 33*  BILITOT 2.0* 1.4* 1.1 1.1 0.8  PROT 4.1* 4.2* 4.0* 4.1* 5.0*  ALBUMIN 1.9* 1.8* 1.8* 2.0* 2.7*     No results for input(s): AMMONIA in the last 168 hours.  Cardiac Enzymes: Recent Labs  Lab  06/10/20 1128  CKTOTAL 87  CKMB 22.2*    CBG: Recent Labs  Lab 06/12/20 0757 06/12/20 1209 06/12/20 1628 06/12/20 2104 06/13/20 0751  GLUCAP 227* 246* 185* 169* 190*      Recent Results (from the past 240 hour(s))  SARS Coronavirus 2 by RT PCR (hospital order, performed in [facility] hospital lab) Nasopharyngeal Nasopharyngeal Swab     Status: None   Collection Time: 06/05/20  6:23 PM   Specimen: Nasopharyngeal Swab  Result Value Ref Range Status   SARS Coronavirus 2 NEGATIVE NEGATIVE Final    Comment: (NOTE) SARS-CoV-2 target nucleic acids are NOT DETECTED.  The SARS-CoV-2 RNA is generally detectable in upper and lower respiratory specimens during the acute phase of infection. The lowest concentration of SARS-CoV-2 viral copies this assay can detect is 250 copies / mL. A negative result does not preclude SARS-CoV-2 infection and should not be used as the sole basis for treatment or other patient management decisions.  A negative result may occur with improper specimen collection / handling, submission of specimen other than nasopharyngeal swab, presence of viral mutation(s) within the areas targeted by this assay, and inadequate number of viral copies (<250 copies / mL). A negative result must be combined with clinical observations, patient history, and epidemiological information.  Fact Sheet for Patients:   StrictlyIdeas.no  Fact Sheet for Healthcare Providers: BankingDealers.co.za  This test is not yet approved or  cleared by the [place] FDA and has been authorized for detection and/or diagnosis of SARS-CoV-2 by FDA under an Emergency Use Authorization (EUA).  This EUA will remain in effect (meaning this test can be used) for the duration of the COVID-19 declaration under Section 564(b)(1) of the Act, 21 U.S.C. section 360bbb-3(b)(1), unless the authorization is terminated or revoked sooner.  Performed at  [facility], [place] [address], [place], [place] [address]   Culture, blood (Routine X 2) w Reflex to ID Panel     Status: None   Collection Time: 06/06/20 12:42 PM   Specimen: BLOOD RIGHT HAND  Result Value Ref Range Status   Specimen Description   Final    BLOOD RIGHT HAND Performed at [place] [address], [place], [place] [address]    Special Requests   Final    BOTTLES DRAWN AEROBIC ONLY Blood Culture results may not be optimal due to an inadequate volume of blood received in culture bottles Performed at [place] [address] [place], [place] [address]    Culture   Final    NO GROWTH 5 DAYS Performed at [facility], [place] [address] [place],  [address]    Report Status 06/11/2020 FINAL  Final  Culture, blood (Routine X 2) w Reflex to ID Panel     Status: None   Collection Time: 06/06/20 12:42 PM   Specimen: BLOOD LEFT HAND  Result Value Ref Range Status   Specimen Description   Final    BLOOD LEFT HAND Performed at [facility] [address] [place],  [place] [address]    Special Requests   Final    BOTTLES DRAWN AEROBIC ONLY Blood Culture results may not be optimal due to an inadequate volume of blood received in culture bottles Performed at [place] [address] [place], [place] [address]    Culture   Final    NO GROWTH 5 DAYS Performed at [facility], [place] [address] [place], [place] [address]    Report Status 06/11/2020 FINAL  Final  MRSA PCR Screening     Status: None   Collection Time: 06/06/20  5:45 PM   Specimen: Nasal Mucosa; Nasopharyngeal  Result Value Ref Range Status   MRSA by PCR NEGATIVE NEGATIVE Final    Comment:        The GeneXpert MRSA Assay (FDA approved for NASAL specimens only), is one component of a comprehensive MRSA colonization surveillance program. It is not intended to diagnose MRSA infection nor to  guide or monitor treatment for MRSA infections. Performed at [facility], [place] [address], [place], [place] [address]   Culture, Urine     Status: None   Collection Time: 06/06/20  6:38 PM   Specimen: Urine, Catheterized  Result Value Ref Range Status   Specimen Description   Final    URINE, CATHETERIZED Performed at [facility] [address], [place], [place] [address]    Special Requests   Final    NONE Performed at [facility], [place] [address] [place], [place] [address]    Culture   Final    NO GROWTH Performed at [facility], [place] [address] [place], [place] [address]    Report Status 06/08/2020 FINAL  Final  C Difficile Quick Screen w PCR reflex     Status: None   Collection Time: 06/07/20  1:30 AM   Specimen: STOOL  Result Value Ref Range Status   C Diff antigen NEGATIVE NEGATIVE Final   C Diff toxin NEGATIVE NEGATIVE Final   C Diff interpretation No C. difficile detected.  Final    Comment: Performed at [facility], [place] [address] [place],  [address]      Radiology Studies: No results found.     LOS: 8 days   Anieya Helman [employer] on [email or web address]  06/13/2020, 10:16 AM

## 2020-06-13 NOTE — Progress Notes (Signed)
Physical Therapy Treatment Patient Details Name: Tony Marshall MRN: [id number] DOB: 03-19-1941 Today's Date: 06/13/2020    History of Present Illness 79 year old with CLL on Rituxan, [place], diabetes, hypertension, hyperlipidemia admitted with diarrhea, malaise, mild DKA requiring insulin drip. Labs significant for pancytopenia, mild hyperbilirubinemia, electrolyte abnormalities.  Transferred to ICU on 6/22 for hypotension, sepsis with elevated lactic acid.  Pt also with RLE cellulitis and sepsis - onset 6/23 following RLE leg trauma in ER. DVT negative    PT Comments    Pt reports 8/10 pain RLE, can only tolerate toe touch weight bearing on RLE in standing with RW. Min assist of 2 for bed mobility and to pivot to recliner. R knee ext AROM ~ -15* due to pain.  Instructed pt in RLE ROM/strengthening exercises to be done independently.    Follow Up Recommendations  SNF;Supervision/Assistance - 24 hour     Equipment Recommendations  Rolling walker with 5" wheels    Recommendations for Other Services       Precautions / Restrictions Precautions Precautions: Fall Precaution Comments: multiple lines (in ICU) Restrictions Weight Bearing Restrictions: No    Mobility  Bed Mobility Overal bed mobility: Modified Independent Bed Mobility: Supine to Sit     Supine to sit: Min assist     General bed mobility comments: min assist to raise trunk and advance BLEs to EOB  Transfers Overall transfer level: Needs assistance Equipment used: Rolling walker (2 wheeled) Transfers: Sit to/from [facility] Sit to Stand: +2 safety/equipment;Min assist Stand pivot transfers: Min assist;+2 safety/equipment       General transfer comment: VC for hand placement and for sequencing with RW, pt used TTWB on RLE 2* pain  Ambulation/Gait             General Gait Details: unable 2* pain   Stairs             Wheelchair Mobility    Modified Rankin (Stroke  Patients Only)       Balance Overall balance assessment: Needs assistance   Sitting balance-Leahy Scale: Fair     Standing balance support: Bilateral upper extremity supported                                Cognition Arousal/Alertness: Awake/alert Behavior During Therapy: WFL for tasks assessed/performed Overall Cognitive Status: Within Functional Limits for tasks assessed                                        Exercises General Exercises - Lower Extremity Ankle Circles/Pumps: AROM;Both;10 reps;Supine Quad Sets: AROM;Right;5 reps;Supine Long Arc Quad: AROM;Right;5 reps;Seated    General Comments        Pertinent Vitals/Pain Pain Score: 8  Pain Location: R LE with weight bearing Pain Descriptors / Indicators: Aching;Discomfort;Grimacing;Guarding Pain Intervention(s): Limited activity within patient's tolerance;Monitored during session;Premedicated before session;Repositioned    Home Living                      Prior Function            PT Goals (current goals can now be found in the care plan section) Acute Rehab PT Goals Patient Stated Goal: decrease pain PT Goal Formulation: With patient/family Time For Goal Achievement: 06/23/20 Potential to Achieve Goals: Good Progress towards PT goals: Progressing toward goals  Frequency    Min 2X/week      PT Plan Current plan remains appropriate    Co-evaluation              AM-PAC PT "6 Clicks" Mobility   Outcome Measure  Help needed turning from your back to your side while in a flat bed without using bedrails?: A Little Help needed moving from lying on your back to sitting on the side of a flat bed without using bedrails?: A Little Help needed moving to and from a bed to a chair (including a wheelchair)?: A Little Help needed standing up from a chair using your arms (e.g., wheelchair or bedside chair)?: A Lot Help needed to walk in hospital room?: Total Help  needed climbing 3-5 steps with a railing? : Total 6 Click Score: 13    End of Session Equipment Utilized During Treatment: Gait belt Activity Tolerance: Patient tolerated treatment well Patient left: in chair;with call bell/phone within reach;with chair alarm set Nurse Communication: Mobility status PT Visit Diagnosis: Other abnormalities of gait and mobility (R26.89);Pain Pain - Right/Left: Right Pain - part of body: Leg     Time: 6256-3893 PT Time Calculation (min) (ACUTE ONLY): 19 min  Charges:  $Therapeutic Activity: 8-22 mins                     Blondell Reveal Kistler PT 06/13/2020  Acute Rehabilitation Services Pager [phone] Office [phone]

## 2020-06-13 NOTE — Progress Notes (Signed)
Hypoglycemic Event  CBG: 36  Treatment: 8 oz juice/soda  Symptoms: None  Follow-up CBG: Time:1721 CBG Result:87  Possible Reasons for Event: Inadequate meal intake  Comments/MD notified:    Tony Marshall

## 2020-06-13 NOTE — TOC Progression Note (Signed)
Transition of Care [facility]) - Progression Note    Patient Details  Name: Tony Marshall MRN: [id number] Date of Birth: 17-Aug-1941  Transition of Care [facility]) CM/SW Contact  Joaquin Courts, RN Phone Number: 06/13/2020, 11:45 AM  Clinical Narrative:    CM spoke with patient's spouse regarding recommendation for SNF.  Sp[ouse reports at this time she does not feel this will be the best option for patient.  Spouse shares that given his oncology treatments and appointments and the cost of that care, they want to minimize any other expenses.  Spouse feels that they can handle his care at home and wish for him to return home with [facility] services.  Referral given to [place] with [facility] for HHPT/OT.       Expected Discharge Plan: [place] Barriers to Discharge: Continued Medical Work up  Expected Discharge Plan and Services Expected Discharge Plan: [place]   Discharge Planning Services: CM Consult Post Acute Care Choice: [place] arrangements for the past 2 months: Single Family Home                 DME Arranged: N/A DME Agency: NA       HH Arranged: PT, OT HH Agency: [facility] Date [place]: 06/13/20 Time [place]: 1120 Representative spoke with at [place]: [place]   Social Determinants of Health ([place]) Interventions    Readmission Risk Interventions No flowsheet data found.

## 2020-06-14 LAB — GLUCOSE, CAPILLARY
Glucose-Capillary: 195 mg/dL — ABNORMAL HIGH (ref 70–99)
Glucose-Capillary: 247 mg/dL — ABNORMAL HIGH (ref 70–99)
Glucose-Capillary: 144 mg/dL — ABNORMAL HIGH (ref 70–99)

## 2020-06-14 LAB — CBC WITH DIFFERENTIAL/PLATELET
Abs Immature Granulocytes: 0.16 10*3/uL — ABNORMAL HIGH (ref 0.00–0.07)
Basophils Absolute: 0 10*3/uL (ref 0.0–0.1)
Basophils Relative: 0 %
Eosinophils Absolute: 0 10*3/uL (ref 0.0–0.5)
Eosinophils Relative: 0 %
HCT: 27.7 % — ABNORMAL LOW (ref 39.0–52.0)
Hemoglobin: 9.3 g/dL — ABNORMAL LOW (ref 13.0–17.0)
Immature Granulocytes: 2 %
Lymphocytes Relative: 14 %
Lymphs Abs: 1.2 10*3/uL (ref 0.7–4.0)
MCH: 31.3 pg (ref 26.0–34.0)
MCHC: 33.6 g/dL (ref 30.0–36.0)
MCV: 93.3 fL (ref 80.0–100.0)
Monocytes Absolute: 0.6 10*3/uL (ref 0.1–1.0)
Monocytes Relative: 7 %
Neutro Abs: 6.4 10*3/uL (ref 1.7–7.7)
Neutrophils Relative %: 77 %
Platelets: 126 10*3/uL — ABNORMAL LOW (ref 150–400)
RBC: 2.97 MIL/uL — ABNORMAL LOW (ref 4.22–5.81)
RDW: 18.3 % — ABNORMAL HIGH (ref 11.5–15.5)
WBC: 8.3 10*3/uL (ref 4.0–10.5)
nRBC: 0 % (ref 0.0–0.2)

## 2020-06-14 LAB — COMPREHENSIVE METABOLIC PANEL
ALT: 11 U/L (ref 0–44)
AST: 13 U/L — ABNORMAL LOW (ref 15–41)
Albumin: 1.8 g/dL — ABNORMAL LOW (ref 3.5–5.0)
Alkaline Phosphatase: 81 U/L (ref 38–126)
Anion gap: 7 (ref 5–15)
BUN: 15 mg/dL (ref 8–23)
CO2: 23 mmol/L (ref 22–32)
Calcium: 7.4 mg/dL — ABNORMAL LOW (ref 8.9–10.3)
Chloride: 106 mmol/L (ref 98–111)
Creatinine, Ser: 0.57 mg/dL — ABNORMAL LOW (ref 0.61–1.24)
GFR calc Af Amer: >60 mL/min (ref >60)
GFR calc non Af Amer: >60 mL/min (ref >60)
Glucose, Bld: 198 mg/dL — ABNORMAL HIGH (ref 70–99)
Potassium: 3.8 mmol/L (ref 3.5–5.1)
Sodium: 136 mmol/L (ref 135–145)
Total Bilirubin: 0.9 mg/dL (ref 0.3–1.2)
Total Protein: 3.6 g/dL — ABNORMAL LOW (ref 6.5–8.1)

## 2020-06-14 LAB — PHOSPHORUS: Phosphorus: 2.3 mg/dL — ABNORMAL LOW (ref 2.5–4.6)

## 2020-06-14 MED ORDER — POTASSIUM & SODIUM PHOSPHATES 280-160-250 MG PO PACK
1.0000 | PACK | Freq: Three times a day (TID) | ORAL | Status: DC
  Administered 2020-06-14 – 2020-06-20 (×22): 1 via ORAL
  Filled 2020-06-14 (×26): qty 1

## 2020-06-14 MED ORDER — TRAMADOL HCL 50 MG PO TABS
50.0000 mg | ORAL_TABLET | Freq: Four times a day (QID) | ORAL | Status: DC | PRN
  Administered 2020-06-14 – 2020-06-20 (×16): 50 mg via ORAL
  Filled 2020-06-14 (×16): qty 1

## 2020-06-14 MED ORDER — SACUBITRIL-VALSARTAN 24-26 MG PO TABS
1.0000 | ORAL_TABLET | Freq: Two times a day (BID) | ORAL | Status: DC
  Administered 2020-06-14 – 2020-06-20 (×13): 1 via ORAL
  Filled 2020-06-14 (×15): qty 1

## 2020-06-14 MED ORDER — POTASSIUM CHLORIDE CRYS ER 20 MEQ PO TBCR
20.0000 meq | EXTENDED_RELEASE_TABLET | Freq: Two times a day (BID) | ORAL | Status: AC
  Administered 2020-06-14 – 2020-06-16 (×4): 20 meq via ORAL
  Filled 2020-06-14 (×4): qty 1

## 2020-06-14 NOTE — Progress Notes (Signed)
Report received from E. [place], RN. No change from initial pm assessment. Will continue to monitor and follow the POC.  

## 2020-06-14 NOTE — Progress Notes (Signed)
PROGRESS NOTE    Tony Marshall  IRJ:[id number] DOB: 07-Dec-1941 DOA: 06/05/2020 PCP: Elayne Snare, MD    Brief Narrative:  79 year old with CLL on Rituxan, [place], diabetes, hypertension, hyperlipidemia admitted with diarrhea, malaise, mild DKA requiring insulin drip. Labs significant for pancytopenia, mild hyperbilirubinemia, electrolyte abnormalities. Transferred to ICU on 6/22 for hypotension, sepsis with elevated lactic acid. PCCM consulted for help with management.  Had to be placed on pressors.  He stabilized.  He was weaned off of Levophed. Stabilizing.  Right lower extremity cellulitis is slightly improving now.   Assessment & Plan:   Active Problems:   Increased anion gap metabolic acidosis   Altered mental status   Failure to thrive in adult   Symptomatic anemia   Acute respiratory failure (HCC)   Hyperglycemia due to diabetes mellitus (HCC)   Leukopenia   Acute combined systolic and diastolic heart failure (HCC)   Cardiogenic shock (HCC)   Malnutrition of moderate degree  Septic shock in the setting of right lower extremity cellulitis, underlying immunosuppression with CLL: Was on Vanco cefepime on the medical floor, developed septic shock requiring vasopressors. Off vasopressors since 6/25 Currently on Ancef, still has significant right calf swelling and tenderness however clinically improving. Duplex is negative for DVT.  WBC normalizing. Given immunosuppression to status and significant problems, will continue Ancef today, if further improvement may change to oral antibiotics tomorrow for discharge.  May treat with 2 weeks of antibiotics given immunosuppression to status.  Pancytopenia due to CLL and immunosuppression: Initially leukopenic, then developed leukocytosis now improving.  Hemoglobin stable.  He does follow-up with oncology.  Acute metabolic encephalopathy in the setting of sepsis: No neuro deficit.  Gradually improving.  Acute on chronic combined  systolic and diastolic congestive heart failure: Followed by cardiology.  Echocardiogram showed ejection fraction 25 to 30%.  Currently euvolemic. Antihypertensives are on hold. Statin not initiated due to comorbidities. Gradually introduced carvedilol and Entresto and he is tolerating now.  Followed by cardiology.  Diabetes mellitus type 2, uncontrolled with hyperglycemia: A1c 7.7.  On Lantus and SSI.  Fairly stabilized.  Electrolyte imbalance including hypophosphatemia and hypokalemia: Replaced aggressively with improvement.  Physical debility: Continue to work with PT OT.  Recommended SNF.  Family wants to go home with home health and home health aide PT OT.   DVT prophylaxis: enoxaparin (LOVENOX) injection 40 mg Start: 06/13/20 1200   Code Status: Full code Family Communication: Wife at the bedside Disposition Plan: Status is: Inpatient  Remains inpatient appropriate because:IV treatments appropriate due to intensity of illness or inability to take PO   Dispo:  Patient From: Home  Planned Disposition: Home with home health  Expected discharge date: 06/15/20  Medically stable for discharge: No          Consultants:   Cardiology  Procedures:   None  Antimicrobials:  Antibiotics Given (last 72 hours)    Date/Time Action Medication Dose Rate   06/11/20 2203 New Bag/Given   ceFAZolin (ANCEF) IVPB 2g/100 mL premix 2 g 200 mL/hr   06/12/20 0516 New Bag/Given   ceFAZolin (ANCEF) IVPB 2g/100 mL premix 2 g 200 mL/hr   06/12/20 1514 New Bag/Given   ceFAZolin (ANCEF) IVPB 2g/100 mL premix 2 g 200 mL/hr   06/12/20 2108 New Bag/Given   ceFAZolin (ANCEF) IVPB 2g/100 mL premix 2 g 200 mL/hr   06/13/20 0500 New Bag/Given   ceFAZolin (ANCEF) IVPB 2g/100 mL premix 2 g 200 mL/hr   06/13/20 1331 New Bag/Given  ceFAZolin (ANCEF) IVPB 2g/100 mL premix 2 g 200 mL/hr   06/13/20 2100 New Bag/Given   ceFAZolin (ANCEF) IVPB 2g/100 mL premix 2 g 200 mL/hr   06/14/20 0646 New  Bag/Given   ceFAZolin (ANCEF) IVPB 2g/100 mL premix 2 g 200 mL/hr   06/14/20 1312 New Bag/Given   ceFAZolin (ANCEF) IVPB 2g/100 mL premix 2 g 200 mL/hr         Subjective: Patient was seen and examined.  No overnight events.  Wife was at the bedside in the morning rounds.  Remains afebrile.  His appetite is slightly better today and he was happy to be able to eat some food.  Trying to ambulate, it hurts to bear weight on the right leg. Took a picture of his right leg for future comparison and added to epic.  Objective: Vitals:   06/13/20 0500 06/13/20 1456 06/13/20 2033 06/14/20 0647  BP:  134/82 (!) 144/79 (!) 146/90  Pulse:  86 78 74  Resp:  18 20 20   Temp:  98.4 F (36.9 C) 98.2 F (36.8 C) 98.2 F (36.8 C)  TempSrc:  Oral Oral Oral  SpO2:  97% 100% 98%  Weight: 65.5 kg   64.6 kg  Height:        Intake/Output Summary (Last 24 hours) at 06/14/2020 1542 Last data filed at 06/14/2020 0945 Gross per 24 hour  Intake 420 ml  Output 850 ml  Net -430 ml   Filed Weights   06/12/20 0549 06/13/20 0500 06/14/20 0647  Weight: 65.7 kg 65.5 kg 64.6 kg    Examination:  General exam: Appears calm and comfortable  Respiratory system: Clear to auscultation. Respiratory effort normal. Cardiovascular system: S1 & S2 heard, RRR. Gastrointestinal system: Abdomen is nondistended, soft and nontender. No organomegaly or masses felt. Normal bowel sounds heard. Central nervous system: Alert and oriented. No focal neurological deficits. Extremities: Symmetric 5 x 5 power.         Data Reviewed: I have personally reviewed following labs and imaging studies  CBC: Recent Labs  Lab 06/10/20 0432 06/10/20 1129 06/11/20 0348 06/12/20 0410 06/13/20 0402 06/14/20 0307  WBC 6.3  --  14.9* 14.1* 10.4 8.3  NEUTROABS 4.7  --  12.2* 11.8* 8.4* 6.4  HGB 9.9* 10.8* 9.3* 9.3* 9.1* 9.3*  HCT 30.9*  --  28.9* 29.1* 28.1* 27.7*  MCV 93.4  --  94.4 96.0 93.7 93.3  PLT 37*  --  49* 70* 98*  224*   Basic Metabolic Panel: Recent Labs  Lab 06/07/20 2000 06/07/20 2000 06/08/20 0434 06/10/20 0432 06/11/20 0348 06/11/20 1830 06/12/20 0410 06/13/20 0402 06/14/20 0307  NA 134*  --    < > 134* 136  --  136 138 136  K 4.3  --    < > 3.9 3.7  --  3.6 3.4* 3.8  CL 105  --    < > 108 110  --  109 99 106  CO2 19*  --    < > 18* 20*  --  19* 29 23  GLUCOSE 159*  --    < > 128* 142*  --  223* 99 198*  BUN 34*  --    < > 34* 26*  --  23 8 15   CREATININE 1.14  --    < > 0.84 0.82  --  0.80 0.88 0.57*  CALCIUM 6.9*  --    < > 7.2* 7.4*  --  7.4* 8.1* 7.4*  MG 2.5*  --   --  1.9  --   --   --   --   --   PHOS 3.5   < >  --   --  <1.0* 1.9* 1.7* 3.4 2.3*   < > = values in this interval not displayed.   GFR: Estimated Creatinine Clearance: 71.8 mL/min (A) (by C-G formula based on SCr of 0.57 mg/dL (L)). Liver Function Tests: Recent Labs  Lab 06/10/20 0432 06/11/20 0348 06/12/20 0410 06/13/20 0402 06/14/20 0307  AST 25 20 16  11* 13*  ALT 64* 47* 27 13 11   ALKPHOS 65 72 80 33* 81  BILITOT 1.4* 1.1 1.1 0.8 0.9  PROT 4.2* 4.0* 4.1* 5.0* 3.6*  ALBUMIN 1.8* 1.8* 2.0* 2.7* 1.8*   No results for input(s): LIPASE, AMYLASE in the last 168 hours. No results for input(s): AMMONIA in the last 168 hours. Coagulation Profile: No results for input(s): INR, PROTIME in the last 168 hours. Cardiac Enzymes: Recent Labs  Lab 06/10/20 1128  CKTOTAL 87  CKMB 22.2*   BNP (last 3 results) No results for input(s): PROBNP in the last 8760 hours. HbA1C: No results for input(s): HGBA1C in the last 72 hours. CBG: Recent Labs  Lab 06/13/20 1628 06/13/20 1721 06/13/20 2036 06/14/20 0756 06/14/20 1152  GLUCAP 36* 87 174* 195* 247*   Lipid Profile: No results for input(s): CHOL, HDL, LDLCALC, TRIG, CHOLHDL, LDLDIRECT in the last 72 hours. Thyroid Function Tests: No results for input(s): TSH, T4TOTAL, FREET4, T3FREE, THYROIDAB in the last 72 hours. Anemia Panel: No results for input(s):  VITAMINB12, FOLATE, FERRITIN, TIBC, IRON, RETICCTPCT in the last 72 hours. Sepsis Labs: Recent Labs  Lab 06/10/20 1128 06/11/20 0348  LATICACIDVEN 1.2 1.1    Recent Results (from the past 240 hour(s))  SARS Coronavirus 2 by RT PCR (hospital order, performed in [facility] hospital lab) Nasopharyngeal Nasopharyngeal Swab     Status: None   Collection Time: 06/05/20  6:23 PM   Specimen: Nasopharyngeal Swab  Result Value Ref Range Status   SARS Coronavirus 2 NEGATIVE NEGATIVE Final    Comment: (NOTE) SARS-CoV-2 target nucleic acids are NOT DETECTED.  The SARS-CoV-2 RNA is generally detectable in upper and lower respiratory specimens during the acute phase of infection. The lowest concentration of SARS-CoV-2 viral copies this assay can detect is 250 copies / mL. A negative result does not preclude SARS-CoV-2 infection and should not be used as the sole basis for treatment or other patient management decisions.  A negative result may occur with improper specimen collection / handling, submission of specimen other than nasopharyngeal swab, presence of viral mutation(s) within the areas targeted by this assay, and inadequate number of viral copies (<250 copies / mL). A negative result must be combined with clinical observations, patient history, and epidemiological information.  Fact Sheet for Patients:   StrictlyIdeas.no  Fact Sheet for Healthcare Providers: BankingDealers.co.za  This test is not yet approved or  cleared by the [place] FDA and has been authorized for detection and/or diagnosis of SARS-CoV-2 by FDA under an Emergency Use Authorization (EUA).  This EUA will remain in effect (meaning this test can be used) for the duration of the COVID-19 declaration under Section 564(b)(1) of the Act, 21 U.S.C. section 360bbb-3(b)(1), unless the authorization is terminated or revoked sooner.  Performed at [facility], [place] [address] [place], [place] [address]   Culture, blood (Routine X 2) w Reflex to ID Panel     Status: None   Collection Time: 06/06/20  12:42 PM   Specimen: BLOOD RIGHT HAND  Result Value Ref Range Status   Specimen Description   Final    BLOOD RIGHT HAND Performed at [facility] [address] [place], [place] [address]    Special Requests   Final    BOTTLES DRAWN AEROBIC ONLY Blood Culture results may not be optimal due to an inadequate volume of blood received in culture bottles Performed at [facility] [address] [place], [place] [address]    Culture   Final    NO GROWTH 5 DAYS Performed at [facility], [place] [address] [place], [place] [address]    Report Status 06/11/2020 FINAL  Final  Culture, blood (Routine X 2) w Reflex to ID Panel     Status: None   Collection Time: 06/06/20 12:42 PM   Specimen: BLOOD LEFT HAND  Result Value Ref Range Status   Specimen Description   Final    BLOOD LEFT HAND Performed at [facility] [address] [place], [place] [address]    Special Requests   Final    BOTTLES DRAWN AEROBIC ONLY Blood Culture results may not be optimal due to an inadequate volume of blood received in culture bottles Performed at [facility] [address], [place], [place] [address]    Culture   Final    NO GROWTH 5 DAYS Performed at [facility], [place] [address], [place], [place] [address]    Report Status 06/11/2020 FINAL  Final  MRSA PCR Screening     Status: None   Collection Time: 06/06/20  5:45 PM   Specimen: Nasal Mucosa; Nasopharyngeal  Result Value Ref Range Status   MRSA by PCR NEGATIVE NEGATIVE Final    Comment:        The GeneXpert MRSA Assay (FDA approved for NASAL specimens only), is one component of a comprehensive MRSA colonization surveillance program. It is not intended to diagnose MRSA infection nor to guide or monitor  treatment for MRSA infections. Performed at [facility], [place] [address], [place], [place] [address]   Culture, Urine     Status: None   Collection Time: 06/06/20  6:38 PM   Specimen: Urine, Catheterized  Result Value Ref Range Status   Specimen Description   Final    URINE, CATHETERIZED Performed at [facility] [address], [place], [place] [address]    Special Requests   Final    NONE Performed at [facility], [place] [address], [place], [place] [address]    Culture   Final    NO GROWTH Performed at [facility], [place] [address] [place], [place] [address]    Report Status 06/08/2020 FINAL  Final  C Difficile Quick Screen w PCR reflex     Status: None   Collection Time: 06/07/20  1:30 AM   Specimen: STOOL  Result Value Ref Range Status   C Diff antigen NEGATIVE NEGATIVE Final   C Diff toxin NEGATIVE NEGATIVE Final   C Diff interpretation No C. difficile detected.  Final    Comment: Performed at [facility], [place] [address] [place], [place] [address]         Radiology Studies: No results found.      Scheduled Meds: . acetaminophen  650 mg Oral Once  . carvedilol  3.125 mg Oral BID WC  . Chlorhexidine Gluconate Cloth  6 each Topical  Daily  . enoxaparin (LOVENOX) injection  40 mg Subcutaneous Q24H  . feeding supplement (ENSURE ENLIVE)  237 mL Oral BID BM  . feeding supplement (KATE FARMS STANDARD 1.4)  325 mL Oral BID BM  . folic acid  1 mg Oral Daily  . insulin aspart  0-9 Units Subcutaneous TID WC  . insulin glargine  5 Units Subcutaneous Daily  . multivitamin with minerals  1 tablet Oral Daily  . pantoprazole  40 mg Oral Daily  . protein supplement  12 g Oral TID WC  . rosuvastatin  5 mg Oral QHS  . sacubitril-valsartan  1 tablet Oral BID  . senna  1 tablet Oral Daily  . sodium chloride flush  10-40 mL Intracatheter Q12H  . thiamine  100 mg Oral Daily   Continuous  Infusions: . sodium chloride    .  ceFAZolin (ANCEF) IV 2 g (06/14/20 1312)     LOS: 9 days    Time spent: 30 minutes    Barb Merino, MD [facility] Pager [phone]

## 2020-06-14 NOTE — Progress Notes (Signed)
Progress Note  Patient Name: Tony Marshall Date of Encounter: 06/14/2020  [facility] Cardiologist: Skeet Latch, MD   Subjective   Still having leg pain, especially when he tries to bear weight, but notes that leg is less red and swollen. Tolerating medications well. No chest pain or shortness of breath.  Inpatient Medications    Scheduled Meds: . acetaminophen  650 mg Oral Once  . carvedilol  3.125 mg Oral BID WC  . Chlorhexidine Gluconate Cloth  6 each Topical Daily  . enoxaparin (LOVENOX) injection  40 mg Subcutaneous Q24H  . feeding supplement (ENSURE ENLIVE)  237 mL Oral BID BM  . feeding supplement (KATE FARMS STANDARD 1.4)  325 mL Oral BID BM  . folic acid  1 mg Oral Daily  . insulin aspart  0-9 Units Subcutaneous TID WC  . insulin glargine  5 Units Subcutaneous Daily  . multivitamin with minerals  1 tablet Oral Daily  . pantoprazole  40 mg Oral Daily  . protein supplement  12 g Oral TID WC  . rosuvastatin  5 mg Oral QHS  . sacubitril-valsartan  1 tablet Oral BID  . senna  1 tablet Oral Daily  . sodium chloride flush  10-40 mL Intracatheter Q12H  . thiamine  100 mg Oral Daily   Continuous Infusions: . sodium chloride    .  ceFAZolin (ANCEF) IV 2 g (06/14/20 1312)   PRN Meds: acetaminophen **OR** [DISCONTINUED] acetaminophen, ALPRAZolam, dextrose, lip balm, naLOXone (NARCAN)  injection, oxymetazoline, sodium chloride flush, traMADol   Vital Signs    Vitals:   06/13/20 0500 06/13/20 1456 06/13/20 2033 06/14/20 0647  BP:  134/82 (!) 144/79 (!) 146/90  Pulse:  86 78 74  Resp:  18 20 20   Temp:  98.4 F (36.9 C) 98.2 F (36.8 C) 98.2 F (36.8 C)  TempSrc:  Oral Oral Oral  SpO2:  97% 100% 98%  Weight: 65.5 kg   64.6 kg  Height:        Intake/Output Summary (Last 24 hours) at 06/14/2020 1327 Last data filed at 06/14/2020 0945 Gross per 24 hour  Intake 640 ml  Output 850 ml  Net -210 ml   Last 3 Weights 06/14/2020 06/13/2020 06/12/2020  Weight  (lbs) 142 lb 6.7 oz 144 lb 6.4 oz 144 lb 14.4 oz  Weight (kg) 64.6 kg 65.5 kg 65.726 kg      Telemetry    NSR - Personally Reviewed  ECG    06/05/20  NSR with TWI - Personally Reviewed  Physical Exam   GEN: Frail but in no acute distress HEENT: Normal, moist mucous membranes NECK: No JVD CARDIAC: regular rhythm, normal S1 and S2, no rubs or gallops. No murmur. VASCULAR: Radial and DP pulses 2+ bilaterally. No carotid bruits RESPIRATORY:  Clear to auscultation without rales, wheezing or rhonchi  ABDOMEN: Soft, non-tender, non-distended MUSCULOSKELETAL:  Moves all 4 limbs independently SKIN: RLE with improved erythema and swelling. NEUROLOGIC:  No focal neuro deficits noted. PSYCHIATRIC:  Normal affect   Labs    High Sensitivity Troponin:   Recent Labs  Lab 05/16/20 1303 05/16/20 1640 06/05/20 1710  TROPONINIHS 9 8 11       Chemistry Recent Labs  Lab 06/12/20 0410 06/13/20 0402 06/14/20 0307  NA 136 138 136  K 3.6 3.4* 3.8  CL 109 99 106  CO2 19* 29 23  GLUCOSE 223* 99 198*  BUN 23 8 15   CREATININE 0.80 0.88 0.57*  CALCIUM 7.4* 8.1* 7.4*  PROT 4.1* 5.0* 3.6*  ALBUMIN 2.0* 2.7* 1.8*  AST 16 11* 13*  ALT 27 13 11   ALKPHOS 80 33* 81  BILITOT 1.1 0.8 0.9  GFRNONAA >60 >60 >60  GFRAA >60 >60 >60  ANIONGAP 8 10 7      Hematology Recent Labs  Lab 06/12/20 0410 06/13/20 0402 06/14/20 0307  WBC 14.1* 10.4 8.3  RBC 3.03* 3.00* 2.97*  HGB 9.3* 9.1* 9.3*  HCT 29.1* 28.1* 27.7*  MCV 96.0 93.7 93.3  MCH 30.7 30.3 31.3  MCHC 32.0 32.4 33.6  RDW 19.3* 18.6* 18.3*  PLT 70* 98* 126*    BNPNo results for input(s): BNP, PROBNP in the last 168 hours.   DDimer No results for input(s): DDIMER in the last 168 hours.   Radiology    No results found.  Cardiac Studies   Echo 06/07/20 1. Left ventricular ejection fraction, by estimation, is 25 to 30%. The  left ventricle has severely decreased function. The left ventricle has no  regional wall motion  abnormalities. Left ventricular diastolic parameters  are consistent with Grade II  diastolic dysfunction (pseudonormalization).  2. Right ventricular systolic function is normal. The right ventricular  size is normal. There is normal pulmonary artery systolic pressure.  3. The mitral valve is normal in structure. No evidence of mitral valve  regurgitation. No evidence of mitral stenosis.  4. The aortic valve is grossly normal. Aortic valve regurgitation is not  visualized. No aortic stenosis is present.  5. The inferior vena cava is normal in size with greater than 50%  respiratory variability, suggesting right atrial pressure of 3 mmHg.   Patient Profile     79 y.o. male with PMH hypertension, hyperlipidemia, type II diabetes, CLL on rituxin and bendeka. He was admitted with mild DKA, malaise, diarrhea, RLE cellulitis complicated by septic shock requiring ICU and pressors this admission. Cardiology consulted for new cardiomyopathy  Assessment & Plan    Cardiomyopathy, unknown etiology, new onset: -euvolemic. Initially required IV fluids in the setting of septic shock, has not required diuresis this admission -titrating goal directed medical therapy. Tolerated ARB, starting low dose entresto today. Started back low dose carvedilol 6/29. Will make slow changes, thus far tolerating well. -right and left heart cath as an outpatient, have discussed with family. -holding aspirin 81 mg daily with anemia/thrombocytopenia -restarted rosuvastatin -consider SGLT2i as an outpatient given diabetes and cardiomyopathy  Hypertension history, with hypotension/shock earlier this admission: -Had hypertension as an outpatient. Was on benazepril 20 mg daily and carvedilol 25 mg BID, in addition to amlodipine 5 mg daily and doxazosin 2 mg daily. -as above, tolerated ARB, now starting entresto. Also added back carvedilol low dose. Would aim to optimize these prior to restarting amlodipine or doxazosin. No  benazepril now that he is on entresto.  Type II diabetes: -stopped pioglitazone given low EF -would consider SGLT2i given reduced EF and diabetes  CLL, on rituxin and bendeka -per primary team and oncology  RLE cellulitis with septic shock earlier in admission: -per primary team  For questions or updates, please contact [facility] Please consult [email or web address] for contact info under   Signed, Buford Dresser, MD  06/14/2020, 1:27 PM

## 2020-06-15 LAB — CBC WITH DIFFERENTIAL/PLATELET
Abs Immature Granulocytes: 0.08 10*3/uL — ABNORMAL HIGH (ref 0.00–0.07)
Basophils Absolute: 0 10*3/uL (ref 0.0–0.1)
Basophils Relative: 0 %
Eosinophils Absolute: 0 10*3/uL (ref 0.0–0.5)
Eosinophils Relative: 0 %
HCT: 28.6 % — ABNORMAL LOW (ref 39.0–52.0)
Hemoglobin: 9.3 g/dL — ABNORMAL LOW (ref 13.0–17.0)
Immature Granulocytes: 1 %
Lymphocytes Relative: 12 %
Lymphs Abs: 0.9 10*3/uL (ref 0.7–4.0)
MCH: 30.8 pg (ref 26.0–34.0)
MCHC: 32.5 g/dL (ref 30.0–36.0)
MCV: 94.7 fL (ref 80.0–100.0)
Monocytes Absolute: 0.5 10*3/uL (ref 0.1–1.0)
Monocytes Relative: 7 %
Neutro Abs: 6 10*3/uL (ref 1.7–7.7)
Neutrophils Relative %: 80 %
Platelets: 126 10*3/uL — ABNORMAL LOW (ref 150–400)
RBC: 3.02 MIL/uL — ABNORMAL LOW (ref 4.22–5.81)
RDW: 17.9 % — ABNORMAL HIGH (ref 11.5–15.5)
WBC: 7.5 10*3/uL (ref 4.0–10.5)
nRBC: 0 % (ref 0.0–0.2)

## 2020-06-15 LAB — COMPREHENSIVE METABOLIC PANEL
ALT: 8 U/L (ref 0–44)
AST: 12 U/L — ABNORMAL LOW (ref 15–41)
Albumin: 2 g/dL — ABNORMAL LOW (ref 3.5–5.0)
Alkaline Phosphatase: 79 U/L (ref 38–126)
Anion gap: 10 (ref 5–15)
BUN: 13 mg/dL (ref 8–23)
CO2: 22 mmol/L (ref 22–32)
Calcium: 7.4 mg/dL — ABNORMAL LOW (ref 8.9–10.3)
Chloride: 102 mmol/L (ref 98–111)
Creatinine, Ser: 0.52 mg/dL — ABNORMAL LOW (ref 0.61–1.24)
GFR calc Af Amer: >60 mL/min (ref >60)
GFR calc non Af Amer: >60 mL/min (ref >60)
Glucose, Bld: 199 mg/dL — ABNORMAL HIGH (ref 70–99)
Potassium: 3.3 mmol/L — ABNORMAL LOW (ref 3.5–5.1)
Sodium: 134 mmol/L — ABNORMAL LOW (ref 135–145)
Total Bilirubin: 1.1 mg/dL (ref 0.3–1.2)
Total Protein: 4.2 g/dL — ABNORMAL LOW (ref 6.5–8.1)

## 2020-06-15 LAB — GLUCOSE, CAPILLARY
Glucose-Capillary: 175 mg/dL — ABNORMAL HIGH (ref 70–99)
Glucose-Capillary: 208 mg/dL — ABNORMAL HIGH (ref 70–99)
Glucose-Capillary: 212 mg/dL — ABNORMAL HIGH (ref 70–99)
Glucose-Capillary: 115 mg/dL — ABNORMAL HIGH (ref 70–99)
Glucose-Capillary: 168 mg/dL — ABNORMAL HIGH (ref 70–99)

## 2020-06-15 MED ORDER — POLYETHYLENE GLYCOL 3350 17 G PO PACK
17.0000 g | PACK | Freq: Once | ORAL | Status: AC
  Administered 2020-06-15: 17 g via ORAL
  Filled 2020-06-15: qty 1

## 2020-06-15 MED ORDER — LIVING BETTER WITH HEART FAILURE BOOK: Freq: Once | Status: AC

## 2020-06-15 MED ORDER — CARVEDILOL 12.5 MG PO TABS
12.5000 mg | ORAL_TABLET | Freq: Two times a day (BID) | ORAL | Status: DC
  Administered 2020-06-15 – 2020-06-20 (×11): 12.5 mg via ORAL
  Filled 2020-06-15 (×11): qty 1

## 2020-06-15 MED ORDER — MELATONIN 3 MG PO TABS
6.0000 mg | ORAL_TABLET | Freq: Every evening | ORAL | Status: DC | PRN
  Administered 2020-06-15 – 2020-06-17 (×3): 6 mg via ORAL
  Filled 2020-06-15 (×3): qty 2

## 2020-06-15 MED ORDER — TRAZODONE HCL 50 MG PO TABS
50.0000 mg | ORAL_TABLET | Freq: Once | ORAL | Status: AC
  Administered 2020-06-15: 50 mg via ORAL
  Filled 2020-06-15: qty 1

## 2020-06-15 NOTE — Progress Notes (Addendum)
Progress Note  Patient Name: Tony Marshall Date of Encounter: 06/15/2020  Primary Cardiologist: Skeet Latch, MD (remotely Dr. Angelena Form in 2015 but only PRN f/u at that time)  Subjective   No cardiac complaints this AM. No CP, SOB or edema. Still not sleeping well, which is major complaint. Wife notes increased urination overnight.  Inpatient Medications    Scheduled Meds: . acetaminophen  650 mg Oral Once  . carvedilol  3.125 mg Oral BID WC  . Chlorhexidine Gluconate Cloth  6 each Topical Daily  . enoxaparin (LOVENOX) injection  40 mg Subcutaneous Q24H  . feeding supplement (ENSURE ENLIVE)  237 mL Oral BID BM  . feeding supplement (KATE FARMS STANDARD 1.4)  325 mL Oral BID BM  . folic acid  1 mg Oral Daily  . insulin aspart  0-9 Units Subcutaneous TID WC  . insulin glargine  5 Units Subcutaneous Daily  . multivitamin with minerals  1 tablet Oral Daily  . pantoprazole  40 mg Oral Daily  . potassium & sodium phosphates  1 packet Oral TID WC & HS  . potassium chloride  20 mEq Oral BID  . protein supplement  12 g Oral TID WC  . rosuvastatin  5 mg Oral QHS  . sacubitril-valsartan  1 tablet Oral BID  . senna  1 tablet Oral Daily  . sodium chloride flush  10-40 mL Intracatheter Q12H  . thiamine  100 mg Oral Daily   Continuous Infusions: . sodium chloride    .  ceFAZolin (ANCEF) IV 2 g (06/15/20 0624)   PRN Meds: acetaminophen **OR** [DISCONTINUED] acetaminophen, ALPRAZolam, dextrose, lip balm, melatonin, naLOXone (NARCAN)  injection, oxymetazoline, sodium chloride flush, traMADol   Vital Signs    Vitals:   06/14/20 1810 06/14/20 2147 06/15/20 0500 06/15/20 0748  BP: (!) 147/85 139/77 132/73 (!) 149/76  Pulse: 76 75 88 87  Resp:  14 16   Temp:  98.4 F (36.9 C) 98.5 F (36.9 C)   TempSrc:  Oral Oral   SpO2:  97% 97% 97%  Weight:   63.6 kg   Height:        Intake/Output Summary (Last 24 hours) at 06/15/2020 0842 Last data filed at 06/15/2020 0807 Gross per 24  hour  Intake 516 ml  Output 1600 ml  Net -1084 ml   Last 3 Weights 06/15/2020 06/14/2020 06/13/2020  Weight (lbs) 140 lb 3.4 oz 142 lb 6.7 oz 144 lb 6.4 oz  Weight (kg) 63.6 kg 64.6 kg 65.5 kg     Telemetry    NSR, one blocked PAC - Personally Reviewed  Physical Exam   GEN: Somewhat frail appearing but no acute distress, lying 15 degrees in bed without dyspnea HEENT: Normocephalic, atraumatic, sclera non-icteric. Neck: No JVD or bruits. Cardiac: RRR no murmurs, rubs, or gallops.  Radials/DP/PT 1+ and equal bilaterally.  Respiratory: Clear to auscultation bilaterally. Breathing is unlabored. GI: Soft, nontender, non-distended, BS +x 4. MS: no deformity. Extremities: No clubbing or cyanosis. RLE with improving appearing erythema Neuro:  AAOx3. Follows commands. Psych:  Responds to questions appropriately with a normal affect.  Labs    High Sensitivity Troponin:   Recent Labs  Lab 05/16/20 1303 05/16/20 1640 06/05/20 1710  TROPONINIHS 9 8 11       Cardiac EnzymesNo results for input(s): TROPONINI in the last 168 hours. No results for input(s): TROPIPOC in the last 168 hours.   Chemistry Recent Labs  Lab 06/13/20 0402 06/14/20 0307 06/15/20 0440  NA 138  136 134*  K 3.4* 3.8 3.3*  CL 99 106 102  CO2 29 23 22   GLUCOSE 99 198* 199*  BUN 8 15 13   CREATININE 0.88 0.57* 0.52*  CALCIUM 8.1* 7.4* 7.4*  PROT 5.0* 3.6* 4.2*  ALBUMIN 2.7* 1.8* 2.0*  AST 11* 13* 12*  ALT 13 11 8   ALKPHOS 33* 81 79  BILITOT 0.8 0.9 1.1  GFRNONAA >60 >60 >60  GFRAA >60 >60 >60  ANIONGAP 10 7 10      Hematology Recent Labs  Lab 06/13/20 0402 06/14/20 0307 06/15/20 0440  WBC 10.4 8.3 7.5  RBC 3.00* 2.97* 3.02*  HGB 9.1* 9.3* 9.3*  HCT 28.1* 27.7* 28.6*  MCV 93.7 93.3 94.7  MCH 30.3 31.3 30.8  MCHC 32.4 33.6 32.5  RDW 18.6* 18.3* 17.9*  PLT 98* 126* 126*    BNPNo results for input(s): BNP, PROBNP in the last 168 hours.   DDimer No results for input(s): DDIMER in the last 168  hours.   Radiology    No results found.  Cardiac Studies   Echo 06/07/20 1. Left ventricular ejection fraction, by estimation, is 25 to 30%. The  left ventricle has severely decreased function. The left ventricle has no  regional wall motion abnormalities. Left ventricular diastolic parameters  are consistent with Grade II  diastolic dysfunction (pseudonormalization).  2. Right ventricular systolic function is normal. The right ventricular  size is normal. There is normal pulmonary artery systolic pressure.  3. The mitral valve is normal in structure. No evidence of mitral valve  regurgitation. No evidence of mitral stenosis.  4. The aortic valve is grossly normal. Aortic valve regurgitation is not  visualized. No aortic stenosis is present.  5. The inferior vena cava is normal in size with greater than 50%  respiratory variability, suggesting right atrial pressure of 3 mmHg.  Patient Profile     79 y.o. male with history of CLL, DM, OSA, HTN, HLD, prior habitual alcohol use, GERD, Schatzki's ring. Tumultuous course recently related to CLL, initiation of chemotherapy, dehydration, weight loss, constipation and subsequent diarrhea after laxatives. He was admitted 06/05/2020 with confusion, weakness, and fatigue, felt to have septic shock in setting of RLL cellulitis. He was noted to be hyponatremic, hypokalemic, hypomagnesemic, febrile, in metabolic acidosis with anion gap and lactic acidosis, with AKI, decreased TSH, toxic metabolic encephalopathy, and chemo-induced neutropenia. Co-ox was relatively normal.  2D echo was obtained which showed severe LV dysfunction with EF 25-30%, grade 2 DD, normal RV, therefore cardiology consulted. Coronary atherosclerosis mentioned on CT.  Assessment & Plan    1. Septic shock in context of RLL cellulitis, mild DKA on admission, required aggressive fluid resuscitation. LE duplex was negative for DVT. - Per primary team  2. Cardiomyopathy, etiology  unknown, newly recognized - hsTroponin was low this admission. Differential for LV dysfunction includes stress cardiomyopathy, underlying occult coronary disease, previous regular ETOH consumption or chemotherapy. Admit EKG showed new TWI across anterior precordial leads. He had not had any recent angina. - plan to pursue [facility] as outpatient - holding aspirin with anemia/thrombocytopenia (stability will need to be determined before cath) - rosuvastatin restarted; LDL 61 - slow initiation of GDMT for CHF given hypotension earlier this admission - tolerating Entresto/carvedilol dosing - consider titration of beta blocker today - will rx living better with CHF booklet for education - consider SGLT2i given reduced EF and diabetes  3. Metabolic encephalopathy - appears to be improving. Upon awakening, patient is alert and oriented x3 and  recognizes wife, able to contribute to history. Complains of difficulty sleeping.  4. Prolonged QTc on admission - likely in context of metabolic derangement, improved.  5. Suppressed TSH <0.200 - free T4 normal, total T3 slightly low so may represent relationship to acute illness. - Per primary team  6. CLL - on Rituxin and Bendeka. - Per IM/oncology  7. Hypokalemia - primary team repleting  Preliminarily requested f/u from office - first available 7/19 with APP. Will put appt on AVS. Should have primary care/oncology f/u within 1 week of discharge as well for primary issues.  For questions or updates, please contact [place] Please consult [email or web address] for contact info under Cardiology/STEMI.  Signed, Charlie Pitter, PA-C 06/15/2020, 8:42 AM

## 2020-06-15 NOTE — Progress Notes (Signed)
PROGRESS NOTE    Tony Marshall  ZOX:[id number] DOB: 12-15-1941 DOA: 06/05/2020 PCP: Elayne Snare, MD   Chef Complaints: diarrhea/malaise  Brief Narrative: 79 year old with CLL on Rituxan, Bendeka, diabetes, hypertension, hyperlipidemia admitted with diarrhea, malaise, mild DKA requiring insulin drip. Labs significant for pancytopenia, mild hyperbilirubinemia, electrolyte abnormalities. Transferred to ICU on 6/22 for hypotension, sepsis with elevated lactic acid. PCCM consulted for help with management. Had to be placed on pressors. He stabilized. He was weaned off of Levophed 6/25. He was stabilized and transferred to [facility] service.  Subjective: no new complaints did not sleep well was peeing " so much" last night, was restless tried xanax and melatonin last night wife at bedside no CP/SOB/Fever Peeing okay and only tiny BM -wanting laxatives. Poor po intake and does not feel ready today for home " let's try tomorrow"   Assessment & Plan:  Septic shock due to right lower extremity cellulitis in the setting of immunosuppression with CLL: Initially on vasopressors off since 6/25, also was on vancomycin and cefepime.  Antibiotic has been deescalated to Ancef.  Right lower extremity cellulitis area of redness swelling is nicely improving.  We will plan for 2 weeks of antibiotics total and keep on IV Ancef for overnight.  Pancytopenia in the setting of CLL/CLL with immunosuppression: Initially leukopenic and her leukocytosis overall stable.Followed by oncology. Recent Labs  Lab 06/13/20 0402 06/14/20 0307 06/15/20 0440  HGB 9.1* 9.3* 9.3*  HCT 28.1* 27.7* 28.6*  WBC 10.4 8.3 7.5  PLT 98* 126* 811*   Acute metabolic encephalopathy due to sepsis.  Mental status is resolved.  Is a stable alert awake and oriented.  Acute on chronic combined systolic and diastolic congestive heart failure.  Echo with EF 25 to 30% currently euvolemic followed by cardiology uptitrating patient's  beta-blocker continue Entresto continue to optimize medication per cardiology.  T2DM uncontrolled with A1c 7.7, continue Lantus sliding scale. Recent Labs  Lab 06/14/20 1152 06/14/20 2144 06/15/20 0243 06/15/20 0734 06/15/20 1100  GLUCAP 247* 144* 175* 208* 212*   Hypokalemia/hypophosphatemia continue to monitor and replete. Ka t 3.3   Generalized weakness/debility: Patient appears weak frail.  PT OT suggested skilled nursing facility but refused by patient and family instead opted for home with home health. Patient and patient's wife do not feel they are ready for home yet and reports they will be ready tomorrow  DVT prophylaxis: enoxaparin (LOVENOX) injection 40 mg Start: 06/13/20 1200 Code Status:FUll Family Communication: plan of care discussed with patient at bedside.  Status is: Inpatient  Remains inpatient appropriate because:Ongoing diagnostic testing needed not appropriate for outpatient work up, Unsafe d/c plan and Inpatient level of care appropriate due to severity of illness   Dispo:  Patient From: Home  Planned Disposition: [place] refused.  Expected discharge date: 1 day  Medically stable for discharge: No  Nutrition: Diet Order            Diet heart healthy/carb modified Room service appropriate? No; Fluid consistency: Thin  Diet effective now                 Nutrition Problem: Moderate Malnutrition Etiology: chronic illness, cancer and cancer related treatments Signs/Symptoms: mild fat depletion, moderate fat depletion, moderate muscle depletion Interventions: MVI, Other (Comment) (Beneprotein and [employer]) Body mass index is 21.01 kg/m.  Consultants:see note  Procedures:see note Microbiology:see note  Medications: Scheduled Meds: . acetaminophen  650 mg Oral Once  . carvedilol  3.125 mg Oral BID  WC  . Chlorhexidine Gluconate Cloth  6 each Topical Daily  . enoxaparin (LOVENOX) injection  40 mg Subcutaneous Q24H  . feeding  supplement (ENSURE ENLIVE)  237 mL Oral BID BM  . feeding supplement (KATE FARMS STANDARD 1.4)  325 mL Oral BID BM  . folic acid  1 mg Oral Daily  . insulin aspart  0-9 Units Subcutaneous TID WC  . insulin glargine  5 Units Subcutaneous Daily  . multivitamin with minerals  1 tablet Oral Daily  . pantoprazole  40 mg Oral Daily  . potassium & sodium phosphates  1 packet Oral TID WC & HS  . potassium chloride  20 mEq Oral BID  . protein supplement  12 g Oral TID WC  . rosuvastatin  5 mg Oral QHS  . sacubitril-valsartan  1 tablet Oral BID  . senna  1 tablet Oral Daily  . sodium chloride flush  10-40 mL Intracatheter Q12H  . thiamine  100 mg Oral Daily   Continuous Infusions: . sodium chloride    .  ceFAZolin (ANCEF) IV 2 g (06/15/20 5852)    Antimicrobials: Anti-infectives (From admission, onward)   Start     Dose/Rate Route Frequency Ordered Stop   06/10/20 1230  ceFAZolin (ANCEF) IVPB 2g/100 mL premix     Discontinue     2 g 200 mL/hr over 30 Minutes Intravenous Every 8 hours 06/10/20 1205     06/07/20 0000  vancomycin (VANCOREADY) IVPB 500 mg/100 mL  Status:  Discontinued        500 mg 100 mL/hr over 60 Minutes Intravenous Every 12 hours 06/06/20 1316 06/10/20 1141   06/06/20 1300  ceFEPIme (MAXIPIME) 2 g in sodium chloride 0.9 % 100 mL IVPB  Status:  Discontinued        2 g 200 mL/hr over 30 Minutes Intravenous Every 12 hours 06/06/20 1209 06/10/20 1141   06/06/20 1300  vancomycin (VANCOCIN) IVPB 1000 mg/200 mL premix        1,000 mg 200 mL/hr over 60 Minutes Intravenous  Once 06/06/20 1209 06/06/20 1353       Objective: Vitals: Today's Vitals   06/14/20 2206 06/15/20 0500 06/15/20 0748 06/15/20 0807  BP:  132/73 (!) 149/76   Pulse:  88 87   Resp:  16    Temp:  98.5 F (36.9 C)    TempSrc:  Oral    SpO2:  97% 97%   Weight:  63.6 kg    Height:      PainSc: 7    0-No pain    Intake/Output Summary (Last 24 hours) at 06/15/2020 1013 Last data filed at 06/15/2020  0807 Gross per 24 hour  Intake 396 ml  Output 1600 ml  Net -1204 ml   Filed Weights   06/13/20 0500 06/14/20 0647 06/15/20 0500  Weight: 65.5 kg 64.6 kg 63.6 kg   Weight change: -1 kg   Intake/Output from previous day: 06/30 0701 - 07/01 0700 In: 280 [P.O.:180; IV Piggyback:100] Out: 1000 [Urine:1000] Intake/Output this shift: Total I/O In: 236 [P.O.:236] Out: 600 [Urine:600]  Examination:  General exam: AAOx3, weak, frail,NAD,weak appearing. HEENT:Oral mucosa moist, Ear/Nose WNL grossly,dentition normal. Respiratory system: bilaterally clear,no wheezing or crackles,no use of accessory muscle, non tender. Cardiovascular system: S1 & S2 +,regular, No JVD. Gastrointestinal system: Abdomen soft, NT,ND, BS+. Nervous System:Alert, awake, moving extremities and grossly nonfocal Extremities: No edema, distal peripheral pulses palpable.  Skin: No rashes,no icterus. MSK: Normal muscle bulk,tone, power  Data Reviewed: I have  personally reviewed following labs and imaging studies CBC: Recent Labs  Lab 06/11/20 0348 06/12/20 0410 06/13/20 0402 06/14/20 0307 06/15/20 0440  WBC 14.9* 14.1* 10.4 8.3 7.5  NEUTROABS 12.2* 11.8* 8.4* 6.4 6.0  HGB 9.3* 9.3* 9.1* 9.3* 9.3*  HCT 28.9* 29.1* 28.1* 27.7* 28.6*  MCV 94.4 96.0 93.7 93.3 94.7  PLT 49* 70* 98* 126* 097*   Basic Metabolic Panel: Recent Labs  Lab 06/10/20 0432 06/10/20 0432 06/11/20 0348 06/11/20 1830 06/12/20 0410 06/13/20 0402 06/14/20 0307 06/15/20 0440  NA 134*   < > 136  --  136 138 136 134*  K 3.9   < > 3.7  --  3.6 3.4* 3.8 3.3*  CL 108   < > 110  --  109 99 106 102  CO2 18*   < > 20*  --  19* 29 23 22   GLUCOSE 128*   < > 142*  --  223* 99 198* 199*  BUN 34*   < > 26*  --  23 8 15 13   CREATININE 0.84   < > 0.82  --  0.80 0.88 0.57* 0.52*  CALCIUM 7.2*   < > 7.4*  --  7.4* 8.1* 7.4* 7.4*  MG 1.9  --   --   --   --   --   --   --   PHOS  --   --  <1.0* 1.9* 1.7* 3.4 2.3*  --    < > = values in this  interval not displayed.   GFR: Estimated Creatinine Clearance: 70.7 mL/min (A) (by C-G formula based on SCr of 0.52 mg/dL (L)). Liver Function Tests: Recent Labs  Lab 06/11/20 0348 06/12/20 0410 06/13/20 0402 06/14/20 0307 06/15/20 0440  AST 20 16 11* 13* 12*  ALT 47* 27 13 11 8   ALKPHOS 72 80 33* 81 79  BILITOT 1.1 1.1 0.8 0.9 1.1  PROT 4.0* 4.1* 5.0* 3.6* 4.2*  ALBUMIN 1.8* 2.0* 2.7* 1.8* 2.0*   No results for input(s): LIPASE, AMYLASE in the last 168 hours. No results for input(s): AMMONIA in the last 168 hours. Coagulation Profile: No results for input(s): INR, PROTIME in the last 168 hours. Cardiac Enzymes: Recent Labs  Lab 06/10/20 1128  CKTOTAL 87  CKMB 22.2*   BNP (last 3 results) No results for input(s): PROBNP in the last 8760 hours. HbA1C: No results for input(s): HGBA1C in the last 72 hours. CBG: Recent Labs  Lab 06/14/20 0756 06/14/20 1152 06/14/20 2144 06/15/20 0243 06/15/20 0734  GLUCAP 195* 247* 144* 175* 208*   Lipid Profile: No results for input(s): CHOL, HDL, LDLCALC, TRIG, CHOLHDL, LDLDIRECT in the last 72 hours. Thyroid Function Tests: No results for input(s): TSH, T4TOTAL, FREET4, T3FREE, THYROIDAB in the last 72 hours. Anemia Panel: No results for input(s): VITAMINB12, FOLATE, FERRITIN, TIBC, IRON, RETICCTPCT in the last 72 hours. Sepsis Labs: Recent Labs  Lab 06/10/20 1128 06/11/20 0348  LATICACIDVEN 1.2 1.1    Recent Results (from the past 240 hour(s))  SARS Coronavirus 2 by RT PCR (hospital order, performed in [facility] hospital lab) Nasopharyngeal Nasopharyngeal Swab     Status: None   Collection Time: 06/05/20  6:23 PM   Specimen: Nasopharyngeal Swab  Result Value Ref Range Status   SARS Coronavirus 2 NEGATIVE NEGATIVE Final    Comment: (NOTE) SARS-CoV-2 target nucleic acids are NOT DETECTED.  The SARS-CoV-2 RNA is generally detectable in upper and lower respiratory specimens during the acute phase of infection. The  lowest  concentration of SARS-CoV-2 viral copies this assay can detect is 250 copies / mL. A negative result does not preclude SARS-CoV-2 infection and should not be used as the sole basis for treatment or other patient management decisions.  A negative result may occur with improper specimen collection / handling, submission of specimen other than nasopharyngeal swab, presence of viral mutation(s) within the areas targeted by this assay, and inadequate number of viral copies (<250 copies / mL). A negative result must be combined with clinical observations, patient history, and epidemiological information.  Fact Sheet for Patients:   StrictlyIdeas.no  Fact Sheet for Healthcare Providers: BankingDealers.co.za  This test is not yet approved or  cleared by the [place] FDA and has been authorized for detection and/or diagnosis of SARS-CoV-2 by FDA under an Emergency Use Authorization (EUA).  This EUA will remain in effect (meaning this test can be used) for the duration of the COVID-19 declaration under Section 564(b)(1) of the Act, 21 U.S.C. section 360bbb-3(b)(1), unless the authorization is terminated or revoked sooner.  Performed at [facility], [place] [address] [place], [place] [address]   Culture, blood (Routine X 2) w Reflex to ID Panel     Status: None   Collection Time: 06/06/20 12:42 PM   Specimen: BLOOD RIGHT HAND  Result Value Ref Range Status   Specimen Description   Final    BLOOD RIGHT HAND Performed at [place] [address], [place], [place] [address]    Special Requests   Final    BOTTLES DRAWN AEROBIC ONLY Blood Culture results may not be optimal due to an inadequate volume of blood received in culture bottles Performed at [facility] [place] [address] [place], [place] [address]    Culture   Final    NO GROWTH 5 DAYS Performed at [facility], [place] [address] [place], [place] [address]    Report Status 06/11/2020 FINAL  Final  Culture, blood (Routine X 2) w Reflex to ID Panel     Status: None   Collection Time: 06/06/20 12:42 PM   Specimen: BLOOD LEFT HAND  Result Value Ref Range Status   Specimen Description   Final    BLOOD LEFT HAND Performed at [place] [address], [place], [place] [address]    Special Requests   Final    BOTTLES DRAWN AEROBIC ONLY Blood Culture results may not be optimal due to an inadequate volume of blood received in culture bottles Performed at [place] [address] [place], [place] [address]    Culture   Final    NO GROWTH 5 DAYS Performed at [facility], [place] [address] [place], [place] [address]    Report Status 06/11/2020 FINAL  Final  MRSA PCR Screening     Status: None   Collection Time: 06/06/20  5:45 PM   Specimen: Nasal Mucosa; Nasopharyngeal  Result Value Ref Range Status   MRSA by PCR NEGATIVE NEGATIVE Final    Comment:        The GeneXpert MRSA Assay (FDA approved for NASAL specimens only), is one component of a comprehensive MRSA colonization surveillance program. It is not intended to diagnose MRSA infection nor to guide or monitor treatment for MRSA infections. Performed at [facility], [place] [address], [place], [place] [address]   Culture, Urine     Status: None   Collection Time: 06/06/20  6:38 PM  Specimen: Urine, Catheterized  Result Value Ref Range Status   Specimen Description   Final    URINE, CATHETERIZED Performed at [place] [address] [place], [place] [address]    Special Requests   Final    NONE Performed at [facility], [place] [address] [place], [place] [address]    Culture   Final    NO GROWTH Performed at [facility], [place] [address] [place], [place] [address]    Report Status 06/08/2020 FINAL  Final  C  Difficile Quick Screen w PCR reflex     Status: None   Collection Time: 06/07/20  1:30 AM   Specimen: STOOL  Result Value Ref Range Status   C Diff antigen NEGATIVE NEGATIVE Final   C Diff toxin NEGATIVE NEGATIVE Final   C Diff interpretation No C. difficile detected.  Final    Comment: Performed at [facility], [place] [address] [place], [place] [address]      Radiology Studies: No results found.   LOS: 10 days   Antonieta Pert, MD [facility]  06/15/2020, 10:13 AM

## 2020-06-15 NOTE — Progress Notes (Signed)
    Durable Medical Equipment  (From admission, onward)         Start     Ordered   06/15/20 1356  For home use only DME Bedside commode  Once       Question:  Patient needs a bedside commode to treat with the following condition  Answer:  Fear for personal safety   06/15/20 1358   06/15/20 1352  For home use only DME Hospital bed  Once       Question Answer Comment  Length of Need Lifetime   Patient has (list medical condition): xLeukemia, Cancer and CHF   The above medical condition requires: Patient requires the ability to reposition frequently   Head must be elevated greater than: 45 degrees   Bed type Semi-electric   Support Surface: Gel Overlay      06/15/20 1358   06/15/20 1351  For home use only DME 4 wheeled rolling walker with seat  Once       Question:  Patient needs a walker to treat with the following condition  Answer:  Fear for personal safety   06/15/20 1358

## 2020-06-15 NOTE — Care Management Important Message (Signed)
Important Message  Patient Details IM Letter given to [name] RN Case Manager to present to the Patient Name: Tony Marshall MRN: [id number] Date of Birth: [birth year]   Medicare Important Message Given:  Yes     Kerin Salen 06/15/2020, 10:31 AM

## 2020-06-15 NOTE — Progress Notes (Addendum)
Nutrition Follow-up  DOCUMENTATION CODES:   Non-severe (moderate) malnutrition in context of chronic illness  INTERVENTION:  - continue [employer] BID and 2 scoops Beneprotein TID. - if patient does not d/c 7/2, recommend trial of appetite stimulant, if not contraindicated. - recommend check serum Mg given hypokalemia and hypophosphatemia with weakness; last checked 6/26 and was low end of normal.   NUTRITION DIAGNOSIS:   Moderate Malnutrition related to chronic illness, cancer and cancer related treatments as evidenced by mild fat depletion, moderate fat depletion, moderate muscle depletion. -ongoing  GOAL:   Patient will meet greater than or equal to 90% of their needs -unmet on average   MONITOR:   PO intake, Supplement acceptance, Labs, Weight trends  ASSESSMENT:   79 y.o. male with medical hx of type 2 DM, diverticulosis, GERD, HLD, HTN, and CLL diagnosed 09/2013 undergoing active treatment with bendamustine and Rituxan (last treatment: 9/14) complicated by recurrent episodes of fatigue, generalized weakness, weight loss, lightheadedness, and poor appetite. He was seen in the ED 6/1 with severe dehydration and a 17 pound weight loss but was able to be sent home after volume resuscitation. He had severe constipation a few days PTA and used OTC agents to treat which then led to refractory diarrhea. He has been refusing food and beverages and experiencing worsening weakness. He was transferred from the [facility] to the ED due to severe AMS and significant hyperglycemia.  He has been eating mainly 25% at meals and has been accepting oral nutrition supplements ~50% of the time offered. Weight had been up from admission but is now slowly trending back down. Flow sheet documentation indicates mild edema to RUE and LLE and deep pitting edema to RLE.   He has not had a BM since 6/26 which may be contributing to decreased appetite and intakes.   Per notes: - RLE  cellulitis--improving - pancytopenia - acute metabolic encephalopathy--resolved - acute on chronic CHF - hx of type 2 DM with A1c of 7.7% - generalized weakness and debility--PT and OT recommended SNF but patient and family declined and prefer home with Home Health - possible d/c 7/2    Labs reviewed; CBGs: 175, 208, 212 mg/dl, Na: 134 mmol/l, K: 3.3 mmol/l, creatinine: 0.52 mg/dl, Ca: 7.4 mg/dl. Medications reviewed; 1 mg folvite/day, sliding scale novolog, 5 units lantus/day, 1 tablet multivitamin with minerals/day, 17 g miralax x1 dose 7/1, 1 packet phos-nak TID, 20 mEq Klor-Con x2 doses 6/30 and x2 doses 7/1, 1 tablet senokot/day, 100 mg thiamine/day.   Diet Order:   Diet Order            Diet heart healthy/carb modified Room service appropriate? No; Fluid consistency: Thin  Diet effective now                 EDUCATION NEEDS:   No education needs have been identified at this time  Skin:  Skin Assessment: Skin Integrity Issues: Skin Integrity Issues:: Other (Comment) Other: non-pressure injury to buttocks (6/24)  Last BM:  6/26  Height:   Ht Readings from Last 1 Encounters:  06/06/20 5' 8.5" (1.74 m)    Weight:   Wt Readings from Last 1 Encounters:  06/15/20 63.6 kg     Estimated Nutritional Needs:  Kcal:  1920-2200 kcal Protein:  100-115 grams Fluid:  >/= 2.2 L/day     Jarome Matin, MS, RD, LDN, CNSC Inpatient Clinical Dietitian RD pager # available in [facility]  After hours/weekend pager # available in [facility]

## 2020-06-16 LAB — COMPREHENSIVE METABOLIC PANEL
ALT: 8 U/L (ref 0–44)
AST: 13 U/L — ABNORMAL LOW (ref 15–41)
Albumin: 1.9 g/dL — ABNORMAL LOW (ref 3.5–5.0)
Alkaline Phosphatase: 72 U/L (ref 38–126)
Anion gap: 11 (ref 5–15)
BUN: 12 mg/dL (ref 8–23)
CO2: 24 mmol/L (ref 22–32)
Calcium: 7.1 mg/dL — ABNORMAL LOW (ref 8.9–10.3)
Chloride: 100 mmol/L (ref 98–111)
Creatinine, Ser: 0.51 mg/dL — ABNORMAL LOW (ref 0.61–1.24)
GFR calc Af Amer: >60 mL/min (ref >60)
GFR calc non Af Amer: >60 mL/min (ref >60)
Glucose, Bld: 197 mg/dL — ABNORMAL HIGH (ref 70–99)
Potassium: 3.3 mmol/L — ABNORMAL LOW (ref 3.5–5.1)
Sodium: 135 mmol/L (ref 135–145)
Total Bilirubin: 1.2 mg/dL (ref 0.3–1.2)
Total Protein: 3.9 g/dL — ABNORMAL LOW (ref 6.5–8.1)

## 2020-06-16 LAB — CBC WITH DIFFERENTIAL/PLATELET
Abs Immature Granulocytes: 0.07 10*3/uL (ref 0.00–0.07)
Basophils Absolute: 0 10*3/uL (ref 0.0–0.1)
Basophils Relative: 0 %
Eosinophils Absolute: 0 10*3/uL (ref 0.0–0.5)
Eosinophils Relative: 0 %
HCT: 27.9 % — ABNORMAL LOW (ref 39.0–52.0)
Hemoglobin: 9.1 g/dL — ABNORMAL LOW (ref 13.0–17.0)
Immature Granulocytes: 2 %
Lymphocytes Relative: 15 %
Lymphs Abs: 0.7 10*3/uL (ref 0.7–4.0)
MCH: 31.1 pg (ref 26.0–34.0)
MCHC: 32.6 g/dL (ref 30.0–36.0)
MCV: 95.2 fL (ref 80.0–100.0)
Monocytes Absolute: 0.3 10*3/uL (ref 0.1–1.0)
Monocytes Relative: 7 %
Neutro Abs: 3.6 10*3/uL (ref 1.7–7.7)
Neutrophils Relative %: 76 %
Platelets: 124 10*3/uL — ABNORMAL LOW (ref 150–400)
RBC: 2.93 MIL/uL — ABNORMAL LOW (ref 4.22–5.81)
RDW: 17.7 % — ABNORMAL HIGH (ref 11.5–15.5)
WBC: 4.8 10*3/uL (ref 4.0–10.5)
nRBC: 0 % (ref 0.0–0.2)

## 2020-06-16 LAB — GLUCOSE, CAPILLARY
Glucose-Capillary: 232 mg/dL — ABNORMAL HIGH (ref 70–99)
Glucose-Capillary: 307 mg/dL — ABNORMAL HIGH (ref 70–99)
Glucose-Capillary: 135 mg/dL — ABNORMAL HIGH (ref 70–99)
Glucose-Capillary: 172 mg/dL — ABNORMAL HIGH (ref 70–99)

## 2020-06-16 LAB — MAGNESIUM: Magnesium: 1.3 mg/dL — ABNORMAL LOW (ref 1.7–2.4)

## 2020-06-16 MED ORDER — ONDANSETRON HCL 4 MG/2ML IJ SOLN
4.0000 mg | Freq: Four times a day (QID) | INTRAMUSCULAR | Status: DC | PRN
  Administered 2020-06-16: 4 mg via INTRAVENOUS
  Filled 2020-06-16: qty 2

## 2020-06-16 MED ORDER — INSULIN GLARGINE 100 UNIT/ML ~~LOC~~ SOLN
3.0000 [IU] | Freq: Once | SUBCUTANEOUS | Status: AC
  Administered 2020-06-16: 3 [IU] via SUBCUTANEOUS
  Filled 2020-06-16: qty 0.03

## 2020-06-16 MED ORDER — INSULIN GLARGINE 100 UNIT/ML ~~LOC~~ SOLN
10.0000 [IU] | Freq: Every day | SUBCUTANEOUS | Status: DC
  Administered 2020-06-17: 10 [IU] via SUBCUTANEOUS
  Filled 2020-06-16: qty 0.1

## 2020-06-16 NOTE — Progress Notes (Signed)
Occupational Therapy Treatment Patient Details Name: Tony Marshall MRN: [id number] DOB: 21-Dec-1940 Today's Date: 06/16/2020    History of present illness 79 year old with CLL on Rituxan, Bendeka, diabetes, hypertension, hyperlipidemia admitted with diarrhea, malaise, mild DKA requiring insulin drip. Labs significant for pancytopenia, mild hyperbilirubinemia, electrolyte abnormalities.  Transferred to ICU on 6/22 for hypotension, sepsis with elevated lactic acid.  Pt also with RLE cellulitis and sepsis - onset 6/23 following RLE leg trauma in ER. DVT negative   OT comments  Pt. Was seen for skilled OT. Pt. And wife were ed on increasing safety for shower transfer by use of shower seat, non skid mat and grab bars. They were provided with contact information for volunteer group to install grab bars. Pt. Was instucted on performing B UE HEP and pt. And wife verbalized understanding. Pt. And wife were ed on importance of getting OOB and sitting EOB to increase activity tolerance and strength and they verbalized understanding.    Follow Up Recommendations  Home health OT    Equipment Recommendations  3 in 1 bedside commode    Recommendations for Other Services      Precautions / Restrictions Precautions Precautions: Fall Restrictions Weight Bearing Restrictions: No       Mobility Bed Mobility               General bed mobility comments: Pt. refused to sit EOB secondary he felt nausous.   Transfers                 General transfer comment: refused OOB and to sit up in chair.     Balance                                           ADL either performed or assessed with clinical judgement   ADL   Eating/Feeding: Set up;Supervision/ safety;Sitting   Grooming: Wash/dry face;Oral care;Wash/dry hands;Set up;Supervision/safety;Sitting                                 General ADL Comments: Pt. refused further ADLs. Pt. states he is going  home later and he will take a shower.      Vision       Perception     Praxis      Cognition Arousal/Alertness: Awake/alert Behavior During Therapy: WFL for tasks assessed/performed Overall Cognitive Status: Within Functional Limits for tasks assessed                                          Exercises     Shoulder Instructions       General Comments Pt. and wife ed on HEP for B UE. They were ed to use small weights for chest press, over head press, bicep, shld flex and horizontal abd.     Pertinent Vitals/ Pain       Pain Assessment: No/denies pain  Home Living                                          Prior Functioning/Environment  Frequency  Min 2X/week        Progress Toward Goals  OT Goals(current goals can now be found in the care plan section)  Progress towards OT goals: Progressing toward goals  Acute Rehab OT Goals Patient Stated Goal: to go home today OT Goal Formulation: With patient/family Time For Goal Achievement: 06/25/20 Potential to Achieve Goals: Good ADL Goals Pt Will Perform Upper Body Bathing: with modified independence Pt Will Perform Lower Body Bathing: with min guard assist;sit to/from stand Pt Will Transfer to Toilet: with min guard assist;bedside commode;ambulating Additional ADL Goal #1: Pt will demosntrate anticipatory awareness during ADL task Additional ADL Goal #2: Pt will answer 4/4 orientation questions with 100% accuracy using external cues  Plan      Co-evaluation                 AM-PAC OT "6 Clicks" Daily Activity     Outcome Measure   Help from another person eating meals?: None Help from another person taking care of personal grooming?: A Little Help from another person toileting, which includes using toliet, bedpan, or urinal?: A Lot Help from another person bathing (including washing, rinsing, drying)?: A Lot Help from another person to put on and taking  off regular upper body clothing?: A Little Help from another person to put on and taking off regular lower body clothing?: A Lot 6 Click Score: 16    End of Session    OT Visit Diagnosis: Unsteadiness on feet (R26.81);Other abnormalities of gait and mobility (R26.89);Muscle weakness (generalized) (M62.81);Other symptoms and signs involving cognitive function;Pain   Activity Tolerance Other (comment) (limited secondary to nausea)   Patient Left in bed;with call bell/phone within reach;with bed alarm set;with family/visitor present   Nurse Communication  (ok therapy)        Time: 0300-9233 OT Time Calculation (min): 20 min  Charges: OT General Charges $OT Visit: 1 Visit OT Treatments $Self Care/Home Management : 8-22 mins  Reece Packer OT/L   [place]oma Gotwalt 06/16/2020, 10:24 AM

## 2020-06-16 NOTE — Progress Notes (Signed)
Progress Note  Patient Name: Tony Marshall Date of Encounter: 06/16/2020  Primary Cardiologist: Skeet Latch, MD  Subjective   No CP, SOB, edema, palpitations.  Inpatient Medications    Scheduled Meds: . acetaminophen  650 mg Oral Once  . carvedilol  12.5 mg Oral BID WC  . Chlorhexidine Gluconate Cloth  6 each Topical Daily  . enoxaparin (LOVENOX) injection  40 mg Subcutaneous Q24H  . feeding supplement (KATE [employer] STANDARD 1.4)  325 mL Oral BID BM  . folic acid  1 mg Oral Daily  . insulin aspart  0-9 Units Subcutaneous TID WC  . insulin glargine  5 Units Subcutaneous Daily  . multivitamin with minerals  1 tablet Oral Daily  . pantoprazole  40 mg Oral Daily  . potassium & sodium phosphates  1 packet Oral TID WC & HS  . potassium chloride  20 mEq Oral BID  . protein supplement  12 g Oral TID WC  . rosuvastatin  5 mg Oral QHS  . sacubitril-valsartan  1 tablet Oral BID  . senna  1 tablet Oral Daily  . sodium chloride flush  10-40 mL Intracatheter Q12H  . thiamine  100 mg Oral Daily   Continuous Infusions: . sodium chloride    .  ceFAZolin (ANCEF) IV 2 g (06/16/20 0611)   PRN Meds: acetaminophen **OR** [DISCONTINUED] acetaminophen, ALPRAZolam, dextrose, lip balm, melatonin, naLOXone (NARCAN)  injection, oxymetazoline, sodium chloride flush, traMADol   Vital Signs    Vitals:   06/15/20 1241 06/15/20 1656 06/15/20 1938 06/16/20 0531  BP: (!) 149/79 (!) 149/76 (!) 150/82 (!) 144/83  Pulse: 91 73 82 86  Resp: 18  14 14   Temp: 98.3 F (36.8 C)  98.4 F (36.9 C) 98.5 F (36.9 C)  TempSrc: Oral  Oral Oral  SpO2: 96%  97% 95%  Weight:    64.2 kg  Height:        Intake/Output Summary (Last 24 hours) at 06/16/2020 0755 Last data filed at 06/16/2020 0404 Gross per 24 hour  Intake 446 ml  Output --  Net 446 ml   Last 3 Weights 06/16/2020 06/15/2020 06/14/2020  Weight (lbs) 141 lb 8.6 oz 140 lb 3.4 oz 142 lb 6.7 oz  Weight (kg) 64.2 kg 63.6 kg 64.6 kg      Telemetry    NSR with rare blocked PACs - Personally Reviewed  Physical Exam   GEN: No acute distress.  HEENT: Normocephalic, atraumatic, sclera non-icteric. Neck: No JVD or bruits. Cardiac: RRR no murmurs, rubs, or gallops.  Radials/DP/PT 1+ and equal bilaterally.  Respiratory: Clear to auscultation bilaterally. Breathing is unlabored. GI: Soft, nontender, non-distended, BS +x 4. MS: no deformity.  Extremities: No clubbing or cyanosis. No significant edema.  Neuro:  AAOx3. Follows commands. Psych:  Responds to questions appropriately with a normal affect.  Labs    High Sensitivity Troponin:   Recent Labs  Lab 06/05/20 1710  TROPONINIHS 11      Cardiac EnzymesNo results for input(s): TROPONINI in the last 168 hours. No results for input(s): TROPIPOC in the last 168 hours.   Chemistry Recent Labs  Lab 06/14/20 0307 06/15/20 0440 06/16/20 0358  NA 136 134* 135  K 3.8 3.3* 3.3*  CL 106 102 100  CO2 23 22 24   GLUCOSE 198* 199* 197*  BUN 15 13 12   CREATININE 0.57* 0.52* 0.51*  CALCIUM 7.4* 7.4* 7.1*  PROT 3.6* 4.2* 3.9*  ALBUMIN 1.8* 2.0* 1.9*  AST 13* 12* 13*  ALT 11 8 8   ALKPHOS 81 79 72  BILITOT 0.9 1.1 1.2  GFRNONAA >60 >60 >60  GFRAA >60 >60 >60  ANIONGAP 7 10 11      Hematology Recent Labs  Lab 06/14/20 0307 06/15/20 0440 06/16/20 0358  WBC 8.3 7.5 4.8  RBC 2.97* 3.02* 2.93*  HGB 9.3* 9.3* 9.1*  HCT 27.7* 28.6* 27.9*  MCV 93.3 94.7 95.2  MCH 31.3 30.8 31.1  MCHC 33.6 32.5 32.6  RDW 18.3* 17.9* 17.7*  PLT 126* 126* 124*    BNPNo results for input(s): BNP, PROBNP in the last 168 hours.   DDimer No results for input(s): DDIMER in the last 168 hours.   Radiology    No results found.  Cardiac Studies   Echo 06/07/20 1. Left ventricular ejection fraction, by estimation, is 25 to 30%. The  left ventricle has severely decreased function. The left ventricle has no  regional wall motion abnormalities. Left ventricular diastolic parameters   are consistent with Grade II  diastolic dysfunction (pseudonormalization).  2. Right ventricular systolic function is normal. The right ventricular  size is normal. There is normal pulmonary artery systolic pressure.  3. The mitral valve is normal in structure. No evidence of mitral valve  regurgitation. No evidence of mitral stenosis.  4. The aortic valve is grossly normal. Aortic valve regurgitation is not  visualized. No aortic stenosis is present.  5. The inferior vena cava is normal in size with greater than 50%  respiratory variability, suggesting right atrial pressure of 3 mmHg.  Patient Profile     79 y.o. male with history of CLL, DM, OSA, HTN, HLD, prior habitual alcohol use, GERD, Schatzki's ring. Tumultuous course recently related to CLL, initiation of chemotherapy, dehydration, weight loss, constipation and subsequent diarrhea after laxatives. He was admitted 06/05/2020 with confusion, weakness, and fatigue, felt to have septic shock in setting of RLL cellulitis. He was noted to be hyponatremic, hypokalemic, hypomagnesemic, febrile, in metabolic acidosis with anion gap and lactic acidosis, with AKI, decreased TSH, toxic metabolic encephalopathy, and chemo-induced neutropenia. Co-ox was relatively normal. 2D echo was obtained which showed severe LV dysfunction with EF 25-30%, grade 2 DD, normal RV, therefore cardiology consulted. Coronary atherosclerosis mentioned on CT.  Assessment & Plan    1. Septic shock in context of RLL cellulitis, mild DKA on admission, required aggressive fluid resuscitation. LE duplex was negative for DVT. - Per primary team  2. Cardiomyopathy, etiology unknown, newly recognized -hsTroponin was low this admission. Differential for LV dysfunction includes stress cardiomyopathy, underlying occult coronary disease, previous regular ETOH consumption or chemotherapy. Admit EKG showed new TWI across anterior precordial leads. He had not had any recent  angina. - plan to pursue [facility] as outpatient - holding aspirin with anemia/thrombocytopenia (stability will need to be determined before cath) - rosuvastatin restarted; LDL 61 - slow initiation of GDMT for CHF given hypotension earlier this admission - tolerating Entresto/carvedilol dosing - received 3.125mg  of carvedilol yesterday AM, increased to 12.5mg  yesterday PM and tolerating well - will follow BP response on these 2 new meds for now, can optimize further as outpatient - prescribed CHF book - reviewed sodium restriction, 2L fluid restriction, daily weights with patient/wife - consider SGLT2i given reduced EF and diabetes  3. Metabolic encephalopathy - improved.  4. Prolonged QTc on admission - likely in context of metabolic derangement, remains improved.  5. Suppressed TSH <0.200 - free T4 normal, total T3 slightly low so may represent relationship to acute illness. - Per  primary team  6. CLL - on Rituxin and Bendeka. - Per IM/oncology  7. Hypokalemia/hypomagnesemia - defer lyte management to primary team who has been managing this  Requested f/u from office - first available 7/19 with APP. Appt info placed on AVS. Should have primary care/oncology f/u within 1 week of discharge as well for primary issues.  For questions or updates, please contact [facility] Please consult [email or web address] for contact info under Cardiology/STEMI.  Signed, Charlie Pitter, PA-C 06/16/2020, 7:55 AM

## 2020-06-16 NOTE — Progress Notes (Signed)
CHART NOTE I saw the patient today for a social visit.  His wife was at the bedside.  The patient started feeling much better compared to last week.  He denied having any current complaints today except for fatigue. He is expected to be discharged home very soon. I will arrange for the patient a follow-up appointment with me at the cancer center for more detailed discussion of his condition and future treatment options.  I indicated to the patient that I may not resume his treatment with cycle #4 as previously planned because of the significant toxicity with the last cycle of his treatment. Thank you for taking good care of Mr. Bolle.  Please call if you have any questions.

## 2020-06-16 NOTE — Progress Notes (Signed)
PROGRESS NOTE    Tony Marshall  JOI:[id number] DOB: 10/31/1941 DOA: 06/05/2020 PCP: Elayne Snare, MD   Chef Complaints: diarrhea/malaise  Brief Narrative: 79 year old with CLL on Rituxan, [place], diabetes, hypertension, hyperlipidemia admitted with diarrhea, malaise, mild DKA requiring insulin drip. Labs significant for pancytopenia, mild hyperbilirubinemia, electrolyte abnormalities. Transferred to ICU on 6/22 for hypotension, sepsis with elevated lactic acid. PCCM consulted for help with management. Had to be placed on pressors. He stabilized. He was weaned off of Levophed 6/25. He was stabilized and transferred to [facility] service.  Subjective:  Wife at the bedside.  Patient complains of nausea. Redness on his right lower leg better behind the thigh but he still has redness in his calf area No fever.  Feels weak. Poor oral intake.   Assessment & Plan:  Septic shock due to right lower extremity cellulitis in the setting of immunosuppression with CLL: Initially on vasopressors off since 6/25, also was on vancomycin and cefepime.  Overall significantly improved but still with erythema tenderness and induration in the right calf area.  Continue to monitor this area and keep on IV Ancef for now.  Will likely need at least 2 weeks of antibiotics given his immunosuppression status Close follow-up, change to p.o. Keflex upon discharge  Pancytopenia in the setting of CLL/CLL with immunosuppression: Initially leukopenic.  WC count stable hemoglobin stable at 9.1 g platelet and 120 K.  Recent Labs  Lab 06/14/20 0307 06/15/20 0440 06/16/20 0358  HGB 9.3* 9.3* 9.1*  HCT 27.7* 28.6* 27.9*  WBC 8.3 7.5 4.8  PLT 126* 126* 470*   Acute metabolic encephalopathy due to sepsis.  Mental status is stable.  Alert awake but weak and frail.   Acute on chronic combined systolic and diastolic congestive heart failure.  Echo with EF 25 to 30% currently euvolemic followed by cardiology uptitrating  patient's beta-blocker continue Entresto continue to optimize medication per cardiology.  T2DM uncontrolled with A1c 7.7, uncontrolled hyperglycemia blood sugar in 300.  Increase Lantus continue Lantus sliding scale. Recent Labs  Lab 06/15/20 1100 06/15/20 1622 06/15/20 1936 06/16/20 0724 06/16/20 1117  GLUCAP 212* 115* 168* 232* 307*   Hypokalemia/hypophosphatemia continue to monitor and replete. Ka t 3.3   Generalized weakness/debility: Patient appears weak frail.  PT OT suggested skilled nursing facility but refused by patient and family instead opted for home with home health. Patient and patient's wife do not feel they are ready for home yet and reports they will be ready tomorrow  DVT prophylaxis: enoxaparin (LOVENOX) injection 40 mg Start: 06/13/20 1200 Code Status:FUll Family Communication: plan of care discussed with patient at bedside.  Status is: Inpatient  Remains inpatient appropriate because:Ongoing diagnostic testing needed not appropriate for outpatient work up, Unsafe d/c plan and Inpatient level of care appropriate due to severity of illness IV antibiotic needs.   Dispo:  Patient From: Home  Planned Disposition: [place] refused.  Expected discharge date: 1 day  Medically stable for discharge: No plan on discharge tomorrow if cellulitis appears to stable  Nutrition: Diet Order            Diet heart healthy/carb modified Room service appropriate? No; Fluid consistency: Thin  Diet effective now                 Nutrition Problem: Moderate Malnutrition Etiology: chronic illness, cancer and cancer related treatments Signs/Symptoms: mild fat depletion, moderate fat depletion, moderate muscle depletion Interventions: MVI, Other (Comment) (Beneprotein and [employer]) Body mass index  is 21.21 kg/m.  Consultants:see note  Procedures:see note Microbiology:see note  Medications: Scheduled Meds: . acetaminophen  650 mg Oral Once  . carvedilol   12.5 mg Oral BID WC  . Chlorhexidine Gluconate Cloth  6 each Topical Daily  . enoxaparin (LOVENOX) injection  40 mg Subcutaneous Q24H  . feeding supplement (KATE FARMS STANDARD 1.4)  325 mL Oral BID BM  . folic acid  1 mg Oral Daily  . insulin aspart  0-9 Units Subcutaneous TID WC  . insulin glargine  5 Units Subcutaneous Daily  . multivitamin with minerals  1 tablet Oral Daily  . pantoprazole  40 mg Oral Daily  . potassium & sodium phosphates  1 packet Oral TID WC & HS  . protein supplement  12 g Oral TID WC  . rosuvastatin  5 mg Oral QHS  . sacubitril-valsartan  1 tablet Oral BID  . senna  1 tablet Oral Daily  . sodium chloride flush  10-40 mL Intracatheter Q12H  . thiamine  100 mg Oral Daily   Continuous Infusions: . sodium chloride    .  ceFAZolin (ANCEF) IV 2 g (06/16/20 6063)    Antimicrobials: Anti-infectives (From admission, onward)   Start     Dose/Rate Route Frequency Ordered Stop   06/10/20 1230  ceFAZolin (ANCEF) IVPB 2g/100 mL premix     Discontinue     2 g 200 mL/hr over 30 Minutes Intravenous Every 8 hours 06/10/20 1205     06/07/20 0000  vancomycin (VANCOREADY) IVPB 500 mg/100 mL  Status:  Discontinued        500 mg 100 mL/hr over 60 Minutes Intravenous Every 12 hours 06/06/20 1316 06/10/20 1141   06/06/20 1300  ceFEPIme (MAXIPIME) 2 g in sodium chloride 0.9 % 100 mL IVPB  Status:  Discontinued        2 g 200 mL/hr over 30 Minutes Intravenous Every 12 hours 06/06/20 1209 06/10/20 1141   06/06/20 1300  vancomycin (VANCOCIN) IVPB 1000 mg/200 mL premix        1,000 mg 200 mL/hr over 60 Minutes Intravenous  Once 06/06/20 1209 06/06/20 1353       Objective: Vitals: Today's Vitals   06/15/20 1938 06/16/20 0531 06/16/20 0851 06/16/20 1238  BP: (!) 150/82 (!) 144/83  137/74  Pulse: 82 86  85  Resp: 14 14  18   Temp: 98.4 F (36.9 C) 98.5 F (36.9 C)  98.6 F (37 C)  TempSrc: Oral Oral  Oral  SpO2: 97% 95%  96%  Weight:  64.2 kg    Height:      PainSc:    0-No pain     Intake/Output Summary (Last 24 hours) at 06/16/2020 1244 Last data filed at 06/16/2020 0852 Gross per 24 hour  Intake 360 ml  Output --  Net 360 ml   Filed Weights   06/14/20 0647 06/15/20 0500 06/16/20 0531  Weight: 64.6 kg 63.6 kg 64.2 kg   Weight change: 0.6 kg   Intake/Output from previous day: 07/01 0701 - 07/02 0700 In: 446 [P.O.:236; I.V.:10; IV Piggyback:200] Out: 600 [Urine:600] Intake/Output this shift: Total I/O In: 150 [P.O.:150] Out: -   Examination: General exam: AAO x3, frail, on room air, nauseous, NAD, weak appearing. HEENT:Oral mucosa moist, Ear/Nose WNL grossly, dentition normal. Respiratory system: bilaterally clear,no wheezing or crackles,no use of accessory muscle Cardiovascular system: S1 & S2 +, No JVD,. Gastrointestinal system: Abdomen soft, NT,ND, BS+ Nervous System:Alert, awake, moving extremities and grossly nonfocal Extremities: Right calf  area of erythema tenderness, erythema redness tenderness resolved on the back of the right thigh no edema, distal peripheral pulses palpable.  Skin: No rashes,no icterus. MSK: Normal muscle bulk,tone, power   Data Reviewed: I have personally reviewed following labs and imaging studies CBC: Recent Labs  Lab 06/12/20 0410 06/13/20 0402 06/14/20 0307 06/15/20 0440 06/16/20 0358  WBC 14.1* 10.4 8.3 7.5 4.8  NEUTROABS 11.8* 8.4* 6.4 6.0 3.6  HGB 9.3* 9.1* 9.3* 9.3* 9.1*  HCT 29.1* 28.1* 27.7* 28.6* 27.9*  MCV 96.0 93.7 93.3 94.7 95.2  PLT 70* 98* 126* 126* 518*   Basic Metabolic Panel: Recent Labs  Lab 06/10/20 0432 06/10/20 0432 06/11/20 0348 06/11/20 0348 06/11/20 1830 06/12/20 0410 06/13/20 0402 06/14/20 0307 06/15/20 0440 06/16/20 0358  NA 134*   < > 136   < >  --  136 138 136 134* 135  K 3.9   < > 3.7   < >  --  3.6 3.4* 3.8 3.3* 3.3*  CL 108   < > 110   < >  --  109 99 106 102 100  CO2 18*   < > 20*   < >  --  19* 29 23 22 24   GLUCOSE 128*   < > 142*   < >  --  223* 99  198* 199* 197*  BUN 34*   < > 26*   < >  --  23 8 15 13 12   CREATININE 0.84   < > 0.82   < >  --  0.80 0.88 0.57* 0.52* 0.51*  CALCIUM 7.2*   < > 7.4*   < >  --  7.4* 8.1* 7.4* 7.4* 7.1*  MG 1.9  --   --   --   --   --   --   --   --  1.3*  PHOS  --   --  <1.0*  --  1.9* 1.7* 3.4 2.3*  --   --    < > = values in this interval not displayed.   GFR: Estimated Creatinine Clearance: 71.3 mL/min (A) (by C-G formula based on SCr of 0.51 mg/dL (L)). Liver Function Tests: Recent Labs  Lab 06/12/20 0410 06/13/20 0402 06/14/20 0307 06/15/20 0440 06/16/20 0358  AST 16 11* 13* 12* 13*  ALT 27 13 11 8 8   ALKPHOS 80 33* 81 79 72  BILITOT 1.1 0.8 0.9 1.1 1.2  PROT 4.1* 5.0* 3.6* 4.2* 3.9*  ALBUMIN 2.0* 2.7* 1.8* 2.0* 1.9*   No results for input(s): LIPASE, AMYLASE in the last 168 hours. No results for input(s): AMMONIA in the last 168 hours. Coagulation Profile: No results for input(s): INR, PROTIME in the last 168 hours. Cardiac Enzymes: Recent Labs  Lab 06/10/20 1128  CKTOTAL 87  CKMB 22.2*   BNP (last 3 results) No results for input(s): PROBNP in the last 8760 hours. HbA1C: No results for input(s): HGBA1C in the last 72 hours. CBG: Recent Labs  Lab 06/15/20 1100 06/15/20 1622 06/15/20 1936 06/16/20 0724 06/16/20 1117  GLUCAP 212* 115* 168* 232* 307*   Lipid Profile: No results for input(s): CHOL, HDL, LDLCALC, TRIG, CHOLHDL, LDLDIRECT in the last 72 hours. Thyroid Function Tests: No results for input(s): TSH, T4TOTAL, FREET4, T3FREE, THYROIDAB in the last 72 hours. Anemia Panel: No results for input(s): VITAMINB12, FOLATE, FERRITIN, TIBC, IRON, RETICCTPCT in the last 72 hours. Sepsis Labs: Recent Labs  Lab 06/10/20 1128 06/11/20 0348  LATICACIDVEN 1.2 1.1    Recent  Results (from the past 240 hour(s))  MRSA PCR Screening     Status: None   Collection Time: 06/06/20  5:45 PM   Specimen: Nasal Mucosa; Nasopharyngeal  Result Value Ref Range Status   MRSA by PCR  NEGATIVE NEGATIVE Final    Comment:        The GeneXpert MRSA Assay (FDA approved for NASAL specimens only), is one component of a comprehensive MRSA colonization surveillance program. It is not intended to diagnose MRSA infection nor to guide or monitor treatment for MRSA infections. Performed at [facility], [place] [address], [place], [place] [address]   Culture, Urine     Status: None   Collection Time: 06/06/20  6:38 PM   Specimen: Urine, Catheterized  Result Value Ref Range Status   Specimen Description   Final    URINE, CATHETERIZED Performed at [facility] [address] [place], [place] [address]    Special Requests   Final    NONE Performed at [facility], [place] [address], [place], [place] [address]    Culture   Final    NO GROWTH Performed at [facility], [place] [address] [place], [place] [address]    Report Status 06/08/2020 FINAL  Final  C Difficile Quick Screen w PCR reflex     Status: None   Collection Time: 06/07/20  1:30 AM   Specimen: STOOL  Result Value Ref Range Status   C Diff antigen NEGATIVE NEGATIVE Final   C Diff toxin NEGATIVE NEGATIVE Final   C Diff interpretation No C. difficile detected.  Final    Comment: Performed at [facility], [place] [address], [place], [place] [address]      Radiology Studies: No results found.   LOS: 11 days   Antonieta Pert, MD [facility]  06/16/2020, 12:44 PM

## 2020-06-17 LAB — CBC WITH DIFFERENTIAL/PLATELET
Abs Immature Granulocytes: 0.03 10*3/uL (ref 0.00–0.07)
Basophils Absolute: 0 10*3/uL (ref 0.0–0.1)
Basophils Relative: 0 %
Eosinophils Absolute: 0 10*3/uL (ref 0.0–0.5)
Eosinophils Relative: 0 %
HCT: 25.6 % — ABNORMAL LOW (ref 39.0–52.0)
Hemoglobin: 8.3 g/dL — ABNORMAL LOW (ref 13.0–17.0)
Immature Granulocytes: 1 %
Lymphocytes Relative: 9 %
Lymphs Abs: 0.4 10*3/uL — ABNORMAL LOW (ref 0.7–4.0)
MCH: 31.1 pg (ref 26.0–34.0)
MCHC: 32.4 g/dL (ref 30.0–36.0)
MCV: 95.9 fL (ref 80.0–100.0)
Monocytes Absolute: 0.3 10*3/uL (ref 0.1–1.0)
Monocytes Relative: 8 %
Neutro Abs: 3.4 10*3/uL (ref 1.7–7.7)
Neutrophils Relative %: 82 %
Platelets: 133 10*3/uL — ABNORMAL LOW (ref 150–400)
RBC: 2.67 MIL/uL — ABNORMAL LOW (ref 4.22–5.81)
RDW: 17.3 % — ABNORMAL HIGH (ref 11.5–15.5)
WBC: 4.1 10*3/uL (ref 4.0–10.5)
nRBC: 0 % (ref 0.0–0.2)

## 2020-06-17 LAB — GLUCOSE, CAPILLARY
Glucose-Capillary: 175 mg/dL — ABNORMAL HIGH (ref 70–99)
Glucose-Capillary: 140 mg/dL — ABNORMAL HIGH (ref 70–99)
Glucose-Capillary: 100 mg/dL — ABNORMAL HIGH (ref 70–99)
Glucose-Capillary: 57 mg/dL — ABNORMAL LOW (ref 70–99)
Glucose-Capillary: 84 mg/dL (ref 70–99)

## 2020-06-17 MED ORDER — MAGNESIUM SULFATE 2 GM/50ML IV SOLN
2.0000 g | Freq: Once | INTRAVENOUS | Status: AC
  Administered 2020-06-17: 2 g via INTRAVENOUS

## 2020-06-17 MED ORDER — GLUCERNA SHAKE PO LIQD
237.0000 mL | Freq: Three times a day (TID) | ORAL | Status: DC
  Administered 2020-06-17 – 2020-06-20 (×5): 237 mL via ORAL
  Filled 2020-06-17 (×11): qty 237

## 2020-06-17 MED ORDER — POLYETHYLENE GLYCOL 3350 17 G PO PACK
17.0000 g | PACK | Freq: Every day | ORAL | Status: DC | PRN
  Filled 2020-06-17: qty 1

## 2020-06-17 MED ORDER — MAGNESIUM OXIDE 400 (241.3 MG) MG PO TABS
400.0000 mg | ORAL_TABLET | Freq: Two times a day (BID) | ORAL | Status: AC
  Administered 2020-06-17 – 2020-06-19 (×6): 400 mg via ORAL
  Filled 2020-06-17 (×6): qty 1

## 2020-06-17 MED ORDER — INSULIN GLARGINE 100 UNIT/ML ~~LOC~~ SOLN
7.0000 [IU] | Freq: Every day | SUBCUTANEOUS | Status: DC
  Administered 2020-06-18 – 2020-06-20 (×3): 7 [IU] via SUBCUTANEOUS
  Filled 2020-06-17 (×3): qty 0.07

## 2020-06-17 NOTE — Progress Notes (Signed)
PROGRESS NOTE    Tony Marshall  ZGY:[id number] DOB: 09-16-1941 DOA: 06/05/2020 PCP: Elayne Snare, MD   Chef Complaints: diarrhea/malaise  Brief Narrative: 79 year old with CLL on Rituxan, [place], diabetes, hypertension, hyperlipidemia admitted with diarrhea, malaise, mild DKA requiring insulin drip. Labs significant for pancytopenia, mild hyperbilirubinemia, electrolyte abnormalities. Transferred to ICU on 6/22 for hypotension, sepsis with elevated lactic acid. PCCM consulted for help with management. Had to be placed on pressors. He stabilized. He was weaned off of Levophed 6/25. He was stabilized and transferred to [facility] service.  Subjective:  Wife at the bedside.   Feels very weak and nauseous this am no BM despite stool softener Could not sit up on chair too long and was dizzy but not hypotensive. Appears deconditioned   Assessment & Plan:  Septic shock due to right lower extremity cellulitis in the setting of immunosuppression with CLL: Initially on vasopressors off since 6/25, also was on vancomycin and cefepime.  Suckers resolved.  Cellulitis nicely improved.  Still has erythema and swelling back of the car but receding slowly.  Keep on IV Ancef for now.  Will likely need at least 2 weeks of antibiotics given his immunosuppression status.  We will plan to change to p.o. Keflex upon discharge  Pancytopenia in the setting of CLL/CLL with immunosuppression: Initially leukopenic.  CBC as below overall stable.  Continue to monitor.  Recent Labs  Lab 06/15/20 0440 06/16/20 0358 06/17/20 0331  HGB 9.3* 9.1* 8.3*  HCT 28.6* 27.9* 25.6*  WBC 7.5 4.8 4.1  PLT 126* 124* 591*   Acute metabolic encephalopathy due to sepsis.  Alert awake oriented.  Stable.  Acute on chronic combined systolic and diastolic congestive heart failure.  Echo with EF 25 to 30% currently euvolemic.  Has mild swelling on the leg, stable respiratory status.  Per cardiology continue with carvedilol 12.5 twice  daily and if blood pressure above 160 persistently can increase to 25 twice daily, stop benazepril amlodipine doxazosin Actos due to CHF, now also started on Entresto.  Close follow-up with cardiology.   T2DM uncontrolled with A1c 7.7, uncontrolled hyperglycemia in 300 earlier, Lantus was increased to 10 units daily blood sugar improving.  Continue sliding scale and monitor Recent Labs  Lab 06/16/20 0724 06/16/20 1117 06/16/20 1638 06/16/20 2101 06/17/20 0742  GLUCAP 232* 307* 135* 172* 175*   Hypokalemia/hypophosphatemia/hypomagnesemia: Mag was low we will replete IV, potassium overall stable was repleted.  Recheck in the morning    Generalized weakness/debility: Patient appears weak frail.  Appears very deconditioned, continues to endorse weakness difficulty with standing and after extreme discussion patient and wife are agreeable to skilled nursing facility assessment by PT.  I have consulted TOC case management.    DVT prophylaxis: enoxaparin (LOVENOX) injection 40 mg Start: 06/13/20 1200 Code Status:FUll Family Communication: plan of care discussed with patient at bedside.  Status is: Inpatient  Remains inpatient appropriate because for ongoing management of patient's cellulitis, and will be looking into skilled nursing facility as he is agreeable for SNF.  He was reluctant and had refused  SNF until yesterday.  He is high risk for readmission and is weak and frail.  Dispo:  Patient From: Home  Planned Disposition:  SNF  Expected discharge date: 1-2 day  Medically stable for discharge: No  TOC consulted for SNF. remains on iv antibiotics. Nutrition: Diet Order            Diet heart healthy/carb modified Room service appropriate? No; Fluid consistency: Thin  Diet  effective now                 Nutrition Problem: Moderate Malnutrition Etiology: chronic illness, cancer and cancer related treatments Signs/Symptoms: mild fat depletion, moderate fat depletion, moderate muscle  depletion Interventions: MVI, Other (Comment) (Beneprotein and [employer]) Body mass index is 21.14 kg/m.  Consultants:see note  Procedures:see note Microbiology:see note  Medications: Scheduled Meds:  acetaminophen  650 mg Oral Once   carvedilol  12.5 mg Oral BID WC   Chlorhexidine Gluconate Cloth  6 each Topical Daily   enoxaparin (LOVENOX) injection  40 mg Subcutaneous Q24H   feeding supplement (GLUCERNA SHAKE)  237 mL Oral TID WC   folic acid  1 mg Oral Daily   insulin aspart  0-9 Units Subcutaneous TID WC   insulin glargine  10 Units Subcutaneous Daily   magnesium oxide  400 mg Oral BID   multivitamin with minerals  1 tablet Oral Daily   pantoprazole  40 mg Oral Daily   potassium & sodium phosphates  1 packet Oral TID WC & HS   protein supplement  12 g Oral TID WC   rosuvastatin  5 mg Oral QHS   sacubitril-valsartan  1 tablet Oral BID   senna  1 tablet Oral Daily   sodium chloride flush  10-40 mL Intracatheter Q12H   thiamine  100 mg Oral Daily   Continuous Infusions:  sodium chloride      ceFAZolin (ANCEF) IV 2 g (06/17/20 0512)   magnesium sulfate bolus IVPB      Antimicrobials: Anti-infectives (From admission, onward)   Start     Dose/Rate Route Frequency Ordered Stop   06/10/20 1230  ceFAZolin (ANCEF) IVPB 2g/100 mL premix     Discontinue     2 g 200 mL/hr over 30 Minutes Intravenous Every 8 hours 06/10/20 1205     06/07/20 0000  vancomycin (VANCOREADY) IVPB 500 mg/100 mL  Status:  Discontinued        500 mg 100 mL/hr over 60 Minutes Intravenous Every 12 hours 06/06/20 1316 06/10/20 1141   06/06/20 1300  ceFEPIme (MAXIPIME) 2 g in sodium chloride 0.9 % 100 mL IVPB  Status:  Discontinued        2 g 200 mL/hr over 30 Minutes Intravenous Every 12 hours 06/06/20 1209 06/10/20 1141   06/06/20 1300  vancomycin (VANCOCIN) IVPB 1000 mg/200 mL premix        1,000 mg 200 mL/hr over 60 Minutes Intravenous  Once 06/06/20 1209 06/06/20 1353        Objective: Vitals: Today's Vitals   06/17/20 0300 06/17/20 0500 06/17/20 0548 06/17/20 0903  BP:   137/61 (!) 154/83  Pulse:   69 83  Resp: 18 20    Temp:   98.8 F (37.1 C)   TempSrc:   Oral   SpO2:   96% 96%  Weight:   64 kg   Height:      PainSc:        Intake/Output Summary (Last 24 hours) at 06/17/2020 0949 Last data filed at 06/17/2020 0600 Gross per 24 hour  Intake 540 ml  Output 300 ml  Net 240 ml   Filed Weights   06/15/20 0500 06/16/20 0531 06/17/20 0548  Weight: 63.6 kg 64.2 kg 64 kg   Weight change: -0.2 kg   Intake/Output from previous day: 07/02 0701 - 07/03 0700 In: 690 [P.O.:390; IV Piggyback:300] Out: 300 [Urine:300] Intake/Output this shift: No intake/output data recorded.  Examination:  General  exam: AAOx3, frail, deconditioned, NAD, weak appearing. HEENT:Oral mucosa moist, Ear/Nose WNL grossly, dentition normal. Respiratory system: bilaterally c;ear,no wheezing or crackles,no use of accessory muscle Cardiovascular system: S1 & S2 +, No JVD,. Gastrointestinal system: Abdomen soft, NT,ND, BS+ Nervous System:Alert, awake, moving extremities and grossly nonfocal Extremities: No edema, distal peripheral pulses palpable.  Back of right lower leg with area of erythema tenderness but receding.  Cellulitis of back of the thigh has resolved Skin: No rashes,no icterus. MSK: Normal muscle bulk,tone, power   Data Reviewed: I have personally reviewed following labs and imaging studies CBC: Recent Labs  Lab 06/13/20 0402 06/14/20 0307 06/15/20 0440 06/16/20 0358 06/17/20 0331  WBC 10.4 8.3 7.5 4.8 4.1  NEUTROABS 8.4* 6.4 6.0 3.6 3.4  HGB 9.1* 9.3* 9.3* 9.1* 8.3*  HCT 28.1* 27.7* 28.6* 27.9* 25.6*  MCV 93.7 93.3 94.7 95.2 95.9  PLT 98* 126* 126* 124* 947*   Basic Metabolic Panel: Recent Labs  Lab 06/11/20 0348 06/11/20 0348 06/11/20 1830 06/12/20 0410 06/13/20 0402 06/14/20 0307 06/15/20 0440 06/16/20 0358  NA 136   < >  --  136 138  136 134* 135  K 3.7   < >  --  3.6 3.4* 3.8 3.3* 3.3*  CL 110   < >  --  109 99 106 102 100  CO2 20*   < >  --  19* 29 23 22 24   GLUCOSE 142*   < >  --  223* 99 198* 199* 197*  BUN 26*   < >  --  23 8 15 13 12   CREATININE 0.82   < >  --  0.80 0.88 0.57* 0.52* 0.51*  CALCIUM 7.4*   < >  --  7.4* 8.1* 7.4* 7.4* 7.1*  MG  --   --   --   --   --   --   --  1.3*  PHOS <1.0*  --  1.9* 1.7* 3.4 2.3*  --   --    < > = values in this interval not displayed.   GFR: Estimated Creatinine Clearance: 71.1 mL/min (A) (by C-G formula based on SCr of 0.51 mg/dL (L)). Liver Function Tests: Recent Labs  Lab 06/12/20 0410 06/13/20 0402 06/14/20 0307 06/15/20 0440 06/16/20 0358  AST 16 11* 13* 12* 13*  ALT 27 13 11 8 8   ALKPHOS 80 33* 81 79 72  BILITOT 1.1 0.8 0.9 1.1 1.2  PROT 4.1* 5.0* 3.6* 4.2* 3.9*  ALBUMIN 2.0* 2.7* 1.8* 2.0* 1.9*   No results for input(s): LIPASE, AMYLASE in the last 168 hours. No results for input(s): AMMONIA in the last 168 hours. Coagulation Profile: No results for input(s): INR, PROTIME in the last 168 hours. Cardiac Enzymes: Recent Labs  Lab 06/10/20 1128  CKTOTAL 87  CKMB 22.2*   BNP (last 3 results) No results for input(s): PROBNP in the last 8760 hours. HbA1C: No results for input(s): HGBA1C in the last 72 hours. CBG: Recent Labs  Lab 06/16/20 0724 06/16/20 1117 06/16/20 1638 06/16/20 2101 06/17/20 0742  GLUCAP 232* 307* 135* 172* 175*   Lipid Profile: No results for input(s): CHOL, HDL, LDLCALC, TRIG, CHOLHDL, LDLDIRECT in the last 72 hours. Thyroid Function Tests: No results for input(s): TSH, T4TOTAL, FREET4, T3FREE, THYROIDAB in the last 72 hours. Anemia Panel: No results for input(s): VITAMINB12, FOLATE, FERRITIN, TIBC, IRON, RETICCTPCT in the last 72 hours. Sepsis Labs: Recent Labs  Lab 06/10/20 1128 06/11/20 0348  LATICACIDVEN 1.2 1.1  No results found for this or any previous visit (from the past 240 hour(s)).    Radiology  Studies: No results found.   LOS: 12 days   Antonieta Pert, MD [facility]  06/17/2020, 9:49 AM

## 2020-06-17 NOTE — TOC Progression Note (Signed)
Transition of Care (TOC) - Progression Note    Patient Details  Name: Tony Marshall MRN: [id number] Date of Birth: 01/02/1941  Transition of Care (TOC) CM/SW Contact  , , LCSW Phone Number: 06/17/2020, 4:39 PM  Clinical Narrative:   Met with pt and wife this afternoon and both have now agreed they would like to pursue SNF given his assistance needs.  Will begin SNF bed search.    Expected Discharge Plan: Home w Home Health Services Barriers to Discharge: Continued Medical Work up  Expected Discharge Plan and Services Expected Discharge Plan: Home w Home Health Services   Discharge Planning Services: CM Consult Post Acute Care Choice: Home Health Living arrangements for the past 2 months: Single Family Home                 DME Arranged: N/A DME Agency: NA       HH Arranged: PT, OT HH Agency: [facility] Date HH Agency Contacted: 06/13/20 Time HH Agency Contacted: 1120 Representative spoke with at HH Agency: Brittany   Social Determinants of Health (SDOH) Interventions    Readmission Risk Interventions No flowsheet data found.  

## 2020-06-17 NOTE — Progress Notes (Signed)
Physical Therapy Treatment Patient Details Name: Tony Marshall MRN: [id number] DOB: Apr 18, 1941 Today's Date: 06/17/2020    History of Present Illness 79 year old with CLL on Rituxan, [place], diabetes, hypertension, hyperlipidemia admitted with diarrhea, malaise, mild DKA requiring insulin drip. Labs significant for pancytopenia, mild hyperbilirubinemia, electrolyte abnormalities.  Transferred to ICU on 6/22 for hypotension, sepsis with elevated lactic acid.  Pt also with RLE cellulitis and sepsis - onset 6/23 following RLE leg trauma in ER. DVT negative    PT Comments    Pt demonstrating good progress and able to progress to gait today.  Required min A for transfers and gait with cues for safe technique.  Additionally, required cues for slow, controlled full ROM with exercises.  Cont POC. Pt is now agreeable to SNF at d/c.     Follow Up Recommendations  SNF;Supervision/Assistance - 24 hour     Equipment Recommendations  Rolling walker with 5" wheels;3in1 (PT)    Recommendations for Other Services       Precautions / Restrictions Precautions Precautions: Fall    Mobility  Bed Mobility   Bed Mobility: Supine to Sit     Supine to sit: Min assist     General bed mobility comments: cues for sequencing  Transfers Overall transfer level: Needs assistance Equipment used: Rolling walker (2 wheeled) Transfers: Sit to/from Stand Sit to Stand: Min assist;From elevated surface         General transfer comment: Had assist of 2 for safety due to prior status but was able to do with min A; cues for safe hand placement and safe sitting technique; sit to stand x 2  Ambulation/Gait Ambulation/Gait assistance: Min assist Gait Distance (Feet): 20 Feet Assistive device: Rolling walker (2 wheeled) Gait Pattern/deviations: Decreased stride length;Decreased weight shift to right;Decreased stance time - right Gait velocity: decreased   General Gait Details: mild unsteadiness; chair  follow for safety; cues for posture and RW   Stairs             Wheelchair Mobility    Modified Rankin (Stroke Patients Only)       Balance Overall balance assessment: Needs assistance Sitting-balance support: No upper extremity supported;Feet supported;Feet unsupported Sitting balance-Leahy Scale: Good     Standing balance support: Bilateral upper extremity supported Standing balance-Leahy Scale: Poor Standing balance comment: required RW                            Cognition Arousal/Alertness: Awake/alert Behavior During Therapy: WFL for tasks assessed/performed Overall Cognitive Status: Within Functional Limits for tasks assessed                                        Exercises General Exercises - Lower Extremity Ankle Circles/Pumps: AROM;Both;10 reps;Seated Long Arc Quad: AROM;Right;Seated;10 reps Hip Flexion/Marching: AROM;Both;10 reps;Seated    General Comments General comments (skin integrity, edema, etc.): VSS; reports has been performing bed ther ex with assist on his own      Pertinent Vitals/Pain Pain Assessment: 0-10 Pain Score: 3  Pain Location: R LE with weight bearing Pain Descriptors / Indicators: Sore Pain Intervention(s): Limited activity within patient's tolerance;Monitored during session    Home Living                      Prior Function  PT Goals (current goals can now be found in the care plan section) Acute Rehab PT Goals Patient Stated Goal: agreeable to rehab; wants to get stronger PT Goal Formulation: With patient/family Time For Goal Achievement: 06/23/20 Potential to Achieve Goals: Good Progress towards PT goals: Progressing toward goals    Frequency    Min 2X/week      PT Plan Current plan remains appropriate    Co-evaluation              AM-PAC PT "6 Clicks" Mobility   Outcome Measure  Help needed turning from your back to your side while in a flat bed  without using bedrails?: A Little Help needed moving from lying on your back to sitting on the side of a flat bed without using bedrails?: A Little Help needed moving to and from a bed to a chair (including a wheelchair)?: A Little Help needed standing up from a chair using your arms (e.g., wheelchair or bedside chair)?: A Little Help needed to walk in hospital room?: A Little Help needed climbing 3-5 steps with a railing? : A Lot 6 Click Score: 17    End of Session Equipment Utilized During Treatment: Gait belt Activity Tolerance: Patient tolerated treatment well Patient left: with call bell/phone within reach;in bed;with bed alarm set;with family/visitor present Nurse Communication: Mobility status PT Visit Diagnosis: Other abnormalities of gait and mobility (R26.89);Pain Pain - Right/Left: Right Pain - part of body: Leg     Time: 1325-1345 PT Time Calculation (min) (ACUTE ONLY): 20 min  Charges:  $Gait Training: 8-22 mins                     [name], PT Acute Rehab Services Pager [phone] [name] 06/17/2020, 2:42 PM

## 2020-06-17 NOTE — Progress Notes (Addendum)
RN was notified by [place] NT, patient's blood sugar check was 57 mg/dl. Patient is awake, AOX4, denies dizziness or distress. Given 2 cups of Orange juice, patient able to get them down without aspiration. BG rechecked at 2056H, 84mg /dl. MD made aware. Will continue to monitor.

## 2020-06-17 NOTE — NC FL2 (Signed)
[place] LEVEL OF CARE SCREENING TOOL     IDENTIFICATION  Patient Name: Tony Marshall Birthdate: 11/05/1941 Sex: male Admission Date (Current Location): 06/05/2020  [facility] and [place] Number:  Herbalist and Address:  [facility],  [place] [address] [place]      Provider Number: [id number]  Attending Physician Name and Address:  Tony Pert, MD  Relative Name and Phone Number:  wife, Tony Marshall    Current Level of Care: Hospital Recommended Level of Care: [place] Prior Approval Number:    Date Approved/Denied:   PASRR Number: [id number]  Discharge Plan: SNF    Current Diagnoses: Patient Active Problem List   Diagnosis Date Noted  . Malnutrition of moderate degree 06/09/2020  . Hyperglycemia due to diabetes mellitus ([place])   . Leukopenia   . Acute combined systolic and diastolic heart failure ([place])   . Cardiogenic shock ([place])   . Altered mental status   . Failure to thrive in adult   . Symptomatic anemia   . Acute respiratory failure ([place])   . Increased anion gap metabolic acidosis 36/14/4315  . Goals of care, counseling/discussion 03/28/2020  . Encounter for antineoplastic chemotherapy 03/28/2020  . GERD (gastroesophageal reflux disease) 05/13/2014  . History of colonic polyps 05/13/2014  . Hiatal hernia 05/13/2014  . CLL (chronic lymphocytic leukemia) ([place]) 10/25/2013  . ED (erectile dysfunction) 10/20/2013  . Lymphocytosis 09/23/2013  . Essential hypertension 07/21/2013  . Pure hypercholesterolemia 07/21/2013  . Diabetes mellitus ([place]) 07/20/2013    Orientation RESPIRATION BLADDER Height & Weight     Self, Time, Situation, Place  Normal Continent Weight: 141 lb 1.5 oz (64 kg) Height:  5' 8.5" (174 cm)  BEHAVIORAL SYMPTOMS/MOOD NEUROLOGICAL BOWEL NUTRITION STATUS      Continent    AMBULATORY STATUS COMMUNICATION OF NEEDS Skin   Limited Assist Verbally Normal                        Personal Care Assistance Level of Assistance  Bathing, Dressing Bathing Assistance: Limited assistance   Dressing Assistance: Limited assistance     Functional Limitations Info             SPECIAL CARE FACTORS FREQUENCY  PT (By licensed PT), OT (By licensed OT)     PT Frequency: 5x/wk OT Frequency: 5x/wk            Contractures Contractures Info: Not present    Additional Factors Info  Code Status, Allergies Code Status Info: Full Allergies Info: see MAR           Current Medications (06/17/2020):  This is the current hospital active medication list Current Facility-Administered Medications  Medication Dose Route Frequency Provider Last Rate Last Admin  . 0.9 %  sodium chloride infusion  250 mL Intravenous Continuous Anders Simmonds, MD      . acetaminophen (TYLENOL) tablet 650 mg  650 mg Oral Q6H PRN Cherene Altes, MD   650 mg at 06/17/20 0854  . acetaminophen (TYLENOL) tablet 650 mg  650 mg Oral Once Curt Bears, MD      . ALPRAZolam Duanne Moron) tablet 0.5 mg  0.5 mg Oral QHS PRN Bonnielee Haff, MD   0.5 mg at 06/16/20 2308  . carvedilol (COREG) tablet 12.5 mg  12.5 mg Oral BID WC Buford Dresser, MD   12.5 mg at 06/17/20 0855  . ceFAZolin (ANCEF) IVPB 2g/100 mL premix  2 g Intravenous  Q8H Brand Males, MD 200 mL/hr at 06/17/20 1426 2 g at 06/17/20 1426  . Chlorhexidine Gluconate Cloth 2 % PADS 6 each  6 each Topical Daily Nita Sells, MD   6 each at 06/17/20 0857  . dextrose 50 % solution 0-50 mL  0-50 mL Intravenous PRN Belaya, Maria A, PA-C      . enoxaparin (LOVENOX) injection 40 mg  40 mg Subcutaneous Q24H Bonnielee Haff, MD   40 mg at 06/17/20 1224  . feeding supplement (GLUCERNA SHAKE) (GLUCERNA SHAKE) liquid 237 mL  237 mL Oral TID WC Kc, Ramesh, MD   237 mL at 06/17/20 1224  . folic acid (FOLVITE) tablet 1 mg  1 mg Oral Daily Bonnielee Haff, MD   1 mg at 06/17/20 0854  . insulin aspart (novoLOG) injection 0-9 Units  0-9  Units Subcutaneous TID WC Nita Sells, MD   1 Units at 06/17/20 1224  . insulin glargine (LANTUS) injection 10 Units  10 Units Subcutaneous Daily Tony Pert, MD   10 Units at 06/17/20 1035  . lip balm (CARMEX) ointment   Topical PRN Mannam, Praveen, MD      . magnesium oxide (MAG-OX) tablet 400 mg  400 mg Oral BID Kc, Ramesh, MD   400 mg at 06/17/20 1035  . melatonin tablet 6 mg  6 mg Oral QHS PRN Lang Snow, FNP   6 mg at 06/16/20 2200  . multivitamin with minerals tablet 1 tablet  1 tablet Oral Daily Nita Sells, MD   1 tablet at 06/17/20 0854  . naloxone (NARCAN) injection 0.4 mg  0.4 mg Intravenous PRN Nita Sells, MD   0.4 mg at 06/06/20 1732  . ondansetron (ZOFRAN) injection 4 mg  4 mg Intravenous Q6H PRN Tony Pert, MD   4 mg at 06/16/20 1231  . oxymetazoline (AFRIN) 0.05 % nasal spray 1 spray  1 spray Each Nare BID PRN Cherene Altes, MD      . pantoprazole (PROTONIX) EC tablet 40 mg  40 mg Oral Daily Bonnielee Haff, MD   40 mg at 06/17/20 0854  . polyethylene glycol (MIRALAX / GLYCOLAX) packet 17 g  17 g Oral Daily PRN Kc, Ramesh, MD      . potassium & sodium phosphates (PHOS-NAK) 280-160-250 MG packet 1 packet  1 packet Oral TID WC & HS Barb Merino, MD   1 packet at 06/17/20 0855  . protein supplement (RESOURCE BENEPROTEIN) powder packet 12 g  12 g Oral TID WC Nita Sells, MD   12 g at 06/17/20 0855  . rosuvastatin (CRESTOR) tablet 5 mg  5 mg Oral QHS Kroeger, Krista M., PA-C   5 mg at 06/16/20 2032  . sacubitril-valsartan (ENTRESTO) 24-26 mg per tablet  1 tablet Oral BID Buford Dresser, MD   1 tablet at 06/17/20 0854  . senna (SENOKOT) tablet 8.6 mg  1 tablet Oral Daily Bonnielee Haff, MD   8.6 mg at 06/17/20 0855  . sodium chloride flush (NS) 0.9 % injection 10-40 mL  10-40 mL Intracatheter Q12H Mannam, Praveen, MD   10 mL at 06/14/20 0913  . sodium chloride flush (NS) 0.9 % injection 10-40 mL  10-40 mL Intracatheter PRN Mannam,  Praveen, MD   10 mL at 06/16/20 0404  . thiamine tablet 100 mg  100 mg Oral Daily Bonnielee Haff, MD   100 mg at 06/17/20 0854  . traMADol (ULTRAM) tablet 50 mg  50 mg Oral Q6H PRN Barb Merino, MD   50 mg at  06/17/20 1418     Discharge Medications: Please see discharge summary for a list of discharge medications.  Relevant Imaging Results:  Relevant Lab Results:   Additional Information SS# [id number];  also, plan to change to po abx at d/c.  Yasemin Rabon, LCSW

## 2020-06-18 LAB — CBC WITH DIFFERENTIAL/PLATELET
Abs Immature Granulocytes: 0.02 10*3/uL (ref 0.00–0.07)
Basophils Absolute: 0 10*3/uL (ref 0.0–0.1)
Basophils Relative: 0 %
Eosinophils Absolute: 0 10*3/uL (ref 0.0–0.5)
Eosinophils Relative: 0 %
HCT: 25.9 % — ABNORMAL LOW (ref 39.0–52.0)
Hemoglobin: 8.4 g/dL — ABNORMAL LOW (ref 13.0–17.0)
Immature Granulocytes: 1 %
Lymphocytes Relative: 12 %
Lymphs Abs: 0.4 10*3/uL — ABNORMAL LOW (ref 0.7–4.0)
MCH: 30.5 pg (ref 26.0–34.0)
MCHC: 32.4 g/dL (ref 30.0–36.0)
MCV: 94.2 fL (ref 80.0–100.0)
Monocytes Absolute: 0.3 10*3/uL (ref 0.1–1.0)
Monocytes Relative: 7 %
Neutro Abs: 2.9 10*3/uL (ref 1.7–7.7)
Neutrophils Relative %: 80 %
Platelets: 136 10*3/uL — ABNORMAL LOW (ref 150–400)
RBC: 2.75 MIL/uL — ABNORMAL LOW (ref 4.22–5.81)
RDW: 17.2 % — ABNORMAL HIGH (ref 11.5–15.5)
WBC: 3.6 10*3/uL — ABNORMAL LOW (ref 4.0–10.5)
nRBC: 0 % (ref 0.0–0.2)

## 2020-06-18 LAB — GLUCOSE, CAPILLARY
Glucose-Capillary: 162 mg/dL — ABNORMAL HIGH (ref 70–99)
Glucose-Capillary: 170 mg/dL — ABNORMAL HIGH (ref 70–99)
Glucose-Capillary: 213 mg/dL — ABNORMAL HIGH (ref 70–99)
Glucose-Capillary: 52 mg/dL — ABNORMAL LOW (ref 70–99)
Glucose-Capillary: 85 mg/dL (ref 70–99)
Glucose-Capillary: 97 mg/dL (ref 70–99)

## 2020-06-18 LAB — BASIC METABOLIC PANEL
Anion gap: 10 (ref 5–15)
BUN: 12 mg/dL (ref 8–23)
CO2: 28 mmol/L (ref 22–32)
Calcium: 7 mg/dL — ABNORMAL LOW (ref 8.9–10.3)
Chloride: 96 mmol/L — ABNORMAL LOW (ref 98–111)
Creatinine, Ser: 0.45 mg/dL — ABNORMAL LOW (ref 0.61–1.24)
GFR calc Af Amer: >60 mL/min (ref >60)
GFR calc non Af Amer: >60 mL/min (ref >60)
Glucose, Bld: 177 mg/dL — ABNORMAL HIGH (ref 70–99)
Potassium: 3 mmol/L — ABNORMAL LOW (ref 3.5–5.1)
Sodium: 134 mmol/L — ABNORMAL LOW (ref 135–145)

## 2020-06-18 LAB — MAGNESIUM: Magnesium: 1.6 mg/dL — ABNORMAL LOW (ref 1.7–2.4)

## 2020-06-18 MED ORDER — POTASSIUM CHLORIDE CRYS ER 20 MEQ PO TBCR
40.0000 meq | EXTENDED_RELEASE_TABLET | Freq: Once | ORAL | Status: AC
  Administered 2020-06-18: 40 meq via ORAL
  Filled 2020-06-18: qty 2

## 2020-06-18 NOTE — Progress Notes (Signed)
PROGRESS NOTE    Tony Marshall  XIP:[id number] DOB: 05/21/1941 DOA: 06/05/2020 PCP: Elayne Snare, MD   Chef Complaints: Diarrhea/malaise  Brief Narrative: 79 year old with CLL on Rituxan, [place], diabetes, hypertension, hyperlipidemia admitted with diarrhea, malaise, mild DKA requiring insulin drip. Labs significant for pancytopenia, mild hyperbilirubinemia, electrolyte abnormalities. Transferred to ICU on 6/22 for hypotension, sepsis with elevated lactic acid. PCCM consulted for help with management. Had to be placed on pressors. He stabilized. He was weaned off of Levophed 6/25. He was stabilized and transferred to [facility] service.  Subjective:  Overnight hypoglycemic lantus cut down from 10 to 7 units.  Wife at the bedside stating redness and swelling is improving further.   Assessment & Plan:  Septic shock due to right lower extremity cellulitis in the setting of immunosuppression with CLL: Initially on vasopressors off since 6/25, also was on vancomycin and cefepime. Septic shock resolved.  Overall cellulitis improved on the back of her thigh but has erythema on back of calf-  Cont iv Ancef  While here and plan to change to po on d/c.  Pancytopenia in the setting of CLL/CLL with immunosuppression: Initially leukopenic.  CBC overall stable as below.   Recent Labs  Lab 06/16/20 0358 06/17/20 0331 06/18/20 0355  HGB 9.1* 8.3* 8.4*  HCT 27.9* 25.6* 25.9*  WBC 4.8 4.1 3.6*  PLT 124* 133* 673*   Acute metabolic encephalopathy due to sepsis.  Patient is alert awake but deconditioned.  Continue supportive care.  Acute on chronic combined systolic and diastolic congestive heart failure.  Echo with EF 25 to 30% currently euvolemic.  Has mild swelling on the leg, stable respiratory status.  Per cardiology continue with carvedilol 12.5 twice daily and if blood pressure above 160 persistently can increase to 25 twice daily, stop benazepril amlodipine doxazosin Actos due to CHF, now also  started on Entresto.  Close follow-up with cardiology.   T2DM uncontrolled with A1c 7.7.  Had uncontrolled hyperglycemia with blood sugar in 300 Lantus was increased from 7 to 10 units.  At home on Metformin and Actos and sliding scale with Tresiba 12 units.  Overnight hypoglycemic Lantus dose cut down to 7 units.  Monitor closely on sliding scale.  Monitor. Recent Labs  Lab 06/17/20 1624 06/17/20 2020 06/17/20 2057 06/18/20 0025 06/18/20 0739  GLUCAP 100* 57* 84 162* 213*   Hypokalemia/hypophosphatemia/hypomagnesemia: K and mag still remains low we will continue on magnesium oxide, replete with potassium chloride p.o.  Monitor intermittently.    Generalized weakness/debility: Patient appears weak frail.  Very deconditioned weak and frail.  Initially reluctant to go to skilled nursing facility however on 7/3 he finally agreed and [facility] consulted and looking into SNF. DVT prophylaxis: enoxaparin (LOVENOX) injection 40 mg Start: 06/13/20 1200 Code Status:FUll Family Communication: plan of care discussed with patient at bedside.  Status is: Inpatient  Remains inpatient appropriate because for ongoing management of patient's cellulitis, and will be looking into skilled nursing facility as he is agreeable for SNF.  He was reluctant and had refused  SNF until yesterday.  He is high risk for readmission and is weak and frail.  Dispo: Patient From: Home  Planned Disposition:  SNF  Expected discharge date: 1-2 day  Medically stable for discharge: No  TOC consulted for SNF. remains on iv antibiotics. Nutrition: Diet Order            Diet heart healthy/carb modified Room service appropriate? No; Fluid consistency: Thin  Diet effective now  Nutrition Problem: Moderate Malnutrition Etiology: chronic illness, cancer and cancer related treatments Signs/Symptoms: mild fat depletion, moderate fat depletion, moderate muscle depletion Interventions: MVI, Other (Comment) (Beneprotein  and [employer]) Body mass index is 21.14 kg/m.  Consultants:see note  Procedures:see note Microbiology:see note  Medications: Scheduled Meds: . acetaminophen  650 mg Oral Once  . carvedilol  12.5 mg Oral BID WC  . Chlorhexidine Gluconate Cloth  6 each Topical Daily  . enoxaparin (LOVENOX) injection  40 mg Subcutaneous Q24H  . feeding supplement (GLUCERNA SHAKE)  237 mL Oral TID WC  . folic acid  1 mg Oral Daily  . insulin aspart  0-9 Units Subcutaneous TID WC  . insulin glargine  7 Units Subcutaneous Daily  . magnesium oxide  400 mg Oral BID  . multivitamin with minerals  1 tablet Oral Daily  . pantoprazole  40 mg Oral Daily  . potassium & sodium phosphates  1 packet Oral TID WC & HS  . protein supplement  12 g Oral TID WC  . rosuvastatin  5 mg Oral QHS  . sacubitril-valsartan  1 tablet Oral BID  . senna  1 tablet Oral Daily  . sodium chloride flush  10-40 mL Intracatheter Q12H  . thiamine  100 mg Oral Daily   Continuous Infusions: . sodium chloride    .  ceFAZolin (ANCEF) IV 2 g (06/18/20 0504)    Antimicrobials: Anti-infectives (From admission, onward)   Start     Dose/Rate Route Frequency Ordered Stop   06/10/20 1230  ceFAZolin (ANCEF) IVPB 2g/100 mL premix     Discontinue     2 g 200 mL/hr over 30 Minutes Intravenous Every 8 hours 06/10/20 1205     06/07/20 0000  vancomycin (VANCOREADY) IVPB 500 mg/100 mL  Status:  Discontinued        500 mg 100 mL/hr over 60 Minutes Intravenous Every 12 hours 06/06/20 1316 06/10/20 1141   06/06/20 1300  ceFEPIme (MAXIPIME) 2 g in sodium chloride 0.9 % 100 mL IVPB  Status:  Discontinued        2 g 200 mL/hr over 30 Minutes Intravenous Every 12 hours 06/06/20 1209 06/10/20 1141   06/06/20 1300  vancomycin (VANCOCIN) IVPB 1000 mg/200 mL premix        1,000 mg 200 mL/hr over 60 Minutes Intravenous  Once 06/06/20 1209 06/06/20 1353       Objective: Vitals: Today's Vitals   06/17/20 1806 06/17/20 2022 06/17/20 2232 06/18/20 0640   BP:  (!) 146/87  (!) 141/77  Pulse:  80  83  Resp:  17  18  Temp:  98.5 F (36.9 C)  98.4 F (36.9 C)  TempSrc:  Oral  Oral  SpO2:  99%  100%  Weight:      Height:      PainSc: 4  4  2       Intake/Output Summary (Last 24 hours) at 06/18/2020 1052 Last data filed at 06/18/2020 0900 Gross per 24 hour  Intake 860 ml  Output 900 ml  Net -40 ml   Filed Weights   06/15/20 0500 06/16/20 0531 06/17/20 0548  Weight: 63.6 kg 64.2 kg 64 kg   Weight change:    Intake/Output from previous day: 07/03 0701 - 07/04 0700 In: 640 [P.O.:240; IV Piggyback:400] Out: 400 [Urine:400] Intake/Output this shift: Total I/O In: 220 [P.O.:120; IV Piggyback:100] Out: 500 [Urine:500]  Examination:  General exam: AAO , NAD, weak appearing. HEENT:Oral mucosa moist, Ear/Nose WNL grossly, dentition normal. Respiratory  system: bilaterally clear,no wheezing or crackles,no use of accessory muscle Cardiovascular system: S1 & S2 +, No JVD,. Gastrointestinal system: Abdomen soft, NT,ND, BS+ Nervous System:Alert, awake, moving extremities and grossly nonfocal Extremities: Mild edema in the lower extremities, right calf with tenderness redness and receding. Skin: No rashes,no icterus. MSK: Normal muscle bulk,tone, power  Data Reviewed: I have personally reviewed following labs and imaging studies CBC: Recent Labs  Lab 06/14/20 0307 06/15/20 0440 06/16/20 0358 06/17/20 0331 06/18/20 0355  WBC 8.3 7.5 4.8 4.1 3.6*  NEUTROABS 6.4 6.0 3.6 3.4 2.9  HGB 9.3* 9.3* 9.1* 8.3* 8.4*  HCT 27.7* 28.6* 27.9* 25.6* 25.9*  MCV 93.3 94.7 95.2 95.9 94.2  PLT 126* 126* 124* 133* 403*   Basic Metabolic Panel: Recent Labs  Lab 06/11/20 1830 06/12/20 0410 06/12/20 0410 06/13/20 0402 06/14/20 0307 06/15/20 0440 06/16/20 0358 06/18/20 0355  NA  --  136   < > 138 136 134* 135 134*  K  --  3.6   < > 3.4* 3.8 3.3* 3.3* 3.0*  CL  --  109   < > 99 106 102 100 96*  CO2  --  19*   < > 29 23 22 24 28   GLUCOSE  --   223*   < > 99 198* 199* 197* 177*  BUN  --  23   < > 8 15 13 12 12   CREATININE  --  0.80   < > 0.88 0.57* 0.52* 0.51* 0.45*  CALCIUM  --  7.4*   < > 8.1* 7.4* 7.4* 7.1* 7.0*  MG  --   --   --   --   --   --  1.3* 1.6*  PHOS 1.9* 1.7*  --  3.4 2.3*  --   --   --    < > = values in this interval not displayed.   GFR: Estimated Creatinine Clearance: 71.1 mL/min (A) (by C-G formula based on SCr of 0.45 mg/dL (L)). Liver Function Tests: Recent Labs  Lab 06/12/20 0410 06/13/20 0402 06/14/20 0307 06/15/20 0440 06/16/20 0358  AST 16 11* 13* 12* 13*  ALT 27 13 11 8 8   ALKPHOS 80 33* 81 79 72  BILITOT 1.1 0.8 0.9 1.1 1.2  PROT 4.1* 5.0* 3.6* 4.2* 3.9*  ALBUMIN 2.0* 2.7* 1.8* 2.0* 1.9*   No results for input(s): LIPASE, AMYLASE in the last 168 hours. No results for input(s): AMMONIA in the last 168 hours. Coagulation Profile: No results for input(s): INR, PROTIME in the last 168 hours. Cardiac Enzymes: No results for input(s): CKTOTAL, CKMB, CKMBINDEX, TROPONINI in the last 168 hours. BNP (last 3 results) No results for input(s): PROBNP in the last 8760 hours. HbA1C: No results for input(s): HGBA1C in the last 72 hours. CBG: Recent Labs  Lab 06/17/20 1624 06/17/20 2020 06/17/20 2057 06/18/20 0025 06/18/20 0739  GLUCAP 100* 57* 84 162* 213*   Lipid Profile: No results for input(s): CHOL, HDL, LDLCALC, TRIG, CHOLHDL, LDLDIRECT in the last 72 hours. Thyroid Function Tests: No results for input(s): TSH, T4TOTAL, FREET4, T3FREE, THYROIDAB in the last 72 hours. Anemia Panel: No results for input(s): VITAMINB12, FOLATE, FERRITIN, TIBC, IRON, RETICCTPCT in the last 72 hours. Sepsis Labs: No results for input(s): PROCALCITON, LATICACIDVEN in the last 168 hours.  No results found for this or any previous visit (from the past 240 hour(s)).    Radiology Studies: No results found.   LOS: 13 days   Antonieta Pert, MD [facility]  06/18/2020,  10:52 AM

## 2020-06-19 LAB — CBC WITH DIFFERENTIAL/PLATELET
Abs Immature Granulocytes: 0.01 10*3/uL (ref 0.00–0.07)
Basophils Absolute: 0 10*3/uL (ref 0.0–0.1)
Basophils Relative: 0 %
Eosinophils Absolute: 0 10*3/uL (ref 0.0–0.5)
Eosinophils Relative: 0 %
HCT: 26.8 % — ABNORMAL LOW (ref 39.0–52.0)
Hemoglobin: 8.6 g/dL — ABNORMAL LOW (ref 13.0–17.0)
Immature Granulocytes: 0 %
Lymphocytes Relative: 15 %
Lymphs Abs: 0.5 10*3/uL — ABNORMAL LOW (ref 0.7–4.0)
MCH: 30.7 pg (ref 26.0–34.0)
MCHC: 32.1 g/dL (ref 30.0–36.0)
MCV: 95.7 fL (ref 80.0–100.0)
Monocytes Absolute: 0.2 10*3/uL (ref 0.1–1.0)
Monocytes Relative: 6 %
Neutro Abs: 2.5 10*3/uL (ref 1.7–7.7)
Neutrophils Relative %: 79 %
Platelets: 132 10*3/uL — ABNORMAL LOW (ref 150–400)
RBC: 2.8 MIL/uL — ABNORMAL LOW (ref 4.22–5.81)
RDW: 17.1 % — ABNORMAL HIGH (ref 11.5–15.5)
WBC: 3.1 10*3/uL — ABNORMAL LOW (ref 4.0–10.5)
nRBC: 0 % (ref 0.0–0.2)

## 2020-06-19 LAB — GLUCOSE, CAPILLARY
Glucose-Capillary: 184 mg/dL — ABNORMAL HIGH (ref 70–99)
Glucose-Capillary: 227 mg/dL — ABNORMAL HIGH (ref 70–99)
Glucose-Capillary: 180 mg/dL — ABNORMAL HIGH (ref 70–99)
Glucose-Capillary: 136 mg/dL — ABNORMAL HIGH (ref 70–99)
Glucose-Capillary: 178 mg/dL — ABNORMAL HIGH (ref 70–99)

## 2020-06-19 MED ORDER — SIMETHICONE 80 MG PO CHEW
80.0000 mg | CHEWABLE_TABLET | Freq: Four times a day (QID) | ORAL | Status: DC | PRN
  Administered 2020-06-19: 80 mg via ORAL
  Filled 2020-06-19: qty 1

## 2020-06-19 NOTE — Care Management Important Message (Signed)
Important Message  Patient Details IM Letter given to [name] RN Case Manager to present to the Patient Name: Tony Marshall MRN: [id number] Date of Birth: [birth year]   Medicare Important Message Given:  Yes     Kerin Salen 06/19/2020, 10:57 AM

## 2020-06-19 NOTE — Progress Notes (Signed)
PROGRESS NOTE    Tony Marshall  OFB:[id number] DOB: [birth year] DOA: 06/05/2020 PCP: Elayne Snare, MD   Chef Complaints: Diarrhea malaise  Brief Narrative: 79 year old with CLL on Rituxan, [place], diabetes, hypertension, hyperlipidemia admitted with diarrhea, malaise, mild DKA requiring insulin drip. Labs significant for pancytopenia, mild hyperbilirubinemia, electrolyte abnormalities. Transferred to ICU on 6/22 for hypotension, sepsis with elevated lactic acid. PCCM consulted for help with management. Had to be placed on pressors. He stabilized. He was weaned off of Levophed 6/25. He was stabilized and transferred to [facility] service. Patient received IV Ancef with improvement in his cellulitis.  Overall patient is hemodynamically stable.  He remains deconditioned and weak PT assisted to skilled nursing facility which he was refusing initially and subsequently agreed given significant deconditioning and difficulty even to stand. Patient is medically stable awaiting on placement  Subjective: Afebrile overnight.Blood sugar stable. Redness pain much better on the right leg.   Assessment & Plan:  Septic shock due to right lower extremity cellulitis in the setting of immunosuppression with CLL: Initially on vasopressors off since 6/25, also was on vancomycin and cefepime. Septic shock resolved.  Mild swelling redness present on the back of right lower leg but improved on the thigh.  Continue on IV Ancef.  To p.o. on discharge.  Suggest follow-up with infectious disease/oncology on outpatient basis  Pancytopenia in the setting of CLL/CLL with immunosuppression: Initially leukopenic.  CBC stable as below.  Monitor intermittently.  Recent Labs  Lab 06/17/20 0331 06/18/20 0355 06/19/20 0358  HGB 8.3* 8.4* 8.6*  HCT 25.6* 25.9* 26.8*  WBC 4.1 3.6* 3.1*  PLT 133* 136* 782*   Acute metabolic encephalopathy due to sepsis.  Mental status at baseline.   Acute on chronic combined systolic and  diastolic congestive heart failure.  Echo with EF 25 to 30% currently euvolemic.  Has mild swelling on the leg, stable respiratory status.  Per cardiology continue with carvedilol 12.5 twice daily and if blood pressure above 160 persistently can increase to 25 twice daily, stop benazepril amlodipine doxazosin Actos due to CHF, now also started on Entresto.  Close follow-up with cardiology.   T2DM uncontrolled with A1c 7.7.  Had uncontrolled hyperglycemia with blood sugar in 300 Lantus was increased from 7 to 10 units.  At home on Metformin and Actos and sliding scale with Tresiba 12 units.  Overnight hypoglycemic on 7/4 Lantus dose cut down to 7 units.  Blood sugar overall stable.  On sliding scale.  Monitor Recent Labs  Lab 06/18/20 1730 06/18/20 2140 06/19/20 0202 06/19/20 0738 06/19/20 1146  GLUCAP 85 97 184* 227* 180*   Hypokalemia/hypophosphatemia/hypomagnesemia: Level improved.    Generalized weakness/debility: Patient appears weak frail.  Very deconditioned weak and frail.  Initially reluctant to go to skilled nursing facility however on 7/3 he finally agreed and [facility] consulted and looking into SNF. DVT prophylaxis: enoxaparin (LOVENOX) injection 40 mg Start: 06/13/20 1200 Code Status:FUll Family Communication: plan of care discussed with patient at bedside.  Status is: Inpatient  Remains inpatient appropriate because for ongoing management of patient's cellulitis, and will be looking into skilled nursing facility as he is agreeable for SNF.  He was reluctant and had refused  SNF until yesterday.  He is high risk for readmission and is weak and frail.  Dispo: Patient From: Home  Planned Disposition:  SNF  Expected discharge date: Once SNF available  Medically stable for discharge: Medically stable for discharge. Awaiting on placement Nutrition: Diet Order  Diet heart healthy/carb modified Room service appropriate? No; Fluid consistency: Thin  Diet effective now                   Nutrition Problem: Moderate Malnutrition Etiology: chronic illness, cancer and cancer related treatments Signs/Symptoms: mild fat depletion, moderate fat depletion, moderate muscle depletion Interventions: MVI, Other (Comment) (Beneprotein and [employer]) Body mass index is 21.14 kg/m.  Consultants:see note  Procedures:see note Microbiology:see note  Medications: Scheduled Meds:  acetaminophen  650 mg Oral Once   carvedilol  12.5 mg Oral BID WC   Chlorhexidine Gluconate Cloth  6 each Topical Daily   enoxaparin (LOVENOX) injection  40 mg Subcutaneous Q24H   feeding supplement (GLUCERNA SHAKE)  237 mL Oral TID WC   folic acid  1 mg Oral Daily   insulin aspart  0-9 Units Subcutaneous TID WC   insulin glargine  7 Units Subcutaneous Daily   magnesium oxide  400 mg Oral BID   multivitamin with minerals  1 tablet Oral Daily   pantoprazole  40 mg Oral Daily   potassium & sodium phosphates  1 packet Oral TID WC & HS   protein supplement  12 g Oral TID WC   rosuvastatin  5 mg Oral QHS   sacubitril-valsartan  1 tablet Oral BID   senna  1 tablet Oral Daily   sodium chloride flush  10-40 mL Intracatheter Q12H   thiamine  100 mg Oral Daily   Continuous Infusions:  sodium chloride      ceFAZolin (ANCEF) IV 2 g (06/19/20 0539)    Antimicrobials: Anti-infectives (From admission, onward)   Start     Dose/Rate Route Frequency Ordered Stop   06/10/20 1230  ceFAZolin (ANCEF) IVPB 2g/100 mL premix     Discontinue     2 g 200 mL/hr over 30 Minutes Intravenous Every 8 hours 06/10/20 1205     06/07/20 0000  vancomycin (VANCOREADY) IVPB 500 mg/100 mL  Status:  Discontinued        500 mg 100 mL/hr over 60 Minutes Intravenous Every 12 hours 06/06/20 1316 06/10/20 1141   06/06/20 1300  ceFEPIme (MAXIPIME) 2 g in sodium chloride 0.9 % 100 mL IVPB  Status:  Discontinued        2 g 200 mL/hr over 30 Minutes Intravenous Every 12 hours 06/06/20 1209 06/10/20 1141    06/06/20 1300  vancomycin (VANCOCIN) IVPB 1000 mg/200 mL premix        1,000 mg 200 mL/hr over 60 Minutes Intravenous  Once 06/06/20 1209 06/06/20 1353       Objective: Vitals: Today's Vitals   06/18/20 2253 06/19/20 0201 06/19/20 0542 06/19/20 0720  BP:   (!) 143/78   Pulse:   86   Resp:   18   Temp:   98.2 F (36.8 C)   TempSrc:   Oral   SpO2:   98%   Weight:      Height:      PainSc: 4  2   Asleep    Intake/Output Summary (Last 24 hours) at 06/19/2020 1207 Last data filed at 06/19/2020 0900 Gross per 24 hour  Intake 400 ml  Output 1600 ml  Net -1200 ml   Filed Weights   06/15/20 0500 06/16/20 0531 06/17/20 0548  Weight: 63.6 kg 64.2 kg 64 kg   Weight change:    Intake/Output from previous day: 07/04 0701 - 07/05 0700 In: 620 [P.O.:320; IV Piggyback:300] Out: 1600 [Urine:1600] Intake/Output this shift: Total I/O  In: -  Out: 500 [Urine:500]  Examination:  General exam:AAO,NAD,weak appearing. HEENT:Oral mucosa moist, Ear/Nose WNL grossly, dentition normal. Respiratory system: bilaterally ,no wheezing or crackles,no use of accessory muscle Cardiovascular system: S1 & S2 +, No JVD,. Gastrointestinal system: Abdomen soft, NT,ND, BS+ Nervous System:Alert, awake, moving extremities and grossly nonfocal Extremities: No edema, distal peripheral pulses palpable.  Right calf area with area of erythema swelling but less tender and painful overall improving. Skin: No rashes,no icterus. MSK: Normal muscle bulk,tone, power  Data Reviewed: I have personally reviewed following labs and imaging studies CBC: Recent Labs  Lab 06/15/20 0440 06/16/20 0358 06/17/20 0331 06/18/20 0355 06/19/20 0358  WBC 7.5 4.8 4.1 3.6* 3.1*  NEUTROABS 6.0 3.6 3.4 2.9 2.5  HGB 9.3* 9.1* 8.3* 8.4* 8.6*  HCT 28.6* 27.9* 25.6* 25.9* 26.8*  MCV 94.7 95.2 95.9 94.2 95.7  PLT 126* 124* 133* 136* 741*   Basic Metabolic Panel: Recent Labs  Lab 06/13/20 0402 06/14/20 0307 06/15/20 0440  06/16/20 0358 06/18/20 0355  NA 138 136 134* 135 134*  K 3.4* 3.8 3.3* 3.3* 3.0*  CL 99 106 102 100 96*  CO2 29 23 22 24 28   GLUCOSE 99 198* 199* 197* 177*  BUN 8 15 13 12 12   CREATININE 0.88 0.57* 0.52* 0.51* 0.45*  CALCIUM 8.1* 7.4* 7.4* 7.1* 7.0*  MG  --   --   --  1.3* 1.6*  PHOS 3.4 2.3*  --   --   --    GFR: Estimated Creatinine Clearance: 71.1 mL/min (A) (by C-G formula based on SCr of 0.45 mg/dL (L)). Liver Function Tests: Recent Labs  Lab 06/13/20 0402 06/14/20 0307 06/15/20 0440 06/16/20 0358  AST 11* 13* 12* 13*  ALT 13 11 8 8   ALKPHOS 33* 81 79 72  BILITOT 0.8 0.9 1.1 1.2  PROT 5.0* 3.6* 4.2* 3.9*  ALBUMIN 2.7* 1.8* 2.0* 1.9*   No results for input(s): LIPASE, AMYLASE in the last 168 hours. No results for input(s): AMMONIA in the last 168 hours. Coagulation Profile: No results for input(s): INR, PROTIME in the last 168 hours. Cardiac Enzymes: No results for input(s): CKTOTAL, CKMB, CKMBINDEX, TROPONINI in the last 168 hours. BNP (last 3 results) No results for input(s): PROBNP in the last 8760 hours. HbA1C: No results for input(s): HGBA1C in the last 72 hours. CBG: Recent Labs  Lab 06/18/20 1730 06/18/20 2140 06/19/20 0202 06/19/20 0738 06/19/20 1146  GLUCAP 85 97 184* 227* 180*   Lipid Profile: No results for input(s): CHOL, HDL, LDLCALC, TRIG, CHOLHDL, LDLDIRECT in the last 72 hours. Thyroid Function Tests: No results for input(s): TSH, T4TOTAL, FREET4, T3FREE, THYROIDAB in the last 72 hours. Anemia Panel: No results for input(s): VITAMINB12, FOLATE, FERRITIN, TIBC, IRON, RETICCTPCT in the last 72 hours. Sepsis Labs: No results for input(s): PROCALCITON, LATICACIDVEN in the last 168 hours.  No results found for this or any previous visit (from the past 240 hour(s)).    Radiology Studies: No results found.   LOS: 14 days   Antonieta Pert, MD [facility]  06/19/2020, 12:07 PM

## 2020-06-19 NOTE — Progress Notes (Signed)
Occupational Therapy Treatment Patient Details Name: Tony Marshall MRN: [id number] DOB: [birth year] Today's Date: 06/19/2020    History of present illness 79 year old with CLL on Rituxan, [place], diabetes, hypertension, hyperlipidemia admitted with diarrhea, malaise, mild DKA requiring insulin drip. Labs significant for pancytopenia, mild hyperbilirubinemia, electrolyte abnormalities.  Transferred to ICU on 6/22 for hypotension, sepsis with elevated lactic acid.  Pt also with RLE cellulitis and sepsis - onset 6/23 following RLE leg trauma in ER. DVT negative   OT comments    Follow Up Recommendations  SNF  Equipment Recommendations  3 in 1 bedside commode    Recommendations for Other Services      Precautions / Restrictions Precautions Precautions: Fall       Mobility Bed Mobility   Bed Mobility: Supine to Sit     Supine to sit: Supervision     General bed mobility comments: cues for sequencing  Transfers Overall transfer level: Needs assistance Equipment used: Rolling walker (2 wheeled) Transfers: Sit to/from Stand;Stand Pivot Transfers Sit to Stand: From elevated surface;Min guard Stand pivot transfers: Min guard            Balance Overall balance assessment: Needs assistance Sitting-balance support: No upper extremity supported;Feet supported;Feet unsupported Sitting balance-Leahy Scale: Good     Standing balance support: Bilateral upper extremity supported Standing balance-Leahy Scale: Poor Standing balance comment: required RW                           ADL either performed or assessed with clinical judgement   ADL Overall ADL's : Needs assistance/impaired     Grooming: Wash/dry face;Oral care;Wash/dry hands;Set up;Supervision/safety;Standing                   Toilet Transfer: Minimal assistance;Stand-pivot;Cueing for safety;Cueing for sequencing   Toileting- Clothing Manipulation and Hygiene: Sit to/from stand;Moderate  assistance;Cueing for safety                         Cognition Arousal/Alertness: Awake/alert Behavior During Therapy: WFL for tasks assessed/performed Overall Cognitive Status: Within Functional Limits for tasks assessed                                                     Pertinent Vitals/ Pain       Pain Assessment: No/denies pain         Frequency  Min 2X/week        Progress Toward Goals  OT Goals(current goals can now be found in the care plan section)  Progress towards OT goals: Progressing toward goals     Plan Discharge plan remains appropriate       AM-PAC OT "6 Clicks" Daily Activity     Outcome Measure   Help from another person eating meals?: None Help from another person taking care of personal grooming?: A Little Help from another person toileting, which includes using toliet, bedpan, or urinal?: A Little Help from another person bathing (including washing, rinsing, drying)?: A Little Help from another person to put on and taking off regular upper body clothing?: A Little Help from another person to put on and taking off regular lower body clothing?: A Lot 6 Click Score: 18    End of Session Equipment Utilized During Treatment: Gait belt;Rolling walker  OT Visit Diagnosis: Unsteadiness on feet (R26.81);Other abnormalities of gait and mobility (R26.89);Muscle weakness (generalized) (M62.81);Other symptoms and signs involving cognitive function;Pain   Activity Tolerance Patient tolerated treatment well (limited secondary to nausea)   Patient Left with call bell/phone within reach;with family/visitor present;in chair   Nurse Communication  (ok therapy)        Time: 1350-1403 OT Time Calculation (min): 13 min  Charges: OT General Charges $OT Visit: 1 Visit OT Treatments $Self Care/Home Management : 8-22 mins  [place] [phone] Office- [phone]      Taiyo Kozma, Edwena Felty D 06/19/2020, 4:15 PM

## 2020-06-20 ENCOUNTER — Inpatient Hospital Stay: Payer: Medicare HMO

## 2020-06-20 ENCOUNTER — Other Ambulatory Visit: Payer: Medicare HMO

## 2020-06-20 DIAGNOSIS — L02415 Cutaneous abscess of right lower limb: Secondary | ICD-10-CM

## 2020-06-20 LAB — CBC WITH DIFFERENTIAL/PLATELET
Abs Immature Granulocytes: 0.03 10*3/uL (ref 0.00–0.07)
Basophils Absolute: 0 10*3/uL (ref 0.0–0.1)
Basophils Relative: 1 %
Eosinophils Absolute: 0 10*3/uL (ref 0.0–0.5)
Eosinophils Relative: 0 %
HCT: 26 % — ABNORMAL LOW (ref 39.0–52.0)
Hemoglobin: 8.5 g/dL — ABNORMAL LOW (ref 13.0–17.0)
Immature Granulocytes: 1 %
Lymphocytes Relative: 12 %
Lymphs Abs: 0.4 10*3/uL — ABNORMAL LOW (ref 0.7–4.0)
MCH: 30.9 pg (ref 26.0–34.0)
MCHC: 32.7 g/dL (ref 30.0–36.0)
MCV: 94.5 fL (ref 80.0–100.0)
Monocytes Absolute: 0.3 10*3/uL (ref 0.1–1.0)
Monocytes Relative: 9 %
Neutro Abs: 2.8 10*3/uL (ref 1.7–7.7)
Neutrophils Relative %: 77 %
Platelets: 138 10*3/uL — ABNORMAL LOW (ref 150–400)
RBC: 2.75 MIL/uL — ABNORMAL LOW (ref 4.22–5.81)
RDW: 17.3 % — ABNORMAL HIGH (ref 11.5–15.5)
WBC: 3.5 10*3/uL — ABNORMAL LOW (ref 4.0–10.5)
nRBC: 0 % (ref 0.0–0.2)

## 2020-06-20 LAB — GLUCOSE, CAPILLARY
Glucose-Capillary: 264 mg/dL — ABNORMAL HIGH (ref 70–99)
Glucose-Capillary: 248 mg/dL — ABNORMAL HIGH (ref 70–99)
Glucose-Capillary: 111 mg/dL — ABNORMAL HIGH (ref 70–99)

## 2020-06-20 MED ORDER — SACUBITRIL-VALSARTAN 24-26 MG PO TABS: 1.0000 | ORAL_TABLET | Freq: Two times a day (BID) | ORAL | 0 refills | Status: DC

## 2020-06-20 MED ORDER — GLUCERNA SHAKE PO LIQD: 237.0000 mL | Freq: Three times a day (TID) | ORAL | 0 refills | Status: DC

## 2020-06-20 MED ORDER — LOPERAMIDE HCL 2 MG PO CAPS
2.0000 mg | ORAL_CAPSULE | ORAL | Status: DC | PRN
  Administered 2020-06-20 (×2): 2 mg via ORAL
  Filled 2020-06-20 (×2): qty 1

## 2020-06-20 MED ORDER — THIAMINE HCL 100 MG PO TABS: 100.0000 mg | ORAL_TABLET | Freq: Every day | ORAL | 0 refills | Status: AC

## 2020-06-20 MED ORDER — CEPHALEXIN 500 MG PO CAPS: 500.0000 mg | ORAL_CAPSULE | Freq: Four times a day (QID) | ORAL | 0 refills | Status: DC

## 2020-06-20 MED ORDER — TRESIBA FLEXTOUCH 100 UNIT/ML ~~LOC~~ SOPN: PEN_INJECTOR | SUBCUTANEOUS | 4 refills | Status: DC

## 2020-06-20 MED ORDER — RESOURCE INSTANT PROTEIN PO PWD PACKET: 12.0000 g | Freq: Three times a day (TID) | ORAL | 0 refills | Status: AC

## 2020-06-20 MED ORDER — CARVEDILOL 12.5 MG PO TABS: 12.5000 mg | ORAL_TABLET | Freq: Two times a day (BID) | ORAL | 0 refills | Status: AC

## 2020-06-20 MED ORDER — SENNA 8.6 MG PO TABS: 1.0000 | ORAL_TABLET | Freq: Every day | ORAL | 0 refills | Status: DC

## 2020-06-20 MED ORDER — FOLIC ACID 1 MG PO TABS: 1.0000 mg | ORAL_TABLET | Freq: Every day | ORAL | 0 refills | Status: AC

## 2020-06-20 NOTE — Plan of Care (Signed)
Discharge instructions reviewed with patient and wife,questions answered, verbalized understanding. Patient transported to main entrance via wheelchair to be taken home by wife.  

## 2020-06-20 NOTE — TOC Progression Note (Signed)
Transition of Care [facility]) - Progression Note    Patient Details  Name: Tony Marshall MRN: [id number] Date of Birth: 24-Sep-1941  Transition of Care [facility]) CM/SW Contact  Purcell Mouton, RN Phone Number: 06/20/2020, 12:04 PM  Clinical Narrative:     Spoke with wife concerning SNF, the facilities are out of network that family wanted. Wife states she will take him home with [facility] and take him to OP PT. MD is aware.   Expected Discharge Plan: [place] Barriers to Discharge: Continued Medical Work up  Expected Discharge Plan and Services Expected Discharge Plan: [place]   Discharge Planning Services: CM Consult Post Acute Care Choice: [place] arrangements for the past 2 months: Single Family Home                 DME Arranged: N/A DME Agency: NA       HH Arranged: PT, OT HH Agency: [facility] Date [place]: 06/13/20 Time [place]: 1120 Representative spoke with at [place]: [place]   Social Determinants of Health ([place]) Interventions    Readmission Risk Interventions No flowsheet data found.

## 2020-06-20 NOTE — Discharge Summary (Signed)
Physician Discharge Summary  Tony Marshall QPY:[id number] DOB: June 30, 1941 DOA: 06/05/2020  PCP: Elayne Snare, MD  Admit date: 06/05/2020 Discharge date: 06/20/2020  Admitted From: home Disposition:  [place]  Recommendations for Outpatient Follow-up:  1. Follow up with PCP in 1-2 weeks 2. Dr Huel Cote at 3.3 06/21/20 for skin biopsy 3. Please obtain BMP/CBC in one week 4. Please follow up on the following pending results:  Home Health:YES  Equipment/Devices: None  Discharge Condition: Stable Code Status:full Diet recommendation:  Diet Order            Diet - low sodium heart healthy           Diet heart healthy/carb modified Room service appropriate? No; Fluid consistency: Thin  Diet effective now                  Brief/Interim Summary: 79 year old with CLL on Rituxan, [place], diabetes, hypertension, hyperlipidemia admitted with diarrhea, malaise, mild DKA requiring insulin drip. Labs significant for pancytopenia, mild hyperbilirubinemia, electrolyte abnormalities. Transferred to ICU on 6/22 for hypotension, sepsis with elevated lactic acid. PCCM consulted for help with management. Had to be placed on pressors. He stabilized. He was weaned off of Levophed 6/25. He was stabilized and transferred to [facility] service. Patient received IV Ancef with improvement in his cellulitis.  Overall patient is hemodynamically stable.  He remains deconditioned and weak PT assisted to skilled nursing facility which he was refusing initially and subsequently agreed given significant deconditioning and difficulty even to stand. Patient is medically stable awaiting on placement Patient has been in the hospital for at least 2 weeks now. Was waiting for placement, multiple offers received based upon patient's insurance but patient's wife did not like them and at this time she decided to take the patient home with home health outpatient PT OT   Discharge Diagnoses:  Active Problems:   Increased anion gap  metabolic acidosis   Altered mental status   Failure to thrive in adult   Symptomatic anemia   Acute respiratory failure (HCC)   Hyperglycemia due to diabetes mellitus (HCC)   Leukopenia   Acute combined systolic and diastolic heart failure (HCC)   Cardiogenic shock (HCC)   Malnutrition of moderate degree  Septic shock due to right lower extremity cellulitis in the setting of immunosuppression with CLL: Initially on vasopressors off since 6/25, also was on vancomycin and cefepime. Septic shock resolved.  Patient on IV Ancef, has been in the hospital for almost 2 weeks.  Due to CLL will continue on few more days of oral antibiotics with instruction to follow-up with Dr. Julien Nordmann as he may need biopsy on that side if remains the same.  He does have area of erythema mild tenderness and induration on the right calf -but clinically has improved, afebrile and white counts are stable. Discussed w ID as well. He will be going for skin biopsy tomororw at 3.3 at Dr Claudia Desanctis office as per ID and cont antibiotics.  Pancytopenia in the setting of CLL/CLL with immunosuppression: Initially leukopenic.    His CBC is overall stable.  Follow-up with hematology.    Acute metabolic encephalopathy due to sepsis.    At baseline and alert awake oriented.  Acute on chronic combined systolic and diastolic congestive heart failure.  Echo with EF 25 to 30% currently euvolemic.  Has mild swelling on the leg, stable respiratory status.  Per cardiology continue with carvedilol 12.5 twice daily and if blood pressure above 160 persistently can increase to 25 twice  daily, is off Benzapril, amlodipine doxazosin Actos.  He is now on Entresto.  Will follow up with cardiology and monitor volume status at home.    T2DM uncontrolled with A1c 7.7.  Had uncontrolled hyperglycemia with blood sugar in 300 Lantus was increased from 7 to 10 units.  At home on Metformin and Actos and sliding scale with Tresiba 12 units.  Overnight hypoglycemic  on 7/4 Lantus dose cut down to 7 units.  Patient advised to continue Lantus at current dose and slowly increase at home and follow-up with PCP  Blood sugar  136>178>264>248.  Hypokalemia/hypophosphatemia/hypomagnesemia:  Resolved..    Generalized weakness/debility: Patient appears weak frail.  Very deconditioned weak and frail.  Initially reluctant to go to skilled nursing facility however on 7/3 he finally agreed and [facility] consulted and have offered from multiple places but patient wife not interested in those particular skilled nursing facility and at this time decided take the patient home.  We will set up home health and outpatient PT OT.    Consults:  PCCM  Cardiology  Plastic surgery  ID  Subjective: FEELS WELL. Wife at bedside  plans to take him home as she does not like the in-network SNF offers that were received  Discharge Exam: Vitals:   06/20/20 0519 06/20/20 1121  BP: (!) 149/77 139/75  Pulse: 89 93  Resp: 18 20  Temp: 98.4 F (36.9 C) 98 F (36.7 C)  SpO2: 97% 99%   General: Pt is alert, awake, not in acute distress Cardiovascular: RRR, S1/S2 +, no rubs, no gallops Respiratory: CTA bilaterally, no wheezing, no rhonchi Abdominal: Soft, NT, ND, bowel sounds + Extremities: no edema, no cyanosis  Discharge Instructions  Discharge Instructions    Ambulatory referral to Physical Therapy   Complete by: As directed    Diet - low sodium heart healthy   Complete by: As directed    Discharge instructions   Complete by: As directed    Please call call MD or return to ER for similar or worsening recurring problem that brought you to hospital or if any fever,nausea/vomiting,abdominal pain, uncontrolled pain, chest pain,  shortness of breath or any other alarming symptoms.  Please follow-up your doctor as instructed in a week time and call the office for appointment.  Please avoid alcohol, smoking, or any other illicit substance and maintain healthy habits including  taking your regular medications as prescribed.  You were cared for by a hospitalist during your hospital stay. If you have any questions about your discharge medications or the care you received while you were in the hospital after you are discharged, you can call the unit and ask to speak with the hospitalist on call if the hospitalist that took care of you is not available.  Once you are discharged, your primary care physician will handle any further medical issues. Please note that NO REFILLS for any discharge medications will be authorized once you are discharged, as it is imperative that you return to your primary care physician (or establish a relationship with a primary care physician if you do not have one) for your aftercare needs so that they can reassess your need for medications and monitor your lab values   Discharge wound care:   Complete by: As directed    Elevate the right leg, follow-up with Dr. Julien Nordmann within for 5 days to monitor.  If he has worsening swelling redness call your MD or seek ED care   Increase activity slowly   Complete by:  As directed      Allergies as of 06/20/2020      Reactions   Glycopyrrolate Rash      Medication List    STOP taking these medications   amLODipine 5 MG tablet Commonly known as: NORVASC   benazepril 20 MG tablet Commonly known as: LOTENSIN   doxazosin 4 MG tablet Commonly known as: CARDURA   ibuprofen 200 MG tablet Commonly known as: ADVIL   pioglitazone 15 MG tablet Commonly known as: ACTOS     TAKE these medications   acetaminophen 500 MG tablet Commonly known as: TYLENOL Take 500 mg by mouth every 6 (six) hours as needed for moderate pain.   allopurinol 100 MG tablet Commonly known as: Zyloprim Take 1 tablet (100 mg total) by mouth 2 (two) times daily.   aspirin 81 MG EC tablet Take 81 mg by mouth daily. Swallow whole.   carvedilol 12.5 MG tablet Commonly known as: COREG Take 1 tablet (12.5 mg total) by mouth 2  (two) times daily with a meal. What changed:   medication strength  how much to take  when to take this   cephALEXin 500 MG capsule Commonly known as: KEFLEX Take 1 capsule (500 mg total) by mouth 4 (four) times daily for 7 days.   dronabinol 2.5 MG capsule Commonly known as: MARINOL Take 1 capsule (2.5 mg total) by mouth 2 (two) times daily before a meal.   esomeprazole 40 MG capsule Commonly known as: NEXIUM TAKE 1 CAPSULE BY MOUTH EVERY MORNING 30 MINUTES BEFORE BREAKFAST What changed: See the new instructions.   feeding supplement (GLUCERNA SHAKE) Liqd Take 237 mLs by mouth 3 (three) times daily with meals.   folic acid 1 MG tablet Commonly known as: FOLVITE Take 1 tablet (1 mg total) by mouth daily. Start taking on: June 21, 2020   Glucagon Emergency 1 MG Kit Inject 1 mg into the skin once as needed (For severe low blood sugar).   loperamide 2 MG tablet Commonly known as: IMODIUM A-D Take 2 mg by mouth 4 (four) times daily as needed for diarrhea or loose stools.   metFORMIN 500 MG 24 hr tablet Commonly known as: GLUCOPHAGE-XR Take 1 tablet (500 mg total) by mouth daily with supper.   NovoLOG FlexPen 100 UNIT/ML FlexPen Generic drug: insulin aspart Inject 3-4 Units into the skin 3 (three) times daily with meals. Inject 3-4 units under the skin three times daily before meals.   OneTouch Verio test strip Generic drug: glucose blood USE 1 STRIP TO CHECK GLUCOSE THREE TIMES DAILY   oxymetazoline 0.05 % nasal spray Commonly known as: AFRIN Place 1 spray into both nostrils 2 (two) times daily as needed for congestion.   potassium chloride SA 20 MEQ tablet Commonly known as: KLOR-CON Take 1 tablet (20 mEq total) by mouth 2 (two) times daily.   prochlorperazine 10 MG tablet Commonly known as: COMPAZINE Take 1 tablet (10 mg total) by mouth every 6 (six) hours as needed for nausea or vomiting.   protein supplement 6 g Powd Commonly known as: RESOURCE  BENEPROTEIN Take 2 Scoops (12 g total) by mouth 3 (three) times daily with meals for 7 days.   rosuvastatin 5 MG tablet Commonly known as: CRESTOR TAKE 1 TABLET BY MOUTH EVERY DAY   sacubitril-valsartan 24-26 MG Commonly known as: ENTRESTO Take 1 tablet by mouth 2 (two) times daily.   senna 8.6 MG Tabs tablet Commonly known as: SENOKOT Take 1 tablet (8.6 mg total) by  mouth daily. Start taking on: June 21, 2020   thiamine 100 MG tablet Take 1 tablet (100 mg total) by mouth daily. Start taking on: June 21, 2020   Tyler Aas FlexTouch 100 UNIT/ML FlexTouch Pen Generic drug: insulin degludec inject 7 units daily. if fasting blood sugars are over 140 increase dosage up to 15 units daily as directed. What changed: additional instructions            Durable Medical Equipment  (From admission, onward)         Start     Ordered   06/15/20 1356  For home use only DME Bedside commode  Once       Question:  Patient needs a bedside commode to treat with the following condition  Answer:  Fear for personal safety   06/15/20 1358   06/15/20 1352  For home use only DME Hospital bed  Once       Question Answer Comment  Length of Need Lifetime   Patient has (list medical condition): xLeukemia, Cancer and CHF   The above medical condition requires: Patient requires the ability to reposition frequently   Head must be elevated greater than: 45 degrees   Bed type Semi-electric   Support Surface: Gel Overlay      06/15/20 1358   06/15/20 1351  For home use only DME 4 wheeled rolling walker with seat  Once       Question:  Patient needs a walker to treat with the following condition  Answer:  Fear for personal safety   06/15/20 1358           Discharge Care Instructions  (From admission, onward)         Start     Ordered   06/20/20 0000  Discharge wound care:       Comments: Elevate the right leg, follow-up with Dr. Julien Nordmann within for 5 days to monitor.  If he has worsening swelling  redness call your MD or seek ED care   06/20/20 [facility] Follow up.   Specialty: Home Health Services Why: agency will provide home health physical and occupational therapy. Contact information: [address] [place] [address] [phone]        Lendon Colonel, NP Follow up.   Specialties: Nurse Practitioner, Radiology, Cardiology Why: [facility] - a follow-up appointment has been arranged for you Monday July 03, 2020 at 10:45 AM (Arrive by 10:30 AM). Curt Bears is one of the nurse practitioners that works closely with our cardiology team. Contact information: [address] [place] [address] [facility] Follow up.   Why: Hospital bed is from this company. Please call if you have any questions.  Contact information: [place] [address] [phone]        Elayne Snare, MD Follow up in 1 week(s).   Specialty: Endocrinology Contact information: [place] [address] [phone]        Skeet Latch, MD .   Specialty: Cardiology Contact information: [address] [place] [address] [phone]        Carlyle Basques, MD Follow up in 1 week(s).   Specialty: Infectious Diseases Contact information: [place] [address] [phone]  Allergies  Allergen Reactions  . Glycopyrrolate Rash    The results of significant diagnostics from this hospitalization (including imaging, microbiology, ancillary and laboratory) are listed below for reference.    Microbiology: No results found for this or any previous visit (from the past 240 hour(s)).  Procedures/Studies: CT ABDOMEN PELVIS WO CONTRAST  Result Date: 06/07/2020 CLINICAL DATA:  Respiratory failure and abdominal pain with fever. History  of colonic polyps and diverticulosis. History of CLL. EXAM: CT CHEST, ABDOMEN AND PELVIS WITHOUT CONTRAST TECHNIQUE: Multidetector CT imaging of the chest, abdomen and pelvis was performed following the standard protocol without IV contrast. COMPARISON:  Chest CTA 05/16/2020.  Abdominopelvic CT 04/05/2020. FINDINGS: CT CHEST FINDINGS Cardiovascular: Right arm PICC extends to the superior cavoatrial junction. There is diffuse atherosclerosis of the aorta, great vessels and coronary arteries. The heart is mildly enlarged. No significant pericardial fluid. Mediastinum/Nodes: There are no enlarged mediastinal, hilar or axillary lymph nodes. The thyroid gland, trachea and esophagus demonstrate no significant findings. Lungs/Pleura: There are new small bilateral pleural effusions with dependent opacities in both lungs, probably atelectasis. These obscure the previously demonstrated nodular densities in both lower lobes. These were not present on the abdominal CT from April and were probably inflammatory. No consolidation or new abnormality identified. There is mild central airway thickening. Musculoskeletal/Chest wall: No chest wall mass or suspicious osseous findings. CT ABDOMEN AND PELVIS FINDINGS Hepatobiliary: No focal hepatic abnormalities are identified on noncontrast imaging. The gallbladder is mildly distended without evidence of gallstones, wall thickening or focal surrounding inflammation. Pancreas: No focal pancreatic abnormality or surrounding inflammation identified. Spleen: Resolution of previously demonstrated splenomegaly. No focal abnormality identified. Adrenals/Urinary Tract: Both adrenal glands appear normal. Stable dominant cyst in the lower interpolar region of the right kidney. No evidence of urinary tract calculus or hydronephrosis. New symmetric perinephric soft tissue stranding bilaterally without focal fluid collection. The bladder is decompressed by a Foley catheter. There is a small amount of  air in the bladder lumen. Possible mild bladder wall thickening. Stomach/Bowel: Enteric contrast is present within the colon, but there is none in the stomach or small bowel. The stomach and small bowel are decompressed without apparent focal wall thickening. The appendix appears normal. Diverticular changes are present within the descending and sigmoid colon. No evidence of bowel obstruction or perforation. Vascular/Lymphatic: Again demonstrated are several mildly enlarged retroperitoneal and left pelvic lymph nodes, similar to the prior CT and presumably related to the patient's CLL. No progressive adenopathy identified. Diffuse aortic and branch vessel atherosclerosis without acute vascular findings on noncontrast imaging. Reproductive: Central prostatic calcifications. Other: New small amount of ascites around the liver and in both pericolic gutters and the pelvis. No focal extraluminal fluid collection or extravasated enteric contrast. There is generalized edema throughout the intra-abdominal and subcutaneous fat. There is a stable small umbilical hernia containing only fat. Musculoskeletal: No acute or significant osseous findings. IMPRESSION: 1. New small bilateral pleural effusions, ascites and generalized edema throughout the intra-abdominal and subcutaneous fat, consistent with anasarca. No focal extraluminal fluid collection or extravasated enteric contrast. 2. New symmetric perinephric soft tissue stranding bilaterally without focal fluid collection. The bladder is decompressed by a Foley catheter. Possible mild bladder wall thickening. Correlate with urinalysis. 3. Probable atelectasis at both lung bases adjacent to the pleural effusions. No evidence of pneumonia. 4. Stable mildly enlarged retroperitoneal and left pelvic lymph nodes, presumably related to the patient's chronic lymphocytic leukemia. No significant residual splenomegaly. 5. Aortic Atherosclerosis (ICD10-I70.0). Electronically Signed   By:  Richardean Sale M.D.   On: 06/07/2020 14:08   CT Head Wo Contrast  Result Date: 06/05/2020 CLINICAL DATA:  Altered mental status fatigue and weakness EXAM: CT HEAD WITHOUT CONTRAST TECHNIQUE: Contiguous axial images were obtained from the base of the skull through the vertex without intravenous contrast. COMPARISON:  MRI 11/07/2014 FINDINGS: Brain: No acute territorial infarction, hemorrhage, or intracranial mass. Mild to moderate atrophy. Mild hypodensity in the white matter consistent with chronic small vessel ischemic change. Prominent ventricle size, likely due to atrophy. Vascular: No hyperdense vessels.  No unexpected calcification Skull: Motion artifact.  No gross fracture Sinuses/Orbits: No acute finding. Other: None IMPRESSION: 1. No CT evidence for acute intracranial abnormality. 2. Atrophy and mild chronic small vessel ischemic changes of the white matter. Electronically Signed   By: Donavan Foil M.D.   On: 06/05/2020 17:29   CT CHEST WO CONTRAST  Result Date: 06/07/2020 CLINICAL DATA:  Respiratory failure and abdominal pain with fever. History of colonic polyps and diverticulosis. History of CLL. EXAM: CT CHEST, ABDOMEN AND PELVIS WITHOUT CONTRAST TECHNIQUE: Multidetector CT imaging of the chest, abdomen and pelvis was performed following the standard protocol without IV contrast. COMPARISON:  Chest CTA 05/16/2020.  Abdominopelvic CT 04/05/2020. FINDINGS: CT CHEST FINDINGS Cardiovascular: Right arm PICC extends to the superior cavoatrial junction. There is diffuse atherosclerosis of the aorta, great vessels and coronary arteries. The heart is mildly enlarged. No significant pericardial fluid. Mediastinum/Nodes: There are no enlarged mediastinal, hilar or axillary lymph nodes. The thyroid gland, trachea and esophagus demonstrate no significant findings. Lungs/Pleura: There are new small bilateral pleural effusions with dependent opacities in both lungs, probably atelectasis. These obscure the  previously demonstrated nodular densities in both lower lobes. These were not present on the abdominal CT from April and were probably inflammatory. No consolidation or new abnormality identified. There is mild central airway thickening. Musculoskeletal/Chest wall: No chest wall mass or suspicious osseous findings. CT ABDOMEN AND PELVIS FINDINGS Hepatobiliary: No focal hepatic abnormalities are identified on noncontrast imaging. The gallbladder is mildly distended without evidence of gallstones, wall thickening or focal surrounding inflammation. Pancreas: No focal pancreatic abnormality or surrounding inflammation identified. Spleen: Resolution of previously demonstrated splenomegaly. No focal abnormality identified. Adrenals/Urinary Tract: Both adrenal glands appear normal. Stable dominant cyst in the lower interpolar region of the right kidney. No evidence of urinary tract calculus or hydronephrosis. New symmetric perinephric soft tissue stranding bilaterally without focal fluid collection. The bladder is decompressed by a Foley catheter. There is a small amount of air in the bladder lumen. Possible mild bladder wall thickening. Stomach/Bowel: Enteric contrast is present within the colon, but there is none in the stomach or small bowel. The stomach and small bowel are decompressed without apparent focal wall thickening. The appendix appears normal. Diverticular changes are present within the descending and sigmoid colon. No evidence of bowel obstruction or perforation. Vascular/Lymphatic: Again demonstrated are several mildly enlarged retroperitoneal and left pelvic lymph nodes, similar to the prior CT and presumably related to the patient's CLL. No progressive adenopathy identified. Diffuse aortic and branch vessel atherosclerosis without acute vascular findings on noncontrast imaging. Reproductive: Central prostatic calcifications. Other: New small amount of ascites around the liver and in both pericolic gutters  and the pelvis. No focal extraluminal fluid collection or extravasated enteric contrast. There is generalized edema throughout the intra-abdominal and subcutaneous fat. There is a stable small umbilical hernia containing only fat. Musculoskeletal: No acute or significant osseous findings. IMPRESSION: 1. New small bilateral pleural effusions,  ascites and generalized edema throughout the intra-abdominal and subcutaneous fat, consistent with anasarca. No focal extraluminal fluid collection or extravasated enteric contrast. 2. New symmetric perinephric soft tissue stranding bilaterally without focal fluid collection. The bladder is decompressed by a Foley catheter. Possible mild bladder wall thickening. Correlate with urinalysis. 3. Probable atelectasis at both lung bases adjacent to the pleural effusions. No evidence of pneumonia. 4. Stable mildly enlarged retroperitoneal and left pelvic lymph nodes, presumably related to the patient's chronic lymphocytic leukemia. No significant residual splenomegaly. 5. Aortic Atherosclerosis (ICD10-I70.0). Electronically Signed   By: Richardean Sale M.D.   On: 06/07/2020 14:08   DG CHEST PORT 1 VIEW  Result Date: 06/10/2020 CLINICAL DATA:  Respiratory abnormality. EXAM: PORTABLE CHEST 1 VIEW COMPARISON:  June 08, 2020 FINDINGS: A right PICC line terminates in the central SVC. Haziness in the lung bases is identified. Mild cardiomegaly. The hila and mediastinum are unremarkable. No nodules or masses. No overt edema. IMPRESSION: 1. Increasing hazy opacities in the lung bases could represent developing infiltrate or layering effusions with underlying atelectasis. Recommend clinical correlation and attention on follow-up. 2. The right PICC line remains in good position. 3. No acute interval changes otherwise seen. Electronically Signed   By: Dorise Bullion III M.D   On: 06/10/2020 06:41   DG CHEST PORT 1 VIEW  Result Date: 06/08/2020 CLINICAL DATA:  Sepsis. EXAM: PORTABLE CHEST  1 VIEW COMPARISON:  CT report 06/07/2020.  Chest x-ray 06/07/2020. FINDINGS: Right PICC line noted with tip at cavoatrial junction. Heart size stable. Mild bibasilar atelectasis. No pleural effusion or pneumothorax. Stable small calcific density noted the right humeral head, most likely a bone island. IMPRESSION: 1.  Right PICC line noted with tip at cavoatrial junction. 2.  Low lung volumes.  Mild bibasilar atelectasis. Electronically Signed   By: Marcello Moores  Register   On: 06/08/2020 07:25   DG Chest Port 1 View  Result Date: 06/07/2020 CLINICAL DATA:  Respiratory failure EXAM: PORTABLE CHEST 1 VIEW COMPARISON:  06/06/2020 FINDINGS: Cardiac shadow is at the upper limits of normal in size and stable. Aortic calcifications are again seen. The lungs are well aerated bilaterally. No focal infiltrate or sizable effusion is seen. No bony abnormality is noted. IMPRESSION: No acute abnormality seen. Electronically Signed   By: Inez Catalina M.D.   On: 06/07/2020 09:03   DG CHEST PORT 1 VIEW  Result Date: 06/06/2020 CLINICAL DATA:  Possible pneumonia EXAM: PORTABLE CHEST 1 VIEW COMPARISON:  06/05/2020 FINDINGS: Cardiac shadow is stable. Tortuous aorta with calcifications is again seen. Lungs are well aerated bilaterally. No focal infiltrate or sizable effusion is seen. Some accentuation of the mediastinum is noted due to patient rotation. IMPRESSION: No acute abnormality noted. Electronically Signed   By: Inez Catalina M.D.   On: 06/06/2020 13:25   DG Chest Portable 1 View  Result Date: 06/05/2020 CLINICAL DATA:  Fatigue and weakness. EXAM: PORTABLE CHEST 1 VIEW COMPARISON:  May 16, 2020 FINDINGS: The study is limited secondary to patient rotation. There is no evidence of acute infiltrate, pleural effusion or pneumothorax. The heart size and mediastinal contours are within normal limits. A chronic tenth left rib fracture is seen. The visualized skeletal structures are otherwise unremarkable. IMPRESSION: No active  disease. Electronically Signed   By: Virgina Norfolk M.D.   On: 06/05/2020 16:07   ECHOCARDIOGRAM COMPLETE  Result Date: 06/07/2020    ECHOCARDIOGRAM REPORT   Patient Name:   Tony Marshall Ostrander Date of Exam: 06/07/2020 Medical Rec #:  [id number]         Height:       68.5 in Accession #:    [id number]        Weight:       130.1 lb Date of Birth:  [birth year]         BSA:          76.711 m Patient Age:    73 years          BP:           93/60 mmHg Patient Gender: M                 HR:           69 bpm. Exam Location:  Inpatient Procedure: 2D Echo, Cardiac Doppler, Color Doppler and Intracardiac            Opacification Agent Indications:    Chemo V67.2 / Z09                 Fever 780.6 / R50.9  History:        Patient has no prior history of Echocardiogram examinations.                 Risk Factors:Hypertension, Diabetes, Dyslipidemia and                 Non-Smoker. GERD.  Sonographer:    Vickie Epley RDCS Referring Phys: [id number] [place]  1. Left ventricular ejection fraction, by estimation, is 25 to 30%. The left ventricle has severely decreased function. The left ventricle has no regional wall motion abnormalities. Left ventricular diastolic parameters are consistent with Grade II diastolic dysfunction (pseudonormalization).  2. Right ventricular systolic function is normal. The right ventricular size is normal. There is normal pulmonary artery systolic pressure.  3. The mitral valve is normal in structure. No evidence of mitral valve regurgitation. No evidence of mitral stenosis.  4. The aortic valve is grossly normal. Aortic valve regurgitation is not visualized. No aortic stenosis is present.  5. The inferior vena cava is normal in size with greater than 50% respiratory variability, suggesting right atrial pressure of 3 mmHg. FINDINGS  Left Ventricle: Left ventricular ejection fraction, by estimation, is 25 to 30%. The left ventricle has severely decreased function. The left ventricle has no  regional wall motion abnormalities. Definity contrast agent was given IV to delineate the left  ventricular endocardial borders. The left ventricular internal cavity size was normal in size. There is no left ventricular hypertrophy. Left ventricular diastolic parameters are consistent with Grade II diastolic dysfunction (pseudonormalization). Right Ventricle: The right ventricular size is normal. No increase in right ventricular wall thickness. Right ventricular systolic function is normal. There is normal pulmonary artery systolic pressure. The tricuspid regurgitant velocity is 1.96 m/s, and  with an assumed right atrial pressure of 3 mmHg, the estimated right ventricular systolic pressure is 85.2 mmHg. Left Atrium: Left atrial size was normal in size. Right Atrium: Right atrial size was normal in size. Pericardium: There is no evidence of pericardial effusion. Mitral Valve: The mitral valve is normal in structure. No evidence of mitral valve regurgitation. No evidence of mitral valve stenosis. Tricuspid Valve: The tricuspid valve is normal in structure. Tricuspid valve regurgitation is mild . No evidence of tricuspid stenosis. Aortic Valve: The aortic valve is grossly normal. Aortic valve regurgitation is not visualized. No aortic stenosis is present. Pulmonic Valve: The pulmonic valve was normal in structure. Pulmonic valve regurgitation  is not visualized. No evidence of pulmonic stenosis. Aorta: The aortic root and ascending aorta are structurally normal, with no evidence of dilitation. Venous: The inferior vena cava is normal in size with greater than 50% respiratory variability, suggesting right atrial pressure of 3 mmHg. IAS/Shunts: The atrial septum is grossly normal.  LEFT VENTRICLE PLAX 2D LVIDd:         4.40 cm      Diastology LVIDs:         3.70 cm      LV e' lateral:   7.27 cm/s LV PW:         0.70 cm      LV E/e' lateral: 10.3 LV IVS:        0.70 cm      LV e' medial:    6.57 cm/s LVOT diam:     1.80  cm      LV E/e' medial:  11.4 LV SV:         26 LV SV Index:   15 LVOT Area:     2.54 cm  LV Volumes (MOD) LV vol d, MOD A2C: 126.0 ml LV vol d, MOD A4C: 113.0 ml LV vol s, MOD A2C: 85.1 ml LV vol s, MOD A4C: 60.7 ml LV SV MOD A2C:     40.9 ml LV SV MOD A4C:     113.0 ml LV SV MOD BP:      44.6 ml RIGHT VENTRICLE RV S prime:     7.97 cm/s TAPSE (M-mode): 1.6 cm LEFT ATRIUM             Index       RIGHT ATRIUM           Index LA diam:        3.10 cm 1.81 cm/m  RA Area:     13.40 cm LA Vol (A2C):   28.0 ml 16.36 ml/m RA Volume:   31.10 ml  18.17 ml/m LA Vol (A4C):   23.0 ml 13.44 ml/m LA Biplane Vol: 27.5 ml 16.07 ml/m  AORTIC VALVE LVOT Vmax:   58.50 cm/s LVOT Vmean:  39.600 cm/s LVOT VTI:    0.102 m  AORTA Ao Root diam: 3.40 cm MITRAL VALVE               TRICUSPID VALVE MV Area (PHT): 4.06 cm    TR Peak grad:   15.4 mmHg MV Decel Time: 187 msec    TR Vmax:        196.00 cm/s MV E velocity: 75.00 cm/s MV A velocity: 72.40 cm/s  SHUNTS MV E/A ratio:  1.04        Systemic VTI:  0.10 m                            Systemic Diam: 1.80 cm Mertie Moores MD Electronically signed by Mertie Moores MD Signature Date/Time: 06/07/2020/12:52:20 PM    Final    VAS [place] LOWER EXTREMITY VENOUS (DVT)  Result Date: 06/11/2020  Lower Venous DVTStudy Indications: Swelling.  Risk Factors: None identified. Comparison Study: 06/07/2020 - Negative for DVT. Performing Technologist: Oliver Hum RVT  Examination Guidelines: A complete evaluation includes B-mode imaging, spectral Doppler, color Doppler, and power Doppler as needed of all accessible portions of each vessel. Bilateral testing is considered an integral part of a complete examination. Limited examinations for reoccurring indications may be performed as noted. The reflux portion of the exam is  performed with the patient in reverse Trendelenburg.  +---------+---------------+---------+-----------+----------+--------------+ RIGHT     CompressibilityPhasicitySpontaneityPropertiesThrombus Aging +---------+---------------+---------+-----------+----------+--------------+ CFV      Full           Yes      Yes                                 +---------+---------------+---------+-----------+----------+--------------+ SFJ      Full                                                        +---------+---------------+---------+-----------+----------+--------------+ FV Prox  Full                                                        +---------+---------------+---------+-----------+----------+--------------+ FV Mid   Full                                                        +---------+---------------+---------+-----------+----------+--------------+ FV DistalFull                                                        +---------+---------------+---------+-----------+----------+--------------+ PFV      Full                                                        +---------+---------------+---------+-----------+----------+--------------+ POP      Full           Yes      Yes                                 +---------+---------------+---------+-----------+----------+--------------+ PTV      Full                                                        +---------+---------------+---------+-----------+----------+--------------+ PERO     Full                                                        +---------+---------------+---------+-----------+----------+--------------+   +----+---------------+---------+-----------+----------+--------------+ LEFTCompressibilityPhasicitySpontaneityPropertiesThrombus Aging +----+---------------+---------+-----------+----------+--------------+ CFV Full           Yes      Yes                                 +----+---------------+---------+-----------+----------+--------------+  Summary: RIGHT: - There is no evidence of deep vein thrombosis in the lower  extremity.  - No cystic structure found in the popliteal fossa.  LEFT: - No evidence of common femoral vein obstruction.  *See table(s) above for measurements and observations. Electronically signed by Deitra Mayo MD on 06/11/2020 at 6:52:41 PM.    Final    VAS [place] LOWER EXTREMITY VENOUS (DVT)  Result Date: 06/07/2020  Lower Venous DVTStudy Indications: Pain.  Risk Factors: None identified. Limitations: Poor ultrasound/tissue interface. Comparison Study: No prior studies. Performing Technologist: Oliver Hum RVT  Examination Guidelines: A complete evaluation includes B-mode imaging, spectral Doppler, color Doppler, and power Doppler as needed of all accessible portions of each vessel. Bilateral testing is considered an integral part of a complete examination. Limited examinations for reoccurring indications may be performed as noted. The reflux portion of the exam is performed with the patient in reverse Trendelenburg.  +---------+---------------+---------+-----------+----------+--------------+ RIGHT    CompressibilityPhasicitySpontaneityPropertiesThrombus Aging +---------+---------------+---------+-----------+----------+--------------+ CFV      Full           Yes      Yes                                 +---------+---------------+---------+-----------+----------+--------------+ SFJ      Full                                                        +---------+---------------+---------+-----------+----------+--------------+ FV Prox  Full                                                        +---------+---------------+---------+-----------+----------+--------------+ FV Mid   Full                                                        +---------+---------------+---------+-----------+----------+--------------+ FV DistalFull                                                        +---------+---------------+---------+-----------+----------+--------------+ PFV       Full                                                        +---------+---------------+---------+-----------+----------+--------------+ POP      Full           Yes      Yes                                 +---------+---------------+---------+-----------+----------+--------------+ PTV      Full                                                        +---------+---------------+---------+-----------+----------+--------------+  PERO     Full                                                        +---------+---------------+---------+-----------+----------+--------------+   +----+---------------+---------+-----------+----------+--------------+ LEFTCompressibilityPhasicitySpontaneityPropertiesThrombus Aging +----+---------------+---------+-----------+----------+--------------+ CFV Full           Yes      Yes                                 +----+---------------+---------+-----------+----------+--------------+     Summary: RIGHT: - There is no evidence of deep vein thrombosis in the lower extremity.  - No cystic structure found in the popliteal fossa.  LEFT: - No evidence of common femoral vein obstruction.  *See table(s) above for measurements and observations. Electronically signed by Curt Jews MD on 06/07/2020 at 3:44:25 PM.    Final    [place] EKG SITE RITE  Result Date: 06/07/2020 If Site Rite image not attached, placement could not be confirmed due to current cardiac rhythm.   Labs: BNP (last 3 results) No results for input(s): BNP in the last 8760 hours. Basic Metabolic Panel: Recent Labs  Lab 06/14/20 0307 06/15/20 0440 06/16/20 0358 06/18/20 0355  NA 136 134* 135 134*  K 3.8 3.3* 3.3* 3.0*  CL 106 102 100 96*  CO2 23 22 24 28   GLUCOSE 198* 199* 197* 177*  BUN 15 13 12 12   CREATININE 0.57* 0.52* 0.51* 0.45*  CALCIUM 7.4* 7.4* 7.1* 7.0*  MG  --   --  1.3* 1.6*  PHOS 2.3*  --   --   --    Liver Function Tests: Recent Labs  Lab 06/14/20 0307  06/15/20 0440 06/16/20 0358  AST 13* 12* 13*  ALT 11 8 8   ALKPHOS 81 79 72  BILITOT 0.9 1.1 1.2  PROT 3.6* 4.2* 3.9*  ALBUMIN 1.8* 2.0* 1.9*   No results for input(s): LIPASE, AMYLASE in the last 168 hours. No results for input(s): AMMONIA in the last 168 hours. CBC: Recent Labs  Lab 06/16/20 0358 06/17/20 0331 06/18/20 0355 06/19/20 0358 06/20/20 0427  WBC 4.8 4.1 3.6* 3.1* 3.5*  NEUTROABS 3.6 3.4 2.9 2.5 2.8  HGB 9.1* 8.3* 8.4* 8.6* 8.5*  HCT 27.9* 25.6* 25.9* 26.8* 26.0*  MCV 95.2 95.9 94.2 95.7 94.5  PLT 124* 133* 136* 132* 138*   Cardiac Enzymes: No results for input(s): CKTOTAL, CKMB, CKMBINDEX, TROPONINI in the last 168 hours. BNP: Invalid input(s): POCBNP CBG: Recent Labs  Lab 06/19/20 1744 06/19/20 2138 06/20/20 0735 06/20/20 1119 06/20/20 1645  GLUCAP 136* 178* 264* 248* 111*   D-Dimer No results for input(s): DDIMER in the last 72 hours. Hgb A1c No results for input(s): HGBA1C in the last 72 hours. Lipid Profile No results for input(s): CHOL, HDL, LDLCALC, TRIG, CHOLHDL, LDLDIRECT in the last 72 hours. Thyroid function studies No results for input(s): TSH, T4TOTAL, T3FREE, THYROIDAB in the last 72 hours.  Invalid input(s): FREET3 Anemia work up No results for input(s): VITAMINB12, FOLATE, FERRITIN, TIBC, IRON, RETICCTPCT in the last 72 hours. Urinalysis    Component Value Date/Time   COLORURINE YELLOW 06/05/2020 Home 06/05/2020 1518   LABSPEC 1.021 06/05/2020 1518   PHURINE 5.0 06/05/2020 1518   GLUCOSEU >=500 (A) 06/05/2020 1518  GLUCOSEU >=1000 (A) 04/07/2017 0941   HGBUR NEGATIVE 06/05/2020 1518   BILIRUBINUR NEGATIVE 06/05/2020 1518   BILIRUBINUR negative 07/08/2018 1523   KETONESUR 80 (A) 06/05/2020 1518   PROTEINUR NEGATIVE 06/05/2020 1518   UROBILINOGEN 0.2 07/08/2018 1523   UROBILINOGEN 0.2 04/07/2017 0941   NITRITE NEGATIVE 06/05/2020 1518   LEUKOCYTESUR NEGATIVE 06/05/2020 1518   Sepsis Labs Invalid  input(s): PROCALCITONIN,  WBC,  LACTICIDVEN Microbiology No results found for this or any previous visit (from the past 240 hour(s)).   Time coordinating discharge: 35 minutes  SIGNED: Antonieta Pert, MD  [facility] 06/20/2020, 4:48 PM  If 7PM-7AM, please contact night-coverage [email or web address]

## 2020-06-20 NOTE — Progress Notes (Signed)
Physical Therapy Treatment Patient Details Name: Tony Marshall MRN: [id number] DOB: 1941/05/19 Today's Date: 06/20/2020    History of Present Illness 79 year old with CLL on Rituxan, [place], diabetes, hypertension, hyperlipidemia admitted with diarrhea, malaise, mild DKA requiring insulin drip. Labs significant for pancytopenia, mild hyperbilirubinemia, electrolyte abnormalities.  Transferred to ICU on 6/22 for hypotension, sepsis with elevated lactic acid.  Pt also with RLE cellulitis and sepsis - onset 6/23 following RLE leg trauma in ER. DVT negative    PT Comments    Pt ambulated in hallway short distance and required seated rest break.  Pt encouraged to stay up in recliner end of session.  Continue to recommend SNF upon d/c.   Follow Up Recommendations  SNF;Supervision/Assistance - 24 hour     Equipment Recommendations  Rolling walker with 5" wheels;3in1 (PT)    Recommendations for Other Services       Precautions / Restrictions Precautions Precautions: Fall Precaution Comments: monitor HR    Mobility  Bed Mobility Overal bed mobility: Needs Assistance Bed Mobility: Supine to Sit     Supine to sit: Supervision     General bed mobility comments: increased time  Transfers Overall transfer level: Needs assistance Equipment used: Rolling walker (2 wheeled) Transfers: Sit to/from Stand Sit to Stand: Min guard         General transfer comment: min/guard for safety, cues for hand placement  Ambulation/Gait Ambulation/Gait assistance: Min guard;Min assist Gait Distance (Feet): 30 Feet (x2) Assistive device: Rolling walker (2 wheeled) Gait Pattern/deviations: Decreased stride length;Decreased stance time - right;Antalgic Gait velocity: decreased   General Gait Details: requiring slight assist with fatigue, required seated rest break (30 feet x2), HR 116-123 bpm, SPO2 97-98%   Stairs             Wheelchair Mobility    Modified Rankin (Stroke  Patients Only)       Balance                                            Cognition Arousal/Alertness: Awake/alert Behavior During Therapy: WFL for tasks assessed/performed;Flat affect Overall Cognitive Status: Within Functional Limits for tasks assessed                                        Exercises      General Comments        Pertinent Vitals/Pain Pain Assessment: 0-10 Pain Score: 3  Pain Location: R LE with weight bearing Pain Descriptors / Indicators: Sore Pain Intervention(s): Repositioned;Monitored during session    Home Living                      Prior Function            PT Goals (current goals can now be found in the care plan section) Progress towards PT goals: Progressing toward goals    Frequency    Min 2X/week      PT Plan Current plan remains appropriate    Co-evaluation              AM-PAC PT "6 Clicks" Mobility   Outcome Measure  Help needed turning from your back to your side while in a flat bed without using bedrails?: A Little Help needed moving from lying on your  back to sitting on the side of a flat bed without using bedrails?: A Little Help needed moving to and from a bed to a chair (including a wheelchair)?: A Little Help needed standing up from a chair using your arms (e.g., wheelchair or bedside chair)?: A Little Help needed to walk in hospital room?: A Little Help needed climbing 3-5 steps with a railing? : A Lot 6 Click Score: 17    End of Session Equipment Utilized During Treatment: Gait belt Activity Tolerance: Patient tolerated treatment well Patient left: with call bell/phone within reach;with family/visitor present;with chair alarm set;in chair Nurse Communication: Mobility status PT Visit Diagnosis: Other abnormalities of gait and mobility (R26.89);Pain Pain - Right/Left: Right Pain - part of body: Leg     Time: 1025-8527 PT Time Calculation (min) (ACUTE ONLY):  13 min  Charges:  $Gait Training: 8-22 mins                    Arlyce Dice, DPT Acute Rehabilitation Services Pager: [phone] Office: [phone]  York Ram E 06/20/2020, 3:13 PM

## 2020-06-20 NOTE — Consult Note (Signed)
   [facility] Inpatient Consult   06/20/2020  Tony Marshall [birth year] [id number]   Patient chart reviewed for [facility] CM) needs due to high unplanned readmission risk score. Per review, current disposition plan is for SNF. No [facility] CM needs.  Netta Cedars, MSN, [facility] Liaison Nurse Mobile Phone [phone]  Toll free office [phone]

## 2020-06-20 NOTE — Consult Note (Signed)
[facility]  Total days of antibiotics 15               Reason for Consult: cellulitis   Referring Physician: Antonieta Pert  Active Problems:   Increased anion gap metabolic acidosis   Altered mental status   Failure to thrive in adult   Symptomatic anemia   Acute respiratory failure (HCC)   Hyperglycemia due to diabetes mellitus (HCC)   Leukopenia   Acute combined systolic and diastolic heart failure (HCC)   Cardiogenic shock (HCC)   Malnutrition of moderate degree    HPI: Tony Marshall is a 79 y.o. male with hx of DM, CLL progression noted in jan 2021, on rutixan followed by dr Inda Merlin. He was admitted on 6/21 with severe dehydration, refractory diarrhea, weakness, poor appetite with 17 lbweight loss. He had last rituxan infusion roughly on 6/15. He was found to have fever, toxic metabolic encephalopathy, early DKA with AMS, where he received IVF, neo-synephrine initially, and started on vanco and cefepime for febrile neutropenia( Fever of 103, wbc of 1.9). he improved steadily, on exam, though his spouse reports that she had not noticed, he developed a very tender erythematous indurated right calf. His abtx were narrowed to cefazolin for cellulitis on 6/26. Photos show a diffuse large area of erythema that has improved somewhat. Has clearer margins but now has 4 nodular like deep red raised lesions, still tender despite finishing 15 days of abtx. ID asked to weigh in on next course of therapy. The patient denies any gardening, scratches to his right leg, they have small dog, but has not scratched or bitten the patient. U/S ruled out DVT.     Past Medical History:  Diagnosis Date  . CLL (chronic lymphocytic leukemia) ([place]) dx'd 2014   progression 12/2019  . Diabetes mellitus without complication ([place])   . Diverticulosis   . External hemorrhoids   . GERD (gastroesophageal reflux disease)   . Hiatal hernia   . History of colon polyps   .  Hyperlipidemia   . Hypertension   . Internal hemorrhoids   . Lymphocytosis   . Schatzki's ring   . Sleep apnea     Allergies:  Allergies  Allergen Reactions  . Glycopyrrolate Rash    Current antibiotics:   MEDICATIONS: . acetaminophen  650 mg Oral Once  . carvedilol  12.5 mg Oral BID WC  . Chlorhexidine Gluconate Cloth  6 each Topical Daily  . enoxaparin (LOVENOX) injection  40 mg Subcutaneous Q24H  . feeding supplement (GLUCERNA SHAKE)  237 mL Oral TID WC  . folic acid  1 mg Oral Daily  . insulin aspart  0-9 Units Subcutaneous TID WC  . insulin glargine  7 Units Subcutaneous Daily  . multivitamin with minerals  1 tablet Oral Daily  . pantoprazole  40 mg Oral Daily  . potassium & sodium phosphates  1 packet Oral TID WC & HS  . protein supplement  12 g Oral TID WC  . rosuvastatin  5 mg Oral QHS  . sacubitril-valsartan  1 tablet Oral BID  . senna  1 tablet Oral Daily  . sodium chloride flush  10-40 mL Intracatheter Q12H  . thiamine  100 mg Oral Daily    Social History   Tobacco Use  . Smoking status: Never Smoker  . Smokeless tobacco: Former Systems developer    Types: Secondary school teacher  . Vaping Use: Never used  Substance Use Topics  . Alcohol  use: Yes    Alcohol/week: 6.0 standard drinks    Types: 6 Cans of beer per week  . Drug use: No    Family History  Problem Relation Age of Onset  . Kidney disease Mother   . CAD Maternal Aunt   . Colon cancer Neg Hx   . Esophageal cancer Neg Hx   . Rectal cancer Neg Hx   . Stomach cancer Neg Hx      Review of Systems  Constitutional: Negative for fever, chills, diaphoresis, activity change, appetite change, fatigue and unexpected weight change.  HENT: Negative for congestion, sore throat, rhinorrhea, sneezing, trouble swallowing and sinus pressure.  Eyes: Negative for photophobia and visual disturbance.  Respiratory: Negative for cough, chest tightness, shortness of breath, wheezing and stridor.  Cardiovascular: Negative for  chest pain, palpitations and leg swelling.  Gastrointestinal: Negative for nausea, vomiting, abdominal pain, diarrhea, constipation, blood in stool, abdominal distention and anal bleeding.  Genitourinary: Negative for dysuria, hematuria, flank pain and difficulty urinating.  Musculoskeletal: Negative for myalgias, back pain, joint swelling, arthralgias and gait problem.  Skin: Negative for color change, pallor, rash and wound.  Neurological: Negative for dizziness, tremors, weakness and light-headedness.  Hematological: Negative for adenopathy. Does not bruise/bleed easily.  Psychiatric/Behavioral: Negative for behavioral problems, confusion, sleep disturbance, dysphoric mood, decreased concentration and agitation.     OBJECTIVE: Temp:  [98 F (36.7 C)-98.4 F (36.9 C)] 98 F (36.7 C) (07/06 1121) Pulse Rate:  [81-93] 93 (07/06 1121) Resp:  [18-20] 20 (07/06 1121) BP: (139-149)/(75-83) 139/75 (07/06 1121) SpO2:  [97 %-99 %] 99 % (07/06 1121) Physical Exam  Constitutional: He is oriented to person, place, and time. He appears gaunt and well-nourished. No distress.  HENT:  Mouth/Throat: Oropharynx is clear and moist. No oropharyngeal exudate.  Cardiovascular: Normal rate, regular rhythm and normal heart sounds. Exam reveals no gallop and no friction rub.  No murmur heard.  Pulmonary/Chest: Effort normal and breath sounds normal. No respiratory distress. He has no wheezes.  Abdominal: Soft. Bowel sounds are normal. He exhibits no distension. There is no tenderness.  Lymphadenopathy:  He has no cervical adenopathy.  Neurological: He is alert and oriented to person, place, and time.  Skin: Skin is warm and dry. No rash noted. No erythema.  Psychiatric: He has a normal mood and affect. His behavior is normal.     LABS: Results for orders placed or performed during the hospital encounter of 06/05/20 (from the past 48 hour(s))  Glucose, capillary     Status: Abnormal   Collection Time:  06/18/20  4:47 PM  Result Value Ref Range   Glucose-Capillary 52 (L) 70 - 99 mg/dL    Comment: Glucose reference range applies only to samples taken after fasting for at least 8 hours.  Glucose, capillary     Status: None   Collection Time: 06/18/20  5:30 PM  Result Value Ref Range   Glucose-Capillary 85 70 - 99 mg/dL    Comment: Glucose reference range applies only to samples taken after fasting for at least 8 hours.  Glucose, capillary     Status: None   Collection Time: 06/18/20  9:40 PM  Result Value Ref Range   Glucose-Capillary 97 70 - 99 mg/dL    Comment: Glucose reference range applies only to samples taken after fasting for at least 8 hours.  Glucose, capillary     Status: Abnormal   Collection Time: 06/19/20  2:02 AM  Result Value Ref Range   Glucose-Capillary  184 (H) 70 - 99 mg/dL    Comment: Glucose reference range applies only to samples taken after fasting for at least 8 hours.  CBC with Differential     Status: Abnormal   Collection Time: 06/19/20  3:58 AM  Result Value Ref Range   WBC 3.1 (L) 4.0 - 10.5 K/uL   RBC 2.80 (L) 4.22 - 5.81 MIL/uL   Hemoglobin 8.6 (L) 13.0 - 17.0 g/dL   HCT 26.8 (L) 39 - 52 %   MCV 95.7 80.0 - 100.0 fL   MCH 30.7 26.0 - 34.0 pg   MCHC 32.1 30.0 - 36.0 g/dL   RDW 17.1 (H) 11.5 - 15.5 %   Platelets 132 (L) 150 - 400 K/uL    Comment: Immature Platelet Fraction may be clinically indicated, consider ordering this additional test [id number] REPEATED TO VERIFY    nRBC 0.0 0.0 - 0.2 %   Neutrophils Relative % 79 %   Neutro Abs 2.5 1.7 - 7.7 K/uL   Lymphocytes Relative 15 %   Lymphs Abs 0.5 (L) 0.7 - 4.0 K/uL   Monocytes Relative 6 %   Monocytes Absolute 0.2 0 - 1 K/uL   Eosinophils Relative 0 %   Eosinophils Absolute 0.0 0 - 0 K/uL   Basophils Relative 0 %   Basophils Absolute 0.0 0 - 0 K/uL   Immature Granulocytes 0 %   Abs Immature Granulocytes 0.01 0.00 - 0.07 K/uL    Comment: Performed at [facility], [place]  [address], [place], [place] [address]  Glucose, capillary     Status: Abnormal   Collection Time: 06/19/20  7:38 AM  Result Value Ref Range   Glucose-Capillary 227 (H) 70 - 99 mg/dL    Comment: Glucose reference range applies only to samples taken after fasting for at least 8 hours.  Glucose, capillary     Status: Abnormal   Collection Time: 06/19/20 11:46 AM  Result Value Ref Range   Glucose-Capillary 180 (H) 70 - 99 mg/dL    Comment: Glucose reference range applies only to samples taken after fasting for at least 8 hours.  Glucose, capillary     Status: Abnormal   Collection Time: 06/19/20  5:44 PM  Result Value Ref Range   Glucose-Capillary 136 (H) 70 - 99 mg/dL    Comment: Glucose reference range applies only to samples taken after fasting for at least 8 hours.  Glucose, capillary     Status: Abnormal   Collection Time: 06/19/20  9:38 PM  Result Value Ref Range   Glucose-Capillary 178 (H) 70 - 99 mg/dL    Comment: Glucose reference range applies only to samples taken after fasting for at least 8 hours.  CBC with Differential     Status: Abnormal   Collection Time: 06/20/20  4:27 AM  Result Value Ref Range   WBC 3.5 (L) 4.0 - 10.5 K/uL   RBC 2.75 (L) 4.22 - 5.81 MIL/uL   Hemoglobin 8.5 (L) 13.0 - 17.0 g/dL   HCT 26.0 (L) 39 - 52 %   MCV 94.5 80.0 - 100.0 fL   MCH 30.9 26.0 - 34.0 pg   MCHC 32.7 30.0 - 36.0 g/dL   RDW 17.3 (H) 11.5 - 15.5 %   Platelets 138 (L) 150 - 400 K/uL    Comment: Immature Platelet Fraction may be clinically indicated, consider ordering this additional test [id number]    nRBC 0.0 0.0 - 0.2 %   Neutrophils Relative % 77 %  Neutro Abs 2.8 1.7 - 7.7 K/uL   Lymphocytes Relative 12 %   Lymphs Abs 0.4 (L) 0.7 - 4.0 K/uL   Monocytes Relative 9 %   Monocytes Absolute 0.3 0 - 1 K/uL   Eosinophils Relative 0 %   Eosinophils Absolute 0.0 0 - 0 K/uL   Basophils Relative 1 %   Basophils Absolute 0.0 0 - 0 K/uL   Immature Granulocytes 1 %   Abs Immature  Granulocytes 0.03 0.00 - 0.07 K/uL    Comment: Performed at [facility], [place] [address] [place],  [address]  Glucose, capillary     Status: Abnormal   Collection Time: 06/20/20  7:35 AM  Result Value Ref Range   Glucose-Capillary 264 (H) 70 - 99 mg/dL    Comment: Glucose reference range applies only to samples taken after fasting for at least 8 hours.  Glucose, capillary     Status: Abnormal   Collection Time: 06/20/20 11:19 AM  Result Value Ref Range   Glucose-Capillary 248 (H) 70 - 99 mg/dL    Comment: Glucose reference range applies only to samples taken after fasting for at least 8 hours.    MICRO: Reviewed- all negative IMAGING: No results found.  HISTORICAL MICRO/IMAGING  Assessment/Plan:  79yo M with sepsis now finished 14 days of abtx for cellulitis, but skin lesions to right calf still not completely improved, somewhat more localized. Concern for vasculitis, leukemia cutis but also possible fungal infection since patient is immunocompromised  - recommend 7 more days of cephalexin 500mg  QID.  - will need tissue biopsy listed below - will see back in the ID clinic in 7-10 days to see if needs to do additional oral abtx vs. steroids  I made appointment with plastics to do tissue biopsy for path and aerobic and fungal culture -- [address], dr Veterinary surgeon pace

## 2020-06-20 NOTE — TOC Progression Note (Signed)
Transition of Care [facility]) - Progression Note    Patient Details  Name: HENRIK ORIHUELA MRN: [id number] Date of Birth: 1941/08/20  Transition of Care [facility]) CM/SW Contact  Purcell Mouton, RN Phone Number: 06/20/2020, 9:58 AM  Clinical Narrative:     Spoke with pt's wife concerning SNF. [facility] is not accepting pt's and [facility] is out of network with [employer]. Pt was re-faxed to other SNF's. Wife understands that she will need to make a decision today related to SNF.   Expected Discharge Plan: [place] Barriers to Discharge: Continued Medical Work up  Expected Discharge Plan and Services Expected Discharge Plan: [facility]   Discharge Planning Services: CM Consult Post Acute Care Choice: [place] arrangements for the past 2 months: Single Family Home                 DME Arranged: N/A DME Agency: NA       HH Arranged: PT, OT HH Agency: [facility] Date [place]: 06/13/20 Time Luis M. [place]: 1120 Representative spoke with at [place]: [place]   Social Determinants of Health ([place]) Interventions    Readmission Risk Interventions No flowsheet data found.

## 2020-06-21 ENCOUNTER — Telehealth: Payer: Self-pay | Admitting: Medical Oncology

## 2020-06-21 ENCOUNTER — Encounter: Payer: Self-pay | Admitting: Plastic Surgery

## 2020-06-21 ENCOUNTER — Other Ambulatory Visit: Payer: Self-pay

## 2020-06-21 ENCOUNTER — Other Ambulatory Visit (HOSPITAL_COMMUNITY)
Admission: RE | Admit: 2020-06-21 | Discharge: 2020-06-21 | Disposition: A | Discharge status: Home / Self Care | Payer: Medicare HMO | Source: Ambulatory Visit | Attending: Plastic Surgery | Admitting: Plastic Surgery

## 2020-06-21 ENCOUNTER — Ambulatory Visit (INDEPENDENT_AMBULATORY_CARE_PROVIDER_SITE_OTHER): Payer: Medicare HMO | Admitting: Plastic Surgery

## 2020-06-21 ENCOUNTER — Other Ambulatory Visit: Payer: Self-pay | Admitting: Internal Medicine

## 2020-06-21 VITALS — BP 155/83 | HR 94 | Temp 98.1°F

## 2020-06-21 DIAGNOSIS — L989 Disorder of the skin and subcutaneous tissue, unspecified: Secondary | ICD-10-CM | POA: Diagnosis not present

## 2020-06-21 DIAGNOSIS — I96 Gangrene, not elsewhere classified: Secondary | ICD-10-CM | POA: Diagnosis not present

## 2020-06-21 MED ORDER — TRAMADOL HCL 50 MG PO TABS: 50.0000 mg | ORAL_TABLET | Freq: Two times a day (BID) | ORAL | 0 refills | Status: DC | PRN

## 2020-06-21 MED ORDER — ALPRAZOLAM 0.25 MG PO TABS: 0.2500 mg | ORAL_TABLET | Freq: Every evening | ORAL | 0 refills | Status: AC | PRN

## 2020-06-21 NOTE — Telephone Encounter (Signed)
Noted, I have not seen any referral for physical therapy as yet.  Please remind the patient to schedule his post hospital visit within 2 weeks, thanks.  Also he has been asked to establish with a PCP

## 2020-06-21 NOTE — Telephone Encounter (Signed)
Garner was discharged from [facility] yesterday .  Medication management- Wife stated the hospitalist told her to get in touch with [facility] for the following prescriptions that  he was prescribed in the hospital:   -Xanax 0.5 mh hs ('it helped him rest at night") -Tramadol (" pain for his cellulitus ")   [facility] - she is picking up prescription today   F/u with [facility] on [facility]/13.  [facility]

## 2020-06-21 NOTE — Telephone Encounter (Signed)
It should have been prescribed by the primary care physician but I gave him one-time refill this time.  Also referral to physical therapy should come from the primary care physician.

## 2020-06-21 NOTE — TOC Progression Note (Signed)
Transition of Care [facility]) - Progression Note    Patient Details  Name: Tony Marshall MRN: [id number] Date of Birth: [birth year]  Transition of Care [facility]) CM/SW Contact  Purcell Mouton, RN Phone Number: 06/21/2020, 12:39 PM  Clinical Narrative:    Opened discharge chart related to wife calling to ask for 3 scripts that was not at  Pharmacy. MD was made aware[facility] [place]         Expected Discharge Plan and Services                                                 Social Determinants of Health (SDOH) Interventions    Readmission Risk Interventions No flowsheet data found.

## 2020-06-21 NOTE — Progress Notes (Signed)
Referring Provider Elayne Snare, MD [place],  [place] [address]   CC:  Chief Complaint  Patient presents with  . Advice Only      Tony Marshall is an 79 y.o. male.  HPI: Patient presents with request from ID physician for biopsy of right calf.  He's been sick and in hospital until yesterday discharge.  Multiple issues with CLL, deconditioning, CHF, DKA. Pressors, and RLE cellulitis that is improving.  There were some subcutaneous nodules that patient can no longer feel.  He has been on abx.  Concern is for something other than cellulitis to help guide treatment.  Allergies  Allergen Reactions  . Glycopyrrolate Rash    Outpatient Encounter Medications as of 06/21/2020  Medication Sig Note  . acetaminophen (TYLENOL) 500 MG tablet Take 500 mg by mouth every 6 (six) hours as needed for moderate pain.   Marland Kitchen allopurinol (ZYLOPRIM) 100 MG tablet Take 1 tablet (100 mg total) by mouth 2 (two) times daily.   Marland Kitchen aspirin 81 MG EC tablet Take 81 mg by mouth daily. Swallow whole.   . carvedilol (COREG) 12.5 MG tablet Take 1 tablet (12.5 mg total) by mouth 2 (two) times daily with a meal.   . cephALEXin (KEFLEX) 500 MG capsule Take 1 capsule (500 mg total) by mouth 4 (four) times daily for 7 days.   Marland Kitchen dronabinol (MARINOL) 2.5 MG capsule Take 1 capsule (2.5 mg total) by mouth 2 (two) times daily before a meal.   . esomeprazole (NEXIUM) 40 MG capsule TAKE 1 CAPSULE BY MOUTH EVERY MORNING 30 MINUTES BEFORE BREAKFAST (Patient taking differently: Take 40 mg by mouth daily. )   . feeding supplement, GLUCERNA SHAKE, (GLUCERNA SHAKE) LIQD Take 237 mLs by mouth 3 (three) times daily with meals.   . folic acid (FOLVITE) 1 MG tablet Take 1 tablet (1 mg total) by mouth daily.   Marland Kitchen glucagon (GLUCAGON EMERGENCY) 1 MG injection Inject 1 mg into the skin once as needed (For severe low blood sugar).   . insulin aspart (NOVOLOG FLEXPEN) 100 UNIT/ML FlexPen Inject 3-4 Units into the skin 3  (three) times daily with meals. Inject 3-4 units under the skin three times daily before meals.    . insulin degludec (TRESIBA FLEXTOUCH) 100 UNIT/ML FlexTouch Pen inject 7 units daily. if fasting blood sugars are over 140 increase dosage up to 15 units daily as directed.   . loperamide (IMODIUM A-D) 2 MG tablet Take 2 mg by mouth 4 (four) times daily as needed for diarrhea or loose stools.   . metFORMIN (GLUCOPHAGE-XR) 500 MG 24 hr tablet Take 1 tablet (500 mg total) by mouth daily with supper. 06/05/2020: On Hold.  Glory Rosebush VERIO test strip USE 1 STRIP TO CHECK GLUCOSE THREE TIMES DAILY   . oxymetazoline (AFRIN) 0.05 % nasal spray Place 1 spray into both nostrils 2 (two) times daily as needed for congestion.   . potassium chloride SA (KLOR-CON) 20 MEQ tablet Take 1 tablet (20 mEq total) by mouth 2 (two) times daily.   . prochlorperazine (COMPAZINE) 10 MG tablet Take 1 tablet (10 mg total) by mouth every 6 (six) hours as needed for nausea or vomiting.   . protein supplement (RESOURCE BENEPROTEIN) 6 g POWD Take 2 Scoops (12 g total) by mouth 3 (three) times daily with meals for 7 days.   . rosuvastatin (CRESTOR) 5 MG tablet TAKE 1 TABLET BY MOUTH EVERY DAY   . sacubitril-valsartan (ENTRESTO) 24-26  MG Take 1 tablet by mouth 2 (two) times daily.   Marland Kitchen senna (SENOKOT) 8.6 MG TABS tablet Take 1 tablet (8.6 mg total) by mouth daily.   Marland Kitchen thiamine 100 MG tablet Take 1 tablet (100 mg total) by mouth daily.   . traMADol (ULTRAM) 50 MG tablet Take 1 tablet (50 mg total) by mouth every 12 (twelve) hours as needed.   . ALPRAZolam (XANAX) 0.25 MG tablet Take 1 tablet (0.25 mg total) by mouth at bedtime as needed for anxiety. (Patient not taking: Reported on 06/21/2020)    No facility-administered encounter medications on file as of 06/21/2020.     Past Medical History:  Diagnosis Date  . CLL (chronic lymphocytic leukemia) ([place]) dx'd 2014   progression 12/2019  . Diabetes mellitus without complication ([place])   .  Diverticulosis   . External hemorrhoids   . GERD (gastroesophageal reflux disease)   . Hiatal hernia   . History of colon polyps   . Hyperlipidemia   . Hypertension   . Internal hemorrhoids   . Lymphocytosis   . Schatzki's ring   . Sleep apnea     Past Surgical History:  Procedure Laterality Date  . BLEPHAROPLASTY    . COLONOSCOPY  2012  . HEMORRHOID SURGERY     Sclerotherapy  . KNEE SURGERY Right 2002  . POLYPECTOMY    . SHOULDER SURGERY Right 2007    Family History  Problem Relation Age of Onset  . Kidney disease Mother   . CAD Maternal Aunt   . Colon cancer Neg Hx   . Esophageal cancer Neg Hx   . Rectal cancer Neg Hx   . Stomach cancer Neg Hx     Social History   Social History Narrative  . Not on file     Review of Systems General: Denies fevers, chills, weight loss CV: Denies chest pain, shortness of breath, palpitations  Physical Exam Vitals with BMI 06/21/2020 06/20/2020 06/20/2020  Height - - -  Weight (No Data) - -  BMI - - -  Systolic [phone]  Diastolic 83 75 77  Pulse 94 93 89    General:  No acute distress,  Alert and oriented, Non-Toxic, Normal speech and affect RLE with diffuse cellulitis posterior calf.  Pitting edema up to knee.  No wounds.  No fluctuance.   Assessment/Plan Suspected RLE cellulitis that is slow to clear due to other medical issues.  Can do punch biopsy to look for additional information that may help guide therapy.  Discussed risks and benefits.  Area of posterior calf was anesthetized with lidocaine and prepped sterily.  3mm punch used for full thickness biopsy.  Area closed with single 4-0 monocryl suture.  Explained care to patient and will follow up as needed.  If anything alarming on pathology I will call him.  Cindra Presume 06/21/2020, 5:52 PM

## 2020-06-22 ENCOUNTER — Encounter: Payer: Self-pay | Admitting: Internal Medicine

## 2020-06-22 ENCOUNTER — Telehealth: Payer: Self-pay | Admitting: Medical Oncology

## 2020-06-22 ENCOUNTER — Other Ambulatory Visit: Payer: Self-pay | Admitting: Medical Oncology

## 2020-06-22 DIAGNOSIS — R197 Diarrhea, unspecified: Secondary | ICD-10-CM

## 2020-06-22 NOTE — Telephone Encounter (Signed)
Pt's wife elected to change pt's appt to 3:30pm on 06/26/20.

## 2020-06-22 NOTE — Telephone Encounter (Signed)
Diarrhea- Tony Marshall has had 6 episodes of  diarrhea daily since tuesday . She describes it as black with a foul odor. The stool was watery ,but now it is mushy and black "as ACE of spade".  She has been giving him 2 tablets up to 8 tablets since yesterday because 4 tablets a day were not slowing it down.  Instructed wife to take him to ED for dizziness, increase in black stools or blood in stool.  Schedule message sent for [facility] tomorrow.

## 2020-06-22 NOTE — Telephone Encounter (Signed)
Please enter appointment

## 2020-06-22 NOTE — Telephone Encounter (Signed)
Pt's wife was contacted and hospital f/u visit was schedule for 06/26/20.

## 2020-06-23 ENCOUNTER — Inpatient Hospital Stay: Payer: Medicare HMO

## 2020-06-23 ENCOUNTER — Other Ambulatory Visit: Payer: Self-pay | Admitting: Emergency Medicine

## 2020-06-23 ENCOUNTER — Other Ambulatory Visit: Payer: Self-pay

## 2020-06-23 ENCOUNTER — Inpatient Hospital Stay: Payer: Medicare HMO | Attending: Internal Medicine | Admitting: Medical

## 2020-06-23 ENCOUNTER — Encounter: Payer: Self-pay | Admitting: Medical

## 2020-06-23 ENCOUNTER — Ambulatory Visit: Payer: Medicare HMO | Admitting: Endocrinology

## 2020-06-23 ENCOUNTER — Telehealth: Payer: Self-pay | Admitting: Emergency Medicine

## 2020-06-23 ENCOUNTER — Ambulatory Visit: Payer: Medicare HMO

## 2020-06-23 ENCOUNTER — Other Ambulatory Visit: Payer: Self-pay | Admitting: Medical Oncology

## 2020-06-23 VITALS — BP 155/63 | HR 91 | Temp 98.1°F | Resp 20 | Ht 68.5 in

## 2020-06-23 DIAGNOSIS — R197 Diarrhea, unspecified: Secondary | ICD-10-CM

## 2020-06-23 DIAGNOSIS — E860 Dehydration: Secondary | ICD-10-CM | POA: Diagnosis not present

## 2020-06-23 DIAGNOSIS — C9110 Chronic lymphocytic leukemia of B-cell type not having achieved remission: Secondary | ICD-10-CM | POA: Diagnosis not present

## 2020-06-23 DIAGNOSIS — K591 Functional diarrhea: Secondary | ICD-10-CM | POA: Diagnosis not present

## 2020-06-23 DIAGNOSIS — E790 Hyperuricemia without signs of inflammatory arthritis and tophaceous disease: Secondary | ICD-10-CM | POA: Insufficient documentation

## 2020-06-23 DIAGNOSIS — M5441 Lumbago with sciatica, right side: Secondary | ICD-10-CM | POA: Diagnosis not present

## 2020-06-23 DIAGNOSIS — E119 Type 2 diabetes mellitus without complications: Secondary | ICD-10-CM | POA: Diagnosis not present

## 2020-06-23 DIAGNOSIS — R5383 Other fatigue: Secondary | ICD-10-CM | POA: Diagnosis not present

## 2020-06-23 DIAGNOSIS — Z794 Long term (current) use of insulin: Secondary | ICD-10-CM | POA: Diagnosis not present

## 2020-06-23 DIAGNOSIS — R531 Weakness: Secondary | ICD-10-CM | POA: Diagnosis not present

## 2020-06-23 DIAGNOSIS — E878 Other disorders of electrolyte and fluid balance, not elsewhere classified: Secondary | ICD-10-CM | POA: Insufficient documentation

## 2020-06-23 DIAGNOSIS — R630 Anorexia: Secondary | ICD-10-CM | POA: Diagnosis not present

## 2020-06-23 DIAGNOSIS — R634 Abnormal weight loss: Secondary | ICD-10-CM | POA: Diagnosis not present

## 2020-06-23 DIAGNOSIS — D649 Anemia, unspecified: Secondary | ICD-10-CM | POA: Diagnosis not present

## 2020-06-23 LAB — CBC WITH DIFFERENTIAL (CANCER CENTER ONLY)
Abs Immature Granulocytes: 0.04 10*3/uL (ref 0.00–0.07)
Basophils Absolute: 0 10*3/uL (ref 0.0–0.1)
Basophils Relative: 1 %
Eosinophils Absolute: 0 10*3/uL (ref 0.0–0.5)
Eosinophils Relative: 0 %
HCT: 24.4 % — ABNORMAL LOW (ref 39.0–52.0)
Hemoglobin: 8 g/dL — ABNORMAL LOW (ref 13.0–17.0)
Immature Granulocytes: 1 %
Lymphocytes Relative: 16 %
Lymphs Abs: 0.5 10*3/uL — ABNORMAL LOW (ref 0.7–4.0)
MCH: 31.3 pg (ref 26.0–34.0)
MCHC: 32.8 g/dL (ref 30.0–36.0)
MCV: 95.3 fL (ref 80.0–100.0)
Monocytes Absolute: 0.2 10*3/uL (ref 0.1–1.0)
Monocytes Relative: 7 %
Neutro Abs: 2.5 10*3/uL (ref 1.7–7.7)
Neutrophils Relative %: 75 %
Platelet Count: 110 10*3/uL — ABNORMAL LOW (ref 150–400)
RBC: 2.56 MIL/uL — ABNORMAL LOW (ref 4.22–5.81)
RDW: 17.6 % — ABNORMAL HIGH (ref 11.5–15.5)
WBC Count: 3.3 10*3/uL — ABNORMAL LOW (ref 4.0–10.5)
nRBC: 0 % (ref 0.0–0.2)

## 2020-06-23 LAB — CMP (CANCER CENTER ONLY)
ALT: <6 U/L (ref 0–44)
AST: 13 U/L — ABNORMAL LOW (ref 15–41)
Albumin: 2.2 g/dL — ABNORMAL LOW (ref 3.5–5.0)
Alkaline Phosphatase: 79 U/L (ref 38–126)
Anion gap: 14 (ref 5–15)
BUN: 12 mg/dL (ref 8–23)
CO2: 24 mmol/L (ref 22–32)
Calcium: 7.9 mg/dL — ABNORMAL LOW (ref 8.9–10.3)
Chloride: 100 mmol/L (ref 98–111)
Creatinine: 0.71 mg/dL (ref 0.61–1.24)
GFR, Est AFR Am: >60 mL/min (ref >60)
GFR, Estimated: >60 mL/min (ref >60)
Glucose, Bld: 291 mg/dL — ABNORMAL HIGH (ref 70–99)
Potassium: 3.5 mmol/L (ref 3.5–5.1)
Sodium: 138 mmol/L (ref 135–145)
Total Bilirubin: 0.7 mg/dL (ref 0.3–1.2)
Total Protein: 4.5 g/dL — ABNORMAL LOW (ref 6.5–8.1)

## 2020-06-23 LAB — URIC ACID: Uric Acid, Serum: <2 mg/dL — ABNORMAL LOW (ref 3.7–8.6)

## 2020-06-23 LAB — SAMPLE TO BLOOD BANK

## 2020-06-23 LAB — LACTATE DEHYDROGENASE: LDH: 275 U/L — ABNORMAL HIGH (ref 98–192)

## 2020-06-23 LAB — MAGNESIUM: Magnesium: 1.3 mg/dL — CL (ref 1.7–2.4)

## 2020-06-23 MED ORDER — OXYCODONE HCL 5 MG PO TABS: 5.0000 mg | ORAL_TABLET | ORAL | 0 refills | Status: DC | PRN

## 2020-06-23 MED ORDER — DIPHENOXYLATE-ATROPINE 2.5-0.025 MG PO TABS
ORAL_TABLET | ORAL | 1 refills | Status: DC
Start: 2020-06-23 — End: 2020-07-11

## 2020-06-23 MED ORDER — SODIUM CHLORIDE 0.9 % IV SOLN: 6.0000 g | Freq: Once | INTRAVENOUS | Status: DC

## 2020-06-23 NOTE — Progress Notes (Signed)
Critical lab value received 1422: Magnesium 1.3 PA Lucianne Lei made aware.  Contacted pt's wife regarding results.  Per PA Lucianne Lei pt to receive IVF with magnesium only tomorrow and to come back Monday for 1 unit PRBCs.  Agreed to change in plan of care.  High priority scheduling message sent, pt's wife aware to expect call to schedule 1 unit blood on Monday.  Orders for transfusion and IVF w/magnesium placed.  Pt aware to keep blue blood bracelet on until Monday.

## 2020-06-23 NOTE — Telephone Encounter (Signed)
Called to verify appt times today for labs/[facility], pt's wife aware.

## 2020-06-23 NOTE — Progress Notes (Signed)
[facility] Progress Note   DIONISIO ARAGONES [id number] 1941-04-17 79 y.o.  JAMONTAE THWAITES is managed by Dr. Fanny Bien. Mohamed  Actively treated with chemotherapy/immunotherapy/hormonal therapy: yes  Current therapy: Rituximab and Bendeka  Last treated: 05/30/2020 (cycle 2)  Next scheduled appointment with provider: 06/27/2020  Assessment: Plan:    Functional diarrhea - Plan: Magnesium, diphenoxylate-atropine (LOMOTIL) 2.5-0.025 MG tablet  Hypomagnesemia - Plan: Magnesium, magnesium sulfate 6 g in sodium chloride 0.9 % 500 mL, DISCONTINUED: magnesium sulfate 6 g in sodium chloride 0.9 % 500 mL  Acute midline low back pain with right-sided sciatica - Plan: oxyCODONE (OXY IR/ROXICODONE) 5 MG immediate release tablet  Symptomatic anemia - Plan: Informed Consent Details: Physician/Practitioner Attestation; Transcribe to consent form and obtain patient signature, Type and screen, Care order/instruction, 0.9 %  sodium chloride infusion (Manually program via Guardrails IV Fluids), heparin lock flush 100 unit/mL, Prepare RBC (crossmatch), Transfuse RBC, acetaminophen (TYLENOL) tablet 650 mg, Type and screen  CLL (chronic lymphocytic leukemia) ([place])   Diarrhea with hypomagnesemia: The patient's magnesium level was 1.3 today.  He will return tomorrow for 6 g of magnesium sulfate IV x1.  I did not give the patient oral magnesium for this evening due to his ongoing diarrhea. The patient's wife was contacted and was told to call 911 or present to the emergency room should he have palpitations or chest discomfort this evening. According to Mr. Guardia the, Mr. Vansickle was not eager to go to the emergency room unless absolutely necessary.  Chronic lymphocytic leukemia: The patient is status post cycle 2 of rituximab and Bendeka which was given on 05/30/2020.  He is scheduled to be seen in follow-up on 06/27/2020.  Diabetes: The patient's blood sugar returned today at 291.   He is currently on Actos 15 mg, Metformin 500 mg once daily and NovoLog 3 to 4 units 3 times daily.  Hypouricemia: A chemistry panel returned with a uric acid less than 2.  Review of literature shows that this may be inconsequential but will be rechecked at a follow-up appointment.  Symptomatic anemia: A CBC returned today with a hemoglobin of 8.  Plans were to have the patient return tomorrow for 1 unit of packed red blood cells however after it was determined that he had hypomagnesemia it was decided that he would return tomorrow for magnesium sulfate and would delay a transfusion until he is seen next week by Dr. Julien Nordmann.  Diarrhea: The patient was given a prescription for Lomotil with instructions to take 1 to 2 tablets 4 times daily as needed.  Lower back pain.  The patient had been given tramadol while in the hospital without benefit.  He was given a prescription for oxycodone IR 5 mg with instructions to use either 1 every 4 hours as needed for pain or 2 every 6 hours.  Please see After Visit Summary for patient specific instructions.  Future Appointments  Date Time Provider [place]  06/24/2020 10:00 AM [facility] INFUSION [facility] None  06/26/2020  3:30 PM Elayne Snare, MD [facility] None  06/27/2020  9:45 AM [facility] LAB 6 [facility] None  06/27/2020 10:15 AM Curt Bears, MD [facility] None  06/27/2020 11:00 AM [facility] INFUSION [facility] None  06/27/2020 12:00 PM Jennet Maduro, RD [facility] None  06/28/2020  8:45 AM [facility] INFUSION [facility] None  07/03/2020 10:45 AM Lendon Colonel, NP [facility]  07/05/2020  3:45 PM Larey Days, PT [facility]  08/07/2020  1:30 PM Elayne Snare, MD [facility]  None    Orders Placed This Encounter  Procedures  . Magnesium  . Informed Consent Details: Physician/Practitioner Attestation; Transcribe to consent form and obtain patient signature  . Care order/instruction  . Type and screen  .  Prepare RBC (crossmatch)       Subjective:   Patient ID:  ALEKSA CATTERTON is a 79 y.o. (DOB 1941/03/15) male.  Chief Complaint:  Chief Complaint  Patient presents with  . Diarrhea  . Melena    Diarrhea  Pertinent negatives include no abdominal pain, chills, coughing, fever, headaches, myalgias or vomiting.   ALCARIO TINKEY  is a 79 y.o. male with a diagnosis of chronic lymphocytic leukemia who is managed by Dr. Julien Nordmann.  The patient is status post cycle 2 of rituximab and Bendeka which was dosed on 05/30/2020 and completed on 05/31/2020.  He presents to the clinic today with his wife after a recent extended hospitalization from 06/05/2020 through 06/20/2020.  He has fatigue, diarrhea despite his use of Imodium, and lower back pain.  Mr. Toste was referred for physical therapy but would like to be considered for home physical therapy.  Mr. Pulis was eager to return home given his back pain.  His magnesium level did not return until after he left.  Given that he is having diarrhea it was decided not to send a prescription for oral magnesium for him tonight.  We will wait till tomorrow when he will receive IV magnesium.  Medications: I have reviewed the patient's current medications.  Allergies:  Allergies  Allergen Reactions  . Glycopyrrolate Rash    Past Medical History:  Diagnosis Date  . CLL (chronic lymphocytic leukemia) ([place]) dx'd 2014   progression 12/2019  . Diabetes mellitus without complication ([place])   . Diverticulosis   . External hemorrhoids   . GERD (gastroesophageal reflux disease)   . Hiatal hernia   . History of colon polyps   . Hyperlipidemia   . Hypertension   . Internal hemorrhoids   . Lymphocytosis   . Schatzki's ring   . Sleep apnea     Past Surgical History:  Procedure Laterality Date  . BLEPHAROPLASTY    . COLONOSCOPY  2012  . HEMORRHOID SURGERY     Sclerotherapy  . KNEE SURGERY Right 2002  . POLYPECTOMY    . SHOULDER SURGERY Right  2007    Family History  Problem Relation Age of Onset  . Kidney disease Mother   . CAD Maternal Aunt   . Colon cancer Neg Hx   . Esophageal cancer Neg Hx   . Rectal cancer Neg Hx   . Stomach cancer Neg Hx     Social History   Socioeconomic History  . Marital status: Married    Spouse name: Not on file  . Number of children: 1  . Years of education: Not on file  . Highest education level: Not on file  Occupational History  . Occupation: Retail buyer: [employer]  Tobacco Use  . Smoking status: Never Smoker  . Smokeless tobacco: Former Systems developer    Types: Secondary school teacher  . Vaping Use: Never used  Substance and Sexual Activity  . Alcohol use: Yes    Alcohol/week: 6.0 standard drinks    Types: 6 Cans of beer per week  . Drug use: No  . Sexual activity: Yes  Other Topics Concern  . Not on file  Social History Narrative  . Not on file   Social  Determinants of Health   Financial Resource Strain:   . Difficulty of Paying Living Expenses:   Food Insecurity:   . Worried About Charity fundraiser in the Last Year:   . Arboriculturist in the Last Year:   Transportation Needs:   . Film/video editor (Medical):   Marland Kitchen Lack of Transportation (Non-Medical):   Physical Activity:   . Days of Exercise per Week:   . Minutes of Exercise per Session:   Stress:   . Feeling of Stress :   Social Connections:   . Frequency of Communication with Friends and Family:   . Frequency of Social Gatherings with Friends and Family:   . Attends Religious Services:   . Active Member of Clubs or Organizations:   . Attends Archivist Meetings:   Marland Kitchen Marital Status:   Intimate Partner Violence:   . Fear of Current or Ex-Partner:   . Emotionally Abused:   Marland Kitchen Physically Abused:   . Sexually Abused:     Past Medical History, Surgical history, Social history, and Family history were reviewed and updated as appropriate.   Please see review of systems for  further details on the patient's review from today.   Review of Systems:  Review of Systems  Constitutional: Positive for fatigue. Negative for chills, diaphoresis and fever.  HENT: Negative for trouble swallowing and voice change.   Respiratory: Negative for cough, chest tightness, shortness of breath and wheezing.   Cardiovascular: Negative for chest pain and palpitations.  Gastrointestinal: Positive for diarrhea. Negative for abdominal pain, constipation, nausea and vomiting.  Musculoskeletal: Positive for back pain. Negative for myalgias.  Neurological: Negative for dizziness, light-headedness and headaches.  Psychiatric/Behavioral: Negative for confusion.    Objective:   Physical Exam:  BP (!) 155/63 (BP Location: Left Arm, Patient Position: Sitting) Comment: retake notified nurse  Pulse 91   Temp 98.1 F (36.7 C) (Temporal)   Resp 20   Ht 5' 8.5" (1.74 m)   SpO2 98%   BMI 21.14 kg/m  ECOG: 1  Physical Exam Constitutional:      General: He is not in acute distress.    Appearance: He is not diaphoretic.  HENT:     Head: Normocephalic and atraumatic.  Cardiovascular:     Rate and Rhythm: Normal rate and regular rhythm.     Heart sounds: Normal heart sounds. No murmur heard.  No friction rub. No gallop.   Pulmonary:     Effort: Pulmonary effort is normal. No respiratory distress.     Breath sounds: Normal breath sounds. No wheezing or rales.  Skin:    General: Skin is warm and dry.     Findings: No erythema or rash.  Neurological:     Mental Status: He is alert.     Gait: Gait abnormal (The patient is ambulating with the use of a wheelchair.).  Psychiatric:        Attention and Perception: Attention and perception normal.        Mood and Affect: Mood and affect normal.        Speech: Speech normal.        Behavior: Behavior normal. Behavior is cooperative.        Thought Content: Thought content normal.        Cognition and Memory: Cognition is not impaired.  Memory is not impaired.     Lab Review:     Component Value Date/Time   NA 138 06/23/2020 1226  NA 141 08/06/2017 1306   K 3.5 06/23/2020 1226   K 4.3 08/06/2017 1306   CL 100 06/23/2020 1226   CO2 24 06/23/2020 1226   CO2 25 08/06/2017 1306   GLUCOSE 291 (H) 06/23/2020 1226   GLUCOSE 150 (H) 08/06/2017 1306   BUN 12 06/23/2020 1226   BUN 22.9 08/06/2017 1306   CREATININE 0.71 06/23/2020 1226   CREATININE 1.3 08/06/2017 1306   CALCIUM 7.9 (L) 06/23/2020 1226   CALCIUM 9.3 08/06/2017 1306   PROT 4.5 (L) 06/23/2020 1226   PROT 6.5 08/06/2017 1306   ALBUMIN 2.2 (L) 06/23/2020 1226   ALBUMIN 3.8 08/06/2017 1306   AST 13 (L) 06/23/2020 1226   AST 20 08/06/2017 1306   ALT <6 06/23/2020 1226   ALT 19 08/06/2017 1306   ALKPHOS 79 06/23/2020 1226   ALKPHOS 47 08/06/2017 1306   BILITOT 0.7 06/23/2020 1226   BILITOT 0.46 08/06/2017 1306   GFRNONAA >60 06/23/2020 1226   GFRAA >60 06/23/2020 1226       Component Value Date/Time   WBC 3.3 (L) 06/23/2020 1226   WBC 3.5 (L) 06/20/2020 0427   RBC 2.56 (L) 06/23/2020 1226   HGB 8.0 (L) 06/23/2020 1226   HGB 10.8 (L) 06/10/2020 1129   HGB 13.2 11/05/2017 1308   HCT 24.4 (L) 06/23/2020 1226   HCT 40.8 11/05/2017 1308   PLT 110 (L) 06/23/2020 1226   PLT 205 11/05/2017 1308   MCV 95.3 06/23/2020 1226   MCV 95.8 11/05/2017 1308   MCH 31.3 06/23/2020 1226   MCHC 32.8 06/23/2020 1226   RDW 17.6 (H) 06/23/2020 1226   RDW 14.0 11/05/2017 1308   LYMPHSABS 0.5 (L) 06/23/2020 1226   LYMPHSABS 66.1 (H) 11/05/2017 1308   MONOABS 0.2 06/23/2020 1226   MONOABS 1.4 (H) 11/05/2017 1308   EOSABS 0.0 06/23/2020 1226   EOSABS 0.3 11/05/2017 1308   BASOSABS 0.0 06/23/2020 1226   BASOSABS 0.2 (H) 11/05/2017 1308   -------------------------------  Imaging from last 24 hours (if applicable):  Radiology interpretation: CT ABDOMEN PELVIS WO CONTRAST  Result Date: 06/07/2020 CLINICAL DATA:  Respiratory failure and abdominal pain with  fever. History of colonic polyps and diverticulosis. History of CLL. EXAM: CT CHEST, ABDOMEN AND PELVIS WITHOUT CONTRAST TECHNIQUE: Multidetector CT imaging of the chest, abdomen and pelvis was performed following the standard protocol without IV contrast. COMPARISON:  Chest CTA 05/16/2020.  Abdominopelvic CT 04/05/2020. FINDINGS: CT CHEST FINDINGS Cardiovascular: Right arm PICC extends to the superior cavoatrial junction. There is diffuse atherosclerosis of the aorta, great vessels and coronary arteries. The heart is mildly enlarged. No significant pericardial fluid. Mediastinum/Nodes: There are no enlarged mediastinal, hilar or axillary lymph nodes. The thyroid gland, trachea and esophagus demonstrate no significant findings. Lungs/Pleura: There are new small bilateral pleural effusions with dependent opacities in both lungs, probably atelectasis. These obscure the previously demonstrated nodular densities in both lower lobes. These were not present on the abdominal CT from April and were probably inflammatory. No consolidation or new abnormality identified. There is mild central airway thickening. Musculoskeletal/Chest wall: No chest wall mass or suspicious osseous findings. CT ABDOMEN AND PELVIS FINDINGS Hepatobiliary: No focal hepatic abnormalities are identified on noncontrast imaging. The gallbladder is mildly distended without evidence of gallstones, wall thickening or focal surrounding inflammation. Pancreas: No focal pancreatic abnormality or surrounding inflammation identified. Spleen: Resolution of previously demonstrated splenomegaly. No focal abnormality identified. Adrenals/Urinary Tract: Both adrenal glands appear normal. Stable dominant cyst in the lower interpolar region of  the right kidney. No evidence of urinary tract calculus or hydronephrosis. New symmetric perinephric soft tissue stranding bilaterally without focal fluid collection. The bladder is decompressed by a Foley catheter. There is a  small amount of air in the bladder lumen. Possible mild bladder wall thickening. Stomach/Bowel: Enteric contrast is present within the colon, but there is none in the stomach or small bowel. The stomach and small bowel are decompressed without apparent focal wall thickening. The appendix appears normal. Diverticular changes are present within the descending and sigmoid colon. No evidence of bowel obstruction or perforation. Vascular/Lymphatic: Again demonstrated are several mildly enlarged retroperitoneal and left pelvic lymph nodes, similar to the prior CT and presumably related to the patient's CLL. No progressive adenopathy identified. Diffuse aortic and branch vessel atherosclerosis without acute vascular findings on noncontrast imaging. Reproductive: Central prostatic calcifications. Other: New small amount of ascites around the liver and in both pericolic gutters and the pelvis. No focal extraluminal fluid collection or extravasated enteric contrast. There is generalized edema throughout the intra-abdominal and subcutaneous fat. There is a stable small umbilical hernia containing only fat. Musculoskeletal: No acute or significant osseous findings. IMPRESSION: 1. New small bilateral pleural effusions, ascites and generalized edema throughout the intra-abdominal and subcutaneous fat, consistent with anasarca. No focal extraluminal fluid collection or extravasated enteric contrast. 2. New symmetric perinephric soft tissue stranding bilaterally without focal fluid collection. The bladder is decompressed by a Foley catheter. Possible mild bladder wall thickening. Correlate with urinalysis. 3. Probable atelectasis at both lung bases adjacent to the pleural effusions. No evidence of pneumonia. 4. Stable mildly enlarged retroperitoneal and left pelvic lymph nodes, presumably related to the patient's chronic lymphocytic leukemia. No significant residual splenomegaly. 5. Aortic Atherosclerosis (ICD10-I70.0).  Electronically Signed   By: Richardean Sale M.D.   On: 06/07/2020 14:08   CT Head Wo Contrast  Result Date: 06/05/2020 CLINICAL DATA:  Altered mental status fatigue and weakness EXAM: CT HEAD WITHOUT CONTRAST TECHNIQUE: Contiguous axial images were obtained from the base of the skull through the vertex without intravenous contrast. COMPARISON:  MRI 11/07/2014 FINDINGS: Brain: No acute territorial infarction, hemorrhage, or intracranial mass. Mild to moderate atrophy. Mild hypodensity in the white matter consistent with chronic small vessel ischemic change. Prominent ventricle size, likely due to atrophy. Vascular: No hyperdense vessels.  No unexpected calcification Skull: Motion artifact.  No gross fracture Sinuses/Orbits: No acute finding. Other: None IMPRESSION: 1. No CT evidence for acute intracranial abnormality. 2. Atrophy and mild chronic small vessel ischemic changes of the white matter. Electronically Signed   By: Donavan Foil M.D.   On: 06/05/2020 17:29   CT CHEST WO CONTRAST  Result Date: 06/07/2020 CLINICAL DATA:  Respiratory failure and abdominal pain with fever. History of colonic polyps and diverticulosis. History of CLL. EXAM: CT CHEST, ABDOMEN AND PELVIS WITHOUT CONTRAST TECHNIQUE: Multidetector CT imaging of the chest, abdomen and pelvis was performed following the standard protocol without IV contrast. COMPARISON:  Chest CTA 05/16/2020.  Abdominopelvic CT 04/05/2020. FINDINGS: CT CHEST FINDINGS Cardiovascular: Right arm PICC extends to the superior cavoatrial junction. There is diffuse atherosclerosis of the aorta, great vessels and coronary arteries. The heart is mildly enlarged. No significant pericardial fluid. Mediastinum/Nodes: There are no enlarged mediastinal, hilar or axillary lymph nodes. The thyroid gland, trachea and esophagus demonstrate no significant findings. Lungs/Pleura: There are new small bilateral pleural effusions with dependent opacities in both lungs, probably  atelectasis. These obscure the previously demonstrated nodular densities in both lower lobes. These were not  present on the abdominal CT from April and were probably inflammatory. No consolidation or new abnormality identified. There is mild central airway thickening. Musculoskeletal/Chest wall: No chest wall mass or suspicious osseous findings. CT ABDOMEN AND PELVIS FINDINGS Hepatobiliary: No focal hepatic abnormalities are identified on noncontrast imaging. The gallbladder is mildly distended without evidence of gallstones, wall thickening or focal surrounding inflammation. Pancreas: No focal pancreatic abnormality or surrounding inflammation identified. Spleen: Resolution of previously demonstrated splenomegaly. No focal abnormality identified. Adrenals/Urinary Tract: Both adrenal glands appear normal. Stable dominant cyst in the lower interpolar region of the right kidney. No evidence of urinary tract calculus or hydronephrosis. New symmetric perinephric soft tissue stranding bilaterally without focal fluid collection. The bladder is decompressed by a Foley catheter. There is a small amount of air in the bladder lumen. Possible mild bladder wall thickening. Stomach/Bowel: Enteric contrast is present within the colon, but there is none in the stomach or small bowel. The stomach and small bowel are decompressed without apparent focal wall thickening. The appendix appears normal. Diverticular changes are present within the descending and sigmoid colon. No evidence of bowel obstruction or perforation. Vascular/Lymphatic: Again demonstrated are several mildly enlarged retroperitoneal and left pelvic lymph nodes, similar to the prior CT and presumably related to the patient's CLL. No progressive adenopathy identified. Diffuse aortic and branch vessel atherosclerosis without acute vascular findings on noncontrast imaging. Reproductive: Central prostatic calcifications. Other: New small amount of ascites around the  liver and in both pericolic gutters and the pelvis. No focal extraluminal fluid collection or extravasated enteric contrast. There is generalized edema throughout the intra-abdominal and subcutaneous fat. There is a stable small umbilical hernia containing only fat. Musculoskeletal: No acute or significant osseous findings. IMPRESSION: 1. New small bilateral pleural effusions, ascites and generalized edema throughout the intra-abdominal and subcutaneous fat, consistent with anasarca. No focal extraluminal fluid collection or extravasated enteric contrast. 2. New symmetric perinephric soft tissue stranding bilaterally without focal fluid collection. The bladder is decompressed by a Foley catheter. Possible mild bladder wall thickening. Correlate with urinalysis. 3. Probable atelectasis at both lung bases adjacent to the pleural effusions. No evidence of pneumonia. 4. Stable mildly enlarged retroperitoneal and left pelvic lymph nodes, presumably related to the patient's chronic lymphocytic leukemia. No significant residual splenomegaly. 5. Aortic Atherosclerosis (ICD10-I70.0). Electronically Signed   By: Richardean Sale M.D.   On: 06/07/2020 14:08   DG CHEST PORT 1 VIEW  Result Date: 06/10/2020 CLINICAL DATA:  Respiratory abnormality. EXAM: PORTABLE CHEST 1 VIEW COMPARISON:  June 08, 2020 FINDINGS: A right PICC line terminates in the central SVC. Haziness in the lung bases is identified. Mild cardiomegaly. The hila and mediastinum are unremarkable. No nodules or masses. No overt edema. IMPRESSION: 1. Increasing hazy opacities in the lung bases could represent developing infiltrate or layering effusions with underlying atelectasis. Recommend clinical correlation and attention on follow-up. 2. The right PICC line remains in good position. 3. No acute interval changes otherwise seen. Electronically Signed   By: Dorise Bullion III M.D   On: 06/10/2020 06:41   DG CHEST PORT 1 VIEW  Result Date: 06/08/2020 CLINICAL  DATA:  Sepsis. EXAM: PORTABLE CHEST 1 VIEW COMPARISON:  CT report 06/07/2020.  Chest x-ray 06/07/2020. FINDINGS: Right PICC line noted with tip at cavoatrial junction. Heart size stable. Mild bibasilar atelectasis. No pleural effusion or pneumothorax. Stable small calcific density noted the right humeral head, most likely a bone island. IMPRESSION: 1.  Right PICC line noted with tip at cavoatrial junction.  2.  Low lung volumes.  Mild bibasilar atelectasis. Electronically Signed   By: Marcello Moores  Register   On: 06/08/2020 07:25   DG Chest Port 1 View  Result Date: 06/07/2020 CLINICAL DATA:  Respiratory failure EXAM: PORTABLE CHEST 1 VIEW COMPARISON:  06/06/2020 FINDINGS: Cardiac shadow is at the upper limits of normal in size and stable. Aortic calcifications are again seen. The lungs are well aerated bilaterally. No focal infiltrate or sizable effusion is seen. No bony abnormality is noted. IMPRESSION: No acute abnormality seen. Electronically Signed   By: Inez Catalina M.D.   On: 06/07/2020 09:03   DG CHEST PORT 1 VIEW  Result Date: 06/06/2020 CLINICAL DATA:  Possible pneumonia EXAM: PORTABLE CHEST 1 VIEW COMPARISON:  06/05/2020 FINDINGS: Cardiac shadow is stable. Tortuous aorta with calcifications is again seen. Lungs are well aerated bilaterally. No focal infiltrate or sizable effusion is seen. Some accentuation of the mediastinum is noted due to patient rotation. IMPRESSION: No acute abnormality noted. Electronically Signed   By: Inez Catalina M.D.   On: 06/06/2020 13:25   DG Chest Portable 1 View  Result Date: 06/05/2020 CLINICAL DATA:  Fatigue and weakness. EXAM: PORTABLE CHEST 1 VIEW COMPARISON:  May 16, 2020 FINDINGS: The study is limited secondary to patient rotation. There is no evidence of acute infiltrate, pleural effusion or pneumothorax. The heart size and mediastinal contours are within normal limits. A chronic tenth left rib fracture is seen. The visualized skeletal structures are otherwise  unremarkable. IMPRESSION: No active disease. Electronically Signed   By: Virgina Norfolk M.D.   On: 06/05/2020 16:07   ECHOCARDIOGRAM COMPLETE  Result Date: 06/07/2020    ECHOCARDIOGRAM REPORT   Patient Name:   ANDRES BANTZ Grant Date of Exam: 06/07/2020 Medical Rec #:  [id number]         Height:       68.5 in Accession #:    [id number]        Weight:       130.1 lb Date of Birth:  08/23/1941         BSA:          106.711 m Patient Age:    46 years          BP:           93/60 mmHg Patient Gender: M                 HR:           69 bpm. Exam Location:  Inpatient Procedure: 2D Echo, Cardiac Doppler, Color Doppler and Intracardiac            Opacification Agent Indications:    Chemo V67.2 / Z09                 Fever 780.6 / R50.9  History:        Patient has no prior history of Echocardiogram examinations.                 Risk Factors:Hypertension, Diabetes, Dyslipidemia and                 Non-Smoker. GERD.  Sonographer:    Vickie Epley RDCS Referring Phys: [id number] [place]  1. Left ventricular ejection fraction, by estimation, is 25 to 30%. The left ventricle has severely decreased function. The left ventricle has no regional wall motion abnormalities. Left ventricular diastolic parameters are consistent with Grade II diastolic dysfunction (pseudonormalization).  2. Right ventricular systolic function  is normal. The right ventricular size is normal. There is normal pulmonary artery systolic pressure.  3. The mitral valve is normal in structure. No evidence of mitral valve regurgitation. No evidence of mitral stenosis.  4. The aortic valve is grossly normal. Aortic valve regurgitation is not visualized. No aortic stenosis is present.  5. The inferior vena cava is normal in size with greater than 50% respiratory variability, suggesting right atrial pressure of 3 mmHg. FINDINGS  Left Ventricle: Left ventricular ejection fraction, by estimation, is 25 to 30%. The left ventricle has severely decreased  function. The left ventricle has no regional wall motion abnormalities. Definity contrast agent was given IV to delineate the left  ventricular endocardial borders. The left ventricular internal cavity size was normal in size. There is no left ventricular hypertrophy. Left ventricular diastolic parameters are consistent with Grade II diastolic dysfunction (pseudonormalization). Right Ventricle: The right ventricular size is normal. No increase in right ventricular wall thickness. Right ventricular systolic function is normal. There is normal pulmonary artery systolic pressure. The tricuspid regurgitant velocity is 1.96 m/s, and  with an assumed right atrial pressure of 3 mmHg, the estimated right ventricular systolic pressure is 22.9 mmHg. Left Atrium: Left atrial size was normal in size. Right Atrium: Right atrial size was normal in size. Pericardium: There is no evidence of pericardial effusion. Mitral Valve: The mitral valve is normal in structure. No evidence of mitral valve regurgitation. No evidence of mitral valve stenosis. Tricuspid Valve: The tricuspid valve is normal in structure. Tricuspid valve regurgitation is mild . No evidence of tricuspid stenosis. Aortic Valve: The aortic valve is grossly normal. Aortic valve regurgitation is not visualized. No aortic stenosis is present. Pulmonic Valve: The pulmonic valve was normal in structure. Pulmonic valve regurgitation is not visualized. No evidence of pulmonic stenosis. Aorta: The aortic root and ascending aorta are structurally normal, with no evidence of dilitation. Venous: The inferior vena cava is normal in size with greater than 50% respiratory variability, suggesting right atrial pressure of 3 mmHg. IAS/Shunts: The atrial septum is grossly normal.  LEFT VENTRICLE PLAX 2D LVIDd:         4.40 cm      Diastology LVIDs:         3.70 cm      LV e' lateral:   7.27 cm/s LV PW:         0.70 cm      LV E/e' lateral: 10.3 LV IVS:        0.70 cm      LV e'  medial:    6.57 cm/s LVOT diam:     1.80 cm      LV E/e' medial:  11.4 LV SV:         26 LV SV Index:   15 LVOT Area:     2.54 cm  LV Volumes (MOD) LV vol d, MOD A2C: 126.0 ml LV vol d, MOD A4C: 113.0 ml LV vol s, MOD A2C: 85.1 ml LV vol s, MOD A4C: 60.7 ml LV SV MOD A2C:     40.9 ml LV SV MOD A4C:     113.0 ml LV SV MOD BP:      44.6 ml RIGHT VENTRICLE RV S prime:     7.97 cm/s TAPSE (M-mode): 1.6 cm LEFT ATRIUM             Index       RIGHT ATRIUM  Index LA diam:        3.10 cm 1.81 cm/m  RA Area:     13.40 cm LA Vol (A2C):   28.0 ml 16.36 ml/m RA Volume:   31.10 ml  18.17 ml/m LA Vol (A4C):   23.0 ml 13.44 ml/m LA Biplane Vol: 27.5 ml 16.07 ml/m  AORTIC VALVE LVOT Vmax:   58.50 cm/s LVOT Vmean:  39.600 cm/s LVOT VTI:    0.102 m  AORTA Ao Root diam: 3.40 cm MITRAL VALVE               TRICUSPID VALVE MV Area (PHT): 4.06 cm    TR Peak grad:   15.4 mmHg MV Decel Time: 187 msec    TR Vmax:        196.00 cm/s MV E velocity: 75.00 cm/s MV A velocity: 72.40 cm/s  SHUNTS MV E/A ratio:  1.04        Systemic VTI:  0.10 m                            Systemic Diam: 1.80 cm Mertie Moores MD Electronically signed by Mertie Moores MD Signature Date/Time: 06/07/2020/12:52:20 PM    Final    VAS [place] LOWER EXTREMITY VENOUS (DVT)  Result Date: 06/11/2020  Lower Venous DVTStudy Indications: Swelling.  Risk Factors: None identified. Comparison Study: 06/07/2020 - Negative for DVT. Performing Technologist: Oliver Hum RVT  Examination Guidelines: A complete evaluation includes B-mode imaging, spectral Doppler, color Doppler, and power Doppler as needed of all accessible portions of each vessel. Bilateral testing is considered an integral part of a complete examination. Limited examinations for reoccurring indications may be performed as noted. The reflux portion of the exam is performed with the patient in reverse Trendelenburg.  +---------+---------------+---------+-----------+----------+--------------+ RIGHT     CompressibilityPhasicitySpontaneityPropertiesThrombus Aging +---------+---------------+---------+-----------+----------+--------------+ CFV      Full           Yes      Yes                                 +---------+---------------+---------+-----------+----------+--------------+ SFJ      Full                                                        +---------+---------------+---------+-----------+----------+--------------+ FV Prox  Full                                                        +---------+---------------+---------+-----------+----------+--------------+ FV Mid   Full                                                        +---------+---------------+---------+-----------+----------+--------------+ FV DistalFull                                                        +---------+---------------+---------+-----------+----------+--------------+  PFV      Full                                                        +---------+---------------+---------+-----------+----------+--------------+ POP      Full           Yes      Yes                                 +---------+---------------+---------+-----------+----------+--------------+ PTV      Full                                                        +---------+---------------+---------+-----------+----------+--------------+ PERO     Full                                                        +---------+---------------+---------+-----------+----------+--------------+   +----+---------------+---------+-----------+----------+--------------+ LEFTCompressibilityPhasicitySpontaneityPropertiesThrombus Aging +----+---------------+---------+-----------+----------+--------------+ CFV Full           Yes      Yes                                 +----+---------------+---------+-----------+----------+--------------+     Summary: RIGHT: - There is no evidence of deep vein thrombosis in the lower  extremity.  - No cystic structure found in the popliteal fossa.  LEFT: - No evidence of common femoral vein obstruction.  *See table(s) above for measurements and observations. Electronically signed by Deitra Mayo MD on 06/11/2020 at 6:52:41 PM.    Final    VAS [place] LOWER EXTREMITY VENOUS (DVT)  Result Date: 06/07/2020  Lower Venous DVTStudy Indications: Pain.  Risk Factors: None identified. Limitations: Poor ultrasound/tissue interface. Comparison Study: No prior studies. Performing Technologist: Oliver Hum RVT  Examination Guidelines: A complete evaluation includes B-mode imaging, spectral Doppler, color Doppler, and power Doppler as needed of all accessible portions of each vessel. Bilateral testing is considered an integral part of a complete examination. Limited examinations for reoccurring indications may be performed as noted. The reflux portion of the exam is performed with the patient in reverse Trendelenburg.  +---------+---------------+---------+-----------+----------+--------------+ RIGHT    CompressibilityPhasicitySpontaneityPropertiesThrombus Aging +---------+---------------+---------+-----------+----------+--------------+ CFV      Full           Yes      Yes                                 +---------+---------------+---------+-----------+----------+--------------+ SFJ      Full                                                        +---------+---------------+---------+-----------+----------+--------------+ FV Prox  Full                                                        +---------+---------------+---------+-----------+----------+--------------+  FV Mid   Full                                                        +---------+---------------+---------+-----------+----------+--------------+ FV DistalFull                                                        +---------+---------------+---------+-----------+----------+--------------+ PFV       Full                                                        +---------+---------------+---------+-----------+----------+--------------+ POP      Full           Yes      Yes                                 +---------+---------------+---------+-----------+----------+--------------+ PTV      Full                                                        +---------+---------------+---------+-----------+----------+--------------+ PERO     Full                                                        +---------+---------------+---------+-----------+----------+--------------+   +----+---------------+---------+-----------+----------+--------------+ LEFTCompressibilityPhasicitySpontaneityPropertiesThrombus Aging +----+---------------+---------+-----------+----------+--------------+ CFV Full           Yes      Yes                                 +----+---------------+---------+-----------+----------+--------------+     Summary: RIGHT: - There is no evidence of deep vein thrombosis in the lower extremity.  - No cystic structure found in the popliteal fossa.  LEFT: - No evidence of common femoral vein obstruction.  *See table(s) above for measurements and observations. Electronically signed by Curt Jews MD on 06/07/2020 at 3:44:25 PM.    Final    [place] EKG SITE RITE  Result Date: 06/07/2020 If Site Rite image not attached, placement could not be confirmed due to current cardiac rhythm.    Marland Kitchen

## 2020-06-23 NOTE — Patient Instructions (Signed)
Anemia  Anemia is a condition in which you do not have enough red blood cells or hemoglobin. Hemoglobin is a substance in red blood cells that carries oxygen. When you do not have enough red blood cells or hemoglobin (are anemic), your body cannot get enough oxygen and your organs may not work properly. As a result, you may feel very tired or have other problems. What are the causes? Common causes of anemia include:  Excessive bleeding. Anemia can be caused by excessive bleeding inside or outside the body, including bleeding from the intestine or from periods in women.  Poor nutrition.  Long-lasting (chronic) kidney, thyroid, and liver disease.  Bone marrow disorders.  Cancer and treatments for cancer.  HIV (human immunodeficiency virus) and AIDS (acquired immunodeficiency syndrome).  Treatments for HIV and AIDS.  Spleen problems.  Blood disorders.  Infections, medicines, and autoimmune disorders that destroy red blood cells. What are the signs or symptoms? Symptoms of this condition include:  Minor weakness.  Dizziness.  Headache.  Feeling heartbeats that are irregular or faster than normal (palpitations).  Shortness of breath, especially with exercise.  Paleness.  Cold sensitivity.  Indigestion.  Nausea.  Difficulty sleeping.  Difficulty concentrating. Symptoms may occur suddenly or develop slowly. If your anemia is mild, you may not have symptoms. How is this diagnosed? This condition is diagnosed based on:  Blood tests.  Your medical history.  A physical exam.  Bone marrow biopsy. Your health care provider may also check your stool (feces) for blood and may do additional testing to look for the cause of your bleeding. You may also have other tests, including:  Imaging tests, such as a CT scan or MRI.  Endoscopy.  Colonoscopy. How is this treated? Treatment for this condition depends on the cause. If you continue to lose a lot of blood, you may  need to be treated at a hospital. Treatment may include:  Taking supplements of iron, vitamin S31, or folic acid.  Taking a hormone medicine (erythropoietin) that can help to stimulate red blood cell growth.  Having a blood transfusion. This may be needed if you lose a lot of blood.  Making changes to your diet.  Having surgery to remove your spleen. Follow these instructions at home:  Take over-the-counter and prescription medicines only as told by your health care provider.  Take supplements only as told by your health care provider.  Follow any diet instructions that you were given.  Keep all follow-up visits as told by your health care provider. This is important. Contact a health care provider if:  You develop new bleeding anywhere in the body. Get help right away if:  You are very weak.  You are short of breath.  You have pain in your abdomen or chest.  You are dizzy or feel faint.  You have trouble concentrating.  You have bloody or black, tarry stools.  You vomit repeatedly or you vomit up blood. Summary  Anemia is a condition in which you do not have enough red blood cells or enough of a substance in your red blood cells that carries oxygen (hemoglobin).  Symptoms may occur suddenly or develop slowly.  If your anemia is mild, you may not have symptoms.  This condition is diagnosed with blood tests as well as a medical history and physical exam. Other tests may be needed.  Treatment for this condition depends on the cause of the anemia. This information is not intended to replace advice given to you by  your health care provider. Make sure you discuss any questions you have with your health care provider. Document Revised: 11/14/2017 Document Reviewed: 01/03/2017 Elsevier Patient Education  [place].

## 2020-06-24 ENCOUNTER — Other Ambulatory Visit: Payer: Self-pay

## 2020-06-24 ENCOUNTER — Inpatient Hospital Stay: Payer: Medicare HMO

## 2020-06-24 DIAGNOSIS — R197 Diarrhea, unspecified: Secondary | ICD-10-CM | POA: Diagnosis not present

## 2020-06-24 DIAGNOSIS — R630 Anorexia: Secondary | ICD-10-CM | POA: Diagnosis not present

## 2020-06-24 DIAGNOSIS — E878 Other disorders of electrolyte and fluid balance, not elsewhere classified: Secondary | ICD-10-CM | POA: Diagnosis not present

## 2020-06-24 DIAGNOSIS — E860 Dehydration: Secondary | ICD-10-CM | POA: Diagnosis not present

## 2020-06-24 DIAGNOSIS — E790 Hyperuricemia without signs of inflammatory arthritis and tophaceous disease: Secondary | ICD-10-CM | POA: Diagnosis not present

## 2020-06-24 DIAGNOSIS — D649 Anemia, unspecified: Secondary | ICD-10-CM | POA: Diagnosis not present

## 2020-06-24 DIAGNOSIS — C9110 Chronic lymphocytic leukemia of B-cell type not having achieved remission: Secondary | ICD-10-CM | POA: Diagnosis not present

## 2020-06-24 DIAGNOSIS — R634 Abnormal weight loss: Secondary | ICD-10-CM | POA: Diagnosis not present

## 2020-06-24 DIAGNOSIS — E119 Type 2 diabetes mellitus without complications: Secondary | ICD-10-CM | POA: Diagnosis not present

## 2020-06-24 LAB — TYPE AND SCREEN
ABO/RH(D): A POS
Antibody Screen: POSITIVE
DAT, IgG: POSITIVE

## 2020-06-24 MED ORDER — SODIUM CHLORIDE 0.9 % IV SOLN
6.0000 g | Freq: Once | INTRAVENOUS | Status: AC
  Administered 2020-06-24: 6 g via INTRAVENOUS
  Filled 2020-06-24: qty 12

## 2020-06-24 NOTE — Patient Instructions (Signed)
Hypomagnesemia Hypomagnesemia is a condition in which the level of magnesium in the blood is low. Magnesium is a mineral that is found in many foods. It is used in many different processes in the body. Hypomagnesemia can affect every organ in the body. In severe cases, it can cause life-threatening problems. What are the causes? This condition may be caused by:  Not getting enough magnesium in your diet.  Malnutrition.  Problems with absorbing magnesium from the intestines.  Dehydration.  Alcohol abuse.  Vomiting.  Severe or chronic diarrhea.  Some medicines, including medicines that make you urinate more (diuretics).  Certain diseases, such as kidney disease, diabetes, celiac disease, and overactive thyroid. What are the signs or symptoms? Symptoms of this condition include:  Loss of appetite.  Nausea and vomiting.  Involuntary shaking or trembling of a body part (tremor).  Muscle weakness.  Tingling in the arms and legs.  Sudden tightening of muscles (muscle spasms).  Confusion.  Psychiatric issues, such as depression, irritability, or psychosis.  A feeling of fluttering of the heart.  Seizures. These symptoms are more severe if magnesium levels drop suddenly. How is this diagnosed? This condition may be diagnosed based on:  Your symptoms and medical history.  A physical exam.  Blood and urine tests. How is this treated? Treatment depends on the cause and the severity of the condition. It may be treated with:  A magnesium supplement. This can be taken in pill form. If the condition is severe, magnesium is usually given through an IV.  Changes to your diet. You may be directed to eat foods that have a lot of magnesium, such as green leafy vegetables, peas, beans, and nuts.  Stopping any intake of alcohol. Follow these instructions at home:      Make sure that your diet includes foods with magnesium. Foods that have a lot of magnesium in them  include: ? Green leafy vegetables, such as spinach and broccoli. ? Beans and peas. ? Nuts and seeds, such as almonds and sunflower seeds. ? Whole grains, such as whole grain bread and fortified cereals.  Take magnesium supplements if your health care provider tells you to do that. Take them as directed.  Take over-the-counter and prescription medicines only as told by your health care provider.  Have your magnesium levels monitored as told by your health care provider.  When you are active, drink fluids that contain electrolytes.  Avoid drinking alcohol.  Keep all follow-up visits as told by your health care provider. This is important. Contact a health care provider if:  You get worse instead of better.  Your symptoms return. Get help right away if you:  Develop severe muscle weakness.  Have trouble breathing.  Feel that your heart is racing. Summary  Hypomagnesemia is a condition in which the level of magnesium in the blood is low.  Hypomagnesemia can affect every organ in the body.  Treatment may include eating more foods that contain magnesium, taking magnesium supplements, and not drinking alcohol.  Have your magnesium levels monitored as told by your health care provider. This information is not intended to replace advice given to you by your health care provider. Make sure you discuss any questions you have with your health care provider. Document Revised: 11/14/2017 Document Reviewed: 11/03/2017 Elsevier Patient Education  2020 [employer]  

## 2020-06-26 ENCOUNTER — Ambulatory Visit: Payer: Medicare HMO | Admitting: Endocrinology

## 2020-06-26 ENCOUNTER — Ambulatory Visit
Admission: RE | Admit: 2020-06-26 | Discharge: 2020-06-26 | Disposition: A | Discharge status: Home / Self Care | Payer: Medicare HMO | Source: Ambulatory Visit | Attending: Endocrinology | Admitting: Endocrinology

## 2020-06-26 ENCOUNTER — Other Ambulatory Visit: Payer: Self-pay

## 2020-06-26 ENCOUNTER — Encounter: Payer: Self-pay | Admitting: Endocrinology

## 2020-06-26 ENCOUNTER — Telehealth: Payer: Self-pay

## 2020-06-26 ENCOUNTER — Telehealth: Payer: Self-pay | Admitting: Medical Oncology

## 2020-06-26 VITALS — BP 130/80 | HR 96 | Ht 68.5 in

## 2020-06-26 DIAGNOSIS — M545 Low back pain, unspecified: Secondary | ICD-10-CM

## 2020-06-26 DIAGNOSIS — I700 Atherosclerosis of aorta: Secondary | ICD-10-CM | POA: Diagnosis not present

## 2020-06-26 DIAGNOSIS — Z794 Long term (current) use of insulin: Secondary | ICD-10-CM | POA: Diagnosis not present

## 2020-06-26 DIAGNOSIS — E43 Unspecified severe protein-calorie malnutrition: Secondary | ICD-10-CM | POA: Diagnosis not present

## 2020-06-26 DIAGNOSIS — R300 Dysuria: Secondary | ICD-10-CM | POA: Diagnosis not present

## 2020-06-26 DIAGNOSIS — R197 Diarrhea, unspecified: Secondary | ICD-10-CM | POA: Diagnosis not present

## 2020-06-26 DIAGNOSIS — E1165 Type 2 diabetes mellitus with hyperglycemia: Secondary | ICD-10-CM | POA: Diagnosis not present

## 2020-06-26 LAB — SURGICAL PATHOLOGY

## 2020-06-26 MED ORDER — METRONIDAZOLE 250 MG PO TABS: 250.0000 mg | ORAL_TABLET | Freq: Three times a day (TID) | ORAL | 0 refills | Status: DC

## 2020-06-26 MED FILL — Dexamethasone Sodium Phosphate Inj 100 MG/10ML: INTRAMUSCULAR | Qty: 1 | Status: AC

## 2020-06-26 NOTE — Patient Instructions (Addendum)
tresiba 10 units and keep am sugar <140  Stop Cephelexin and start New Rx

## 2020-06-26 NOTE — Progress Notes (Signed)
Patient ID: Tony Marshall, male   DOB: 1941/03/28, 79 y.o.   MRN: [id number]   Reason for Appointment: Post hospital follow-up of various conditions  Patient's hospitalization records, labs, studies, discharge summary reviewed in detail Medication changes and reconciliation done Telephone contact was made within 48 hours of discharge which was on 06/20/2020  Significant discharge diagnoses: Septic shock due to right lower extremity cellulitis   Acute respiratory failure (HCC)   Hyperglycemia due to diabetes mellitus (HCC)   Leukopenia   Acute combined systolic and diastolic heart failure (HCC)   Cardiogenic shock (HCC)   Malnutrition of moderate degree   History of Present Illness    1.  DIARRHEA: This is persistent and severe and not resolved with Lomotil.  Food intake has been minimal.  No fever or abdominal pain. The patient has been on cephalexin for cellulitis.  C. difficile toxin study was done on 6/23 and was negative at that time  2.  Decreased appetite: He has minimal appetite and also refusing to take Glucerna.  He says that he has no taste and also his wife thinks that he has some difficulty eating or drinking Appears to have lost a lot of weight  3.  Low back pain: This has been persistent and not clear of the etiology.  He has been given narcotic pain medications for this No recent x-ray available   4.  Cellulitis of the right leg treated with Ancef and subsequently on cephalexin with improvement.  Redness is much less now.  He does have leukopenia secondary to his chemotherapy with his last white count 3.3  5.  Hypomagnesemia: This has been treated with IV infusions  6.. Type 2 DIABETES MELITUS, date of diagnosis:  1992        His blood sugar has been  difficult to control over the last few years because of variability in his blood sugars On basal bolus insulin for a few years and his oral hypoglycemic drugs have been continued Also did not  tolerate GLP-1 drugs or symlin because of nausea He is essentially looking like a a type I patient with requiring relatively low amounts of insulin and twice a day dosage of basal insulin He has done somewhat better with Levemir compared to Lantus and is using this twice a day also  He also had overall much better controlled with adding Invokana in 07/2016  Recent history:  Insulin regimen: insulin TRESIBA 7-8 units am.  Mealtime insulin: None Previous doses: Breakfast = 2-3 units, lunch 2-3 units supper 2-4 units based on meal size  Oral hypoglycemic drugs: None currently  His A1c is higher at 7.7  Recent blood sugar patterns and problems:  He has had decreased appetite more recently and has had only smaller portions usually  Because of low blood sugars his insulin dose had been reduced prior to and during hospitalization  However his blood sugars are mostly over 200 even fasting and as high as 291 in the lab  His wife states that the lowest blood sugar is 178 and she is trying to help him monitor this  His intake has been poor with very little food and only some intake of Glucerna    Hypoglycemia:  as above   Side effects from medications:  Diarrhea from metformin, weight loss from Invokana  Monitors blood glucose:  About once or twice a day    Glucometer: One Touch Verio.  Blood Glucose readings as above  Previous data:  PRE-MEAL Fasting Lunch Dinner Bedtime Overall  Glucose range:  56-195   34-237    Mean/median:  119   146   133   POST-MEAL PC Breakfast PC Lunch PC Dinner  Glucose range:    94, 174  Mean/median:        Wt Readings from Last 3 Encounters:  06/17/20 141 lb 1.5 oz (64 kg)  05/30/20 124 lb 6.4 oz (56.4 kg)  05/16/20 126 lb (57.2 kg)          LABS:  Lab Results  Component Value Date   HGBA1C 7.7 (H) 06/06/2020   HGBA1C 4.7 03/20/2020   HGBA1C 6.7 (H) 12/20/2019   Lab Results  Component Value Date   MICROALBUR 2.7 (H) 06/11/2019    [place] 61 06/10/2020   CREATININE 0.71 06/23/2020     Hypertension:    His blood pressure has been high for over 30 years and previously difficult to control. He was evaluated in 1/08 with urine metanephrines and renal artery ultrasound.    Current medication regimen: Only Entresto since he had low blood pressure readings during hospitalization  BP Readings from Last 3 Encounters:  06/26/20 130/80  06/24/20 (!) 150/75  06/23/20 (!) 155/63      Other ACTIVE problems: See review of systems  LABS:  Office Visit on 06/23/2020  Component Date Value Ref Range Status  . ABO/RH(D) 06/23/2020 A POS   Final  . Antibody Screen 06/23/2020 POS   Final  . Sample Expiration 06/23/2020 06/26/2020,2359   Final  . Antibody Identification 16/06/3709 WARM AUTOANTIBODY   Final  . DAT, IgG 06/23/2020 POS   Final  . Antibody ID,T [place] 06/23/2020    Final                   Value:WARM AUTOANTIBODY Performed at [facility], [place] [address], [place], [place] [address]   Appointment on 06/23/2020  Component Date Value Ref Range Status  . Blood Bank Specimen 06/23/2020 SAMPLE AVAILABLE FOR TESTING   Final  . Sample Expiration 06/23/2020    Final                   Value:06/26/2020,2359 Performed at [facility], [place] [address] [place], [place] [address]   . Sodium 06/23/2020 138  135 - 145 mmol/L Final  . Potassium 06/23/2020 3.5  3.5 - 5.1 mmol/L Final  . Chloride 06/23/2020 100  98 - 111 mmol/L Final  . CO2 06/23/2020 24  22 - 32 mmol/L Final  . Glucose, Bld 06/23/2020 291* 70 - 99 mg/dL Final   Glucose reference range applies only to samples taken after fasting for at least 8 hours.  . BUN 06/23/2020 12  8 - 23 mg/dL Final  . Creatinine 06/23/2020 0.71  0.61 - 1.24 mg/dL Final  . Calcium 06/23/2020 7.9* 8.9 - 10.3 mg/dL Final  . Total Protein 06/23/2020 4.5* 6.5 - 8.1 g/dL Final  . Albumin 06/23/2020 2.2* 3.5 - 5.0 g/dL Final  . AST 06/23/2020 13* 15  - 41 U/L Final  . ALT 06/23/2020 <6  0 - 44 U/L Final  . Alkaline Phosphatase 06/23/2020 79  38 - 126 U/L Final  . Total Bilirubin 06/23/2020 0.7  0.3 - 1.2 mg/dL Final  . GFR, Est Non Af Am 06/23/2020 >60  >60 mL/min Final  . GFR, Est AFR Am 06/23/2020 >60  >60 mL/min Final  . Anion gap  06/23/2020 14  5 - 15 Final   Performed at [facility], [place] [address], [place], [place] [address]  . WBC Count 06/23/2020 3.3* 4.0 - 10.5 K/uL Final  . RBC 06/23/2020 2.56* 4.22 - 5.81 MIL/uL Final  . Hemoglobin 06/23/2020 8.0* 13.0 - 17.0 g/dL Final  . HCT 06/23/2020 24.4* 39 - 52 % Final  . MCV 06/23/2020 95.3  80.0 - 100.0 fL Final  . MCH 06/23/2020 31.3  26.0 - 34.0 pg Final  . MCHC 06/23/2020 32.8  30.0 - 36.0 g/dL Final  . RDW 06/23/2020 17.6* 11.5 - 15.5 % Final  . Platelet Count 06/23/2020 110* 150 - 400 K/uL Final  . nRBC 06/23/2020 0.0  0.0 - 0.2 % Final  . Neutrophils Relative % 06/23/2020 75  % Final  . Neutro Abs 06/23/2020 2.5  1.7 - 7.7 K/uL Final  . Lymphocytes Relative 06/23/2020 16  % Final  . Lymphs Abs 06/23/2020 0.5* 0.7 - 4.0 K/uL Final  . Monocytes Relative 06/23/2020 7  % Final  . Monocytes Absolute 06/23/2020 0.2  0 - 1 K/uL Final  . Eosinophils Relative 06/23/2020 0  % Final  . Eosinophils Absolute 06/23/2020 0.0  0 - 0 K/uL Final  . Basophils Relative 06/23/2020 1  % Final  . Basophils Absolute 06/23/2020 0.0  0 - 0 K/uL Final  . Immature Granulocytes 06/23/2020 1  % Final  . Abs Immature Granulocytes 06/23/2020 0.04  0.00 - 0.07 K/uL Final   Performed at [facility], [place] [address], [place], [place] [address]  . LDH 06/23/2020 275* 98 - 192 U/L Final   Performed at [facility], [place] [address] [place], [place] [address]  . Uric Acid, Serum 06/23/2020 <2.0* 3.7 - 8.6 mg/dL Final   Performed at [facility], [place] [address], [place], [place] [address]  . Magnesium  06/23/2020 1.3* 1.7 - 2.4 mg/dL Final   Comment: Performed at [facility], [place] [address] [place], [place] [address] CRITICAL RESULT CALLED TO, READ BACK BY AND VERIFIED WITH: Maricela Bo, RN at 1420 by [place] Visit on 06/21/2020  Component Date Value Ref Range Status  . SURGICAL PATHOLOGY 06/21/2020    Final-Edited                   Value:SURGICAL PATHOLOGY CASE: [id number] PATIENT: [place] Surgical Pathology Report     Clinical History: skin lesion (cm)     FINAL MICROSCOPIC DIAGNOSIS:  A. SKIN, RIGHT CALF, BIOPSY: - Stasis changes and fat necrosis.  COMMENT:  Dermatopathology has reviewed the case.   GROSS DESCRIPTION:  Received in formalin is a 0.4 cm skin punch biopsy measuring 0.5 cm in depth.  The specimen is submitted in toto.  [facility] 06/22/2020)   Final Diagnosis performed by Vicente Males, MD.   Electronically signed 06/26/2020 Technical component performed at [employer]. [facility], [place] [address] [place], [place] [address].  Professional component performed at [facility], [place] [address] [place],  [address].  Immunohistochemistry Technical component (if applicable) was performed at [facility]. [address], [place], [place],  [address].   IMMUNOHISTOCHEMISTRY DISCLAIMER (if applicable): Some of these imm                         unohistochemical stains may have been developed and the performance characteristics  determine by [facility]. Some may not have been cleared or approved by the U.S. Food and Drug Administration. The FDA has determined that such clearance or approval is not necessary. This test is used for clinical purposes. It should not be regarded as investigational or for research. This laboratory is certified under the [place] (CLIA-88) as qualified to perform high complexity  clinical laboratory testing.  The controls stained appropriately.   No results displayed because visit has over 200 results.      Allergies as of 06/26/2020      Reactions   Glycopyrrolate Rash      Medication List       Accurate as of June 26, 2020  3:21 PM. If you have any questions, ask your nurse or doctor.        acetaminophen 500 MG tablet Commonly known as: TYLENOL Take 500 mg by mouth every 6 (six) hours as needed for moderate pain.   allopurinol 100 MG tablet Commonly known as: Zyloprim Take 1 tablet (100 mg total) by mouth 2 (two) times daily.   ALPRAZolam 0.25 MG tablet Commonly known as: XANAX Take 1 tablet (0.25 mg total) by mouth at bedtime as needed for anxiety.   aspirin 81 MG EC tablet Take 81 mg by mouth daily. Swallow whole.   carvedilol 12.5 MG tablet Commonly known as: COREG Take 1 tablet (12.5 mg total) by mouth 2 (two) times daily with a meal.   cephALEXin 500 MG capsule Commonly known as: KEFLEX Take 1 capsule (500 mg total) by mouth 4 (four) times daily for 7 days.   diphenoxylate-atropine 2.5-0.025 MG tablet Commonly known as: LOMOTIL 1 to 2 tablets PO QID prn diarrhea   dronabinol 2.5 MG capsule Commonly known as: MARINOL Take 1 capsule (2.5 mg total) by mouth 2 (two) times daily before a meal.   esomeprazole 40 MG capsule Commonly known as: NEXIUM TAKE 1 CAPSULE BY MOUTH EVERY MORNING 30 MINUTES BEFORE BREAKFAST What changed: See the new instructions.   feeding supplement (GLUCERNA SHAKE) Liqd Take 237 mLs by mouth 3 (three) times daily with meals.   folic acid 1 MG tablet Commonly known as: FOLVITE Take 1 tablet (1 mg total) by mouth daily.   Glucagon Emergency 1 MG Kit Inject 1 mg into the skin once as needed (For severe low blood sugar).   loperamide 2 MG tablet Commonly known as: IMODIUM A-D Take 2 mg by mouth 4 (four) times daily as needed for diarrhea or loose stools.   metFORMIN 500 MG 24 hr tablet Commonly known  as: GLUCOPHAGE-XR Take 1 tablet (500 mg total) by mouth daily with supper.   NovoLOG FlexPen 100 UNIT/ML FlexPen Generic drug: insulin aspart Inject 3-4 Units into the skin 3 (three) times daily with meals. Inject 3-4 units under the skin three times daily before meals.   OneTouch Verio test strip Generic drug: glucose blood USE 1 STRIP TO CHECK GLUCOSE THREE TIMES DAILY   oxyCODONE 5 MG immediate release tablet Commonly known as: Oxy IR/ROXICODONE Take 1 tablet (5 mg total) by mouth every 4 (four) hours as needed for severe pain.   oxymetazoline 0.05 % nasal spray Commonly known as: AFRIN Place 1 spray into both nostrils 2 (two) times daily as needed for congestion.   potassium chloride SA 20 MEQ tablet Commonly known as: KLOR-CON Take 1 tablet (20 mEq total) by mouth 2 (two) times daily.   prochlorperazine 10 MG tablet Commonly known as:  COMPAZINE Take 1 tablet (10 mg total) by mouth every 6 (six) hours as needed for nausea or vomiting.   protein supplement 6 g Powd Commonly known as: RESOURCE BENEPROTEIN Take 2 Scoops (12 g total) by mouth 3 (three) times daily with meals for 7 days.   rosuvastatin 5 MG tablet Commonly known as: CRESTOR TAKE 1 TABLET BY MOUTH EVERY DAY   sacubitril-valsartan 24-26 MG Commonly known as: ENTRESTO Take 1 tablet by mouth 2 (two) times daily.   senna 8.6 MG Tabs tablet Commonly known as: SENOKOT Take 1 tablet (8.6 mg total) by mouth daily.   thiamine 100 MG tablet Take 1 tablet (100 mg total) by mouth daily.   traMADol 50 MG tablet Commonly known as: ULTRAM Take 1 tablet (50 mg total) by mouth every 12 (twelve) hours as needed.   Tyler Aas FlexTouch 100 UNIT/ML FlexTouch Pen Generic drug: insulin degludec inject 7 units daily. if fasting blood sugars are over 140 increase dosage up to 15 units daily as directed.       Allergies:  Allergies  Allergen Reactions  . Glycopyrrolate Rash    Past Medical History:  Diagnosis Date    . CLL (chronic lymphocytic leukemia) ([place]) dx'd 2014   progression 12/2019  . Diabetes mellitus without complication ([place])   . Diverticulosis   . External hemorrhoids   . GERD (gastroesophageal reflux disease)   . Hiatal hernia   . History of colon polyps   . Hyperlipidemia   . Hypertension   . Internal hemorrhoids   . Lymphocytosis   . Schatzki's ring   . Sleep apnea     Past Surgical History:  Procedure Laterality Date  . BLEPHAROPLASTY    . COLONOSCOPY  2012  . HEMORRHOID SURGERY     Sclerotherapy  . KNEE SURGERY Right 2002  . POLYPECTOMY    . SHOULDER SURGERY Right 2007    Family History  Problem Relation Age of Onset  . Kidney disease Mother   . CAD Maternal Aunt   . Colon cancer Neg Hx   . Esophageal cancer Neg Hx   . Rectal cancer Neg Hx   . Stomach cancer Neg Hx     Social History:  reports that he has never smoked. He quit smokeless tobacco use about 7 years ago.  His smokeless tobacco use included chew. He reports current alcohol use of about 6.0 standard drinks of alcohol per week. He reports that he does not use drugs.  Review of Systems:  ANEMIA: This appears to be significantly worse, has had transfusions.  Lab Results  Component Value Date   [id number] 06/06/2020   Lab Results  Component Value Date   HGB 8.0 (L) 06/23/2020     HYPERLIPIDEMIA: The lipid abnormality consists of elevated LDL which is well controlled with 5 mg Crestor   Lab Results  Component Value Date   CHOL 98 06/10/2020   HDL 5 (L) 06/10/2020   LDLCALC 61 06/10/2020   TRIG 160 (H) 06/10/2020   CHOLHDL 19.6 06/10/2020     ROS  CLL: Followed by oncologist   Lab Results  Component Value Date   WBC 3.3 (L) 06/23/2020        Objective:    Physical Exam  BP 130/80 (BP Location: Left Arm, Patient Position: Sitting, Cuff Size: Normal)   Pulse 96   Ht 5' 8.5" (1.74 m)   SpO2 97%   BMI 21.14 kg/m   He appears to have lost weight and has  somewhat sunken  cheeks  He is alert but not fully conversant  Right calf area shows generalized swelling, only dusky discoloration but no erythema or tenderness on the posterior part  No edema on the left ankle    ASSESSMENT/ PLAN:   Malnutrition, weight loss   He has marked decrease in appetite despite using Marinol and is not consuming as much of any fluids also.  Since he is following up with his oncologist tomorrow hopefully this will be addressed  Diabetes type 2, insulin-dependent and nonobese  A1c higher at 7.7 last month  Despite his weight loss and decreased appetite he still has significant hyperglycemia with only occasional readings below 200 apparently  Recommended the following today  Check blood sugars 2-3 times a day  Go up to [place] at least 10 units  Continue to increase 1-2 units every 3 days until morning sugars are at least below 140-150 range  May use NovoLog only if blood sugars are going higher with any foods or supplements especially containing simple sugars are large amount of carbohydrate  His wife will bring the blood sugar readings for review tomorrow   DIARRHEA: This is likely to be secondary to antibiotics and may have C. difficile. We will bring the C. difficile stool test in the meantime we will start him on Flagyl We will defer any further work-up to oncologist including referral to gastroenterologist  Low back pain: He has no tenderness on exam and not clear what the etiology is, We will have him get an x-ray to rule out any obvious bony involvement from his tumor  HYPERTENSION: Resolved with other active problems and will continue him on Entresto  Hypomagnesemia: He is going to have labs done tomorrow with oncologist and likely needs another infusion  Difficulty with urination: Urinalysis will be checked  General medical care: Given him a list of PCPs to establish with as soon as possible  There are no Patient Instructions on file for this  visit.    Elayne Snare 06/26/2020, 3:21 PM

## 2020-06-26 NOTE — Telephone Encounter (Signed)
Persistent diarrhea , now 8 stools /day and night..  Pt has been taking lomotil as ordered.  hallucinating -"sees a girl on phone who is not there. He thought this nephew was present and he wasn't.  I set him straight and then he is okay".  Minimal po intake-She cannot get him to eat or drink.  Per Julien Nordmann I instructed wife to contact PCP with his symptoms . Pt has appt today with Dr Dwyane Dee.

## 2020-06-26 NOTE — Telephone Encounter (Signed)
-----   Message from Carlyle Basques, MD sent at 06/23/2020 11:24 AM EDT ----- Can I see him back at the end of next week. Phone visit is fine

## 2020-06-27 ENCOUNTER — Other Ambulatory Visit: Payer: Self-pay | Admitting: Medical Oncology

## 2020-06-27 ENCOUNTER — Inpatient Hospital Stay: Payer: Medicare HMO

## 2020-06-27 ENCOUNTER — Other Ambulatory Visit: Payer: Self-pay

## 2020-06-27 ENCOUNTER — Other Ambulatory Visit (INDEPENDENT_AMBULATORY_CARE_PROVIDER_SITE_OTHER): Payer: Medicare HMO

## 2020-06-27 ENCOUNTER — Inpatient Hospital Stay: Payer: Medicare HMO | Admitting: Internal Medicine

## 2020-06-27 ENCOUNTER — Encounter: Payer: Self-pay | Admitting: Internal Medicine

## 2020-06-27 VITALS — BP 115/57 | HR 83 | Temp 97.9°F | Resp 17 | Ht 68.5 in

## 2020-06-27 DIAGNOSIS — E860 Dehydration: Secondary | ICD-10-CM | POA: Insufficient documentation

## 2020-06-27 DIAGNOSIS — R739 Hyperglycemia, unspecified: Secondary | ICD-10-CM

## 2020-06-27 DIAGNOSIS — D649 Anemia, unspecified: Secondary | ICD-10-CM

## 2020-06-27 DIAGNOSIS — K633 Ulcer of intestine: Secondary | ICD-10-CM | POA: Diagnosis not present

## 2020-06-27 DIAGNOSIS — C9110 Chronic lymphocytic leukemia of B-cell type not having achieved remission: Secondary | ICD-10-CM

## 2020-06-27 DIAGNOSIS — R531 Weakness: Secondary | ICD-10-CM | POA: Diagnosis not present

## 2020-06-27 DIAGNOSIS — R300 Dysuria: Secondary | ICD-10-CM | POA: Diagnosis not present

## 2020-06-27 LAB — URINALYSIS, ROUTINE W REFLEX MICROSCOPIC
Bilirubin Urine: NEGATIVE
Hgb urine dipstick: NEGATIVE
Ketones, ur: >=80 — AB
Leukocytes,Ua: NEGATIVE
Nitrite: NEGATIVE
RBC / HPF: NONE SEEN (ref 0–?)
Specific Gravity, Urine: 1.02 (ref 1.000–1.030)
Urine Glucose: 500 — AB
Urobilinogen, UA: 0.2 (ref 0.0–1.0)
pH: 6 (ref 5.0–8.0)

## 2020-06-27 LAB — CBC WITH DIFFERENTIAL (CANCER CENTER ONLY)
Abs Immature Granulocytes: 0.07 10*3/uL (ref 0.00–0.07)
Basophils Absolute: 0 10*3/uL (ref 0.0–0.1)
Basophils Relative: 0 %
Eosinophils Absolute: 0 10*3/uL (ref 0.0–0.5)
Eosinophils Relative: 1 %
HCT: 26.2 % — ABNORMAL LOW (ref 39.0–52.0)
Hemoglobin: 8.8 g/dL — ABNORMAL LOW (ref 13.0–17.0)
Immature Granulocytes: 1 %
Lymphocytes Relative: 21 %
Lymphs Abs: 1.2 10*3/uL (ref 0.7–4.0)
MCH: 31 pg (ref 26.0–34.0)
MCHC: 33.6 g/dL (ref 30.0–36.0)
MCV: 92.3 fL (ref 80.0–100.0)
Monocytes Absolute: 0.5 10*3/uL (ref 0.1–1.0)
Monocytes Relative: 9 %
Neutro Abs: 3.9 10*3/uL (ref 1.7–7.7)
Neutrophils Relative %: 68 %
Platelet Count: 183 10*3/uL (ref 150–400)
RBC: 2.84 MIL/uL — ABNORMAL LOW (ref 4.22–5.81)
RDW: 17.9 % — ABNORMAL HIGH (ref 11.5–15.5)
WBC Count: 5.8 10*3/uL (ref 4.0–10.5)
nRBC: 0 % (ref 0.0–0.2)

## 2020-06-27 LAB — CMP (CANCER CENTER ONLY)
ALT: 7 U/L (ref 0–44)
AST: 13 U/L — ABNORMAL LOW (ref 15–41)
Albumin: 2.3 g/dL — ABNORMAL LOW (ref 3.5–5.0)
Alkaline Phosphatase: 74 U/L (ref 38–126)
Anion gap: 13 (ref 5–15)
BUN: 12 mg/dL (ref 8–23)
CO2: 22 mmol/L (ref 22–32)
Calcium: 8.3 mg/dL — ABNORMAL LOW (ref 8.9–10.3)
Chloride: 103 mmol/L (ref 98–111)
Creatinine: 0.74 mg/dL (ref 0.61–1.24)
GFR, Est AFR Am: >60 mL/min (ref >60)
GFR, Estimated: >60 mL/min (ref >60)
Glucose, Bld: 123 mg/dL — ABNORMAL HIGH (ref 70–99)
Potassium: 3.2 mmol/L — ABNORMAL LOW (ref 3.5–5.1)
Sodium: 138 mmol/L (ref 135–145)
Total Bilirubin: 0.9 mg/dL (ref 0.3–1.2)
Total Protein: 4.7 g/dL — ABNORMAL LOW (ref 6.5–8.1)

## 2020-06-27 LAB — SAMPLE TO BLOOD BANK

## 2020-06-27 LAB — MAGNESIUM: Magnesium: 1.3 mg/dL — CL (ref 1.7–2.4)

## 2020-06-27 MED ORDER — SODIUM CHLORIDE 0.9 % IV SOLN
Freq: Once | INTRAVENOUS | Status: AC
  Filled 2020-06-27: qty 10

## 2020-06-27 MED ORDER — SODIUM CHLORIDE 0.9 % IV SOLN
Freq: Once | INTRAVENOUS | Status: AC
  Filled 2020-06-27: qty 250

## 2020-06-27 MED FILL — Dexamethasone Sodium Phosphate Inj 100 MG/10ML: INTRAMUSCULAR | Qty: 1 | Status: AC

## 2020-06-27 NOTE — Addendum Note (Signed)
Addended by: Reyne Dumas E on: 06/27/2020 11:40 AM   Modules accepted: Orders

## 2020-06-27 NOTE — Patient Instructions (Signed)

## 2020-06-27 NOTE — Progress Notes (Signed)
[facility] [phone]   [phone]  OFFICE PROGRESS NOTE  Elayne Snare, MD [address] [place] [address]  DIAGNOSIS: Chronic lymphocytic leukemia diagnosed in October 2014.  PRIOR THERAPY: None  CURRENT THERAPY: Systemic chemotherapy with bendamustine 90 mg/M2 on days 1 and 2 and Rituxan 375 mg/M2 on day 1 every 4 weeks.  First dose 04/04/2020.  Status post 3 cycles.  INTERVAL HISTORY: KAYLEN NGHIEM 79 y.o. male returns to the clinic today for follow-up visit accompanied by his wife.  The patient was admitted to the hospital few weeks ago after cycle #3 with diabetic ketoacidosis and questionable sepsis.  He stayed in the hospital for more than 2 weeks.  After discharge he continues to have significant weakness and fatigue as well as persistent diarrhea.  He was seen by his primary care physician Dr. Dwyane Dee who consider him for testing with C. difficile but the patient was unable to give a stool sample.  He continues to have confusion as well as generalized weakness and fatigue.  He has diarrhea up to 10 times a day.  He was seen at the symptom management few days ago and given prescription for Lomotil.  The patient has lack of appetite and lost a lot of weight.  He is here today for reevaluation and repeat blood work.  MEDICAL HISTORY: Past Medical History:  Diagnosis Date  . CLL (chronic lymphocytic leukemia) ([facility]) dx'd 2014   progression 12/2019  . Diabetes mellitus without complication ([facility])   . Diverticulosis   . External hemorrhoids   . GERD (gastroesophageal reflux disease)   . Hiatal hernia   . History of colon polyps   . Hyperlipidemia   . Hypertension   . Internal hemorrhoids   . Lymphocytosis   . Schatzki's ring   . Sleep apnea     ALLERGIES:  is allergic to glycopyrrolate.  MEDICATIONS:  Current Outpatient Medications  Medication Sig Dispense Refill  . acetaminophen (TYLENOL) 500 MG tablet Take 500 mg by  mouth every 6 (six) hours as needed for moderate pain.    Marland Kitchen allopurinol (ZYLOPRIM) 100 MG tablet Take 1 tablet (100 mg total) by mouth 2 (two) times daily. 60 tablet 2  . ALPRAZolam (XANAX) 0.25 MG tablet Take 1 tablet (0.25 mg total) by mouth at bedtime as needed for anxiety. 30 tablet 0  . aspirin 81 MG EC tablet Take 81 mg by mouth daily. Swallow whole.    . carvedilol (COREG) 12.5 MG tablet Take 1 tablet (12.5 mg total) by mouth 2 (two) times daily with a meal. 60 tablet 0  . cephALEXin (KEFLEX) 500 MG capsule Take 1 capsule (500 mg total) by mouth 4 (four) times daily for 7 days. 28 capsule 0  . diphenoxylate-atropine (LOMOTIL) 2.5-0.025 MG tablet 1 to 2 tablets PO QID prn diarrhea 45 tablet 1  . dronabinol (MARINOL) 2.5 MG capsule Take 1 capsule (2.5 mg total) by mouth 2 (two) times daily before a meal. 30 capsule 0  . esomeprazole (NEXIUM) 40 MG capsule TAKE 1 CAPSULE BY MOUTH EVERY MORNING 30 MINUTES BEFORE BREAKFAST (Patient taking differently: Take 40 mg by mouth daily. ) 90 capsule 0  . feeding supplement, GLUCERNA SHAKE, (GLUCERNA SHAKE) LIQD Take 237 mLs by mouth 3 (three) times daily with meals. 08811 mL 0  . folic acid (FOLVITE) 1 MG tablet Take 1 tablet (1 mg total) by mouth daily. 30 tablet 0  . glucagon (GLUCAGON  EMERGENCY) 1 MG injection Inject 1 mg into the skin once as needed (For severe low blood sugar). 1 each 5  . insulin aspart (NOVOLOG FLEXPEN) 100 UNIT/ML FlexPen Inject 3-4 Units into the skin 3 (three) times daily with meals. Inject 3-4 units under the skin three times daily before meals.     . insulin degludec (TRESIBA FLEXTOUCH) 100 UNIT/ML FlexTouch Pen inject 7 units daily. if fasting blood sugars are over 140 increase dosage up to 15 units daily as directed. 4 pen 4  . loperamide (IMODIUM A-D) 2 MG tablet Take 2 mg by mouth 4 (four) times daily as needed for diarrhea or loose stools.    . metFORMIN (GLUCOPHAGE-XR) 500 MG 24 hr tablet Take 1 tablet (500 mg total) by  mouth daily with supper. (Patient not taking: Reported on 06/26/2020) 90 tablet 3  . metroNIDAZOLE (FLAGYL) 250 MG tablet Take 1 tablet (250 mg total) by mouth 3 (three) times daily. 21 tablet 0  . ONETOUCH VERIO test strip USE 1 STRIP TO CHECK GLUCOSE THREE TIMES DAILY 100 each 5  . oxyCODONE (OXY IR/ROXICODONE) 5 MG immediate release tablet Take 1 tablet (5 mg total) by mouth every 4 (four) hours as needed for severe pain. 45 tablet 0  . oxymetazoline (AFRIN) 0.05 % nasal spray Place 1 spray into both nostrils 2 (two) times daily as needed for congestion.    . potassium chloride SA (KLOR-CON) 20 MEQ tablet Take 1 tablet (20 mEq total) by mouth 2 (two) times daily. 14 tablet 0  . prochlorperazine (COMPAZINE) 10 MG tablet Take 1 tablet (10 mg total) by mouth every 6 (six) hours as needed for nausea or vomiting. 30 tablet 0  . protein supplement (RESOURCE BENEPROTEIN) 6 g POWD Take 2 Scoops (12 g total) by mouth 3 (three) times daily with meals for 7 days. 252 g 0  . rosuvastatin (CRESTOR) 5 MG tablet TAKE 1 TABLET BY MOUTH EVERY DAY 90 tablet 0  . sacubitril-valsartan (ENTRESTO) 24-26 MG Take 1 tablet by mouth 2 (two) times daily. 60 tablet 0  . senna (SENOKOT) 8.6 MG TABS tablet Take 1 tablet (8.6 mg total) by mouth daily. 120 tablet 0  . thiamine 100 MG tablet Take 1 tablet (100 mg total) by mouth daily. 30 tablet 0  . traMADol (ULTRAM) 50 MG tablet Take 1 tablet (50 mg total) by mouth every 12 (twelve) hours as needed. 30 tablet 0   No current facility-administered medications for this visit.    SURGICAL HISTORY:  Past Surgical History:  Procedure Laterality Date  . BLEPHAROPLASTY    . COLONOSCOPY  2012  . HEMORRHOID SURGERY     Sclerotherapy  . KNEE SURGERY Right 2002  . POLYPECTOMY    . SHOULDER SURGERY Right 2007    REVIEW OF SYSTEMS:  Constitutional: positive for anorexia, fatigue and weight loss Eyes: negative Ears, nose, mouth, throat, and face: negative Respiratory:  negative Cardiovascular: negative Gastrointestinal: positive for diarrhea Genitourinary:negative Integument/breast: negative Hematologic/lymphatic: negative Musculoskeletal:negative Neurological: negative Behavioral/Psych: negative Endocrine: negative Allergic/Immunologic: negative   PHYSICAL EXAMINATION: General appearance: alert, cooperative, fatigued and no distress Head: Normocephalic, without obvious abnormality, atraumatic Neck: no JVD, supple, symmetrical, trachea midline and thyroid not enlarged, symmetric, no tenderness/mass/nodules Lymph nodes: Cervical, supraclavicular, and axillary nodes normal. Resp: clear to auscultation bilaterally Back: symmetric, no curvature. ROM normal. No CVA tenderness. Cardio: regular rate and rhythm, S1, S2 normal, no murmur, click, rub or gallop GI: soft, non-tender; bowel sounds normal; no masses,  no  organomegaly Extremities: extremities normal, atraumatic, no cyanosis or edema Neurologic: Alert and oriented X 3, normal strength and tone. Normal symmetric reflexes. Normal coordination and gait  ECOG PERFORMANCE STATUS: 2 - Symptomatic, <50% confined to bed  Blood pressure (!) 115/57, pulse 83, temperature 97.9 F (36.6 C), temperature source Temporal, resp. rate 17, height 5' 8.5" (1.74 m), SpO2 97 %.  LABORATORY DATA: Lab Results  Component Value Date   WBC 5.8 06/27/2020   HGB 8.8 (L) 06/27/2020   HCT 26.2 (L) 06/27/2020   MCV 92.3 06/27/2020   PLT 183 06/27/2020      Chemistry      Component Value Date/Time   NA 138 06/23/2020 1226   NA 141 08/06/2017 1306   K 3.5 06/23/2020 1226   K 4.3 08/06/2017 1306   CL 100 06/23/2020 1226   CO2 24 06/23/2020 1226   CO2 25 08/06/2017 1306   BUN 12 06/23/2020 1226   BUN 22.9 08/06/2017 1306   CREATININE 0.71 06/23/2020 1226   CREATININE 1.3 08/06/2017 1306      Component Value Date/Time   CALCIUM 7.9 (L) 06/23/2020 1226   CALCIUM 9.3 08/06/2017 1306   ALKPHOS 79 06/23/2020 1226    ALKPHOS 47 08/06/2017 1306   AST 13 (L) 06/23/2020 1226   AST 20 08/06/2017 1306   ALT <6 06/23/2020 1226   ALT 19 08/06/2017 1306   BILITOT 0.7 06/23/2020 1226   BILITOT 0.46 08/06/2017 1306       RADIOGRAPHIC STUDIES: CT ABDOMEN PELVIS WO CONTRAST  Result Date: 06/07/2020 CLINICAL DATA:  Respiratory failure and abdominal pain with fever. History of colonic polyps and diverticulosis. History of CLL. EXAM: CT CHEST, ABDOMEN AND PELVIS WITHOUT CONTRAST TECHNIQUE: Multidetector CT imaging of the chest, abdomen and pelvis was performed following the standard protocol without IV contrast. COMPARISON:  Chest CTA 05/16/2020.  Abdominopelvic CT 04/05/2020. FINDINGS: CT CHEST FINDINGS Cardiovascular: Right arm PICC extends to the superior cavoatrial junction. There is diffuse atherosclerosis of the aorta, great vessels and coronary arteries. The heart is mildly enlarged. No significant pericardial fluid. Mediastinum/Nodes: There are no enlarged mediastinal, hilar or axillary lymph nodes. The thyroid gland, trachea and esophagus demonstrate no significant findings. Lungs/Pleura: There are new small bilateral pleural effusions with dependent opacities in both lungs, probably atelectasis. These obscure the previously demonstrated nodular densities in both lower lobes. These were not present on the abdominal CT from April and were probably inflammatory. No consolidation or new abnormality identified. There is mild central airway thickening. Musculoskeletal/Chest wall: No chest wall mass or suspicious osseous findings. CT ABDOMEN AND PELVIS FINDINGS Hepatobiliary: No focal hepatic abnormalities are identified on noncontrast imaging. The gallbladder is mildly distended without evidence of gallstones, wall thickening or focal surrounding inflammation. Pancreas: No focal pancreatic abnormality or surrounding inflammation identified. Spleen: Resolution of previously demonstrated splenomegaly. No focal abnormality  identified. Adrenals/Urinary Tract: Both adrenal glands appear normal. Stable dominant cyst in the lower interpolar region of the right kidney. No evidence of urinary tract calculus or hydronephrosis. New symmetric perinephric soft tissue stranding bilaterally without focal fluid collection. The bladder is decompressed by a Foley catheter. There is a small amount of air in the bladder lumen. Possible mild bladder wall thickening. Stomach/Bowel: Enteric contrast is present within the colon, but there is none in the stomach or small bowel. The stomach and small bowel are decompressed without apparent focal wall thickening. The appendix appears normal. Diverticular changes are present within the descending and sigmoid colon. No evidence of  bowel obstruction or perforation. Vascular/Lymphatic: Again demonstrated are several mildly enlarged retroperitoneal and left pelvic lymph nodes, similar to the prior CT and presumably related to the patient's CLL. No progressive adenopathy identified. Diffuse aortic and branch vessel atherosclerosis without acute vascular findings on noncontrast imaging. Reproductive: Central prostatic calcifications. Other: New small amount of ascites around the liver and in both pericolic gutters and the pelvis. No focal extraluminal fluid collection or extravasated enteric contrast. There is generalized edema throughout the intra-abdominal and subcutaneous fat. There is a stable small umbilical hernia containing only fat. Musculoskeletal: No acute or significant osseous findings. IMPRESSION: 1. New small bilateral pleural effusions, ascites and generalized edema throughout the intra-abdominal and subcutaneous fat, consistent with anasarca. No focal extraluminal fluid collection or extravasated enteric contrast. 2. New symmetric perinephric soft tissue stranding bilaterally without focal fluid collection. The bladder is decompressed by a Foley catheter. Possible mild bladder wall thickening.  Correlate with urinalysis. 3. Probable atelectasis at both lung bases adjacent to the pleural effusions. No evidence of pneumonia. 4. Stable mildly enlarged retroperitoneal and left pelvic lymph nodes, presumably related to the patient's chronic lymphocytic leukemia. No significant residual splenomegaly. 5. Aortic Atherosclerosis (ICD10-I70.0). Electronically Signed   By: Richardean Sale M.D.   On: 06/07/2020 14:08   CT Head Wo Contrast  Result Date: 06/05/2020 CLINICAL DATA:  Altered mental status fatigue and weakness EXAM: CT HEAD WITHOUT CONTRAST TECHNIQUE: Contiguous axial images were obtained from the base of the skull through the vertex without intravenous contrast. COMPARISON:  MRI 11/07/2014 FINDINGS: Brain: No acute territorial infarction, hemorrhage, or intracranial mass. Mild to moderate atrophy. Mild hypodensity in the white matter consistent with chronic small vessel ischemic change. Prominent ventricle size, likely due to atrophy. Vascular: No hyperdense vessels.  No unexpected calcification Skull: Motion artifact.  No gross fracture Sinuses/Orbits: No acute finding. Other: None IMPRESSION: 1. No CT evidence for acute intracranial abnormality. 2. Atrophy and mild chronic small vessel ischemic changes of the white matter. Electronically Signed   By: Donavan Foil M.D.   On: 06/05/2020 17:29   CT CHEST WO CONTRAST  Result Date: 06/07/2020 CLINICAL DATA:  Respiratory failure and abdominal pain with fever. History of colonic polyps and diverticulosis. History of CLL. EXAM: CT CHEST, ABDOMEN AND PELVIS WITHOUT CONTRAST TECHNIQUE: Multidetector CT imaging of the chest, abdomen and pelvis was performed following the standard protocol without IV contrast. COMPARISON:  Chest CTA 05/16/2020.  Abdominopelvic CT 04/05/2020. FINDINGS: CT CHEST FINDINGS Cardiovascular: Right arm PICC extends to the superior cavoatrial junction. There is diffuse atherosclerosis of the aorta, great vessels and coronary  arteries. The heart is mildly enlarged. No significant pericardial fluid. Mediastinum/Nodes: There are no enlarged mediastinal, hilar or axillary lymph nodes. The thyroid gland, trachea and esophagus demonstrate no significant findings. Lungs/Pleura: There are new small bilateral pleural effusions with dependent opacities in both lungs, probably atelectasis. These obscure the previously demonstrated nodular densities in both lower lobes. These were not present on the abdominal CT from April and were probably inflammatory. No consolidation or new abnormality identified. There is mild central airway thickening. Musculoskeletal/Chest wall: No chest wall mass or suspicious osseous findings. CT ABDOMEN AND PELVIS FINDINGS Hepatobiliary: No focal hepatic abnormalities are identified on noncontrast imaging. The gallbladder is mildly distended without evidence of gallstones, wall thickening or focal surrounding inflammation. Pancreas: No focal pancreatic abnormality or surrounding inflammation identified. Spleen: Resolution of previously demonstrated splenomegaly. No focal abnormality identified. Adrenals/Urinary Tract: Both adrenal glands appear normal. Stable dominant cyst in the  lower interpolar region of the right kidney. No evidence of urinary tract calculus or hydronephrosis. New symmetric perinephric soft tissue stranding bilaterally without focal fluid collection. The bladder is decompressed by a Foley catheter. There is a small amount of air in the bladder lumen. Possible mild bladder wall thickening. Stomach/Bowel: Enteric contrast is present within the colon, but there is none in the stomach or small bowel. The stomach and small bowel are decompressed without apparent focal wall thickening. The appendix appears normal. Diverticular changes are present within the descending and sigmoid colon. No evidence of bowel obstruction or perforation. Vascular/Lymphatic: Again demonstrated are several mildly enlarged  retroperitoneal and left pelvic lymph nodes, similar to the prior CT and presumably related to the patient's CLL. No progressive adenopathy identified. Diffuse aortic and branch vessel atherosclerosis without acute vascular findings on noncontrast imaging. Reproductive: Central prostatic calcifications. Other: New small amount of ascites around the liver and in both pericolic gutters and the pelvis. No focal extraluminal fluid collection or extravasated enteric contrast. There is generalized edema throughout the intra-abdominal and subcutaneous fat. There is a stable small umbilical hernia containing only fat. Musculoskeletal: No acute or significant osseous findings. IMPRESSION: 1. New small bilateral pleural effusions, ascites and generalized edema throughout the intra-abdominal and subcutaneous fat, consistent with anasarca. No focal extraluminal fluid collection or extravasated enteric contrast. 2. New symmetric perinephric soft tissue stranding bilaterally without focal fluid collection. The bladder is decompressed by a Foley catheter. Possible mild bladder wall thickening. Correlate with urinalysis. 3. Probable atelectasis at both lung bases adjacent to the pleural effusions. No evidence of pneumonia. 4. Stable mildly enlarged retroperitoneal and left pelvic lymph nodes, presumably related to the patient's chronic lymphocytic leukemia. No significant residual splenomegaly. 5. Aortic Atherosclerosis (ICD10-I70.0). Electronically Signed   By: Richardean Sale M.D.   On: 06/07/2020 14:08   DG CHEST PORT 1 VIEW  Result Date: 06/10/2020 CLINICAL DATA:  Respiratory abnormality. EXAM: PORTABLE CHEST 1 VIEW COMPARISON:  June 08, 2020 FINDINGS: A right PICC line terminates in the central SVC. Haziness in the lung bases is identified. Mild cardiomegaly. The hila and mediastinum are unremarkable. No nodules or masses. No overt edema. IMPRESSION: 1. Increasing hazy opacities in the lung bases could represent developing  infiltrate or layering effusions with underlying atelectasis. Recommend clinical correlation and attention on follow-up. 2. The right PICC line remains in good position. 3. No acute interval changes otherwise seen. Electronically Signed   By: Dorise Bullion III M.D   On: 06/10/2020 06:41   DG CHEST PORT 1 VIEW  Result Date: 06/08/2020 CLINICAL DATA:  Sepsis. EXAM: PORTABLE CHEST 1 VIEW COMPARISON:  CT report 06/07/2020.  Chest x-ray 06/07/2020. FINDINGS: Right PICC line noted with tip at cavoatrial junction. Heart size stable. Mild bibasilar atelectasis. No pleural effusion or pneumothorax. Stable small calcific density noted the right humeral head, most likely a bone island. IMPRESSION: 1.  Right PICC line noted with tip at cavoatrial junction. 2.  Low lung volumes.  Mild bibasilar atelectasis. Electronically Signed   By: Marcello Moores  Register   On: 06/08/2020 07:25   DG Chest Port 1 View  Result Date: 06/07/2020 CLINICAL DATA:  Respiratory failure EXAM: PORTABLE CHEST 1 VIEW COMPARISON:  06/06/2020 FINDINGS: Cardiac shadow is at the upper limits of normal in size and stable. Aortic calcifications are again seen. The lungs are well aerated bilaterally. No focal infiltrate or sizable effusion is seen. No bony abnormality is noted. IMPRESSION: No acute abnormality seen. Electronically Signed   By:  Inez Catalina M.D.   On: 06/07/2020 09:03   DG CHEST PORT 1 VIEW  Result Date: 06/06/2020 CLINICAL DATA:  Possible pneumonia EXAM: PORTABLE CHEST 1 VIEW COMPARISON:  06/05/2020 FINDINGS: Cardiac shadow is stable. Tortuous aorta with calcifications is again seen. Lungs are well aerated bilaterally. No focal infiltrate or sizable effusion is seen. Some accentuation of the mediastinum is noted due to patient rotation. IMPRESSION: No acute abnormality noted. Electronically Signed   By: Inez Catalina M.D.   On: 06/06/2020 13:25   DG Chest Portable 1 View  Result Date: 06/05/2020 CLINICAL DATA:  Fatigue and weakness.  EXAM: PORTABLE CHEST 1 VIEW COMPARISON:  May 16, 2020 FINDINGS: The study is limited secondary to patient rotation. There is no evidence of acute infiltrate, pleural effusion or pneumothorax. The heart size and mediastinal contours are within normal limits. A chronic tenth left rib fracture is seen. The visualized skeletal structures are otherwise unremarkable. IMPRESSION: No active disease. Electronically Signed   By: Virgina Norfolk M.D.   On: 06/05/2020 16:07   ECHOCARDIOGRAM COMPLETE  Result Date: 06/07/2020    ECHOCARDIOGRAM REPORT   Patient Name:   Tony Marshall Falcon Date of Exam: 06/07/2020 Medical Rec #:  [id number]         Height:       68.5 in Accession #:    [id number]        Weight:       130.1 lb Date of Birth:  03-Nov-1941         BSA:          79.711 m Patient Age:    78 years          BP:           93/60 mmHg Patient Gender: M                 HR:           69 bpm. Exam Location:  Inpatient Procedure: 2D Echo, Cardiac Doppler, Color Doppler and Intracardiac            Opacification Agent Indications:    Chemo V67.2 / Z09                 Fever 780.6 / R50.9  History:        Patient has no prior history of Echocardiogram examinations.                 Risk Factors:Hypertension, Diabetes, Dyslipidemia and                 Non-Smoker. GERD.  Sonographer:    Vickie Epley RDCS Referring Phys: [id number] Arial  1. Left ventricular ejection fraction, by estimation, is 25 to 30%. The left ventricle has severely decreased function. The left ventricle has no regional wall motion abnormalities. Left ventricular diastolic parameters are consistent with Grade II diastolic dysfunction (pseudonormalization).  2. Right ventricular systolic function is normal. The right ventricular size is normal. There is normal pulmonary artery systolic pressure.  3. The mitral valve is normal in structure. No evidence of mitral valve regurgitation. No evidence of mitral stenosis.  4. The aortic valve is grossly  normal. Aortic valve regurgitation is not visualized. No aortic stenosis is present.  5. The inferior vena cava is normal in size with greater than 50% respiratory variability, suggesting right atrial pressure of 3 mmHg. FINDINGS  Left Ventricle: Left ventricular ejection fraction, by estimation, is 25 to 30%. The left  ventricle has severely decreased function. The left ventricle has no regional wall motion abnormalities. Definity contrast agent was given IV to delineate the left  ventricular endocardial borders. The left ventricular internal cavity size was normal in size. There is no left ventricular hypertrophy. Left ventricular diastolic parameters are consistent with Grade II diastolic dysfunction (pseudonormalization). Right Ventricle: The right ventricular size is normal. No increase in right ventricular wall thickness. Right ventricular systolic function is normal. There is normal pulmonary artery systolic pressure. The tricuspid regurgitant velocity is 1.96 m/s, and  with an assumed right atrial pressure of 3 mmHg, the estimated right ventricular systolic pressure is 68.3 mmHg. Left Atrium: Left atrial size was normal in size. Right Atrium: Right atrial size was normal in size. Pericardium: There is no evidence of pericardial effusion. Mitral Valve: The mitral valve is normal in structure. No evidence of mitral valve regurgitation. No evidence of mitral valve stenosis. Tricuspid Valve: The tricuspid valve is normal in structure. Tricuspid valve regurgitation is mild . No evidence of tricuspid stenosis. Aortic Valve: The aortic valve is grossly normal. Aortic valve regurgitation is not visualized. No aortic stenosis is present. Pulmonic Valve: The pulmonic valve was normal in structure. Pulmonic valve regurgitation is not visualized. No evidence of pulmonic stenosis. Aorta: The aortic root and ascending aorta are structurally normal, with no evidence of dilitation. Venous: The inferior vena cava is normal in  size with greater than 50% respiratory variability, suggesting right atrial pressure of 3 mmHg. IAS/Shunts: The atrial septum is grossly normal.  LEFT VENTRICLE PLAX 2D LVIDd:         4.40 cm      Diastology LVIDs:         3.70 cm      LV e' lateral:   7.27 cm/s LV PW:         0.70 cm      LV E/e' lateral: 10.3 LV IVS:        0.70 cm      LV e' medial:    6.57 cm/s LVOT diam:     1.80 cm      LV E/e' medial:  11.4 LV SV:         26 LV SV Index:   15 LVOT Area:     2.54 cm  LV Volumes (MOD) LV vol d, MOD A2C: 126.0 ml LV vol d, MOD A4C: 113.0 ml LV vol s, MOD A2C: 85.1 ml LV vol s, MOD A4C: 60.7 ml LV SV MOD A2C:     40.9 ml LV SV MOD A4C:     113.0 ml LV SV MOD BP:      44.6 ml RIGHT VENTRICLE RV S prime:     7.97 cm/s TAPSE (M-mode): 1.6 cm LEFT ATRIUM             Index       RIGHT ATRIUM           Index LA diam:        3.10 cm 1.81 cm/m  RA Area:     13.40 cm LA Vol (A2C):   28.0 ml 16.36 ml/m RA Volume:   31.10 ml  18.17 ml/m LA Vol (A4C):   23.0 ml 13.44 ml/m LA Biplane Vol: 27.5 ml 16.07 ml/m  AORTIC VALVE LVOT Vmax:   58.50 cm/s LVOT Vmean:  39.600 cm/s LVOT VTI:    0.102 m  AORTA Ao Root diam: 3.40 cm MITRAL VALVE  TRICUSPID VALVE MV Area (PHT): 4.06 cm    TR Peak grad:   15.4 mmHg MV Decel Time: 187 msec    TR Vmax:        196.00 cm/s MV E velocity: 75.00 cm/s MV A velocity: 72.40 cm/s  SHUNTS MV E/A ratio:  1.04        Systemic VTI:  0.10 m                            Systemic Diam: 1.80 cm Mertie Moores MD Electronically signed by Mertie Moores MD Signature Date/Time: 06/07/2020/12:52:20 PM    Final    VAS [place] LOWER EXTREMITY VENOUS (DVT)  Result Date: 06/11/2020  Lower Venous DVTStudy Indications: Swelling.  Risk Factors: None identified. Comparison Study: 06/07/2020 - Negative for DVT. Performing Technologist: Oliver Hum RVT  Examination Guidelines: A complete evaluation includes B-mode imaging, spectral Doppler, color Doppler, and power Doppler as needed of all accessible  portions of each vessel. Bilateral testing is considered an integral part of a complete examination. Limited examinations for reoccurring indications may be performed as noted. The reflux portion of the exam is performed with the patient in reverse Trendelenburg.  +---------+---------------+---------+-----------+----------+--------------+ RIGHT    CompressibilityPhasicitySpontaneityPropertiesThrombus Aging +---------+---------------+---------+-----------+----------+--------------+ CFV      Full           Yes      Yes                                 +---------+---------------+---------+-----------+----------+--------------+ SFJ      Full                                                        +---------+---------------+---------+-----------+----------+--------------+ FV Prox  Full                                                        +---------+---------------+---------+-----------+----------+--------------+ FV Mid   Full                                                        +---------+---------------+---------+-----------+----------+--------------+ FV DistalFull                                                        +---------+---------------+---------+-----------+----------+--------------+ PFV      Full                                                        +---------+---------------+---------+-----------+----------+--------------+ POP      Full           Yes  Yes                                 +---------+---------------+---------+-----------+----------+--------------+ PTV      Full                                                        +---------+---------------+---------+-----------+----------+--------------+ PERO     Full                                                        +---------+---------------+---------+-----------+----------+--------------+   +----+---------------+---------+-----------+----------+--------------+  LEFTCompressibilityPhasicitySpontaneityPropertiesThrombus Aging +----+---------------+---------+-----------+----------+--------------+ CFV Full           Yes      Yes                                 +----+---------------+---------+-----------+----------+--------------+     Summary: RIGHT: - There is no evidence of deep vein thrombosis in the lower extremity.  - No cystic structure found in the popliteal fossa.  LEFT: - No evidence of common femoral vein obstruction.  *See table(s) above for measurements and observations. Electronically signed by Deitra Mayo MD on 06/11/2020 at 6:52:41 PM.    Final    VAS [place] LOWER EXTREMITY VENOUS (DVT)  Result Date: 06/07/2020  Lower Venous DVTStudy Indications: Pain.  Risk Factors: None identified. Limitations: Poor ultrasound/tissue interface. Comparison Study: No prior studies. Performing Technologist: Oliver Hum RVT  Examination Guidelines: A complete evaluation includes B-mode imaging, spectral Doppler, color Doppler, and power Doppler as needed of all accessible portions of each vessel. Bilateral testing is considered an integral part of a complete examination. Limited examinations for reoccurring indications may be performed as noted. The reflux portion of the exam is performed with the patient in reverse Trendelenburg.  +---------+---------------+---------+-----------+----------+--------------+ RIGHT    CompressibilityPhasicitySpontaneityPropertiesThrombus Aging +---------+---------------+---------+-----------+----------+--------------+ CFV      Full           Yes      Yes                                 +---------+---------------+---------+-----------+----------+--------------+ SFJ      Full                                                        +---------+---------------+---------+-----------+----------+--------------+ FV Prox  Full                                                         +---------+---------------+---------+-----------+----------+--------------+ FV Mid   Full                                                        +---------+---------------+---------+-----------+----------+--------------+  FV DistalFull                                                        +---------+---------------+---------+-----------+----------+--------------+ PFV      Full                                                        +---------+---------------+---------+-----------+----------+--------------+ POP      Full           Yes      Yes                                 +---------+---------------+---------+-----------+----------+--------------+ PTV      Full                                                        +---------+---------------+---------+-----------+----------+--------------+ PERO     Full                                                        +---------+---------------+---------+-----------+----------+--------------+   +----+---------------+---------+-----------+----------+--------------+ LEFTCompressibilityPhasicitySpontaneityPropertiesThrombus Aging +----+---------------+---------+-----------+----------+--------------+ CFV Full           Yes      Yes                                 +----+---------------+---------+-----------+----------+--------------+     Summary: RIGHT: - There is no evidence of deep vein thrombosis in the lower extremity.  - No cystic structure found in the popliteal fossa.  LEFT: - No evidence of common femoral vein obstruction.  *See table(s) above for measurements and observations. Electronically signed by Curt Jews MD on 06/07/2020 at 3:44:25 PM.    Final    [place] EKG SITE RITE  Result Date: 06/07/2020 If Site Rite image not attached, placement could not be confirmed due to current cardiac rhythm.   ASSESSMENT AND PLAN:  This is a very pleasant 79 years old white male with chronic lymphocytic leukemia currently on  observation. The patient felt a mass in the left posterior neck area over the last few weeks but started getting smaller in size. The patient had further increase in his total white blood count close to 100,000.  He also started having worsening anemia likely secondary to the underlying CLL.  His platelets count are still normal. The patient is started systemic chemotherapy with bendamustine and Rituxan status post 3 cycles.  The patient was tolerating his treatment well except for fatigue but after cycle #3 he has significant decline in his condition and was admitted to the hospital with diabetic ketoacidosis as well as questionable sepsis.  He stayed in the hospital for almost 2 weeks and treated with aggressive antibiotics as well as hydration and  management of his diabetes. He continues to have increasing fatigue and weakness after discharge with persistent diarrhea.  We will check C. difficile on the patient. For the dehydration and electrolyte imbalance, we will give the patient a liter of normal saline in addition to 4 g of magnesium sulfate and 20 mEq of potassium chloride intravenously in the clinic today. The patient will continue his current treatment with Lomotil and Imodium but if the C. difficile is positive we will discontinue this medication and treat him for C. difficile infection. The patient was also advised to go immediately to the emergency department if he has no improvement in his condition in the next several days. I will see him back for follow-up visit in 2 weeks with repeat blood work. He was advised to call immediately if he has any concerning symptoms in the interval. The patient voices understanding of current disease status and treatment options and is in agreement with the current care plan. All questions were answered. The patient knows to call the clinic with any problems, questions or concerns. We can certainly see the patient much sooner if necessary.  Disclaimer:  This note was dictated with voice recognition software. Similar sounding words can inadvertently be transcribed and may not be corrected upon review.

## 2020-06-27 NOTE — Progress Notes (Signed)
Please call to let patient know that the urinalysis and spine x-ray results are normal and continue follow-up with oncologist

## 2020-06-27 NOTE — Progress Notes (Signed)
Nutrition Follow-up:  Patient with CLL followed by Dr Earlie Server.  Noted recent hospitalization for diabetic ketoacidosis and sepsis.    Met with patient in infusion (IV fluids with Mag and K).  Noted checking c-diff today.  Patient reports no appetite.  Reports that he is trying to drink fluids.  Multiple diarrhea stools daily (10 times).    Medications: lomotil and imodium  Labs: K 3.2, Mag 1.3  Anthropometrics:   Patient unable to weigh today   NUTRITION DIAGNOSIS: Unintentional weight loss continues   INTERVENTION:  Reviewed strategies to help with diarrhea.  Factsheet given to patient.   Encouraged small frequent meals/snacks Discussed increased fluids with diarrhea.    MONITORING, EVALUATION, GOAL: weight trends, intake   NEXT VISIT: to be determined  Grayson Pfefferle B. Zenia Resides, [place], [place] Registered Dietitian [phone] (pager)

## 2020-06-28 ENCOUNTER — Other Ambulatory Visit: Payer: Self-pay | Admitting: Endocrinology

## 2020-06-28 ENCOUNTER — Encounter (HOSPITAL_COMMUNITY): Payer: Self-pay

## 2020-06-28 ENCOUNTER — Inpatient Hospital Stay: Payer: Medicare HMO

## 2020-06-28 ENCOUNTER — Telehealth: Payer: Medicare HMO | Admitting: Internal Medicine

## 2020-06-28 ENCOUNTER — Emergency Department (HOSPITAL_COMMUNITY): Payer: Medicare HMO

## 2020-06-28 ENCOUNTER — Telehealth: Payer: Self-pay | Admitting: Medical Oncology

## 2020-06-28 ENCOUNTER — Inpatient Hospital Stay (HOSPITAL_COMMUNITY)
Admission: EM | Admit: 2020-06-28 | Discharge: 2020-07-11 | DRG: 393 | Disposition: A | Discharge status: Home / Self Care | Payer: Medicare HMO | Attending: Family Medicine | Admitting: Family Medicine

## 2020-06-28 DIAGNOSIS — R58 Hemorrhage, not elsewhere classified: Secondary | ICD-10-CM | POA: Diagnosis not present

## 2020-06-28 DIAGNOSIS — I110 Hypertensive heart disease with heart failure: Secondary | ICD-10-CM | POA: Diagnosis present

## 2020-06-28 DIAGNOSIS — I5042 Chronic combined systolic (congestive) and diastolic (congestive) heart failure: Secondary | ICD-10-CM | POA: Diagnosis not present

## 2020-06-28 DIAGNOSIS — I10 Essential (primary) hypertension: Secondary | ICD-10-CM | POA: Diagnosis not present

## 2020-06-28 DIAGNOSIS — K5641 Fecal impaction: Secondary | ICD-10-CM | POA: Diagnosis not present

## 2020-06-28 DIAGNOSIS — E1110 Type 2 diabetes mellitus with ketoacidosis without coma: Secondary | ICD-10-CM | POA: Diagnosis present

## 2020-06-28 DIAGNOSIS — Z681 Body mass index (BMI) 19 or less, adult: Secondary | ICD-10-CM | POA: Diagnosis not present

## 2020-06-28 DIAGNOSIS — E8729 Other acidosis: Secondary | ICD-10-CM | POA: Diagnosis present

## 2020-06-28 DIAGNOSIS — R159 Full incontinence of feces: Secondary | ICD-10-CM | POA: Diagnosis not present

## 2020-06-28 DIAGNOSIS — G4730 Sleep apnea, unspecified: Secondary | ICD-10-CM | POA: Diagnosis present

## 2020-06-28 DIAGNOSIS — K921 Melena: Secondary | ICD-10-CM

## 2020-06-28 DIAGNOSIS — R627 Adult failure to thrive: Secondary | ICD-10-CM | POA: Diagnosis not present

## 2020-06-28 DIAGNOSIS — C9110 Chronic lymphocytic leukemia of B-cell type not having achieved remission: Secondary | ICD-10-CM | POA: Diagnosis not present

## 2020-06-28 DIAGNOSIS — I480 Paroxysmal atrial fibrillation: Secondary | ICD-10-CM | POA: Diagnosis present

## 2020-06-28 DIAGNOSIS — R05 Cough: Secondary | ICD-10-CM | POA: Diagnosis not present

## 2020-06-28 DIAGNOSIS — R9431 Abnormal electrocardiogram [ECG] [EKG]: Secondary | ICD-10-CM

## 2020-06-28 DIAGNOSIS — R531 Weakness: Secondary | ICD-10-CM | POA: Diagnosis present

## 2020-06-28 DIAGNOSIS — I700 Atherosclerosis of aorta: Secondary | ICD-10-CM | POA: Diagnosis present

## 2020-06-28 DIAGNOSIS — K219 Gastro-esophageal reflux disease without esophagitis: Secondary | ICD-10-CM | POA: Diagnosis present

## 2020-06-28 DIAGNOSIS — I82621 Acute embolism and thrombosis of deep veins of right upper extremity: Secondary | ICD-10-CM | POA: Diagnosis present

## 2020-06-28 DIAGNOSIS — R059 Cough, unspecified: Secondary | ICD-10-CM

## 2020-06-28 DIAGNOSIS — R609 Edema, unspecified: Secondary | ICD-10-CM

## 2020-06-28 DIAGNOSIS — K648 Other hemorrhoids: Secondary | ICD-10-CM | POA: Diagnosis present

## 2020-06-28 DIAGNOSIS — K5730 Diverticulosis of large intestine without perforation or abscess without bleeding: Secondary | ICD-10-CM | POA: Diagnosis present

## 2020-06-28 DIAGNOSIS — J90 Pleural effusion, not elsewhere classified: Secondary | ICD-10-CM | POA: Diagnosis not present

## 2020-06-28 DIAGNOSIS — K626 Ulcer of anus and rectum: Secondary | ICD-10-CM

## 2020-06-28 DIAGNOSIS — I420 Dilated cardiomyopathy: Secondary | ICD-10-CM | POA: Diagnosis not present

## 2020-06-28 DIAGNOSIS — K633 Ulcer of intestine: Principal | ICD-10-CM | POA: Diagnosis present

## 2020-06-28 DIAGNOSIS — F0281 Dementia in other diseases classified elsewhere with behavioral disturbance: Secondary | ICD-10-CM | POA: Diagnosis not present

## 2020-06-28 DIAGNOSIS — Z95828 Presence of other vascular implants and grafts: Secondary | ICD-10-CM

## 2020-06-28 DIAGNOSIS — R570 Cardiogenic shock: Secondary | ICD-10-CM | POA: Diagnosis not present

## 2020-06-28 DIAGNOSIS — J9690 Respiratory failure, unspecified, unspecified whether with hypoxia or hypercapnia: Secondary | ICD-10-CM | POA: Diagnosis not present

## 2020-06-28 DIAGNOSIS — K529 Noninfective gastroenteritis and colitis, unspecified: Secondary | ICD-10-CM | POA: Diagnosis present

## 2020-06-28 DIAGNOSIS — Z794 Long term (current) use of insulin: Secondary | ICD-10-CM

## 2020-06-28 DIAGNOSIS — R579 Shock, unspecified: Secondary | ICD-10-CM | POA: Diagnosis present

## 2020-06-28 DIAGNOSIS — D649 Anemia, unspecified: Secondary | ICD-10-CM | POA: Diagnosis present

## 2020-06-28 DIAGNOSIS — E1165 Type 2 diabetes mellitus with hyperglycemia: Secondary | ICD-10-CM | POA: Diagnosis not present

## 2020-06-28 DIAGNOSIS — E43 Unspecified severe protein-calorie malnutrition: Secondary | ICD-10-CM | POA: Diagnosis present

## 2020-06-28 DIAGNOSIS — I2510 Atherosclerotic heart disease of native coronary artery without angina pectoris: Secondary | ICD-10-CM | POA: Diagnosis present

## 2020-06-28 DIAGNOSIS — E785 Hyperlipidemia, unspecified: Secondary | ICD-10-CM | POA: Diagnosis present

## 2020-06-28 DIAGNOSIS — Z8249 Family history of ischemic heart disease and other diseases of the circulatory system: Secondary | ICD-10-CM

## 2020-06-28 DIAGNOSIS — J150 Pneumonia due to Klebsiella pneumoniae: Secondary | ICD-10-CM | POA: Diagnosis not present

## 2020-06-28 DIAGNOSIS — I4891 Unspecified atrial fibrillation: Secondary | ICD-10-CM

## 2020-06-28 DIAGNOSIS — K2970 Gastritis, unspecified, without bleeding: Secondary | ICD-10-CM | POA: Diagnosis present

## 2020-06-28 DIAGNOSIS — Z79891 Long term (current) use of opiate analgesic: Secondary | ICD-10-CM

## 2020-06-28 DIAGNOSIS — K449 Diaphragmatic hernia without obstruction or gangrene: Secondary | ICD-10-CM | POA: Diagnosis present

## 2020-06-28 DIAGNOSIS — J189 Pneumonia, unspecified organism: Secondary | ICD-10-CM | POA: Diagnosis not present

## 2020-06-28 DIAGNOSIS — K625 Hemorrhage of anus and rectum: Secondary | ICD-10-CM | POA: Diagnosis not present

## 2020-06-28 DIAGNOSIS — Z841 Family history of disorders of kidney and ureter: Secondary | ICD-10-CM

## 2020-06-28 DIAGNOSIS — K222 Esophageal obstruction: Secondary | ICD-10-CM | POA: Diagnosis present

## 2020-06-28 DIAGNOSIS — T451X5A Adverse effect of antineoplastic and immunosuppressive drugs, initial encounter: Secondary | ICD-10-CM | POA: Diagnosis present

## 2020-06-28 DIAGNOSIS — J9600 Acute respiratory failure, unspecified whether with hypoxia or hypercapnia: Secondary | ICD-10-CM

## 2020-06-28 DIAGNOSIS — L03115 Cellulitis of right lower limb: Secondary | ICD-10-CM | POA: Diagnosis present

## 2020-06-28 DIAGNOSIS — D61810 Antineoplastic chemotherapy induced pancytopenia: Secondary | ICD-10-CM | POA: Diagnosis not present

## 2020-06-28 DIAGNOSIS — D62 Acute posthemorrhagic anemia: Secondary | ICD-10-CM | POA: Diagnosis not present

## 2020-06-28 DIAGNOSIS — G9341 Metabolic encephalopathy: Secondary | ICD-10-CM | POA: Diagnosis present

## 2020-06-28 DIAGNOSIS — K5900 Constipation, unspecified: Secondary | ICD-10-CM | POA: Diagnosis not present

## 2020-06-28 DIAGNOSIS — I429 Cardiomyopathy, unspecified: Secondary | ICD-10-CM | POA: Diagnosis not present

## 2020-06-28 DIAGNOSIS — I5041 Acute combined systolic (congestive) and diastolic (congestive) heart failure: Secondary | ICD-10-CM | POA: Diagnosis not present

## 2020-06-28 DIAGNOSIS — Z789 Other specified health status: Secondary | ICD-10-CM | POA: Diagnosis not present

## 2020-06-28 DIAGNOSIS — I82B11 Acute embolism and thrombosis of right subclavian vein: Secondary | ICD-10-CM | POA: Diagnosis present

## 2020-06-28 DIAGNOSIS — Z20822 Contact with and (suspected) exposure to covid-19: Secondary | ICD-10-CM | POA: Diagnosis not present

## 2020-06-28 DIAGNOSIS — Z8719 Personal history of other diseases of the digestive system: Secondary | ICD-10-CM | POA: Diagnosis not present

## 2020-06-28 DIAGNOSIS — Z7982 Long term (current) use of aspirin: Secondary | ICD-10-CM

## 2020-06-28 DIAGNOSIS — L89899 Pressure ulcer of other site, unspecified stage: Secondary | ICD-10-CM | POA: Diagnosis present

## 2020-06-28 DIAGNOSIS — K644 Residual hemorrhoidal skin tags: Secondary | ICD-10-CM | POA: Diagnosis present

## 2020-06-28 DIAGNOSIS — R197 Diarrhea, unspecified: Secondary | ICD-10-CM

## 2020-06-28 DIAGNOSIS — R2681 Unsteadiness on feet: Secondary | ICD-10-CM | POA: Diagnosis not present

## 2020-06-28 DIAGNOSIS — R443 Hallucinations, unspecified: Secondary | ICD-10-CM | POA: Diagnosis present

## 2020-06-28 DIAGNOSIS — Z720 Tobacco use: Secondary | ICD-10-CM

## 2020-06-28 DIAGNOSIS — K649 Unspecified hemorrhoids: Secondary | ICD-10-CM | POA: Diagnosis not present

## 2020-06-28 DIAGNOSIS — E860 Dehydration: Secondary | ICD-10-CM | POA: Diagnosis present

## 2020-06-28 DIAGNOSIS — G4733 Obstructive sleep apnea (adult) (pediatric): Secondary | ICD-10-CM | POA: Diagnosis present

## 2020-06-28 DIAGNOSIS — E872 Acidosis: Secondary | ICD-10-CM | POA: Diagnosis not present

## 2020-06-28 DIAGNOSIS — R5381 Other malaise: Secondary | ICD-10-CM

## 2020-06-28 DIAGNOSIS — E7800 Pure hypercholesterolemia, unspecified: Secondary | ICD-10-CM | POA: Diagnosis not present

## 2020-06-28 DIAGNOSIS — G301 Alzheimer's disease with late onset: Secondary | ICD-10-CM | POA: Diagnosis not present

## 2020-06-28 DIAGNOSIS — R2689 Other abnormalities of gait and mobility: Secondary | ICD-10-CM | POA: Diagnosis not present

## 2020-06-28 DIAGNOSIS — Z888 Allergy status to other drugs, medicaments and biological substances status: Secondary | ICD-10-CM

## 2020-06-28 DIAGNOSIS — E1010 Type 1 diabetes mellitus with ketoacidosis without coma: Secondary | ICD-10-CM | POA: Diagnosis not present

## 2020-06-28 DIAGNOSIS — R410 Disorientation, unspecified: Secondary | ICD-10-CM | POA: Diagnosis not present

## 2020-06-28 DIAGNOSIS — Z79899 Other long term (current) drug therapy: Secondary | ICD-10-CM

## 2020-06-28 DIAGNOSIS — J9811 Atelectasis: Secondary | ICD-10-CM | POA: Diagnosis not present

## 2020-06-28 DIAGNOSIS — E876 Hypokalemia: Secondary | ICD-10-CM | POA: Diagnosis present

## 2020-06-28 DIAGNOSIS — K922 Gastrointestinal hemorrhage, unspecified: Secondary | ICD-10-CM

## 2020-06-28 DIAGNOSIS — M6281 Muscle weakness (generalized): Secondary | ICD-10-CM | POA: Diagnosis not present

## 2020-06-28 HISTORY — DX: Unspecified atrial fibrillation: I48.91

## 2020-06-28 LAB — CBC
HCT: 26.3 % — ABNORMAL LOW (ref 39.0–52.0)
HCT: 26.6 % — ABNORMAL LOW (ref 39.0–52.0)
Hemoglobin: 8.5 g/dL — ABNORMAL LOW (ref 13.0–17.0)
Hemoglobin: 8.6 g/dL — ABNORMAL LOW (ref 13.0–17.0)
MCH: 31 pg (ref 26.0–34.0)
MCH: 31.4 pg (ref 26.0–34.0)
MCHC: 32.3 g/dL (ref 30.0–36.0)
MCHC: 32.3 g/dL (ref 30.0–36.0)
MCV: 96 fL (ref 80.0–100.0)
MCV: 97.1 fL (ref 80.0–100.0)
Platelets: 194 10*3/uL (ref 150–400)
Platelets: 193 10*3/uL (ref 150–400)
RBC: 2.74 MIL/uL — ABNORMAL LOW (ref 4.22–5.81)
RBC: 2.74 MIL/uL — ABNORMAL LOW (ref 4.22–5.81)
RDW: 18.6 % — ABNORMAL HIGH (ref 11.5–15.5)
RDW: 18.6 % — ABNORMAL HIGH (ref 11.5–15.5)
WBC: 5.3 10*3/uL (ref 4.0–10.5)
WBC: 5.9 10*3/uL (ref 4.0–10.5)
nRBC: 0 % (ref 0.0–0.2)
nRBC: 0 % (ref 0.0–0.2)

## 2020-06-28 LAB — COMPREHENSIVE METABOLIC PANEL
ALT: 10 U/L (ref 0–44)
AST: 13 U/L — ABNORMAL LOW (ref 15–41)
Albumin: 2.2 g/dL — ABNORMAL LOW (ref 3.5–5.0)
Alkaline Phosphatase: 63 U/L (ref 38–126)
Anion gap: 19 — ABNORMAL HIGH (ref 5–15)
BUN: 12 mg/dL (ref 8–23)
CO2: 18 mmol/L — ABNORMAL LOW (ref 22–32)
Calcium: 8.2 mg/dL — ABNORMAL LOW (ref 8.9–10.3)
Chloride: 100 mmol/L (ref 98–111)
Creatinine, Ser: 0.7 mg/dL (ref 0.61–1.24)
GFR calc Af Amer: >60 mL/min (ref >60)
GFR calc non Af Amer: >60 mL/min (ref >60)
Glucose, Bld: 252 mg/dL — ABNORMAL HIGH (ref 70–99)
Potassium: 3.2 mmol/L — ABNORMAL LOW (ref 3.5–5.1)
Sodium: 137 mmol/L (ref 135–145)
Total Bilirubin: 1.6 mg/dL — ABNORMAL HIGH (ref 0.3–1.2)
Total Protein: 4.3 g/dL — ABNORMAL LOW (ref 6.5–8.1)

## 2020-06-28 LAB — HIV ANTIBODY (ROUTINE TESTING W REFLEX): HIV Screen 4th Generation wRfx: NONREACTIVE

## 2020-06-28 LAB — CBG MONITORING, ED
Glucose-Capillary: 252 mg/dL — ABNORMAL HIGH (ref 70–99)
Glucose-Capillary: 192 mg/dL — ABNORMAL HIGH (ref 70–99)
Glucose-Capillary: 151 mg/dL — ABNORMAL HIGH (ref 70–99)
Glucose-Capillary: 117 mg/dL — ABNORMAL HIGH (ref 70–99)
Glucose-Capillary: 105 mg/dL — ABNORMAL HIGH (ref 70–99)
Glucose-Capillary: 98 mg/dL (ref 70–99)
Glucose-Capillary: 93 mg/dL (ref 70–99)
Glucose-Capillary: 127 mg/dL — ABNORMAL HIGH (ref 70–99)
Glucose-Capillary: 141 mg/dL — ABNORMAL HIGH (ref 70–99)
Glucose-Capillary: 137 mg/dL — ABNORMAL HIGH (ref 70–99)

## 2020-06-28 LAB — BASIC METABOLIC PANEL
Anion gap: 12 (ref 5–15)
Anion gap: 14 (ref 5–15)
BUN: 11 mg/dL (ref 8–23)
BUN: 11 mg/dL (ref 8–23)
CO2: 23 mmol/L (ref 22–32)
CO2: 20 mmol/L — ABNORMAL LOW (ref 22–32)
Calcium: 7.9 mg/dL — ABNORMAL LOW (ref 8.9–10.3)
Calcium: 8 mg/dL — ABNORMAL LOW (ref 8.9–10.3)
Chloride: 105 mmol/L (ref 98–111)
Chloride: 104 mmol/L (ref 98–111)
Creatinine, Ser: 0.55 mg/dL — ABNORMAL LOW (ref 0.61–1.24)
Creatinine, Ser: 0.6 mg/dL — ABNORMAL LOW (ref 0.61–1.24)
GFR calc Af Amer: >60 mL/min (ref >60)
GFR calc Af Amer: >60 mL/min (ref >60)
GFR calc non Af Amer: >60 mL/min (ref >60)
GFR calc non Af Amer: >60 mL/min (ref >60)
Glucose, Bld: 83 mg/dL (ref 70–99)
Glucose, Bld: 109 mg/dL — ABNORMAL HIGH (ref 70–99)
Potassium: 2.5 mmol/L — CL (ref 3.5–5.1)
Potassium: 3.3 mmol/L — ABNORMAL LOW (ref 3.5–5.1)
Sodium: 140 mmol/L (ref 135–145)
Sodium: 138 mmol/L (ref 135–145)

## 2020-06-28 LAB — BLOOD GAS, VENOUS
Acid-base deficit: 2.7 mmol/L — ABNORMAL HIGH (ref 0.0–2.0)
Bicarbonate: 21.7 mmol/L (ref 20.0–28.0)
O2 Saturation: 50 %
Patient temperature: 98.6
pCO2, Ven: 38.4 mmHg — ABNORMAL LOW (ref 44.0–60.0)
pH, Ven: 7.37 (ref 7.250–7.430)
pO2, Ven: 31.9 mmHg — CL (ref 32.0–45.0)

## 2020-06-28 LAB — URINALYSIS, ROUTINE W REFLEX MICROSCOPIC
Bilirubin Urine: NEGATIVE
Glucose, UA: >=500 mg/dL — AB
Hgb urine dipstick: NEGATIVE
Ketones, ur: 80 mg/dL — AB
Leukocytes,Ua: NEGATIVE
Nitrite: NEGATIVE
Protein, ur: NEGATIVE mg/dL
Specific Gravity, Urine: 1.018 (ref 1.005–1.030)
pH: 5 (ref 5.0–8.0)

## 2020-06-28 LAB — BETA-HYDROXYBUTYRIC ACID: Beta-Hydroxybutyric Acid: 2.6 mmol/L — ABNORMAL HIGH (ref 0.05–0.27)

## 2020-06-28 LAB — MRSA PCR SCREENING: MRSA by PCR: NEGATIVE

## 2020-06-28 LAB — CBC WITH DIFFERENTIAL/PLATELET
Abs Immature Granulocytes: 0.15 10*3/uL — ABNORMAL HIGH (ref 0.00–0.07)
Basophils Absolute: 0 10*3/uL (ref 0.0–0.1)
Basophils Relative: 0 %
Eosinophils Absolute: 0 10*3/uL (ref 0.0–0.5)
Eosinophils Relative: 0 %
HCT: 26.4 % — ABNORMAL LOW (ref 39.0–52.0)
Hemoglobin: 8.6 g/dL — ABNORMAL LOW (ref 13.0–17.0)
Immature Granulocytes: 3 %
Lymphocytes Relative: 21 %
Lymphs Abs: 1.2 10*3/uL (ref 0.7–4.0)
MCH: 31.7 pg (ref 26.0–34.0)
MCHC: 32.6 g/dL (ref 30.0–36.0)
MCV: 97.4 fL (ref 80.0–100.0)
Monocytes Absolute: 0.5 10*3/uL (ref 0.1–1.0)
Monocytes Relative: 8 %
Neutro Abs: 3.8 10*3/uL (ref 1.7–7.7)
Neutrophils Relative %: 68 %
Platelets: 178 10*3/uL (ref 150–400)
RBC: 2.71 MIL/uL — ABNORMAL LOW (ref 4.22–5.81)
RDW: 18.5 % — ABNORMAL HIGH (ref 11.5–15.5)
WBC: 5.6 10*3/uL (ref 4.0–10.5)
nRBC: 0 % (ref 0.0–0.2)

## 2020-06-28 LAB — PROTIME-INR
INR: 1.1 (ref 0.8–1.2)
Prothrombin Time: 14.2 seconds (ref 11.4–15.2)

## 2020-06-28 LAB — GLUCOSE, CAPILLARY: Glucose-Capillary: 94 mg/dL (ref 70–99)

## 2020-06-28 LAB — SARS CORONAVIRUS 2 BY RT PCR (HOSPITAL ORDER, PERFORMED IN ~~LOC~~ HOSPITAL LAB): SARS Coronavirus 2: NEGATIVE

## 2020-06-28 LAB — POC OCCULT BLOOD, ED: Fecal Occult Bld: POSITIVE — AB

## 2020-06-28 MED ORDER — SODIUM CHLORIDE 0.9 % IV BOLUS
1000.0000 mL | Freq: Once | INTRAVENOUS | Status: AC
  Administered 2020-06-28: 1000 mL via INTRAVENOUS

## 2020-06-28 MED ORDER — SODIUM CHLORIDE 0.9 % IV BOLUS: 20.0000 mL/kg | Freq: Once | INTRAVENOUS | Status: DC

## 2020-06-28 MED ORDER — ORAL CARE MOUTH RINSE
15.0000 mL | Freq: Two times a day (BID) | OROMUCOSAL | Status: DC
  Administered 2020-06-29 – 2020-07-11 (×16): 15 mL via OROMUCOSAL

## 2020-06-28 MED ORDER — INSULIN GLARGINE 100 UNIT/ML ~~LOC~~ SOLN
5.0000 [IU] | Freq: Every day | SUBCUTANEOUS | Status: DC
  Administered 2020-06-28: 5 [IU] via SUBCUTANEOUS
  Filled 2020-06-28: qty 0.05

## 2020-06-28 MED ORDER — ACETAMINOPHEN 650 MG RE SUPP: 650.0000 mg | Freq: Four times a day (QID) | RECTAL | Status: DC | PRN

## 2020-06-28 MED ORDER — INSULIN ASPART 100 UNIT/ML ~~LOC~~ SOLN
0.0000 [IU] | Freq: Three times a day (TID) | SUBCUTANEOUS | Status: DC
  Administered 2020-06-28: 1 [IU] via SUBCUTANEOUS
  Administered 2020-06-29: 2 [IU] via SUBCUTANEOUS
  Administered 2020-06-29 – 2020-06-30 (×2): 1 [IU] via SUBCUTANEOUS
  Administered 2020-06-30: 5 [IU] via SUBCUTANEOUS
  Administered 2020-07-01: 3 [IU] via SUBCUTANEOUS
  Administered 2020-07-02 (×3): 2 [IU] via SUBCUTANEOUS
  Administered 2020-07-03 – 2020-07-04 (×4): 1 [IU] via SUBCUTANEOUS
  Administered 2020-07-04 (×2): 3 [IU] via SUBCUTANEOUS
  Administered 2020-07-05: 2 [IU] via SUBCUTANEOUS
  Administered 2020-07-05: 3 [IU] via SUBCUTANEOUS
  Administered 2020-07-05 – 2020-07-06 (×2): 1 [IU] via SUBCUTANEOUS
  Administered 2020-07-06: 5 [IU] via SUBCUTANEOUS
  Administered 2020-07-07 (×2): 3 [IU] via SUBCUTANEOUS
  Administered 2020-07-08: 5 [IU] via SUBCUTANEOUS
  Administered 2020-07-08: 3 [IU] via SUBCUTANEOUS
  Administered 2020-07-09: 1 [IU] via SUBCUTANEOUS
  Administered 2020-07-09: 2 [IU] via SUBCUTANEOUS
  Administered 2020-07-10: 3 [IU] via SUBCUTANEOUS
  Administered 2020-07-10: 2 [IU] via SUBCUTANEOUS
  Administered 2020-07-11: 1 [IU] via SUBCUTANEOUS
  Filled 2020-06-28: qty 0.09

## 2020-06-28 MED ORDER — CHLORHEXIDINE GLUCONATE 0.12 % MT SOLN
15.0000 mL | Freq: Two times a day (BID) | OROMUCOSAL | Status: DC
  Administered 2020-06-28 – 2020-07-11 (×24): 15 mL via OROMUCOSAL
  Filled 2020-06-28 (×22): qty 15

## 2020-06-28 MED ORDER — POLYETHYLENE GLYCOL 3350 17 G PO PACK
17.0000 g | PACK | Freq: Two times a day (BID) | ORAL | Status: DC
  Administered 2020-06-28 – 2020-07-04 (×13): 17 g via ORAL
  Filled 2020-06-28 (×14): qty 1

## 2020-06-28 MED ORDER — POTASSIUM CHLORIDE 10 MEQ/100ML IV SOLN
10.0000 meq | INTRAVENOUS | Status: AC
  Administered 2020-06-28 (×6): 10 meq via INTRAVENOUS
  Filled 2020-06-28 (×6): qty 100

## 2020-06-28 MED ORDER — POTASSIUM CHLORIDE 10 MEQ/100ML IV SOLN
10.0000 meq | INTRAVENOUS | Status: DC
  Administered 2020-06-28 (×3): 10 meq via INTRAVENOUS
  Filled 2020-06-28 (×2): qty 100

## 2020-06-28 MED ORDER — ROSUVASTATIN CALCIUM 5 MG PO TABS
5.0000 mg | ORAL_TABLET | Freq: Every day | ORAL | Status: DC
  Administered 2020-06-28 – 2020-07-11 (×14): 5 mg via ORAL
  Filled 2020-06-28 (×15): qty 1

## 2020-06-28 MED ORDER — ACETAMINOPHEN 325 MG PO TABS
650.0000 mg | ORAL_TABLET | Freq: Four times a day (QID) | ORAL | Status: DC | PRN
  Administered 2020-07-01 – 2020-07-07 (×4): 650 mg via ORAL
  Filled 2020-06-28 (×4): qty 2

## 2020-06-28 MED ORDER — ENOXAPARIN SODIUM 40 MG/0.4ML ~~LOC~~ SOLN
40.0000 mg | Freq: Every day | SUBCUTANEOUS | Status: DC
  Administered 2020-06-28: 40 mg via SUBCUTANEOUS
  Filled 2020-06-28: qty 0.4

## 2020-06-28 MED ORDER — MAGNESIUM SULFATE 4 GM/100ML IV SOLN
4.0000 g | Freq: Once | INTRAVENOUS | Status: AC
  Administered 2020-06-28: 4 g via INTRAVENOUS
  Filled 2020-06-28: qty 100

## 2020-06-28 MED ORDER — DEXTROSE-NACL 5-0.45 % IV SOLN: INTRAVENOUS | Status: DC

## 2020-06-28 MED ORDER — INSULIN REGULAR(HUMAN) IN NACL 100-0.9 UT/100ML-% IV SOLN
INTRAVENOUS | Status: DC
  Administered 2020-06-28: 10 [IU]/h via INTRAVENOUS
  Filled 2020-06-28: qty 100

## 2020-06-28 MED ORDER — INSULIN ASPART 100 UNIT/ML ~~LOC~~ SOLN
0.0000 [IU] | Freq: Every day | SUBCUTANEOUS | Status: DC
  Administered 2020-06-30 – 2020-07-09 (×4): 2 [IU] via SUBCUTANEOUS
  Filled 2020-06-28: qty 0.05

## 2020-06-28 MED ORDER — BISACODYL 10 MG RE SUPP
10.0000 mg | Freq: Every day | RECTAL | Status: DC
  Administered 2020-06-29 – 2020-07-04 (×6): 10 mg via RECTAL
  Filled 2020-06-28 (×7): qty 1

## 2020-06-28 MED ORDER — SODIUM CHLORIDE 0.9 % IV SOLN: INTRAVENOUS | Status: DC

## 2020-06-28 MED ORDER — CHLORHEXIDINE GLUCONATE CLOTH 2 % EX PADS
6.0000 | MEDICATED_PAD | Freq: Every day | CUTANEOUS | Status: DC
  Administered 2020-06-28 – 2020-07-06 (×8): 6 via TOPICAL

## 2020-06-28 MED ORDER — BISACODYL 5 MG PO TBEC
5.0000 mg | DELAYED_RELEASE_TABLET | Freq: Once | ORAL | Status: AC
  Administered 2020-06-28: 5 mg via ORAL
  Filled 2020-06-28: qty 1

## 2020-06-28 MED ORDER — SODIUM CHLORIDE 0.9 % IV BOLUS
20.0000 mL/kg | Freq: Once | INTRAVENOUS | Status: AC
  Administered 2020-06-28: 1360 mL via INTRAVENOUS

## 2020-06-28 MED ORDER — SACUBITRIL-VALSARTAN 24-26 MG PO TABS
1.0000 | ORAL_TABLET | Freq: Two times a day (BID) | ORAL | Status: DC
  Administered 2020-06-28 – 2020-06-30 (×4): 1 via ORAL
  Filled 2020-06-28 (×4): qty 1

## 2020-06-28 MED ORDER — CARVEDILOL 12.5 MG PO TABS
12.5000 mg | ORAL_TABLET | Freq: Two times a day (BID) | ORAL | Status: DC
  Administered 2020-06-28: 12.5 mg via ORAL
  Filled 2020-06-28: qty 1

## 2020-06-28 MED ORDER — DEXTROSE 50 % IV SOLN: 0.0000 mL | INTRAVENOUS | Status: DC | PRN

## 2020-06-28 MED ORDER — POTASSIUM CHLORIDE 10 MEQ/100ML IV SOLN
10.0000 meq | INTRAVENOUS | Status: DC
  Administered 2020-06-28: 10 meq via INTRAVENOUS
  Filled 2020-06-28: qty 100

## 2020-06-28 NOTE — ED Notes (Signed)
Pt has been cleaned and transferred to hospital bed for comfort.

## 2020-06-28 NOTE — H&P (Addendum)
History and Physical    Tony Marshall XFG:[id number] DOB: Apr 21, 1941 DOA: 06/28/2020  PCP: Elayne Snare, MD  Patient coming from: Home  Chief Complaint: Diarrhea  HPI: Tony Marshall is a 79 y.o. male with medical history significant of CLL, DM, HTN, HLD, combined systolic/diastolic CHF who presents to the ed with complaints of diarrhea which started suddenly on 7/13. Noted to be maroon in color with blood clots. Pt's wife arranged outpt visit with primary GI provider, Dr Hilarie Fredrickson, for 7/15, however, symptoms worsened and pt subsequently was brought to ED for further work up. Patient was recently discharged on 7/6 for treatment of septic shock from LE cellulitis. Pt recently completed course of keflex.   ED Course: In the ED, pt was found to have glucose of 252 with an anion gap of 19. Pt was started on IVF boluses with basal IVF hydration with insulin gtt. Pt was noted to be heme pos with hgb of 8.6. GI consulted by EDP. Hospitalist consulted for consideration for admission  Review of Systems:  Review of Systems  Constitutional: Positive for malaise/fatigue. Negative for fever.  HENT: Negative for congestion, ear discharge, ear pain and tinnitus.   Eyes: Negative for double vision, photophobia and discharge.  Respiratory: Negative for hemoptysis and shortness of breath.   Cardiovascular: Negative for chest pain, palpitations, orthopnea and leg swelling.  Gastrointestinal: Positive for blood in stool and diarrhea. Negative for abdominal pain.  Genitourinary: Negative for frequency, hematuria and urgency.  Musculoskeletal: Negative for back pain, joint pain and neck pain.  Neurological: Negative for tingling, tremors, seizures, loss of consciousness and headaches.  Psychiatric/Behavioral: Negative for hallucinations and memory loss. The patient is not nervous/anxious.     Past Medical History:  Diagnosis Date  . CLL (chronic lymphocytic leukemia) ([place]) dx'd 2014   progression 12/2019    . Diabetes mellitus without complication ([place])   . Diverticulosis   . External hemorrhoids   . GERD (gastroesophageal reflux disease)   . Hiatal hernia   . History of colon polyps   . Hyperlipidemia   . Hypertension   . Internal hemorrhoids   . Lymphocytosis   . Schatzki's ring   . Sleep apnea     Past Surgical History:  Procedure Laterality Date  . BLEPHAROPLASTY    . COLONOSCOPY  2012  . HEMORRHOID SURGERY     Sclerotherapy  . KNEE SURGERY Right 2002  . POLYPECTOMY    . SHOULDER SURGERY Right 2007     reports that he has never smoked. He quit smokeless tobacco use about 7 years ago.  His smokeless tobacco use included chew. He reports current alcohol use of about 6.0 standard drinks of alcohol per week. He reports that he does not use drugs.  Allergies  Allergen Reactions  . Glycopyrrolate Rash    Family History  Problem Relation Age of Onset  . Kidney disease Mother   . CAD Maternal Aunt   . Colon cancer Neg Hx   . Esophageal cancer Neg Hx   . Rectal cancer Neg Hx   . Stomach cancer Neg Hx     Prior to Admission medications   Medication Sig Start Date End Date Taking? Authorizing Provider  acetaminophen (TYLENOL) 500 MG tablet Take 500 mg by mouth every 6 (six) hours as needed for moderate pain.    [provider]  allopurinol (ZYLOPRIM) 100 MG tablet Take 1 tablet (100 mg total) by mouth 2 (two) times daily. 03/28/20   Curt Bears, MD  ALPRAZolam (XANAX) 0.25 MG tablet Take 1 tablet (0.25 mg total) by mouth at bedtime as needed for anxiety. 06/21/20   Curt Bears, MD  aspirin 81 MG EC tablet Take 81 mg by mouth daily. Swallow whole.    [provider]  benazepril (LOTENSIN) 20 MG tablet Take 20 mg by mouth daily. 06/26/20   [provider]  carvedilol (COREG) 12.5 MG tablet Take 1 tablet (12.5 mg total) by mouth 2 (two) times daily with a meal. 06/20/20 07/20/20  Antonieta Pert, MD  diphenoxylate-atropine (LOMOTIL) 2.5-0.025 MG tablet 1  to 2 tablets PO QID prn diarrhea 06/23/20   Tanner, Lyndon Code., PA-C  dronabinol (MARINOL) 2.5 MG capsule Take 1 capsule (2.5 mg total) by mouth 2 (two) times daily before a meal. 05/30/20   Heilingoetter, Cassandra L, PA-C  esomeprazole (NEXIUM) 40 MG capsule TAKE 1 CAPSULE BY MOUTH EVERY MORNING 30 MINUTES BEFORE BREAKFAST Patient taking differently: Take 40 mg by mouth daily.  04/28/20   Pyrtle, Lajuan Lines, MD  feeding supplement, GLUCERNA SHAKE, (GLUCERNA SHAKE) LIQD Take 237 mLs by mouth 3 (three) times daily with meals. 06/20/20 07/20/20  Antonieta Pert, MD  folic acid (FOLVITE) 1 MG tablet Take 1 tablet (1 mg total) by mouth daily. 06/21/20 07/21/20  Antonieta Pert, MD  glucagon (GLUCAGON EMERGENCY) 1 MG injection Inject 1 mg into the skin once as needed (For severe low blood sugar). 06/14/19   Elayne Snare, MD  insulin aspart (NOVOLOG FLEXPEN) 100 UNIT/ML FlexPen Inject 3-4 Units into the skin 3 (three) times daily with meals. Inject 3-4 units under the skin three times daily before meals.     [provider]  insulin degludec (TRESIBA FLEXTOUCH) 100 UNIT/ML FlexTouch Pen inject 7 units daily. if fasting blood sugars are over 140 increase dosage up to 15 units daily as directed. 06/20/20   Antonieta Pert, MD  loperamide (IMODIUM A-D) 2 MG tablet Take 2 mg by mouth 4 (four) times daily as needed for diarrhea or loose stools.    [provider]  metFORMIN (GLUCOPHAGE-XR) 500 MG 24 hr tablet Take 1 tablet (500 mg total) by mouth daily with supper. Patient not taking: Reported on 06/26/2020 09/16/19   Elayne Snare, MD  metroNIDAZOLE (FLAGYL) 250 MG tablet Take 1 tablet (250 mg total) by mouth 3 (three) times daily. 06/26/20   Elayne Snare, MD  [facility] VERIO test strip USE 1 STRIP TO CHECK GLUCOSE THREE TIMES DAILY 02/24/20   Elayne Snare, MD  oxyCODONE (OXY IR/ROXICODONE) 5 MG immediate release tablet Take 1 tablet (5 mg total) by mouth every 4 (four) hours as needed for severe pain. 06/23/20   Tanner, Lyndon Code., PA-C    oxymetazoline (AFRIN) 0.05 % nasal spray Place 1 spray into both nostrils 2 (two) times daily as needed for congestion.    [provider]  pioglitazone (ACTOS) 15 MG tablet Take 15 mg by mouth daily. 06/26/20   [provider]  potassium chloride SA (KLOR-CON) 20 MEQ tablet Take 1 tablet (20 mEq total) by mouth 2 (two) times daily. 05/12/20   Tanner, Lyndon Code., PA-C  prochlorperazine (COMPAZINE) 10 MG tablet Take 1 tablet (10 mg total) by mouth every 6 (six) hours as needed for nausea or vomiting. 03/28/20   Curt Bears, MD  rosuvastatin (CRESTOR) 5 MG tablet TAKE 1 TABLET BY MOUTH EVERY DAY 04/21/20   Elayne Snare, MD  sacubitril-valsartan (ENTRESTO) 24-26 MG Take 1 tablet by mouth 2 (two) times daily. 06/20/20 07/20/20  Antonieta Pert,  MD  senna (SENOKOT) 8.6 MG TABS tablet Take 1 tablet (8.6 mg total) by mouth daily. Patient not taking: Reported on 06/27/2020 06/21/20   Antonieta Pert, MD  thiamine 100 MG tablet Take 1 tablet (100 mg total) by mouth daily. 06/21/20 07/21/20  Antonieta Pert, MD  traMADol (ULTRAM) 50 MG tablet Take 1 tablet (50 mg total) by mouth every 12 (twelve) hours as needed. 06/21/20   Curt Bears, MD    Physical Exam: Vitals:   06/28/20 0635 06/28/20 0936  BP: 121/69 (!) 145/78  Pulse: 83 84  Resp: 17 15  Temp: 98 F (36.7 C)   TempSrc: Oral   SpO2: 100% 99%  Weight: 68 kg   Height: 5\' 7"  (1.702 m)     Constitutional: NAD, calm, comfortable Vitals:   06/28/20 0635 06/28/20 0936  BP: 121/69 (!) 145/78  Pulse: 83 84  Resp: 17 15  Temp: 98 F (36.7 C)   TempSrc: Oral   SpO2: 100% 99%  Weight: 68 kg   Height: 5\' 7"  (1.702 m)    Eyes: PERRL, lids and conjunctivae normal ENMT: Mucous membranes are dry. Posterior pharynx clear of any exudate or lesions.Normal dentition.  Neck: normal, supple, no masses, no thyromegaly Respiratory: clear to auscultation bilaterally, no wheezing, no crackles. Normal respiratory effort. No accessory muscle use.  Cardiovascular:  Regular rate and rhythm, s1, s2 Abdomen: no tenderness, no masses palpated. No hepatosplenomegaly. Bowel sounds positive.  Musculoskeletal: no clubbing / cyanosis. No joint deformity upper and lower extremities. Good ROM, no contractures. Normal muscle tone.  Skin: no rashes, patch of erythema over RLE without drainage Neurologic: CN 2-12 grossly intact. Sensation intact, Strength 5/5 in all 4.  Psychiatric: Normal judgment and insight. Alert converses appropriately. At times confused   Labs on Admission: I have personally reviewed following labs and imaging studies  CBC: Recent Labs  Lab 06/23/20 1226 06/27/20 1010 06/28/20 0722  WBC 3.3* 5.8 5.6  NEUTROABS 2.5 3.9 3.8  HGB 8.0* 8.8* 8.6*  HCT 24.4* 26.2* 26.4*  MCV 95.3 92.3 97.4  PLT 110* 183 542   Basic Metabolic Panel: Recent Labs  Lab 06/23/20 1226 06/23/20 1343 06/27/20 1010 06/28/20 0722  NA 138  --  138 137  K 3.5  --  3.2* 3.2*  CL 100  --  103 100  CO2 24  --  22 18*  GLUCOSE 291*  --  123* 252*  BUN 12  --  12 12  CREATININE 0.71  --  0.74 0.70  CALCIUM 7.9*  --  8.3* 8.2*  MG  --  1.3* 1.3*  --    GFR: Estimated Creatinine Clearance: 73.4 mL/min (by C-G formula based on SCr of 0.7 mg/dL). Liver Function Tests: Recent Labs  Lab 06/23/20 1226 06/27/20 1010 06/28/20 0722  AST 13* 13* 13*  ALT 6 7 10   ALKPHOS 79 74 63  BILITOT 0.7 0.9 1.6*  PROT 4.5* 4.7* 4.3*  ALBUMIN 2.2* 2.3* 2.2*   No results for input(s): LIPASE, AMYLASE in the last 168 hours. No results for input(s): AMMONIA in the last 168 hours. Coagulation Profile: Recent Labs  Lab 06/28/20 0722  INR 1.1   Cardiac Enzymes: No results for input(s): CKTOTAL, CKMB, CKMBINDEX, TROPONINI in the last 168 hours. BNP (last 3 results) No results for input(s): PROBNP in the last 8760 hours. HbA1C: No results for input(s): HGBA1C in the last 72 hours. CBG: No results for input(s): GLUCAP in the last 168 hours. Lipid Profile: No results  for input(s): CHOL, HDL, LDLCALC, TRIG, CHOLHDL, LDLDIRECT in the last 72 hours. Thyroid Function Tests: No results for input(s): TSH, T4TOTAL, FREET4, T3FREE, THYROIDAB in the last 72 hours. Anemia Panel: No results for input(s): VITAMINB12, FOLATE, FERRITIN, TIBC, IRON, RETICCTPCT in the last 72 hours. Urine analysis:    Component Value Date/Time   COLORURINE YELLOW 06/28/2020 0758   APPEARANCEUR CLEAR 06/28/2020 0758   LABSPEC 1.018 06/28/2020 0758   PHURINE 5.0 06/28/2020 0758   GLUCOSEU >=500 (A) 06/28/2020 0758   GLUCOSEU 500 (A) 06/27/2020 1354   HGBUR NEGATIVE 06/28/2020 0758   BILIRUBINUR NEGATIVE 06/28/2020 0758   BILIRUBINUR negative 07/08/2018 1523   KETONESUR 80 (A) 06/28/2020 0758   PROTEINUR NEGATIVE 06/28/2020 0758   UROBILINOGEN 0.2 06/27/2020 1354   NITRITE NEGATIVE 06/28/2020 0758   LEUKOCYTESUR NEGATIVE 06/28/2020 0758   Sepsis Labs: !!!!!!!!!!!!!!!!!!!!!!!!!!!!!!!!!!!!!!!!!!!! @LABRCNTIP (procalcitonin:4,lacticidven:4) )No results found for this or any previous visit (from the past 240 hour(s)).   Radiological Exams on Admission: DG Lumbar Spine 2-3 Views  Result Date: 06/27/2020 CLINICAL DATA:  Low back pain.  No known injury. EXAM: LUMBAR SPINE - 2-3 VIEW COMPARISON:  CT chest, abdomen and pelvis 06/07/2020. FINDINGS: Vertebral body height and alignment are maintained. Intervertebral disc space height is normal. Paraspinous structures demonstrate atherosclerosis. IMPRESSION: Normal appearing lumbar spine. Aortic Atherosclerosis (ICD10-I70.0). Electronically Signed   By: Inge Rise M.D.   On: 06/27/2020 11:01   VAS [place] LOWER EXTREMITY VENOUS (DVT) (ONLY [facility] & [facility] 7a-7p)  Result Date: 06/28/2020  Lower Venous DVTStudy Indications: Edema.  Comparison       Piror studies RT LEV 06-11-2020 and 06-07-2020. Negative for Study:           DVT. Performing Technologist: Darlin Coco  Examination Guidelines: A complete evaluation includes B-mode imaging, spectral  Doppler, color Doppler, and power Doppler as needed of all accessible portions of each vessel. Bilateral testing is considered an integral part of a complete examination. Limited examinations for reoccurring indications may be performed as noted. The reflux portion of the exam is performed with the patient in reverse Trendelenburg.  +---------+---------------+---------+-----------+----------+--------------+ RIGHT    CompressibilityPhasicitySpontaneityPropertiesThrombus Aging +---------+---------------+---------+-----------+----------+--------------+ CFV      Full           Yes      Yes                                 +---------+---------------+---------+-----------+----------+--------------+ SFJ      Full                                                        +---------+---------------+---------+-----------+----------+--------------+ FV Prox  Full                                                        +---------+---------------+---------+-----------+----------+--------------+ FV Mid   Full                                                        +---------+---------------+---------+-----------+----------+--------------+  FV DistalFull                                                        +---------+---------------+---------+-----------+----------+--------------+ PFV      Full                                                        +---------+---------------+---------+-----------+----------+--------------+ POP      Full           Yes      Yes                                 +---------+---------------+---------+-----------+----------+--------------+ PTV      Full                                                        +---------+---------------+---------+-----------+----------+--------------+ PERO     Full                                                        +---------+---------------+---------+-----------+----------+--------------+    +----+---------------+---------+-----------+----------+--------------+ LEFTCompressibilityPhasicitySpontaneityPropertiesThrombus Aging +----+---------------+---------+-----------+----------+--------------+ CFV Full           Yes      Yes                                 +----+---------------+---------+-----------+----------+--------------+     Summary: RIGHT: - There is no evidence of deep vein thrombosis in the lower extremity.  - No cystic structure found in the popliteal fossa.  LEFT: - No evidence of common femoral vein obstruction.  *See table(s) above for measurements and observations.    Preliminary     EKG: Independently reviewed. Most recent from 6/22 reviewed, sinus on that study  Assessment/Plan Active Problems:   Essential hypertension   Pure hypercholesterolemia   CLL (chronic lymphocytic leukemia) ([facility])   Increased anion gap metabolic acidosis   Symptomatic anemia   Hyperglycemia due to diabetes mellitus ([facility])   DKA (diabetic ketoacidoses) ([facility])   Systolic and diastolic CHF, chronic ([place])   1. DKA with increased anion gap metabolic acidosis 1. Presenting with glucose in the 200's, most recent a1c of 7.7 noted 2. Anion gap elevated at 19 with >80 ketones in urine 3. Will continue IVF as tolerated, being cautious to avoid volume overload in setting of heart failure below 4. Continue insulin gtt 5. Follow serial BMP 6. Admit to SDU 2. Combined systolic/diastolic CHF 1. EF of 25-42% noted on recent echo 2. Seems to be euvolemic to mildly dehydrated at this time 3. Cautiously give IVF as per above 3. Acute blood loss anemia with likely lower GI bleed 1. Patient with reported large amounts of maroon blood with clots 2. Hgb currently stable  3. Last colonoscopy noted to be on 2017, reviewed. Diverticula with hemorrhoids noted on that exam 4. GI to be consulted by EDP 5. Will follow serial CBC trends 4. HTN 1. BP stable at present  2. Cont home regimen as  tolerated 5. CLL 1. Followed by oncology as outpatient 2. CBC trends appear stable, reviewed 3. Repeat CBC in AM 6. HLD 1. Cont home meds as tolerated 7. Recent cellulitis, resolved 1. Completed course of abx 2. Afebrile. No leukocytosis 8. Hypokalemia and hypomagesemia 1. Suspect secondary to recent decreased PO intake 2. Will correct 3. Follow lytes in AM  UPDATE: Discussed with GI after pt was evaluated by GI. Pt noted to be impacted with concerns of diarrhea secondary to constipation. GI recommendation for trial of enema  DVT prophylaxis: Lovenox subq  Code Status: Full Family Communication: Pt in room, wife at bedside Disposition Plan: Uncertain  Consults called: GI by EDP Admission status: Inpatient as would require greater than 2 midnight stay to correct metabolic acidosis with IVF and for formal GI work up for acute blood loss anemia   Marylu Lund MD [facility] Pager On Amion  If 7PM-7AM, please contact night-coverage  06/28/2020, 10:55 AM

## 2020-06-28 NOTE — ED Notes (Signed)
Have paged admitting for base insulin coverage to remove from Insulin drip

## 2020-06-28 NOTE — Progress Notes (Signed)
Inpatient Diabetes Program Recommendations  AACE/ADA: New Consensus Statement on Inpatient Glycemic Control (2015)  Target Ranges:  Prepandial:   less than 140 mg/dL      Peak postprandial:   less than 180 mg/dL (1-2 hours)      Critically ill patients:  140 - 180 mg/dL   Lab Results  Component Value Date   GLUCAP 192 (H) 06/28/2020   HGBA1C 7.7 (H) 06/06/2020    Review of Glycemic Control  Diabetes history: DM Outpatient Diabetes medications: Tresiba 7 units qd (if >140 give 15 units) + Novolog 3-4 units tid + Actos 15 mg  Current orders for Inpatient glycemic control: IV insulin  Inpatient Diabetes Program Recommendations:   Pending repeat BMET. When ready to transition from IV insulin: Lantus 7 units -give 2 hrs prior to discontinued IV insulin and cover CBG when IV insulin discontinued. A1c 7.7 so normally fair control of diabetes. Will follow during hospitalization.  Thank you, Nani Gasser. Lakeshia Dohner, RN, MSN, CDE  Diabetes Coordinator Inpatient Glycemic Control Team Team Pager [phone] (8am-5pm) 06/28/2020 12:53 PM

## 2020-06-28 NOTE — ED Notes (Signed)
Admitting MD is to discuss plan with pt and wife.

## 2020-06-28 NOTE — ED Notes (Signed)
Lab has been notified to try and obtain labs.

## 2020-06-28 NOTE — ED Notes (Signed)
Dr Wyline Copas aware of need for base insulin, requested aion gap results which we have not been able to obtain. Lab called again letting them know we need the labs ASAP, Charge nurse also updated.

## 2020-06-28 NOTE — ED Notes (Signed)
ED TO INPATIENT HANDOFF REPORT  Name/Age/Gender Tony Marshall 79 y.o. male  Code Status    Code Status Orders  (From admission, onward)         Start     Ordered   06/28/20 1120  Full code  Continuous        06/28/20 1122        Code Status History    Date Active Date Inactive Code Status Order ID Comments User Context   06/05/2020 1839 06/21/2020 0024 Full Code [id number]  Cherene Altes, MD ED   Advance Care Planning Activity      Home/SNF/Other Home  Chief Complaint DKA (diabetic ketoacidoses) ([place]) [E11.10]  Level of Care/Admitting Diagnosis ED Disposition    ED Disposition Condition [facility] Area: [place] [[id number]]  Level of Care: Stepdown [14]  Admit to SDU based on following criteria: Hemodynamic compromise or significant risk of instability:  Patient requiring short term acute titration and management of vasoactive drips, and invasive monitoring (i.e., CVP and Arterial line).  May admit patient to [name] or [facility] if equivalent level of care is available:: Yes  Covid Evaluation: Asymptomatic Screening Protocol (No Symptoms)  Diagnosis: DKA (diabetic ketoacidoses) [facility]) [[id number]]  Admitting Physician: Velta Addison  Attending Physician: Donne Hazel [[id number]]  Estimated length of stay: 3 - 4 days  Certification:: I certify this patient will need inpatient services for at least 2 midnights       Medical History Past Medical History:  Diagnosis Date  . CLL (chronic lymphocytic leukemia) ([place]) dx'd 2014   progression 12/2019  . Diabetes mellitus without complication ([place])   . Diverticulosis   . External hemorrhoids   . GERD (gastroesophageal reflux disease)   . Hiatal hernia   . History of colon polyps   . Hyperlipidemia   . Hypertension   . Internal hemorrhoids   . Lymphocytosis   . Schatzki's ring   . Sleep apnea     Allergies Allergies  Allergen Reactions  . Glycopyrrolate Rash     IV Location/Drains/Wounds Patient Lines/Drains/Airways Status    Active Line/Drains/Airways    Name Placement date Placement time Site Days   Peripheral IV 06/28/20 Anterior;Left;Proximal Forearm 06/28/20  0713  Forearm  less than 1   Peripheral IV 06/28/20 Right Forearm 06/28/20  1245  Forearm  less than 1   Wound / Incision (Open or Dehisced) 06/08/20 Non-pressure wound;Other (Comment) Buttocks Left nickle size area on left inner buttocks, red and brown  06/08/20  1000  Buttocks  20          Labs/Imaging Results for orders placed or performed during the hospital encounter of 06/28/20 (from the past 48 hour(s))  POC occult blood, ED Provider will collect     Status: Abnormal   Collection Time: 06/28/20  7:13 AM  Result Value Ref Range   Fecal Occult Bld POSITIVE (A) NEGATIVE  CBC with Differential     Status: Abnormal   Collection Time: 06/28/20  7:22 AM  Result Value Ref Range   WBC 5.6 4.0 - 10.5 K/uL   RBC 2.71 (L) 4.22 - 5.81 MIL/uL   Hemoglobin 8.6 (L) 13.0 - 17.0 g/dL   HCT 26.4 (L) 39 - 52 %   MCV 97.4 80.0 - 100.0 fL   MCH 31.7 26.0 - 34.0 pg   MCHC 32.6 30.0 - 36.0 g/dL   RDW 18.5 (H) 11.5 - 15.5 %  Platelets 178 150 - 400 K/uL   nRBC 0.0 0.0 - 0.2 %   Neutrophils Relative % 68 %   Neutro Abs 3.8 1.7 - 7.7 K/uL   Lymphocytes Relative 21 %   Lymphs Abs 1.2 0.7 - 4.0 K/uL   Monocytes Relative 8 %   Monocytes Absolute 0.5 0 - 1 K/uL   Eosinophils Relative 0 %   Eosinophils Absolute 0.0 0 - 0 K/uL   Basophils Relative 0 %   Basophils Absolute 0.0 0 - 0 K/uL   Immature Granulocytes 3 %   Abs Immature Granulocytes 0.15 (H) 0.00 - 0.07 K/uL    Comment: Performed at [facility], [place] [address] [place], [place] [address]  Comprehensive metabolic panel     Status: Abnormal   Collection Time: 06/28/20  7:22 AM  Result Value Ref Range   Sodium 137 135 - 145 mmol/L   Potassium 3.2 (L) 3.5 - 5.1 mmol/L   Chloride 100 98 - 111 mmol/L   CO2 18  (L) 22 - 32 mmol/L   Glucose, Bld 252 (H) 70 - 99 mg/dL    Comment: Glucose reference range applies only to samples taken after fasting for at least 8 hours.   BUN 12 8 - 23 mg/dL   Creatinine, Ser 0.70 0.61 - 1.24 mg/dL   Calcium 8.2 (L) 8.9 - 10.3 mg/dL   Total Protein 4.3 (L) 6.5 - 8.1 g/dL   Albumin 2.2 (L) 3.5 - 5.0 g/dL   AST 13 (L) 15 - 41 U/L   ALT 10 0 - 44 U/L   Alkaline Phosphatase 63 38 - 126 U/L   Total Bilirubin 1.6 (H) 0.3 - 1.2 mg/dL   GFR calc non Af Amer >60 >60 mL/min   GFR calc Af Amer >60 >60 mL/min   Anion gap 19 (H) 5 - 15    Comment: Performed at [facility], [place] [address], [place], [place] [address]  Type and screen     Status: None   Collection Time: 06/28/20  7:22 AM  Result Value Ref Range   ABO/RH(D) A POS    Antibody Screen POS    Sample Expiration 07/01/2020,2359    DAT, IgG POS    Antibody Identification WARM AUTOANTIBODY    Antibody ID,T [place]      WARM AUTOANTIBODY Performed at [place] [address] [place], [place] [address]   Protime-INR     Status: None   Collection Time: 06/28/20  7:22 AM  Result Value Ref Range   Prothrombin Time 14.2 11.4 - 15.2 seconds   INR 1.1 0.8 - 1.2    Comment: (NOTE) INR goal varies based on device and disease states. Performed at [facility], [place] [address] [place], [place] [address]   Urinalysis, Routine w reflex microscopic     Status: Abnormal   Collection Time: 06/28/20  7:58 AM  Result Value Ref Range   Color, Urine YELLOW YELLOW   APPearance CLEAR CLEAR   Specific Gravity, Urine 1.018 1.005 - 1.030   pH 5.0 5.0 - 8.0   Glucose, UA >=500 (A) NEGATIVE mg/dL   Hgb urine dipstick NEGATIVE NEGATIVE   Bilirubin Urine NEGATIVE NEGATIVE   Ketones, ur 80 (A) NEGATIVE mg/dL   Protein, ur NEGATIVE NEGATIVE mg/dL   Nitrite NEGATIVE NEGATIVE   Leukocytes,Ua NEGATIVE NEGATIVE   RBC / HPF 0-5 0 - 5 RBC/hpf   WBC, UA 0-5 0 - 5 WBC/hpf  Bacteria, UA RARE (A) NONE SEEN   Mucus PRESENT    Hyaline Casts, UA PRESENT     Comment: Performed at [facility], [place] [address], [place], [place] [address]  CBG monitoring, ED     Status: Abnormal   Collection Time: 06/28/20 10:54 AM  Result Value Ref Range   Glucose-Capillary 252 (H) 70 - 99 mg/dL    Comment: Glucose reference range applies only to samples taken after fasting for at least 8 hours.  CBG monitoring, ED     Status: Abnormal   Collection Time: 06/28/20 12:21 PM  Result Value Ref Range   Glucose-Capillary 192 (H) 70 - 99 mg/dL    Comment: Glucose reference range applies only to samples taken after fasting for at least 8 hours.  CBG monitoring, ED     Status: Abnormal   Collection Time: 06/28/20  1:15 PM  Result Value Ref Range   Glucose-Capillary 151 (H) 70 - 99 mg/dL    Comment: Glucose reference range applies only to samples taken after fasting for at least 8 hours.  Blood gas, venous     Status: Abnormal   Collection Time: 06/28/20  1:25 PM  Result Value Ref Range   pH, Ven 7.370 7.25 - 7.43   pCO2, Ven 38.4 (L) 44 - 60 mmHg   pO2, Ven 31.9 (LL) 32 - 45 mmHg    Comment: CRITICAL RESULT CALLED TO, READ BACK BY AND VERIFIED WITH: LAWDERMILK,J. RN @1550  06/28/20 BILLINGSLEY,L    Bicarbonate 21.7 20.0 - 28.0 mmol/L   Acid-base deficit 2.7 (H) 0.0 - 2.0 mmol/L   O2 Saturation 50.0 %   Patient temperature 98.6    Sample type VENOUS     Comment: Performed at [facility], [place] [address], [place], [place] [address]  Basic metabolic panel     Status: Abnormal   Collection Time: 06/28/20  1:25 PM  Result Value Ref Range   Sodium 140 135 - 145 mmol/L   Potassium 2.5 (LL) 3.5 - 5.1 mmol/L    Comment: CRITICAL RESULT CALLED TO, READ BACK BY AND VERIFIED WITH: Josh Nicolosi,J. RN @1626  06/28/20 BILLINGSLEY,L    Chloride 105 98 - 111 mmol/L   CO2 23 22 - 32 mmol/L   Glucose, Bld 83 70 - 99 mg/dL    Comment: Glucose reference range  applies only to samples taken after fasting for at least 8 hours.   BUN 11 8 - 23 mg/dL   Creatinine, Ser 0.55 (L) 0.61 - 1.24 mg/dL   Calcium 7.9 (L) 8.9 - 10.3 mg/dL   GFR calc non Af Amer >60 >60 mL/min   GFR calc Af Amer >60 >60 mL/min   Anion gap 12 5 - 15    Comment: Performed at [facility], [place] [address] [place], [place] [address]  HIV Antibody (routine testing w rflx)     Status: None   Collection Time: 06/28/20  1:25 PM  Result Value Ref Range   HIV Screen 4th Generation wRfx Non Reactive Non Reactive    Comment: Performed at [facility], [place] [address], [place], [place] [address]  CBC Once     Status: Abnormal   Collection Time: 06/28/20  1:25 PM  Result Value Ref Range   WBC 5.3 4.0 - 10.5 K/uL   RBC 2.74 (L) 4.22 - 5.81 MIL/uL   Hemoglobin 8.5 (L) 13.0 - 17.0 g/dL   HCT 26.3 (L) 39 - 52 %   MCV  96.0 80.0 - 100.0 fL   MCH 31.0 26.0 - 34.0 pg   MCHC 32.3 30.0 - 36.0 g/dL   RDW 18.6 (H) 11.5 - 15.5 %   Platelets 194 150 - 400 K/uL   nRBC 0.0 0.0 - 0.2 %    Comment: Performed at [facility], [place] [address] [place], [place] [address]  SARS Coronavirus 2 by RT PCR (hospital order, performed in [facility] hospital lab) Nasopharyngeal Nasopharyngeal Swab     Status: None   Collection Time: 06/28/20  1:38 PM   Specimen: Nasopharyngeal Swab  Result Value Ref Range   SARS Coronavirus 2 NEGATIVE NEGATIVE    Comment: (NOTE) SARS-CoV-2 target nucleic acids are NOT DETECTED.  The SARS-CoV-2 RNA is generally detectable in upper and lower respiratory specimens during the acute phase of infection. The lowest concentration of SARS-CoV-2 viral copies this assay can detect is 250 copies / mL. A negative result does not preclude SARS-CoV-2 infection and should not be used as the sole basis for treatment or other patient management decisions.  A negative result may occur with improper specimen collection / handling, submission of  specimen other than nasopharyngeal swab, presence of viral mutation(s) within the areas targeted by this assay, and inadequate number of viral copies (<250 copies / mL). A negative result must be combined with clinical observations, patient history, and epidemiological information.  Fact Sheet for Patients:   StrictlyIdeas.no  Fact Sheet for Healthcare Providers: BankingDealers.co.za  This test is not yet approved or  cleared by the [place] FDA and has been authorized for detection and/or diagnosis of SARS-CoV-2 by FDA under an Emergency Use Authorization (EUA).  This EUA will remain in effect (meaning this test can be used) for the duration of the COVID-19 declaration under Section 564(b)(1) of the Act, 21 U.S.C. section 360bbb-3(b)(1), unless the authorization is terminated or revoked sooner.  Performed at [facility], [place] [address] [place], [place] [address]   CBG monitoring, ED     Status: Abnormal   Collection Time: 06/28/20  2:38 PM  Result Value Ref Range   Glucose-Capillary 117 (H) 70 - 99 mg/dL    Comment: Glucose reference range applies only to samples taken after fasting for at least 8 hours.  CBG monitoring, ED     Status: Abnormal   Collection Time: 06/28/20  3:54 PM  Result Value Ref Range   Glucose-Capillary 105 (H) 70 - 99 mg/dL    Comment: Glucose reference range applies only to samples taken after fasting for at least 8 hours.  CBG monitoring, ED     Status: None   Collection Time: 06/28/20  3:56 PM  Result Value Ref Range   Glucose-Capillary 98 70 - 99 mg/dL    Comment: Glucose reference range applies only to samples taken after fasting for at least 8 hours.  CBG monitoring, ED     Status: None   Collection Time: 06/28/20  5:10 PM  Result Value Ref Range   Glucose-Capillary 93 70 - 99 mg/dL    Comment: Glucose reference range applies only to samples taken after fasting for at least 8  hours.  CBG monitoring, ED     Status: Abnormal   Collection Time: 06/28/20  6:18 PM  Result Value Ref Range   Glucose-Capillary 127 (H) 70 - 99 mg/dL    Comment: Glucose reference range applies only to samples taken after fasting for at least 8 hours.  CBG monitoring, ED  Status: Abnormal   Collection Time: 06/28/20  7:15 PM  Result Value Ref Range   Glucose-Capillary 141 (H) 70 - 99 mg/dL    Comment: Glucose reference range applies only to samples taken after fasting for at least 8 hours.   VAS [place] LOWER EXTREMITY VENOUS (DVT) (ONLY [facility] & [facility] 7a-7p)  Result Date: 06/28/2020  Lower Venous DVTStudy Indications: Edema.  Comparison       Piror studies RT LEV 06-11-2020 and 06-07-2020. Negative for Study:           DVT. Performing Technologist: Darlin Coco  Examination Guidelines: A complete evaluation includes B-mode imaging, spectral Doppler, color Doppler, and power Doppler as needed of all accessible portions of each vessel. Bilateral testing is considered an integral part of a complete examination. Limited examinations for reoccurring indications may be performed as noted. The reflux portion of the exam is performed with the patient in reverse Trendelenburg.  +---------+---------------+---------+-----------+----------+--------------+ RIGHT    CompressibilityPhasicitySpontaneityPropertiesThrombus Aging +---------+---------------+---------+-----------+----------+--------------+ CFV      Full           Yes      Yes                                 +---------+---------------+---------+-----------+----------+--------------+ SFJ      Full                                                        +---------+---------------+---------+-----------+----------+--------------+ FV Prox  Full                                                        +---------+---------------+---------+-----------+----------+--------------+ FV Mid   Full                                                         +---------+---------------+---------+-----------+----------+--------------+ FV DistalFull                                                        +---------+---------------+---------+-----------+----------+--------------+ PFV      Full                                                        +---------+---------------+---------+-----------+----------+--------------+ POP      Full           Yes      Yes                                 +---------+---------------+---------+-----------+----------+--------------+ PTV      Full                                                        +---------+---------------+---------+-----------+----------+--------------+  PERO     Full                                                        +---------+---------------+---------+-----------+----------+--------------+   +----+---------------+---------+-----------+----------+--------------+ LEFTCompressibilityPhasicitySpontaneityPropertiesThrombus Aging +----+---------------+---------+-----------+----------+--------------+ CFV Full           Yes      Yes                                 +----+---------------+---------+-----------+----------+--------------+     Summary: RIGHT: - There is no evidence of deep vein thrombosis in the lower extremity.  - No cystic structure found in the popliteal fossa.  LEFT: - No evidence of common femoral vein obstruction.  *See table(s) above for measurements and observations. Electronically signed by Curt Jews MD on 06/28/2020 at 7:40:49 PM.    Final     Pending Labs Unresulted Labs (From admission, onward) Comment          Start     Ordered   06/29/20 0500  Comprehensive metabolic panel  Tomorrow morning,   R        06/28/20 1122   06/29/20 0500  Magnesium  Tomorrow morning,   R        06/28/20 1338   06/28/20 1600  Hemoglobin and hematocrit, blood  Once,   STAT        06/28/20 1122   06/28/20 1250  CBC Once  Now then every 8 hours,   R (with  STAT occurrences)      06/28/20 1250   06/28/20 7001  Basic metabolic panel  (Diabetes Ketoacidosis (DKA))  STAT Now then every 4 hours ,   STAT      06/28/20 1122   06/28/20 1031  Beta-hydroxybutyric acid  (Diabetes Ketoacidosis (DKA))  Now then every 8 hours,   STAT      06/28/20 1031          Vitals/Pain Today's Vitals   06/28/20 1910 06/28/20 1911 06/28/20 1942 06/28/20 2000  BP: (!) 145/69 (!) 145/69  (!) 126/101  Pulse:  98 94 98  Resp:  18 16 (!) 21  Temp:      TempSrc:      SpO2:  100% 100% 100%  Weight:      Height:      PainSc:        Isolation Precautions Enteric precautions (UV disinfection)  Medications Medications  insulin regular, human (MYXREDLIN) 100 units/ 100 mL infusion (2.6 Units/hr Intravenous Rate/Dose Change 06/28/20 1921)  0.9 %  sodium chloride infusion (0 mLs Intravenous Hold 06/28/20 1610)  dextrose 5 %-0.45 % sodium chloride infusion ( Intravenous New Bag/Given 06/28/20 1237)  dextrose 50 % solution 0-50 mL (has no administration in time range)  sodium chloride 0.9 % bolus 1,360 mL (0 mLs Intravenous Hold 06/28/20 1611)  acetaminophen (TYLENOL) tablet 650 mg (has no administration in time range)    Or  acetaminophen (TYLENOL) suppository 650 mg (has no administration in time range)  polyethylene glycol (MIRALAX / GLYCOLAX) packet 17 g (17 g Oral Given 06/28/20 1459)  bisacodyl (DULCOLAX) suppository 10 mg (has no administration in time range)  carvedilol (COREG) tablet 12.5 mg (12.5 mg Oral Given 06/28/20 1910)  rosuvastatin (CRESTOR) tablet 5 mg (5 mg Oral Given  06/28/20 1911)  sacubitril-valsartan (ENTRESTO) 24-26 mg per tablet (1 tablet Oral Given 06/28/20 1910)  potassium chloride 10 mEq in 100 mL IVPB (10 mEq Intravenous New Bag/Given 06/28/20 1923)  insulin glargine (LANTUS) injection 5 Units (5 Units Subcutaneous Given 06/28/20 1815)  insulin aspart (novoLOG) injection 0-9 Units (has no administration in time range)  insulin aspart (novoLOG)  injection 0-5 Units (has no administration in time range)  sodium chloride 0.9 % bolus 1,000 mL (0 mLs Intravenous Stopped 06/28/20 0847)  sodium chloride 0.9 % bolus 1,360 mL (0 mL/kg  68 kg Intravenous Stopped 06/28/20 1237)  bisacodyl (DULCOLAX) EC tablet 5 mg (5 mg Oral Given 06/28/20 1402)  magnesium sulfate IVPB 4 g 100 mL (0 g Intravenous Stopped 06/28/20 1646)    Mobility walks with person assist

## 2020-06-28 NOTE — ED Notes (Signed)
Date and time results received: 06/28/20 1550  Test: pO2 Critical Value: 31.9  Name of Provider Notified: Wyline Copas, MD

## 2020-06-28 NOTE — ED Notes (Signed)
Attempted to call report to 2W, but RN states she is unable to take report at this time due to shift change.

## 2020-06-28 NOTE — ED Notes (Signed)
Vascular in room at present time

## 2020-06-28 NOTE — Telephone Encounter (Signed)
Pt in the ED 'bleeding from rectum".

## 2020-06-28 NOTE — Plan of Care (Signed)
New admission this shift. Off Insulin drip at this time. Patient is mildly disoriented, but managed without incident thus far. Will continue to monitor for changes.  Problem: Education: Goal: Ability to describe self-care measures that may prevent or decrease complications (Diabetes Survival Skills Education) will improve Outcome: Progressing Goal: Individualized Educational Video(s) Outcome: Progressing   Problem: Cardiac: Goal: Ability to maintain an adequate cardiac output will improve Outcome: Progressing   Problem: Health Behavior/Discharge Planning: Goal: Ability to identify and utilize available resources and services will improve Outcome: Progressing Goal: Ability to manage health-related needs will improve Outcome: Progressing   Problem: Fluid Volume: Goal: Ability to achieve a balanced intake and output will improve Outcome: Progressing   Problem: Metabolic: Goal: Ability to maintain appropriate glucose levels will improve Outcome: Progressing   Problem: Nutritional: Goal: Maintenance of adequate nutrition will improve Outcome: Progressing Goal: Maintenance of adequate weight for body size and type will improve Outcome: Progressing   Problem: Respiratory: Goal: Will regain and/or maintain adequate ventilation Outcome: Progressing   Problem: Urinary Elimination: Goal: Ability to achieve and maintain adequate renal perfusion and functioning will improve Outcome: Progressing

## 2020-06-28 NOTE — ED Notes (Signed)
Date and time results received: 06/28/20 4:26 PM  Test: Potassium Critical Value: 2.5  Name of Provider Notified: Wyline Copas, MD

## 2020-06-28 NOTE — ED Notes (Signed)
Attempted to obtain blood for admission orders, unsuccessful. Will ask someone else to attempt

## 2020-06-28 NOTE — Progress Notes (Signed)
Lower extremity venous RT study completed.   See Cv Proc for preliminary results.   Tony Marshall  

## 2020-06-28 NOTE — Progress Notes (Signed)
[place] placed and ordering remote sitter after patient pulled off primofit during incontinence episode. Patient is calm, confused to time, place, and situation.

## 2020-06-28 NOTE — ED Provider Notes (Signed)
[place] DEPT Provider Note   CSN: [id number] Arrival date & time: 06/28/20  3151     History No chief complaint on file.   Tony Marshall is a 79 y.o. male.  The history is provided by the patient and medical records. No language interpreter was used.     79 year old male with history of CLL, GERD, hypertension, hyperlipidemia, presenting complaining of generalized weakness and diarrhea.  Patient report for the past 2 weeks he has been feeling increasingly fatigue with lightheadedness.  He also has had recurrent loose stools and diarrhea.  States he has approximately 8-10 episodes per day.  He also mentioned that his pet has been having bleeding and he is concerned about his pet. When asked if he has noticed any rectal bleeding, pt denies.  However, triage note mentioned his wife saw blood and blood clots in his stool this morning. He does not complain of any fever chills no headache no chest pain or trouble breathing no abdominal pain or dysuria.  He mention being admitted to the hospital nearly a month ago for similar symptoms. Patient report he is up-to-date with immunizations including Covid vaccination.  He lives at home with his wife.  Patient appears to be a poor historian, level 5 caveats.  Past Medical History:  Diagnosis Date   CLL (chronic lymphocytic leukemia) ([place]) dx'd 2014   progression 12/2019   Diabetes mellitus without complication (HCC)    Diverticulosis    External hemorrhoids    GERD (gastroesophageal reflux disease)    Hiatal hernia    History of colon polyps    Hyperlipidemia    Hypertension    Internal hemorrhoids    Lymphocytosis    Schatzki's ring    Sleep apnea     Patient Active Problem List   Diagnosis Date Noted   Dehydration 06/27/2020   Malnutrition of moderate degree 06/09/2020   Hyperglycemia due to diabetes mellitus (HCC)    Leukopenia    Acute combined systolic and diastolic heart  failure (HCC)    Cardiogenic shock (HCC)    Altered mental status    Failure to thrive in adult    Symptomatic anemia    Acute respiratory failure (HCC)    Increased anion gap metabolic acidosis 76/16/0737   Goals of care, counseling/discussion 03/28/2020   Encounter for antineoplastic chemotherapy 03/28/2020   GERD (gastroesophageal reflux disease) 05/13/2014   History of colonic polyps 05/13/2014   Hiatal hernia 05/13/2014   CLL (chronic lymphocytic leukemia) ([place]) 10/25/2013   ED (erectile dysfunction) 10/20/2013   Lymphocytosis 09/23/2013   Essential hypertension 07/21/2013   Pure hypercholesterolemia 07/21/2013   Diabetes mellitus ([place]) 07/20/2013    Past Surgical History:  Procedure Laterality Date   BLEPHAROPLASTY     COLONOSCOPY  2012   HEMORRHOID SURGERY     Sclerotherapy   KNEE SURGERY Right 2002   POLYPECTOMY     SHOULDER SURGERY Right 2007       Family History  Problem Relation Age of Onset   Kidney disease Mother    CAD Maternal Aunt    Colon cancer Neg Hx    Esophageal cancer Neg Hx    Rectal cancer Neg Hx    Stomach cancer Neg Hx     Social History   Tobacco Use   Smoking status: Never Smoker   Smokeless tobacco: Former Systems developer    Types: Nurse, children's Use: Never used  Substance Use Topics   Alcohol  use: Yes    Alcohol/week: 6.0 standard drinks    Types: 6 Cans of beer per week   Drug use: No    Home Medications Prior to Admission medications   Medication Sig Start Date End Date Taking? Authorizing Provider  acetaminophen (TYLENOL) 500 MG tablet Take 500 mg by mouth every 6 (six) hours as needed for moderate pain.    [provider]  allopurinol (ZYLOPRIM) 100 MG tablet Take 1 tablet (100 mg total) by mouth 2 (two) times daily. 03/28/20   Curt Bears, MD  ALPRAZolam Duanne Moron) 0.25 MG tablet Take 1 tablet (0.25 mg total) by mouth at bedtime as needed for anxiety. 06/21/20   Curt Bears, MD  aspirin 81 MG EC tablet Take 81 mg by mouth daily. Swallow whole.    [provider]  benazepril (LOTENSIN) 20 MG tablet Take 20 mg by mouth daily. 06/26/20   [provider]  carvedilol (COREG) 12.5 MG tablet Take 1 tablet (12.5 mg total) by mouth 2 (two) times daily with a meal. 06/20/20 07/20/20  Antonieta Pert, MD  diphenoxylate-atropine (LOMOTIL) 2.5-0.025 MG tablet 1 to 2 tablets PO QID prn diarrhea 06/23/20   Tanner, Lyndon Code., PA-C  dronabinol (MARINOL) 2.5 MG capsule Take 1 capsule (2.5 mg total) by mouth 2 (two) times daily before a meal. 05/30/20   Heilingoetter, Cassandra L, PA-C  esomeprazole (NEXIUM) 40 MG capsule TAKE 1 CAPSULE BY MOUTH EVERY MORNING 30 MINUTES BEFORE BREAKFAST Patient taking differently: Take 40 mg by mouth daily.  04/28/20   Pyrtle, Lajuan Lines, MD  feeding supplement, GLUCERNA SHAKE, (GLUCERNA SHAKE) LIQD Take 237 mLs by mouth 3 (three) times daily with meals. 06/20/20 07/20/20  Antonieta Pert, MD  folic acid (FOLVITE) 1 MG tablet Take 1 tablet (1 mg total) by mouth daily. 06/21/20 07/21/20  Antonieta Pert, MD  glucagon (GLUCAGON EMERGENCY) 1 MG injection Inject 1 mg into the skin once as needed (For severe low blood sugar). 06/14/19   Elayne Snare, MD  insulin aspart (NOVOLOG FLEXPEN) 100 UNIT/ML FlexPen Inject 3-4 Units into the skin 3 (three) times daily with meals. Inject 3-4 units under the skin three times daily before meals.     [provider]  insulin degludec (TRESIBA FLEXTOUCH) 100 UNIT/ML FlexTouch Pen inject 7 units daily. if fasting blood sugars are over 140 increase dosage up to 15 units daily as directed. 06/20/20   Antonieta Pert, MD  loperamide (IMODIUM A-D) 2 MG tablet Take 2 mg by mouth 4 (four) times daily as needed for diarrhea or loose stools.    [provider]  metFORMIN (GLUCOPHAGE-XR) 500 MG 24 hr tablet Take 1 tablet (500 mg total) by mouth daily with supper. Patient not taking: Reported on 06/26/2020 09/16/19   Elayne Snare, MD    metroNIDAZOLE (FLAGYL) 250 MG tablet Take 1 tablet (250 mg total) by mouth 3 (three) times daily. 06/26/20   Elayne Snare, MD  [facility] VERIO test strip USE 1 STRIP TO CHECK GLUCOSE THREE TIMES DAILY 02/24/20   Elayne Snare, MD  oxyCODONE (OXY IR/ROXICODONE) 5 MG immediate release tablet Take 1 tablet (5 mg total) by mouth every 4 (four) hours as needed for severe pain. 06/23/20   Tanner, Lyndon Code., PA-C  oxymetazoline (AFRIN) 0.05 % nasal spray Place 1 spray into both nostrils 2 (two) times daily as needed for congestion.    [provider]  pioglitazone (ACTOS) 15 MG tablet Take 15 mg by mouth daily. 06/26/20   [provider]  potassium chloride SA (KLOR-CON) 20 MEQ tablet Take 1 tablet (20 mEq total) by mouth 2 (two) times daily. 05/12/20   Tanner, Lyndon Code., PA-C  prochlorperazine (COMPAZINE) 10 MG tablet Take 1 tablet (10 mg total) by mouth every 6 (six) hours as needed for nausea or vomiting. 03/28/20   Curt Bears, MD  rosuvastatin (CRESTOR) 5 MG tablet TAKE 1 TABLET BY MOUTH EVERY DAY 04/21/20   Elayne Snare, MD  sacubitril-valsartan (ENTRESTO) 24-26 MG Take 1 tablet by mouth 2 (two) times daily. 06/20/20 07/20/20  Antonieta Pert, MD  senna (SENOKOT) 8.6 MG TABS tablet Take 1 tablet (8.6 mg total) by mouth daily. Patient not taking: Reported on 06/27/2020 06/21/20   Antonieta Pert, MD  thiamine 100 MG tablet Take 1 tablet (100 mg total) by mouth daily. 06/21/20 07/21/20  Antonieta Pert, MD  traMADol (ULTRAM) 50 MG tablet Take 1 tablet (50 mg total) by mouth every 12 (twelve) hours as needed. 06/21/20   Curt Bears, MD    Allergies    Glycopyrrolate  Review of Systems   Review of Systems  Unable to perform ROS: Mental status change    Physical Exam Updated Vital Signs BP 121/69 (BP Location: Right Arm)    Pulse 83    Temp 98 F (36.7 C) (Oral)    Resp 17    Ht 5\' 7"  (1.702 m)    Wt 68 kg    SpO2 100%    BMI 23.49 kg/m   Physical Exam Vitals and nursing note reviewed.  Constitutional:       General: He is not in acute distress.    Appearance: He is well-developed.     Comments: Patient is pale appearing however resting comfortably in bed in no acute discomfort.  HENT:     Head: Atraumatic.  Eyes:     Conjunctiva/sclera: Conjunctivae normal.  Cardiovascular:     Rate and Rhythm: Normal rate and regular rhythm.     Pulses: Normal pulses.     Heart sounds: Normal heart sounds.  Pulmonary:     Effort: Pulmonary effort is normal.     Breath sounds: Normal breath sounds. No wheezing, rhonchi or rales.  Abdominal:     Palpations: Abdomen is soft.     Tenderness: There is no abdominal tenderness.  Genitourinary:    Comments: Chaperone present during exam.  Copious amount of maroon-colored blood noted in adult diaper.  No obvious mass.  Normal rectal tone. Musculoskeletal:     Cervical back: Neck supple.     Comments: Moving all 4 extremities with equal strength  Skin:    Findings: Erythema (Right lower extremity: Erythema noted to the posterior calf with warmth and minimal tenderness to palpation.) present. No rash.  Neurological:     Mental Status: He is alert.     GCS: GCS eye subscore is 4. GCS verbal subscore is 5. GCS motor subscore is 6.     Motor: Weakness present.     Comments: Alert to self and place but mistakenly thought the year was 2001, the month as February, but recognized current president.  Psychiatric:        Mood and Affect: Mood normal.     ED Results / Procedures / Treatments   Labs (all labs ordered are listed, but only abnormal results are displayed) Labs Reviewed  CBC WITH DIFFERENTIAL/PLATELET - Abnormal; Notable for the following components:      Result Value   RBC 2.71 (*)    Hemoglobin 8.6 (*)  HCT 26.4 (*)    RDW 18.5 (*)    Abs Immature Granulocytes 0.15 (*)    All other components within normal limits  COMPREHENSIVE METABOLIC PANEL - Abnormal; Notable for the following components:   Potassium 3.2 (*)    CO2 18 (*)    Glucose, Bld  252 (*)    Calcium 8.2 (*)    Total Protein 4.3 (*)    Albumin 2.2 (*)    AST 13 (*)    Total Bilirubin 1.6 (*)    Anion gap 19 (*)    All other components within normal limits  URINALYSIS, ROUTINE W REFLEX MICROSCOPIC - Abnormal; Notable for the following components:   Glucose, UA >=500 (*)    Ketones, ur 80 (*)    Bacteria, UA RARE (*)    All other components within normal limits  POC OCCULT BLOOD, ED - Abnormal; Notable for the following components:   Fecal Occult Bld POSITIVE (*)    All other components within normal limits  CBG MONITORING, ED - Abnormal; Notable for the following components:   Glucose-Capillary 252 (*)    All other components within normal limits  C DIFFICILE QUICK SCREEN W PCR REFLEX  SARS CORONAVIRUS 2 BY RT PCR (HOSPITAL ORDER, [place] [facility])  PROTIME-INR  BETA-HYDROXYBUTYRIC ACID  BETA-HYDROXYBUTYRIC ACID  BLOOD GAS, VENOUS  TYPE AND SCREEN    EKG None  Radiology DG Lumbar Spine 2-3 Views  Result Date: 06/27/2020 CLINICAL DATA:  Low back pain.  No known injury. EXAM: LUMBAR SPINE - 2-3 VIEW COMPARISON:  CT chest, abdomen and pelvis 06/07/2020. FINDINGS: Vertebral body height and alignment are maintained. Intervertebral disc space height is normal. Paraspinous structures demonstrate atherosclerosis. IMPRESSION: Normal appearing lumbar spine. Aortic Atherosclerosis (ICD10-I70.0). Electronically Signed   By: Inge Rise M.D.   On: 06/27/2020 11:01    Procedures .Critical Care Performed by: Domenic Moras, PA-C Authorized by: Domenic Moras, PA-C   Critical care provider statement:    Critical care time (minutes):  32   Critical care was time spent personally by me on the following activities:  Discussions with consultants, evaluation of patient's response to treatment, examination of patient, ordering and performing treatments and interventions, ordering and review of laboratory studies, ordering and review of radiographic  studies, pulse oximetry, re-evaluation of patient's condition, obtaining history from patient or surrogate and review of old charts   (including critical care time)  Medications Ordered in ED Medications  insulin regular, human (MYXREDLIN) 100 units/ 100 mL infusion (has no administration in time range)  0.9 %  sodium chloride infusion (has no administration in time range)  dextrose 5 %-0.45 % sodium chloride infusion (has no administration in time range)  dextrose 50 % solution 0-50 mL (has no administration in time range)  sodium chloride 0.9 % bolus 1,360 mL (has no administration in time range)  potassium chloride 10 mEq in 100 mL IVPB (has no administration in time range)  sodium chloride 0.9 % bolus 1,000 mL (0 mLs Intravenous Stopped 06/28/20 0847)    ED Course  I have reviewed the triage vital signs and the nursing notes.  Pertinent labs & imaging results that were available during my care of the patient were reviewed by me and considered in my medical decision making (see chart for details).    MDM Rules/Calculators/A&P  BP (!) 145/78    Pulse 84    Temp 98 F (36.7 C) (Oral)    Resp 15    Ht 5\' 7"  (1.702 m)    Wt 68 kg    SpO2 99%    BMI 23.49 kg/m   Final Clinical Impression(s) / ED Diagnoses Final diagnoses:  Diabetic ketoacidosis without coma associated with type 2 diabetes mellitus (HCC)  Lower GI bleed  Anemia associated with acute blood loss    Rx / DC Orders ED Discharge Orders    None     7:00 AM Patient here with complaints of generalized weakness, multiple episodes of diarrhea per day for over a week as well as bright red blood per rectum.  On exam, patient has copious amount of maroon color stool on his adult briefs.  He is pale appearing.  He reportedly was anemic requiring blood transfusion several weeks ago when he was hospitalized.  Upon reviewing his previous hospitalization on 6/21, patient was admitted with diarrhea and  malaise.  He was found to be hypotensive, septic with elevated lactic acid and was admitted to ICU.  He also received Ancef for septic shock due to right lower extremity cellulitis in the setting of immunosuppressants with CLL.  In the setting of recurrent diarrhea, recently on antibiotic, will check for C. difficile.  His hemoglobin from a day ago was 8.8.  This is similar to his baseline.  He has erythema warmth and tenderness to his right lower extremity.  This is likely cellulitis however, I have not seen any DVT study therefore will obtain venous Doppler ultrasound of his right lower extremity during this ER visit.  Work-up initiated.  Care discussed with Dr. Karle Starch.  10:32 AM Labs consistent with DKA, will initiate glucostabilizing protocol.  Due to symptomatic anemia, and dka, with possible c.diff will consult for admission.    10:52 AM Appreciate consultation from [facility] Dr. Wyline Copas who agrees to see and admit pt.  He request GI to be involve in pt care as pt will likely need a colonoscopy during admission.  I have consulted GI specialist who will place her on their list to be seen.     Domenic Moras, PA-C 06/28/20 1116    Horton, Barbette Hair, MD 07/10/20 [phone]

## 2020-06-28 NOTE — ED Triage Notes (Signed)
Pt has leukemia and has had diarrhea the last few days and this morning she saw blood in his stool Pt also states that he's had some intermittent altered mental status states

## 2020-06-28 NOTE — ED Notes (Signed)
Pt resting without complaints at present time, awaiting test results in order to update pt.

## 2020-06-28 NOTE — ED Notes (Signed)
2 failed attempt to collect labs

## 2020-06-28 NOTE — Consult Note (Signed)
Referring Provider:  [facility]         Primary Care Physician:  Elayne Snare, MD Primary Gastroenterologist:   Zenovia Jarred, MD           We were asked to see this patient for:  GI bleed                ASSESSMENT /  PLAN    Tony Marshall is a 79 y.o. male PMH significant for, but not necessarily limited to, GERD, hiatal hernia, Schatzki's ring, gastritis, remote colon polyps, diverticulosis, CLL diagnosed 2014, HTN, hyperlipidemia, DM,                                                                                                                                   # Rectal bleeding / fecal impaction --Hemodynamically stable. Hgb is at baseline right now.  --Etiology not clear. Wife mentions black stools at home but takes iron. Early this am he passed dark red blood clots. Suspect lower GI bleed , maybe diverticular but stercoral ulcer also possible as he is impacted on DRE.  --Would still place on PO BID empirically.  --First need to clear impaction with enemas and miralax BID --Monitor bleeding / CBC for now. Last CBC was ~ 730am, will repeat now and Q 8 hours                            --Will stop Lovenox, should use SCDs instead if appropriate  # Loose stools for weeks.  --I suspect this is overflow diarrhea from constipation. He has been taking several anti-diarrheal agents a day for " diarrhea".  --Hold home anti-diarrhea agents --Purge bowels. --Will d/c C-diff order. If diarrhea persists after disimpaction then can reorder.    # DKA --on insulin drip  # CLL --On chemotherapy  # Chronic anemia --stable. On chemotherapy. Takes iron at home   HPI:    Chief Complaint: blood in stool  Tony Marshall is a 79 y.o. male with PMH as above.  He was last seen in our office early April of this year with complaints of poor appetite, altered taste.  He has a history of anemia but stopped it due to intestinal gas/flatulence.  Diminished appetite felt to be related  possibly to oral iron or maybe CLL. We recommended CT scan of the abdomen and pelvis to look for abdominal adenopathy or other pathology and also updated labs. His Hemoglobin had declined by 2 g in the absence of overt bleeding.  CT scan w/ contrast 04/05/20 showed multiple small lymph nodes throughout the small bowel mesentery, enlarged periaortic retroperitoneal lymph nodes, splenomegaly. Results sent to Oncology, we haven't seen him back for follow up.   Patient started on chemotherapy 04/05/20. He has since had a prolonged admission for DKA, possible sepsis, pancytopenia, cellulitis in late June. Required pressors, treated with antibiotics.Wife says  patient was constipated while in hospital. After bowel purge he developed diarrhea and it has never really stopped. At home, despite two anti-diarrheals he has continued to have watery stool. Stool has been black but takes iron.     Yesterday: Patient seen in Oncology office with anorexia, fatigue and diarrhea. His K+ was 3.2, hgb at baseline ~ 8.8.  Plan was to give IV fluids, electrolytes in office and check stool for C-diff.   This admission:  Patient brought to ED after passing blood with clots from rectum around 4 am. He denies abdominal pain, nausea, vomiting. Takes daily baby asa, no other NSAIDs. Also having some mental status.  On EDP's exam he had a large amount of maroon stool in diaper. Hgb 8.6 ( at baseline). BUN 18, K+ 3.2, glucose 252, elevated anion gap of 19, albumin 2.2, total bilir 1.6, remainder of LFTs normal. He is hemodynamically stable.   PREVIOUS ENDOSCOPIC EVALUATIONS / GI STUDIES :  EGD Oct 2020 For dysphagia Mild Schatzki ring. Dilated to 18 mm with balloon. - 3-4 cm hiatal hernia. - Gastritis as above. Biopsied. - Normal examined duodenum.  Colonoscopy --polyp surveillance --Complete exam, good prep -Severe diverticulosis in the sigmoid colon, in the descending colon, at the splenic flexure, at the hepatic flexure and in  the ascending colon. - External and internal hemorrhoids. - The examination was otherwise normal. - No specimens collected. Past Medical History:  Diagnosis Date  . CLL (chronic lymphocytic leukemia) ([place]) dx'd 2014   progression 12/2019  . Diabetes mellitus without complication ([place])   . Diverticulosis   . External hemorrhoids   . GERD (gastroesophageal reflux disease)   . Hiatal hernia   . History of colon polyps   . Hyperlipidemia   . Hypertension   . Internal hemorrhoids   . Lymphocytosis   . Schatzki's ring   . Sleep apnea     Past Surgical History:  Procedure Laterality Date  . BLEPHAROPLASTY    . COLONOSCOPY  2012  . HEMORRHOID SURGERY     Sclerotherapy  . KNEE SURGERY Right 2002  . POLYPECTOMY    . SHOULDER SURGERY Right 2007    Prior to Admission medications   Medication Sig Start Date End Date Taking? Authorizing Provider  acetaminophen (TYLENOL) 500 MG tablet Take 500 mg by mouth every 6 (six) hours as needed for moderate pain.    [provider]  allopurinol (ZYLOPRIM) 100 MG tablet Take 1 tablet (100 mg total) by mouth 2 (two) times daily. 03/28/20   Curt Bears, MD  ALPRAZolam Duanne Moron) 0.25 MG tablet Take 1 tablet (0.25 mg total) by mouth at bedtime as needed for anxiety. 06/21/20   Curt Bears, MD  aspirin 81 MG EC tablet Take 81 mg by mouth daily. Swallow whole.    [provider]  benazepril (LOTENSIN) 20 MG tablet Take 20 mg by mouth daily. 06/26/20   [provider]  carvedilol (COREG) 12.5 MG tablet Take 1 tablet (12.5 mg total) by mouth 2 (two) times daily with a meal. 06/20/20 07/20/20  Antonieta Pert, MD  diphenoxylate-atropine (LOMOTIL) 2.5-0.025 MG tablet 1 to 2 tablets PO QID prn diarrhea 06/23/20   Tanner, Lyndon Code., PA-C  dronabinol (MARINOL) 2.5 MG capsule Take 1 capsule (2.5 mg total) by mouth 2 (two) times daily before a meal. 05/30/20   Heilingoetter, Cassandra L, PA-C  esomeprazole (NEXIUM) 40 MG capsule TAKE 1 CAPSULE BY  MOUTH EVERY MORNING 30 MINUTES BEFORE BREAKFAST Patient taking differently: Take 40  mg by mouth daily.  04/28/20   Pyrtle, Lajuan Lines, MD  feeding supplement, GLUCERNA SHAKE, (GLUCERNA SHAKE) LIQD Take 237 mLs by mouth 3 (three) times daily with meals. 06/20/20 07/20/20  Antonieta Pert, MD  folic acid (FOLVITE) 1 MG tablet Take 1 tablet (1 mg total) by mouth daily. 06/21/20 07/21/20  Antonieta Pert, MD  glucagon (GLUCAGON EMERGENCY) 1 MG injection Inject 1 mg into the skin once as needed (For severe low blood sugar). 06/14/19   Elayne Snare, MD  insulin aspart (NOVOLOG FLEXPEN) 100 UNIT/ML FlexPen Inject 3-4 Units into the skin 3 (three) times daily with meals. Inject 3-4 units under the skin three times daily before meals.     [provider]  insulin degludec (TRESIBA FLEXTOUCH) 100 UNIT/ML FlexTouch Pen inject 7 units daily. if fasting blood sugars are over 140 increase dosage up to 15 units daily as directed. 06/20/20   Antonieta Pert, MD  loperamide (IMODIUM A-D) 2 MG tablet Take 2 mg by mouth 4 (four) times daily as needed for diarrhea or loose stools.    [provider]  metFORMIN (GLUCOPHAGE-XR) 500 MG 24 hr tablet Take 1 tablet (500 mg total) by mouth daily with supper. Patient not taking: Reported on 06/26/2020 09/16/19   Elayne Snare, MD  metroNIDAZOLE (FLAGYL) 250 MG tablet Take 1 tablet (250 mg total) by mouth 3 (three) times daily. 06/26/20   Elayne Snare, MD  [facility] VERIO test strip USE 1 STRIP TO CHECK GLUCOSE THREE TIMES DAILY 02/24/20   Elayne Snare, MD  oxyCODONE (OXY IR/ROXICODONE) 5 MG immediate release tablet Take 1 tablet (5 mg total) by mouth every 4 (four) hours as needed for severe pain. 06/23/20   Tanner, Lyndon Code., PA-C  oxymetazoline (AFRIN) 0.05 % nasal spray Place 1 spray into both nostrils 2 (two) times daily as needed for congestion.    [provider]  pioglitazone (ACTOS) 15 MG tablet Take 15 mg by mouth daily. 06/26/20   [provider]  potassium chloride SA (KLOR-CON)  20 MEQ tablet Take 1 tablet (20 mEq total) by mouth 2 (two) times daily. 05/12/20   Tanner, Lyndon Code., PA-C  prochlorperazine (COMPAZINE) 10 MG tablet Take 1 tablet (10 mg total) by mouth every 6 (six) hours as needed for nausea or vomiting. 03/28/20   Curt Bears, MD  rosuvastatin (CRESTOR) 5 MG tablet TAKE 1 TABLET BY MOUTH EVERY DAY 04/21/20   Elayne Snare, MD  sacubitril-valsartan (ENTRESTO) 24-26 MG Take 1 tablet by mouth 2 (two) times daily. 06/20/20 07/20/20  Antonieta Pert, MD  senna (SENOKOT) 8.6 MG TABS tablet Take 1 tablet (8.6 mg total) by mouth daily. Patient not taking: Reported on 06/27/2020 06/21/20   Antonieta Pert, MD  thiamine 100 MG tablet Take 1 tablet (100 mg total) by mouth daily. 06/21/20 07/21/20  Antonieta Pert, MD  traMADol (ULTRAM) 50 MG tablet Take 1 tablet (50 mg total) by mouth every 12 (twelve) hours as needed. 06/21/20   Curt Bears, MD    Current Facility-Administered Medications  Medication Dose Route Frequency Provider Last Rate Last Admin  . 0.9 %  sodium chloride infusion   Intravenous Continuous Domenic Moras, PA-C      . acetaminophen (TYLENOL) tablet 650 mg  650 mg Oral Q6H PRN Donne Hazel, MD       Or  . acetaminophen (TYLENOL) suppository 650 mg  650 mg Rectal Q6H PRN Donne Hazel, MD      . dextrose 5 %-0.45 % sodium chloride  infusion   Intravenous Continuous Domenic Moras, PA-C      . dextrose 50 % solution 0-50 mL  0-50 mL Intravenous PRN Domenic Moras, PA-C      . enoxaparin (LOVENOX) injection 40 mg  40 mg Subcutaneous Daily Donne Hazel, MD      . insulin regular, human (MYXREDLIN) 100 units/ 100 mL infusion   Intravenous Continuous Domenic Moras, PA-C 10 mL/hr at 06/28/20 1110 10 Units/hr at 06/28/20 1110  . potassium chloride 10 mEq in 100 mL IVPB  10 mEq Intravenous Q1H Donne Hazel, MD      . sodium chloride 0.9 % bolus 1,360 mL  20 mL/kg Intravenous Once Donne Hazel, MD       Current Outpatient Medications  Medication Sig Dispense Refill  .  acetaminophen (TYLENOL) 500 MG tablet Take 500 mg by mouth every 6 (six) hours as needed for moderate pain.    Marland Kitchen allopurinol (ZYLOPRIM) 100 MG tablet Take 1 tablet (100 mg total) by mouth 2 (two) times daily. 60 tablet 2  . ALPRAZolam (XANAX) 0.25 MG tablet Take 1 tablet (0.25 mg total) by mouth at bedtime as needed for anxiety. 30 tablet 0  . aspirin 81 MG EC tablet Take 81 mg by mouth daily. Swallow whole.    . benazepril (LOTENSIN) 20 MG tablet Take 20 mg by mouth daily.    . carvedilol (COREG) 12.5 MG tablet Take 1 tablet (12.5 mg total) by mouth 2 (two) times daily with a meal. 60 tablet 0  . diphenoxylate-atropine (LOMOTIL) 2.5-0.025 MG tablet 1 to 2 tablets PO QID prn diarrhea 45 tablet 1  . dronabinol (MARINOL) 2.5 MG capsule Take 1 capsule (2.5 mg total) by mouth 2 (two) times daily before a meal. 30 capsule 0  . esomeprazole (NEXIUM) 40 MG capsule TAKE 1 CAPSULE BY MOUTH EVERY MORNING 30 MINUTES BEFORE BREAKFAST (Patient taking differently: Take 40 mg by mouth daily. ) 90 capsule 0  . feeding supplement, GLUCERNA SHAKE, (GLUCERNA SHAKE) LIQD Take 237 mLs by mouth 3 (three) times daily with meals. 66599 mL 0  . folic acid (FOLVITE) 1 MG tablet Take 1 tablet (1 mg total) by mouth daily. 30 tablet 0  . glucagon (GLUCAGON EMERGENCY) 1 MG injection Inject 1 mg into the skin once as needed (For severe low blood sugar). 1 each 5  . insulin aspart (NOVOLOG FLEXPEN) 100 UNIT/ML FlexPen Inject 3-4 Units into the skin 3 (three) times daily with meals. Inject 3-4 units under the skin three times daily before meals.     . insulin degludec (TRESIBA FLEXTOUCH) 100 UNIT/ML FlexTouch Pen inject 7 units daily. if fasting blood sugars are over 140 increase dosage up to 15 units daily as directed. 4 pen 4  . loperamide (IMODIUM A-D) 2 MG tablet Take 2 mg by mouth 4 (four) times daily as needed for diarrhea or loose stools.    . metFORMIN (GLUCOPHAGE-XR) 500 MG 24 hr tablet Take 1 tablet (500 mg total) by mouth  daily with supper. (Patient not taking: Reported on 06/26/2020) 90 tablet 3  . metroNIDAZOLE (FLAGYL) 250 MG tablet Take 1 tablet (250 mg total) by mouth 3 (three) times daily. 21 tablet 0  . ONETOUCH VERIO test strip USE 1 STRIP TO CHECK GLUCOSE THREE TIMES DAILY 100 each 5  . oxyCODONE (OXY IR/ROXICODONE) 5 MG immediate release tablet Take 1 tablet (5 mg total) by mouth every 4 (four) hours as needed for severe pain. 45 tablet 0  .  oxymetazoline (AFRIN) 0.05 % nasal spray Place 1 spray into both nostrils 2 (two) times daily as needed for congestion.    . pioglitazone (ACTOS) 15 MG tablet Take 15 mg by mouth daily.    . potassium chloride SA (KLOR-CON) 20 MEQ tablet Take 1 tablet (20 mEq total) by mouth 2 (two) times daily. 14 tablet 0  . prochlorperazine (COMPAZINE) 10 MG tablet Take 1 tablet (10 mg total) by mouth every 6 (six) hours as needed for nausea or vomiting. 30 tablet 0  . rosuvastatin (CRESTOR) 5 MG tablet TAKE 1 TABLET BY MOUTH EVERY DAY 90 tablet 0  . sacubitril-valsartan (ENTRESTO) 24-26 MG Take 1 tablet by mouth 2 (two) times daily. 60 tablet 0  . senna (SENOKOT) 8.6 MG TABS tablet Take 1 tablet (8.6 mg total) by mouth daily. (Patient not taking: Reported on 06/27/2020) 120 tablet 0  . thiamine 100 MG tablet Take 1 tablet (100 mg total) by mouth daily. 30 tablet 0  . traMADol (ULTRAM) 50 MG tablet Take 1 tablet (50 mg total) by mouth every 12 (twelve) hours as needed. 30 tablet 0    Allergies as of 06/28/2020 - Review Complete 06/28/2020  Allergen Reaction Noted  . Glycopyrrolate Rash 10/25/2013    Family History  Problem Relation Age of Onset  . Kidney disease Mother   . CAD Maternal Aunt   . Colon cancer Neg Hx   . Esophageal cancer Neg Hx   . Rectal cancer Neg Hx   . Stomach cancer Neg Hx     Social History   Socioeconomic History  . Marital status: Married    Spouse name: Not on file  . Number of children: 1  . Years of education: Not on file  . Highest  education level: Not on file  Occupational History  . Occupation: Retail buyer: [employer]  Tobacco Use  . Smoking status: Never Smoker  . Smokeless tobacco: Former Systems developer    Types: Secondary school teacher  . Vaping Use: Never used  Substance and Sexual Activity  . Alcohol use: Yes    Alcohol/week: 6.0 standard drinks    Types: 6 Cans of beer per week  . Drug use: No  . Sexual activity: Yes  Other Topics Concern  . Not on file  Social History Narrative  . Not on file   Social Determinants of Health   Financial Resource Strain:   . Difficulty of Paying Living Expenses:   Food Insecurity:   . Worried About Charity fundraiser in the Last Year:   . Arboriculturist in the Last Year:   Transportation Needs:   . Film/video editor (Medical):   Marland Kitchen Lack of Transportation (Non-Medical):   Physical Activity:   . Days of Exercise per Week:   . Minutes of Exercise per Session:   Stress:   . Feeling of Stress :   Social Connections:   . Frequency of Communication with Friends and Family:   . Frequency of Social Gatherings with Friends and Family:   . Attends Religious Services:   . Active Member of Clubs or Organizations:   . Attends Archivist Meetings:   Marland Kitchen Marital Status:   Intimate Partner Violence:   . Fear of Current or Ex-Partner:   . Emotionally Abused:   Marland Kitchen Physically Abused:   . Sexually Abused:     Review of Systems: All systems reviewed and negative except where noted in HPI.  Physical Exam: Vital signs in last 24 hours: Temp:  [98 F (36.7 C)] 98 F (36.7 C) (07/14 0635) Pulse Rate:  [83-84] 84 (07/14 0936) Resp:  [15-17] 15 (07/14 0936) BP: (121-145)/(69-78) 145/78 (07/14 0936) SpO2:  [99 %-100 %] 99 % (07/14 0936) Weight:  [68 kg] 68 kg (07/14 2595)   General:   Alert, thin male in NAD Psych:  Pleasant, cooperative. Normal mood and affect. Eyes:  Pupils equal, sclera clear, no icterus.   Conjunctiva pink. Ears:  Normal  auditory acuity. Nose:  No deformity, discharge,  or lesions. Neck:  Supple; no masses Lungs:  Clear throughout to auscultation.   No wheezes, crackles, or rhonchi.  Heart:  Regular rate and rhythm; no lower extremity edema Abdomen:  Soft, non-distended, nontender, BS active, no palp mass   Rectal:  Large stool ball in vault. Stool brown with blood mixed in.   Msk:  Symmetrical without gross deformities. . Neurologic:  Alert and  oriented x4;  grossly normal neurologically. Skin:  Intact without significant lesions or rashes.   Intake/Output from previous day: No intake/output data recorded. Intake/Output this shift: Total I/O In: 1000 [IV Piggyback:1000] Out: -   Lab Results: Recent Labs    06/27/20 1010 06/28/20 0722  WBC 5.8 5.6  HGB 8.8* 8.6*  HCT 26.2* 26.4*  PLT 183 178   BMET Recent Labs    06/27/20 1010 06/28/20 0722  NA 138 137  K 3.2* 3.2*  CL 103 100  CO2 22 18*  GLUCOSE 123* 252*  BUN 12 12  CREATININE 0.74 0.70  CALCIUM 8.3* 8.2*   LFT Recent Labs    06/28/20 0722  PROT 4.3*  ALBUMIN 2.2*  AST 13*  ALT 10  ALKPHOS 63  BILITOT 1.6*   PT/INR Recent Labs    06/28/20 0722  LABPROT 14.2  INR 1.1   Hepatitis Panel No results for input(s): HEPBSAG, HCVAB, HEPAIGM, HEPBIGM in the last 72 hours.   . CBC Latest Ref Rng & Units 06/28/2020 06/27/2020 06/23/2020  WBC 4.0 - 10.5 K/uL 5.6 5.8 3.3(L)  Hemoglobin 13.0 - 17.0 g/dL 8.6(L) 8.8(L) 8.0(L)  Hematocrit 39 - 52 % 26.4(L) 26.2(L) 24.4(L)  Platelets 150 - 400 K/uL 178 183 110(L)    . CMP Latest Ref Rng & Units 06/28/2020 06/27/2020 06/23/2020  Glucose 70 - 99 mg/dL 252(H) 123(H) 291(H)  BUN 8 - 23 mg/dL 12 12 12   Creatinine 0.61 - 1.24 mg/dL 0.70 0.74 0.71  Sodium 135 - 145 mmol/L 137 138 138  Potassium 3.5 - 5.1 mmol/L 3.2(L) 3.2(L) 3.5  Chloride 98 - 111 mmol/L 100 103 100  CO2 22 - 32 mmol/L 18(L) 22 24  Calcium 8.9 - 10.3 mg/dL 8.2(L) 8.3(L) 7.9(L)  Total Protein 6.5 - 8.1 g/dL 4.3(L)  4.7(L) 4.5(L)  Total Bilirubin 0.3 - 1.2 mg/dL 1.6(H) 0.9 0.7  Alkaline Phos 38 - 126 U/L 63 74 79  AST 15 - 41 U/L 13(L) 13(L) 13(L)  ALT 0 - 44 U/L 10 7 <6   Studies/Results: DG Lumbar Spine 2-3 Views  Result Date: 06/27/2020 CLINICAL DATA:  Low back pain.  No known injury. EXAM: LUMBAR SPINE - 2-3 VIEW COMPARISON:  CT chest, abdomen and pelvis 06/07/2020. FINDINGS: Vertebral body height and alignment are maintained. Intervertebral disc space height is normal. Paraspinous structures demonstrate atherosclerosis. IMPRESSION: Normal appearing lumbar spine. Aortic Atherosclerosis (ICD10-I70.0). Electronically Signed   By: Inge Rise M.D.   On: 06/27/2020 11:01   VAS [place] LOWER EXTREMITY  VENOUS (DVT) (ONLY [facility] & [facility] 7a-7p)  Result Date: 06/28/2020  Lower Venous DVTStudy Indications: Edema.  Comparison       Piror studies RT LEV 06-11-2020 and 06-07-2020. Negative for Study:           DVT. Performing Technologist: Darlin Coco  Examination Guidelines: A complete evaluation includes B-mode imaging, spectral Doppler, color Doppler, and power Doppler as needed of all accessible portions of each vessel. Bilateral testing is considered an integral part of a complete examination. Limited examinations for reoccurring indications may be performed as noted. The reflux portion of the exam is performed with the patient in reverse Trendelenburg.  +---------+---------------+---------+-----------+----------+--------------+ RIGHT    CompressibilityPhasicitySpontaneityPropertiesThrombus Aging +---------+---------------+---------+-----------+----------+--------------+ CFV      Full           Yes      Yes                                 +---------+---------------+---------+-----------+----------+--------------+ SFJ      Full                                                        +---------+---------------+---------+-----------+----------+--------------+ FV Prox  Full                                                         +---------+---------------+---------+-----------+----------+--------------+ FV Mid   Full                                                        +---------+---------------+---------+-----------+----------+--------------+ FV DistalFull                                                        +---------+---------------+---------+-----------+----------+--------------+ PFV      Full                                                        +---------+---------------+---------+-----------+----------+--------------+ POP      Full           Yes      Yes                                 +---------+---------------+---------+-----------+----------+--------------+ PTV      Full                                                        +---------+---------------+---------+-----------+----------+--------------+  PERO     Full                                                        +---------+---------------+---------+-----------+----------+--------------+   +----+---------------+---------+-----------+----------+--------------+ LEFTCompressibilityPhasicitySpontaneityPropertiesThrombus Aging +----+---------------+---------+-----------+----------+--------------+ CFV Full           Yes      Yes                                 +----+---------------+---------+-----------+----------+--------------+     Summary: RIGHT: - There is no evidence of deep vein thrombosis in the lower extremity.  - No cystic structure found in the popliteal fossa.  LEFT: - No evidence of common femoral vein obstruction.  *See table(s) above for measurements and observations.    Preliminary     Active Problems:   Essential hypertension   Pure hypercholesterolemia   CLL (chronic lymphocytic leukemia) ([facility])   Increased anion gap metabolic acidosis   Symptomatic anemia   Hyperglycemia due to diabetes mellitus ([facility])   DKA (diabetic ketoacidoses) ([facility])   Systolic and diastolic CHF,  chronic ([place])    Tye Savoy, NP-C @  06/28/2020, 11:35 AM

## 2020-06-29 ENCOUNTER — Ambulatory Visit: Payer: Medicare HMO | Admitting: Physician Assistant

## 2020-06-29 DIAGNOSIS — F0281 Dementia in other diseases classified elsewhere with behavioral disturbance: Secondary | ICD-10-CM

## 2020-06-29 DIAGNOSIS — E7800 Pure hypercholesterolemia, unspecified: Secondary | ICD-10-CM

## 2020-06-29 DIAGNOSIS — G301 Alzheimer's disease with late onset: Secondary | ICD-10-CM

## 2020-06-29 DIAGNOSIS — E1165 Type 2 diabetes mellitus with hyperglycemia: Secondary | ICD-10-CM | POA: Diagnosis not present

## 2020-06-29 DIAGNOSIS — E1110 Type 2 diabetes mellitus with ketoacidosis without coma: Secondary | ICD-10-CM | POA: Diagnosis not present

## 2020-06-29 DIAGNOSIS — R410 Disorientation, unspecified: Secondary | ICD-10-CM

## 2020-06-29 DIAGNOSIS — I10 Essential (primary) hypertension: Secondary | ICD-10-CM | POA: Diagnosis not present

## 2020-06-29 DIAGNOSIS — C9110 Chronic lymphocytic leukemia of B-cell type not having achieved remission: Secondary | ICD-10-CM

## 2020-06-29 DIAGNOSIS — R197 Diarrhea, unspecified: Secondary | ICD-10-CM

## 2020-06-29 DIAGNOSIS — E872 Acidosis: Secondary | ICD-10-CM

## 2020-06-29 DIAGNOSIS — K625 Hemorrhage of anus and rectum: Secondary | ICD-10-CM

## 2020-06-29 DIAGNOSIS — K5641 Fecal impaction: Secondary | ICD-10-CM

## 2020-06-29 LAB — COMPREHENSIVE METABOLIC PANEL
ALT: 10 U/L (ref 0–44)
AST: 17 U/L (ref 15–41)
Albumin: 2.6 g/dL — ABNORMAL LOW (ref 3.5–5.0)
Alkaline Phosphatase: 73 U/L (ref 38–126)
Anion gap: 11 (ref 5–15)
BUN: 10 mg/dL (ref 8–23)
CO2: 23 mmol/L (ref 22–32)
Calcium: 8 mg/dL — ABNORMAL LOW (ref 8.9–10.3)
Chloride: 102 mmol/L (ref 98–111)
Creatinine, Ser: 0.47 mg/dL — ABNORMAL LOW (ref 0.61–1.24)
GFR calc Af Amer: >60 mL/min (ref >60)
GFR calc non Af Amer: >60 mL/min (ref >60)
Glucose, Bld: 74 mg/dL (ref 70–99)
Potassium: 3.2 mmol/L — ABNORMAL LOW (ref 3.5–5.1)
Sodium: 136 mmol/L (ref 135–145)
Total Bilirubin: 1.5 mg/dL — ABNORMAL HIGH (ref 0.3–1.2)
Total Protein: 5 g/dL — ABNORMAL LOW (ref 6.5–8.1)

## 2020-06-29 LAB — GLUCOSE, CAPILLARY
Glucose-Capillary: 122 mg/dL — ABNORMAL HIGH (ref 70–99)
Glucose-Capillary: 72 mg/dL (ref 70–99)
Glucose-Capillary: 77 mg/dL (ref 70–99)
Glucose-Capillary: 158 mg/dL — ABNORMAL HIGH (ref 70–99)
Glucose-Capillary: 184 mg/dL — ABNORMAL HIGH (ref 70–99)

## 2020-06-29 LAB — MAGNESIUM: Magnesium: 2 mg/dL (ref 1.7–2.4)

## 2020-06-29 LAB — CBC
HCT: 29.1 % — ABNORMAL LOW (ref 39.0–52.0)
HCT: 25.4 % — ABNORMAL LOW (ref 39.0–52.0)
Hemoglobin: 9.9 g/dL — ABNORMAL LOW (ref 13.0–17.0)
Hemoglobin: 8.4 g/dL — ABNORMAL LOW (ref 13.0–17.0)
MCH: 32 pg (ref 26.0–34.0)
MCH: 31.6 pg (ref 26.0–34.0)
MCHC: 34 g/dL (ref 30.0–36.0)
MCHC: 33.1 g/dL (ref 30.0–36.0)
MCV: 94.2 fL (ref 80.0–100.0)
MCV: 95.5 fL (ref 80.0–100.0)
Platelets: 235 10*3/uL (ref 150–400)
Platelets: 192 10*3/uL (ref 150–400)
RBC: 3.09 MIL/uL — ABNORMAL LOW (ref 4.22–5.81)
RBC: 2.66 MIL/uL — ABNORMAL LOW (ref 4.22–5.81)
RDW: 18.5 % — ABNORMAL HIGH (ref 11.5–15.5)
RDW: 18.5 % — ABNORMAL HIGH (ref 11.5–15.5)
WBC: 8.1 10*3/uL (ref 4.0–10.5)
WBC: 5.6 10*3/uL (ref 4.0–10.5)
nRBC: 0 % (ref 0.0–0.2)
nRBC: 0 % (ref 0.0–0.2)

## 2020-06-29 LAB — BETA-HYDROXYBUTYRIC ACID: Beta-Hydroxybutyric Acid: 1.71 mmol/L — ABNORMAL HIGH (ref 0.05–0.27)

## 2020-06-29 MED ORDER — SODIUM CHLORIDE 0.9 % IV SOLN
25.0000 mg | Freq: Once | INTRAVENOUS | Status: AC
  Administered 2020-06-29: 25 mg via INTRAVENOUS
  Filled 2020-06-29: qty 25

## 2020-06-29 MED ORDER — CALCIUM GLUCONATE-NACL 1-0.675 GM/50ML-% IV SOLN: 1.0000 g | Freq: Once | INTRAVENOUS | Status: DC

## 2020-06-29 MED ORDER — ADULT MULTIVITAMIN W/MINERALS CH
1.0000 | ORAL_TABLET | Freq: Every day | ORAL | Status: DC
  Administered 2020-06-29 – 2020-07-11 (×13): 1 via ORAL
  Filled 2020-06-29 (×14): qty 1

## 2020-06-29 MED ORDER — ALPRAZOLAM 0.25 MG PO TABS
0.2500 mg | ORAL_TABLET | Freq: Every evening | ORAL | Status: DC | PRN
  Administered 2020-06-29: 0.25 mg via ORAL
  Filled 2020-06-29: qty 1

## 2020-06-29 MED ORDER — INSULIN GLARGINE 100 UNIT/ML ~~LOC~~ SOLN
4.0000 [IU] | Freq: Every day | SUBCUTANEOUS | Status: DC
  Administered 2020-06-29: 4 [IU] via SUBCUTANEOUS
  Filled 2020-06-29 (×2): qty 0.04

## 2020-06-29 MED ORDER — CARVEDILOL 12.5 MG PO TABS
12.5000 mg | ORAL_TABLET | Freq: Two times a day (BID) | ORAL | Status: DC
  Administered 2020-06-29: 12.5 mg via ORAL
  Filled 2020-06-29: qty 1

## 2020-06-29 MED ORDER — DIVALPROEX SODIUM 125 MG PO DR TAB
125.0000 mg | DELAYED_RELEASE_TABLET | Freq: Two times a day (BID) | ORAL | Status: DC
  Administered 2020-06-29 – 2020-07-11 (×24): 125 mg via ORAL
  Filled 2020-06-29 (×24): qty 1

## 2020-06-29 MED ORDER — DIPHENHYDRAMINE HCL 50 MG/ML IJ SOLN
12.5000 mg | Freq: Four times a day (QID) | INTRAMUSCULAR | Status: DC | PRN
  Administered 2020-06-30: 25 mg via INTRAVENOUS
  Filled 2020-06-29: qty 1

## 2020-06-29 MED ORDER — CARVEDILOL 25 MG PO TABS
25.0000 mg | ORAL_TABLET | Freq: Two times a day (BID) | ORAL | Status: DC
  Administered 2020-06-29 – 2020-06-30 (×2): 25 mg via ORAL
  Filled 2020-06-29: qty 1
  Filled 2020-06-29 (×2): qty 2

## 2020-06-29 MED ORDER — POTASSIUM CHLORIDE 10 MEQ/100ML IV SOLN
10.0000 meq | INTRAVENOUS | Status: AC
  Administered 2020-06-29 (×2): 10 meq via INTRAVENOUS
  Filled 2020-06-29: qty 100

## 2020-06-29 MED ORDER — POTASSIUM CHLORIDE CRYS ER 20 MEQ PO TBCR
30.0000 meq | EXTENDED_RELEASE_TABLET | Freq: Once | ORAL | Status: AC
  Administered 2020-06-29: 30 meq via ORAL
  Filled 2020-06-29: qty 1

## 2020-06-29 MED ORDER — DIPHENHYDRAMINE HCL 50 MG/ML IJ SOLN
25.0000 mg | Freq: Once | INTRAMUSCULAR | Status: AC
  Administered 2020-06-29: 25 mg via INTRAVENOUS
  Filled 2020-06-29: qty 1

## 2020-06-29 MED ORDER — CALCIUM CARBONATE ANTACID 500 MG PO CHEW
1.0000 | CHEWABLE_TABLET | Freq: Once | ORAL | Status: AC
  Administered 2020-06-29: 200 mg via ORAL
  Filled 2020-06-29: qty 1

## 2020-06-29 MED ORDER — HYDROXYZINE HCL 10 MG PO TABS: 20.0000 mg | ORAL_TABLET | Freq: Three times a day (TID) | ORAL | Status: DC | PRN

## 2020-06-29 MED ORDER — KATE FARMS STANDARD 1.4 PO LIQD
325.0000 mL | Freq: Two times a day (BID) | ORAL | Status: DC
  Administered 2020-06-30 – 2020-07-04 (×4): 325 mL via ORAL
  Filled 2020-06-29 (×18): qty 325

## 2020-06-29 MED ORDER — HYDRALAZINE HCL 20 MG/ML IJ SOLN
10.0000 mg | INTRAMUSCULAR | Status: DC | PRN
  Administered 2020-06-29: 10 mg via INTRAVENOUS
  Filled 2020-06-29: qty 1

## 2020-06-29 MED ORDER — BISACODYL 5 MG PO TBEC
10.0000 mg | DELAYED_RELEASE_TABLET | Freq: Once | ORAL | Status: AC
  Administered 2020-06-29: 10 mg via ORAL
  Filled 2020-06-29: qty 2

## 2020-06-29 NOTE — Progress Notes (Signed)
Paged on call provider to inform of below:   Results for MARIN, MILLEY (MRN [id number]) as of 06/29/2020 04:10  Ref. Range 06/29/2020 02:47  COMPREHENSIVE METABOLIC PANEL Unknown Rpt (A)  Sodium Latest Ref Range: 135 - 145 mmol/L 136  Potassium Latest Ref Range: 3.5 - 5.1 mmol/L 3.2 (L)  Chloride Latest Ref Range: 98 - 111 mmol/L 102  CO2 Latest Ref Range: 22 - 32 mmol/L 23  Glucose Latest Ref Range: 70 - 99 mg/dL 74  BUN Latest Ref Range: 8 - 23 mg/dL 10  Creatinine Latest Ref Range: 0.61 - 1.24 mg/dL 0.47 (L)  Calcium Latest Ref Range: 8.9 - 10.3 mg/dL 8.0 (L)  Anion gap Latest Ref Range: 5 - 15  11  Magnesium Latest Ref Range: 1.7 - 2.4 mg/dL 2.0  Alkaline Phosphatase Latest Ref Range: 38 - 126 U/L 73  Albumin Latest Ref Range: 3.5 - 5.0 g/dL 2.6 (L)  AST Latest Ref Range: 15 - 41 U/L 17  ALT Latest Ref Range: 0 - 44 U/L 10  Total Protein Latest Ref Range: 6.5 - 8.1 g/dL 5.0 (L)  Total Bilirubin Latest Ref Range: 0.3 - 1.2 mg/dL 1.5 (H)  GFR, Est Non African American Latest Ref Range: >60 mL/min >60  GFR, Est African American Latest Ref Range: >60 mL/min >60  WBC Latest Ref Range: 4.0 - 10.5 K/uL 8.1  RBC Latest Ref Range: 4.22 - 5.81 MIL/uL 3.09 (L)  Hemoglobin Latest Ref Range: 13.0 - 17.0 g/dL 9.9 (L)  HCT Latest Ref Range: 39 - 52 % 29.1 (L)  MCV Latest Ref Range: 80.0 - 100.0 fL 94.2  MCH Latest Ref Range: 26.0 - 34.0 pg 32.0  MCHC Latest Ref Range: 30.0 - 36.0 g/dL 34.0  RDW Latest Ref Range: 11.5 - 15.5 % 18.5 (H)  Platelets Latest Ref Range: 150 - 400 K/uL 235  nRBC Latest Ref Range: 0.0 - 0.2 % 0.0  Beta-Hydroxybutyric Acid Latest Ref Range: 0.05 - 0.27 mmol/L 1.71 (H)

## 2020-06-29 NOTE — Progress Notes (Addendum)
PROGRESS NOTE    Tony Marshall  ATF:[id number]  DOB: [birth year]  PCP: Elayne Snare, MD Admit date:06/28/2020 Chief compliant: diarrhea 79 y.o. male with medical history significant of CLL, DM, HTN, HLD, combined systolic/diastolic CHF presents with complaints of diarrhea which started suddenly on 7/13. Noted to be maroon in color with blood clots. Pt's wife arranged outpt visit with primary GI provider, Dr Hilarie Fredrickson, for 7/15, however, symptoms worsened and pt subsequently was brought to ED for further work up. Patient was recently discharged on 7/6 after treatment of septic shock from LE cellulitis. Pt completed course of Keflex.  ED Course: Afebrile,found to have glucose of 252 with an anion gap of 19. Pt was started on IVF boluses with basal IVF hydration with insulin gtt. Pt was noted to be heme pos with hgb of 8.6. GI consulted by EDP. Hospital course: Patient admitted to [facility] and GI recommended laxatives for fecal impaction possibly causing stercoral colitis. Patient been very delirious overnight and has sitter now.     Subjective:  Several loose BMs this morning. Delirious and been Artist.  Apparently very confused even at baseline per bedside nurse who has taken care of him in the past.  Still has impacted stool on digital rectal exam by nurse this morning.  No further GI bleeding.  Objective: Vitals:   06/29/20 0516 06/29/20 0600 06/29/20 0630 06/29/20 0700  BP: (!) 157/85 (!) 181/91    Pulse: (!) 104 (!) 117 (!) 105 100  Resp: 19 (!) 21 20 15   Temp:      TempSrc:      SpO2: 100% 100% 100% 99%  Weight:      Height:        Intake/Output Summary (Last 24 hours) at 06/29/2020 0809 Last data filed at 06/29/2020 2706 Gross per 24 hour  Intake 4613.79 ml  Output 100 ml  Net 4513.79 ml   Filed Weights   06/28/20 0635 06/28/20 2100  Weight: 68 kg 57.3 kg    Physical Examination: General: Thin built male with advanced dementia, in moderate distress due to delirium    Heart: S1-S2 heard, regular rate and rhythm, no murmurs.  No leg edema noted Lungs: Equal air entry bilaterally, no rhonchi or rales on exam, no accessory muscle use Abdomen: Bowel sounds heard, nondistended, soft on exam but could not examine further Extremities: No pedal edema.  No cyanosis or clubbing. Neurological: Confused and agitated, noted to be moving all extremities, could not test further  skin: No wounds or rashes.   Data Reviewed: I have personally reviewed following labs and imaging studies  CBC: Recent Labs  Lab 06/23/20 1226 06/23/20 1226 06/27/20 1010 06/28/20 0722 06/28/20 1325 06/28/20 2107 06/29/20 0247  WBC 3.3*   < > 5.8 5.6 5.3 5.9 8.1  NEUTROABS 2.5  --  3.9 3.8  --   --   --   HGB 8.0*   < > 8.8* 8.6* 8.5* 8.6* 9.9*  HCT 24.4*   < > 26.2* 26.4* 26.3* 26.6* 29.1*  MCV 95.3   < > 92.3 97.4 96.0 97.1 94.2  PLT 110*   < > 183 178 194 193 235   < > = values in this interval not displayed.   Basic Metabolic Panel: Recent Labs  Lab 06/23/20 1226 06/23/20 1343 06/27/20 1010 06/28/20 0722 06/28/20 1325 06/28/20 2107 06/29/20 0247  NA   < >  --  138 137 140 138 136  K   < >  --  3.2* 3.2* 2.5* 3.3* 3.2*  CL   < >  --  103 100 105 104 102  CO2   < >  --  22 18* 23 20* 23  GLUCOSE   < >  --  123* 252* 83 109* 74  BUN   < >  --  12 12 11 11 10   CREATININE   < >  --  0.74 0.70 0.55* 0.60* 0.47*  CALCIUM   < >  --  8.3* 8.2* 7.9* 8.0* 8.0*  MG  --  1.3* 1.3*  --   --   --  2.0   < > = values in this interval not displayed.   GFR: Estimated Creatinine Clearance: 63.7 mL/min (A) (by C-G formula based on SCr of 0.47 mg/dL (L)). Liver Function Tests: Recent Labs  Lab 06/23/20 1226 06/27/20 1010 06/28/20 0722 06/29/20 0247  AST 13* 13* 13* 17  ALT 6 7 10 10   ALKPHOS 79 74 63 73  BILITOT 0.7 0.9 1.6* 1.5*  PROT 4.5* 4.7* 4.3* 5.0*  ALBUMIN 2.2* 2.3* 2.2* 2.6*   No results for input(s): LIPASE, AMYLASE in the last 168 hours. No results for  input(s): AMMONIA in the last 168 hours. Coagulation Profile: Recent Labs  Lab 06/28/20 0722  INR 1.1   Cardiac Enzymes: No results for input(s): CKTOTAL, CKMB, CKMBINDEX, TROPONINI in the last 168 hours. BNP (last 3 results) No results for input(s): PROBNP in the last 8760 hours. HbA1C: No results for input(s): HGBA1C in the last 72 hours. CBG: Recent Labs  Lab 06/28/20 1818 06/28/20 1915 06/28/20 2016 06/28/20 2106 06/29/20 0751  GLUCAP 127* 141* 137* 94 122*   Lipid Profile: No results for input(s): CHOL, HDL, LDLCALC, TRIG, CHOLHDL, LDLDIRECT in the last 72 hours. Thyroid Function Tests: No results for input(s): TSH, T4TOTAL, FREET4, T3FREE, THYROIDAB in the last 72 hours. Anemia Panel: No results for input(s): VITAMINB12, FOLATE, FERRITIN, TIBC, IRON, RETICCTPCT in the last 72 hours. Sepsis Labs: No results for input(s): PROCALCITON, LATICACIDVEN in the last 168 hours.  Recent Results (from the past 240 hour(s))  SARS Coronavirus 2 by RT PCR (hospital order, performed in [facility] hospital lab) Nasopharyngeal Nasopharyngeal Swab     Status: None   Collection Time: 06/28/20  1:38 PM   Specimen: Nasopharyngeal Swab  Result Value Ref Range Status   SARS Coronavirus 2 NEGATIVE NEGATIVE Final    Comment: (NOTE) SARS-CoV-2 target nucleic acids are NOT DETECTED.  The SARS-CoV-2 RNA is generally detectable in upper and lower respiratory specimens during the acute phase of infection. The lowest concentration of SARS-CoV-2 viral copies this assay can detect is 250 copies / mL. A negative result does not preclude SARS-CoV-2 infection and should not be used as the sole basis for treatment or other patient management decisions.  A negative result may occur with improper specimen collection / handling, submission of specimen other than nasopharyngeal swab, presence of viral mutation(s) within the areas targeted by this assay, and inadequate number of viral copies (<250  copies / mL). A negative result must be combined with clinical observations, patient history, and epidemiological information.  Fact Sheet for Patients:   StrictlyIdeas.no  Fact Sheet for Healthcare Providers: BankingDealers.co.za  This test is not yet approved or  cleared by the [place] FDA and has been authorized for detection and/or diagnosis of SARS-CoV-2 by FDA under an Emergency Use Authorization (EUA).  This EUA will remain in effect (meaning this test can be used) for the  duration of the COVID-19 declaration under Section 564(b)(1) of the Act, 21 U.S.C. section 360bbb-3(b)(1), unless the authorization is terminated or revoked sooner.  Performed at [facility], [place] [address], [place], [place] [address]   MRSA PCR Screening     Status: None   Collection Time: 06/28/20  8:54 PM   Specimen: Nasal Mucosa; Nasopharyngeal  Result Value Ref Range Status   MRSA by PCR NEGATIVE NEGATIVE Final    Comment:        The GeneXpert MRSA Assay (FDA approved for NASAL specimens only), is one component of a comprehensive MRSA colonization surveillance program. It is not intended to diagnose MRSA infection nor to guide or monitor treatment for MRSA infections. Performed at [facility], [place] [address] [place], [place] [address]       Radiology Studies: VAS [place] LOWER EXTREMITY VENOUS (DVT) (ONLY [facility] & [facility] 7a-7p)  Result Date: 06/28/2020  Lower Venous DVTStudy Indications: Edema.  Comparison       Piror studies RT LEV 06-11-2020 and 06-07-2020. Negative for Study:           DVT. Performing Technologist: Darlin Coco  Examination Guidelines: A complete evaluation includes B-mode imaging, spectral Doppler, color Doppler, and power Doppler as needed of all accessible portions of each vessel. Bilateral testing is considered an integral part of a complete examination. Limited examinations for reoccurring  indications may be performed as noted. The reflux portion of the exam is performed with the patient in reverse Trendelenburg.  +---------+---------------+---------+-----------+----------+--------------+ RIGHT    CompressibilityPhasicitySpontaneityPropertiesThrombus Aging +---------+---------------+---------+-----------+----------+--------------+ CFV      Full           Yes      Yes                                 +---------+---------------+---------+-----------+----------+--------------+ SFJ      Full                                                        +---------+---------------+---------+-----------+----------+--------------+ FV Prox  Full                                                        +---------+---------------+---------+-----------+----------+--------------+ FV Mid   Full                                                        +---------+---------------+---------+-----------+----------+--------------+ FV DistalFull                                                        +---------+---------------+---------+-----------+----------+--------------+ PFV      Full                                                        +---------+---------------+---------+-----------+----------+--------------+  POP      Full           Yes      Yes                                 +---------+---------------+---------+-----------+----------+--------------+ PTV      Full                                                        +---------+---------------+---------+-----------+----------+--------------+ PERO     Full                                                        +---------+---------------+---------+-----------+----------+--------------+   +----+---------------+---------+-----------+----------+--------------+ LEFTCompressibilityPhasicitySpontaneityPropertiesThrombus Aging +----+---------------+---------+-----------+----------+--------------+ CFV Full            Yes      Yes                                 +----+---------------+---------+-----------+----------+--------------+     Summary: RIGHT: - There is no evidence of deep vein thrombosis in the lower extremity.  - No cystic structure found in the popliteal fossa.  LEFT: - No evidence of common femoral vein obstruction.  *See table(s) above for measurements and observations. Electronically signed by Curt Jews MD on 06/28/2020 at 7:40:49 PM.    Final       Scheduled Meds: . bisacodyl  10 mg Rectal Daily  . carvedilol  12.5 mg Oral BID WC  . chlorhexidine  15 mL Mouth Rinse BID  . Chlorhexidine Gluconate Cloth  6 each Topical Daily  . insulin aspart  0-5 Units Subcutaneous QHS  . insulin aspart  0-9 Units Subcutaneous TID WC  . insulin glargine  5 Units Subcutaneous QHS  . mouth rinse  15 mL Mouth Rinse q12n4p  . polyethylene glycol  17 g Oral BID  . rosuvastatin  5 mg Oral Daily  . sacubitril-valsartan  1 tablet Oral BID   Continuous Infusions: . sodium chloride Stopped (06/28/20 1610)  . dextrose 5 % and 0.45% NaCl 75 mL/hr at 06/29/20 0629  . insulin Stopped (06/28/20 2022)  . sodium chloride Stopped (06/28/20 1611)     Assessment/Plan:  1. DKA : POA. Started on insulin drip on admission. Bicarb now at 23. AG closed. Transitioned to Lantus insulin 5 units with SSI. Titrate as oral intake improves.  Patient noted to be on Actos, Premeal insulin and Tresiba 7 units at home.  Most recent hemoglobin A1c in June was 7.7. Will continue dextrose fluids in concern for poor oral intake and low BG levels, reduce lantus to 4 units. Await Diab coordinator recommendations  2. Lower GI bleed with acute blood loss anemia: Had hematochezia in ED. Seen by GI who feel (based on recent imaging/rectal exam) he has fecal impaction with stercoral ulcer/hemorrhoids. Recommended aggressive bowel regimen. May need endoscopy if doesn't clear.  Patient unable to take large volume laxatives like mag  citrate by mouth per nurse, will take pills.  Can try enema once mental status improves.  3. Combined systolic/diastolic CHF:  Appears compensated. EF of 25-30% noted on recent echo. On IVF overnight for volume loss from above.  Noted to be on Coreg and Entresto at home (?  Also on Benzapril).  Blood pressure elevated today in the setting of agitation.  Continue hydralazine, can increase beta-blocker as also tachycardic.  4. CLL: Followed by oncology as outpatient. CBC trends appear stable, reviewed  5. Delirium : Standard delirium precautions, sitter. Reluctant to utilize antipsychotics given prolonged Qtc on last EKG (514 ms) . Repeat today when calm more cooperative.  Had suboptimal improvement with IV Benadryl this morning.  Will repeat another dose and have hydroxyzine p.o. available for as needed use. Addendum 4 pm : improved with IV benadryl , will have prn available instead of hydroxyzine. Add depakote for mood stabilization. Family also concerned regarding him hallucinating but understand limited options with prolonged qtc. Recommended to avoid benzos. D/w psychiatry who will see patient in am , agreed with Depakote for today   6. Hypokalemia/hypomagnesemia: still low, replace as needed and repeat labs  7. Recent cellulitis with sepsis: now resolved, completed antibiotics.   8. HTN/hyperlipidemia: resumed home meds  DVT prophylaxis: TED hose Code Status: Full code per record Family / Patient Communication: Patient confused, d/w family (wife and daughter) at bedside and explained plan. Daughter quiet emotional but wife appears to understand and appreciated care provided. Disposition Plan:   Status is: Inpatient  Remains inpatient appropriate because:IV treatments appropriate due to intensity of illness or inability to take PO   Dispo: The patient is from: Home              Anticipated d/c is to: To be decided              Anticipated d/c date is: 3 days              Patient  currently is not medically stable to d/c.         Time spent: 35 minutes     >50% time spent in discussions with care team and coordination of care.    Guilford Shi, MD [facility] Pager in [place]  If 7PM-7AM, please contact night-coverage [email or web address] 06/29/2020, 8:09 AM

## 2020-06-29 NOTE — Progress Notes (Addendum)
Initial Nutrition Assessment  DOCUMENTATION CODES:    (unable to assess for malnutrition at this time.)  INTERVENTION:  - will order [employer] BID, each supplement provides 455 kcal and 20 grams protein. - will order 1 tablet multivitamin with minerals/day. - will complete NFPE at follow-up. - recommend adding a probiotic.   NUTRITION DIAGNOSIS:   Increased nutrient needs related to acute illness, chronic illness, cancer and cancer related treatments as evidenced by estimated needs.  GOAL:   Patient will meet greater than or equal to 90% of their needs  MONITOR:   PO intake, Supplement acceptance, Labs, Weight trends  REASON FOR ASSESSMENT:   Malnutrition Screening Tool, Consult Assessment of nutrition requirement/status  ASSESSMENT:   79 y.o. male with medical history of CLL, DM, HTN, HLD, and CHF. He presented to the ED with complaints of diarrhea which started suddenly on 7/13. Noted to be maroon in color with blood clots. Patient was recently discharged on 7/6 after treatment of septic shock from BLE cellulitis.  Patient is noted to be a/o to self. He is known to this RD from recent admission; seen on 7/1. At that time he met criteria for moderate malnutrition related to chronic illness, cancer and cancer related treatments as evidenced by mild fat depletion, moderate fat depletion, moderate muscle depletion. Suspect this is ongoing.  Patient was seen by [facility] on 7/13 at which time patient reported he has no appetite, that he is trying to drink fluid and that he was having diarrhea/liquid stools up to 10x/day. Recommendations were made by RD at that time to help with this.   Weight yesterday was 126 lb which is consistent with/slightly above weight on 05/30/20.   Per notes: - DKA on admission--A1c in June was 7.7% - LGIB with acute blood loss anemia - CLL - recent cellulitis which is now resolved   Labs reviewed; CBGs: 122, 72, and 77 mg/dl, K: 3.2 mmol/l,  creatinine: 0.47 mg/dl, Ca: 8 mg/dl. Medications reviewed; 10 mg rectal dulcolax/day, sliding scale novolog, 4 units lantus/day, 17 g miralax BID, 30 mEq Klor-Con x1 dose 7/15, 10 mEq IV KCl x6 runs 7/14. IVF; D5-1/2 NS @ 75 ml/hr (306 kcal).   NUTRITION - FOCUSED PHYSICAL EXAM:  unable to complete at this time   Diet Order:   Diet Order            Diet heart healthy/carb modified Room service appropriate? Yes; Fluid consistency: Thin  Diet effective now                 EDUCATION NEEDS:   Not appropriate for education at this time  Skin:  Skin Assessment: Reviewed RN Assessment  Last BM:  7/15 x3 (all type 7)  Height:   Ht Readings from Last 1 Encounters:  06/28/20 _0  (1.702 m)    Weight:   Wt Readings from Last 1 Encounters:  06/28/20 57.3 kg    Ideal Body Weight:     BMI:  Body mass index is 19.79 kg/m.  Estimated Nutritional Needs:   Kcal:  2000-2200 kcal  Protein:  105-120 grams  Fluid:  >/= 2.4 L/day     Jarome Matin, MS, RD, LDN, CNSC Inpatient Clinical Dietitian RD pager # available in AMION  After hours/weekend pager # available in [facility]

## 2020-06-29 NOTE — Progress Notes (Signed)
Received patient from ICU, VS obtained, telemetry monitor applied, oriented to unit, call light placed in reach

## 2020-06-29 NOTE — Progress Notes (Signed)
[facility] Progress Note  CC:  GI bleed/diarrhea  Subjective: Per the patient's nurse he still has a large amount of hard stool in his rectum.  She said that he will not allow her to give him an enema or to disimpact him.  He has been aggressive and combative.  Keeps leaking liquid stool onto bedding.  No further sign of bleeding, however.  Objective:  Vital signs in last 24 hours: Temp:  [98 F (36.7 C)] 98 F (36.7 C) (07/15 0406) Pulse Rate:  [78-117] 98 (07/15 1000) Resp:  [11-28] 15 (07/15 1000) BP: (126-200)/(69-161) 142/82 (07/15 1000) SpO2:  [96 %-100 %] 100 % (07/15 1000) Weight:  [57.3 kg] 57.3 kg (07/14 2100) Last BM Date: 06/29/20 General:  Alert, Well-developed, in NAD Heart:  Regular rate and rhythm; no murmurs Pulm:  CTAB.  No increased WOB. Abdomen:  Soft, non-distended.  BS present.  Tender on left, particularly LLQ. Extremities:  Without edema. Neurologic:  Alert, but confused.  Only oriented to self.  Intake/Output from previous day: 07/14 0701 - 07/15 0700 In: 4613.8 [I.V.:1313.7; IV Piggyback:3300.1] Out: 100 [Urine:100] Intake/Output this shift: Total I/O In: 277.8 [I.V.:277.8] Out: -   Lab Results: Recent Labs    06/28/20 1325 06/28/20 2107 06/29/20 0247  WBC 5.3 5.9 8.1  HGB 8.5* 8.6* 9.9*  HCT 26.3* 26.6* 29.1*  PLT 194 193 235   BMET Recent Labs    06/28/20 1325 06/28/20 2107 06/29/20 0247  NA 140 138 136  K 2.5* 3.3* 3.2*  CL 105 104 102  CO2 23 20* 23  GLUCOSE 83 109* 74  BUN 11 11 10   CREATININE 0.55* 0.60* 0.47*  CALCIUM 7.9* 8.0* 8.0*   LFT Recent Labs    06/29/20 0247  PROT 5.0*  ALBUMIN 2.6*  AST 17  ALT 10  ALKPHOS 73  BILITOT 1.5*   PT/INR Recent Labs    06/28/20 0722  LABPROT 14.2  INR 1.1   VAS [place] LOWER EXTREMITY VENOUS (DVT) (ONLY [facility] & [facility] 7a-7p)  Result Date: 06/28/2020  Lower Venous DVTStudy Indications: Edema.  Comparison       Piror studies RT LEV 06-11-2020 and 06-07-2020.  Negative for Study:           DVT. Performing Technologist: Darlin Coco  Examination Guidelines: A complete evaluation includes B-mode imaging, spectral Doppler, color Doppler, and power Doppler as needed of all accessible portions of each vessel. Bilateral testing is considered an integral part of a complete examination. Limited examinations for reoccurring indications may be performed as noted. The reflux portion of the exam is performed with the patient in reverse Trendelenburg.  +---------+---------------+---------+-----------+----------+--------------+ RIGHT    CompressibilityPhasicitySpontaneityPropertiesThrombus Aging +---------+---------------+---------+-----------+----------+--------------+ CFV      Full           Yes      Yes                                 +---------+---------------+---------+-----------+----------+--------------+ SFJ      Full                                                        +---------+---------------+---------+-----------+----------+--------------+ FV Prox  Full                                                        +---------+---------------+---------+-----------+----------+--------------+  FV Mid   Full                                                        +---------+---------------+---------+-----------+----------+--------------+ FV DistalFull                                                        +---------+---------------+---------+-----------+----------+--------------+ PFV      Full                                                        +---------+---------------+---------+-----------+----------+--------------+ POP      Full           Yes      Yes                                 +---------+---------------+---------+-----------+----------+--------------+ PTV      Full                                                        +---------+---------------+---------+-----------+----------+--------------+ PERO     Full                                                         +---------+---------------+---------+-----------+----------+--------------+   +----+---------------+---------+-----------+----------+--------------+ LEFTCompressibilityPhasicitySpontaneityPropertiesThrombus Aging +----+---------------+---------+-----------+----------+--------------+ CFV Full           Yes      Yes                                 +----+---------------+---------+-----------+----------+--------------+     Summary: RIGHT: - There is no evidence of deep vein thrombosis in the lower extremity.  - No cystic structure found in the popliteal fossa.  LEFT: - No evidence of common femoral vein obstruction.  *See table(s) above for measurements and observations. Electronically signed by Curt Jews MD on 06/28/2020 at 7:40:49 PM.    Final    Assessment / Plan: Tony Marshall is a 79 y.o. male PMH significant for, but not necessarily limited to, GERD, hiatal hernia, Schatzki's ring, gastritis, remote colon polyps, diverticulosis, CLL diagnosed 2014, HTN, hyperlipidemia, DM,                                                                                                                                   #  Rectal bleeding / fecal impaction --Hemodynamically stable. Hgb is stable.  --Etiology not clear. Wife mentioned black stools at home but takes iron.  He passed dark red blood clots at home before presenting to the ED. Suspect lower GI bleed, maybe diverticular but stercoral ulcer also possible as he is impacted on DRE.  Some stool removed manually by his nurse today.  Stool was brown in color with small amount of red blood after repeat attempts. --Will not drink the Miralax.  Will not allow the nurse to give enema.  Received 5 mg dulcolax PO yesterday and 10 mg suppository today. --Monitor Hgb.                            --Dulcolax 10 mg PO ordered for today.  # Loose stools for weeks.  --I suspect this is overflow diarrhea  from constipation and large amount of stool in his rectum. He has been taking several anti-diarrheal agents a day for "diarrhea" at home. --Nurse was able to remove some stool during disimpaction while we were in the room today. --He is not drinking the Miralax. --Received 5 mg Dulcolax by mouth yesterday.  Will order another 10 mg daily.   --Will need Miralax BID upon discharge.  # DKA --on insulin drip  # CLL --On chemotherapy  # Chronic anemia --Stable. On chemotherapy. Takes iron at home.    LOS: 1 day   [name]. Karla Pavone  06/29/2020, 10:39 AM
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
## 2020-06-30 ENCOUNTER — Other Ambulatory Visit: Payer: Self-pay

## 2020-06-30 ENCOUNTER — Inpatient Hospital Stay (HOSPITAL_COMMUNITY): Payer: Medicare HMO

## 2020-06-30 DIAGNOSIS — I4891 Unspecified atrial fibrillation: Secondary | ICD-10-CM

## 2020-06-30 DIAGNOSIS — K922 Gastrointestinal hemorrhage, unspecified: Secondary | ICD-10-CM

## 2020-06-30 DIAGNOSIS — E1110 Type 2 diabetes mellitus with ketoacidosis without coma: Secondary | ICD-10-CM | POA: Diagnosis not present

## 2020-06-30 DIAGNOSIS — Z789 Other specified health status: Secondary | ICD-10-CM

## 2020-06-30 DIAGNOSIS — C9110 Chronic lymphocytic leukemia of B-cell type not having achieved remission: Secondary | ICD-10-CM | POA: Diagnosis not present

## 2020-06-30 DIAGNOSIS — K5641 Fecal impaction: Secondary | ICD-10-CM

## 2020-06-30 DIAGNOSIS — I429 Cardiomyopathy, unspecified: Secondary | ICD-10-CM | POA: Diagnosis not present

## 2020-06-30 DIAGNOSIS — R197 Diarrhea, unspecified: Secondary | ICD-10-CM

## 2020-06-30 LAB — MAGNESIUM
Magnesium: 1.4 mg/dL — ABNORMAL LOW (ref 1.7–2.4)
Magnesium: 1.5 mg/dL — ABNORMAL LOW (ref 1.7–2.4)

## 2020-06-30 LAB — HEMOGLOBIN AND HEMATOCRIT, BLOOD
HCT: 22.2 % — ABNORMAL LOW (ref 39.0–52.0)
Hemoglobin: 7.4 g/dL — ABNORMAL LOW (ref 13.0–17.0)

## 2020-06-30 LAB — GLUCOSE, CAPILLARY
Glucose-Capillary: 147 mg/dL — ABNORMAL HIGH (ref 70–99)
Glucose-Capillary: 108 mg/dL — ABNORMAL HIGH (ref 70–99)
Glucose-Capillary: 125 mg/dL — ABNORMAL HIGH (ref 70–99)
Glucose-Capillary: 265 mg/dL — ABNORMAL HIGH (ref 70–99)
Glucose-Capillary: 277 mg/dL — ABNORMAL HIGH (ref 70–99)
Glucose-Capillary: 206 mg/dL — ABNORMAL HIGH (ref 70–99)

## 2020-06-30 LAB — CBC
HCT: 19.1 % — ABNORMAL LOW (ref 39.0–52.0)
HCT: 18.8 % — ABNORMAL LOW (ref 39.0–52.0)
Hemoglobin: 6.2 g/dL — CL (ref 13.0–17.0)
Hemoglobin: 6 g/dL — CL (ref 13.0–17.0)
MCH: 31.2 pg (ref 26.0–34.0)
MCH: 30.9 pg (ref 26.0–34.0)
MCHC: 32.5 g/dL (ref 30.0–36.0)
MCHC: 31.9 g/dL (ref 30.0–36.0)
MCV: 96 fL (ref 80.0–100.0)
MCV: 96.9 fL (ref 80.0–100.0)
Platelets: 157 10*3/uL (ref 150–400)
Platelets: 138 10*3/uL — ABNORMAL LOW (ref 150–400)
RBC: 1.99 MIL/uL — ABNORMAL LOW (ref 4.22–5.81)
RBC: 1.94 MIL/uL — ABNORMAL LOW (ref 4.22–5.81)
RDW: 18.6 % — ABNORMAL HIGH (ref 11.5–15.5)
RDW: 18.6 % — ABNORMAL HIGH (ref 11.5–15.5)
WBC: 4.1 10*3/uL (ref 4.0–10.5)
WBC: 4.3 10*3/uL (ref 4.0–10.5)
nRBC: 0 % (ref 0.0–0.2)
nRBC: 0 % (ref 0.0–0.2)

## 2020-06-30 LAB — BASIC METABOLIC PANEL
Anion gap: 10 (ref 5–15)
Anion gap: 12 (ref 5–15)
Anion gap: 4 — ABNORMAL LOW (ref 5–15)
BUN: 7 mg/dL — ABNORMAL LOW (ref 8–23)
BUN: 7 mg/dL — ABNORMAL LOW (ref 8–23)
BUN: 7 mg/dL — ABNORMAL LOW (ref 8–23)
CO2: 22 mmol/L (ref 22–32)
CO2: 22 mmol/L (ref 22–32)
CO2: 25 mmol/L (ref 22–32)
Calcium: 7.7 mg/dL — ABNORMAL LOW (ref 8.9–10.3)
Calcium: 7.5 mg/dL — ABNORMAL LOW (ref 8.9–10.3)
Calcium: 7.4 mg/dL — ABNORMAL LOW (ref 8.9–10.3)
Chloride: 101 mmol/L (ref 98–111)
Chloride: 99 mmol/L (ref 98–111)
Chloride: 102 mmol/L (ref 98–111)
Creatinine, Ser: 0.49 mg/dL — ABNORMAL LOW (ref 0.61–1.24)
Creatinine, Ser: 0.53 mg/dL — ABNORMAL LOW (ref 0.61–1.24)
Creatinine, Ser: 0.55 mg/dL — ABNORMAL LOW (ref 0.61–1.24)
GFR calc Af Amer: >60 mL/min (ref >60)
GFR calc Af Amer: >60 mL/min (ref >60)
GFR calc Af Amer: >60 mL/min (ref >60)
GFR calc non Af Amer: >60 mL/min (ref >60)
GFR calc non Af Amer: >60 mL/min (ref >60)
GFR calc non Af Amer: >60 mL/min (ref >60)
Glucose, Bld: 178 mg/dL — ABNORMAL HIGH (ref 70–99)
Glucose, Bld: 332 mg/dL — ABNORMAL HIGH (ref 70–99)
Glucose, Bld: 209 mg/dL — ABNORMAL HIGH (ref 70–99)
Potassium: 2.9 mmol/L — ABNORMAL LOW (ref 3.5–5.1)
Potassium: 3.7 mmol/L (ref 3.5–5.1)
Potassium: 3.4 mmol/L — ABNORMAL LOW (ref 3.5–5.1)
Sodium: 133 mmol/L — ABNORMAL LOW (ref 135–145)
Sodium: 133 mmol/L — ABNORMAL LOW (ref 135–145)
Sodium: 131 mmol/L — ABNORMAL LOW (ref 135–145)

## 2020-06-30 LAB — PREPARE RBC (CROSSMATCH)

## 2020-06-30 MED ORDER — AMIODARONE HCL IN DEXTROSE 360-4.14 MG/200ML-% IV SOLN
30.0000 mg/h | INTRAVENOUS | Status: DC
  Administered 2020-06-30 – 2020-07-03 (×6): 30 mg/h via INTRAVENOUS
  Filled 2020-06-30 (×5): qty 200

## 2020-06-30 MED ORDER — POTASSIUM CHLORIDE 10 MEQ/100ML IV SOLN
10.0000 meq | INTRAVENOUS | Status: AC
  Administered 2020-06-30 (×4): 10 meq via INTRAVENOUS
  Filled 2020-06-30 (×2): qty 100

## 2020-06-30 MED ORDER — AMIODARONE LOAD VIA INFUSION
150.0000 mg | Freq: Once | INTRAVENOUS | Status: DC
  Filled 2020-06-30: qty 83.34

## 2020-06-30 MED ORDER — AMIODARONE HCL IN DEXTROSE 360-4.14 MG/200ML-% IV SOLN
60.0000 mg/h | INTRAVENOUS | Status: DC
  Administered 2020-06-30: 60 mg/h via INTRAVENOUS

## 2020-06-30 MED ORDER — DIGOXIN 0.25 MG/ML IJ SOLN
0.2500 mg | Freq: Once | INTRAMUSCULAR | Status: AC
  Administered 2020-06-30: 0.25 mg via INTRAVENOUS
  Filled 2020-06-30: qty 2

## 2020-06-30 MED ORDER — LIDOCAINE HCL 1 % IJ SOLN
INTRAMUSCULAR | Status: AC
  Filled 2020-06-30: qty 20

## 2020-06-30 MED ORDER — SORBITOL 70 % SOLN
960.0000 mL | TOPICAL_OIL | Freq: Once | ORAL | Status: DC
  Filled 2020-06-30: qty 473

## 2020-06-30 MED ORDER — PHENYLEPHRINE HCL-NACL 10-0.9 MG/250ML-% IV SOLN: 0.0000 ug/min | INTRAVENOUS | Status: DC

## 2020-06-30 MED ORDER — AMIODARONE IV BOLUS ONLY 150 MG/100ML
150.0000 mg | Freq: Once | INTRAVENOUS | Status: AC
  Administered 2020-06-30: 150 mg via INTRAVENOUS
  Filled 2020-06-30 (×2): qty 100

## 2020-06-30 MED ORDER — METOPROLOL TARTRATE 5 MG/5ML IV SOLN
2.5000 mg | Freq: Once | INTRAVENOUS | Status: AC
  Administered 2020-06-30: 2.5 mg via INTRAVENOUS

## 2020-06-30 MED ORDER — SODIUM CHLORIDE 0.9% IV SOLUTION: Freq: Once | INTRAVENOUS | Status: DC

## 2020-06-30 MED ORDER — HYDROCORTISONE 1 % EX CREA
TOPICAL_CREAM | Freq: Three times a day (TID) | CUTANEOUS | Status: DC
  Administered 2020-07-01: 1 via TOPICAL
  Filled 2020-06-30 (×2): qty 28

## 2020-06-30 MED ORDER — POTASSIUM CHLORIDE 20 MEQ PO PACK
40.0000 meq | PACK | Freq: Once | ORAL | Status: AC
  Administered 2020-06-30: 40 meq via ORAL
  Filled 2020-06-30: qty 2

## 2020-06-30 MED ORDER — LACTATED RINGERS IV BOLUS
1000.0000 mL | Freq: Once | INTRAVENOUS | Status: AC
  Administered 2020-06-30: 1000 mL via INTRAVENOUS

## 2020-06-30 MED ORDER — METOPROLOL TARTRATE 5 MG/5ML IV SOLN
INTRAVENOUS | Status: AC
  Filled 2020-06-30: qty 5

## 2020-06-30 MED ORDER — PHENYLEPHRINE HCL-NACL 10-0.9 MG/250ML-% IV SOLN
25.0000 ug/min | INTRAVENOUS | Status: DC
  Filled 2020-06-30: qty 250

## 2020-06-30 MED ORDER — SODIUM CHLORIDE 0.9 % IV SOLN: 250.0000 mL | INTRAVENOUS | Status: DC

## 2020-06-30 MED ORDER — LACTATED RINGERS IV SOLN: INTRAVENOUS | Status: DC

## 2020-06-30 MED ORDER — DILTIAZEM HCL-DEXTROSE 125-5 MG/125ML-% IV SOLN (PREMIX)
5.0000 mg/h | INTRAVENOUS | Status: DC
  Filled 2020-06-30: qty 125

## 2020-06-30 MED ORDER — INSULIN GLARGINE 100 UNIT/ML ~~LOC~~ SOLN
6.0000 [IU] | Freq: Every day | SUBCUTANEOUS | Status: DC
  Administered 2020-06-30 – 2020-07-10 (×11): 6 [IU] via SUBCUTANEOUS
  Filled 2020-06-30 (×11): qty 0.06

## 2020-06-30 NOTE — Consult Note (Signed)
Cardiology Consultation:   Patient ID: Tony Marshall MRN: [id number]; DOB: [birth year]  Admit date: 06/28/2020 Date of Consult: 06/30/2020  Primary Care Provider: Elayne Snare, MD [facility] Cardiologist: Skeet Latch, MD  [facility] Electrophysiologist:  None    Patient Profile:   Tony Marshall is a 79 y.o. Marshall with a history of CLL, DM, OSA, HTN, HLD, GERD, schatzkis's ring who is being seen today for the evaluation of atrial fibrillation at the request of Dr. Bryan Lemma.  History of Present Illness:   Tony Marshall is a 79 yo Marshall with history of cardiomyopathy,  CLL, DM, OSA, HTN, HLD, GERD, schatzkis's ring who is being seen for evaluation of atrial fibrillation. I saw him remotely in 2015 at which time he had a normal echo and stress test with no ischemia. He was admitted in June 2021 with DKA, dehydration and severe constipation. He was noted to be septic and neutropenic. He was treated for LE cellulitis. Echo June 2021 showed LVEF=25-30%. He was started on Coreg and Entresto. He was discharged on 06/20/20. He was readmitted 06/28/20 with maroon colored stools and found to have heme positive stools with DKA. He was seen by GI. He had onset of atrial fibrillation with RVR today. Hgb 7.4. Potassium 2.9.   He is awake and voices no complaints.   Past Medical History:  Diagnosis Date  . CLL (chronic lymphocytic leukemia) ([place]) dx'd 2014   progression 12/2019  . Diabetes mellitus without complication ([place])   . Diverticulosis   . External hemorrhoids   . GERD (gastroesophageal reflux disease)   . Hiatal hernia   . History of colon polyps   . Hyperlipidemia   . Hypertension   . Internal hemorrhoids   . Lymphocytosis   . Schatzki's ring   . Sleep apnea     Past Surgical History:  Procedure Laterality Date  . BLEPHAROPLASTY    . COLONOSCOPY  2012  . HEMORRHOID SURGERY     Sclerotherapy  . KNEE SURGERY Right 2002  . POLYPECTOMY    . SHOULDER SURGERY Right  2007     Home Medications:  Prior to Admission medications   Medication Sig Start Date End Date Taking? Authorizing Provider  acetaminophen (TYLENOL) 500 MG tablet Take 500 mg by mouth every 6 (six) hours as needed for moderate pain.   Yes [provider]  allopurinol (ZYLOPRIM) 100 MG tablet Take 1 tablet (100 mg total) by mouth 2 (two) times daily. 03/28/20  Yes Curt Bears, MD  ALPRAZolam Duanne Moron) 0.25 MG tablet Take 1 tablet (0.25 mg total) by mouth at bedtime as needed for anxiety. 06/21/20  Yes Curt Bears, MD  aspirin 81 MG EC tablet Take 81 mg by mouth daily. Swallow whole.   Yes [provider]  carvedilol (COREG) 12.5 MG tablet Take 1 tablet (12.5 mg total) by mouth 2 (two) times daily with a meal. 06/20/20 07/20/20 Yes Kc, Maren Beach, MD  diphenoxylate-atropine (LOMOTIL) 2.5-0.025 MG tablet 1 to 2 tablets PO QID prn diarrhea Patient taking differently: Take 1 tablet by mouth 2 (two) times daily as needed for diarrhea or loose stools.  06/23/20  Yes Tanner, Lyndon Code., PA-C  dronabinol (MARINOL) 2.5 MG capsule Take 1 capsule (2.5 mg total) by mouth 2 (two) times daily before a meal. Patient taking differently: Take 2.5 mg by mouth 2 (two) times daily as needed (for nausea).  05/30/20  Yes Heilingoetter, Cassandra L, PA-C  esomeprazole (NEXIUM) 40 MG capsule TAKE 1 CAPSULE BY MOUTH  [place] Patient taking differently: Take 40 mg by mouth daily.  04/28/20  Yes Pyrtle, Lajuan Lines, MD  folic acid (FOLVITE) 1 MG tablet Take 1 tablet (1 mg total) by mouth daily. 06/21/20 07/21/20 Yes Antonieta Pert, MD  insulin aspart (NOVOLOG FLEXPEN) 100 UNIT/ML FlexPen Inject 3-5 Units into the skin 3 (three) times daily with meals. Depending on the blood sugar level.   Yes [provider]  insulin degludec (TRESIBA FLEXTOUCH) 100 UNIT/ML FlexTouch Pen inject 7 units daily. if fasting blood sugars are over 140 increase dosage up to 15 units daily as directed. Patient  taking differently: Inject 5-6 Units into the skin See admin instructions. Takes only if blood sugar over 150 06/20/20  Yes Kc, Ramesh, MD  loperamide (IMODIUM A-D) 2 MG tablet Take 2 mg by mouth 4 (four) times daily as needed for diarrhea or loose stools.   Yes [provider]  oxyCODONE (OXY IR/ROXICODONE) 5 MG immediate release tablet Take 1 tablet (5 mg total) by mouth every 4 (four) hours as needed for severe pain. 06/23/20  Yes Tanner, Lyndon Code., PA-C  oxymetazoline (AFRIN) 0.05 % nasal spray Place 1 spray into both nostrils 2 (two) times daily as needed for congestion.   Yes [provider]  pioglitazone (ACTOS) 15 MG tablet Take 15 mg by mouth daily. 06/26/20  Yes [provider]  potassium chloride SA (KLOR-CON) 20 MEQ tablet Take 1 tablet (20 mEq total) by mouth 2 (two) times daily. Patient taking differently: Take 20 mEq by mouth daily.  05/12/20  Yes Tanner, Lyndon Code., PA-C  rosuvastatin (CRESTOR) 5 MG tablet TAKE 1 TABLET BY MOUTH EVERY DAY Patient taking differently: Take 5 mg by mouth daily.  04/21/20  Yes Elayne Snare, MD  sacubitril-valsartan (ENTRESTO) 24-26 MG Take 1 tablet by mouth 2 (two) times daily. 06/20/20 07/20/20 Yes Antonieta Pert, MD  senna (SENOKOT) 8.6 MG TABS tablet Take 1 tablet (8.6 mg total) by mouth daily. 06/21/20  Yes Antonieta Pert, MD  thiamine 100 MG tablet Take 1 tablet (100 mg total) by mouth daily. 06/21/20 07/21/20 Yes Antonieta Pert, MD  traMADol (ULTRAM) 50 MG tablet Take 1 tablet (50 mg total) by mouth every 12 (twelve) hours as needed. Patient taking differently: Take 50 mg by mouth every 12 (twelve) hours as needed for moderate pain.  06/21/20  Yes Curt Bears, MD  benazepril (LOTENSIN) 20 MG tablet Take 20 mg by mouth daily. Patient not taking: Reported on 06/28/2020 06/26/20   [provider]  cephALEXin (KEFLEX) 500 MG capsule Take 500 mg by mouth 4 (four) times daily.    [provider]  feeding supplement, GLUCERNA SHAKE, (GLUCERNA  SHAKE) LIQD Take 237 mLs by mouth 3 (three) times daily with meals. Patient not taking: Reported on 06/28/2020 06/20/20 07/20/20  Antonieta Pert, MD  glucagon (GLUCAGON EMERGENCY) 1 MG injection Inject 1 mg into the skin once as needed (For severe low blood sugar). 06/14/19   Elayne Snare, MD  metFORMIN (GLUCOPHAGE-XR) 500 MG 24 hr tablet Take 1 tablet (500 mg total) by mouth daily with supper. Patient not taking: Reported on 06/26/2020 09/16/19   Elayne Snare, MD  metroNIDAZOLE (FLAGYL) 250 MG tablet Take 1 tablet (250 mg total) by mouth 3 (three) times daily. 06/26/20   Elayne Snare, MD  [facility] VERIO test strip USE 1 STRIP TO CHECK GLUCOSE THREE TIMES DAILY 02/24/20   Elayne Snare, MD  prochlorperazine (COMPAZINE) 10 MG tablet Take 1 tablet (10  mg total) by mouth every 6 (six) hours as needed for nausea or vomiting. Patient not taking: Reported on 06/28/2020 03/28/20   Curt Bears, MD    Inpatient Medications: Scheduled Meds: . bisacodyl  10 mg Rectal Daily  . carvedilol  25 mg Oral BID WC  . chlorhexidine  15 mL Mouth Rinse BID  . Chlorhexidine Gluconate Cloth  6 each Topical Daily  . divalproex  125 mg Oral Q12H  . feeding supplement (KATE FARMS STANDARD 1.4)  325 mL Oral BID BM  . hydrocortisone cream   Topical TID  . insulin aspart  0-5 Units Subcutaneous QHS  . insulin aspart  0-9 Units Subcutaneous TID WC  . insulin glargine  4 Units Subcutaneous QHS  . mouth rinse  15 mL Mouth Rinse q12n4p  . metoprolol tartrate      . multivitamin with minerals  1 tablet Oral Daily  . polyethylene glycol  17 g Oral BID  . rosuvastatin  5 mg Oral Daily  . sacubitril-valsartan  1 tablet Oral BID  . sorbitol, milk of mag, mineral oil, glycerin (SMOG) enema  960 mL Rectal Once   Continuous Infusions: . sodium chloride Stopped (06/28/20 1610)  . dextrose 5 % and 0.45% NaCl 75 mL/hr at 06/30/20 1250  . diltiazem (CARDIZEM) infusion     PRN Meds: acetaminophen **OR** acetaminophen, dextrose,  diphenhydrAMINE, hydrALAZINE  Allergies:    Allergies  Allergen Reactions  . Glycopyrrolate Rash    Social History:   Social History   Socioeconomic History  . Marital status: Married    Spouse name: Not on file  . Number of children: 1  . Years of education: Not on file  . Highest education level: Not on file  Occupational History  . Occupation: Retail buyer: [employer]  Tobacco Use  . Smoking status: Never Smoker  . Smokeless tobacco: Former Systems developer    Types: Secondary school teacher  . Vaping Use: Never used  Substance and Sexual Activity  . Alcohol use: Yes    Alcohol/week: 6.0 standard drinks    Types: 6 Cans of beer per week  . Drug use: No  . Sexual activity: Yes  Other Topics Concern  . Not on file  Social History Narrative  . Not on file   Social Determinants of Health   Financial Resource Strain:   . Difficulty of Paying Living Expenses:   Food Insecurity:   . Worried About Charity fundraiser in the Last Year:   . Arboriculturist in the Last Year:   Transportation Needs:   . Film/video editor (Medical):   Marland Kitchen Lack of Transportation (Non-Medical):   Physical Activity:   . Days of Exercise per Week:   . Minutes of Exercise per Session:   Stress:   . Feeling of Stress :   Social Connections:   . Frequency of Communication with Friends and Family:   . Frequency of Social Gatherings with Friends and Family:   . Attends Religious Services:   . Active Member of Clubs or Organizations:   . Attends Archivist Meetings:   Marland Kitchen Marital Status:   Intimate Partner Violence:   . Fear of Current or Ex-Partner:   . Emotionally Abused:   Marland Kitchen Physically Abused:   . Sexually Abused:     Family History:   Family History  Problem Relation Age of Onset  . Kidney disease Mother   . CAD Maternal Aunt   .  Colon cancer Neg Hx   . Esophageal cancer Neg Hx   . Rectal cancer Neg Hx   . Stomach cancer Neg Hx      ROS:  Please see the  history of present illness.  All other ROS reviewed and negative.     Physical Exam/Data:   Vitals:   06/30/20 1303 06/30/20 1304 06/30/20 1305 06/30/20 1307  BP: (!) 69/48 (!) 87/47 117/86 (!) 78/56  Pulse: 86 (!) 101 (!) 113 78  Resp:      Temp:      TempSrc:      SpO2:      Weight:      Height:        Intake/Output Summary (Last 24 hours) at 06/30/2020 1331 Last data filed at 06/29/2020 1813 Gross per 24 hour  Intake 128.96 ml  Output 275 ml  Net -146.04 ml   Last 3 Weights 06/28/2020 06/28/2020 06/27/2020  Weight (lbs) 126 lb 5.2 oz 150 lb (No Data)  Weight (kg) 57.3 kg 68.04 kg (No Data)     Body mass index is 19.79 kg/m.  General:  Thin elderly Marshall, ill appearing. Pale.  HEENT: normal Neck: no JVD Vascular:radial pulses present Cardiac:  Irreg irreg. no murmur  Lungs:  clear to auscultation bilaterally, no wheezing, rhonchi or rales  Abd: soft, nontender Ext: no edema Musculoskeletal:  No deformities, BUE and BLE strength normal and equal Skin: warm and dry  Neuro:  No focal deficits noted Psych:  Normal affect   EKG:  The EKG was personally reviewed and demonstrates:  Atrial fib with rVR Telemetry:  Telemetry was personally reviewed and demonstrates:  Atrial fib with rvr  Relevant CV Studies:  Echo 06/07/20: 1. Left ventricular ejection fraction, by estimation, is 25 to 30%. The  left ventricle has severely decreased function. The left ventricle has no  regional wall motion abnormalities. Left ventricular diastolic parameters  are consistent with Grade II  diastolic dysfunction (pseudonormalization).  2. Right ventricular systolic function is normal. The right ventricular  size is normal. There is normal pulmonary artery systolic pressure.  3. The mitral valve is normal in structure. No evidence of mitral valve  regurgitation. No evidence of mitral stenosis.  4. The aortic valve is grossly normal. Aortic valve regurgitation is not  visualized. No aortic  stenosis is present.  5. The inferior vena cava is normal in size with greater than 50%  respiratory variability, suggesting right atrial pressure of 3 mmHg.   Laboratory Data:  High Sensitivity Troponin:   Recent Labs  Lab 06/05/20 1710  TROPONINIHS 11     Chemistry Recent Labs  Lab 06/28/20 2107 06/29/20 0247 06/30/20 0457  NA 138 136 133*  K 3.3* 3.2* 2.9*  CL 104 102 101  CO2 20* 23 22  GLUCOSE 109* 74 178*  BUN 11 10 7*  CREATININE 0.60* 0.47* 0.49*  CALCIUM 8.0* 8.0* 7.7*  GFRNONAA >60 >60 >60  GFRAA >60 >60 >60  ANIONGAP 14 11 10     Recent Labs  Lab 06/27/20 1010 06/28/20 0722 06/29/20 0247  PROT 4.7* 4.3* 5.0*  ALBUMIN 2.3* 2.2* 2.6*  AST 13* 13* 17  ALT 7 10 10   ALKPHOS 74 63 73  BILITOT 0.9 1.6* 1.5*   Hematology Recent Labs  Lab 06/28/20 2107 06/28/20 2107 06/29/20 0247 06/29/20 1328 06/30/20 0457  WBC 5.9  --  8.1 5.6  --   RBC 2.74*  --  3.09* 2.66*  --   HGB 8.6*   < >  9.9* 8.4* 7.4*  HCT 26.6*   < > 29.1* 25.4* 22.2*  MCV 97.1  --  94.2 95.5  --   MCH 31.4  --  32.0 31.6  --   MCHC 32.3  --  34.0 33.1  --   RDW 18.6*  --  18.5* 18.5*  --   PLT 193  --  235 192  --    < > = values in this interval not displayed.   BNPNo results for input(s): BNP, PROBNP in the last 168 hours.  DDimer No results for input(s): DDIMER in the last 168 hours.   Radiology/Studies:  DG Lumbar Spine 2-3 Views  Result Date: 06/27/2020 CLINICAL DATA:  Low back pain.  No known injury. EXAM: LUMBAR SPINE - 2-3 VIEW COMPARISON:  CT chest, abdomen and pelvis 06/07/2020. FINDINGS: Vertebral body height and alignment are maintained. Intervertebral disc space height is normal. Paraspinous structures demonstrate atherosclerosis. IMPRESSION: Normal appearing lumbar spine. Aortic Atherosclerosis (ICD10-I70.0). Electronically Signed   By: Inge Rise M.D.   On: 06/27/2020 11:01   VAS [place] LOWER EXTREMITY VENOUS (DVT) (ONLY [facility] & [facility] 7a-7p)  Result Date: 06/28/2020   Lower Venous DVTStudy Indications: Edema.  Comparison       Piror studies RT LEV 06-11-2020 and 06-07-2020. Negative for Study:           DVT. Performing Technologist: Darlin Coco  Examination Guidelines: A complete evaluation includes B-mode imaging, spectral Doppler, color Doppler, and power Doppler as needed of all accessible portions of each vessel. Bilateral testing is considered an integral part of a complete examination. Limited examinations for reoccurring indications may be performed as noted. The reflux portion of the exam is performed with the patient in reverse Trendelenburg.  +---------+---------------+---------+-----------+----------+--------------+ RIGHT    CompressibilityPhasicitySpontaneityPropertiesThrombus Aging +---------+---------------+---------+-----------+----------+--------------+ CFV      Full           Yes      Yes                                 +---------+---------------+---------+-----------+----------+--------------+ SFJ      Full                                                        +---------+---------------+---------+-----------+----------+--------------+ FV Prox  Full                                                        +---------+---------------+---------+-----------+----------+--------------+ FV Mid   Full                                                        +---------+---------------+---------+-----------+----------+--------------+ FV DistalFull                                                        +---------+---------------+---------+-----------+----------+--------------+  PFV      Full                                                        +---------+---------------+---------+-----------+----------+--------------+ POP      Full           Yes      Yes                                 +---------+---------------+---------+-----------+----------+--------------+ PTV      Full                                                         +---------+---------------+---------+-----------+----------+--------------+ PERO     Full                                                        +---------+---------------+---------+-----------+----------+--------------+   +----+---------------+---------+-----------+----------+--------------+ LEFTCompressibilityPhasicitySpontaneityPropertiesThrombus Aging +----+---------------+---------+-----------+----------+--------------+ CFV Full           Yes      Yes                                 +----+---------------+---------+-----------+----------+--------------+     Summary: RIGHT: - There is no evidence of deep vein thrombosis in the lower extremity.  - No cystic structure found in the popliteal fossa.  LEFT: - No evidence of common femoral vein obstruction.  *See table(s) above for measurements and observations. Electronically signed by Curt Jews MD on 06/28/2020 at 7:40:49 PM.    Final     Assessment and Plan:   1. Atrial fibrillation with RVR: He is anemic and hypokalemic. Heart rate 130s. With hypotension, agree with bolus of amiodarone followed by an amiodarone drip. Pt seen with the critical care team. Transfer to ICU. Anti-coagulation is contraindicated with GI bleeding. We will follow with you.   For questions or updates, please contact [place] Please consult [email or web address] for contact info under    Signed, Lauree Chandler, MD  06/30/2020 1:31 PM

## 2020-06-30 NOTE — Consult Note (Signed)
NAME:  FLORENCE ANTONELLI, MRN:  [id number], DOB:  [birth year], LOS: 2 ADMISSION DATE:  06/28/2020, CONSULTATION DATE:  7/16 REFERRING MD:  Dyanne Iha, CHIEF COMPLAINT:  PAF and hypotension    Brief History   79 year old male patient admitted initially with hypotension, lower GI bleed felt secondary to constipation and fecal impaction on 7/14, as well as DKA. Critical care asked to see on 7/16 when he developed sudden paroxysmal atrial fibrillation with associated hypotension  History of present illness   This is a 79 year old white male with history as mentioned below presented to the hospital on 7/14 With chief complaint of diarrhea, maroon-colored stool with blood clots, and in the emergency room was found to be hypotensive, had blood glucose of 252 with anion gap of 19 he had heme positive stools and initial hemoglobin of 8.6.  He was admitted to the internal medicine service, gastroenterology was consulted, he has been followed by them for known hiatal hernia, reflux and schatzki's ring in addition to known severe diverticulosis and both internal and external hemorrhoids.  Hospital course: Admitted, given IV fluids, IV insulin. GI consultation identified new severe constipation with fecal impaction and stercoral ulcer or hemorrhoidal bleeding.  Bowel regimen was recommended. On 7/14 did have some witnessed delirium.  Requiring a Air cabin crew.  Was combative with staff.  Was still impacted at that point. 7/15: Patient still having delirium.  GI bleeding seems to have stabilized.  On 7/16 developed new atrial fibrillation with RVR, was more lethargic, and notable hypotension with systolic blood pressure in the 60s to 70s, both pulmonary and critical care was asked to see   Past Medical History  CLL, diabetes type 2, obstructive sleep apnea, hypertension, hyperlipidemia, gastroesophageal reflux disease, schatzkis's ring. Prior admit June 2021 for DKA, dehydration and severe constipation.,    Systolic CM w/ EF 46-96% identified on ECHO June 2021.  Recently discharged on 7/6 at which time he went home on [place] and [place].    Significant Hospital Events   7/14 admitted.  Seen by cardiology.  Determined to have significant constipation with fecal impaction bleeding felt to be secondary to this stercoral ulcer or bleeding hemorrhoid. Stool softeners and laxative given. DKA-->got IVFs and insulin. 7/15 new delirium anion gap closed Hemoglobin stable 7/16: Hemoglobin dropped from 8.4 down to 7.4, new atrial fibrillation with RVR, had associated hypotension.  Potassium 2.9.  Anion gap closed.  Glycemic control improved.  Consults:  Pulmonary/critical care 7/16 Cardiology 7/16  Procedures:    Significant Diagnostic Tests:    Micro Data:    Antimicrobials:    Interim history/subjective:  Lethargic but interactive.  Complains of chest pressure  Objective   Blood pressure (Abnormal) 78/56, pulse 78, temperature 98.5 F (36.9 C), temperature source Oral, resp. rate 20, height 5\' 7"  (1.702 m), weight 57.3 kg, SpO2 100 %.        Intake/Output Summary (Last 24 hours) at 06/30/2020 1356 Last data filed at 06/29/2020 1813 Gross per 24 hour  Intake 128.96 ml  Output 275 ml  Net -146.04 ml   Filed Weights   06/28/20 0635 06/28/20 2100  Weight: 68 kg 57.3 kg    Examination: General: Frail 79 year old white male resting in bed HENT: Normocephalic atraumatic sclera nonicteric no JVD Lungs: Clear to auscultation on room air Cardiovascular: Rapid irregular heart rate with atrial fibrillation on telemetry Abdomen: Nontender Extremities: Brisk cap refill Neuro: Lethargic GU: Excellent urine output  Resolved Hospital Problem list   Diabetic ketoacidosis  Assessment &  Plan:  Atrial fibrillation with rapid ventricular response and associated cardiogenic shock -Has underlying history of systolic cardiomyopathy.  Suspect new paroxysmal atrial fibrillation is a result of  anemia plus/minus electrolyte abnormality and stress of acute illness Plan IV amiodarone bolus with infusion Agree with blood transfusion, typically would show up for hemoglobin less than 7 however he is symptomatic currently Gentle fluid bolus Phenylephrine for mean arterial goal greater than 65 Holding calcium channel blocker and beta-blockade Holding further digoxin for now Correct electrolytes Agree with transfer to ICU with telemetry monitoring Not a candidate for anticoagulation given GI bleeding  Systolic cardiomyopathy with EF 25 to 30% Plan Holding antihypertensives Holding beta-blockade Telemetry monitoring Further recommendations per cardiology  Symptomatic anemia with lower GI bleeding felt either due to stercoral ulcer or hemorrhoidal bleeding in the setting of fecal impaction -He is now moving his bowels Plan Continue bowel regimen Transfuse 1 unit Hold anticoagulation  Fluid electrolyte balance: Hypokalemia Plan Replace potassium Check magnesium Serial chemistries  Acute metabolic encephalopathy -Likely multifactorial, recent DKA, hypotension, hospitalization, question pharmacological Plan Supportive care Hold sedating medications  Diabetes with hyperglycemia Plan Continue sliding scale insulin  H/o CLL Plan F/u out pt      Best practice:  Diet: NPO Pain/Anxiety/Delirium protocol (if indicated): NA VAP protocol (if indicated): NA DVT prophylaxis: SCD GI prophylaxis: PPI Glucose control: ssi Mobility: BR Code Status: full code  Family Communication: updated  Disposition: icu   Labs   CBC: Recent Labs  Lab 06/27/20 1010 06/27/20 1010 06/28/20 0722 06/28/20 0722 06/28/20 1325 06/28/20 2107 06/29/20 0247 06/29/20 1328 06/30/20 0457  WBC 5.8  --  5.6  --  5.3 5.9 8.1 5.6  --   NEUTROABS 3.9  --  3.8  --   --   --   --   --   --   HGB 8.8*  --  8.6*   < > 8.5* 8.6* 9.9* 8.4* 7.4*  HCT 26.2*   < > 26.4*   < > 26.3* 26.6* 29.1* 25.4*  22.2*  MCV 92.3   < > 97.4  --  96.0 97.1 94.2 95.5  --   PLT 183  --  178  --  194 193 235 192  --    < > = values in this interval not displayed.    Basic Metabolic Panel: Recent Labs  Lab 06/27/20 1010 06/27/20 1010 06/28/20 0722 06/28/20 1325 06/28/20 2107 06/29/20 0247 06/30/20 0457  NA 138   < > 137 140 138 136 133*  K 3.2*   < > 3.2* 2.5* 3.3* 3.2* 2.9*  CL 103   < > 100 105 104 102 101  CO2 22   < > 18* 23 20* 23 22  GLUCOSE 123*   < > 252* 83 109* 74 178*  BUN 12   < > 12 11 11 10  7*  CREATININE 0.74  --  0.70 0.55* 0.60* 0.47* 0.49*  CALCIUM 8.3*   < > 8.2* 7.9* 8.0* 8.0* 7.7*  MG 1.3*  --   --   --   --  2.0  --    < > = values in this interval not displayed.   GFR: Estimated Creatinine Clearance: 63.7 mL/min (A) (by C-G formula based on SCr of 0.49 mg/dL (L)). Recent Labs  Lab 06/28/20 1325 06/28/20 2107 06/29/20 0247 06/29/20 1328  WBC 5.3 5.9 8.1 5.6    Liver Function Tests: Recent Labs  Lab 06/27/20 1010 06/28/20 0722 06/29/20 0247  AST  13* 13* 17  ALT 7 10 10   ALKPHOS 74 63 73  BILITOT 0.9 1.6* 1.5*  PROT 4.7* 4.3* 5.0*  ALBUMIN 2.3* 2.2* 2.6*   No results for input(s): LIPASE, AMYLASE in the last 168 hours. No results for input(s): AMMONIA in the last 168 hours.  ABG    Component Value Date/Time   PHART 7.491 (H) 06/07/2020 0355   PCO2ART 27.9 (L) 06/07/2020 0355   PO2ART 308 (H) 06/07/2020 0355   HCO3 21.7 06/28/2020 1325   TCO2 25 11/29/2007 1303   ACIDBASEDEF 2.7 (H) 06/28/2020 1325   O2SAT 50.0 06/28/2020 1325     Coagulation Profile: Recent Labs  Lab 06/28/20 0722  INR 1.1    Cardiac Enzymes: No results for input(s): CKTOTAL, CKMB, CKMBINDEX, TROPONINI in the last 168 hours.  HbA1C: Hgb A1c MFr Bld  Date/Time Value Ref Range Status  06/06/2020 09:47 AM 7.7 (H) 4.8 - 5.6 % Final    Comment:    (NOTE)         Prediabetes: 5.7 - 6.4         Diabetes: >6.4         Glycemic control for adults with diabetes: <7.0     03/20/2020 02:03 PM 4.7 4.6 - 6.5 % Final    Comment:    Glycemic Control Guidelines for People with Diabetes:Non Diabetic:  <6%Goal of Therapy: <7%Additional Action Suggested:  >8%     CBG: Recent Labs  Lab 06/29/20 1609 06/29/20 1943 06/30/20 0735 06/30/20 1153 06/30/20 1218  GLUCAP 158* 184* 147* 108* 125*    Review of Systems:   Not able   Past Medical History  He,  has a past medical history of CLL (chronic lymphocytic leukemia) ([place]) (dx'd 2014), Diabetes mellitus without complication ([place]), Diverticulosis, External hemorrhoids, GERD (gastroesophageal reflux disease), Hiatal hernia, History of colon polyps, Hyperlipidemia, Hypertension, Internal hemorrhoids, Lymphocytosis, Schatzki's ring, and Sleep apnea.   Surgical History    Past Surgical History:  Procedure Laterality Date  . BLEPHAROPLASTY    . COLONOSCOPY  2012  . HEMORRHOID SURGERY     Sclerotherapy  . KNEE SURGERY Right 2002  . POLYPECTOMY    . SHOULDER SURGERY Right 2007     Social History   reports that he has never smoked. He quit smokeless tobacco use about 7 years ago.  His smokeless tobacco use included chew. He reports current alcohol use of about 6.0 standard drinks of alcohol per week. He reports that he does not use drugs.   Family History   His family history includes CAD in his maternal aunt; Kidney disease in his mother. There is no history of Colon cancer, Esophageal cancer, Rectal cancer, or Stomach cancer.   Allergies Allergies  Allergen Reactions  . Glycopyrrolate Rash     Home Medications  Prior to Admission medications   Medication Sig Start Date End Date Taking? Authorizing Provider  acetaminophen (TYLENOL) 500 MG tablet Take 500 mg by mouth every 6 (six) hours as needed for moderate pain.   Yes [provider]  allopurinol (ZYLOPRIM) 100 MG tablet Take 1 tablet (100 mg total) by mouth 2 (two) times daily. 03/28/20  Yes Curt Bears, MD  ALPRAZolam Duanne Moron) 0.25 MG  tablet Take 1 tablet (0.25 mg total) by mouth at bedtime as needed for anxiety. 06/21/20  Yes Curt Bears, MD  aspirin 81 MG EC tablet Take 81 mg by mouth daily. Swallow whole.   Yes [provider]  carvedilol (COREG) 12.5 MG tablet  Take 1 tablet (12.5 mg total) by mouth 2 (two) times daily with a meal. 06/20/20 07/20/20 Yes Kc, Maren [place], MD  diphenoxylate-atropine (LOMOTIL) 2.5-0.025 MG tablet 1 to 2 tablets PO QID prn diarrhea Patient taking differently: Take 1 tablet by mouth 2 (two) times daily as needed for diarrhea or loose stools.  06/23/20  Yes Tanner, Lyndon Code., PA-C  dronabinol (MARINOL) 2.5 MG capsule Take 1 capsule (2.5 mg total) by mouth 2 (two) times daily before a meal. Patient taking differently: Take 2.5 mg by mouth 2 (two) times daily as needed (for nausea).  05/30/20  Yes Heilingoetter, Cassandra L, PA-C  esomeprazole (NEXIUM) 40 MG capsule TAKE 1 CAPSULE BY MOUTH EVERY MORNING 30 MINUTES BEFORE BREAKFAST Patient taking differently: Take 40 mg by mouth daily.  04/28/20  Yes Pyrtle, Lajuan Lines, MD  folic acid (FOLVITE) 1 MG tablet Take 1 tablet (1 mg total) by mouth daily. 06/21/20 07/21/20 Yes Antonieta Pert, MD  insulin aspart (NOVOLOG FLEXPEN) 100 UNIT/ML FlexPen Inject 3-5 Units into the skin 3 (three) times daily with meals. Depending on the blood sugar level.   Yes [provider]  insulin degludec (TRESIBA FLEXTOUCH) 100 UNIT/ML FlexTouch Pen inject 7 units daily. if fasting blood sugars are over 140 increase dosage up to 15 units daily as directed. Patient taking differently: Inject 5-6 Units into the skin See admin instructions. Takes only if blood sugar over 150 06/20/20  Yes Kc, Ramesh, MD  loperamide (IMODIUM A-D) 2 MG tablet Take 2 mg by mouth 4 (four) times daily as needed for diarrhea or loose stools.   Yes [provider]  oxyCODONE (OXY IR/ROXICODONE) 5 MG immediate release tablet Take 1 tablet (5 mg total) by mouth every 4 (four) hours as needed for severe pain.  06/23/20  Yes Tanner, Lyndon Code., PA-C  oxymetazoline (AFRIN) 0.05 % nasal spray Place 1 spray into both nostrils 2 (two) times daily as needed for congestion.   Yes [provider]  pioglitazone (ACTOS) 15 MG tablet Take 15 mg by mouth daily. 06/26/20  Yes [provider]  potassium chloride SA (KLOR-CON) 20 MEQ tablet Take 1 tablet (20 mEq total) by mouth 2 (two) times daily. Patient taking differently: Take 20 mEq by mouth daily.  05/12/20  Yes Tanner, Lyndon Code., PA-C  rosuvastatin (CRESTOR) 5 MG tablet TAKE 1 TABLET BY MOUTH EVERY DAY Patient taking differently: Take 5 mg by mouth daily.  04/21/20  Yes Elayne Snare, MD  sacubitril-valsartan (ENTRESTO) 24-26 MG Take 1 tablet by mouth 2 (two) times daily. 06/20/20 07/20/20 Yes Antonieta Pert, MD  senna (SENOKOT) 8.6 MG TABS tablet Take 1 tablet (8.6 mg total) by mouth daily. 06/21/20  Yes Antonieta Pert, MD  thiamine 100 MG tablet Take 1 tablet (100 mg total) by mouth daily. 06/21/20 07/21/20 Yes Antonieta Pert, MD  traMADol (ULTRAM) 50 MG tablet Take 1 tablet (50 mg total) by mouth every 12 (twelve) hours as needed. Patient taking differently: Take 50 mg by mouth every 12 (twelve) hours as needed for moderate pain.  06/21/20  Yes Curt Bears, MD  benazepril (LOTENSIN) 20 MG tablet Take 20 mg by mouth daily. Patient not taking: Reported on 06/28/2020 06/26/20   [provider]  cephALEXin (KEFLEX) 500 MG capsule Take 500 mg by mouth 4 (four) times daily.    [provider]  feeding supplement, GLUCERNA SHAKE, (GLUCERNA SHAKE) LIQD Take 237 mLs by mouth 3 (three) times daily with meals. Patient not taking: Reported on 06/28/2020 06/20/20  07/20/20  Antonieta Pert, MD  glucagon (GLUCAGON EMERGENCY) 1 MG injection Inject 1 mg into the skin once as needed (For severe low blood sugar). 06/14/19   Elayne Snare, MD  metFORMIN (GLUCOPHAGE-XR) 500 MG 24 hr tablet Take 1 tablet (500 mg total) by mouth daily with supper. Patient not taking: Reported on 06/26/2020  09/16/19   Elayne Snare, MD  metroNIDAZOLE (FLAGYL) 250 MG tablet Take 1 tablet (250 mg total) by mouth 3 (three) times daily. 06/26/20   Elayne Snare, MD  [facility] VERIO test strip USE 1 STRIP TO CHECK GLUCOSE THREE TIMES DAILY 02/24/20   Elayne Snare, MD  prochlorperazine (COMPAZINE) 10 MG tablet Take 1 tablet (10 mg total) by mouth every 6 (six) hours as needed for nausea or vomiting. Patient not taking: Reported on 06/28/2020 03/28/20   Curt Bears, MD     Critical care time: 40 min    Erick Colace ACNP-BC [facility] Pager # [phone] OR # [phone] if no answer

## 2020-06-30 NOTE — Progress Notes (Signed)
[facility] Progress Note Patient Name: Tony Marshall DOB: 09-17-1941 MRN: [id number]   Date of Service  06/30/2020  HPI/Events of Note  Received query from RN regarding blood transfusion.  Hgb at 6.0 and I agree that the patient will benefit from 2 units pRBC. No active signs of bleeding.  eICU Interventions  Complete transfusion of 2 units.  Check CBC in the AM.     Intervention Category Minor Interventions: Other:  Elsie Lincoln 06/30/2020, 10:13 PM

## 2020-06-30 NOTE — Progress Notes (Signed)
PROGRESS NOTE    Tony Marshall  EHO:[id number]  DOB: 09/12/1941  PCP: Elayne Snare, MD Admit date:06/28/2020 Chief compliant: diarrhea 79 y.o. male with medical history significant of CLL, DM, HTN, HLD, combined systolic/diastolic CHF presents with complaints of diarrhea which started suddenly on 7/13. Noted to be maroon in color with blood clots. Pt's wife arranged outpt visit with primary GI provider, Dr Hilarie Fredrickson, for 7/15, however, symptoms worsened and pt subsequently was brought to ED for further work up. Patient was recently discharged on 7/6 after treatment of septic shock from LE cellulitis. Pt completed course of Keflex.  Patient had normal echo in 2015 but in last admission noted to have low EF of 25% (new diagnosis) and was supposed to follow-up with cardiology on 7/16.  ED Course: Afebrile,found to have glucose of 252 with an anion gap of 19. Pt was started on IVF boluses with basal IVF hydration with insulin gtt. Pt was noted to be heme pos with hgb of 8.6. GI consulted by EDP. Hospital course: Patient admitted to [facility] and GI recommended laxatives for fecal impaction possibly causing stercoral colitis.  Hospital course complicated by extreme agitation/delirium where patient was combative/hitting staff members.  Multiple interventions including bedside sitter and IV Benadryl ordered on 7/15 with which he showed some improvement.  Patient was also seen by GI, I attempted manual disimpaction with partial relief.  Recommended to continue laxatives with plan to follow-up closely as inpatient.  Subjective: Several loose BMs this morning-likely related to encopresis.  Called by nurse to bedside as patient went into new onset A. fib with RVR, heart rate going as high as 150 with hypotension, systolic blood pressure 37C to 90s. Patient asymptomatic and in fact, appears much more awake alert oriented x3.  Objective: Vitals:   06/30/20 1303 06/30/20 1304 06/30/20 1305 06/30/20 1307  BP: (!)  69/48 (!) 87/47 117/86 (!) 78/56  Pulse: 86 (!) 101 (!) 113 78  Resp:      Temp:      TempSrc:      SpO2:      Weight:      Height:        Intake/Output Summary (Last 24 hours) at 06/30/2020 1621 Last data filed at 06/29/2020 1813 Gross per 24 hour  Intake 128.96 ml  Output 75 ml  Net 53.96 ml   Filed Weights   06/28/20 0635 06/28/20 2100  Weight: 68 kg 57.3 kg    Physical Examination: General: Thin built male, resting comfortably, denies any complaints, much more oriented and calm today. Heart: S1-S2 heard, irregular rhythm, tachycardic, no murmurs.  No leg edema noted Lungs: Equal air entry bilaterally, no rhonchi or rales on exam, no accessory muscle use Abdomen: Bowel sounds heard, nondistended, soft on exam but could not examine further Extremities: No pedal edema.  No cyanosis or clubbing. Neurological: Awake alert oriented x2 (thought he was at [facility]), calm and cooperative, moving all extremities, no focal deficits. skin: No wounds or rashes.   Data Reviewed: I have personally reviewed following labs and imaging studies  CBC: Recent Labs  Lab 06/27/20 1010 06/27/20 1010 06/28/20 0722 06/28/20 0722 06/28/20 1325 06/28/20 1325 06/28/20 2107 06/29/20 0247 06/29/20 1328 06/30/20 0457 06/30/20 1530  WBC 5.8  --  5.6   < > 5.3  --  5.9 8.1 5.6  --  4.1  NEUTROABS 3.9  --  3.8  --   --   --   --   --   --   --   --  HGB 8.8*  --  8.6*   < > 8.5*   < > 8.6* 9.9* 8.4* 7.4* 6.2*  HCT 26.2*   < > 26.4*   < > 26.3*   < > 26.6* 29.1* 25.4* 22.2* 19.1*  MCV 92.3   < > 97.4   < > 96.0  --  97.1 94.2 95.5  --  96.0  PLT 183  --  178   < > 194  --  193 235 192  --  157   < > = values in this interval not displayed.   Basic Metabolic Panel: Recent Labs  Lab 06/27/20 1010 06/28/20 0722 06/28/20 1325 06/28/20 2107 06/29/20 0247 06/30/20 0457 06/30/20 1530  NA 138   < > 140 138 136 133* 133*  K 3.2*   < > 2.5* 3.3* 3.2* 2.9* 3.7  CL 103   < > 105 104  102 101 99  CO2 22   < > 23 20* 23 22 22   GLUCOSE 123*   < > 83 109* 74 178* 332*  BUN 12   < > 11 11 10  7* 7*  CREATININE 0.74   < > 0.55* 0.60* 0.47* 0.49* 0.53*  CALCIUM 8.3*   < > 7.9* 8.0* 8.0* 7.7* 7.5*  MG 1.3*  --   --   --  2.0  --  1.4*   < > = values in this interval not displayed.   GFR: Estimated Creatinine Clearance: 63.7 mL/min (A) (by C-G formula based on SCr of 0.53 mg/dL (L)). Liver Function Tests: Recent Labs  Lab 06/27/20 1010 06/28/20 0722 06/29/20 0247  AST 13* 13* 17  ALT 7 10 10   ALKPHOS 74 63 73  BILITOT 0.9 1.6* 1.5*  PROT 4.7* 4.3* 5.0*  ALBUMIN 2.3* 2.2* 2.6*   No results for input(s): LIPASE, AMYLASE in the last 168 hours. No results for input(s): AMMONIA in the last 168 hours. Coagulation Profile: Recent Labs  Lab 06/28/20 0722  INR 1.1   Cardiac Enzymes: No results for input(s): CKTOTAL, CKMB, CKMBINDEX, TROPONINI in the last 168 hours. BNP (last 3 results) No results for input(s): PROBNP in the last 8760 hours. HbA1C: No results for input(s): HGBA1C in the last 72 hours. CBG: Recent Labs  Lab 06/29/20 1943 06/30/20 0735 06/30/20 1153 06/30/20 1218 06/30/20 1430  GLUCAP 184* 147* 108* 125* 265*   Lipid Profile: No results for input(s): CHOL, HDL, LDLCALC, TRIG, CHOLHDL, LDLDIRECT in the last 72 hours. Thyroid Function Tests: No results for input(s): TSH, T4TOTAL, FREET4, T3FREE, THYROIDAB in the last 72 hours. Anemia Panel: No results for input(s): VITAMINB12, FOLATE, FERRITIN, TIBC, IRON, RETICCTPCT in the last 72 hours. Sepsis Labs: No results for input(s): PROCALCITON, LATICACIDVEN in the last 168 hours.  Recent Results (from the past 240 hour(s))  SARS Coronavirus 2 by RT PCR (hospital order, performed in [facility] hospital lab) Nasopharyngeal Nasopharyngeal Swab     Status: None   Collection Time: 06/28/20  1:38 PM   Specimen: Nasopharyngeal Swab  Result Value Ref Range Status   SARS Coronavirus 2 NEGATIVE NEGATIVE  Final    Comment: (NOTE) SARS-CoV-2 target nucleic acids are NOT DETECTED.  The SARS-CoV-2 RNA is generally detectable in upper and lower respiratory specimens during the acute phase of infection. The lowest concentration of SARS-CoV-2 viral copies this assay can detect is 250 copies / mL. A negative result does not preclude SARS-CoV-2 infection and should not be used as the sole basis for treatment or other  patient management decisions.  A negative result may occur with improper specimen collection / handling, submission of specimen other than nasopharyngeal swab, presence of viral mutation(s) within the areas targeted by this assay, and inadequate number of viral copies (<250 copies / mL). A negative result must be combined with clinical observations, patient history, and epidemiological information.  Fact Sheet for Patients:   StrictlyIdeas.no  Fact Sheet for Healthcare Providers: BankingDealers.co.za  This test is not yet approved or  cleared by the [place] FDA and has been authorized for detection and/or diagnosis of SARS-CoV-2 by FDA under an Emergency Use Authorization (EUA).  This EUA will remain in effect (meaning this test can be used) for the duration of the COVID-19 declaration under Section 564(b)(1) of the Act, 21 U.S.C. section 360bbb-3(b)(1), unless the authorization is terminated or revoked sooner.  Performed at [facility], [place] [address], [place], [place] [address]   MRSA PCR Screening     Status: None   Collection Time: 06/28/20  8:54 PM   Specimen: Nasal Mucosa; Nasopharyngeal  Result Value Ref Range Status   MRSA by PCR NEGATIVE NEGATIVE Final    Comment:        The GeneXpert MRSA Assay (FDA approved for NASAL specimens only), is one component of a comprehensive MRSA colonization surveillance program. It is not intended to diagnose MRSA infection nor to guide or monitor  treatment for MRSA infections. Performed at [facility], [place] [address], [place], [place] [address]       Radiology Studies: [facility] Chest Port 1 View  Result Date: 06/30/2020 CLINICAL DATA:  Central line placement. EXAM: PORTABLE CHEST 1 VIEW COMPARISON:  June 10, 2020. FINDINGS: The heart size and mediastinal contours are within normal limits. No pneumothorax is noted. Left lung is clear. Mild right basilar subsegmental atelectasis is noted. Interval placement of left internal jugular catheter with distal tip in expected position of right atrium. The visualized skeletal structures are unremarkable. IMPRESSION: Interval placement of left internal jugular catheter with distal tip in expected position of right atrium. Withdrawal by 3-4 cm is recommended. Mild right basilar subsegmental atelectasis is noted. Electronically Signed   By: Marijo Conception M.D.   On: 06/30/2020 16:10      Scheduled Meds: . sodium chloride   Intravenous Once  . sodium chloride   Intravenous Once  . amiodarone  150 mg Intravenous Once  . bisacodyl  10 mg Rectal Daily  . chlorhexidine  15 mL Mouth Rinse BID  . Chlorhexidine Gluconate Cloth  6 each Topical Daily  . divalproex  125 mg Oral Q12H  . feeding supplement (KATE FARMS STANDARD 1.4)  325 mL Oral BID BM  . hydrocortisone cream   Topical TID  . insulin aspart  0-5 Units Subcutaneous QHS  . insulin aspart  0-9 Units Subcutaneous TID WC  . insulin glargine  4 Units Subcutaneous QHS  . mouth rinse  15 mL Mouth Rinse q12n4p  . multivitamin with minerals  1 tablet Oral Daily  . polyethylene glycol  17 g Oral BID  . rosuvastatin  5 mg Oral Daily  . sorbitol, milk of mag, mineral oil, glycerin (SMOG) enema  960 mL Rectal Once   Continuous Infusions: . amiodarone 60 mg/hr (06/30/20 1558)   Followed by  . amiodarone    . lactated ringers 75 mL/hr at 06/30/20 1546  . phenylephrine (NEO-SYNEPHRINE) Adult infusion Stopped (06/30/20 1600)  .  potassium chloride 10 mEq (06/30/20 1527)  Assessment/Plan:  1.  New onset A. fib with RVR: On bedside evaluation, patient asymptomatic without complaints of chest pain or dyspnea.  Bedside blood pressure check revealed hypotension with systolic 38L to 37D, limiting AV blocker administration (Cardizem/metoprolol).  Received 1 dose of digoxin 0.25 mg IV.  Heart rate improved from 150s to 130s.  Blood pressure transiently improved but again dropped to systolic 42A.  Stat cardiology and critical care consult obtained.  Patient known to see [facility], Dr. Angelena Form from 2015 (evaluated for chest pain) and to Dr. Oval Linsey from last admission (evaluated for new cardiomyopathy with low EF-plan for outpatient cardiac cath).  Patient transferred to ICU and initiated on amiodarone drip.  Morning labs show hypokalemia and also acute on chronic anemia with hemoglobin at 7.4.  Start replacement of potassium and blood transfusion ordered with wife's consent.  PCCM evaluating patient currently for further interventions.  Appreciate cardiology assistance.  2. DKA : POA. Patient noted to be on Premeal insulin and Tresiba 7 units at home.    Most recent hemoglobin A1c in June was 7.7. Started on insulin drip on admission. Bicarb normalized and AG closed. Transitioned to Lantus insulin 5 units with SSI on 7/14.  Patient remained on dextrose fluids overnight in concern for poor oral intake and low BG levels, reduced lantus to 4 units.  Blood glucose today up at 277.  DC dextrose fluid and transition to lactated Ringer's for hypotension.  Continue to monitor closely and adjust Lantus as needed.  Diabetes educator following.  2. Lower GI bleed with acute blood loss anemia: Had hematochezia in ED. Seen by GI who feel (based on recent imaging/rectal exam) he has fecal impaction with stercoral ulcer/hemorrhoids. Recommended aggressive bowel regimen.  Attempted fecal disimpaction at bedside yesterday but with partial results.  As mental  status now improved, can likely try oral mag citrate.  Enema deferred for now due to hypotension/in critical condition  3. Combined systolic/diastolic CHF: Appears compensated. EF of 25-30% noted on recent echo.Noted to be on Coreg and Entresto at home.  Patient was advised by cardiology to discontinue Actos, Benzapril and Norvasc in last admission.  Currently hypotensive and medications on hold.  4.  Altered mental status, delirium : Much improved today with standard delirium precautions, Depakote and IV Benadryl as needed use. Reluctant to utilize antipsychotics given prolonged Qtc on EKG (505 ms) . Recommended to avoid benzos. D/w psychiatry who will see patient , agreed with Depakote for now   5. CLL: Followed by oncology as outpatient. CBC trends appear stable, reviewed  6. Hypokalemia/hypomagnesemia: Received replacement yesterday.  Still low, replace IV and p.o. as soon as possible given cardiac arrhythmia.  Repeat potassium level this evening.  7. Recent admission for cellulitis with sepsis: now resolved, completed antibiotics.   8. Hyperlipidemia: resumed home meds  DVT prophylaxis: TED hose Code Status: Full code per record  Communication: Discussed with patient, wife and daughter at bedside.  Discussed with PCCM who requested hospitalist to continue to serve as primary attending and they will follow along as consultant..  Cardiology consulted Disposition Plan:   Status is: Inpatient  Remains inpatient appropriate because:IV treatments appropriate due to intensity of illness or inability to take PO   Dispo: The patient is from: Home              Anticipated d/c is to: To be decided              Anticipated d/c date is: 3 days  Patient currently is not medically stable to d/c.    Bedside critical care time spent: 38 minutes, noncritical care time 25 minutes  spent in discussions with care team and coordination of care.    Guilford Shi, MD [facility] Pager in [place]  If 7PM-7AM, please contact night-coverage [email or web address] 06/30/2020, 4:21 PM

## 2020-06-30 NOTE — Progress Notes (Signed)
Chest x-ray reviewed with no pneumothorax.  Central line is in too deep It was pulled out by 3 cm and resutured in place.  Marshell Garfinkel MD [facility] Please see [email or web address] for pager details.  06/30/2020, 5:03 PM

## 2020-06-30 NOTE — Procedures (Signed)
Central Venous Catheter Insertion Procedure Note  URA HAUSEN  [id number]  [birth year]  Date:06/30/20  Time:3:20 PM   Provider Performing:Pete Johnette Abraham Kary Kos   Procedure: Insertion of Non-tunneled Central Venous [phone]) with US guidance ([id number])   Indication(s) Difficult access  Consent Risks of the procedure as well as the alternatives and risks of each were explained to the patient and/or caregiver.  Consent for the procedure was obtained and is signed in the bedside chart  Anesthesia Topical only with 1% lidocaine   Timeout Verified patient identification, verified procedure, site/side was marked, verified correct patient position, special equipment/implants available, medications/allergies/relevant history reviewed, required imaging and test results available.  Sterile Technique Maximal sterile technique including full sterile barrier drape, hand hygiene, sterile gown, sterile gloves, mask, hair covering, sterile ultrasound probe cover (if used).  Procedure Description Area of catheter insertion was cleaned with chlorhexidine and draped in sterile fashion.  With real-time ultrasound guidance a central venous catheter was placed into the left internal jugular vein. Nonpulsatile blood flow and easy flushing noted in all ports.  The catheter was sutured in place and sterile dressing applied.  Complications/Tolerance None; patient tolerated the procedure well. Chest X-ray is ordered to verify placement for internal jugular or subclavian cannulation.   Chest x-ray is not ordered for femoral cannulation. Attempted left [place] was not able to cannulate d/t vessel collapsing  EBL Minimal  Specimen(s) None Erick Colace ACNP-BC [place] Pager # [phone] OR # [phone] if no answer

## 2020-06-30 NOTE — Consult Note (Signed)
Patient seen and psych consult completed. Tony Marshall is a 79 year old male with chronic lymphocytic leukemia, diabetes, obstructive sleep apnea, GERD who presented to the emergency room with diarrhea, GI bleed, fecal impaction, and hallucinations.  Psychiatry has been consulted for delirium, that has been complicated by prolonged QTC of 514.  On evaluation patient appears to be frail and elderly, very pale in apparent respiratory distress consent was obtained to get collateral from his wife and daughter who are both at the bedside.  Wife stated that she was concerned originally about his hallucinations that he experienced prior to coming to the hospital, however she now knows that he had an infection and that this is normal.  She states that he has developed delirium before when he was in the hospital and it resolved once the infection was complete.  Daughter expressed concerns regarding her father hallucinate in and speaking to people who have passed away.  She did present with anxiety, and some tearfulness was noted as she states " normally when people talk to their loved ones who have passed away there soon behind them."  Writer acknowledged her concerns and redirected her medical concerns to the medical team as I am a psychiatric nurse practitioner.  Support and reassurance, encouragement were offered to both family members at the bedside.  During the evaluation again patient was tachycardic and tachypneic, and in respiratory distress and unable to complete the evaluation the patient did appear to be following commands. .  Plan:   In regards to the psych consult that was placed patient continues to have a prolonged QTC has since been placed in ICU in critical condition and will be best to continue to refrain from antipsychotics at this time.   -We will also recommend continuing delirium precautions to include having family at the bedside, frequent reorientation, bed rails up when necessary, and  familiar faces. -  Also recommend continuing with IV Benadryl for agitation, and or Depakote if patient can tolerate.  If patient is no longer able to tolerate oral medication can consider switching to IV Depakote.   -Patient will need to have Depakote level checked in 4 days.  -Patient does not meet inpatient criteria at this time. - Please consider reconsult once patient is medically cleared if his delirium continues to present.  We will sign off.

## 2020-06-30 NOTE — Progress Notes (Signed)
CCMD notified RN that the pt had converted to Afib with RVR with HR in the 130s-140s. EKG complete. MD notified. New orders placed.

## 2020-06-30 NOTE — Progress Notes (Signed)
Tony Marshall)] 98.2 F (36.8 Marshall) (07/16 0934) Pulse Rate:  [85-97] 85 (07/16 0934) Resp:  [14-20] 16 (07/16 0934) BP: (128-148)/(66-108) 133/66 (07/16 0934) SpO2:  [95 %-100 %] 100 % (07/16 0934) Last BM Date: 06/30/20 General:  Alert, chronically ill-appearing, in NAD Heart:  Regular rate and rhythm; no murmurs Pulm:  CTAB.  No increased WOB. Abdomen:  Soft, non-distended.  BS present.  Non-tender. Extremities:  Without edema. Neurologic:  Alert.  Still somewhat confused.  Intake/Output from previous day: 07/15 0701 - 07/16 0700 In: 456.3 [I.V.:277.8; IV Piggyback:178.4] Out: 675 [Urine:675]  Lab Results: Recent Labs    06/28/20 2107 06/28/20 2107 06/29/20 0247 06/29/20 1328 06/30/20 0457  WBC 5.9  --  8.1 5.6  --   HGB 8.6*   < > 9.9* 8.4* 7.4*  HCT 26.6*   < > 29.1* 25.4* 22.2*  PLT 193  --  235 192  --    < > = values in this interval not displayed.   BMET Recent Labs    06/28/20 2107 06/29/20 0247 06/30/20 0457  NA 138 136 133*  K 3.3* 3.2* 2.9*  CL 104 102 101  CO2 20* 23 22  GLUCOSE 109* 74 178*  BUN 11 10 7*  CREATININE 0.60* 0.47* 0.49*  CALCIUM 8.0* 8.0* 7.7*   LFT Recent Labs    06/29/20 0247  PROT 5.0*  ALBUMIN 2.6*  AST 17  ALT 10  ALKPHOS 73  BILITOT 1.5*   PT/INR Recent Labs    06/28/20 0722  LABPROT 14.2  INR 1.1   Assessment / Plan: Tony Dahmen Shoemakeris a 79 y.o.malePMH significant for, but not necessarily limited to, GERD, hiatal hernia, Schatzki's ring, gastritis, remote colon polyps, diverticulosis, CLLdiagnosed 2014,HTN, hyperlipidemia,  DM, # Rectal bleeding / fecal impaction --Hemodynamically stable. Hgb is stable.  --Etiology not clear. Wife mentioned black stools at home but takes iron.  He passed dark red blood clots at home before presenting to the ED. Suspect lower GI bleed, maybe diverticular but stercoral ulcer also possible as he is impacted on DRE.  Some stool removed manually by his nurse yesterda.  Stool was brown in color with small amount of red blood after repeat attempts.  No further solid stool passed. --Previously was not drinking the MiraLAX.  Would continue to encourage him to drink that twice daily. --After speaking with him today he did agree to try to allow the nurses to give him an enema.  Smog enema was ordered.  # Loose stools for weeks. --I suspect this is overflow diarrhea from constipation and large amount of stool in his rectum. He has been taking several anti-diarrheal agents a day for "diarrhea" at home. --Nurse was able to remove some stool during disimpaction while we were in the room yesterday. --See above. --Will need Miralax BID upon discharge.  # DKA   # CLL --Oncology recommended no more chemo per family.  # Chronic anemia --Hemoglobin down another gram from yesterday, but no significant bleeding noted. Takes iron at home. --Monitory Hgb and transfuse if needed.   LOS: 2 days   Tony Marshall.  Tony Marshall  06/30/2020, 12:14 PM

## 2020-06-30 NOTE — Care Plan (Signed)
Spoke with [facility] on call provider re: blood administration. Clarified to transfuse total of 2 units.

## 2020-07-01 ENCOUNTER — Encounter (HOSPITAL_COMMUNITY): Payer: Self-pay | Admitting: Internal Medicine

## 2020-07-01 ENCOUNTER — Inpatient Hospital Stay (HOSPITAL_COMMUNITY): Payer: Medicare HMO

## 2020-07-01 DIAGNOSIS — I4891 Unspecified atrial fibrillation: Secondary | ICD-10-CM | POA: Diagnosis not present

## 2020-07-01 DIAGNOSIS — I5042 Chronic combined systolic (congestive) and diastolic (congestive) heart failure: Secondary | ICD-10-CM

## 2020-07-01 DIAGNOSIS — E1110 Type 2 diabetes mellitus with ketoacidosis without coma: Secondary | ICD-10-CM | POA: Diagnosis not present

## 2020-07-01 DIAGNOSIS — R579 Shock, unspecified: Secondary | ICD-10-CM

## 2020-07-01 DIAGNOSIS — I10 Essential (primary) hypertension: Secondary | ICD-10-CM | POA: Diagnosis not present

## 2020-07-01 DIAGNOSIS — Z789 Other specified health status: Secondary | ICD-10-CM | POA: Diagnosis not present

## 2020-07-01 DIAGNOSIS — C9110 Chronic lymphocytic leukemia of B-cell type not having achieved remission: Secondary | ICD-10-CM | POA: Diagnosis not present

## 2020-07-01 LAB — BASIC METABOLIC PANEL
Anion gap: 6 (ref 5–15)
BUN: 7 mg/dL — ABNORMAL LOW (ref 8–23)
CO2: 25 mmol/L (ref 22–32)
Calcium: 7.7 mg/dL — ABNORMAL LOW (ref 8.9–10.3)
Chloride: 101 mmol/L (ref 98–111)
Creatinine, Ser: 0.46 mg/dL — ABNORMAL LOW (ref 0.61–1.24)
GFR calc Af Amer: >60 mL/min (ref >60)
GFR calc non Af Amer: >60 mL/min (ref >60)
Glucose, Bld: 93 mg/dL (ref 70–99)
Potassium: 3.1 mmol/L — ABNORMAL LOW (ref 3.5–5.1)
Sodium: 132 mmol/L — ABNORMAL LOW (ref 135–145)

## 2020-07-01 LAB — CBC
HCT: 28.2 % — ABNORMAL LOW (ref 39.0–52.0)
HCT: 28.6 % — ABNORMAL LOW (ref 39.0–52.0)
Hemoglobin: 9.4 g/dL — ABNORMAL LOW (ref 13.0–17.0)
Hemoglobin: 9.4 g/dL — ABNORMAL LOW (ref 13.0–17.0)
MCH: 30.7 pg (ref 26.0–34.0)
MCH: 30.1 pg (ref 26.0–34.0)
MCHC: 33.3 g/dL (ref 30.0–36.0)
MCHC: 32.9 g/dL (ref 30.0–36.0)
MCV: 92.2 fL (ref 80.0–100.0)
MCV: 91.7 fL (ref 80.0–100.0)
Platelets: 129 K/uL — ABNORMAL LOW (ref 150–400)
Platelets: 150 K/uL (ref 150–400)
RBC: 3.06 MIL/uL — ABNORMAL LOW (ref 4.22–5.81)
RBC: 3.12 MIL/uL — ABNORMAL LOW (ref 4.22–5.81)
RDW: 17.6 % — ABNORMAL HIGH (ref 11.5–15.5)
RDW: 18.2 % — ABNORMAL HIGH (ref 11.5–15.5)
WBC: 4.2 K/uL (ref 4.0–10.5)
WBC: 3.7 K/uL — ABNORMAL LOW (ref 4.0–10.5)
nRBC: 0 % (ref 0.0–0.2)
nRBC: 0 % (ref 0.0–0.2)

## 2020-07-01 LAB — MAGNESIUM
Magnesium: 1.6 mg/dL — ABNORMAL LOW (ref 1.7–2.4)
Magnesium: 1.5 mg/dL — ABNORMAL LOW (ref 1.7–2.4)

## 2020-07-01 LAB — BASIC METABOLIC PANEL WITH GFR
Anion gap: 5 (ref 5–15)
BUN: 9 mg/dL (ref 8–23)
CO2: 25 mmol/L (ref 22–32)
Calcium: 7.5 mg/dL — ABNORMAL LOW (ref 8.9–10.3)
Chloride: 103 mmol/L (ref 98–111)
Creatinine, Ser: 0.6 mg/dL — ABNORMAL LOW (ref 0.61–1.24)
GFR calc Af Amer: >60 mL/min
GFR calc non Af Amer: >60 mL/min
Glucose, Bld: 196 mg/dL — ABNORMAL HIGH (ref 70–99)
Potassium: 3 mmol/L — ABNORMAL LOW (ref 3.5–5.1)
Sodium: 133 mmol/L — ABNORMAL LOW (ref 135–145)

## 2020-07-01 LAB — GLUCOSE, CAPILLARY
Glucose-Capillary: 114 mg/dL — ABNORMAL HIGH (ref 70–99)
Glucose-Capillary: 92 mg/dL (ref 70–99)
Glucose-Capillary: 208 mg/dL — ABNORMAL HIGH (ref 70–99)
Glucose-Capillary: 220 mg/dL — ABNORMAL HIGH (ref 70–99)

## 2020-07-01 MED ORDER — CARVEDILOL 12.5 MG PO TABS
12.5000 mg | ORAL_TABLET | Freq: Two times a day (BID) | ORAL | Status: DC
  Administered 2020-07-01 – 2020-07-04 (×7): 12.5 mg via ORAL
  Filled 2020-07-01 (×8): qty 1

## 2020-07-01 MED ORDER — POTASSIUM CHLORIDE CRYS ER 20 MEQ PO TBCR
40.0000 meq | EXTENDED_RELEASE_TABLET | Freq: Once | ORAL | Status: AC
  Administered 2020-07-01: 40 meq via ORAL
  Filled 2020-07-01: qty 2

## 2020-07-01 MED ORDER — TRAMADOL HCL 50 MG PO TABS
50.0000 mg | ORAL_TABLET | Freq: Four times a day (QID) | ORAL | Status: DC | PRN
  Administered 2020-07-01 – 2020-07-10 (×24): 50 mg via ORAL
  Filled 2020-07-01 (×25): qty 1

## 2020-07-01 MED ORDER — MAGNESIUM SULFATE 2 GM/50ML IV SOLN
2.0000 g | Freq: Once | INTRAVENOUS | Status: AC
  Administered 2020-07-01: 2 g via INTRAVENOUS
  Filled 2020-07-01: qty 50

## 2020-07-01 NOTE — Progress Notes (Signed)
PROGRESS NOTE    Tony Marshall  LSL:[id number] DOB: 20-Nov-1941 DOA: 06/28/2020 PCP: Elayne Snare, MD   Brief Narrative:  Admit date:06/28/2020 Chief compliant: diarrhea 79 y.o.malewith medical history significant ofCLL, DM, HTN, HLD, combined systolic/diastolic CHF presents with complaints of diarrhea which started suddenly on 7/13. Noted to be maroon in color with blood clots. Pt's wife arranged outpt visit with primary GI provider, Dr Hilarie Fredrickson, for 7/15, however, symptoms worsened and pt subsequently was brought to ED for further work up. Patient was recently discharged on 7/6 after treatment of septic shock from LE cellulitis. Pt completed course of Keflex.  Patient had normal echo in 2015 but in last admission noted to have low EF of 25% (new diagnosis) and was supposed to follow-up with cardiology on 7/16. ED Course: Afebrile,found to have glucose of 252 with an anion gap of 19. Pt was started on IVF boluses with basal IVF hydration with insulin gtt. Pt was noted to be heme pos with hgb of 8.6. GI consulted by EDP. Hospital course: Patient admitted to [facility] and GI recommended laxatives for fecal impaction possibly causing stercoral colitis.  Hospital course complicated by extreme agitation/delirium where patient was combative/hitting staff members.  Multiple interventions including bedside sitter and IV Benadryl ordered on 7/15 with which he showed some improvement.  Patient was also seen by GI, I attempted manual disimpaction with partial relief.  Recommended to continue laxatives with plan to follow-up closely as inpatient.   Assessment & Plan:   Active Problems:   Essential hypertension   Pure hypercholesterolemia   CLL (chronic lymphocytic leukemia) (HCC)   Increased anion gap metabolic acidosis   Symptomatic anemia   Hyperglycemia due to diabetes mellitus (HCC)   DKA (diabetic ketoacidoses) (HCC)   Systolic and diastolic CHF, chronic (HCC)   Fecal impaction (HCC)   Overflow  diarrhea   Rectal bleeding   Lower GI bleed   Central venous catheter in place   Shock circulatory ([place])   1.  New onset A. fib with RVR: On bedside evaluation, patient asymptomatic without complaints of chest pain or dyspnea.  Bedside blood pressure check revealed hypotension with systolic 11X to 72I, limiting AV blocker administration (Cardizem/metoprolol).  Received 1 dose of digoxin 0.25 mg IV.  Heart rate improved from 150s to 130s.  Blood pressure transiently improved but again dropped to systolic 20B.  Stat cardiology and critical care consult obtained.  Patient known to see [facility], Dr. Angelena Form from 2015 (evaluated for chest pain) and to Dr. Oval Linsey from last admission (evaluated for new cardiomyopathy with low EF-plan for outpatient cardiac cath).  Patient transferred to ICU and initiated on amiodarone drip which cards plans to continue for now.  Morning labs show hypokalemia and also acute on chronic anemia with hemoglobin at 9.4 s/p transfusion  Appreciate cardiology assistance.  2. DKA : POA. Patient noted to be on Premeal insulin and Tresiba 7 units at home.    Most recent hemoglobin A1c in June was 7.7. Started on insulin drip on admission. Bicarb normalized and AG closed. Transitioned to Lantus insulin 5 units with SSI on 7/14.  Patient remained on dextrose fluids overnight in concern for poor oral intake and low BG levels, reduced lantus to 4 units.  Blood glucose today up at 277.  DC dextrose fluid and transition to lactated Ringer's for hypotension.  Continue to monitor closely and adjust Lantus as needed.  Diabetes educator following.  2. Lower GI bleed with acute blood loss anemia: Had hematochezia in  ED. Seen by GI who feel (based on recent imaging/rectal exam) he has fecal impaction with stercoral ulcer/hemorrhoids. Recommended aggressive bowel regimen.  Attempted fecal disimpaction at bedside yesterday but with partial results. Appreciate GI input  3. Combined systolic/diastolic  CHF: Appears compensated. EF of 25-30% noted on recent echo.Noted to be on Coreg and Entresto at home.  Patient was advised by cardiology to discontinue Actos, Benzapril and Norvasc in last admission.  Currently hypotensive and medications on hold.  4.  Altered mental status, delirium :  Awakens but still confused, Depakote and IV Benadryl as needed use. Reluctant to utilize antipsychotics given prolonged Qtc on EKG (505 ms) . Recommended to avoid benzos. D/w psychiatry who saw patient , appreciate their input  5. CLL: Followed by oncology as outpatient. CBC trends appear stable, reviewed  6. Hypokalemia/hypomagnesemia: recheck and replete as needed  7. Recent admission for cellulitis with sepsis: now resolved, completed antibiotics.   8. Hyperlipidemia: resumed home meds  DVT prophylaxis: SCD/Compression stockings  Code Status: full    Code Status Orders  (From admission, onward)         Start     Ordered   06/28/20 1120  Full code  Continuous        06/28/20 1122        Code Status History    Date Active Date Inactive Code Status Order ID Comments User Context   06/05/2020 1839 06/21/2020 0024 Full Code [id number]  Cherene Altes, MD ED   Advance Care Planning Activity     Family Communication: wife at bedside  Disposition Plan:   Status is: Inpatient  Remains inpatient appropriate because:Ongoing diagnostic testing needed not appropriate for outpatient work up and IV treatments appropriate due to intensity of illness or inability to take PO   Dispo: The patient is from: Home              Anticipated d/c is to: Home              Anticipated d/c date is: 2 days              Patient currently is not medically stable to d/c.       Consults called: pccm, cards gi, psych Admission status: Inpatient   Consultants:   as above  Procedures:  CT ABDOMEN PELVIS WO CONTRAST  Result Date: 06/07/2020 CLINICAL DATA:  Respiratory failure and abdominal pain with  fever. History of colonic polyps and diverticulosis. History of CLL. EXAM: CT CHEST, ABDOMEN AND PELVIS WITHOUT CONTRAST TECHNIQUE: Multidetector CT imaging of the chest, abdomen and pelvis was performed following the standard protocol without IV contrast. COMPARISON:  Chest CTA 05/16/2020.  Abdominopelvic CT 04/05/2020. FINDINGS: CT CHEST FINDINGS Cardiovascular: Right arm PICC extends to the superior cavoatrial junction. There is diffuse atherosclerosis of the aorta, great vessels and coronary arteries. The heart is mildly enlarged. No significant pericardial fluid. Mediastinum/Nodes: There are no enlarged mediastinal, hilar or axillary lymph nodes. The thyroid gland, trachea and esophagus demonstrate no significant findings. Lungs/Pleura: There are new small bilateral pleural effusions with dependent opacities in both lungs, probably atelectasis. These obscure the previously demonstrated nodular densities in both lower lobes. These were not present on the abdominal CT from April and were probably inflammatory. No consolidation or new abnormality identified. There is mild central airway thickening. Musculoskeletal/Chest wall: No chest wall mass or suspicious osseous findings. CT ABDOMEN AND PELVIS FINDINGS Hepatobiliary: No focal hepatic abnormalities are identified on noncontrast imaging. The gallbladder is  mildly distended without evidence of gallstones, wall thickening or focal surrounding inflammation. Pancreas: No focal pancreatic abnormality or surrounding inflammation identified. Spleen: Resolution of previously demonstrated splenomegaly. No focal abnormality identified. Adrenals/Urinary Tract: Both adrenal glands appear normal. Stable dominant cyst in the lower interpolar region of the right kidney. No evidence of urinary tract calculus or hydronephrosis. New symmetric perinephric soft tissue stranding bilaterally without focal fluid collection. The bladder is decompressed by a Foley catheter. There is a  small amount of air in the bladder lumen. Possible mild bladder wall thickening. Stomach/Bowel: Enteric contrast is present within the colon, but there is none in the stomach or small bowel. The stomach and small bowel are decompressed without apparent focal wall thickening. The appendix appears normal. Diverticular changes are present within the descending and sigmoid colon. No evidence of bowel obstruction or perforation. Vascular/Lymphatic: Again demonstrated are several mildly enlarged retroperitoneal and left pelvic lymph nodes, similar to the prior CT and presumably related to the patient's CLL. No progressive adenopathy identified. Diffuse aortic and branch vessel atherosclerosis without acute vascular findings on noncontrast imaging. Reproductive: Central prostatic calcifications. Other: New small amount of ascites around the liver and in both pericolic gutters and the pelvis. No focal extraluminal fluid collection or extravasated enteric contrast. There is generalized edema throughout the intra-abdominal and subcutaneous fat. There is a stable small umbilical hernia containing only fat. Musculoskeletal: No acute or significant osseous findings. IMPRESSION: 1. New small bilateral pleural effusions, ascites and generalized edema throughout the intra-abdominal and subcutaneous fat, consistent with anasarca. No focal extraluminal fluid collection or extravasated enteric contrast. 2. New symmetric perinephric soft tissue stranding bilaterally without focal fluid collection. The bladder is decompressed by a Foley catheter. Possible mild bladder wall thickening. Correlate with urinalysis. 3. Probable atelectasis at both lung bases adjacent to the pleural effusions. No evidence of pneumonia. 4. Stable mildly enlarged retroperitoneal and left pelvic lymph nodes, presumably related to the patient's chronic lymphocytic leukemia. No significant residual splenomegaly. 5. Aortic Atherosclerosis (ICD10-I70.0).  Electronically Signed   By: Richardean Sale M.D.   On: 06/07/2020 14:08   DG Lumbar Spine 2-3 Views  Result Date: 06/27/2020 CLINICAL DATA:  Low back pain.  No known injury. EXAM: LUMBAR SPINE - 2-3 VIEW COMPARISON:  CT chest, abdomen and pelvis 06/07/2020. FINDINGS: Vertebral body height and alignment are maintained. Intervertebral disc space height is normal. Paraspinous structures demonstrate atherosclerosis. IMPRESSION: Normal appearing lumbar spine. Aortic Atherosclerosis (ICD10-I70.0). Electronically Signed   By: Inge Rise M.D.   On: 06/27/2020 11:01   CT Head Wo Contrast  Result Date: 06/05/2020 CLINICAL DATA:  Altered mental status fatigue and weakness EXAM: CT HEAD WITHOUT CONTRAST TECHNIQUE: Contiguous axial images were obtained from the base of the skull through the vertex without intravenous contrast. COMPARISON:  MRI 11/07/2014 FINDINGS: Brain: No acute territorial infarction, hemorrhage, or intracranial mass. Mild to moderate atrophy. Mild hypodensity in the white matter consistent with chronic small vessel ischemic change. Prominent ventricle size, likely due to atrophy. Vascular: No hyperdense vessels.  No unexpected calcification Skull: Motion artifact.  No gross fracture Sinuses/Orbits: No acute finding. Other: None IMPRESSION: 1. No CT evidence for acute intracranial abnormality. 2. Atrophy and mild chronic small vessel ischemic changes of the white matter. Electronically Signed   By: Donavan Foil M.D.   On: 06/05/2020 17:29   CT CHEST WO CONTRAST  Result Date: 06/07/2020 CLINICAL DATA:  Respiratory failure and abdominal pain with fever. History of colonic polyps and diverticulosis. History of CLL. EXAM:  CT CHEST, ABDOMEN AND PELVIS WITHOUT CONTRAST TECHNIQUE: Multidetector CT imaging of the chest, abdomen and pelvis was performed following the standard protocol without IV contrast. COMPARISON:  Chest CTA 05/16/2020.  Abdominopelvic CT 04/05/2020. FINDINGS: CT CHEST FINDINGS  Cardiovascular: Right arm PICC extends to the superior cavoatrial junction. There is diffuse atherosclerosis of the aorta, great vessels and coronary arteries. The heart is mildly enlarged. No significant pericardial fluid. Mediastinum/Nodes: There are no enlarged mediastinal, hilar or axillary lymph nodes. The thyroid gland, trachea and esophagus demonstrate no significant findings. Lungs/Pleura: There are new small bilateral pleural effusions with dependent opacities in both lungs, probably atelectasis. These obscure the previously demonstrated nodular densities in both lower lobes. These were not present on the abdominal CT from April and were probably inflammatory. No consolidation or new abnormality identified. There is mild central airway thickening. Musculoskeletal/Chest wall: No chest wall mass or suspicious osseous findings. CT ABDOMEN AND PELVIS FINDINGS Hepatobiliary: No focal hepatic abnormalities are identified on noncontrast imaging. The gallbladder is mildly distended without evidence of gallstones, wall thickening or focal surrounding inflammation. Pancreas: No focal pancreatic abnormality or surrounding inflammation identified. Spleen: Resolution of previously demonstrated splenomegaly. No focal abnormality identified. Adrenals/Urinary Tract: Both adrenal glands appear normal. Stable dominant cyst in the lower interpolar region of the right kidney. No evidence of urinary tract calculus or hydronephrosis. New symmetric perinephric soft tissue stranding bilaterally without focal fluid collection. The bladder is decompressed by a Foley catheter. There is a small amount of air in the bladder lumen. Possible mild bladder wall thickening. Stomach/Bowel: Enteric contrast is present within the colon, but there is none in the stomach or small bowel. The stomach and small bowel are decompressed without apparent focal wall thickening. The appendix appears normal. Diverticular changes are present within the  descending and sigmoid colon. No evidence of bowel obstruction or perforation. Vascular/Lymphatic: Again demonstrated are several mildly enlarged retroperitoneal and left pelvic lymph nodes, similar to the prior CT and presumably related to the patient's CLL. No progressive adenopathy identified. Diffuse aortic and branch vessel atherosclerosis without acute vascular findings on noncontrast imaging. Reproductive: Central prostatic calcifications. Other: New small amount of ascites around the liver and in both pericolic gutters and the pelvis. No focal extraluminal fluid collection or extravasated enteric contrast. There is generalized edema throughout the intra-abdominal and subcutaneous fat. There is a stable small umbilical hernia containing only fat. Musculoskeletal: No acute or significant osseous findings. IMPRESSION: 1. New small bilateral pleural effusions, ascites and generalized edema throughout the intra-abdominal and subcutaneous fat, consistent with anasarca. No focal extraluminal fluid collection or extravasated enteric contrast. 2. New symmetric perinephric soft tissue stranding bilaterally without focal fluid collection. The bladder is decompressed by a Foley catheter. Possible mild bladder wall thickening. Correlate with urinalysis. 3. Probable atelectasis at both lung bases adjacent to the pleural effusions. No evidence of pneumonia. 4. Stable mildly enlarged retroperitoneal and left pelvic lymph nodes, presumably related to the patient's chronic lymphocytic leukemia. No significant residual splenomegaly. 5. Aortic Atherosclerosis (ICD10-I70.0). Electronically Signed   By: Richardean Sale M.D.   On: 06/07/2020 14:08   DG CHEST PORT 1 VIEW  Result Date: 07/01/2020 CLINICAL DATA:  79 year old male with acute respiratory failure EXAM: PORTABLE CHEST 1 VIEW COMPARISON:  06/30/2020 FINDINGS: Cardiomediastinal silhouette unchanged in size and contour. Similar appearance of architectural distortion of  the right greater than left lungs. No pneumothorax. No large pleural effusion. Coarsened interstitial markings persist with no new confluent airspace disease. Interval slight withdrawal of the  left IJ central venous catheter with the tip appearing to terminate superior vena cava. IMPRESSION: Similar appearance of the lungs, with chronic lung changes, scarring/atelectasis and no new confluent airspace disease. Interval slight withdrawal of left IJ central venous catheter. Electronically Signed   By: Corrie Mckusick D.O.   On: 07/01/2020 08:54   DG Chest Port 1 View  Result Date: 06/30/2020 CLINICAL DATA:  Central line placement. EXAM: PORTABLE CHEST 1 VIEW COMPARISON:  June 10, 2020. FINDINGS: The heart size and mediastinal contours are within normal limits. No pneumothorax is noted. Left lung is clear. Mild right basilar subsegmental atelectasis is noted. Interval placement of left internal jugular catheter with distal tip in expected position of right atrium. The visualized skeletal structures are unremarkable. IMPRESSION: Interval placement of left internal jugular catheter with distal tip in expected position of right atrium. Withdrawal by 3-4 cm is recommended. Mild right basilar subsegmental atelectasis is noted. Electronically Signed   By: Marijo Conception M.D.   On: 06/30/2020 16:10   DG CHEST PORT 1 VIEW  Result Date: 06/10/2020 CLINICAL DATA:  Respiratory abnormality. EXAM: PORTABLE CHEST 1 VIEW COMPARISON:  June 08, 2020 FINDINGS: A right PICC line terminates in the central SVC. Haziness in the lung bases is identified. Mild cardiomegaly. The hila and mediastinum are unremarkable. No nodules or masses. No overt edema. IMPRESSION: 1. Increasing hazy opacities in the lung bases could represent developing infiltrate or layering effusions with underlying atelectasis. Recommend clinical correlation and attention on follow-up. 2. The right PICC line remains in good position. 3. No acute interval changes  otherwise seen. Electronically Signed   By: Dorise Bullion III M.D   On: 06/10/2020 06:41   DG CHEST PORT 1 VIEW  Result Date: 06/08/2020 CLINICAL DATA:  Sepsis. EXAM: PORTABLE CHEST 1 VIEW COMPARISON:  CT report 06/07/2020.  Chest x-ray 06/07/2020. FINDINGS: Right PICC line noted with tip at cavoatrial junction. Heart size stable. Mild bibasilar atelectasis. No pleural effusion or pneumothorax. Stable small calcific density noted the right humeral head, most likely a bone island. IMPRESSION: 1.  Right PICC line noted with tip at cavoatrial junction. 2.  Low lung volumes.  Mild bibasilar atelectasis. Electronically Signed   By: Marcello Moores  Register   On: 06/08/2020 07:25   DG Chest Port 1 View  Result Date: 06/07/2020 CLINICAL DATA:  Respiratory failure EXAM: PORTABLE CHEST 1 VIEW COMPARISON:  06/06/2020 FINDINGS: Cardiac shadow is at the upper limits of normal in size and stable. Aortic calcifications are again seen. The lungs are well aerated bilaterally. No focal infiltrate or sizable effusion is seen. No bony abnormality is noted. IMPRESSION: No acute abnormality seen. Electronically Signed   By: Inez Catalina M.D.   On: 06/07/2020 09:03   DG CHEST PORT 1 VIEW  Result Date: 06/06/2020 CLINICAL DATA:  Possible pneumonia EXAM: PORTABLE CHEST 1 VIEW COMPARISON:  06/05/2020 FINDINGS: Cardiac shadow is stable. Tortuous aorta with calcifications is again seen. Lungs are well aerated bilaterally. No focal infiltrate or sizable effusion is seen. Some accentuation of the mediastinum is noted due to patient rotation. IMPRESSION: No acute abnormality noted. Electronically Signed   By: Inez Catalina M.D.   On: 06/06/2020 13:25   DG Chest Portable 1 View  Result Date: 06/05/2020 CLINICAL DATA:  Fatigue and weakness. EXAM: PORTABLE CHEST 1 VIEW COMPARISON:  May 16, 2020 FINDINGS: The study is limited secondary to patient rotation. There is no evidence of acute infiltrate, pleural effusion or pneumothorax. The heart  size and  mediastinal contours are within normal limits. A chronic tenth left rib fracture is seen. The visualized skeletal structures are otherwise unremarkable. IMPRESSION: No active disease. Electronically Signed   By: Virgina Norfolk M.D.   On: 06/05/2020 16:07   ECHOCARDIOGRAM COMPLETE  Result Date: 06/07/2020    ECHOCARDIOGRAM REPORT   Patient Name:   Tony Marshall Sowles Date of Exam: 06/07/2020 Medical Rec #:  [id number]         Height:       68.5 in Accession #:    [id number]        Weight:       130.1 lb Date of Birth:  24-Sep-1941         BSA:          17.711 m Patient Age:    68 years          BP:           93/60 mmHg Patient Gender: M                 HR:           69 bpm. Exam Location:  Inpatient Procedure: 2D Echo, Cardiac Doppler, Color Doppler and Intracardiac            Opacification Agent Indications:    Chemo V67.2 / Z09                 Fever 780.6 / R50.9  History:        Patient has no prior history of Echocardiogram examinations.                 Risk Factors:Hypertension, Diabetes, Dyslipidemia and                 Non-Smoker. GERD.  Sonographer:    Vickie Epley RDCS Referring Phys: [id number] [place]  1. Left ventricular ejection fraction, by estimation, is 25 to 30%. The left ventricle has severely decreased function. The left ventricle has no regional wall motion abnormalities. Left ventricular diastolic parameters are consistent with Grade II diastolic dysfunction (pseudonormalization).  2. Right ventricular systolic function is normal. The right ventricular size is normal. There is normal pulmonary artery systolic pressure.  3. The mitral valve is normal in structure. No evidence of mitral valve regurgitation. No evidence of mitral stenosis.  4. The aortic valve is grossly normal. Aortic valve regurgitation is not visualized. No aortic stenosis is present.  5. The inferior vena cava is normal in size with greater than 50% respiratory variability, suggesting right atrial  pressure of 3 mmHg. FINDINGS  Left Ventricle: Left ventricular ejection fraction, by estimation, is 25 to 30%. The left ventricle has severely decreased function. The left ventricle has no regional wall motion abnormalities. Definity contrast agent was given IV to delineate the left  ventricular endocardial borders. The left ventricular internal cavity size was normal in size. There is no left ventricular hypertrophy. Left ventricular diastolic parameters are consistent with Grade II diastolic dysfunction (pseudonormalization). Right Ventricle: The right ventricular size is normal. No increase in right ventricular wall thickness. Right ventricular systolic function is normal. There is normal pulmonary artery systolic pressure. The tricuspid regurgitant velocity is 1.96 m/s, and  with an assumed right atrial pressure of 3 mmHg, the estimated right ventricular systolic pressure is 64.3 mmHg. Left Atrium: Left atrial size was normal in size. Right Atrium: Right atrial size was normal in size. Pericardium: There is no evidence of pericardial effusion. Mitral Valve: The  mitral valve is normal in structure. No evidence of mitral valve regurgitation. No evidence of mitral valve stenosis. Tricuspid Valve: The tricuspid valve is normal in structure. Tricuspid valve regurgitation is mild . No evidence of tricuspid stenosis. Aortic Valve: The aortic valve is grossly normal. Aortic valve regurgitation is not visualized. No aortic stenosis is present. Pulmonic Valve: The pulmonic valve was normal in structure. Pulmonic valve regurgitation is not visualized. No evidence of pulmonic stenosis. Aorta: The aortic root and ascending aorta are structurally normal, with no evidence of dilitation. Venous: The inferior vena cava is normal in size with greater than 50% respiratory variability, suggesting right atrial pressure of 3 mmHg. IAS/Shunts: The atrial septum is grossly normal.  LEFT VENTRICLE PLAX 2D LVIDd:         4.40 cm       Diastology LVIDs:         3.70 cm      LV e' lateral:   7.27 cm/s LV PW:         0.70 cm      LV E/e' lateral: 10.3 LV IVS:        0.70 cm      LV e' medial:    6.57 cm/s LVOT diam:     1.80 cm      LV E/e' medial:  11.4 LV SV:         26 LV SV Index:   15 LVOT Area:     2.54 cm  LV Volumes (MOD) LV vol d, MOD A2C: 126.0 ml LV vol d, MOD A4C: 113.0 ml LV vol s, MOD A2C: 85.1 ml LV vol s, MOD A4C: 60.7 ml LV SV MOD A2C:     40.9 ml LV SV MOD A4C:     113.0 ml LV SV MOD BP:      44.6 ml RIGHT VENTRICLE RV S prime:     7.97 cm/s TAPSE (M-mode): 1.6 cm LEFT ATRIUM             Index       RIGHT ATRIUM           Index LA diam:        3.10 cm 1.81 cm/m  RA Area:     13.40 cm LA Vol (A2C):   28.0 ml 16.36 ml/m RA Volume:   31.10 ml  18.17 ml/m LA Vol (A4C):   23.0 ml 13.44 ml/m LA Biplane Vol: 27.5 ml 16.07 ml/m  AORTIC VALVE LVOT Vmax:   58.50 cm/s LVOT Vmean:  39.600 cm/s LVOT VTI:    0.102 m  AORTA Ao Root diam: 3.40 cm MITRAL VALVE               TRICUSPID VALVE MV Area (PHT): 4.06 cm    TR Peak grad:   15.4 mmHg MV Decel Time: 187 msec    TR Vmax:        196.00 cm/s MV E velocity: 75.00 cm/s MV A velocity: 72.40 cm/s  SHUNTS MV E/A ratio:  1.04        Systemic VTI:  0.10 m                            Systemic Diam: 1.80 cm Mertie Moores MD Electronically signed by Mertie Moores MD Signature Date/Time: 06/07/2020/12:52:20 PM    Final    VAS [place] LOWER EXTREMITY VENOUS (DVT) (ONLY [facility] & [facility] 7a-7p)  Result Date: 06/28/2020  Lower  Venous DVTStudy Indications: Edema.  Comparison       Piror studies RT LEV 06-11-2020 and 06-07-2020. Negative for Study:           DVT. Performing Technologist: Darlin Coco  Examination Guidelines: A complete evaluation includes B-mode imaging, spectral Doppler, color Doppler, and power Doppler as needed of all accessible portions of each vessel. Bilateral testing is considered an integral part of a complete examination. Limited examinations for reoccurring indications may be performed as  noted. The reflux portion of the exam is performed with the patient in reverse Trendelenburg.  +---------+---------------+---------+-----------+----------+--------------+  RIGHT     Compressibility Phasicity Spontaneity Properties Thrombus Aging  +---------+---------------+---------+-----------+----------+--------------+  CFV       Full            Yes       Yes                                    +---------+---------------+---------+-----------+----------+--------------+  SFJ       Full                                                             +---------+---------------+---------+-----------+----------+--------------+  FV Prox   Full                                                             +---------+---------------+---------+-----------+----------+--------------+  FV Mid    Full                                                             +---------+---------------+---------+-----------+----------+--------------+  FV Distal Full                                                             +---------+---------------+---------+-----------+----------+--------------+  PFV       Full                                                             +---------+---------------+---------+-----------+----------+--------------+  POP       Full            Yes       Yes                                    +---------+---------------+---------+-----------+----------+--------------+  PTV       Full                                                             +---------+---------------+---------+-----------+----------+--------------+  PERO      Full                                                             +---------+---------------+---------+-----------+----------+--------------+   +----+---------------+---------+-----------+----------+--------------+  LEFT Compressibility Phasicity Spontaneity Properties Thrombus Aging  +----+---------------+---------+-----------+----------+--------------+  CFV  Full            Yes       Yes                                     +----+---------------+---------+-----------+----------+--------------+     Summary: RIGHT: - There is no evidence of deep vein thrombosis in the lower extremity.  - No cystic structure found in the popliteal fossa.  LEFT: - No evidence of common femoral vein obstruction.  *See table(s) above for measurements and observations. Electronically signed by Curt Jews MD on 06/28/2020 at 7:40:49 PM.    Final    VAS [place] LOWER EXTREMITY VENOUS (DVT)  Result Date: 06/11/2020  Lower Venous DVTStudy Indications: Swelling.  Risk Factors: None identified. Comparison Study: 06/07/2020 - Negative for DVT. Performing Technologist: Oliver Hum RVT  Examination Guidelines: A complete evaluation includes B-mode imaging, spectral Doppler, color Doppler, and power Doppler as needed of all accessible portions of each vessel. Bilateral testing is considered an integral part of a complete examination. Limited examinations for reoccurring indications may be performed as noted. The reflux portion of the exam is performed with the patient in reverse Trendelenburg.  +---------+---------------+---------+-----------+----------+--------------+  RIGHT     Compressibility Phasicity Spontaneity Properties Thrombus Aging  +---------+---------------+---------+-----------+----------+--------------+  CFV       Full            Yes       Yes                                    +---------+---------------+---------+-----------+----------+--------------+  SFJ       Full                                                             +---------+---------------+---------+-----------+----------+--------------+  FV Prox   Full                                                             +---------+---------------+---------+-----------+----------+--------------+  FV Mid    Full                                                             +---------+---------------+---------+-----------+----------+--------------+  FV Distal Full                                                              +---------+---------------+---------+-----------+----------+--------------+  PFV       Full                                                             +---------+---------------+---------+-----------+----------+--------------+  POP       Full            Yes       Yes                                    +---------+---------------+---------+-----------+----------+--------------+  PTV       Full                                                             +---------+---------------+---------+-----------+----------+--------------+  PERO      Full                                                             +---------+---------------+---------+-----------+----------+--------------+   +----+---------------+---------+-----------+----------+--------------+  LEFT Compressibility Phasicity Spontaneity Properties Thrombus Aging  +----+---------------+---------+-----------+----------+--------------+  CFV  Full            Yes       Yes                                    +----+---------------+---------+-----------+----------+--------------+     Summary: RIGHT: - There is no evidence of deep vein thrombosis in the lower extremity.  - No cystic structure found in the popliteal fossa.  LEFT: - No evidence of common femoral vein obstruction.  *See table(s) above for measurements and observations. Electronically signed by Deitra Mayo MD on 06/11/2020 at 6:52:41 PM.    Final    VAS [place] LOWER EXTREMITY VENOUS (DVT)  Result Date: 06/07/2020  Lower Venous DVTStudy Indications: Pain.  Risk Factors: None identified. Limitations: Poor ultrasound/tissue interface. Comparison Study: No prior studies. Performing Technologist: Oliver Hum RVT  Examination Guidelines: A complete evaluation includes B-mode imaging, spectral Doppler, color Doppler, and power Doppler as needed of all accessible portions of each vessel. Bilateral testing is considered an integral part of a complete  examination. Limited examinations for reoccurring indications may be performed as noted. The reflux portion of the exam is performed with the patient in reverse Trendelenburg.  +---------+---------------+---------+-----------+----------+--------------+  RIGHT     Compressibility Phasicity Spontaneity Properties Thrombus Aging  +---------+---------------+---------+-----------+----------+--------------+  CFV       Full            Yes       Yes                                    +---------+---------------+---------+-----------+----------+--------------+  SFJ       Full                                                             +---------+---------------+---------+-----------+----------+--------------+  FV Prox   Full                                                             +---------+---------------+---------+-----------+----------+--------------+  FV Mid    Full                                                             +---------+---------------+---------+-----------+----------+--------------+  FV Distal Full                                                             +---------+---------------+---------+-----------+----------+--------------+  PFV       Full                                                             +---------+---------------+---------+-----------+----------+--------------+  POP       Full            Yes       Yes                                    +---------+---------------+---------+-----------+----------+--------------+  PTV       Full                                                             +---------+---------------+---------+-----------+----------+--------------+  PERO      Full                                                             +---------+---------------+---------+-----------+----------+--------------+   +----+---------------+---------+-----------+----------+--------------+  LEFT Compressibility Phasicity Spontaneity Properties Thrombus Aging   +----+---------------+---------+-----------+----------+--------------+  CFV  Full            Yes       Yes                                    +----+---------------+---------+-----------+----------+--------------+     Summary: RIGHT: - There is no evidence of deep vein thrombosis in the lower extremity.  - No cystic structure found in the popliteal fossa.  LEFT: - No evidence of common femoral vein obstruction.  *See table(s) above for measurements and observations. Electronically signed by Curt Jews MD on 06/07/2020  at 3:44:25 PM.    Final    [place] EKG SITE RITE  Result Date: 06/07/2020 If Site Rite image not attached, placement could not be confirmed due to current cardiac rhythm.    Antimicrobials:   none   Subjective: No acute concerns overnight, still somewhat confused  Objective: Vitals:   07/01/20 0600 07/01/20 0800 07/01/20 0900 07/01/20 1159  BP: (!) 172/89 (!) 180/74 (!) 175/80 (!) 163/66  Pulse: 72 70 70 69  Resp: 12 14 14 20   Temp:  99 F (37.2 C)    TempSrc:  Axillary    SpO2: 98% 100% 99% 99%  Weight:      Height:        Intake/Output Summary (Last 24 hours) at 07/01/2020 1210 Last data filed at 07/01/2020 1000 Gross per 24 hour  Intake 4286.61 ml  Output 1450 ml  Net 2836.61 ml   Filed Weights   06/28/20 0635 06/28/20 2100  Weight: 68 kg 57.3 kg    Examination:  General exam: Appears calm, confused  Respiratory system: Clear to auscultation. Respiratory effort normal. Cardiovascular system: S1 & S2 heard, RRR. No JVD, murmurs, rubs, gallops or clicks. No pedal edema. Gastrointestinal system: Abdomen is nondistended, soft and nontender. No organomegaly or masses felt. Normal bowel sounds heard. Central nervous system: awakens. No focal neurological deficits. Extremities: wwp, trace edema. Skin: No rashes, lesions or ulcers Psychiatry: Judgement and insight impaiedl. Mood & affect flat    Data Reviewed: I have personally reviewed following labs and  imaging studies  CBC: Recent Labs  Lab 06/27/20 1010 06/27/20 1010 06/28/20 0722 06/28/20 1325 06/29/20 0247 06/29/20 0247 06/29/20 1328 06/30/20 0457 06/30/20 1530 06/30/20 2000 07/01/20 0500  WBC 5.8  --  5.6   < > 8.1  --  5.6  --  4.1 4.3 4.2  NEUTROABS 3.9  --  3.8  --   --   --   --   --   --   --   --   HGB 8.8*  --  8.6*   < > 9.9*   < > 8.4* 7.4* 6.2* 6.0* 9.4*  HCT 26.2*   < > 26.4*   < > 29.1*   < > 25.4* 22.2* 19.1* 18.8* 28.2*  MCV 92.3   < > 97.4   < > 94.2  --  95.5  --  96.0 96.9 92.2  PLT 183  --  178   < > 235  --  192  --  157 138* 129*   < > = values in this interval not displayed.   Basic Metabolic Panel: Recent Labs  Lab 06/27/20 1010 06/28/20 0722 06/29/20 0247 06/30/20 0457 06/30/20 1530 06/30/20 2000 07/01/20 0500  NA 138   < > 136 133* 133* 131* 132*  K 3.2*   < > 3.2* 2.9* 3.7 3.4* 3.1*  CL 103   < > 102 101 99 102 101  CO2 22   < > 23 22 22 25 25   GLUCOSE 123*   < > 74 178* 332* 209* 93  BUN 12   < > 10 7* 7* 7* 7*  CREATININE 0.74   < > 0.47* 0.49* 0.53* 0.55* 0.46*  CALCIUM 8.3*   < > 8.0* 7.7* 7.5* 7.4* 7.7*  MG 1.3*  --  2.0  --  1.4* 1.5* 1.6*   < > = values in this interval not displayed.   GFR: Estimated Creatinine Clearance: 63.7 mL/min (A) (by C-G formula based on  SCr of 0.46 mg/dL (L)). Liver Function Tests: Recent Labs  Lab 06/27/20 1010 06/28/20 0722 06/29/20 0247  AST 13* 13* 17  ALT 7 10 10   ALKPHOS 74 63 73  BILITOT 0.9 1.6* 1.5*  PROT 4.7* 4.3* 5.0*  ALBUMIN 2.3* 2.2* 2.6*   No results for input(s): LIPASE, AMYLASE in the last 168 hours. No results for input(s): AMMONIA in the last 168 hours. Coagulation Profile: Recent Labs  Lab 06/28/20 0722  INR 1.1   Cardiac Enzymes: No results for input(s): CKTOTAL, CKMB, CKMBINDEX, TROPONINI in the last 168 hours. BNP (last 3 results) No results for input(s): PROBNP in the last 8760 hours. HbA1C: No results for input(s): HGBA1C in the last 72  hours. CBG: Recent Labs  Lab 06/30/20 1430 06/30/20 1620 06/30/20 2115 07/01/20 0026 07/01/20 0751  GLUCAP 265* 277* 206* 114* 92   Lipid Profile: No results for input(s): CHOL, HDL, LDLCALC, TRIG, CHOLHDL, LDLDIRECT in the last 72 hours. Thyroid Function Tests: No results for input(s): TSH, T4TOTAL, FREET4, T3FREE, THYROIDAB in the last 72 hours. Anemia Panel: No results for input(s): VITAMINB12, FOLATE, FERRITIN, TIBC, IRON, RETICCTPCT in the last 72 hours. Sepsis Labs: No results for input(s): PROCALCITON, LATICACIDVEN in the last 168 hours.  Recent Results (from the past 240 hour(s))  SARS Coronavirus 2 by RT PCR (hospital order, performed in [facility] hospital lab) Nasopharyngeal Nasopharyngeal Swab     Status: None   Collection Time: 06/28/20  1:38 PM   Specimen: Nasopharyngeal Swab  Result Value Ref Range Status   SARS Coronavirus 2 NEGATIVE NEGATIVE Final    Comment: (NOTE) SARS-CoV-2 target nucleic acids are NOT DETECTED.  The SARS-CoV-2 RNA is generally detectable in upper and lower respiratory specimens during the acute phase of infection. The lowest concentration of SARS-CoV-2 viral copies this assay can detect is 250 copies / mL. A negative result does not preclude SARS-CoV-2 infection and should not be used as the sole basis for treatment or other patient management decisions.  A negative result may occur with improper specimen collection / handling, submission of specimen other than nasopharyngeal swab, presence of viral mutation(s) within the areas targeted by this assay, and inadequate number of viral copies (<250 copies / mL). A negative result must be combined with clinical observations, patient history, and epidemiological information.  Fact Sheet for Patients:   StrictlyIdeas.no  Fact Sheet for Healthcare Providers: BankingDealers.co.za  This test is not yet approved or  cleared by the [place]  FDA and has been authorized for detection and/or diagnosis of SARS-CoV-2 by FDA under an Emergency Use Authorization (EUA).  This EUA will remain in effect (meaning this test can be used) for the duration of the COVID-19 declaration under Section 564(b)(1) of the Act, 21 U.S.C. section 360bbb-3(b)(1), unless the authorization is terminated or revoked sooner.  Performed at [facility], [place] [address] [place], [place] [address]   MRSA PCR Screening     Status: None   Collection Time: 06/28/20  8:54 PM   Specimen: Nasal Mucosa; Nasopharyngeal  Result Value Ref Range Status   MRSA by PCR NEGATIVE NEGATIVE Final    Comment:        The GeneXpert MRSA Assay (FDA approved for NASAL specimens only), is one component of a comprehensive MRSA colonization surveillance program. It is not intended to diagnose MRSA infection nor to guide or monitor treatment for MRSA infections. Performed at [facility], [place] [address], [place], [place] [address]  Radiology Studies: DG CHEST PORT 1 VIEW  Result Date: 07/01/2020 CLINICAL DATA:  79 year old male with acute respiratory failure EXAM: PORTABLE CHEST 1 VIEW COMPARISON:  06/30/2020 FINDINGS: Cardiomediastinal silhouette unchanged in size and contour. Similar appearance of architectural distortion of the right greater than left lungs. No pneumothorax. No large pleural effusion. Coarsened interstitial markings persist with no new confluent airspace disease. Interval slight withdrawal of the left IJ central venous catheter with the tip appearing to terminate superior vena cava. IMPRESSION: Similar appearance of the lungs, with chronic lung changes, scarring/atelectasis and no new confluent airspace disease. Interval slight withdrawal of left IJ central venous catheter. Electronically Signed   By: Corrie Mckusick D.O.   On: 07/01/2020 08:54   DG Chest Port 1 View  Result Date: 06/30/2020 CLINICAL DATA:   Central line placement. EXAM: PORTABLE CHEST 1 VIEW COMPARISON:  June 10, 2020. FINDINGS: The heart size and mediastinal contours are within normal limits. No pneumothorax is noted. Left lung is clear. Mild right basilar subsegmental atelectasis is noted. Interval placement of left internal jugular catheter with distal tip in expected position of right atrium. The visualized skeletal structures are unremarkable. IMPRESSION: Interval placement of left internal jugular catheter with distal tip in expected position of right atrium. Withdrawal by 3-4 cm is recommended. Mild right basilar subsegmental atelectasis is noted. Electronically Signed   By: Marijo Conception M.D.   On: 06/30/2020 16:10        Scheduled Meds:  sodium chloride   Intravenous Once   sodium chloride   Intravenous Once   amiodarone  150 mg Intravenous Once   bisacodyl  10 mg Rectal Daily   carvedilol  12.5 mg Oral BID WC   chlorhexidine  15 mL Mouth Rinse BID   Chlorhexidine Gluconate Cloth  6 each Topical Daily   divalproex  125 mg Oral Q12H   feeding supplement (KATE FARMS STANDARD 1.4)  325 mL Oral BID BM   hydrocortisone cream   Topical TID   insulin aspart  0-5 Units Subcutaneous QHS   insulin aspart  0-9 Units Subcutaneous TID WC   insulin glargine  6 Units Subcutaneous QHS   mouth rinse  15 mL Mouth Rinse q12n4p   multivitamin with minerals  1 tablet Oral Daily   polyethylene glycol  17 g Oral BID   rosuvastatin  5 mg Oral Daily   sorbitol, milk of mag, mineral oil, glycerin (SMOG) enema  960 mL Rectal Once   Continuous Infusions:  amiodarone 30 mg/hr (07/01/20 1156)   lactated ringers 75 mL/hr at 07/01/20 1032     LOS: 3 days    Time spent: 45 min of critical care time    Nicolette Bang, MD [facility]  If 7PM-7AM, please contact night-coverage  07/01/2020, 12:10 PM

## 2020-07-01 NOTE — Progress Notes (Signed)
CCM service sign off   S:no sob/ cp/ cough / abd pain   Scheduled Meds: . sodium chloride   Intravenous Once  . sodium chloride   Intravenous Once  . amiodarone  150 mg Intravenous Once  . bisacodyl  10 mg Rectal Daily  . carvedilol  12.5 mg Oral BID WC  . chlorhexidine  15 mL Mouth Rinse BID  . Chlorhexidine Gluconate Cloth  6 each Topical Daily  . divalproex  125 mg Oral Q12H  . feeding supplement (KATE FARMS STANDARD 1.4)  325 mL Oral BID BM  . hydrocortisone cream   Topical TID  . insulin aspart  0-5 Units Subcutaneous QHS  . insulin aspart  0-9 Units Subcutaneous TID WC  . insulin glargine  6 Units Subcutaneous QHS  . mouth rinse  15 mL Mouth Rinse q12n4p  . multivitamin with minerals  1 tablet Oral Daily  . polyethylene glycol  17 g Oral BID  . rosuvastatin  5 mg Oral Daily  . sorbitol, milk of mag, mineral oil, glycerin (SMOG) enema  960 mL Rectal Once   Continuous Infusions: . amiodarone 30 mg/hr (06/30/20 2337)  . lactated ringers 75 mL/hr at 06/30/20 1849  . phenylephrine (NEO-SYNEPHRINE) Adult infusion Stopped (06/30/20 1600)   PRN Meds:.acetaminophen **OR** acetaminophen, dextrose     0: Blood pressure (!) 175/80, pulse 70, temperature 99 F (37.2 C), temperature source Axillary, resp. rate 14, height 5\' 7"  (1.702 m), weight 57.3 kg, SpO2 99 %. CVP:  [1 mmHg-6 mmHg] 1 mmHg  Filed Weights   06/28/20 0635 06/28/20 2100  Weight: 68 kg 57.3 kg    Intake/Output from previous day:  Intake/Output Summary (Last 24 hours) at 07/01/2020 0955 Last data filed at 07/01/2020 0515 Gross per 24 hour  Intake 2663.66 ml  Output 1250 ml  Net 1413.66 ml        On my exam  Tmax 99.7  Pt alert, approp nad @ 30 degrees RA No jvd Oropharynx clear,  mucosa nl Neck supple Lungs slt barrel chest, clar bilaterally RRR no s3 or or sign murmur Abd soft/ nl  excursion  Extr warm with no edema or clubbing noted Neuro  Sensorium intact,  no apparent motor deficits    pcxr  07/01/2020  Similar appearance of the lungs, with chronic lung changes, scarring/atelectasis and no new confluent airspace disease.  Interval slight withdrawal of left IJ central venous catheter.  Interim Data: Lab Results  Component Value Date   WBC 4.2 07/01/2020   HGB 9.4 (L) 07/01/2020   HCT 28.2 (L) 07/01/2020   MCV 92.2 07/01/2020   PLT 129 (L) 07/01/2020   Lab Results  Component Value Date   NA 132 (L) 07/01/2020   K 3.1 (L) 07/01/2020   CL 101 07/01/2020   CO2 25 07/01/2020   GLUCOSE 93 07/01/2020   BUN 7 (L) 07/01/2020   CREATININE 0.46 (L) 07/01/2020   CALCIUM 7.7 (L) 07/01/2020       Impression/Plan: 1) shock related to Rapid afib, resolved > back in SR and likely euvolemic   F/b cards  2 ) anemia s/p GIB, resolved for now   Lab Results  Component Value Date   HGB 9.4 (L) 07/01/2020   HGB 6.0 (LL) 06/30/2020   HGB 6.2 (LL) 06/30/2020   HGB 8.8 (L) 06/27/2020   HGB 8.0 (L) 06/23/2020   HGB 10.8 (L) 06/10/2020   HGB 9.8 (L) 06/05/2020   HGB 13.2 11/05/2017   HGB 13.3 08/06/2017  HGB 12.5 (L) 05/07/2017    F/b GI   PCCM service available prn - primary service = [facility]    Christinia Gully, MD Pulmonary and [place]   After 5:30 PM or weekends, use [facility] [phone]

## 2020-07-01 NOTE — Progress Notes (Addendum)
Progress Note  Patient Name: Tony Marshall Date of Encounter: 07/01/2020  Primary Cardiologist: Skeet Latch, MD   Subjective   Converted back to NSR.  Denies any chest pain or SOB.    Inpatient Medications    Scheduled Meds: . sodium chloride   Intravenous Once  . sodium chloride   Intravenous Once  . amiodarone  150 mg Intravenous Once  . bisacodyl  10 mg Rectal Daily  . chlorhexidine  15 mL Mouth Rinse BID  . Chlorhexidine Gluconate Cloth  6 each Topical Daily  . divalproex  125 mg Oral Q12H  . feeding supplement (KATE FARMS STANDARD 1.4)  325 mL Oral BID BM  . hydrocortisone cream   Topical TID  . insulin aspart  0-5 Units Subcutaneous QHS  . insulin aspart  0-9 Units Subcutaneous TID WC  . insulin glargine  6 Units Subcutaneous QHS  . mouth rinse  15 mL Mouth Rinse q12n4p  . multivitamin with minerals  1 tablet Oral Daily  . polyethylene glycol  17 g Oral BID  . rosuvastatin  5 mg Oral Daily  . sorbitol, milk of mag, mineral oil, glycerin (SMOG) enema  960 mL Rectal Once   Continuous Infusions: . amiodarone 30 mg/hr (06/30/20 2337)  . lactated ringers 75 mL/hr at 06/30/20 1849  . phenylephrine (NEO-SYNEPHRINE) Adult infusion Stopped (06/30/20 1600)   PRN Meds: acetaminophen **OR** acetaminophen, dextrose   Vital Signs    Vitals:   07/01/20 0400 07/01/20 0500 07/01/20 0600 07/01/20 0800  BP: (!) 158/74 (!) 166/63 (!) 172/89   Pulse: 73 72 72   Resp: (!) 26 15 12    Temp: 99.4 F (37.4 C)   99 F (37.2 C)  TempSrc: Axillary   Axillary  SpO2: 100% 99% 98%   Weight:      Height:        Intake/Output Summary (Last 24 hours) at 07/01/2020 0908 Last data filed at 07/01/2020 0515 Gross per 24 hour  Intake 2663.66 ml  Output 1250 ml  Net 1413.66 ml   Filed Weights   06/28/20 0635 06/28/20 2100  Weight: 68 kg 57.3 kg    Telemetry    NSR - Personally Reviewed  ECG    Atrial fibrillation with RVR and nonspecific ST abnormality - Personally  Reviewed  Physical Exam   GEN: No acute distress.   Neck: No JVD Cardiac: RRR, no murmurs, rubs, or gallops.  Respiratory: Clear to auscultation bilaterally. GI: Soft, nontender, non-distended  MS: No edema; No deformity. Neuro:  Nonfocal  Psych: Normal affect   Labs    Chemistry Recent Labs  Lab 06/27/20 1010 06/27/20 1010 06/28/20 0722 06/28/20 1325 06/29/20 0247 06/30/20 0457 06/30/20 1530 06/30/20 2000 07/01/20 0500  NA 138   < > 137   < > 136   < > 133* 131* 132*  K 3.2*   < > 3.2*   < > 3.2*   < > 3.7 3.4* 3.1*  CL 103   < > 100   < > 102   < > 99 102 101  CO2 22   < > 18*   < > 23   < > 22 25 25   GLUCOSE 123*   < > 252*   < > 74   < > 332* 209* 93  BUN 12   < > 12   < > 10   < > 7* 7* 7*  CREATININE 0.74  --  0.70   < > 0.47*   < >  0.53* 0.55* 0.46*  CALCIUM 8.3*   < > 8.2*   < > 8.0*   < > 7.5* 7.4* 7.7*  PROT 4.7*  --  4.3*  --  5.0*  --   --   --   --   ALBUMIN 2.3*  --  2.2*  --  2.6*  --   --   --   --   AST 13*  --  13*  --  17  --   --   --   --   ALT 7  --  10  --  10  --   --   --   --   ALKPHOS 74  --  63  --  73  --   --   --   --   BILITOT 0.9  --  1.6*  --  1.5*  --   --   --   --   GFRNONAA >60  --  >60   < > >60   < > >60 >60 >60  GFRAA >60  --  >60   < > >60   < > >60 >60 >60  ANIONGAP 13   < > 19*   < > 11   < > 12 4* 6   < > = values in this interval not displayed.     Hematology Recent Labs  Lab 06/30/20 1530 06/30/20 2000 07/01/20 0500  WBC 4.1 4.3 4.2  RBC 1.99* 1.94* 3.06*  HGB 6.2* 6.0* 9.4*  HCT 19.1* 18.8* 28.2*  MCV 96.0 96.9 92.2  MCH 31.2 30.9 30.7  MCHC 32.5 31.9 33.3  RDW 18.6* 18.6* 17.6*  PLT 157 138* 129*    Cardiac EnzymesNo results for input(s): TROPONINI in the last 168 hours. No results for input(s): TROPIPOC in the last 168 hours.   BNPNo results for input(s): BNP, PROBNP in the last 168 hours.   DDimer No results for input(s): DDIMER in the last 168 hours.   Radiology    DG CHEST PORT 1  VIEW  Result Date: 07/01/2020 CLINICAL DATA:  79 year old male with acute respiratory failure EXAM: PORTABLE CHEST 1 VIEW COMPARISON:  06/30/2020 FINDINGS: Cardiomediastinal silhouette unchanged in size and contour. Similar appearance of architectural distortion of the right greater than left lungs. No pneumothorax. No large pleural effusion. Coarsened interstitial markings persist with no new confluent airspace disease. Interval slight withdrawal of the left IJ central venous catheter with the tip appearing to terminate superior vena cava. IMPRESSION: Similar appearance of the lungs, with chronic lung changes, scarring/atelectasis and no new confluent airspace disease. Interval slight withdrawal of left IJ central venous catheter. Electronically Signed   By: Corrie Mckusick D.O.   On: 07/01/2020 08:54   DG Chest Port 1 View  Result Date: 06/30/2020 CLINICAL DATA:  Central line placement. EXAM: PORTABLE CHEST 1 VIEW COMPARISON:  June 10, 2020. FINDINGS: The heart size and mediastinal contours are within normal limits. No pneumothorax is noted. Left lung is clear. Mild right basilar subsegmental atelectasis is noted. Interval placement of left internal jugular catheter with distal tip in expected position of right atrium. The visualized skeletal structures are unremarkable. IMPRESSION: Interval placement of left internal jugular catheter with distal tip in expected position of right atrium. Withdrawal by 3-4 cm is recommended. Mild right basilar subsegmental atelectasis is noted. Electronically Signed   By: Marijo Conception M.D.   On: 06/30/2020 16:10    Cardiac Studies   2D ech IMPRESSIONS  1. Left ventricular ejection fraction, by estimation, is 25 to 30%. The  left ventricle has severely decreased function. The left ventricle has no  regional wall motion abnormalities. Left ventricular diastolic parameters  are consistent with Grade II  diastolic dysfunction (pseudonormalization).  2. Right  ventricular systolic function is normal. The right ventricular  size is normal. There is normal pulmonary artery systolic pressure.  3. The mitral valve is normal in structure. No evidence of mitral valve  regurgitation. No evidence of mitral stenosis.  4. The aortic valve is grossly normal. Aortic valve regurgitation is not  visualized. No aortic stenosis is present.  5. The inferior vena cava is normal in size with greater than 50%  respiratory variability, suggesting right atrial pressure of 3 mmHg.   Patient Profile     79 y.o. male with a history of CLL, DM, OSA, HTN, HLD, GERD, schatzkis's ring who is being seen for the evaluation of atrial fibrillation at the request of Dr. Bryan Lemma.  Assessment & Plan    1.  Atrial Fibrillation with RVR -this occurred in setting of DKA, Hypokalemia (K+ 2.9) and GIB with Hbg 7.4. -he has converted to NSR with IV Amio -not on anticoagulation due to GIB -will continue on IV Amio for now until acute illness improves  2.  Acute GIB -? From sterdoral colitis with fecal impaction -Hbg dropped to 6 but now 9.4 this am after PRBCs -per TRH and GI  3.  DKA -per TRH  4.  HTN -BP elevated at 172/33mmHg -home Entresto and Carvedilol on hold due to hypotension with afib with RVR -restart Carvedilol 12.5mg  BID  5.  Chronic combined systolic/diastolic CHF -EF 03-88% -appears well compensated on exam with no volume overload -Entresto and Carvedilol on hold -restart Carvedilol 12.5mg  BID -restart Entresto if Bp stable after restarting carvedilol  I have spent a total of 35 minutes with patient reviewing 2D echo , telemetry, EKGs, labs and examining patient as well as establishing an assessment and plan that was discussed with the patient.  > 50% of time was spent in direct patient care.      For questions or updates, please contact [place] Please consult [email or web address] for contact info under Cardiology/STEMI.      Signed, Fransico Him, MD  07/01/2020, 9:08 AM

## 2020-07-01 NOTE — Progress Notes (Signed)
   Patient Name: KEMUEL BUCHMANN Date of Encounter: 07/01/2020, 10:57 AM    Subjective  [place] [facility] BM RN says rectal vaut still w/ sig formed stool - not hard Stimulant suppos given - no BM so far   Objective  BP (!) 175/80   Pulse 70   Temp 99 F (37.2 C) (Axillary)   Resp 14   Ht 5\' 7"  (1.702 m)   Wt 57.3 kg   SpO2 99%   BMI 19.79 kg/m  NAD  CBC Latest Ref Rng & Units 07/01/2020 06/30/2020 06/30/2020  WBC 4.0 - 10.5 K/uL 4.2 4.3 4.1  Hemoglobin 13.0 - 17.0 g/dL 9.4(L) 6.0(LL) 6.2(LL)  Hematocrit 39 - 52 % 28.2(L) 18.8(L) 19.1(L)  Platelets 150 - 400 K/uL 129(L) 138(L) 157   Got 2 U RBC    Assessment and Plan  Rectal bleeding Fecal impaction Suspected obstipation/overflkow and ? Rectal ulcer or hemorrhoidal bleeding - clinical dx   Better overall Check KUB May need another enema  Gatha Mayer, MD, [facility] 07/01/2020 10:57 AM

## 2020-07-02 DIAGNOSIS — I420 Dilated cardiomyopathy: Secondary | ICD-10-CM

## 2020-07-02 DIAGNOSIS — I4891 Unspecified atrial fibrillation: Secondary | ICD-10-CM | POA: Diagnosis not present

## 2020-07-02 DIAGNOSIS — E1110 Type 2 diabetes mellitus with ketoacidosis without coma: Secondary | ICD-10-CM | POA: Diagnosis not present

## 2020-07-02 DIAGNOSIS — I5042 Chronic combined systolic (congestive) and diastolic (congestive) heart failure: Secondary | ICD-10-CM | POA: Diagnosis not present

## 2020-07-02 DIAGNOSIS — I10 Essential (primary) hypertension: Secondary | ICD-10-CM | POA: Diagnosis not present

## 2020-07-02 LAB — BASIC METABOLIC PANEL
Anion gap: 9 (ref 5–15)
BUN: 9 mg/dL (ref 8–23)
CO2: 25 mmol/L (ref 22–32)
Calcium: 7.7 mg/dL — ABNORMAL LOW (ref 8.9–10.3)
Chloride: 101 mmol/L (ref 98–111)
Creatinine, Ser: 0.49 mg/dL — ABNORMAL LOW (ref 0.61–1.24)
GFR calc Af Amer: >60 mL/min (ref >60)
GFR calc non Af Amer: >60 mL/min (ref >60)
Glucose, Bld: 212 mg/dL — ABNORMAL HIGH (ref 70–99)
Potassium: 3.6 mmol/L (ref 3.5–5.1)
Sodium: 135 mmol/L (ref 135–145)

## 2020-07-02 LAB — GLUCOSE, CAPILLARY
Glucose-Capillary: 158 mg/dL — ABNORMAL HIGH (ref 70–99)
Glucose-Capillary: 97 mg/dL (ref 70–99)

## 2020-07-02 LAB — MAGNESIUM: Magnesium: 1.7 mg/dL (ref 1.7–2.4)

## 2020-07-02 MED ORDER — MAGNESIUM SULFATE 2 GM/50ML IV SOLN
2.0000 g | Freq: Once | INTRAVENOUS | Status: AC
  Administered 2020-07-02: 2 g via INTRAVENOUS
  Filled 2020-07-02: qty 50

## 2020-07-02 MED ORDER — SODIUM CHLORIDE 0.9% FLUSH
10.0000 mL | Freq: Two times a day (BID) | INTRAVENOUS | Status: DC
  Administered 2020-07-02 – 2020-07-11 (×15): 10 mL

## 2020-07-02 MED ORDER — SODIUM CHLORIDE 0.9% FLUSH: 10.0000 mL | INTRAVENOUS | Status: DC | PRN

## 2020-07-02 MED ORDER — SORBITOL 70 % SOLN
960.0000 mL | TOPICAL_OIL | Freq: Once | ORAL | Status: AC
  Administered 2020-07-02: 960 mL via RECTAL
  Filled 2020-07-02: qty 473

## 2020-07-02 MED ORDER — SACUBITRIL-VALSARTAN 24-26 MG PO TABS
1.0000 | ORAL_TABLET | Freq: Two times a day (BID) | ORAL | Status: DC
  Administered 2020-07-02 – 2020-07-04 (×4): 1 via ORAL
  Filled 2020-07-02 (×4): qty 1

## 2020-07-02 NOTE — Progress Notes (Deleted)
Cardiology Office Note   Date:  07/02/2020   ID:  Tony Marshall, DOB February 07, 1941, MRN [id number]  PCP:  Elayne Snare, MD  Cardiologist:  No chief complaint on file.    History of Present Illness: Tony Marshall is a 79 y.o. male who presents for     Past Medical History:  Diagnosis Date  . A-fib ([place])   . CLL (chronic lymphocytic leukemia) ([place]) dx'd 2014   progression 12/2019  . Diabetes mellitus without complication ([place])   . Diverticulosis   . External hemorrhoids   . GERD (gastroesophageal reflux disease)   . Hiatal hernia   . History of colon polyps   . Hyperlipidemia   . Hypertension   . Internal hemorrhoids   . Lymphocytosis   . Schatzki's ring   . Sleep apnea     Past Surgical History:  Procedure Laterality Date  . BLEPHAROPLASTY    . COLONOSCOPY  2012  . HEMORRHOID SURGERY     Sclerotherapy  . KNEE SURGERY Right 2002  . SHOULDER SURGERY Right 2007     No current facility-administered medications for this visit.   No current outpatient medications on file.   Facility-Administered Medications Ordered in Other Visits  Medication Dose Route Frequency Provider Last Rate Last Admin  . 0.9 %  sodium chloride infusion (Manually program via Guardrails IV Fluids)   Intravenous Once Guilford Shi, MD   Held at 06/30/20 1551  . 0.9 %  sodium chloride infusion (Manually program via Guardrails IV Fluids)   Intravenous Once Mannam, Praveen, MD   Held at 06/30/20 1710  . acetaminophen (TYLENOL) tablet 650 mg  650 mg Oral Q6H PRN Erick Colace, NP   650 mg at 07/01/20 1258   Or  . acetaminophen (TYLENOL) suppository 650 mg  650 mg Rectal Q6H PRN Erick Colace, NP      . amiodarone (NEXTERONE) 1.8 mg/mL load via infusion 150 mg  150 mg Intravenous Once Erick Colace, NP       Followed by  . amiodarone (NEXTERONE PREMIX) 360-4.14 MG/200ML-% (1.8 mg/mL) IV infusion  30 mg/hr Intravenous Continuous Erick Colace, NP 16.67 mL/hr at 07/02/20 0800 30  mg/hr at 07/02/20 0800  . bisacodyl (DULCOLAX) suppository 10 mg  10 mg Rectal Daily Erick Colace, NP   10 mg at 07/01/20 1884  . carvedilol (COREG) tablet 12.5 mg  12.5 mg Oral BID WC Turner, Traci R, MD   12.5 mg at 07/02/20 1031  . chlorhexidine (PERIDEX) 0.12 % solution 15 mL  15 mL Mouth Rinse BID Erick Colace, NP   15 mL at 07/02/20 1035  . Chlorhexidine Gluconate Cloth 2 % PADS 6 each  6 each Topical Daily Erick Colace, NP   6 each at 07/02/20 1032  . dextrose 50 % solution 0-50 mL  0-50 mL Intravenous PRN Erick Colace, NP      . divalproex (DEPAKOTE) DR tablet 125 mg  125 mg Oral Q12H Erick Colace, NP   125 mg at 07/02/20 1031  . feeding supplement (KATE FARMS STANDARD 1.4) liquid 325 mL  325 mL Oral BID BM Erick Colace, NP   325 mL at 07/01/20 0935  . hydrocortisone cream 1 %   Topical TID Erick Colace, NP   Given at 07/02/20 1034  . insulin aspart (novoLOG) injection 0-5 Units  0-5 Units Subcutaneous QHS Erick Colace, NP   2 Units at 07/01/20 2231  . insulin  aspart (novoLOG) injection 0-9 Units  0-9 Units Subcutaneous TID WC Erick Colace, NP   2 Units at 07/02/20 1032  . insulin glargine (LANTUS) injection 6 Units  6 Units Subcutaneous QHS Guilford Shi, MD   6 Units at 07/01/20 2231  . lactated ringers infusion   Intravenous Continuous Erick Colace, NP 75 mL/hr at 07/02/20 0800 Rate Verify at 07/02/20 0800  . magnesium sulfate IVPB 2 g 50 mL  2 g Intravenous Once Marcell Anger, MD 50 mL/hr at 07/02/20 1030 2 g at 07/02/20 1030  . MEDLINE mouth rinse  15 mL Mouth Rinse q12n4p Erick Colace, NP   15 mL at 07/01/20 1737  . multivitamin with minerals tablet 1 tablet  1 tablet Oral Daily Erick Colace, NP   1 tablet at 07/02/20 1033  . polyethylene glycol (MIRALAX / GLYCOLAX) packet 17 g  17 g Oral BID Erick Colace, NP   17 g at 07/02/20 1029  . rosuvastatin (CRESTOR) tablet 5 mg  5 mg Oral Daily Erick Colace, NP   5 mg at  07/02/20 1033  . sodium chloride flush (NS) 0.9 % injection 10-40 mL  10-40 mL Intracatheter Q12H Spongberg, Audie Pinto, MD   10 mL at 07/02/20 1034  . sodium chloride flush (NS) 0.9 % injection 10-40 mL  10-40 mL Intracatheter PRN Spongberg, Audie Pinto, MD      . sorbitol, milk of mag, mineral oil, glycerin (SMOG) enema  960 mL Rectal Once Erick Colace, NP      . traMADol Veatrice Bourbon) tablet 50 mg  50 mg Oral Q6H PRN Marcell Anger, MD   50 mg at 07/02/20 0243    Allergies:   Glycopyrrolate    Social History:  The patient  reports that he has never smoked. He quit smokeless tobacco use about 7 years ago.  His smokeless tobacco use included chew. He reports current alcohol use of about 6.0 standard drinks of alcohol per week. He reports that he does not use drugs.   Family History:  The patient's family history includes CAD in his maternal aunt; Kidney disease in his mother.    ROS: All other systems are reviewed and negative. Unless otherwise mentioned in H&P    PHYSICAL EXAM: VS:  There were no vitals taken for this visit. , BMI There is no height or weight on file to calculate BMI. GEN: Well nourished, well developed, in no acute distress HEENT: normal Neck: no JVD, carotid bruits, or masses Cardiac: ***RRR; no murmurs, rubs, or gallops,no edema  Respiratory:  Clear to auscultation bilaterally, normal work of breathing GI: soft, nontender, nondistended, + BS MS: no deformity or atrophy Skin: warm and dry, no rash Neuro:  Strength and sensation are intact Psych: euthymic mood, full affect   EKG:  EKG {ACTION; IS/IS EHU:[id number]} ordered today. The ekg ordered today demonstrates ***   Recent Labs: 06/06/2020: TSH 0.200 06/29/2020: ALT 10 07/01/2020: Hemoglobin 9.4; Platelets 150 07/02/2020: BUN 9; Creatinine, Ser 0.49; Magnesium 1.7; Potassium 3.6; Sodium 135    Lipid Panel    Component Value Date/Time   CHOL 98 06/10/2020 0432   TRIG 160 (H) 06/10/2020  0432   HDL 5 (L) 06/10/2020 0432   CHOLHDL 19.6 06/10/2020 0432   VLDL 32 06/10/2020 0432   LDLCALC 61 06/10/2020 0432      Wt Readings from Last 3 Encounters:  06/28/20 126 lb 5.2 oz (57.3 kg)  06/17/20 141 lb 1.5 oz (64  kg)  05/30/20 124 lb 6.4 oz (56.4 kg)      Other studies Reviewed: Additional studies/ records that were reviewed today include: ***. Review of the above records demonstrates: ***   ASSESSMENT AND PLAN:  1.  ***   Current medicines are reviewed at length with the patient today.  I have spent *** dedicated to the care of this patient on the date of this encounter to include pre-visit review of records, assessment, management and diagnostic testing,with shared decision making.  Labs/ tests ordered today include: *** Phill Myron. West Pugh, ANP, [facility]   07/02/2020 10:42 AM    [facility] [place] [address] Office [phone] Fax [phone]  Notice: This dictation was prepared with Dragon dictation along with smaller phrase technology. Any transcriptional errors that result from this process are unintentional and may not be corrected upon review.

## 2020-07-02 NOTE — Progress Notes (Signed)
   Patient Name: Tony Marshall Date of Encounter: 07/02/2020, 8:40 AM    Subjective  Comfortable No bleeding 9 stools reported - still loose   Objective  BP (!) 154/72   Pulse 68   Temp 98.9 F (37.2 C) (Axillary)   Resp 14   Ht 5\' 7"  (1.702 m)   Wt 57.3 kg   SpO2 98%   BMI 19.79 kg/m  abd soft and NT  CBC Latest Ref Rng & Units 07/01/2020 07/01/2020 06/30/2020  WBC 4.0 - 10.5 K/uL 3.7(L) 4.2 4.3  Hemoglobin 13.0 - 17.0 g/dL 9.4(L) 9.4(L) 6.0(LL)  Hematocrit 39 - 52 % 28.6(L) 28.2(L) 18.8(L)  Platelets 150 - 400 K/uL 150 129(L) 138(L)    DG Abd Portable 1V CLINICAL DATA:  Fecal impaction.  EXAM: PORTABLE ABDOMEN - 1 VIEW  COMPARISON:  February 02, 2018.  FINDINGS: No abnormal bowel dilatation is noted. Mild amount of stool is noted in the rectum. No radio-opaque calculi or other significant radiographic abnormality are seen.  IMPRESSION: Mild stool burden is noted. No evidence of bowel obstruction or ileus.  Electronically Signed   By: Marijo Conception M.D.   On: 07/01/2020 15:34 DG CHEST PORT 1 VIEW CLINICAL DATA:  79 year old male with acute respiratory failure  EXAM: PORTABLE CHEST 1 VIEW  COMPARISON:  06/30/2020  FINDINGS: Cardiomediastinal silhouette unchanged in size and contour.  Similar appearance of architectural distortion of the right greater than left lungs.  No pneumothorax. No large pleural effusion. Coarsened interstitial markings persist with no new confluent airspace disease.  Interval slight withdrawal of the left IJ central venous catheter with the tip appearing to terminate superior vena cava.  IMPRESSION: Similar appearance of the lungs, with chronic lung changes, scarring/atelectasis and no new confluent airspace disease.  Interval slight withdrawal of left IJ central venous catheter.  Electronically Signed   By: Corrie Mckusick D.O.   On: 07/01/2020 08:54     Assessment and Plan  Rectal bleeding and decreased  Hgb - bleeding has stopped and Hgb better after 2 URBC  Diarrhea - though is overflow w/ fecal impaction - SMOG enema today and reassess tomorrow  Gatha Mayer, MD, [facility] 07/02/2020 8:40 AM

## 2020-07-02 NOTE — Progress Notes (Signed)
Progress Note  Patient Name: Tony Marshall Date of Encounter: 07/02/2020  Primary Cardiologist: Skeet Latch, MD   Subjective   Denies any chest pain or SOB.  Maintaining NSR On IV Amio.  Inpatient Medications    Scheduled Meds: . sodium chloride   Intravenous Once  . sodium chloride   Intravenous Once  . amiodarone  150 mg Intravenous Once  . bisacodyl  10 mg Rectal Daily  . carvedilol  12.5 mg Oral BID WC  . chlorhexidine  15 mL Mouth Rinse BID  . Chlorhexidine Gluconate Cloth  6 each Topical Daily  . divalproex  125 mg Oral Q12H  . feeding supplement ([employer] STANDARD 1.4)  325 mL Oral BID BM  . hydrocortisone cream   Topical TID  . insulin aspart  0-5 Units Subcutaneous QHS  . insulin aspart  0-9 Units Subcutaneous TID WC  . insulin glargine  6 Units Subcutaneous QHS  . mouth rinse  15 mL Mouth Rinse q12n4p  . multivitamin with minerals  1 tablet Oral Daily  . polyethylene glycol  17 g Oral BID  . rosuvastatin  5 mg Oral Daily  . sorbitol, milk of mag, mineral oil, glycerin (SMOG) enema  960 mL Rectal Once   Continuous Infusions: . amiodarone 30 mg/hr (07/02/20 0600)  . lactated ringers 75 mL/hr at 07/02/20 0600  . magnesium sulfate bolus IVPB     PRN Meds: acetaminophen **OR** acetaminophen, dextrose, traMADol   Vital Signs    Vitals:   07/02/20 0200 07/02/20 0400 07/02/20 0500 07/02/20 0600  BP:      Pulse: 72 65 67 68  Resp: 16 15 16 14   Temp:  98.9 F (37.2 C)    TempSrc:  Axillary    SpO2: 97% 98% 98% 98%  Weight:      Height:        Intake/Output Summary (Last 24 hours) at 07/02/2020 0818 Last data filed at 07/02/2020 0600 Gross per 24 hour  Intake 3914.68 ml  Output 750 ml  Net 3164.68 ml   Filed Weights   06/28/20 0635 06/28/20 2100  Weight: 68 kg 57.3 kg    Telemetry    NSR - Personally Reviewed  ECG    No new EKG to review - Personally Reviewed  Physical Exam   GEN: No acute distress.   Neck: No JVD Cardiac:  RRR, no murmurs, rubs, or gallops.  Respiratory: Clear to auscultation bilaterally. GI: Soft, nontender, non-distended  MS: No edema; No deformity. Neuro:  Nonfocal  Psych: Normal affect   Labs    Chemistry Recent Labs  Lab 06/27/20 1010 06/27/20 1010 06/28/20 0722 06/28/20 1325 06/29/20 0247 06/30/20 0457 07/01/20 0500 07/01/20 1920 07/02/20 0430  NA 138   < > 137   < > 136   < > 132* 133* 135  K 3.2*   < > 3.2*   < > 3.2*   < > 3.1* 3.0* 3.6  CL 103   < > 100   < > 102   < > 101 103 101  CO2 22   < > 18*   < > 23   < > 25 25 25   GLUCOSE 123*   < > 252*   < > 74   < > 93 196* 212*  BUN 12   < > 12   < > 10   < > 7* 9 9  CREATININE 0.74  --  0.70   < > 0.47*   < >  0.46* 0.60* 0.49*  CALCIUM 8.3*   < > 8.2*   < > 8.0*   < > 7.7* 7.5* 7.7*  PROT 4.7*  --  4.3*  --  5.0*  --   --   --   --   ALBUMIN 2.3*  --  2.2*  --  2.6*  --   --   --   --   AST 13*  --  13*  --  17  --   --   --   --   ALT 7  --  10  --  10  --   --   --   --   ALKPHOS 74  --  63  --  73  --   --   --   --   BILITOT 0.9  --  1.6*  --  1.5*  --   --   --   --   GFRNONAA >60  --  >60   < > >60   < > >60 >60 >60  GFRAA >60  --  >60   < > >60   < > >60 >60 >60  ANIONGAP 13   < > 19*   < > 11   < > 6 5 9    < > = values in this interval not displayed.     Hematology Recent Labs  Lab 06/30/20 2000 07/01/20 0500 07/01/20 1920  WBC 4.3 4.2 3.7*  RBC 1.94* 3.06* 3.12*  HGB 6.0* 9.4* 9.4*  HCT 18.8* 28.2* 28.6*  MCV 96.9 92.2 91.7  MCH 30.9 30.7 30.1  MCHC 31.9 33.3 32.9  RDW 18.6* 17.6* 18.2*  PLT 138* 129* 150    Cardiac EnzymesNo results for input(s): TROPONINI in the last 168 hours. No results for input(s): TROPIPOC in the last 168 hours.   BNPNo results for input(s): BNP, PROBNP in the last 168 hours.   DDimer No results for input(s): DDIMER in the last 168 hours.   Radiology    DG CHEST PORT 1 VIEW  Result Date: 07/01/2020 CLINICAL DATA:  78 year old male with acute respiratory failure  EXAM: PORTABLE CHEST 1 VIEW COMPARISON:  06/30/2020 FINDINGS: Cardiomediastinal silhouette unchanged in size and contour. Similar appearance of architectural distortion of the right greater than left lungs. No pneumothorax. No large pleural effusion. Coarsened interstitial markings persist with no new confluent airspace disease. Interval slight withdrawal of the left IJ central venous catheter with the tip appearing to terminate superior vena cava. IMPRESSION: Similar appearance of the lungs, with chronic lung changes, scarring/atelectasis and no new confluent airspace disease. Interval slight withdrawal of left IJ central venous catheter. Electronically Signed   By: Corrie Mckusick D.O.   On: 07/01/2020 08:54   DG Chest Port 1 View  Result Date: 06/30/2020 CLINICAL DATA:  Central line placement. EXAM: PORTABLE CHEST 1 VIEW COMPARISON:  June 10, 2020. FINDINGS: The heart size and mediastinal contours are within normal limits. No pneumothorax is noted. Left lung is clear. Mild right basilar subsegmental atelectasis is noted. Interval placement of left internal jugular catheter with distal tip in expected position of right atrium. The visualized skeletal structures are unremarkable. IMPRESSION: Interval placement of left internal jugular catheter with distal tip in expected position of right atrium. Withdrawal by 3-4 cm is recommended. Mild right basilar subsegmental atelectasis is noted. Electronically Signed   By: Marijo Conception M.D.   On: 06/30/2020 16:10   DG Abd Portable 1V  Result Date: 07/01/2020 CLINICAL DATA:  Fecal impaction. EXAM: PORTABLE ABDOMEN - 1 VIEW COMPARISON:  February 02, 2018. FINDINGS: No abnormal bowel dilatation is noted. Mild amount of stool is noted in the rectum. No radio-opaque calculi or other significant radiographic abnormality are seen. IMPRESSION: Mild stool burden is noted. No evidence of bowel obstruction or ileus. Electronically Signed   By: Marijo Conception M.D.   On:  07/01/2020 15:34    Cardiac Studies   2D echo 06/07/2020 IMPRESSIONS    1. Left ventricular ejection fraction, by estimation, is 25 to 30%. The  left ventricle has severely decreased function. The left ventricle has no  regional wall motion abnormalities. Left ventricular diastolic parameters  are consistent with Grade II  diastolic dysfunction (pseudonormalization).  2. Right ventricular systolic function is normal. The right ventricular  size is normal. There is normal pulmonary artery systolic pressure.  3. The mitral valve is normal in structure. No evidence of mitral valve  regurgitation. No evidence of mitral stenosis.  4. The aortic valve is grossly normal. Aortic valve regurgitation is not  visualized. No aortic stenosis is present.  5. The inferior vena cava is normal in size with greater than 50%  respiratory variability, suggesting right atrial pressure of 3 mmHg.   Patient Profile     79 y.o. male with a history of CLL, DM, OSA, HTN, HLD, GERD, schatzkis's ringwho is being seen for the evaluation ofatrial fibrillationat the request ofDr. Cirigliano.  Assessment & Plan    1.  Atrial Fibrillation with RVR -this occurred in setting of DKA, Hypokalemia (K+ 2.9) and GIB with Hbg 7.4. -he has converted to NSR with IV Amio and is maintaining NSR -not on anticoagulation due to GIB -will continue on IV Amio for now until acute illness improves  2.  Acute GIB -? From sterdoral colitis with fecal impaction -Hbg dropped to 6 but now 9.4 after PRBCs -per TRH and GI  3.  DKA -per TRH  4.  HTN -BP improved after restarting Carvedilol -home Entresto and Carvedilol initially on hold due to hypotension with afib with RVR -Continue Carvedilol 12.5mg  BID and restart Entresto 24-26mg  BID  5.  Chronic combined systolic/diastolic CHF -EF 57-01% -appears well compensated on exam with no volume overload -Entresto and Carvedilol on hold initially due to soft BP which  has resolved -continue Carvedilol 12.5mg  BID -restart Entresto 24-26mg  BID      For questions or updates, please contact [facility] Please consult [email or web address] for contact info under Cardiology/STEMI.      Signed, Fransico Him, MD  07/02/2020, 8:18 AM

## 2020-07-02 NOTE — Progress Notes (Signed)
PROGRESS NOTE    Tony Marshall  EQA:[id number] DOB: 12/11/1941 DOA: 06/28/2020 PCP: Elayne Snare, MD   Brief Narrative:  Admit date:06/28/2020 Chief compliant:diarrhea 79 y.o.malewith medical history significant ofCLL, DM, HTN, HLD, combined systolic/diastolic CHF presents with complaints of diarrhea which started suddenly on 7/13. Noted to be maroon in color with blood clots. Pt's wife arranged outpt visit with primary GI provider, Dr Hilarie Fredrickson, for 7/15, however, symptoms worsened and pt subsequently was brought to ED for further work up. Patient was recently discharged on 7/6 after treatment of septic shock from LE cellulitis. Pt completed course of Keflex.Patient had normal echo in 2015 but in last admission noted to have low EF of 25% (new diagnosis) and was supposed to follow-up with cardiology on 7/16. ED Course:Afebrile,found to have glucose of 252 with an anion gap of 19. Pt was started on IVF boluses with basal IVF hydration with insulin gtt. Pt was noted to be heme pos with hgb of 8.6. GI consulted by EDP. Hospital course:Patient admitted to [facility] and GI recommended laxatives for fecal impaction possibly causing stercoral colitis.Hospital course complicated by extreme agitation/delirium where patient was combative/hitting staff members. Multiple interventions including bedside sitter and IV Benadryl ordered on 7/15 with which he showed some improvement. Patient was also seen by GI, I attempted manual disimpaction with partial relief. Recommended to continue laxatives with plan to follow-up closely as inpatient.   Assessment & Plan:   Active Problems:   Essential hypertension   Pure hypercholesterolemia   CLL (chronic lymphocytic leukemia) (HCC)   Increased anion gap metabolic acidosis   Symptomatic anemia   Hyperglycemia due to diabetes mellitus (HCC)   DKA (diabetic ketoacidoses) (HCC)   Systolic and diastolic CHF, chronic (HCC)   Fecal impaction (HCC)   Overflow  diarrhea   Rectal bleeding   Lower GI bleed   Central venous catheter in place   Shock circulatory ([place])   1.New onset A. fib with RVR: On bedside evaluation, patient asymptomatic without complaints of chest pain or dyspnea. Bedside blood pressure check revealed hypotension with systolic 97L to 89Q, limiting AV blocker administration (Cardizem/metoprolol). Received 1 dose of digoxin 0.25 mg IV. Heart rate improved from 150s to 130s. Blood pressure transiently improved but again dropped to systolic 11H. Stat cardiology and critical care consult obtained. Patient known to see [facility], Dr. Angelena Form from 2015 (evaluated for chest pain) and to Dr. Oval Linsey from last admission (evaluated for new cardiomyopathy with low EF-plan for outpatient cardiac cath). Patienttransferred to ICU and initiated on amiodarone drip which cards plans to continue for an additional day Morning labs show resolved hypokalemia,  acute on chronic anemia with hemoglobin at 9.4 s/p transfusion Appreciate cardiology assistance.  2.DKA : POA. Patient noted to be on Premeal insulin and Tresiba 7 units at home. Most recent hemoglobin A1c in June was 7.7. Started on insulin drip on admission. Bicarb normalized andAG closed. Transitioned to Lantus insulin 5 units with SSI on 7/14. Patient remained ondextrose fluidsovernightin concern for poor oral intake and low BG levels, reducedlantus to 4 units. Blood glucose today up at 277. DC dextrose fluid and transition to lactated Ringer's for hypotension.Continue to monitor closely and adjust Lantus as needed. Diabetes educator following.  2. Lower GI bleed with acute blood loss anemia: Had hematochezia in ED. Seen by GI who feel (based on recent imaging/rectal exam) he has fecal impaction with stercoral ulcer/hemorrhoids. Recommended aggressive bowel regimen.Attempted fecal disimpaction at bedside yesterday but with partial results. Appreciate GI input, enema  today, will  f/up tomorrow  3. Combined systolic/diastolic JQB:HALPFXT compensated. EF of 25-30% noted on recent echo.Noted to be on Coreg and Entresto at home.Patient was advised by cardiology to discontinue Actos, Benzapril and Norvasc in last admission. Currently bp increasing, titrate meds carefeully on amio and bb.  4.Altered mental status, delirium : Awakens but still confused,Depakote and IV Benadryl as needed use. Reluctant to utilize antipsychotics given prolonged Qtc on EKG (575ms) . Recommended to avoid benzos. D/w psychiatry who saw patient , appreciate their input  5.KWI:OXBDZHGD by oncology as outpatient. CBC trends appear stable, reviewed  6. Hypokalemia/hypomagnesemia:recheck and replete as needed  7. Recentadmission forcellulitis with sepsis:now resolved, completed antibiotics.   8.Hyperlipidemia:resumed home meds  DVT prophylaxis: SCD/Compression stockings  Code Status: full    Code Status Orders  (From admission, onward)         Start     Ordered   06/28/20 1120  Full code  Continuous        06/28/20 1122        Code Status History    Date Active Date Inactive Code Status Order ID Comments User Context   06/05/2020 1839 06/21/2020 0024 Full Code [id number]  Cherene Altes, MD ED   Advance Care Planning Activity     Family Communication: wife at bedside  Disposition Plan: Status is: Inpatient  Remains inpatient appropriate because:IV treatments appropriate due to intensity of illness or inability to take PO   Dispo: The patient is from: Home              Anticipated d/c is to: Home              Anticipated d/c date is: 2 days              Patient currently is not medically stable to d/c.       Consults called: gi, cards pccm, psych Admission status: Inpatient   Consultants:   as above  Procedures:  CT ABDOMEN PELVIS WO CONTRAST  Result Date: 06/07/2020 CLINICAL DATA:  Respiratory failure and abdominal pain with fever. History  of colonic polyps and diverticulosis. History of CLL. EXAM: CT CHEST, ABDOMEN AND PELVIS WITHOUT CONTRAST TECHNIQUE: Multidetector CT imaging of the chest, abdomen and pelvis was performed following the standard protocol without IV contrast. COMPARISON:  Chest CTA 05/16/2020.  Abdominopelvic CT 04/05/2020. FINDINGS: CT CHEST FINDINGS Cardiovascular: Right arm PICC extends to the superior cavoatrial junction. There is diffuse atherosclerosis of the aorta, great vessels and coronary arteries. The heart is mildly enlarged. No significant pericardial fluid. Mediastinum/Nodes: There are no enlarged mediastinal, hilar or axillary lymph nodes. The thyroid gland, trachea and esophagus demonstrate no significant findings. Lungs/Pleura: There are new small bilateral pleural effusions with dependent opacities in both lungs, probably atelectasis. These obscure the previously demonstrated nodular densities in both lower lobes. These were not present on the abdominal CT from April and were probably inflammatory. No consolidation or new abnormality identified. There is mild central airway thickening. Musculoskeletal/Chest wall: No chest wall mass or suspicious osseous findings. CT ABDOMEN AND PELVIS FINDINGS Hepatobiliary: No focal hepatic abnormalities are identified on noncontrast imaging. The gallbladder is mildly distended without evidence of gallstones, wall thickening or focal surrounding inflammation. Pancreas: No focal pancreatic abnormality or surrounding inflammation identified. Spleen: Resolution of previously demonstrated splenomegaly. No focal abnormality identified. Adrenals/Urinary Tract: Both adrenal glands appear normal. Stable dominant cyst in the lower interpolar region of the right kidney. No evidence of urinary tract calculus or hydronephrosis.  New symmetric perinephric soft tissue stranding bilaterally without focal fluid collection. The bladder is decompressed by a Foley catheter. There is a small amount of  air in the bladder lumen. Possible mild bladder wall thickening. Stomach/Bowel: Enteric contrast is present within the colon, but there is none in the stomach or small bowel. The stomach and small bowel are decompressed without apparent focal wall thickening. The appendix appears normal. Diverticular changes are present within the descending and sigmoid colon. No evidence of bowel obstruction or perforation. Vascular/Lymphatic: Again demonstrated are several mildly enlarged retroperitoneal and left pelvic lymph nodes, similar to the prior CT and presumably related to the patient's CLL. No progressive adenopathy identified. Diffuse aortic and branch vessel atherosclerosis without acute vascular findings on noncontrast imaging. Reproductive: Central prostatic calcifications. Other: New small amount of ascites around the liver and in both pericolic gutters and the pelvis. No focal extraluminal fluid collection or extravasated enteric contrast. There is generalized edema throughout the intra-abdominal and subcutaneous fat. There is a stable small umbilical hernia containing only fat. Musculoskeletal: No acute or significant osseous findings. IMPRESSION: 1. New small bilateral pleural effusions, ascites and generalized edema throughout the intra-abdominal and subcutaneous fat, consistent with anasarca. No focal extraluminal fluid collection or extravasated enteric contrast. 2. New symmetric perinephric soft tissue stranding bilaterally without focal fluid collection. The bladder is decompressed by a Foley catheter. Possible mild bladder wall thickening. Correlate with urinalysis. 3. Probable atelectasis at both lung bases adjacent to the pleural effusions. No evidence of pneumonia. 4. Stable mildly enlarged retroperitoneal and left pelvic lymph nodes, presumably related to the patient's chronic lymphocytic leukemia. No significant residual splenomegaly. 5. Aortic Atherosclerosis (ICD10-I70.0). Electronically Signed   By:  Richardean Sale M.D.   On: 06/07/2020 14:08   DG Lumbar Spine 2-3 Views  Result Date: 06/27/2020 CLINICAL DATA:  Low back pain.  No known injury. EXAM: LUMBAR SPINE - 2-3 VIEW COMPARISON:  CT chest, abdomen and pelvis 06/07/2020. FINDINGS: Vertebral body height and alignment are maintained. Intervertebral disc space height is normal. Paraspinous structures demonstrate atherosclerosis. IMPRESSION: Normal appearing lumbar spine. Aortic Atherosclerosis (ICD10-I70.0). Electronically Signed   By: Inge Rise M.D.   On: 06/27/2020 11:01   CT Head Wo Contrast  Result Date: 06/05/2020 CLINICAL DATA:  Altered mental status fatigue and weakness EXAM: CT HEAD WITHOUT CONTRAST TECHNIQUE: Contiguous axial images were obtained from the base of the skull through the vertex without intravenous contrast. COMPARISON:  MRI 11/07/2014 FINDINGS: Brain: No acute territorial infarction, hemorrhage, or intracranial mass. Mild to moderate atrophy. Mild hypodensity in the white matter consistent with chronic small vessel ischemic change. Prominent ventricle size, likely due to atrophy. Vascular: No hyperdense vessels.  No unexpected calcification Skull: Motion artifact.  No gross fracture Sinuses/Orbits: No acute finding. Other: None IMPRESSION: 1. No CT evidence for acute intracranial abnormality. 2. Atrophy and mild chronic small vessel ischemic changes of the white matter. Electronically Signed   By: Donavan Foil M.D.   On: 06/05/2020 17:29   CT CHEST WO CONTRAST  Result Date: 06/07/2020 CLINICAL DATA:  Respiratory failure and abdominal pain with fever. History of colonic polyps and diverticulosis. History of CLL. EXAM: CT CHEST, ABDOMEN AND PELVIS WITHOUT CONTRAST TECHNIQUE: Multidetector CT imaging of the chest, abdomen and pelvis was performed following the standard protocol without IV contrast. COMPARISON:  Chest CTA 05/16/2020.  Abdominopelvic CT 04/05/2020. FINDINGS: CT CHEST FINDINGS Cardiovascular: Right arm PICC  extends to the superior cavoatrial junction. There is diffuse atherosclerosis of the aorta, great  vessels and coronary arteries. The heart is mildly enlarged. No significant pericardial fluid. Mediastinum/Nodes: There are no enlarged mediastinal, hilar or axillary lymph nodes. The thyroid gland, trachea and esophagus demonstrate no significant findings. Lungs/Pleura: There are new small bilateral pleural effusions with dependent opacities in both lungs, probably atelectasis. These obscure the previously demonstrated nodular densities in both lower lobes. These were not present on the abdominal CT from April and were probably inflammatory. No consolidation or new abnormality identified. There is mild central airway thickening. Musculoskeletal/Chest wall: No chest wall mass or suspicious osseous findings. CT ABDOMEN AND PELVIS FINDINGS Hepatobiliary: No focal hepatic abnormalities are identified on noncontrast imaging. The gallbladder is mildly distended without evidence of gallstones, wall thickening or focal surrounding inflammation. Pancreas: No focal pancreatic abnormality or surrounding inflammation identified. Spleen: Resolution of previously demonstrated splenomegaly. No focal abnormality identified. Adrenals/Urinary Tract: Both adrenal glands appear normal. Stable dominant cyst in the lower interpolar region of the right kidney. No evidence of urinary tract calculus or hydronephrosis. New symmetric perinephric soft tissue stranding bilaterally without focal fluid collection. The bladder is decompressed by a Foley catheter. There is a small amount of air in the bladder lumen. Possible mild bladder wall thickening. Stomach/Bowel: Enteric contrast is present within the colon, but there is none in the stomach or small bowel. The stomach and small bowel are decompressed without apparent focal wall thickening. The appendix appears normal. Diverticular changes are present within the descending and sigmoid colon. No  evidence of bowel obstruction or perforation. Vascular/Lymphatic: Again demonstrated are several mildly enlarged retroperitoneal and left pelvic lymph nodes, similar to the prior CT and presumably related to the patient's CLL. No progressive adenopathy identified. Diffuse aortic and branch vessel atherosclerosis without acute vascular findings on noncontrast imaging. Reproductive: Central prostatic calcifications. Other: New small amount of ascites around the liver and in both pericolic gutters and the pelvis. No focal extraluminal fluid collection or extravasated enteric contrast. There is generalized edema throughout the intra-abdominal and subcutaneous fat. There is a stable small umbilical hernia containing only fat. Musculoskeletal: No acute or significant osseous findings. IMPRESSION: 1. New small bilateral pleural effusions, ascites and generalized edema throughout the intra-abdominal and subcutaneous fat, consistent with anasarca. No focal extraluminal fluid collection or extravasated enteric contrast. 2. New symmetric perinephric soft tissue stranding bilaterally without focal fluid collection. The bladder is decompressed by a Foley catheter. Possible mild bladder wall thickening. Correlate with urinalysis. 3. Probable atelectasis at both lung bases adjacent to the pleural effusions. No evidence of pneumonia. 4. Stable mildly enlarged retroperitoneal and left pelvic lymph nodes, presumably related to the patient's chronic lymphocytic leukemia. No significant residual splenomegaly. 5. Aortic Atherosclerosis (ICD10-I70.0). Electronically Signed   By: Richardean Sale M.D.   On: 06/07/2020 14:08   DG CHEST PORT 1 VIEW  Result Date: 07/01/2020 CLINICAL DATA:  79 year old male with acute respiratory failure EXAM: PORTABLE CHEST 1 VIEW COMPARISON:  06/30/2020 FINDINGS: Cardiomediastinal silhouette unchanged in size and contour. Similar appearance of architectural distortion of the right greater than left  lungs. No pneumothorax. No large pleural effusion. Coarsened interstitial markings persist with no new confluent airspace disease. Interval slight withdrawal of the left IJ central venous catheter with the tip appearing to terminate superior vena cava. IMPRESSION: Similar appearance of the lungs, with chronic lung changes, scarring/atelectasis and no new confluent airspace disease. Interval slight withdrawal of left IJ central venous catheter. Electronically Signed   By: Corrie Mckusick D.O.   On: 07/01/2020 08:54   DG Chest  Port 1 View  Result Date: 06/30/2020 CLINICAL DATA:  Central line placement. EXAM: PORTABLE CHEST 1 VIEW COMPARISON:  June 10, 2020. FINDINGS: The heart size and mediastinal contours are within normal limits. No pneumothorax is noted. Left lung is clear. Mild right basilar subsegmental atelectasis is noted. Interval placement of left internal jugular catheter with distal tip in expected position of right atrium. The visualized skeletal structures are unremarkable. IMPRESSION: Interval placement of left internal jugular catheter with distal tip in expected position of right atrium. Withdrawal by 3-4 cm is recommended. Mild right basilar subsegmental atelectasis is noted. Electronically Signed   By: Marijo Conception M.D.   On: 06/30/2020 16:10   DG CHEST PORT 1 VIEW  Result Date: 06/10/2020 CLINICAL DATA:  Respiratory abnormality. EXAM: PORTABLE CHEST 1 VIEW COMPARISON:  June 08, 2020 FINDINGS: A right PICC line terminates in the central SVC. Haziness in the lung bases is identified. Mild cardiomegaly. The hila and mediastinum are unremarkable. No nodules or masses. No overt edema. IMPRESSION: 1. Increasing hazy opacities in the lung bases could represent developing infiltrate or layering effusions with underlying atelectasis. Recommend clinical correlation and attention on follow-up. 2. The right PICC line remains in good position. 3. No acute interval changes otherwise seen. Electronically  Signed   By: Dorise Bullion III M.D   On: 06/10/2020 06:41   DG CHEST PORT 1 VIEW  Result Date: 06/08/2020 CLINICAL DATA:  Sepsis. EXAM: PORTABLE CHEST 1 VIEW COMPARISON:  CT report 06/07/2020.  Chest x-ray 06/07/2020. FINDINGS: Right PICC line noted with tip at cavoatrial junction. Heart size stable. Mild bibasilar atelectasis. No pleural effusion or pneumothorax. Stable small calcific density noted the right humeral head, most likely a bone island. IMPRESSION: 1.  Right PICC line noted with tip at cavoatrial junction. 2.  Low lung volumes.  Mild bibasilar atelectasis. Electronically Signed   By: Marcello Moores  Register   On: 06/08/2020 07:25   DG Chest Port 1 View  Result Date: 06/07/2020 CLINICAL DATA:  Respiratory failure EXAM: PORTABLE CHEST 1 VIEW COMPARISON:  06/06/2020 FINDINGS: Cardiac shadow is at the upper limits of normal in size and stable. Aortic calcifications are again seen. The lungs are well aerated bilaterally. No focal infiltrate or sizable effusion is seen. No bony abnormality is noted. IMPRESSION: No acute abnormality seen. Electronically Signed   By: Inez Catalina M.D.   On: 06/07/2020 09:03   DG CHEST PORT 1 VIEW  Result Date: 06/06/2020 CLINICAL DATA:  Possible pneumonia EXAM: PORTABLE CHEST 1 VIEW COMPARISON:  06/05/2020 FINDINGS: Cardiac shadow is stable. Tortuous aorta with calcifications is again seen. Lungs are well aerated bilaterally. No focal infiltrate or sizable effusion is seen. Some accentuation of the mediastinum is noted due to patient rotation. IMPRESSION: No acute abnormality noted. Electronically Signed   By: Inez Catalina M.D.   On: 06/06/2020 13:25   DG Chest Portable 1 View  Result Date: 06/05/2020 CLINICAL DATA:  Fatigue and weakness. EXAM: PORTABLE CHEST 1 VIEW COMPARISON:  May 16, 2020 FINDINGS: The study is limited secondary to patient rotation. There is no evidence of acute infiltrate, pleural effusion or pneumothorax. The heart size and mediastinal contours  are within normal limits. A chronic tenth left rib fracture is seen. The visualized skeletal structures are otherwise unremarkable. IMPRESSION: No active disease. Electronically Signed   By: Virgina Norfolk M.D.   On: 06/05/2020 16:07   DG Abd Portable 1V  Result Date: 07/01/2020 CLINICAL DATA:  Fecal impaction. EXAM: PORTABLE ABDOMEN -  1 VIEW COMPARISON:  February 02, 2018. FINDINGS: No abnormal bowel dilatation is noted. Mild amount of stool is noted in the rectum. No radio-opaque calculi or other significant radiographic abnormality are seen. IMPRESSION: Mild stool burden is noted. No evidence of bowel obstruction or ileus. Electronically Signed   By: Marijo Conception M.D.   On: 07/01/2020 15:34   ECHOCARDIOGRAM COMPLETE  Result Date: 06/07/2020    ECHOCARDIOGRAM REPORT   Patient Name:   Tony Marshall Becker Date of Exam: 06/07/2020 Medical Rec #:  [id number]         Height:       68.5 in Accession #:    [id number]        Weight:       130.1 lb Date of Birth:  April 09, 1941         BSA:          49.711 m Patient Age:    45 years          BP:           93/60 mmHg Patient Gender: M                 HR:           69 bpm. Exam Location:  Inpatient Procedure: 2D Echo, Cardiac Doppler, Color Doppler and Intracardiac            Opacification Agent Indications:    Chemo V67.2 / Z09                 Fever 780.6 / R50.9  History:        Patient has no prior history of Echocardiogram examinations.                 Risk Factors:Hypertension, Diabetes, Dyslipidemia and                 Non-Smoker. GERD.  Sonographer:    Vickie Epley RDCS Referring Phys: [id number] [place]  1. Left ventricular ejection fraction, by estimation, is 25 to 30%. The left ventricle has severely decreased function. The left ventricle has no regional wall motion abnormalities. Left ventricular diastolic parameters are consistent with Grade II diastolic dysfunction (pseudonormalization).  2. Right ventricular systolic function is normal. The  right ventricular size is normal. There is normal pulmonary artery systolic pressure.  3. The mitral valve is normal in structure. No evidence of mitral valve regurgitation. No evidence of mitral stenosis.  4. The aortic valve is grossly normal. Aortic valve regurgitation is not visualized. No aortic stenosis is present.  5. The inferior vena cava is normal in size with greater than 50% respiratory variability, suggesting right atrial pressure of 3 mmHg. FINDINGS  Left Ventricle: Left ventricular ejection fraction, by estimation, is 25 to 30%. The left ventricle has severely decreased function. The left ventricle has no regional wall motion abnormalities. Definity contrast agent was given IV to delineate the left  ventricular endocardial borders. The left ventricular internal cavity size was normal in size. There is no left ventricular hypertrophy. Left ventricular diastolic parameters are consistent with Grade II diastolic dysfunction (pseudonormalization). Right Ventricle: The right ventricular size is normal. No increase in right ventricular wall thickness. Right ventricular systolic function is normal. There is normal pulmonary artery systolic pressure. The tricuspid regurgitant velocity is 1.96 m/s, and  with an assumed right atrial pressure of 3 mmHg, the estimated right ventricular systolic pressure is 17.7 mmHg. Left Atrium: Left atrial size was normal  in size. Right Atrium: Right atrial size was normal in size. Pericardium: There is no evidence of pericardial effusion. Mitral Valve: The mitral valve is normal in structure. No evidence of mitral valve regurgitation. No evidence of mitral valve stenosis. Tricuspid Valve: The tricuspid valve is normal in structure. Tricuspid valve regurgitation is mild . No evidence of tricuspid stenosis. Aortic Valve: The aortic valve is grossly normal. Aortic valve regurgitation is not visualized. No aortic stenosis is present. Pulmonic Valve: The pulmonic valve was normal  in structure. Pulmonic valve regurgitation is not visualized. No evidence of pulmonic stenosis. Aorta: The aortic root and ascending aorta are structurally normal, with no evidence of dilitation. Venous: The inferior vena cava is normal in size with greater than 50% respiratory variability, suggesting right atrial pressure of 3 mmHg. IAS/Shunts: The atrial septum is grossly normal.  LEFT VENTRICLE PLAX 2D LVIDd:         4.40 cm      Diastology LVIDs:         3.70 cm      LV e' lateral:   7.27 cm/s LV PW:         0.70 cm      LV E/e' lateral: 10.3 LV IVS:        0.70 cm      LV e' medial:    6.57 cm/s LVOT diam:     1.80 cm      LV E/e' medial:  11.4 LV SV:         26 LV SV Index:   15 LVOT Area:     2.54 cm  LV Volumes (MOD) LV vol d, MOD A2C: 126.0 ml LV vol d, MOD A4C: 113.0 ml LV vol s, MOD A2C: 85.1 ml LV vol s, MOD A4C: 60.7 ml LV SV MOD A2C:     40.9 ml LV SV MOD A4C:     113.0 ml LV SV MOD BP:      44.6 ml RIGHT VENTRICLE RV S prime:     7.97 cm/s TAPSE (M-mode): 1.6 cm LEFT ATRIUM             Index       RIGHT ATRIUM           Index LA diam:        3.10 cm 1.81 cm/m  RA Area:     13.40 cm LA Vol (A2C):   28.0 ml 16.36 ml/m RA Volume:   31.10 ml  18.17 ml/m LA Vol (A4C):   23.0 ml 13.44 ml/m LA Biplane Vol: 27.5 ml 16.07 ml/m  AORTIC VALVE LVOT Vmax:   58.50 cm/s LVOT Vmean:  39.600 cm/s LVOT VTI:    0.102 m  AORTA Ao Root diam: 3.40 cm MITRAL VALVE               TRICUSPID VALVE MV Area (PHT): 4.06 cm    TR Peak grad:   15.4 mmHg MV Decel Time: 187 msec    TR Vmax:        196.00 cm/s MV E velocity: 75.00 cm/s MV A velocity: 72.40 cm/s  SHUNTS MV E/A ratio:  1.04        Systemic VTI:  0.10 m                            Systemic Diam: 1.80 cm Mertie Moores MD Electronically signed by Mertie Moores MD Signature Date/Time: 06/07/2020/12:52:20 PM  Final    VAS [place] LOWER EXTREMITY VENOUS (DVT) (ONLY [facility] & [facility] 7a-7p)  Result Date: 06/28/2020  Lower Venous DVTStudy Indications: Edema.  Comparison       [facility]  studies RT LEV 06-11-2020 and 06-07-2020. Negative for Study:           DVT. Performing Technologist: Darlin Coco  Examination Guidelines: A complete evaluation includes B-mode imaging, spectral Doppler, color Doppler, and power Doppler as needed of all accessible portions of each vessel. Bilateral testing is considered an integral part of a complete examination. Limited examinations for reoccurring indications may be performed as noted. The reflux portion of the exam is performed with the patient in reverse Trendelenburg.  +---------+---------------+---------+-----------+----------+--------------+ RIGHT    CompressibilityPhasicitySpontaneityPropertiesThrombus Aging +---------+---------------+---------+-----------+----------+--------------+ CFV      Full           Yes      Yes                                 +---------+---------------+---------+-----------+----------+--------------+ SFJ      Full                                                        +---------+---------------+---------+-----------+----------+--------------+ FV Prox  Full                                                        +---------+---------------+---------+-----------+----------+--------------+ FV Mid   Full                                                        +---------+---------------+---------+-----------+----------+--------------+ FV DistalFull                                                        +---------+---------------+---------+-----------+----------+--------------+ PFV      Full                                                        +---------+---------------+---------+-----------+----------+--------------+ POP      Full           Yes      Yes                                 +---------+---------------+---------+-----------+----------+--------------+ PTV      Full                                                         +---------+---------------+---------+-----------+----------+--------------+  PERO     Full                                                        +---------+---------------+---------+-----------+----------+--------------+   +----+---------------+---------+-----------+----------+--------------+ LEFTCompressibilityPhasicitySpontaneityPropertiesThrombus Aging +----+---------------+---------+-----------+----------+--------------+ CFV Full           Yes      Yes                                 +----+---------------+---------+-----------+----------+--------------+     Summary: RIGHT: - There is no evidence of deep vein thrombosis in the lower extremity.  - No cystic structure found in the popliteal fossa.  LEFT: - No evidence of common femoral vein obstruction.  *See table(s) above for measurements and observations. Electronically signed by Curt Jews MD on 06/28/2020 at 7:40:49 PM.    Final    VAS [place] LOWER EXTREMITY VENOUS (DVT)  Result Date: 06/11/2020  Lower Venous DVTStudy Indications: Swelling.  Risk Factors: None identified. Comparison Study: 06/07/2020 - Negative for DVT. Performing Technologist: Oliver Hum RVT  Examination Guidelines: A complete evaluation includes B-mode imaging, spectral Doppler, color Doppler, and power Doppler as needed of all accessible portions of each vessel. Bilateral testing is considered an integral part of a complete examination. Limited examinations for reoccurring indications may be performed as noted. The reflux portion of the exam is performed with the patient in reverse Trendelenburg.  +---------+---------------+---------+-----------+----------+--------------+ RIGHT    CompressibilityPhasicitySpontaneityPropertiesThrombus Aging +---------+---------------+---------+-----------+----------+--------------+ CFV      Full           Yes      Yes                                  +---------+---------------+---------+-----------+----------+--------------+ SFJ      Full                                                        +---------+---------------+---------+-----------+----------+--------------+ FV Prox  Full                                                        +---------+---------------+---------+-----------+----------+--------------+ FV Mid   Full                                                        +---------+---------------+---------+-----------+----------+--------------+ FV DistalFull                                                        +---------+---------------+---------+-----------+----------+--------------+ PFV      Full                                                        +---------+---------------+---------+-----------+----------+--------------+  POP      Full           Yes      Yes                                 +---------+---------------+---------+-----------+----------+--------------+ PTV      Full                                                        +---------+---------------+---------+-----------+----------+--------------+ PERO     Full                                                        +---------+---------------+---------+-----------+----------+--------------+   +----+---------------+---------+-----------+----------+--------------+ LEFTCompressibilityPhasicitySpontaneityPropertiesThrombus Aging +----+---------------+---------+-----------+----------+--------------+ CFV Full           Yes      Yes                                 +----+---------------+---------+-----------+----------+--------------+     Summary: RIGHT: - There is no evidence of deep vein thrombosis in the lower extremity.  - No cystic structure found in the popliteal fossa.  LEFT: - No evidence of common femoral vein obstruction.  *See table(s) above for measurements and observations. Electronically signed by Deitra Mayo MD on 06/11/2020 at 6:52:41 PM.    Final    VAS [place] LOWER EXTREMITY VENOUS (DVT)  Result Date: 06/07/2020  Lower Venous DVTStudy Indications: Pain.  Risk Factors: None identified. Limitations: Poor ultrasound/tissue interface. Comparison Study: No prior studies. Performing Technologist: Oliver Hum RVT  Examination Guidelines: A complete evaluation includes B-mode imaging, spectral Doppler, color Doppler, and power Doppler as needed of all accessible portions of each vessel. Bilateral testing is considered an integral part of a complete examination. Limited examinations for reoccurring indications may be performed as noted. The reflux portion of the exam is performed with the patient in reverse Trendelenburg.  +---------+---------------+---------+-----------+----------+--------------+ RIGHT    CompressibilityPhasicitySpontaneityPropertiesThrombus Aging +---------+---------------+---------+-----------+----------+--------------+ CFV      Full           Yes      Yes                                 +---------+---------------+---------+-----------+----------+--------------+ SFJ      Full                                                        +---------+---------------+---------+-----------+----------+--------------+ FV Prox  Full                                                        +---------+---------------+---------+-----------+----------+--------------+ FV Mid   Full                                                        +---------+---------------+---------+-----------+----------+--------------+  FV DistalFull                                                        +---------+---------------+---------+-----------+----------+--------------+ PFV      Full                                                        +---------+---------------+---------+-----------+----------+--------------+ POP      Full           Yes      Yes                                  +---------+---------------+---------+-----------+----------+--------------+ PTV      Full                                                        +---------+---------------+---------+-----------+----------+--------------+ PERO     Full                                                        +---------+---------------+---------+-----------+----------+--------------+   +----+---------------+---------+-----------+----------+--------------+ LEFTCompressibilityPhasicitySpontaneityPropertiesThrombus Aging +----+---------------+---------+-----------+----------+--------------+ CFV Full           Yes      Yes                                 +----+---------------+---------+-----------+----------+--------------+     Summary: RIGHT: - There is no evidence of deep vein thrombosis in the lower extremity.  - No cystic structure found in the popliteal fossa.  LEFT: - No evidence of common femoral vein obstruction.  *See table(s) above for measurements and observations. Electronically signed by Curt Jews MD on 06/07/2020 at 3:44:25 PM.    Final    [place] EKG SITE RITE  Result Date: 06/07/2020 If Site Rite image not attached, placement could not be confirmed due to current cardiac rhythm.    Antimicrobials:   none   Subjective: Mental status improving No acute events ovenright  Objective: Vitals:   07/02/20 0500 07/02/20 0600 07/02/20 0800 07/02/20 1200  BP:   (!) 156/92 132/63  Pulse: 67 68 63 64  Resp: 16 14 19 13   Temp:   98.8 F (37.1 C)   TempSrc:   Oral   SpO2: 98% 98% 100% 98%  Weight:      Height:        Intake/Output Summary (Last 24 hours) at 07/02/2020 1232 Last data filed at 07/02/2020 1200 Gross per 24 hour  Intake 2755.13 ml  Output 625 ml  Net 2130.13 ml   Filed Weights   06/28/20 0635 06/28/20 2100  Weight: 68 kg 57.3 kg    Examination:  General exam: Appears calm, confused  Respiratory system: Clear to auscultation. Respiratory effort  normal. Cardiovascular system: S1 & S2 heard, RRR. No JVD, murmurs, rubs, gallops or clicks. No pedal edema. Gastrointestinal system: Abdomen is nondistended, soft and nontender. No organomegaly or masses felt. Normal bowel sounds heard. Central nervous system: awakens. No focal neurological deficits. Extremities: wwp, trace edema. Skin: No rashes, lesions or ulcers Psychiatry: Judgement and insight impaiedl. Mood & affect flat but improving    Data Reviewed: I have personally reviewed following labs and imaging studies  CBC: Recent Labs  Lab 06/27/20 1010 06/27/20 1010 06/28/20 0722 06/28/20 1325 06/29/20 1328 06/29/20 1328 06/30/20 0457 06/30/20 1530 06/30/20 2000 07/01/20 0500 07/01/20 1920  WBC 5.8  --  5.6   < > 5.6  --   --  4.1 4.3 4.2 3.7*  NEUTROABS 3.9  --  3.8  --   --   --   --   --   --   --   --   HGB 8.8*  --  8.6*   < > 8.4*   < > 7.4* 6.2* 6.0* 9.4* 9.4*  HCT 26.2*   < > 26.4*   < > 25.4*   < > 22.2* 19.1* 18.8* 28.2* 28.6*  MCV 92.3   < > 97.4   < > 95.5  --   --  96.0 96.9 92.2 91.7  PLT 183  --  178   < > 192  --   --  157 138* 129* 150   < > = values in this interval not displayed.   Basic Metabolic Panel: Recent Labs  Lab 06/30/20 1530 06/30/20 2000 07/01/20 0500 07/01/20 1920 07/02/20 0430  NA 133* 131* 132* 133* 135  K 3.7 3.4* 3.1* 3.0* 3.6  CL 99 102 101 103 101  CO2 22 25 25 25 25   GLUCOSE 332* 209* 93 196* 212*  BUN 7* 7* 7* 9 9  CREATININE 0.53* 0.55* 0.46* 0.60* 0.49*  CALCIUM 7.5* 7.4* 7.7* 7.5* 7.7*  MG 1.4* 1.5* 1.6* 1.5* 1.7   GFR: Estimated Creatinine Clearance: 63.7 mL/min (A) (by C-G formula based on SCr of 0.49 mg/dL (L)). Liver Function Tests: Recent Labs  Lab 06/27/20 1010 06/28/20 0722 06/29/20 0247  AST 13* 13* 17  ALT 7 10 10   ALKPHOS 74 63 73  BILITOT 0.9 1.6* 1.5*  PROT 4.7* 4.3* 5.0*  ALBUMIN 2.3* 2.2* 2.6*   No results for input(s): LIPASE, AMYLASE in the last 168 hours. No results for input(s): AMMONIA  in the last 168 hours. Coagulation Profile: Recent Labs  Lab 06/28/20 0722  INR 1.1   Cardiac Enzymes: No results for input(s): CKTOTAL, CKMB, CKMBINDEX, TROPONINI in the last 168 hours. BNP (last 3 results) No results for input(s): PROBNP in the last 8760 hours. HbA1C: No results for input(s): HGBA1C in the last 72 hours. CBG: Recent Labs  Lab 06/30/20 2115 07/01/20 0026 07/01/20 0751 07/01/20 1656 07/01/20 2227  GLUCAP 206* 114* 92 208* 220*   Lipid Profile: No results for input(s): CHOL, HDL, LDLCALC, TRIG, CHOLHDL, LDLDIRECT in the last 72 hours. Thyroid Function Tests: No results for input(s): TSH, T4TOTAL, FREET4, T3FREE, THYROIDAB in the last 72 hours. Anemia Panel: No results for input(s): VITAMINB12, FOLATE, FERRITIN, TIBC, IRON, RETICCTPCT in the last 72 hours. Sepsis Labs: No results for input(s): PROCALCITON, LATICACIDVEN in the last 168 hours.  Recent Results (from the past 240 hour(s))  SARS Coronavirus 2 by RT PCR (hospital order, performed in [facility] hospital lab) Nasopharyngeal Nasopharyngeal Swab     Status: None  Collection Time: 06/28/20  1:38 PM   Specimen: Nasopharyngeal Swab  Result Value Ref Range Status   SARS Coronavirus 2 NEGATIVE NEGATIVE Final    Comment: (NOTE) SARS-CoV-2 target nucleic acids are NOT DETECTED.  The SARS-CoV-2 RNA is generally detectable in upper and lower respiratory specimens during the acute phase of infection. The lowest concentration of SARS-CoV-2 viral copies this assay can detect is 250 copies / mL. A negative result does not preclude SARS-CoV-2 infection and should not be used as the sole basis for treatment or other patient management decisions.  A negative result may occur with improper specimen collection / handling, submission of specimen other than nasopharyngeal swab, presence of viral mutation(s) within the areas targeted by this assay, and inadequate number of viral copies (<250 copies / mL). A  negative result must be combined with clinical observations, patient history, and epidemiological information.  Fact Sheet for Patients:   StrictlyIdeas.no  Fact Sheet for Healthcare Providers: BankingDealers.co.za  This test is not yet approved or  cleared by the [place] FDA and has been authorized for detection and/or diagnosis of SARS-CoV-2 by FDA under an Emergency Use Authorization (EUA).  This EUA will remain in effect (meaning this test can be used) for the duration of the COVID-19 declaration under Section 564(b)(1) of the Act, 21 U.S.C. section 360bbb-3(b)(1), unless the authorization is terminated or revoked sooner.  Performed at [facility], [place] [address] [place], [place] [address]   MRSA PCR Screening     Status: None   Collection Time: 06/28/20  8:54 PM   Specimen: Nasal Mucosa; Nasopharyngeal  Result Value Ref Range Status   MRSA by PCR NEGATIVE NEGATIVE Final    Comment:        The GeneXpert MRSA Assay (FDA approved for NASAL specimens only), is one component of a comprehensive MRSA colonization surveillance program. It is not intended to diagnose MRSA infection nor to guide or monitor treatment for MRSA infections. Performed at [facility], [place] [address], [place], [place] [address]          Radiology Studies: DG CHEST PORT 1 VIEW  Result Date: 07/01/2020 CLINICAL DATA:  79 year old male with acute respiratory failure EXAM: PORTABLE CHEST 1 VIEW COMPARISON:  06/30/2020 FINDINGS: Cardiomediastinal silhouette unchanged in size and contour. Similar appearance of architectural distortion of the right greater than left lungs. No pneumothorax. No large pleural effusion. Coarsened interstitial markings persist with no new confluent airspace disease. Interval slight withdrawal of the left IJ central venous catheter with the tip appearing to terminate superior vena cava.  IMPRESSION: Similar appearance of the lungs, with chronic lung changes, scarring/atelectasis and no new confluent airspace disease. Interval slight withdrawal of left IJ central venous catheter. Electronically Signed   By: Corrie Mckusick D.O.   On: 07/01/2020 08:54   DG Chest Port 1 View  Result Date: 06/30/2020 CLINICAL DATA:  Central line placement. EXAM: PORTABLE CHEST 1 VIEW COMPARISON:  June 10, 2020. FINDINGS: The heart size and mediastinal contours are within normal limits. No pneumothorax is noted. Left lung is clear. Mild right basilar subsegmental atelectasis is noted. Interval placement of left internal jugular catheter with distal tip in expected position of right atrium. The visualized skeletal structures are unremarkable. IMPRESSION: Interval placement of left internal jugular catheter with distal tip in expected position of right atrium. Withdrawal by 3-4 cm is recommended. Mild right basilar subsegmental atelectasis is noted. Electronically Signed   By: Bobbe Medico.D.  On: 06/30/2020 16:10   DG Abd Portable 1V  Result Date: 07/01/2020 CLINICAL DATA:  Fecal impaction. EXAM: PORTABLE ABDOMEN - 1 VIEW COMPARISON:  February 02, 2018. FINDINGS: No abnormal bowel dilatation is noted. Mild amount of stool is noted in the rectum. No radio-opaque calculi or other significant radiographic abnormality are seen. IMPRESSION: Mild stool burden is noted. No evidence of bowel obstruction or ileus. Electronically Signed   By: Marijo Conception M.D.   On: 07/01/2020 15:34        Scheduled Meds: . sodium chloride   Intravenous Once  . sodium chloride   Intravenous Once  . amiodarone  150 mg Intravenous Once  . bisacodyl  10 mg Rectal Daily  . carvedilol  12.5 mg Oral BID WC  . chlorhexidine  15 mL Mouth Rinse BID  . Chlorhexidine Gluconate Cloth  6 each Topical Daily  . divalproex  125 mg Oral Q12H  . feeding supplement ([employer] STANDARD 1.4)  325 mL Oral BID BM  . hydrocortisone cream    Topical TID  . insulin aspart  0-5 Units Subcutaneous QHS  . insulin aspart  0-9 Units Subcutaneous TID WC  . insulin glargine  6 Units Subcutaneous QHS  . mouth rinse  15 mL Mouth Rinse q12n4p  . multivitamin with minerals  1 tablet Oral Daily  . polyethylene glycol  17 g Oral BID  . rosuvastatin  5 mg Oral Daily  . sodium chloride flush  10-40 mL Intracatheter Q12H  . sorbitol, milk of mag, mineral oil, glycerin (SMOG) enema  960 mL Rectal Once   Continuous Infusions: . amiodarone 30 mg/hr (07/02/20 1223)  . lactated ringers 75 mL/hr at 07/02/20 1200     LOS: 4 days    Time spent: 35 min    Nicolette Bang, MD [facility]  If 7PM-7AM, please contact night-coverage  07/02/2020, 12:32 PM

## 2020-07-03 ENCOUNTER — Inpatient Hospital Stay (HOSPITAL_COMMUNITY): Payer: Medicare HMO

## 2020-07-03 ENCOUNTER — Ambulatory Visit: Payer: Medicare HMO | Admitting: Adult Health

## 2020-07-03 DIAGNOSIS — R9431 Abnormal electrocardiogram [ECG] [EKG]: Secondary | ICD-10-CM

## 2020-07-03 DIAGNOSIS — I4891 Unspecified atrial fibrillation: Secondary | ICD-10-CM

## 2020-07-03 LAB — TYPE AND SCREEN
ABO/RH(D): A POS
Antibody Screen: POSITIVE
DAT, IgG: POSITIVE
Unit division: 0
Unit division: 0
Unit division: 0

## 2020-07-03 LAB — GLUCOSE, CAPILLARY
Glucose-Capillary: 129 mg/dL — ABNORMAL HIGH (ref 70–99)
Glucose-Capillary: 130 mg/dL — ABNORMAL HIGH (ref 70–99)
Glucose-Capillary: 144 mg/dL — ABNORMAL HIGH (ref 70–99)

## 2020-07-03 LAB — BPAM RBC
Blood Product Expiration Date: 202108192359
Blood Product Expiration Date: 202108202359
Blood Product Expiration Date: 202108252359
ISSUE DATE / TIME: 202107170009
ISSUE DATE / TIME: 202107170305
Unit Type and Rh: 5100
Unit Type and Rh: 5100
Unit Type and Rh: 6200

## 2020-07-03 LAB — COMPREHENSIVE METABOLIC PANEL
ALT: 9 U/L (ref 0–44)
AST: 17 U/L (ref 15–41)
Albumin: 1.7 g/dL — ABNORMAL LOW (ref 3.5–5.0)
Alkaline Phosphatase: 64 U/L (ref 38–126)
Anion gap: 7 (ref 5–15)
BUN: 8 mg/dL (ref 8–23)
CO2: 29 mmol/L (ref 22–32)
Calcium: 7.2 mg/dL — ABNORMAL LOW (ref 8.9–10.3)
Chloride: 100 mmol/L (ref 98–111)
Creatinine, Ser: 0.46 mg/dL — ABNORMAL LOW (ref 0.61–1.24)
GFR calc Af Amer: >60 mL/min (ref >60)
GFR calc non Af Amer: >60 mL/min (ref >60)
Glucose, Bld: 159 mg/dL — ABNORMAL HIGH (ref 70–99)
Potassium: 3.2 mmol/L — ABNORMAL LOW (ref 3.5–5.1)
Sodium: 136 mmol/L (ref 135–145)
Total Bilirubin: 0.6 mg/dL (ref 0.3–1.2)
Total Protein: 3.5 g/dL — ABNORMAL LOW (ref 6.5–8.1)

## 2020-07-03 LAB — CBC WITH DIFFERENTIAL/PLATELET
Abs Immature Granulocytes: 0.16 10*3/uL — ABNORMAL HIGH (ref 0.00–0.07)
Basophils Absolute: 0 10*3/uL (ref 0.0–0.1)
Basophils Relative: 0 %
Eosinophils Absolute: 0 10*3/uL (ref 0.0–0.5)
Eosinophils Relative: 0 %
HCT: 28.8 % — ABNORMAL LOW (ref 39.0–52.0)
Hemoglobin: 9.3 g/dL — ABNORMAL LOW (ref 13.0–17.0)
Immature Granulocytes: 7 %
Lymphocytes Relative: 22 %
Lymphs Abs: 0.5 10*3/uL — ABNORMAL LOW (ref 0.7–4.0)
MCH: 29.9 pg (ref 26.0–34.0)
MCHC: 32.3 g/dL (ref 30.0–36.0)
MCV: 92.6 fL (ref 80.0–100.0)
Monocytes Absolute: 0.2 10*3/uL (ref 0.1–1.0)
Monocytes Relative: 7 %
Neutro Abs: 1.6 10*3/uL — ABNORMAL LOW (ref 1.7–7.7)
Neutrophils Relative %: 64 %
Platelets: UNDETERMINED 10*3/uL (ref 150–400)
RBC: 3.11 MIL/uL — ABNORMAL LOW (ref 4.22–5.81)
RDW: 18.7 % — ABNORMAL HIGH (ref 11.5–15.5)
WBC: 2.4 10*3/uL — ABNORMAL LOW (ref 4.0–10.5)
nRBC: 0 % (ref 0.0–0.2)

## 2020-07-03 LAB — T4, FREE: Free T4: 1.27 ng/dL — ABNORMAL HIGH (ref 0.61–1.12)

## 2020-07-03 LAB — TSH: TSH: 3.521 u[IU]/mL (ref 0.350–4.500)

## 2020-07-03 MED ORDER — AMIODARONE HCL 200 MG PO TABS
200.0000 mg | ORAL_TABLET | Freq: Two times a day (BID) | ORAL | Status: AC
  Administered 2020-07-03 – 2020-07-08 (×12): 200 mg via ORAL
  Filled 2020-07-03 (×12): qty 1

## 2020-07-03 MED ORDER — AMIODARONE HCL 200 MG PO TABS
200.0000 mg | ORAL_TABLET | Freq: Every day | ORAL | Status: DC
  Administered 2020-07-09 – 2020-07-11 (×3): 200 mg via ORAL
  Filled 2020-07-03 (×3): qty 1

## 2020-07-03 MED ORDER — POTASSIUM CHLORIDE CRYS ER 20 MEQ PO TBCR
40.0000 meq | EXTENDED_RELEASE_TABLET | ORAL | Status: AC
  Administered 2020-07-03 (×2): 40 meq via ORAL
  Filled 2020-07-03 (×2): qty 2

## 2020-07-03 NOTE — Progress Notes (Addendum)
Progress Note  Patient Name: Tony Marshall Date of Encounter: 07/03/2020  Primary Cardiologist: Skeet Latch, MD  Subjective   Patient alert and oriented (except to exact date but knows it's 2021), calm. No chest pain or SOB. "I feel okay, I guess." Wife at bedside. I recall them from last admission.  Inpatient Medications    Scheduled Meds: . sodium chloride   Intravenous Once  . sodium chloride   Intravenous Once  . amiodarone  150 mg Intravenous Once  . bisacodyl  10 mg Rectal Daily  . carvedilol  12.5 mg Oral BID WC  . chlorhexidine  15 mL Mouth Rinse BID  . Chlorhexidine Gluconate Cloth  6 each Topical Daily  . divalproex  125 mg Oral Q12H  . feeding supplement ([employer] STANDARD 1.4)  325 mL Oral BID BM  . hydrocortisone cream   Topical TID  . insulin aspart  0-5 Units Subcutaneous QHS  . insulin aspart  0-9 Units Subcutaneous TID WC  . insulin glargine  6 Units Subcutaneous QHS  . mouth rinse  15 mL Mouth Rinse q12n4p  . multivitamin with minerals  1 tablet Oral Daily  . polyethylene glycol  17 g Oral BID  . potassium chloride  40 mEq Oral Q2H  . rosuvastatin  5 mg Oral Daily  . sacubitril-valsartan  1 tablet Oral BID  . sodium chloride flush  10-40 mL Intracatheter Q12H  . sorbitol, milk of mag, mineral oil, glycerin (SMOG) enema  960 mL Rectal Once   Continuous Infusions: . amiodarone 30 mg/hr (07/03/20 0139)   PRN Meds: acetaminophen **OR** acetaminophen, dextrose, sodium chloride flush, traMADol   Vital Signs    Vitals:   07/03/20 0320 07/03/20 0400 07/03/20 0500 07/03/20 0600  BP:  (!) 165/67  (!) 152/81  Pulse:  61 68 62  Resp:  13 14 (!) 25  Temp: 98.2 F (36.8 C)     TempSrc: Oral     SpO2:  98% 99%   Weight:      Height:        Intake/Output Summary (Last 24 hours) at 07/03/2020 0803 Last data filed at 07/03/2020 0600 Gross per 24 hour  Intake 2254.99 ml  Output 1450 ml  Net 804.99 ml   Last 3 Weights 06/28/2020 06/28/2020  06/27/2020  Weight (lbs) 126 lb 5.2 oz 150 lb (No Data)  Weight (kg) 57.3 kg 68.04 kg (No Data)     Telemetry    NSR 60s with rare PVC, QTC 460ms - Personally Reviewed  Physical Exam   GEN: No acute distress.  HEENT: Normocephalic, atraumatic, sclera non-icteric. Neck: No JVD or bruits. Cardiac: RRR no murmurs, rubs, or gallops.  Radials/DP/PT 1+ and equal bilaterally.  Respiratory: Clear to auscultation bilaterally. Breathing is unlabored. GI: Soft, nontender, non-distended, BS +x 4. MS: no deformity. Extremities: No clubbing or cyanosis. No edema. Distal pedal pulses are 2+ and equal bilaterally. Neuro:  AAOx3 except does not know exact date. Follows commands. Psych:  Responds to questions appropriately with a normal affect.  Labs    High Sensitivity Troponin:   Recent Labs  Lab 06/05/20 1710  TROPONINIHS 11      Cardiac EnzymesNo results for input(s): TROPONINI in the last 168 hours. No results for input(s): TROPIPOC in the last 168 hours.   Chemistry Recent Labs  Lab 06/28/20 0722 06/28/20 1325 06/29/20 0247 06/30/20 0457 07/01/20 1920 07/02/20 0430 07/03/20 0500  NA 137   < > 136   < >  133* 135 136  K 3.2*   < > 3.2*   < > 3.0* 3.6 3.2*  CL 100   < > 102   < > 103 101 100  CO2 18*   < > 23   < > 25 25 29   GLUCOSE 252*   < > 74   < > 196* 212* 159*  BUN 12   < > 10   < > 9 9 8   CREATININE 0.70   < > 0.47*   < > 0.60* 0.49* 0.46*  CALCIUM 8.2*   < > 8.0*   < > 7.5* 7.7* 7.2*  PROT 4.3*  --  5.0*  --   --   --  3.5*  ALBUMIN 2.2*  --  2.6*  --   --   --  1.7*  AST 13*  --  17  --   --   --  17  ALT 10  --  10  --   --   --  9  ALKPHOS 63  --  73  --   --   --  64  BILITOT 1.6*  --  1.5*  --   --   --  0.6  GFRNONAA >60   < > >60   < > >60 >60 >60  GFRAA >60   < > >60   < > >60 >60 >60  ANIONGAP 19*   < > 11   < > 5 9 7    < > = values in this interval not displayed.     Hematology Recent Labs  Lab 07/01/20 0500 07/01/20 1920 07/03/20 0500  WBC 4.2  3.7* 2.4*  RBC 3.06* 3.12* 3.11*  HGB 9.4* 9.4* 9.3*  HCT 28.2* 28.6* 28.8*  MCV 92.2 91.7 92.6  MCH 30.7 30.1 29.9  MCHC 33.3 32.9 32.3  RDW 17.6* 18.2* 18.7*  PLT 129* 150 PLATELET CLUMPS NOTED ON SMEAR, UNABLE TO ESTIMATE    BNPNo results for input(s): BNP, PROBNP in the last 168 hours.   DDimer No results for input(s): DDIMER in the last 168 hours.   Radiology    DG Abd Portable 1V  Result Date: 07/01/2020 CLINICAL DATA:  Fecal impaction. EXAM: PORTABLE ABDOMEN - 1 VIEW COMPARISON:  February 02, 2018. FINDINGS: No abnormal bowel dilatation is noted. Mild amount of stool is noted in the rectum. No radio-opaque calculi or other significant radiographic abnormality are seen. IMPRESSION: Mild stool burden is noted. No evidence of bowel obstruction or ileus. Electronically Signed   By: Marijo Conception M.D.   On: 07/01/2020 15:34    Cardiac Studies   Echo 06/07/20 1. Left ventricular ejection fraction, by estimation, is 25 to 30%. The  left ventricle has severely decreased function. The left ventricle has no  regional wall motion abnormalities. Left ventricular diastolic parameters  are consistent with Grade II  diastolic dysfunction (pseudonormalization).  2. Right ventricular systolic function is normal. The right ventricular  size is normal. There is normal pulmonary artery systolic pressure.  3. The mitral valve is normal in structure. No evidence of mitral valve  regurgitation. No evidence of mitral stenosis.  4. The aortic valve is grossly normal. Aortic valve regurgitation is not  visualized. No aortic stenosis is present.  5. The inferior vena cava is normal in size with greater than 50%  respiratory variability, suggesting right atrial pressure of 3 mmHg.   Patient Profile     79 y.o. male with history of CLL, DM,  OSA, HTN, HLD,prior habitual alcohol use,GERD, Schatzki's ring, prolonged QTc, recently diagnosed cardiomyopathy in the context of acute illness.  Tumultuous course recently related to CLL, initiation of chemotherapy, dehydration, weight loss, constipation and subsequent diarrhea after laxatives. He was recently admitted6/21/2021with confusion, weakness, and fatigue, felt to have septic shock in setting of RLL cellulitis.He was noted to be hyponatremic, hypokalemic, hypomagnesemic, febrile, in metabolic acidosis with anion gap and lactic acidosis, with AKI, decreased TSH, toxic metabolic encephalopathy, prolonged QT, and chemo-induced neutropenia. Co-ox was relatively normal, arguing against cardiogenic etiology of shock.2D echo was obtained which showed severe LV dysfunction with EF 25-30%, grade 2 DD, normal RV, therefore cardiology consulted - plan to re-eval as outpatient for [facility] when more medically stable. He was readmitted 06/28/2020 with diarrhea and maroon colored stools with clots, found to have GIB and DKA. Felt to have possible fecal impaction with stercoral ulcer/hemorrhoids, being treated by GI. Hospital course complicated by extreme agitation/delirium where patient was combative/hitting staff members. Cardiology following for new onset atrial fibrillation with RVR.  Assessment & Plan    1. Acute GI bleed with ABL anemia in the context of above medical illness with DKA, recent treatment for CLL. Felt possibly from stercoral colitiis with fecal impaction. Hgb nadir 6 - Per IM/GI  2. Atrial fibrillation with RVR, occurring newly in the metabolic stressor context of DKA, hypokalemia (K 2.9) and GI bleed. He was placed on IV amiodarone with conversion to NSR. CHADSVASC 6 for CHF, HTN, age x2, DM, vascular disease (coronary calcification on CT).  - not currently a candidate for anticoagulation due to GIB - continue amiodarone for now - will discuss transitioning to oral load with Dr. Margaretann Loveless - anticipate continuing short term while patient recovers from acute illness - if he continues to maintain NSR, it may be reasonable to hold off  anticoagulation discussions moving forward pending outpatient reassessment in case atrial fib was isolated just to acute hospitalization issues  3. Recently abnormal thyroid function - TSH was 0.200 during last hospital stay, but with normal free T4. - will recheck with AM labs - if TSH still abnormal, may need endocrine curbside given that subclinical hypothyroidism has been implicated with risk of AF  4. Chronic combined CHF/cardiomyopathy - diagnosed last admission, etiology not yet known. R/LHC was to be pursued when medically stable. Still on deferral for now given acute issues, anticipate revisiting once stable as outpatient. Continue medical therapy - continue carvedilol (HR 60s) - Entresto just restarted last night since meds were on hold due to hypotension - would follow BPs today and consider titrating to 49/51mg  BID dose tomorrow if pressures remain elevated - will request daily weights, values quite variable in Epic (150->126) so suspect admit weight was a stated weight  5. Essential HTN - elevated, managed in context of above  6. Prolonged QTc - this was a problem last admission as well.  - repeat QTC 440ms on telemetry  - avoid QT prolonging agents - seems stable on amiodarone, but would avoid outpatient meds that have this potential (I.e. loperamide, tramadol) - see below re: lytes  7. Electrolyte abnormalities that include hypokalemia, hypomagnesemia, hypocalcemia - calcium corrects to near normal when accounting for hypoalbuminemia - IM is managing electrolytes presently, would aim for goal K 4.0 or greater and Mg 2.0 or greater - labs ordered for AM    For questions or updates, please contact [facility] Please consult [email or web address] for contact info under Cardiology/STEMI.  Signed, Charlie Pitter, PA-C 07/03/2020,  8:03 AM    Patient seen and examined with Dayna Dunn PA-C.  Agree as above, with the following exceptions and changes as noted below. Feels ok today, no  cardiovascular concerns. Gen: NAD, CV: RRR, no murmurs, Lungs: clear, Abd: soft, +bs, Extrem: Warm, well perfused, no edema, Neuro/Psych: alert and oriented x 3, normal mood and affect. All available labs, radiology testing, previous records reviewed. Will convert IV amiodarone to oral amiodarone since he is taking PO and they have similar bioavailability. Plan for 5 gram load, 200 mg po BID for 6 more days then 200 mg daily until follow up. He is back on entresto and continues carvedilol without issue. Appears euvolemic.  Elouise Munroe, MD 07/03/20 4:37 PM

## 2020-07-03 NOTE — Plan of Care (Signed)
  Problem: Cardiac: Goal: Ability to maintain an adequate cardiac output will improve Outcome: Progressing   Problem: Nutritional: Goal: Maintenance of adequate nutrition will improve Outcome: Progressing

## 2020-07-03 NOTE — TOC Progression Note (Signed)
Transition of Care [facility]) - Progression Note    Patient Details  Name: STOY FENN MRN: [id number] Date of Birth: 27-Apr-1941  [facility] CM/SW Contact  Purcell Mouton, RN Phone Number: 07/03/2020, 2:29 PM  Clinical Narrative:    Pt from home with wife. Pt was active with [facility] HHPT/OT/RN.   Expected Discharge Plan: Home/Self Care Barriers to Discharge: No Barriers Identified  Expected Discharge Plan and Services Expected Discharge Plan: Home/Self Care       Living arrangements for the past 2 months: Single Family Home Expected Discharge Date:  (unknown)                                     Social Determinants of Health (SDOH) Interventions    Readmission Risk Interventions No flowsheet data found.

## 2020-07-03 NOTE — Progress Notes (Signed)
PROGRESS NOTE    Tony Marshall  PJA:[id number] DOB: November 01, 1941 DOA: 06/28/2020 PCP: Elayne Snare, MD   Brief Narrative:  Admit date:06/28/2020 Chief compliant:diarrhea 79 y.o.malewith medical history significant ofCLL, DM, HTN, HLD, combined systolic/diastolic CHF presents with complaints of diarrhea which started suddenly on 7/13. Noted to be maroon in color with blood clots. Pt's wife arranged outpt visit with primary GI provider, Dr Hilarie Fredrickson, for 7/15, however, symptoms worsened and pt subsequently was brought to ED for further work up. Patient was recently discharged on 7/6 after treatment of septic shock from LE cellulitis. Pt completed course of Keflex.Patient had normal echo in 2015 but in last admission noted to have low EF of 25% (new diagnosis) and was supposed to follow-up with cardiology on 7/16. ED Course:Afebrile,found to have glucose of 252 with an anion gap of 19. Pt was started on IVF boluses with basal IVF hydration with insulin gtt. Pt was noted to be heme pos with hgb of 8.6. GI consulted by EDP. Hospital course:Patient admitted to [facility] and GI recommended laxatives for fecal impaction possibly causing stercoral colitis.Hospital course complicated by extreme agitation/delirium where patient was combative/hitting staff members. Multiple interventions including bedside sitter and IV Benadryl ordered on 7/15 with which he showed some improvement. Patient was also seen by GI, I attempted manual disimpaction with partial relief. Recommended to continue laxatives with plan to follow-up closely as inpatient.   Assessment & Plan: 1.New onset A. fib with RVR:  On bedside evaluation, patient asymptomatic without complaints of chest pain or dyspnea. hypotension with systolic 34L to 93X, limiting AV blocker administration (Cardizem/metoprolol).  Received 1 dose of digoxin 0.25 mg IV.  transferred to ICU and initiated on amiodarone drip Appreciate cardiology  assistance. Now stable. On oral Coreg as well as amiodarone. No anticoagulation.  2.DKA : POA. Patient noted to be on Premeal insulin and Tresiba 7 units at home. Most recent hemoglobin A1c in June was 7.7.  Started on insulin drip on admission. Bicarb normalized andAG closed. Transitioned to Lantus insulin 5 units with SSI on 7/14.  Diabetes educator following.  3. Lower GI bleed with acute blood loss anemia:  Had hematochezia in ED.  Seen by GI who feel (based on recent imaging/rectal exam) he has fecal impaction with stercoral ulcer/hemorrhoids.  Recommended aggressive bowel regimen.  4.  Chronic combined systolic/diastolic CHF: Appears compensated.  EF of 25-30% noted on recent echo. on Coreg and Entresto at home. Patient was advised by cardiology to discontinue Actos, Benzapril and Norvasc in last admission.  Currently bp increasing, titrate meds carefeully on amio and bb.  5. Acute metabolic encephalopathy. Altered mental status, delirium : Awakens but still confused,Depakote and IV Benadryl as needed use. Reluctant to utilize antipsychotics given prolonged Qtc on EKG (537ms) . Recommended to avoid benzos. D/w psychiatry who saw patient , appreciate their input  6.TKW:IOXBDZHG by oncology as outpatient. CBC trends appear stable, reviewed  7. Hypokalemia/hypomagnesemia:recheck and replete as needed  8.Hyperlipidemia:resumed home meds  DVT prophylaxis: SCD/Compression stockings  Code Status: full    Code Status Orders  (From admission, onward)         Start     Ordered   06/28/20 1120  Full code  Continuous        06/28/20 1122        Code Status History    Date Active Date Inactive Code Status Order ID Comments User Context   06/05/2020 1839 06/21/2020 0024 Full Code [id number]  Cherene Altes, MD ED  Advance Care Planning Activity     Family Communication: wife at bedside  Disposition Plan: Status is: Inpatient  Remains inpatient  appropriate because:IV treatments appropriate due to intensity of illness or inability to take PO   Dispo: The patient is from: Home              Anticipated d/c is to: Home              Anticipated d/c date is: 2 days              Patient currently is not medically stable to d/c.       Consults called: gi, cards pccm, psych Admission status: Inpatient   Consultants:   as above  Procedures:  CT ABDOMEN PELVIS WO CONTRAST  Result Date: 06/07/2020 CLINICAL DATA:  Respiratory failure and abdominal pain with fever. History of colonic polyps and diverticulosis. History of CLL. EXAM: CT CHEST, ABDOMEN AND PELVIS WITHOUT CONTRAST TECHNIQUE: Multidetector CT imaging of the chest, abdomen and pelvis was performed following the standard protocol without IV contrast. COMPARISON:  Chest CTA 05/16/2020.  Abdominopelvic CT 04/05/2020. FINDINGS: CT CHEST FINDINGS Cardiovascular: Right arm PICC extends to the superior cavoatrial junction. There is diffuse atherosclerosis of the aorta, great vessels and coronary arteries. The heart is mildly enlarged. No significant pericardial fluid. Mediastinum/Nodes: There are no enlarged mediastinal, hilar or axillary lymph nodes. The thyroid gland, trachea and esophagus demonstrate no significant findings. Lungs/Pleura: There are new small bilateral pleural effusions with dependent opacities in both lungs, probably atelectasis. These obscure the previously demonstrated nodular densities in both lower lobes. These were not present on the abdominal CT from April and were probably inflammatory. No consolidation or new abnormality identified. There is mild central airway thickening. Musculoskeletal/Chest wall: No chest wall mass or suspicious osseous findings. CT ABDOMEN AND PELVIS FINDINGS Hepatobiliary: No focal hepatic abnormalities are identified on noncontrast imaging. The gallbladder is mildly distended without evidence of gallstones, wall thickening or focal  surrounding inflammation. Pancreas: No focal pancreatic abnormality or surrounding inflammation identified. Spleen: Resolution of previously demonstrated splenomegaly. No focal abnormality identified. Adrenals/Urinary Tract: Both adrenal glands appear normal. Stable dominant cyst in the lower interpolar region of the right kidney. No evidence of urinary tract calculus or hydronephrosis. New symmetric perinephric soft tissue stranding bilaterally without focal fluid collection. The bladder is decompressed by a Foley catheter. There is a small amount of air in the bladder lumen. Possible mild bladder wall thickening. Stomach/Bowel: Enteric contrast is present within the colon, but there is none in the stomach or small bowel. The stomach and small bowel are decompressed without apparent focal wall thickening. The appendix appears normal. Diverticular changes are present within the descending and sigmoid colon. No evidence of bowel obstruction or perforation. Vascular/Lymphatic: Again demonstrated are several mildly enlarged retroperitoneal and left pelvic lymph nodes, similar to the prior CT and presumably related to the patient's CLL. No progressive adenopathy identified. Diffuse aortic and branch vessel atherosclerosis without acute vascular findings on noncontrast imaging. Reproductive: Central prostatic calcifications. Other: New small amount of ascites around the liver and in both pericolic gutters and the pelvis. No focal extraluminal fluid collection or extravasated enteric contrast. There is generalized edema throughout the intra-abdominal and subcutaneous fat. There is a stable small umbilical hernia containing only fat. Musculoskeletal: No acute or significant osseous findings. IMPRESSION: 1. New small bilateral pleural effusions, ascites and generalized edema throughout the intra-abdominal and subcutaneous fat, consistent with anasarca. No focal extraluminal fluid collection or extravasated  enteric contrast.  2. New symmetric perinephric soft tissue stranding bilaterally without focal fluid collection. The bladder is decompressed by a Foley catheter. Possible mild bladder wall thickening. Correlate with urinalysis. 3. Probable atelectasis at both lung bases adjacent to the pleural effusions. No evidence of pneumonia. 4. Stable mildly enlarged retroperitoneal and left pelvic lymph nodes, presumably related to the patient's chronic lymphocytic leukemia. No significant residual splenomegaly. 5. Aortic Atherosclerosis (ICD10-I70.0). Electronically Signed   By: Richardean Sale M.D.   On: 06/07/2020 14:08   DG Lumbar Spine 2-3 Views  Result Date: 06/27/2020 CLINICAL DATA:  Low back pain.  No known injury. EXAM: LUMBAR SPINE - 2-3 VIEW COMPARISON:  CT chest, abdomen and pelvis 06/07/2020. FINDINGS: Vertebral body height and alignment are maintained. Intervertebral disc space height is normal. Paraspinous structures demonstrate atherosclerosis. IMPRESSION: Normal appearing lumbar spine. Aortic Atherosclerosis (ICD10-I70.0). Electronically Signed   By: Inge Rise M.D.   On: 06/27/2020 11:01   CT Head Wo Contrast  Result Date: 06/05/2020 CLINICAL DATA:  Altered mental status fatigue and weakness EXAM: CT HEAD WITHOUT CONTRAST TECHNIQUE: Contiguous axial images were obtained from the base of the skull through the vertex without intravenous contrast. COMPARISON:  MRI 11/07/2014 FINDINGS: Brain: No acute territorial infarction, hemorrhage, or intracranial mass. Mild to moderate atrophy. Mild hypodensity in the white matter consistent with chronic small vessel ischemic change. Prominent ventricle size, likely due to atrophy. Vascular: No hyperdense vessels.  No unexpected calcification Skull: Motion artifact.  No gross fracture Sinuses/Orbits: No acute finding. Other: None IMPRESSION: 1. No CT evidence for acute intracranial abnormality. 2. Atrophy and mild chronic small vessel ischemic changes of the white matter.  Electronically Signed   By: Donavan Foil M.D.   On: 06/05/2020 17:29   CT CHEST WO CONTRAST  Result Date: 06/07/2020 CLINICAL DATA:  Respiratory failure and abdominal pain with fever. History of colonic polyps and diverticulosis. History of CLL. EXAM: CT CHEST, ABDOMEN AND PELVIS WITHOUT CONTRAST TECHNIQUE: Multidetector CT imaging of the chest, abdomen and pelvis was performed following the standard protocol without IV contrast. COMPARISON:  Chest CTA 05/16/2020.  Abdominopelvic CT 04/05/2020. FINDINGS: CT CHEST FINDINGS Cardiovascular: Right arm PICC extends to the superior cavoatrial junction. There is diffuse atherosclerosis of the aorta, great vessels and coronary arteries. The heart is mildly enlarged. No significant pericardial fluid. Mediastinum/Nodes: There are no enlarged mediastinal, hilar or axillary lymph nodes. The thyroid gland, trachea and esophagus demonstrate no significant findings. Lungs/Pleura: There are new small bilateral pleural effusions with dependent opacities in both lungs, probably atelectasis. These obscure the previously demonstrated nodular densities in both lower lobes. These were not present on the abdominal CT from April and were probably inflammatory. No consolidation or new abnormality identified. There is mild central airway thickening. Musculoskeletal/Chest wall: No chest wall mass or suspicious osseous findings. CT ABDOMEN AND PELVIS FINDINGS Hepatobiliary: No focal hepatic abnormalities are identified on noncontrast imaging. The gallbladder is mildly distended without evidence of gallstones, wall thickening or focal surrounding inflammation. Pancreas: No focal pancreatic abnormality or surrounding inflammation identified. Spleen: Resolution of previously demonstrated splenomegaly. No focal abnormality identified. Adrenals/Urinary Tract: Both adrenal glands appear normal. Stable dominant cyst in the lower interpolar region of the right kidney. No evidence of urinary tract  calculus or hydronephrosis. New symmetric perinephric soft tissue stranding bilaterally without focal fluid collection. The bladder is decompressed by a Foley catheter. There is a small amount of air in the bladder lumen. Possible mild bladder wall thickening. Stomach/Bowel: Enteric contrast is  present within the colon, but there is none in the stomach or small bowel. The stomach and small bowel are decompressed without apparent focal wall thickening. The appendix appears normal. Diverticular changes are present within the descending and sigmoid colon. No evidence of bowel obstruction or perforation. Vascular/Lymphatic: Again demonstrated are several mildly enlarged retroperitoneal and left pelvic lymph nodes, similar to the prior CT and presumably related to the patient's CLL. No progressive adenopathy identified. Diffuse aortic and branch vessel atherosclerosis without acute vascular findings on noncontrast imaging. Reproductive: Central prostatic calcifications. Other: New small amount of ascites around the liver and in both pericolic gutters and the pelvis. No focal extraluminal fluid collection or extravasated enteric contrast. There is generalized edema throughout the intra-abdominal and subcutaneous fat. There is a stable small umbilical hernia containing only fat. Musculoskeletal: No acute or significant osseous findings. IMPRESSION: 1. New small bilateral pleural effusions, ascites and generalized edema throughout the intra-abdominal and subcutaneous fat, consistent with anasarca. No focal extraluminal fluid collection or extravasated enteric contrast. 2. New symmetric perinephric soft tissue stranding bilaterally without focal fluid collection. The bladder is decompressed by a Foley catheter. Possible mild bladder wall thickening. Correlate with urinalysis. 3. Probable atelectasis at both lung bases adjacent to the pleural effusions. No evidence of pneumonia. 4. Stable mildly enlarged retroperitoneal and  left pelvic lymph nodes, presumably related to the patient's chronic lymphocytic leukemia. No significant residual splenomegaly. 5. Aortic Atherosclerosis (ICD10-I70.0). Electronically Signed   By: Richardean Sale M.D.   On: 06/07/2020 14:08   DG CHEST PORT 1 VIEW  Result Date: 07/01/2020 CLINICAL DATA:  79 year old male with acute respiratory failure EXAM: PORTABLE CHEST 1 VIEW COMPARISON:  06/30/2020 FINDINGS: Cardiomediastinal silhouette unchanged in size and contour. Similar appearance of architectural distortion of the right greater than left lungs. No pneumothorax. No large pleural effusion. Coarsened interstitial markings persist with no new confluent airspace disease. Interval slight withdrawal of the left IJ central venous catheter with the tip appearing to terminate superior vena cava. IMPRESSION: Similar appearance of the lungs, with chronic lung changes, scarring/atelectasis and no new confluent airspace disease. Interval slight withdrawal of left IJ central venous catheter. Electronically Signed   By: Corrie Mckusick D.O.   On: 07/01/2020 08:54   DG Chest Port 1 View  Result Date: 06/30/2020 CLINICAL DATA:  Central line placement. EXAM: PORTABLE CHEST 1 VIEW COMPARISON:  June 10, 2020. FINDINGS: The heart size and mediastinal contours are within normal limits. No pneumothorax is noted. Left lung is clear. Mild right basilar subsegmental atelectasis is noted. Interval placement of left internal jugular catheter with distal tip in expected position of right atrium. The visualized skeletal structures are unremarkable. IMPRESSION: Interval placement of left internal jugular catheter with distal tip in expected position of right atrium. Withdrawal by 3-4 cm is recommended. Mild right basilar subsegmental atelectasis is noted. Electronically Signed   By: Marijo Conception M.D.   On: 06/30/2020 16:10   DG CHEST PORT 1 VIEW  Result Date: 06/10/2020 CLINICAL DATA:  Respiratory abnormality. EXAM: PORTABLE  CHEST 1 VIEW COMPARISON:  June 08, 2020 FINDINGS: A right PICC line terminates in the central SVC. Haziness in the lung bases is identified. Mild cardiomegaly. The hila and mediastinum are unremarkable. No nodules or masses. No overt edema. IMPRESSION: 1. Increasing hazy opacities in the lung bases could represent developing infiltrate or layering effusions with underlying atelectasis. Recommend clinical correlation and attention on follow-up. 2. The right PICC line remains in good position. 3. No acute  interval changes otherwise seen. Electronically Signed   By: Dorise Bullion III M.D   On: 06/10/2020 06:41   DG CHEST PORT 1 VIEW  Result Date: 06/08/2020 CLINICAL DATA:  Sepsis. EXAM: PORTABLE CHEST 1 VIEW COMPARISON:  CT report 06/07/2020.  Chest x-ray 06/07/2020. FINDINGS: Right PICC line noted with tip at cavoatrial junction. Heart size stable. Mild bibasilar atelectasis. No pleural effusion or pneumothorax. Stable small calcific density noted the right humeral head, most likely a bone island. IMPRESSION: 1.  Right PICC line noted with tip at cavoatrial junction. 2.  Low lung volumes.  Mild bibasilar atelectasis. Electronically Signed   By: Marcello Moores  Register   On: 06/08/2020 07:25   DG Chest Port 1 View  Result Date: 06/07/2020 CLINICAL DATA:  Respiratory failure EXAM: PORTABLE CHEST 1 VIEW COMPARISON:  06/06/2020 FINDINGS: Cardiac shadow is at the upper limits of normal in size and stable. Aortic calcifications are again seen. The lungs are well aerated bilaterally. No focal infiltrate or sizable effusion is seen. No bony abnormality is noted. IMPRESSION: No acute abnormality seen. Electronically Signed   By: Inez Catalina M.D.   On: 06/07/2020 09:03   DG CHEST PORT 1 VIEW  Result Date: 06/06/2020 CLINICAL DATA:  Possible pneumonia EXAM: PORTABLE CHEST 1 VIEW COMPARISON:  06/05/2020 FINDINGS: Cardiac shadow is stable. Tortuous aorta with calcifications is again seen. Lungs are well aerated  bilaterally. No focal infiltrate or sizable effusion is seen. Some accentuation of the mediastinum is noted due to patient rotation. IMPRESSION: No acute abnormality noted. Electronically Signed   By: Inez Catalina M.D.   On: 06/06/2020 13:25   DG Chest Portable 1 View  Result Date: 06/05/2020 CLINICAL DATA:  Fatigue and weakness. EXAM: PORTABLE CHEST 1 VIEW COMPARISON:  May 16, 2020 FINDINGS: The study is limited secondary to patient rotation. There is no evidence of acute infiltrate, pleural effusion or pneumothorax. The heart size and mediastinal contours are within normal limits. A chronic tenth left rib fracture is seen. The visualized skeletal structures are otherwise unremarkable. IMPRESSION: No active disease. Electronically Signed   By: Virgina Norfolk M.D.   On: 06/05/2020 16:07   DG Abd Portable 1V  Result Date: 07/03/2020 CLINICAL DATA:  79 y.o. male with constipation. Medical history significant of CLL, DM, HTN, HLD, combined systolic/diastolic CHF EXAM: PORTABLE ABDOMEN - 1 VIEW COMPARISON:  Abdominal radiograph 07/01/2020 FINDINGS: The far left hemiabdomen is excluded from field of view. The bowel gas pattern is nonobstructive. Moderate stool burden. No supine evidence for free air. No unexpected radiopaque calcification. No acute finding in the visualized skeleton. IMPRESSION: Nonobstructive bowel gas pattern. Moderate stool burden. Electronically Signed   By: Audie Pinto M.D.   On: 07/03/2020 08:34   DG Abd Portable 1V  Result Date: 07/01/2020 CLINICAL DATA:  Fecal impaction. EXAM: PORTABLE ABDOMEN - 1 VIEW COMPARISON:  February 02, 2018. FINDINGS: No abnormal bowel dilatation is noted. Mild amount of stool is noted in the rectum. No radio-opaque calculi or other significant radiographic abnormality are seen. IMPRESSION: Mild stool burden is noted. No evidence of bowel obstruction or ileus. Electronically Signed   By: Marijo Conception M.D.   On: 07/01/2020 15:34   ECHOCARDIOGRAM  COMPLETE  Result Date: 06/07/2020    ECHOCARDIOGRAM REPORT   Patient Name:   SHAMERE CAMPAS [place] Date of Exam: 06/07/2020 Medical Rec #:  [id number]         Height:       68.5 in Accession #:  [id number]        Weight:       130.1 lb Date of Birth:  June 03, 1941         BSA:          55.711 m Patient Age:    64 years          BP:           93/60 mmHg Patient Gender: M                 HR:           69 bpm. Exam Location:  Inpatient Procedure: 2D Echo, Cardiac Doppler, Color Doppler and Intracardiac            Opacification Agent Indications:    Chemo V67.2 / Z09                 Fever 780.6 / R50.9  History:        Patient has no prior history of Echocardiogram examinations.                 Risk Factors:Hypertension, Diabetes, Dyslipidemia and                 Non-Smoker. GERD.  Sonographer:    Vickie Epley RDCS Referring Phys: [id number] Paia  1. Left ventricular ejection fraction, by estimation, is 25 to 30%. The left ventricle has severely decreased function. The left ventricle has no regional wall motion abnormalities. Left ventricular diastolic parameters are consistent with Grade II diastolic dysfunction (pseudonormalization).  2. Right ventricular systolic function is normal. The right ventricular size is normal. There is normal pulmonary artery systolic pressure.  3. The mitral valve is normal in structure. No evidence of mitral valve regurgitation. No evidence of mitral stenosis.  4. The aortic valve is grossly normal. Aortic valve regurgitation is not visualized. No aortic stenosis is present.  5. The inferior vena cava is normal in size with greater than 50% respiratory variability, suggesting right atrial pressure of 3 mmHg. FINDINGS  Left Ventricle: Left ventricular ejection fraction, by estimation, is 25 to 30%. The left ventricle has severely decreased function. The left ventricle has no regional wall motion abnormalities. Definity contrast agent was given IV to delineate the left   ventricular endocardial borders. The left ventricular internal cavity size was normal in size. There is no left ventricular hypertrophy. Left ventricular diastolic parameters are consistent with Grade II diastolic dysfunction (pseudonormalization). Right Ventricle: The right ventricular size is normal. No increase in right ventricular wall thickness. Right ventricular systolic function is normal. There is normal pulmonary artery systolic pressure. The tricuspid regurgitant velocity is 1.96 m/s, and  with an assumed right atrial pressure of 3 mmHg, the estimated right ventricular systolic pressure is 66.4 mmHg. Left Atrium: Left atrial size was normal in size. Right Atrium: Right atrial size was normal in size. Pericardium: There is no evidence of pericardial effusion. Mitral Valve: The mitral valve is normal in structure. No evidence of mitral valve regurgitation. No evidence of mitral valve stenosis. Tricuspid Valve: The tricuspid valve is normal in structure. Tricuspid valve regurgitation is mild . No evidence of tricuspid stenosis. Aortic Valve: The aortic valve is grossly normal. Aortic valve regurgitation is not visualized. No aortic stenosis is present. Pulmonic Valve: The pulmonic valve was normal in structure. Pulmonic valve regurgitation is not visualized. No evidence of pulmonic stenosis. Aorta: The aortic root and ascending aorta are structurally normal, with no evidence of dilitation.  Venous: The inferior vena cava is normal in size with greater than 50% respiratory variability, suggesting right atrial pressure of 3 mmHg. IAS/Shunts: The atrial septum is grossly normal.  LEFT VENTRICLE PLAX 2D LVIDd:         4.40 cm      Diastology LVIDs:         3.70 cm      LV e' lateral:   7.27 cm/s LV PW:         0.70 cm      LV E/e' lateral: 10.3 LV IVS:        0.70 cm      LV e' medial:    6.57 cm/s LVOT diam:     1.80 cm      LV E/e' medial:  11.4 LV SV:         26 LV SV Index:   15 LVOT Area:     2.54 cm  LV  Volumes (MOD) LV vol d, MOD A2C: 126.0 ml LV vol d, MOD A4C: 113.0 ml LV vol s, MOD A2C: 85.1 ml LV vol s, MOD A4C: 60.7 ml LV SV MOD A2C:     40.9 ml LV SV MOD A4C:     113.0 ml LV SV MOD BP:      44.6 ml RIGHT VENTRICLE RV S prime:     7.97 cm/s TAPSE (M-mode): 1.6 cm LEFT ATRIUM             Index       RIGHT ATRIUM           Index LA diam:        3.10 cm 1.81 cm/m  RA Area:     13.40 cm LA Vol (A2C):   28.0 ml 16.36 ml/m RA Volume:   31.10 ml  18.17 ml/m LA Vol (A4C):   23.0 ml 13.44 ml/m LA Biplane Vol: 27.5 ml 16.07 ml/m  AORTIC VALVE LVOT Vmax:   58.50 cm/s LVOT Vmean:  39.600 cm/s LVOT VTI:    0.102 m  AORTA Ao Root diam: 3.40 cm MITRAL VALVE               TRICUSPID VALVE MV Area (PHT): 4.06 cm    TR Peak grad:   15.4 mmHg MV Decel Time: 187 msec    TR Vmax:        196.00 cm/s MV E velocity: 75.00 cm/s MV A velocity: 72.40 cm/s  SHUNTS MV E/A ratio:  1.04        Systemic VTI:  0.10 m                            Systemic Diam: 1.80 cm Mertie Moores MD Electronically signed by Mertie Moores MD Signature Date/Time: 06/07/2020/12:52:20 PM    Final    VAS [place] LOWER EXTREMITY VENOUS (DVT) (ONLY [facility] & [facility] 7a-7p)  Result Date: 06/28/2020  Lower Venous DVTStudy Indications: Edema.  Comparison       Piror studies RT LEV 06-11-2020 and 06-07-2020. Negative for Study:           DVT. Performing Technologist: Darlin Coco  Examination Guidelines: A complete evaluation includes B-mode imaging, spectral Doppler, color Doppler, and power Doppler as needed of all accessible portions of each vessel. Bilateral testing is considered an integral part of a complete examination. Limited examinations for reoccurring indications may be performed as noted. The reflux portion of the exam is performed with  the patient in reverse Trendelenburg.  +---------+---------------+---------+-----------+----------+--------------+  RIGHT     Compressibility Phasicity Spontaneity Properties Thrombus Aging   +---------+---------------+---------+-----------+----------+--------------+  CFV       Full            Yes       Yes                                    +---------+---------------+---------+-----------+----------+--------------+  SFJ       Full                                                             +---------+---------------+---------+-----------+----------+--------------+  FV Prox   Full                                                             +---------+---------------+---------+-----------+----------+--------------+  FV Mid    Full                                                             +---------+---------------+---------+-----------+----------+--------------+  FV Distal Full                                                             +---------+---------------+---------+-----------+----------+--------------+  PFV       Full                                                             +---------+---------------+---------+-----------+----------+--------------+  POP       Full            Yes       Yes                                    +---------+---------------+---------+-----------+----------+--------------+  PTV       Full                                                             +---------+---------------+---------+-----------+----------+--------------+  PERO      Full                                                             +---------+---------------+---------+-----------+----------+--------------+   +----+---------------+---------+-----------+----------+--------------+  LEFT Compressibility Phasicity Spontaneity Properties Thrombus Aging  +----+---------------+---------+-----------+----------+--------------+  CFV  Full            Yes       Yes                                    +----+---------------+---------+-----------+----------+--------------+     Summary: RIGHT: - There is no evidence of deep vein thrombosis in the lower extremity.  - No cystic structure found in the popliteal fossa.   LEFT: - No evidence of common femoral vein obstruction.  *See table(s) above for measurements and observations. Electronically signed by Curt Jews MD on 06/28/2020 at 7:40:49 PM.    Final    VAS [place] LOWER EXTREMITY VENOUS (DVT)  Result Date: 06/11/2020  Lower Venous DVTStudy Indications: Swelling.  Risk Factors: None identified. Comparison Study: 06/07/2020 - Negative for DVT. Performing Technologist: Oliver Hum RVT  Examination Guidelines: A complete evaluation includes B-mode imaging, spectral Doppler, color Doppler, and power Doppler as needed of all accessible portions of each vessel. Bilateral testing is considered an integral part of a complete examination. Limited examinations for reoccurring indications may be performed as noted. The reflux portion of the exam is performed with the patient in reverse Trendelenburg.  +---------+---------------+---------+-----------+----------+--------------+  RIGHT     Compressibility Phasicity Spontaneity Properties Thrombus Aging  +---------+---------------+---------+-----------+----------+--------------+  CFV       Full            Yes       Yes                                    +---------+---------------+---------+-----------+----------+--------------+  SFJ       Full                                                             +---------+---------------+---------+-----------+----------+--------------+  FV Prox   Full                                                             +---------+---------------+---------+-----------+----------+--------------+  FV Mid    Full                                                             +---------+---------------+---------+-----------+----------+--------------+  FV Distal Full                                                             +---------+---------------+---------+-----------+----------+--------------+  PFV       Full                                                              +---------+---------------+---------+-----------+----------+--------------+  POP       Full            Yes       Yes                                    +---------+---------------+---------+-----------+----------+--------------+  PTV       Full                                                             +---------+---------------+---------+-----------+----------+--------------+  PERO      Full                                                             +---------+---------------+---------+-----------+----------+--------------+   +----+---------------+---------+-----------+----------+--------------+  LEFT Compressibility Phasicity Spontaneity Properties Thrombus Aging  +----+---------------+---------+-----------+----------+--------------+  CFV  Full            Yes       Yes                                    +----+---------------+---------+-----------+----------+--------------+     Summary: RIGHT: - There is no evidence of deep vein thrombosis in the lower extremity.  - No cystic structure found in the popliteal fossa.  LEFT: - No evidence of common femoral vein obstruction.  *See table(s) above for measurements and observations. Electronically signed by Deitra Mayo MD on 06/11/2020 at 6:52:41 PM.    Final    VAS [place] LOWER EXTREMITY VENOUS (DVT)  Result Date: 06/07/2020  Lower Venous DVTStudy Indications: Pain.  Risk Factors: None identified. Limitations: Poor ultrasound/tissue interface. Comparison Study: No prior studies. Performing Technologist: Oliver Hum RVT  Examination Guidelines: A complete evaluation includes B-mode imaging, spectral Doppler, color Doppler, and power Doppler as needed of all accessible portions of each vessel. Bilateral testing is considered an integral part of a complete examination. Limited examinations for reoccurring indications may be performed as noted. The reflux portion of the exam is performed with the patient in reverse Trendelenburg.   +---------+---------------+---------+-----------+----------+--------------+  RIGHT     Compressibility Phasicity Spontaneity Properties Thrombus Aging  +---------+---------------+---------+-----------+----------+--------------+  CFV       Full            Yes       Yes                                    +---------+---------------+---------+-----------+----------+--------------+  SFJ       Full                                                             +---------+---------------+---------+-----------+----------+--------------+  FV Prox   Full                                                             +---------+---------------+---------+-----------+----------+--------------+  FV Mid    Full                                                             +---------+---------------+---------+-----------+----------+--------------+  FV Distal Full                                                             +---------+---------------+---------+-----------+----------+--------------+  PFV       Full                                                             +---------+---------------+---------+-----------+----------+--------------+  POP       Full            Yes       Yes                                    +---------+---------------+---------+-----------+----------+--------------+  PTV       Full                                                             +---------+---------------+---------+-----------+----------+--------------+  PERO      Full                                                             +---------+---------------+---------+-----------+----------+--------------+   +----+---------------+---------+-----------+----------+--------------+  LEFT Compressibility Phasicity Spontaneity Properties Thrombus Aging  +----+---------------+---------+-----------+----------+--------------+  CFV  Full            Yes       Yes                                     +----+---------------+---------+-----------+----------+--------------+     Summary: RIGHT: - There is no evidence of deep vein thrombosis in the lower extremity.  - No cystic structure found in the popliteal fossa.  LEFT: - No evidence of common femoral vein obstruction.  *See table(s) above for measurements and observations. Electronically signed by Curt Jews MD on 06/07/2020 at 3:44:25 PM.    Final    [place] EKG SITE RITE  Result Date: 06/07/2020 If Site Rite image not attached, placement could not be confirmed due to current cardiac rhythm.   Antimicrobials:   none   Subjective: No nausea no vomiting.  No fever no chills.  No chest pain.  Slept okay last night.  Rate remains controlled.  No  BM with blood.  Multiple loose BM yesterday.  Objective: Vitals:   07/03/20 0600 07/03/20 0800 07/03/20 1000 07/03/20 1122  BP: (!) 152/81 (!) 177/67 (!) 145/80 (!) 161/71  Pulse: 62 60 (!) 56 (!) 57  Resp: (!) 25 14 13 18   Temp:  98.3 F (36.8 C)  98.1 F (36.7 C)  TempSrc:  Oral  Oral  SpO2:  99% 100% 100%  Weight:      Height:        Intake/Output Summary (Last 24 hours) at 07/03/2020 1921 Last data filed at 07/03/2020 1843 Gross per 24 hour  Intake 1349.54 ml  Output 1325 ml  Net 24.54 ml   Filed Weights   06/28/20 0635 06/28/20 2100  Weight: 68 kg 57.3 kg    Examination:  General exam: Appears calm, confused  Respiratory system: Clear to auscultation. Respiratory effort normal. Cardiovascular system: S1 & S2 heard, RRR. No JVD, murmurs, rubs, gallops or clicks. No pedal edema. Gastrointestinal system: Abdomen is nondistended, soft and nontender. No organomegaly or masses felt. Normal bowel sounds heard. Central nervous system: awakens. No focal neurological deficits. Extremities: wwp, trace edema. Skin: No rashes, lesions or ulcers Psychiatry: Judgement and insight impaiedl. Mood & affect flat but improving    Data Reviewed: I have personally reviewed following labs and  imaging studies  CBC: Recent Labs  Lab 06/27/20 1010 06/27/20 1010 06/28/20 0722 06/28/20 1325 06/30/20 1530 06/30/20 2000 07/01/20 0500 07/01/20 1920 07/03/20 0500  WBC 5.8  --  5.6   < > 4.1 4.3 4.2 3.7* 2.4*  NEUTROABS 3.9  --  3.8  --   --   --   --   --  1.6*  HGB 8.8*  --  8.6*   < > 6.2* 6.0* 9.4* 9.4* 9.3*  HCT 26.2*   < > 26.4*   < > 19.1* 18.8* 28.2* 28.6* 28.8*  MCV 92.3   < > 97.4   < > 96.0 96.9 92.2 91.7 92.6  PLT 183  --  178   < > 157 138* 129* 150 PLATELET CLUMPS NOTED ON SMEAR, UNABLE TO ESTIMATE   < > = values in this interval not displayed.   Basic Metabolic Panel: Recent Labs  Lab 06/30/20 1530 06/30/20 1530 06/30/20 2000 07/01/20 0500 07/01/20 1920 07/02/20 0430 07/03/20 0500  NA 133*   < > 131* 132* 133* 135 136  K 3.7   < > 3.4* 3.1* 3.0* 3.6 3.2*  CL 99   < > 102 101 103 101 100  CO2 22   < > 25 25 25 25 29   GLUCOSE 332*   < > 209* 93 196* 212* 159*  BUN 7*   < > 7* 7* 9 9 8   CREATININE 0.53*   < > 0.55* 0.46* 0.60* 0.49* 0.46*  CALCIUM 7.5*   < > 7.4* 7.7* 7.5* 7.7* 7.2*  MG 1.4*  --  1.5* 1.6* 1.5* 1.7  --    < > = values in this interval not displayed.   GFR: Estimated Creatinine Clearance: 63.7 mL/min (A) (by C-G formula based on SCr of 0.46 mg/dL (L)). Liver Function Tests: Recent Labs  Lab 06/27/20 1010 06/28/20 0722 06/29/20 0247 07/03/20 0500  AST 13* 13* 17 17  ALT 7 10 10 9   ALKPHOS 74 63 73 64  BILITOT 0.9 1.6* 1.5* 0.6  PROT 4.7* 4.3* 5.0* 3.5*  ALBUMIN 2.3* 2.2* 2.6* 1.7*   No results for input(s): LIPASE, AMYLASE in the last 168  hours. No results for input(s): AMMONIA in the last 168 hours. Coagulation Profile: Recent Labs  Lab 06/28/20 0722  INR 1.1   Cardiac Enzymes: No results for input(s): CKTOTAL, CKMB, CKMBINDEX, TROPONINI in the last 168 hours. BNP (last 3 results) No results for input(s): PROBNP in the last 8760 hours. HbA1C: No results for input(s): HGBA1C in the last 72 hours. CBG: Recent Labs    Lab 07/01/20 2227 07/02/20 1532 07/02/20 2055 07/03/20 1131 07/03/20 1659  GLUCAP 220* 158* 97 129* 130*   Lipid Profile: No results for input(s): CHOL, HDL, LDLCALC, TRIG, CHOLHDL, LDLDIRECT in the last 72 hours. Thyroid Function Tests: Recent Labs    07/03/20 1100  TSH 3.521  FREET4 1.27*   Anemia Panel: No results for input(s): VITAMINB12, FOLATE, FERRITIN, TIBC, IRON, RETICCTPCT in the last 72 hours. Sepsis Labs: No results for input(s): PROCALCITON, LATICACIDVEN in the last 168 hours.  Recent Results (from the past 240 hour(s))  SARS Coronavirus 2 by RT PCR (hospital order, performed in [facility] hospital lab) Nasopharyngeal Nasopharyngeal Swab     Status: None   Collection Time: 06/28/20  1:38 PM   Specimen: Nasopharyngeal Swab  Result Value Ref Range Status   SARS Coronavirus 2 NEGATIVE NEGATIVE Final    Comment: (NOTE) SARS-CoV-2 target nucleic acids are NOT DETECTED.  The SARS-CoV-2 RNA is generally detectable in upper and lower respiratory specimens during the acute phase of infection. The lowest concentration of SARS-CoV-2 viral copies this assay can detect is 250 copies / mL. A negative result does not preclude SARS-CoV-2 infection and should not be used as the sole basis for treatment or other patient management decisions.  A negative result may occur with improper specimen collection / handling, submission of specimen other than nasopharyngeal swab, presence of viral mutation(s) within the areas targeted by this assay, and inadequate number of viral copies (<250 copies / mL). A negative result must be combined with clinical observations, patient history, and epidemiological information.  Fact Sheet for Patients:   StrictlyIdeas.no  Fact Sheet for Healthcare Providers: BankingDealers.co.za  This test is not yet approved or  cleared by the [place] FDA and has been authorized for detection and/or  diagnosis of SARS-CoV-2 by FDA under an Emergency Use Authorization (EUA).  This EUA will remain in effect (meaning this test can be used) for the duration of the COVID-19 declaration under Section 564(b)(1) of the Act, 21 U.S.C. section 360bbb-3(b)(1), unless the authorization is terminated or revoked sooner.  Performed at [facility], [place] [address], [place], [place] [address]   MRSA PCR Screening     Status: None   Collection Time: 06/28/20  8:54 PM   Specimen: Nasal Mucosa; Nasopharyngeal  Result Value Ref Range Status   MRSA by PCR NEGATIVE NEGATIVE Final    Comment:        The GeneXpert MRSA Assay (FDA approved for NASAL specimens only), is one component of a comprehensive MRSA colonization surveillance program. It is not intended to diagnose MRSA infection nor to guide or monitor treatment for MRSA infections. Performed at [facility], [place] [address], [place],  [address]          Radiology Studies: DG Abd Portable 1V  Result Date: 07/03/2020 CLINICAL DATA:  79 y.o. male with constipation. Medical history significant of CLL, DM, HTN, HLD, combined systolic/diastolic CHF EXAM: PORTABLE ABDOMEN - 1 VIEW COMPARISON:  Abdominal radiograph 07/01/2020 FINDINGS: The far left hemiabdomen is excluded from field of view.  The bowel gas pattern is nonobstructive. Moderate stool burden. No supine evidence for free air. No unexpected radiopaque calcification. No acute finding in the visualized skeleton. IMPRESSION: Nonobstructive bowel gas pattern. Moderate stool burden. Electronically Signed   By: Audie Pinto M.D.   On: 07/03/2020 08:34        Scheduled Meds:  sodium chloride   Intravenous Once   sodium chloride   Intravenous Once   amiodarone  200 mg Oral BID   Followed by   Derrill Memo ON 07/09/2020] amiodarone  200 mg Oral Daily   bisacodyl  10 mg Rectal Daily   carvedilol  12.5 mg Oral BID WC   chlorhexidine  15 mL  Mouth Rinse BID   Chlorhexidine Gluconate Cloth  6 each Topical Daily   divalproex  125 mg Oral Q12H   feeding supplement (KATE FARMS STANDARD 1.4)  325 mL Oral BID BM   hydrocortisone cream   Topical TID   insulin aspart  0-5 Units Subcutaneous QHS   insulin aspart  0-9 Units Subcutaneous TID WC   insulin glargine  6 Units Subcutaneous QHS   mouth rinse  15 mL Mouth Rinse q12n4p   multivitamin with minerals  1 tablet Oral Daily   polyethylene glycol  17 g Oral BID   rosuvastatin  5 mg Oral Daily   sacubitril-valsartan  1 tablet Oral BID   sodium chloride flush  10-40 mL Intracatheter Q12H   sorbitol, milk of mag, mineral oil, glycerin (SMOG) enema  960 mL Rectal Once   Continuous Infusions:    LOS: 5 days    Time spent: 35 min    Berle Mull, MD [facility]  If 7PM-7AM, please contact night-coverage  07/03/2020, 7:21 PM

## 2020-07-03 NOTE — Progress Notes (Signed)
Progress Note   Subjective  Patient and wife present this AM. He denies any abdominal pain. Had another enema yesterday, had several BMs that were loose. Slowing down this AM. He states he is overall improved. PO intake continues to be decreased. Nursing denies any blood in the stools, Hgb stable.    Objective   Vital signs in last 24 hours: Temp:  [97.9 F (36.6 C)-99.2 F (37.3 C)] 98.3 F (36.8 C) (07/19 0800) Pulse Rate:  [61-68] 62 (07/19 0600) Resp:  [12-25] 25 (07/19 0600) BP: (132-184)/(63-87) 152/81 (07/19 0600) SpO2:  [98 %-100 %] 99 % (07/19 0500) Last BM Date: 07/02/20 General:    white male in NAD Heart:  Regular rate and rhythm Lungs: Respirations even and unlabored Abdomen:  Soft, nontender and nondistended.  Extremities:  Without edema. Psych:  Cooperative. Normal mood and affect.  Intake/Output from previous day: 07/18 0701 - 07/19 0700 In: 2437.7 [P.O.:290; I.V.:2098.4; IV Piggyback:49.3] Out: 1450 [Urine:1450] Intake/Output this shift: No intake/output data recorded.  Lab Results: Recent Labs    07/01/20 0500 07/01/20 1920 07/03/20 0500  WBC 4.2 3.7* 2.4*  HGB 9.4* 9.4* 9.3*  HCT 28.2* 28.6* 28.8*  PLT 129* 150 PLATELET CLUMPS NOTED ON SMEAR, UNABLE TO ESTIMATE   BMET Recent Labs    07/01/20 1920 07/02/20 0430 07/03/20 0500  NA 133* 135 136  K 3.0* 3.6 3.2*  CL 103 101 100  CO2 25 25 29   GLUCOSE 196* 212* 159*  BUN 9 9 8   CREATININE 0.60* 0.49* 0.46*  CALCIUM 7.5* 7.7* 7.2*   LFT Recent Labs    07/03/20 0500  PROT 3.5*  ALBUMIN 1.7*  AST 17  ALT 9  ALKPHOS 64  BILITOT 0.6   PT/INR No results for input(s): LABPROT, INR in the last 72 hours.  Studies/Results: DG Abd Portable 1V  Result Date: 07/03/2020 CLINICAL DATA:  79 y.o. male with constipation. Medical history significant of CLL, DM, HTN, HLD, combined systolic/diastolic CHF EXAM: PORTABLE ABDOMEN - 1 VIEW COMPARISON:  Abdominal radiograph 07/01/2020 FINDINGS:  The far left hemiabdomen is excluded from field of view. The bowel gas pattern is nonobstructive. Moderate stool burden. No supine evidence for free air. No unexpected radiopaque calcification. No acute finding in the visualized skeleton. IMPRESSION: Nonobstructive bowel gas pattern. Moderate stool burden. Electronically Signed   By: Audie Pinto M.D.   On: 07/03/2020 08:34   DG Abd Portable 1V  Result Date: 07/01/2020 CLINICAL DATA:  Fecal impaction. EXAM: PORTABLE ABDOMEN - 1 VIEW COMPARISON:  February 02, 2018. FINDINGS: No abnormal bowel dilatation is noted. Mild amount of stool is noted in the rectum. No radio-opaque calculi or other significant radiographic abnormality are seen. IMPRESSION: Mild stool burden is noted. No evidence of bowel obstruction or ileus. Electronically Signed   By: Marijo Conception M.D.   On: 07/01/2020 15:34       Assessment / Plan:   79 y/o male with a history of CLL, severe diverticulosis, CHF, admitted to the hospital with rectal bleeding / diarrhea. Thought to have fecal impaction with overflow incontinence on admission, with bleeding likely due to stercoral ulcer vs. less likely diverticular bleed. He has been on an aggressive bowel regimen with Miralax twice daily, dulcolax suppository enema daily, and s/p 2 enemas with significant stool output. Bleeding has resolved, Hgb stable post transfusion, he feels much better. Xray noted today, still with some stool but he is clinically much better from bowel perspective. Hospital course  complicated by AF, followed by cardiology. Discussed long term plan with the patient and wife.   Recommend Miralax twice daily at baseline continued for now and can titrate up or down as needed moving forward. Would also continue with Dulcolax suppository daily for the next few days and then do this as needed moving forward. I don't think he needs another enema right now, can do this PRN in the future if things regress. No plans for  colonoscopy right now given bleeding seems to have stopped and he's had a relatively recent exam.  We will sign off for now, call with questions moving forward.   [place] Cellar, MD [facility]

## 2020-07-03 NOTE — Progress Notes (Signed)
Left IJ central line removed per protocol per physician order. Manual pressure applied for 5 mins. No bleeding or swelling noted. Instructed patient to remain in bed until 1800. Educated patient about S/S of infection and when to call MD; no heavy lifting or pressure on left side for 24 hours; keep dressing dry and intact until 1730 tomorrow. Pt verbalized comprehension.

## 2020-07-04 ENCOUNTER — Inpatient Hospital Stay (HOSPITAL_COMMUNITY): Payer: Medicare HMO

## 2020-07-04 DIAGNOSIS — R609 Edema, unspecified: Secondary | ICD-10-CM

## 2020-07-04 DIAGNOSIS — I480 Paroxysmal atrial fibrillation: Secondary | ICD-10-CM | POA: Diagnosis not present

## 2020-07-04 DIAGNOSIS — E1110 Type 2 diabetes mellitus with ketoacidosis without coma: Secondary | ICD-10-CM | POA: Diagnosis not present

## 2020-07-04 LAB — CBC
HCT: 31.4 % — ABNORMAL LOW (ref 39.0–52.0)
HCT: 32.7 % — ABNORMAL LOW (ref 39.0–52.0)
Hemoglobin: 10.4 g/dL — ABNORMAL LOW (ref 13.0–17.0)
Hemoglobin: 10.5 g/dL — ABNORMAL LOW (ref 13.0–17.0)
MCH: 30.4 pg (ref 26.0–34.0)
MCH: 29.9 pg (ref 26.0–34.0)
MCHC: 33.1 g/dL (ref 30.0–36.0)
MCHC: 32.1 g/dL (ref 30.0–36.0)
MCV: 91.8 fL (ref 80.0–100.0)
MCV: 93.2 fL (ref 80.0–100.0)
Platelets: 129 10*3/uL — ABNORMAL LOW (ref 150–400)
Platelets: 118 10*3/uL — ABNORMAL LOW (ref 150–400)
RBC: 3.42 MIL/uL — ABNORMAL LOW (ref 4.22–5.81)
RBC: 3.51 MIL/uL — ABNORMAL LOW (ref 4.22–5.81)
RDW: 18.3 % — ABNORMAL HIGH (ref 11.5–15.5)
RDW: 18.2 % — ABNORMAL HIGH (ref 11.5–15.5)
WBC: 3.2 10*3/uL — ABNORMAL LOW (ref 4.0–10.5)
WBC: 2.8 10*3/uL — ABNORMAL LOW (ref 4.0–10.5)
nRBC: 0 % (ref 0.0–0.2)
nRBC: 0 % (ref 0.0–0.2)

## 2020-07-04 LAB — GLUCOSE, CAPILLARY
Glucose-Capillary: 149 mg/dL — ABNORMAL HIGH (ref 70–99)
Glucose-Capillary: 206 mg/dL — ABNORMAL HIGH (ref 70–99)
Glucose-Capillary: 237 mg/dL — ABNORMAL HIGH (ref 70–99)
Glucose-Capillary: 207 mg/dL — ABNORMAL HIGH (ref 70–99)

## 2020-07-04 LAB — BASIC METABOLIC PANEL
Anion gap: 9 (ref 5–15)
BUN: 11 mg/dL (ref 8–23)
CO2: 28 mmol/L (ref 22–32)
Calcium: 7.8 mg/dL — ABNORMAL LOW (ref 8.9–10.3)
Chloride: 96 mmol/L — ABNORMAL LOW (ref 98–111)
Creatinine, Ser: 0.48 mg/dL — ABNORMAL LOW (ref 0.61–1.24)
GFR calc Af Amer: >60 mL/min (ref >60)
GFR calc non Af Amer: >60 mL/min (ref >60)
Glucose, Bld: 173 mg/dL — ABNORMAL HIGH (ref 70–99)
Potassium: 3.2 mmol/L — ABNORMAL LOW (ref 3.5–5.1)
Sodium: 133 mmol/L — ABNORMAL LOW (ref 135–145)

## 2020-07-04 LAB — MAGNESIUM: Magnesium: 1.7 mg/dL (ref 1.7–2.4)

## 2020-07-04 LAB — APTT: aPTT: 39 seconds — ABNORMAL HIGH (ref 24–36)

## 2020-07-04 MED ORDER — SACUBITRIL-VALSARTAN 49-51 MG PO TABS
1.0000 | ORAL_TABLET | Freq: Two times a day (BID) | ORAL | Status: DC
  Administered 2020-07-05 – 2020-07-11 (×13): 1 via ORAL
  Filled 2020-07-04 (×13): qty 1

## 2020-07-04 MED ORDER — SACUBITRIL-VALSARTAN 49-51 MG PO TABS
1.0000 | ORAL_TABLET | Freq: Two times a day (BID) | ORAL | Status: DC
  Filled 2020-07-04: qty 1

## 2020-07-04 MED ORDER — POLYETHYLENE GLYCOL 3350 17 G PO PACK
17.0000 g | PACK | Freq: Every day | ORAL | Status: DC
  Filled 2020-07-04: qty 1

## 2020-07-04 MED ORDER — BISACODYL 10 MG RE SUPP: 10.0000 mg | Freq: Every day | RECTAL | Status: DC | PRN

## 2020-07-04 MED ORDER — ZINC OXIDE 40 % EX OINT
TOPICAL_OINTMENT | CUTANEOUS | Status: DC | PRN
  Filled 2020-07-04: qty 57

## 2020-07-04 MED ORDER — HEPARIN (PORCINE) 25000 UT/250ML-% IV SOLN
1000.0000 [IU]/h | INTRAVENOUS | Status: DC
  Administered 2020-07-04: 1000 [IU]/h via INTRAVENOUS
  Filled 2020-07-04: qty 250

## 2020-07-04 MED ORDER — POTASSIUM CHLORIDE CRYS ER 20 MEQ PO TBCR
40.0000 meq | EXTENDED_RELEASE_TABLET | ORAL | Status: AC
  Administered 2020-07-04 (×2): 40 meq via ORAL
  Filled 2020-07-04 (×2): qty 2

## 2020-07-04 MED ORDER — MAGNESIUM OXIDE 400 (241.3 MG) MG PO TABS
400.0000 mg | ORAL_TABLET | Freq: Every day | ORAL | Status: DC
  Administered 2020-07-05 – 2020-07-11 (×7): 400 mg via ORAL
  Filled 2020-07-04 (×7): qty 1

## 2020-07-04 MED ORDER — MAGNESIUM SULFATE 2 GM/50ML IV SOLN
2.0000 g | Freq: Once | INTRAVENOUS | Status: AC
  Administered 2020-07-04: 2 g via INTRAVENOUS
  Filled 2020-07-04: qty 50

## 2020-07-04 MED ORDER — CARVEDILOL 12.5 MG PO TABS
12.5000 mg | ORAL_TABLET | Freq: Two times a day (BID) | ORAL | Status: DC
  Administered 2020-07-05 – 2020-07-11 (×13): 12.5 mg via ORAL
  Filled 2020-07-04 (×13): qty 1

## 2020-07-04 NOTE — Progress Notes (Signed)
Heparin drip was started at 5:20pm per orders. At 6:50pm pt had large incontinent stool maroon in color per NT and family.  Following the maroon colored stool, pt had 2nd incontinent stool of bright red blood.  Dr. Posey Pronto notified of the above and orders were received.  Heparin stopped as per orders and pharmacist notified. Dr. Posey Pronto arrived at bedside to assess pt. Derinda Bartus, Laurel Dimmer, RN

## 2020-07-04 NOTE — Progress Notes (Signed)
Report received from Almon Hercules.  No change in previous assessment.  Pt denies any needs at this time. Will continue plan of care. Ori Trejos, Laurel Dimmer, RN

## 2020-07-04 NOTE — Progress Notes (Signed)
ANTICOAGULATION CONSULT NOTE - Initial Consult  Pharmacy Consult for IV heparin Indication: DVT  Allergies  Allergen Reactions  . Glycopyrrolate Rash    Patient Measurements: Height: 5\' 7"  (170.2 cm) Weight: 61.7 kg (136 lb 0.4 oz) IBW/kg (Calculated) : 66.1 Heparin Dosing Weight: actual body weight   Vital Signs: Temp: 98.5 F (36.9 C) (07/20 1305) Temp Source: Oral (07/20 1305) BP: 145/74 (07/20 1305) Pulse Rate: 63 (07/20 1305)  Labs: Recent Labs    07/01/20 1920 07/01/20 1920 07/02/20 0430 07/03/20 0500 07/04/20 0707  HGB 9.4*   < >  --  9.3* 10.4*  HCT 28.6*  --   --  28.8* 31.4*  PLT 150  --   --  PLATELET CLUMPS NOTED ON SMEAR, UNABLE TO ESTIMATE 129*  CREATININE 0.60*   < > 0.49* 0.46* 0.48*   < > = values in this interval not displayed.    Estimated Creatinine Clearance: 68.6 mL/min (A) (by C-G formula based on SCr of 0.48 mg/dL (L)).   Medical History: Past Medical History:  Diagnosis Date  . A-fib ([place])   . CLL (chronic lymphocytic leukemia) ([place]) dx'd 2014   progression 12/2019  . Diabetes mellitus without complication ([place])   . Diverticulosis   . External hemorrhoids   . GERD (gastroesophageal reflux disease)   . Hiatal hernia   . History of colon polyps   . Hyperlipidemia   . Hypertension   . Internal hemorrhoids   . Lymphocytosis   . Schatzki's ring   . Sleep apnea     Medications:  Aspirin 81mg  daily PTA No anticoagulants PTA  Assessment: 11 y/oM with PMH of CLL, HTN, HLD, severe diverticulosis, CHF admitted to the hospital with rectal bleeding/diarrhea. Patient was seen by GI, felt to have fecal impaction with stercoral ulcer/hemorrhoids and recommended aggressive bowel regimen. Hospital course complicated by new onset a-fib with RVR-not anticoagulated secondary to GI bleeding. Today, upper extremity venous duplex + for RUE DVT. Pharmacy consulted to now start IV heparin drip with NO bolus per MD. Hgb 10.4 today-remained stable for  last 3 days. Plt low at 129K.   Goal of Therapy:  Heparin level 0.3-0.5 units/ml (targeting lower end of goal range given recent GIB requiring transfusion)  Monitor platelets by anticoagulation protocol: Yes   Plan:  Heparin infusion at 1000 units/hr (NO bolus per MD order) Heparin level 8 hours after initiation Daily CBC, heparin level Monitor closely for s/sx of bleeding   Lindell Spar, PharmD, BCPS Clinical Pharmacist  07/04/2020,5:09 PM   Addendum: Patient had maroon colored stool, followed by 2nd incontinent stool of bright red blood. Heparin infused for ~ 1.5 hours.   MD discontinued heparin infusion and will assess further.   Lindell Spar, PharmD, BCPS Clinical Pharmacist  07/04/2020 7:00 PM

## 2020-07-04 NOTE — Progress Notes (Addendum)
[facility] Progress Note  Patient: Tony Marshall    HUD:[id number]  DOA: 06/28/2020     Date of Service: the patient was seen and examined on 07/04/2020  Brief hospital course: Past medical history of CLL, type II DM, HTN, HLD, chronic combined CHF.  Presents with hematochezia.  Went to see outpatient GI but prior to that patient was brought to the ER.  Found to have DKA along with anemia.  Initially admitted to the ICU.  Treated with IV insulin and total protocol with resolution of anion gap. Developed A. fib with RVR for which cardiology was consulted and patient was started on IV amiodarone. Patient's GI bleed was thought to be fecal impaction causing stercoral ulcer.  Treated conservatively with transfusion support without any procedure. Hemoglobin was stable.  No further was bleeding.  After that found to have right subclavian vein DVT for which patient was started on heparin and then started having bleeding again.  Currently plan is close monitoring in the progressive care unit with CBC every 4 hours and transfuse for hemoglobin less than 8.  N.p.o. for now.  Assessment and Plan: 1.  Lower GI bleed Likely stercoral ulcer Acute blood loss anemia Presents with hematochezia in ED. Seen by [facility].  Based on imaging and rectal examination felt that there is associated with fecal impaction and stercoral ulcer as well as hemorrhoids. Patient was transfused. Hemoglobin remained stable since admission no further bleeding reported as well. GI initially signed off. After receiving 1.5 hours of 1000 unit/h heparin without any bolus for DVT treatment patient started having hematochezia again on 07/04/2020. Currently n.p.o., CBC every 4 hours, transfuse for hemoglobin less than 8.  GI reconsulted and will evaluate the patient again tomorrow. Discussed with pharmacy, no indication to reverse the heparin given the short duration and low-dose.  2.  DKA, POA Type 2 diabetes mellitus,  uncontrolled with hyperglycemia with hyperlipidemia Seen on admission. Treated with IV insulin with Endo tool. Anion gap closed. Currently has mild hyperglycemia. Currently on sliding scale insulin. Diabetes educator following.  3.  New onset A. fib with RVR Seen while in the ICU. Due to hypotension patient was started on IV digoxin but then on IV amiodarone. Currently rate is well controlled on a combination of amiodarone and Coreg. Appreciate cardiology assistance. No anticoagulation given his GI bleed.  4.  Chronic combined systolic and diastolic CHF Appears compensated for now. EF 25% on the echocardiogram. On Coreg and Entresto. Remains at risk for poor outcome should the patient require any intervention.  5.  Prolonged QTC QT currently stable.  Monitor.  6.  Hypokalemia, hypomagnesemia, hypocalcemia. Currently corrected. Monitor.  7.  CLL. Community-acquired pneumonia  patient was started on IV ceftriaxone and azithromycin.  Son currently on IV Unasyn. Monitor for now.  8.  Hyperlipidemia Continue statin.  9.  Acute metabolic encephalopathy. Delirium in the setting of Sirs-like picture. Continue Depakote.  10.  right subclavian DVT. Patient reported swelling of his right upper extremity without any pain. Ultrasound Doppler positive for age indeterminate right upper extremity DVT. Patient was started on IV heparin without any bolus. Given that the patient has recurrence of his GI bleed currently holding off on heparin. We will follow serial Dopplers.  11.  Fecal impaction. Aggressive bowel regimen was initiated. Due to now persistent diarrhea currently I will be changing Dulcolax to as needed and changing MiraLAX to daily only.  Body mass index is 21.3 kg/m.  Nutrition Problem: Increased nutrient needs  Etiology: acute illness, chronic illness, cancer and cancer related treatments Interventions: Interventions: MVI, Prostat, Other (Comment) [employer])  Diet: N.p.o. for now. DVT Prophylaxis:   Place and maintain sequential compression device Start: 06/28/20 1247    Advance goals of care discussion: Full code  Family Communication: family was present at bedside, at the time of interview.  The pt provided permission to discuss medical plan with the family. Opportunity was given to ask question and all questions were answered satisfactorily.   Disposition:  Status is: Inpatient  Remains inpatient appropriate because:Hemodynamically unstable, Ongoing diagnostic testing needed not appropriate for outpatient work up, Unsafe d/c plan, IV treatments appropriate due to intensity of illness or inability to take PO and Inpatient level of care appropriate due to severity of illness   Dispo: The patient is from: Home              Anticipated d/c is to: SNF              Anticipated d/c date is: 2 days              Patient currently is not medically stable to d/c.        Subjective: No nausea no vomiting.  No fever no chills.  Reported swelling of his right upper arm. Patient was started on IV heparin and started having hematochezia and BRBPR in the afternoon. Denies any abdominal pain. Reports continues to have diarrhea throughout the day  Physical Exam:  General: Appear in mild distress, no Rash; Oral Mucosa Clear, moist. no Abnormal Neck Mass Or lumps, Conjunctiva normal  Cardiovascular: S1 and S2 Present, no Murmur, Respiratory: good respiratory effort, Bilateral Air entry present and Clear to Auscultation, no Crackles, no wheezes Abdomen: Bowel Sound present, Soft and no tenderness Extremities: Right upper extremity edema, no pedal edema, no calf tenderness Neurology: alert and oriented to time, place, and person affect appropriate. no new focal deficit Gait not checked due to patient safety concerns  Vitals:   07/04/20 0747 07/04/20 1305 07/04/20 1857 07/04/20 1922  BP: (!) 173/71 (!) 145/74 (!) 157/72 (!) 159/75   Pulse: 60 63 (!) 59 (!) 59  Resp: 15 16 20 13   Temp: 99.2 F (37.3 C) 98.5 F (36.9 C) 98.8 F (37.1 C) 98.7 F (37.1 C)  TempSrc: Oral Oral Oral Oral  SpO2: 99% 98% 100% 100%  Weight:      Height:        Intake/Output Summary (Last 24 hours) at 07/04/2020 1955 Last data filed at 07/04/2020 1855 Gross per 24 hour  Intake 15.06 ml  Output 1150 ml  Net -1134.94 ml   Filed Weights   06/28/20 0635 06/28/20 2100 07/04/20 0507  Weight: 68 kg 57.3 kg 61.7 kg    Data Reviewed: I have personally reviewed and interpreted daily labs, tele strips, imagings as discussed above. I reviewed all nursing notes, pharmacy notes, vitals, pertinent old records I have discussed plan of care as described above with RN and patient/family.  CBC: Recent Labs  Lab 06/28/20 0722 06/28/20 1325 06/30/20 2000 07/01/20 0500 07/01/20 1920 07/03/20 0500 07/04/20 0707  WBC 5.6   < > 4.3 4.2 3.7* 2.4* 3.2*  NEUTROABS 3.8  --   --   --   --  1.6*  --   HGB 8.6*   < > 6.0* 9.4* 9.4* 9.3* 10.4*  HCT 26.4*   < > 18.8* 28.2* 28.6* 28.8* 31.4*  MCV 97.4   < > 96.9 92.2  91.7 92.6 91.8  PLT 178   < > 138* 129* 150 PLATELET CLUMPS NOTED ON SMEAR, UNABLE TO ESTIMATE 129*   < > = values in this interval not displayed.   Basic Metabolic Panel: Recent Labs  Lab 06/30/20 2000 06/30/20 2000 07/01/20 0500 07/01/20 1920 07/02/20 0430 07/03/20 0500 07/04/20 0707  NA 131*   < > 132* 133* 135 136 133*  K 3.4*   < > 3.1* 3.0* 3.6 3.2* 3.2*  CL 102   < > 101 103 101 100 96*  CO2 25   < > 25 25 25 29 28   GLUCOSE 209*   < > 93 196* 212* 159* 173*  BUN 7*   < > 7* 9 9 8 11   CREATININE 0.55*   < > 0.46* 0.60* 0.49* 0.46* 0.48*  CALCIUM 7.4*   < > 7.7* 7.5* 7.7* 7.2* 7.8*  MG 1.5*  --  1.6* 1.5* 1.7  --  1.7   < > = values in this interval not displayed.    Studies: VAS [place] UPPER EXTREMITY VENOUS DUPLEX  Result Date: 07/04/2020 UPPER VENOUS STUDY  Indications: Edema Risk Factors: None identified. Comparison  Study: No prior studies. Performing Technologist: Oliver Hum RVT  Examination Guidelines: A complete evaluation includes B-mode imaging, spectral Doppler, color Doppler, and power Doppler as needed of all accessible portions of each vessel. Bilateral testing is considered an integral part of a complete examination. Limited examinations for reoccurring indications may be performed as noted.  Right Findings: +----------+------------+---------+-----------+----------+-----------------+ RIGHT     CompressiblePhasicitySpontaneousProperties     Summary      +----------+------------+---------+-----------+----------+-----------------+ IJV           Full       Yes       Yes                                +----------+------------+---------+-----------+----------+-----------------+ Subclavian  Partial      No        No               Age Indeterminate +----------+------------+---------+-----------+----------+-----------------+ Axillary      Full       Yes       Yes                                +----------+------------+---------+-----------+----------+-----------------+ Brachial      Full       Yes       Yes                                +----------+------------+---------+-----------+----------+-----------------+ Radial        Full                                                    +----------+------------+---------+-----------+----------+-----------------+ Ulnar         Full                                                    +----------+------------+---------+-----------+----------+-----------------+ Cephalic  Full                                                    +----------+------------+---------+-----------+----------+-----------------+ Basilic       Full                                                    +----------+------------+---------+-----------+----------+-----------------+  Left Findings:  +----------+------------+---------+-----------+----------+-------+ LEFT      CompressiblePhasicitySpontaneousPropertiesSummary +----------+------------+---------+-----------+----------+-------+ Subclavian    Full       Yes       Yes                      +----------+------------+---------+-----------+----------+-------+  Summary:  Right: No evidence of superficial vein thrombosis in the upper extremity. 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  Left: No evidence of thrombosis in the subclavian.  *See table(s) above for measurements and observations.  Diagnosing physician: Servando Snare MD Electronically signed by Servando Snare MD on 07/04/2020 at 5:29:09 PM.    Final     Scheduled Meds: . sodium chloride   Intravenous Once  . sodium chloride   Intravenous Once  . amiodarone  200 mg Oral BID   Followed by  . [START ON 07/09/2020] amiodarone  200 mg Oral Daily  . [START ON 07/05/2020] carvedilol  12.5 mg Oral BID WC  . chlorhexidine  15 mL Mouth Rinse BID  . Chlorhexidine Gluconate Cloth  6 each Topical Daily  . divalproex  125 mg Oral Q12H  . feeding supplement ([place] [place] STANDARD 1.4)  325 mL Oral BID BM  . hydrocortisone cream   Topical TID  . insulin aspart  0-5 Units Subcutaneous QHS  . insulin aspart  0-9 Units Subcutaneous TID WC  . insulin glargine  6 Units Subcutaneous QHS  . [START ON 07/05/2020] magnesium oxide  400 mg Oral Daily  . mouth rinse  15 mL Mouth Rinse q12n4p  . multivitamin with minerals  1 tablet Oral Daily  . [START ON 07/05/2020] polyethylene glycol  17 g Oral Daily  . rosuvastatin  5 mg Oral Daily  . [START ON 07/05/2020] sacubitril-valsartan  1 tablet Oral BID  . sodium chloride flush  10-40 mL Intracatheter Q12H   Continuous Infusions: PRN Meds: acetaminophen **OR** acetaminophen, bisacodyl, liver oil-zinc oxide, sodium chloride flush, traMADol  Time spent: The patient is critically ill with  multiple organ systems failure and requires high complexity decision making for assessment and support, frequent evaluation and titration of therapies. Critical Care Time devoted to patient care services described in this note is 35 minutes   Author: Berle Mull, MD [facility] 07/04/2020 7:55 PM  To reach On-call, see care teams to locate the attending and reach out via [email or web address]. Between 7PM-7AM, please contact night-coverage If you still have difficulty reaching the attending provider, please page the [facility] (Director on Call) for [facility] on amion for assistance.

## 2020-07-04 NOTE — Progress Notes (Signed)
Right upper extremity venous duplex has been completed. Preliminary results can be found in CV Proc through chart review.  Results were given to the patient's nurse, Hinton Dyer.  07/04/20 3:28 PM Tony Marshall RVT

## 2020-07-04 NOTE — Progress Notes (Addendum)
Progress Note  Patient Name: Tony Marshall Date of Encounter: 07/04/2020  Primary Cardiologist: Skeet Latch, MD  Subjective   Feeling good, no complaints, no CP or SOB. Maintaining NSR on telemetry.  Inpatient Medications    Scheduled Meds: . sodium chloride   Intravenous Once  . sodium chloride   Intravenous Once  . amiodarone  200 mg Oral BID   Followed by  . [START ON 07/09/2020] amiodarone  200 mg Oral Daily  . bisacodyl  10 mg Rectal Daily  . carvedilol  12.5 mg Oral BID WC  . chlorhexidine  15 mL Mouth Rinse BID  . Chlorhexidine Gluconate Cloth  6 each Topical Daily  . divalproex  125 mg Oral Q12H  . feeding supplement (KATE FARMS STANDARD 1.4)  325 mL Oral BID BM  . hydrocortisone cream   Topical TID  . insulin aspart  0-5 Units Subcutaneous QHS  . insulin aspart  0-9 Units Subcutaneous TID WC  . insulin glargine  6 Units Subcutaneous QHS  . mouth rinse  15 mL Mouth Rinse q12n4p  . multivitamin with minerals  1 tablet Oral Daily  . polyethylene glycol  17 g Oral BID  . potassium chloride  40 mEq Oral Q2H  . rosuvastatin  5 mg Oral Daily  . sacubitril-valsartan  1 tablet Oral BID  . sodium chloride flush  10-40 mL Intracatheter Q12H  . sorbitol, milk of mag, mineral oil, glycerin (SMOG) enema  960 mL Rectal Once   Continuous Infusions:  PRN Meds: acetaminophen **OR** acetaminophen, dextrose, sodium chloride flush, traMADol   Vital Signs    Vitals:   07/03/20 2153 07/04/20 0507 07/04/20 0507 07/04/20 0747  BP: (!) 167/76 (!) 168/78 (!) 168/78 (!) 173/71  Pulse: 62 62 62 60  Resp: 17 17 17    Temp: 99.4 F (37.4 C) 99.1 F (37.3 C) 99.1 F (37.3 C) 99.2 F (37.3 C)  TempSrc: Oral Oral Oral Oral  SpO2: 100%  100% 99%  Weight:   61.7 kg   Height:        Intake/Output Summary (Last 24 hours) at 07/04/2020 0813 Last data filed at 07/04/2020 0519 Gross per 24 hour  Intake 293.81 ml  Output 1375 ml  Net -1081.19 ml   Last 3 Weights 07/04/2020  06/28/2020 06/28/2020  Weight (lbs) 136 lb 0.4 oz 126 lb 5.2 oz 150 lb  Weight (kg) 61.7 kg 57.3 kg 68.04 kg     Telemetry    NSR 60s - Personally Reviewed  Physical Exam   GEN: No acute distress.  HEENT: Normocephalic, atraumatic, sclera non-icteric. Neck: No JVD or bruits. Cardiac: RRR no murmurs, rubs, or gallops.  Radials/DP/PT 1+ and equal bilaterally.  Respiratory: Clear to auscultation bilaterally. Breathing is unlabored. GI: Soft, nontender, non-distended, BS +x 4. MS: no deformity. Extremities: No clubbing or cyanosis. No edema. Distal pedal pulses are 2+ and equal bilaterally. Neuro:  AAOx3. Follows commands. Psych:  Responds to questions appropriately with a normal affect.  Labs    High Sensitivity Troponin:   Recent Labs  Lab 06/05/20 1710  TROPONINIHS 11      Cardiac EnzymesNo results for input(s): TROPONINI in the last 168 hours. No results for input(s): TROPIPOC in the last 168 hours.   Chemistry Recent Labs  Lab 06/28/20 0722 06/28/20 1325 06/29/20 0247 06/30/20 0457 07/02/20 0430 07/03/20 0500 07/04/20 0707  NA 137   < > 136   < > 135 136 133*  K 3.2*   < >  3.2*   < > 3.6 3.2* 3.2*  CL 100   < > 102   < > 101 100 96*  CO2 18*   < > 23   < > 25 29 28   GLUCOSE 252*   < > 74   < > 212* 159* 173*  BUN 12   < > 10   < > 9 8 11   CREATININE 0.70   < > 0.47*   < > 0.49* 0.46* 0.48*  CALCIUM 8.2*   < > 8.0*   < > 7.7* 7.2* 7.8*  PROT 4.3*  --  5.0*  --   --  3.5*  --   ALBUMIN 2.2*  --  2.6*  --   --  1.7*  --   AST 13*  --  17  --   --  17  --   ALT 10  --  10  --   --  9  --   ALKPHOS 63  --  73  --   --  64  --   BILITOT 1.6*  --  1.5*  --   --  0.6  --   GFRNONAA >60   < > >60   < > >60 >60 >60  GFRAA >60   < > >60   < > >60 >60 >60  ANIONGAP 19*   < > 11   < > 9 7 9    < > = values in this interval not displayed.     Hematology Recent Labs  Lab 07/01/20 1920 07/03/20 0500 07/04/20 0707  WBC 3.7* 2.4* 3.2*  RBC 3.12* 3.11* 3.42*  HGB  9.4* 9.3* 10.4*  HCT 28.6* 28.8* 31.4*  MCV 91.7 92.6 91.8  MCH 30.1 29.9 30.4  MCHC 32.9 32.3 33.1  RDW 18.2* 18.7* 18.3*  PLT 150 PLATELET CLUMPS NOTED ON SMEAR, UNABLE TO ESTIMATE 129*    BNPNo results for input(s): BNP, PROBNP in the last 168 hours.   DDimer No results for input(s): DDIMER in the last 168 hours.   Radiology    DG Abd Portable 1V  Result Date: 07/03/2020 CLINICAL DATA:  79 y.o. male with constipation. Medical history significant of CLL, DM, HTN, HLD, combined systolic/diastolic CHF EXAM: PORTABLE ABDOMEN - 1 VIEW COMPARISON:  Abdominal radiograph 07/01/2020 FINDINGS: The far left hemiabdomen is excluded from field of view. The bowel gas pattern is nonobstructive. Moderate stool burden. No supine evidence for free air. No unexpected radiopaque calcification. No acute finding in the visualized skeleton. IMPRESSION: Nonobstructive bowel gas pattern. Moderate stool burden. Electronically Signed   By: Audie Pinto M.D.   On: 07/03/2020 08:34    Cardiac Studies   Echo 06/07/20 1. Left ventricular ejection fraction, by estimation, is 25 to 30%. The  left ventricle has severely decreased function. The left ventricle has no  regional wall motion abnormalities. Left ventricular diastolic parameters  are consistent with Grade II  diastolic dysfunction (pseudonormalization).  2. Right ventricular systolic function is normal. The right ventricular  size is normal. There is normal pulmonary artery systolic pressure.  3. The mitral valve is normal in structure. No evidence of mitral valve  regurgitation. No evidence of mitral stenosis.  4. The aortic valve is grossly normal. Aortic valve regurgitation is not  visualized. No aortic stenosis is present.  5. The inferior vena cava is normal in size with greater than 50%  respiratory variability, suggesting right atrial pressure of 3 mmHg.  Patient Profile  79 y.o. male with history of CLL, DM, OSA, HTN, HLD,prior  habitual alcohol use,GERD, Schatzki's ring, prolonged QTc, recently diagnosed cardiomyopathy in the context of acute illness. Tumultuous course recently related to CLL, initiation of chemotherapy, dehydration, weight loss, constipation and subsequent diarrhea after laxatives. He was recently admitted6/21/2021with confusion, weakness, and fatigue, felt to have septic shock in setting of RLL cellulitis.He was noted to be hyponatremic, hypokalemic, hypomagnesemic, febrile, in metabolic acidosis with anion gap and lactic acidosis, with AKI, decreased TSH, toxic metabolic encephalopathy, prolonged QT, and chemo-induced neutropenia. Co-ox was relatively normal, arguing against cardiogenic etiology of shock.2D echo was obtained which showed severe LV dysfunction with EF 25-30%, grade 2 DD, normal RV, therefore cardiology consulted - plan to re-eval as outpatient for [facility] when more medically stable. He was readmitted 06/28/2020 with diarrhea and maroon colored stools with clots, found to have GIB and DKA. Felt to have possible fecal impaction with stercoral ulcer/hemorrhoids, being treated by GI. Hospital course complicated by extreme agitation/delirium where patient was combative/hitting staff members. Cardiology following for new onset atrial fibrillation with RVR.   Assessment & Plan    1. Acute GI bleed with ABL anemia in the context of above medical illness with DKA, recent treatment for CLL. Felt possibly from stercoral colitiis with fecal impaction. Hgb nadir 6. - Hgb stable at 10.4 today - Per IM/GI  2. Atrial fibrillation with RVR, occurring newly in the metabolic stressor context of DKA, hypokalemia (K 2.9) and GI bleed. He was placed on IV amiodarone with conversion to NSR. CHADSVASC 6 for CHF, HTN, age x2, DM, vascular disease (coronary calcification on CT).  - not currently a candidate for anticoagulation due to GIB - transitioned to oral amiodarone yesterday with plan for 200mg  BID through  7/24, then begin 200mg  daily on 07/09/20 - if he continues to maintain NSR, it may be reasonable to hold off anticoagulation discussions moving forward pending outpatient reassessment in case atrial fib was isolated just to acute hospitalization issues  3. Recently abnormal thyroid function - TSH was 0.200 during last hospital stay, but with normal free T4. Recheck TSH wnl but free T4 marginally elevated. - per IM  4. Chronic combined CHF/cardiomyopathy - diagnosed last admission, etiology not yet known. R/LHC was to be pursued when medically stable. Still on deferral for now given acute issues, anticipate revisiting once stable as outpatient. Continue medical therapy - no discharge weight from recent hospitalization (was 141lb on 7/3, at 136lb today) - continue carvedilol (HR 60s) - Entresto restarted 7/18 - volume status looks good - will discuss med plan with MD, considering increasing Entresto but hypokalemia being actively managed  5. Essential HTN - initially hypotensive, then regained BP with clinical improvement. Managed in context of above  6. Prolonged QTc - this was a problem last admission as well.  - repeat QTC 466ms on telemetry  - avoid QT prolonging agents - seems stable on amiodarone, but would avoid outpatient meds that have this potential (I.e. loperamide, tramadol) - optimize lytes as below  7. Electrolyte abnormalities that include hypokalemia, hypomagnesemia, hypocalcemia - calcium corrects to near normal when accounting for hypoalbuminemia - remains hypokalemic; potassium ordered by primary team  - Mg 1.7, do not see repletion ordered therefore will give 2g mag sulfate this AM and start MagOx 400mg  daily tomorrow - labs ordered for AM - may need standing repletion at home  Have tentatively scheduled f/u 07/25/20 with Dr. Oval Linsey. Patient should optimally see PCP/medical team within 1 week of  dc.  For questions or updates, please contact [place] Please  consult [email or web address] for contact info under Cardiology/STEMI.  Signed, Charlie Pitter, PA-C 07/04/2020, 8:13 AM    Patient seen and examined with Melina Copa PA-C.  Agree as above, with the following exceptions and changes as noted below. No CP or SOB. Gen: NAD, CV: RRR, no murmurs, Lungs: clear, Abd: soft, Extrem: Warm, well perfused, no edema, Neuro/Psych: alert and oriented x 3, normal mood and affect. All available labs, radiology testing, previous records reviewed. Maintaining SR. Will uptitrate entresto today with mild BP elevation and normal Cr. Replete potassium today. Appears euvolemic.   Elouise Munroe, MD 07/04/20 2:42 PM

## 2020-07-04 NOTE — Progress Notes (Signed)
Dr. Posey Pronto informed that pt is positive for DVT in right subclavian per Vascular Lab. MD acknowledged results. Tennis Mckinnon, Laurel Dimmer, RN

## 2020-07-05 ENCOUNTER — Encounter (HOSPITAL_COMMUNITY): Payer: Self-pay | Admitting: Internal Medicine

## 2020-07-05 ENCOUNTER — Inpatient Hospital Stay (HOSPITAL_COMMUNITY): Payer: Medicare HMO | Admitting: Certified Registered Nurse Anesthetist

## 2020-07-05 ENCOUNTER — Encounter (HOSPITAL_COMMUNITY)
Admission: EM | Disposition: A | Discharge status: Home / Self Care | Payer: Self-pay | Source: Home / Self Care | Attending: Family Medicine

## 2020-07-05 ENCOUNTER — Ambulatory Visit: Payer: Medicare HMO | Admitting: Physical Therapy

## 2020-07-05 DIAGNOSIS — Z789 Other specified health status: Secondary | ICD-10-CM | POA: Diagnosis not present

## 2020-07-05 DIAGNOSIS — C9110 Chronic lymphocytic leukemia of B-cell type not having achieved remission: Secondary | ICD-10-CM | POA: Diagnosis not present

## 2020-07-05 DIAGNOSIS — D62 Acute posthemorrhagic anemia: Secondary | ICD-10-CM

## 2020-07-05 DIAGNOSIS — I480 Paroxysmal atrial fibrillation: Secondary | ICD-10-CM | POA: Diagnosis not present

## 2020-07-05 DIAGNOSIS — E1110 Type 2 diabetes mellitus with ketoacidosis without coma: Secondary | ICD-10-CM | POA: Diagnosis not present

## 2020-07-05 DIAGNOSIS — K626 Ulcer of anus and rectum: Secondary | ICD-10-CM

## 2020-07-05 HISTORY — PX: FLEXIBLE SIGMOIDOSCOPY: SHX5431

## 2020-07-05 LAB — GLUCOSE, CAPILLARY
Glucose-Capillary: 154 mg/dL — ABNORMAL HIGH (ref 70–99)
Glucose-Capillary: 156 mg/dL — ABNORMAL HIGH (ref 70–99)
Glucose-Capillary: 214 mg/dL — ABNORMAL HIGH (ref 70–99)
Glucose-Capillary: 178 mg/dL — ABNORMAL HIGH (ref 70–99)

## 2020-07-05 LAB — CBC
HCT: 30.7 % — ABNORMAL LOW (ref 39.0–52.0)
HCT: 29.5 % — ABNORMAL LOW (ref 39.0–52.0)
HCT: 29 % — ABNORMAL LOW (ref 39.0–52.0)
HCT: 29 % — ABNORMAL LOW (ref 39.0–52.0)
HCT: 30.8 % — ABNORMAL LOW (ref 39.0–52.0)
Hemoglobin: 10 g/dL — ABNORMAL LOW (ref 13.0–17.0)
Hemoglobin: 9.6 g/dL — ABNORMAL LOW (ref 13.0–17.0)
Hemoglobin: 9.5 g/dL — ABNORMAL LOW (ref 13.0–17.0)
Hemoglobin: 9.6 g/dL — ABNORMAL LOW (ref 13.0–17.0)
Hemoglobin: 10.1 g/dL — ABNORMAL LOW (ref 13.0–17.0)
MCH: 29.9 pg (ref 26.0–34.0)
MCH: 30.2 pg (ref 26.0–34.0)
MCH: 30.3 pg (ref 26.0–34.0)
MCH: 30.5 pg (ref 26.0–34.0)
MCH: 29.8 pg (ref 26.0–34.0)
MCHC: 32.6 g/dL (ref 30.0–36.0)
MCHC: 32.5 g/dL (ref 30.0–36.0)
MCHC: 32.8 g/dL (ref 30.0–36.0)
MCHC: 33.1 g/dL (ref 30.0–36.0)
MCHC: 32.8 g/dL (ref 30.0–36.0)
MCV: 91.9 fL (ref 80.0–100.0)
MCV: 92.8 fL (ref 80.0–100.0)
MCV: 92.4 fL (ref 80.0–100.0)
MCV: 92.1 fL (ref 80.0–100.0)
MCV: 90.9 fL (ref 80.0–100.0)
Platelets: 126 10*3/uL — ABNORMAL LOW (ref 150–400)
Platelets: 109 10*3/uL — ABNORMAL LOW (ref 150–400)
Platelets: 118 10*3/uL — ABNORMAL LOW (ref 150–400)
Platelets: 110 10*3/uL — ABNORMAL LOW (ref 150–400)
Platelets: 116 10*3/uL — ABNORMAL LOW (ref 150–400)
RBC: 3.34 MIL/uL — ABNORMAL LOW (ref 4.22–5.81)
RBC: 3.18 MIL/uL — ABNORMAL LOW (ref 4.22–5.81)
RBC: 3.14 MIL/uL — ABNORMAL LOW (ref 4.22–5.81)
RBC: 3.15 MIL/uL — ABNORMAL LOW (ref 4.22–5.81)
RBC: 3.39 MIL/uL — ABNORMAL LOW (ref 4.22–5.81)
RDW: 18 % — ABNORMAL HIGH (ref 11.5–15.5)
RDW: 17.9 % — ABNORMAL HIGH (ref 11.5–15.5)
RDW: 18 % — ABNORMAL HIGH (ref 11.5–15.5)
RDW: 17.8 % — ABNORMAL HIGH (ref 11.5–15.5)
RDW: 17.4 % — ABNORMAL HIGH (ref 11.5–15.5)
WBC: 3.8 10*3/uL — ABNORMAL LOW (ref 4.0–10.5)
WBC: 2.4 10*3/uL — ABNORMAL LOW (ref 4.0–10.5)
WBC: 2.9 10*3/uL — ABNORMAL LOW (ref 4.0–10.5)
WBC: 2.4 10*3/uL — ABNORMAL LOW (ref 4.0–10.5)
WBC: 3.2 10*3/uL — ABNORMAL LOW (ref 4.0–10.5)
nRBC: 0 % (ref 0.0–0.2)
nRBC: 0 % (ref 0.0–0.2)
nRBC: 0 % (ref 0.0–0.2)
nRBC: 0 % (ref 0.0–0.2)
nRBC: 0 % (ref 0.0–0.2)

## 2020-07-05 LAB — TYPE AND SCREEN
ABO/RH(D): A POS
Antibody Screen: POSITIVE
DAT, IgG: POSITIVE

## 2020-07-05 LAB — BASIC METABOLIC PANEL
Anion gap: 8 (ref 5–15)
BUN: 12 mg/dL (ref 8–23)
CO2: 28 mmol/L (ref 22–32)
Calcium: 7.9 mg/dL — ABNORMAL LOW (ref 8.9–10.3)
Chloride: 95 mmol/L — ABNORMAL LOW (ref 98–111)
Creatinine, Ser: 0.46 mg/dL — ABNORMAL LOW (ref 0.61–1.24)
GFR calc Af Amer: >60 mL/min (ref >60)
GFR calc non Af Amer: >60 mL/min (ref >60)
Glucose, Bld: 182 mg/dL — ABNORMAL HIGH (ref 70–99)
Potassium: 3.5 mmol/L (ref 3.5–5.1)
Sodium: 131 mmol/L — ABNORMAL LOW (ref 135–145)

## 2020-07-05 LAB — MAGNESIUM: Magnesium: 1.7 mg/dL (ref 1.7–2.4)

## 2020-07-05 SURGERY — SIGMOIDOSCOPY, FLEXIBLE
Anesthesia: Monitor Anesthesia Care

## 2020-07-05 MED ORDER — MAGNESIUM CITRATE PO SOLN
0.5000 | Freq: Once | ORAL | Status: AC
  Administered 2020-07-05: 0.5 via ORAL
  Filled 2020-07-05: qty 296

## 2020-07-05 MED ORDER — PROPOFOL 10 MG/ML IV BOLUS
INTRAVENOUS | Status: DC | PRN
  Administered 2020-07-05: 20 mg via INTRAVENOUS

## 2020-07-05 MED ORDER — PROPOFOL 500 MG/50ML IV EMUL
INTRAVENOUS | Status: AC
  Filled 2020-07-05: qty 50

## 2020-07-05 MED ORDER — PROPOFOL 500 MG/50ML IV EMUL
INTRAVENOUS | Status: DC | PRN
  Administered 2020-07-05: 60 ug/kg/min via INTRAVENOUS

## 2020-07-05 MED ORDER — LACTATED RINGERS IV SOLN
INTRAVENOUS | Status: DC
  Administered 2020-07-05: 1000 mL via INTRAVENOUS

## 2020-07-05 MED ORDER — PSYLLIUM 95 % PO PACK
1.0000 | PACK | Freq: Every day | ORAL | Status: DC
  Administered 2020-07-05 – 2020-07-07 (×3): 1 via ORAL
  Filled 2020-07-05 (×3): qty 1

## 2020-07-05 MED ORDER — BARRIER CREAM NON-SPECIFIED
1.0000 "application " | TOPICAL_CREAM | Freq: Two times a day (BID) | TOPICAL | Status: DC
  Administered 2020-07-05 – 2020-07-11 (×12): 1 via TOPICAL
  Filled 2020-07-05: qty 1

## 2020-07-05 MED ORDER — PROPOFOL 10 MG/ML IV BOLUS
INTRAVENOUS | Status: AC
  Filled 2020-07-05: qty 20

## 2020-07-05 MED ORDER — POLYETHYLENE GLYCOL 3350 17 G PO PACK
17.0000 g | PACK | Freq: Two times a day (BID) | ORAL | Status: DC
  Administered 2020-07-05 – 2020-07-07 (×5): 17 g via ORAL
  Filled 2020-07-05 (×5): qty 1

## 2020-07-05 MED ORDER — LIDOCAINE 2% (20 MG/ML) 5 ML SYRINGE
INTRAMUSCULAR | Status: DC | PRN
  Administered 2020-07-05: 60 mg via INTRAVENOUS

## 2020-07-05 NOTE — Progress Notes (Signed)
OT Cancellation Note  Patient Details Name: Tony Marshall MRN: [id number] DOB: [birth year]   Cancelled Treatment:    Reason Eval/Treat Not Completed: Patient at procedure or test/ unavailable  Nahsir Venezia L Shealyn Sean 07/05/2020, 3:14 PM

## 2020-07-05 NOTE — Care Management Important Message (Signed)
Important Message  Patient Details IM Letter given to [name] RN Case Manager to present to the Patient Name: Tony Marshall MRN: [id number] Date of Birth: Apr 16, 1941   Medicare Important Message Given:  Yes     Kerin Salen 07/05/2020, 10:20 AM

## 2020-07-05 NOTE — Progress Notes (Addendum)
Progress Note  Patient Name: Tony Marshall Date of Encounter: 07/05/2020  Primary Cardiologist: Skeet Latch, MD  Subjective   Feeling good today. No specific complaints. Maintaining NSR   Inpatient Medications    Scheduled Meds: . sodium chloride   Intravenous Once  . sodium chloride   Intravenous Once  . amiodarone  200 mg Oral BID   Followed by  . [START ON 07/09/2020] amiodarone  200 mg Oral Daily  . carvedilol  12.5 mg Oral BID WC  . chlorhexidine  15 mL Mouth Rinse BID  . Chlorhexidine Gluconate Cloth  6 each Topical Daily  . divalproex  125 mg Oral Q12H  . feeding supplement (KATE FARMS STANDARD 1.4)  325 mL Oral BID BM  . hydrocortisone cream   Topical TID  . insulin aspart  0-5 Units Subcutaneous QHS  . insulin aspart  0-9 Units Subcutaneous TID WC  . insulin glargine  6 Units Subcutaneous QHS  . magnesium oxide  400 mg Oral Daily  . mouth rinse  15 mL Mouth Rinse q12n4p  . multivitamin with minerals  1 tablet Oral Daily  . polyethylene glycol  17 g Oral Daily  . rosuvastatin  5 mg Oral Daily  . sacubitril-valsartan  1 tablet Oral BID  . sodium chloride flush  10-40 mL Intracatheter Q12H   Continuous Infusions:  PRN Meds: acetaminophen **OR** acetaminophen, bisacodyl, liver oil-zinc oxide, sodium chloride flush, traMADol   Vital Signs    Vitals:   07/04/20 2343 07/05/20 0000 07/05/20 0411 07/05/20 0721  BP:  (!) 152/94 (!) 165/87 (!) 164/73  Pulse:  69 (!) 58 62  Resp:  16 16 12   Temp:  98.8 F (37.1 C) 98.1 F (36.7 C) 97.9 F (36.6 C)  TempSrc:  Oral Oral Oral  SpO2: 99% 96% 99%   Weight:      Height:        Intake/Output Summary (Last 24 hours) at 07/05/2020 0738 Last data filed at 07/04/2020 2048 Gross per 24 hour  Intake 15.06 ml  Output 1000 ml  Net -984.94 ml   Filed Weights   06/28/20 0635 06/28/20 2100 07/04/20 0507  Weight: 68 kg 57.3 kg 61.7 kg    Physical Exam   General: Frail, NAD Neck: Negative for carotid  bruits. No JVD Lungs:Clear to ausculation bilaterally. Breathing is unlabored. Cardiovascular: RRR with S1 S2. No murmurs Abdomen: Soft, non-tender, non-distended. No obvious abdominal masses. Extremities: No edema. Radial pulses 2+ bilaterally Neuro: Alert and oriented. No focal deficits. No facial asymmetry. MAE spontaneously. Psych: Responds to questions appropriately with normal affect.    Labs    Chemistry Recent Labs  Lab 06/29/20 0247 06/30/20 0457 07/03/20 0500 07/04/20 0707 07/05/20 0428  NA 136   < > 136 133* 131*  K 3.2*   < > 3.2* 3.2* 3.5  CL 102   < > 100 96* 95*  CO2 23   < > 29 28 28   GLUCOSE 74   < > 159* 173* 182*  BUN 10   < > 8 11 12   CREATININE 0.47*   < > 0.46* 0.48* 0.46*  CALCIUM 8.0*   < > 7.2* 7.8* 7.9*  PROT 5.0*  --  3.5*  --   --   ALBUMIN 2.6*  --  1.7*  --   --   AST 17  --  17  --   --   ALT 10  --  9  --   --  ALKPHOS 73  --  64  --   --   BILITOT 1.5*  --  0.6  --   --   GFRNONAA >60   < > >60 >60 >60  GFRAA >60   < > >60 >60 >60  ANIONGAP 11   < > 7 9 8    < > = values in this interval not displayed.     Hematology Recent Labs  Lab 07/04/20 2002 07/05/20 0007 07/05/20 0428  WBC 2.8* 3.8* 2.4*  RBC 3.51* 3.34* 3.18*  HGB 10.5* 10.0* 9.6*  HCT 32.7* 30.7* 29.5*  MCV 93.2 91.9 92.8  MCH 29.9 29.9 30.2  MCHC 32.1 32.6 32.5  RDW 18.2* 18.0* 17.9*  PLT 118* 126* 109*    Cardiac EnzymesNo results for input(s): TROPONINI in the last 168 hours. No results for input(s): TROPIPOC in the last 168 hours.   BNPNo results for input(s): BNP, PROBNP in the last 168 hours.   DDimer No results for input(s): DDIMER in the last 168 hours.   Radiology    DG Abd Portable 1V  Result Date: 07/03/2020 CLINICAL DATA:  79 y.o. male with constipation. Medical history significant of CLL, DM, HTN, HLD, combined systolic/diastolic CHF EXAM: PORTABLE ABDOMEN - 1 VIEW COMPARISON:  Abdominal radiograph 07/01/2020 FINDINGS: The far left hemiabdomen is  excluded from field of view. The bowel gas pattern is nonobstructive. Moderate stool burden. No supine evidence for free air. No unexpected radiopaque calcification. No acute finding in the visualized skeleton. IMPRESSION: Nonobstructive bowel gas pattern. Moderate stool burden. Electronically Signed   By: Audie Pinto M.D.   On: 07/03/2020 08:34   VAS [place] UPPER EXTREMITY VENOUS DUPLEX  Result Date: 07/04/2020 UPPER VENOUS STUDY  Indications: Edema Risk Factors: None identified. Comparison Study: No prior studies. Performing Technologist: Oliver Hum RVT  Examination Guidelines: A complete evaluation includes B-mode imaging, spectral Doppler, color Doppler, and power Doppler as needed of all accessible portions of each vessel. Bilateral testing is considered an integral part of a complete examination. Limited examinations for reoccurring indications may be performed as noted.  Right Findings: +----------+------------+---------+-----------+----------+-----------------+ RIGHT     CompressiblePhasicitySpontaneousProperties     Summary      +----------+------------+---------+-----------+----------+-----------------+ IJV           Full       Yes       Yes                                +----------+------------+---------+-----------+----------+-----------------+ Subclavian  Partial      No        No               Age Indeterminate +----------+------------+---------+-----------+----------+-----------------+ Axillary      Full       Yes       Yes                                +----------+------------+---------+-----------+----------+-----------------+ Brachial      Full       Yes       Yes                                +----------+------------+---------+-----------+----------+-----------------+ Radial        Full                                                    +----------+------------+---------+-----------+----------+-----------------+  Ulnar         Full                                                     +----------+------------+---------+-----------+----------+-----------------+ Cephalic      Full                                                    +----------+------------+---------+-----------+----------+-----------------+ Basilic       Full                                                    +----------+------------+---------+-----------+----------+-----------------+  Left Findings: +----------+------------+---------+-----------+----------+-------+ LEFT      CompressiblePhasicitySpontaneousPropertiesSummary +----------+------------+---------+-----------+----------+-------+ Subclavian    Full       Yes       Yes                      +----------+------------+---------+-----------+----------+-------+  Summary:  Right: No evidence of superficial vein thrombosis in the upper extremity. 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  Left: No evidence of thrombosis in the subclavian.  *See table(s) above for measurements and observations.  Diagnosing physician: Servando Snare MD Electronically signed by Servando Snare MD on 07/04/2020 at 5:29:09 PM.    Final    Telemetry    07/05/20 NSR HR in the 60's - Personally Reviewed  ECG    No new tracing as of 07/05/20- Personally Reviewed  Cardiac Studies   Echo 06/07/20 1. Left ventricular ejection fraction, by estimation, is 25 to 30%. The  left ventricle has severely decreased function. The left ventricle has no  regional wall motion abnormalities. Left ventricular diastolic parameters  are consistent with Grade II  diastolic dysfunction (pseudonormalization).  2. Right ventricular systolic function is normal. The right ventricular  size is normal. There is normal pulmonary artery systolic pressure.  3. The mitral valve is normal in structure. No evidence of mitral valve  regurgitation. No evidence of mitral stenosis.  4. The aortic  valve is grossly normal. Aortic valve regurgitation is not  visualized. No aortic stenosis is present.  5. The inferior vena cava is normal in size with greater than 50%  respiratory variability, suggesting right atrial pressure of 3 mmHg.  Patient Profile     79 y.o. male with history ofCLL, DM, OSA, HTN, HLD,prior habitual alcohol use,GERD, Schatzki's ring, prolonged QTc, recently diagnosed cardiomyopathy in the context of acute illness. Tumultuous course recently related to CLL, initiation of chemotherapy, dehydration, weight loss, constipation and subsequent diarrhea after laxatives. He wasrecentlyadmitted6/21/2021with confusion, weakness, and fatigue, felt to have septic shock in setting of RLL cellulitis.He was noted to be hyponatremic, hypokalemic, hypomagnesemic, febrile, in metabolic acidosis with anion gap and lactic acidosis, with AKI, decreased TSH, toxic metabolic encephalopathy,prolonged QT,and chemo-induced neutropenia. Co-ox was relatively normal, arguing against cardiogenic etiology of shock.2D echo was obtained which showed severe LV dysfunction with EF 25-30%, grade 2 DD, normal RV, therefore cardiology consulted- plan to re-eval as outpatient for [facility] when more medically  stable. He was readmitted7/14/2021with diarrhea and maroon colored stools with clots, found to have GIB and DKA. Felt to have possiblefecal impaction with stercoral ulcer/hemorrhoids, being treated by GI. Hospital course complicated byextreme agitation/delirium where patient was combative/hitting staff members.Cardiology following for new onset atrial fibrillation with RVR.  Assessment & Plan    1. Atrial fibrillation with RVR: -Newly dx this admission in the setting of DKA, hypokalemia (K 2.9) and GI bleed. -Placed on IV amiodarone with conversion to NSR>>now transitioned to oral amiodarone 07/03/20 with plan for 200mg  BID through 7/24, then begin 200mg  daily on 07/09/20 -CHADSVASC 6 for CHF, HTN,  age x2, DM, vascular disease (coronary calcification on CT).  -No AC secondary to GIB -Maintaining NSR, HR 60's>>may be reasonable to hold off anticoagulation discussions moving forward pending outpatient reassessment in case of isolated atrial fib   2. Acute GI bleed with recent treatment for CLL: -Hb stable today at 9.5>>stable from yesterday  -Management per IM   3. Recently abnormal thyroid function: -TSH 3.521 on 07/03/20 -Management per primary team   4. Chronic combined CHF/cardiomyopathy: -Dx last admission as above>>unclear etiology -Plan was for [facility] when medically stable>>however now readmitted with the above issues -Weight, 136lb yesterday  -I&O, net positive 6.7L -Continue carvedilol -Appears euvolemic on exam today  -Entresto restarted 7/18>>>uptitrated yesterday  -Would consider further up-titration of Entresto given elevated BP, stable creatinine and stable electrolytes>>>defer until MD evaluates    5. Essential HTN: -Elevated, 164/73>165/87>152/94 -Initially hypotensive, then regained BP with clinical improvement. Managed in context of above  6. Prolonged QTc: -Noted to be a problem last admission as well.  -QTC per telemetry today at 521ms -Avoid QT prolonging agents>>seems stable on amiodarone, but would avoid outpatient meds that have this potential (I.e. loperamide, tramadol)  7. Electrolyte abnormalities: -Stabilized today, K+ 3.5, Mg+ 1.8 -Ca+ remains low at 7.9 today     Signed, Kathyrn Drown NP-C [facility] Pager: [phone] 07/05/2020, 7:38 AM     For questions or updates, please contact   Please consult [email or web address] for contact info under Cardiology/STEMI.  Patient was at endoscopy when I went to go see him today. VS reviewed and notes. Entresto increased dose seems to be tolerated well. Will maintain this dose at this time.   Elouise Munroe, MD

## 2020-07-05 NOTE — Op Note (Signed)
[facility] Patient Name: Tony Marshall Procedure Date: 07/05/2020 MRN: [id number] Attending MD: Thornton Park MD, MD Date of Birth: 11-Sep-1941 CSN: [id number] Age: 79 Admit Type: Inpatient Procedure:                Flexible Sigmoidoscopy Indications:              Recurrent rectal hemorrhage due to suspected                            stercoral ulcer or diverticulosis Providers:                Thornton Park MD, MD, Jeanella Cara, RN,                            Laverda Sorenson, Technician, Tyrone Apple,                            Technician, Adair Laundry, CRNA Referring MD:              Medicines:                Monitored Anesthesia Care Complications:            No immediate complications. Estimated Blood Loss:     Estimated blood loss: none. Procedure:                Pre-Anesthesia Assessment:                           - Prior to the procedure, a History and Physical                            was performed, and patient medications and                            allergies were reviewed. The patient's tolerance of                            previous anesthesia was also reviewed. The risks                            and benefits of the procedure and the sedation                            options and risks were discussed with the patient.                            All questions were answered, and informed consent                            was obtained. Prior Anticoagulants: The patient has                            taken no previous anticoagulant or antiplatelet                            agents. ASA Grade  Assessment: III - A patient with                            severe systemic disease. After reviewing the risks                            and benefits, the patient was deemed in                            satisfactory condition to undergo the procedure.                           After obtaining informed consent, the scope was                             passed under direct vision. The PCF-H190DL                            ([id number]) Olympus pediatric colonscope was                            introduced through the anus and advanced to the the                            rectosigmoid junction. The flexible sigmoidoscopy                            was accomplished without difficulty. The patient                            tolerated the procedure well. The quality of the                            bowel preparation was inadequate. Scope In: 1:43:49 PM Scope Out: 1:48:51 PM Total Procedure Duration: 0 hours 5 minutes 2 seconds  Findings:      Non-bleeding external hhemorrhoids were found on perianal exam.      A decubitus ulcer was also identified on perianal exam.      A single (solitary) ulcer was found in the rectum. No active bleeding       was present.      A large amount of solid stool was found in the rectum consistent with       fecal impaction, precluding any meaningful visualization. I manually       disimpacted the large rectal stool ball. Too much stool remained for any       additional meaningful evaluation of the colonic mucosa. Impression:               - Preparation of the colon was inadequate due to                            fecal impaction. Manual disimpaction performed                            during the procedure.                           -  Non-bleeding external hemorrhoids found on                            perianal exam.                           - Decubitus ulcer identified on perianal exam.                           - A single (solitary) ulcer in the rectum                            consistent with stercoral ulcer - the source of                            recent bleeding. Not actively bleeding.                           - No specimens collected. Moderate Sedation:      Not Applicable - Patient had care per Anesthesia. Recommendation:           - Return patient to hospital ward for ongoing care.                            - Advance diet as tolerated.                           - Aggressive bowel regimen to prevent recurrence of                            fecal impaction including Miralax 17 g BID,                            psyllium daily, and drinking at least 64 ounces of                            water daily.                           - Consider Carafate enemas 2 gm in 20 ml water BID                            x 6 weeks.                           - Avoid anticoagulation as able as this ulcer is at                            high risk for rebleeding.                           - I alerted the hospitalist team to the finding of                            the decubitus ulcer.  I discussed these findings and recommendations with                            the patient in the endoscopy recovery area and with                            his wife by telephone.                           Please call the on-call gastroenterologist with any                            additional questions or concerns during this                            hospitalization. Procedure Code(s):        --- Professional ---                           [phone], 52, Sigmoidoscopy, flexible; diagnostic,                            including collection of specimen(s) by brushing or                            washing, when performed (separate procedure) Diagnosis Code(s):        --- Professional ---                           K64.9, Unspecified hemorrhoids                           K62.6, Ulcer of anus and rectum                           K62.5, Hemorrhage of anus and rectum CPT copyright 2019 [employer]. All rights reserved. The codes documented in this report are preliminary and upon coder review may  be revised to meet current compliance requirements. Thornton Park MD, MD 07/05/2020 2:13:05 PM This report has been signed electronically. Number of Addenda: 0

## 2020-07-05 NOTE — H&P (View-Only) (Signed)
Patient ID: Tony Marshall, male   DOB: 1941/09/21, 79 y.o.   MRN: [id number]    Progress Note   Subjective   Day # 7 Asked to reevaluate this patient who had been followed by GI earlier this admission with fecal impaction/overflow diarrhea and some rectal bleeding which was presumed to be secondary to associated stercoral ulcer.  He did not undergo endoscopic evaluation as he did not have any significant bleeding or drop in hemoglobin. Last colonoscopy was done in August 2017, which showed multiple left colon diverticuli and internal and external hemorrhoids.  Patient was diagnosed with a right subclavian DVT yesterday, and anticoagulation was initiated with heparin. Within 2 hours of starting heparin patient had recurrent rectal bleeding, with passage of maroon stool. Heparin was stopped  Hemoglobin 4 days ago 9.4, yesterday a.m. hemoglobin 10.4 last p.m. hemoglobin 10.5- 9.5 this a.m.  Patient denies any abdominal or rectal pain. Says he feels okay this morning, does not seem to remember bleeding last night. His wife who is at bedside said he had two episodes last evening, passed a little bit of blood a few hours later, no active bleeding today. It sounds as if he is still had some oozing of stool. They both report that he has not been eating or drinking much of anything, absolutely no appetite.        Objective   Vital signs in last 24 hours: Temp:  [97.9 F (36.6 C)-99.6 F (37.6 C)] 97.9 F (36.6 C) (07/21 0721) Pulse Rate:  [58-69] 62 (07/21 0721) Resp:  [12-20] 12 (07/21 0721) BP: (145-165)/(72-94) 164/73 (07/21 0721) SpO2:  [96 %-100 %] 99 % (07/21 0411) Last BM Date: 07/04/20 General:    white male in NAD Heart:  Regular rate and rhythm; no murmurs Lungs: Respirations even and unlabored, lungs CTA bilaterally Abdomen:  Soft, nontender and nondistended. Normal bowel sounds. Extremities:  Without edema. Neurologic:  Alert and oriented,  grossly normal  neurologically. Psych:  Cooperative. Normal mood and affect.  Intake/Output from previous day: 07/20 0701 - 07/21 0700 In: 15.1 [I.V.:15.1] Out: 1000 [Urine:1000] Intake/Output this shift: Total I/O In: -  Out: 1000 [Urine:1000]  Lab Results: Recent Labs    07/05/20 0007 07/05/20 0428 07/05/20 0747  WBC 3.8* 2.4* 2.9*  HGB 10.0* 9.6* 9.5*  HCT 30.7* 29.5* 29.0*  PLT 126* 109* 118*   BMET Recent Labs    07/03/20 0500 07/04/20 0707 07/05/20 0428  NA 136 133* 131*  K 3.2* 3.2* 3.5  CL 100 96* 95*  CO2 29 28 28   GLUCOSE 159* 173* 182*  BUN 8 11 12   CREATININE 0.46* 0.48* 0.46*  CALCIUM 7.2* 7.8* 7.9*   LFT Recent Labs    07/03/20 0500  PROT 3.5*  ALBUMIN 1.7*  AST 17  ALT 9  ALKPHOS 64  BILITOT 0.6   PT/INR No results for input(s): LABPROT, INR in the last 72 hours.  Studies/Results: VAS [place] UPPER EXTREMITY VENOUS DUPLEX  Result Date: 07/04/2020 UPPER VENOUS STUDY  Indications: Edema Risk Factors: None identified. Comparison Study: No prior studies. Performing Technologist: Oliver Hum RVT  Examination Guidelines: A complete evaluation includes B-mode imaging, spectral Doppler, color Doppler, and power Doppler as needed of all accessible portions of each vessel. Bilateral testing is considered an integral part of a complete examination. Limited examinations for reoccurring indications may be performed as noted.  Right Findings: +----------+------------+---------+-----------+----------+-----------------+ RIGHT     CompressiblePhasicitySpontaneousProperties     Summary      +----------+------------+---------+-----------+----------+-----------------+  IJV           Full       Yes       Yes                                +----------+------------+---------+-----------+----------+-----------------+ Subclavian  Partial      No        No               Age Indeterminate +----------+------------+---------+-----------+----------+-----------------+  Axillary      Full       Yes       Yes                                +----------+------------+---------+-----------+----------+-----------------+ Brachial      Full       Yes       Yes                                +----------+------------+---------+-----------+----------+-----------------+ Radial        Full                                                    +----------+------------+---------+-----------+----------+-----------------+ Ulnar         Full                                                    +----------+------------+---------+-----------+----------+-----------------+ Cephalic      Full                                                    +----------+------------+---------+-----------+----------+-----------------+ Basilic       Full                                                    +----------+------------+---------+-----------+----------+-----------------+  Left Findings: +----------+------------+---------+-----------+----------+-------+ LEFT      CompressiblePhasicitySpontaneousPropertiesSummary +----------+------------+---------+-----------+----------+-------+ Subclavian    Full       Yes       Yes                      +----------+------------+---------+-----------+----------+-------+  Summary:  Right: No evidence of superficial vein thrombosis in the upper extremity. 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  Left: No evidence of thrombosis in the subclavian.  *See table(s) above for measurements and observations.  Diagnosing physician: Servando Snare MD Electronically signed by Servando Snare MD on 07/04/2020 at 5:29:09 PM.    Final        Assessment / Plan:     #29 79 year old white male with multiple comorbidities, who was admitted 7 days ago with complaints of rectal bleeding. This is in the setting of CLL for which  she has been undergoing chemotherapy, adult onset diabetes  mellitus, hypertension and congestive heart failure with a EF 20 to 25%. Patient also diagnosed with atrial fibrillation since admission and started on amiodarone. Patient was evaluated by GI earlier this admission fecal impaction overflow type diarrhea and rectal bleeding felt likely secondary to a stercoral ulcer. He did not manifest any hemodynamically significant bleeding and decision was made not to proceed with endoscopic evaluation. He underwent disimpaction etc. Patient diagnosed with a right subclavian DVT yesterday and heparin was started.  Patient developed recurrent hematochezia with maroon stool x2 last night. Heparin has been discontinued. He has been hemodynamically stable overnight without further active hemorrhage. Hemoglobin has drifted about 1 g, but he was also noted to have a pancytopenia at this time.  Etiology of his bleeding still likely secondary to stercoral ulceration versus diverticular. He did have a colonoscopy in 2017 with finding of significant diverticulosis and internal and external hemorrhoids. Because of the need for anticoagulation at this time endoscopic evaluation is indicated  Plan; keep n.p.o. this a.m. Serial hemoglobins every 8 hours and transfuse for hemoglobin less than eight Patient has been scheduled for flexible sigmoidoscopy with Dr. Tarri Glenn this afternoon. We will give two tapwater enemas this morning. Procedure was discussed in detail with patient and his wife including indications risks and benefits and they are agreeable to proceed. Further plans pending findings at sigmoidoscopy. Hold anticoagulation until after sigmoidoscopy today.          Active Problems:   Essential hypertension   Pure hypercholesterolemia   CLL (chronic lymphocytic leukemia) (HCC)   Increased anion gap metabolic acidosis   Symptomatic anemia   Hyperglycemia due to diabetes mellitus (HCC)   DKA (diabetic ketoacidoses) (HCC)   Systolic and diastolic CHF,  chronic (HCC)   Fecal impaction (HCC)   Overflow diarrhea   Rectal bleeding   Lower GI bleed   Central venous catheter in place   Shock circulatory ([place])   Prolonged QT interval   Atrial fibrillation ([place])     LOS: 7 days   Capers Hagmann PA-C 07/05/2020, 8:42 AM

## 2020-07-05 NOTE — Progress Notes (Signed)
PT Cancellation Note  Patient Details Name: Tony Marshall MRN: [id number] DOB: 13-May-1941   Cancelled Treatment:    Reason Eval/Treat Not Completed: Patient at procedure or test/unavailable   Antia Rahal,KATHrine E 07/05/2020, 1:01 PM Arlyce Dice, DPT Acute Rehabilitation Services Pager: [phone] Office: [phone]

## 2020-07-05 NOTE — Anesthesia Postprocedure Evaluation (Signed)
Anesthesia Post Note  Patient: Tony Marshall  Procedure(s) Performed: FLEXIBLE SIGMOIDOSCOPY (N/A )     Patient location during evaluation: Endoscopy Anesthesia Type: MAC Level of consciousness: awake and alert and awake Pain management: pain level controlled Vital Signs Assessment: post-procedure vital signs reviewed and stable Respiratory status: spontaneous breathing, nonlabored ventilation, respiratory function stable and patient connected to nasal cannula oxygen Cardiovascular status: stable and blood pressure returned to baseline Postop Assessment: no apparent nausea or vomiting Anesthetic complications: no   No complications documented.  Last Vitals:  Vitals:   07/05/20 1420 07/05/20 1441  BP: (!) 160/61 132/90  Pulse: 66 64  Resp: 19 15  Temp:  36.6 C  SpO2: 96% 98%    Last Pain:  Vitals:   07/05/20 1441  TempSrc: Oral  PainSc:                  Catalina Gravel

## 2020-07-05 NOTE — Anesthesia Preprocedure Evaluation (Addendum)
Anesthesia Evaluation  Patient identified by MRN, date of birth, ID band Patient awake    Reviewed: Allergy & Precautions, NPO status , Patient's Chart, lab work & pertinent test results, reviewed documented beta blocker date and time   Airway Mallampati: III  TM Distance: >3 FB Neck ROM: Full    Dental  (+) Teeth Intact, Dental Advisory Given   Pulmonary sleep apnea ,    Pulmonary exam normal breath sounds clear to auscultation       Cardiovascular hypertension, Pt. on home beta blockers +CHF and + DVT  + dysrhythmias Atrial Fibrillation  Rhythm:Regular Rate:Normal     Neuro/Psych negative neurological ROS  negative psych ROS   GI/Hepatic Neg liver ROS, hiatal hernia, GERD  Medicated,  Endo/Other  diabetes, Type 2, Insulin Dependent, Oral Hypoglycemic Agents  Renal/GU negative Renal ROS     Musculoskeletal negative musculoskeletal ROS (+)   Abdominal   Peds  Hematology  (+) Blood dyscrasia (Thrombocytopenia), anemia , CLL   Anesthesia Other Findings Day of surgery medications reviewed with the patient.  Reproductive/Obstetrics                           Anesthesia Physical Anesthesia Plan  ASA: III  Anesthesia Plan: MAC   Post-op Pain Management:    Induction: Intravenous  PONV Risk Score and Plan: 1 and Propofol infusion and Treatment may vary due to age or medical condition  Airway Management Planned: Natural Airway and Nasal Cannula  Additional Equipment:   Intra-op Plan:   Post-operative Plan:   Informed Consent: I have reviewed the patients History and Physical, chart, labs and discussed the procedure including the risks, benefits and alternatives for the proposed anesthesia with the patient or authorized representative who has indicated his/her understanding and acceptance.     Dental advisory given  Plan Discussed with: CRNA  Anesthesia Plan Comments:          Anesthesia Quick Evaluation

## 2020-07-05 NOTE — Interval H&P Note (Signed)
History and Physical Interval Note:  07/05/2020 1:21 PM  Tony Marshall  has presented today for surgery, with the diagnosis of rectal bleeding.  The various methods of treatment have been discussed with the patient and family. After consideration of risks, benefits and other options for treatment, the patient has consented to  Procedure(s): FLEXIBLE SIGMOIDOSCOPY (N/A) as a surgical intervention.  The patient's history has been reviewed, patient examined, no change in status, stable for surgery.  I have reviewed the patient's chart and labs.  Questions were answered to the patient's satisfaction.     Thornton Park

## 2020-07-05 NOTE — Transfer of Care (Signed)
Immediate Anesthesia Transfer of Care Note  Patient: Tony Marshall  Procedure(s) Performed: FLEXIBLE SIGMOIDOSCOPY (N/A )  Patient Location: Endoscopy Unit  Anesthesia Type:MAC  Level of Consciousness: drowsy and patient cooperative  Airway & Oxygen Therapy: Patient Spontanous Breathing and Patient connected to face mask oxygen  Post-op Assessment: Report given to RN and Post -op Vital signs reviewed and stable  Post vital signs: Reviewed and stable  Last Vitals:  Vitals Value Taken Time  BP    Temp    Pulse    Resp    SpO2      Last Pain:  Vitals:   07/05/20 1302  TempSrc: Oral  PainSc: 0-No pain      Patients Stated Pain Goal: 2 (45/80/99 8338)  Complications: No complications documented.

## 2020-07-05 NOTE — Progress Notes (Signed)
PROGRESS NOTE  Tony Marshall  Tony:[id number] DOB: 1941/07/31 DOA: 06/28/2020 PCP: Elayne Snare, MD   Brief Narrative: KYCEN Marshall is a 79 y.o. male with a history of CLL, chronic combined CHF (Dx June 2021), T2DM, HTN, HLD, diverticulosis who presented to the ED 7/14 with hematochezia found to be in DKA and admitted to ICU on insulin infusion with resolution of DKA. Atrial fibrillation with RVR developed for which cardiology was consulted, started amiodarone IV. The hematochezia was thought to be due to fecal impaction with stercoral ulcer and resolved with conservative treatment including transfusions. Bowel regimen was started. Due to arm swelling, U/S was performed demonstrating an age-indeterminate subclavian DVT. Heparin was started without bolus with subsequent recurrence of GI bleeding. Heparin was stopped and GI performed flexible sigmoidoscopy limited by significant stool burden/fecal impaction. Incomplete evacuation was performed 7/21. Magnesium citrate is added and bowel program has been augmented.   He also had protracted hospitalization involving septic shock possibly due to RLL cellulitis initially requiring pressors, improved on ancef.  Assessment & Plan: Active Problems:   Essential hypertension   Pure hypercholesterolemia   CLL (chronic lymphocytic leukemia) (HCC)   Increased anion gap metabolic acidosis   Symptomatic anemia   Hyperglycemia due to diabetes mellitus (HCC)   DKA (diabetic ketoacidoses) (HCC)   Systolic and diastolic CHF, chronic (HCC)   Fecal impaction (HCC)   Overflow diarrhea   Rectal bleeding   Lower GI bleed   Central venous catheter in place   Shock circulatory ([place])   Prolonged QT interval   Atrial fibrillation (HCC)   Stercoral ulcer of rectum  Acute blood loss anemia due to GI bleeding due to stercoral ulcer due to fecal impaction:  - s/p 2u PRBCs 7/17. Will continue CBC serially, change to q12h given no brisk bleeding. - Hold heparin.  GI believes continued risk of hemorrhage is very high while ulcer is present.  - Treat fecal impaction. Has had manual disimpaction at time of sigmoidoscopy. Will give magnesium citrate and augment miralax to BID, add psyllium, though this must be taken with sufficient water. If unsuccessful, could add lactulose or similar. Note previous diarrhea was due to overflow. - Plan to continue laxative/stool softener lifelong to prevent recurrence and worsening of descending/sigmoid diverticulosis, hemorrhoids.   Cough: No abnormalities on exam, no hypoxia, no hemoptysis. No fever.  - Send sputum for culture, if cough worsens consider CXR.  - Strongly suspect atelectasis. Incentive spirometry encouraged, ordered.   Age-indeterminate right subclavian DVT: Likely related to PICC that was in place during previous hospitalization (6/21 - 7/6).  - Unable to anticoagulate at this time. Discussed risks and benefits w/family.   DKA in T2DM: Resolved s/p IV insulin. HbA1c 7.7% last month. DM followed by Dr. Dwyane Dee, Dx 1992. - Continue lantus 6u + SSI. At inpatient goal - Diabetes coordinator consulted   New onset atrial fibrillation with RVR:  - Continue telemetry, keep K, Mg replete. TSH 3.521.  - Continue amiodarone with planned taper 200mg  po BID > 200mg  qd on 7/25. - Continue coreg. Avoid CCB's w/LV systolic dysfunction. - [id number] score is 6 (CHF, HTN, age x2, T2DM, vascular calcifications by imaging), though holding anticoagulation in setting of GI bleed as above.    Chronic combined HFrEF: Roughly euvolemic on exam, though I/O shows net positive. Echo 06/07/2020 w/LVEF 25-30%, G2DD, RV appears normal in structure and function.  - Continue entresto (newly started), titrate dose up per cardiology. Cr, K, BP stable.  - No current  need for diuretic.  - RHC/LHC was planned once medically stabilized following recent hospitalization for septic shock.  CLL: Dx Oct 2014, Tx initiated 04/04/2020 with  bendamustine/rituximab now s/p 3 cycles (last 6/15) and plan seems to be for observation. Tolerates chemo poorly w/fatigue, weakness, weight loss, poor appetite, etc.  - Pancytopenia possibly related to chemotherapy, appears stable.  OSA:  - CPAP qHS encouraged  Diffuse atherosclerosis of coronary arteries noted on imaging: No chest pain.  - On beta blocker, statin. No aspirin or anticoagulation at this time.   Prolonged QT interval: Stable, chronic.  - Minimize provocative agents.   Acute metabolic encephalopathy: Improved.  - Continue depakote and delirium precautions. Family at bedside appears to be helpful.   HTN:  - Cardiac medications as above  HLD:  - Continue statin  Hypokalemia: Improved.   DVT prophylaxis: SCDs Code Status: Full Family Communication: Wife and daughter at bedside Disposition Plan:  Status is: Inpatient  Remains inpatient appropriate because:Inpatient level of care appropriate due to severity of illness   Dispo: The patient is from: Home              Anticipated d/c is to: TBD, PT/OT assessment pending. Note prior recommendation was to SNF, though all facilities offering beds were declined by patient/wife.              Anticipated d/c date is: 2 days              Patient currently is not medically stable to d/c.  Consultants:   GI  Cardiology  Procedures:   Flexible sigmoidoscopy 07/05/2020 Dr. Tarri Glenn:  Findings:      Non-bleeding external hhemorrhoids were found on perianal exam.      A decubitus ulcer was also identified on perianal exam.      A single (solitary) ulcer was found in the rectum. No active bleeding       was present.      A large amount of solid stool was found in the rectum consistent with       fecal impaction, precluding any meaningful visualization. I manually       disimpacted the large rectal stool ball. Too much stool remained for any       additional meaningful evaluation of the colonic mucosa. Impression:       -  Preparation of the colon was inadequate due to                            fecal impaction. Manual disimpaction performed                            during the procedure.                           - Non-bleeding external hemorrhoids found on                            perianal exam.                           - Decubitus ulcer identified on perianal exam.                           - A single (solitary) ulcer in the  rectum                            consistent with stercoral ulcer - the source of                            recent bleeding. Not actively bleeding.                           - No specimens collected. Moderate Sedation:      Not Applicable - Patient had care per Anesthesia. Recommendation:           - Return patient to hospital ward for ongoing care.                           - Advance diet as tolerated.                           - Aggressive bowel regimen to prevent recurrence of                            fecal impaction including Miralax 17 g BID,                            psyllium daily, and drinking at least 64 ounces of                            water daily.                           - Consider Carafate enemas 2 gm in 20 ml water BID                            x 6 weeks.                           - Avoid anticoagulation as able as this ulcer is at                            high risk for rebleeding.                           - I alerted the hospitalist team to the finding of                            the decubitus ulcer.                           I discussed these findings and recommendations with                            the patient in the endoscopy recovery area and with                            his wife by telephone.  Please call the on-call gastroenterologist with any                            additional questions or concerns during this                            hospitalization.  Antimicrobials:  None   Subjective: Calm, denies  pain. Some smearing stools without gross/brisk bleeding. No chest pain or dyspnea. No orthopnea. Developing a cough occasionally productive of clear/yellow sputum, no hemoptysis. No chest pain or dyspnea.   Objective: Vitals:   07/05/20 1400 07/05/20 1410 07/05/20 1420 07/05/20 1441  BP: 131/67 (!) 177/96 (!) 160/61 132/90  Pulse: 64 66 66 64  Resp: 16 14 19 15   Temp: (!) 97 F (36.1 C)   97.9 F (36.6 C)  TempSrc: Axillary   Oral  SpO2: 100% 100% 96% 98%  Weight:      Height:        Intake/Output Summary (Last 24 hours) at 07/05/2020 1543 Last data filed at 07/05/2020 1347 Gross per 24 hour  Intake 215.06 ml  Output 2200 ml  Net -1984.94 ml   Filed Weights   06/28/20 2100 07/04/20 0507 07/05/20 1302  Weight: 57.3 kg 61.7 kg 61.7 kg    Gen: 79 y.o. male in no distress Pulm: Non-labored breathing room air. Clear to auscultation bilaterally.  CV: Regular rate and rhythm with occasional premature beat. No murmur, rub, or gallop. No JVD, no pitting pedal edema. GI: Abdomen soft, non-tender, non-distended, with normoactive bowel sounds. No organomegaly or masses felt. Ext: Warm, no deformities Skin: No decubitus ulcer noted. There is shallow ulceration perirectally without exudate or extending erythema.  Neuro: Alert and oriented. No focal neurological deficits. Psych: Judgement and insight appear normal. Mood & affect appropriate.   Data Reviewed: I have personally reviewed following labs and imaging studies  CBC: Recent Labs  Lab 07/03/20 0500 07/04/20 0707 07/04/20 2002 07/05/20 0007 07/05/20 0428 07/05/20 0747 07/05/20 1124  WBC 2.4*   < > 2.8* 3.8* 2.4* 2.9* 2.4*  NEUTROABS 1.6*  --   --   --   --   --   --   HGB 9.3*   < > 10.5* 10.0* 9.6* 9.5* 9.6*  HCT 28.8*   < > 32.7* 30.7* 29.5* 29.0* 29.0*  MCV 92.6   < > 93.2 91.9 92.8 92.4 92.1  PLT PLATELET CLUMPS NOTED ON SMEAR, UNABLE TO ESTIMATE   < > 118* 126* 109* 118* 110*   < > = values in this interval not  displayed.   Basic Metabolic Panel: Recent Labs  Lab 07/01/20 0500 07/01/20 0500 07/01/20 1920 07/02/20 0430 07/03/20 0500 07/04/20 0707 07/05/20 0428  NA 132*   < > 133* 135 136 133* 131*  K 3.1*   < > 3.0* 3.6 3.2* 3.2* 3.5  CL 101   < > 103 101 100 96* 95*  CO2 25   < > 25 25 29 28 28   GLUCOSE 93   < > 196* 212* 159* 173* 182*  BUN 7*   < > 9 9 8 11 12   CREATININE 0.46*   < > 0.60* 0.49* 0.46* 0.48* 0.46*  CALCIUM 7.7*   < > 7.5* 7.7* 7.2* 7.8* 7.9*  MG 1.6*  --  1.5* 1.7  --  1.7 1.7   < > = values in this interval not displayed.  GFR: Estimated Creatinine Clearance: 68.6 mL/min (A) (by C-G formula based on SCr of 0.46 mg/dL (L)). Liver Function Tests: Recent Labs  Lab 06/29/20 0247 07/03/20 0500  AST 17 17  ALT 10 9  ALKPHOS 73 64  BILITOT 1.5* 0.6  PROT 5.0* 3.5*  ALBUMIN 2.6* 1.7*   No results for input(s): LIPASE, AMYLASE in the last 168 hours. No results for input(s): AMMONIA in the last 168 hours. Coagulation Profile: No results for input(s): INR, PROTIME in the last 168 hours. Cardiac Enzymes: No results for input(s): CKTOTAL, CKMB, CKMBINDEX, TROPONINI in the last 168 hours. BNP (last 3 results) No results for input(s): PROBNP in the last 8760 hours. HbA1C: No results for input(s): HGBA1C in the last 72 hours. CBG: Recent Labs  Lab 07/04/20 1213 07/04/20 1619 07/04/20 2145 07/05/20 0802 07/05/20 1139  GLUCAP 206* 237* 207* 154* 156*   Lipid Profile: No results for input(s): CHOL, HDL, LDLCALC, TRIG, CHOLHDL, LDLDIRECT in the last 72 hours. Thyroid Function Tests: Recent Labs    07/03/20 1100  TSH 3.521  FREET4 1.27*   Anemia Panel: No results for input(s): VITAMINB12, FOLATE, FERRITIN, TIBC, IRON, RETICCTPCT in the last 72 hours. Urine analysis:    Component Value Date/Time   COLORURINE YELLOW 06/28/2020 0758   APPEARANCEUR CLEAR 06/28/2020 0758   LABSPEC 1.018 06/28/2020 0758   PHURINE 5.0 06/28/2020 0758   GLUCOSEU >=500 (A)  06/28/2020 0758   GLUCOSEU 500 (A) 06/27/2020 1354   HGBUR NEGATIVE 06/28/2020 0758   BILIRUBINUR NEGATIVE 06/28/2020 0758   BILIRUBINUR negative 07/08/2018 1523   KETONESUR 80 (A) 06/28/2020 0758   PROTEINUR NEGATIVE 06/28/2020 0758   UROBILINOGEN 0.2 06/27/2020 1354   NITRITE NEGATIVE 06/28/2020 0758   LEUKOCYTESUR NEGATIVE 06/28/2020 0758   Recent Results (from the past 240 hour(s))  SARS Coronavirus 2 by RT PCR (hospital order, performed in [facility] hospital lab) Nasopharyngeal Nasopharyngeal Swab     Status: None   Collection Time: 06/28/20  1:38 PM   Specimen: Nasopharyngeal Swab  Result Value Ref Range Status   SARS Coronavirus 2 NEGATIVE NEGATIVE Final    Comment: (NOTE) SARS-CoV-2 target nucleic acids are NOT DETECTED.  The SARS-CoV-2 RNA is generally detectable in upper and lower respiratory specimens during the acute phase of infection. The lowest concentration of SARS-CoV-2 viral copies this assay can detect is 250 copies / mL. A negative result does not preclude SARS-CoV-2 infection and should not be used as the sole basis for treatment or other patient management decisions.  A negative result may occur with improper specimen collection / handling, submission of specimen other than nasopharyngeal swab, presence of viral mutation(s) within the areas targeted by this assay, and inadequate number of viral copies (<250 copies / mL). A negative result must be combined with clinical observations, patient history, and epidemiological information.  Fact Sheet for Patients:   StrictlyIdeas.no  Fact Sheet for Healthcare Providers: BankingDealers.co.za  This test is not yet approved or  cleared by the [place] FDA and has been authorized for detection and/or diagnosis of SARS-CoV-2 by FDA under an Emergency Use Authorization (EUA).  This EUA will remain in effect (meaning this test can be used) for the duration of  the COVID-19 declaration under Section 564(b)(1) of the Act, 21 U.S.C. section 360bbb-3(b)(1), unless the authorization is terminated or revoked sooner.  Performed at [facility], [place] [address], [place], [place] [address]   MRSA PCR Screening     Status: None   Collection  Time: 06/28/20  8:54 PM   Specimen: Nasal Mucosa; Nasopharyngeal  Result Value Ref Range Status   MRSA by PCR NEGATIVE NEGATIVE Final    Comment:        The GeneXpert MRSA Assay (FDA approved for NASAL specimens only), is one component of a comprehensive MRSA colonization surveillance program. It is not intended to diagnose MRSA infection nor to guide or monitor treatment for MRSA infections. Performed at [facility], [place] [address] [name], [place] [address]       Radiology Studies: VAS [place] UPPER EXTREMITY VENOUS DUPLEX  Result Date: 07/04/2020 UPPER VENOUS STUDY  Indications: Edema Risk Factors: None identified. Comparison Study: No prior studies. Performing Technologist: Oliver Hum RVT  Examination Guidelines: A complete evaluation includes B-mode imaging, spectral Doppler, color Doppler, and power Doppler as needed of all accessible portions of each vessel. Bilateral testing is considered an integral part of a complete examination. Limited examinations for reoccurring indications may be performed as noted.  Right Findings: +----------+------------+---------+-----------+----------+-----------------+ RIGHT     CompressiblePhasicitySpontaneousProperties     Summary      +----------+------------+---------+-----------+----------+-----------------+ IJV           Full       Yes       Yes                                +----------+------------+---------+-----------+----------+-----------------+ Subclavian  Partial      No        No               Age Indeterminate +----------+------------+---------+-----------+----------+-----------------+ Axillary       Full       Yes       Yes                                +----------+------------+---------+-----------+----------+-----------------+ Brachial      Full       Yes       Yes                                +----------+------------+---------+-----------+----------+-----------------+ Radial        Full                                                    +----------+------------+---------+-----------+----------+-----------------+ Ulnar         Full                                                    +----------+------------+---------+-----------+----------+-----------------+ Cephalic      Full                                                    +----------+------------+---------+-----------+----------+-----------------+ Basilic       Full                                                    +----------+------------+---------+-----------+----------+-----------------+  Left Findings: +----------+------------+---------+-----------+----------+-------+ LEFT      CompressiblePhasicitySpontaneousPropertiesSummary +----------+------------+---------+-----------+----------+-------+ Subclavian    Full       Yes       Yes                      +----------+------------+---------+-----------+----------+-------+  Summary:  Right: No evidence of superficial vein thrombosis in the upper extremity. 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  Left: No evidence of thrombosis in the subclavian.  *See table(s) above for measurements and observations.  Diagnosing physician: Servando Snare MD Electronically signed by Servando Snare MD on 07/04/2020 at 5:29:09 PM.    Final     Scheduled Meds: . sodium chloride   Intravenous Once  . sodium chloride   Intravenous Once  . amiodarone  200 mg Oral BID   Followed by  . [START ON 07/09/2020] amiodarone  200 mg Oral Daily  . carvedilol  12.5 mg Oral BID WC  . chlorhexidine  15 mL Mouth Rinse BID   . Chlorhexidine Gluconate Cloth  6 each Topical Daily  . divalproex  125 mg Oral Q12H  . feeding supplement ([employer] STANDARD 1.4)  325 mL Oral BID BM  . hydrocortisone cream   Topical TID  . insulin aspart  0-5 Units Subcutaneous QHS  . insulin aspart  0-9 Units Subcutaneous TID WC  . insulin glargine  6 Units Subcutaneous QHS  . magnesium citrate  0.5 Bottle Oral Once  . magnesium oxide  400 mg Oral Daily  . mouth rinse  15 mL Mouth Rinse q12n4p  . multivitamin with minerals  1 tablet Oral Daily  . polyethylene glycol  17 g Oral BID  . rosuvastatin  5 mg Oral Daily  . sacubitril-valsartan  1 tablet Oral BID  . sodium chloride flush  10-40 mL Intracatheter Q12H   Continuous Infusions: . lactated ringers 10 mL/hr at 07/05/20 1326     LOS: 7 days   Time spent: 35 minutes.  Patrecia Pour, MD [facility] [email or web address] 07/05/2020, 3:43 PM

## 2020-07-05 NOTE — Progress Notes (Signed)
Patient ID: Tony Marshall, male   DOB: 04-01-1941, 79 y.o.   MRN: [id number]    Progress Note   Subjective   Day # 7 Asked to reevaluate this patient who had been followed by GI earlier this admission with fecal impaction/overflow diarrhea and some rectal bleeding which was presumed to be secondary to associated stercoral ulcer.  He did not undergo endoscopic evaluation as he did not have any significant bleeding or drop in hemoglobin. Last colonoscopy was done in August 2017, which showed multiple left colon diverticuli and internal and external hemorrhoids.  Patient was diagnosed with a right subclavian DVT yesterday, and anticoagulation was initiated with heparin. Within 2 hours of starting heparin patient had recurrent rectal bleeding, with passage of maroon stool. Heparin was stopped  Hemoglobin 4 days ago 9.4, yesterday a.m. hemoglobin 10.4 last p.m. hemoglobin 10.5- 9.5 this a.m.  Patient denies any abdominal or rectal pain. Says he feels okay this morning, does not seem to remember bleeding last night. His wife who is at bedside said he had two episodes last evening, passed a little bit of blood a few hours later, no active bleeding today. It sounds as if he is still had some oozing of stool. They both report that he has not been eating or drinking much of anything, absolutely no appetite.        Objective   Vital signs in last 24 hours: Temp:  [97.9 F (36.6 C)-99.6 F (37.6 C)] 97.9 F (36.6 C) (07/21 0721) Pulse Rate:  [58-69] 62 (07/21 0721) Resp:  [12-20] 12 (07/21 0721) BP: (145-165)/(72-94) 164/73 (07/21 0721) SpO2:  [96 %-100 %] 99 % (07/21 0411) Last BM Date: 07/04/20 General:    white male in NAD Heart:  Regular rate and rhythm; no murmurs Lungs: Respirations even and unlabored, lungs CTA bilaterally Abdomen:  Soft, nontender and nondistended. Normal bowel sounds. Extremities:  Without edema. Neurologic:  Alert and oriented,  grossly normal  neurologically. Psych:  Cooperative. Normal mood and affect.  Intake/Output from previous day: 07/20 0701 - 07/21 0700 In: 15.1 [I.V.:15.1] Out: 1000 [Urine:1000] Intake/Output this shift: Total I/O In: -  Out: 1000 [Urine:1000]  Lab Results: Recent Labs    07/05/20 0007 07/05/20 0428 07/05/20 0747  WBC 3.8* 2.4* 2.9*  HGB 10.0* 9.6* 9.5*  HCT 30.7* 29.5* 29.0*  PLT 126* 109* 118*   BMET Recent Labs    07/03/20 0500 07/04/20 0707 07/05/20 0428  NA 136 133* 131*  K 3.2* 3.2* 3.5  CL 100 96* 95*  CO2 29 28 28   GLUCOSE 159* 173* 182*  BUN 8 11 12   CREATININE 0.46* 0.48* 0.46*  CALCIUM 7.2* 7.8* 7.9*   LFT Recent Labs    07/03/20 0500  PROT 3.5*  ALBUMIN 1.7*  AST 17  ALT 9  ALKPHOS 64  BILITOT 0.6   PT/INR No results for input(s): LABPROT, INR in the last 72 hours.  Studies/Results: VAS [place] UPPER EXTREMITY VENOUS DUPLEX  Result Date: 07/04/2020 UPPER VENOUS STUDY  Indications: Edema Risk Factors: None identified. Comparison Study: No prior studies. Performing Technologist: Oliver Hum RVT  Examination Guidelines: A complete evaluation includes B-mode imaging, spectral Doppler, color Doppler, and power Doppler as needed of all accessible portions of each vessel. Bilateral testing is considered an integral part of a complete examination. Limited examinations for reoccurring indications may be performed as noted.  Right Findings: +----------+------------+---------+-----------+----------+-----------------+ RIGHT     CompressiblePhasicitySpontaneousProperties     Summary      +----------+------------+---------+-----------+----------+-----------------+  IJV           Full       Yes       Yes                                +----------+------------+---------+-----------+----------+-----------------+ Subclavian  Partial      No        No               Age Indeterminate +----------+------------+---------+-----------+----------+-----------------+  Axillary      Full       Yes       Yes                                +----------+------------+---------+-----------+----------+-----------------+ Brachial      Full       Yes       Yes                                +----------+------------+---------+-----------+----------+-----------------+ Radial        Full                                                    +----------+------------+---------+-----------+----------+-----------------+ Ulnar         Full                                                    +----------+------------+---------+-----------+----------+-----------------+ Cephalic      Full                                                    +----------+------------+---------+-----------+----------+-----------------+ Basilic       Full                                                    +----------+------------+---------+-----------+----------+-----------------+  Left Findings: +----------+------------+---------+-----------+----------+-------+ LEFT      CompressiblePhasicitySpontaneousPropertiesSummary +----------+------------+---------+-----------+----------+-------+ Subclavian    Full       Yes       Yes                      +----------+------------+---------+-----------+----------+-------+  Summary:  Right: No evidence of superficial vein thrombosis in the upper extremity. 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  Left: No evidence of thrombosis in the subclavian.  *See table(s) above for measurements and observations.  Diagnosing physician: Servando Snare MD Electronically signed by Servando Snare MD on 07/04/2020 at 5:29:09 PM.    Final        Assessment / Plan:     #42 79 year old white male with multiple comorbidities, who was admitted 7 days ago with complaints of rectal bleeding. This is in the setting of CLL for which  she has been undergoing chemotherapy, adult onset diabetes  mellitus, hypertension and congestive heart failure with a EF 20 to 25%. Patient also diagnosed with atrial fibrillation since admission and started on amiodarone. Patient was evaluated by GI earlier this admission fecal impaction overflow type diarrhea and rectal bleeding felt likely secondary to a stercoral ulcer. He did not manifest any hemodynamically significant bleeding and decision was made not to proceed with endoscopic evaluation. He underwent disimpaction etc. Patient diagnosed with a right subclavian DVT yesterday and heparin was started.  Patient developed recurrent hematochezia with maroon stool x2 last night. Heparin has been discontinued. He has been hemodynamically stable overnight without further active hemorrhage. Hemoglobin has drifted about 1 g, but he was also noted to have a pancytopenia at this time.  Etiology of his bleeding still likely secondary to stercoral ulceration versus diverticular. He did have a colonoscopy in 2017 with finding of significant diverticulosis and internal and external hemorrhoids. Because of the need for anticoagulation at this time endoscopic evaluation is indicated  Plan; keep n.p.o. this a.m. Serial hemoglobins every 8 hours and transfuse for hemoglobin less than eight Patient has been scheduled for flexible sigmoidoscopy with Dr. Tarri Glenn this afternoon. We will give two tapwater enemas this morning. Procedure was discussed in detail with patient and his wife including indications risks and benefits and they are agreeable to proceed. Further plans pending findings at sigmoidoscopy. Hold anticoagulation until after sigmoidoscopy today.          Active Problems:   Essential hypertension   Pure hypercholesterolemia   CLL (chronic lymphocytic leukemia) (HCC)   Increased anion gap metabolic acidosis   Symptomatic anemia   Hyperglycemia due to diabetes mellitus (HCC)   DKA (diabetic ketoacidoses) (HCC)   Systolic and diastolic CHF,  chronic (HCC)   Fecal impaction (HCC)   Overflow diarrhea   Rectal bleeding   Lower GI bleed   Central venous catheter in place   Shock circulatory ([place])   Prolonged QT interval   Atrial fibrillation ([place])     LOS: 7 days   Hendrixx Severin PA-C 07/05/2020, 8:42 AM

## 2020-07-06 DIAGNOSIS — E1110 Type 2 diabetes mellitus with ketoacidosis without coma: Secondary | ICD-10-CM | POA: Diagnosis not present

## 2020-07-06 DIAGNOSIS — C9110 Chronic lymphocytic leukemia of B-cell type not having achieved remission: Secondary | ICD-10-CM | POA: Diagnosis not present

## 2020-07-06 DIAGNOSIS — I480 Paroxysmal atrial fibrillation: Secondary | ICD-10-CM | POA: Diagnosis not present

## 2020-07-06 DIAGNOSIS — Z789 Other specified health status: Secondary | ICD-10-CM | POA: Diagnosis not present

## 2020-07-06 LAB — CBC
HCT: 25.7 % — ABNORMAL LOW (ref 39.0–52.0)
Hemoglobin: 8.4 g/dL — ABNORMAL LOW (ref 13.0–17.0)
MCH: 30.1 pg (ref 26.0–34.0)
MCHC: 32.7 g/dL (ref 30.0–36.0)
MCV: 92.1 fL (ref 80.0–100.0)
Platelets: 106 10*3/uL — ABNORMAL LOW (ref 150–400)
RBC: 2.79 MIL/uL — ABNORMAL LOW (ref 4.22–5.81)
RDW: 17.3 % — ABNORMAL HIGH (ref 11.5–15.5)
WBC: 2.5 10*3/uL — ABNORMAL LOW (ref 4.0–10.5)
nRBC: 0 % (ref 0.0–0.2)

## 2020-07-06 LAB — BASIC METABOLIC PANEL
Anion gap: 8 (ref 5–15)
BUN: 13 mg/dL (ref 8–23)
CO2: 32 mmol/L (ref 22–32)
Calcium: 7.9 mg/dL — ABNORMAL LOW (ref 8.9–10.3)
Chloride: 95 mmol/L — ABNORMAL LOW (ref 98–111)
Creatinine, Ser: 0.56 mg/dL — ABNORMAL LOW (ref 0.61–1.24)
GFR calc Af Amer: >60 mL/min (ref >60)
GFR calc non Af Amer: >60 mL/min (ref >60)
Glucose, Bld: 169 mg/dL — ABNORMAL HIGH (ref 70–99)
Potassium: 2.9 mmol/L — ABNORMAL LOW (ref 3.5–5.1)
Sodium: 135 mmol/L (ref 135–145)

## 2020-07-06 LAB — HEMOGLOBIN AND HEMATOCRIT, BLOOD
HCT: 30.7 % — ABNORMAL LOW (ref 39.0–52.0)
Hemoglobin: 9.9 g/dL — ABNORMAL LOW (ref 13.0–17.0)

## 2020-07-06 LAB — EXPECTORATED SPUTUM ASSESSMENT W GRAM STAIN, RFLX TO RESP C

## 2020-07-06 LAB — GLUCOSE, CAPILLARY
Glucose-Capillary: 120 mg/dL — ABNORMAL HIGH (ref 70–99)
Glucose-Capillary: 123 mg/dL — ABNORMAL HIGH (ref 70–99)
Glucose-Capillary: 251 mg/dL — ABNORMAL HIGH (ref 70–99)
Glucose-Capillary: 165 mg/dL — ABNORMAL HIGH (ref 70–99)

## 2020-07-06 MED ORDER — SUCRALFATE 1 GM/10ML PO SUSP
2.0000 g | Freq: Two times a day (BID) | ORAL | Status: DC
  Administered 2020-07-06 – 2020-07-07 (×3): 2 g via RECTAL
  Filled 2020-07-06 (×5): qty 20

## 2020-07-06 MED ORDER — POTASSIUM CHLORIDE CRYS ER 20 MEQ PO TBCR
40.0000 meq | EXTENDED_RELEASE_TABLET | Freq: Two times a day (BID) | ORAL | Status: DC
  Administered 2020-07-06 – 2020-07-11 (×11): 40 meq via ORAL
  Filled 2020-07-06 (×11): qty 2

## 2020-07-06 NOTE — Progress Notes (Signed)
Nutrition Follow-up  DOCUMENTATION CODES:   Severe malnutrition in context of chronic illness  INTERVENTION:  - continue [employer] BID. - recommend small bore NGT placement and initiation of TF. - recommended goal rate for TF: Osmolite 1.5 @ 55 ml/hr with 45 ml Prosource TF (or equivalent) BID and 100 ml free water every 4 hours to provide 2060 kcal, 105 grams protein, and 1606 ml free water.  - if TF initiated, patient at very high risk for refeeding.    NUTRITION DIAGNOSIS:   Severe Malnutrition related to chronic illness, cancer and cancer related treatments as evidenced by moderate fat depletion, moderate muscle depletion, severe muscle depletion, percent weight loss. -revised  GOAL:   Patient will meet greater than or equal to 90% of their needs -unmet  MONITOR:   PO intake, Supplement acceptance, Labs, Weight trends  ASSESSMENT:   79 y.o. male with medical history of CLL, DM, HTN, HLD, and CHF. He presented to the ED with complaints of diarrhea which started suddenly on 7/13. Noted to be maroon in color with blood clots. Patient was recently discharged on 7/6 after treatment of septic shock from BLE cellulitis.  Patient was advanced to Heart Healthy/Carb Modified diet on 7/14, downgraded to CLD on 7/20 at [facility], and made NPO yesterday at 1036. Diet then advanced to Regular yesterday at 1405. The only documented meal since admission was yesterday: 0% of breakfast.   Patient sitting in chair with wife and daughter at bedside. Patient has had persistent diarrhea. S/p flex sig 7/21 due to fecal impaction. Manual disimpaction done at that time. Orders in place for Mg citrate, miralax, and psyllium to help prevent further impaction. Notes indicate diarrhea prior to disimpaction d/t overflow.   Patient had previously reported to family to that foods and drinks taste "bad." But he does not give further detail and reports to RD that things are fine. Suspect taste alteration is ongoing.  Wife reports he has been off of chemo x3-4 weeks.   Patient took 1 bite each of a bagel and a piece of bacon for breakfast and then did not want anything more. Review of order indicates he has been accepting [employer] 50% of the time offered and family confirms this. Patient confirms that he finds it easier to drink items than to chew foods. He has also been experiencing feeling of gagging and like food boluses are getting bigger and bigger and he cannot swallow them.  Lack of appetite and interest. He has profound weakness and needs several people to help him with ambulation.   He has lost 11% body weight in the past 2 months; significant for time frame.   Talked with patient and family about the idea of small bore NGT to meet estimated nutrition needs and to help with restoring strength. Talked with them about him being able to still eat and drink with tube in and the hope that this will be a short term need until he is able to meet needs orally. Family and patient are appreciative of information and would like to further discuss as a family and think about this option.    Labs reviewed; K: 3.4 mmol/l, Ca: 8.1 mg/dl. Medications reviewed; sliding scale novolog, 6 units lantus/day, 400 mg mag-ox/day, 1 tablet multivitamin with minerals/day, 17 g miralax BID, 40 mEq Klor-Con BID, 1 packet metamucil/day. IVF; LR @ 100 ml/hr.     NUTRITION - FOCUSED PHYSICAL EXAM:    Most Recent Value  Orbital Region Moderate depletion  Upper Arm Region Severe depletion  Thoracic and Lumbar Region Unable to assess  Buccal Region Moderate depletion  Temple Region Mild depletion  Clavicle Bone Region Severe depletion  Clavicle and Acromion Bone Region Severe depletion  Scapular Bone Region Unable to assess  Dorsal Hand Moderate depletion  Patellar Region Severe depletion  Anterior Thigh Region Unable to assess  Posterior Calf Region Severe depletion  Edema (RD Assessment) Mild  [BLE]  Hair Reviewed  Eyes  Reviewed  Mouth Reviewed  Skin Reviewed  Nails Reviewed       Diet Order:   Diet Order            Diet regular Room service appropriate? Yes; Fluid consistency: Thin  Diet effective now                 EDUCATION NEEDS:   No education needs have been identified at this time  Skin:  Skin Assessment: Reviewed RN Assessment  Last BM:  7/20  Height:   Ht Readings from Last 1 Encounters:  07/05/20 5\' 7"  (1.702 m)    Weight:   Wt Readings from Last 1 Encounters:  07/06/20 56.7 kg     Estimated Nutritional Needs:  Kcal:  2000-2200 kcal Protein:  105-120 grams Fluid:  >/= 2.4 L/day     Jarome Matin, MS, RD, LDN, CNSC Inpatient Clinical Dietitian RD pager # available in [facility]  After hours/weekend pager # available in [facility]

## 2020-07-06 NOTE — Progress Notes (Signed)
Report received from Almon Hercules. No change in assessment. Pt continues to have frequent stools per wife. Will continue to monitor. Will continue plan of care. Scottlynn Lindell, Laurel Dimmer, RN

## 2020-07-06 NOTE — Evaluation (Signed)
Physical Therapy Evaluation Patient Details Name: Tony Marshall MRN: [id number] DOB: 05-07-1941 Today's Date: 07/06/2020   History of Present Illness  Tony Marshall is a 79 y.o. male with a history of CLL, chronic combined CHF (Dx June 2021), T2DM, HTN, HLD, diverticulosis who presented to the ED 7/14 with hematochezia found to be in DKA and admitted to ICU on insulin infusion with resolution of DKA. Atrial fibrillation with RVR developed for which cardiology was consulted, started amiodarone IV. The hematochezia was thought to be due to fecal impaction with stercoral ulcer and resolved with conservative treatment including transfusions. Bowel regimen was started. Due to arm swelling, U/S was performed demonstrating an age-indeterminate subclavian DVT. Heparin was started without bolus with subsequent recurrence of GI bleeding. Heparin was stopped and GI performed flexible sigmoidoscopy limited by significant stool burden/fecal impaction. Incomplete evacuation was performed 7/21.  He also had protracted hospitalization recently from involving septic shock possibly due to RLL cellulitis initially requiring pressors, improved on ancef.    Clinical Impression  Tony Marshall is 79 y.o. male admitted with above HPI and diagnosis. Patient is currently limited by functional impairments below (see PT problem list). Patient lives with his wife and prior to hospital admission beginning in June 2021 was independent at baseline. Patient now requires min assist for bed mobility and functional transfers with RW. He was limited this session by reports of dizziness and by diarrhea. Patient will benefit from continued skilled PT interventions to address impairments and progress independence with mobility, recommending return home with 24/y assist from family and HHPT for strengthening. Acute PT will follow and progress as able. Patient may require a wheelchair to travel functional distances due to impairments  limited gait. Acute PT will progress and update recommendations as pt progresses.    Follow Up Recommendations Home health PT;Supervision/Assistance - 24 hour    Equipment Recommendations  Wheelchair (measurements PT) (may need for functional distances)    Recommendations for Other Services       Precautions / Restrictions Precautions Precautions: Fall Precaution Comments: monitor HR Restrictions Weight Bearing Restrictions: No      Mobility  Bed Mobility Overal bed mobility: Needs Assistance Bed Mobility: Supine to Sit;Sit to Supine     Supine to sit: HOB elevated;Min assist Sit to supine: Min assist      Transfers Overall transfer level: Needs assistance Equipment used: Rolling walker (2 wheeled) Transfers: Sit to/from [facility] Sit to Stand: Min assist;From elevated surface Stand pivot transfers: Min assist       General transfer comment: Min assist with VCs for technique with RW. Assist to rise and cues for posture. Vc's for step sequencing to move to [facility] as pt experienced bowel incontinence with diarrhea. Pt's BP dropped slighty with upright activity (into 110's/60's) after return to supine pt's BP retuned to 132/76.  Ambulation/Gait         Stairs       Wheelchair Mobility    Modified Rankin (Stroke Patients Only)       Balance Overall balance assessment: Needs assistance Sitting-balance support: Feet supported;Bilateral upper extremity supported Sitting balance-Leahy Scale: Fair     Standing balance support: Bilateral upper extremity supported;During functional activity Standing balance-Leahy Scale: Poor Standing balance comment: reliant on external support with RW            Pertinent Vitals/Pain Pain Assessment: No/denies pain Faces Pain Scale: No hurt    Home Living Family/patient expects to be discharged to:: Private residence Living  Arrangements: Spouse/significant other Available Help at Discharge:  Family Type of Home: House Home Access: Level entry     Home Layout: One level Home Equipment: Environmental consultant - 2 wheels;Bedside commode;Toilet riser;Walker - 4 wheels;Hospital bed Additional Comments: sunk in living room (3 steps to kitchen), pt does not have to go down or up steps to get to living room where his bed is.    Prior Function Level of Independence: Independent   Gait / Transfers Assistance Needed: pt has not required use of RW or any AD for mobility prior to recent hospitalization in June due to Rt LE cellulitis and sepsis. Pt and wife report it has been ~ 5 weeks since he has walked a functional distance in home.     Comments: retired from working with [employer], [employer] (consulting); enjoys fishing/hunting and Oceanographer Dominance   Dominant Hand: Right    Extremity/Trunk Assessment   Upper Extremity Assessment Upper Extremity Assessment: Defer to OT evaluation    Lower Extremity Assessment Lower Extremity Assessment: Generalized weakness    Cervical / Trunk Assessment Cervical / Trunk Assessment: Kyphotic  Communication   Communication: HOH  Cognition Arousal/Alertness: Awake/alert Behavior During Therapy: Flat affect Overall Cognitive Status: Impaired/Different from baseline Area of Impairment: Orientation          Orientation Level: Place;Time;Disoriented to Current Attention Level: Sustained Memory: Decreased short-term memory       Problem Solving: Slow processing;Difficulty sequencing;Requires verbal cues;Requires tactile cues General Comments: pt appears to having improved awareness and orientation this PM compared to AM per pt's wife. pt is oriented to recent hospital admissions and current admission. Pt is aware of weaknesses and limited mobility. pt seems quiet with slightly flat affect, however does engage in coversation and smiles at disussion of past hobbies and retirement.             Assessment/Plan    PT Assessment Patient needs continued PT  services  PT Problem List Decreased strength;Decreased mobility;Decreased activity tolerance;Decreased balance;Decreased knowledge of use of DME;Pain;Decreased cognition       PT Treatment Interventions DME instruction;Therapeutic exercise;Gait training;Balance training;Functional mobility training;Therapeutic activities;Patient/family education    PT Goals (Current goals can be found in the Care Plan section)  Acute Rehab PT Goals Patient Stated Goal: improve strength and independence    Frequency Min 3X/week    AM-PAC PT "6 Clicks" Mobility  Outcome Measure Help needed turning from your back to your side while in a flat bed without using bedrails?: A Little Help needed moving from lying on your back to sitting on the side of a flat bed without using bedrails?: A Little Help needed moving to and from a bed to a chair (including a wheelchair)?: A Little Help needed standing up from a chair using your arms (e.g., wheelchair or bedside chair)?: A Little Help needed to walk in hospital room?: A Lot Help needed climbing 3-5 steps with a railing? : Total 6 Click Score: 15    End of Session Equipment Utilized During Treatment: Gait belt Activity Tolerance: Patient tolerated treatment well Patient left: in bed;with call bell/phone within reach;with family/visitor present (with MD in room) Nurse Communication: Mobility status PT Visit Diagnosis: Other abnormalities of gait and mobility (R26.89);Pain    Time: 7124-5809 PT Time Calculation (min) (ACUTE ONLY): 39 min   Charges:   PT Evaluation $PT Eval Moderate Complexity: 1 Mod PT Treatments $Therapeutic Activity: 23-37 mins      Gwynneth Albright PT, DPT Acute Rehabilitation Services  Office [phone] Pager [phone]  07/06/2020 2:58 PM

## 2020-07-06 NOTE — Plan of Care (Signed)
  Problem: Education: Goal: Knowledge of General Education information will improve Description: Including pain rating scale, medication(s)/side effects and non-pharmacologic comfort measures Outcome: Completed/Met

## 2020-07-06 NOTE — Progress Notes (Signed)
PROGRESS NOTE  Tony Marshall  XTG:[id number] DOB: 1941/03/27 DOA: 06/28/2020 PCP: Elayne Snare, MD   Brief Narrative: Tony Marshall is a 79 y.o. male with a history of CLL, chronic combined CHF (Dx June 2021), T2DM, HTN, HLD, diverticulosis who presented to the ED 7/14 with hematochezia found to be in DKA and admitted to ICU on insulin infusion with resolution of DKA. Atrial fibrillation with RVR developed for which cardiology was consulted, started amiodarone IV. The hematochezia was thought to be due to fecal impaction with stercoral ulcer and resolved with conservative treatment including transfusions. Bowel regimen was started. Due to arm swelling, U/S was performed demonstrating an age-indeterminate subclavian DVT. Heparin was started without bolus with subsequent recurrence of GI bleeding. Heparin was stopped and GI performed flexible sigmoidoscopy limited by significant stool burden/fecal impaction. Incomplete evacuation was performed 7/21. Magnesium citrate is added and bowel program has been augmented.   He also had protracted hospitalization involving septic shock possibly due to RLL cellulitis initially requiring pressors, improved on ancef.  Assessment & Plan: Active Problems:   Essential hypertension   Pure hypercholesterolemia   CLL (chronic lymphocytic leukemia) (HCC)   Increased anion gap metabolic acidosis   Symptomatic anemia   Hyperglycemia due to diabetes mellitus (HCC)   DKA (diabetic ketoacidoses) (HCC)   Systolic and diastolic CHF, chronic (HCC)   Fecal impaction (HCC)   Overflow diarrhea   Rectal bleeding   Lower GI bleed   Central venous catheter in place   Shock circulatory ([place])   Prolonged QT interval   Atrial fibrillation (HCC)   Stercoral ulcer of rectum  Acute blood loss anemia due to GI bleeding due to stercoral ulcer due to fecal impaction:  - s/p 2u PRBCs 7/17. Hgb down 10.1 > 8.4g/dl with scant bleeding still noted. Will recheck in PM with goal  hgb 8g/dl or higher.  - Holding heparin. GI believes continued risk of hemorrhage is very high while ulcer is present.  - Treat fecal impaction. Has had manual disimpaction at time of sigmoidoscopy. Continue miralax BID, psyllium. Having significant watery stools without any solid reported. Will add carafate enema BID. - Plan to continue laxative/stool softener lifelong to prevent recurrence and worsening of descending/sigmoid diverticulosis, hemorrhoids.   Cough: No abnormalities on exam, no hypoxia, no hemoptysis. No fever.  - Sputum gram stain polymicrobial, culture pending, if cough worsens consider CXR.  - Strongly suspect atelectasis. Incentive spirometry encouraged, ordered.   Age-indeterminate right subclavian DVT: Likely related to PICC that was in place during previous hospitalization (6/21 - 7/6). - Unable to anticoagulate at this time. Discussed risks and benefits w/family.   DKA in T2DM: Resolved s/p IV insulin. HbA1c 7.7% last month. DM followed by Dr. Dwyane Dee, Dx 1992. - Continue lantus 6u + SSI. At inpatient goal - Diabetes coordinator consulted   New onset atrial fibrillation with RVR:  - Continue telemetry, keep K, Mg replete. TSH 3.521.  - Continue amiodarone with planned taper 200mg  po BID > 200mg  qd on 7/25. - Continue coreg. Avoid CCB's w/LV systolic dysfunction. - [id number] score is 6 (CHF, HTN, age x2, T2DM, vascular calcifications by imaging), though holding anticoagulation in setting of GI bleed as above.    Chronic combined HFrEF: Roughly euvolemic on exam, though I/O shows net positive. Echo 06/07/2020 w/LVEF 25-30%, G2DD, RV appears normal in structure and function.  - Continue entresto (newly started), dose per cardiology. Cr, BP stable.  - No current need for diuretic.  - RHC/LHC was planned  once medically stabilized following recent hospitalization for septic shock.  CLL: Dx Oct 2014, Tx initiated 04/04/2020 with bendamustine/rituximab now s/p 3 cycles (last  6/15) and plan seems to be for observation. Tolerates chemo poorly w/fatigue, weakness, weight loss, poor appetite, etc.  - Pancytopenia possibly related to chemotherapy, appears stable.  Severe protein calorie malnutrition: Continues to have very minimal per oral intake. No related to nausea, vomiting, abdominal pain. Not related to any diet restrictions (there are none). It has persisted since chemotherapy.  - Supplement protein, unrestrict diet. Could consider tube feeding, though I wonder if that will correct the underlying problem. This was discussed with the patient and wife today who will continue consideration and we will discuss again 7/23.   OSA:  - CPAP qHS encouraged  Diffuse atherosclerosis of coronary arteries noted on imaging: No chest pain.  - On beta blocker, statin. No aspirin or anticoagulation at this time.   Prolonged QT interval: Stable, chronic.  - Minimize provocative agents.   Acute metabolic encephalopathy: Improved.  - Continue depakote and delirium precautions. Family at bedside appears to be helpful.   HTN:  - Cardiac medications as above  HLD:  - Continue statin  Hypokalemia: Improved.   DVT prophylaxis: SCDs Code Status: Full Family Communication: Wife at bedside Disposition Plan:  Status is: Inpatient  Remains inpatient appropriate because:Inpatient level of care appropriate due to severity of illness   Dispo: The patient is from: Home              Anticipated d/c is to: Home with home health              Anticipated d/c date is: 2 days              Patient currently is not medically stable to d/c.  Consultants:   GI  Cardiology  Procedures:   Flexible sigmoidoscopy 07/05/2020 Dr. Tarri Glenn:  Findings:      Non-bleeding external hhemorrhoids were found on perianal exam.      A decubitus ulcer was also identified on perianal exam.      A single (solitary) ulcer was found in the rectum. No active bleeding       was present.      A large  amount of solid stool was found in the rectum consistent with       fecal impaction, precluding any meaningful visualization. I manually       disimpacted the large rectal stool ball. Too much stool remained for any       additional meaningful evaluation of the colonic mucosa. Impression:       - Preparation of the colon was inadequate due to                            fecal impaction. Manual disimpaction performed                            during the procedure.                           - Non-bleeding external hemorrhoids found on                            perianal exam.                           -  Decubitus ulcer identified on perianal exam.                           - A single (solitary) ulcer in the rectum                            consistent with stercoral ulcer - the source of                            recent bleeding. Not actively bleeding.                           - No specimens collected. Moderate Sedation:      Not Applicable - Patient had care per Anesthesia. Recommendation:           - Return patient to hospital ward for ongoing care.                           - Advance diet as tolerated.                           - Aggressive bowel regimen to prevent recurrence of                            fecal impaction including Miralax 17 g BID,                            psyllium daily, and drinking at least 64 ounces of                            water daily.                           - Consider Carafate enemas 2 gm in 20 ml water BID                            x 6 weeks.                           - Avoid anticoagulation as able as this ulcer is at                            high risk for rebleeding.                           - I alerted the hospitalist team to the finding of                            the decubitus ulcer.                           I discussed these findings and recommendations with                            the patient in the  endoscopy recovery area and with                             his wife by telephone.                           Please call the on-call gastroenterologist with any                            additional questions or concerns during this                            hospitalization.  Antimicrobials:  None   Subjective: Got very tired with PT and OT today, having many liquid stools overnight without any solid components. No abdominal pain, N/V. Gets food in his mouth and just can't swallow it because he doesn't want it. No chest pain or other problems. Some small amount of blood is felt to be present.  Objective: Vitals:   07/06/20 0500 07/06/20 0616 07/06/20 0800 07/06/20 0856  BP: (!) 145/79  (!) 151/84 (!) 151/67  Pulse: 67  61 (!) 59  Resp: 13  14 14   Temp: 98.5 F (36.9 C)   98.6 F (37 C)  TempSrc: Oral   Oral  SpO2: 98%  95% 95%  Weight:  56.7 kg    Height:        Intake/Output Summary (Last 24 hours) at 07/06/2020 1650 Last data filed at 07/06/2020 1400 Gross per 24 hour  Intake 240 ml  Output 1700 ml  Net -1460 ml   Filed Weights   07/04/20 0507 07/05/20 1302 07/06/20 0616  Weight: 61.7 kg 61.7 kg 56.7 kg   Gen: Frail male in no distress Pulm: Nonlabored breathing room air. Clear. CV: Regular rate and rhythm. No murmur, rub, or gallop. No JVD, no dependent edema. GI: Abdomen soft, non-tender, non-distended, with normoactive bowel sounds. No stool burden is palpable.  Ext: Warm, no deformities Skin: No rashes, lesions or ulcers on visualized skin. Neuro: Alert and oriented. No focal neurological deficits. Psych: Judgement and insight appear fair. Mood euthymic & affect congruent. Behavior is appropriate.    Data Reviewed: I have personally reviewed following labs and imaging studies  CBC: Recent Labs  Lab 07/03/20 0500 07/04/20 0707 07/05/20 0428 07/05/20 0747 07/05/20 1124 07/05/20 1729 07/06/20 0501  WBC 2.4*   < > 2.4* 2.9* 2.4* 3.2* 2.5*  NEUTROABS 1.6*  --   --   --   --   --   --   HGB  9.3*   < > 9.6* 9.5* 9.6* 10.1* 8.4*  HCT 28.8*   < > 29.5* 29.0* 29.0* 30.8* 25.7*  MCV 92.6   < > 92.8 92.4 92.1 90.9 92.1  PLT PLATELET CLUMPS NOTED ON SMEAR, UNABLE TO ESTIMATE   < > 109* 118* 110* 116* 106*   < > = values in this interval not displayed.   Basic Metabolic Panel: Recent Labs  Lab 07/01/20 0500 07/01/20 0500 07/01/20 1920 07/01/20 1920 07/02/20 0430 07/03/20 0500 07/04/20 0707 07/05/20 0428 07/06/20 0501  NA 132*   < > 133*   < > 135 136 133* 131* 135  K 3.1*   < > 3.0*   < > 3.6 3.2* 3.2* 3.5 2.9*  CL 101   < > 103   < >  101 100 96* 95* 95*  CO2 25   < > 25   < > 25 29 28 28  32  GLUCOSE 93   < > 196*   < > 212* 159* 173* 182* 169*  BUN 7*   < > 9   < > 9 8 11 12 13   CREATININE 0.46*   < > 0.60*   < > 0.49* 0.46* 0.48* 0.46* 0.56*  CALCIUM 7.7*   < > 7.5*   < > 7.7* 7.2* 7.8* 7.9* 7.9*  MG 1.6*  --  1.5*  --  1.7  --  1.7 1.7  --    < > = values in this interval not displayed.   GFR: Estimated Creatinine Clearance: 63 mL/min (A) (by C-G formula based on SCr of 0.56 mg/dL (L)). Liver Function Tests: Recent Labs  Lab 07/03/20 0500  AST 17  ALT 9  ALKPHOS 64  BILITOT 0.6  PROT 3.5*  ALBUMIN 1.7*   No results for input(s): LIPASE, AMYLASE in the last 168 hours. No results for input(s): AMMONIA in the last 168 hours. Coagulation Profile: No results for input(s): INR, PROTIME in the last 168 hours. Cardiac Enzymes: No results for input(s): CKTOTAL, CKMB, CKMBINDEX, TROPONINI in the last 168 hours. BNP (last 3 results) No results for input(s): PROBNP in the last 8760 hours. HbA1C: No results for input(s): HGBA1C in the last 72 hours. CBG: Recent Labs  Lab 07/05/20 1139 07/05/20 1653 07/05/20 1933 07/06/20 0752 07/06/20 1147  GLUCAP 156* 214* 178* 120* 123*   Lipid Profile: No results for input(s): CHOL, HDL, LDLCALC, TRIG, CHOLHDL, LDLDIRECT in the last 72 hours. Thyroid Function Tests: No results for input(s): TSH, T4TOTAL, FREET4, T3FREE,  THYROIDAB in the last 72 hours. Anemia Panel: No results for input(s): VITAMINB12, FOLATE, FERRITIN, TIBC, IRON, RETICCTPCT in the last 72 hours. Urine analysis:    Component Value Date/Time   COLORURINE YELLOW 06/28/2020 0758   APPEARANCEUR CLEAR 06/28/2020 0758   LABSPEC 1.018 06/28/2020 0758   PHURINE 5.0 06/28/2020 0758   GLUCOSEU >=500 (A) 06/28/2020 0758   GLUCOSEU 500 (A) 06/27/2020 1354   HGBUR NEGATIVE 06/28/2020 0758   BILIRUBINUR NEGATIVE 06/28/2020 0758   BILIRUBINUR negative 07/08/2018 1523   KETONESUR 80 (A) 06/28/2020 0758   PROTEINUR NEGATIVE 06/28/2020 0758   UROBILINOGEN 0.2 06/27/2020 1354   NITRITE NEGATIVE 06/28/2020 0758   LEUKOCYTESUR NEGATIVE 06/28/2020 0758   Recent Results (from the past 240 hour(s))  SARS Coronavirus 2 by RT PCR (hospital order, performed in [facility] hospital lab) Nasopharyngeal Nasopharyngeal Swab     Status: None   Collection Time: 06/28/20  1:38 PM   Specimen: Nasopharyngeal Swab  Result Value Ref Range Status   SARS Coronavirus 2 NEGATIVE NEGATIVE Final    Comment: (NOTE) SARS-CoV-2 target nucleic acids are NOT DETECTED.  The SARS-CoV-2 RNA is generally detectable in upper and lower respiratory specimens during the acute phase of infection. The lowest concentration of SARS-CoV-2 viral copies this assay can detect is 250 copies / mL. A negative result does not preclude SARS-CoV-2 infection and should not be used as the sole basis for treatment or other patient management decisions.  A negative result may occur with improper specimen collection / handling, submission of specimen other than nasopharyngeal swab, presence of viral mutation(s) within the areas targeted by this assay, and inadequate number of viral copies (<250 copies / mL). A negative result must be combined with clinical observations, patient history, and epidemiological information.  Fact  Sheet for Patients:   StrictlyIdeas.no  Fact  Sheet for Healthcare Providers: BankingDealers.co.za  This test is not yet approved or  cleared by the [place] FDA and has been authorized for detection and/or diagnosis of SARS-CoV-2 by FDA under an Emergency Use Authorization (EUA).  This EUA will remain in effect (meaning this test can be used) for the duration of the COVID-19 declaration under Section 564(b)(1) of the Act, 21 U.S.C. section 360bbb-3(b)(1), unless the authorization is terminated or revoked sooner.  Performed at [facility], [place] [address] [place], [place] [address]   MRSA PCR Screening     Status: None   Collection Time: 06/28/20  8:54 PM   Specimen: Nasal Mucosa; Nasopharyngeal  Result Value Ref Range Status   MRSA by PCR NEGATIVE NEGATIVE Final    Comment:        The GeneXpert MRSA Assay (FDA approved for NASAL specimens only), is one component of a comprehensive MRSA colonization surveillance program. It is not intended to diagnose MRSA infection nor to guide or monitor treatment for MRSA infections. Performed at [facility], [place] [address], [place], [place] [address]   Expectorated sputum assessment w rflx to resp cult     Status: None   Collection Time: 07/06/20  1:30 AM   Specimen: Expectorated Sputum  Result Value Ref Range Status   Specimen Description EXPECTORATED SPUTUM  Final   Special Requests NONE  Final   Sputum evaluation   Final    THIS SPECIMEN IS ACCEPTABLE FOR SPUTUM CULTURE Performed at [facility], [place] [address], [place], [place] [address]    Report Status 07/06/2020 FINAL  Final  Culture, respiratory     Status: None (Preliminary result)   Collection Time: 07/06/20  1:30 AM  Result Value Ref Range Status   Specimen Description   Final    EXPECTORATED SPUTUM Performed at [place] [address] [place], [place] [address]    Special Requests   Final    NONE Reflexed  from [id number] Performed at [facility], [place] [address], [place], [place] [address]    Gram Stain   Final    RARE WBC PRESENT, PREDOMINANTLY PMN FEW GRAM POSITIVE COCCI RARE GRAM NEGATIVE RODS Performed at [facility], [place] [address] [place], [place] [address]    Culture PENDING  Incomplete   Report Status PENDING  Incomplete      Radiology Studies: No results found.  Scheduled Meds: . sodium chloride   Intravenous Once  . sodium chloride   Intravenous Once  . amiodarone  200 mg Oral BID   Followed by  . [START ON 07/09/2020] amiodarone  200 mg Oral Daily  . barrier cream  1 application Topical BID  . carvedilol  12.5 mg Oral BID WC  . chlorhexidine  15 mL Mouth Rinse BID  . Chlorhexidine Gluconate Cloth  6 each Topical Daily  . divalproex  125 mg Oral Q12H  . feeding supplement (K[employer]MS STANDARD 1.4)  325 mL Oral BID BM  . hydrocortisone cream   Topical TID  . insulin aspart  0-5 Units Subcutaneous QHS  . insulin aspart  0-9 Units Subcutaneous TID WC  . insulin glargine  6 Units Subcutaneous QHS  . magnesium oxide  400 mg Oral Daily  . mouth rinse  15 mL Mouth Rinse q12n4p  . multivitamin with minerals  1 tablet Oral Daily  . polyethylene glycol  17 g  Oral BID  . potassium chloride  40 mEq Oral BID  . psyllium  1 packet Oral Daily  . rosuvastatin  5 mg Oral Daily  . sacubitril-valsartan  1 tablet Oral BID  . sodium chloride flush  10-40 mL Intracatheter Q12H  . sucralfate  2 g Rectal BID   Continuous Infusions: . lactated ringers 10 mL/hr at 07/05/20 1326     LOS: 8 days   Time spent: 35 minutes.  Patrecia Pour, MD [facility] [email or web address] 07/06/2020, 4:50 PM

## 2020-07-06 NOTE — Evaluation (Signed)
Occupational Therapy Evaluation Patient Details Name: Tony Marshall MRN: [id number] DOB: 1941-02-05 Today's Date: 07/06/2020    History of Present Illness Tony Marshall is a 79 y.o. male with a history of CLL, chronic combined CHF (Dx June 2021), T2DM, HTN, HLD, diverticulosis who presented to the ED 7/14 with hematochezia found to be in DKA and admitted to ICU on insulin infusion with resolution of DKA. Atrial fibrillation with RVR developed for which cardiology was consulted, started amiodarone IV. The hematochezia was thought to be due to fecal impaction with stercoral ulcer and resolved with conservative treatment including transfusions. Bowel regimen was started. Due to arm swelling, U/S was performed demonstrating an age-indeterminate subclavian DVT. Heparin was started without bolus with subsequent recurrence of GI bleeding. Heparin was stopped and GI performed flexible sigmoidoscopy limited by significant stool burden/fecal impaction. Incomplete evacuation was performed 7/21.  He also had protracted hospitalization recently from involving septic shock possibly due to RLL cellulitis initially requiring pressors, improved on ancef.   Clinical Impression   Tony Marshall is a 79 year old man who presents with generalized weakness, decreased activity tolerance and impaired balance resulting in decreased ability to perform independent mobility and ADLs. Patient min assist for transfers and mod assist for lower body ADLs and needing to sit and be set up for most ADLs due to poor tolerance. Patient will benefit from skilled OT services to improve deficits and return to PLOF. Patient's spouse reports patient independent before hospitilizations. Patient's spouse reports she wants patient to go home with therapy services.     Follow Up Recommendations  Home health OT    Equipment Recommendations  Other (comment) (wife wants a wheelchair)    Recommendations for Other Services        Precautions / Restrictions Precautions Precautions: Fall Restrictions Weight Bearing Restrictions: No      Mobility Bed Mobility Overal bed mobility: Needs Assistance Bed Mobility: Supine to Sit     Supine to sit: HOB elevated;Min assist        Transfers Overall transfer level: Needs assistance   Transfers: Sit to/from Stand Sit to Stand: Min assist Stand pivot transfers: Min assist       General transfer comment: Min assist and use of RW to take steps to recliner. Verbal cues for safety and technique. loss of balance nearing chair as he did not bring walker with him. Fatigued quickly.    Balance Overall balance assessment: Needs assistance Sitting-balance support: No upper extremity supported;Feet supported Sitting balance-Leahy Scale: Fair     Standing balance support: Bilateral upper extremity supported;During functional activity Standing balance-Leahy Scale: Poor Standing balance comment: required RW                           ADL either performed or assessed with clinical judgement   ADL Overall ADL's : Needs assistance/impaired Eating/Feeding: Set up;Supervision/ safety;Sitting   Grooming: Sitting;Set up;Wash/dry face;Wash/dry hands   Upper Body Bathing: Set up;Sitting;Supervision/ safety   Lower Body Bathing: Moderate assistance;Sit to/from stand   Upper Body Dressing : Minimal assistance;Sitting   Lower Body Dressing: Sit to/from stand;Moderate assistance Lower Body Dressing Details (indicate cue type and reason): Patient able to donn socks supine in bed. Needs assistance to pull up clothing - due to needing to hold onto walker Toilet Transfer: Minimal assistance;Stand-pivot;Cueing for safety;Cueing for sequencing;BSC   Toileting- Clothing Manipulation and Hygiene: Maximal assistance;Sit to/from stand;Cueing for safety  Functional mobility during ADLs: Minimal assistance;Rolling walker;Cueing for safety       Vision   Vision  Assessment?: No apparent visual deficits     Perception     Praxis      Pertinent Vitals/Pain Pain Assessment: Faces Faces Pain Scale: No hurt     Hand Dominance Right   Extremity/Trunk Assessment Upper Extremity Assessment Upper Extremity Assessment: Generalized weakness   Lower Extremity Assessment Lower Extremity Assessment: Defer to PT evaluation   Cervical / Trunk Assessment Cervical / Trunk Assessment: Kyphotic   Communication Communication Communication: HOH   Cognition Arousal/Alertness: Awake/alert Behavior During Therapy: Flat affect Overall Cognitive Status: Impaired/Different from baseline Area of Impairment: Orientation                 Orientation Level: Place;Time;Disoriented to Current Attention Level: Sustained Memory: Decreased short-term memory             General Comments       Exercises     Shoulder Instructions      Home Living Family/patient expects to be discharged to:: Private residence Living Arrangements: Spouse/significant other Available Help at Discharge: Family Type of Home: House       Home Layout: One level     Bathroom Shower/Tub: Teacher, early years/pre: Standard Bathroom Accessibility: Yes How Accessible: Accessible via walker Home Equipment: Walker - 2 wheels;Bedside commode;Toilet riser          Prior Functioning/Environment Level of Independence: Independent        Comments: retired from working with sheet metal; enjoys fishing/hunting and playing golf        OT Problem List: Decreased strength;Decreased range of motion;Decreased activity tolerance;Impaired balance (sitting and/or standing);Decreased coordination;Decreased cognition;Decreased safety awareness;Decreased knowledge of use of DME or AE;Cardiopulmonary status limiting activity;Pain;Increased edema      OT Treatment/Interventions: Self-care/ADL training;Therapeutic exercise;Neuromuscular education;Energy conservation;DME  and/or AE instruction;Therapeutic activities;Cognitive remediation/compensation;Patient/family education;Balance training    OT Goals(Current goals can be found in the care plan section) Acute Rehab OT Goals Patient Stated Goal: improve strength and independence OT Goal Formulation: With patient/family Time For Goal Achievement: 08/24/20 Potential to Achieve Goals: Good  OT Frequency: Min 2X/week   Barriers to D/C:            Co-evaluation              AM-PAC OT "6 Clicks" Daily Activity     Outcome Measure Help from another person eating meals?: A Little Help from another person taking care of personal grooming?: A Little Help from another person toileting, which includes using toliet, bedpan, or urinal?: A Lot Help from another person bathing (including washing, rinsing, drying)?: A Lot Help from another person to put on and taking off regular upper body clothing?: A Lot Help from another person to put on and taking off regular lower body clothing?: A Lot 6 Click Score: 14   End of Session Equipment Utilized During Treatment: Gait belt;Rolling walker Nurse Communication: Mobility status  Activity Tolerance: Patient limited by fatigue Patient left: with call bell/phone within reach;with family/visitor present;in chair  OT Visit Diagnosis: Unsteadiness on feet (R26.81);Other abnormalities of gait and mobility (R26.89);Muscle weakness (generalized) (M62.81);Other symptoms and signs involving cognitive function                Time: 8527-7824 OT Time Calculation (min): 26 min Charges:  OT General Charges $OT Visit: 1 Visit OT Evaluation $OT Eval Moderate Complexity: 1 Mod  Rishik Tubby, OTR/L Acute Care Rehab Services  Office [phone] Pager: [place] 07/06/2020, 1:02 PM

## 2020-07-06 NOTE — Progress Notes (Addendum)
Progress Note  Patient Name: Tony Marshall Date of Encounter: 07/06/2020  Primary Cardiologist: Skeet Latch, MD  Subjective   No CP or SOB. Occasional cough reported.  Inpatient Medications    Scheduled Meds:  sodium chloride   Intravenous Once   sodium chloride   Intravenous Once   amiodarone  200 mg Oral BID   Followed by   Derrill Memo ON 07/09/2020] amiodarone  200 mg Oral Daily   barrier cream  1 application Topical BID   carvedilol  12.5 mg Oral BID WC   chlorhexidine  15 mL Mouth Rinse BID   Chlorhexidine Gluconate Cloth  6 each Topical Daily   divalproex  125 mg Oral Q12H   feeding supplement (KATE FARMS STANDARD 1.4)  325 mL Oral BID BM   hydrocortisone cream   Topical TID   insulin aspart  0-5 Units Subcutaneous QHS   insulin aspart  0-9 Units Subcutaneous TID WC   insulin glargine  6 Units Subcutaneous QHS   magnesium oxide  400 mg Oral Daily   mouth rinse  15 mL Mouth Rinse q12n4p   multivitamin with minerals  1 tablet Oral Daily   polyethylene glycol  17 g Oral BID   potassium chloride  40 mEq Oral BID   psyllium  1 packet Oral Daily   rosuvastatin  5 mg Oral Daily   sacubitril-valsartan  1 tablet Oral BID   sodium chloride flush  10-40 mL Intracatheter Q12H   Continuous Infusions:  lactated ringers 10 mL/hr at 07/05/20 1326   PRN Meds: acetaminophen **OR** acetaminophen, bisacodyl, liver oil-zinc oxide, sodium chloride flush, traMADol   Vital Signs    Vitals:   07/06/20 0500 07/06/20 0616 07/06/20 0800 07/06/20 0856  BP: (!) 145/79  (!) 151/84 (!) 151/67  Pulse: 67  61 (!) 59  Resp: 13  14 14   Temp: 98.5 F (36.9 C)   98.6 F (37 C)  TempSrc: Oral   Oral  SpO2: 98%  95% 95%  Weight:  56.7 kg    Height:        Intake/Output Summary (Last 24 hours) at 07/06/2020 1020 Last data filed at 07/06/2020 0500 Gross per 24 hour  Intake 440 ml  Output 1500 ml  Net -1060 ml   Last 3 Weights 07/06/2020 07/05/2020 07/04/2020    Weight (lbs) 125 lb 136 lb 0.4 oz 136 lb 0.4 oz  Weight (kg) 56.7 kg 61.7 kg 61.7 kg     Telemetry    NSR, prolonged QT? 500-555ms - Personally Reviewed  Physical Exam   GEN: No acute distress.  HEENT: Normocephalic, atraumatic, sclera non-icteric. Neck: No JVD or bruits. Cardiac: RRR no murmurs, rubs, or gallops.  Radials/DP/PT 1+ and equal bilaterally.  Respiratory: Clear to auscultation bilaterally. Breathing is unlabored. GI: Soft, nontender, non-distended, BS +x 4. MS: no deformity. Extremities: No clubbing or cyanosis. No edema. Distal pedal pulses are 2+ and equal bilaterally. Neuro:  AAOx3. Follows commands. Psych:  Responds to questions appropriately with a normal, reserved affect.  Labs    Chemistry Recent Labs  Lab 07/03/20 0500 07/03/20 0500 07/04/20 0707 07/05/20 0428 07/06/20 0501  NA 136   < > 133* 131* 135  K 3.2*   < > 3.2* 3.5 2.9*  CL 100   < > 96* 95* 95*  CO2 29   < > 28 28 32  GLUCOSE 159*   < > 173* 182* 169*  BUN 8   < > 11 12 13  CREATININE 0.46*   < > 0.48* 0.46* 0.56*  CALCIUM 7.2*   < > 7.8* 7.9* 7.9*  PROT 3.5*  --   --   --   --   ALBUMIN 1.7*  --   --   --   --   AST 17  --   --   --   --   ALT 9  --   --   --   --   ALKPHOS 64  --   --   --   --   BILITOT 0.6  --   --   --   --   GFRNONAA >60   < > >60 >60 >60  GFRAA >60   < > >60 >60 >60  ANIONGAP 7   < > 9 8 8    < > = values in this interval not displayed.     Hematology Recent Labs  Lab 07/05/20 1124 07/05/20 1729 07/06/20 0501  WBC 2.4* 3.2* 2.5*  RBC 3.15* 3.39* 2.79*  HGB 9.6* 10.1* 8.4*  HCT 29.0* 30.8* 25.7*  MCV 92.1 90.9 92.1  MCH 30.5 29.8 30.1  MCHC 33.1 32.8 32.7  RDW 17.8* 17.4* 17.3*  PLT 110* 116* 106*    BNPNo results for input(s): BNP, PROBNP in the last 168 hours.   DDimer No results for input(s): DDIMER in the last 168 hours.   Radiology    VAS [place] UPPER EXTREMITY VENOUS DUPLEX  Result Date: 07/04/2020 UPPER VENOUS STUDY  Indications:  Edema Risk Factors: None identified. Comparison Study: No prior studies. Performing Technologist: Oliver Hum RVT  Examination Guidelines: A complete evaluation includes B-mode imaging, spectral Doppler, color Doppler, and power Doppler as needed of all accessible portions of each vessel. Bilateral testing is considered an integral part of a complete examination. Limited examinations for reoccurring indications may be performed as noted.  Right Findings: +----------+------------+---------+-----------+----------+-----------------+  RIGHT      Compressible Phasicity Spontaneous Properties      Summary       +----------+------------+---------+-----------+----------+-----------------+  IJV            Full        Yes        Yes                                   +----------+------------+---------+-----------+----------+-----------------+  Subclavian   Partial       No         No                 Age Indeterminate  +----------+------------+---------+-----------+----------+-----------------+  Axillary       Full        Yes        Yes                                   +----------+------------+---------+-----------+----------+-----------------+  Brachial       Full        Yes        Yes                                   +----------+------------+---------+-----------+----------+-----------------+  Radial         Full                                                         +----------+------------+---------+-----------+----------+-----------------+  Ulnar          Full                                                         +----------+------------+---------+-----------+----------+-----------------+  Cephalic       Full                                                         +----------+------------+---------+-----------+----------+-----------------+  Basilic        Full                                                         +----------+------------+---------+-----------+----------+-----------------+  Left Findings:  +----------+------------+---------+-----------+----------+-------+  LEFT       Compressible Phasicity Spontaneous Properties Summary  +----------+------------+---------+-----------+----------+-------+  Subclavian     Full        Yes        Yes                         +----------+------------+---------+-----------+----------+-------+  Summary:  Right: No evidence of superficial vein thrombosis in the upper extremity. 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  Left: No evidence of thrombosis in the subclavian.  *See table(s) above for measurements and observations.  Diagnosing physician: Servando Snare MD Electronically signed by Servando Snare MD on 07/04/2020 at 5:29:09 PM.    Final     Cardiac Studies   Echo 06/07/20 1. Left ventricular ejection fraction, by estimation, is 25 to 30%. The  left ventricle has severely decreased function. The left ventricle has no  regional wall motion abnormalities. Left ventricular diastolic parameters  are consistent with Grade II  diastolic dysfunction (pseudonormalization).  2. Right ventricular systolic function is normal. The right ventricular  size is normal. There is normal pulmonary artery systolic pressure.  3. The mitral valve is normal in structure. No evidence of mitral valve  regurgitation. No evidence of mitral stenosis.  4. The aortic valve is grossly normal. Aortic valve regurgitation is not  visualized. No aortic stenosis is present.  5. The inferior vena cava is normal in size with greater than 50%  respiratory variability, suggesting right atrial pressure of 3 mmHg.  Patient Profile     79 y.o.malewith history ofCLL, DM, OSA, HTN, HLD,prior habitual alcohol use,GERD, Schatzki's ring, prolonged QTc, recently diagnosed cardiomyopathy in the context of acute illness. Tumultuous course recently related to CLL, initiation of chemotherapy, dehydration, weight loss, constipation and  subsequent diarrhea after laxatives. He wasrecentlyadmitted6/21/2021with confusion, weakness, and fatigue, felt to have septic shock in setting of RLL cellulitis.He was noted to be hyponatremic, hypokalemic, hypomagnesemic, febrile, in metabolic acidosis with anion gap and lactic acidosis, with AKI, decreased TSH, toxic metabolic encephalopathy,prolonged QT,and chemo-induced neutropenia. Co-ox was relatively normal, arguing against cardiogenic etiology of shock.2D echo was obtained which showed severe LV dysfunction with EF 25-30%, grade 2 DD, normal RV, therefore cardiology consulted- plan to re-eval as  outpatient for [facility] when more medically stable. He was readmitted7/14/2021with diarrhea and maroon colored stools with clots, found to have GIB and DKA. Felt to have possiblefecal impaction with stercoral ulcer/hemorrhoids, being treated by GI. Hospital course complicated byextreme agitation/delirium where patient was combative/hitting staff members.Cardiology following for new onset atrial fibrillation with RVR, treated with amiodarone. Not a candidate for Brea due to GI bleeding. Also right subclavian DVT, felt related to PICC that was in place from last hospitalization (unable to anticoagulate due to GIB).  Assessment & Plan    1. Acute GI bleed with ABL anemia in the context of above medical illness with DKA, recent treatment for CLL.Felt possibly from stercoral colitis with fecal impaction. Hgb nadir 6. - endoscopy yesterday with GI requiring manual disimpaction, nonbleeding external hemorrhoids, single ulcer in the rectum c/w stercoral ulcer, the source of recent bleeding, not actively bleeding -> recommended to avoid anticoagulation as ulcer is at high risk for rebleeding - Per IM/GI  2. Atrial fibrillation with RVR, occurring newly in the metabolic stressor context of DKA, hypokalemia (K 2.9) and GI bleed. He was placed on IV amiodarone with conversion to NSR. CHADSVASC 6 for CHF,  HTN, age x2, DM, vascular disease (coronary calcification on CT).  - not a candidate for anticoagulation due to GIB - avoid CCBs given LV dysfunction  - maintaining NSR therefore continue oral amiodarone at 200mg  BID through 7/24, then begin 200mg  daily on 07/09/20 - continue BB  3. Recently abnormal thyroid function - TSH was 0.200 during last hospital stay, but with normal free T4. Recheck TSH wnl but free T4 marginally elevated. - recommend OP monitoring  4. Chronic combined CHF/cardiomyopathy - diagnosed last admission, etiology not yet known. R/LHC was to be pursued when medically stable. Still on deferral for now given acute issues, anticipate revisiting once stable as outpatient. Continue medical therapy - volume status appears stable - continue carvedilol and Entresto at present doses, can titrate Entresto further as OP  5. Essential HTN - initially hypotensive, then regained BP with clinical improvement. Managed in context of above  6. Prolonged QTc- this was a problem last admission as well.  - repeat QTC 454ms on telemetry  - avoid QT prolonging agents - seems stable on amiodarone, but would avoid outpatient meds that have this potential (I.e. loperamide, tramadol) - recheck EKG today as QT triggering for prolonged on telemetry - appears 461ms on bedside tele - optimize lytes as below  7. Electrolyte abnormalities that include hypokalemia, hypomagnesemia, hypocalcemia - electrolyte managemenet per primary team, recommend keeping K 4.0 or greater and Mg 2.0 or greater - needs aggressive repletion of potassium  Aside from optimization of electrolytes, no new recs. Have tentatively scheduled f/u 07/25/20 with Dr. Oval Linsey. Patient should optimally see PCP/medical team within 1 week of dc.  For questions or updates, please contact [place] Please consult [email or web address] for contact info under Cardiology/STEMI.  Signed, Charlie Pitter, PA-C 07/06/2020, 10:20 AM    Patient  seen and examined with Melina Copa PA-C.  Agree as above, with the following exceptions and changes as noted below. Feels ok today, no heart failure symptoms. Gen: NAD, CV: RRR, no murmurs, Lungs: clear, Abd: soft, Extrem: Warm, well perfused, no edema, Neuro/Psych: alert and oriented x 3, normal mood and affect. All available labs, radiology testing, previous records reviewed. Tolerating increased dose of Entresto well. No further titration required inpatient. Continue amiodarone, maintaining SR.   F/u has been arranged. Cardiology will sign off at this  time.   Elouise Munroe, MD 07/06/20 4:56 PM

## 2020-07-06 NOTE — Plan of Care (Signed)
  Problem: Education: Goal: Ability to describe self-care measures that may prevent or decrease complications (Diabetes Survival Skills Education) will improve Outcome: Progressing Goal: Individualized Educational Video(s) Outcome: Progressing   Problem: Cardiac: Goal: Ability to maintain an adequate cardiac output will improve Outcome: Progressing   Problem: Health Behavior/Discharge Planning: Goal: Ability to identify and utilize available resources and services will improve Outcome: Progressing Goal: Ability to manage health-related needs will improve Outcome: Progressing   Problem: Fluid Volume: Goal: Ability to achieve a balanced intake and output will improve Outcome: Progressing   Problem: Metabolic: Goal: Ability to maintain appropriate glucose levels will improve Outcome: Progressing   Problem: Nutritional: Goal: Maintenance of adequate nutrition will improve Outcome: Progressing Goal: Maintenance of adequate weight for body size and type will improve Outcome: Progressing   Problem: Respiratory: Goal: Will regain and/or maintain adequate ventilation Outcome: Progressing   Problem: Urinary Elimination: Goal: Ability to achieve and maintain adequate renal perfusion and functioning will improve Outcome: Progressing   Problem: Health Behavior/Discharge Planning: Goal: Ability to manage health-related needs will improve Outcome: Progressing   Problem: Clinical Measurements: Goal: Ability to maintain clinical measurements within normal limits will improve Outcome: Progressing Goal: Will remain free from infection Outcome: Progressing Goal: Diagnostic test results will improve Outcome: Progressing Goal: Respiratory complications will improve Outcome: Progressing Goal: Cardiovascular complication will be avoided Outcome: Progressing   Problem: Activity: Goal: Risk for activity intolerance will decrease Outcome: Progressing   Problem: Nutrition: Goal:  Adequate nutrition will be maintained Outcome: Progressing   Problem: Coping: Goal: Level of anxiety will decrease Outcome: Progressing   Problem: Elimination: Goal: Will not experience complications related to bowel motility Outcome: Progressing Goal: Will not experience complications related to urinary retention Outcome: Progressing   Problem: Pain Managment: Goal: General experience of comfort will improve Outcome: Progressing   Problem: Safety: Goal: Ability to remain free from injury will improve Outcome: Progressing   Problem: Skin Integrity: Goal: Risk for impaired skin integrity will decrease Outcome: Progressing

## 2020-07-07 ENCOUNTER — Inpatient Hospital Stay (HOSPITAL_COMMUNITY): Payer: Medicare HMO

## 2020-07-07 DIAGNOSIS — I480 Paroxysmal atrial fibrillation: Secondary | ICD-10-CM | POA: Diagnosis not present

## 2020-07-07 DIAGNOSIS — C9110 Chronic lymphocytic leukemia of B-cell type not having achieved remission: Secondary | ICD-10-CM | POA: Diagnosis not present

## 2020-07-07 DIAGNOSIS — E1110 Type 2 diabetes mellitus with ketoacidosis without coma: Secondary | ICD-10-CM | POA: Diagnosis not present

## 2020-07-07 DIAGNOSIS — Z789 Other specified health status: Secondary | ICD-10-CM | POA: Diagnosis not present

## 2020-07-07 LAB — BASIC METABOLIC PANEL
Anion gap: 8 (ref 5–15)
BUN: 12 mg/dL (ref 8–23)
CO2: 32 mmol/L (ref 22–32)
Calcium: 7.8 mg/dL — ABNORMAL LOW (ref 8.9–10.3)
Chloride: 95 mmol/L — ABNORMAL LOW (ref 98–111)
Creatinine, Ser: 0.52 mg/dL — ABNORMAL LOW (ref 0.61–1.24)
GFR calc Af Amer: >60 mL/min (ref >60)
GFR calc non Af Amer: >60 mL/min (ref >60)
Glucose, Bld: 128 mg/dL — ABNORMAL HIGH (ref 70–99)
Potassium: 3.1 mmol/L — ABNORMAL LOW (ref 3.5–5.1)
Sodium: 135 mmol/L (ref 135–145)

## 2020-07-07 LAB — CBC
HCT: 26.3 % — ABNORMAL LOW (ref 39.0–52.0)
Hemoglobin: 8.6 g/dL — ABNORMAL LOW (ref 13.0–17.0)
MCH: 30.3 pg (ref 26.0–34.0)
MCHC: 32.7 g/dL (ref 30.0–36.0)
MCV: 92.6 fL (ref 80.0–100.0)
Platelets: 97 10*3/uL — ABNORMAL LOW (ref 150–400)
RBC: 2.84 MIL/uL — ABNORMAL LOW (ref 4.22–5.81)
RDW: 17.2 % — ABNORMAL HIGH (ref 11.5–15.5)
WBC: 1.9 10*3/uL — ABNORMAL LOW (ref 4.0–10.5)
nRBC: 0 % (ref 0.0–0.2)

## 2020-07-07 LAB — DIFFERENTIAL
Abs Immature Granulocytes: 0.11 10*3/uL — ABNORMAL HIGH (ref 0.00–0.07)
Basophils Absolute: 0 10*3/uL (ref 0.0–0.1)
Basophils Relative: 1 %
Eosinophils Absolute: 0 10*3/uL (ref 0.0–0.5)
Eosinophils Relative: 1 %
Immature Granulocytes: 6 %
Lymphocytes Relative: 25 %
Lymphs Abs: 0.4 10*3/uL — ABNORMAL LOW (ref 0.7–4.0)
Monocytes Absolute: 0.1 10*3/uL (ref 0.1–1.0)
Monocytes Relative: 7 %
Neutro Abs: 1.1 10*3/uL — ABNORMAL LOW (ref 1.7–7.7)
Neutrophils Relative %: 60 %

## 2020-07-07 LAB — GLUCOSE, CAPILLARY
Glucose-Capillary: 80 mg/dL (ref 70–99)
Glucose-Capillary: 230 mg/dL — ABNORMAL HIGH (ref 70–99)
Glucose-Capillary: 212 mg/dL — ABNORMAL HIGH (ref 70–99)
Glucose-Capillary: 249 mg/dL — ABNORMAL HIGH (ref 70–99)

## 2020-07-07 LAB — MAGNESIUM: Magnesium: 1.7 mg/dL (ref 1.7–2.4)

## 2020-07-07 MED ORDER — ALPRAZOLAM 0.25 MG PO TABS
0.1250 mg | ORAL_TABLET | Freq: Two times a day (BID) | ORAL | Status: DC | PRN
  Administered 2020-07-07 – 2020-07-08 (×2): 0.125 mg via ORAL
  Filled 2020-07-07 (×2): qty 1

## 2020-07-07 MED ORDER — KATE FARMS STANDARD 1.4 PO LIQD
325.0000 mL | Freq: Three times a day (TID) | ORAL | Status: DC
  Filled 2020-07-07 (×2): qty 325

## 2020-07-07 MED ORDER — POLYETHYLENE GLYCOL 3350 17 G PO PACK
17.0000 g | PACK | Freq: Every day | ORAL | Status: DC
  Administered 2020-07-08 – 2020-07-11 (×4): 17 g via ORAL
  Filled 2020-07-07 (×4): qty 1

## 2020-07-07 MED ORDER — ENSURE ENLIVE PO LIQD
237.0000 mL | Freq: Three times a day (TID) | ORAL | Status: DC
  Administered 2020-07-07 – 2020-07-11 (×10): 237 mL via ORAL

## 2020-07-07 NOTE — Plan of Care (Signed)
  Problem: Education: Goal: Ability to describe self-care measures that may prevent or decrease complications (Diabetes Survival Skills Education) will improve Outcome: Progressing   Problem: Cardiac: Goal: Ability to maintain an adequate cardiac output will improve Outcome: Progressing   

## 2020-07-07 NOTE — Progress Notes (Signed)
Physical Therapy Treatment Patient Details Name: Tony Marshall MRN: [id number] DOB: 1941-02-16 Today's Date: 07/07/2020    History of Present Illness Tony Marshall is a 79 y.o. male with a history of CLL, chronic combined CHF (Dx June 2021), T2DM, HTN, HLD, diverticulosis who presented to the ED 7/14 with hematochezia found to be in DKA and admitted to ICU on insulin infusion with resolution of DKA. Atrial fibrillation with RVR developed for which cardiology was consulted, started amiodarone IV. The hematochezia was thought to be due to fecal impaction with stercoral ulcer and resolved with conservative treatment including transfusions. Bowel regimen was started. Due to arm swelling, U/S was performed demonstrating an age-indeterminate subclavian DVT. Heparin was started without bolus with subsequent recurrence of GI bleeding. Heparin was stopped and GI performed flexible sigmoidoscopy limited by significant stool burden/fecal impaction. Incomplete evacuation was performed 7/21.  He also had protracted hospitalization recently from involving septic shock possibly due to RLL cellulitis initially requiring pressors, improved on ancef.    PT Comments    Pt limited to bed only due to continuous leaking of BMs especially with coughing episodes.    Follow Up Recommendations  Home health PT;Supervision/Assistance - 24 hour     Equipment Recommendations  Wheelchair (measurements PT)    Recommendations for Other Services       Precautions / Restrictions Precautions Precautions: Fall Precaution Comments: monitor HR    Mobility  Bed Mobility Overal bed mobility: Needs Assistance Bed Mobility: Rolling Rolling: Supervision         General bed mobility comments: multiple rolling as pt with continued stools, pt able bridge twice with cues; deferred further movement as pt continues to have continuous stools at this time  Transfers                    Ambulation/Gait                  Stairs             Wheelchair Mobility    Modified Rankin (Stroke Patients Only)       Balance                                            Cognition Arousal/Alertness: Awake/alert Behavior During Therapy: Flat affect Overall Cognitive Status: Within Functional Limits for tasks assessed                                        Exercises      General Comments        Pertinent Vitals/Pain Pain Assessment: No/denies pain    Home Living                      Prior Function            PT Goals (current goals can now be found in the care plan section) Progress towards PT goals: Progressing toward goals    Frequency    Min 3X/week      PT Plan Current plan remains appropriate    Co-evaluation              AM-PAC PT "6 Clicks" Mobility   Outcome Measure  Help needed turning from your back to your side while  in a flat bed without using bedrails?: A Little Help needed moving from lying on your back to sitting on the side of a flat bed without using bedrails?: A Little Help needed moving to and from a bed to a chair (including a wheelchair)?: A Little Help needed standing up from a chair using your arms (e.g., wheelchair or bedside chair)?: A Little Help needed to walk in hospital room?: A Lot Help needed climbing 3-5 steps with a railing? : A Lot 6 Click Score: 16    End of Session   Activity Tolerance: Other (comment) (limited due to BMs) Patient left: in bed;with call bell/phone within reach   PT Visit Diagnosis: Other abnormalities of gait and mobility (R26.89);Pain;Other symptoms and signs involving the nervous system (R29.898)     Time: 8833-7445 PT Time Calculation (min) (ACUTE ONLY): 15 min  Charges:  $Therapeutic Activity: 8-22 mins           Jannette Spanner PT, DPT Acute Rehabilitation Services Pager: [phone] Office: [phone]            York Ram E 07/07/2020, 1:35  PM

## 2020-07-07 NOTE — Progress Notes (Signed)
PROGRESS NOTE  Tony Marshall  Tony Marshall DOB: June 25, 1941 DOA: 06/28/2020 PCP: Elayne Snare, MD   Brief Narrative: Tony Marshall is a 79 y.o. male with a history of CLL, chronic combined CHF (Dx June 2021), T2DM, HTN, HLD, diverticulosis who presented to the ED 7/14 with hematochezia found to be in DKA and admitted to ICU on insulin infusion with resolution of DKA. Atrial fibrillation with RVR developed for which cardiology was consulted, started amiodarone IV. The hematochezia was thought to be due to fecal impaction with stercoral ulcer and resolved with conservative treatment including transfusions. Bowel regimen was started. Due to arm swelling, U/S was performed demonstrating an age-indeterminate subclavian DVT. Heparin was started without bolus with subsequent recurrence of GI bleeding. Heparin was stopped and GI performed flexible sigmoidoscopy limited by significant stool burden/fecal impaction. Incomplete evacuation was performed 7/21.Bowel program has been augmented with significant liquid stool output. Repeat DRE 7/23 did not reveal any palpable stool and XR did not demonstrate significant stool burden or impaction.   He also had protracted hospitalization involving septic shock possibly due to RLL cellulitis initially requiring pressors, improved on ancef.  Assessment & Plan: Active Problems:   Essential hypertension   Pure hypercholesterolemia   CLL (chronic lymphocytic leukemia) (HCC)   Increased anion gap metabolic acidosis   Symptomatic anemia   Hyperglycemia due to diabetes mellitus (HCC)   DKA (diabetic ketoacidoses) (HCC)   Systolic and diastolic CHF, chronic (HCC)   Fecal impaction (HCC)   Overflow diarrhea   Rectal bleeding   Lower GI bleed   Central venous catheter in place   Shock circulatory (HCC)   Prolonged QT interval   Atrial fibrillation (HCC)   Stercoral ulcer of rectum  Acute blood loss anemia due to GI bleeding due to stercoral ulcer due to fecal  impaction: DRE this AM without stool burden and XR personally reviewed demonstrated no upstream dilatation, no visible impaction or significant stool burden at all.  - s/p 2u PRBCs 7/17. Hgb down 10.1 > 8.4g/dl with scant bleeding still noted. Will recheck in PM with goal hgb 8g/dl or higher.  - Stercoral ulcer to be treated with carafate enema. - Holding heparin. GI believes continued risk of hemorrhage is very high while ulcer is present.  - Prevent constipation/re-impaction with daily miralax (decrease to once daily and hold psyllium for now). Has had manual disimpaction at time of sigmoidoscopy and no evidence of impaction on subsequent DRE or XR.   Cough: No abnormalities on exam, no hypoxia, no hemoptysis. No fever.  - Sputum gram stain polymicrobial, culture pending, if respiratory complaints noted would consider CXR.  - Strongly suspect atelectasis. Incentive spirometry encouraged, ordered.   Age-indeterminate right subclavian DVT: Likely related to PICC that was in place during previous hospitalization (6/21 - 7/6). - Unable to anticoagulate at this time. Discussed risks and benefits w/family.   DKA in T2DM: Resolved s/p IV insulin. HbA1c 7.7% last month. DM followed by Dr. Dwyane Dee, Dx 1992. - Continue lantus 6u + SSI. At inpatient goal - Diabetes coordinator consulted   New onset atrial fibrillation with RVR:  - Continue telemetry, keep K, Mg replete. TSH 3.521.  - Continue amiodarone with planned taper 200mg  po BID > 200mg  qd on 7/25. - Continue coreg. Avoid CCB's w/LV systolic dysfunction. - [id number] score is 6 (CHF, HTN, age x2, T2DM, vascular calcifications by imaging), though holding anticoagulation in setting of GI bleed as above.    Chronic combined HFrEF: Roughly euvolemic on exam, though  I/O shows net positive. Echo 06/07/2020 w/LVEF 25-30%, G2DD, RV appears normal in structure and function.  - Continue entresto (newly started), dose per cardiology. Cr, BP stable.  - No  current need for diuretic.  - RHC/LHC was planned once medically stabilized following recent hospitalization for septic shock.  CLL: Dx Oct 2014, Tx initiated 04/04/2020 with bendamustine/rituximab now s/p 3 cycles (last 6/15) and plan seems to be for observation. Tolerates chemo poorly w/fatigue, weakness, weight loss, poor appetite, etc.  - Pancytopenia possibly related to chemotherapy. Discussed with Mikey Bussing, NP for oncology who spoke with Dr. Julien Nordmann. ANC this AM down to 1.1 in the absence of infection. No intervention currently planned.   Severe protein calorie malnutrition: Continues to have very minimal per oral intake. No related to nausea, vomiting, abdominal pain. Not related to any diet restrictions (there are none). It has persisted since chemotherapy.  - Supplement protein, unrestrict diet. Offered cortrak and tube feeding though the patient would like to defer for now and is tolerating ensures. Could consider tube feeding, though I wonder if that will correct the underlying problem. This was discussed with the patient and wife today who will continue consideration and we will discuss again 7/23.   OSA:  - CPAP qHS encouraged  Diffuse atherosclerosis of coronary arteries noted on imaging: No chest pain.  - On beta blocker, statin. No aspirin or anticoagulation at this time.   Prolonged QT interval: Stable, chronic.  - Minimize provocative agents.   Acute metabolic encephalopathy: Improved.  - Continue depakote and delirium precautions. Family at bedside appears to be helpful.   HTN:  - Cardiac medications as above  HLD:  - Continue statin  Hypokalemia: Improved.   DVT prophylaxis: SCDs Code Status: Full Family Communication: Wife at bedside Disposition Plan:  Status is: Inpatient  Remains inpatient appropriate because:Inpatient level of care appropriate due to severity of illness.    Dispo: The patient is from: Home              Anticipated d/c is to: Home  with home health.              Anticipated d/c date is: 1-2 days              Patient currently is not medically stable to d/c.  Consultants:   GI  Cardiology  Procedures:   Flexible sigmoidoscopy 07/05/2020 Dr. Tarri Glenn:  Findings:      Non-bleeding external hhemorrhoids were found on perianal exam.      A decubitus ulcer was also identified on perianal exam.      A single (solitary) ulcer was found in the rectum. No active bleeding       was present.      A large amount of solid stool was found in the rectum consistent with       fecal impaction, precluding any meaningful visualization. I manually       disimpacted the large rectal stool ball. Too much stool remained for any       additional meaningful evaluation of the colonic mucosa. Impression:       - Preparation of the colon was inadequate due to                            fecal impaction. Manual disimpaction performed  during the procedure.                           - Non-bleeding external hemorrhoids found on                            perianal exam.                           - Decubitus ulcer identified on perianal exam.                           - A single (solitary) ulcer in the rectum                            consistent with stercoral ulcer - the source of                            recent bleeding. Not actively bleeding.                           - No specimens collected. Moderate Sedation:      Not Applicable - Patient had care per Anesthesia. Recommendation:           - Return patient to hospital ward for ongoing care.                           - Advance diet as tolerated.                           - Aggressive bowel regimen to prevent recurrence of                            fecal impaction including Miralax 17 g BID,                            psyllium daily, and drinking at least 64 ounces of                            water daily.                           - Consider Carafate  enemas 2 gm in 20 ml water BID                            x 6 weeks.                           - Avoid anticoagulation as able as this ulcer is at                            high risk for rebleeding.                           - I alerted the hospitalist team to the finding of  the decubitus ulcer.                           I discussed these findings and recommendations with                            the patient in the endoscopy recovery area and with                            his wife by telephone.                           Please call the on-call gastroenterologist with any                            additional questions or concerns during this                            hospitalization.  Antimicrobials:  None   Subjective: Stool frequency has decreased. Starting to eat more, especifically ensure/supplements, took 1-2 over past 24 hrs. Continues to have no abdominal pain, N/V. No bleeding noted.  Objective: Vitals:   07/06/20 0856 07/06/20 2032 07/07/20 0542 07/07/20 0855  BP: (!) 151/67 (!) 140/70 (!) 154/74 (!) 140/74  Pulse: (!) 59 67 61 63  Resp: 14 14 16    Temp: 98.6 F (37 C) 98.7 F (37.1 C) 97.7 F (36.5 C)   TempSrc: Oral Oral Oral   SpO2: 95%     Weight:   55.9 kg   Height:        Intake/Output Summary (Last 24 hours) at 07/07/2020 1313 Last data filed at 07/07/2020 0541 Gross per 24 hour  Intake 60 ml  Output 1550 ml  Net -1490 ml   Filed Weights   07/05/20 1302 07/06/20 0616 07/07/20 0542  Weight: 61.7 kg 56.7 kg 55.9 kg   Gen: 79 y.o. male in no distress Pulm: Nonlabored breathing room air. Clear. CV: Regular rate and rhythm. No murmur, rub, or gallop. No JVD, no dependent edema. GI: Abdomen soft, non-tender, non-distended, with normoactive bowel sounds.  Rectal: Nonbleeding external hemorrhoids. Small shallow ulcer seen on sigmoidoscopy noted, no erythema, barrier cream in place. No palpable stool in rectal vault. Ext: Warm,  no deformities Skin: No rashes, lesions or ulcers on visualized skin. Neuro: Alert and oriented. No focal neurological deficits. Psych: Judgement and insight appear fair. Mood euthymic & affect congruent. Behavior is appropriate.    Data Reviewed: I have personally reviewed following labs and imaging studies  CBC: Recent Labs  Lab 07/03/20 0500 07/04/20 0707 07/05/20 0747 07/05/20 0747 07/05/20 1124 07/05/20 1729 07/06/20 0501 07/06/20 1605 07/07/20 0429  WBC 2.4*   < > 2.9*  --  2.4* 3.2* 2.5*  --  1.9*  NEUTROABS 1.6*  --   --   --   --   --   --   --  1.1*  HGB 9.3*   < > 9.5*   < > 9.6* 10.1* 8.4* 9.9* 8.6*  HCT 28.8*   < > 29.0*   < > 29.0* 30.8* 25.7* 30.7* 26.3*  MCV 92.6   < > 92.4  --  92.1 90.9 92.1  --  92.6  PLT PLATELET CLUMPS NOTED ON SMEAR, UNABLE TO  ESTIMATE   < > 118*  --  110* 116* 106*  --  97*   < > = values in this interval not displayed.   Basic Metabolic Panel: Recent Labs  Lab 07/01/20 1920 07/01/20 1920 07/02/20 0430 07/02/20 0430 07/03/20 0500 07/04/20 0707 07/05/20 0428 07/06/20 0501 07/07/20 0429  NA 133*   < > 135   < > 136 133* 131* 135 135  K 3.0*   < > 3.6   < > 3.2* 3.2* 3.5 2.9* 3.1*  CL 103   < > 101   < > 100 96* 95* 95* 95*  CO2 25   < > 25   < > 29 28 28  32 32  GLUCOSE 196*   < > 212*   < > 159* 173* 182* 169* 128*  BUN 9   < > 9   < > 8 11 12 13 12   CREATININE 0.60*   < > 0.49*   < > 0.46* 0.48* 0.46* 0.56* 0.52*  CALCIUM 7.5*   < > 7.7*   < > 7.2* 7.8* 7.9* 7.9* 7.8*  MG 1.5*  --  1.7  --   --  1.7 1.7  --  1.7   < > = values in this interval not displayed.   GFR: Estimated Creatinine Clearance: 62.1 mL/min (A) (by C-G formula based on SCr of 0.52 mg/dL (L)). Liver Function Tests: Recent Labs  Lab 07/03/20 0500  AST 17  ALT 9  ALKPHOS 64  BILITOT 0.6  PROT 3.5*  ALBUMIN 1.7*   No results for input(s): LIPASE, AMYLASE in the last 168 hours. No results for input(s): AMMONIA in the last 168 hours. Coagulation  Profile: No results for input(s): INR, PROTIME in the last 168 hours. Cardiac Enzymes: No results for input(s): CKTOTAL, CKMB, CKMBINDEX, TROPONINI in the last 168 hours. BNP (last 3 results) No results for input(s): PROBNP in the last 8760 hours. HbA1C: No results for input(s): HGBA1C in the last 72 hours. CBG: Recent Labs  Lab 07/06/20 1147 07/06/20 1719 07/06/20 2030 07/07/20 0806 07/07/20 1245  GLUCAP 123* 251* 165* 80 230*   Lipid Profile: No results for input(s): CHOL, HDL, LDLCALC, TRIG, CHOLHDL, LDLDIRECT in the last 72 hours. Thyroid Function Tests: No results for input(s): TSH, T4TOTAL, FREET4, T3FREE, THYROIDAB in the last 72 hours. Anemia Panel: No results for input(s): VITAMINB12, FOLATE, FERRITIN, TIBC, IRON, RETICCTPCT in the last 72 hours. Urine analysis:    Component Value Date/Time   COLORURINE YELLOW 06/28/2020 0758   APPEARANCEUR CLEAR 06/28/2020 0758   LABSPEC 1.018 06/28/2020 0758   PHURINE 5.0 06/28/2020 0758   GLUCOSEU >=500 (A) 06/28/2020 0758   GLUCOSEU 500 (A) 06/27/2020 1354   HGBUR NEGATIVE 06/28/2020 0758   BILIRUBINUR NEGATIVE 06/28/2020 0758   BILIRUBINUR negative 07/08/2018 1523   KETONESUR 80 (A) 06/28/2020 0758   PROTEINUR NEGATIVE 06/28/2020 0758   UROBILINOGEN 0.2 06/27/2020 1354   NITRITE NEGATIVE 06/28/2020 0758   LEUKOCYTESUR NEGATIVE 06/28/2020 0758   Recent Results (from the past 240 hour(s))  SARS Coronavirus 2 by RT PCR (hospital order, performed in [facility] hospital lab) Nasopharyngeal Nasopharyngeal Swab     Status: None   Collection Time: 06/28/20  1:38 PM   Specimen: Nasopharyngeal Swab  Result Value Ref Range Status   SARS Coronavirus 2 NEGATIVE NEGATIVE Final    Comment: (NOTE) SARS-CoV-2 target nucleic acids are NOT DETECTED.  The SARS-CoV-2 RNA is generally detectable in upper and lower respiratory specimens during the  acute phase of infection. The lowest concentration of SARS-CoV-2 viral copies this assay can  detect is 250 copies / mL. A negative result does not preclude SARS-CoV-2 infection and should not be used as the sole basis for treatment or other patient management decisions.  A negative result may occur with improper specimen collection / handling, submission of specimen other than nasopharyngeal swab, presence of viral mutation(s) within the areas targeted by this assay, and inadequate number of viral copies (<250 copies / mL). A negative result must be combined with clinical observations, patient history, and epidemiological information.  Fact Sheet for Patients:   StrictlyIdeas.no  Fact Sheet for Healthcare Providers: BankingDealers.co.za  This test is not yet approved or  cleared by the [place] FDA and has been authorized for detection and/or diagnosis of SARS-CoV-2 by FDA under an Emergency Use Authorization (EUA).  This EUA will remain in effect (meaning this test can be used) for the duration of the COVID-19 declaration under Section 564(b)(1) of the Act, 21 U.S.C. section 360bbb-3(b)(1), unless the authorization is terminated or revoked sooner.  Performed at [facility], [place] [address] [place], [place] [address]   MRSA PCR Screening     Status: None   Collection Time: 06/28/20  8:54 PM   Specimen: Nasal Mucosa; Nasopharyngeal  Result Value Ref Range Status   MRSA by PCR NEGATIVE NEGATIVE Final    Comment:        The GeneXpert MRSA Assay (FDA approved for NASAL specimens only), is one component of a comprehensive MRSA colonization surveillance program. It is not intended to diagnose MRSA infection nor to guide or monitor treatment for MRSA infections. Performed at [facility], [place] [address], [place], [place] [address]   Expectorated sputum assessment w rflx to resp cult     Status: None   Collection Time: 07/06/20  1:30 AM   Specimen: Expectorated Sputum  Result  Value Ref Range Status   Specimen Description EXPECTORATED SPUTUM  Final   Special Requests NONE  Final   Sputum evaluation   Final    THIS SPECIMEN IS ACCEPTABLE FOR SPUTUM CULTURE Performed at [facility], [place] [address], [place], [place] [address]    Report Status 07/06/2020 FINAL  Final  Culture, respiratory     Status: None (Preliminary result)   Collection Time: 07/06/20  1:30 AM  Result Value Ref Range Status   Specimen Description   Final    EXPECTORATED SPUTUM Performed at [place] [address], [place], [place] [address]    Special Requests   Final    NONE Reflexed from [id number] Performed at [facility], [place] [address] [place], [place] [address]    Gram Stain   Final    RARE WBC PRESENT, PREDOMINANTLY PMN FEW GRAM POSITIVE COCCI RARE GRAM NEGATIVE RODS Performed at [facility], [place] [address] [place], [place] [address]    Culture MODERATE GRAM NEGATIVE RODS  Final   Report Status PENDING  Incomplete      Radiology Studies: DG Abd 2 Views  Result Date: 07/07/2020 CLINICAL DATA:  Fecal impaction EXAM: ABDOMEN - 2 VIEW COMPARISON:  None FINDINGS: Normal retained stool burden. Specifically no abnormal retained stool burden seen within the rectum. Scattered gas throughout colon. Small bowel gas pattern normal.  Bones demineralized. IMPRESSION: Normal bowel gas pattern. No abnormal retained stool burden. Electronically Signed   By: Lavonia Dana M.D.   On: 07/07/2020 12:51  Scheduled Meds:  sodium chloride   Intravenous Once   sodium chloride   Intravenous Once   amiodarone  200 mg Oral BID   Followed by   Derrill Memo ON 07/09/2020] amiodarone  200 mg Oral Daily   barrier cream  1 application Topical BID   carvedilol  12.5 mg Oral BID WC   chlorhexidine  15 mL Mouth Rinse BID   Chlorhexidine Gluconate Cloth  6 each Topical Daily   divalproex  125 mg Oral Q12H   feeding supplement (KATE FARMS  STANDARD 1.4)  325 mL Oral BID BM   hydrocortisone cream   Topical TID   insulin aspart  0-5 Units Subcutaneous QHS   insulin aspart  0-9 Units Subcutaneous TID WC   insulin glargine  6 Units Subcutaneous QHS   magnesium oxide  400 mg Oral Daily   mouth rinse  15 mL Mouth Rinse q12n4p   multivitamin with minerals  1 tablet Oral Daily   polyethylene glycol  17 g Oral BID   potassium chloride  40 mEq Oral BID   psyllium  1 packet Oral Daily   rosuvastatin  5 mg Oral Daily   sacubitril-valsartan  1 tablet Oral BID   sodium chloride flush  10-40 mL Intracatheter Q12H   sucralfate  2 g Rectal BID   Continuous Infusions:  lactated ringers 10 mL/hr at 07/05/20 1326     LOS: 9 days   Time spent: 35 minutes.  Patrecia Pour, MD [facility] [email or web address] 07/07/2020, 1:13 PM

## 2020-07-08 ENCOUNTER — Inpatient Hospital Stay (HOSPITAL_COMMUNITY): Payer: Medicare HMO

## 2020-07-08 DIAGNOSIS — Z789 Other specified health status: Secondary | ICD-10-CM | POA: Diagnosis not present

## 2020-07-08 DIAGNOSIS — E1110 Type 2 diabetes mellitus with ketoacidosis without coma: Secondary | ICD-10-CM | POA: Diagnosis not present

## 2020-07-08 DIAGNOSIS — I480 Paroxysmal atrial fibrillation: Secondary | ICD-10-CM | POA: Diagnosis not present

## 2020-07-08 DIAGNOSIS — C9110 Chronic lymphocytic leukemia of B-cell type not having achieved remission: Secondary | ICD-10-CM | POA: Diagnosis not present

## 2020-07-08 LAB — CULTURE, RESPIRATORY W GRAM STAIN

## 2020-07-08 LAB — COMPREHENSIVE METABOLIC PANEL
ALT: 15 U/L (ref 0–44)
AST: 18 U/L (ref 15–41)
Albumin: 1.9 g/dL — ABNORMAL LOW (ref 3.5–5.0)
Alkaline Phosphatase: 57 U/L (ref 38–126)
Anion gap: 7 (ref 5–15)
BUN: 12 mg/dL (ref 8–23)
CO2: 32 mmol/L (ref 22–32)
Calcium: 7.8 mg/dL — ABNORMAL LOW (ref 8.9–10.3)
Chloride: 96 mmol/L — ABNORMAL LOW (ref 98–111)
Creatinine, Ser: 0.45 mg/dL — ABNORMAL LOW (ref 0.61–1.24)
GFR calc Af Amer: >60 mL/min (ref >60)
GFR calc non Af Amer: >60 mL/min (ref >60)
Glucose, Bld: 119 mg/dL — ABNORMAL HIGH (ref 70–99)
Potassium: 3.3 mmol/L — ABNORMAL LOW (ref 3.5–5.1)
Sodium: 135 mmol/L (ref 135–145)
Total Bilirubin: 0.9 mg/dL (ref 0.3–1.2)
Total Protein: 4.1 g/dL — ABNORMAL LOW (ref 6.5–8.1)

## 2020-07-08 LAB — CBC WITH DIFFERENTIAL/PLATELET
Abs Immature Granulocytes: 0.03 10*3/uL (ref 0.00–0.07)
Basophils Absolute: 0 10*3/uL (ref 0.0–0.1)
Basophils Relative: 0 %
Eosinophils Absolute: 0 10*3/uL (ref 0.0–0.5)
Eosinophils Relative: 1 %
HCT: 27.3 % — ABNORMAL LOW (ref 39.0–52.0)
Hemoglobin: 8.7 g/dL — ABNORMAL LOW (ref 13.0–17.0)
Immature Granulocytes: 2 %
Lymphocytes Relative: 25 %
Lymphs Abs: 0.5 10*3/uL — ABNORMAL LOW (ref 0.7–4.0)
MCH: 29.8 pg (ref 26.0–34.0)
MCHC: 31.9 g/dL (ref 30.0–36.0)
MCV: 93.5 fL (ref 80.0–100.0)
Monocytes Absolute: 0.2 10*3/uL (ref 0.1–1.0)
Monocytes Relative: 11 %
Neutro Abs: 1.3 10*3/uL — ABNORMAL LOW (ref 1.7–7.7)
Neutrophils Relative %: 61 %
Platelets: 96 10*3/uL — ABNORMAL LOW (ref 150–400)
RBC: 2.92 MIL/uL — ABNORMAL LOW (ref 4.22–5.81)
RDW: 17.2 % — ABNORMAL HIGH (ref 11.5–15.5)
WBC: 2 10*3/uL — ABNORMAL LOW (ref 4.0–10.5)
nRBC: 0 % (ref 0.0–0.2)

## 2020-07-08 LAB — GLUCOSE, CAPILLARY
Glucose-Capillary: 105 mg/dL — ABNORMAL HIGH (ref 70–99)
Glucose-Capillary: 258 mg/dL — ABNORMAL HIGH (ref 70–99)
Glucose-Capillary: 221 mg/dL — ABNORMAL HIGH (ref 70–99)
Glucose-Capillary: 165 mg/dL — ABNORMAL HIGH (ref 70–99)

## 2020-07-08 MED ORDER — SODIUM CHLORIDE 0.9 % IV SOLN
2.0000 g | INTRAVENOUS | Status: DC
  Administered 2020-07-08 – 2020-07-09 (×2): 2 g via INTRAVENOUS
  Filled 2020-07-08: qty 20
  Filled 2020-07-08: qty 2
  Filled 2020-07-08: qty 20

## 2020-07-08 MED ORDER — ENSURE ENLIVE PO LIQD: 237.0000 mL | Freq: Three times a day (TID) | ORAL | Status: AC

## 2020-07-08 MED ORDER — AMIODARONE HCL 200 MG PO TABS: 200.0000 mg | ORAL_TABLET | Freq: Every day | ORAL | 0 refills | Status: AC

## 2020-07-08 MED ORDER — SACUBITRIL-VALSARTAN 24-26 MG PO TABS: 2.0000 | ORAL_TABLET | Freq: Two times a day (BID) | ORAL | 0 refills | Status: AC

## 2020-07-08 MED ORDER — POLYETHYLENE GLYCOL 3350 17 G PO PACK: 17.0000 g | PACK | Freq: Every day | ORAL | 0 refills | Status: DC

## 2020-07-08 NOTE — Progress Notes (Signed)
Physical Therapy Treatment Patient Details Name: Tony Marshall MRN: [id number] DOB: 1941-06-21 Today's Date: 07/08/2020    History of Present Illness Tony Marshall is a 79 y.o. male with a history of CLL, chronic combined CHF (Dx June 2021), T2DM, HTN, HLD, diverticulosis who presented to the ED 7/14 with hematochezia found to be in DKA and admitted to ICU on insulin infusion with resolution of DKA. Atrial fibrillation with RVR developed for which cardiology was consulted, started amiodarone IV. The hematochezia was thought to be due to fecal impaction with stercoral ulcer and resolved with conservative treatment including transfusions. Bowel regimen was started. Due to arm swelling, U/S was performed demonstrating an age-indeterminate subclavian DVT. Heparin was started without bolus with subsequent recurrence of GI bleeding. Heparin was stopped and GI performed flexible sigmoidoscopy limited by significant stool burden/fecal impaction. Incomplete evacuation was performed 7/21.  He also had protracted hospitalization recently from involving septic shock possibly due to RLL cellulitis initially requiring pressors, improved on ancef.    PT Comments    Pt assisted with ambulating very short distance.  Pt fatigues quickly and requires solid min assist for ambulating.  Pt would benefit from w/c at home to decrease burden of care and for safety as pt is at high risk for falls.  Spouse states pt had not been able to start HHPT after last admission, and she declines SNF placement for pt due to not agreeable to available options. Pt has BSC and walker at home.  Spouse states nephew has been coming over to assist pt with mobility.   Follow Up Recommendations  Home health PT;Supervision/Assistance - 24 hour     Equipment Recommendations  Wheelchair (measurements PT)    Recommendations for Other Services       Precautions / Restrictions Precautions Precautions: Fall    Mobility  Bed  Mobility Overal bed mobility: Needs Assistance Bed Mobility: Supine to Sit;Sit to Supine     Supine to sit: Supervision Sit to supine: Supervision      Transfers Overall transfer level: Needs assistance Equipment used: Rolling walker (2 wheeled) Transfers: Sit to/from Stand Sit to Stand: Min assist         General transfer comment: assist to rise, steady and control descent (fatigue), cues for safety  Ambulation/Gait Ambulation/Gait assistance: Min assist Gait Distance (Feet): 28 Feet Assistive device: Rolling walker (2 wheeled) Gait Pattern/deviations: Decreased stride length;Decreased stance time - right;Antalgic     General Gait Details: continue to have antalgic gait, states yes to R LE still painful, min assist for weakness and steadying, pt also with increased forward lean requiring max cues and assist to correct (as he fatigued)   Stairs             Wheelchair Mobility    Modified Rankin (Stroke Patients Only)       Balance                                            Cognition Arousal/Alertness: Awake/alert Behavior During Therapy: Flat affect Overall Cognitive Status: Within Functional Limits for tasks assessed                                        Exercises      General Comments  Pertinent Vitals/Pain Pain Assessment: Faces Faces Pain Scale: Hurts little more Pain Location: back (chronic) Pain Descriptors / Indicators: Sore;Aching Pain Intervention(s): Repositioned;Patient requesting pain meds-RN notified;Monitored during session    Home Living                      Prior Function            PT Goals (current goals can now be found in the care plan section) Acute Rehab PT Goals PT Goal Formulation: With patient Time For Goal Achievement: 07/21/20 Potential to Achieve Goals: Good Progress towards PT goals: Progressing toward goals    Frequency    Min 3X/week      PT  Plan Current plan remains appropriate    Co-evaluation              AM-PAC PT "6 Clicks" Mobility   Outcome Measure  Help needed turning from your back to your side while in a flat bed without using bedrails?: A Little Help needed moving from lying on your back to sitting on the side of a flat bed without using bedrails?: A Little Help needed moving to and from a bed to a chair (including a wheelchair)?: A Little Help needed standing up from a chair using your arms (e.g., wheelchair or bedside chair)?: A Little Help needed to walk in hospital room?: A Lot Help needed climbing 3-5 steps with a railing? : A Lot 6 Click Score: 16    End of Session Equipment Utilized During Treatment: Gait belt Activity Tolerance: Patient limited by fatigue Patient left: in bed;with bed alarm set;with call bell/phone within reach   PT Visit Diagnosis: Other abnormalities of gait and mobility (R26.89)     Time: 9169-4503 PT Time Calculation (min) (ACUTE ONLY): 17 min  Charges:  $Gait Training: 8-22 mins                    Arlyce Dice, DPT Acute Rehabilitation Services Pager: [phone] Office: [phone]  York Ram E 07/08/2020, 3:25 PM

## 2020-07-08 NOTE — Progress Notes (Signed)
PROGRESS NOTE  Tony Marshall  JIR:[id number] DOB: 21-Nov-1941 DOA: 06/28/2020 PCP: Elayne Snare, MD   Brief Narrative: Tony Marshall is a 79 y.o. male with a history of CLL, chronic combined CHF (Dx June 2021), T2DM, HTN, HLD, diverticulosis who presented to the ED 7/14 with hematochezia found to be in DKA and admitted to ICU on insulin infusion with resolution of DKA. Atrial fibrillation with RVR developed for which cardiology was consulted, started amiodarone IV. The hematochezia was thought to be due to fecal impaction with stercoral ulcer and resolved with conservative treatment including transfusions. Bowel regimen was started. Due to arm swelling, U/S was performed demonstrating an age-indeterminate subclavian DVT. Heparin was started without bolus with subsequent recurrence of GI bleeding. Heparin was stopped and GI performed flexible sigmoidoscopy limited by significant stool burden/fecal impaction. Incomplete evacuation was performed 7/21.Bowel program has been augmented with significant liquid stool output. Repeat DRE 7/23 did not reveal any palpable stool and XR did not demonstrate significant stool burden or impaction.   He also had protracted hospitalization involving septic shock possibly due to RLL cellulitis initially requiring pressors, improved on ancef.  Assessment & Plan: Active Problems:   Essential hypertension   Pure hypercholesterolemia   CLL (chronic lymphocytic leukemia) (HCC)   Increased anion gap metabolic acidosis   Symptomatic anemia   Hyperglycemia due to diabetes mellitus (HCC)   DKA (diabetic ketoacidoses) (HCC)   Systolic and diastolic CHF, chronic (HCC)   Fecal impaction (HCC)   Overflow diarrhea   Rectal bleeding   Lower GI bleed   Central venous catheter in place   Shock circulatory ([place])   Prolonged QT interval   Atrial fibrillation (HCC)   Stercoral ulcer of rectum  Acute blood loss anemia due to GI bleeding due to stercoral ulcer due to fecal  impaction:  - s/p 2u PRBCs 7/17. Hgb down 10.1 > 8.4g/dl and relatively stable, though having intermittent bleeding. Goal hgb 8g/dl or higher.  - Consider carafate enema BID if not healing as expected. - Holding anticoagulation. GI believes continued risk of hemorrhage is very high while ulcer is present.  - Prevent constipation/re-impaction with daily miralax (decrease to once daily and hold psyllium for now). Has had manual disimpaction at time of sigmoidoscopy and no evidence of impaction on subsequent DRE or XR.   Klebsiella pneumonia of LLL: Retrocardiac opacity is infiltrate vs. atelectasis, likely both. +Sputum culture and cough is worsening.  - Start ceftriaxone   Age-indeterminate right subclavian DVT: Likely related to PICC that was in place during previous hospitalization (6/21 - 7/6). - Unable to anticoagulate at this time. Discussed risks and benefits w/family.   DKA in T2DM: Resolved s/p IV insulin. HbA1c 7.7% last month. DM followed by Dr. Dwyane Dee, Dx 1992. - Continue lantus 6u + SSI.   - Diabetes coordinator consulted   New onset atrial fibrillation with RVR:  - Continue telemetry, keep K, Mg replete. TSH 3.521.  - Continue amiodarone with planned taper 200mg  po BID > 200mg  qd on 7/25. - Continue coreg. Avoid CCB's w/LV systolic dysfunction. - [id number] score is 6 (CHF, HTN, age x2, T2DM, vascular calcifications by imaging), though holding anticoagulation in setting of GI bleed as above.    Chronic combined HFrEF: Roughly euvolemic on exam, though I/O shows net positive. Echo 06/07/2020 w/LVEF 25-30%, G2DD, RV appears normal in structure and function.  - Continue entresto (newly started), dose per cardiology. Cr, BP stable.  - No current need for diuretic.  - RHC/LHC was  planned once medically stabilized following recent hospitalization for septic shock.  CLL: Dx Oct 2014, Tx initiated 04/04/2020 with bendamustine/rituximab now s/p 3 cycles (last 6/15) and plan seems to be  for observation. Tolerates chemo poorly w/fatigue, weakness, weight loss, poor appetite, etc.  - Pancytopenia possibly related to chemotherapy. Discussed with Mikey Bussing, NP for oncology who spoke with Dr. Julien Nordmann. ANC this AM down to 1.1 in the absence of infection. No intervention currently planned.  Severe protein calorie malnutrition: Continues to have very minimal per oral intake. No related to nausea, vomiting, abdominal pain. Not related to any diet restrictions (there are none). It has persisted since chemotherapy.  - Supplement protein, unrestrict diet. Offered cortrak and tube feeding though the patient would like to defer for now and is tolerating ensures. Albumin 1.9.  OSA:  - CPAP qHS encouraged  Diffuse atherosclerosis of coronary arteries noted on imaging: No chest pain.  - On beta blocker, statin. No aspirin or anticoagulation at this time.   Prolonged QT interval: Stable, chronic.  - Minimize provocative agents.   Acute metabolic encephalopathy: Improved.  - Continue depakote and delirium precautions. Family at bedside appears to be helpful.   HTN:  - Cardiac medications as above  HLD:  - Continue statin  Hypokalemia: - Supplement and monitor   DVT prophylaxis: SCDs Code Status: Full Family Communication: Wife at bedside Disposition Plan:  Status is: Inpatient  Remains inpatient appropriate because:Inpatient level of care appropriate due to severity of illness.    Dispo: The patient is from: Home              Anticipated d/c is to: Home with home health and wheelchair which is ordered.              Anticipated d/c date is: 1 day if bleeding per rectum decreases and cough doesn't progress to hypoxia/respiratory distress due to pneumonia.              Patient currently is not medically stable to d/c.  Consultants:   GI  Cardiology  Procedures:   Flexible sigmoidoscopy 07/05/2020 Dr. Tarri Glenn:  Findings:      Non-bleeding external hhemorrhoids were  found on perianal exam.      A decubitus ulcer was also identified on perianal exam.      A single (solitary) ulcer was found in the rectum. No active bleeding       was present.      A large amount of solid stool was found in the rectum consistent with       fecal impaction, precluding any meaningful visualization. I manually       disimpacted the large rectal stool ball. Too much stool remained for any       additional meaningful evaluation of the colonic mucosa. Impression:       - Preparation of the colon was inadequate due to                            fecal impaction. Manual disimpaction performed                            during the procedure.                           - Non-bleeding external hemorrhoids found on  perianal exam.                           - Decubitus ulcer identified on perianal exam.                           - A single (solitary) ulcer in the rectum                            consistent with stercoral ulcer - the source of                            recent bleeding. Not actively bleeding.                           - No specimens collected. Moderate Sedation:      Not Applicable - Patient had care per Anesthesia. Recommendation:           - Return patient to hospital ward for ongoing care.                           - Advance diet as tolerated.                           - Aggressive bowel regimen to prevent recurrence of                            fecal impaction including Miralax 17 g BID,                            psyllium daily, and drinking at least 64 ounces of                            water daily.                           - Consider Carafate enemas 2 gm in 20 ml water BID                            x 6 weeks.                           - Avoid anticoagulation as able as this ulcer is at                            high risk for rebleeding.                           - I alerted the hospitalist team to the finding of                             the decubitus ulcer.                           I discussed these findings and recommendations with  the patient in the endoscopy recovery area and with                            his wife by telephone.                           Please call the on-call gastroenterologist with any                            additional questions or concerns during this                            hospitalization.  Antimicrobials:  None   Subjective: Some red bleeding in decreased frequency of stools. Stayed up all night with cough, no fever, minimal sputum production.   Objective: Vitals:   07/07/20 1200 07/07/20 2119 07/08/20 0500 07/08/20 0555  BP: (!) 138/74 (!) 166/78  (!) 142/80  Pulse: 71 63  65  Resp: 14 18  17   Temp: 98.2 F (36.8 C) 98.9 F (37.2 C)  98.4 F (36.9 C)  TempSrc: Oral Oral  Oral  SpO2: 100% 96%  97%  Weight:   55.6 kg   Height:        Intake/Output Summary (Last 24 hours) at 07/08/2020 1448 Last data filed at 07/08/2020 0028 Gross per 24 hour  Intake --  Output 600 ml  Net -600 ml   Filed Weights   07/06/20 0616 07/07/20 0542 07/08/20 0500  Weight: 56.7 kg 55.9 kg 55.6 kg   Gen: Frail pleasant male in no distress Pulm: Nonlabored breathing room air. Crackles R > L base that improve with deep breaths. CV: Regular rate and rhythm. No murmur, rub, or gallop. No JVD, no dependent edema. GI: Abdomen soft, non-tender, non-distended, with normoactive bowel sounds.  Ext: Warm, no deformities Skin: No rashes, lesions or ulcers on visualized skin. Neuro: Alert and oriented. No focal neurological deficits. Psych: Judgement and insight appear fair. Mood euthymic & affect congruent. Behavior is appropriate.    Data Reviewed: I have personally reviewed following labs and imaging studies  CBC: Recent Labs  Lab 07/03/20 0500 07/04/20 0707 07/05/20 1124 07/05/20 1124 07/05/20 1729 07/06/20 0501 07/06/20 1605 07/07/20 0429  07/08/20 0521  WBC 2.4*   < > 2.4*  --  3.2* 2.5*  --  1.9* 2.0*  NEUTROABS 1.6*  --   --   --   --   --   --  1.1* 1.3*  HGB 9.3*   < > 9.6*   < > 10.1* 8.4* 9.9* 8.6* 8.7*  HCT 28.8*   < > 29.0*   < > 30.8* 25.7* 30.7* 26.3* 27.3*  MCV 92.6   < > 92.1  --  90.9 92.1  --  92.6 93.5  PLT PLATELET CLUMPS NOTED ON SMEAR, UNABLE TO ESTIMATE   < > 110*  --  116* 106*  --  97* 96*   < > = values in this interval not displayed.   Basic Metabolic Panel: Recent Labs  Lab 07/01/20 1920 07/01/20 1920 07/02/20 0430 07/03/20 0500 07/04/20 0707 07/05/20 0428 07/06/20 0501 07/07/20 0429 07/08/20 0521  NA 133*   < > 135   < > 133* 131* 135 135 135  K 3.0*   < > 3.6   < > 3.2* 3.5 2.9* 3.1* 3.3*  CL 103   < > 101   < > 96* 95* 95* 95* 96*  CO2 25   < > 25   < > 28 28 32 32 32  GLUCOSE 196*   < > 212*   < > 173* 182* 169* 128* 119*  BUN 9   < > 9   < > 11 12 13 12 12   CREATININE 0.60*   < > 0.49*   < > 0.48* 0.46* 0.56* 0.52* 0.45*  CALCIUM 7.5*   < > 7.7*   < > 7.8* 7.9* 7.9* 7.8* 7.8*  MG 1.5*  --  1.7  --  1.7 1.7  --  1.7  --    < > = values in this interval not displayed.   GFR: Estimated Creatinine Clearance: 61.8 mL/min (A) (by C-G formula based on SCr of 0.45 mg/dL (L)). Liver Function Tests: Recent Labs  Lab 07/03/20 0500 07/08/20 0521  AST 17 18  ALT 9 15  ALKPHOS 64 57  BILITOT 0.6 0.9  PROT 3.5* 4.1*  ALBUMIN 1.7* 1.9*   No results for input(s): LIPASE, AMYLASE in the last 168 hours. No results for input(s): AMMONIA in the last 168 hours. Coagulation Profile: No results for input(s): INR, PROTIME in the last 168 hours. Cardiac Enzymes: No results for input(s): CKTOTAL, CKMB, CKMBINDEX, TROPONINI in the last 168 hours. BNP (last 3 results) No results for input(s): PROBNP in the last 8760 hours. HbA1C: No results for input(s): HGBA1C in the last 72 hours. CBG: Recent Labs  Lab 07/07/20 1245 07/07/20 1614 07/07/20 2014 07/08/20 0730 07/08/20 1359  GLUCAP 230*  212* 249* 105* 258*   Lipid Profile: No results for input(s): CHOL, HDL, LDLCALC, TRIG, CHOLHDL, LDLDIRECT in the last 72 hours. Thyroid Function Tests: No results for input(s): TSH, T4TOTAL, FREET4, T3FREE, THYROIDAB in the last 72 hours. Anemia Panel: No results for input(s): VITAMINB12, FOLATE, FERRITIN, TIBC, IRON, RETICCTPCT in the last 72 hours. Urine analysis:    Component Value Date/Time   COLORURINE YELLOW 06/28/2020 0758   APPEARANCEUR CLEAR 06/28/2020 0758   LABSPEC 1.018 06/28/2020 0758   PHURINE 5.0 06/28/2020 0758   GLUCOSEU >=500 (A) 06/28/2020 0758   GLUCOSEU 500 (A) 06/27/2020 1354   HGBUR NEGATIVE 06/28/2020 0758   BILIRUBINUR NEGATIVE 06/28/2020 0758   BILIRUBINUR negative 07/08/2018 1523   KETONESUR 80 (A) 06/28/2020 0758   PROTEINUR NEGATIVE 06/28/2020 0758   UROBILINOGEN 0.2 06/27/2020 1354   NITRITE NEGATIVE 06/28/2020 0758   LEUKOCYTESUR NEGATIVE 06/28/2020 0758   Recent Results (from the past 240 hour(s))  MRSA PCR Screening     Status: None   Collection Time: 06/28/20  8:54 PM   Specimen: Nasal Mucosa; Nasopharyngeal  Result Value Ref Range Status   MRSA by PCR NEGATIVE NEGATIVE Final    Comment:        The GeneXpert MRSA Assay (FDA approved for NASAL specimens only), is one component of a comprehensive MRSA colonization surveillance program. It is not intended to diagnose MRSA infection nor to guide or monitor treatment for MRSA infections. Performed at [facility], [place] [address] [place], [place] [address]   Expectorated sputum assessment w rflx to resp cult     Status: None   Collection Time: 07/06/20  1:30 AM   Specimen: Expectorated Sputum  Result Value Ref Range Status   Specimen Description EXPECTORATED SPUTUM  Final   Special Requests NONE  Final   Sputum evaluation   Final  THIS SPECIMEN IS ACCEPTABLE FOR SPUTUM CULTURE Performed at [facility], [place] [address] [place], [place]  [address]    Report Status 07/06/2020 FINAL  Final  Culture, respiratory     Status: None   Collection Time: 07/06/20  1:30 AM  Result Value Ref Range Status   Specimen Description   Final    EXPECTORATED SPUTUM Performed at [facility], [place] [address] [place], [place] [address]    Special Requests   Final    NONE Reflexed from [id number] Performed at [facility], [place] [address] [place], [place] [address]    Gram Stain   Final    RARE WBC PRESENT, PREDOMINANTLY PMN FEW GRAM POSITIVE COCCI RARE GRAM NEGATIVE RODS Performed at [facility], [place] [address], [place], [place] [address]    Culture MODERATE KLEBSIELLA PNEUMONIAE  Final   Report Status 07/08/2020 FINAL  Final   Organism ID, Bacteria KLEBSIELLA PNEUMONIAE  Final      Susceptibility   Klebsiella pneumoniae - MIC*    AMPICILLIN >=32 RESISTANT Resistant     CEFAZOLIN <=4 SENSITIVE Sensitive     CEFEPIME <=0.12 SENSITIVE Sensitive     CEFTAZIDIME <=1 SENSITIVE Sensitive     CEFTRIAXONE <=0.25 SENSITIVE Sensitive     CIPROFLOXACIN <=0.25 SENSITIVE Sensitive     GENTAMICIN <=1 SENSITIVE Sensitive     IMIPENEM <=0.25 SENSITIVE Sensitive     TRIMETH/SULFA <=20 SENSITIVE Sensitive     AMPICILLIN/SULBACTAM >=32 RESISTANT Resistant     PIP/TAZO <=4 SENSITIVE Sensitive     * MODERATE KLEBSIELLA PNEUMONIAE      Radiology Studies: DG CHEST PORT 1 VIEW  Result Date: 07/08/2020 CLINICAL DATA:  Progressive coughing. EXAM: PORTABLE CHEST 1 VIEW COMPARISON:  07/01/2020 FINDINGS: Normal heart size. Aortic atherosclerotic calcifications noted. No pleural effusion or edema identified. There is a retrocardiac opacity within the medial left lung base which may represent atelectasis or airspace disease. Right lung appears clear. IMPRESSION: Retrocardiac opacity within the left lung base which may represent atelectasis or airspace disease. Electronically Signed   By: Kerby Moors M.D.   On: 07/08/2020  05:27   DG Abd 2 Views  Result Date: 07/07/2020 CLINICAL DATA:  Fecal impaction EXAM: ABDOMEN - 2 VIEW COMPARISON:  None FINDINGS: Normal retained stool burden. Specifically no abnormal retained stool burden seen within the rectum. Scattered gas throughout colon. Small bowel gas pattern normal.  Bones demineralized. IMPRESSION: Normal bowel gas pattern. No abnormal retained stool burden. Electronically Signed   By: Lavonia Dana M.D.   On: 07/07/2020 12:51    Scheduled Meds: . sodium chloride   Intravenous Once  . sodium chloride   Intravenous Once  . amiodarone  200 mg Oral BID   Followed by  . [START ON 07/09/2020] amiodarone  200 mg Oral Daily  . barrier cream  1 application Topical BID  . carvedilol  12.5 mg Oral BID WC  . chlorhexidine  15 mL Mouth Rinse BID  . Chlorhexidine Gluconate Cloth  6 each Topical Daily  . divalproex  125 mg Oral Q12H  . feeding supplement (ENSURE ENLIVE)  237 mL Oral TID BM  . hydrocortisone cream   Topical TID  . insulin aspart  0-5 Units Subcutaneous QHS  . insulin aspart  0-9 Units Subcutaneous TID WC  . insulin glargine  6 Units Subcutaneous QHS  . magnesium oxide  400 mg Oral Daily  . mouth rinse  15 mL Mouth Rinse q12n4p  .  multivitamin with minerals  1 tablet Oral Daily  . polyethylene glycol  17 g Oral Daily  . potassium chloride  40 mEq Oral BID  . rosuvastatin  5 mg Oral Daily  . sacubitril-valsartan  1 tablet Oral BID  . sodium chloride flush  10-40 mL Intracatheter Q12H  . sucralfate  2 g Rectal BID   Continuous Infusions: . cefTRIAXone (ROCEPHIN)  IV    . lactated ringers 10 mL/hr at 07/05/20 1326     LOS: 10 days   Time spent: 35 minutes.  Patrecia Pour, MD [facility] [email or web address] 07/08/2020, 2:48 PM

## 2020-07-08 NOTE — Progress Notes (Signed)
    Durable Medical Equipment  (From admission, onward)         Start     Ordered   07/08/20 0701  For home use only DME standard manual wheelchair with seat cushion  Once       Comments: Patient suffers from weakness, DKA, CHF, blood loss anemia, fecal impaction, leukemia which impairs their ability to perform daily activities like bathing, dressing, feeding, grooming, and toileting in the home.  A cane, crutch, or walker will not resolve issue with performing activities of daily living. A wheelchair will allow patient to safely perform daily activities. Patient can safely propel the wheelchair in the home or has a caregiver who can provide assistance. Length of need 6 months . Accessories: elevating leg rests (ELRs), wheel locks, extensions and anti-tippers.   07/08/20 0700

## 2020-07-08 NOTE — TOC Progression Note (Signed)
Transition of Care [facility]) - Progression Note    Patient Details  Name: Tony Marshall MRN: [id number] Date of Birth: [birth year]  [facility] CM/SW Contact  Joaquin Courts, RN Phone Number: 07/08/2020, 2:12 PM  Clinical Narrative:    Referral for wheelchair given to [employer] rep [place].   Expected Discharge Plan: [place] Barriers to Discharge: Continued Medical Work up  Expected Discharge Plan and Services Expected Discharge Plan: [place] arrangements for the past 2 months: Single Family Home Expected Discharge Date: 07/08/20               DME Arranged: High strength lightweight manual wheelchair with seat cushion DME Agency: [employer] Date DME Agency Contacted: 07/08/20 Time DME Agency Contacted: [phone] Representative spoke with at DME Agency: [place] Interventions    Readmission Risk Interventions No flowsheet data found.

## 2020-07-08 NOTE — Plan of Care (Signed)
  Problem: Education: Goal: Ability to describe self-care measures that may prevent or decrease complications (Diabetes Survival Skills Education) will improve Outcome: Progressing Goal: Individualized Educational Video(s) Outcome: Progressing   Problem: Cardiac: Goal: Ability to maintain an adequate cardiac output will improve Outcome: Progressing   Problem: Health Behavior/Discharge Planning: Goal: Ability to identify and utilize available resources and services will improve Outcome: Progressing Goal: Ability to manage health-related needs will improve Outcome: Progressing   Problem: Fluid Volume: Goal: Ability to achieve a balanced intake and output will improve Outcome: Progressing   Problem: Metabolic: Goal: Ability to maintain appropriate glucose levels will improve Outcome: Progressing   Problem: Nutritional: Goal: Maintenance of adequate nutrition will improve Outcome: Progressing Goal: Maintenance of adequate weight for body size and type will improve Outcome: Progressing   Problem: Respiratory: Goal: Will regain and/or maintain adequate ventilation Outcome: Progressing   Problem: Urinary Elimination: Goal: Ability to achieve and maintain adequate renal perfusion and functioning will improve Outcome: Progressing   Problem: Health Behavior/Discharge Planning: Goal: Ability to manage health-related needs will improve Outcome: Progressing   Problem: Clinical Measurements: Goal: Ability to maintain clinical measurements within normal limits will improve Outcome: Progressing Goal: Will remain free from infection Outcome: Progressing Goal: Diagnostic test results will improve Outcome: Progressing Goal: Respiratory complications will improve Outcome: Progressing Goal: Cardiovascular complication will be avoided Outcome: Progressing   Problem: Activity: Goal: Risk for activity intolerance will decrease Outcome: Progressing   Problem: Nutrition: Goal:  Adequate nutrition will be maintained Outcome: Progressing   Problem: Coping: Goal: Level of anxiety will decrease Outcome: Progressing   Problem: Elimination: Goal: Will not experience complications related to bowel motility Outcome: Progressing Goal: Will not experience complications related to urinary retention Outcome: Progressing   Problem: Pain Managment: Goal: General experience of comfort will improve Outcome: Progressing   Problem: Safety: Goal: Ability to remain free from injury will improve Outcome: Progressing   Problem: Skin Integrity: Goal: Risk for impaired skin integrity will decrease Outcome: Progressing

## 2020-07-09 ENCOUNTER — Inpatient Hospital Stay (HOSPITAL_COMMUNITY): Payer: Medicare HMO

## 2020-07-09 DIAGNOSIS — E1110 Type 2 diabetes mellitus with ketoacidosis without coma: Secondary | ICD-10-CM | POA: Diagnosis not present

## 2020-07-09 DIAGNOSIS — I480 Paroxysmal atrial fibrillation: Secondary | ICD-10-CM | POA: Diagnosis not present

## 2020-07-09 DIAGNOSIS — I10 Essential (primary) hypertension: Secondary | ICD-10-CM | POA: Diagnosis not present

## 2020-07-09 DIAGNOSIS — C9110 Chronic lymphocytic leukemia of B-cell type not having achieved remission: Secondary | ICD-10-CM | POA: Diagnosis not present

## 2020-07-09 DIAGNOSIS — J150 Pneumonia due to Klebsiella pneumoniae: Secondary | ICD-10-CM

## 2020-07-09 LAB — GLUCOSE, CAPILLARY
Glucose-Capillary: 174 mg/dL — ABNORMAL HIGH (ref 70–99)
Glucose-Capillary: 145 mg/dL — ABNORMAL HIGH (ref 70–99)
Glucose-Capillary: 106 mg/dL — ABNORMAL HIGH (ref 70–99)
Glucose-Capillary: 236 mg/dL — ABNORMAL HIGH (ref 70–99)

## 2020-07-09 LAB — HEMOGLOBIN AND HEMATOCRIT, BLOOD
HCT: 26.2 % — ABNORMAL LOW (ref 39.0–52.0)
Hemoglobin: 8.4 g/dL — ABNORMAL LOW (ref 13.0–17.0)

## 2020-07-09 MED ORDER — DEXTROMETHORPHAN POLISTIREX ER 30 MG/5ML PO SUER
30.0000 mg | Freq: Two times a day (BID) | ORAL | Status: DC
  Administered 2020-07-09: 30 mg via ORAL
  Administered 2020-07-09: 15 mg via ORAL
  Administered 2020-07-10 – 2020-07-11 (×3): 30 mg via ORAL
  Filled 2020-07-09 (×5): qty 5

## 2020-07-09 MED ORDER — HYDROCOD POLST-CPM POLST ER 10-8 MG/5ML PO SUER
5.0000 mL | Freq: Two times a day (BID) | ORAL | Status: DC
  Administered 2020-07-09: 2.5 mL via ORAL
  Administered 2020-07-09 – 2020-07-10 (×2): 5 mL via ORAL
  Filled 2020-07-09 (×3): qty 5

## 2020-07-09 MED ORDER — TRAZODONE HCL 50 MG PO TABS
50.0000 mg | ORAL_TABLET | Freq: Every day | ORAL | Status: DC
  Administered 2020-07-09 – 2020-07-10 (×2): 50 mg via ORAL
  Filled 2020-07-09 (×2): qty 1

## 2020-07-09 NOTE — Plan of Care (Signed)
  Problem: Education: Goal: Ability to describe self-care measures that may prevent or decrease complications (Diabetes Survival Skills Education) will improve Outcome: Progressing Goal: Individualized Educational Video(s) Outcome: Progressing   Problem: Cardiac: Goal: Ability to maintain an adequate cardiac output will improve Outcome: Progressing   Problem: Health Behavior/Discharge Planning: Goal: Ability to identify and utilize available resources and services will improve Outcome: Progressing Goal: Ability to manage health-related needs will improve Outcome: Progressing   Problem: Fluid Volume: Goal: Ability to achieve a balanced intake and output will improve Outcome: Progressing   Problem: Metabolic: Goal: Ability to maintain appropriate glucose levels will improve Outcome: Progressing   Problem: Nutritional: Goal: Maintenance of adequate nutrition will improve Outcome: Progressing Goal: Maintenance of adequate weight for body size and type will improve Outcome: Progressing   Problem: Respiratory: Goal: Will regain and/or maintain adequate ventilation Outcome: Progressing   Problem: Urinary Elimination: Goal: Ability to achieve and maintain adequate renal perfusion and functioning will improve Outcome: Progressing   Problem: Health Behavior/Discharge Planning: Goal: Ability to manage health-related needs will improve Outcome: Progressing   Problem: Clinical Measurements: Goal: Ability to maintain clinical measurements within normal limits will improve Outcome: Progressing Goal: Will remain free from infection Outcome: Progressing Goal: Diagnostic test results will improve Outcome: Progressing Goal: Respiratory complications will improve Outcome: Progressing Goal: Cardiovascular complication will be avoided Outcome: Progressing   Problem: Activity: Goal: Risk for activity intolerance will decrease Outcome: Progressing   Problem: Nutrition: Goal:  Adequate nutrition will be maintained Outcome: Progressing   Problem: Coping: Goal: Level of anxiety will decrease Outcome: Progressing   Problem: Elimination: Goal: Will not experience complications related to bowel motility Outcome: Progressing Goal: Will not experience complications related to urinary retention Outcome: Progressing   Problem: Pain Managment: Goal: General experience of comfort will improve Outcome: Progressing   Problem: Safety: Goal: Ability to remain free from injury will improve Outcome: Progressing   Problem: Skin Integrity: Goal: Risk for impaired skin integrity will decrease Outcome: Progressing

## 2020-07-09 NOTE — Progress Notes (Signed)
PROGRESS NOTE  Tony Marshall  PFX:[id number] DOB: May 06, 1941 DOA: 06/28/2020 PCP: Elayne Snare, MD   Brief Narrative: Tony Marshall is a 79 y.o. male with a history of CLL, chronic combined CHF (Dx June 2021), T2DM, HTN, HLD, diverticulosis who presented to the ED 7/14 with hematochezia found to be in DKA and admitted to ICU on insulin infusion with resolution of DKA. Atrial fibrillation with RVR developed for which cardiology was consulted, started amiodarone IV. The hematochezia was thought to be due to fecal impaction with stercoral ulcer and resolved with conservative treatment including transfusions. Bowel regimen was started. Due to arm swelling, U/S was performed demonstrating an age-indeterminate subclavian DVT. Heparin was started without bolus with subsequent recurrence of GI bleeding. Heparin was stopped and GI performed flexible sigmoidoscopy limited by significant stool burden/fecal impaction. Incomplete evacuation was performed 7/21.Bowel program has been augmented with significant liquid stool output. Repeat DRE 7/23 did not reveal any palpable stool and XR did not demonstrate significant stool burden or impaction. The patient developed a cough with retrocardiac opacity on CXR and Klebsiella growing on sputum culture for which antibiotics were started.  He also had protracted hospitalization involving septic shock possibly due to RLL cellulitis initially requiring pressors, improved on ancef.  Assessment & Plan: Active Problems:   Essential hypertension   Pure hypercholesterolemia   CLL (chronic lymphocytic leukemia) (HCC)   Increased anion gap metabolic acidosis   Symptomatic anemia   Hyperglycemia due to diabetes mellitus (HCC)   DKA (diabetic ketoacidoses) (HCC)   Systolic and diastolic CHF, chronic (HCC)   Fecal impaction (HCC)   Overflow diarrhea   Rectal bleeding   Lower GI bleed   Central venous catheter in place   Shock circulatory ([place])   Prolonged QT  interval   Atrial fibrillation (HCC)   Stercoral ulcer of rectum  Acute blood loss anemia due to GI bleeding due to stercoral ulcer due to fecal impaction:  - s/p 2u PRBCs 7/17. Hgb down 10.1 > 8.4g/dl and stable, though having intermittent bleeding. Goal hgb 8g/dl or higher.  - Consider carafate enema BID if not healing as expected. - Holding anticoagulation. GI believes continued risk of hemorrhage is very high while ulcer is present.  - Prevent constipation/re-impaction with daily miralax (decrease to once daily and hold psyllium for now). Has had manual disimpaction at time of sigmoidoscopy and no evidence of impaction on subsequent DRE or XR.   Klebsiella pneumonia of LLL: Retrocardiac opacity is infiltrate vs. atelectasis, likely both. +Sputum culture and cough is worsening.  - Continue to have severe cough, will add antitussive regimen, continue ceftriaxone based on susceptibility data, repeat CXR.   Age-indeterminate right subclavian DVT: Likely related to PICC that was in place during previous hospitalization (6/21 - 7/6). - Unable to anticoagulate at this time. Discussed risks and benefits w/family.   DKA in T2DM: Resolved s/p IV insulin. HbA1c 7.7% last month. DM followed by Dr. Dwyane Dee, Dx 1992. - Continue lantus 6u + SSI.   - Diabetes coordinator consulted   New onset atrial fibrillation with RVR:  - Continue telemetry, keep K, Mg replete. TSH 3.521.  - Continue amiodarone 200mg  qd on 7/25. - Continue coreg. Avoid CCB's w/LV systolic dysfunction. - HGD9ME2-ASTM score is 6 (CHF, HTN, age x2, T2DM, vascular calcifications by imaging), though holding anticoagulation in setting of GI bleed as above.    Chronic combined HFrEF: Roughly euvolemic on exam, though I/O shows net positive. Echo 06/07/2020 w/LVEF 25-30%, G2DD, RV appears normal  in structure and function.  - Continue entresto (newly started), dose per cardiology. Cr, BP stable.  - No current need for diuretic.  - RHC/LHC  was planned once medically stabilized following recent hospitalization for septic shock.  CLL: Dx Oct 2014, Tx initiated 04/04/2020 with bendamustine/rituximab now s/p 3 cycles (last 6/15) and plan seems to be for observation. Tolerates chemo poorly w/fatigue, weakness, weight loss, poor appetite, etc.  - Pancytopenia possibly related to chemotherapy. Discussed with Mikey Bussing, NP for oncology who spoke with Dr. Julien Nordmann. ANC this AM down to 1.1 in the absence of infection. No intervention currently planned.  Severe protein calorie malnutrition: Continues to have very minimal per oral intake. No related to nausea, vomiting, abdominal pain. Not related to any diet restrictions (there are none). It has persisted since chemotherapy.  - Supplement protein, unrestrict diet. Offered cortrak and tube feeding though the patient would like to defer for now and is tolerating ensures. Albumin 1.9.  OSA:  - CPAP qHS encouraged  Diffuse atherosclerosis of coronary arteries noted on imaging: No chest pain.  - On beta blocker, statin. No aspirin or anticoagulation at this time.   Prolonged QT interval: Stable, chronic.  - Minimize provocative agents.   Acute metabolic encephalopathy: Improved.  - Continue depakote and delirium precautions. Family at bedside appears to be helpful.   HTN:  - Cardiac medications as above  HLD:  - Continue statin  Hypokalemia: - Supplement and monitor   DVT prophylaxis: SCDs Code Status: Full Family Communication: Wife at bedside Disposition Plan:  Status is: Inpatient  Remains inpatient appropriate because:Inpatient level of care appropriate due to severity of illness.    Dispo: The patient is from: Home              Anticipated d/c is to: Home with home health and wheelchair which is ordered.              Anticipated d/c date is: 1 day. Still having bleeding from rectum, hgb slightly lower this AM, feels unwell and just started treatment for pneumonia. If  bleeding improved, hgb stable, can DC in AM.               Patient currently is not medically stable to d/c.  Consultants:   GI  Cardiology  Procedures:   Flexible sigmoidoscopy 07/05/2020 Dr. Tarri Glenn:  Findings:      Non-bleeding external hhemorrhoids were found on perianal exam.      A decubitus ulcer was also identified on perianal exam.      A single (solitary) ulcer was found in the rectum. No active bleeding       was present.      A large amount of solid stool was found in the rectum consistent with       fecal impaction, precluding any meaningful visualization. I manually       disimpacted the large rectal stool ball. Too much stool remained for any       additional meaningful evaluation of the colonic mucosa. Impression:       - Preparation of the colon was inadequate due to                            fecal impaction. Manual disimpaction performed                            during the procedure.                           -  Non-bleeding external hemorrhoids found on                            perianal exam.                           - Decubitus ulcer identified on perianal exam.                           - A single (solitary) ulcer in the rectum                            consistent with stercoral ulcer - the source of                            recent bleeding. Not actively bleeding.                           - No specimens collected. Moderate Sedation:      Not Applicable - Patient had care per Anesthesia. Recommendation:           - Return patient to hospital ward for ongoing care.                           - Advance diet as tolerated.                           - Aggressive bowel regimen to prevent recurrence of                            fecal impaction including Miralax 17 g BID,                            psyllium daily, and drinking at least 64 ounces of                            water daily.                           - Consider Carafate enemas 2 gm in 20 ml  water BID                            x 6 weeks.                           - Avoid anticoagulation as able as this ulcer is at                            high risk for rebleeding.                           - I alerted the hospitalist team to the finding of                            the decubitus ulcer.  I discussed these findings and recommendations with                            the patient in the endoscopy recovery area and with                            his wife by telephone.                           Please call the on-call gastroenterologist with any                            additional questions or concerns during this                            hospitalization.  Antimicrobials:  None   Subjective: Wife and patient report more formed stools though having red spots in all bowel movements, painless. No melena. Coughing severely and not getting out of bed.  Objective: Vitals:   07/08/20 1504 07/08/20 2121 07/09/20 0613 07/09/20 0657  BP: (!) 167/78 (!) 156/77 (!) 193/98 (!) 159/80  Pulse: 68 64 79 69  Resp: 14 19 18    Temp: 98.2 F (36.8 C) 98.2 F (36.8 C) 97.7 F (36.5 C)   TempSrc: Oral Oral Oral   SpO2: 100% 99% 100%   Weight:      Height:        Intake/Output Summary (Last 24 hours) at 07/09/2020 1344 Last data filed at 07/09/2020 1236 Gross per 24 hour  Intake 100 ml  Output 1650 ml  Net -1550 ml   Filed Weights   07/06/20 0616 07/07/20 0542 07/08/20 0500  Weight: 56.7 kg 55.9 kg 55.6 kg   Gen: Frail elderly male in no distress Pulm: Nonlabored breathing room air, more crackles at left base than prior, no wheezes. CV: Regular rate and rhythm. No murmur, rub, or gallop. No JVD, no dependent edema. GI: Abdomen soft, non-tender, non-distended, with normoactive bowel sounds.  Ext: Warm, no deformities Skin: No rashes, lesions or ulcers on visualized skin. Neuro: Alert and oriented. Diffusely weak with no focal neurological  deficits. Psych: Judgement and insight appear normal. Mood euthymic & affect congruent. Behavior is appropriate.    Data Reviewed: I have personally reviewed following labs and imaging studies  CBC: Recent Labs  Lab 07/03/20 0500 07/04/20 0707 07/05/20 1124 07/05/20 1124 07/05/20 1729 07/05/20 1729 07/06/20 0501 07/06/20 1605 07/07/20 0429 07/08/20 0521 07/09/20 0939  WBC 2.4*   < > 2.4*  --  3.2*  --  2.5*  --  1.9* 2.0*  --   NEUTROABS 1.6*  --   --   --   --   --   --   --  1.1* 1.3*  --   HGB 9.3*   < > 9.6*   < > 10.1*   < > 8.4* 9.9* 8.6* 8.7* 8.4*  HCT 28.8*   < > 29.0*   < > 30.8*   < > 25.7* 30.7* 26.3* 27.3* 26.2*  MCV 92.6   < > 92.1  --  90.9  --  92.1  --  92.6 93.5  --   PLT PLATELET CLUMPS NOTED ON SMEAR, UNABLE TO ESTIMATE   < > 110*  --  116*  --  106*  --  97* 96*  --    < > = values in this interval not displayed.   Basic Metabolic Panel: Recent Labs  Lab 07/04/20 0707 07/05/20 0428 07/06/20 0501 07/07/20 0429 07/08/20 0521  NA 133* 131* 135 135 135  K 3.2* 3.5 2.9* 3.1* 3.3*  CL 96* 95* 95* 95* 96*  CO2 28 28 32 32 32  GLUCOSE 173* 182* 169* 128* 119*  BUN 11 12 13 12 12   CREATININE 0.48* 0.46* 0.56* 0.52* 0.45*  CALCIUM 7.8* 7.9* 7.9* 7.8* 7.8*  MG 1.7 1.7  --  1.7  --    GFR: Estimated Creatinine Clearance: 61.8 mL/min (A) (by C-G formula based on SCr of 0.45 mg/dL (L)). Liver Function Tests: Recent Labs  Lab 07/03/20 0500 07/08/20 0521  AST 17 18  ALT 9 15  ALKPHOS 64 57  BILITOT 0.6 0.9  PROT 3.5* 4.1*  ALBUMIN 1.7* 1.9*   No results for input(s): LIPASE, AMYLASE in the last 168 hours. No results for input(s): AMMONIA in the last 168 hours. Coagulation Profile: No results for input(s): INR, PROTIME in the last 168 hours. Cardiac Enzymes: No results for input(s): CKTOTAL, CKMB, CKMBINDEX, TROPONINI in the last 168 hours. BNP (last 3 results) No results for input(s): PROBNP in the last 8760 hours. HbA1C: No results for  input(s): HGBA1C in the last 72 hours. CBG: Recent Labs  Lab 07/08/20 1359 07/08/20 1647 07/08/20 2119 07/09/20 0819 07/09/20 1142  GLUCAP 258* 221* 165* 174* 145*   Lipid Profile: No results for input(s): CHOL, HDL, LDLCALC, TRIG, CHOLHDL, LDLDIRECT in the last 72 hours. Thyroid Function Tests: No results for input(s): TSH, T4TOTAL, FREET4, T3FREE, THYROIDAB in the last 72 hours. Anemia Panel: No results for input(s): VITAMINB12, FOLATE, FERRITIN, TIBC, IRON, RETICCTPCT in the last 72 hours. Urine analysis:    Component Value Date/Time   COLORURINE YELLOW 06/28/2020 0758   APPEARANCEUR CLEAR 06/28/2020 0758   LABSPEC 1.018 06/28/2020 0758   PHURINE 5.0 06/28/2020 0758   GLUCOSEU >=500 (A) 06/28/2020 0758   GLUCOSEU 500 (A) 06/27/2020 1354   HGBUR NEGATIVE 06/28/2020 0758   BILIRUBINUR NEGATIVE 06/28/2020 0758   BILIRUBINUR negative 07/08/2018 1523   KETONESUR 80 (A) 06/28/2020 0758   PROTEINUR NEGATIVE 06/28/2020 0758   UROBILINOGEN 0.2 06/27/2020 1354   NITRITE NEGATIVE 06/28/2020 0758   LEUKOCYTESUR NEGATIVE 06/28/2020 0758   Recent Results (from the past 240 hour(s))  Expectorated sputum assessment w rflx to resp cult     Status: None   Collection Time: 07/06/20  1:30 AM   Specimen: Expectorated Sputum  Result Value Ref Range Status   Specimen Description EXPECTORATED SPUTUM  Final   Special Requests NONE  Final   Sputum evaluation   Final    THIS SPECIMEN IS ACCEPTABLE FOR SPUTUM CULTURE Performed at [facility] [place] [address], [place], [place] [address]    Report Status 07/06/2020 FINAL  Final  Culture, respiratory     Status: None   Collection Time: 07/06/20  1:30 AM  Result Value Ref Range Status   Specimen Description   Final    EXPECTORATED SPUTUM Performed at [facility], [place] [address], [place], [place] [address]    Special Requests   Final    NONE Reflexed from [phone] Performed at [facility], [place] [address], [place], [place] [address]    Gram Stain   Final    RARE WBC PRESENT, PREDOMINANTLY PMN FEW GRAM POSITIVE  COCCI RARE GRAM NEGATIVE RODS Performed at [facility], [place] [address], [place], [place] [address]    Culture MODERATE KLEBSIELLA PNEUMONIAE  Final   Report Status 07/08/2020 FINAL  Final   Organism ID, Bacteria KLEBSIELLA PNEUMONIAE  Final      Susceptibility   Klebsiella pneumoniae - MIC*    AMPICILLIN >=32 RESISTANT Resistant     CEFAZOLIN <=4 SENSITIVE Sensitive     CEFEPIME <=0.12 SENSITIVE Sensitive     CEFTAZIDIME <=1 SENSITIVE Sensitive     CEFTRIAXONE <=0.25 SENSITIVE Sensitive     CIPROFLOXACIN <=0.25 SENSITIVE Sensitive     GENTAMICIN <=1 SENSITIVE Sensitive     IMIPENEM <=0.25 SENSITIVE Sensitive     TRIMETH/SULFA <=20 SENSITIVE Sensitive     AMPICILLIN/SULBACTAM >=32 RESISTANT Resistant     PIP/TAZO <=4 SENSITIVE Sensitive     * MODERATE KLEBSIELLA PNEUMONIAE      Radiology Studies: DG CHEST PORT 1 VIEW  Result Date: 07/08/2020 CLINICAL DATA:  Progressive coughing. EXAM: PORTABLE CHEST 1 VIEW COMPARISON:  07/01/2020 FINDINGS: Normal heart size. Aortic atherosclerotic calcifications noted. No pleural effusion or edema identified. There is a retrocardiac opacity within the medial left lung base which may represent atelectasis or airspace disease. Right lung appears clear. IMPRESSION: Retrocardiac opacity within the left lung base which may represent atelectasis or airspace disease. Electronically Signed   By: Kerby Moors M.D.   On: 07/08/2020 05:27    Scheduled Meds: . sodium chloride   Intravenous Once  . sodium chloride   Intravenous Once  . amiodarone  200 mg Oral Daily  . barrier cream  1 application Topical BID  . carvedilol  12.5 mg Oral BID WC  . chlorhexidine  15 mL Mouth Rinse BID  . chlorpheniramine-HYDROcodone  5 mL Oral Q12H  . dextromethorphan  30 mg Oral BID  . divalproex  125 mg Oral Q12H  . feeding  supplement (ENSURE ENLIVE)  237 mL Oral TID BM  . hydrocortisone cream   Topical TID  . insulin aspart  0-5 Units Subcutaneous QHS  . insulin aspart  0-9 Units Subcutaneous TID WC  . insulin glargine  6 Units Subcutaneous QHS  . magnesium oxide  400 mg Oral Daily  . mouth rinse  15 mL Mouth Rinse q12n4p  . multivitamin with minerals  1 tablet Oral Daily  . polyethylene glycol  17 g Oral Daily  . potassium chloride  40 mEq Oral BID  . rosuvastatin  5 mg Oral Daily  . sacubitril-valsartan  1 tablet Oral BID  . sodium chloride flush  10-40 mL Intracatheter Q12H  . traZODone  50 mg Oral QHS   Continuous Infusions: . cefTRIAXone (ROCEPHIN)  IV 2 g (07/08/20 1709)     LOS: 11 days   Time spent: 25 minutes.  Patrecia Pour, MD [facility] [email or web address] 07/09/2020, 1:44 PM

## 2020-07-10 DIAGNOSIS — C9110 Chronic lymphocytic leukemia of B-cell type not having achieved remission: Secondary | ICD-10-CM | POA: Diagnosis not present

## 2020-07-10 DIAGNOSIS — E1110 Type 2 diabetes mellitus with ketoacidosis without coma: Secondary | ICD-10-CM | POA: Diagnosis not present

## 2020-07-10 DIAGNOSIS — I480 Paroxysmal atrial fibrillation: Secondary | ICD-10-CM | POA: Diagnosis not present

## 2020-07-10 DIAGNOSIS — I10 Essential (primary) hypertension: Secondary | ICD-10-CM | POA: Diagnosis not present

## 2020-07-10 LAB — GLUCOSE, CAPILLARY
Glucose-Capillary: 163 mg/dL — ABNORMAL HIGH (ref 70–99)
Glucose-Capillary: 107 mg/dL — ABNORMAL HIGH (ref 70–99)
Glucose-Capillary: 224 mg/dL — ABNORMAL HIGH (ref 70–99)
Glucose-Capillary: 145 mg/dL — ABNORMAL HIGH (ref 70–99)

## 2020-07-10 LAB — BASIC METABOLIC PANEL
Anion gap: 8 (ref 5–15)
BUN: 12 mg/dL (ref 8–23)
CO2: 30 mmol/L (ref 22–32)
Calcium: 8 mg/dL — ABNORMAL LOW (ref 8.9–10.3)
Chloride: 96 mmol/L — ABNORMAL LOW (ref 98–111)
Creatinine, Ser: 0.45 mg/dL — ABNORMAL LOW (ref 0.61–1.24)
GFR calc Af Amer: >60 mL/min (ref >60)
GFR calc non Af Amer: >60 mL/min (ref >60)
Glucose, Bld: 186 mg/dL — ABNORMAL HIGH (ref 70–99)
Potassium: 4.2 mmol/L (ref 3.5–5.1)
Sodium: 134 mmol/L — ABNORMAL LOW (ref 135–145)

## 2020-07-10 LAB — CBC
HCT: 25.6 % — ABNORMAL LOW (ref 39.0–52.0)
Hemoglobin: 8.1 g/dL — ABNORMAL LOW (ref 13.0–17.0)
MCH: 30.1 pg (ref 26.0–34.0)
MCHC: 31.6 g/dL (ref 30.0–36.0)
MCV: 95.2 fL (ref 80.0–100.0)
Platelets: 113 10*3/uL — ABNORMAL LOW (ref 150–400)
RBC: 2.69 MIL/uL — ABNORMAL LOW (ref 4.22–5.81)
RDW: 17.2 % — ABNORMAL HIGH (ref 11.5–15.5)
WBC: 1.7 10*3/uL — ABNORMAL LOW (ref 4.0–10.5)
nRBC: 0 % (ref 0.0–0.2)

## 2020-07-10 LAB — HEMOGLOBIN AND HEMATOCRIT, BLOOD
HCT: 25.7 % — ABNORMAL LOW (ref 39.0–52.0)
Hemoglobin: 8.3 g/dL — ABNORMAL LOW (ref 13.0–17.0)

## 2020-07-10 LAB — MAGNESIUM: Magnesium: 1.8 mg/dL (ref 1.7–2.4)

## 2020-07-10 MED ORDER — CEFAZOLIN SODIUM-DEXTROSE 2-4 GM/100ML-% IV SOLN
2.0000 g | Freq: Three times a day (TID) | INTRAVENOUS | Status: DC
  Administered 2020-07-10 – 2020-07-11 (×3): 2 g via INTRAVENOUS
  Filled 2020-07-10 (×4): qty 100

## 2020-07-10 MED ORDER — GUAIFENESIN-DM 100-10 MG/5ML PO SYRP
5.0000 mL | ORAL_SOLUTION | ORAL | Status: DC | PRN
  Administered 2020-07-10 – 2020-07-11 (×2): 5 mL via ORAL
  Filled 2020-07-10 (×2): qty 10

## 2020-07-10 NOTE — Progress Notes (Signed)
PROGRESS NOTE  Tony Marshall  UMP:[id number] DOB: 29-Mar-1941 DOA: 06/28/2020 PCP: Elayne Snare, MD   Brief Narrative: Tony Marshall is a 79 y.o. male with a history of CLL, chronic combined CHF (Dx June 2021), T2DM, HTN, HLD, diverticulosis who presented to the ED 7/14 with hematochezia found to be in DKA and admitted to ICU on insulin infusion with resolution of DKA. Atrial fibrillation with RVR developed for which cardiology was consulted, started amiodarone IV. The hematochezia was thought to be due to fecal impaction with stercoral ulcer and resolved with conservative treatment including transfusions. Bowel regimen was started. Due to arm swelling, U/S was performed demonstrating an age-indeterminate subclavian DVT. Heparin was started without bolus with subsequent recurrence of GI bleeding. Heparin was stopped and GI performed flexible sigmoidoscopy limited by significant stool burden/fecal impaction. Incomplete evacuation was performed 7/21.Bowel program has been augmented with significant liquid stool output. Repeat DRE 7/23 did not reveal any palpable stool and XR did not demonstrate significant stool burden or impaction. The patient developed a cough with retrocardiac opacity on CXR and Klebsiella growing on sputum culture for which antibiotics were started.  He also had protracted hospitalization involving septic shock possibly due to RLL cellulitis initially requiring pressors, improved on ancef.  Assessment & Plan: Active Problems:   Essential hypertension   Pure hypercholesterolemia   CLL (chronic lymphocytic leukemia) (HCC)   Increased anion gap metabolic acidosis   Symptomatic anemia   Hyperglycemia due to diabetes mellitus (HCC)   DKA (diabetic ketoacidoses) (HCC)   Systolic and diastolic CHF, chronic (HCC)   Fecal impaction (HCC)   Overflow diarrhea   Rectal bleeding   Lower GI bleed   Central venous catheter in place   Shock circulatory ([place])   Prolonged QT  interval   Atrial fibrillation (HCC)   Stercoral ulcer of rectum  Acute blood loss anemia due to GI bleeding due to stercoral ulcer due to fecal impaction:  - s/p 2u PRBCs 7/17. Hgb down 10.1 > 8.1g/dl with continued intermittent bleeding. Goal hgb 8g/dl or higher.  - Consider carafate enema BID if not healing as expected. - Holding anticoagulation. GI believes continued risk of hemorrhage is very high while ulcer is present.  - Prevent constipation/re-impaction with daily miralax (decrease to once daily and hold psyllium for now). Has had manual disimpaction at time of sigmoidoscopy and no evidence of impaction on subsequent DRE or XR.   Klebsiella pneumonia of LLL: Retrocardiac opacity is infiltrate vs. atelectasis, likely both. +Sputum culture and cough is worsening.  - Continue to have severe cough, continue antitussive regimen - Continue ancef per susceptibility data - Recommend repeat CXR at follow up.   Age-indeterminate right subclavian DVT: Likely related to PICC that was in place during previous hospitalization (6/21 - 7/6). - Unable to anticoagulate at this time. Discussed risks and benefits w/family.   DKA in T2DM: Resolved s/p IV insulin. HbA1c 7.7% last month. DM followed by Dr. Dwyane Dee, Dx 1992. - Continue lantus 6u + SSI.   - Diabetes coordinator consulted   New onset atrial fibrillation with RVR:  - Continue telemetry, keep K, Mg replete. TSH 3.521.  - Continue amiodarone 200mg  qd on 7/25. - Continue coreg. Avoid CCB's w/LV systolic dysfunction. - [id number] score is 6 (CHF, HTN, age x2, T2DM, vascular calcifications by imaging), though holding anticoagulation in setting of GI bleed as above.   - Will need cardiology follow up.  Chronic combined HFrEF: Roughly euvolemic on exam, though I/O shows net positive.  Echo 06/07/2020 w/LVEF 25-30%, G2DD, RV appears normal in structure and function.  - Continue entresto (newly started), dose per cardiology. Cr, BP stable.  - No  current need for diuretic.  - RHC/LHC was planned once medically stabilized following recent hospitalization for septic shock.  CLL: Dx Oct 2014, Tx initiated 04/04/2020 with bendamustine/rituximab now s/p 3 cycles (last 6/15) and plan seems to be for observation. Tolerates chemo poorly w/fatigue, weakness, weight loss, poor appetite, etc.  - Pancytopenia possibly related to chemotherapy. Discussed with Mikey Bussing, NP for oncology who spoke with Dr. Julien Nordmann. ANC this AM down to 1.1 in the absence of infection. No intervention currently planned.  Severe protein calorie malnutrition: Continues to have very minimal per oral intake. No related to nausea, vomiting, abdominal pain. Not related to any diet restrictions (there are none). It has persisted since chemotherapy.  - Supplement protein, unrestrict diet. Offered cortrak and tube feeding though the patient would like to defer for now and is tolerating protein supplements. Albumin 1.9.  OSA:  - CPAP qHS encouraged  Diffuse atherosclerosis of coronary arteries noted on imaging: No chest pain.  - On beta blocker, statin. No aspirin or anticoagulation at this time.   Prolonged QT interval: Stable, chronic.  - Minimize provocative agents.   Acute metabolic encephalopathy: Improved.  - Continue depakote and delirium precautions. Family at bedside is helpful.   HTN:  - Cardiac medications as above  HLD:  - Continue statin  Hypokalemia: - Supplement and monitor   DVT prophylaxis: SCDs Code Status: Full Family Communication: Wife at bedside Disposition Plan:  Status is: Inpatient  Remains inpatient appropriate because:Inpatient level of care appropriate due to severity of illness.    Dispo: The patient is from: Home              Anticipated d/c is to: SNF based on recent/continued weakness. TOC consulted.              Anticipated d/c date is: 07/11/2020 if hemoglobin is stable              Patient currently is not medically stable  to d/c.  Consultants:   GI  Cardiology  Procedures:   Flexible sigmoidoscopy 07/05/2020 Dr. Tarri Glenn:  Findings:      Non-bleeding external hhemorrhoids were found on perianal exam.      A decubitus ulcer was also identified on perianal exam.      A single (solitary) ulcer was found in the rectum. No active bleeding       was present.      A large amount of solid stool was found in the rectum consistent with       fecal impaction, precluding any meaningful visualization. I manually       disimpacted the large rectal stool ball. Too much stool remained for any       additional meaningful evaluation of the colonic mucosa. Impression:       - Preparation of the colon was inadequate due to                            fecal impaction. Manual disimpaction performed                            during the procedure.                           -  Non-bleeding external hemorrhoids found on                            perianal exam.                           - Decubitus ulcer identified on perianal exam.                           - A single (solitary) ulcer in the rectum                            consistent with stercoral ulcer - the source of                            recent bleeding. Not actively bleeding.                           - No specimens collected. Moderate Sedation:      Not Applicable - Patient had care per Anesthesia. Recommendation:           - Return patient to hospital ward for ongoing care.                           - Advance diet as tolerated.                           - Aggressive bowel regimen to prevent recurrence of                            fecal impaction including Miralax 17 g BID,                            psyllium daily, and drinking at least 64 ounces of                            water daily.                           - Consider Carafate enemas 2 gm in 20 ml water BID                            x 6 weeks.                           - Avoid anticoagulation as  able as this ulcer is at                            high risk for rebleeding.                           - I alerted the hospitalist team to the finding of                            the decubitus ulcer.  I discussed these findings and recommendations with                            the patient in the endoscopy recovery area and with                            his wife by telephone.                           Please call the on-call gastroenterologist with any                            additional questions or concerns during this                            hospitalization.  Antimicrobials:  None   Subjective: Still having some bleeding, feels more weak, still coughing. Wife reports he got a bit more confused with hydrocodone cough syrup. No chest pain or dyspnea.   Objective: Vitals:   07/09/20 1421 07/09/20 2056 07/10/20 0624 07/10/20 1411  BP: (!) 176/77 (!) 146/72 (!) 150/78 128/72  Pulse: 61 68 71 71  Resp: 13 18 19 16   Temp: 97.7 F (36.5 C) 98.5 F (36.9 C) 98.5 F (36.9 C) 97.9 F (36.6 C)  TempSrc: Oral Oral Oral Oral  SpO2: 99% 97% 96% 97%  Weight:      Height:        Intake/Output Summary (Last 24 hours) at 07/10/2020 1510 Last data filed at 07/10/2020 0533 Gross per 24 hour  Intake 360 ml  Output 625 ml  Net -265 ml   Filed Weights   07/06/20 0616 07/07/20 0542 07/08/20 0500  Weight: 56.7 kg 55.9 kg 55.6 kg   Gen: Frail 79yo male in no distress Pulm: Nonlabored breathing room air. Crackles improved. No wheezes.. CV: Regular rate and rhythm. No murmur, rub, or gallop. No JVD, no dependent edema. GI: Abdomen soft, non-tender, non-distended, with normoactive bowel sounds.  Ext: Warm, no deformities Skin: No rashes, lesions or ulcers on visualized skin. Neuro: Alert and oriented. No focal neurological deficits. Psych: Judgement and insight appear fair. Mood euthymic & affect congruent. Behavior is appropriate.    Data Reviewed: I  have personally reviewed following labs and imaging studies  CBC: Recent Labs  Lab 07/05/20 1729 07/05/20 1729 07/06/20 0501 07/06/20 0501 07/06/20 1605 07/07/20 0429 07/08/20 0521 07/09/20 0939 07/10/20 0527  WBC 3.2*  --  2.5*  --   --  1.9* 2.0*  --  1.7*  NEUTROABS  --   --   --   --   --  1.1* 1.3*  --   --   HGB 10.1*   < > 8.4*   < > 9.9* 8.6* 8.7* 8.4* 8.1*  HCT 30.8*   < > 25.7*   < > 30.7* 26.3* 27.3* 26.2* 25.6*  MCV 90.9  --  92.1  --   --  92.6 93.5  --  95.2  PLT 116*  --  106*  --   --  97* 96*  --  113*   < > = values in this interval not displayed.   Basic Metabolic Panel: Recent Labs  Lab 07/04/20 0707 07/04/20 1950 07/05/20 0428 07/06/20 0501 07/07/20 0429 07/08/20 0521 07/10/20 0527  NA  133*   < > 131* 135 135 135 134*  K 3.2*   < > 3.5 2.9* 3.1* 3.3* 4.2  CL 96*   < > 95* 95* 95* 96* 96*  CO2 28   < > 28 32 32 32 30  GLUCOSE 173*   < > 182* 169* 128* 119* 186*  BUN 11   < > 12 13 12 12 12   CREATININE 0.48*   < > 0.46* 0.56* 0.52* 0.45* 0.45*  CALCIUM 7.8*   < > 7.9* 7.9* 7.8* 7.8* 8.0*  MG 1.7  --  1.7  --  1.7  --  1.8   < > = values in this interval not displayed.   GFR: Estimated Creatinine Clearance: 61.8 mL/min (A) (by C-G formula based on SCr of 0.45 mg/dL (L)). Liver Function Tests: Recent Labs  Lab 07/08/20 0521  AST 18  ALT 15  ALKPHOS 57  BILITOT 0.9  PROT 4.1*  ALBUMIN 1.9*   No results for input(s): LIPASE, AMYLASE in the last 168 hours. No results for input(s): AMMONIA in the last 168 hours. Coagulation Profile: No results for input(s): INR, PROTIME in the last 168 hours. Cardiac Enzymes: No results for input(s): CKTOTAL, CKMB, CKMBINDEX, TROPONINI in the last 168 hours. BNP (last 3 results) No results for input(s): PROBNP in the last 8760 hours. HbA1C: No results for input(s): HGBA1C in the last 72 hours. CBG: Recent Labs  Lab 07/09/20 1142 07/09/20 1617 07/09/20 2056 07/10/20 0741 07/10/20 1159  GLUCAP 145*  106* 236* 163* 107*   Lipid Profile: No results for input(s): CHOL, HDL, LDLCALC, TRIG, CHOLHDL, LDLDIRECT in the last 72 hours. Thyroid Function Tests: No results for input(s): TSH, T4TOTAL, FREET4, T3FREE, THYROIDAB in the last 72 hours. Anemia Panel: No results for input(s): VITAMINB12, FOLATE, FERRITIN, TIBC, IRON, RETICCTPCT in the last 72 hours. Urine analysis:    Component Value Date/Time   COLORURINE YELLOW 06/28/2020 0758   APPEARANCEUR CLEAR 06/28/2020 0758   LABSPEC 1.018 06/28/2020 0758   PHURINE 5.0 06/28/2020 0758   GLUCOSEU >=500 (A) 06/28/2020 0758   GLUCOSEU 500 (A) 06/27/2020 1354   HGBUR NEGATIVE 06/28/2020 0758   BILIRUBINUR NEGATIVE 06/28/2020 0758   BILIRUBINUR negative 07/08/2018 1523   KETONESUR 80 (A) 06/28/2020 0758   PROTEINUR NEGATIVE 06/28/2020 0758   UROBILINOGEN 0.2 06/27/2020 1354   NITRITE NEGATIVE 06/28/2020 0758   LEUKOCYTESUR NEGATIVE 06/28/2020 0758   Recent Results (from the past 240 hour(s))  Expectorated sputum assessment w rflx to resp cult     Status: None   Collection Time: 07/06/20  1:30 AM   Specimen: Expectorated Sputum  Result Value Ref Range Status   Specimen Description EXPECTORATED SPUTUM  Final   Special Requests NONE  Final   Sputum evaluation   Final    THIS SPECIMEN IS ACCEPTABLE FOR SPUTUM CULTURE Performed at [facility], [place] [address] [place], [place] [address]    Report Status 07/06/2020 FINAL  Final  Culture, respiratory     Status: None   Collection Time: 07/06/20  1:30 AM  Result Value Ref Range Status   Specimen Description   Final    EXPECTORATED SPUTUM Performed at [facility], [place] [address] [place], [place] [address]    Special Requests   Final    NONE Reflexed from [phone] Performed at [facility], [place] [address] [place], [place] [address]    Gram Stain   Final    RARE WBC PRESENT,  PREDOMINANTLY PMN FEW GRAM POSITIVE COCCI RARE GRAM  NEGATIVE RODS Performed at [facility], [place] [address], [place], [place] [address]    Culture MODERATE KLEBSIELLA PNEUMONIAE  Final   Report Status 07/08/2020 FINAL  Final   Organism ID, Bacteria KLEBSIELLA PNEUMONIAE  Final      Susceptibility   Klebsiella pneumoniae - MIC*    AMPICILLIN >=32 RESISTANT Resistant     CEFAZOLIN <=4 SENSITIVE Sensitive     CEFEPIME <=0.12 SENSITIVE Sensitive     CEFTAZIDIME <=1 SENSITIVE Sensitive     CEFTRIAXONE <=0.25 SENSITIVE Sensitive     CIPROFLOXACIN <=0.25 SENSITIVE Sensitive     GENTAMICIN <=1 SENSITIVE Sensitive     IMIPENEM <=0.25 SENSITIVE Sensitive     TRIMETH/SULFA <=20 SENSITIVE Sensitive     AMPICILLIN/SULBACTAM >=32 RESISTANT Resistant     PIP/TAZO <=4 SENSITIVE Sensitive     * MODERATE KLEBSIELLA PNEUMONIAE      Radiology Studies: DG Chest 2 View  Result Date: 07/09/2020 CLINICAL DATA:  Pneumonia. EXAM: CHEST - 2 VIEW COMPARISON:  07/08/2020 FINDINGS: Lungs are somewhat hypoinflated demonstrate hazy opacification over the medial right base and left retrocardiac region likely representing layering small to moderate effusions as seen on the lateral film with associated basilar atelectasis. Small nodule opacity over the right mid lung not seen previously. Cardiomediastinal silhouette and remainder of the exam is unchanged. IMPRESSION: Small to moderate bilateral layering pleural effusions likely with associated bibasilar atelectasis. 2. Small nodular density projects over the right midlung. Recommend follow-up PA chest radiograph. If this persists, then CT chest is recommended on elective basis. Electronically Signed   By: Marin Olp M.D.   On: 07/09/2020 14:40    Scheduled Meds: . sodium chloride   Intravenous Once  . sodium chloride   Intravenous Once  . amiodarone  200 mg Oral Daily  . barrier cream  1 application Topical BID  . carvedilol  12.5 mg Oral BID WC  . chlorhexidine  15 mL Mouth Rinse BID  .  chlorpheniramine-HYDROcodone  5 mL Oral Q12H  . dextromethorphan  30 mg Oral BID  . divalproex  125 mg Oral Q12H  . feeding supplement (ENSURE ENLIVE)  237 mL Oral TID BM  . hydrocortisone cream   Topical TID  . insulin aspart  0-5 Units Subcutaneous QHS  . insulin aspart  0-9 Units Subcutaneous TID WC  . insulin glargine  6 Units Subcutaneous QHS  . magnesium oxide  400 mg Oral Daily  . mouth rinse  15 mL Mouth Rinse q12n4p  . multivitamin with minerals  1 tablet Oral Daily  . polyethylene glycol  17 g Oral Daily  . potassium chloride  40 mEq Oral BID  . rosuvastatin  5 mg Oral Daily  . sacubitril-valsartan  1 tablet Oral BID  . sodium chloride flush  10-40 mL Intracatheter Q12H  . traZODone  50 mg Oral QHS   Continuous Infusions: .  ceFAZolin (ANCEF) IV       LOS: 12 days   Time spent: 25 minutes.  Patrecia Pour, MD [facility] [email or web address] 07/10/2020, 3:10 PM

## 2020-07-10 NOTE — Progress Notes (Signed)
Physical Therapy Treatment Patient Details Name: Tony Marshall MRN: [id number] DOB: April 01, 1941 Today's Date: 07/10/2020    History of Present Illness 79 y.o. male with a history of CLL, chronic combined CHF (Dx June 2021), T2DM, HTN, HLD, diverticulosis who presented to the ED 7/14 with hematochezia found to be in DKA and admitted to ICU on insulin infusion with resolution of DKA. Atrial fibrillation with RVR developed for which cardiology was consulted, started amiodarone IV. The hematochezia was thought to be due to fecal impaction with stercoral ulcer and resolved with conservative treatment including transfusions. Bowel regimen was started. Due to arm swelling, U/S was performed demonstrating an age-indeterminate subclavian DVT. Heparin was started without bolus with subsequent recurrence of GI bleeding. Heparin was stopped and GI performed flexible sigmoidoscopy limited by significant stool burden/fecal impaction. Incomplete evacuation was performed 7/21.  He also had protracted hospitalization recently from involving septic shock possibly due to RLL cellulitis initially requiring pressors, improved on ancef.    PT Comments    Pt remains weak and continues to fatigue quickly/easily with activity. He was barely able to walk ~10 feet on today. Wife was present during session. During session, she stated plan was for home and she was inquiring about a wheelchair. Based on today's performance, may need to consider ST SNF placement. Will continue to follow and progress activity as able.    Follow Up Recommendations  SNF (Home health PT;Supervision/Assistance - 24 hour if pt/family decline placement)     Equipment Recommendations  Wheelchair     Recommendations for Other Services       Precautions / Restrictions Precautions Precautions: Fall Restrictions Weight Bearing Restrictions: No    Mobility  Bed Mobility Overal bed mobility: Needs Assistance  Bed Mobility: Supine to Sit       Supine to sit: Min assist;HOB elevated     Assist for trunk and to scoot to EOB. Increased time. LOB x 1 posteriorly. Pt c/o some dizziness.   Transfers Overall transfer level: Needs assistance  Equipment used: Rolling walker (2 wheeled)  Transfers: Sit to/from Stand  Sit to Stand: Min assist         General transfer comment: Assist to power up, stabilize, control descent. Cues for safety, technique, hand placement.   Ambulation/Gait Ambulation/Gait assistance: Min assist; +2 safety/equipment Gait Distance (Feet): 7 Feet (x2) Assistive device: Rolling walker (2 wheeled) Gait Pattern/deviations: Decreased stride length;Trunk flexed;Decreased step length - right;Decreased step length - left     General Gait Details: Assist to stabilize pt throughout very short distance. Pt fatigues very easily/quickly. Followed closely with recliner. Transported back to room in recliner.    Stairs             Wheelchair Mobility    Modified Rankin (Stroke Patients Only)       Balance Overall balance assessment: Needs assistance (Simultaneous filing. User may not have seen previous data.) Sitting-balance support: Feet supported;Bilateral upper extremity supported Sitting balance-Leahy Scale: Fair     Standing balance support: Single extremity supported (Simultaneous filing. User may not have seen previous data.) Standing balance-Leahy Scale: Poor (Simultaneous filing. User may not have seen previous data.) Standing balance comment: Tremulous with bil knees buckling.  Pt took 2-3 latetarl steps then lowered to EOB for safety.             High level balance activites: Side stepping              Cognition Arousal/Alertness: Awake/alert Behavior During Therapy: Flat affect  Overall Cognitive Status: Within Functional Limits for tasks assessed Area of Impairment: Orientation                 Orientation Level:  (Grossly oriented. Not fully tested)           Problem Solving: Requires verbal cues        Exercises     General Comments        Pertinent Vitals/Pain Pain Assessment: Faces Faces Pain Scale: Hurts little more Pain Location: R LE with ambulation   Pain Intervention(s): Repositioned;Limited activity within patient's tolerance    Home Living                      Prior Function            PT Goals (current goals can now be found in the care plan section) Acute Rehab PT Goals Patient Stated Goal: Decreased pain and increased endurance Progress towards PT goals: Progressing toward goals    Frequency    Min 3X/week      PT Plan Current plan remains appropriate    Co-evaluation              AM-PAC PT "6 Clicks" Mobility   Outcome Measure  Help needed turning from your back to your side while in a flat bed without using bedrails?: A Little Help needed moving from lying on your back to sitting on the side of a flat bed without using bedrails?: A Little Help needed moving to and from a bed to a chair (including a wheelchair)?: A Little Help needed standing up from a chair using your arms (e.g., wheelchair or bedside chair)?: A Little Help needed to walk in hospital room?: A Lot Help needed climbing 3-5 steps with a railing? : Total 6 Click Score: 15    End of Session Equipment Utilized During Treatment: Gait belt Activity Tolerance: Patient limited by fatigue Patient left: in chair;with call bell/phone within reach;with family/visitor present   PT Visit Diagnosis: Muscle weakness (generalized) (M62.81);Difficulty in walking, not elsewhere classified (R26.2)     Time: 8366-2947 PT Time Calculation (min) (ACUTE ONLY): 23 min  Charges:  $Gait Training: 23-37 mins                         Doreatha Massed, PT Acute Rehabilitation  Office: [phone] Pager: [phone]

## 2020-07-10 NOTE — Progress Notes (Signed)
Occupational Therapy Treatment Patient Details Name: Tony Marshall MRN: [id number] DOB: Feb 04, 1941 Today's Date: 07/10/2020    History of present illness 79 y.o. male with a history of CLL, chronic combined CHF (Dx June 2021), T2DM, HTN, HLD, diverticulosis who presented to the ED 7/14 with hematochezia found to be in DKA and admitted to ICU on insulin infusion with resolution of DKA. Atrial fibrillation with RVR developed for which cardiology was consulted, started amiodarone IV. The hematochezia was thought to be due to fecal impaction with stercoral ulcer and resolved with conservative treatment including transfusions. Bowel regimen was started. Due to arm swelling, U/S was performed demonstrating an age-indeterminate subclavian DVT. Heparin was started without bolus with subsequent recurrence of GI bleeding. Heparin was stopped and GI performed flexible sigmoidoscopy limited by significant stool burden/fecal impaction. Incomplete evacuation was performed 7/21.  He also had protracted hospitalization recently from involving septic shock possibly due to RLL cellulitis initially requiring pressors, improved on ancef.   OT comments  Patient unable to demonstrate imrpovement today due to fatigue having recently returned to bed after PT and sitting up in chair.  Pt shows a regression with ADLs as evidenced by AM-PAC 6 click score falling from 14/24 to 12/24 today. Patient limited by generalized weakness, back and RLE pain and poor activity tolerance along with deficits noted below. Pt continues to demonstrate good rehab potential and would benefit from continued skilled OT to increase safety and independence with ADLs and functional transfers to allow pt to return home safely and reduce caregiver burden and fall risk.  Spoke with pt and spouse regarding discharge wishes, and spouse stated that they are now "leaning towards" an inpatient rehab facility. Agree with SNF due to recent regression and ongoing  weakness and high fall risk.    Follow Up Recommendations  SNF    Equipment Recommendations  Other (comment) (wife wants a wheelchair)    Recommendations for Other Services      Precautions / Restrictions Precautions Precautions: Fall Restrictions Weight Bearing Restrictions: No       Mobility Bed Mobility Overal bed mobility: Needs Assistance (Simultaneous filing. User may not have seen previous data.) Bed Mobility: Supine to Sit (Simultaneous filing. User may not have seen previous data.)     Supine to sit: Min assist;HOB elevated     General bed mobility comments: Pt shown log roll due to back pain. Verbal cues for sequencing for sidelying to sit with increased effort and Minimal assist.  Total assist of 2 people needed for supine posterior scoot for repositioning to address back pain.  Transfers Overall transfer level: Needs assistance (Simultaneous filing. User may not have seen previous data.) Equipment used: Rolling walker (2 wheeled) (Simultaneous filing. User may not have seen previous data.) Transfers: Sit to/from Stand (Simultaneous filing. User may not have seen previous data.) Sit to Stand: Min assist (Simultaneous filing. User may not have seen previous data.) Stand pivot transfers: Mod assist       General transfer comment: Assist to power up, stabilize, control descent. Cues for safety, technique, hand placement. (Simultaneous filing. User may not have seen previous data.)    Balance Overall balance assessment: Needs assistance (Simultaneous filing. User may not have seen previous data.) Sitting-balance support: Feet supported;Bilateral upper extremity supported Sitting balance-Leahy Scale: Fair     Standing balance support: Single extremity supported (Simultaneous filing. User may not have seen previous data.) Standing balance-Leahy Scale: Poor (Simultaneous filing. User may not have seen previous data.) Standing balance comment:  Tremulous with bil  knees buckling.  Pt took 2-3 latetarl steps then lowered to EOB for safety.             High level balance activites: Side stepping             ADL either performed or assessed with clinical judgement   ADL                         Lower Body Dressing Details (indicate cue type and reason): Please see mobility sectionf for pre-ADL mobility training.   Toilet Transfer Details (indicate cue type and reason): Please see mobility sectionf for pre-ADL mobility training.                                   Cognition Arousal/Alertness: Awake/alert Behavior During Therapy: Flat affect Overall Cognitive Status: Within Functional Limits for tasks assessed Area of Impairment: Orientation                 Orientation Level:  (Grossly oriented. Not fully tested) Current Attention Level: Sustained         Problem Solving: Requires verbal cues          Exercises General Exercises - Upper Extremity Shoulder Flexion: AROM (x5) General Exercises - Lower Extremity Ankle Circles/Pumps: AROM;10 reps;Supine;Both Quad Sets: AROM;Right;5 reps;Supine;Other (comment) (with 5 second holds each rep) Straight Leg Raises: AROM;5 reps;Both   Shoulder Instructions       General Comments      Pertinent Vitals/ Pain       Pain Assessment: Faces Faces Pain Scale: Hurts little more Pain Location: Lower back.  Denied RLE pain. Pain Intervention(s): Repositioned;Limited activity within patient's tolerance   Vitals:   07/10/20 0624 07/10/20 1411  BP: (!) 150/78 128/72  Pulse: 71 71  Resp: 19 16  Temp: 98.5 F (36.9 C) 97.9 F (36.6 C)  SpO2: 96% 97%  '                Frequency  Min 2X/week        Progress Toward Goals  OT Goals(current goals can now be found in the care plan section)  Progress towards OT goals: Not progressing toward goals - comment  Acute Rehab OT Goals Patient Stated Goal: Decreased pain and increased endurance OT Goal  Formulation: With patient/family Time For Goal Achievement: 08/24/20 Potential to Achieve Goals: Good  Plan Discharge plan needs to be updated (Pt's wife and pt state that they are now considering an inpatient rehab situation.  SNF is recommended if pt and spouse agree.)    Co-evaluation                 AM-PAC OT "6 Clicks" Daily Activity     Outcome Measure   Help from another person eating meals?: A Little Help from another person taking care of personal grooming?: A Lot Help from another person toileting, which includes using toliet, bedpan, or urinal?: Total (condom cath) Help from another person bathing (including washing, rinsing, drying)?: A Lot Help from another person to put on and taking off regular upper body clothing?: A Lot Help from another person to put on and taking off regular lower body clothing?: A Lot 6 Click Score: 12    End of Session    OT Visit Diagnosis: Unsteadiness on feet (R26.81);Other abnormalities of gait and mobility (R26.89);Muscle weakness (generalized) (M62.81);Other symptoms  and signs involving cognitive function   Activity Tolerance Patient limited by fatigue;Patient limited by pain   Patient Left with call bell/phone within reach;with family/visitor present;in chair;with bed alarm set   Nurse Communication Mobility status        Time: 1341-1401 OT Time Calculation (min): 20 min  Charges: OT Treatments $Therapeutic Activity: 8-22 mins  Anderson Malta, [place] Office: [phone] 07/10/2020    Julien Girt 07/10/2020, 2:19 PM

## 2020-07-11 ENCOUNTER — Inpatient Hospital Stay: Payer: Medicare HMO | Admitting: Internal Medicine

## 2020-07-11 ENCOUNTER — Inpatient Hospital Stay: Payer: Medicare HMO

## 2020-07-11 DIAGNOSIS — Z789 Other specified health status: Secondary | ICD-10-CM | POA: Diagnosis not present

## 2020-07-11 DIAGNOSIS — E1110 Type 2 diabetes mellitus with ketoacidosis without coma: Secondary | ICD-10-CM | POA: Diagnosis not present

## 2020-07-11 DIAGNOSIS — I480 Paroxysmal atrial fibrillation: Secondary | ICD-10-CM | POA: Diagnosis not present

## 2020-07-11 DIAGNOSIS — C9110 Chronic lymphocytic leukemia of B-cell type not having achieved remission: Secondary | ICD-10-CM | POA: Diagnosis not present

## 2020-07-11 LAB — GLUCOSE, CAPILLARY
Glucose-Capillary: 100 mg/dL — ABNORMAL HIGH (ref 70–99)
Glucose-Capillary: 150 mg/dL — ABNORMAL HIGH (ref 70–99)
Glucose-Capillary: 134 mg/dL — ABNORMAL HIGH (ref 70–99)

## 2020-07-11 LAB — MAGNESIUM: Magnesium: 1.7 mg/dL (ref 1.7–2.4)

## 2020-07-11 LAB — CBC
HCT: 29 % — ABNORMAL LOW (ref 39.0–52.0)
Hemoglobin: 9 g/dL — ABNORMAL LOW (ref 13.0–17.0)
MCH: 29.4 pg (ref 26.0–34.0)
MCHC: 31 g/dL (ref 30.0–36.0)
MCV: 94.8 fL (ref 80.0–100.0)
Platelets: 138 10*3/uL — ABNORMAL LOW (ref 150–400)
RBC: 3.06 MIL/uL — ABNORMAL LOW (ref 4.22–5.81)
RDW: 17.2 % — ABNORMAL HIGH (ref 11.5–15.5)
WBC: 2 10*3/uL — ABNORMAL LOW (ref 4.0–10.5)
nRBC: 0 % (ref 0.0–0.2)

## 2020-07-11 LAB — BASIC METABOLIC PANEL
Anion gap: 7 (ref 5–15)
BUN: 10 mg/dL (ref 8–23)
CO2: 29 mmol/L (ref 22–32)
Calcium: 8.1 mg/dL — ABNORMAL LOW (ref 8.9–10.3)
Chloride: 98 mmol/L (ref 98–111)
Creatinine, Ser: 0.52 mg/dL — ABNORMAL LOW (ref 0.61–1.24)
GFR calc Af Amer: >60 mL/min (ref >60)
GFR calc non Af Amer: >60 mL/min (ref >60)
Glucose, Bld: 153 mg/dL — ABNORMAL HIGH (ref 70–99)
Potassium: 4.2 mmol/L (ref 3.5–5.1)
Sodium: 134 mmol/L — ABNORMAL LOW (ref 135–145)

## 2020-07-11 MED ORDER — CEPHALEXIN 500 MG PO CAPS: 500.0000 mg | ORAL_CAPSULE | Freq: Four times a day (QID) | ORAL | 0 refills | Status: DC

## 2020-07-11 MED ORDER — GUAIFENESIN-DM 100-10 MG/5ML PO SYRP: 5.0000 mL | ORAL_SOLUTION | ORAL | 0 refills | Status: DC | PRN

## 2020-07-11 NOTE — Discharge Instructions (Signed)
Acute Respiratory Failure, Adult  Acute respiratory failure occurs when there is not enough oxygen passing from your lungs to your body. When this happens, your lungs have trouble removing carbon dioxide from the blood. This causes your blood oxygen level to drop too low as carbon dioxide builds up. Acute respiratory failure is a medical emergency. It can develop quickly, but it is temporary if treated promptly. Your lung capacity, or how much air your lungs can hold, may improve with time, exercise, and treatment. What are the causes? There are many possible causes of acute respiratory failure, including:  Lung injury.  Chest injury or damage to the ribs or tissues near the lungs.  Lung conditions that affect the flow of air and blood into and out of the lungs, such as pneumonia, acute respiratory distress syndrome, and cystic fibrosis.  Medical conditions, such as strokes or spinal cord injuries, that affect the muscles and nerves that control breathing.  Blood infection (sepsis).  Inflammation of the pancreas (pancreatitis).  A blood clot in the lungs (pulmonary embolism).  A large-volume blood transfusion.  Burns.  Near-drowning.  Seizure.  Smoke inhalation.  Reaction to medicines.  Alcohol or drug overdose. What increases the risk? This condition is more likely to develop in people who have:  A blocked airway.  Asthma.  A condition or disease that damages or weakens the muscles, nerves, bones, or tissues that are involved in breathing.  A serious infection.  A health problem that blocks the unconscious reflex that is involved in breathing, such as hypothyroidism or sleep apnea.  A lung injury or trauma. What are the signs or symptoms? Trouble breathing is the main symptom of acute respiratory failure. Symptoms may also include:  Rapid breathing.  Restlessness or anxiety.  Skin, lips, or fingernails that appear blue (cyanosis).  Rapid heart  rate.  Abnormal heart rhythms (arrhythmias).  Confusion or changes in behavior.  Tiredness or loss of energy.  Feeling sleepy or having a loss of consciousness. How is this diagnosed? Your health care provider can diagnose acute respiratory failure with a medical history and physical exam. During the exam, your health care provider will listen to your heart and check for crackling or wheezing sounds in your lungs. Your may also have tests to confirm the diagnosis and determine what is causing respiratory failure. These tests may include:  Measuring the amount of oxygen in your blood (pulse oximetry). The measurement comes from a small device that is placed on your finger, earlobe, or toe.  Other blood tests to measure blood gases and to look for signs of infection.  Sampling your cerebral spinal fluid or tracheal fluid to check for infections.  Chest X-ray to look for fluid in spaces that should be filled with air.  Electrocardiogram (ECG) to look at the heart's electrical activity. How is this treated? Treatment for this condition usually takes places in a hospital intensive care unit (ICU). Treatment depends on what is causing the condition. It may include one or more treatments until your symptoms improve. Treatment may include:  Supplemental oxygen. Extra oxygen is given through a tube in the nose, a face mask, or a hood.  A device such as a continuous positive airway pressure (CPAP) or bi-level positive airway pressure (BiPAP or BPAP) machine. This treatment uses mild air pressure to keep the airways open. A mask or other device will be placed over your nose or mouth. A tube that is connected to a motor will deliver oxygen through the   mask.  Ventilator. This treatment helps move air into and out of the lungs. This may be done with a bag and mask or a machine. For this treatment, a tube is placed in your windpipe (trachea) so air and oxygen can flow to the lungs.  Extracorporeal  membrane oxygenation (ECMO). This treatment temporarily takes over the function of the heart and lungs, supplying oxygen and removing carbon dioxide. ECMO gives the lungs a chance to recover. It may be used if a ventilator is not effective.  Tracheostomy. This is a procedure that creates a hole in the neck to insert a breathing tube.  Receiving fluids and medicines.  Rocking the bed to help breathing. Follow these instructions at home:  Take over-the-counter and prescription medicines only as told by your health care provider.  Return to normal activities as told by your health care provider. Ask your health care provider what activities are safe for you.  Keep all follow-up visits as told by your health care provider. This is important. How is this prevented? Treating infections and medical conditions that may lead to acute respiratory failure can help prevent the condition from developing. Contact a health care provider if:  You have a fever.  Your symptoms do not improve or they get worse. Get help right away if:  You are having trouble breathing.  You lose consciousness.  Your have cyanosis or turn blue.  You develop a rapid heart rate.  You are confused. These symptoms may represent a serious problem that is an emergency. Do not wait to see if the symptoms will go away. Get medical help right away. Call your local emergency services (911 in the [place] Do not drive yourself to the hospital. This information is not intended to replace advice given to you by your health care provider. Make sure you discuss any questions you have with your health care provider. Document Revised: 11/14/2017 Document Reviewed: 06/19/2016 Elsevier Patient Education  [place].   Aplastic Anemia Aplastic anemia occurs when soft tissue inside of bones (bone marrow) stops making enough blood cells. There are three types of blood cells:  Red blood cells. These carry oxygen to the tissues  of the body.  White blood cells. These fight infection.  Platelets. These help your blood to clot when you have an injury. Aplastic anemia is a rare and serious condition that may develop slowly or rapidly. Even after treatment, it is necessary to be monitored for problems that can come back (recur). What are the causes? This condition may be caused by anything that injures bone marrow, such as:  Radiation and chemotherapy treatment for cancer. These treatments are used to kill cancer cells, but they also damage healthy cells.  Exposure to poisonous (toxic) chemicals that are used in some pesticides and insecticides.  Certain medicines, such as medicines used to treat rheumatoid arthritis.  Conditions in which the body's disease-fighting system (immune system) attacks the body's own cells (autoimmune disorders).  Viral infections, including hepatitis.  Pregnancy. This is a rare cause of aplastic anemia, and it may be related to an autoimmune problem that affects bone marrow. Sometimes, the cause is not known. What are the signs or symptoms? Symptoms of this condition include:  Shortness of breath.  Fatigue.  Light-headedness or fainting.  Shortness of breath and rapid heart rate, especially with physical activity.  Pale skin and lips.  Frequent infections.  Bruising and bleeding easily.  Nosebleeds and bleeding gums.  Prolonged bleeding from cuts.  Severe  bleeding during menstrual periods in women.  Sore mouth.  Bacterial or fungal infections. How is this diagnosed? This condition is diagnosed based on:  Your symptoms.  Your medical history.  Blood tests.  Removal of a bone marrow sample (biopsy) for testing. You may have more tests to find the underlying cause of your aplastic anemia. How is this treated? Treatment for aplastic anemia depends on the severity of the condition. For mild cases, observation is needed. In severe cases, or if complications  develop, hospitalization may be necessary. Severe aplastic anemia is life-threatening. Treatment may include:  Receiving donated blood through an IV tube (blood transfusion).  Medicines: ? To reduce the activity of (suppress) the immune system, if you have an autoimmune disorder. ? To stimulate bone marrow to make more blood cells.  Antibiotic medicines to prevent infection.  Receiving healthy bone marrow from a donor (bone marrow transplant), if your condition is severe. After the transplant, you will need to take medicines to help prevent your body from rejecting the new marrow. If the procedure is successful, the transplanted marrow will begin to produce new blood cells. Your health care provider will determine whether you are a candidate for bone marrow transplant. Follow these instructions at home:  Medicines  Take over-the-counter and prescription medicines only as told by your health care provider.  If you were prescribed an antibiotic medicine, take it as told by your health care provider. Do not stop taking the antibiotic even if you start to feel better. General instructions  Get plenty of rest, and eat a healthy, well-balanced diet.  Avoid excessive exercise. Long-term anemia can put stress on the heart. Ask your health care provider what types of exercise are best for you.  When platelets are at low levels, avoid all activities that put you at risk for injury. This is important because your risk of bleeding is greater. Ask your health care provider what activities are safe for you when your platelets are low.  Protect yourself from infections: ? Wash your hands often with soap and water. If soap and water are not available, use hand sanitizer. ? Avoid crowds. ? Avoid contact with sick people.  Keep all follow-up visits as told by your health care provider. This is important. How is this prevented?  Avoid exposure to toxic chemicals such  as: ? Insecticides. ? Herbicides. ? Organic solvents. ? Paint removers.  Take steps to protect yourself from infections. Contact a health care provider if:  You develop mouth sores.  You have a fever or other symptoms that last for more than 2-3 days.  You develop flu-like symptoms.  You are bruising easily.  You develop signs of an infection.  You have blood in your urine or your stool (feces).  You are bleeding from your gums or nose.  You develop infections more often than usual. Get help right away if:  You have a fever and your symptoms suddenly get worse.  You have prolonged bleeding from cuts.  You have shortness of breath that gets worse.  You have chest pain or a rapid heart rate when you do physical activity.  You have increasing fatigue and tiredness.  You become light-headed or you faint.  You develop pale skin and lips.  You cough up blood. Summary  Aplastic anemia occurs when soft tissue inside of the bones (bone marrow) stops making enough blood cells.  Treatment for aplastic anemia depends on the severity of the condition, and it may involve medicines or  transfusions.  In severe cases, or if complications develop, hospitalization may be necessary. This information is not intended to replace advice given to you by your health care provider. Make sure you discuss any questions you have with your health care provider. Document Revised: 11/14/2017 Document Reviewed: 07/29/2016 Elsevier Patient Education  [place].   Bowel Obstruction A bowel obstruction is a blockage in the small or large bowel. The bowel, which is also called the intestine, is a long, slender tube that connects the stomach to the anus. When a person eats and drinks, food and fluids go from the mouth to the stomach to the small bowel. This is where most of the nutrients in the food and fluids are absorbed. After the small bowel, material passes through the large bowel for  further absorption until any leftover material leaves the body as stool through the anus during a bowel movement. A bowel obstruction will prevent food and fluids from passing through the bowel as they normally do during digestion. The bowel can become partially or completely blocked. If this condition is not treated, it can be dangerous because the bowel could rupture. What are the causes? Common causes of this condition include:  Scar tissue (adhesions) from previous surgery or treatment with high-energy X-rays (radiation).  Recent surgery. This may cause the movements of the bowel to slow down and cause food to block the intestine.  Inflammatory bowel disease, such as Crohn's disease or diverticulitis.  Growths or tumors.  A bulging organ (hernia).  Twisting of the bowel (volvulus).  A foreign body.  Slipping of a part of the bowel into another part (intussusception). What are the signs or symptoms? Symptoms of this condition include:  Pain in the abdomen. Depending on the degree of obstruction, pain may be: ? Mild or severe. ? Dull cramping or sharp pain. ? In one area or in the entire abdomen.  Nausea and vomiting. Vomit may be greenish or a yellow bile color.  Bloating in the abdomen.  Difficulty passing stool (constipation).  Lack of passing gas.  Frequent belching.  Diarrhea. This may occur if the obstruction is partial and runny stool is able to leak around the obstruction. How is this diagnosed? This condition may be diagnosed based on:  A physical exam.  Medical history.  Imaging tests of the abdomen or pelvis, such as X-ray or CT scan.  Blood or urine tests. How is this treated? Treatment for this condition depends on the cause and severity of the problem. Treatment may include:  Fluids and pain medicines that are given through an IV. Your health care provider may instruct you not to eat or drink if you have nausea or vomiting.  Eating a simple diet.  You may be asked to consume a clear liquid diet for several days. This allows the bowel to rest.  Placement of a small tube (nasogastric tube) into the stomach. This will relieve pain, discomfort, and nausea by removing blocked air and fluids from the stomach. It can also help the obstruction clear up faster.  Surgery. This may be required if other treatments do not work. Surgery may be required for: ? Bowel obstruction from a hernia. This can be an emergency procedure. ? Scar tissue that causes frequent or severe obstructions. Follow these instructions at home: Medicines  Take over-the-counter and prescription medicines only as told by your health care provider.  If you were prescribed an antibiotic medicine, take it as told by your health care provider. Do  not stop taking the antibiotic even if you start to feel better. General instructions  Follow instructions from your health care provider about eating restrictions. You may need to avoid solid foods and consume only clear liquids until your condition improves.  Return to your normal activities as told by your health care provider. Ask your health care provider what activities are safe for you.  Avoid sitting for a long time without moving. Get up to take short walks every 1-2 hours. This is important to improve blood flow and breathing. Ask for help if you feel weak or unsteady.  Keep all follow-up visits as told by your health care provider. This is important. How is this prevented? After having a bowel obstruction, you are more likely to have another. You may do the following things to prevent another obstruction:  If you have a long-term (chronic) disease, pay attention to your symptoms and contact your health care provider if you have questions or concerns.  Avoid becoming constipated. To prevent or treat constipation, your health care provider may recommend that you: ? Drink enough fluid to keep your urine pale yellow. ? Take  over-the-counter or prescription medicines. ? Eat foods that are high in fiber, such as beans, whole grains, and fresh fruits and vegetables. ? Limit foods that are high in fat and processed sugars, such as fried or sweet foods.  Stay active. Exercise for 30 minutes or more, 5 or more days each week. Ask your health care provider which exercises are safe for you.  Avoid stress. Find ways to reduce stress, such as meditation, exercise, or taking time for activities that relax you.  Instead of eating three large meals each day, eat three small meals with three small snacks.  Work with a Microbiologist to make a healthy meal plan that works for you.  Do not use any products that contain nicotine or tobacco, such as cigarettes and e-cigarettes. If you need help quitting, ask your health care provider. Contact a health care provider if you:  Have a fever.  Have chills. Get help right away if you:  Have increased pain or cramping.  Vomit blood.  Have uncontrolled vomiting or nausea.  Cannot drink fluids because of vomiting or pain.  Become confused.  Begin feeling very thirsty (dehydrated).  Have severe bloating.  Feel extremely weak or you faint. Summary  A bowel obstruction is a blockage in the small or large bowel.  A bowel obstruction will prevent food and fluids from passing through the bowel as they normally do during digestion.  Treatment for this condition depends on the cause and severity of the problem. It may include fluids and pain medicines through an IV, a simple diet, a nasogastric tube, or surgery.  Follow instructions from your health care provider about eating restrictions. You may need to avoid solid foods and consume only clear liquids until your condition improves. This information is not intended to replace advice given to you by your health care provider. Make sure you discuss any questions you have with your health care provider. Document Revised: 01/08/2019  Document Reviewed: 04/15/2018 Elsevier Patient Education  [place].   Anemia  Anemia is a condition in which you do not have enough red blood cells or hemoglobin. Hemoglobin is a substance in red blood cells that carries oxygen. When you do not have enough red blood cells or hemoglobin (are anemic), your body cannot get enough oxygen and your organs may not work properly. As a result,  you may feel very tired or have other problems. What are the causes? Common causes of anemia include:  Excessive bleeding. Anemia can be caused by excessive bleeding inside or outside the body, including bleeding from the intestine or from periods in women.  Poor nutrition.  Long-lasting (chronic) kidney, thyroid, and liver disease.  Bone marrow disorders.  Cancer and treatments for cancer.  HIV (human immunodeficiency virus) and AIDS (acquired immunodeficiency syndrome).  Treatments for HIV and AIDS.  Spleen problems.  Blood disorders.  Infections, medicines, and autoimmune disorders that destroy red blood cells. What are the signs or symptoms? Symptoms of this condition include:  Minor weakness.  Dizziness.  Headache.  Feeling heartbeats that are irregular or faster than normal (palpitations).  Shortness of breath, especially with exercise.  Paleness.  Cold sensitivity.  Indigestion.  Nausea.  Difficulty sleeping.  Difficulty concentrating. Symptoms may occur suddenly or develop slowly. If your anemia is mild, you may not have symptoms. How is this diagnosed? This condition is diagnosed based on:  Blood tests.  Your medical history.  A physical exam.  Bone marrow biopsy. Your health care provider may also check your stool (feces) for blood and may do additional testing to look for the cause of your bleeding. You may also have other tests, including:  Imaging tests, such as a CT scan or MRI.  Endoscopy.  Colonoscopy. How is this treated? Treatment for  this condition depends on the cause. If you continue to lose a lot of blood, you may need to be treated at a hospital. Treatment may include:  Taking supplements of iron, vitamin A91, or folic acid.  Taking a hormone medicine (erythropoietin) that can help to stimulate red blood cell growth.  Having a blood transfusion. This may be needed if you lose a lot of blood.  Making changes to your diet.  Having surgery to remove your spleen. Follow these instructions at home:  Take over-the-counter and prescription medicines only as told by your health care provider.  Take supplements only as told by your health care provider.  Follow any diet instructions that you were given.  Keep all follow-up visits as told by your health care provider. This is important. Contact a health care provider if:  You develop new bleeding anywhere in the body. Get help right away if:  You are very weak.  You are short of breath.  You have pain in your abdomen or chest.  You are dizzy or feel faint.  You have trouble concentrating.  You have bloody or black, tarry stools.  You vomit repeatedly or you vomit up blood. Summary  Anemia is a condition in which you do not have enough red blood cells or enough of a substance in your red blood cells that carries oxygen (hemoglobin).  Symptoms may occur suddenly or develop slowly.  If your anemia is mild, you may not have symptoms.  This condition is diagnosed with blood tests as well as a medical history and physical exam. Other tests may be needed.  Treatment for this condition depends on the cause of the anemia. This information is not intended to replace advice given to you by your health care provider. Make sure you discuss any questions you have with your health care provider. Document Revised: 11/14/2017 Document Reviewed: 01/03/2017 Elsevier Patient Education  [place].   Bowel Obstruction A bowel obstruction means that something  is blocking the small or large bowel. The bowel is also called the intestine. It is the  long tube that connects the stomach to the opening of the butt (anus). When something blocks the bowel, food and fluids cannot pass through like normal. This condition needs to be treated. Treatment depends on the cause of the problem and how bad the problem is. What are the causes? Common causes of this condition include:  Scar tissue (adhesions) from past surgery or from high-energy X-rays (radiation).  Recent surgery in the belly. This affects how food moves in the bowel.  Some diseases, such as: ? Irritation of the lining of the digestive tract (Crohn's disease). ? Irritation of small pouches in the bowel (diverticulitis).  Growths or tumors.  A bulging organ (hernia).  Twisting of the bowel (volvulus).  A foreign body.  Slipping of a part of the bowel into another part (intussusception). What are the signs or symptoms? Symptoms of this condition include:  Pain in the belly.  Feeling sick to your stomach (nauseous).  Throwing up (vomiting).  Bloating in the belly.  Being unable to pass gas.  Trouble pooping (constipation).  Watery poop (diarrhea).  A lot of belching. How is this diagnosed? This condition may be diagnosed based on:  A physical exam.  Medical history.  Imaging tests, such as X-ray or CT scan.  Blood tests.  Urine tests. How is this treated? Treatment for this condition may include:  Fluids and pain medicines that are given through an IV tube. Your doctor may tell you not to eat or drink if you feel sick to your stomach and are throwing up.  Eating a clear liquid diet for a few days.  Putting a small tube (nasogastric tube) into the stomach. This will help with pain, discomfort, and nausea by removing blocked air and fluids from the stomach.  Surgery. This may be needed if other treatments do not work. Follow these instructions at  home: Medicines  Take over-the-counter and prescription medicines only as told by your doctor.  If you were prescribed an antibiotic medicine, take it as told by your doctor. Do not stop taking the antibiotic even if you start to feel better. General instructions  Follow your diet as told by your doctor. You may need to: ? Only drink clear liquids until you start to get better. ? Avoid solid foods.  Return to your normal activities as told by your doctor. Ask your doctor what activities are safe for you.  Do not sit for a long time without moving. Get up to take short walks every 1-2 hours. This is important. Ask for help if you feel weak or unsteady.  Keep all follow-up visits as told by your doctor. This is important. How is this prevented? After having a bowel obstruction, you may be more likely to have another. You can do some things to stop it from happening again.  If you have a long-term (chronic) disease, contact your doctor if you see changes or problems.  Take steps to prevent or treat trouble pooping. Your doctor may ask that you: ? Drink enough fluid to keep your pee (urine) pale yellow. ? Take over-the-counter or prescription medicines. ? Eat foods that are high in fiber. These include beans, whole grains, and fresh fruits and vegetables. ? Limit foods that are high in fat and sugar. These include fried or sweet foods.  Stay active. Ask your doctor which exercises are safe for you.  Avoid stress.  Eat three small meals and three small snacks each day.  Work with a Publishing rights manager (dietitian) to  make a meal plan that works for you.  Do not use any products that contain nicotine or tobacco, such as cigarettes and e-cigarettes. If you need help quitting, ask your doctor. Contact a doctor if:  You have a fever.  You have chills. Get help right away if:  You have pain or cramps that get worse.  You throw up blood.  You are sick to your stomach.  You cannot stop  throwing up.  You cannot drink fluids.  You feel mixed up (confused).  You feel very thirsty (dehydrated).  Your belly gets more bloated.  You feel weak or you pass out (faint). Summary  A bowel obstruction means that something is blocking the small or large bowel.  Treatment may include IV fluids and pain medicine. You may also have a clear liquid diet, a small tube in your stomach, or surgery.  Drink clear liquids and avoid solid foods until you get better. This information is not intended to replace advice given to you by your health care provider. Make sure you discuss any questions you have with your health care provider. Document Revised: 04/15/2018 Document Reviewed: 04/15/2018 Elsevier Patient Education  2020 [employer].   Chronic Constipation  Chronic constipation is a condition in which a person has three or fewer bowel movements a week, for three months or longer. This condition is especially common in older adults. The two main kinds of chronic constipation are secondary constipation and functional constipation. Secondary constipation results from another condition or a treatment. Functional constipation, also called primary or idiopathic constipation, is divided into three types:  Normal transit constipation. In this type, movement of stool through the colon (stool transit) occurs normally.  Slow transit constipation. In this type, stool moves slowly through the colon.  Outlet constipation or pelvic floor dysfunction. In this type, the nerves and muscles that empty the rectum do not work normally. What are the causes? Causes of secondary constipation may include:  Failing to drink enough fluid, eat enough food or fiber, or get physically active.  Pregnancy.  A tear in the anus (anal fissure).  Blockage in the bowel (bowel obstruction).  Narrowing of the bowel (bowel stricture).  Having a long-term medical condition, such  as: ? Diabetes. ? Hypothyroidism. ? Multiple sclerosis. ? Parkinson disease. ? Stroke. ? Spinal cord injury. ? Dementia. ? Colon cancer. ? Inflammatory bowel disease (IBD). ? Iron-deficiency anemia. ? Outward collapse of the rectum (rectal prolapse). ? Hemorrhoids.  Taking certain medicines, including: ? Narcotics. These are a certain type of prescription pain medicine. ? Antacids. ? Iron supplements. ? Water pills (diuretics). ? Certain blood pressure medicines. ? Anti-seizure medicines. ? Antidepressants. ? Medicines for Parkinson disease. The cause of functional constipation is not known, but some conditions are associated with it. These conditions include:  Stress.  Problems in the nerves and muscles that control stool transit.  Weak or impaired pelvic floor muscles. What increases the risk? You may be at higher risk for chronic constipation if you:  Are older than age 20.  Are male.  Live in a long-term care facility.  Do not get much exercise or physical activity (have a sedentary lifestyle).  Do not drink enough fluids.  Do not eat enough food, especially fiber.  Have a long-term disease.  Have a mental health disorder or eating disorder.  Take many medicines. What are the signs or symptoms? The main symptom of chronic constipation is having three or fewer bowel movements a week for several  weeks. Other signs and symptoms may vary from person to person. These include:  Pushing hard (straining) to pass stool.  Painful bowel movements.  Having hard or lumpy stools.  Having lower belly discomfort, such as cramps or bloating.  Being unable to have a bowel movement when you feel the urge.  Feeling like you still need to pass stool after a bowel movement.  Feeling that you have something in your rectum that is blocking or preventing bowel movements.  Seeing blood on the toilet paper or in your stool.  Worsening confusion (in older adults). How  is this diagnosed? This condition may be diagnosed based on:  Symptoms and medical history. You will be asked about your symptoms, lifestyle, diet, and any medicines that you are taking.  Physical exam. ? Your belly (abdomen) will be examined. ? A digital rectal exam may be done. For this exam, a health care provider places a lubricated, gloved finger into the rectum.  Other tests to check for any underlying causes of your constipation. These may be ordered if you have bleeding in your rectum, weight loss, or a family history of colon cancer. In these cases, you may have: ? Imaging studies of the colon. These may include X-ray, ultrasound, or CT scan. ? Blood tests. ? A procedure to examine the inside of your colon (colonoscopy). ? More specialized tests to check:  Whether your anal sphincter works well. This is a ring-shaped muscle that controls the closing of the anus.  How well food moves through your colon. ? Tests to measure the nerve signal in your pelvic floor muscles (electromyography). How is this treated? Treatment for chronic constipation depends on the cause. Most often, treatment starts with:  Being more active and getting regular exercise.  Drinking more fluids.  Adding fiber to your diet. Sources of fiber include fruits, vegetables, whole grains, and fiber supplements.  Using medicines such as stool softeners or medicines that increase contractions in your digestive system (pro-motility agents).  Training your pelvic muscles with biofeedback.  Surgery, if there is obstruction. Treatment for secondary chronic constipation depends on the underlying condition. You may need to:  Stop or change some medicines if they cause constipation.  Use a fiber supplement (bulk laxative) or stool softener.  Use prescription laxative. This works by [employer] into your colon (osmotic laxative). You may also need to see a specialist who treats conditions of the digestive  system (gastroenterologist). Follow these instructions at home:   Take over-the-counter and prescription medicines only as told by your health care provider.  If you are taking a laxative, take it as told by your health care provider.  Eat a balanced diet that includes enough fiber. Ask your health care provider to recommend a diet that is right for you.  Drink clear fluids, especially water. Avoid drinking alcohol, caffeine, and soda.  Drink enough fluid to keep your urine pale yellow.  Get some physical activity every day. Ask your health care provider what physical activities are safe for you.  Get colon cancer screenings as told by your health care provider.  Keep all follow-up visits as told by your health care provider. This is important. Contact a health care provider if:  You are having three or fewer bowel movements a week.  Your stools are hard or lumpy.  You notice blood on the toilet paper or in your stool after you have a bowel movement.  You have unexplained weight loss.  You have rectum (rectal) pain.  You have stool leakage.  You experience nausea or vomiting. Get help right away if:  You have rectal bleeding or you pass blood clots.  You have severe rectal pain.  You have body tissue that pushes out (protrudes) from your anus.  You have severe pain or bloating (distension) in your abdomen.  You have vomiting that you cannot control. Summary  Chronic constipation is a condition in which a person has three or fewer bowel movements a week, for three months or longer.  You may have a higher risk for this condition if you are an older adult, or if you do not drink enough water or get enough physical activity (are sedentary).  Treatment for this condition depends on the cause. Most treatments for chronic constipation include adding fiber to your diet, drinking more fluids, and getting more physical activity. You may also need to treat any underlying  medical conditions or stop or change certain medicines if they cause constipation.  If lifestyle changes do not relieve constipation, your health care provider may recommend taking a laxative. This information is not intended to replace advice given to you by your health care provider. Make sure you discuss any questions you have with your health care provider. Document Revised: 11/14/2017 Document Reviewed: 08/19/2017 Elsevier Patient Education  [place].

## 2020-07-11 NOTE — TOC Transition Note (Signed)
Transition of Care [facility]) - CM/SW Discharge Note   Patient Details  Name: Tony Marshall MRN: [id number] Date of Birth: 03/24/1941  Transition of Care [facility] CM/SW Contact:  Ross Ludwig, LCSW Phone Number: 07/11/2020, 12:55 PM   Clinical Narrative:     Patient will be going home with home health through [facility].  CSW signing off please reconsult with any other social work needs, home health agency has been notified of planned discharge today.  Family transporting him home.    Final next level of care: [place] Barriers to Discharge: Barriers Resolved   Patient Goals and CMS Choice Patient states their goals for this hospitalization and ongoing recovery are:: To return back home with home health services. CMS Medicare.gov Compare Post Acute Care list provided to:: Patient Represenative (must comment) Choice offered to / list presented to : Adult Children  Discharge Placement  Discharging home with home health.                  Patient and family notified of of transfer: 07/11/20  Discharge Plan and Services                DME Arranged: High strength lightweight manual wheelchair with seat cushion DME Agency: NA Date DME Agency Contacted: 07/08/20 Time DME Agency Contacted: [phone] Representative spoke with at DME Agency: [place]: RN, PT, OT, Nurse's Aide, Social Work [employer] Agency: [facility] [place] Date [place]: 07/11/20 Time [place]: [phone] Representative spoke with at [place]: [place] ([place]) Interventions     Readmission Risk Interventions No flowsheet data found.

## 2020-07-11 NOTE — Discharge Summary (Signed)
Physician Discharge Summary  Tony Marshall RDE:[id number] DOB: 1941-10-15 DOA: 06/28/2020  PCP: Elayne Snare, MD  Admit date: 06/28/2020 Discharge date: 07/11/2020  Admitted From: Home Disposition: Home   Recommendations for Outpatient Follow-up:  1. Follow up with oncology in the next week with repeat CBC 2. Follow up with cardiology for atrial fibrillation, CHF. 3. Prevent constipation/recurrent fecal impaction. 4. Note AFib with elevated CHA2DS2-VASc score and age-indeterminate right subclavian DVT, though unable to put on anticoagulation due to receurrent rectal bleeding from stercoral ulcer. Continue risk/benefit discussions in this regard.  5. Close PCP follow up. Patient discharged home for the 2nd time despite recommendations for SNF. [facility] home health is ordered at discharge.   Home Health: PT, OT, RN, aide, CSW Equipment/Devices: Wheelchair provided prior to discharge Discharge Condition: Stable CODE STATUS: Full Diet recommendation: Heart healthy, carb-modified   Brief/Interim Summary: Tony Marshall is a 79 y.o. male with a history of CLL, chronic combined CHF (Dx June 2021), T2DM, HTN, HLD, diverticulosis who presented to the ED 7/14 with hematochezia found to be in DKA and admitted to ICU on insulin infusion with resolution of DKA. Atrial fibrillation with RVR developed for which cardiology was consulted, started amiodarone IV. The hematochezia was thought to be due to fecal impaction with stercoral ulcer and resolved with conservative treatment including transfusions. Bowel regimen was started. Due to arm swelling, U/S was performed demonstrating an age-indeterminate subclavian DVT. Heparin was started without bolus with subsequent recurrence of GI bleeding. Heparin was stopped and GI performed flexible sigmoidoscopy limited by significant stool burden/fecal impaction. Incomplete evacuation was performed 7/21.Bowel program has been augmented with significant liquid stool  output. Repeat DRE 7/23 did not reveal any palpable stool and XR did not demonstrate significant stool burden or impaction. The patient developed a cough with retrocardiac opacity on CXR and Klebsiella growing on sputum culture for which antibiotics were started with improvement. Hgb remained stable, up to 9g/dl on day of discharge with improvement in rectal bleeding. SNF is recommended, though patient and his wife again prefer to discharge home. [facility] home health is being arranged.   Discharge Diagnoses:  Active Problems:   Essential hypertension   Pure hypercholesterolemia   CLL (chronic lymphocytic leukemia) (HCC)   Increased anion gap metabolic acidosis   Symptomatic anemia   Hyperglycemia due to diabetes mellitus (HCC)   DKA (diabetic ketoacidoses) (HCC)   Systolic and diastolic CHF, chronic (HCC)   Fecal impaction (HCC)   Overflow diarrhea   Rectal bleeding   Lower GI bleed   Central venous catheter in place   Shock circulatory ([place])   Prolonged QT interval   Atrial fibrillation (HCC)   Stercoral ulcer of rectum  Acute blood loss anemia due to GI bleeding due to stercoral ulcer due to fecal impaction:  - s/p 2u PRBCs 7/17. Hgb down 10.1 > 9g/dl with continued mild intermittent bleeding. Goal hgb 8g/dl or higher.  - Consider carafate enema BID if not healing as expected. - Holding anticoagulation. GI believes continued risk of hemorrhage is very high while ulcer is present.  - Prevent constipation/re-impaction with daily miralax (decrease to once daily and hold psyllium for now). Has had manual disimpaction at time of sigmoidoscopy and no evidence of impaction on subsequent DRE or XR.   Klebsiella pneumonia of LLL: Retrocardiac opacity is infiltrate vs. atelectasis, likely both. - Continue antitussive regimen - Continue keflex - Incentive spirometry, OOB - Recommend repeat CXR at follow up.   Age-indeterminate right subclavian DVT: Likely  related to PICC that was in place  during previous hospitalization (6/21 - 7/6). - Unable to anticoagulate at this time. Discussed risks and benefits w/family.   DKA in T2DM: Resolved s/p IV insulin. HbA1c 7.7% last month. DM followed by Dr. Dwyane Dee, Dx 1992. - Continue home Tx  New onset atrial fibrillation with RVR:  - Keep K, Mg replete. TSH 3.521.  - Continue amiodarone 264m qd. - Continue coreg. Avoid CCB's w/LV systolic dysfunction. - [id number]-GEXBscoreis 6 (CHF, HTN, age x2, T2DM, vascular calcifications by imaging), though holding anticoagulation in setting of GI bleed as above.   - Will need cardiology follow up.  Chronic combined HFrEF: Roughly euvolemic on exam, though I/O shows net positive. Echo 06/07/2020 w/LVEF 25-30%, G2DD, RV appears normal in structure and function.  - Continue entresto, dose per cardiology. Cr, BP stable.  - No current need for diuretic.  - RHC/LHC was planned once medically stabilized following recent hospitalization for septic shock.  CLL: Dx Oct 2014, Tx initiated 04/04/2020 with bendamustine/rituximab now s/p 3 cycles (last 6/15) and plan seems to be for observation. Tolerates chemo poorly w/fatigue, weakness, weight loss, poor appetite, etc.  - Pancytopenia possibly related to chemotherapy. Follow up with oncology in the next week.  Severe protein calorie malnutrition: Continues to have very minimal per oral intake. No related to nausea, vomiting, abdominal pain. Not related to any diet restrictions (there are none). It has persisted since chemotherapy.  - Supplement protein, unrestrict diet. Offered cortrak and tube feeding though the patient would like to defer for now and is tolerating protein supplements. Albumin 1.9.  OSA:  - CPAP qHS encouraged  Diffuse atherosclerosis of coronary arteries noted on imaging: No chest pain.  - On beta blocker, statin. No aspirin or anticoagulation at this time.   Prolonged QT interval: Stable, chronic.  - Minimize provocative agents.    Acute metabolic encephalopathy: Improved.   HTN:  - Cardiac medications as above  HLD:  - Continue statin  Hypokalemia: - Supplement and monitor   Discharge Instructions Discharge Instructions    (HEART FAILURE PATIENTS) Call MD:  Anytime you have any of the following symptoms: 1) 3 pound weight gain in 24 hours or 5 pounds in 1 week 2) shortness of breath, with or without a dry hacking cough 3) swelling in the hands, feet or stomach 4) if you have to sleep on extra pillows at night in order to breathe.   Complete by: As directed    Call MD for:  difficulty breathing, headache or visual disturbances   Complete by: As directed    Call MD for:  persistant nausea and vomiting   Complete by: As directed    Call MD for:  temperature >100.4   Complete by: As directed    Diet - low sodium heart healthy   Complete by: As directed    Discharge instructions   Complete by: As directed    Take keflex for 4 more days to complete treatment for pneumonia.  Keep regular bowel movements ~1 per day with miralax once daily. Either hold or add another dose to maintain soft bowel movements.   Take ensure or another protein supplement regularly to prevent malnutrition  Follow up with Dr. MJulien Nordmannin the next week for repeat labwork.  An appointment has been scheduled with the cardiologist on Aug 10. Their recommendations are to take amiodarone to regulate heart rate 206mdaily and to take entresto dose as prescribed.   Since your risk of  bleeding from the ulcer is so high right now, we are not recommending a blood thinner. Atrial fibrillation (the irregular heart rate) puts you at a higher risk of stroke and you have a blood clot in the right subclavian vein, so a blood thinner will eventually need to be started, though stability of your blood counts will need to be confirmed prior to this.   Increase activity slowly   Complete by: As directed      Allergies as of 07/11/2020      Reactions    Glycopyrrolate Rash      Medication List    STOP taking these medications   aspirin 81 MG EC tablet   benazepril 20 MG tablet Commonly known as: LOTENSIN   diphenoxylate-atropine 2.5-0.025 MG tablet Commonly known as: LOMOTIL   loperamide 2 MG tablet Commonly known as: IMODIUM A-D   metFORMIN 500 MG 24 hr tablet Commonly known as: GLUCOPHAGE-XR   metroNIDAZOLE 250 MG tablet Commonly known as: Flagyl   prochlorperazine 10 MG tablet Commonly known as: COMPAZINE     TAKE these medications   acetaminophen 500 MG tablet Commonly known as: TYLENOL Take 500 mg by mouth every 6 (six) hours as needed for moderate pain.   allopurinol 100 MG tablet Commonly known as: Zyloprim Take 1 tablet (100 mg total) by mouth 2 (two) times daily.   ALPRAZolam 0.25 MG tablet Commonly known as: XANAX Take 1 tablet (0.25 mg total) by mouth at bedtime as needed for anxiety.   amiodarone 200 MG tablet Commonly known as: PACERONE Take 1 tablet (200 mg total) by mouth daily.   carvedilol 12.5 MG tablet Commonly known as: COREG Take 1 tablet (12.5 mg total) by mouth 2 (two) times daily with a meal.   cephALEXin 500 MG capsule Commonly known as: KEFLEX Take 1 capsule (500 mg total) by mouth 4 (four) times daily.   dronabinol 2.5 MG capsule Commonly known as: MARINOL Take 1 capsule (2.5 mg total) by mouth 2 (two) times daily before a meal. What changed:   when to take this  reasons to take this   esomeprazole 40 MG capsule Commonly known as: NEXIUM TAKE 1 CAPSULE BY MOUTH EVERY MORNING 30 MINUTES BEFORE BREAKFAST What changed: See the new instructions.   feeding supplement (ENSURE ENLIVE) Liqd Take 237 mLs by mouth 3 (three) times daily between meals. What changed: when to take this   folic acid 1 MG tablet Commonly known as: FOLVITE Take 1 tablet (1 mg total) by mouth daily.   Glucagon Emergency 1 MG Kit Inject 1 mg into the skin once as needed (For severe low blood  sugar).   guaiFENesin-dextromethorphan 100-10 MG/5ML syrup Commonly known as: ROBITUSSIN DM Take 5 mLs by mouth every 4 (four) hours as needed for cough (chest congestion).   NovoLOG FlexPen 100 UNIT/ML FlexPen Generic drug: insulin aspart Inject 3-5 Units into the skin 3 (three) times daily with meals. Depending on the blood sugar level.   OneTouch Verio test strip Generic drug: glucose blood USE 1 STRIP TO CHECK GLUCOSE THREE TIMES DAILY   oxyCODONE 5 MG immediate release tablet Commonly known as: Oxy IR/ROXICODONE Take 1 tablet (5 mg total) by mouth every 4 (four) hours as needed for severe pain.   oxymetazoline 0.05 % nasal spray Commonly known as: AFRIN Place 1 spray into both nostrils 2 (two) times daily as needed for congestion.   pioglitazone 15 MG tablet Commonly known as: ACTOS Take 15 mg by mouth daily.  polyethylene glycol 17 g packet Commonly known as: MIRALAX / GLYCOLAX Take 17 g by mouth daily. hold if having multiple loose stools per day   potassium chloride SA 20 MEQ tablet Commonly known as: KLOR-CON Take 1 tablet (20 mEq total) by mouth 2 (two) times daily. What changed: when to take this   rosuvastatin 5 MG tablet Commonly known as: CRESTOR TAKE 1 TABLET BY MOUTH EVERY DAY   sacubitril-valsartan 24-26 MG Commonly known as: ENTRESTO Take 2 tablets by mouth 2 (two) times daily. What changed: how much to take   senna 8.6 MG Tabs tablet Commonly known as: SENOKOT Take 1 tablet (8.6 mg total) by mouth daily.   thiamine 100 MG tablet Take 1 tablet (100 mg total) by mouth daily.   traMADol 50 MG tablet Commonly known as: ULTRAM Take 1 tablet (50 mg total) by mouth every 12 (twelve) hours as needed. What changed: reasons to take this   Tresiba FlexTouch 100 UNIT/ML FlexTouch Pen Generic drug: insulin degludec inject 7 units daily. if fasting blood sugars are over 140 increase dosage up to 15 units daily as directed. What changed:   how much to  take  how to take this  when to take this  additional instructions       Follow-up Information    Skeet Latch, MD Follow up.   Specialty: Cardiology Why: [place] location - we have scheduled you for a follow-up appointment on Tuesday July 25, 2020 at 8:20 AM (Arrive by 8:05 AM). Contact information: [address] [place] [address] [phone]        Elayne Snare, MD. Schedule an appointment as soon as possible for a visit.   Specialty: Endocrinology Contact information: [place] [address] [phone]        Curt Bears, MD. Go on 07/11/2020.   Specialty: Oncology Contact information: [address] [place] [address] [phone]              Allergies  Allergen Reactions  . Glycopyrrolate Rash    Consultations:  Cardiology  Oncology  GI  Procedures/Studies: DG Chest 2 View  Result Date: 07/09/2020 CLINICAL DATA:  Pneumonia. EXAM: CHEST - 2 VIEW COMPARISON:  07/08/2020 FINDINGS: Lungs are somewhat hypoinflated demonstrate hazy opacification over the medial right base and left retrocardiac region likely representing layering small to moderate effusions as seen on the lateral film with associated basilar atelectasis. Small nodule opacity over the right mid lung not seen previously. Cardiomediastinal silhouette and remainder of the exam is unchanged. IMPRESSION: Small to moderate bilateral layering pleural effusions likely with associated bibasilar atelectasis. 2. Small nodular density projects over the right midlung. Recommend follow-up PA chest radiograph. If this persists, then CT chest is recommended on elective basis. Electronically Signed   By: Marin Olp M.D.   On: 07/09/2020 14:40   DG Lumbar Spine 2-3 Views  Result Date: 06/27/2020 CLINICAL DATA:  Low back pain.  No known injury. EXAM: LUMBAR SPINE - 2-3 VIEW COMPARISON:  CT chest, abdomen and pelvis  06/07/2020. FINDINGS: Vertebral body height and alignment are maintained. Intervertebral disc space height is normal. Paraspinous structures demonstrate atherosclerosis. IMPRESSION: Normal appearing lumbar spine. Aortic Atherosclerosis (ICD10-I70.0). Electronically Signed   By: Inge Rise M.D.   On: 06/27/2020 11:01   DG CHEST PORT 1 VIEW  Result Date: 07/08/2020 CLINICAL DATA:  Progressive coughing. EXAM: PORTABLE CHEST 1 VIEW COMPARISON:  07/01/2020 FINDINGS: Normal heart size. Aortic  atherosclerotic calcifications noted. No pleural effusion or edema identified. There is a retrocardiac opacity within the medial left lung base which may represent atelectasis or airspace disease. Right lung appears clear. IMPRESSION: Retrocardiac opacity within the left lung base which may represent atelectasis or airspace disease. Electronically Signed   By: Kerby Moors M.D.   On: 07/08/2020 05:27   DG CHEST PORT 1 VIEW  Result Date: 07/01/2020 CLINICAL DATA:  79 year old male with acute respiratory failure EXAM: PORTABLE CHEST 1 VIEW COMPARISON:  06/30/2020 FINDINGS: Cardiomediastinal silhouette unchanged in size and contour. Similar appearance of architectural distortion of the right greater than left lungs. No pneumothorax. No large pleural effusion. Coarsened interstitial markings persist with no new confluent airspace disease. Interval slight withdrawal of the left IJ central venous catheter with the tip appearing to terminate superior vena cava. IMPRESSION: Similar appearance of the lungs, with chronic lung changes, scarring/atelectasis and no new confluent airspace disease. Interval slight withdrawal of left IJ central venous catheter. Electronically Signed   By: Corrie Mckusick D.O.   On: 07/01/2020 08:54   DG Chest Port 1 View  Result Date: 06/30/2020 CLINICAL DATA:  Central line placement. EXAM: PORTABLE CHEST 1 VIEW COMPARISON:  June 10, 2020. FINDINGS: The heart size and mediastinal contours are  within normal limits. No pneumothorax is noted. Left lung is clear. Mild right basilar subsegmental atelectasis is noted. Interval placement of left internal jugular catheter with distal tip in expected position of right atrium. The visualized skeletal structures are unremarkable. IMPRESSION: Interval placement of left internal jugular catheter with distal tip in expected position of right atrium. Withdrawal by 3-4 cm is recommended. Mild right basilar subsegmental atelectasis is noted. Electronically Signed   By: Marijo Conception M.D.   On: 06/30/2020 16:10   DG Abd 2 Views  Result Date: 07/07/2020 CLINICAL DATA:  Fecal impaction EXAM: ABDOMEN - 2 VIEW COMPARISON:  None FINDINGS: Normal retained stool burden. Specifically no abnormal retained stool burden seen within the rectum. Scattered gas throughout colon. Small bowel gas pattern normal.  Bones demineralized. IMPRESSION: Normal bowel gas pattern. No abnormal retained stool burden. Electronically Signed   By: Lavonia Dana M.D.   On: 07/07/2020 12:51   DG Abd Portable 1V  Result Date: 07/03/2020 CLINICAL DATA:  79 y.o. male with constipation. Medical history significant of CLL, DM, HTN, HLD, combined systolic/diastolic CHF EXAM: PORTABLE ABDOMEN - 1 VIEW COMPARISON:  Abdominal radiograph 07/01/2020 FINDINGS: The far left hemiabdomen is excluded from field of view. The bowel gas pattern is nonobstructive. Moderate stool burden. No supine evidence for free air. No unexpected radiopaque calcification. No acute finding in the visualized skeleton. IMPRESSION: Nonobstructive bowel gas pattern. Moderate stool burden. Electronically Signed   By: Audie Pinto M.D.   On: 07/03/2020 08:34   DG Abd Portable 1V  Result Date: 07/01/2020 CLINICAL DATA:  Fecal impaction. EXAM: PORTABLE ABDOMEN - 1 VIEW COMPARISON:  February 02, 2018. FINDINGS: No abnormal bowel dilatation is noted. Mild amount of stool is noted in the rectum. No radio-opaque calculi or other  significant radiographic abnormality are seen. IMPRESSION: Mild stool burden is noted. No evidence of bowel obstruction or ileus. Electronically Signed   By: Marijo Conception M.D.   On: 07/01/2020 15:34   VAS [place] LOWER EXTREMITY VENOUS (DVT) (ONLY [facility] & [facility] 7a-7p)  Result Date: 06/28/2020  Lower Venous DVTStudy Indications: Edema.  Comparison       Piror studies RT LEV 06-11-2020 and 06-07-2020. Negative for Study:  DVT. Performing Technologist: Darlin Coco  Examination Guidelines: A complete evaluation includes B-mode imaging, spectral Doppler, color Doppler, and power Doppler as needed of all accessible portions of each vessel. Bilateral testing is considered an integral part of a complete examination. Limited examinations for reoccurring indications may be performed as noted. The reflux portion of the exam is performed with the patient in reverse Trendelenburg.  +---------+---------------+---------+-----------+----------+--------------+ RIGHT    CompressibilityPhasicitySpontaneityPropertiesThrombus Aging +---------+---------------+---------+-----------+----------+--------------+ CFV      Full           Yes      Yes                                 +---------+---------------+---------+-----------+----------+--------------+ SFJ      Full                                                        +---------+---------------+---------+-----------+----------+--------------+ FV Prox  Full                                                        +---------+---------------+---------+-----------+----------+--------------+ FV Mid   Full                                                        +---------+---------------+---------+-----------+----------+--------------+ FV DistalFull                                                        +---------+---------------+---------+-----------+----------+--------------+ PFV      Full                                                         +---------+---------------+---------+-----------+----------+--------------+ POP      Full           Yes      Yes                                 +---------+---------------+---------+-----------+----------+--------------+ PTV      Full                                                        +---------+---------------+---------+-----------+----------+--------------+ PERO     Full                                                        +---------+---------------+---------+-----------+----------+--------------+   +----+---------------+---------+-----------+----------+--------------+  LEFTCompressibilityPhasicitySpontaneityPropertiesThrombus Aging +----+---------------+---------+-----------+----------+--------------+ CFV Full           Yes      Yes                                 +----+---------------+---------+-----------+----------+--------------+     Summary: RIGHT: - There is no evidence of deep vein thrombosis in the lower extremity.  - No cystic structure found in the popliteal fossa.  LEFT: - No evidence of common femoral vein obstruction.  *See table(s) above for measurements and observations. Electronically signed by Curt Jews MD on 06/28/2020 at 7:40:49 PM.    Final    VAS [place] UPPER EXTREMITY VENOUS DUPLEX  Result Date: 07/04/2020 UPPER VENOUS STUDY  Indications: Edema Risk Factors: None identified. Comparison Study: No prior studies. Performing Technologist: Oliver Hum RVT  Examination Guidelines: A complete evaluation includes B-mode imaging, spectral Doppler, color Doppler, and power Doppler as needed of all accessible portions of each vessel. Bilateral testing is considered an integral part of a complete examination. Limited examinations for reoccurring indications may be performed as noted.  Right Findings: +----------+------------+---------+-----------+----------+-----------------+ RIGHT     CompressiblePhasicitySpontaneousProperties     Summary       +----------+------------+---------+-----------+----------+-----------------+ IJV           Full       Yes       Yes                                +----------+------------+---------+-----------+----------+-----------------+ Subclavian  Partial      No        No               Age Indeterminate +----------+------------+---------+-----------+----------+-----------------+ Axillary      Full       Yes       Yes                                +----------+------------+---------+-----------+----------+-----------------+ Brachial      Full       Yes       Yes                                +----------+------------+---------+-----------+----------+-----------------+ Radial        Full                                                    +----------+------------+---------+-----------+----------+-----------------+ Ulnar         Full                                                    +----------+------------+---------+-----------+----------+-----------------+ Cephalic      Full                                                    +----------+------------+---------+-----------+----------+-----------------+ Basilic  Full                                                    +----------+------------+---------+-----------+----------+-----------------+  Left Findings: +----------+------------+---------+-----------+----------+-------+ LEFT      CompressiblePhasicitySpontaneousPropertiesSummary +----------+------------+---------+-----------+----------+-------+ Subclavian    Full       Yes       Yes                      +----------+------------+---------+-----------+----------+-------+  Summary:  Right: No evidence of superficial vein thrombosis in the upper extremity. 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  Left: No evidence of thrombosis in the subclavian.  *See table(s) above for measurements and  observations.  Diagnosing physician: Servando Snare MD Electronically signed by Servando Snare MD on 07/04/2020 at 5:29:09 PM.    Final    Flex Sig 07/05/2020 Dr. Tarri Glenn: Findings: Non-bleeding external hhemorrhoids were found on perianal exam. A decubitus ulcer was also identified on perianal exam. A single (solitary) ulcer was found in the rectum. No active bleeding  was present. A large amount of solid stool was found in the rectum consistent with  fecal impaction, precluding any meaningful visualization. I manually  disimpacted the large rectal stool ball. Too much stool remained for any  additional meaningful evaluation of the colonic mucosa. Impression:  - Preparation of the colon was inadequate due to  fecal impaction. Manual disimpaction performed  during the procedure. - Non-bleeding external hemorrhoids found on  perianal exam. - Decubitus ulcer identified on perianal exam. - A single (solitary) ulcer in the rectum  consistent with stercoral ulcer - the source of  recent bleeding. Not actively bleeding. - No specimens collected. Moderate Sedation: Not Applicable - Patient had care per Anesthesia. Recommendation: - Return patient to hospital ward for ongoing care. - Advance diet as tolerated. - Aggressive bowel regimen to prevent recurrence of  fecal impaction including Miralax 17 g BID,  psyllium daily, and drinking at least 64 ounces of  water daily. - Consider Carafate enemas 2 gm in 20 ml water BID  x 6  weeks. - Avoid anticoagulation as able as this ulcer is at  high risk for rebleeding. - I alerted the hospitalist team to the finding of  the decubitus ulcer. I discussed these findings and recommendations with  the patient in the endoscopy recovery area and with  his wife by telephone. Please call the on-call gastroenterologist with any  additional questions or concerns during this  hospitalization.  Subjective: Bleeding improved, got more sleep last night but wants to go home as he'll be more comfortable there. Cough is improved, no dyspnea or chest pain reported.   Discharge Exam: Vitals:   07/10/20 2142 07/11/20 0509  BP: (!) 146/72 (!) 142/82  Pulse: 67 66  Resp: 16 16  Temp: 98.3 F (36.8 C) 98.4 F (36.9 C)  SpO2: 98% 97%   General: Frail but in no distress Cardiovascular: Rate controlled, S1/S2 +, no rubs, no gallops Respiratory: Nonlabored on room air, improved LLL crackles noted. Abdominal: Soft, NT, ND, bowel sounds + Extremities: No edema, no cyanosis  Labs: BNP (last 3 results) No results for input(s): BNP in the last 8760 hours. Basic Metabolic Panel: Recent Labs  Lab 07/05/20 0428 07/05/20 7616 07/06/20 0501 07/07/20 0737 07/08/20 1062 07/10/20 0527 07/11/20 6948  NA 131*   < > 135 135 135 134* 134*  K 3.5   < > 2.9* 3.1* 3.3* 4.2 4.2  CL 95*   < > 95* 95* 96* 96* 98  CO2 28   < > 32 32 32 30 29  GLUCOSE 182*   < > 169* 128* 119* 186* 153*  BUN 12   < > _0 CREATININE 0.46*   < > 0.56* 0.52* 0.45* 0.45* 0.52*  CALCIUM 7.9*   < > 7.9* 7.8* 7.8* 8.0* 8.1*  MG 1.7  --   --  1.7  --  1.8 1.7   < > = values in this interval not displayed.   Liver Function Tests: Recent Labs  Lab  07/08/20 0521  AST 18  ALT 15  ALKPHOS 57  BILITOT 0.9  PROT 4.1*  ALBUMIN 1.9*   No results for input(s): LIPASE, AMYLASE in the last 168 hours. No results for input(s): AMMONIA in the last 168 hours. CBC: Recent Labs  Lab 07/06/20 0501 07/06/20 1605 07/07/20 0429 07/07/20 0429 07/08/20 0521 07/09/20 0939 07/10/20 0527 07/10/20 1745 07/11/20 0446  WBC 2.5*  --  1.9*  --  2.0*  --  1.7*  --  2.0*  NEUTROABS  --   --  1.1*  --  1.3*  --   --   --   --   HGB 8.4*   < > 8.6*   < > 8.7* 8.4* 8.1* 8.3* 9.0*  HCT 25.7*   < > 26.3*   < > 27.3* 26.2* 25.6* 25.7* 29.0*  MCV 92.1  --  92.6  --  93.5  --  95.2  --  94.8  PLT 106*  --  97*  --  96*  --  113*  --  138*   < > = values in this interval not displayed.   Cardiac Enzymes: No results for input(s): CKTOTAL, CKMB, CKMBINDEX, TROPONINI in the last 168 hours. BNP: Invalid input(s): POCBNP CBG: Recent Labs  Lab 07/10/20 0741 07/10/20 1159 07/10/20 1644 07/10/20 2139 07/11/20 0737  GLUCAP 163* 107* 224* 145* 134*   D-Dimer No results for input(s): DDIMER in the last 72 hours. Hgb A1c No results for input(s): HGBA1C in the last 72 hours. Lipid Profile No results for input(s): CHOL, HDL, LDLCALC, TRIG, CHOLHDL, LDLDIRECT in the last 72 hours. Thyroid function studies No results for input(s): TSH, T4TOTAL, T3FREE, THYROIDAB in the last 72 hours.  Invalid input(s): FREET3 Anemia work up No results for input(s): VITAMINB12, FOLATE, FERRITIN, TIBC, IRON, RETICCTPCT in the last 72 hours. Urinalysis    Component Value Date/Time   COLORURINE YELLOW 06/28/2020 0758   APPEARANCEUR CLEAR 06/28/2020 0758   LABSPEC 1.018 06/28/2020 0758   PHURINE 5.0 06/28/2020 0758   GLUCOSEU >=500 (A) 06/28/2020 0758   GLUCOSEU 500 (A) 06/27/2020 1354   HGBUR NEGATIVE 06/28/2020 0758   BILIRUBINUR NEGATIVE 06/28/2020 0758   BILIRUBINUR negative 07/08/2018 1523   KETONESUR 80 (A) 06/28/2020 0758   PROTEINUR NEGATIVE 06/28/2020 0758    UROBILINOGEN 0.2 06/27/2020 1354   NITRITE NEGATIVE 06/28/2020 0758   LEUKOCYTESUR NEGATIVE 06/28/2020 0758    Microbiology Recent Results (from the past 240 hour(s))  Expectorated sputum assessment w rflx to resp cult     Status: None   Collection Time: 07/06/20  1:30 AM   Specimen: Expectorated Sputum  Result Value Ref Range Status   Specimen Description EXPECTORATED SPUTUM  Final   Special Requests NONE  Final   Sputum evaluation   Final    THIS SPECIMEN IS ACCEPTABLE FOR SPUTUM CULTURE Performed at [facility] [address] [place], [place] [address]    Report Status 07/06/2020 FINAL  Final  Culture, respiratory     Status: None   Collection Time: 07/06/20  1:30 AM  Result Value Ref Range Status   Specimen Description   Final    EXPECTORATED SPUTUM Performed at [facility], [place] [address], [place], [place] [address]    Special Requests   Final    NONE Reflexed from [phone] [phone] Performed at [facility], [place] [address] [place], [place] [address]    Gram Stain   Final    RARE WBC PRESENT, PREDOMINANTLY PMN FEW GRAM POSITIVE COCCI RARE GRAM NEGATIVE RODS Performed at [facility], [place] [address] [place],  [address]    Culture MODERATE KLEBSIELLA PNEUMONIAE  Final   Report Status 07/08/2020 FINAL  Final   Organism ID, Bacteria KLEBSIELLA PNEUMONIAE  Final      Susceptibility   Klebsiella pneumoniae - MIC*    AMPICILLIN >=32 RESISTANT Resistant     CEFAZOLIN <=4 SENSITIVE Sensitive     CEFEPIME <=0.12 SENSITIVE Sensitive     CEFTAZIDIME <=1 SENSITIVE Sensitive     CEFTRIAXONE <=0.25 SENSITIVE Sensitive     CIPROFLOXACIN <=0.25 SENSITIVE Sensitive     GENTAMICIN <=1 SENSITIVE Sensitive     IMIPENEM <=0.25 SENSITIVE Sensitive     TRIMETH/SULFA <=20 SENSITIVE Sensitive     AMPICILLIN/SULBACTAM >=32 RESISTANT Resistant     PIP/TAZO <=4 SENSITIVE Sensitive     * MODERATE KLEBSIELLA PNEUMONIAE     Time coordinating discharge: Approximately 40 minutes  Patrecia Pour, MD  [facility] 07/11/2020, 7:46 PM

## 2020-07-12 ENCOUNTER — Telehealth: Payer: Self-pay | Admitting: Endocrinology

## 2020-07-12 DIAGNOSIS — K222 Esophageal obstruction: Secondary | ICD-10-CM | POA: Diagnosis not present

## 2020-07-12 DIAGNOSIS — R627 Adult failure to thrive: Secondary | ICD-10-CM | POA: Diagnosis not present

## 2020-07-12 DIAGNOSIS — E119 Type 2 diabetes mellitus without complications: Secondary | ICD-10-CM | POA: Diagnosis not present

## 2020-07-12 DIAGNOSIS — I110 Hypertensive heart disease with heart failure: Secondary | ICD-10-CM | POA: Diagnosis not present

## 2020-07-12 DIAGNOSIS — C9110 Chronic lymphocytic leukemia of B-cell type not having achieved remission: Secondary | ICD-10-CM | POA: Diagnosis not present

## 2020-07-12 DIAGNOSIS — K5790 Diverticulosis of intestine, part unspecified, without perforation or abscess without bleeding: Secondary | ICD-10-CM | POA: Diagnosis not present

## 2020-07-12 DIAGNOSIS — D630 Anemia in neoplastic disease: Secondary | ICD-10-CM | POA: Diagnosis not present

## 2020-07-12 DIAGNOSIS — I5042 Chronic combined systolic (congestive) and diastolic (congestive) heart failure: Secondary | ICD-10-CM | POA: Diagnosis not present

## 2020-07-12 DIAGNOSIS — E440 Moderate protein-calorie malnutrition: Secondary | ICD-10-CM | POA: Diagnosis not present

## 2020-07-12 DIAGNOSIS — G4730 Sleep apnea, unspecified: Secondary | ICD-10-CM | POA: Diagnosis not present

## 2020-07-12 NOTE — Telephone Encounter (Signed)
Okay to approve request.  Please remind his wife established with a PCP

## 2020-07-12 NOTE — Telephone Encounter (Signed)
Beth RN with [facility] ph# [phone] called re: Requests that a verbal order for Nursing, Social Work, Best boy. Beth requests to be called at the ph# listed above to also go over medications and diagnoses.

## 2020-07-12 NOTE — Telephone Encounter (Signed)
Called Beth,RN back and provided these verbal orders. She verbalized understanding. She was also informed that the pt was supposed to establish with a PCP and Beth stated that she would mention it to the family again, but they are dealing with a lot right now considering his condition.

## 2020-07-13 DIAGNOSIS — E440 Moderate protein-calorie malnutrition: Secondary | ICD-10-CM | POA: Diagnosis not present

## 2020-07-13 DIAGNOSIS — K5790 Diverticulosis of intestine, part unspecified, without perforation or abscess without bleeding: Secondary | ICD-10-CM | POA: Diagnosis not present

## 2020-07-13 DIAGNOSIS — G4730 Sleep apnea, unspecified: Secondary | ICD-10-CM | POA: Diagnosis not present

## 2020-07-13 DIAGNOSIS — C9110 Chronic lymphocytic leukemia of B-cell type not having achieved remission: Secondary | ICD-10-CM | POA: Diagnosis not present

## 2020-07-13 DIAGNOSIS — I5042 Chronic combined systolic (congestive) and diastolic (congestive) heart failure: Secondary | ICD-10-CM | POA: Diagnosis not present

## 2020-07-13 DIAGNOSIS — I110 Hypertensive heart disease with heart failure: Secondary | ICD-10-CM | POA: Diagnosis not present

## 2020-07-13 DIAGNOSIS — D630 Anemia in neoplastic disease: Secondary | ICD-10-CM | POA: Diagnosis not present

## 2020-07-13 DIAGNOSIS — K222 Esophageal obstruction: Secondary | ICD-10-CM | POA: Diagnosis not present

## 2020-07-13 DIAGNOSIS — E119 Type 2 diabetes mellitus without complications: Secondary | ICD-10-CM | POA: Diagnosis not present

## 2020-07-13 DIAGNOSIS — R627 Adult failure to thrive: Secondary | ICD-10-CM | POA: Diagnosis not present

## 2020-07-14 ENCOUNTER — Other Ambulatory Visit: Payer: Self-pay | Admitting: Endocrinology

## 2020-07-15 ENCOUNTER — Other Ambulatory Visit: Payer: Self-pay | Admitting: Physician Assistant

## 2020-07-15 DIAGNOSIS — I5042 Chronic combined systolic (congestive) and diastolic (congestive) heart failure: Secondary | ICD-10-CM | POA: Diagnosis not present

## 2020-07-15 DIAGNOSIS — K222 Esophageal obstruction: Secondary | ICD-10-CM | POA: Diagnosis not present

## 2020-07-15 DIAGNOSIS — C9110 Chronic lymphocytic leukemia of B-cell type not having achieved remission: Secondary | ICD-10-CM | POA: Diagnosis not present

## 2020-07-15 DIAGNOSIS — I110 Hypertensive heart disease with heart failure: Secondary | ICD-10-CM | POA: Diagnosis not present

## 2020-07-15 DIAGNOSIS — E119 Type 2 diabetes mellitus without complications: Secondary | ICD-10-CM | POA: Diagnosis not present

## 2020-07-15 DIAGNOSIS — K5790 Diverticulosis of intestine, part unspecified, without perforation or abscess without bleeding: Secondary | ICD-10-CM | POA: Diagnosis not present

## 2020-07-15 DIAGNOSIS — R634 Abnormal weight loss: Secondary | ICD-10-CM

## 2020-07-15 DIAGNOSIS — D630 Anemia in neoplastic disease: Secondary | ICD-10-CM | POA: Diagnosis not present

## 2020-07-15 DIAGNOSIS — E440 Moderate protein-calorie malnutrition: Secondary | ICD-10-CM | POA: Diagnosis not present

## 2020-07-15 DIAGNOSIS — R627 Adult failure to thrive: Secondary | ICD-10-CM | POA: Diagnosis not present

## 2020-07-15 DIAGNOSIS — G4730 Sleep apnea, unspecified: Secondary | ICD-10-CM | POA: Diagnosis not present

## 2020-07-15 MED FILL — DRONABINOL 2.5 MG CAPSULE: 2.5 | 15 days supply | Qty: 30 | Fill #0

## 2020-07-16 ENCOUNTER — Other Ambulatory Visit: Payer: Self-pay | Admitting: Endocrinology

## 2020-07-17 DIAGNOSIS — D630 Anemia in neoplastic disease: Secondary | ICD-10-CM | POA: Diagnosis not present

## 2020-07-17 DIAGNOSIS — E440 Moderate protein-calorie malnutrition: Secondary | ICD-10-CM | POA: Diagnosis not present

## 2020-07-17 DIAGNOSIS — C9110 Chronic lymphocytic leukemia of B-cell type not having achieved remission: Secondary | ICD-10-CM | POA: Diagnosis not present

## 2020-07-17 DIAGNOSIS — R627 Adult failure to thrive: Secondary | ICD-10-CM | POA: Diagnosis not present

## 2020-07-17 DIAGNOSIS — E119 Type 2 diabetes mellitus without complications: Secondary | ICD-10-CM | POA: Diagnosis not present

## 2020-07-17 DIAGNOSIS — K222 Esophageal obstruction: Secondary | ICD-10-CM | POA: Diagnosis not present

## 2020-07-17 DIAGNOSIS — K5790 Diverticulosis of intestine, part unspecified, without perforation or abscess without bleeding: Secondary | ICD-10-CM | POA: Diagnosis not present

## 2020-07-17 DIAGNOSIS — I110 Hypertensive heart disease with heart failure: Secondary | ICD-10-CM | POA: Diagnosis not present

## 2020-07-17 DIAGNOSIS — G4730 Sleep apnea, unspecified: Secondary | ICD-10-CM | POA: Diagnosis not present

## 2020-07-17 DIAGNOSIS — I5042 Chronic combined systolic (congestive) and diastolic (congestive) heart failure: Secondary | ICD-10-CM | POA: Diagnosis not present

## 2020-07-18 DIAGNOSIS — K5790 Diverticulosis of intestine, part unspecified, without perforation or abscess without bleeding: Secondary | ICD-10-CM | POA: Diagnosis not present

## 2020-07-18 DIAGNOSIS — I5042 Chronic combined systolic (congestive) and diastolic (congestive) heart failure: Secondary | ICD-10-CM | POA: Diagnosis not present

## 2020-07-18 DIAGNOSIS — R627 Adult failure to thrive: Secondary | ICD-10-CM | POA: Diagnosis not present

## 2020-07-18 DIAGNOSIS — D630 Anemia in neoplastic disease: Secondary | ICD-10-CM | POA: Diagnosis not present

## 2020-07-18 DIAGNOSIS — K222 Esophageal obstruction: Secondary | ICD-10-CM | POA: Diagnosis not present

## 2020-07-18 DIAGNOSIS — E440 Moderate protein-calorie malnutrition: Secondary | ICD-10-CM | POA: Diagnosis not present

## 2020-07-18 DIAGNOSIS — C9110 Chronic lymphocytic leukemia of B-cell type not having achieved remission: Secondary | ICD-10-CM | POA: Diagnosis not present

## 2020-07-18 DIAGNOSIS — I110 Hypertensive heart disease with heart failure: Secondary | ICD-10-CM | POA: Diagnosis not present

## 2020-07-18 DIAGNOSIS — G4730 Sleep apnea, unspecified: Secondary | ICD-10-CM | POA: Diagnosis not present

## 2020-07-18 DIAGNOSIS — E119 Type 2 diabetes mellitus without complications: Secondary | ICD-10-CM | POA: Diagnosis not present

## 2020-07-19 ENCOUNTER — Emergency Department (HOSPITAL_COMMUNITY): Payer: Medicare HMO

## 2020-07-19 ENCOUNTER — Encounter (HOSPITAL_COMMUNITY): Payer: Self-pay | Admitting: Student

## 2020-07-19 ENCOUNTER — Other Ambulatory Visit: Payer: Self-pay

## 2020-07-19 ENCOUNTER — Inpatient Hospital Stay (HOSPITAL_COMMUNITY)
Admission: EM | Admit: 2020-07-19 | Discharge: 2020-07-21 | DRG: 640 | Disposition: A | Discharge status: Home Health | Payer: Medicare HMO | Attending: Internal Medicine | Admitting: Internal Medicine

## 2020-07-19 DIAGNOSIS — Z8249 Family history of ischemic heart disease and other diseases of the circulatory system: Secondary | ICD-10-CM

## 2020-07-19 DIAGNOSIS — R5381 Other malaise: Secondary | ICD-10-CM | POA: Diagnosis not present

## 2020-07-19 DIAGNOSIS — Z66 Do not resuscitate: Secondary | ICD-10-CM

## 2020-07-19 DIAGNOSIS — E119 Type 2 diabetes mellitus without complications: Secondary | ICD-10-CM | POA: Diagnosis not present

## 2020-07-19 DIAGNOSIS — E11649 Type 2 diabetes mellitus with hypoglycemia without coma: Secondary | ICD-10-CM | POA: Diagnosis not present

## 2020-07-19 DIAGNOSIS — K5790 Diverticulosis of intestine, part unspecified, without perforation or abscess without bleeding: Secondary | ICD-10-CM | POA: Diagnosis present

## 2020-07-19 DIAGNOSIS — Z681 Body mass index (BMI) 19 or less, adult: Secondary | ICD-10-CM

## 2020-07-19 DIAGNOSIS — E785 Hyperlipidemia, unspecified: Secondary | ICD-10-CM | POA: Diagnosis present

## 2020-07-19 DIAGNOSIS — K5641 Fecal impaction: Secondary | ICD-10-CM | POA: Diagnosis present

## 2020-07-19 DIAGNOSIS — D62 Acute posthemorrhagic anemia: Secondary | ICD-10-CM | POA: Diagnosis present

## 2020-07-19 DIAGNOSIS — I480 Paroxysmal atrial fibrillation: Secondary | ICD-10-CM

## 2020-07-19 DIAGNOSIS — K921 Melena: Secondary | ICD-10-CM | POA: Diagnosis present

## 2020-07-19 DIAGNOSIS — Y95 Nosocomial condition: Secondary | ICD-10-CM | POA: Diagnosis not present

## 2020-07-19 DIAGNOSIS — D61818 Other pancytopenia: Secondary | ICD-10-CM | POA: Diagnosis present

## 2020-07-19 DIAGNOSIS — E1110 Type 2 diabetes mellitus with ketoacidosis without coma: Secondary | ICD-10-CM | POA: Diagnosis not present

## 2020-07-19 DIAGNOSIS — I5043 Acute on chronic combined systolic (congestive) and diastolic (congestive) heart failure: Secondary | ICD-10-CM | POA: Diagnosis not present

## 2020-07-19 DIAGNOSIS — Z794 Long term (current) use of insulin: Secondary | ICD-10-CM | POA: Diagnosis not present

## 2020-07-19 DIAGNOSIS — Z515 Encounter for palliative care: Secondary | ICD-10-CM

## 2020-07-19 DIAGNOSIS — Z7189 Other specified counseling: Secondary | ICD-10-CM

## 2020-07-19 DIAGNOSIS — K219 Gastro-esophageal reflux disease without esophagitis: Secondary | ICD-10-CM | POA: Diagnosis present

## 2020-07-19 DIAGNOSIS — R627 Adult failure to thrive: Secondary | ICD-10-CM | POA: Diagnosis not present

## 2020-07-19 DIAGNOSIS — Z79899 Other long term (current) drug therapy: Secondary | ICD-10-CM

## 2020-07-19 DIAGNOSIS — I4891 Unspecified atrial fibrillation: Secondary | ICD-10-CM | POA: Diagnosis present

## 2020-07-19 DIAGNOSIS — J159 Unspecified bacterial pneumonia: Secondary | ICD-10-CM

## 2020-07-19 DIAGNOSIS — R05 Cough: Secondary | ICD-10-CM | POA: Diagnosis not present

## 2020-07-19 DIAGNOSIS — Z20822 Contact with and (suspected) exposure to covid-19: Secondary | ICD-10-CM | POA: Diagnosis not present

## 2020-07-19 DIAGNOSIS — C9110 Chronic lymphocytic leukemia of B-cell type not having achieved remission: Secondary | ICD-10-CM | POA: Diagnosis not present

## 2020-07-19 DIAGNOSIS — M7989 Other specified soft tissue disorders: Secondary | ICD-10-CM | POA: Diagnosis present

## 2020-07-19 DIAGNOSIS — I313 Pericardial effusion (noninflammatory): Secondary | ICD-10-CM | POA: Diagnosis not present

## 2020-07-19 DIAGNOSIS — K633 Ulcer of intestine: Secondary | ICD-10-CM | POA: Diagnosis present

## 2020-07-19 DIAGNOSIS — K259 Gastric ulcer, unspecified as acute or chronic, without hemorrhage or perforation: Secondary | ICD-10-CM | POA: Diagnosis not present

## 2020-07-19 DIAGNOSIS — F419 Anxiety disorder, unspecified: Secondary | ICD-10-CM | POA: Diagnosis present

## 2020-07-19 DIAGNOSIS — E538 Deficiency of other specified B group vitamins: Secondary | ICD-10-CM | POA: Diagnosis not present

## 2020-07-19 DIAGNOSIS — R58 Hemorrhage, not elsewhere classified: Secondary | ICD-10-CM | POA: Diagnosis present

## 2020-07-19 DIAGNOSIS — R0600 Dyspnea, unspecified: Secondary | ICD-10-CM | POA: Diagnosis not present

## 2020-07-19 DIAGNOSIS — I5041 Acute combined systolic (congestive) and diastolic (congestive) heart failure: Secondary | ICD-10-CM | POA: Diagnosis not present

## 2020-07-19 DIAGNOSIS — R64 Cachexia: Secondary | ICD-10-CM | POA: Diagnosis present

## 2020-07-19 DIAGNOSIS — I700 Atherosclerosis of aorta: Secondary | ICD-10-CM | POA: Diagnosis not present

## 2020-07-19 DIAGNOSIS — Z888 Allergy status to other drugs, medicaments and biological substances status: Secondary | ICD-10-CM

## 2020-07-19 DIAGNOSIS — Z0389 Encounter for observation for other suspected diseases and conditions ruled out: Secondary | ICD-10-CM | POA: Diagnosis not present

## 2020-07-19 DIAGNOSIS — E876 Hypokalemia: Secondary | ICD-10-CM

## 2020-07-19 DIAGNOSIS — M109 Gout, unspecified: Secondary | ICD-10-CM | POA: Diagnosis present

## 2020-07-19 DIAGNOSIS — R5383 Other fatigue: Secondary | ICD-10-CM | POA: Diagnosis not present

## 2020-07-19 DIAGNOSIS — E0810 Diabetes mellitus due to underlying condition with ketoacidosis without coma: Secondary | ICD-10-CM

## 2020-07-19 DIAGNOSIS — K644 Residual hemorrhoidal skin tags: Secondary | ICD-10-CM | POA: Diagnosis present

## 2020-07-19 DIAGNOSIS — I10 Essential (primary) hypertension: Secondary | ICD-10-CM | POA: Diagnosis not present

## 2020-07-19 DIAGNOSIS — M5441 Lumbago with sciatica, right side: Secondary | ICD-10-CM

## 2020-07-19 DIAGNOSIS — I110 Hypertensive heart disease with heart failure: Secondary | ICD-10-CM | POA: Diagnosis present

## 2020-07-19 DIAGNOSIS — E43 Unspecified severe protein-calorie malnutrition: Secondary | ICD-10-CM | POA: Diagnosis present

## 2020-07-19 DIAGNOSIS — K648 Other hemorrhoids: Secondary | ICD-10-CM | POA: Diagnosis present

## 2020-07-19 DIAGNOSIS — Z87891 Personal history of nicotine dependence: Secondary | ICD-10-CM

## 2020-07-19 DIAGNOSIS — K222 Esophageal obstruction: Secondary | ICD-10-CM | POA: Diagnosis present

## 2020-07-19 DIAGNOSIS — R069 Unspecified abnormalities of breathing: Secondary | ICD-10-CM | POA: Diagnosis not present

## 2020-07-19 DIAGNOSIS — R059 Cough, unspecified: Secondary | ICD-10-CM

## 2020-07-19 DIAGNOSIS — Z841 Family history of disorders of kidney and ureter: Secondary | ICD-10-CM

## 2020-07-19 DIAGNOSIS — J189 Pneumonia, unspecified organism: Secondary | ICD-10-CM | POA: Diagnosis not present

## 2020-07-19 DIAGNOSIS — I82621 Acute embolism and thrombosis of deep veins of right upper extremity: Secondary | ICD-10-CM | POA: Diagnosis present

## 2020-07-19 DIAGNOSIS — I2510 Atherosclerotic heart disease of native coronary artery without angina pectoris: Secondary | ICD-10-CM | POA: Diagnosis not present

## 2020-07-19 DIAGNOSIS — R0989 Other specified symptoms and signs involving the circulatory and respiratory systems: Secondary | ICD-10-CM | POA: Diagnosis not present

## 2020-07-19 DIAGNOSIS — G4730 Sleep apnea, unspecified: Secondary | ICD-10-CM | POA: Diagnosis present

## 2020-07-19 DIAGNOSIS — J150 Pneumonia due to Klebsiella pneumoniae: Secondary | ICD-10-CM | POA: Diagnosis present

## 2020-07-19 DIAGNOSIS — Z8719 Personal history of other diseases of the digestive system: Secondary | ICD-10-CM

## 2020-07-19 DIAGNOSIS — R0602 Shortness of breath: Secondary | ICD-10-CM | POA: Diagnosis not present

## 2020-07-19 DIAGNOSIS — M549 Dorsalgia, unspecified: Secondary | ICD-10-CM | POA: Diagnosis not present

## 2020-07-19 LAB — COMPREHENSIVE METABOLIC PANEL
ALT: 8 U/L (ref 0–44)
AST: 26 U/L (ref 15–41)
Albumin: 2.3 g/dL — ABNORMAL LOW (ref 3.5–5.0)
Alkaline Phosphatase: 69 U/L (ref 38–126)
Anion gap: 7 (ref 5–15)
BUN: 13 mg/dL (ref 8–23)
CO2: 27 mmol/L (ref 22–32)
Calcium: 8 mg/dL — ABNORMAL LOW (ref 8.9–10.3)
Chloride: 99 mmol/L (ref 98–111)
Creatinine, Ser: 0.67 mg/dL (ref 0.61–1.24)
GFR calc Af Amer: >60 mL/min (ref >60)
GFR calc non Af Amer: >60 mL/min (ref >60)
Glucose, Bld: 134 mg/dL — ABNORMAL HIGH (ref 70–99)
Potassium: 5.1 mmol/L (ref 3.5–5.1)
Sodium: 133 mmol/L — ABNORMAL LOW (ref 135–145)
Total Bilirubin: 1.3 mg/dL — ABNORMAL HIGH (ref 0.3–1.2)
Total Protein: 5 g/dL — ABNORMAL LOW (ref 6.5–8.1)

## 2020-07-19 LAB — CBC WITH DIFFERENTIAL/PLATELET
Abs Immature Granulocytes: 0.03 10*3/uL (ref 0.00–0.07)
Basophils Absolute: 0 10*3/uL (ref 0.0–0.1)
Basophils Relative: 0 %
Eosinophils Absolute: 0 10*3/uL (ref 0.0–0.5)
Eosinophils Relative: 0 %
HCT: 26.8 % — ABNORMAL LOW (ref 39.0–52.0)
Hemoglobin: 8.7 g/dL — ABNORMAL LOW (ref 13.0–17.0)
Immature Granulocytes: 1 %
Lymphocytes Relative: 32 %
Lymphs Abs: 0.9 10*3/uL (ref 0.7–4.0)
MCH: 30.1 pg (ref 26.0–34.0)
MCHC: 32.5 g/dL (ref 30.0–36.0)
MCV: 92.7 fL (ref 80.0–100.0)
Monocytes Absolute: 0.2 10*3/uL (ref 0.1–1.0)
Monocytes Relative: 7 %
Neutro Abs: 1.7 10*3/uL (ref 1.7–7.7)
Neutrophils Relative %: 60 %
Platelets: 157 10*3/uL (ref 150–400)
RBC: 2.89 MIL/uL — ABNORMAL LOW (ref 4.22–5.81)
RDW: 18.6 % — ABNORMAL HIGH (ref 11.5–15.5)
WBC: 2.8 10*3/uL — ABNORMAL LOW (ref 4.0–10.5)
nRBC: 0 % (ref 0.0–0.2)

## 2020-07-19 LAB — URINALYSIS, ROUTINE W REFLEX MICROSCOPIC
Bilirubin Urine: NEGATIVE
Glucose, UA: 150 mg/dL — AB
Hgb urine dipstick: NEGATIVE
Ketones, ur: 20 mg/dL — AB
Leukocytes,Ua: NEGATIVE
Nitrite: NEGATIVE
Protein, ur: NEGATIVE mg/dL
Specific Gravity, Urine: 1.031 — ABNORMAL HIGH (ref 1.005–1.030)
pH: 7 (ref 5.0–8.0)

## 2020-07-19 LAB — GLUCOSE, CAPILLARY: Glucose-Capillary: 97 mg/dL (ref 70–99)

## 2020-07-19 LAB — CBG MONITORING, ED: Glucose-Capillary: 73 mg/dL (ref 70–99)

## 2020-07-19 LAB — SARS CORONAVIRUS 2 BY RT PCR (HOSPITAL ORDER, PERFORMED IN ~~LOC~~ HOSPITAL LAB): SARS Coronavirus 2: NEGATIVE

## 2020-07-19 LAB — BRAIN NATRIURETIC PEPTIDE: B Natriuretic Peptide: 250.2 pg/mL — ABNORMAL HIGH (ref 0.0–100.0)

## 2020-07-19 LAB — TROPONIN I (HIGH SENSITIVITY)
Troponin I (High Sensitivity): 4 ng/L (ref <18)
Troponin I (High Sensitivity): 4 ng/L (ref <18)

## 2020-07-19 MED ORDER — PIPERACILLIN-TAZOBACTAM 3.375 G IVPB 30 MIN
3.3750 g | Freq: Once | INTRAVENOUS | Status: AC
  Administered 2020-07-19: 3.375 g via INTRAVENOUS
  Filled 2020-07-19: qty 50

## 2020-07-19 MED ORDER — ALPRAZOLAM 0.25 MG PO TABS
0.2500 mg | ORAL_TABLET | Freq: Every evening | ORAL | Status: DC | PRN
  Administered 2020-07-19: 0.25 mg via ORAL
  Filled 2020-07-19: qty 1

## 2020-07-19 MED ORDER — SACUBITRIL-VALSARTAN 24-26 MG PO TABS
2.0000 | ORAL_TABLET | Freq: Two times a day (BID) | ORAL | Status: DC
  Administered 2020-07-19 – 2020-07-21 (×4): 2 via ORAL
  Filled 2020-07-19 (×4): qty 2

## 2020-07-19 MED ORDER — POLYETHYLENE GLYCOL 3350 17 G PO PACK
17.0000 g | PACK | Freq: Every day | ORAL | Status: DC
  Filled 2020-07-19: qty 1

## 2020-07-19 MED ORDER — CARVEDILOL 12.5 MG PO TABS
12.5000 mg | ORAL_TABLET | Freq: Two times a day (BID) | ORAL | Status: DC
  Administered 2020-07-20: 12.5 mg via ORAL
  Filled 2020-07-19: qty 1

## 2020-07-19 MED ORDER — DRONABINOL 2.5 MG PO CAPS
2.5000 mg | ORAL_CAPSULE | Freq: Two times a day (BID) | ORAL | Status: DC
  Administered 2020-07-20 – 2020-07-21 (×3): 2.5 mg via ORAL
  Filled 2020-07-19 (×3): qty 1

## 2020-07-19 MED ORDER — ENSURE ENLIVE PO LIQD
237.0000 mL | Freq: Three times a day (TID) | ORAL | Status: DC
  Administered 2020-07-19 – 2020-07-21 (×5): 237 mL via ORAL
  Filled 2020-07-19: qty 237

## 2020-07-19 MED ORDER — INSULIN ASPART 100 UNIT/ML ~~LOC~~ SOLN
0.0000 [IU] | Freq: Three times a day (TID) | SUBCUTANEOUS | Status: DC
  Administered 2020-07-20: 1 [IU] via SUBCUTANEOUS
  Administered 2020-07-20 – 2020-07-21 (×2): 5 [IU] via SUBCUTANEOUS
  Administered 2020-07-21: 3 [IU] via SUBCUTANEOUS
  Filled 2020-07-19: qty 0.09

## 2020-07-19 MED ORDER — IPRATROPIUM BROMIDE 0.02 % IN SOLN
0.5000 mg | Freq: Four times a day (QID) | RESPIRATORY_TRACT | Status: DC | PRN
  Administered 2020-07-19: 0.5 mg via RESPIRATORY_TRACT
  Filled 2020-07-19: qty 2.5

## 2020-07-19 MED ORDER — ALLOPURINOL 100 MG PO TABS
100.0000 mg | ORAL_TABLET | Freq: Two times a day (BID) | ORAL | Status: DC
  Administered 2020-07-20 – 2020-07-21 (×3): 100 mg via ORAL
  Filled 2020-07-19 (×3): qty 1

## 2020-07-19 MED ORDER — POLYETHYLENE GLYCOL 3350 17 G PO PACK
17.0000 g | PACK | Freq: Every day | ORAL | Status: DC
  Administered 2020-07-20: 17 g via ORAL
  Filled 2020-07-19: qty 1

## 2020-07-19 MED ORDER — POTASSIUM CHLORIDE CRYS ER 20 MEQ PO TBCR
20.0000 meq | EXTENDED_RELEASE_TABLET | Freq: Every day | ORAL | Status: DC
  Administered 2020-07-19 – 2020-07-21 (×3): 20 meq via ORAL
  Filled 2020-07-19 (×3): qty 1

## 2020-07-19 MED ORDER — POLYETHYLENE GLYCOL 3350 17 G PO PACK: 17.0000 g | PACK | Freq: Every day | ORAL | Status: DC | PRN

## 2020-07-19 MED ORDER — FOLIC ACID 1 MG PO TABS
1.0000 mg | ORAL_TABLET | Freq: Every day | ORAL | Status: DC
  Administered 2020-07-19 – 2020-07-21 (×3): 1 mg via ORAL
  Filled 2020-07-19 (×4): qty 1

## 2020-07-19 MED ORDER — ONDANSETRON HCL 4 MG/2ML IJ SOLN: 4.0000 mg | Freq: Four times a day (QID) | INTRAMUSCULAR | Status: DC | PRN

## 2020-07-19 MED ORDER — THIAMINE HCL 100 MG PO TABS
100.0000 mg | ORAL_TABLET | Freq: Every day | ORAL | Status: DC
  Administered 2020-07-19 – 2020-07-21 (×3): 100 mg via ORAL
  Filled 2020-07-19 (×3): qty 1

## 2020-07-19 MED ORDER — ACETAMINOPHEN 650 MG RE SUPP: 650.0000 mg | Freq: Four times a day (QID) | RECTAL | Status: DC | PRN

## 2020-07-19 MED ORDER — PANTOPRAZOLE SODIUM 40 MG PO TBEC
80.0000 mg | DELAYED_RELEASE_TABLET | Freq: Every day | ORAL | Status: DC
  Administered 2020-07-20 – 2020-07-21 (×2): 80 mg via ORAL
  Filled 2020-07-19 (×2): qty 2

## 2020-07-19 MED ORDER — ACETAMINOPHEN 325 MG PO TABS: 650.0000 mg | ORAL_TABLET | Freq: Four times a day (QID) | ORAL | Status: DC | PRN

## 2020-07-19 MED ORDER — ROSUVASTATIN CALCIUM 5 MG PO TABS
5.0000 mg | ORAL_TABLET | Freq: Every day | ORAL | Status: DC
  Administered 2020-07-20 – 2020-07-21 (×2): 5 mg via ORAL
  Filled 2020-07-19 (×3): qty 1

## 2020-07-19 MED ORDER — GUAIFENESIN 100 MG/5ML PO SOLN
5.0000 mL | ORAL | Status: DC | PRN
  Administered 2020-07-20: 100 mg via ORAL
  Filled 2020-07-19: qty 10

## 2020-07-19 MED ORDER — INSULIN ASPART 100 UNIT/ML ~~LOC~~ SOLN
0.0000 [IU] | Freq: Every day | SUBCUTANEOUS | Status: DC
  Filled 2020-07-19: qty 0.05

## 2020-07-19 MED ORDER — IOHEXOL 350 MG/ML SOLN
100.0000 mL | Freq: Once | INTRAVENOUS | Status: AC | PRN
  Administered 2020-07-19: 100 mL via INTRAVENOUS

## 2020-07-19 MED ORDER — AMIODARONE HCL 200 MG PO TABS
200.0000 mg | ORAL_TABLET | Freq: Every day | ORAL | Status: DC
  Administered 2020-07-20 – 2020-07-21 (×2): 200 mg via ORAL
  Filled 2020-07-19 (×2): qty 1

## 2020-07-19 MED ORDER — INSULIN GLARGINE 100 UNIT/ML ~~LOC~~ SOLN
6.0000 [IU] | Freq: Every day | SUBCUTANEOUS | Status: DC
  Administered 2020-07-19: 6 [IU] via SUBCUTANEOUS
  Filled 2020-07-19: qty 0.06

## 2020-07-19 MED ORDER — ACETAMINOPHEN-CODEINE #3 300-30 MG PO TABS
1.0000 | ORAL_TABLET | ORAL | Status: DC | PRN
  Administered 2020-07-19 – 2020-07-21 (×4): 1 via ORAL
  Filled 2020-07-19 (×4): qty 1

## 2020-07-19 MED ORDER — ONDANSETRON HCL 4 MG PO TABS: 4.0000 mg | ORAL_TABLET | Freq: Four times a day (QID) | ORAL | Status: DC | PRN

## 2020-07-19 MED ORDER — SODIUM CHLORIDE 0.9 % IN NEBU
3.0000 mL | INHALATION_SOLUTION | Freq: Three times a day (TID) | RESPIRATORY_TRACT | Status: DC | PRN
  Filled 2020-07-19: qty 3

## 2020-07-19 NOTE — ED Triage Notes (Signed)
Patient arrived via [facility] from home.   Pt recently discharge for pneumonia July 27th.    C/O productive coughing, weakness, and no appetite.  C/O edema to feet  Pt reports he finished all his antibiotics.    A/Ox4 Pt is non ambulatory    500ns given by ems 22g in right hand.   BP-116/67 P-67 95-98% RA RR-23 CBG-147

## 2020-07-19 NOTE — ED Provider Notes (Signed)
[facility] DEPT Provider Note   CSN: [id number] Arrival date & time: 07/19/20  1418     History Chief Complaint  Patient presents with  . Cough  . Shortness of Breath    Tony Marshall is a 79 y.o. male with PMH significant for CLL with recent pancytopenia, CHF with 25 to 30% LVEF, HTN, HLD, type II DM, diverticulosis who was recently discharged from the hospital on 07/11/2020 after being admitted for DKA who now presents to the ED with complaints of weakness, lack of appetite, and productive cough.    Patient was also found to have anemia secondary to acute GI bleed and required 2 units PRBC.  Patient found to have stercoral ulcer due to fecal impaction.  During his hospital admission, he developed atrial fibrillation with RVR, but anticoagulation was held given GI bleed.  Patient was also found to have an age-indeterminate right subclavian DVT that was likely related to PICC placement, however similar to the A-fib with RVR, anticoagulation was held.  Patient received his COVID-19 vaccinations earlier this year.  Plain films obtained 07/09/2020 demonstrated small to moderate bilateral layering pleural effusions and recommendation of follow-up plain films and CT chest if his symptoms persist.  His sputum grew Klebsiella and he was started on antibiotics, with improvement.  SNF was recommended at time of discharge.    On my examination, patient tells me that he was at home with his wife.  He has a nurse that comes to the house during the day.  They collectively decided to call 911 because patient has not been eating or drinking well and he has had this unrelenting cough.  Patient endorses pleuritic chest pain and for the cough.  No exertional chest pain symptoms.  He denies any fevers or chills.  He states that his cough has been productive of yellowish-orange mucus.  He states that he developed this collection of symptoms shortly after discharge from the hospital.   He does endorse some right lower leg swelling and skin changes, but states that this has been evaluated previously and negative for DVT.  He states that it was diagnosed as phlebitis.  Patient denies any melena or urinary symptoms.  No abdominal pain, nausea, or vomiting.   HPI     Past Medical History:  Diagnosis Date  . A-fib ([place])   . CLL (chronic lymphocytic leukemia) ([place]) dx'd 2014   progression 12/2019  . Diabetes mellitus without complication ([place])   . Diverticulosis   . External hemorrhoids   . GERD (gastroesophageal reflux disease)   . Hiatal hernia   . History of colon polyps   . Hyperlipidemia   . Hypertension   . Internal hemorrhoids   . Lymphocytosis   . Schatzki's ring   . Sleep apnea     Patient Active Problem List   Diagnosis Date Noted  . Physical deconditioning 07/19/2020  . Stercoral ulcer of rectum   . Prolonged QT interval 07/03/2020  . Atrial fibrillation ([place])   . Shock circulatory ([place]) 07/01/2020  . Lower GI bleed   . Central venous catheter in place   . Fecal impaction ([place])   . Overflow diarrhea   . Rectal bleeding   . DKA (diabetic ketoacidoses) ([place]) 06/28/2020  . Systolic and diastolic CHF, chronic ([place]) 06/28/2020  . Anemia associated with acute blood loss   . Dehydration 06/27/2020  . Protein-calorie malnutrition, severe ([place]) 06/09/2020  . Hyperglycemia due to diabetes mellitus ([place])   . Leukopenia   .  Acute combined systolic and diastolic heart failure ([place])   . Cardiogenic shock ([place])   . Altered mental status   . Failure to thrive in adult   . Symptomatic anemia   . Acute respiratory failure ([place])   . Increased anion gap metabolic acidosis 85/63/1497  . Goals of care, counseling/discussion 03/28/2020  . Encounter for antineoplastic chemotherapy 03/28/2020  . GERD (gastroesophageal reflux disease) 05/13/2014  . History of colonic polyps 05/13/2014  . Hiatal hernia 05/13/2014  . CLL (chronic lymphocytic leukemia) ([employer])  10/25/2013  . ED (erectile dysfunction) 10/20/2013  . Lymphocytosis 09/23/2013  . Essential hypertension 07/21/2013  . Pure hypercholesterolemia 07/21/2013  . Diabetes mellitus ([place]) 07/20/2013    Past Surgical History:  Procedure Laterality Date  . BLEPHAROPLASTY    . COLONOSCOPY  2012  . FLEXIBLE SIGMOIDOSCOPY N/A 07/05/2020   Procedure: FLEXIBLE SIGMOIDOSCOPY;  Surgeon: Thornton Park, MD;  Location: [facility];  Service: Gastroenterology;  Laterality: N/A;  . HEMORRHOID SURGERY     Sclerotherapy  . KNEE SURGERY Right 2002  . SHOULDER SURGERY Right 2007       Family History  Problem Relation Age of Onset  . Kidney disease Mother   . CAD Maternal Aunt   . Colon cancer Neg Hx   . Esophageal cancer Neg Hx   . Rectal cancer Neg Hx   . Stomach cancer Neg Hx     Social History   Tobacco Use  . Smoking status: Never Smoker  . Smokeless tobacco: Former Systems developer    Types: Secondary school teacher  . Vaping Use: Never used  Substance Use Topics  . Alcohol use: Yes    Alcohol/week: 6.0 standard drinks    Types: 6 Cans of beer per week  . Drug use: No    Home Medications Prior to Admission medications   Medication Sig Start Date End Date Taking? Authorizing Provider  acetaminophen (TYLENOL) 500 MG tablet Take 500 mg by mouth every 6 (six) hours as needed for moderate pain.    [provider]  allopurinol (ZYLOPRIM) 100 MG tablet Take 1 tablet (100 mg total) by mouth 2 (two) times daily. 03/28/20   Curt Bears, MD  ALPRAZolam Duanne Moron) 0.25 MG tablet Take 1 tablet (0.25 mg total) by mouth at bedtime as needed for anxiety. 06/21/20   Curt Bears, MD  amiodarone (PACERONE) 200 MG tablet Take 1 tablet (200 mg total) by mouth daily. 07/09/20   Patrecia Pour, MD  carvedilol (COREG) 12.5 MG tablet Take 1 tablet (12.5 mg total) by mouth 2 (two) times daily with a meal. 06/20/20 07/20/20  Antonieta Pert, MD  cephALEXin (KEFLEX) 500 MG capsule Take 1 capsule (500 mg total) by mouth 4  (four) times daily. 07/11/20   Patrecia Pour, MD  dronabinol (MARINOL) 2.5 MG capsule TAKE 1 CAPSULE BY MOUTH TWICE DAILY BEFORE A MEAL 07/15/20   Heilingoetter, Cassandra L, PA-C  esomeprazole (NEXIUM) 40 MG capsule TAKE 1 CAPSULE BY MOUTH EVERY MORNING 30 MINUTES BEFORE BREAKFAST Patient taking differently: Take 40 mg by mouth daily.  04/28/20   Pyrtle, Lajuan Lines, MD  feeding supplement, ENSURE ENLIVE, (ENSURE ENLIVE) LIQD Take 237 mLs by mouth 3 (three) times daily between meals. 07/08/20   Patrecia Pour, MD  folic acid (FOLVITE) 1 MG tablet Take 1 tablet (1 mg total) by mouth daily. 06/21/20 07/21/20  Antonieta Pert, MD  glucagon (GLUCAGON EMERGENCY) 1 MG injection Inject 1 mg into the skin once as needed (For severe low blood  sugar). 06/14/19   Elayne Snare, MD  guaiFENesin-dextromethorphan (ROBITUSSIN DM) 100-10 MG/5ML syrup Take 5 mLs by mouth every 4 (four) hours as needed for cough (chest congestion). 07/11/20   Patrecia Pour, MD  insulin aspart (NOVOLOG FLEXPEN) 100 UNIT/ML FlexPen Inject 3-5 Units into the skin 3 (three) times daily with meals. Depending on the blood sugar level.    [provider]  insulin degludec (TRESIBA FLEXTOUCH) 100 UNIT/ML FlexTouch Pen INJECT 12 UNITS DAILY. IF FASTING BLOOD SUGARS ARE OVER 140 INCREASE DOSAGE UP TO 15 UNITS DAILY AS DIRECTED. 07/17/20   Elayne Snare, MD  [facility] VERIO test strip USE 1 STRIP TO CHECK GLUCOSE THREE TIMES DAILY 02/24/20   Elayne Snare, MD  oxyCODONE (OXY IR/ROXICODONE) 5 MG immediate release tablet Take 1 tablet (5 mg total) by mouth every 4 (four) hours as needed for severe pain. 06/23/20   Tanner, Lyndon Code., PA-C  oxymetazoline (AFRIN) 0.05 % nasal spray Place 1 spray into both nostrils 2 (two) times daily as needed for congestion.    [provider]  pioglitazone (ACTOS) 15 MG tablet Take 15 mg by mouth daily. 06/26/20   [provider]  polyethylene glycol (MIRALAX / GLYCOLAX) 17 g packet Take 17 g by mouth daily. hold if having  multiple loose stools per day 07/08/20   Patrecia Pour, MD  potassium chloride SA (KLOR-CON) 20 MEQ tablet Take 1 tablet (20 mEq total) by mouth 2 (two) times daily. Patient taking differently: Take 20 mEq by mouth daily.  05/12/20   Tanner, Lyndon Code., PA-C  rosuvastatin (CRESTOR) 5 MG tablet TAKE 1 TABLET BY MOUTH EVERY DAY 07/14/20   Elayne Snare, MD  sacubitril-valsartan (ENTRESTO) 24-26 MG Take 2 tablets by mouth 2 (two) times daily. 07/08/20 08/07/20  Patrecia Pour, MD  senna (SENOKOT) 8.6 MG TABS tablet Take 1 tablet (8.6 mg total) by mouth daily. 06/21/20   Antonieta Pert, MD  thiamine 100 MG tablet Take 1 tablet (100 mg total) by mouth daily. 06/21/20 07/21/20  Antonieta Pert, MD  traMADol (ULTRAM) 50 MG tablet Take 1 tablet (50 mg total) by mouth every 12 (twelve) hours as needed. Patient taking differently: Take 50 mg by mouth every 12 (twelve) hours as needed for moderate pain.  06/21/20   Curt Bears, MD    Allergies    Glycopyrrolate  Review of Systems   Review of Systems  All other systems reviewed and are negative.   Physical Exam Updated Vital Signs BP 139/72   Pulse 65   Temp 98.1 F (36.7 C) (Oral)   Resp 14   SpO2 99%   Physical Exam Vitals and nursing note reviewed. Exam conducted with a chaperone present.  Constitutional:      Appearance: He is ill-appearing.  HENT:     Head: Normocephalic and atraumatic.  Eyes:     General: No scleral icterus.    Conjunctiva/sclera: Conjunctivae normal.  Cardiovascular:     Rate and Rhythm: Normal rate and regular rhythm.     Pulses: Normal pulses.     Heart sounds: Normal heart sounds.  Pulmonary:     Comments: Mildly increased respiratory effort.  Coughing incessantly, particular with inspiration.  Rales appreciated lower lungs. Musculoskeletal:     Cervical back: Normal range of motion and neck supple.     Comments: Right lower leg swelling relative to contralateral leg.  Ecchymoses over calf.  Skin:    General: Skin is dry.      Capillary Refill:  Capillary refill takes less than 2 seconds.     Coloration: Skin is pale.  Neurological:     Mental Status: He is alert and oriented to person, place, and time.     GCS: GCS eye subscore is 4. GCS verbal subscore is 5. GCS motor subscore is 6.  Psychiatric:        Mood and Affect: Mood normal.        Behavior: Behavior normal.        Thought Content: Thought content normal.     ED Results / Procedures / Treatments   Labs (all labs ordered are listed, but only abnormal results are displayed) Labs Reviewed  CBC WITH DIFFERENTIAL/PLATELET - Abnormal; Notable for the following components:      Result Value   WBC 2.8 (*)    RBC 2.89 (*)    Hemoglobin 8.7 (*)    HCT 26.8 (*)    RDW 18.6 (*)    All other components within normal limits  COMPREHENSIVE METABOLIC PANEL - Abnormal; Notable for the following components:   Sodium 133 (*)    Glucose, Bld 134 (*)    Calcium 8.0 (*)    Total Protein 5.0 (*)    Albumin 2.3 (*)    Total Bilirubin 1.3 (*)    All other components within normal limits  BRAIN NATRIURETIC PEPTIDE - Abnormal; Notable for the following components:   B Natriuretic Peptide 250.2 (*)    All other components within normal limits  SARS CORONAVIRUS 2 BY RT PCR (HOSPITAL ORDER, [place] [facility])  URINE CULTURE  URINALYSIS, ROUTINE W REFLEX MICROSCOPIC  TROPONIN I (HIGH SENSITIVITY)  TROPONIN I (HIGH SENSITIVITY)    EKG EKG Interpretation  Date/Time:  Wednesday July 19 2020 14:43:19 EDT Ventricular Rate:  67 PR Interval:    QRS Duration: 98 QT Interval:  470 QTC Calculation: 497 R Axis:   57 Text Interpretation: Sinus rhythm Short PR interval Low voltage, extremity and precordial leads Borderline prolonged QT interval No significant change since last tracing Confirmed by Isla Pence [phone]) on 07/19/2020 5:03:09 PM   Radiology DG Chest 2 View  Result Date: 07/19/2020 CLINICAL DATA:  Cough and shortness of breath  EXAM: CHEST - 2 VIEW COMPARISON:  July 09, 2020 FINDINGS: There is airspace opacity with a degree of consolidation in the posteromedial left base. There is more subtle opacity in the medial right base. Lungs elsewhere are clear. Heart size and pulmonary vascularity are normal. No adenopathy. There is a skin fold on the left. No pneumothorax. No bone lesions. IMPRESSION: Consolidation consistent with pneumonia in the posteromedial left base. More subtle airspace opacity medial right base. Lungs elsewhere clear. Heart size normal. No evident adenopathy. Electronically Signed   By: Lowella Grip III M.D.   On: 07/19/2020 15:47   CT Angio Chest PE W/Cm &/Or Wo Cm  Result Date: 07/19/2020 CLINICAL DATA:  79 year old male with concern for pulmonary embolism. EXAM: CT ANGIOGRAPHY CHEST WITH CONTRAST TECHNIQUE: Multidetector CT imaging of the chest was performed using the standard protocol during bolus administration of intravenous contrast. Multiplanar CT image reconstructions and MIPs were obtained to evaluate the vascular anatomy. CONTRAST:  135mL OMNIPAQUE IOHEXOL 350 MG/ML SOLN COMPARISON:  Chest CT dated 05/16/2020. FINDINGS: Cardiovascular: There is no cardiomegaly. There is trace pericardial effusion. Advanced 3 vessel coronary vascular calcification. There is moderate atherosclerotic calcification of the thoracic aorta. Evaluation of the pulmonary arteries is somewhat limited due to respiratory motion artifact and suboptimal  visualization of the peripheral branches. No central pulmonary artery embolus identified. Mediastinum/Nodes: There is no hilar or mediastinal adenopathy. The esophagus is grossly unremarkable. No mediastinal fluid collection. Lungs/Pleura: Small bilateral pleural effusions with partial consolidative changes of the lower lobes, new since the prior CT and most consistent with pneumonia. There is a cluster of nodular density at the right lung base as well as to a lesser degree involving the  right middle lobe most consistent with infiltrate. Aspiration is not excluded clinical correlation is recommended. There is no pneumothorax. Mucus secretions noted in the trachea and bilateral mainstem bronchus. Upper Abdomen: High attenuating content within the gallbladder, likely vicarious excretion of contrast. Layering sludge or stones are not excluded. Musculoskeletal: Osteopenia with degenerative changes of the spine. There is diffuse subcutaneous edema. No acute osseous pathology. Review of the MIP images confirms the above findings. IMPRESSION: 1. No CT evidence of central pulmonary artery embolus. 2. Small bilateral pleural effusions with partial consolidative changes of the lower lobes most consistent with pneumonia. Aspiration is not excluded. Clinical correlation is recommended. 3. Aortic Atherosclerosis (ICD10-I70.0). Electronically Signed   By: Anner Crete M.D.   On: 07/19/2020 18:01    Procedures Procedures (including critical care time)  Medications Ordered in ED Medications  piperacillin-tazobactam (ZOSYN) IVPB 3.375 g ( Intravenous Stopped 07/19/20 1849)  iohexol (OMNIPAQUE) 350 MG/ML injection 100 mL (100 mLs Intravenous Contrast Given 07/19/20 1732)    ED Course  I have reviewed the triage vital signs and the nursing notes.  Pertinent labs & imaging results that were available during my care of the patient were reviewed by me and considered in my medical decision making (see chart for details).  Clinical Course as of Jul 19 1937  Wed Jul 19, 2020  1711 Spoke with pharmacy and they recommended IV Zosyn as that would also cover for anaerobes.   [GG]  [id number] Spoke with hospitalist who will evaluate patient for admission.     [GG]    Clinical Course User Index [GG] Corena Herter, PA-C   MDM Rules/Calculators/A&P                          Patient was discharged home with Keflex for his Klebsiella pneumonia of left lower lung when he was admitted to the hospital.  Patient  lives at home with his wife and while he has a nurse come during the day, suspect that he would benefit from SNF placement given his current state of health.  States that he has not ambulated since June due to profound weakness.  He saw PT yesterday, but still too weak even with assistive devices.  Wife now at bedside who states that she has been one providing care.  Last chemo treatment was 05/30/2020.    Labs CBC with differential: leukopenia, consistent with patient's CLL and panyctopenia.  There are also vacuolated neutrophils concerning for early sign of progressive bacterial growth.   CMP: Mild hyponatremia consistent with baseline.  Largely unremarkable. BNP: Mildly elevated 250.2. UA: Urine culture: Troponin: 4 COVID-19 PCR: Negative.  Imaging DG chest 2 view: Personally reviewed and demonstrates consolidation in the lower left lobe posteriorly.  This have been noted on prior imaging, unimproved despite Rocephin >> Ancef >> Keflex antibiotics. CT angio PE study: CT is reviewed and there is no evidence of central pulmonary artery embolus.  However, there are small bilateral pleural effusions as well as consolidative changes of lower lobes consistent with pneumonia, aspiration  not excluded.  EKG: Sinus rhythm, no significant changes when compared to prior tracings.  Spoke with pharmacy and they recommended IV Zosyn as that would also cover for anaerobes.  Will consult hospitalist for admission for his weakness, diminished appetite, and exertional SOB in setting of pneumonia that has not improved since last admission.   Spoke with hospitalist, Dr. Dione Plover, who will evaluate patient for admission.     Final Clinical Impression(s) / ED Diagnoses Final diagnoses:  Hospital-acquired bacterial pneumonia    Rx / DC Orders ED Discharge Orders    None       Reita Chard 07/19/20 Ezzard Standing, MD 07/19/20 2011

## 2020-07-19 NOTE — H&P (Signed)
[facility] History and Physical  HARIM BI BDZ:[id number] DOB: [birth year] DOA: 07/19/2020  Referring physician: Krista Blue, PA-C PCP: Elayne Snare, MD   Chief Complaint: cough  HPI: Tony Marshall is a 79 y.o. male with PMH significant for CLL with recent pancytopenia, CHF with 25 to 30% LVEF, HTN, HLD, type II DM, diverticulosis, who presents after recent admission with cough.  Recently complex admission from 06/28/2020 - 07/11/2020. Presented to ED w hematochezia and found to be in DKA. Rectal bleeding 2/2 fecal impaction, tx'd conservatively. Also found to have new Afib w RVR requiring amiodarone drip>PO. Also w new RUE DVT. Not started on anticoagulation due to high GI bleeding risk. Finally also developed LLL PNA w Klebsiella growing on sputum culture. Recommended for discharge to SNF but declined and d/c to home.   Today interviewed with wife at bedside They report that he developed a cough while he was in the hospital and first diagnosed with pneumonia The cough is not changed significantly since they were discharged it is simply not gone away The cough has been productive of sputum they have noticed only a slight increase in the yellow color of the sputum since discharge but otherwise no changes Denies fevers They were seen in PCPs office earlier today and recommended to come to the ED due these symptoms They report that they did not get discharged to a SNF at the end of last hospitalization because they did not like the options they had available Their plan had been to do home PT with wife and daughter providing round-the-clock care  In the ED presents w normal VS, CBC and CMP without significant change from last admission, BNP 250 but no prior baseline in computer and troponin normal, unremarkable ECG. CTPA showed no evidence of PE, small bilateral pleural effusions with possible aspiration PNA bilaterally.  Admitted for physical deconditioning and chest PT.     Review of Systems:  Pertinent positives and negative per HPI, all others reviewed and negative   Past Medical History:  Diagnosis Date  . A-fib ([place])   . CLL (chronic lymphocytic leukemia) ([place]) dx'd 2014   progression 12/2019  . Diabetes mellitus without complication ([place])   . Diverticulosis   . External hemorrhoids   . GERD (gastroesophageal reflux disease)   . Hiatal hernia   . History of colon polyps   . Hyperlipidemia   . Hypertension   . Internal hemorrhoids   . Lymphocytosis   . Schatzki's ring   . Sleep apnea    Past Surgical History:  Procedure Laterality Date  . BLEPHAROPLASTY    . COLONOSCOPY  2012  . FLEXIBLE SIGMOIDOSCOPY N/A 07/05/2020   Procedure: FLEXIBLE SIGMOIDOSCOPY;  Surgeon: Thornton Park, MD;  Location: [facility];  Service: Gastroenterology;  Laterality: N/A;  . HEMORRHOID SURGERY     Sclerotherapy  . KNEE SURGERY Right 2002  . SHOULDER SURGERY Right 2007   Social History:  reports that he has never smoked. He quit smokeless tobacco use about 7 years ago.  His smokeless tobacco use included chew. He reports current alcohol use of about 6.0 standard drinks of alcohol per week. He reports that he does not use drugs.  Allergies  Allergen Reactions  . Glycopyrrolate Rash    Family History  Problem Relation Age of Onset  . Kidney disease Mother   . CAD Maternal Aunt   . Colon cancer Neg Hx   . Esophageal cancer Neg Hx   . Rectal cancer Neg Hx   .  Stomach cancer Neg Hx      Prior to Admission medications   Medication Sig Start Date End Date Taking? Authorizing Provider  acetaminophen (TYLENOL) 500 MG tablet Take 500 mg by mouth every 6 (six) hours as needed for moderate pain.    [provider]  allopurinol (ZYLOPRIM) 100 MG tablet Take 1 tablet (100 mg total) by mouth 2 (two) times daily. 03/28/20   Curt Bears, MD  ALPRAZolam Duanne Moron) 0.25 MG tablet Take 1 tablet (0.25 mg total) by mouth at bedtime as needed for anxiety.  06/21/20   Curt Bears, MD  amiodarone (PACERONE) 200 MG tablet Take 1 tablet (200 mg total) by mouth daily. 07/09/20   Patrecia Pour, MD  carvedilol (COREG) 12.5 MG tablet Take 1 tablet (12.5 mg total) by mouth 2 (two) times daily with a meal. 06/20/20 07/20/20  Antonieta Pert, MD  cephALEXin (KEFLEX) 500 MG capsule Take 1 capsule (500 mg total) by mouth 4 (four) times daily. 07/11/20   Patrecia Pour, MD  dronabinol (MARINOL) 2.5 MG capsule TAKE 1 CAPSULE BY MOUTH TWICE DAILY BEFORE A MEAL 07/15/20   Heilingoetter, Cassandra L, PA-C  esomeprazole (NEXIUM) 40 MG capsule TAKE 1 CAPSULE BY MOUTH EVERY MORNING 30 MINUTES BEFORE BREAKFAST Patient taking differently: Take 40 mg by mouth daily.  04/28/20   Pyrtle, Lajuan Lines, MD  feeding supplement, ENSURE ENLIVE, (ENSURE ENLIVE) LIQD Take 237 mLs by mouth 3 (three) times daily between meals. 07/08/20   Patrecia Pour, MD  folic acid (FOLVITE) 1 MG tablet Take 1 tablet (1 mg total) by mouth daily. 06/21/20 07/21/20  Antonieta Pert, MD  glucagon (GLUCAGON EMERGENCY) 1 MG injection Inject 1 mg into the skin once as needed (For severe low blood sugar). 06/14/19   Elayne Snare, MD  guaiFENesin-dextromethorphan (ROBITUSSIN DM) 100-10 MG/5ML syrup Take 5 mLs by mouth every 4 (four) hours as needed for cough (chest congestion). 07/11/20   Patrecia Pour, MD  insulin aspart (NOVOLOG FLEXPEN) 100 UNIT/ML FlexPen Inject 3-5 Units into the skin 3 (three) times daily with meals. Depending on the blood sugar level.    [provider]  insulin degludec (TRESIBA FLEXTOUCH) 100 UNIT/ML FlexTouch Pen INJECT 12 UNITS DAILY. IF FASTING BLOOD SUGARS ARE OVER 140 INCREASE DOSAGE UP TO 15 UNITS DAILY AS DIRECTED. 07/17/20   Elayne Snare, MD  [facility] VERIO test strip USE 1 STRIP TO CHECK GLUCOSE THREE TIMES DAILY 02/24/20   Elayne Snare, MD  oxyCODONE (OXY IR/ROXICODONE) 5 MG immediate release tablet Take 1 tablet (5 mg total) by mouth every 4 (four) hours as needed for severe pain. 06/23/20   Tanner, Lyndon Code., PA-C  oxymetazoline (AFRIN) 0.05 % nasal spray Place 1 spray into both nostrils 2 (two) times daily as needed for congestion.    [provider]  pioglitazone (ACTOS) 15 MG tablet Take 15 mg by mouth daily. 06/26/20   [provider]  polyethylene glycol (MIRALAX / GLYCOLAX) 17 g packet Take 17 g by mouth daily. hold if having multiple loose stools per day 07/08/20   Patrecia Pour, MD  potassium chloride SA (KLOR-CON) 20 MEQ tablet Take 1 tablet (20 mEq total) by mouth 2 (two) times daily. Patient taking differently: Take 20 mEq by mouth daily.  05/12/20   Tanner, Lyndon Code., PA-C  rosuvastatin (CRESTOR) 5 MG tablet TAKE 1 TABLET BY MOUTH EVERY DAY 07/14/20   Elayne Snare, MD  sacubitril-valsartan (ENTRESTO) 24-26 MG Take 2 tablets by mouth 2 (two)  times daily. 07/08/20 08/07/20  Patrecia Pour, MD  senna (SENOKOT) 8.6 MG TABS tablet Take 1 tablet (8.6 mg total) by mouth daily. 06/21/20   Antonieta Pert, MD  thiamine 100 MG tablet Take 1 tablet (100 mg total) by mouth daily. 06/21/20 07/21/20  Antonieta Pert, MD  traMADol (ULTRAM) 50 MG tablet Take 1 tablet (50 mg total) by mouth every 12 (twelve) hours as needed. Patient taking differently: Take 50 mg by mouth every 12 (twelve) hours as needed for moderate pain.  06/21/20   Curt Bears, MD   Physical Exam: Vitals:   07/19/20 1630 07/19/20 1700 07/19/20 1830 07/19/20 1900  BP: 130/84 (!) 142/72 (!) 130/95 139/72  Pulse: 67 65 78 65  Resp: 13 13 16 14   Temp:      TempSrc:      SpO2: 98% 100% 98% 99%    Wt Readings from Last 3 Encounters:  07/08/20 55.6 kg  06/17/20 64 kg  05/30/20 56.4 kg    General:  Appears calm, cachectic and malnourished Eyes: PERRL, normal lids, irises & conjunctiva ENT: grossly normal hearing, lips & tongue Neck: no LAD, masses or thyromegaly Cardiovascular: RRR, no m/r/g but distant heart sounds. JVD not elevated. No LE edema. Telemetry: SR, no arrhythmias  Respiratory: Normal respiratory effort. Weak cough.  Coarse breath sounds throughout, no wheezes, rales present at L base Abdomen: soft, ntnd Skin: no rash or induration seen on limited exam Musculoskeletal: contracture of R hand, no significant swelling appreciated in RUE Psychiatric: grossly normal mood and affect, speech fluent and appropriate Neurologic: grossly non-focal.          Labs on Admission:  Basic Metabolic Panel: Recent Labs  Lab 07/19/20 1512  NA 133*  K 5.1  CL 99  CO2 27  GLUCOSE 134*  BUN 13  CREATININE 0.67  CALCIUM 8.0*   Liver Function Tests: Recent Labs  Lab 07/19/20 1512  AST 26  ALT 8  ALKPHOS 69  BILITOT 1.3*  PROT 5.0*  ALBUMIN 2.3*   No results for input(s): LIPASE, AMYLASE in the last 168 hours. No results for input(s): AMMONIA in the last 168 hours. CBC: Recent Labs  Lab 07/19/20 1512  WBC 2.8*  NEUTROABS 1.7  HGB 8.7*  HCT 26.8*  MCV 92.7  PLT 157   Cardiac Enzymes: No results for input(s): CKTOTAL, CKMB, CKMBINDEX, TROPONINI in the last 168 hours.  BNP (last 3 results) Recent Labs    07/19/20 1512  BNP 250.2*    ProBNP (last 3 results) No results for input(s): PROBNP in the last 8760 hours.  CBG: No results for input(s): GLUCAP in the last 168 hours.  Radiological Exams on Admission: DG Chest 2 View  Result Date: 07/19/2020 CLINICAL DATA:  Cough and shortness of breath EXAM: CHEST - 2 VIEW COMPARISON:  July 09, 2020 FINDINGS: There is airspace opacity with a degree of consolidation in the posteromedial left base. There is more subtle opacity in the medial right base. Lungs elsewhere are clear. Heart size and pulmonary vascularity are normal. No adenopathy. There is a skin fold on the left. No pneumothorax. No bone lesions. IMPRESSION: Consolidation consistent with pneumonia in the posteromedial left base. More subtle airspace opacity medial right base. Lungs elsewhere clear. Heart size normal. No evident adenopathy. Electronically Signed   By: Lowella Grip III M.D.    On: 07/19/2020 15:47   CT Angio Chest PE W/Cm &/Or Wo Cm  Result Date: 07/19/2020 CLINICAL DATA:  79 year old male with  concern for pulmonary embolism. EXAM: CT ANGIOGRAPHY CHEST WITH CONTRAST TECHNIQUE: Multidetector CT imaging of the chest was performed using the standard protocol during bolus administration of intravenous contrast. Multiplanar CT image reconstructions and MIPs were obtained to evaluate the vascular anatomy. CONTRAST:  111mL OMNIPAQUE IOHEXOL 350 MG/ML SOLN COMPARISON:  Chest CT dated 05/16/2020. FINDINGS: Cardiovascular: There is no cardiomegaly. There is trace pericardial effusion. Advanced 3 vessel coronary vascular calcification. There is moderate atherosclerotic calcification of the thoracic aorta. Evaluation of the pulmonary arteries is somewhat limited due to respiratory motion artifact and suboptimal visualization of the peripheral branches. No central pulmonary artery embolus identified. Mediastinum/Nodes: There is no hilar or mediastinal adenopathy. The esophagus is grossly unremarkable. No mediastinal fluid collection. Lungs/Pleura: Small bilateral pleural effusions with partial consolidative changes of the lower lobes, new since the prior CT and most consistent with pneumonia. There is a cluster of nodular density at the right lung base as well as to a lesser degree involving the right middle lobe most consistent with infiltrate. Aspiration is not excluded clinical correlation is recommended. There is no pneumothorax. Mucus secretions noted in the trachea and bilateral mainstem bronchus. Upper Abdomen: High attenuating content within the gallbladder, likely vicarious excretion of contrast. Layering sludge or stones are not excluded. Musculoskeletal: Osteopenia with degenerative changes of the spine. There is diffuse subcutaneous edema. No acute osseous pathology. Review of the MIP images confirms the above findings. IMPRESSION: 1. No CT evidence of central pulmonary artery embolus.  2. Small bilateral pleural effusions with partial consolidative changes of the lower lobes most consistent with pneumonia. Aspiration is not excluded. Clinical correlation is recommended. 3. Aortic Atherosclerosis (ICD10-I70.0). Electronically Signed   By: Anner Crete M.D.   On: 07/19/2020 18:01    EKG: Independently reviewed.  Sinus rhythm no ischemic changes compared to prior T wave inversions are resolved in anterior leads  Assessment/Plan Active Problems:   Diabetes mellitus (HCC)   Essential hypertension   CLL (chronic lymphocytic leukemia) (H[facility])   Failure to thrive in adult   Acute combined systolic and diastolic heart failure (HCC)   Atrial fibrillation [facility])   Physical deconditioning  #Physical Deconditioning Patient presenting with overall deconditioning weak cough after recent 2-week admission.  Discussed with patient at length that I do not believe he has a new pneumonia rather ongoing symptoms from a resolving pneumonia and the true problem is deconditioning which has not yet been properly addressed in the home environment despite attempts to do so.  Discussed admission for ongoing PT and OT treatment as well as importance of transfer to SNF as part of dispo plan.  Patient and wife are in agreement with this plan. - PT consult - OT consult - Transitions of care consult - Nutrition consult - cont dronabinol BID, ensure enlive TID, folic acid/thiamine supplements  #PNA #Cough Low suspicion for ongoing infectious process.  Patient without systemic signs of infection, given time course of illness not unexpected the patient would continue to have cough and imaging findings at this point given pneumonia only diagnosed approximately a week and a half ago.  Defer further antibiotic treatment at this time, will initiate symptomatic treatments and chest PT.   - tylenol #3 PRN for cough - chest PT - saline nebs - ipratropium nebs - low threshold to resume abx treatment if develops  other signs of infection  #Known Medical Problems DM2: reduce Lantus 12>6u while admitted with aspart ISS, hold oral meds Recent fecal impaction: cont daily miralax HLD: cont statin  Gout: cont allopurinol 100mg  BID Anxiety: cont Xanax 0.25 QHS PRN Afib: cont amiodarone 200mg  daily CHF: cont Carvedilol 12.5mg  BID, Entresto BID GERD: cont PPI  Code Status: Full Code, confirmed DVT Prophylaxis: SCDs Family Communication: wife present at bedside during admission Disposition Plan: Inpatient  Time spent: 32 min  Clarnce Flock MD/[facility]

## 2020-07-20 DIAGNOSIS — R059 Cough, unspecified: Secondary | ICD-10-CM

## 2020-07-20 DIAGNOSIS — R05 Cough: Secondary | ICD-10-CM

## 2020-07-20 DIAGNOSIS — Z515 Encounter for palliative care: Secondary | ICD-10-CM

## 2020-07-20 DIAGNOSIS — E119 Type 2 diabetes mellitus without complications: Secondary | ICD-10-CM

## 2020-07-20 DIAGNOSIS — Z66 Do not resuscitate: Secondary | ICD-10-CM

## 2020-07-20 DIAGNOSIS — I5041 Acute combined systolic (congestive) and diastolic (congestive) heart failure: Secondary | ICD-10-CM

## 2020-07-20 DIAGNOSIS — C9110 Chronic lymphocytic leukemia of B-cell type not having achieved remission: Secondary | ICD-10-CM

## 2020-07-20 DIAGNOSIS — Z7189 Other specified counseling: Secondary | ICD-10-CM

## 2020-07-20 LAB — CBC WITH DIFFERENTIAL/PLATELET
Abs Immature Granulocytes: 0.06 10*3/uL (ref 0.00–0.07)
Basophils Absolute: 0 10*3/uL (ref 0.0–0.1)
Basophils Relative: 0 %
Eosinophils Absolute: 0 10*3/uL (ref 0.0–0.5)
Eosinophils Relative: 1 %
HCT: 27 % — ABNORMAL LOW (ref 39.0–52.0)
Hemoglobin: 8.7 g/dL — ABNORMAL LOW (ref 13.0–17.0)
Immature Granulocytes: 2 %
Lymphocytes Relative: 30 %
Lymphs Abs: 1.1 10*3/uL (ref 0.7–4.0)
MCH: 30.2 pg (ref 26.0–34.0)
MCHC: 32.2 g/dL (ref 30.0–36.0)
MCV: 93.8 fL (ref 80.0–100.0)
Monocytes Absolute: 0.2 10*3/uL (ref 0.1–1.0)
Monocytes Relative: 6 %
Neutro Abs: 2.3 10*3/uL (ref 1.7–7.7)
Neutrophils Relative %: 61 %
Platelets: 224 10*3/uL (ref 150–400)
RBC: 2.88 MIL/uL — ABNORMAL LOW (ref 4.22–5.81)
RDW: 18.8 % — ABNORMAL HIGH (ref 11.5–15.5)
WBC: 3.8 10*3/uL — ABNORMAL LOW (ref 4.0–10.5)
nRBC: 0 % (ref 0.0–0.2)

## 2020-07-20 LAB — CBC
HCT: 24.3 % — ABNORMAL LOW (ref 39.0–52.0)
Hemoglobin: 7.8 g/dL — ABNORMAL LOW (ref 13.0–17.0)
MCH: 30.4 pg (ref 26.0–34.0)
MCHC: 32.1 g/dL (ref 30.0–36.0)
MCV: 94.6 fL (ref 80.0–100.0)
Platelets: 152 10*3/uL (ref 150–400)
RBC: 2.57 MIL/uL — ABNORMAL LOW (ref 4.22–5.81)
RDW: 18.7 % — ABNORMAL HIGH (ref 11.5–15.5)
WBC: 2.8 10*3/uL — ABNORMAL LOW (ref 4.0–10.5)
nRBC: 0 % (ref 0.0–0.2)

## 2020-07-20 LAB — URINE CULTURE: Culture: <10000 — AB

## 2020-07-20 LAB — COMPREHENSIVE METABOLIC PANEL
ALT: 10 U/L (ref 0–44)
AST: 15 U/L (ref 15–41)
Albumin: 2 g/dL — ABNORMAL LOW (ref 3.5–5.0)
Alkaline Phosphatase: 61 U/L (ref 38–126)
Anion gap: 9 (ref 5–15)
BUN: 13 mg/dL (ref 8–23)
CO2: 25 mmol/L (ref 22–32)
Calcium: 8.1 mg/dL — ABNORMAL LOW (ref 8.9–10.3)
Chloride: 98 mmol/L (ref 98–111)
Creatinine, Ser: 0.57 mg/dL — ABNORMAL LOW (ref 0.61–1.24)
GFR calc Af Amer: >60 mL/min (ref >60)
GFR calc non Af Amer: >60 mL/min (ref >60)
Glucose, Bld: 85 mg/dL (ref 70–99)
Potassium: 4 mmol/L (ref 3.5–5.1)
Sodium: 132 mmol/L — ABNORMAL LOW (ref 135–145)
Total Bilirubin: 0.7 mg/dL (ref 0.3–1.2)
Total Protein: 4.4 g/dL — ABNORMAL LOW (ref 6.5–8.1)

## 2020-07-20 LAB — GLUCOSE, CAPILLARY
Glucose-Capillary: 73 mg/dL (ref 70–99)
Glucose-Capillary: 60 mg/dL — ABNORMAL LOW (ref 70–99)
Glucose-Capillary: 67 mg/dL — ABNORMAL LOW (ref 70–99)
Glucose-Capillary: 70 mg/dL (ref 70–99)
Glucose-Capillary: 113 mg/dL — ABNORMAL HIGH (ref 70–99)
Glucose-Capillary: 265 mg/dL — ABNORMAL HIGH (ref 70–99)
Glucose-Capillary: 129 mg/dL — ABNORMAL HIGH (ref 70–99)

## 2020-07-20 MED ORDER — GUAIFENESIN ER 600 MG PO TB12
600.0000 mg | ORAL_TABLET | Freq: Two times a day (BID) | ORAL | Status: DC
  Administered 2020-07-20 – 2020-07-21 (×3): 600 mg via ORAL
  Filled 2020-07-20 (×3): qty 1

## 2020-07-20 MED ORDER — ADULT MULTIVITAMIN W/MINERALS CH
1.0000 | ORAL_TABLET | Freq: Every day | ORAL | Status: DC
  Administered 2020-07-21: 1 via ORAL
  Filled 2020-07-20: qty 1

## 2020-07-20 MED ORDER — MORPHINE SULFATE (CONCENTRATE) 10 MG/0.5ML PO SOLN
10.0000 mg | ORAL | Status: DC | PRN
  Filled 2020-07-20: qty 0.5

## 2020-07-20 MED ORDER — CARVEDILOL 6.25 MG PO TABS
6.2500 mg | ORAL_TABLET | Freq: Two times a day (BID) | ORAL | Status: DC
  Administered 2020-07-21: 6.25 mg via ORAL
  Filled 2020-07-20: qty 1

## 2020-07-20 MED ORDER — HYDROCOD POLST-CPM POLST ER 10-8 MG/5ML PO SUER
5.0000 mL | Freq: Two times a day (BID) | ORAL | Status: DC
  Administered 2020-07-20 – 2020-07-21 (×2): 5 mL via ORAL
  Filled 2020-07-20 (×2): qty 5

## 2020-07-20 MED ORDER — SENNOSIDES-DOCUSATE SODIUM 8.6-50 MG PO TABS: 1.0000 | ORAL_TABLET | Freq: Two times a day (BID) | ORAL | Status: DC

## 2020-07-20 NOTE — Consult Note (Signed)
Consultation Note Date: 07/20/2020   Patient Name: Tony Marshall  DOB: 1941/04/15  MRN: [id number]  Age / Sex: 79 y.o., male  PCP: Tony Snare, MD Referring Physician: Florencia Reasons, MD  Reason for Consultation: Establishing goals of care  HPI/Patient Profile: 79 y.o. male  with past medical history of non-hodgkins lymphoma, combined heart failure (EF25-30%),  HTN, atrial fibrillation, T2DM, and anemia admitted on 07/19/2020 with failure to thrive.   CLL originally diagnosed in 2014. Follows with Dr. Earlie Marshall at the cancer center. He started chemotherapy with rituxumab and bendeka 04/04/20 and has undergone 3 cycles since that time.  Unfortunately, the past few months have been complicated by recurrent illnesses, starting in May 2021 at which time he developed anemia requiring blood transfusions. He was then admitted at [facility] for DKA 06/05/20-06/20/20. Following discharge, he had severe deconditioning and fatigue. He was re-evaluated by oncology 7/13 at which time labs revealed a leukocytosis of close to 100,000. He was readmitted 0/17-7/93 for DKA complicated by hematochezia 2/2 fecal impaction with stercoral ulcer. He was also found to have a subclavian DVT during that admission however, unfortunately, was unable to be anticoagulated due to ongoing hematochezia and anemia. Furthermore, he developed pneumonia over that admission, resulting in an ongoing, bothersome cough. His recent few months have also been complicated by ongoing diarrhea and decreased appetite associated with weight loss. Palliative medicine consulted for goals of care.   Clinical Assessment and Goals of Care:  I have reviewed medical records including EPIC notes, labs and imaging,  examined the patient and met at bedside with Tony Marshall, his wife, Tony Marshall, and daughter Tony Marshall to discuss diagnosis prognosis, GOC, EOL wishes, disposition and options.  I  introduced Palliative Medicine as specialized medical care for people living with serious illness. It focuses on providing relief from the symptoms and stress of a serious illness.   We discussed a brief life review of the patient. He has been married for 45 years. He and his spouse have had two children- a son who is unfortunately deceased, and a daughter, Tony Marshall. He enjoys taking trips to [facility] to go fishing- last year he was able to take three trips.   As far as functional and nutritional status- there has been a decline overall in the last year and exceptionally in the last three months. Zyrell can no longer ambulate, he is wheelchair bound, mainly lives bed to chair at home. He requires assistance with all ADL's. There has been significant decrease in his oral intake and he has lost a great deal of weight.    We discussed his current illness and what it means in the larger context of his on-going co-morbidities.  Natural disease trajectory and expectations at EOL were discussed. We discussed his multiple comorbidities, his picture of failure to thrive, and indicators that he is transitioning towards the end of life.  I attempted to elicit values and goals of care important to the patient.   Most important to Tony Marshall is being at home, being comfortable  and spending time with his family. He wishes to be at home for the remainder of his time, his family is comfortable caring for him, he would like to die at home. Tony Marshall notes that Tony Marshall has told them that Tony Marshall will not likely tolerate more chemotherapy.  Tony Marshall's spouse shares that this illness and trajectory is very hard on their family, and on Tony Marshall. Tony Marshall also has some health issues that their daughter Tony Marshall is very emotional and worried about.   The difference between aggressive medical intervention and comfort care was considered in light of the patient's goals of care.   Advanced directives,  concepts specific to code status, artifical feeding and hydration, and rehospitalization were considered and discussed. He would not want to return to the hospital- he requests DNR code status.  Hospice services outpatient were explained and offered.  Questions and concerns were addressed.  The family was encouraged to call with questions or concerns.   Primary Decision Maker PATIENT with support of spouse    SUMMARY OF RECOMMENDATIONS -Transition of care order for referral for hospice services at home -DNR -Tussionex BID for cough -Liquid morphine concentrate 29m po q2hr prn for breakthrough cough or shortness of breath -No further chemotherapy      Code Status/Advance Care Planning:  DNR  Prognosis:    < 3 months due to failure to thrive, CLL with no further plans for treatment, CHF with significant decline  Discharge Planning: Home with Hospice  Primary Diagnoses: Present on Admission: . Essential hypertension . CLL (chronic lymphocytic leukemia) ([facility] . Failure to thrive in adult . Acute combined systolic and diastolic heart failure (HT[place] . Atrial fibrillation ([place] . Physical deconditioning   I have reviewed the medical record, interviewed the patient and family, and examined the patient. The following aspects are pertinent.  Past Medical History:  Diagnosis Date  . A-fib ([place]   . CLL (chronic lymphocytic leukemia) ([place] dx'd 2014   progression 12/2019  . Diabetes mellitus without complication ([place]   . Diverticulosis   . External hemorrhoids   . GERD (gastroesophageal reflux disease)   . Hiatal hernia   . History of colon polyps   . Hyperlipidemia   . Hypertension   . Internal hemorrhoids   . Lymphocytosis   . Schatzki's ring   . Sleep apnea     Family History  Problem Relation Age of Onset  . Kidney disease Mother   . CAD Maternal Aunt   . Colon cancer Neg Hx   . Esophageal cancer Neg Hx   . Rectal cancer Neg Hx   . Stomach cancer Neg Hx     Scheduled Meds: . allopurinol  100 mg Oral BID  . amiodarone  200 mg Oral Daily  . carvedilol  6.25 mg Oral BID WC  . dronabinol  2.5 mg Oral BID AC  . feeding supplement (ENSURE ENLIVE)  237 mL Oral TID BM  . folic acid  1 mg Oral Daily  . guaiFENesin  600 mg Oral BID  . insulin aspart  0-5 Units Subcutaneous QHS  . insulin aspart  0-9 Units Subcutaneous TID WC  . [START ON 07/21/2020] multivitamin with minerals  1 tablet Oral Daily  . pantoprazole  80 mg Oral Q1200  . polyethylene glycol  17 g Oral Daily  . potassium chloride SA  20 mEq Oral Daily  . rosuvastatin  5 mg Oral Daily  . sacubitril-valsartan  2 tablet Oral BID  . thiamine  100 mg Oral Daily   Continuous Infusions: PRN Meds:.acetaminophen-codeine, ALPRAZolam, guaiFENesin, ipratropium, ondansetron **OR** ondansetron (ZOFRAN) IV, polyethylene glycol, sodium chloride Medications Prior to Admission:  Prior to Admission medications   Medication Sig Start Date End Date Taking? Authorizing Provider  acetaminophen (TYLENOL) 500 MG tablet Take 500 mg by mouth every 6 (six) hours as needed for moderate pain.   Yes [provider]  allopurinol (ZYLOPRIM) 100 MG tablet Take 1 tablet (100 mg total) by mouth 2 (two) times daily. 03/28/20  Yes Curt Bears, MD  ALPRAZolam Duanne Moron) 0.25 MG tablet Take 1 tablet (0.25 mg total) by mouth at bedtime as needed for anxiety. 06/21/20  Yes Curt Bears, MD  amiodarone (PACERONE) 200 MG tablet Take 1 tablet (200 mg total) by mouth daily. 07/09/20  Yes Patrecia Pour, MD  carvedilol (COREG) 12.5 MG tablet Take 1 tablet (12.5 mg total) by mouth 2 (two) times daily with a meal. 06/20/20 07/20/20 Yes Kc, Maren Beach, MD  dronabinol (MARINOL) 2.5 MG capsule TAKE 1 CAPSULE BY MOUTH TWICE DAILY BEFORE A MEAL Patient taking differently: Take 2.5 mg by mouth 2 (two) times daily before lunch and supper.  07/15/20  Yes Heilingoetter, Cassandra L, PA-C  esomeprazole (NEXIUM) 40 MG capsule TAKE 1 CAPSULE BY  MOUTH EVERY MORNING 30 MINUTES BEFORE BREAKFAST Patient taking differently: Take 40 mg by mouth daily.  04/28/20  Yes Pyrtle, Lajuan Lines, MD  feeding supplement, ENSURE ENLIVE, (ENSURE ENLIVE) LIQD Take 237 mLs by mouth 3 (three) times daily between meals. 07/08/20  Yes Patrecia Pour, MD  folic acid (FOLVITE) 1 MG tablet Take 1 tablet (1 mg total) by mouth daily. 06/21/20 07/21/20 Yes Antonieta Pert, MD  glucagon (GLUCAGON EMERGENCY) 1 MG injection Inject 1 mg into the skin once as needed (For severe low blood sugar). 06/14/19  Yes Tony Snare, MD  guaiFENesin-dextromethorphan [facility] DM) 100-10 MG/5ML syrup Take 5 mLs by mouth every 4 (four) hours as needed for cough (chest congestion). 07/11/20  Yes Patrecia Pour, MD  insulin aspart (NOVOLOG FLEXPEN) 100 UNIT/ML FlexPen Inject 3-5 Units into the skin 3 (three) times daily with meals. Depending on the blood sugar level.   Yes [provider]  insulin degludec (TRESIBA FLEXTOUCH) 100 UNIT/ML FlexTouch Pen INJECT 12 UNITS DAILY. IF FASTING BLOOD SUGARS ARE OVER 140 INCREASE DOSAGE UP TO 15 UNITS DAILY AS DIRECTED. 07/17/20  Yes Tony Snare, MD  [facility] VERIO test strip USE 1 STRIP TO CHECK GLUCOSE THREE TIMES DAILY 02/24/20  Yes Tony Snare, MD  oxyCODONE (OXY IR/ROXICODONE) 5 MG immediate release tablet Take 1 tablet (5 mg total) by mouth every 4 (four) hours as needed for severe pain. 06/23/20  Yes Tanner, Lyndon Code., PA-C  oxymetazoline (AFRIN) 0.05 % nasal spray Place 1 spray into both nostrils 2 (two) times daily as needed for congestion.   Yes [provider]  pioglitazone (ACTOS) 15 MG tablet Take 15 mg by mouth daily. 06/26/20  Yes [provider]  polyethylene glycol (MIRALAX / GLYCOLAX) 17 g packet Take 17 g by mouth daily. hold if having multiple loose stools per day 07/08/20  Yes Patrecia Pour, MD  potassium chloride SA (KLOR-CON) 20 MEQ tablet Take 1 tablet (20 mEq total) by mouth 2 (two) times daily. Patient taking differently: Take 20  mEq by mouth daily.  05/12/20  Yes Tanner, Lyndon Code., PA-C  rosuvastatin (CRESTOR) 5 MG tablet TAKE 1 TABLET BY MOUTH EVERY DAY Patient taking differently: Take 5 mg by mouth  daily.  07/14/20  Yes Tony Snare, MD  sacubitril-valsartan (ENTRESTO) 24-26 MG Take 2 tablets by mouth 2 (two) times daily. 07/08/20 08/07/20 Yes Patrecia Pour, MD  thiamine 100 MG tablet Take 1 tablet (100 mg total) by mouth daily. 06/21/20 07/21/20 Yes Antonieta Pert, MD  traMADol (ULTRAM) 50 MG tablet Take 1 tablet (50 mg total) by mouth every 12 (twelve) hours as needed. Patient taking differently: Take 50 mg by mouth every 12 (twelve) hours as needed for moderate pain.  06/21/20  Yes Curt Bears, MD  senna (SENOKOT) 8.6 MG TABS tablet Take 1 tablet (8.6 mg total) by mouth daily. Patient not taking: Reported on 07/19/2020 06/21/20   Antonieta Pert, MD   Allergies  Allergen Reactions  . Glycopyrrolate Rash   Review of Systems  Constitutional: Positive for activity change, appetite change and fatigue.  Respiratory: Positive for cough.   Cardiovascular: Positive for leg swelling.  Musculoskeletal: Positive for back pain.    Physical Exam Vitals and nursing note reviewed.  Cardiovascular:     Rate and Rhythm: Normal rate.  Pulmonary:     Effort: Pulmonary effort is normal.  Musculoskeletal:     Comments: Generalized weakness     Vital Signs: BP 103/60 (BP Location: Right Arm)   Pulse (!) 59   Temp 97.8 F (36.6 C) (Oral)   Resp 16   Ht 5' 7"  (1.702 m)   Wt 52.6 kg   SpO2 100%   BMI 18.16 kg/m  Pain Scale: 0-10 POSS *See Group Information*: 1-Acceptable,Awake and alert Pain Score: Asleep   SpO2: SpO2: 100 % O2 Device:SpO2: 100 % O2 Flow Rate: .O2 Flow Rate (L/min): 2 L/min  IO: Intake/output summary:   Intake/Output Summary (Last 24 hours) at 07/20/2020 1459 Last data filed at 07/20/2020 1345 Gross per 24 hour  Intake 1103.01 ml  Output 250 ml  Net 853.01 ml    LBM: Last BM Date: 07/19/20 Baseline Weight:  Weight: 52.6 kg Most recent weight: Weight: 52.6 kg     Palliative Assessment/Data: PPS: 30%     Thank you for this consult. Palliative medicine will continue to follow and assist as needed.   Time In: 1430 Time Out: 1600 Time Total: 90 minutes Greater than 50%  of this time was spent counseling and coordinating care related to the above assessment and plan.  Signed by:  Mitzi Hansen, MD Internal Medicine Resident   Mariana Kaufman, AGNP-C Palliative Medicine    Please contact Palliative Medicine Team phone at [phone] for questions and concerns.  For individual provider: See Shea Evans

## 2020-07-20 NOTE — Progress Notes (Signed)
Initial Nutrition Assessment  DOCUMENTATION CODES:   Severe malnutrition in context of chronic illness, Underweight  INTERVENTION:   -Ensure Enlive po TID, each supplement provides 350 kcal and 20 grams of protein -Multivitamin with minerals daily  NUTRITION DIAGNOSIS:   Severe Malnutrition related to chronic illness, cancer and cancer related treatments as evidenced by moderate fat depletion, severe muscle depletion, percent weight loss, energy intake < or equal to 75% for > or equal to 1 month.  GOAL:   Patient will meet greater than or equal to 90% of their needs  MONITOR:   PO intake, Supplement acceptance, Labs, Weight trends, I & O's  REASON FOR ASSESSMENT:   Consult Assessment of nutrition requirement/status  ASSESSMENT:   79 y.o. male with PMH significant for CLL with recent pancytopenia, CHF with 25 to 30% LVEF, HTN, HLD, type II DM, diverticulosis, who presents after recent admission with cough.  Patient in room with no family at bedside. Pt reports poor appetite for "a long time". During previous admission in July, pt was eating poorly then. Pt states his swallowing is "okay" but he just coughs a lot. Pt does not like Ensure or [employer] supplements but will drink them. Pt has had 1 Ensure so far today. Not very hungry. Will continue Ensure supplements.   Will monitor for speech eval and palliative care decisions.  Per weight records, pt has lost 39 lbs since 4/14 (25% wt loss x 3.5 months, significant for time frame). Severe malnutrition continues with significant weight loss, fat/muscle depletions and continued poor appetite.   Medications: Marinol, Folic acid, Miralax, KLOR-CON, Thiamine Labs reviewed: CBGs: 113-265 Low Na  NUTRITION - FOCUSED PHYSICAL EXAM:    Most Recent Value  Orbital Region Moderate depletion  Upper Arm Region Severe depletion  Thoracic and Lumbar Region Unable to assess  Buccal Region Moderate depletion  Temple Region Mild  depletion  Clavicle Bone Region Severe depletion  Clavicle and Acromion Bone Region Severe depletion  Scapular Bone Region Unable to assess  Dorsal Hand Moderate depletion  Patellar Region Severe depletion  Anterior Thigh Region Unable to assess  Posterior Calf Region Severe depletion  Hair Reviewed  Eyes Reviewed  Mouth Reviewed  Skin Reviewed       Diet Order:   Diet Order            Diet heart healthy/carb modified Room service appropriate? Yes; Fluid consistency: Thin  Diet effective now                 EDUCATION NEEDS:   No education needs have been identified at this time  Skin:  Skin Assessment: Reviewed RN Assessment  Last BM:  8/4  Height:   Ht Readings from Last 1 Encounters:  07/19/20 5\' 7"  (1.702 m)    Weight:   Wt Readings from Last 1 Encounters:  07/19/20 52.6 kg   BMI:  Body mass index is 18.16 kg/m.  Estimated Nutritional Needs:   Kcal:  1850-2050  Protein:  100-115g  Fluid:  2.1L/day   Clayton Bibles, MS, RD, LDN Inpatient Clinical Dietitian Contact information available via Amion

## 2020-07-20 NOTE — Evaluation (Signed)
Occupational Therapy Evaluation Patient Details Name: Tony Marshall MRN: [id number] DOB: Apr 29, 1941 Today's Date: 07/20/2020    History of Present Illness 79 y.o. male with PMH significant for CLL with recent pancytopenia, CHF with 25 to 30% LVEF, HTN, HLD, type II DM, diverticulosis, who presents after recent admission 06/28/20-07/11/20 (for DKA, hematochezia, PNA, RUE DVT, declined SNF) now re-admitted with cough.   Clinical Impression   Pt admitted with the above. Pt currently with functional limitations due to the deficits listed below (see OT Problem List).  Pt will benefit from skilled OT to increase their safety and independence with ADL and functional mobility for ADL to facilitate discharge to venue listed below.   Pt unaware of he was sitting in a bed of stool.  Red tint noted- nurse called.      Follow Up Recommendations  SNF    Equipment Recommendations  Other (comment) (wife wants a wheelchair)    Recommendations for Other Services       Precautions / Restrictions Precautions Precautions: Fall Restrictions Weight Bearing Restrictions: No      Mobility Bed Mobility Overal bed mobility: Needs Assistance Bed Mobility: Supine to Sit Rolling: Mod assist         General bed mobility comments: up at edge of bed  Transfers Overall transfer level: Needs assistance Equipment used: 2 person hand held assist;Rolling walker (2 wheeled) Transfers: Sit to/from [employer] Transfers Sit to Stand: +2 physical assistance;Max assist Stand pivot transfers: Max assist;+2 physical assistance       General transfer comment: sit to stand from bed with HHA of 2, pt pivoted to recliner, then sit to stand from recliner with RW; pt stood ~30 seconds with RW, tolerance limited by fatigue, VSS, pt on 2L O2 during activity. Pt likely fatigued from lengthy cleanup process as he was soiled from very large BM at start of session.    Balance Overall balance assessment: Needs  assistance Sitting-balance support: Feet supported;Bilateral upper extremity supported Sitting balance-Leahy Scale: Fair     Standing balance support: Bilateral upper extremity supported;During functional activity Standing balance-Leahy Scale: Poor Standing balance comment: reliant on external support with RW                           ADL either performed or assessed with clinical judgement   ADL Overall ADL's : Needs assistance/impaired Eating/Feeding: Set up;Supervision/ safety;Sitting   Grooming: Sitting;Set up;Wash/dry hands   Upper Body Bathing: Set up;Sitting;Supervision/ safety   Lower Body Bathing: Sit to/from stand;Maximal assistance   Upper Body Dressing : Minimal assistance;Sitting   Lower Body Dressing: Sit to/from stand;Maximal assistance   Toilet Transfer: Stand-pivot;Cueing for safety;Cueing for sequencing;Maximal assistance   Toileting- Clothing Manipulation and Hygiene: Maximal assistance;Sit to/from stand;Cueing for safety;+2 for safety/equipment;+2 for physical assistance       Functional mobility during ADLs: Rolling walker;Cueing for safety;Maximal assistance;+2 for safety/equipment;+2 for physical assistance       Vision   Vision Assessment?: No apparent visual deficits     Perception     Praxis      Pertinent Vitals/Pain Pain Assessment: No/denies pain Faces Pain Scale: Hurts little more Pain Location: Lower back.  Denied RLE pain. Pain Descriptors / Indicators: Sore;Aching Pain Intervention(s): Limited activity within patient's tolerance     Hand Dominance Right   Extremity/Trunk Assessment Upper Extremity Assessment Upper Extremity Assessment: Generalized weakness   Lower Extremity Assessment Lower Extremity Assessment: Generalized weakness   Cervical / Trunk  Assessment Cervical / Trunk Assessment: Kyphotic   Communication Communication Communication: HOH   Cognition Arousal/Alertness: Awake/alert Behavior During  Therapy: Flat affect Overall Cognitive Status: Within Functional Limits for tasks assessed Area of Impairment: Awareness                               General Comments: At start of session, pt lying in very large amount of stool but had limited awareness of this              Home Living Family/patient expects to be discharged to:: Private residence Living Arrangements: Spouse/significant other Available Help at Discharge: Family Type of Home: House Home Access: Level entry     [place]: One level     Bathroom Shower/Tub: Occupational psychologist: Standard (riser over it) Bathroom Accessibility: Yes   Home Equipment: Environmental consultant - 2 wheels;Bedside commode;Toilet riser;Walker - 4 wheels;Hospital bed   Additional Comments: sunk in living room (3 steps to kitchen), pt does not have to go down or up steps to get to living room where his bed is.      Prior Functioning/Environment Level of Independence: Independent  Gait / Transfers Assistance Needed: pt has not required use of RW or any AD for mobility prior to recent hospitalization in June due to Rt LE cellulitis and sepsis. Pt and wife report it has been ~ 5 weeks since he has walked a functional distance in home.     Comments: retired from working with Engineer, [employer] (consulting); enjoys fishing/hunting and playing golf        OT Problem List: Decreased strength;Decreased range of motion;Decreased activity tolerance;Impaired balance (sitting and/or standing);Decreased coordination;Decreased cognition;Decreased safety awareness;Decreased knowledge of use of DME or AE;Cardiopulmonary status limiting activity;Pain;Increased edema      OT Treatment/Interventions: Self-care/ADL training;Therapeutic exercise;Neuromuscular education;Energy conservation;DME and/or AE instruction;Therapeutic activities;Cognitive remediation/compensation;Patient/family education;Balance training    OT Goals(Current goals can be found in the care  plan section) Acute Rehab OT Goals Patient Stated Goal: Decreased pain and increased endurance OT Goal Formulation: With patient/family Time For Goal Achievement: 08/24/20  OT Frequency: Min 2X/week   Barriers to D/C:            Co-evaluation   Reason for Co-Treatment: For patient/therapist safety;To address functional/ADL transfers PT goals addressed during session: Mobility/safety with mobility;Proper use of DME;Balance        AM-PAC OT "6 Clicks" Daily Activity     Outcome Measure Help from another person eating meals?: A Little Help from another person taking care of personal grooming?: A Lot Help from another person toileting, which includes using toliet, bedpan, or urinal?: A Lot (condom cath) Help from another person bathing (including washing, rinsing, drying)?: A Lot Help from another person to put on and taking off regular upper body clothing?: A Lot Help from another person to put on and taking off regular lower body clothing?: A Lot 6 Click Score: 13   End of Session Equipment Utilized During Treatment: Surveyor, mining Communication: Mobility status  Activity Tolerance: Patient limited by fatigue Patient left: with call bell/phone within reach;with family/visitor present;in chair;with chair alarm set  OT Visit Diagnosis: Unsteadiness on feet (R26.81);Other abnormalities of gait and mobility (R26.89);Muscle weakness (generalized) (M62.81);Other symptoms and signs involving cognitive function                Time: 1312-1340 OT Time Calculation (min): 28 min Charges:  OT General Charges $OT Visit:  1 Visit OT Evaluation $OT Eval Moderate Complexity: 1 Mod OT Treatments $Self Care/Home Management : 8-22 mins  Kari Baars, OT Acute Rehabilitation Services [phone] Office- [phone], Edwena Felty D 07/20/2020, 5:57 PM

## 2020-07-20 NOTE — Progress Notes (Signed)
NT called Probation officer in patients room. Patient had blood on his pad. Patient had incontinent episode of stool. Unable to tell where the blood came from. MD Dr. Florencia Reasons paged

## 2020-07-20 NOTE — Progress Notes (Signed)
Hypoglycemic Event  CBG: 60 and 67  Treatment: Symptoms: No symptoms at this time.   Follow-up CBG: Time: 7:14 AM CBG Result: 113  Possible Reasons for Event: Per patient's daughter, patient has not been eating and drinking much.   Comments/MD notified: Notified [facility] floor coverage Lang Snow, FNP) at 6:30 am to inform about low CBGs and my actions.   Actions included: Chocolate ensure 240 cc given and 120 cc of orange juice. Orange juice and apple juice had been given to get the CBG up to 67. Chocolate ensure and another cup of orange juice got CBG up to 113.     Anda Kraft

## 2020-07-20 NOTE — Plan of Care (Signed)
  Problem: Health Behavior/Discharge Planning: Goal: Ability to manage health-related needs will improve Outcome: Progressing   Problem: Pain Managment: Goal: General experience of comfort will improve Outcome: Progressing   Problem: Safety: Goal: Ability to remain free from injury will improve Outcome: Progressing   

## 2020-07-20 NOTE — Evaluation (Signed)
Clinical/Bedside Swallow Evaluation Patient Details  Name: Tony Marshall MRN: [id number] Date of Birth: 1941-10-03  Today's Date: 07/20/2020 Time: SLP Start Time (ACUTE ONLY): 0630 SLP Stop Time (ACUTE ONLY): 1625 SLP Time Calculation (min) (ACUTE ONLY): 20 min  Past Medical History:  Past Medical History:  Diagnosis Date  . A-fib ([place])   . CLL (chronic lymphocytic leukemia) ([place]) dx'd 2014   progression 12/2019  . Diabetes mellitus without complication ([place])   . Diverticulosis   . External hemorrhoids   . GERD (gastroesophageal reflux disease)   . Hiatal hernia   . History of colon polyps   . Hyperlipidemia   . Hypertension   . Internal hemorrhoids   . Lymphocytosis   . Schatzki's ring   . Sleep apnea    Past Surgical History:  Past Surgical History:  Procedure Laterality Date  . BLEPHAROPLASTY    . COLONOSCOPY  2012  . FLEXIBLE SIGMOIDOSCOPY N/A 07/05/2020   Procedure: FLEXIBLE SIGMOIDOSCOPY;  Surgeon: Thornton Park, MD;  Location: [facility];  Service: Gastroenterology;  Laterality: N/A;  . HEMORRHOID SURGERY     Sclerotherapy  . KNEE SURGERY Right 2002  . SHOULDER SURGERY Right 2007   HPI:  79 y.o. male with PMH significant for CLL with recent pancytopenia, CHF with 25 to 30% LVEF, HTN, HLD, type II DM, GERD, remote hx of EGD with dilation, diverticulosis, who presents after recent admission 06/28/20-07/11/20 (for DKA, hematochezia, PNA, RUE DVT, declined SNF) now re-admitted with cough. Chest CT 07/19/20: "Small bilateral pleural effusions with partial consolidative changes of the lower lobes, new since the prior CT and most consistent with pneumonia. There is a cluster of nodular density at the right lung base as well as to a lesser degree involving the right middle lobe most consistent with infiltrate. Aspiration is not excluded clinical correlation is recommended."   Assessment / Plan / Recommendation Clinical Impression  Pt participated in clinical swallow  assessment.  His wife was present.  There were no s/s of dysphagia-related aspiration.  Oral mechanism exam was normal.  There was adequate mastication of solids, brisk swallow response, and no s/s of aspiration despite being taxed with successive swallows of thin liquids and mixed solid/liquid boluses.  There was intermittent baseline coughing noted throughout exam, but it was not directly associated with PO intake, and there were a number of times pt consumed large quantities of water with no resulting cough or wet voice.  There was adequate coordination of swallow/respiratory cycles.  Pt reports no recurrence of globus sensation that was present prior to his last EGD and dilatation.  Recommend that he remain on current regular diet, thin liquids.  Give meds in puree if they are easier for him to manage.  No SLP f/u is needed - our service will sign off.  SLP Visit Diagnosis: Dysphagia, unspecified (R13.10)    Aspiration Risk  No limitations    Diet Recommendation   regular solids, thin liquids  Medication Administration: Whole meds with liquid    Other  Recommendations Oral Care Recommendations: Oral care BID   Follow up Recommendations None        Swallow Study   General HPI: 79 y.o. male with PMH significant for CLL with recent pancytopenia, CHF with 25 to 30% LVEF, HTN, HLD, type II DM, GERD, remote hx of EGD with dilation, diverticulosis, who presents after recent admission 06/28/20-07/11/20 (for DKA, hematochezia, PNA, RUE DVT, declined SNF) now re-admitted with cough. Chest CT 07/19/20: "Small bilateral pleural effusions with  partial consolidative changes of the lower lobes, new since the prior CT and most consistent with pneumonia. There is a cluster of nodular density at the right lung base as well as to a lesser degree involving the right middle lobe most consistent with infiltrate. Aspiration is not excluded clinical correlation is recommended." Type of Study: Bedside Swallow  Evaluation Previous Swallow Assessment: no Diet Prior to this Study: Regular;Thin liquids Temperature Spikes Noted: No Respiratory Status: Room air History of Recent Intubation: No Behavior/Cognition: Alert;Cooperative;Pleasant mood Oral Cavity Assessment: Within Functional Limits Oral Care Completed by SLP: No Oral Cavity - Dentition: Adequate natural dentition Vision: Functional for self-feeding Self-Feeding Abilities: Able to feed self Patient Positioning: Upright in chair Baseline Vocal Quality: Normal Volitional Cough: Strong Volitional Swallow: Able to elicit    Oral/Motor/Sensory Function Overall Oral Motor/Sensory Function: Within functional limits   Ice Chips Ice chips: Within functional limits   Thin Liquid Thin Liquid: Within functional limits    Nectar Thick Nectar Thick Liquid: Not tested   Honey Thick Honey Thick Liquid: Not tested   Puree Puree: Not tested   Solid     Solid: Within functional limits      Juan Quam Laurice 07/20/2020,4:25 PM Estill Bamberg L. Tivis Ringer, [place] Office number [phone] Pager [phone]

## 2020-07-20 NOTE — Progress Notes (Signed)
PT Cancellation Note  Patient Details Name: Tony Marshall MRN: [id number] DOB: May 09, 1941   Cancelled Treatment:    Reason Eval/Treat Not Completed: Fatigue/lethargy limiting ability to participate (pt sleeping very soundly, did not arouse to verbal/tactile stimulii. Will follow.)  Philomena Doheny PT 07/20/2020  Acute Rehabilitation Services Pager [phone] Office [phone]

## 2020-07-20 NOTE — Progress Notes (Addendum)
PROGRESS NOTE    Tony Marshall  FYB:[id number] DOB: 1941-02-21 DOA: 07/19/2020 PCP: Elayne Snare, MD    Chief Complaint  Patient presents with  . Cough  . Shortness of Breath    Brief Narrative:  Chief Complaint: cough  HPI: Tony Marshall is a 79 y.o. male with PMH significant for CLL with recent pancytopenia, CHF with 25 to 30% LVEF, HTN, HLD, type II DM, diverticulosis, who presents after recent admission with cough.  Recently complex admission from 06/28/2020 - 07/11/2020. Presented to ED w hematochezia and found to be in DKA. Rectal bleeding 2/2 fecal impaction, tx'd conservatively. Also found to have new Afib w RVR requiring amiodarone drip>PO. Also w new RUE DVT. Not started on anticoagulation due to high GI bleeding risk. Finally also developed LLL PNA w Klebsiella growing on sputum culture. Recommended for discharge to SNF but declined and d/c to home.   Today interviewed with wife at bedside They report that he developed a cough while he was in the hospital and first diagnosed with pneumonia The cough is not changed significantly since they were discharged it is simply not gone away The cough has been productive of sputum they have noticed only a slight increase in the yellow color of the sputum since discharge but otherwise no changes Denies fevers They were seen in PCPs office earlier today and recommended to come to the ED due these symptoms They report that they did not get discharged to a SNF at the end of last hospitalization because they did not like the options they had available Their plan had been to do home PT with wife and daughter providing round-the-clock care  In the ED presents w normal VS, CBC and CMP without significant change from last admission, BNP 250 but no prior baseline in computer and troponin normal, unremarkable ECG. CTPA showed no evidence of PE, small bilateral pleural effusions with possible aspiration PNA bilaterally.  Admitted for  physical deconditioning and chest PT.   Subjective:  Weak cough, denies chest pain, not in respiratory distress, no hypoxia, but report oxygen supplement helped him feel better Report ongoing weight loss, grossly weakness, need to people get him out of bed at home  Assessment & Plan:   Active Problems:   Diabetes mellitus ([place])   Essential hypertension   CLL (chronic lymphocytic leukemia) ([facility])   Failure to thrive in adult   Acute combined systolic and diastolic heart failure ([facility])   Atrial fibrillation ([facility])   Physical deconditioning   Cough and short of breath -Chest x-ray showed "Consolidation consistent with pneumonia in the posteromedial left base. More subtle airspace opacity medial right base. Lungs elsewhere clear. Heart size normal. No evident adenopathy." -There is no fever, currently.not appear septic - check procalcitonin, order speech eval, Mucinex, chest PT  Chronic systolic CHF, diagnosed in June 2021(EF 25 to 30%) -Continue Coreg and Entresto, blood pressure and renal function stable Currently euvolemic  Paroxysmal A. Fib Continue Coreg and amiodarone, not on anticoagulation Currently in sinus rhythm  Recent history of right upper extremity DVT, not started on anticoagulation due to high GI bleeding risk and pancytopenia  Recent h/o GI bleed a few weeks ago S/p flexible sigmoidoscopy on July 21 "Non-bleeding external hemorrhoids found on perianal exam A single (solitary) ulcer in the rectum consistents with stercoral ulcer - the source of recent Bleeding." Addendum 5 PM: RN reported patient had a large brown stool with a rim of blood around it on the pad Vital signs  are stable, monitor h/h  Insulin-dependent type 2 diabetes, with hypoglycemia episode in hospital Stop lantus, continue SSI, continue hypoglycemia protocol  CLL with pancytopenia, not able to tolerate chemotherapy Was admitted to the hospital with septic shock , DKA, left lower lobe  pneumonia, GI bleed, right upper extremity DVT after chemotherapy   FTT/progressive weakness /ongoing weight loss /malnutrition Body mass index is 18.16 kg/m.  Overall poor prognosis, patient agreed to talk to palliative care.  DVT prophylaxis: SCDs Start: 07/19/20 1950   Code Status: Full Family Communication:  will talk to wife with his permission Disposition:   Status is: Inpatient    Dispo: The patient is from: Home              Anticipated d/c is to: To be determined              Anticipated d/c date is: To be determined                Consultants:   Palliative care  Procedures:   None  Antimicrobials:   None     Objective: Vitals:   07/19/20 2235 07/19/20 2314 07/20/20 0142 07/20/20 0607  BP: (!) 150/77  116/64 (!) 151/77  Pulse: 82  66 68  Resp: 18  16 16   Temp: 98.3 F (36.8 C)  (!) 97.5 F (36.4 C) 98.5 F (36.9 C)  TempSrc: Oral  Oral Oral  SpO2: 99% 96% 100% 100%  Weight:      Height:        Intake/Output Summary (Last 24 hours) at 07/20/2020 0823 Last data filed at 07/20/2020 0630 Gross per 24 hour  Intake 1043.01 ml  Output 250 ml  Net 793.01 ml   Filed Weights   07/19/20 2233  Weight: 52.6 kg    Examination:  General exam: Very frail ,calm, NAD Respiratory system: Positive for rhonchi , no wheezing , no rales . Respiratory effort normal. Cardiovascular system: S1 & S2 heard, RRR. No JVD, no murmur, No pedal edema. Gastrointestinal system: Abdomen is nondistended, soft and nontender. No organomegaly or masses felt. Normal bowel sounds heard. Central nervous system: Alert and oriented. No focal neurological deficits. Extremities: Generalized weakness Skin: No rashes, lesions or ulcers Psychiatry: Judgement and insight appear normal. Mood & affect appropriate.     Data Reviewed: I have personally reviewed following labs and imaging studies  CBC: Recent Labs  Lab 07/19/20 1512 07/20/20 0512  WBC 2.8* 2.8*  NEUTROABS 1.7  --    HGB 8.7* 7.8*  HCT 26.8* 24.3*  MCV 92.7 94.6  PLT 157 818    Basic Metabolic Panel: Recent Labs  Lab 07/19/20 1512 07/20/20 0512  NA 133* 132*  K 5.1 4.0  CL 99 98  CO2 27 25  GLUCOSE 134* 85  BUN 13 13  CREATININE 0.67 0.57*  CALCIUM 8.0* 8.1*    GFR: Estimated Creatinine Clearance: 58.4 mL/min (A) (by C-G formula based on SCr of 0.57 mg/dL (L)).  Liver Function Tests: Recent Labs  Lab 07/19/20 1512 07/20/20 0512  AST 26 15  ALT 8 10  ALKPHOS 69 61  BILITOT 1.3* 0.7  PROT 5.0* 4.4*  ALBUMIN 2.3* 2.0*    CBG: Recent Labs  Lab 07/20/20 0345 07/20/20 0555 07/20/20 0557 07/20/20 0610 07/20/20 0714  GLUCAP 73 70 60* 67* 113*     Recent Results (from the past 240 hour(s))  SARS Coronavirus 2 by RT PCR (hospital order, performed in [facility] hospital lab) Nasopharyngeal Nasopharyngeal Swab  Status: None   Collection Time: 07/19/20  3:30 PM   Specimen: Nasopharyngeal Swab  Result Value Ref Range Status   SARS Coronavirus 2 NEGATIVE NEGATIVE Final    Comment: (NOTE) SARS-CoV-2 target nucleic acids are NOT DETECTED.  The SARS-CoV-2 RNA is generally detectable in upper and lower respiratory specimens during the acute phase of infection. The lowest concentration of SARS-CoV-2 viral copies this assay can detect is 250 copies / mL. A negative result does not preclude SARS-CoV-2 infection and should not be used as the sole basis for treatment or other patient management decisions.  A negative result may occur with improper specimen collection / handling, submission of specimen other than nasopharyngeal swab, presence of viral mutation(s) within the areas targeted by this assay, and inadequate number of viral copies (<250 copies / mL). A negative result must be combined with clinical observations, patient history, and epidemiological information.  Fact Sheet for Patients:   StrictlyIdeas.no  Fact Sheet for Healthcare  Providers: BankingDealers.co.za  This test is not yet approved or  cleared by the [place] FDA and has been authorized for detection and/or diagnosis of SARS-CoV-2 by FDA under an Emergency Use Authorization (EUA).  This EUA will remain in effect (meaning this test can be used) for the duration of the COVID-19 declaration under Section 564(b)(1) of the Act, 21 U.S.C. section 360bbb-3(b)(1), unless the authorization is terminated or revoked sooner.  Performed at [facility], [place] [address] [place], [place] [address]          Radiology Studies: DG Chest 2 View  Result Date: 07/19/2020 CLINICAL DATA:  Cough and shortness of breath EXAM: CHEST - 2 VIEW COMPARISON:  July 09, 2020 FINDINGS: There is airspace opacity with a degree of consolidation in the posteromedial left base. There is more subtle opacity in the medial right base. Lungs elsewhere are clear. Heart size and pulmonary vascularity are normal. No adenopathy. There is a skin fold on the left. No pneumothorax. No bone lesions. IMPRESSION: Consolidation consistent with pneumonia in the posteromedial left base. More subtle airspace opacity medial right base. Lungs elsewhere clear. Heart size normal. No evident adenopathy. Electronically Signed   By: Lowella Grip III M.D.   On: 07/19/2020 15:47   CT Angio Chest PE W/Cm &/Or Wo Cm  Result Date: 07/19/2020 CLINICAL DATA:  79 year old male with concern for pulmonary embolism. EXAM: CT ANGIOGRAPHY CHEST WITH CONTRAST TECHNIQUE: Multidetector CT imaging of the chest was performed using the standard protocol during bolus administration of intravenous contrast. Multiplanar CT image reconstructions and MIPs were obtained to evaluate the vascular anatomy. CONTRAST:  135mL OMNIPAQUE IOHEXOL 350 MG/ML SOLN COMPARISON:  Chest CT dated 05/16/2020. FINDINGS: Cardiovascular: There is no cardiomegaly. There is trace pericardial effusion. Advanced 3  vessel coronary vascular calcification. There is moderate atherosclerotic calcification of the thoracic aorta. Evaluation of the pulmonary arteries is somewhat limited due to respiratory motion artifact and suboptimal visualization of the peripheral branches. No central pulmonary artery embolus identified. Mediastinum/Nodes: There is no hilar or mediastinal adenopathy. The esophagus is grossly unremarkable. No mediastinal fluid collection. Lungs/Pleura: Small bilateral pleural effusions with partial consolidative changes of the lower lobes, new since the prior CT and most consistent with pneumonia. There is a cluster of nodular density at the right lung base as well as to a lesser degree involving the right middle lobe most consistent with infiltrate. Aspiration is not excluded clinical correlation is recommended. There is no pneumothorax. Mucus secretions noted in the trachea and  bilateral mainstem bronchus. Upper Abdomen: High attenuating content within the gallbladder, likely vicarious excretion of contrast. Layering sludge or stones are not excluded. Musculoskeletal: Osteopenia with degenerative changes of the spine. There is diffuse subcutaneous edema. No acute osseous pathology. Review of the MIP images confirms the above findings. IMPRESSION: 1. No CT evidence of central pulmonary artery embolus. 2. Small bilateral pleural effusions with partial consolidative changes of the lower lobes most consistent with pneumonia. Aspiration is not excluded. Clinical correlation is recommended. 3. Aortic Atherosclerosis (ICD10-I70.0). Electronically Signed   By: Anner Crete M.D.   On: 07/19/2020 18:01        Scheduled Meds: . allopurinol  100 mg Oral BID  . amiodarone  200 mg Oral Daily  . carvedilol  12.5 mg Oral BID WC  . dronabinol  2.5 mg Oral BID AC  . feeding supplement (ENSURE ENLIVE)  237 mL Oral TID BM  . folic acid  1 mg Oral Daily  . guaiFENesin  600 mg Oral BID  . insulin aspart  0-5 Units  Subcutaneous QHS  . insulin aspart  0-9 Units Subcutaneous TID WC  . insulin glargine  6 Units Subcutaneous QHS  . pantoprazole  80 mg Oral Q1200  . polyethylene glycol  17 g Oral Daily  . potassium chloride SA  20 mEq Oral Daily  . rosuvastatin  5 mg Oral Daily  . sacubitril-valsartan  2 tablet Oral BID  . thiamine  100 mg Oral Daily   Continuous Infusions:   LOS: 1 day   Time spent: 17mins Greater than 50% of this time was spent in counseling, explanation of diagnosis, planning of further management, and coordination of care.  I have personally reviewed and interpreted on  07/20/2020 daily labs, imagings as discussed above under date review session and assessment and plans.  I reviewed all nursing notes, pharmacy notes, consultant notes,  vitals, pertinent old records  I have discussed plan of care as described above with RN , patient and family on 07/20/2020  Voice Recognition /Dragon dictation system was used to create this note, attempts have been made to correct errors. Please contact the author with questions and/or clarifications.   Florencia Reasons, MD PhD FACP [facility]  Available via Epic secure chat 7am-7pm for nonurgent issues Please page for urgent issues To page the attending provider between 7A-7P or the covering provider during after hours 7P-7A, please log into the web site [email or web address] and access using universal [place] password for that web site. If you do not have the password, please call the hospital operator.    07/20/2020, 8:23 AM

## 2020-07-20 NOTE — Evaluation (Signed)
Physical Therapy Evaluation Patient Details Name: Tony Marshall MRN: [id number] DOB: December 23, 1940 Today's Date: 07/20/2020   History of Present Illness  79 y.o. male with PMH significant for CLL with recent pancytopenia, CHF with 25 to 30% LVEF, HTN, HLD, type II DM, diverticulosis, who presents after recent admission 06/28/20-07/11/20 (for DKA, hematochezia, PNA, RUE DVT, declined SNF) now re-admitted with cough.  Clinical Impression  Pt admitted with above diagnosis. Pt soiled in very large BM at start of session. After being cleaned up, pt was assisted from bed to recliner with +2 min/mod assist. Pt then stood for ~30 seconds with RW, tolerance limited by fatigue. Vital signs stabile on 2L O2 [place]. Frequent coughing noted. Pt's wife stated she plans to care for pt at home.  Pt currently with functional limitations due to the deficits listed below (see PT Problem List). Pt will benefit from skilled PT to increase their independence and safety with mobility to allow discharge to the venue listed below.       Follow Up Recommendations Home health PT;Supervision for mobility/OOB (pt's wife stated they plan to care for pt at home)    Equipment Recommendations  None recommended by PT    Recommendations for Other Services       Precautions / Restrictions Precautions Precautions: Fall Restrictions Weight Bearing Restrictions: No      Mobility  Bed Mobility               General bed mobility comments: up at edge of bed  Transfers Overall transfer level: Needs assistance Equipment used: 2 person hand held assist;Rolling walker (2 wheeled) Transfers: Sit to/from Stand Sit to Stand: +2 physical assistance;Mod assist Stand pivot transfers: Min assist;+2 safety/equipment       General transfer comment: sit to stand from bed with HHA of 2, pt pivoted to recliner, then sit to stand from recliner with RW; pt stood ~30 seconds with RW, tolerance limited by fatigue, VSS, pt on 2L O2  during activity. Pt likely fatigued from lengthy cleanup process as he was soiled from very large BM at start of session.  Ambulation/Gait                Stairs            Wheelchair Mobility    Modified Rankin (Stroke Patients Only)       Balance Overall balance assessment: Needs assistance Sitting-balance support: Feet supported;Bilateral upper extremity supported Sitting balance-Leahy Scale: Fair     Standing balance support: Bilateral upper extremity supported;During functional activity Standing balance-Leahy Scale: Poor Standing balance comment: reliant on external support with RW                             Pertinent Vitals/Pain Pain Assessment: No/denies pain    Home Living Family/patient expects to be discharged to:: Private residence Living Arrangements: Spouse/significant other Available Help at Discharge: Family Type of Home: House Home Access: Level entry     Home Layout: One level Home Equipment: Environmental consultant - 2 wheels;Bedside commode;Toilet riser;Walker - 4 wheels;Hospital bed Additional Comments: sunk in living room (3 steps to kitchen), pt does not have to go down or up steps to get to living room where his bed is.    Prior Function Level of Independence: Independent   Gait / Transfers Assistance Needed: pt has not required use of RW or any AD for mobility prior to recent hospitalization in June due to [place]  cellulitis and sepsis. Pt and wife report he was walking short distances in the home prior to this admission.     Comments: retired from working with [employer], [employer] (consulting); enjoys fishing/hunting and Oceanographer Dominance   Dominant Hand: Right    Extremity/Trunk Assessment   Upper Extremity Assessment Upper Extremity Assessment: Defer to OT evaluation    Lower Extremity Assessment Lower Extremity Assessment: Generalized weakness    Cervical / Trunk Assessment Cervical / Trunk Assessment: Kyphotic  Communication    Communication: HOH  Cognition Arousal/Alertness: Awake/alert Behavior During Therapy: Flat affect Overall Cognitive Status: Within Functional Limits for tasks assessed Area of Impairment: Awareness                               General Comments: At start of session, pt lying in very large amount of stool but had limited awareness of this      General Comments      Exercises     Assessment/Plan    PT Assessment Patient needs continued PT services  PT Problem List Decreased strength;Decreased mobility;Decreased activity tolerance;Decreased balance;Decreased knowledge of use of DME;Pain;Decreased cognition       PT Treatment Interventions DME instruction;Therapeutic exercise;Gait training;Balance training;Functional mobility training;Therapeutic activities;Patient/family education    PT Goals (Current goals can be found in the Care Plan section)  Acute Rehab PT Goals Patient Stated Goal: Decreased pain and increased endurance PT Goal Formulation: With patient/family Time For Goal Achievement: 08/03/20 Potential to Achieve Goals: Good    Frequency Min 3X/week   Barriers to discharge        Co-evaluation PT/OT/SLP Co-Evaluation/Treatment: Yes Reason for Co-Treatment: For patient/therapist safety;To address functional/ADL transfers PT goals addressed during session: Mobility/safety with mobility;Proper use of DME;Balance         AM-PAC PT "6 Clicks" Mobility  Outcome Measure Help needed turning from your back to your side while in a flat bed without using bedrails?: A Little Help needed moving from lying on your back to sitting on the side of a flat bed without using bedrails?: A Little Help needed moving to and from a bed to a chair (including a wheelchair)?: A Lot Help needed standing up from a chair using your arms (e.g., wheelchair or bedside chair)?: A Lot Help needed to walk in hospital room?: A Lot Help needed climbing 3-5 steps with a railing? :  Total 6 Click Score: 13    End of Session Equipment Utilized During Treatment: Gait belt Activity Tolerance: Patient limited by fatigue Patient left: in chair;with call bell/phone within reach;with family/visitor present Nurse Communication: Mobility status PT Visit Diagnosis: Muscle weakness (generalized) (M62.81);Difficulty in walking, not elsewhere classified (R26.2)    Time: 6659-9357 PT Time Calculation (min) (ACUTE ONLY): 19 min   Charges:   PT Evaluation $PT Eval Moderate Complexity: 1 Mod         Philomena Doheny PT 07/20/2020  Acute Rehabilitation Services Pager [phone] Office [phone]

## 2020-07-20 NOTE — Progress Notes (Signed)
Inpatient Diabetes Program Recommendations  AACE/ADA: New Consensus Statement on Inpatient Glycemic Control (2015)  Target Ranges:  Prepandial:   less than 140 mg/dL      Peak postprandial:   less than 180 mg/dL (1-2 hours)      Critically ill patients:  140 - 180 mg/dL   Lab Results  Component Value Date   GLUCAP 113 (H) 07/20/2020   HGBA1C 7.7 (H) 06/06/2020    Review of Glycemic Control  Diabetes history: DM 2 Outpatient Diabetes medications: Tresiba 12 units Daily, Novolog 3-5 units tid with meals, Actos 15 mg Daily Current orders for Inpatient glycemic control:  Novolog 0-9 units tid + hs  A1c 7.7% on 6/22  Inpatient Diabetes Program Recommendations:    -  Pt has Diagnosis of HF and is on Actos (TZD contraindicated in CHF) would not continue at time of d/c.  -Noted hypoglycemia, home basal insulin has half life of over 40 hours in some pts.  Thanks,  Tama Headings RN, MSN, BC-ADM Inpatient Diabetes Coordinator Team Pager [phone] (8a-5p)

## 2020-07-20 NOTE — Progress Notes (Signed)
Chest vest initiated. Pt did chest vest for 10 minutes. Pt has weak cough. No production.

## 2020-07-21 LAB — GLUCOSE, CAPILLARY
Glucose-Capillary: 102 mg/dL — ABNORMAL HIGH (ref 70–99)
Glucose-Capillary: 239 mg/dL — ABNORMAL HIGH (ref 70–99)
Glucose-Capillary: 298 mg/dL — ABNORMAL HIGH (ref 70–99)

## 2020-07-21 LAB — BASIC METABOLIC PANEL
Anion gap: 9 (ref 5–15)
BUN: 15 mg/dL (ref 8–23)
CO2: 25 mmol/L (ref 22–32)
Calcium: 8 mg/dL — ABNORMAL LOW (ref 8.9–10.3)
Chloride: 98 mmol/L (ref 98–111)
Creatinine, Ser: 0.53 mg/dL — ABNORMAL LOW (ref 0.61–1.24)
GFR calc Af Amer: >60 mL/min (ref >60)
GFR calc non Af Amer: >60 mL/min (ref >60)
Glucose, Bld: 232 mg/dL — ABNORMAL HIGH (ref 70–99)
Potassium: 4 mmol/L (ref 3.5–5.1)
Sodium: 132 mmol/L — ABNORMAL LOW (ref 135–145)

## 2020-07-21 LAB — MAGNESIUM: Magnesium: 1.6 mg/dL — ABNORMAL LOW (ref 1.7–2.4)

## 2020-07-21 LAB — LACTATE DEHYDROGENASE: LDH: 186 U/L (ref 98–192)

## 2020-07-21 LAB — PROCALCITONIN: Procalcitonin: <0.1 ng/mL

## 2020-07-21 LAB — LACTIC ACID, PLASMA: Lactic Acid, Venous: 0.9 mmol/L (ref 0.5–1.9)

## 2020-07-21 MED ORDER — HYDROCOD POLST-CPM POLST ER 10-8 MG/5ML PO SUER: 5.0000 mL | Freq: Two times a day (BID) | ORAL | 0 refills | Status: AC

## 2020-07-21 MED ORDER — GUAIFENESIN ER 600 MG PO TB12: 600.0000 mg | ORAL_TABLET | Freq: Two times a day (BID) | ORAL | 0 refills | Status: AC

## 2020-07-21 MED ORDER — TRAMADOL HCL 50 MG PO TABS: 50.0000 mg | ORAL_TABLET | Freq: Two times a day (BID) | ORAL | 0 refills | Status: AC | PRN

## 2020-07-21 MED ORDER — POTASSIUM CHLORIDE CRYS ER 20 MEQ PO TBCR: 20.0000 meq | EXTENDED_RELEASE_TABLET | Freq: Every day | ORAL | 0 refills | Status: AC

## 2020-07-21 MED ORDER — SENNA 8.6 MG PO TABS: 1.0000 | ORAL_TABLET | Freq: Every evening | ORAL | 0 refills | Status: AC | PRN

## 2020-07-21 MED ORDER — MAGNESIUM SULFATE 2 GM/50ML IV SOLN
2.0000 g | Freq: Once | INTRAVENOUS | Status: AC
  Administered 2020-07-21: 2 g via INTRAVENOUS
  Filled 2020-07-21: qty 50

## 2020-07-21 MED ORDER — POLYETHYLENE GLYCOL 3350 17 G PO PACK: 17.0000 g | PACK | Freq: Every day | ORAL | 0 refills | Status: AC | PRN

## 2020-07-21 MED ORDER — OXYCODONE HCL 5 MG PO TABS: 5.0000 mg | ORAL_TABLET | ORAL | 0 refills | Status: AC | PRN

## 2020-07-21 NOTE — Progress Notes (Signed)
Occupational Therapy Treatment Patient Details Name: Tony Marshall MRN: [id number] DOB: May 31, 1941 Today's Date: 07/21/2020    History of present illness 79 y.o. male with PMH significant for CLL with recent pancytopenia, CHF with 25 to 30% LVEF, HTN, HLD, type II DM, diverticulosis, who presents after recent admission 06/28/20-07/11/20 (for DKA, hematochezia, PNA, RUE DVT, declined SNF) now re-admitted with cough.   OT comments  Pt very agreeable to to OOB and having breakfast.  Pt with flat affect.  Family not present  Follow Up Recommendations  SNF    Equipment Recommendations  Other (comment) (wife wants a wheelchair)    Recommendations for Other Services      Precautions / Restrictions Precautions Precautions: Fall Restrictions Weight Bearing Restrictions: No       Mobility Bed Mobility Overal bed mobility: Needs Assistance Bed Mobility: Supine to Sit Rolling: Min assist            Transfers Overall transfer level: Needs assistance Equipment used: Rolling walker (2 wheeled) Transfers: Sit to/from [facility] Sit to Stand: +2 physical assistance;Max assist Stand pivot transfers: Max assist;+2 physical assistance            Balance Overall balance assessment: History of Falls;Needs assistance Sitting-balance support: Bilateral upper extremity supported Sitting balance-Leahy Scale: Fair     Standing balance support: Bilateral upper extremity supported;During functional activity Standing balance-Leahy Scale: Poor                             ADL either performed or assessed with clinical judgement   ADL Overall ADL's : Needs assistance/impaired Eating/Feeding: Set up;Supervision/ safety;Sitting   Grooming: Sitting;Set up;Wash/dry hands           Upper Body Dressing : Minimal assistance;Sitting Upper Body Dressing Details (indicate cue type and reason): gown Lower Body Dressing: Sit to/from stand;Maximal assistance;+2  for safety/equipment;+2 for physical assistance;Cueing for safety;Cueing for sequencing       Toileting- Clothing Manipulation and Hygiene: Maximal assistance;Sit to/from stand;Cueing for safety;+2 for safety/equipment;+2 for physical assistance               Vision Patient Visual Report: No change from baseline            Cognition Arousal/Alertness: Awake/alert Behavior During Therapy: Flat affect Overall Cognitive Status: Within Functional Limits for tasks assessed                                                     Pertinent Vitals/ Pain       Pain Assessment: No/denies pain  Home Living Family/patient expects to be discharged to:: Private residence Living Arrangements: Spouse/significant other                                          Frequency  Min 2X/week        Progress Toward Goals  OT Goals(current goals can now be found in the care plan section)  Progress towards OT goals: Progressing toward goals     Plan Discharge plan remains appropriate    Co-evaluation                 AM-PAC OT "6 Clicks" Daily Activity  Outcome Measure   Help from another person eating meals?: A Little Help from another person taking care of personal grooming?: A Lot Help from another person toileting, which includes using toliet, bedpan, or urinal?: A Lot (condom cath) Help from another person bathing (including washing, rinsing, drying)?: A Lot Help from another person to put on and taking off regular upper body clothing?: A Little Help from another person to put on and taking off regular lower body clothing?: A Lot 6 Click Score: 14    End of Session Equipment Utilized During Treatment: Rolling walker  OT Visit Diagnosis: Unsteadiness on feet (R26.81);Other abnormalities of gait and mobility (R26.89);Muscle weakness (generalized) (M62.81);Other symptoms and signs involving cognitive function   Activity Tolerance  Patient limited by fatigue;Patient tolerated treatment well   Patient Left with call bell/phone within reach;with family/visitor present;in chair;with chair alarm set   Nurse Communication Mobility status        Time: 1030-1045 OT Time Calculation (min): 15 min  Charges: OT General Charges $OT Visit: 1 Visit OT Treatments $Self Care/Home Management : 8-22 mins  Kari Baars, [place] [phone] Office- [phone]      Melesa Lecy, Edwena Felty D 07/21/2020, 11:09 AM

## 2020-07-21 NOTE — Progress Notes (Signed)
Inpatient Diabetes Program Recommendations  AACE/ADA: New Consensus Statement on Inpatient Glycemic Control (2015)  Target Ranges:  Prepandial:   less than 140 mg/dL      Peak postprandial:   less than 180 mg/dL (1-2 hours)      Critically ill patients:  140 - 180 mg/dL   Lab Results  Component Value Date   GLUCAP 298 (H) 07/21/2020   HGBA1C 7.7 (H) 06/06/2020    Review of Glycemic Control  Diabetes history: DM 2 Outpatient Diabetes medications: Tresiba 12 units Daily, Novolog 3-5 units tid with meals, Actos 15 mg Daily Current orders for Inpatient glycemic control:  Novolog 0-9 units tid + hs  A1c 7.7% on 6/22  Inpatient Diabetes Program Recommendations:    -Consider adding Levemir 8 units today (glucose consistently >200)  -  Pt has Diagnosis of HF and is on Actos (TZD contraindicated in CHF) would not continue at time of d/c.  Thanks,  Tama Headings RN, MSN, BC-ADM Inpatient Diabetes Coordinator Team Pager [phone] (8a-5p)

## 2020-07-21 NOTE — Discharge Summary (Signed)
Discharge Summary  Tony Marshall PXT:[id number] DOB: Aug 06, 1941  PCP: Timoteo Gaul, FNP  Admit date: 07/19/2020 Discharge date: 07/21/2020  Time spent: 5mns, more than 50% time spent on coordination of care.   Recommendations for Outpatient Follow-up:  1. F/u with PCP within a week  for hospital discharge follow up, repeat cbc/bmp at follow up 2. F/u with oncology Dr. MJulien Nordmann3. Patient expressed wishes to go home , declined nursing home placement , declined home hospice ,continue home health, community palliative care  Discharge Diagnoses:  Active Hospital Problems   Diagnosis Date Noted  . Advanced care planning/counseling discussion   . Palliative care by specialist   . Cough   . DNR (do not resuscitate)   . Physical deconditioning 07/19/2020  . Atrial fibrillation ([facility]   . Acute combined systolic and diastolic heart failure ([facility]   . Failure to thrive in adult   . CLL (chronic lymphocytic leukemia) ([facility] 10/25/2013  . Essential hypertension 07/21/2013  . Diabetes mellitus ([facility] 07/20/2013    Resolved Hospital Problems  No resolved problems to display.    Discharge Condition: stable  Diet recommendation: heart healthy/carb modified  Filed Weights   07/19/20 2233  Weight: 52.6 kg    History of present illness:  Referring physician: GKrista Blue PA-C PCP: KElayne Snare MD   Chief Complaint: cough  HPI: Tony TRULSONis a 79y.o. male with PMH significant for CLL with recent pancytopenia, CHF with 25 to 30% LVEF, HTN, HLD, type II DM, diverticulosis, who presents after recent admission with cough.  Recently complex admission from 06/28/2020 - 07/11/2020. Presented to ED w hematochezia and found to be in DKA. Rectal bleeding 2/2 fecal impaction, tx'd conservatively. Also found to have new Afib w RVR requiring amiodarone drip>PO. Also w new RUE DVT. Not started on anticoagulation due to high GI bleeding risk. Finally also developed LLL PNA w Klebsiella  growing on sputum culture. Recommended for discharge to SNF but declined and d/c to home.   Today interviewed with wife at bedside They report that he developed a cough while he was in the hospital and first diagnosed with pneumonia The cough is not changed significantly since they were discharged it is simply not gone away The cough has been productive of sputum they have noticed only a slight increase in the yellow color of the sputum since discharge but otherwise no changes Denies fevers They were seen in PCPs office earlier today and recommended to come to the ED due these symptoms They report that they did not get discharged to a SNF at the end of last hospitalization because they did not like the options they had available Their plan had been to do home PT with wife and daughter providing round-the-clock care  In the ED presents w normal VS, CBC and CMP without significant change from last admission, BNP 250 but no prior baseline in computer and troponin normal, unremarkable ECG. CTPA showed no evidence of PE, small bilateral pleural effusions with possible aspiration PNA bilaterally.  Admitted for physical deconditioning and chest PT.     Hospital Course:  Active Problems:   Diabetes mellitus ([facility]   Essential hypertension   CLL (chronic lymphocytic leukemia) (HCC)   Failure to thrive in adult   Acute combined systolic and diastolic heart failure (HCC)   Atrial fibrillation ([facility]   Physical deconditioning   Advanced care planning/counseling discussion   Palliative care by specialist   Cough   DNR (do not resuscitate)  Cough  -Chest x-ray showed "Consolidation consistent with pneumonia in the posteromedial left base. More subtle airspace opacity medial right base. Lungs elsewhere clear. Heart size normal. No evident adenopathy." -There is no fever, does not appear septic, no hypoxia - procalcitonin less than 0.1,  he did well with speech therapist,  -Continue Mucinex,   flutter valve, as needed antitussive, continue PPI  (report has bad acid reflux)  Chronic systolic CHF, diagnosed in June 2021(EF 25 to 30%) -Continue Coreg and Entresto, blood pressure and renal function stable Currently euvolemic  Paroxysmal A. Fib Continue Coreg and amiodarone, not on anticoagulation Currently in sinus rhythm  Recent history of right upper extremity DVT, not started on anticoagulation due to high GI bleeding risk and pancytopenia  Recent h/o GI bleed a few weeks ago S/p flexible sigmoidoscopy on July 21 "Non-bleeding external hemorrhoids found on perianal exam A single (solitary) ulcer in the rectum consistents with stercoral ulcer - the source of recent Bleeding." -patient had a large brown stool with a rim of blood around it on the pad x1 on 8/5 -Vital signs are stable, h/h stable  Insulin-dependent type 2 diabetes, uncontrolled with hypoglycemia episode  and hyperglycemia episode Continue home insulin regimen  CLL with pancytopenia, not able to tolerate chemotherapy Was admitted to the hospital with septic shock , DKA, left lower lobe pneumonia, GI bleed, right upper extremity DVT after chemotherapy   FTT/progressive weakness /ongoing weight loss /severe malnutrition in context of chronic illness, Underweight Body mass index is 18.16 kg/m.  Overall poor prognosis, patient agreed to talk to palliative care.  DVT prophylaxis: SCDs Start: 07/19/20 1950  Code Status: DNR Family Communication:  wife and daughter at bedside Disposition:   Status is: Inpatient  Dispo: Home with home health     Consultants:   Palliative care  Procedures:   None  Antimicrobials:   None   Discharge Exam: BP (!) 164/99 (BP Location: Right Arm)   Pulse 84   Temp 97.6 F (36.4 C) (Oral)   Resp 18   Ht 5' 7"  (1.702 m)   Wt 52.6 kg   SpO2 99%   BMI 18.16 kg/m   General: weak  And Frail, NAD,  AAOx3 Cardiovascular: RRR Respiratory: Normal respiratory effort  Discharge Instructions You were cared for by a hospitalist during your hospital stay. If you have any questions about your discharge medications or the care you received while you were in the hospital after you are discharged, you can call the unit and asked to speak with the hospitalist on call if the hospitalist that took care of you is not available. Once you are discharged, your primary care physician will handle any further medical issues. Please note that NO REFILLS for any discharge medications will be authorized once you are discharged, as it is imperative that you return to your primary care physician (or establish a relationship with a primary care physician if you do not have one) for your aftercare needs so that they can reassess your need for medications and monitor your lab values.  Discharge Instructions    Diet - low sodium heart healthy   Complete by: As directed    Carb  modified diet   Increase activity slowly   Complete by: As directed      Allergies as of 07/21/2020      Reactions   Glycopyrrolate Rash      Medication List    STOP taking these medications   guaiFENesin-dextromethorphan 100-10 MG/5ML syrup Commonly  known as: ROBITUSSIN DM     TAKE these medications   acetaminophen 500 MG tablet Commonly known as: TYLENOL Take 500 mg by mouth every 6 (six) hours as needed for moderate pain.   allopurinol 100 MG tablet Commonly known as: Zyloprim Take 1 tablet (100 mg total) by mouth 2 (two) times daily.   ALPRAZolam 0.25 MG tablet Commonly known as: XANAX Take 1 tablet (0.25 mg total) by mouth at bedtime as needed for anxiety.   amiodarone 200 MG tablet Commonly known as: PACERONE Take 1 tablet (200 mg total) by mouth daily.   carvedilol 12.5 MG tablet Commonly known as: COREG Take 1 tablet (12.5 mg total) by mouth 2 (two) times daily with a meal.   chlorpheniramine-HYDROcodone 10-8  MG/5ML Suer Commonly known as: TUSSIONEX Take 5 mLs by mouth every 12 (twelve) hours.   dronabinol 2.5 MG capsule Commonly known as: MARINOL TAKE 1 CAPSULE BY MOUTH TWICE DAILY BEFORE A MEAL What changed: See the new instructions.   esomeprazole 40 MG capsule Commonly known as: NEXIUM TAKE 1 CAPSULE BY MOUTH EVERY MORNING 30 MINUTES BEFORE BREAKFAST What changed: See the new instructions.   feeding supplement (ENSURE ENLIVE) Liqd Take 237 mLs by mouth 3 (three) times daily between meals.   folic acid 1 MG tablet Commonly known as: FOLVITE Take 1 tablet (1 mg total) by mouth daily.   Glucagon Emergency 1 MG Kit Inject 1 mg into the skin once as needed (For severe low blood sugar).   guaiFENesin 600 MG 12 hr tablet Commonly known as: MUCINEX Take 1 tablet (600 mg total) by mouth 2 (two) times daily.   NovoLOG FlexPen 100 UNIT/ML FlexPen Generic drug: insulin aspart Inject 3-5 Units into the skin 3 (three) times daily with meals. Depending on the blood sugar level.   OneTouch Verio test strip Generic drug: glucose blood USE 1 STRIP TO CHECK GLUCOSE THREE TIMES DAILY   oxyCODONE 5 MG immediate release tablet Commonly known as: Oxy IR/ROXICODONE Take 1 tablet (5 mg total) by mouth every 4 (four) hours as needed for severe pain.   oxymetazoline 0.05 % nasal spray Commonly known as: AFRIN Place 1 spray into both nostrils 2 (two) times daily as needed for congestion.   pioglitazone 15 MG tablet Commonly known as: ACTOS Take 15 mg by mouth daily.   polyethylene glycol 17 g packet Commonly known as: MIRALAX / GLYCOLAX Take 17 g by mouth daily as needed for severe constipation. hold if having multiple loose stools per day What changed:   when to take this  reasons to take this   potassium chloride SA 20 MEQ tablet Commonly known as: KLOR-CON Take 1 tablet (20 mEq total) by mouth daily.   rosuvastatin 5 MG tablet Commonly known as: CRESTOR TAKE 1 TABLET BY MOUTH  EVERY DAY   sacubitril-valsartan 24-26 MG Commonly known as: ENTRESTO Take 2 tablets by mouth 2 (two) times daily.   senna 8.6 MG Tabs tablet Commonly known as: SENOKOT Take 1 tablet (8.6 mg total) by mouth at bedtime as needed for moderate constipation. What changed:   when to take this  reasons to take this   thiamine 100 MG tablet Take 1 tablet (100 mg total) by mouth daily.   traMADol 50 MG tablet Commonly known as: ULTRAM Take 1 tablet (50 mg total) by mouth every 12 (twelve) hours as needed. What changed: reasons to take this   Tresiba FlexTouch 100 UNIT/ML FlexTouch Pen Generic drug: insulin degludec INJECT 12  UNITS DAILY. IF FASTING BLOOD SUGARS ARE OVER 140 INCREASE DOSAGE UP TO 15 UNITS DAILY AS DIRECTED.      Allergies  Allergen Reactions  . Glycopyrrolate Rash    Follow-up Information    Elayne Snare, MD .   Specialty: Endocrinology Contact information: [address] [place] [address] [phone]        Skeet Latch, MD .   Specialty: Cardiology Contact information: [address] [place] [address] [phone]        Timoteo Gaul, FNP Follow up in 1 week(s).   Specialty: Family Medicine Why: Hospital discharge follow-up, repeat CBC BMP at follow-up Contact information: [place] [address] [phone]        Curt Bears, MD Follow up.   Specialty: Oncology Contact information: [place] [address] [phone]                The results of significant diagnostics from this hospitalization (including imaging, microbiology, ancillary and laboratory) are listed below for reference.    Significant Diagnostic Studies: DG Chest 2 View  Result Date: 07/19/2020 CLINICAL DATA:  Cough and shortness of breath EXAM: CHEST - 2 VIEW COMPARISON:  July 09, 2020 FINDINGS: There is airspace opacity with a degree of consolidation in the posteromedial  left base. There is more subtle opacity in the medial right base. Lungs elsewhere are clear. Heart size and pulmonary vascularity are normal. No adenopathy. There is a skin fold on the left. No pneumothorax. No bone lesions. IMPRESSION: Consolidation consistent with pneumonia in the posteromedial left base. More subtle airspace opacity medial right base. Lungs elsewhere clear. Heart size normal. No evident adenopathy. Electronically Signed   By: Lowella Grip III M.D.   On: 07/19/2020 15:47   DG Chest 2 View  Result Date: 07/09/2020 CLINICAL DATA:  Pneumonia. EXAM: CHEST - 2 VIEW COMPARISON:  07/08/2020 FINDINGS: Lungs are somewhat hypoinflated demonstrate hazy opacification over the medial right base and left retrocardiac region likely representing layering small to moderate effusions as seen on the lateral film with associated basilar atelectasis. Small nodule opacity over the right mid lung not seen previously. Cardiomediastinal silhouette and remainder of the exam is unchanged. IMPRESSION: Small to moderate bilateral layering pleural effusions likely with associated bibasilar atelectasis. 2. Small nodular density projects over the right midlung. Recommend follow-up PA chest radiograph. If this persists, then CT chest is recommended on elective basis. Electronically Signed   By: Marin Olp M.D.   On: 07/09/2020 14:40   DG Lumbar Spine 2-3 Views  Result Date: 06/27/2020 CLINICAL DATA:  Low back pain.  No known injury. EXAM: LUMBAR SPINE - 2-3 VIEW COMPARISON:  CT chest, abdomen and pelvis 06/07/2020. FINDINGS: Vertebral body height and alignment are maintained. Intervertebral disc space height is normal. Paraspinous structures demonstrate atherosclerosis. IMPRESSION: Normal appearing lumbar spine. Aortic Atherosclerosis (ICD10-I70.0). Electronically Signed   By: Inge Rise M.D.   On: 06/27/2020 11:01   CT Angio Chest PE W/Cm &/Or Wo Cm  Result Date: 07/19/2020 CLINICAL DATA:  79 year old  male with concern for pulmonary embolism. EXAM: CT ANGIOGRAPHY CHEST WITH CONTRAST TECHNIQUE: Multidetector CT imaging of the chest was performed using the standard protocol during bolus administration of intravenous contrast. Multiplanar CT image reconstructions and MIPs were obtained to evaluate the vascular anatomy. CONTRAST:  19m OMNIPAQUE IOHEXOL 350 MG/ML SOLN COMPARISON:  Chest CT dated 05/16/2020. FINDINGS: Cardiovascular: There is no cardiomegaly. There is trace  pericardial effusion. Advanced 3 vessel coronary vascular calcification. There is moderate atherosclerotic calcification of the thoracic aorta. Evaluation of the pulmonary arteries is somewhat limited due to respiratory motion artifact and suboptimal visualization of the peripheral branches. No central pulmonary artery embolus identified. Mediastinum/Nodes: There is no hilar or mediastinal adenopathy. The esophagus is grossly unremarkable. No mediastinal fluid collection. Lungs/Pleura: Small bilateral pleural effusions with partial consolidative changes of the lower lobes, new since the prior CT and most consistent with pneumonia. There is a cluster of nodular density at the right lung base as well as to a lesser degree involving the right middle lobe most consistent with infiltrate. Aspiration is not excluded clinical correlation is recommended. There is no pneumothorax. Mucus secretions noted in the trachea and bilateral mainstem bronchus. Upper Abdomen: High attenuating content within the gallbladder, likely vicarious excretion of contrast. Layering sludge or stones are not excluded. Musculoskeletal: Osteopenia with degenerative changes of the spine. There is diffuse subcutaneous edema. No acute osseous pathology. Review of the MIP images confirms the above findings. IMPRESSION: 1. No CT evidence of central pulmonary artery embolus. 2. Small bilateral pleural effusions with partial consolidative changes of the lower lobes most consistent with  pneumonia. Aspiration is not excluded. Clinical correlation is recommended. 3. Aortic Atherosclerosis (ICD10-I70.0). Electronically Signed   By: Anner Crete M.D.   On: 07/19/2020 18:01   DG CHEST PORT 1 VIEW  Result Date: 07/08/2020 CLINICAL DATA:  Progressive coughing. EXAM: PORTABLE CHEST 1 VIEW COMPARISON:  07/01/2020 FINDINGS: Normal heart size. Aortic atherosclerotic calcifications noted. No pleural effusion or edema identified. There is a retrocardiac opacity within the medial left lung base which may represent atelectasis or airspace disease. Right lung appears clear. IMPRESSION: Retrocardiac opacity within the left lung base which may represent atelectasis or airspace disease. Electronically Signed   By: Kerby Moors M.D.   On: 07/08/2020 05:27   DG CHEST PORT 1 VIEW  Result Date: 07/01/2020 CLINICAL DATA:  79 year old male with acute respiratory failure EXAM: PORTABLE CHEST 1 VIEW COMPARISON:  06/30/2020 FINDINGS: Cardiomediastinal silhouette unchanged in size and contour. Similar appearance of architectural distortion of the right greater than left lungs. No pneumothorax. No large pleural effusion. Coarsened interstitial markings persist with no new confluent airspace disease. Interval slight withdrawal of the left IJ central venous catheter with the tip appearing to terminate superior vena cava. IMPRESSION: Similar appearance of the lungs, with chronic lung changes, scarring/atelectasis and no new confluent airspace disease. Interval slight withdrawal of left IJ central venous catheter. Electronically Signed   By: Corrie Mckusick D.O.   On: 07/01/2020 08:54   DG Chest Port 1 View  Result Date: 06/30/2020 CLINICAL DATA:  Central line placement. EXAM: PORTABLE CHEST 1 VIEW COMPARISON:  June 10, 2020. FINDINGS: The heart size and mediastinal contours are within normal limits. No pneumothorax is noted. Left lung is clear. Mild right basilar subsegmental atelectasis is noted. Interval  placement of left internal jugular catheter with distal tip in expected position of right atrium. The visualized skeletal structures are unremarkable. IMPRESSION: Interval placement of left internal jugular catheter with distal tip in expected position of right atrium. Withdrawal by 3-4 cm is recommended. Mild right basilar subsegmental atelectasis is noted. Electronically Signed   By: Marijo Conception M.D.   On: 06/30/2020 16:10   DG Abd 2 Views  Result Date: 07/07/2020 CLINICAL DATA:  Fecal impaction EXAM: ABDOMEN - 2 VIEW COMPARISON:  None FINDINGS: Normal retained stool burden. Specifically no abnormal retained stool burden  seen within the rectum. Scattered gas throughout colon. Small bowel gas pattern normal.  Bones demineralized. IMPRESSION: Normal bowel gas pattern. No abnormal retained stool burden. Electronically Signed   By: Lavonia Dana M.D.   On: 07/07/2020 12:51   DG Abd Portable 1V  Result Date: 07/03/2020 CLINICAL DATA:  79 y.o. male with constipation. Medical history significant of CLL, DM, HTN, HLD, combined systolic/diastolic CHF EXAM: PORTABLE ABDOMEN - 1 VIEW COMPARISON:  Abdominal radiograph 07/01/2020 FINDINGS: The far left hemiabdomen is excluded from field of view. The bowel gas pattern is nonobstructive. Moderate stool burden. No supine evidence for free air. No unexpected radiopaque calcification. No acute finding in the visualized skeleton. IMPRESSION: Nonobstructive bowel gas pattern. Moderate stool burden. Electronically Signed   By: Audie Pinto M.D.   On: 07/03/2020 08:34   DG Abd Portable 1V  Result Date: 07/01/2020 CLINICAL DATA:  Fecal impaction. EXAM: PORTABLE ABDOMEN - 1 VIEW COMPARISON:  February 02, 2018. FINDINGS: No abnormal bowel dilatation is noted. Mild amount of stool is noted in the rectum. No radio-opaque calculi or other significant radiographic abnormality are seen. IMPRESSION: Mild stool burden is noted. No evidence of bowel obstruction or ileus.  Electronically Signed   By: Marijo Conception M.D.   On: 07/01/2020 15:34   VAS [place] LOWER EXTREMITY VENOUS (DVT) (ONLY [facility] & [facility] 7a-7p)  Result Date: 06/28/2020  Lower Venous DVTStudy Indications: Edema.  Comparison       Piror studies RT LEV 06-11-2020 and 06-07-2020. Negative for Study:           DVT. Performing Technologist: Darlin Coco  Examination Guidelines: A complete evaluation includes B-mode imaging, spectral Doppler, color Doppler, and power Doppler as needed of all accessible portions of each vessel. Bilateral testing is considered an integral part of a complete examination. Limited examinations for reoccurring indications may be performed as noted. The reflux portion of the exam is performed with the patient in reverse Trendelenburg.  +---------+---------------+---------+-----------+----------+--------------+ RIGHT    CompressibilityPhasicitySpontaneityPropertiesThrombus Aging +---------+---------------+---------+-----------+----------+--------------+ CFV      Full           Yes      Yes                                 +---------+---------------+---------+-----------+----------+--------------+ SFJ      Full                                                        +---------+---------------+---------+-----------+----------+--------------+ FV Prox  Full                                                        +---------+---------------+---------+-----------+----------+--------------+ FV Mid   Full                                                        +---------+---------------+---------+-----------+----------+--------------+ FV DistalFull                                                        +---------+---------------+---------+-----------+----------+--------------+  PFV      Full                                                        +---------+---------------+---------+-----------+----------+--------------+ POP      Full           Yes      Yes                                  +---------+---------------+---------+-----------+----------+--------------+ PTV      Full                                                        +---------+---------------+---------+-----------+----------+--------------+ PERO     Full                                                        +---------+---------------+---------+-----------+----------+--------------+   +----+---------------+---------+-----------+----------+--------------+ LEFTCompressibilityPhasicitySpontaneityPropertiesThrombus Aging +----+---------------+---------+-----------+----------+--------------+ CFV Full           Yes      Yes                                 +----+---------------+---------+-----------+----------+--------------+     Summary: RIGHT: - There is no evidence of deep vein thrombosis in the lower extremity.  - No cystic structure found in the popliteal fossa.  LEFT: - No evidence of common femoral vein obstruction.  *See table(s) above for measurements and observations. Electronically signed by Curt Jews MD on 06/28/2020 at 7:40:49 PM.    Final    VAS [place] UPPER EXTREMITY VENOUS DUPLEX  Result Date: 07/04/2020 UPPER VENOUS STUDY  Indications: Edema Risk Factors: None identified. Comparison Study: No prior studies. Performing Technologist: Oliver Hum RVT  Examination Guidelines: A complete evaluation includes B-mode imaging, spectral Doppler, color Doppler, and power Doppler as needed of all accessible portions of each vessel. Bilateral testing is considered an integral part of a complete examination. Limited examinations for reoccurring indications may be performed as noted.  Right Findings: +----------+------------+---------+-----------+----------+-----------------+ RIGHT     CompressiblePhasicitySpontaneousProperties     Summary      +----------+------------+---------+-----------+----------+-----------------+ IJV           Full       Yes       Yes                                 +----------+------------+---------+-----------+----------+-----------------+ Subclavian  Partial      No        No               Age Indeterminate +----------+------------+---------+-----------+----------+-----------------+ Axillary      Full       Yes       Yes                                +----------+------------+---------+-----------+----------+-----------------+  Brachial      Full       Yes       Yes                                +----------+------------+---------+-----------+----------+-----------------+ Radial        Full                                                    +----------+------------+---------+-----------+----------+-----------------+ Ulnar         Full                                                    +----------+------------+---------+-----------+----------+-----------------+ Cephalic      Full                                                    +----------+------------+---------+-----------+----------+-----------------+ Basilic       Full                                                    +----------+------------+---------+-----------+----------+-----------------+  Left Findings: +----------+------------+---------+-----------+----------+-------+ LEFT      CompressiblePhasicitySpontaneousPropertiesSummary +----------+------------+---------+-----------+----------+-------+ Subclavian    Full       Yes       Yes                      +----------+------------+---------+-----------+----------+-------+  Summary:  Right: No evidence of superficial vein thrombosis in the upper extremity. 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  Left: No evidence of thrombosis in the subclavian.  *See table(s) above for measurements and observations.  Diagnosing physician: Servando Snare MD Electronically signed by Servando Snare MD on 07/04/2020 at 5:29:09 PM.    Final      Microbiology: Recent Results (from the past 240 hour(s))  SARS Coronavirus 2 by RT PCR (hospital order, performed in [facility] hospital lab) Nasopharyngeal Nasopharyngeal Swab     Status: None   Collection Time: 07/19/20  3:30 PM   Specimen: Nasopharyngeal Swab  Result Value Ref Range Status   SARS Coronavirus 2 NEGATIVE NEGATIVE Final    Comment: (NOTE) SARS-CoV-2 target nucleic acids are NOT DETECTED.  The SARS-CoV-2 RNA is generally detectable in upper and lower respiratory specimens during the acute phase of infection. The lowest concentration of SARS-CoV-2 viral copies this assay can detect is 250 copies / mL. A negative result does not preclude SARS-CoV-2 infection and should not be used as the sole basis for treatment or other patient management decisions.  A negative result may occur with improper specimen collection / handling, submission of specimen other than nasopharyngeal swab, presence of viral mutation(s) within the areas targeted by this assay, and inadequate number of viral copies (<250 copies / mL). A negative result must be combined with clinical observations, patient history, and  epidemiological information.  Fact Sheet for Patients:   StrictlyIdeas.no  Fact Sheet for Healthcare Providers: BankingDealers.co.za  This test is not yet approved or  cleared by the [place] FDA and has been authorized for detection and/or diagnosis of SARS-CoV-2 by FDA under an Emergency Use Authorization (EUA).  This EUA will remain in effect (meaning this test can be used) for the duration of the COVID-19 declaration under Section 564(b)(1) of the Act, 21 U.S.C. section 360bbb-3(b)(1), unless the authorization is terminated or revoked sooner.  Performed at [facility], [place] [address] [place], [place] [address]   Urine culture     Status: Abnormal   Collection Time: 07/19/20  6:54 PM   Specimen:  Urine, Random  Result Value Ref Range Status   Specimen Description   Final    URINE, RANDOM Performed at [place] [address], [place], [place] [address]    Special Requests   Final    NONE Performed at [facility], [place] [address], [place], [place] [address]    Culture (A)  Final    <10,000 COLONIES/mL INSIGNIFICANT GROWTH Performed at [place] [address] [place],  [address]    Report Status 07/20/2020 FINAL  Final     Labs: Basic Metabolic Panel: Recent Labs  Lab 07/19/20 1512 07/20/20 0512 07/21/20 0442  NA 133* 132* 132*  K 5.1 4.0 4.0  CL 99 98 98  CO2 27 25 25   GLUCOSE 134* 85 232*  BUN 13 13 15   CREATININE 0.67 0.57* 0.53*  CALCIUM 8.0* 8.1* 8.0*  MG  --   --  1.6*   Liver Function Tests: Recent Labs  Lab 07/19/20 1512 07/20/20 0512  AST 26 15  ALT 8 10  ALKPHOS 69 61  BILITOT 1.3* 0.7  PROT 5.0* 4.4*  ALBUMIN 2.3* 2.0*   No results for input(s): LIPASE, AMYLASE in the last 168 hours. No results for input(s): AMMONIA in the last 168 hours. CBC: Recent Labs  Lab 07/19/20 1512 07/20/20 0512 07/20/20 0922  WBC 2.8* 2.8* 3.8*  NEUTROABS 1.7  --  2.3  HGB 8.7* 7.8* 8.7*  HCT 26.8* 24.3* 27.0*  MCV 92.7 94.6 93.8  PLT 157 152 224   Cardiac Enzymes: No results for input(s): CKTOTAL, CKMB, CKMBINDEX, TROPONINI in the last 168 hours. BNP: BNP (last 3 results) Recent Labs    07/19/20 1512  BNP 250.2*    ProBNP (last 3 results) No results for input(s): PROBNP in the last 8760 hours.  CBG: Recent Labs  Lab 07/20/20 1128 07/20/20 1639 07/20/20 2220 07/21/20 0715 07/21/20 1147  GLUCAP 265* 129* 102* 239* 298*       Signed:  Florencia Reasons MD, PhD, FACP  [facility] 07/21/2020, 2:34 PM

## 2020-07-21 NOTE — Progress Notes (Signed)
Patient pivoted from the chair to the bed and oxygen saturation remained above 96%.

## 2020-07-21 NOTE — Progress Notes (Signed)
Hydrologist [facility])  Hospital Liaison: RN note         Notified by [facility] manager of patient/family request for [facility] Palliative services at home after discharge.         [facility]iative team will follow up with patient after discharge.         Please call with any hospice or palliative related questions.         Thank you for this referral.         Domenic Moras, BSN, RN [place] (listed on [place] under Hospice/[facility])    [phone]

## 2020-07-21 NOTE — TOC Initial Note (Signed)
Transition of Care [facility]) - Initial/Assessment Note    Patient Details  Name: Tony Marshall MRN: [id number] Date of Birth: 1941-05-19  Transition of Care [facility]) CM/SW Contact:    Lennart Pall, LCSW Phone Number: 07/21/2020, 10:02 AM  Clinical Narrative:   Met briefly yesterday afternoon with pt, wife and daughter.  They had just completed a meeting with Palliative Care liaison and were clearly tearful and reported they were "trying to take it all in."  Agreed to follow up with them again this morning.  Have spoken with pt who remains very quiet and difficult to engage overall. He is aware that team working to get him home but need to confirm plans (I.e.Hospice referral?).  Reached wife and daughter at home and discussed their conversation with PC yesterday and need to confirm if they are in agreement with Hospice referral.  Wife reports that they are now thinking they "aren't quite ready for that yet... I think we can manage him at home like we were before. He's not that much different."  Attempted to explain more about what Hospice involvement would look like, however, wife would like to speak with Palliative Care again today to discuss this further.  Have alerted Palliative that wife and daughter will be here by noon and would like to speak with them again.  Will hold on any Hospice referral at this time.                Expected Discharge Plan: [place] (vs home with home health) Barriers to Discharge: Continued Medical Work up   Patient Goals and CMS Choice Patient states their goals for this hospitalization and ongoing recovery are:: to return home CMS Medicare.gov Compare Post Acute Care list provided to:: Patient Choice offered to / list presented to : Patient, Spouse, Adult Children  Expected Discharge Plan and Services Expected Discharge Plan: [place] (vs home with home health) In-house Referral: Clinical Social Work     Living arrangements for the past 2  months: [place]                                      Prior Living Arrangements/Services Living arrangements for the past 2 months: Single Family Home Lives with:: Spouse Patient language and need for interpreter reviewed:: Yes Do you feel safe going back to the place where you live?: Yes      Need for Family Participation in Patient Care: Yes (Comment) Care giver support system in place?: Yes (comment) Current home services: Home PT, Home RN, Home OT Criminal Activity/Legal Involvement Pertinent to Current Situation/Hospitalization: No - Comment as needed  Activities of Daily Living Home Assistive Devices/Equipment: CBG Meter, Walker (specify type) (front wheeled walker) ADL Screening (condition at time of admission) Patient's cognitive ability adequate to safely complete daily activities?: Yes Is the patient deaf or have difficulty hearing?: No Does the patient have difficulty seeing, even when wearing glasses/contacts?: No Does the patient have difficulty concentrating, remembering, or making decisions?: Yes (has had periods of altered mental status) Patient able to express need for assistance with ADLs?: Yes Does the patient have difficulty dressing or bathing?: Yes Independently performs ADLs?: No Communication: Independent Dressing (OT): Needs assistance Is this a change from baseline?: Pre-admission baseline Grooming: Needs assistance Is this a change from baseline?: Pre-admission baseline Feeding: Needs assistance Is this a change from baseline?: Pre-admission baseline Bathing:  Needs assistance Is this a change from baseline?: Pre-admission baseline Toileting: Needs assistance Is this a change from baseline?: Pre-admission baseline In/Out Bed: Needs assistance Is this a change from baseline?: Pre-admission baseline Walks in Home: Needs assistance Is this a change from baseline?: Pre-admission baseline Does the patient have difficulty walking or  climbing stairs?: Yes (secondary to weakness) Weakness of Legs: Both Weakness of Arms/Hands: Both  Permission Sought/Granted Permission sought to share information with : Family Supports                Emotional Assessment Appearance:: Appears stated age Attitude/Demeanor/Rapport: Gracious Affect (typically observed): Accepting, Quiet Orientation: : Oriented to Self, Oriented to Place, Oriented to  Time, Oriented to Situation Alcohol / Substance Use: Not Applicable Psych Involvement: No (comment)  Admission diagnosis:  Physical deconditioning [R53.81] Hospital-acquired bacterial pneumonia [J15.9] Patient Active Problem List   Diagnosis Date Noted  . Advanced care planning/counseling discussion   . Palliative care by specialist   . Cough   . DNR (do not resuscitate)   . Physical deconditioning 07/19/2020  . Stercoral ulcer of rectum   . Prolonged QT interval 07/03/2020  . Atrial fibrillation ([place])   . Shock circulatory ([place]) 07/01/2020  . Lower GI bleed   . Central venous catheter in place   . Fecal impaction ([place])   . Overflow diarrhea   . Rectal bleeding   . DKA (diabetic ketoacidoses) ([place]) 06/28/2020  . Systolic and diastolic CHF, chronic ([place]) 06/28/2020  . Anemia associated with acute blood loss   . Dehydration 06/27/2020  . Protein-calorie malnutrition, severe ([place]) 06/09/2020  . Hyperglycemia due to diabetes mellitus ([place])   . Leukopenia   . Acute combined systolic and diastolic heart failure ([place])   . Cardiogenic shock ([place])   . Altered mental status   . Failure to thrive in adult   . Symptomatic anemia   . Acute respiratory failure ([place])   . Increased anion gap metabolic acidosis 27/51/7001  . Goals of care, counseling/discussion 03/28/2020  . Encounter for antineoplastic chemotherapy 03/28/2020  . GERD (gastroesophageal reflux disease) 05/13/2014  . History of colonic polyps 05/13/2014  . Hiatal hernia 05/13/2014  . CLL (chronic lymphocytic leukemia)  ([place]) 10/25/2013  . ED (erectile dysfunction) 10/20/2013  . Lymphocytosis 09/23/2013  . Essential hypertension 07/21/2013  . Pure hypercholesterolemia 07/21/2013  . Diabetes mellitus ([place]) 07/20/2013   PCP:  Elayne Snare, MD Pharmacy:   [id number] [id number]: [phone]: [address]- [address]Phone: [phone]: [phone]    Social Determinants of Health ([place] Interventions    Readmission Risk Interventions Readmission Risk Prevention Plan 07/21/2020  Transportation Screening Complete  Medication Review ([place] Complete  PCP or Specialist appointment within 3-5 days of discharge Complete  HRI or [place]estoneComplete  SW Recovery Care/Counseling Consult Complete  Palliative Care Screening Complete  SWallerPatient Refused  Some recent data might be hidden

## 2020-07-21 NOTE — TOC Transition Note (Addendum)
Transition of Care [facility]) - CM/SW Discharge Note   Patient Details  Name: Tony Marshall MRN: [id number] Date of Birth: 04-20-1941  [facility] of [facility]) CM/SW Contact:  Lennart Pall, LCSW Phone Number: 07/21/2020, 2:06 PM   Clinical Narrative:    Pt and family have decided they want to d/c home and resume prior [facility] services (see below).  They do not want Hospice referral at this time.  No further TOC needs.   Final next level of care: [place] Barriers to Discharge: Barriers Resolved   Patient Goals and CMS Choice Patient states their goals for this hospitalization and ongoing recovery are:: to return home CMS Medicare.gov Compare Post Acute Care list provided to:: Patient Choice offered to / list presented to : Patient, Spouse, Adult Children  Discharge Placement                       Discharge Plan and Services In-house Referral: Clinical Social Work              DME Arranged: NA DME Agency: NA    HH Arranged: RN, PT, OT, Nurse's Aide, Social Work [employer] Agency: [facility] [place] and referral placed with [facility] for palliative care at home (Chrislyn Robbins) Date [place]: 07/21/20 Time [place]: 2707    Social Determinants of Health (SDOH) Interventions     Readmission Risk Interventions Readmission Risk Prevention Plan 07/21/2020  Transportation Screening Complete  Medication Review Press photographer) Complete  PCP or Specialist appointment within 3-5 days of discharge Complete  HRI or [place] Complete  SW Recovery Care/Counseling Consult Complete  [place] Patient Refused  Some recent data might be hidden

## 2020-07-21 NOTE — Consult Note (Signed)
   [facility] Inpatient Consult   07/21/2020  EILAM SHREWSBURY [birth year] [id number]   A user error has taken place:   [place] Organization [ACO] Patient:  Patient has been screened for [facility] care management services.   Noted per record patient will discharged home with referral to  Palliative care services by [employer] . As noted patient care provider is not is listed as provider within [place] care organization   Please see duplicate note  Joylene Draft, RN, BSN  [place] Management Coordinator  [phone]- Mobile [phone]- [place]

## 2020-07-21 NOTE — Consult Note (Signed)
   [facility] Inpatient Consult   07/21/2020  Tony Marshall 06/10/1941 [id number]   [place] Organization [ACO]   Patient screened for [facility] care management services : less than 30 day readmission.  Noted patient is not a beneficiary currently attributed to one of the [place] Registry populations. Patient's primary care provider is not an  in network provider at this time . Membership roster was used to verify patient status.   Of note [facility] Care Management services does not replace or interfere with any services that are arranged by inpatient Transition of care team. For additional questions or referrals please contact .  For questions please or referrals please contact.    Joylene Draft, RN, BSN  [employer] Management Coordinator  [phone]- Mobile [phone]- Toll Free Main Office

## 2020-07-21 NOTE — Progress Notes (Signed)
Discharge instructions given to patient and all questions were answered.  

## 2020-07-21 NOTE — Progress Notes (Signed)
Pt completed chest vest therapy x 2 yesterday. This morning he prefers to do the flutter says the vest therapy wears him out. Flutter x10. Good effort. Pt has had some cough and mucous production with therapy.

## 2020-07-21 NOTE — Progress Notes (Addendum)
Daily Progress Note   Patient Name: Tony Marshall       Date: 07/21/2020 DOB: [birth year]  Age: 79 y.o. MRN#: [id number] Attending Physician: Florencia Reasons, MD Primary Care Physician: Elayne Snare, MD Admit Date: 07/19/2020  Reason for Consultation/Follow-up: Establishing goals of care  Subjective: No significant overnight events. Pt seen this afternoon with family present at bedside. We discussed palliative vs hospice. Family wishes to go with palliative care at home at this time.  Family is also noting that he has all necessary equipment at home already including hospital bed, commode, walker, etc.  Family also noting he has follow up appts set up with cardiology and oncology next week.  Length of Stay: 2  Current Medications: Scheduled Meds:  . allopurinol  100 mg Oral BID  . amiodarone  200 mg Oral Daily  . carvedilol  6.25 mg Oral BID WC  . chlorpheniramine-HYDROcodone  5 mL Oral Q12H  . dronabinol  2.5 mg Oral BID AC  . feeding supplement (ENSURE ENLIVE)  237 mL Oral TID BM  . folic acid  1 mg Oral Daily  . guaiFENesin  600 mg Oral BID  . insulin aspart  0-5 Units Subcutaneous QHS  . insulin aspart  0-9 Units Subcutaneous TID WC  . multivitamin with minerals  1 tablet Oral Daily  . pantoprazole  80 mg Oral Q1200  . polyethylene glycol  17 g Oral Daily  . potassium chloride SA  20 mEq Oral Daily  . rosuvastatin  5 mg Oral Daily  . sacubitril-valsartan  2 tablet Oral BID  . senna-docusate  1 tablet Oral BID  . thiamine  100 mg Oral Daily    Continuous Infusions:   PRN Meds: acetaminophen-codeine, ALPRAZolam, guaiFENesin, ipratropium, morphine CONCENTRATE, ondansetron **OR** ondansetron (ZOFRAN) IV, polyethylene glycol, sodium chloride     Vital Signs: BP (!) 141/82 (BP  Location: Right Arm)   Pulse 86   Temp 98.9 F (37.2 C) (Oral)   Resp 18   Ht 5\' 7"  (1.702 m)   Wt 52.6 kg   SpO2 100%   BMI 18.16 kg/m  SpO2: SpO2: 100 % O2 Device: O2 Device: Nasal Cannula O2 Flow Rate: O2 Flow Rate (L/min): 2 L/min   Gen: chronically ill, cachetic appearing HEENT: temporal wasting present Pulm: breathing comfortably on 2L supplemental oxygen  Psych: withdrawn affect Neuro: alert and oriented   Intake/output summary:   Intake/Output Summary (Last 24 hours) at 07/21/2020 1257 Last data filed at 07/21/2020 9030 Gross per 24 hour  Intake 837 ml  Output 400 ml  Net 437 ml   LBM: Last BM Date: 07/20/20 Baseline Weight: Weight: 52.6 kg Most recent weight: Weight: 52.6 kg       Palliative Assessment/Data: 30%      Patient Active Problem List   Diagnosis Date Noted  . Advanced care planning/counseling discussion   . Palliative care by specialist   . Cough   . DNR (do not resuscitate)   . Physical deconditioning 07/19/2020  . Stercoral ulcer of rectum   . Prolonged QT interval 07/03/2020  . Atrial fibrillation ([place])   . Shock circulatory ([place]) 07/01/2020  . Lower GI bleed   . Central venous catheter in place   . Fecal impaction ([place])   . Overflow diarrhea   . Rectal bleeding   . DKA (diabetic ketoacidoses) ([place]) 06/28/2020  . Systolic and diastolic CHF, chronic ([place]) 06/28/2020  . Anemia associated with acute blood loss   . Dehydration 06/27/2020  . Protein-calorie malnutrition, severe ([place]) 06/09/2020  . Hyperglycemia due to diabetes mellitus ([place])   . Leukopenia   . Acute combined systolic and diastolic heart failure ([place])   . Cardiogenic shock ([place])   . Altered mental status   . Failure to thrive in adult   . Symptomatic anemia   . Acute respiratory failure ([place])   . Increased anion gap metabolic acidosis 09/07/3006  . Goals of care, counseling/discussion 03/28/2020  . Encounter for antineoplastic chemotherapy 03/28/2020  . GERD  (gastroesophageal reflux disease) 05/13/2014  . History of colonic polyps 05/13/2014  . Hiatal hernia 05/13/2014  . CLL (chronic lymphocytic leukemia) ([place]) 10/25/2013  . ED (erectile dysfunction) 10/20/2013  . Lymphocytosis 09/23/2013  . Essential hypertension 07/21/2013  . Pure hypercholesterolemia 07/21/2013  . Diabetes mellitus ([place]) 07/20/2013    Palliative Care Assessment & Plan   Patient Profile: 64 yom with CLL, combined heart failure (EF 25-30%, G2DD), atrial fibrillation, T2DM admitted 07/19/20 with failure to thrive. Pt's overall functional status has been deteriorating over the past few months, and palliative care was consulted for a goals of care discussion.  Assessment/Recommendations/Plan   Discussed pt's oncology treatment plan with Dr. Earlie Server this morning. He notes that patient had been responding well to therapy and that he was close to finishing chemo. Unfortunately, he has been deteriorating over the past few months, and is not planning for any further chemotherapy at this point due to this.   We re discussed [place] with wife, patient and daughter. Goals, wishes and values important to the patient and family as a unit attempted to be explored. Acknowledged the patient's current condition, discussed about differences between hospice and palliative. At this time, goal concordant care would be to have palliative services at home.   Continue prn morphine and ativan  Goals of Care and Additional Recommendations:  Limitations on Scope of Treatment: Full scope of treatment  Code Status:  DNR  Prognosis:   < 6 -12 months  Discharge Planning:  Home with palliative  Care plan was discussed with patient, wife and daughter. Also checked in with CSW and TRH MD.    35 minutes spent.  Loistine Chance MD [place] palliative Greater than 50%  of this time was spent counseling and coordinating care related to the above assessment and plan.   Thank you  for allowing the  Palliative Medicine Team to assist in the care of this patient.  Please contact Palliative Medicine Team phone at [phone] for questions and concerns.    Mitzi Hansen, MD Internal Medicine Resident PGY-2 [name] Pager: [phone] 07/21/2020 12:57 PM

## 2020-07-24 ENCOUNTER — Telehealth: Payer: Self-pay

## 2020-07-24 ENCOUNTER — Telehealth: Payer: Self-pay | Admitting: Internal Medicine

## 2020-07-24 ENCOUNTER — Inpatient Hospital Stay: Payer: Medicare HMO

## 2020-07-24 ENCOUNTER — Inpatient Hospital Stay: Payer: Medicare HMO | Admitting: Internal Medicine

## 2020-07-24 NOTE — Telephone Encounter (Signed)
R/s 8/9 appt per patient request. Moved to 8/16. Called and spoke with patient. Confirmed appt

## 2020-07-24 NOTE — Telephone Encounter (Signed)
One pen of Tresiba U100 insulin has been signed out according to policy and labeled appropriately and placed in a [place] envelope with pt's name on it and placed back in the fridge. Pt notified that this sample was ready for pickup.

## 2020-07-24 NOTE — Telephone Encounter (Signed)
Medication Sample . Pt came into office and picked up sample of Tresiba U100 per Dr. Ronnie Derby instructions on 07/24/2020-.  Marland Kitchen Medication was signed out according to sample medication policy.  . Medication was labeled using approved label. . Medication sample was added to patient's medication list as a sample medication that had been given. . Medication was explained to patient and patient verbalized understanding of how to take properly.  . Pt was also instructed to call this office if any questions or concerns arise. Pt did verbalize understanding of this.  Lot number for medication is: [id number]  Expiration for medication is: 12/2021

## 2020-07-25 ENCOUNTER — Telehealth: Payer: Self-pay | Admitting: Adult Health Nurse Practitioner

## 2020-07-25 ENCOUNTER — Ambulatory Visit: Payer: Medicare HMO | Admitting: Cardiovascular Disease

## 2020-07-25 DIAGNOSIS — R627 Adult failure to thrive: Secondary | ICD-10-CM | POA: Diagnosis not present

## 2020-07-25 DIAGNOSIS — E440 Moderate protein-calorie malnutrition: Secondary | ICD-10-CM | POA: Diagnosis not present

## 2020-07-25 DIAGNOSIS — K222 Esophageal obstruction: Secondary | ICD-10-CM | POA: Diagnosis not present

## 2020-07-25 DIAGNOSIS — C9110 Chronic lymphocytic leukemia of B-cell type not having achieved remission: Secondary | ICD-10-CM | POA: Diagnosis not present

## 2020-07-25 DIAGNOSIS — K5790 Diverticulosis of intestine, part unspecified, without perforation or abscess without bleeding: Secondary | ICD-10-CM | POA: Diagnosis not present

## 2020-07-25 DIAGNOSIS — I110 Hypertensive heart disease with heart failure: Secondary | ICD-10-CM | POA: Diagnosis not present

## 2020-07-25 DIAGNOSIS — D630 Anemia in neoplastic disease: Secondary | ICD-10-CM | POA: Diagnosis not present

## 2020-07-25 DIAGNOSIS — G4730 Sleep apnea, unspecified: Secondary | ICD-10-CM | POA: Diagnosis not present

## 2020-07-25 DIAGNOSIS — I5042 Chronic combined systolic (congestive) and diastolic (congestive) heart failure: Secondary | ICD-10-CM | POA: Diagnosis not present

## 2020-07-25 DIAGNOSIS — E119 Type 2 diabetes mellitus without complications: Secondary | ICD-10-CM | POA: Diagnosis not present

## 2020-07-25 NOTE — Telephone Encounter (Signed)
Spoke with patient's wife Enid Derry, regarding Palliative referral and after discussing Palliative services with her she was in agreement with scheduling visit.  I have scheduled an In-person Consult for 08/01/20 @ 8:30 AM.

## 2020-07-28 ENCOUNTER — Other Ambulatory Visit: Payer: Self-pay

## 2020-07-28 ENCOUNTER — Inpatient Hospital Stay: Payer: Medicare HMO

## 2020-07-28 ENCOUNTER — Encounter: Payer: Self-pay | Admitting: Medical

## 2020-07-28 ENCOUNTER — Other Ambulatory Visit: Payer: Self-pay | Admitting: *Deleted

## 2020-07-28 ENCOUNTER — Encounter: Payer: Self-pay | Admitting: Internal Medicine

## 2020-07-28 ENCOUNTER — Inpatient Hospital Stay: Payer: Medicare HMO | Attending: Internal Medicine | Admitting: Medical

## 2020-07-28 VITALS — BP 112/67 | HR 80 | Temp 97.8°F | Resp 17 | Ht 67.0 in

## 2020-07-28 DIAGNOSIS — Z993 Dependence on wheelchair: Secondary | ICD-10-CM | POA: Insufficient documentation

## 2020-07-28 DIAGNOSIS — D649 Anemia, unspecified: Secondary | ICD-10-CM | POA: Insufficient documentation

## 2020-07-28 DIAGNOSIS — E860 Dehydration: Secondary | ICD-10-CM | POA: Diagnosis not present

## 2020-07-28 DIAGNOSIS — C9110 Chronic lymphocytic leukemia of B-cell type not having achieved remission: Secondary | ICD-10-CM | POA: Diagnosis not present

## 2020-07-28 DIAGNOSIS — R630 Anorexia: Secondary | ICD-10-CM | POA: Diagnosis not present

## 2020-07-28 DIAGNOSIS — R531 Weakness: Secondary | ICD-10-CM | POA: Insufficient documentation

## 2020-07-28 DIAGNOSIS — R627 Adult failure to thrive: Secondary | ICD-10-CM | POA: Diagnosis not present

## 2020-07-28 DIAGNOSIS — R5383 Other fatigue: Secondary | ICD-10-CM | POA: Insufficient documentation

## 2020-07-28 DIAGNOSIS — R05 Cough: Secondary | ICD-10-CM | POA: Diagnosis not present

## 2020-07-28 DIAGNOSIS — R059 Cough, unspecified: Secondary | ICD-10-CM

## 2020-07-28 LAB — SAMPLE TO BLOOD BANK

## 2020-07-28 LAB — CMP (CANCER CENTER ONLY)
ALT: <6 U/L (ref 0–44)
AST: 11 U/L — ABNORMAL LOW (ref 15–41)
Albumin: 2.1 g/dL — ABNORMAL LOW (ref 3.5–5.0)
Alkaline Phosphatase: 93 U/L (ref 38–126)
Anion gap: 10 (ref 5–15)
BUN: 13 mg/dL (ref 8–23)
CO2: 25 mmol/L (ref 22–32)
Calcium: 9.4 mg/dL (ref 8.9–10.3)
Chloride: 100 mmol/L (ref 98–111)
Creatinine: 0.67 mg/dL (ref 0.61–1.24)
GFR, Est AFR Am: >60 mL/min (ref >60)
GFR, Estimated: >60 mL/min (ref >60)
Glucose, Bld: 109 mg/dL — ABNORMAL HIGH (ref 70–99)
Potassium: 4.4 mmol/L (ref 3.5–5.1)
Sodium: 135 mmol/L (ref 135–145)
Total Bilirubin: 0.8 mg/dL (ref 0.3–1.2)
Total Protein: 5.3 g/dL — ABNORMAL LOW (ref 6.5–8.1)

## 2020-07-28 LAB — CBC WITH DIFFERENTIAL (CANCER CENTER ONLY)
Abs Immature Granulocytes: 0.14 10*3/uL — ABNORMAL HIGH (ref 0.00–0.07)
Basophils Absolute: 0 10*3/uL (ref 0.0–0.1)
Basophils Relative: 1 %
Eosinophils Absolute: 0.1 10*3/uL (ref 0.0–0.5)
Eosinophils Relative: 3 %
HCT: 26.9 % — ABNORMAL LOW (ref 39.0–52.0)
Hemoglobin: 8.6 g/dL — ABNORMAL LOW (ref 13.0–17.0)
Immature Granulocytes: 3 %
Lymphocytes Relative: 21 %
Lymphs Abs: 0.9 10*3/uL (ref 0.7–4.0)
MCH: 29.9 pg (ref 26.0–34.0)
MCHC: 32 g/dL (ref 30.0–36.0)
MCV: 93.4 fL (ref 80.0–100.0)
Monocytes Absolute: 0.3 10*3/uL (ref 0.1–1.0)
Monocytes Relative: 6 %
Neutro Abs: 3 10*3/uL (ref 1.7–7.7)
Neutrophils Relative %: 66 %
Platelet Count: 317 10*3/uL (ref 150–400)
RBC: 2.88 MIL/uL — ABNORMAL LOW (ref 4.22–5.81)
RDW: 17.6 % — ABNORMAL HIGH (ref 11.5–15.5)
WBC Count: 4.5 10*3/uL (ref 4.0–10.5)
nRBC: 0 % (ref 0.0–0.2)

## 2020-07-28 LAB — LACTATE DEHYDROGENASE: LDH: 271 U/L — ABNORMAL HIGH (ref 98–192)

## 2020-07-28 LAB — URIC ACID: Uric Acid, Serum: <2 mg/dL — ABNORMAL LOW (ref 3.7–8.6)

## 2020-07-28 MED ORDER — SODIUM CHLORIDE 0.9 % IV SOLN
Freq: Once | INTRAVENOUS | Status: AC
  Filled 2020-07-28: qty 250

## 2020-07-28 MED ORDER — ALBUTEROL SULFATE HFA 108 (90 BASE) MCG/ACT IN AERS
2.0000 | INHALATION_SPRAY | Freq: Once | RESPIRATORY_TRACT | Status: AC
  Administered 2020-07-28: 2 via RESPIRATORY_TRACT

## 2020-07-28 MED ORDER — GUAIFENESIN-CODEINE 100-10 MG/5ML PO SOLN: 5.0000 mL | Freq: Four times a day (QID) | ORAL | 0 refills | Status: AC | PRN

## 2020-07-28 MED ORDER — ALBUTEROL SULFATE HFA 108 (90 BASE) MCG/ACT IN AERS
INHALATION_SPRAY | RESPIRATORY_TRACT | Status: AC
  Filled 2020-07-28: qty 6.7

## 2020-07-28 NOTE — Progress Notes (Signed)
Patients wife called stating that patient is "talking out of his head again". He acted like this before when he was anemic.   Per Sandi Mealy, PA labs/[facility] visit to evaluate for fluids or other issues.  Sample to blood bank also ordered.

## 2020-07-28 NOTE — Patient Instructions (Signed)

## 2020-07-28 NOTE — Progress Notes (Signed)
[facility] Progress Note   Tony Marshall [id number] 05/16/1941 79 y.o.  Tony Marshall is managed by Dr. Fanny Bien. Mohamed  Actively treated with chemotherapy/immunotherapy/hormonal therapy: yes  Current therapy: Rituximab and Bendeka  Next scheduled appointment with provider: 07/31/2020  Assessment: Plan:    CLL (chronic lymphocytic leukemia) ([place])   Chronic lymphocytic leukemia: The patient is status post cycle 3 of rituximab and Bendeka.  He is scheduled to be seen in follow-up on 07/31/2020. The patient's wife reports that they are to meet with Hospice/Palliative Care on Monday. He was given 500 ml of normal saline today. He was not given more due to his history of congestive heart failure.  Please see After Visit Summary for patient specific instructions.  Future Appointments  Date Time Provider [place]  07/31/2020  3:15 PM [facility] 5 [facility] None  07/31/2020  3:45 PM Curt Bears, MD [facility] None  08/01/2020  8:30 AM Berneta Sages, NP [facility] None  08/07/2020  1:30 PM Elayne Snare, MD [facility] None  08/10/2020 11:15 AM Marilynn Rail, Jossie Ng, NP [facility]    No orders of the defined types were placed in this encounter.      Subjective:   Patient ID:  Tony Marshall is a 79 y.o. (DOB [birth year]) male.  Chief Complaint:  No chief complaint on file.   Diarrhea  Pertinent negatives include no abdominal pain, chills, coughing, fever, headaches, myalgias or vomiting.   Tony Marshall  is a 79 y.o. male with a diagnosis of chronic lymphocytic leukemia who is managed by Dr. Julien Nordmann.  The patient is status post cycle 3 of rituximab and Bendeka.  He presents to the clinic today with his wife.  The patient has increased weakness, fatigue, anorexia and failure to thrive. The patient would like to receive IV fluids today. Mrs. Gamino reports that they will be meeting with hospice/palliative care on  Monday.  Medications: I have reviewed the patient's current medications.  Allergies:  Allergies  Allergen Reactions  . Glycopyrrolate Rash    Past Medical History:  Diagnosis Date  . A-fib ([place])   . CLL (chronic lymphocytic leukemia) ([place]) dx'd 2014   progression 12/2019  . Diabetes mellitus without complication ([place])   . Diverticulosis   . External hemorrhoids   . GERD (gastroesophageal reflux disease)   . Hiatal hernia   . History of colon polyps   . Hyperlipidemia   . Hypertension   . Internal hemorrhoids   . Lymphocytosis   . Schatzki's ring   . Sleep apnea     Past Surgical History:  Procedure Laterality Date  . BLEPHAROPLASTY    . COLONOSCOPY  2012  . FLEXIBLE SIGMOIDOSCOPY N/A 07/05/2020   Procedure: FLEXIBLE SIGMOIDOSCOPY;  Surgeon: Thornton Park, MD;  Location: [facility];  Service: Gastroenterology;  Laterality: N/A;  . HEMORRHOID SURGERY     Sclerotherapy  . KNEE SURGERY Right 2002  . SHOULDER SURGERY Right 2007    Family History  Problem Relation Age of Onset  . Kidney disease Mother   . CAD Maternal Aunt   . Colon cancer Neg Hx   . Esophageal cancer Neg Hx   . Rectal cancer Neg Hx   . Stomach cancer Neg Hx     Social History   Socioeconomic History  . Marital status: Married    Spouse name: Not on file  . Number of children: 1  . Years of education: Not on file  . Highest education  level: Not on file  Occupational History  . Occupation: Retail buyer: [employer]  Tobacco Use  . Smoking status: Never Smoker  . Smokeless tobacco: Former Systems developer    Types: Secondary school teacher  . Vaping Use: Never used  Substance and Sexual Activity  . Alcohol use: Yes    Alcohol/week: 6.0 standard drinks    Types: 6 Cans of beer per week  . Drug use: No  . Sexual activity: Yes  Other Topics Concern  . Not on file  Social History Narrative  . Not on file   Social Determinants of Health   Financial Resource Strain:   .  Difficulty of Paying Living Expenses:   Food Insecurity:   . Worried About Charity fundraiser in the Last Year:   . Arboriculturist in the Last Year:   Transportation Needs:   . Film/video editor (Medical):   Marland Kitchen Lack of Transportation (Non-Medical):   Physical Activity:   . Days of Exercise per Week:   . Minutes of Exercise per Session:   Stress:   . Feeling of Stress :   Social Connections:   . Frequency of Communication with Friends and Family:   . Frequency of Social Gatherings with Friends and Family:   . Attends Religious Services:   . Active Member of Clubs or Organizations:   . Attends Archivist Meetings:   Marland Kitchen Marital Status:   Intimate Partner Violence:   . Fear of Current or Ex-Partner:   . Emotionally Abused:   Marland Kitchen Physically Abused:   . Sexually Abused:     Past Medical History, Surgical history, Social history, and Family history were reviewed and updated as appropriate.   Please see review of systems for further details on the patient's review from today.   Review of Systems:  Review of Systems  Constitutional: Positive for appetite change and fatigue. Negative for chills, diaphoresis and fever.  HENT: Negative for trouble swallowing and voice change.   Respiratory: Negative for cough, chest tightness, shortness of breath and wheezing.   Cardiovascular: Negative for chest pain and palpitations.  Gastrointestinal: Negative for abdominal pain, constipation, diarrhea, nausea and vomiting.  Musculoskeletal: Negative for back pain and myalgias.  Neurological: Positive for dizziness and weakness. Negative for light-headedness and headaches.  Psychiatric/Behavioral: Negative for confusion.    Objective:   Physical Exam:  BP (!) 98/59 (BP Location: Left Arm, Patient Position: Sitting)   Pulse 81   Temp 97.8 F (36.6 C) (Temporal)   Resp 17   Ht 5\' 7"  (1.702 m)   SpO2 98% Comment: room air  BMI 18.16 kg/m  ECOG: 1  Physical  Exam Constitutional:      General: He is not in acute distress.    Appearance: He is not diaphoretic.  HENT:     Head: Normocephalic and atraumatic.  Eyes:     General:        Right eye: No discharge.        Left eye: No discharge.     Conjunctiva/sclera: Conjunctivae normal.  Cardiovascular:     Rate and Rhythm: Normal rate and regular rhythm.     Heart sounds: Normal heart sounds. No murmur heard.  No friction rub. No gallop.   Pulmonary:     Effort: Pulmonary effort is normal. No respiratory distress.     Breath sounds: Normal breath sounds. No wheezing or rales.  Abdominal:     General:  Bowel sounds are normal. There is no distension.     Tenderness: There is no abdominal tenderness. There is no guarding or rebound.  Skin:    General: Skin is warm and dry.     Findings: No erythema or rash.  Neurological:     Mental Status: He is alert.     Gait: Gait abnormal (The patient is ambulating with the use of a wheelchair.).  Psychiatric:        Attention and Perception: Attention and perception normal.        Mood and Affect: Mood and affect normal.        Speech: Speech normal.        Behavior: Behavior normal. Behavior is cooperative.        Thought Content: Thought content normal.        Cognition and Memory: Cognition is not impaired. Memory is not impaired.     Lab Review:     Component Value Date/Time   NA 132 (L) 07/21/2020 0442   NA 141 08/06/2017 1306   K 4.0 07/21/2020 0442   K 4.3 08/06/2017 1306   CL 98 07/21/2020 0442   CO2 25 07/21/2020 0442   CO2 25 08/06/2017 1306   GLUCOSE 232 (H) 07/21/2020 0442   GLUCOSE 150 (H) 08/06/2017 1306   BUN 15 07/21/2020 0442   BUN 22.9 08/06/2017 1306   CREATININE 0.53 (L) 07/21/2020 0442   CREATININE 0.74 06/27/2020 1010   CREATININE 1.3 08/06/2017 1306   CALCIUM 8.0 (L) 07/21/2020 0442   CALCIUM 9.3 08/06/2017 1306   PROT 4.4 (L) 07/20/2020 0512   PROT 6.5 08/06/2017 1306   ALBUMIN 2.0 (L) 07/20/2020 0512    ALBUMIN 3.8 08/06/2017 1306   AST 15 07/20/2020 0512   AST 13 (L) 06/27/2020 1010   AST 20 08/06/2017 1306   ALT 10 07/20/2020 0512   ALT 7 06/27/2020 1010   ALT 19 08/06/2017 1306   ALKPHOS 61 07/20/2020 0512   ALKPHOS 47 08/06/2017 1306   BILITOT 0.7 07/20/2020 0512   BILITOT 0.9 06/27/2020 1010   BILITOT 0.46 08/06/2017 1306   GFRNONAA >60 07/21/2020 0442   GFRNONAA >60 06/27/2020 1010   GFRAA >60 07/21/2020 0442   GFRAA >60 06/27/2020 1010       Component Value Date/Time   WBC 4.5 07/28/2020 1440   WBC 3.8 (L) 07/20/2020 0922   RBC 2.88 (L) 07/28/2020 1440   HGB 8.6 (L) 07/28/2020 1440   HGB 10.8 (L) 06/10/2020 1129   HGB 13.2 11/05/2017 1308   HCT 26.9 (L) 07/28/2020 1440   HCT 40.8 11/05/2017 1308   PLT 317 07/28/2020 1440   PLT 205 11/05/2017 1308   MCV 93.4 07/28/2020 1440   MCV 95.8 11/05/2017 1308   MCH 29.9 07/28/2020 1440   MCHC 32.0 07/28/2020 1440   RDW 17.6 (H) 07/28/2020 1440   RDW 14.0 11/05/2017 1308   LYMPHSABS PENDING 07/28/2020 1440   LYMPHSABS 66.1 (H) 11/05/2017 1308   MONOABS PENDING 07/28/2020 1440   MONOABS 1.4 (H) 11/05/2017 1308   EOSABS PENDING 07/28/2020 1440   EOSABS 0.3 11/05/2017 1308   BASOSABS PENDING 07/28/2020 1440   BASOSABS 0.2 (H) 11/05/2017 1308   -------------------------------  Imaging from last 24 hours (if applicable):  Radiology interpretation: DG Chest 2 View  Result Date: 07/19/2020 CLINICAL DATA:  Cough and shortness of breath EXAM: CHEST - 2 VIEW COMPARISON:  July 09, 2020 FINDINGS: There is airspace opacity with a degree of consolidation in  the posteromedial left base. There is more subtle opacity in the medial right base. Lungs elsewhere are clear. Heart size and pulmonary vascularity are normal. No adenopathy. There is a skin fold on the left. No pneumothorax. No bone lesions. IMPRESSION: Consolidation consistent with pneumonia in the posteromedial left base. More subtle airspace opacity medial right base. Lungs  elsewhere clear. Heart size normal. No evident adenopathy. Electronically Signed   By: Lowella Grip III M.D.   On: 07/19/2020 15:47   DG Chest 2 View  Result Date: 07/09/2020 CLINICAL DATA:  Pneumonia. EXAM: CHEST - 2 VIEW COMPARISON:  07/08/2020 FINDINGS: Lungs are somewhat hypoinflated demonstrate hazy opacification over the medial right base and left retrocardiac region likely representing layering small to moderate effusions as seen on the lateral film with associated basilar atelectasis. Small nodule opacity over the right mid lung not seen previously. Cardiomediastinal silhouette and remainder of the exam is unchanged. IMPRESSION: Small to moderate bilateral layering pleural effusions likely with associated bibasilar atelectasis. 2. Small nodular density projects over the right midlung. Recommend follow-up PA chest radiograph. If this persists, then CT chest is recommended on elective basis. Electronically Signed   By: Marin Olp M.D.   On: 07/09/2020 14:40   CT Angio Chest PE W/Cm &/Or Wo Cm  Result Date: 07/19/2020 CLINICAL DATA:  79 year old male with concern for pulmonary embolism. EXAM: CT ANGIOGRAPHY CHEST WITH CONTRAST TECHNIQUE: Multidetector CT imaging of the chest was performed using the standard protocol during bolus administration of intravenous contrast. Multiplanar CT image reconstructions and MIPs were obtained to evaluate the vascular anatomy. CONTRAST:  158mL OMNIPAQUE IOHEXOL 350 MG/ML SOLN COMPARISON:  Chest CT dated 05/16/2020. FINDINGS: Cardiovascular: There is no cardiomegaly. There is trace pericardial effusion. Advanced 3 vessel coronary vascular calcification. There is moderate atherosclerotic calcification of the thoracic aorta. Evaluation of the pulmonary arteries is somewhat limited due to respiratory motion artifact and suboptimal visualization of the peripheral branches. No central pulmonary artery embolus identified. Mediastinum/Nodes: There is no hilar or  mediastinal adenopathy. The esophagus is grossly unremarkable. No mediastinal fluid collection. Lungs/Pleura: Small bilateral pleural effusions with partial consolidative changes of the lower lobes, new since the prior CT and most consistent with pneumonia. There is a cluster of nodular density at the right lung base as well as to a lesser degree involving the right middle lobe most consistent with infiltrate. Aspiration is not excluded clinical correlation is recommended. There is no pneumothorax. Mucus secretions noted in the trachea and bilateral mainstem bronchus. Upper Abdomen: High attenuating content within the gallbladder, likely vicarious excretion of contrast. Layering sludge or stones are not excluded. Musculoskeletal: Osteopenia with degenerative changes of the spine. There is diffuse subcutaneous edema. No acute osseous pathology. Review of the MIP images confirms the above findings. IMPRESSION: 1. No CT evidence of central pulmonary artery embolus. 2. Small bilateral pleural effusions with partial consolidative changes of the lower lobes most consistent with pneumonia. Aspiration is not excluded. Clinical correlation is recommended. 3. Aortic Atherosclerosis (ICD10-I70.0). Electronically Signed   By: Anner Crete M.D.   On: 07/19/2020 18:01   DG CHEST PORT 1 VIEW  Result Date: 07/08/2020 CLINICAL DATA:  Progressive coughing. EXAM: PORTABLE CHEST 1 VIEW COMPARISON:  07/01/2020 FINDINGS: Normal heart size. Aortic atherosclerotic calcifications noted. No pleural effusion or edema identified. There is a retrocardiac opacity within the medial left lung base which may represent atelectasis or airspace disease. Right lung appears clear. IMPRESSION: Retrocardiac opacity within the left lung base which may represent atelectasis  or airspace disease. Electronically Signed   By: Kerby Moors M.D.   On: 07/08/2020 05:27   DG CHEST PORT 1 VIEW  Result Date: 07/01/2020 CLINICAL DATA:  79 year old male  with acute respiratory failure EXAM: PORTABLE CHEST 1 VIEW COMPARISON:  06/30/2020 FINDINGS: Cardiomediastinal silhouette unchanged in size and contour. Similar appearance of architectural distortion of the right greater than left lungs. No pneumothorax. No large pleural effusion. Coarsened interstitial markings persist with no new confluent airspace disease. Interval slight withdrawal of the left IJ central venous catheter with the tip appearing to terminate superior vena cava. IMPRESSION: Similar appearance of the lungs, with chronic lung changes, scarring/atelectasis and no new confluent airspace disease. Interval slight withdrawal of left IJ central venous catheter. Electronically Signed   By: Corrie Mckusick D.O.   On: 07/01/2020 08:54   DG Chest Port 1 View  Result Date: 06/30/2020 CLINICAL DATA:  Central line placement. EXAM: PORTABLE CHEST 1 VIEW COMPARISON:  June 10, 2020. FINDINGS: The heart size and mediastinal contours are within normal limits. No pneumothorax is noted. Left lung is clear. Mild right basilar subsegmental atelectasis is noted. Interval placement of left internal jugular catheter with distal tip in expected position of right atrium. The visualized skeletal structures are unremarkable. IMPRESSION: Interval placement of left internal jugular catheter with distal tip in expected position of right atrium. Withdrawal by 3-4 cm is recommended. Mild right basilar subsegmental atelectasis is noted. Electronically Signed   By: Marijo Conception M.D.   On: 06/30/2020 16:10   DG Abd 2 Views  Result Date: 07/07/2020 CLINICAL DATA:  Fecal impaction EXAM: ABDOMEN - 2 VIEW COMPARISON:  None FINDINGS: Normal retained stool burden. Specifically no abnormal retained stool burden seen within the rectum. Scattered gas throughout colon. Small bowel gas pattern normal.  Bones demineralized. IMPRESSION: Normal bowel gas pattern. No abnormal retained stool burden. Electronically Signed   By: Lavonia Dana M.D.    On: 07/07/2020 12:51   DG Abd Portable 1V  Result Date: 07/03/2020 CLINICAL DATA:  79 y.o. male with constipation. Medical history significant of CLL, DM, HTN, HLD, combined systolic/diastolic CHF EXAM: PORTABLE ABDOMEN - 1 VIEW COMPARISON:  Abdominal radiograph 07/01/2020 FINDINGS: The far left hemiabdomen is excluded from field of view. The bowel gas pattern is nonobstructive. Moderate stool burden. No supine evidence for free air. No unexpected radiopaque calcification. No acute finding in the visualized skeleton. IMPRESSION: Nonobstructive bowel gas pattern. Moderate stool burden. Electronically Signed   By: Audie Pinto M.D.   On: 07/03/2020 08:34   DG Abd Portable 1V  Result Date: 07/01/2020 CLINICAL DATA:  Fecal impaction. EXAM: PORTABLE ABDOMEN - 1 VIEW COMPARISON:  February 02, 2018. FINDINGS: No abnormal bowel dilatation is noted. Mild amount of stool is noted in the rectum. No radio-opaque calculi or other significant radiographic abnormality are seen. IMPRESSION: Mild stool burden is noted. No evidence of bowel obstruction or ileus. Electronically Signed   By: Marijo Conception M.D.   On: 07/01/2020 15:34   VAS [place] UPPER EXTREMITY VENOUS DUPLEX  Result Date: 07/04/2020 UPPER VENOUS STUDY  Indications: Edema Risk Factors: None identified. Comparison Study: No prior studies. Performing Technologist: Oliver Hum RVT  Examination Guidelines: A complete evaluation includes B-mode imaging, spectral Doppler, color Doppler, and power Doppler as needed of all accessible portions of each vessel. Bilateral testing is considered an integral part of a complete examination. Limited examinations for reoccurring indications may be performed as noted.  Right Findings: +----------+------------+---------+-----------+----------+-----------------+ RIGHT  CompressiblePhasicitySpontaneousProperties     Summary      +----------+------------+---------+-----------+----------+-----------------+ IJV            Full       Yes       Yes                                +----------+------------+---------+-----------+----------+-----------------+ Subclavian  Partial      No        No               Age Indeterminate +----------+------------+---------+-----------+----------+-----------------+ Axillary      Full       Yes       Yes                                +----------+------------+---------+-----------+----------+-----------------+ Brachial      Full       Yes       Yes                                +----------+------------+---------+-----------+----------+-----------------+ Radial        Full                                                    +----------+------------+---------+-----------+----------+-----------------+ Ulnar         Full                                                    +----------+------------+---------+-----------+----------+-----------------+ Cephalic      Full                                                    +----------+------------+---------+-----------+----------+-----------------+ Basilic       Full                                                    +----------+------------+---------+-----------+----------+-----------------+  Left Findings: +----------+------------+---------+-----------+----------+-------+ LEFT      CompressiblePhasicitySpontaneousPropertiesSummary +----------+------------+---------+-----------+----------+-------+ Subclavian    Full       Yes       Yes                      +----------+------------+---------+-----------+----------+-------+  Summary:  Right: No evidence of superficial vein thrombosis in the upper extremity. 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  Left: No evidence of thrombosis in the subclavian.  *See table(s) above for measurements and observations.  Diagnosing physician: Servando Snare MD Electronically signed by Servando Snare MD  on 07/04/2020 at 5:29:09 PM.    Final      .

## 2020-07-31 ENCOUNTER — Other Ambulatory Visit: Payer: Self-pay

## 2020-07-31 ENCOUNTER — Inpatient Hospital Stay: Payer: Medicare HMO

## 2020-07-31 ENCOUNTER — Encounter: Payer: Self-pay | Admitting: Internal Medicine

## 2020-07-31 ENCOUNTER — Inpatient Hospital Stay (HOSPITAL_BASED_OUTPATIENT_CLINIC_OR_DEPARTMENT_OTHER): Payer: Medicare HMO | Admitting: Internal Medicine

## 2020-07-31 VITALS — BP 106/62 | HR 87 | Temp 97.9°F | Resp 18 | Ht 67.0 in

## 2020-07-31 DIAGNOSIS — R627 Adult failure to thrive: Secondary | ICD-10-CM | POA: Diagnosis not present

## 2020-07-31 DIAGNOSIS — R630 Anorexia: Secondary | ICD-10-CM | POA: Diagnosis not present

## 2020-07-31 DIAGNOSIS — I10 Essential (primary) hypertension: Secondary | ICD-10-CM

## 2020-07-31 DIAGNOSIS — E860 Dehydration: Secondary | ICD-10-CM

## 2020-07-31 DIAGNOSIS — C9110 Chronic lymphocytic leukemia of B-cell type not having achieved remission: Secondary | ICD-10-CM

## 2020-07-31 DIAGNOSIS — R05 Cough: Secondary | ICD-10-CM | POA: Diagnosis not present

## 2020-07-31 DIAGNOSIS — I5042 Chronic combined systolic (congestive) and diastolic (congestive) heart failure: Secondary | ICD-10-CM

## 2020-07-31 DIAGNOSIS — R739 Hyperglycemia, unspecified: Secondary | ICD-10-CM

## 2020-07-31 DIAGNOSIS — R5383 Other fatigue: Secondary | ICD-10-CM | POA: Diagnosis not present

## 2020-07-31 DIAGNOSIS — D649 Anemia, unspecified: Secondary | ICD-10-CM | POA: Diagnosis not present

## 2020-07-31 DIAGNOSIS — Z993 Dependence on wheelchair: Secondary | ICD-10-CM | POA: Diagnosis not present

## 2020-07-31 DIAGNOSIS — R531 Weakness: Secondary | ICD-10-CM | POA: Diagnosis not present

## 2020-07-31 LAB — CBC WITH DIFFERENTIAL (CANCER CENTER ONLY)
Abs Immature Granulocytes: 0.13 10*3/uL — ABNORMAL HIGH (ref 0.00–0.07)
Basophils Absolute: 0 10*3/uL (ref 0.0–0.1)
Basophils Relative: 1 %
Eosinophils Absolute: 0.1 10*3/uL (ref 0.0–0.5)
Eosinophils Relative: 3 %
HCT: 27.7 % — ABNORMAL LOW (ref 39.0–52.0)
Hemoglobin: 9 g/dL — ABNORMAL LOW (ref 13.0–17.0)
Immature Granulocytes: 3 %
Lymphocytes Relative: 24 %
Lymphs Abs: 1.1 10*3/uL (ref 0.7–4.0)
MCH: 30 pg (ref 26.0–34.0)
MCHC: 32.5 g/dL (ref 30.0–36.0)
MCV: 92.3 fL (ref 80.0–100.0)
Monocytes Absolute: 0.3 10*3/uL (ref 0.1–1.0)
Monocytes Relative: 6 %
Neutro Abs: 2.9 10*3/uL (ref 1.7–7.7)
Neutrophils Relative %: 63 %
Platelet Count: 345 10*3/uL (ref 150–400)
RBC: 3 MIL/uL — ABNORMAL LOW (ref 4.22–5.81)
RDW: 17.8 % — ABNORMAL HIGH (ref 11.5–15.5)
WBC Count: 4.5 10*3/uL (ref 4.0–10.5)
nRBC: 0 % (ref 0.0–0.2)

## 2020-07-31 LAB — CMP (CANCER CENTER ONLY)
ALT: <6 U/L (ref 0–44)
AST: 14 U/L — ABNORMAL LOW (ref 15–41)
Albumin: 2 g/dL — ABNORMAL LOW (ref 3.5–5.0)
Alkaline Phosphatase: 86 U/L (ref 38–126)
Anion gap: 8 (ref 5–15)
BUN: 12 mg/dL (ref 8–23)
CO2: 25 mmol/L (ref 22–32)
Calcium: 9.2 mg/dL (ref 8.9–10.3)
Chloride: 102 mmol/L (ref 98–111)
Creatinine: 0.69 mg/dL (ref 0.61–1.24)
GFR, Est AFR Am: >60 mL/min (ref >60)
GFR, Estimated: >60 mL/min (ref >60)
Glucose, Bld: 134 mg/dL — ABNORMAL HIGH (ref 70–99)
Potassium: 4.4 mmol/L (ref 3.5–5.1)
Sodium: 135 mmol/L (ref 135–145)
Total Bilirubin: 0.7 mg/dL (ref 0.3–1.2)
Total Protein: 5.1 g/dL — ABNORMAL LOW (ref 6.5–8.1)

## 2020-07-31 LAB — LACTATE DEHYDROGENASE: LDH: 227 U/L — ABNORMAL HIGH (ref 98–192)

## 2020-07-31 NOTE — Progress Notes (Signed)
[place] [phone]   [phone]  OFFICE PROGRESS NOTE  [address], [place] [address]  DIAGNOSIS: Chronic lymphocytic leukemia diagnosed in October 2014.  PRIOR THERAPY: None  CURRENT THERAPY: Systemic chemotherapy with bendamustine 90 mg/M2 on days 1 and 2 and Rituxan 375 mg/M2 on day 1 every 4 weeks.  First dose 04/04/2020.  Status post 3 cycles.  INTERVAL HISTORY: Tony Marshall 79 y.o. male returns to the clinic today for follow-up visit accompanied by his wife.  The patient continues to complain of increasing fatigue and weakness as well as persistent cough.  He was diagnosed in a recent hospitalization with congestive heart failure with ejection fraction of 25%.  The patient was a started on Entresto during his hospitalization.  He continues to have significant cough since starting this medication.  Is supposed to see his cardiologist later this month.  He denied having any current chest pain but has shortness of breath with exertion and no hemoptysis.  He denied having any fever or chills.  He has no nausea, vomiting, diarrhea or constipation.  He continues to have lack of appetite.  He is here today for evaluation and repeat blood work.  MEDICAL HISTORY: Past Medical History:  Diagnosis Date  . A-fib ([place])   . CLL (chronic lymphocytic leukemia) ([place]) dx'd 2014   progression 12/2019  . Diabetes mellitus without complication ([place])   . Diverticulosis   . External hemorrhoids   . GERD (gastroesophageal reflux disease)   . Hiatal hernia   . History of colon polyps   . Hyperlipidemia   . Hypertension   . Internal hemorrhoids   . Lymphocytosis   . Schatzki's ring   . Sleep apnea     ALLERGIES:  is allergic to glycopyrrolate.  MEDICATIONS:  Current Outpatient Medications  Medication Sig Dispense Refill  . benzonatate (TESSALON PERLES) 100 MG capsule Take 100 mg by mouth 3 (three) times daily as  needed for cough.    Marland Kitchen guaiFENesin-codeine 100-10 MG/5ML syrup Take 5 mLs by mouth every 6 (six) hours as needed for cough. 240 mL 0  . acetaminophen (TYLENOL) 500 MG tablet Take 500 mg by mouth every 6 (six) hours as needed for moderate pain.    Marland Kitchen allopurinol (ZYLOPRIM) 100 MG tablet Take 1 tablet (100 mg total) by mouth 2 (two) times daily. 60 tablet 2  . ALPRAZolam (XANAX) 0.25 MG tablet Take 1 tablet (0.25 mg total) by mouth at bedtime as needed for anxiety. 30 tablet 0  . amiodarone (PACERONE) 200 MG tablet Take 1 tablet (200 mg total) by mouth daily. 30 tablet 0  . carvedilol (COREG) 12.5 MG tablet Take 1 tablet (12.5 mg total) by mouth 2 (two) times daily with a meal. 60 tablet 0  . chlorpheniramine-HYDROcodone (TUSSIONEX) 10-8 MG/5ML SUER Take 5 mLs by mouth every 12 (twelve) hours. 140 mL 0  . dronabinol (MARINOL) 2.5 MG capsule TAKE 1 CAPSULE BY MOUTH TWICE DAILY BEFORE A MEAL (Patient taking differently: Take 2.5 mg by mouth 2 (two) times daily before lunch and supper. ) 30 capsule 0  . esomeprazole (NEXIUM) 40 MG capsule TAKE 1 CAPSULE BY MOUTH EVERY MORNING 30 MINUTES BEFORE BREAKFAST (Patient taking differently: Take 40 mg by mouth daily. ) 90 capsule 0  . feeding supplement, ENSURE ENLIVE, (ENSURE ENLIVE) LIQD Take 237 mLs by mouth 3 (three) times daily between meals.    Marland Kitchen glucagon (GLUCAGON EMERGENCY)  1 MG injection Inject 1 mg into the skin once as needed (For severe low blood sugar). 1 each 5  . guaiFENesin (MUCINEX) 600 MG 12 hr tablet Take 1 tablet (600 mg total) by mouth 2 (two) times daily. 30 tablet 0  . insulin aspart (NOVOLOG FLEXPEN) 100 UNIT/ML FlexPen Inject 3-5 Units into the skin 3 (three) times daily with meals. Depending on the blood sugar level.    . insulin degludec (TRESIBA FLEXTOUCH) 100 UNIT/ML FlexTouch Pen INJECT 12 UNITS DAILY. IF FASTING BLOOD SUGARS ARE OVER 140 INCREASE DOSAGE UP TO 15 UNITS DAILY AS DIRECTED. 15 pen 3  . ONETOUCH VERIO test strip USE 1 STRIP  TO CHECK GLUCOSE THREE TIMES DAILY 100 each 5  . oxyCODONE (OXY IR/ROXICODONE) 5 MG immediate release tablet Take 1 tablet (5 mg total) by mouth every 4 (four) hours as needed for severe pain. 7 tablet 0  . oxymetazoline (AFRIN) 0.05 % nasal spray Place 1 spray into both nostrils 2 (two) times daily as needed for congestion.    . pioglitazone (ACTOS) 15 MG tablet Take 15 mg by mouth daily.    . polyethylene glycol (MIRALAX / GLYCOLAX) 17 g packet Take 17 g by mouth daily as needed for severe constipation. hold if having multiple loose stools per day 30 each 0  . potassium chloride SA (KLOR-CON) 20 MEQ tablet Take 1 tablet (20 mEq total) by mouth daily. 30 tablet 0  . rosuvastatin (CRESTOR) 5 MG tablet TAKE 1 TABLET BY MOUTH EVERY DAY (Patient taking differently: Take 5 mg by mouth daily. ) 90 tablet 0  . sacubitril-valsartan (ENTRESTO) 24-26 MG Take 2 tablets by mouth 2 (two) times daily. 120 tablet 0  . senna (SENOKOT) 8.6 MG TABS tablet Take 1 tablet (8.6 mg total) by mouth at bedtime as needed for moderate constipation. 120 tablet 0  . traMADol (ULTRAM) 50 MG tablet Take 1 tablet (50 mg total) by mouth every 12 (twelve) hours as needed. 7 tablet 0   No current facility-administered medications for this visit.    SURGICAL HISTORY:  Past Surgical History:  Procedure Laterality Date  . BLEPHAROPLASTY    . COLONOSCOPY  2012  . FLEXIBLE SIGMOIDOSCOPY N/A 07/05/2020   Procedure: FLEXIBLE SIGMOIDOSCOPY;  Surgeon: Thornton Park, MD;  Location: [facility];  Service: Gastroenterology;  Laterality: N/A;  . HEMORRHOID SURGERY     Sclerotherapy  . KNEE SURGERY Right 2002  . SHOULDER SURGERY Right 2007    REVIEW OF SYSTEMS:  A comprehensive review of systems was negative except for: Constitutional: positive for anorexia, fatigue and weight loss Respiratory: positive for cough and dyspnea on exertion Musculoskeletal: positive for muscle weakness   PHYSICAL EXAMINATION: General appearance:  alert, cooperative, fatigued and no distress Head: Normocephalic, without obvious abnormality, atraumatic Neck: no JVD, supple, symmetrical, trachea midline and thyroid not enlarged, symmetric, no tenderness/mass/nodules Lymph nodes: Cervical, supraclavicular, and axillary nodes normal. Resp: clear to auscultation bilaterally Back: symmetric, no curvature. ROM normal. No CVA tenderness. Cardio: regular rate and rhythm, S1, S2 normal, no murmur, click, rub or gallop GI: soft, non-tender; bowel sounds normal; no masses,  no organomegaly Extremities: extremities normal, atraumatic, no cyanosis or edema  ECOG PERFORMANCE STATUS: 2 - Symptomatic, <50% confined to bed  Blood pressure 106/62, pulse 87, temperature 97.9 F (36.6 C), temperature source Temporal, resp. rate 18, height 5\' 7"  (1.702 m), SpO2 100 %.  LABORATORY DATA: Lab Results  Component Value Date   WBC 4.5 07/31/2020   HGB 9.0 (  L) 07/31/2020   HCT 27.7 (L) 07/31/2020   MCV 92.3 07/31/2020   PLT 345 07/31/2020      Chemistry      Component Value Date/Time   NA 135 07/31/2020 1543   NA 141 08/06/2017 1306   K 4.4 07/31/2020 1543   K 4.3 08/06/2017 1306   CL 102 07/31/2020 1543   CO2 25 07/31/2020 1543   CO2 25 08/06/2017 1306   BUN 12 07/31/2020 1543   BUN 22.9 08/06/2017 1306   CREATININE 0.69 07/31/2020 1543   CREATININE 1.3 08/06/2017 1306      Component Value Date/Time   CALCIUM 9.2 07/31/2020 1543   CALCIUM 9.3 08/06/2017 1306   ALKPHOS 86 07/31/2020 1543   ALKPHOS 47 08/06/2017 1306   AST 14 (L) 07/31/2020 1543   AST 20 08/06/2017 1306   ALT <6 07/31/2020 1543   ALT 19 08/06/2017 1306   BILITOT 0.7 07/31/2020 1543   BILITOT 0.46 08/06/2017 1306       RADIOGRAPHIC STUDIES: DG Chest 2 View  Result Date: 07/19/2020 CLINICAL DATA:  Cough and shortness of breath EXAM: CHEST - 2 VIEW COMPARISON:  July 09, 2020 FINDINGS: There is airspace opacity with a degree of consolidation in the posteromedial left  base. There is more subtle opacity in the medial right base. Lungs elsewhere are clear. Heart size and pulmonary vascularity are normal. No adenopathy. There is a skin fold on the left. No pneumothorax. No bone lesions. IMPRESSION: Consolidation consistent with pneumonia in the posteromedial left base. More subtle airspace opacity medial right base. Lungs elsewhere clear. Heart size normal. No evident adenopathy. Electronically Signed   By: Lowella Grip III M.D.   On: 07/19/2020 15:47   DG Chest 2 View  Result Date: 07/09/2020 CLINICAL DATA:  Pneumonia. EXAM: CHEST - 2 VIEW COMPARISON:  07/08/2020 FINDINGS: Lungs are somewhat hypoinflated demonstrate hazy opacification over the medial right base and left retrocardiac region likely representing layering small to moderate effusions as seen on the lateral film with associated basilar atelectasis. Small nodule opacity over the right mid lung not seen previously. Cardiomediastinal silhouette and remainder of the exam is unchanged. IMPRESSION: Small to moderate bilateral layering pleural effusions likely with associated bibasilar atelectasis. 2. Small nodular density projects over the right midlung. Recommend follow-up PA chest radiograph. If this persists, then CT chest is recommended on elective basis. Electronically Signed   By: Marin Olp M.D.   On: 07/09/2020 14:40   CT Angio Chest PE W/Cm &/Or Wo Cm  Result Date: 07/19/2020 CLINICAL DATA:  79 year old male with concern for pulmonary embolism. EXAM: CT ANGIOGRAPHY CHEST WITH CONTRAST TECHNIQUE: Multidetector CT imaging of the chest was performed using the standard protocol during bolus administration of intravenous contrast. Multiplanar CT image reconstructions and MIPs were obtained to evaluate the vascular anatomy. CONTRAST:  165mL OMNIPAQUE IOHEXOL 350 MG/ML SOLN COMPARISON:  Chest CT dated 05/16/2020. FINDINGS: Cardiovascular: There is no cardiomegaly. There is trace pericardial effusion. Advanced  3 vessel coronary vascular calcification. There is moderate atherosclerotic calcification of the thoracic aorta. Evaluation of the pulmonary arteries is somewhat limited due to respiratory motion artifact and suboptimal visualization of the peripheral branches. No central pulmonary artery embolus identified. Mediastinum/Nodes: There is no hilar or mediastinal adenopathy. The esophagus is grossly unremarkable. No mediastinal fluid collection. Lungs/Pleura: Small bilateral pleural effusions with partial consolidative changes of the lower lobes, new since the prior CT and most consistent with pneumonia. There is a cluster of nodular density at the  right lung base as well as to a lesser degree involving the right middle lobe most consistent with infiltrate. Aspiration is not excluded clinical correlation is recommended. There is no pneumothorax. Mucus secretions noted in the trachea and bilateral mainstem bronchus. Upper Abdomen: High attenuating content within the gallbladder, likely vicarious excretion of contrast. Layering sludge or stones are not excluded. Musculoskeletal: Osteopenia with degenerative changes of the spine. There is diffuse subcutaneous edema. No acute osseous pathology. Review of the MIP images confirms the above findings. IMPRESSION: 1. No CT evidence of central pulmonary artery embolus. 2. Small bilateral pleural effusions with partial consolidative changes of the lower lobes most consistent with pneumonia. Aspiration is not excluded. Clinical correlation is recommended. 3. Aortic Atherosclerosis (ICD10-I70.0). Electronically Signed   By: Anner Crete M.D.   On: 07/19/2020 18:01   DG CHEST PORT 1 VIEW  Result Date: 07/08/2020 CLINICAL DATA:  Progressive coughing. EXAM: PORTABLE CHEST 1 VIEW COMPARISON:  07/01/2020 FINDINGS: Normal heart size. Aortic atherosclerotic calcifications noted. No pleural effusion or edema identified. There is a retrocardiac opacity within the medial left lung  base which may represent atelectasis or airspace disease. Right lung appears clear. IMPRESSION: Retrocardiac opacity within the left lung base which may represent atelectasis or airspace disease. Electronically Signed   By: Kerby Moors M.D.   On: 07/08/2020 05:27   DG Abd 2 Views  Result Date: 07/07/2020 CLINICAL DATA:  Fecal impaction EXAM: ABDOMEN - 2 VIEW COMPARISON:  None FINDINGS: Normal retained stool burden. Specifically no abnormal retained stool burden seen within the rectum. Scattered gas throughout colon. Small bowel gas pattern normal.  Bones demineralized. IMPRESSION: Normal bowel gas pattern. No abnormal retained stool burden. Electronically Signed   By: Lavonia Dana M.D.   On: 07/07/2020 12:51   DG Abd Portable 1V  Result Date: 07/03/2020 CLINICAL DATA:  79 y.o. male with constipation. Medical history significant of CLL, DM, HTN, HLD, combined systolic/diastolic CHF EXAM: PORTABLE ABDOMEN - 1 VIEW COMPARISON:  Abdominal radiograph 07/01/2020 FINDINGS: The far left hemiabdomen is excluded from field of view. The bowel gas pattern is nonobstructive. Moderate stool burden. No supine evidence for free air. No unexpected radiopaque calcification. No acute finding in the visualized skeleton. IMPRESSION: Nonobstructive bowel gas pattern. Moderate stool burden. Electronically Signed   By: Audie Pinto M.D.   On: 07/03/2020 08:34   VAS [place] UPPER EXTREMITY VENOUS DUPLEX  Result Date: 07/04/2020 UPPER VENOUS STUDY  Indications: Edema Risk Factors: None identified. Comparison Study: No prior studies. Performing Technologist: Oliver Hum RVT  Examination Guidelines: A complete evaluation includes B-mode imaging, spectral Doppler, color Doppler, and power Doppler as needed of all accessible portions of each vessel. Bilateral testing is considered an integral part of a complete examination. Limited examinations for reoccurring indications may be performed as noted.  Right Findings:  +----------+------------+---------+-----------+----------+-----------------+ RIGHT     CompressiblePhasicitySpontaneousProperties     Summary      +----------+------------+---------+-----------+----------+-----------------+ IJV           Full       Yes       Yes                                +----------+------------+---------+-----------+----------+-----------------+ Subclavian  Partial      No        No               Age Indeterminate +----------+------------+---------+-----------+----------+-----------------+ Axillary  Full       Yes       Yes                                +----------+------------+---------+-----------+----------+-----------------+ Brachial      Full       Yes       Yes                                +----------+------------+---------+-----------+----------+-----------------+ Radial        Full                                                    +----------+------------+---------+-----------+----------+-----------------+ Ulnar         Full                                                    +----------+------------+---------+-----------+----------+-----------------+ Cephalic      Full                                                    +----------+------------+---------+-----------+----------+-----------------+ Basilic       Full                                                    +----------+------------+---------+-----------+----------+-----------------+  Left Findings: +----------+------------+---------+-----------+----------+-------+ LEFT      CompressiblePhasicitySpontaneousPropertiesSummary +----------+------------+---------+-----------+----------+-------+ Subclavian    Full       Yes       Yes                      +----------+------------+---------+-----------+----------+-------+  Summary:  Right: No evidence of superficial vein thrombosis in the upper extremity. Findings consistent with age indeterminate deep  vein thrombosis involving the right subclavian vein.  Left: No evidence of thrombosis in the subclavian.  *See table(s) above for measurements and observations.  Diagnosing physician: Servando Snare MD Electronically signed by Servando Snare MD on 07/04/2020 at 5:29:09 PM.    Final     ASSESSMENT AND PLAN:  This is a very pleasant 79 years old white male with chronic lymphocytic leukemia currently on observation. The patient felt a mass in the left posterior neck area over the last few weeks but started getting smaller in size. The patient had further increase in his total white blood count close to 100,000.  He also started having worsening anemia likely secondary to the underlying CLL.  His platelets count are still normal. The patient is started systemic chemotherapy with bendamustine and Rituxan status post 3 cycles.  The patient has a rough time tolerating this treatment and it was discontinued. He was recently admitted to the hospital and diagnosed with congestive heart failure and currently on Entresto. He has persistent cough since starting this treatment. I recommended for the patient  to hold his treatment with [facility] for now and to call his cardiologist for an appointment as soon as possible for discussion of his treatment options for the congestive heart failure. CBC today is unremarkable except for mild anemia. I recommended for the patient to continue on observation with repeat CBC, comprehensive metabolic panel and LDH in 3 months. He was advised to call immediately if he has any other concerning symptoms in the interval. The patient voices understanding of current disease status and treatment options and is in agreement with the current care plan. All questions were answered. The patient knows to call the clinic with any problems, questions or concerns. We can certainly see the patient much sooner if necessary.  Disclaimer: This note was dictated with voice recognition software. Similar  sounding words can inadvertently be transcribed and may not be corrected upon review.

## 2020-08-01 ENCOUNTER — Encounter: Payer: Self-pay | Admitting: *Deleted

## 2020-08-01 ENCOUNTER — Other Ambulatory Visit: Payer: Medicare HMO | Admitting: Adult Health Nurse Practitioner

## 2020-08-01 ENCOUNTER — Other Ambulatory Visit: Payer: Medicare HMO

## 2020-08-01 DIAGNOSIS — C9110 Chronic lymphocytic leukemia of B-cell type not having achieved remission: Secondary | ICD-10-CM

## 2020-08-01 DIAGNOSIS — Z515 Encounter for palliative care: Secondary | ICD-10-CM

## 2020-08-01 NOTE — Progress Notes (Signed)
[facility] Consult Note Telephone: [phone]  Fax: [phone]  PATIENT NAME: Tony Marshall DOB: 01/18/1941 MRN: [id number]  PRIMARY CARE PROVIDER:   Timoteo Gaul, FNP  REFERRING PROVIDER:  Timoteo Gaul, [place],  [place] [address]  RESPONSIBLE PARTY:   Valente Fosberg, wife  H:  [phone]  C:  [phone]    RECOMMENDATIONS and PLAN:  1.  Advanced care planning.  Patient was listed as DNR from last hospital visit.  Wife states that he made that decision after being told about hospice which made him feel like he had no hope left.  Did discuss complications of having CPR with wife.  Have encouraged that they discuss this as a family.  Also left MOST form for them to go over as a family.  2.  Functional status.  4 months ago patient was walking without assistive devices, mowing the lawn, able to perform ADLs independently.  After last hospitalization he is wheelchair-bound and requires assistance with ADLs.  Is able to feed himself.  Will be receiving home health services through [facility] to include PT/OT.  Continue PT/OT as ordered  3.  Nutritional status.  In April 2021 patient weighed 152 pounds with BMI of 23.46.  Patient's appetite has been decreasing and current weight is 116 with a BMI of 18.16.  Family states that when he eats he just eats bites.  Used to be able to supplement with 2 ensures a day and now they are lucky to get half an Ensure in him.  Has been started on Marinol 2.5 mg twice daily.  Family does state that they have noticed a slight increase in his appetite but not much.  Have recommended possibly increasing the Marinol to 5 mg twice daily.  Have reached out to Dr. Lew Dawes office with this recommendation.  4.  Cough.  Patient has had persistent cough since hospitalization at end of June and early July.  Patient was diagnosed with CHF at this time and started on Entresto.  Family  states that the cough can be pretty severe and he will cough for up to an hour.  The cough starts out as a dry cough but he will cough up sputum.  Family states that Dr. Julien Nordmann believes this may be a reaction to his heart failure medications.  And the cough does seem to have started close after starting the [facility].  Patient has appointment with cardiology on 08/10/2020.  They have reached out to the cardiology office to get this moved up to see if medications is what causing his cough and what recommendations cardiology has.  Daughter does state that they did not give him the Entresto last night and he did not cough as much.  This morning he is coughing.  They have tried [employer], Tussionex, guaifenesin with codeine with no relief of the cough.  PCP did start him on hydrocodone/APAP 5/325 mg every 6 hours as needed to see if this will help with his cough.  Family does state that he slept best last night than he has in a long time.  Continue with follow-up and recommendations by cardiology  5.  Pain.  Family does state that he does complain of back pain since lying in bed more often.  Has tramadol 50 mg every 12 hours as needed for pain.  Family does state that he gets relief with this.  Does not have to take this every day.  Continue current pain regimen.  Patient very fragile.  Now that patient is no longer on any chemo and leukemia stable at this point.  Family wants to focus on increasing his appetite and seeing if physical therapy can help regain some of his strength.  Palliative will continue to monitor for symptom management/decline and make recommendations as needed.  Next visit is in 4 weeks.  Family encouraged to call with any questions or concerns.  I spent 120 minutes providing this consultation,  from 8:00 to 10:00 including time spent with patient/family, chart review, provider coordination, documentation. More than 50% of the time in this consultation was spent coordinating  communication.   HISTORY OF PRESENT ILLNESS:  Tony Marshall is a 79 y.o. year old male with multiple medical problems including CLL, PCM, CHF with EF 25-30%, sleep apnea, a-fib, DMT2, HTN, HLD, Schatzki ring. Palliative Care was asked to help address goals of care.  Patient had hospitalization 6/21-06/20/2020 for sepsis related to right leg cellulitis.  Was also found to have congestive heart failure with EF of 25% and was started on Entresto.  Patient again hospitalized 7/14 through 07/11/2020 for GI bleed related to impaction.  Was also found to have DKA and pneumonia.  Also found to have DVT of right arm; not on anticoagulation due to high GI bleeding risk.  8/4 through 07/21/2020 patient was hospitalized for persistent cough. Patient diagnosed with CLL about 6 years ago.  Did undergo 3 rounds of chemo earlier this year.  This was stopped due to side effects.  Patient did have appointment with Dr. Earlie Server, oncologist, yesterday and and their understanding is that the cancer is in remission.  We will continue to have surveillance and next appointment is in 3 months.  Denies chest pain, fever, nausea, vomiting, headaches, dysuria, hematuria, hematochezia.  CODE STATUS: see above  PPS: 30% HOSPICE ELIGIBILITY/DIAGNOSIS: TBD  PHYSICAL EXAM:  HR 74  O2 98% on RA General: NAD, frail appearing, thin Cardiovascular: regular rate and rhythm Pulmonary: clear ant fields; normal respiratory effort Abdomen: soft, nontender, + bowel sounds GU: no suprapubic tenderness Extremities: no edema, no joint deformities Skin: no rashes on exposed skin Neurological: Weakness but otherwise nonfocal;  Does have periods of confusion and forgetfulness   PAST MEDICAL HISTORY:  Past Medical History:  Diagnosis Date  . A-fib ([place])   . CLL (chronic lymphocytic leukemia) ([place]) dx'd 2014   progression 12/2019  . Diabetes mellitus without complication ([place])   . Diverticulosis   . External hemorrhoids   . GERD  (gastroesophageal reflux disease)   . Hiatal hernia   . History of colon polyps   . Hyperlipidemia   . Hypertension   . Internal hemorrhoids   . Lymphocytosis   . Schatzki's ring   . Sleep apnea     SOCIAL HX:  Social History   Tobacco Use  . Smoking status: Never Smoker  . Smokeless tobacco: Former Systems developer    Types: Chew  Substance Use Topics  . Alcohol use: Yes    Alcohol/week: 6.0 standard drinks    Types: 6 Cans of beer per week    ALLERGIES:  Allergies  Allergen Reactions  . Glycopyrrolate Rash     PERTINENT MEDICATIONS:  Outpatient Encounter Medications as of 08/01/2020  Medication Sig  . acetaminophen (TYLENOL) 500 MG tablet Take 500 mg by mouth every 6 (six) hours as needed for moderate pain.  Marland Kitchen allopurinol (ZYLOPRIM) 100 MG tablet Take 1 tablet (100 mg total) by mouth 2 (  two) times daily.  Marland Kitchen ALPRAZolam (XANAX) 0.25 MG tablet Take 1 tablet (0.25 mg total) by mouth at bedtime as needed for anxiety.  Marland Kitchen amiodarone (PACERONE) 200 MG tablet Take 1 tablet (200 mg total) by mouth daily.  . benzonatate (TESSALON PERLES) 100 MG capsule Take 100 mg by mouth 3 (three) times daily as needed for cough.  . carvedilol (COREG) 12.5 MG tablet Take 1 tablet (12.5 mg total) by mouth 2 (two) times daily with a meal.  . chlorpheniramine-HYDROcodone (TUSSIONEX) 10-8 MG/5ML SUER Take 5 mLs by mouth every 12 (twelve) hours.  Marland Kitchen dronabinol (MARINOL) 2.5 MG capsule TAKE 1 CAPSULE BY MOUTH TWICE DAILY BEFORE A MEAL (Patient taking differently: Take 2.5 mg by mouth 2 (two) times daily before lunch and supper. )  . esomeprazole (NEXIUM) 40 MG capsule TAKE 1 CAPSULE BY MOUTH EVERY MORNING 30 MINUTES BEFORE BREAKFAST (Patient taking differently: Take 40 mg by mouth daily. )  . feeding supplement, ENSURE ENLIVE, (ENSURE ENLIVE) LIQD Take 237 mLs by mouth 3 (three) times daily between meals.  Marland Kitchen glucagon (GLUCAGON EMERGENCY) 1 MG injection Inject 1 mg into the skin once as needed (For severe low blood  sugar).  Marland Kitchen guaiFENesin (MUCINEX) 600 MG 12 hr tablet Take 1 tablet (600 mg total) by mouth 2 (two) times daily.  Marland Kitchen guaiFENesin-codeine 100-10 MG/5ML syrup Take 5 mLs by mouth every 6 (six) hours as needed for cough.  . insulin aspart (NOVOLOG FLEXPEN) 100 UNIT/ML FlexPen Inject 3-5 Units into the skin 3 (three) times daily with meals. Depending on the blood sugar level.  . insulin degludec (TRESIBA FLEXTOUCH) 100 UNIT/ML FlexTouch Pen INJECT 12 UNITS DAILY. IF FASTING BLOOD SUGARS ARE OVER 140 INCREASE DOSAGE UP TO 15 UNITS DAILY AS DIRECTED.  Marland Kitchen ONETOUCH VERIO test strip USE 1 STRIP TO CHECK GLUCOSE THREE TIMES DAILY  . oxyCODONE (OXY IR/ROXICODONE) 5 MG immediate release tablet Take 1 tablet (5 mg total) by mouth every 4 (four) hours as needed for severe pain.  Marland Kitchen oxymetazoline (AFRIN) 0.05 % nasal spray Place 1 spray into both nostrils 2 (two) times daily as needed for congestion.  . pioglitazone (ACTOS) 15 MG tablet Take 15 mg by mouth daily.  . polyethylene glycol (MIRALAX / GLYCOLAX) 17 g packet Take 17 g by mouth daily as needed for severe constipation. hold if having multiple loose stools per day  . potassium chloride SA (KLOR-CON) 20 MEQ tablet Take 1 tablet (20 mEq total) by mouth daily.  . rosuvastatin (CRESTOR) 5 MG tablet TAKE 1 TABLET BY MOUTH EVERY DAY (Patient taking differently: Take 5 mg by mouth daily. )  . sacubitril-valsartan (ENTRESTO) 24-26 MG Take 2 tablets by mouth 2 (two) times daily.  Marland Kitchen senna (SENOKOT) 8.6 MG TABS tablet Take 1 tablet (8.6 mg total) by mouth at bedtime as needed for moderate constipation.  . traMADol (ULTRAM) 50 MG tablet Take 1 tablet (50 mg total) by mouth every 12 (twelve) hours as needed.   No facility-administered encounter medications on file as of 08/01/2020.     Aliz Meritt Jenetta Downer, NP

## 2020-08-01 NOTE — Progress Notes (Signed)
[facility] Psychosocial Distress Screening Clinical Social Work  Clinical Social Work was referred by distress screening protocol.  The patient scored a 7 on the Psychosocial Distress Thermometer which indicates moderate distress.  Patients distress was associated with physical problems as indicated on the screen below.  Patient has been referred to palliative care.  Please contact CSW is social work needs are identified.     ONCBCN DISTRESS SCREENING 07/28/2020  Screening Type Initial Screening  Distress experienced in past week (1-10) 7  Physical Problem type Getting around;Sleep/insomnia;Nausea/vomiting;Breathing;Loss of appetitie  Physician notified of physical symptoms Yes  Referral to clinical psychology No  Referral to clinical social work No  Referral to dietition No  Referral to financial advocate No  Referral to support programs No  Referral to palliative care Yes    Johnnye Lana, MSW, LCSW, OSW-C Clinical Social Worker [place] [phone]

## 2020-08-04 ENCOUNTER — Other Ambulatory Visit: Payer: Self-pay | Admitting: Physician Assistant

## 2020-08-04 ENCOUNTER — Encounter: Payer: Self-pay | Admitting: Internal Medicine

## 2020-08-04 ENCOUNTER — Telehealth: Payer: Self-pay | Admitting: Adult Health Nurse Practitioner

## 2020-08-04 DIAGNOSIS — E440 Moderate protein-calorie malnutrition: Secondary | ICD-10-CM | POA: Diagnosis not present

## 2020-08-04 DIAGNOSIS — K222 Esophageal obstruction: Secondary | ICD-10-CM | POA: Diagnosis not present

## 2020-08-04 DIAGNOSIS — R627 Adult failure to thrive: Secondary | ICD-10-CM | POA: Diagnosis not present

## 2020-08-04 DIAGNOSIS — C9110 Chronic lymphocytic leukemia of B-cell type not having achieved remission: Secondary | ICD-10-CM | POA: Diagnosis not present

## 2020-08-04 DIAGNOSIS — I110 Hypertensive heart disease with heart failure: Secondary | ICD-10-CM | POA: Diagnosis not present

## 2020-08-04 DIAGNOSIS — E860 Dehydration: Secondary | ICD-10-CM

## 2020-08-04 DIAGNOSIS — D630 Anemia in neoplastic disease: Secondary | ICD-10-CM | POA: Diagnosis not present

## 2020-08-04 DIAGNOSIS — I5042 Chronic combined systolic (congestive) and diastolic (congestive) heart failure: Secondary | ICD-10-CM | POA: Diagnosis not present

## 2020-08-04 DIAGNOSIS — K5790 Diverticulosis of intestine, part unspecified, without perforation or abscess without bleeding: Secondary | ICD-10-CM | POA: Diagnosis not present

## 2020-08-04 DIAGNOSIS — E119 Type 2 diabetes mellitus without complications: Secondary | ICD-10-CM | POA: Diagnosis not present

## 2020-08-04 DIAGNOSIS — G4730 Sleep apnea, unspecified: Secondary | ICD-10-CM | POA: Diagnosis not present

## 2020-08-04 NOTE — Telephone Encounter (Signed)
Spoke with pt daughter, the patients cough is getting worse, the patient is having trouble resting. He woke up from the nap he took this afternoon and is now hallucinating. She reports he is taking to someone when he is in the room by himself. His bp is 137/77 and pulse 82. He does not look mottled by her report and seems to be breathing fine. They have also called the cancer center to see if he can get fluids, they do make his symptoms better. He does ciugh up a greenish to yellow sputum. Will discuss with dr Oval Linsey. The daughter reports palliative care told them to call us that they can not help.

## 2020-08-04 NOTE — Telephone Encounter (Signed)
Spoke with pt wife, aware patient needs to discuss with the medical doctor. She reports usually if he gets fluids he does get better. She has a call into the cancer center for fluids.

## 2020-08-04 NOTE — Telephone Encounter (Signed)
Called pt & spoke with wife in response to My Chart message.  Reminded her not to leave urgent messages on MyChart & to call.  Discussed with Cassie H PA & orders for tomorrow for IVF added.  Informed to come in @ 10 am.  Verified with wife that they are checking blood glucose & she states they check very often & no real highs or lows & stable.  He is not eating & drinking very little. Encouraged to get pt to drink juice glass amt every 15 min & she states she will try.  He gets in maybe 1-2 ensure daily. Informed to take to urgent care/ED if symptoms persist or worsen. She expressed understanding.

## 2020-08-04 NOTE — Telephone Encounter (Signed)
Returned wife's message through email.  Patient displaying signs of dehydration.  Wife asking if should call cancer center or if palliative was able to give him fluids.  Explained that palliative was not able to do and to reach out to the cancer center. Dea Bitting K. Olena Heckle NP

## 2020-08-05 ENCOUNTER — Inpatient Hospital Stay: Payer: Medicare HMO

## 2020-08-05 ENCOUNTER — Other Ambulatory Visit: Payer: Self-pay

## 2020-08-05 VITALS — BP 102/64 | HR 82 | Temp 98.7°F | Resp 17

## 2020-08-05 DIAGNOSIS — D649 Anemia, unspecified: Secondary | ICD-10-CM | POA: Diagnosis not present

## 2020-08-05 DIAGNOSIS — R5383 Other fatigue: Secondary | ICD-10-CM | POA: Diagnosis not present

## 2020-08-05 DIAGNOSIS — R05 Cough: Secondary | ICD-10-CM | POA: Diagnosis not present

## 2020-08-05 DIAGNOSIS — R630 Anorexia: Secondary | ICD-10-CM | POA: Diagnosis not present

## 2020-08-05 DIAGNOSIS — R627 Adult failure to thrive: Secondary | ICD-10-CM | POA: Diagnosis not present

## 2020-08-05 DIAGNOSIS — E860 Dehydration: Secondary | ICD-10-CM

## 2020-08-05 DIAGNOSIS — Z993 Dependence on wheelchair: Secondary | ICD-10-CM | POA: Diagnosis not present

## 2020-08-05 DIAGNOSIS — R531 Weakness: Secondary | ICD-10-CM | POA: Diagnosis not present

## 2020-08-05 DIAGNOSIS — C9110 Chronic lymphocytic leukemia of B-cell type not having achieved remission: Secondary | ICD-10-CM | POA: Diagnosis not present

## 2020-08-05 MED ORDER — SODIUM CHLORIDE 0.9 % IV SOLN
Freq: Once | INTRAVENOUS | Status: AC
  Filled 2020-08-05: qty 250

## 2020-08-05 MED ORDER — SODIUM CHLORIDE 0.9 % IV SOLN
Freq: Once | INTRAVENOUS | Status: DC
  Filled 2020-08-05: qty 250

## 2020-08-05 NOTE — Patient Instructions (Signed)

## 2020-08-07 ENCOUNTER — Encounter: Payer: Self-pay | Admitting: Endocrinology

## 2020-08-07 ENCOUNTER — Ambulatory Visit: Payer: Medicare HMO | Admitting: Endocrinology

## 2020-08-07 ENCOUNTER — Other Ambulatory Visit: Payer: Self-pay

## 2020-08-07 VITALS — BP 100/60 | HR 82 | Ht 67.0 in

## 2020-08-07 DIAGNOSIS — E1165 Type 2 diabetes mellitus with hyperglycemia: Secondary | ICD-10-CM

## 2020-08-07 DIAGNOSIS — R059 Cough, unspecified: Secondary | ICD-10-CM

## 2020-08-07 DIAGNOSIS — Z794 Long term (current) use of insulin: Secondary | ICD-10-CM

## 2020-08-07 DIAGNOSIS — R05 Cough: Secondary | ICD-10-CM

## 2020-08-07 NOTE — Progress Notes (Signed)
Patient ID: Tony Marshall, male   DOB: 1941-08-15, 79 y.o.   MRN: [id number]   Reason for Appointment:     History of Present Illness     2.  Decreased appetite: He has minimal appetite and also refusing to take Glucerna.  He says that he has no taste and also his wife thinks that he has some difficulty eating or drinking Appears to have lost a lot of weight  3.  Low back pain: This has been persistent and not clear of the etiology.  He has been given narcotic pain medications for this No recent x-ray available   4.  Cellulitis of the right leg treated with Ancef and subsequently on cephalexin with improvement.  Redness is much less now.  He does have leukopenia secondary to his chemotherapy with his last white count 3.3  5.  Hypomagnesemia: This has been treated with IV infusions  6.. Type 2 DIABETES MELITUS, date of diagnosis:  1992        His blood sugar has been  difficult to control over the last few years because of variability in his blood sugars On basal bolus insulin for a few years and his oral hypoglycemic drugs have been continued Also did not tolerate GLP-1 drugs or symlin because of nausea He is essentially looking like a a type I patient with requiring relatively low amounts of insulin and twice a day dosage of basal insulin He has done somewhat better with Levemir compared to Lantus and is using this twice a day also  He also had overall much better controlled with adding Invokana in 07/2016  Recent history:  Insulin regimen: insulin TRESIBA 7-8 units am.  Mealtime insulin: 3-7 units  Oral hypoglycemic drugs: Pioglitazone 15 mg daily  His A1c is higher at 7.7  Recent blood sugar patterns and problems:  He has had significantly variable blood sugars at all times but generally higher later in the evening  Because of his decreased appetite his intake is quite variable but is getting 1 to 1-1/2 cans of Ensure a day  His wife is helping him  with his insulin but is taking his blood sugar at bedtime and then adjusting the [place] based on the reading every day  However recently most of his blood sugars in the mornings are significantly higher  Trying to take some NovoLog based on his blood sugars before meals  Overall blood sugars are averaging about 180 without any hypoglycemia    Hypoglycemia:  as above   Side effects from medications:  Diarrhea from metformin, weight loss from Invokana  Monitors blood glucose:  3-4 times a day  Glucometer: One Touch Verio.           Blood Glucose readings    PRE-MEAL Fasting Lunch Dinner Bedtime Overall  Glucose range:  92-229    147-269  92-285  Mean/median:  160  171  132  185  178   POST-MEAL PC Breakfast PC Lunch PC Dinner  Glucose range:    128-263  Mean/median:   173  190      Wt Readings from Last 3 Encounters:  07/19/20 115 lb 15.4 oz (52.6 kg)  07/08/20 122 lb 9.2 oz (55.6 kg)  06/17/20 141 lb 1.5 oz (64 kg)          LABS:  Lab Results  Component Value Date   HGBA1C 7.7 (H) 06/06/2020   HGBA1C 4.7 03/20/2020  HGBA1C 6.7 (H) 12/20/2019   Lab Results  Component Value Date   MICROALBUR 2.7 (H) 06/11/2019   LDLCALC 61 06/10/2020   CREATININE 0.69 07/31/2020     Hypertension:    His blood pressure has been high for over 30 years and previously difficult to control. He was evaluated in 1/08 with urine metanephrines and renal artery ultrasound.    Current medication regimen: Only Entresto since he had low blood pressure readings during hospitalization  BP Readings from Last 3 Encounters:  08/07/20 100/60  08/05/20 102/64  07/31/20 106/62      Other ACTIVE problems: See review of systems  LABS:  No visits with results within 1 Week(s) from this visit.  Latest known visit with results is:  Appointment on 07/31/2020  Component Date Value Ref Range Status  . LDH 07/31/2020 227* 98 - 192 U/L Final   Performed at [facility],  [place] [address] [place], [place] [address]  . Sodium 07/31/2020 135  135 - 145 mmol/L Final  . Potassium 07/31/2020 4.4  3.5 - 5.1 mmol/L Final  . Chloride 07/31/2020 102  98 - 111 mmol/L Final  . CO2 07/31/2020 25  22 - 32 mmol/L Final  . Glucose, Bld 07/31/2020 134* 70 - 99 mg/dL Final   Glucose reference range applies only to samples taken after fasting for at least 8 hours.  . BUN 07/31/2020 12  8 - 23 mg/dL Final  . Creatinine 07/31/2020 0.69  0.61 - 1.24 mg/dL Final  . Calcium 07/31/2020 9.2  8.9 - 10.3 mg/dL Final  . Total Protein 07/31/2020 5.1* 6.5 - 8.1 g/dL Final  . Albumin 07/31/2020 2.0* 3.5 - 5.0 g/dL Final  . AST 07/31/2020 14* 15 - 41 U/L Final  . ALT 07/31/2020 <6  0 - 44 U/L Final  . Alkaline Phosphatase 07/31/2020 86  38 - 126 U/L Final  . Total Bilirubin 07/31/2020 0.7  0.3 - 1.2 mg/dL Final  . GFR, Est Non Af Am 07/31/2020 >60  >60 mL/min Final  . GFR, Est AFR Am 07/31/2020 >60  >60 mL/min Final  . Anion gap 07/31/2020 8  5 - 15 Final   Performed at [facility], [place] [address], [place], [place] [address]  . WBC Count 07/31/2020 4.5  4.0 - 10.5 K/uL Final  . RBC 07/31/2020 3.00* 4.22 - 5.81 MIL/uL Final  . Hemoglobin 07/31/2020 9.0* 13.0 - 17.0 g/dL Final  . HCT 07/31/2020 27.7* 39 - 52 % Final  . MCV 07/31/2020 92.3  80.0 - 100.0 fL Final  . MCH 07/31/2020 30.0  26.0 - 34.0 pg Final  . MCHC 07/31/2020 32.5  30.0 - 36.0 g/dL Final  . RDW 07/31/2020 17.8* 11.5 - 15.5 % Final  . Platelet Count 07/31/2020 345  150 - 400 K/uL Final  . nRBC 07/31/2020 0.0  0.0 - 0.2 % Final  . Neutrophils Relative % 07/31/2020 63  % Final  . Neutro Abs 07/31/2020 2.9  1.7 - 7.7 K/uL Final  . Lymphocytes Relative 07/31/2020 24  % Final  . Lymphs Abs 07/31/2020 1.1  0.7 - 4.0 K/uL Final  . Monocytes Relative 07/31/2020 6  % Final  . Monocytes Absolute 07/31/2020 0.3  0 - 1 K/uL Final  . Eosinophils Relative 07/31/2020 3  % Final  . Eosinophils Absolute  07/31/2020 0.1  0 - 0 K/uL Final  . Basophils Relative 07/31/2020 1  % Final  . Basophils Absolute 07/31/2020 0.0  0 -  0 K/uL Final  . Immature Granulocytes 07/31/2020 3  % Final  . Abs Immature Granulocytes 07/31/2020 0.13* 0.00 - 0.07 K/uL Final  . Polychromasia 07/31/2020 PRESENT   Final  . Ovalocytes 07/31/2020 PRESENT   Final   Performed at [facility], [place] [address], [place], [place] [address]    Allergies as of 08/07/2020      Reactions   Glycopyrrolate Rash      Medication List       Accurate as of August 07, 2020 11:59 PM. If you have any questions, ask your nurse or doctor.        acetaminophen 500 MG tablet Commonly known as: TYLENOL Take 500 mg by mouth every 6 (six) hours as needed for moderate pain.   allopurinol 100 MG tablet Commonly known as: Zyloprim Take 1 tablet (100 mg total) by mouth 2 (two) times daily.   ALPRAZolam 0.25 MG tablet Commonly known as: XANAX Take 1 tablet (0.25 mg total) by mouth at bedtime as needed for anxiety.   amiodarone 200 MG tablet Commonly known as: PACERONE Take 1 tablet (200 mg total) by mouth daily.   carvedilol 12.5 MG tablet Commonly known as: COREG Take 1 tablet (12.5 mg total) by mouth 2 (two) times daily with a meal.   chlorpheniramine-HYDROcodone 10-8 MG/5ML Suer Commonly known as: TUSSIONEX Take 5 mLs by mouth every 12 (twelve) hours.   dronabinol 2.5 MG capsule Commonly known as: MARINOL TAKE 1 CAPSULE BY MOUTH TWICE DAILY BEFORE A MEAL What changed: See the new instructions.   esomeprazole 40 MG capsule Commonly known as: NEXIUM TAKE 1 CAPSULE BY MOUTH EVERY MORNING 30 MINUTES BEFORE BREAKFAST What changed: See the new instructions.   feeding supplement (ENSURE ENLIVE) Liqd Take 237 mLs by mouth 3 (three) times daily between meals.   Glucagon Emergency 1 MG Kit Inject 1 mg into the skin once as needed (For severe low blood sugar).   guaiFENesin 600 MG 12 hr  tablet Commonly known as: MUCINEX Take 1 tablet (600 mg total) by mouth 2 (two) times daily.   guaiFENesin-codeine 100-10 MG/5ML syrup Take 5 mLs by mouth every 6 (six) hours as needed for cough.   HYDROcodone-acetaminophen 5-325 MG tablet Commonly known as: NORCO/VICODIN Take 1 tablet by mouth every 6 (six) hours as needed for moderate pain.   NovoLOG FlexPen 100 UNIT/ML FlexPen Generic drug: insulin aspart Inject 3-5 Units into the skin 3 (three) times daily with meals. Depending on the blood sugar level.   OneTouch Verio test strip Generic drug: glucose blood USE 1 STRIP TO CHECK GLUCOSE THREE TIMES DAILY   oxyCODONE 5 MG immediate release tablet Commonly known as: Oxy IR/ROXICODONE Take 1 tablet (5 mg total) by mouth every 4 (four) hours as needed for severe pain.   oxymetazoline 0.05 % nasal spray Commonly known as: AFRIN Place 1 spray into both nostrils 2 (two) times daily as needed for congestion.   pioglitazone 15 MG tablet Commonly known as: ACTOS Take 15 mg by mouth daily.   polyethylene glycol 17 g packet Commonly known as: MIRALAX / GLYCOLAX Take 17 g by mouth daily as needed for severe constipation. hold if having multiple loose stools per day   potassium chloride SA 20 MEQ tablet Commonly known as: KLOR-CON Take 1 tablet (20 mEq total) by mouth daily.   rosuvastatin 5 MG tablet Commonly known as: CRESTOR TAKE 1 TABLET BY MOUTH EVERY DAY   sacubitril-valsartan 24-26 MG Commonly known as: ENTRESTO  Take 2 tablets by mouth 2 (two) times daily.   senna 8.6 MG Tabs tablet Commonly known as: SENOKOT Take 1 tablet (8.6 mg total) by mouth at bedtime as needed for moderate constipation.   Tessalon Perles 100 MG capsule Generic drug: benzonatate Take 100 mg by mouth 3 (three) times daily as needed for cough.   traMADol 50 MG tablet Commonly known as: ULTRAM Take 1 tablet (50 mg total) by mouth every 12 (twelve) hours as needed.   Tyler Aas FlexTouch 100  UNIT/ML FlexTouch Pen Generic drug: insulin degludec INJECT 12 UNITS DAILY. IF FASTING BLOOD SUGARS ARE OVER 140 INCREASE DOSAGE UP TO 15 UNITS DAILY AS DIRECTED.       Allergies:  Allergies  Allergen Reactions  . Glycopyrrolate Rash    Past Medical History:  Diagnosis Date  . A-fib ([place])   . CLL (chronic lymphocytic leukemia) ([place]) dx'd 2014   progression 12/2019  . Diabetes mellitus without complication ([place])   . Diverticulosis   . External hemorrhoids   . GERD (gastroesophageal reflux disease)   . Hiatal hernia   . History of colon polyps   . Hyperlipidemia   . Hypertension   . Internal hemorrhoids   . Lymphocytosis   . Schatzki's ring   . Sleep apnea     Past Surgical History:  Procedure Laterality Date  . BLEPHAROPLASTY    . COLONOSCOPY  2012  . FLEXIBLE SIGMOIDOSCOPY N/A 07/05/2020   Procedure: FLEXIBLE SIGMOIDOSCOPY;  Surgeon: Thornton Park, MD;  Location: [facility];  Service: Gastroenterology;  Laterality: N/A;  . HEMORRHOID SURGERY     Sclerotherapy  . KNEE SURGERY Right 2002  . SHOULDER SURGERY Right 2007    Family History  Problem Relation Age of Onset  . Kidney disease Mother   . CAD Maternal Aunt   . Colon cancer Neg Hx   . Esophageal cancer Neg Hx   . Rectal cancer Neg Hx   . Stomach cancer Neg Hx     Social History:  reports that he has never smoked. He quit smokeless tobacco use about 7 years ago.  His smokeless tobacco use included chew. He reports current alcohol use of about 6.0 standard drinks of alcohol per week. He reports that he does not use drugs.  Review of Systems:  Cough: He appears to have a persistent mostly dry cough with some mucoid sputum and has been given hydrocodone and Tessalon for this No significant shortness of breath  ANEMIA: Followed by oncologist  Lab Results  Component Value Date   VITAMINB12 675 06/06/2020   Lab Results  Component Value Date   HGB 9.0 (L) 07/31/2020     HYPERLIPIDEMIA: The lipid  abnormality consists of elevated LDL which is well controlled with 5 mg Crestor   Lab Results  Component Value Date   CHOL 98 06/10/2020   HDL 5 (L) 06/10/2020   LDLCALC 61 06/10/2020   TRIG 160 (H) 06/10/2020   CHOLHDL 19.6 06/10/2020     ROS  CLL: Followed by oncologist   Lab Results  Component Value Date   WBC 4.5 07/31/2020        Objective:    Physical Exam  BP 100/60 (BP Location: Left Arm, Patient Position: Sitting, Cuff Size: Normal)   Pulse 82   Ht 5' 7"  (1.702 m)   SpO2 91%   BMI 18.16 kg/m    He is alert and answers questions, still looking somewhat emaciated  Lungs are clear with only occasional rhonchi  ASSESSMENT/ PLAN:   Diabetes type 2, insulin-dependent and nonobese  A1c was last 7.7  Despite his poor intake he still appears to be having significant hypoglycemia at times her blood sugars as high as 285 Likely has insulin deficiency Currently adjusting Tyler Aas based on only bedtime reading  Recommended the following today  Check blood sugars before each meal and bedtime  Go up to 9 units [place] and start doing this in the morning based on blood sugars in the mornings every 2 to 3 days  May take 3 to 4 units for each can of Ensure  Try to do the NovoLog right after finishing eating or drinking   Dry cough: This is likely to be from pulmonary causes and he will discuss with PCP, is going to be seen soon Will likely need another chest x-ray  Patient Instructions  Take 9 units in am daily Adjust based on am sugars  Novolog as before but just after drinking Ensure and food      Elayne Snare 08/08/2020, 9:02 PM

## 2020-08-07 NOTE — Patient Instructions (Signed)
Take 9 units in am daily Adjust based on am sugars  Novolog as before but just after drinking Ensure and food

## 2020-08-09 DIAGNOSIS — R627 Adult failure to thrive: Secondary | ICD-10-CM | POA: Diagnosis not present

## 2020-08-09 DIAGNOSIS — G4730 Sleep apnea, unspecified: Secondary | ICD-10-CM | POA: Diagnosis not present

## 2020-08-09 DIAGNOSIS — E440 Moderate protein-calorie malnutrition: Secondary | ICD-10-CM | POA: Diagnosis not present

## 2020-08-09 DIAGNOSIS — I5042 Chronic combined systolic (congestive) and diastolic (congestive) heart failure: Secondary | ICD-10-CM | POA: Diagnosis not present

## 2020-08-09 DIAGNOSIS — K5790 Diverticulosis of intestine, part unspecified, without perforation or abscess without bleeding: Secondary | ICD-10-CM | POA: Diagnosis not present

## 2020-08-09 DIAGNOSIS — E119 Type 2 diabetes mellitus without complications: Secondary | ICD-10-CM | POA: Diagnosis not present

## 2020-08-09 DIAGNOSIS — C9110 Chronic lymphocytic leukemia of B-cell type not having achieved remission: Secondary | ICD-10-CM | POA: Diagnosis not present

## 2020-08-09 DIAGNOSIS — K222 Esophageal obstruction: Secondary | ICD-10-CM | POA: Diagnosis not present

## 2020-08-09 DIAGNOSIS — I110 Hypertensive heart disease with heart failure: Secondary | ICD-10-CM | POA: Diagnosis not present

## 2020-08-09 DIAGNOSIS — D630 Anemia in neoplastic disease: Secondary | ICD-10-CM | POA: Diagnosis not present

## 2020-08-09 NOTE — Progress Notes (Addendum)
Cardiology Clinic Note   Patient Name: Tony Marshall Date of Encounter: 08/10/2020  Primary Care Provider:  Timoteo Gaul, FNP Primary Cardiologist:  Skeet Latch, MD  Patient Profile    Tony Marshall. Sole 79 year old male presents today for follow-up evaluation of his essential hypertension, chronic combined systolic and diastolic heart failure, and atrial fibrillation.  Past Medical History    Past Medical History:  Diagnosis Date  . A-fib ([place])   . CLL (chronic lymphocytic leukemia) ([place]) dx'd 2014   progression 12/2019  . Diabetes mellitus without complication ([place])   . Diverticulosis   . External hemorrhoids   . GERD (gastroesophageal reflux disease)   . Hiatal hernia   . History of colon polyps   . Hyperlipidemia   . Hypertension   . Internal hemorrhoids   . Lymphocytosis   . Schatzki's ring   . Sleep apnea    Past Surgical History:  Procedure Laterality Date  . BLEPHAROPLASTY    . COLONOSCOPY  2012  . FLEXIBLE SIGMOIDOSCOPY N/A 07/05/2020   Procedure: FLEXIBLE SIGMOIDOSCOPY;  Surgeon: Thornton Park, MD;  Location: [facility];  Service: Gastroenterology;  Laterality: N/A;  . HEMORRHOID SURGERY     Sclerotherapy  . KNEE SURGERY Right 2002  . SHOULDER SURGERY Right 2007    Allergies  Allergies  Allergen Reactions  . Glycopyrrolate Rash    History of Present Illness    Tony Marshall has a past medical history of CLL, essential hypertension, prior habitual alcohol use, cardiomyopathy in the context of acute illness, acute combined systolic and diastolic heart failure, cardiogenic shock, atrial fibrillation, acute respiratory failure, GERD, lower GI bleed, diabetes mellitus, DKA, pure hypercholesterolemia, hiatal hernia, altered mental status, symptomatic anemia, failure to thrive, prolonged QT interval, and is a DNR.  Due to his CLL, initiation of chemotherapy, dehydration, weight loss, constipation, subsequent diarrhea after laxatives he  was admitted 06/05/2020 with confusion, weakness, and fatigue felt to be related to septic shock in the setting of RLL cellulitis.  He was noted to be hyponatremic, hypokalemic, febrile, and metabolic acidosis with anion gap and lactic acidosis, AKI decreased TSH, toxic metabolic encephalopathy, prolonged QT, and chemo induced neutropenia.  His echocardiogram showed severe LV dysfunction with an EF of 25-30%, G2 DD, and normal RV.  Cardiology was consulted for reevaluation as an outpatient of his right/left heart cath when more medically stable.  He was then readmitted on 06/28/2020 with diarrhea and maroon-colored stools with clots which was found to be GIB and DKA.  It was felt that a possible fecal impaction with stercoral  ulcer/hemorrhoids were the result of his GI bleed.  His hospital course was complicated by extreme agitation, delirium or patient was combative and hitting staff members.  Cardiology followed for new onset of atrial fibrillation with RVR which was treated with amiodarone.  Not a candidate for oral anticoagulation due to GI bleeding.  Also right subclavian DVT which was felt to be related to his PICC line that was in place from his previous hospitalization.  He presents to the clinic today for follow-up evaluation and states he has had a productive cough for 2-3 weeks.  Oncology physician felt it may be related to his Entresto medication.  [name] has been stopped for 1-1.5 weeks and cough is not resolved.  Now having a productive yellow-tinged cough.  Lungs rhonchorous to auscultation throughout all lung fields.  I will order chest x-ray and have him follow-up with PCP in the next few days, as soon  as possible.  With resolution of cough I will trial Entresto.  Follow-up in 1 month.  Today he denies chest pain, increased shortness of breath, lower extremity edema, fatigue, palpitations, melena, hematuria, hemoptysis, diaphoresis, weakness, presyncope, syncope, orthopnea, and PND.   Home  Medications    Prior to Admission medications   Medication Sig Start Date End Date Taking? Authorizing Provider  acetaminophen (TYLENOL) 500 MG tablet Take 500 mg by mouth every 6 (six) hours as needed for moderate pain.    [provider]  allopurinol (ZYLOPRIM) 100 MG tablet Take 1 tablet (100 mg total) by mouth 2 (two) times daily. 03/28/20   Curt Bears, MD  ALPRAZolam Duanne Moron) 0.25 MG tablet Take 1 tablet (0.25 mg total) by mouth at bedtime as needed for anxiety. 06/21/20   Curt Bears, MD  amiodarone (PACERONE) 200 MG tablet Take 1 tablet (200 mg total) by mouth daily. 07/09/20   Patrecia Pour, MD  benzonatate (TESSALON PERLES) 100 MG capsule Take 100 mg by mouth 3 (three) times daily as needed for cough.    [provider]  carvedilol (COREG) 12.5 MG tablet Take 1 tablet (12.5 mg total) by mouth 2 (two) times daily with a meal. 06/20/20 07/20/20  Antonieta Pert, MD  chlorpheniramine-HYDROcodone (TUSSIONEX) 10-8 MG/5ML SUER Take 5 mLs by mouth every 12 (twelve) hours. 07/21/20   Florencia Reasons, MD  dronabinol (MARINOL) 2.5 MG capsule TAKE 1 CAPSULE BY MOUTH TWICE DAILY BEFORE A MEAL Patient taking differently: Take 2.5 mg by mouth 2 (two) times daily before lunch and supper.  07/15/20   Heilingoetter, Cassandra L, PA-C  esomeprazole (NEXIUM) 40 MG capsule TAKE 1 CAPSULE BY MOUTH EVERY MORNING 30 MINUTES BEFORE BREAKFAST Patient taking differently: Take 40 mg by mouth daily.  04/28/20   Pyrtle, Lajuan Lines, MD  feeding supplement, ENSURE ENLIVE, (ENSURE ENLIVE) LIQD Take 237 mLs by mouth 3 (three) times daily between meals. 07/08/20   Patrecia Pour, MD  glucagon (GLUCAGON EMERGENCY) 1 MG injection Inject 1 mg into the skin once as needed (For severe low blood sugar). 06/14/19   Elayne Snare, MD  guaiFENesin (MUCINEX) 600 MG 12 hr tablet Take 1 tablet (600 mg total) by mouth 2 (two) times daily. 07/21/20   Florencia Reasons, MD  guaiFENesin-codeine 100-10 MG/5ML syrup Take 5 mLs by mouth every 6 (six) hours  as needed for cough. 07/28/20   Tanner, Lyndon Code., PA-C  HYDROcodone-acetaminophen (NORCO/VICODIN) 5-325 MG tablet Take 1 tablet by mouth every 6 (six) hours as needed for moderate pain.    [provider]  insulin aspart (NOVOLOG FLEXPEN) 100 UNIT/ML FlexPen Inject 3-5 Units into the skin 3 (three) times daily with meals. Depending on the blood sugar level.    [provider]  insulin degludec (TRESIBA FLEXTOUCH) 100 UNIT/ML FlexTouch Pen INJECT 12 UNITS DAILY. IF FASTING BLOOD SUGARS ARE OVER 140 INCREASE DOSAGE UP TO 15 UNITS DAILY AS DIRECTED. 07/17/20   Elayne Snare, MD  [facility] VERIO test strip USE 1 STRIP TO CHECK GLUCOSE THREE TIMES DAILY 02/24/20   Elayne Snare, MD  oxyCODONE (OXY IR/ROXICODONE) 5 MG immediate release tablet Take 1 tablet (5 mg total) by mouth every 4 (four) hours as needed for severe pain. 07/21/20   Florencia Reasons, MD  oxymetazoline (AFRIN) 0.05 % nasal spray Place 1 spray into both nostrils 2 (two) times daily as needed for congestion.    [provider]  pioglitazone (ACTOS) 15 MG tablet Take 15 mg by mouth daily. 06/26/20  [provider]  polyethylene glycol (MIRALAX / GLYCOLAX) 17 g packet Take 17 g by mouth daily as needed for severe constipation. hold if having multiple loose stools per day 07/21/20   Florencia Reasons, MD  potassium chloride SA (KLOR-CON) 20 MEQ tablet Take 1 tablet (20 mEq total) by mouth daily. 07/21/20   Florencia Reasons, MD  rosuvastatin (CRESTOR) 5 MG tablet TAKE 1 TABLET BY MOUTH EVERY DAY Patient taking differently: Take 5 mg by mouth daily.  07/14/20   Elayne Snare, MD  senna (SENOKOT) 8.6 MG TABS tablet Take 1 tablet (8.6 mg total) by mouth at bedtime as needed for moderate constipation. 07/21/20   Florencia Reasons, MD  traMADol (ULTRAM) 50 MG tablet Take 1 tablet (50 mg total) by mouth every 12 (twelve) hours as needed. 07/21/20   Florencia Reasons, MD    Family History    Family History  Problem Relation Age of Onset  . Kidney disease Mother   . CAD  Maternal Aunt   . Colon cancer Neg Hx   . Esophageal cancer Neg Hx   . Rectal cancer Neg Hx   . Stomach cancer Neg Hx    He indicated that his mother is deceased. He indicated that his father is deceased. He indicated that the status of his maternal aunt is unknown. He indicated that the status of his neg hx is unknown.  Social History    Social History   Socioeconomic History  . Marital status: Married    Spouse name: Not on file  . Number of children: 1  . Years of education: Not on file  . Highest education level: Not on file  Occupational History  . Occupation: Retail buyer: [employer]  Tobacco Use  . Smoking status: Never Smoker  . Smokeless tobacco: Former Systems developer    Types: Secondary school teacher  . Vaping Use: Never used  Substance and Sexual Activity  . Alcohol use: Yes    Alcohol/week: 6.0 standard drinks    Types: 6 Cans of beer per week  . Drug use: No  . Sexual activity: Yes  Other Topics Concern  . Not on file  Social History Narrative  . Not on file   Social Determinants of Health   Financial Resource Strain:   . Difficulty of Paying Living Expenses: Not on file  Food Insecurity:   . Worried About Charity fundraiser in the Last Year: Not on file  . Ran Out of Food in the Last Year: Not on file  Transportation Needs:   . Lack of Transportation (Medical): Not on file  . Lack of Transportation (Non-Medical): Not on file  Physical Activity:   . Days of Exercise per Week: Not on file  . Minutes of Exercise per Session: Not on file  Stress:   . Feeling of Stress : Not on file  Social Connections:   . Frequency of Communication with Friends and Family: Not on file  . Frequency of Social Gatherings with Friends and Family: Not on file  . Attends Religious Services: Not on file  . Active Member of Clubs or Organizations: Not on file  . Attends Archivist Meetings: Not on file  . Marital Status: Not on file  Intimate  Partner Violence:   . Fear of Current or Ex-Partner: Not on file  . Emotionally Abused: Not on file  . Physically Abused: Not on file  . Sexually Abused: Not on file  Review of Systems    General:  No chills, fever, night sweats or weight changes.  Cardiovascular:  No chest pain, dyspnea on exertion, edema, orthopnea, palpitations, paroxysmal nocturnal dyspnea. Dermatological: No rash, lesions/masses Respiratory: No cough, dyspnea Urologic: No hematuria, dysuria Abdominal:   No nausea, vomiting, diarrhea, bright red blood per rectum, melena, or hematemesis Neurologic:  No visual changes, wkns, changes in mental status. All other systems reviewed and are otherwise negative except as noted above.  Physical Exam    VS:  BP 113/67   Pulse 71  , BMI There is no height or weight on file to calculate BMI. GEN: Well nourished, well developed, in no acute distress. HEENT: normal. Neck: Supple, no JVD, carotid bruits, or masses. Cardiac: RRR, no murmurs, rubs, or gallops. No clubbing, cyanosis, edema.  Radials/DP/PT 2+ and equal bilaterally.  Respiratory:  Respirations regular and unlabored, rhonchorous to auscultation bilaterally upper and lower lobes. GI: Soft, nontender, nondistended, BS + x 4. MS: no deformity or atrophy. Skin: warm and dry, no rash. Neuro:  Strength and sensation are intact. Psych: Normal affect.  Accessory Clinical Findings    Recent Labs: 07/03/2020: TSH 3.521 07/19/2020: B Natriuretic Peptide 250.2 07/21/2020: Magnesium 1.6 07/31/2020: ALT <6; BUN 12; Creatinine 0.69; Hemoglobin 9.0; Platelet Count 345; Potassium 4.4; Sodium 135   Recent Lipid Panel    Component Value Date/Time   CHOL 98 06/10/2020 0432   TRIG 160 (H) 06/10/2020 0432   HDL 5 (L) 06/10/2020 0432   CHOLHDL 19.6 06/10/2020 0432   VLDL 32 06/10/2020 0432   LDLCALC 61 06/10/2020 0432    ECG personally reviewed by me today-normal sinus rhythm low voltage QRS 71 bpm- No acute changes  EKG  07/19/2020 Sinus rhythm 67 bpm no ST or T wave deviation  Echocardiogram 06/07/2020 IMPRESSIONS    1. Left ventricular ejection fraction, by estimation, is 25 to 30%. The  left ventricle has severely decreased function. The left ventricle has no  regional wall motion abnormalities. Left ventricular diastolic parameters  are consistent with Grade II  diastolic dysfunction (pseudonormalization).  2. Right ventricular systolic function is normal. The right ventricular  size is normal. There is normal pulmonary artery systolic pressure.  3. The mitral valve is normal in structure. No evidence of mitral valve  regurgitation. No evidence of mitral stenosis.  4. The aortic valve is grossly normal. Aortic valve regurgitation is not  visualized. No aortic stenosis is present.  5. The inferior vena cava is normal in size with greater than 50%  respiratory variability, suggesting right atrial pressure of 3 mmHg.  Assessment & Plan   1.  Atrial fibrillation with RVR-EKG today shows normal sinus rhythm 71 bpm.  New onset in the presence of metabolic stress related to DKA, hypokalemia, GI bleeding.  During admission he was placed on IV amiodarone at conversion to normal sinus rhythm.  CHA2DS2-VASc score 6.  Not a candidate for anticoagulation due to GIB.  Avoid CCB's given LV dysfunction. Continue amiodarone, carvedilol Heart healthy low-sodium diet-salty 6 given Increase physical activity as tolerated  Chronic combined CHF/cardiomyopathy-slowly increasing physical activity.  Underwent right/left heart cath showing severe LV dysfunction with an EF of 25-30% Continue carvedilol, Entresto Heart healthy low-sodium diet-salty 6 given Increase physical activity as tolerated Repeat echocardiogram when medications and optimize for 1 month. Order cxr-cough x 3 weeks   Cough-x3 weeks.  Now with productive yellow retention.  Oncology felt it may be related to [facility] medication which was stopped.  Due  to color of sputum and nature of cough feel this is infectious in nature. Order CXR, continue flutter valve, Tessalon Perles, Follow-up with PCP in the next few days-1 week-may need antibiotics Resume guaifenesin  Essential hypertension-BP today 113/67.  Well-controlled at home. Continue carvedilol Heart healthy low-sodium diet-salty 6 given Increase physical activity as tolerated We will try to reinitiate Entresto after recovers from acute URI/pneumonia  Prolonged QTC-measured at 480-496 ms during admission.  No void QT prolonging agents.  Stable on amiodarone.  EKG today shows Continue to monitor  Disposition: Follow-up with me or Dr. Oval Linsey in 63 month   Jossie Ng. Leeba Barbe NP-C    08/10/2020, 11:53 AM [facility] [address] Office [phone] mg twice daily fax [phone]  Notice: This dictation was prepared with Dragon dictation along with smaller phrase technology. Any transcriptional errors that result from this process are unintentional and may not be corrected upon review.

## 2020-08-10 ENCOUNTER — Encounter: Payer: Self-pay | Admitting: General Practice

## 2020-08-10 ENCOUNTER — Ambulatory Visit: Payer: Medicare HMO | Admitting: General Practice

## 2020-08-10 ENCOUNTER — Ambulatory Visit
Admission: RE | Admit: 2020-08-10 | Discharge: 2020-08-10 | Disposition: A | Discharge status: Home / Self Care | Payer: Medicare HMO | Source: Ambulatory Visit | Attending: Cardiovascular Disease | Admitting: Cardiovascular Disease

## 2020-08-10 ENCOUNTER — Other Ambulatory Visit: Payer: Self-pay

## 2020-08-10 VITALS — BP 113/67 | HR 71

## 2020-08-10 DIAGNOSIS — I501 Left ventricular failure: Secondary | ICD-10-CM | POA: Diagnosis not present

## 2020-08-10 DIAGNOSIS — R2681 Unsteadiness on feet: Secondary | ICD-10-CM | POA: Diagnosis not present

## 2020-08-10 DIAGNOSIS — R058 Other specified cough: Secondary | ICD-10-CM

## 2020-08-10 DIAGNOSIS — I4891 Unspecified atrial fibrillation: Secondary | ICD-10-CM

## 2020-08-10 DIAGNOSIS — R05 Cough: Secondary | ICD-10-CM | POA: Diagnosis not present

## 2020-08-10 DIAGNOSIS — R9431 Abnormal electrocardiogram [ECG] [EKG]: Secondary | ICD-10-CM | POA: Diagnosis not present

## 2020-08-10 DIAGNOSIS — M6281 Muscle weakness (generalized): Secondary | ICD-10-CM | POA: Diagnosis not present

## 2020-08-10 DIAGNOSIS — D649 Anemia, unspecified: Secondary | ICD-10-CM | POA: Diagnosis not present

## 2020-08-10 DIAGNOSIS — I10 Essential (primary) hypertension: Secondary | ICD-10-CM | POA: Diagnosis not present

## 2020-08-10 DIAGNOSIS — I509 Heart failure, unspecified: Secondary | ICD-10-CM | POA: Diagnosis not present

## 2020-08-10 DIAGNOSIS — G4733 Obstructive sleep apnea (adult) (pediatric): Secondary | ICD-10-CM | POA: Diagnosis not present

## 2020-08-10 DIAGNOSIS — R0602 Shortness of breath: Secondary | ICD-10-CM | POA: Diagnosis not present

## 2020-08-10 DIAGNOSIS — I5042 Chronic combined systolic (congestive) and diastolic (congestive) heart failure: Secondary | ICD-10-CM | POA: Diagnosis not present

## 2020-08-10 DIAGNOSIS — I5041 Acute combined systolic (congestive) and diastolic (congestive) heart failure: Secondary | ICD-10-CM | POA: Diagnosis not present

## 2020-08-10 DIAGNOSIS — J189 Pneumonia, unspecified organism: Secondary | ICD-10-CM | POA: Diagnosis not present

## 2020-08-10 DIAGNOSIS — R627 Adult failure to thrive: Secondary | ICD-10-CM | POA: Diagnosis not present

## 2020-08-10 DIAGNOSIS — R2689 Other abnormalities of gait and mobility: Secondary | ICD-10-CM | POA: Diagnosis not present

## 2020-08-10 DIAGNOSIS — C9110 Chronic lymphocytic leukemia of B-cell type not having achieved remission: Secondary | ICD-10-CM | POA: Diagnosis not present

## 2020-08-10 NOTE — Patient Instructions (Addendum)
Medication Instructions:  CONTINUE TO HOLD ENTRESTO  OK TO TAKE MUCINEX 600MG  TWICE DAILY *If you need a refill on your cardiac medications before your next appointment, please call your pharmacy*  Special Instructions GO TO [facility] ASAP AND HAVE CHEST XRAY-THEY DO THIS ON A WALK-IN BASIS  INCREASE HYDRATION  PLEASE FOLLOW UP WITH PRIMARY MD ASAP, WITHIN A WEEK  Follow-Up: Your next appointment:  1 month(s)  In Person with Skeet Latch, MD -[place], FNP-C  At [facility], you and your health needs are our priority.  As part of our continuing mission to provide you with exceptional heart care, we have created designated Provider Care Teams.  These Care Teams include your primary Cardiologist (physician) and Advanced Practice Providers (APPs -  Physician Assistants and Nurse Practitioners) who all work together to provide you with the care you need, when you need it.

## 2020-08-11 ENCOUNTER — Telehealth: Payer: Self-pay | Admitting: General Practice

## 2020-08-11 DIAGNOSIS — R627 Adult failure to thrive: Secondary | ICD-10-CM | POA: Diagnosis not present

## 2020-08-11 DIAGNOSIS — K5790 Diverticulosis of intestine, part unspecified, without perforation or abscess without bleeding: Secondary | ICD-10-CM | POA: Diagnosis not present

## 2020-08-11 DIAGNOSIS — K222 Esophageal obstruction: Secondary | ICD-10-CM | POA: Diagnosis not present

## 2020-08-11 DIAGNOSIS — E440 Moderate protein-calorie malnutrition: Secondary | ICD-10-CM | POA: Diagnosis not present

## 2020-08-11 DIAGNOSIS — I110 Hypertensive heart disease with heart failure: Secondary | ICD-10-CM | POA: Diagnosis not present

## 2020-08-11 DIAGNOSIS — E119 Type 2 diabetes mellitus without complications: Secondary | ICD-10-CM | POA: Diagnosis not present

## 2020-08-11 DIAGNOSIS — C9110 Chronic lymphocytic leukemia of B-cell type not having achieved remission: Secondary | ICD-10-CM | POA: Diagnosis not present

## 2020-08-11 DIAGNOSIS — D630 Anemia in neoplastic disease: Secondary | ICD-10-CM | POA: Diagnosis not present

## 2020-08-11 DIAGNOSIS — G4730 Sleep apnea, unspecified: Secondary | ICD-10-CM | POA: Diagnosis not present

## 2020-08-11 DIAGNOSIS — I5042 Chronic combined systolic (congestive) and diastolic (congestive) heart failure: Secondary | ICD-10-CM | POA: Diagnosis not present

## 2020-08-11 MED ORDER — MOXIFLOXACIN HCL 400 MG PO TABS: 400.0000 mg | ORAL_TABLET | Freq: Every day | ORAL | 0 refills | Status: AC

## 2020-08-11 NOTE — Telephone Encounter (Signed)
Tony Marshall 79 year old male presented to the clinic on 08/10/2020.  I ordered chest x-ray due to productive cough and bilateral rhonchorous lung sounds throughout all lung fields.  Chest x-ray showed  Airspace consolidation and volume loss in the left lower lobe concerning for pneumonia. Small left pleural effusion. Small bilateral pleural effusions. No evidence of pulmonary edema. No pneumothorax. No suspicious appearing pulmonary nodules or masses are noted. Heart size is normal. Mitral annular calcifications. Upper mediastinal contours are within normal limits. Aortic atherosclerosis.  IMPRESSION: 1. Combination of airspace consolidation and atelectasis in the left lower lobe indicative of pneumonia. 2. Small bilateral pleural effusions. 3. Aortic atherosclerosis.  We will order moxifloxacin 40 mg daily x10 days.  Please have him follow-up with PCP in the next few days.  Thank you.

## 2020-08-11 NOTE — Addendum Note (Signed)
Addended by: Waylan Rocher on: 08/11/2020 08:56 AM   Modules accepted: Orders

## 2020-08-11 NOTE — Telephone Encounter (Signed)
Pt wife (DPR)informed of providers result & recommendations. Pt verbalized understanding. No further questions . She states that she will call PCP and see if he needs appt. New rx sent to requested pharmacy.

## 2020-08-13 DIAGNOSIS — R69 Illness, unspecified: Secondary | ICD-10-CM | POA: Diagnosis not present

## 2020-08-14 DIAGNOSIS — D630 Anemia in neoplastic disease: Secondary | ICD-10-CM | POA: Diagnosis not present

## 2020-08-14 DIAGNOSIS — R627 Adult failure to thrive: Secondary | ICD-10-CM | POA: Diagnosis not present

## 2020-08-14 DIAGNOSIS — G4730 Sleep apnea, unspecified: Secondary | ICD-10-CM | POA: Diagnosis not present

## 2020-08-14 DIAGNOSIS — K5790 Diverticulosis of intestine, part unspecified, without perforation or abscess without bleeding: Secondary | ICD-10-CM | POA: Diagnosis not present

## 2020-08-14 DIAGNOSIS — K222 Esophageal obstruction: Secondary | ICD-10-CM | POA: Diagnosis not present

## 2020-08-14 DIAGNOSIS — E119 Type 2 diabetes mellitus without complications: Secondary | ICD-10-CM | POA: Diagnosis not present

## 2020-08-14 DIAGNOSIS — C9110 Chronic lymphocytic leukemia of B-cell type not having achieved remission: Secondary | ICD-10-CM | POA: Diagnosis not present

## 2020-08-14 DIAGNOSIS — I110 Hypertensive heart disease with heart failure: Secondary | ICD-10-CM | POA: Diagnosis not present

## 2020-08-14 DIAGNOSIS — I5042 Chronic combined systolic (congestive) and diastolic (congestive) heart failure: Secondary | ICD-10-CM | POA: Diagnosis not present

## 2020-08-14 DIAGNOSIS — E440 Moderate protein-calorie malnutrition: Secondary | ICD-10-CM | POA: Diagnosis not present

## 2020-08-15 DIAGNOSIS — K222 Esophageal obstruction: Secondary | ICD-10-CM | POA: Diagnosis not present

## 2020-08-15 DIAGNOSIS — R627 Adult failure to thrive: Secondary | ICD-10-CM | POA: Diagnosis not present

## 2020-08-15 DIAGNOSIS — I110 Hypertensive heart disease with heart failure: Secondary | ICD-10-CM | POA: Diagnosis not present

## 2020-08-15 DIAGNOSIS — C9110 Chronic lymphocytic leukemia of B-cell type not having achieved remission: Secondary | ICD-10-CM | POA: Diagnosis not present

## 2020-08-15 DIAGNOSIS — G4730 Sleep apnea, unspecified: Secondary | ICD-10-CM | POA: Diagnosis not present

## 2020-08-15 DIAGNOSIS — D630 Anemia in neoplastic disease: Secondary | ICD-10-CM | POA: Diagnosis not present

## 2020-08-15 DIAGNOSIS — I5042 Chronic combined systolic (congestive) and diastolic (congestive) heart failure: Secondary | ICD-10-CM | POA: Diagnosis not present

## 2020-08-15 DIAGNOSIS — E119 Type 2 diabetes mellitus without complications: Secondary | ICD-10-CM | POA: Diagnosis not present

## 2020-08-15 DIAGNOSIS — K5790 Diverticulosis of intestine, part unspecified, without perforation or abscess without bleeding: Secondary | ICD-10-CM | POA: Diagnosis not present

## 2020-08-15 DIAGNOSIS — E440 Moderate protein-calorie malnutrition: Secondary | ICD-10-CM | POA: Diagnosis not present

## 2020-08-16 DIAGNOSIS — G4730 Sleep apnea, unspecified: Secondary | ICD-10-CM | POA: Diagnosis not present

## 2020-08-16 DIAGNOSIS — E440 Moderate protein-calorie malnutrition: Secondary | ICD-10-CM | POA: Diagnosis not present

## 2020-08-16 DIAGNOSIS — D630 Anemia in neoplastic disease: Secondary | ICD-10-CM | POA: Diagnosis not present

## 2020-08-16 DIAGNOSIS — I110 Hypertensive heart disease with heart failure: Secondary | ICD-10-CM | POA: Diagnosis not present

## 2020-08-16 DIAGNOSIS — R627 Adult failure to thrive: Secondary | ICD-10-CM | POA: Diagnosis not present

## 2020-08-16 DIAGNOSIS — E119 Type 2 diabetes mellitus without complications: Secondary | ICD-10-CM | POA: Diagnosis not present

## 2020-08-16 DIAGNOSIS — I5042 Chronic combined systolic (congestive) and diastolic (congestive) heart failure: Secondary | ICD-10-CM | POA: Diagnosis not present

## 2020-08-16 DIAGNOSIS — C9110 Chronic lymphocytic leukemia of B-cell type not having achieved remission: Secondary | ICD-10-CM | POA: Diagnosis not present

## 2020-08-16 DIAGNOSIS — K222 Esophageal obstruction: Secondary | ICD-10-CM | POA: Diagnosis not present

## 2020-08-16 DIAGNOSIS — K5790 Diverticulosis of intestine, part unspecified, without perforation or abscess without bleeding: Secondary | ICD-10-CM | POA: Diagnosis not present

## 2020-08-18 DIAGNOSIS — E440 Moderate protein-calorie malnutrition: Secondary | ICD-10-CM | POA: Diagnosis not present

## 2020-08-18 DIAGNOSIS — D630 Anemia in neoplastic disease: Secondary | ICD-10-CM | POA: Diagnosis not present

## 2020-08-18 DIAGNOSIS — K5790 Diverticulosis of intestine, part unspecified, without perforation or abscess without bleeding: Secondary | ICD-10-CM | POA: Diagnosis not present

## 2020-08-18 DIAGNOSIS — I5042 Chronic combined systolic (congestive) and diastolic (congestive) heart failure: Secondary | ICD-10-CM | POA: Diagnosis not present

## 2020-08-18 DIAGNOSIS — E119 Type 2 diabetes mellitus without complications: Secondary | ICD-10-CM | POA: Diagnosis not present

## 2020-08-18 DIAGNOSIS — K222 Esophageal obstruction: Secondary | ICD-10-CM | POA: Diagnosis not present

## 2020-08-18 DIAGNOSIS — R627 Adult failure to thrive: Secondary | ICD-10-CM | POA: Diagnosis not present

## 2020-08-18 DIAGNOSIS — C9110 Chronic lymphocytic leukemia of B-cell type not having achieved remission: Secondary | ICD-10-CM | POA: Diagnosis not present

## 2020-08-18 DIAGNOSIS — I110 Hypertensive heart disease with heart failure: Secondary | ICD-10-CM | POA: Diagnosis not present

## 2020-08-18 DIAGNOSIS — G4730 Sleep apnea, unspecified: Secondary | ICD-10-CM | POA: Diagnosis not present

## 2020-08-22 DIAGNOSIS — K222 Esophageal obstruction: Secondary | ICD-10-CM | POA: Diagnosis not present

## 2020-08-22 DIAGNOSIS — K5790 Diverticulosis of intestine, part unspecified, without perforation or abscess without bleeding: Secondary | ICD-10-CM | POA: Diagnosis not present

## 2020-08-22 DIAGNOSIS — R627 Adult failure to thrive: Secondary | ICD-10-CM | POA: Diagnosis not present

## 2020-08-22 DIAGNOSIS — D630 Anemia in neoplastic disease: Secondary | ICD-10-CM | POA: Diagnosis not present

## 2020-08-22 DIAGNOSIS — G4730 Sleep apnea, unspecified: Secondary | ICD-10-CM | POA: Diagnosis not present

## 2020-08-22 DIAGNOSIS — I110 Hypertensive heart disease with heart failure: Secondary | ICD-10-CM | POA: Diagnosis not present

## 2020-08-22 DIAGNOSIS — I5042 Chronic combined systolic (congestive) and diastolic (congestive) heart failure: Secondary | ICD-10-CM | POA: Diagnosis not present

## 2020-08-22 DIAGNOSIS — E440 Moderate protein-calorie malnutrition: Secondary | ICD-10-CM | POA: Diagnosis not present

## 2020-08-22 DIAGNOSIS — E119 Type 2 diabetes mellitus without complications: Secondary | ICD-10-CM | POA: Diagnosis not present

## 2020-08-22 DIAGNOSIS — C9110 Chronic lymphocytic leukemia of B-cell type not having achieved remission: Secondary | ICD-10-CM | POA: Diagnosis not present

## 2020-08-23 DIAGNOSIS — J189 Pneumonia, unspecified organism: Secondary | ICD-10-CM | POA: Diagnosis not present

## 2020-08-23 DIAGNOSIS — I509 Heart failure, unspecified: Secondary | ICD-10-CM | POA: Diagnosis not present

## 2020-08-23 DIAGNOSIS — R931 Abnormal findings on diagnostic imaging of heart and coronary circulation: Secondary | ICD-10-CM | POA: Diagnosis not present

## 2020-08-24 DIAGNOSIS — E119 Type 2 diabetes mellitus without complications: Secondary | ICD-10-CM | POA: Diagnosis not present

## 2020-08-24 DIAGNOSIS — E440 Moderate protein-calorie malnutrition: Secondary | ICD-10-CM | POA: Diagnosis not present

## 2020-08-24 DIAGNOSIS — I110 Hypertensive heart disease with heart failure: Secondary | ICD-10-CM | POA: Diagnosis not present

## 2020-08-24 DIAGNOSIS — K222 Esophageal obstruction: Secondary | ICD-10-CM | POA: Diagnosis not present

## 2020-08-24 DIAGNOSIS — R627 Adult failure to thrive: Secondary | ICD-10-CM | POA: Diagnosis not present

## 2020-08-24 DIAGNOSIS — K5790 Diverticulosis of intestine, part unspecified, without perforation or abscess without bleeding: Secondary | ICD-10-CM | POA: Diagnosis not present

## 2020-08-24 DIAGNOSIS — C9110 Chronic lymphocytic leukemia of B-cell type not having achieved remission: Secondary | ICD-10-CM | POA: Diagnosis not present

## 2020-08-24 DIAGNOSIS — G4730 Sleep apnea, unspecified: Secondary | ICD-10-CM | POA: Diagnosis not present

## 2020-08-24 DIAGNOSIS — I5042 Chronic combined systolic (congestive) and diastolic (congestive) heart failure: Secondary | ICD-10-CM | POA: Diagnosis not present

## 2020-08-24 DIAGNOSIS — D630 Anemia in neoplastic disease: Secondary | ICD-10-CM | POA: Diagnosis not present

## 2020-08-25 ENCOUNTER — Telehealth: Payer: Self-pay | Admitting: *Deleted

## 2020-08-25 ENCOUNTER — Other Ambulatory Visit: Payer: Medicare HMO | Admitting: Adult Health Nurse Practitioner

## 2020-08-25 ENCOUNTER — Other Ambulatory Visit: Payer: Self-pay

## 2020-08-25 ENCOUNTER — Other Ambulatory Visit: Payer: Self-pay | Admitting: *Deleted

## 2020-08-25 DIAGNOSIS — E119 Type 2 diabetes mellitus without complications: Secondary | ICD-10-CM | POA: Diagnosis not present

## 2020-08-25 DIAGNOSIS — C9110 Chronic lymphocytic leukemia of B-cell type not having achieved remission: Secondary | ICD-10-CM | POA: Diagnosis not present

## 2020-08-25 DIAGNOSIS — E860 Dehydration: Secondary | ICD-10-CM

## 2020-08-25 DIAGNOSIS — K5790 Diverticulosis of intestine, part unspecified, without perforation or abscess without bleeding: Secondary | ICD-10-CM | POA: Diagnosis not present

## 2020-08-25 DIAGNOSIS — I110 Hypertensive heart disease with heart failure: Secondary | ICD-10-CM | POA: Diagnosis not present

## 2020-08-25 DIAGNOSIS — K222 Esophageal obstruction: Secondary | ICD-10-CM | POA: Diagnosis not present

## 2020-08-25 DIAGNOSIS — R627 Adult failure to thrive: Secondary | ICD-10-CM | POA: Diagnosis not present

## 2020-08-25 DIAGNOSIS — I5042 Chronic combined systolic (congestive) and diastolic (congestive) heart failure: Secondary | ICD-10-CM | POA: Diagnosis not present

## 2020-08-25 DIAGNOSIS — D630 Anemia in neoplastic disease: Secondary | ICD-10-CM | POA: Diagnosis not present

## 2020-08-25 DIAGNOSIS — E440 Moderate protein-calorie malnutrition: Secondary | ICD-10-CM | POA: Diagnosis not present

## 2020-08-25 DIAGNOSIS — G4730 Sleep apnea, unspecified: Secondary | ICD-10-CM | POA: Diagnosis not present

## 2020-08-25 MED ORDER — SODIUM CHLORIDE 0.9 % IV SOLN
Freq: Once | INTRAVENOUS | Status: DC
  Filled 2020-08-25: qty 250

## 2020-08-25 NOTE — Telephone Encounter (Signed)
OK to give him IVF but his main issues are not related to his CLL. It is probably his uncontrolled DM. He may need to reach out to his PCP and Endocrinologist.

## 2020-08-25 NOTE — Telephone Encounter (Signed)
Called pt's wife, Enid Derry & offered Monday 08/28/20 for IVF @ 3 pm but suggested that she reach out to PCP/Endocrinologist also.  She states that she has been discussing with PCP & skilled care nurses.

## 2020-08-25 NOTE — Telephone Encounter (Signed)
Received vm call from pt's wife, Enid Derry statin that she thinks pt needs fluids.  Returned call this am & spoke with daughter who says that pt is not drinking enough & [facility] nurse says that he is a little dry.  She doesn't have a way to get him here today since her mother is at the [facility] getting treatment today.  She is asking if pt can come Sat or Monday for fluids.  Message to Dr Julien Nordmann.

## 2020-08-25 NOTE — Telephone Encounter (Signed)
Received call from [facility] RN/[facility] asking for appt with provider while he is here Monday for IVF.  She states he is between PCP's & present PCP is unavailable today.  His endocrinologist will only handle diabetes issues.  She reports that daughter says pt was not having appetite issues prior to chemo.  Discussed with Sandi Mealy PA & OK to add to his schedule on Monday.  Message to schedulers.

## 2020-08-26 ENCOUNTER — Encounter (HOSPITAL_COMMUNITY): Payer: Self-pay

## 2020-08-26 ENCOUNTER — Inpatient Hospital Stay (HOSPITAL_COMMUNITY)
Admission: EM | Admit: 2020-08-26 | Discharge: 2020-09-15 | DRG: 871 | Disposition: E | Payer: Medicare HMO | Attending: Family Medicine | Admitting: Family Medicine

## 2020-08-26 ENCOUNTER — Other Ambulatory Visit: Payer: Self-pay

## 2020-08-26 ENCOUNTER — Emergency Department (HOSPITAL_COMMUNITY): Payer: Medicare HMO

## 2020-08-26 DIAGNOSIS — R451 Restlessness and agitation: Secondary | ICD-10-CM | POA: Diagnosis not present

## 2020-08-26 DIAGNOSIS — E785 Hyperlipidemia, unspecified: Secondary | ICD-10-CM | POA: Diagnosis present

## 2020-08-26 DIAGNOSIS — J690 Pneumonitis due to inhalation of food and vomit: Secondary | ICD-10-CM | POA: Diagnosis not present

## 2020-08-26 DIAGNOSIS — J90 Pleural effusion, not elsewhere classified: Secondary | ICD-10-CM | POA: Diagnosis present

## 2020-08-26 DIAGNOSIS — Z4659 Encounter for fitting and adjustment of other gastrointestinal appliance and device: Secondary | ICD-10-CM

## 2020-08-26 DIAGNOSIS — Z681 Body mass index (BMI) 19 or less, adult: Secondary | ICD-10-CM

## 2020-08-26 DIAGNOSIS — I959 Hypotension, unspecified: Secondary | ICD-10-CM | POA: Diagnosis not present

## 2020-08-26 DIAGNOSIS — K644 Residual hemorrhoidal skin tags: Secondary | ICD-10-CM | POA: Diagnosis present

## 2020-08-26 DIAGNOSIS — R627 Adult failure to thrive: Secondary | ICD-10-CM | POA: Diagnosis present

## 2020-08-26 DIAGNOSIS — R579 Shock, unspecified: Secondary | ICD-10-CM | POA: Diagnosis present

## 2020-08-26 DIAGNOSIS — I700 Atherosclerosis of aorta: Secondary | ICD-10-CM | POA: Diagnosis present

## 2020-08-26 DIAGNOSIS — T829XXA Unspecified complication of cardiac and vascular prosthetic device, implant and graft, initial encounter: Secondary | ICD-10-CM

## 2020-08-26 DIAGNOSIS — Z8249 Family history of ischemic heart disease and other diseases of the circulatory system: Secondary | ICD-10-CM

## 2020-08-26 DIAGNOSIS — L89152 Pressure ulcer of sacral region, stage 2: Secondary | ICD-10-CM | POA: Diagnosis present

## 2020-08-26 DIAGNOSIS — R0602 Shortness of breath: Secondary | ICD-10-CM

## 2020-08-26 DIAGNOSIS — F419 Anxiety disorder, unspecified: Secondary | ICD-10-CM | POA: Diagnosis not present

## 2020-08-26 DIAGNOSIS — Z209 Contact with and (suspected) exposure to unspecified communicable disease: Secondary | ICD-10-CM | POA: Diagnosis not present

## 2020-08-26 DIAGNOSIS — T82898A Other specified complication of vascular prosthetic devices, implants and grafts, initial encounter: Secondary | ICD-10-CM | POA: Diagnosis not present

## 2020-08-26 DIAGNOSIS — R5081 Fever presenting with conditions classified elsewhere: Secondary | ICD-10-CM | POA: Diagnosis not present

## 2020-08-26 DIAGNOSIS — K5790 Diverticulosis of intestine, part unspecified, without perforation or abscess without bleeding: Secondary | ICD-10-CM | POA: Diagnosis present

## 2020-08-26 DIAGNOSIS — J189 Pneumonia, unspecified organism: Secondary | ICD-10-CM | POA: Diagnosis not present

## 2020-08-26 DIAGNOSIS — Z9689 Presence of other specified functional implants: Secondary | ICD-10-CM

## 2020-08-26 DIAGNOSIS — G4733 Obstructive sleep apnea (adult) (pediatric): Secondary | ICD-10-CM | POA: Diagnosis present

## 2020-08-26 DIAGNOSIS — Z20822 Contact with and (suspected) exposure to covid-19: Secondary | ICD-10-CM | POA: Diagnosis present

## 2020-08-26 DIAGNOSIS — E1165 Type 2 diabetes mellitus with hyperglycemia: Secondary | ICD-10-CM | POA: Diagnosis not present

## 2020-08-26 DIAGNOSIS — J9811 Atelectasis: Secondary | ICD-10-CM | POA: Diagnosis not present

## 2020-08-26 DIAGNOSIS — Z66 Do not resuscitate: Secondary | ICD-10-CM | POA: Diagnosis present

## 2020-08-26 DIAGNOSIS — A419 Sepsis, unspecified organism: Secondary | ICD-10-CM | POA: Diagnosis not present

## 2020-08-26 DIAGNOSIS — Z8719 Personal history of other diseases of the digestive system: Secondary | ICD-10-CM

## 2020-08-26 DIAGNOSIS — L8990 Pressure ulcer of unspecified site, unspecified stage: Secondary | ICD-10-CM | POA: Insufficient documentation

## 2020-08-26 DIAGNOSIS — Z9221 Personal history of antineoplastic chemotherapy: Secondary | ICD-10-CM

## 2020-08-26 DIAGNOSIS — N179 Acute kidney failure, unspecified: Secondary | ICD-10-CM

## 2020-08-26 DIAGNOSIS — R0902 Hypoxemia: Secondary | ICD-10-CM | POA: Diagnosis not present

## 2020-08-26 DIAGNOSIS — Y95 Nosocomial condition: Secondary | ICD-10-CM | POA: Diagnosis not present

## 2020-08-26 DIAGNOSIS — C9110 Chronic lymphocytic leukemia of B-cell type not having achieved remission: Secondary | ICD-10-CM | POA: Diagnosis present

## 2020-08-26 DIAGNOSIS — I4891 Unspecified atrial fibrillation: Secondary | ICD-10-CM | POA: Diagnosis present

## 2020-08-26 DIAGNOSIS — G9341 Metabolic encephalopathy: Secondary | ICD-10-CM

## 2020-08-26 DIAGNOSIS — J9382 Other air leak: Secondary | ICD-10-CM | POA: Diagnosis present

## 2020-08-26 DIAGNOSIS — E861 Hypovolemia: Secondary | ICD-10-CM | POA: Diagnosis present

## 2020-08-26 DIAGNOSIS — I110 Hypertensive heart disease with heart failure: Secondary | ICD-10-CM | POA: Diagnosis present

## 2020-08-26 DIAGNOSIS — E876 Hypokalemia: Secondary | ICD-10-CM

## 2020-08-26 DIAGNOSIS — J95811 Postprocedural pneumothorax: Secondary | ICD-10-CM | POA: Diagnosis not present

## 2020-08-26 DIAGNOSIS — Z993 Dependence on wheelchair: Secondary | ICD-10-CM

## 2020-08-26 DIAGNOSIS — R54 Age-related physical debility: Secondary | ICD-10-CM | POA: Diagnosis present

## 2020-08-26 DIAGNOSIS — F329 Major depressive disorder, single episode, unspecified: Secondary | ICD-10-CM | POA: Diagnosis present

## 2020-08-26 DIAGNOSIS — R05 Cough: Secondary | ICD-10-CM | POA: Diagnosis present

## 2020-08-26 DIAGNOSIS — I5041 Acute combined systolic (congestive) and diastolic (congestive) heart failure: Secondary | ICD-10-CM | POA: Diagnosis present

## 2020-08-26 DIAGNOSIS — J9383 Other pneumothorax: Secondary | ICD-10-CM

## 2020-08-26 DIAGNOSIS — R531 Weakness: Secondary | ICD-10-CM | POA: Diagnosis not present

## 2020-08-26 DIAGNOSIS — A4152 Sepsis due to Pseudomonas: Secondary | ICD-10-CM | POA: Diagnosis not present

## 2020-08-26 DIAGNOSIS — E11649 Type 2 diabetes mellitus with hypoglycemia without coma: Secondary | ICD-10-CM | POA: Diagnosis not present

## 2020-08-26 DIAGNOSIS — D7589 Other specified diseases of blood and blood-forming organs: Secondary | ICD-10-CM | POA: Diagnosis not present

## 2020-08-26 DIAGNOSIS — Z87891 Personal history of nicotine dependence: Secondary | ICD-10-CM

## 2020-08-26 DIAGNOSIS — Z79899 Other long term (current) drug therapy: Secondary | ICD-10-CM

## 2020-08-26 DIAGNOSIS — Y848 Other medical procedures as the cause of abnormal reaction of the patient, or of later complication, without mention of misadventure at the time of the procedure: Secondary | ICD-10-CM | POA: Diagnosis not present

## 2020-08-26 DIAGNOSIS — L89816 Pressure-induced deep tissue damage of head: Secondary | ICD-10-CM | POA: Diagnosis present

## 2020-08-26 DIAGNOSIS — M549 Dorsalgia, unspecified: Secondary | ICD-10-CM | POA: Diagnosis present

## 2020-08-26 DIAGNOSIS — Z794 Long term (current) use of insulin: Secondary | ICD-10-CM

## 2020-08-26 DIAGNOSIS — E871 Hypo-osmolality and hyponatremia: Secondary | ICD-10-CM | POA: Diagnosis not present

## 2020-08-26 DIAGNOSIS — J9690 Respiratory failure, unspecified, unspecified whether with hypoxia or hypercapnia: Secondary | ICD-10-CM

## 2020-08-26 DIAGNOSIS — R197 Diarrhea, unspecified: Secondary | ICD-10-CM | POA: Diagnosis not present

## 2020-08-26 DIAGNOSIS — K219 Gastro-esophageal reflux disease without esophagitis: Secondary | ICD-10-CM | POA: Diagnosis present

## 2020-08-26 DIAGNOSIS — Z4682 Encounter for fitting and adjustment of non-vascular catheter: Secondary | ICD-10-CM

## 2020-08-26 DIAGNOSIS — E43 Unspecified severe protein-calorie malnutrition: Secondary | ICD-10-CM

## 2020-08-26 DIAGNOSIS — K633 Ulcer of intestine: Secondary | ICD-10-CM | POA: Diagnosis present

## 2020-08-26 DIAGNOSIS — D709 Neutropenia, unspecified: Secondary | ICD-10-CM | POA: Diagnosis not present

## 2020-08-26 DIAGNOSIS — R000 Tachycardia, unspecified: Secondary | ICD-10-CM | POA: Diagnosis not present

## 2020-08-26 DIAGNOSIS — J9601 Acute respiratory failure with hypoxia: Secondary | ICD-10-CM | POA: Diagnosis not present

## 2020-08-26 DIAGNOSIS — E872 Acidosis: Secondary | ICD-10-CM | POA: Diagnosis present

## 2020-08-26 DIAGNOSIS — R6521 Severe sepsis with septic shock: Secondary | ICD-10-CM | POA: Diagnosis present

## 2020-08-26 DIAGNOSIS — D849 Immunodeficiency, unspecified: Secondary | ICD-10-CM | POA: Diagnosis present

## 2020-08-26 DIAGNOSIS — G8929 Other chronic pain: Secondary | ICD-10-CM | POA: Diagnosis present

## 2020-08-26 DIAGNOSIS — D649 Anemia, unspecified: Secondary | ICD-10-CM

## 2020-08-26 DIAGNOSIS — Z0189 Encounter for other specified special examinations: Secondary | ICD-10-CM

## 2020-08-26 DIAGNOSIS — J930 Spontaneous tension pneumothorax: Secondary | ICD-10-CM | POA: Diagnosis not present

## 2020-08-26 DIAGNOSIS — J939 Pneumothorax, unspecified: Secondary | ICD-10-CM

## 2020-08-26 DIAGNOSIS — F09 Unspecified mental disorder due to known physiological condition: Secondary | ICD-10-CM | POA: Diagnosis not present

## 2020-08-26 DIAGNOSIS — J9819 Other pulmonary collapse: Secondary | ICD-10-CM | POA: Diagnosis not present

## 2020-08-26 DIAGNOSIS — D61818 Other pancytopenia: Secondary | ICD-10-CM

## 2020-08-26 DIAGNOSIS — Z841 Family history of disorders of kidney and ureter: Secondary | ICD-10-CM

## 2020-08-26 DIAGNOSIS — I5043 Acute on chronic combined systolic (congestive) and diastolic (congestive) heart failure: Secondary | ICD-10-CM | POA: Diagnosis not present

## 2020-08-26 DIAGNOSIS — R34 Anuria and oliguria: Secondary | ICD-10-CM | POA: Diagnosis not present

## 2020-08-26 DIAGNOSIS — Z781 Physical restraint status: Secondary | ICD-10-CM

## 2020-08-26 DIAGNOSIS — Z515 Encounter for palliative care: Secondary | ICD-10-CM

## 2020-08-26 DIAGNOSIS — R578 Other shock: Secondary | ICD-10-CM | POA: Diagnosis not present

## 2020-08-26 DIAGNOSIS — J9600 Acute respiratory failure, unspecified whether with hypoxia or hypercapnia: Secondary | ICD-10-CM | POA: Diagnosis present

## 2020-08-26 LAB — CBC WITH DIFFERENTIAL/PLATELET
Abs Immature Granulocytes: 0.1 10*3/uL — ABNORMAL HIGH (ref 0.00–0.07)
Basophils Absolute: 0 10*3/uL (ref 0.0–0.1)
Basophils Relative: 0 %
Eosinophils Absolute: 0 10*3/uL (ref 0.0–0.5)
Eosinophils Relative: 0 %
HCT: 20 % — ABNORMAL LOW (ref 39.0–52.0)
Hemoglobin: 6.1 g/dL — CL (ref 13.0–17.0)
Immature Granulocytes: 3 %
Lymphocytes Relative: 47 %
Lymphs Abs: 1.6 10*3/uL (ref 0.7–4.0)
MCH: 31.8 pg (ref 26.0–34.0)
MCHC: 30.5 g/dL (ref 30.0–36.0)
MCV: 104.2 fL — ABNORMAL HIGH (ref 80.0–100.0)
Monocytes Absolute: 0.6 10*3/uL (ref 0.1–1.0)
Monocytes Relative: 17 %
Neutro Abs: 1.1 10*3/uL — ABNORMAL LOW (ref 1.7–7.7)
Neutrophils Relative %: 33 %
Platelets: 126 10*3/uL — ABNORMAL LOW (ref 150–400)
RBC: 1.92 MIL/uL — ABNORMAL LOW (ref 4.22–5.81)
RDW: 20 % — ABNORMAL HIGH (ref 11.5–15.5)
WBC: 3.3 10*3/uL — ABNORMAL LOW (ref 4.0–10.5)
nRBC: 0 % (ref 0.0–0.2)

## 2020-08-26 LAB — COMPREHENSIVE METABOLIC PANEL
ALT: 9 U/L (ref 0–44)
AST: 14 U/L — ABNORMAL LOW (ref 15–41)
Albumin: 1.9 g/dL — ABNORMAL LOW (ref 3.5–5.0)
Alkaline Phosphatase: 56 U/L (ref 38–126)
Anion gap: 7 (ref 5–15)
BUN: 29 mg/dL — ABNORMAL HIGH (ref 8–23)
CO2: 19 mmol/L — ABNORMAL LOW (ref 22–32)
Calcium: 8.8 mg/dL — ABNORMAL LOW (ref 8.9–10.3)
Chloride: 108 mmol/L (ref 98–111)
Creatinine, Ser: 1.54 mg/dL — ABNORMAL HIGH (ref 0.61–1.24)
GFR calc Af Amer: 50 mL/min — ABNORMAL LOW (ref >60)
GFR calc non Af Amer: 43 mL/min — ABNORMAL LOW (ref >60)
Glucose, Bld: 85 mg/dL (ref 70–99)
Potassium: 5.1 mmol/L (ref 3.5–5.1)
Sodium: 134 mmol/L — ABNORMAL LOW (ref 135–145)
Total Bilirubin: 0.2 mg/dL — ABNORMAL LOW (ref 0.3–1.2)
Total Protein: 4.5 g/dL — ABNORMAL LOW (ref 6.5–8.1)

## 2020-08-26 LAB — LACTIC ACID, PLASMA
Lactic Acid, Venous: 2.1 mmol/L (ref 0.5–1.9)
Lactic Acid, Venous: 3.2 mmol/L (ref 0.5–1.9)

## 2020-08-26 LAB — SARS CORONAVIRUS 2 BY RT PCR (HOSPITAL ORDER, PERFORMED IN ~~LOC~~ HOSPITAL LAB): SARS Coronavirus 2: NEGATIVE

## 2020-08-26 LAB — PREPARE RBC (CROSSMATCH)

## 2020-08-26 LAB — BRAIN NATRIURETIC PEPTIDE: B Natriuretic Peptide: 393.1 pg/mL — ABNORMAL HIGH (ref 0.0–100.0)

## 2020-08-26 LAB — MAGNESIUM: Magnesium: 1.6 mg/dL — ABNORMAL LOW (ref 1.7–2.4)

## 2020-08-26 MED ORDER — SODIUM CHLORIDE 0.9 % IV SOLN
10.0000 mL/h | Freq: Once | INTRAVENOUS | Status: AC
  Administered 2020-08-26: 10 mL/h via INTRAVENOUS

## 2020-08-26 MED ORDER — NOREPINEPHRINE 4 MG/250ML-% IV SOLN
0.0000 ug/min | INTRAVENOUS | Status: DC
  Administered 2020-08-26: 11 ug/min via INTRAVENOUS
  Administered 2020-08-27: 6 ug/min via INTRAVENOUS
  Administered 2020-08-27: 11 ug/min via INTRAVENOUS
  Administered 2020-08-28 (×2): 6 ug/min via INTRAVENOUS
  Administered 2020-08-29 – 2020-08-30 (×2): 2 ug/min via INTRAVENOUS
  Filled 2020-08-26 (×7): qty 250

## 2020-08-26 MED ORDER — PIPERACILLIN-TAZOBACTAM 3.375 G IVPB 30 MIN
3.3750 g | Freq: Once | INTRAVENOUS | Status: AC
  Administered 2020-08-26: 3.375 g via INTRAVENOUS
  Filled 2020-08-26: qty 50

## 2020-08-26 MED ORDER — VANCOMYCIN HCL IN DEXTROSE 1-5 GM/200ML-% IV SOLN
1000.0000 mg | Freq: Once | INTRAVENOUS | Status: AC
  Administered 2020-08-26: 1000 mg via INTRAVENOUS
  Filled 2020-08-26: qty 200

## 2020-08-26 MED ORDER — HYDROCOD POLST-CPM POLST ER 10-8 MG/5ML PO SUER
5.0000 mL | Freq: Two times a day (BID) | ORAL | Status: DC | PRN
  Administered 2020-08-26: 5 mL via ORAL
  Filled 2020-08-26: qty 5

## 2020-08-26 NOTE — H&P (Signed)
NAME:  Tony Marshall, MRN:  [id number], DOB:  [birth year], LOS: 0 ADMISSION DATE:  09/11/2020, CONSULTATION DATE:  08/25/2020 REFERRING MD:  EDP, CHIEF COMPLAINT:  lethargy  Brief History   79 year old man with history of HFrEF and CLL s/p three cycles chemo and recent outpatient antibiotics for PNA who presented with lethargy and poor po intake.  Found to have worsening bibasilar airspace disease and pleural effusions with hypotension despite 3.5L IVF. PCCM consulted for admission.  History of present illness   79 year old man with history of Atrial fibrillation not on anticoagulation, HFrEF, CLL s/p 3 cycles chemo, DM, HTN with recent PNA on 8/27 and has had 2 courses of antibiotics who was brought in for poor po intake and lethargy.  He is accompanied by his wife.  He has been feeling poorly for the last several weeks.  He feels some shortness of breath but denies chest pain.  Denies fevers or chills.  He has been coughing with mucus production that is green.  He does report sometimes choking on/coughing with food intake.  CXR significant with bilateral pleural effusions and worsenining bibasilar airspace disease.  He was placed on Bipap and given 3.5L IVF with Vanc and zosyn.  Labs were significant for WBC 3.3, Hgb 6.1 down from baseline of 9 several weeks ago, Lactic acid 3.2, Covid-19 negative.  Despite IVF he remained hypotensive, so vasopressors were initiated and PCCM consulted for admission  Past Medical History  Atrial fibrillation not on anticoagulation, HFrEF, CLL s/p 3 cycles chemo, DM, HTN, GERD, OSA   Significant Hospital Events   9/11 Admit to PCCM  Consults:  None  Procedures:  9/12 RIJ attempted, R pigtail chest tube by ED  Significant Diagnostic Tests:  9/11 CXR>> New moderate to large bilateral pleural effusions.  Worsening bibasilar airspace disease may represent consolidation or atelectasis.  Micro Data:  Sars-Cov-2>>negative Sputum culture Blood Cx 9/12> Urine  Cx 9/12>  Antimicrobials:  Vancomycin 9/11> Zosyn 9/11>9/11 Cefepime 9/12>  Interim history/subjective:  Feels a little better overall. Still has cough  Objective   Blood pressure (!) 95/48, pulse 86, temperature 97.9 F (36.6 C), temperature source Oral, resp. rate 19, SpO2 98 %.        Intake/Output Summary (Last 24 hours) at 08/24/2020 2332 Last data filed at 08/21/2020 2305 Gross per 24 hour  Intake 238.74 ml  Output --  Net 238.74 ml   General:  NAD HEENT: MM pink/moist Neuro: Awake and able to answer questions appropriately, follows commands, no focal deficits CV: RRR, no m/r/g PULM:  Crackles with decreased breath sounds towards bases GI: soft, nontender, nondistended  Extremities: warm/dry, 1+ BLE edema  Skin: no rashes or lesions  Assessment & Plan:  79 year old man with history of HFrEF and CLL s/p three cycles chemo and recent outpatient antibiotics for PNA who presented with lethargy and poor po intake.  Found to have worsening bibasilar airspace disease and pleural effusions with hypotension despite 3.5L IVF.   Pneumonia, possible aspiration pneumonia, septic shock:  --Continue antibiotics as vanc and cefepime --Obtain sputum culture if able (previously grew klebsiella on 7/22 resistant to amp/sulbactam) --Titrate levo gtt for MAP goal >65 --Possible UTI, will send for urine culture --Follow up Blood cultures --SLP eval  Pneumothorax: s/p chest tube placement on 9/12 --Chest tube to suction --Monitor CXR  HFrEF: O2 sat ok. He does have bilateral effusions --BiPAP prn --Can try to diurese when shock improves/resolves  AKI, metabolic acidosis without anion gap, lactic acidosis: Baseline  around 0.7 and on admission cr 1.54 --Monitor BMP --Trend LA  Acute on chronic anemia: Hgb initially 6.1. Now 8.3 on recheck. Thrombocytopenia improved and fibrinogen normal. --Monitor Hgb and transfuse prn for goal Hgb >7  Best practice:  Diet: NPO pending swallow  eval Pain/Anxiety/Delirium protocol (if indicated): Monitor VAP protocol (if indicated): N/A DVT prophylaxis: SCDs GI prophylaxis: N/A Glucose control: Monitor Mobility: PT when able Code Status: DNR but intubation prn Family Communication: Discussed with patient and his wife Disposition: ICU  Labs   CBC: Recent Labs  Lab 08/30/2020 1609  WBC 3.3*  NEUTROABS 1.1*  HGB 6.1*  HCT 20.0*  MCV 104.2*  PLT 126*    Basic Metabolic Panel: Recent Labs  Lab 08/17/2020 1958  NA 134*  K 5.1  CL 108  CO2 19*  GLUCOSE 85  BUN 29*  CREATININE 1.54*  CALCIUM 8.8*  MG 1.6*   GFR: CrCl cannot be calculated (Unknown ideal weight.). Recent Labs  Lab 08/19/2020 1609 08/20/2020 1633 08/25/2020 1958  WBC 3.3*  --   --   LATICACIDVEN  --  2.1* 3.2*    Liver Function Tests: Recent Labs  Lab 08/25/2020 1958  AST 14*  ALT 9  ALKPHOS 56  BILITOT 0.2*  PROT 4.5*  ALBUMIN 1.9*   No results for input(s): LIPASE, AMYLASE in the last 168 hours. No results for input(s): AMMONIA in the last 168 hours.  ABG    Component Value Date/Time   PHART 7.491 (H) 06/07/2020 0355   PCO2ART 27.9 (L) 06/07/2020 0355   PO2ART 308 (H) 06/07/2020 0355   HCO3 21.7 06/28/2020 1325   TCO2 25 11/29/2007 1303   ACIDBASEDEF 2.7 (H) 06/28/2020 1325   O2SAT 50.0 06/28/2020 1325     Coagulation Profile: No results for input(s): INR, PROTIME in the last 168 hours.  Cardiac Enzymes: No results for input(s): CKTOTAL, CKMB, CKMBINDEX, TROPONINI in the last 168 hours.  HbA1C: Hgb A1c MFr Bld  Date/Time Value Ref Range Status  06/06/2020 09:47 AM 7.7 (H) 4.8 - 5.6 % Final    Comment:    (NOTE)         Prediabetes: 5.7 - 6.4         Diabetes: >6.4         Glycemic control for adults with diabetes: <7.0   03/20/2020 02:03 PM 4.7 4.6 - 6.5 % Final    Comment:    Glycemic Control Guidelines for People with Diabetes:Non Diabetic:  <6%Goal of Therapy: <7%Additional Action Suggested:  >8%     CBG: No  results for input(s): GLUCAP in the last 168 hours.  Review of Systems:   10 point ROS obtained and negative except per HPI  Past Medical History  He,  has a past medical history of A-fib ([place]), CLL (chronic lymphocytic leukemia) ([place]) (dx'd 2014), Diabetes mellitus without complication ([place]), Diverticulosis, External hemorrhoids, GERD (gastroesophageal reflux disease), Hiatal hernia, History of colon polyps, Hyperlipidemia, Hypertension, Internal hemorrhoids, Lymphocytosis, Schatzki's ring, and Sleep apnea.   Surgical History    Past Surgical History:  Procedure Laterality Date  . BLEPHAROPLASTY    . COLONOSCOPY  2012  . FLEXIBLE SIGMOIDOSCOPY N/A 07/05/2020   Procedure: FLEXIBLE SIGMOIDOSCOPY;  Surgeon: Thornton Park, MD;  Location: [facility];  Service: Gastroenterology;  Laterality: N/A;  . HEMORRHOID SURGERY     Sclerotherapy  . KNEE SURGERY Right 2002  . SHOULDER SURGERY Right 2007     Social History   reports that he has never smoked. He  quit smokeless tobacco use about 7 years ago.  His smokeless tobacco use included chew. He reports current alcohol use of about 6.0 standard drinks of alcohol per week. He reports that he does not use drugs.   Family History   His family history includes CAD in his maternal aunt; Kidney disease in his mother. There is no history of Colon cancer, Esophageal cancer, Rectal cancer, or Stomach cancer.   Allergies Allergies  Allergen Reactions  . Glycopyrrolate Rash     Home Medications  Prior to Admission medications   Medication Sig Start Date End Date Taking? Authorizing Provider  acetaminophen (TYLENOL) 500 MG tablet Take 500 mg by mouth every 6 (six) hours as needed for moderate pain.   Yes [provider]  albuterol (VENTOLIN HFA) 108 (90 Base) MCG/ACT inhaler Inhale 2 puffs into the lungs every 4 (four) hours as needed for wheezing or shortness of breath.   Yes [provider]  allopurinol (ZYLOPRIM) 100 MG  tablet Take 1 tablet (100 mg total) by mouth 2 (two) times daily. 03/28/20  Yes Curt Bears, MD  ALPRAZolam Duanne Moron) 0.25 MG tablet Take 1 tablet (0.25 mg total) by mouth at bedtime as needed for anxiety. 06/21/20  Yes Curt Bears, MD  amiodarone (PACERONE) 200 MG tablet Take 1 tablet (200 mg total) by mouth daily. 07/09/20  Yes Patrecia Pour, MD  benzonatate (TESSALON PERLES) 100 MG capsule Take 100 mg by mouth 3 (three) times daily as needed for cough.   Yes [provider]  carvedilol (COREG) 12.5 MG tablet Take 1 tablet (12.5 mg total) by mouth 2 (two) times daily with a meal. 06/20/20 08/29/2020 Yes Kc, Ramesh, MD  esomeprazole (NEXIUM) 40 MG capsule TAKE 1 CAPSULE BY MOUTH EVERY MORNING 30 MINUTES BEFORE BREAKFAST Patient taking differently: Take 40 mg by mouth daily.  04/28/20  Yes Pyrtle, Lajuan Lines, MD  feeding supplement, ENSURE ENLIVE, (ENSURE ENLIVE) LIQD Take 237 mLs by mouth 3 (three) times daily between meals. 07/08/20  Yes Patrecia Pour, MD  folic acid (FOLVITE) 1 MG tablet Take 1 mg by mouth daily.   Yes [provider]  glucagon (GLUCAGON EMERGENCY) 1 MG injection Inject 1 mg into the skin once as needed (For severe low blood sugar). 06/14/19  Yes Elayne Snare, MD  guaiFENesin (MUCINEX) 600 MG 12 hr tablet Take 1 tablet (600 mg total) by mouth 2 (two) times daily. 07/21/20  Yes Florencia Reasons, MD  HYDROcodone-acetaminophen (NORCO/VICODIN) 5-325 MG tablet Take 1 tablet by mouth every 6 (six) hours as needed for moderate pain.   Yes [provider]  insulin aspart (NOVOLOG FLEXPEN) 100 UNIT/ML FlexPen Inject 3-5 Units into the skin 3 (three) times daily with meals. Depending on the blood sugar level.   Yes [provider]  insulin degludec (TRESIBA FLEXTOUCH) 100 UNIT/ML FlexTouch Pen INJECT 12 UNITS DAILY. IF FASTING BLOOD SUGARS ARE OVER 140 INCREASE DOSAGE UP TO 15 UNITS DAILY AS DIRECTED. 07/17/20  Yes Elayne Snare, MD  moxifloxacin (AVELOX) 400 MG tablet Take 1  tablet (400 mg total) by mouth daily at 8 pm. 08/11/20  Yes Cleaver, Jossie Ng, NP  [facility] VERIO test strip USE 1 STRIP TO [place] DAILY 02/24/20  Yes Elayne Snare, MD  oxymetazoline (AFRIN) 0.05 % nasal spray Place 1 spray into both nostrils 2 (two) times daily as needed for congestion.   Yes [provider]  pioglitazone (ACTOS) 15 MG tablet Take 15 mg by mouth daily. 06/26/20  Yes [provider]  polyethylene glycol (MIRALAX / GLYCOLAX) 17 g packet Take 17 g by mouth daily as needed for severe constipation. hold if having multiple loose stools per day 07/21/20  Yes Florencia Reasons, MD  potassium chloride SA (KLOR-CON) 20 MEQ tablet Take 1 tablet (20 mEq total) by mouth daily. 07/21/20  Yes Florencia Reasons, MD  rosuvastatin (CRESTOR) 5 MG tablet TAKE 1 TABLET BY MOUTH EVERY DAY Patient taking differently: Take 5 mg by mouth daily.  07/14/20  Yes Elayne Snare, MD  sacubitril-valsartan (ENTRESTO) 24-26 MG Take 2 tablets by mouth 2 (two) times daily.   Yes [provider]  senna (SENOKOT) 8.6 MG TABS tablet Take 1 tablet (8.6 mg total) by mouth at bedtime as needed for moderate constipation. 07/21/20  Yes Florencia Reasons, MD  thiamine (VITAMIN B-1) 50 MG tablet Take 50 mg by mouth daily.   Yes [provider]  chlorpheniramine-HYDROcodone (TUSSIONEX) 10-8 MG/5ML SUER Take 5 mLs by mouth every 12 (twelve) hours. Patient not taking: Reported on 09/04/2020 07/21/20   Florencia Reasons, MD  dronabinol (MARINOL) 2.5 MG capsule TAKE 1 CAPSULE BY MOUTH TWICE DAILY BEFORE A MEAL Patient not taking: Reported on 08/25/2020 07/15/20   Heilingoetter, Cassandra L, PA-C  guaiFENesin-codeine 100-10 MG/5ML syrup Take 5 mLs by mouth every 6 (six) hours as needed for cough. Patient not taking: Reported on 08/23/2020 07/28/20   Harle Stanford., PA-C  oxyCODONE (OXY IR/ROXICODONE) 5 MG immediate release tablet Take 1 tablet (5 mg total) by mouth every 4 (four) hours as needed for severe pain. 07/21/20   Florencia Reasons, MD    traMADol (ULTRAM) 50 MG tablet Take 1 tablet (50 mg total) by mouth every 12 (twelve) hours as needed. Patient not taking: Reported on 08/30/2020 07/21/20   Florencia Reasons, MD     Critical care time: The patient is critically ill with multiple organ systems failure and requires high complexity decision making for assessment and support, frequent evaluation and titration of therapies, application of advanced monitoring technologies and extensive interpretation of multiple databases.   Critical Care Time devoted to patient care services described in this note is  40 Minutes. This time reflects time of care of this signee. This critical care time does not reflect procedure time, or teaching time or supervisory time of PA/NP/Med student/Med Resident etc but could involve care discussion time.  Jacques Earthly, M.D. [facility] Pulmonary/Critical Care Medicine After hours pager: [phone].

## 2020-08-26 NOTE — ED Notes (Signed)
Respiratory called for pt placement on Bipap

## 2020-08-26 NOTE — ED Triage Notes (Signed)
EMS reports from home, family called out for lethargy. Pt Hx of PNA with second round of ABX over last couple weeks per family. Hx of Leukemia and failure to thrive, not eating well. On arrival Pt was hypotensive.  BP 76/44 HR 90 RR 18 Sp02 100 RA CBG 141 Temp 97.9  20ga L wrist. 1551ml NS enroute

## 2020-08-26 NOTE — ED Provider Notes (Addendum)
[name] Provider Note   CSN: [id number] Arrival date & time: 08/20/2020  1549     History Chief Complaint  Patient presents with  . Hypotension  . Weakness    Tony Marshall is a 79 y.o. male.  HPI    Patient presents listlessness.  From home. History is obtained by the patient and EMS report. Patient reportedly has a history of leukemia, is DNR. No clear time course provided, and the patient self denies any complaints including weakness, however, per report family members have been concerned about his weakness, anorexia.  Reportedly patient also history of pneumonia, completed 2 courses of antibiotics.  EMS reports the patient was hypotensive on arrival, 76/44.  He received 1.5 L fluid resuscitation.  Cardiology Office Note 08/11/20: [name]. [name] 78 year old male presented to the clinic on 08/10/2020.  I ordered chest x-ray due to productive cough and bilateral rhonchorous lung sounds throughout all lung fields.   Chest x-ray showed   Airspace consolidation and volume loss in the left lower lobe concerning for pneumonia. Small left pleural effusion. Small bilateral pleural effusions. No evidence of pulmonary edema. No pneumothorax. No suspicious appearing pulmonary nodules or masses are noted. Heart size is normal. Mitral annular calcifications. Upper mediastinal contours are within normal limits. Aortic atherosclerosis.    Past Medical History:  Diagnosis Date  . A-fib ([name])   . CLL (chronic lymphocytic leukemia) ([name]) dx'd 2014   progression 12/2019  . Diabetes mellitus without complication ([name])   . Diverticulosis   . External hemorrhoids   . GERD (gastroesophageal reflux disease)   . Hiatal hernia   . History of colon polyps   . Hyperlipidemia   . Hypertension   . Internal hemorrhoids   . Lymphocytosis   . Schatzki's ring   . Sleep apnea     Patient Active Problem List   Diagnosis Date Noted  . Advanced care  planning/counseling discussion   . Palliative care by specialist   . Cough   . DNR (do not resuscitate)   . Physical deconditioning 07/19/2020  . Stercoral ulcer of rectum   . Prolonged QT interval 07/03/2020  . Atrial fibrillation ([place])   . Shock circulatory ([name]) 07/01/2020  . Lower GI bleed   . Central venous catheter in place   . Fecal impaction ([name])   . Overflow diarrhea   . Rectal bleeding   . DKA (diabetic ketoacidoses) ([place]) 06/28/2020  . Systolic and diastolic CHF, chronic ([name]) 06/28/2020  . Anemia associated with acute blood loss   . Dehydration 06/27/2020  . Protein-calorie malnutrition, severe ([name]) 06/09/2020  . Hyperglycemia due to diabetes mellitus ([name])   . Leukopenia   . Acute combined systolic and diastolic heart failure ([name])   . Cardiogenic shock ([name])   . Altered mental status   . Failure to thrive in adult   . Symptomatic anemia   . Acute respiratory failure ([name])   . Increased anion gap metabolic acidosis 40/07/6760  . Goals of care, counseling/discussion 03/28/2020  . Encounter for antineoplastic chemotherapy 03/28/2020  . GERD (gastroesophageal reflux disease) 05/13/2014  . History of colonic polyps 05/13/2014  . Hiatal hernia 05/13/2014  . CLL (chronic lymphocytic leukemia) ([name]) 10/25/2013  . ED (erectile dysfunction) 10/20/2013  . Lymphocytosis 09/23/2013  . Essential hypertension 07/21/2013  . Pure hypercholesterolemia 07/21/2013  . Diabetes mellitus ([name]) 07/20/2013    Past Surgical History:  Procedure Laterality Date  . BLEPHAROPLASTY    . COLONOSCOPY  2012  .  FLEXIBLE SIGMOIDOSCOPY N/A 07/05/2020   Procedure: FLEXIBLE SIGMOIDOSCOPY;  Surgeon: Thornton Park, MD;  Location: [facility] ENDOSCOPY;  Service: Gastroenterology;  Laterality: N/A;  . HEMORRHOID SURGERY     Sclerotherapy  . KNEE SURGERY Right 2002  . SHOULDER SURGERY Right 2007       Family History  Problem Relation Age of Onset  . Kidney disease Mother   . CAD Maternal  Aunt   . Colon cancer Neg Hx   . Esophageal cancer Neg Hx   . Rectal cancer Neg Hx   . Stomach cancer Neg Hx     Social History   Tobacco Use  . Smoking status: Never Smoker  . Smokeless tobacco: Former Systems developer    Types: Secondary school teacher  . Vaping Use: Never used  Substance Use Topics  . Alcohol use: Yes    Alcohol/week: 6.0 standard drinks    Types: 6 Cans of beer per week  . Drug use: No    Home Medications Prior to Admission medications   Medication Sig Start Date End Date Taking? Authorizing Provider  acetaminophen (TYLENOL) 500 MG tablet Take 500 mg by mouth every 6 (six) hours as needed for moderate pain.    [provider]  allopurinol (ZYLOPRIM) 100 MG tablet Take 1 tablet (100 mg total) by mouth 2 (two) times daily. 03/28/20   Curt Bears, MD  ALPRAZolam Duanne Moron) 0.25 MG tablet Take 1 tablet (0.25 mg total) by mouth at bedtime as needed for anxiety. 06/21/20   Curt Bears, MD  amiodarone (PACERONE) 200 MG tablet Take 1 tablet (200 mg total) by mouth daily. 07/09/20   Patrecia Pour, MD  benzonatate (TESSALON PERLES) 100 MG capsule Take 100 mg by mouth 3 (three) times daily as needed for cough.    [provider]  carvedilol (COREG) 12.5 MG tablet Take 1 tablet (12.5 mg total) by mouth 2 (two) times daily with a meal. 06/20/20 07/20/20  Antonieta Pert, MD  chlorpheniramine-HYDROcodone (TUSSIONEX) 10-8 MG/5ML SUER Take 5 mLs by mouth every 12 (twelve) hours. 07/21/20   Florencia Reasons, MD  dronabinol (MARINOL) 2.5 MG capsule TAKE 1 CAPSULE BY MOUTH TWICE DAILY BEFORE A MEAL Patient taking differently: Take 2.5 mg by mouth 2 (two) times daily before lunch and supper.  07/15/20   Heilingoetter, Cassandra L, PA-C  esomeprazole (NEXIUM) 40 MG capsule TAKE 1 CAPSULE BY MOUTH EVERY MORNING 30 MINUTES BEFORE BREAKFAST Patient taking differently: Take 40 mg by mouth daily.  04/28/20   Pyrtle, Lajuan Lines, MD  feeding supplement, ENSURE ENLIVE, (ENSURE ENLIVE) LIQD Take 237 mLs by mouth 3  (three) times daily between meals. 07/08/20   Patrecia Pour, MD  glucagon (GLUCAGON EMERGENCY) 1 MG injection Inject 1 mg into the skin once as needed (For severe low blood sugar). 06/14/19   Elayne Snare, MD  guaiFENesin (MUCINEX) 600 MG 12 hr tablet Take 1 tablet (600 mg total) by mouth 2 (two) times daily. 07/21/20   Florencia Reasons, MD  guaiFENesin-codeine 100-10 MG/5ML syrup Take 5 mLs by mouth every 6 (six) hours as needed for cough. 07/28/20   Tanner, Lyndon Code., PA-C  HYDROcodone-acetaminophen (NORCO/VICODIN) 5-325 MG tablet Take 1 tablet by mouth every 6 (six) hours as needed for moderate pain.    [provider]  insulin aspart (NOVOLOG FLEXPEN) 100 UNIT/ML FlexPen Inject 3-5 Units into the skin 3 (three) times daily with meals. Depending on the blood sugar level.    [provider]  insulin degludec (TRESIBA FLEXTOUCH)  100 UNIT/ML FlexTouch Pen INJECT 12 UNITS DAILY. IF FASTING BLOOD SUGARS ARE OVER 140 INCREASE DOSAGE UP TO 15 UNITS DAILY AS DIRECTED. 07/17/20   Elayne Snare, MD  moxifloxacin (AVELOX) 400 MG tablet Take 1 tablet (400 mg total) by mouth daily at 8 pm. 08/11/20   Cleaver, Jossie Ng, NP  [facility] VERIO test strip USE 1 STRIP TO CHECK GLUCOSE THREE TIMES DAILY 02/24/20   Elayne Snare, MD  oxyCODONE (OXY IR/ROXICODONE) 5 MG immediate release tablet Take 1 tablet (5 mg total) by mouth every 4 (four) hours as needed for severe pain. 07/21/20   Florencia Reasons, MD  oxymetazoline (AFRIN) 0.05 % nasal spray Place 1 spray into both nostrils 2 (two) times daily as needed for congestion.    [provider]  pioglitazone (ACTOS) 15 MG tablet Take 15 mg by mouth daily. 06/26/20   [provider]  polyethylene glycol (MIRALAX / GLYCOLAX) 17 g packet Take 17 g by mouth daily as needed for severe constipation. hold if having multiple loose stools per day 07/21/20   Florencia Reasons, MD  potassium chloride SA (KLOR-CON) 20 MEQ tablet Take 1 tablet (20 mEq total) by mouth daily. 07/21/20   Florencia Reasons, MD    rosuvastatin (CRESTOR) 5 MG tablet TAKE 1 TABLET BY MOUTH EVERY DAY Patient taking differently: Take 5 mg by mouth daily.  07/14/20   Elayne Snare, MD  senna (SENOKOT) 8.6 MG TABS tablet Take 1 tablet (8.6 mg total) by mouth at bedtime as needed for moderate constipation. 07/21/20   Florencia Reasons, MD  traMADol (ULTRAM) 50 MG tablet Take 1 tablet (50 mg total) by mouth every 12 (twelve) hours as needed. 07/21/20   Florencia Reasons, MD    Allergies    Glycopyrrolate  Review of Systems   Review of Systems  Allergic/Immunologic: Positive for immunocompromised state.  Neurological: Positive for weakness.    Physical Exam Updated Vital Signs BP (!) 70/53   Pulse 89   Temp 97.9 F (36.6 C) (Oral)   Resp 16   SpO2 96%   Physical Exam Vitals and nursing note reviewed.  Constitutional:      Appearance: He is well-developed. He is ill-appearing.  HENT:     Head: Normocephalic and atraumatic.  Eyes:     Conjunctiva/sclera: Conjunctivae normal.  Cardiovascular:     Rate and Rhythm: Normal rate and regular rhythm.  Pulmonary:     Comments: Substantial cough, tachypnea Abdominal:     General: There is no distension.     Tenderness: There is no abdominal tenderness.  Skin:    General: Skin is warm and dry.  Neurological:     Mental Status: He is alert and oriented to person, place, and time.     Motor: Atrophy present.     Comments: Generalized weakness, atrophy, no focal deficits, speech is brief, clear, face is symmetric.     ED Results / Procedures / Treatments   Labs (all labs ordered are listed, but only abnormal results are displayed) Labs Reviewed  CBC WITH DIFFERENTIAL/PLATELET - Abnormal; Notable for the following components:      Result Value   WBC 3.3 (*)    RBC 1.92 (*)    Hemoglobin 6.1 (*)    HCT 20.0 (*)    MCV 104.2 (*)    RDW 20.0 (*)    Platelets 126 (*)    Neutro Abs 1.1 (*)    Abs Immature Granulocytes 0.10 (*)    All other components within  normal limits  BRAIN  NATRIURETIC PEPTIDE - Abnormal; Notable for the following components:   B Natriuretic Peptide 393.1 (*)    All other components within normal limits  LACTIC ACID, PLASMA - Abnormal; Notable for the following components:   Lactic Acid, Venous 2.1 (*)    All other components within normal limits  SARS CORONAVIRUS 2 BY RT PCR (HOSPITAL ORDER, [facility])  URINALYSIS, ROUTINE W REFLEX MICROSCOPIC  LACTIC ACID, PLASMA  COMPREHENSIVE METABOLIC PANEL  MAGNESIUM  TYPE AND SCREEN  PREPARE RBC (CROSSMATCH)    Radiology DG Chest Port 1 View  Result Date: 08/17/2020 CLINICAL DATA:  Weakness. EXAM: PORTABLE CHEST 1 VIEW COMPARISON:  08/10/2020 FINDINGS: The heart size is unremarkable. There are new moderate to large bilateral pleural effusions. There are hazy perihilar and bibasilar airspace opacities which are new from prior study. There is no pneumothorax. No acute osseous abnormality. Aortic calcifications are noted. IMPRESSION: 1. New moderate to large bilateral pleural effusions. 2. Worsening bibasilar airspace disease may represent consolidation or atelectasis. Electronically Signed   By: Constance Holster M.D.   On: 08/18/2020 17:17    Procedures Procedures (including critical care time)  Medications Ordered in ED Medications  chlorpheniramine-HYDROcodone (TUSSIONEX) 10-8 MG/5ML suspension 5 mL (5 mLs Oral Given 08/21/2020 1909)  vancomycin (VANCOCIN) IVPB 1000 mg/200 mL premix (has no administration in time range)  0.9 %  sodium chloride infusion (10 mL/hr Intravenous New Bag/Given (Non-Interop) 08/25/2020 2000)  piperacillin-tazobactam (ZOSYN) IVPB 3.375 g (3.375 g Intravenous New Bag/Given 08/18/2020 1959)   FINDINGS   Left Ventricle: Left ventricular ejection fraction, by estimation, is 25  to 30%. The left ventricle has severely decreased function. The left  ventricle has no regional wall motion abnormalities. Definity contrast  agent was given IV to delineate the  left   ventricular endocardial borders. The left ventricular internal cavity  size was normal in size. There is no left ventricular hypertrophy. Left  ventricular diastolic parameters are consistent with Grade II diastolic  dysfunction (pseudonormalization).  4. Cough. Patient has had persistent cough since hospitalization at end of June and early July. Patient was diagnosed with CHF at this time and started on Entresto. Family states that the cough can be pretty severe and he will cough for up to an hour. The cough starts out as a dry cough but he will cough up sputum. Family states that Dr. Julien Nordmann believes this may be a reaction to his heart failure medications. And the cough does seem to have started close after starting the [facility]. Patient has appointment with cardiology on 08/10/2020. They have reached out to the cardiology office to get this moved up to see if medications is what causing his cough and what recommendations cardiology has. Daughter does state that they did not give him the Entresto last night and he did not cough as much. This morning he is coughing. They have tried [employer], Tussionex, guaifenesin with codeine with no relief of the cough. PCP did start him on hydrocodone/APAP 5/325 mg every 6 hours as needed to see if this will help with his cough. Family does state that he slept best last night than he has in a long time. Continue with follow-up and recommendations by cardiology    His blood pressure has been high for over 30 years and previously difficult to control. He was evaluated in 1/08 with urine metanephrines and renal artery ultrasound.     Current medication regimen: Only Entresto since he had  low blood pressure readings during hospitalization      BP Readings from Last 3 Encounters:  08/07/20 100/60  08/05/20 102/64  07/31/20 106/62   Kron R. Lindahl 79 year old male presented to the clinic on 08/10/2020.  I ordered chest x-ray due to productive cough and  bilateral rhonchorous lung sounds throughout all lung fields.   Chest x-ray showed   Airspace consolidation and volume loss in the left lower lobe concerning for pneumonia. Small left pleural effusion. Small bilateral pleural effusions. No evidence of pulmonary edema. No pneumothorax. No suspicious appearing pulmonary nodules or masses are noted. Heart size is normal. Mitral annular calcifications. Upper mediastinal contours are within normal limits. Aortic atherosclerosis.  ED Course  I have reviewed the triage vital signs and the nursing notes.  Pertinent labs & imaging results that were available during my care of the patient were reviewed by me and considered in my medical decision making (see chart for details).     On arrival the patient is awake, alert, but remains hypotensive, in spite of initial fluid resuscitation. After reviewing his history, sclerae has multiple medical issues, including cough for at least 1 month, leukemia, heart failure, with EF about 25%, additional chart review items as above.  With consideration of recurrent/refractory pneumonia given his hypotension, cough, patient received broad-spectrum antibiotics.  With his hypotension continue received fluids, pending labs.  Update:, Labs notable for anemia, hemoglobin 6.1, down from baseline 8. Patient has a type, screen, will receive transfusion when available.  9:13 PM With persistent cough, bilateral opacifications, patient will start BiPAP.  I discussed this with patient, his wife.  Patient tolerating BiPAP.  This adult male with multiple medical issues including leukemia, ongoing failure to thrive like condition, now presents with cough, hypotension Patient is afebrile, but with cough, leukopenia, immunocompromise state received broad-spectrum antibiotics. Given his persistent hypotension he received initial fluids, with heart failure is a consideration, will require additional blood to address this.   Notably, patient's blood pressure values are down from baseline, which is only about 680 systolic, on most recent discharge. Given the patient's critical illness, anemia, hypotension, bilateral pneumonia, leukemia, he will require admission to the ICU for further monitoring, management.  I discussed goals of care with patient and his wife, they confirm DNR, DNI, with goal for balancing symptoms with medical interventions.  Final Clinical Impression(s) / ED Diagnoses Final diagnoses:  HCAP (healthcare-associated pneumonia)  Symptomatic anemia  Hypotension due to hypovolemia   CRITICAL CARE Performed by: Carmin Muskrat Total critical care time: 35 minutes Critical care time was exclusive of separately billable procedures and treating other patients. Critical care was necessary to treat or prevent imminent or life-threatening deterioration. Critical care was time spent personally by me on the following activities: development of treatment plan with patient and/or surrogate as well as nursing, discussions with consultants, evaluation of patient's response to treatment, examination of patient, obtaining history from patient or surrogate, ordering and performing treatments and interventions, ordering and review of laboratory studies, ordering and review of radiographic studies, pulse oximetry and re-evaluation of patient's condition.    Carmin Muskrat, MD 09/13/2020 2118    Carmin Muskrat, MD 08/21/2020 2255

## 2020-08-26 NOTE — ED Notes (Signed)
Date and time results received: 08/29/2020 "now"   (use smartphrase ".now" to insert current time)  Test: hbg Critical Value: 6.1  Name of Provider Notified: Dr. Vanita Panda  Orders Received? Or Actions Taken?: no orders at this time

## 2020-08-26 NOTE — Progress Notes (Signed)
Pt. placed on BiPAP V-60 per order, does have hx. of Sleep Apnea, has worn one here before for similar episode, currently 100% on room air, wife remains at bedside and made this RT aware that pt. would remove on own or with her assistance when needed, wife made aware to notify with call bell which was placed w/in reach of both to notify staff/RN/RT to be made aware if need, DR. [place] spoken with about pt., RT to monitor.

## 2020-08-26 NOTE — ED Notes (Signed)
Date and time results received: 08/28/2020 2110 (use smartphrase ".now" to insert current time)  Test: lactic acid Critical Value: 2.1  Name of Provider Notified: Dr. Vanita Panda  Orders Received? Or Actions Taken?: Actions Taken: provider notified see orders

## 2020-08-27 ENCOUNTER — Inpatient Hospital Stay (HOSPITAL_COMMUNITY): Payer: Medicare HMO

## 2020-08-27 DIAGNOSIS — J939 Pneumothorax, unspecified: Secondary | ICD-10-CM | POA: Diagnosis not present

## 2020-08-27 DIAGNOSIS — J9600 Acute respiratory failure, unspecified whether with hypoxia or hypercapnia: Secondary | ICD-10-CM | POA: Diagnosis not present

## 2020-08-27 DIAGNOSIS — R627 Adult failure to thrive: Secondary | ICD-10-CM | POA: Diagnosis not present

## 2020-08-27 DIAGNOSIS — R2681 Unsteadiness on feet: Secondary | ICD-10-CM | POA: Diagnosis not present

## 2020-08-27 DIAGNOSIS — T8182XA Emphysema (subcutaneous) resulting from a procedure, initial encounter: Secondary | ICD-10-CM | POA: Diagnosis not present

## 2020-08-27 DIAGNOSIS — D61818 Other pancytopenia: Secondary | ICD-10-CM | POA: Diagnosis not present

## 2020-08-27 DIAGNOSIS — E876 Hypokalemia: Secondary | ICD-10-CM | POA: Diagnosis not present

## 2020-08-27 DIAGNOSIS — G9341 Metabolic encephalopathy: Secondary | ICD-10-CM | POA: Diagnosis not present

## 2020-08-27 DIAGNOSIS — Z7189 Other specified counseling: Secondary | ICD-10-CM | POA: Diagnosis not present

## 2020-08-27 DIAGNOSIS — R410 Disorientation, unspecified: Secondary | ICD-10-CM | POA: Diagnosis not present

## 2020-08-27 DIAGNOSIS — N179 Acute kidney failure, unspecified: Secondary | ICD-10-CM

## 2020-08-27 DIAGNOSIS — G9340 Encephalopathy, unspecified: Secondary | ICD-10-CM | POA: Diagnosis not present

## 2020-08-27 DIAGNOSIS — E43 Unspecified severe protein-calorie malnutrition: Secondary | ICD-10-CM | POA: Diagnosis not present

## 2020-08-27 DIAGNOSIS — E861 Hypovolemia: Secondary | ICD-10-CM | POA: Diagnosis not present

## 2020-08-27 DIAGNOSIS — R6520 Severe sepsis without septic shock: Secondary | ICD-10-CM | POA: Diagnosis not present

## 2020-08-27 DIAGNOSIS — Y95 Nosocomial condition: Secondary | ICD-10-CM | POA: Diagnosis not present

## 2020-08-27 DIAGNOSIS — I517 Cardiomegaly: Secondary | ICD-10-CM | POA: Diagnosis not present

## 2020-08-27 DIAGNOSIS — I9589 Other hypotension: Secondary | ICD-10-CM | POA: Insufficient documentation

## 2020-08-27 DIAGNOSIS — J189 Pneumonia, unspecified organism: Secondary | ICD-10-CM | POA: Diagnosis not present

## 2020-08-27 DIAGNOSIS — E872 Acidosis: Secondary | ICD-10-CM | POA: Diagnosis present

## 2020-08-27 DIAGNOSIS — R918 Other nonspecific abnormal finding of lung field: Secondary | ICD-10-CM | POA: Diagnosis not present

## 2020-08-27 DIAGNOSIS — J930 Spontaneous tension pneumothorax: Secondary | ICD-10-CM | POA: Diagnosis not present

## 2020-08-27 DIAGNOSIS — R2689 Other abnormalities of gait and mobility: Secondary | ICD-10-CM | POA: Diagnosis not present

## 2020-08-27 DIAGNOSIS — R6521 Severe sepsis with septic shock: Secondary | ICD-10-CM

## 2020-08-27 DIAGNOSIS — I5041 Acute combined systolic (congestive) and diastolic (congestive) heart failure: Secondary | ICD-10-CM | POA: Diagnosis not present

## 2020-08-27 DIAGNOSIS — G4733 Obstructive sleep apnea (adult) (pediatric): Secondary | ICD-10-CM | POA: Diagnosis not present

## 2020-08-27 DIAGNOSIS — J9382 Other air leak: Secondary | ICD-10-CM | POA: Diagnosis present

## 2020-08-27 DIAGNOSIS — J9383 Other pneumothorax: Secondary | ICD-10-CM | POA: Diagnosis not present

## 2020-08-27 DIAGNOSIS — Z452 Encounter for adjustment and management of vascular access device: Secondary | ICD-10-CM | POA: Diagnosis not present

## 2020-08-27 DIAGNOSIS — T797XXA Traumatic subcutaneous emphysema, initial encounter: Secondary | ICD-10-CM | POA: Diagnosis not present

## 2020-08-27 DIAGNOSIS — J9601 Acute respiratory failure with hypoxia: Secondary | ICD-10-CM

## 2020-08-27 DIAGNOSIS — Z9689 Presence of other specified functional implants: Secondary | ICD-10-CM | POA: Diagnosis not present

## 2020-08-27 DIAGNOSIS — D649 Anemia, unspecified: Secondary | ICD-10-CM | POA: Diagnosis not present

## 2020-08-27 DIAGNOSIS — Z681 Body mass index (BMI) 19 or less, adult: Secondary | ICD-10-CM | POA: Diagnosis not present

## 2020-08-27 DIAGNOSIS — J90 Pleural effusion, not elsewhere classified: Secondary | ICD-10-CM | POA: Diagnosis not present

## 2020-08-27 DIAGNOSIS — K633 Ulcer of intestine: Secondary | ICD-10-CM | POA: Diagnosis present

## 2020-08-27 DIAGNOSIS — Z515 Encounter for palliative care: Secondary | ICD-10-CM | POA: Diagnosis not present

## 2020-08-27 DIAGNOSIS — T829XXA Unspecified complication of cardiac and vascular prosthetic device, implant and graft, initial encounter: Secondary | ICD-10-CM | POA: Diagnosis not present

## 2020-08-27 DIAGNOSIS — J9690 Respiratory failure, unspecified, unspecified whether with hypoxia or hypercapnia: Secondary | ICD-10-CM | POA: Diagnosis not present

## 2020-08-27 DIAGNOSIS — A419 Sepsis, unspecified organism: Secondary | ICD-10-CM | POA: Diagnosis not present

## 2020-08-27 DIAGNOSIS — J95811 Postprocedural pneumothorax: Secondary | ICD-10-CM | POA: Diagnosis not present

## 2020-08-27 DIAGNOSIS — Z4682 Encounter for fitting and adjustment of non-vascular catheter: Secondary | ICD-10-CM | POA: Diagnosis not present

## 2020-08-27 DIAGNOSIS — I5043 Acute on chronic combined systolic (congestive) and diastolic (congestive) heart failure: Secondary | ICD-10-CM | POA: Diagnosis not present

## 2020-08-27 DIAGNOSIS — D849 Immunodeficiency, unspecified: Secondary | ICD-10-CM | POA: Diagnosis present

## 2020-08-27 DIAGNOSIS — J9811 Atelectasis: Secondary | ICD-10-CM | POA: Diagnosis not present

## 2020-08-27 DIAGNOSIS — J690 Pneumonitis due to inhalation of food and vomit: Secondary | ICD-10-CM | POA: Diagnosis not present

## 2020-08-27 DIAGNOSIS — Y848 Other medical procedures as the cause of abnormal reaction of the patient, or of later complication, without mention of misadventure at the time of the procedure: Secondary | ICD-10-CM | POA: Diagnosis not present

## 2020-08-27 DIAGNOSIS — E871 Hypo-osmolality and hyponatremia: Secondary | ICD-10-CM | POA: Diagnosis not present

## 2020-08-27 DIAGNOSIS — C9110 Chronic lymphocytic leukemia of B-cell type not having achieved remission: Secondary | ICD-10-CM | POA: Diagnosis not present

## 2020-08-27 DIAGNOSIS — R0602 Shortness of breath: Secondary | ICD-10-CM | POA: Diagnosis not present

## 2020-08-27 DIAGNOSIS — I7090 Unspecified atherosclerosis: Secondary | ICD-10-CM | POA: Diagnosis not present

## 2020-08-27 DIAGNOSIS — Z20822 Contact with and (suspected) exposure to covid-19: Secondary | ICD-10-CM | POA: Diagnosis not present

## 2020-08-27 DIAGNOSIS — Z66 Do not resuscitate: Secondary | ICD-10-CM | POA: Diagnosis not present

## 2020-08-27 DIAGNOSIS — J984 Other disorders of lung: Secondary | ICD-10-CM | POA: Diagnosis not present

## 2020-08-27 DIAGNOSIS — M6281 Muscle weakness (generalized): Secondary | ICD-10-CM | POA: Diagnosis not present

## 2020-08-27 LAB — BASIC METABOLIC PANEL
Anion gap: 11 (ref 5–15)
BUN: 33 mg/dL — ABNORMAL HIGH (ref 8–23)
CO2: 16 mmol/L — ABNORMAL LOW (ref 22–32)
Calcium: 8.3 mg/dL — ABNORMAL LOW (ref 8.9–10.3)
Chloride: 102 mmol/L (ref 98–111)
Creatinine, Ser: 1.32 mg/dL — ABNORMAL HIGH (ref 0.61–1.24)
GFR calc Af Amer: >60 mL/min (ref >60)
GFR calc non Af Amer: 52 mL/min — ABNORMAL LOW (ref >60)
Glucose, Bld: 79 mg/dL (ref 70–99)
Potassium: 4.9 mmol/L (ref 3.5–5.1)
Sodium: 129 mmol/L — ABNORMAL LOW (ref 135–145)

## 2020-08-27 LAB — URINALYSIS, ROUTINE W REFLEX MICROSCOPIC
Bilirubin Urine: NEGATIVE
Glucose, UA: NEGATIVE mg/dL
Ketones, ur: NEGATIVE mg/dL
Nitrite: NEGATIVE
Protein, ur: 30 mg/dL — AB
Specific Gravity, Urine: 1.021 (ref 1.005–1.030)
pH: 5 (ref 5.0–8.0)

## 2020-08-27 LAB — CBC
HCT: 26.6 % — ABNORMAL LOW (ref 39.0–52.0)
HCT: 21.1 % — ABNORMAL LOW (ref 39.0–52.0)
Hemoglobin: 8.3 g/dL — ABNORMAL LOW (ref 13.0–17.0)
Hemoglobin: 6.7 g/dL — CL (ref 13.0–17.0)
MCH: 31.6 pg (ref 26.0–34.0)
MCH: 31 pg (ref 26.0–34.0)
MCHC: 31.2 g/dL (ref 30.0–36.0)
MCHC: 31.8 g/dL (ref 30.0–36.0)
MCV: 101.1 fL — ABNORMAL HIGH (ref 80.0–100.0)
MCV: 97.7 fL (ref 80.0–100.0)
Platelets: 151 10*3/uL (ref 150–400)
Platelets: 131 10*3/uL — ABNORMAL LOW (ref 150–400)
RBC: 2.63 MIL/uL — ABNORMAL LOW (ref 4.22–5.81)
RBC: 2.16 MIL/uL — ABNORMAL LOW (ref 4.22–5.81)
RDW: 20.2 % — ABNORMAL HIGH (ref 11.5–15.5)
RDW: 20.3 % — ABNORMAL HIGH (ref 11.5–15.5)
WBC: 4.4 10*3/uL (ref 4.0–10.5)
WBC: 6.1 10*3/uL (ref 4.0–10.5)
nRBC: 0 % (ref 0.0–0.2)
nRBC: 0 % (ref 0.0–0.2)

## 2020-08-27 LAB — LACTIC ACID, PLASMA: Lactic Acid, Venous: 1.5 mmol/L (ref 0.5–1.9)

## 2020-08-27 LAB — MAGNESIUM: Magnesium: 1.5 mg/dL — ABNORMAL LOW (ref 1.7–2.4)

## 2020-08-27 LAB — GLUCOSE, CAPILLARY
Glucose-Capillary: 79 mg/dL (ref 70–99)
Glucose-Capillary: 107 mg/dL — ABNORMAL HIGH (ref 70–99)
Glucose-Capillary: 103 mg/dL — ABNORMAL HIGH (ref 70–99)
Glucose-Capillary: 149 mg/dL — ABNORMAL HIGH (ref 70–99)

## 2020-08-27 LAB — MRSA PCR SCREENING: MRSA by PCR: NEGATIVE

## 2020-08-27 LAB — HEMOGLOBIN AND HEMATOCRIT, BLOOD
HCT: 33.9 % — ABNORMAL LOW (ref 39.0–52.0)
Hemoglobin: 11 g/dL — ABNORMAL LOW (ref 13.0–17.0)

## 2020-08-27 LAB — PHOSPHORUS: Phosphorus: 2.7 mg/dL (ref 2.5–4.6)

## 2020-08-27 LAB — FIBRINOGEN: Fibrinogen: 443 mg/dL (ref 210–475)

## 2020-08-27 MED ORDER — ORAL CARE MOUTH RINSE
15.0000 mL | Freq: Two times a day (BID) | OROMUCOSAL | Status: DC
  Administered 2020-08-28 – 2020-09-12 (×30): 15 mL via OROMUCOSAL

## 2020-08-27 MED ORDER — CHLORHEXIDINE GLUCONATE CLOTH 2 % EX PADS
6.0000 | MEDICATED_PAD | Freq: Every day | CUTANEOUS | Status: DC
  Administered 2020-08-27 – 2020-09-08 (×13): 6 via TOPICAL

## 2020-08-27 MED ORDER — DOCUSATE SODIUM 100 MG PO CAPS: 100.0000 mg | ORAL_CAPSULE | Freq: Two times a day (BID) | ORAL | Status: DC | PRN

## 2020-08-27 MED ORDER — VANCOMYCIN HCL 500 MG/100ML IV SOLN
500.0000 mg | INTRAVENOUS | Status: DC
  Administered 2020-08-27: 500 mg via INTRAVENOUS
  Filled 2020-08-27: qty 100

## 2020-08-27 MED ORDER — SODIUM CHLORIDE 0.9% FLUSH
10.0000 mL | Freq: Two times a day (BID) | INTRAVENOUS | Status: DC
  Administered 2020-08-27 (×2): 10 mL
  Administered 2020-08-28: 40 mL
  Administered 2020-08-28: 10 mL
  Administered 2020-08-29: 30 mL
  Administered 2020-08-29 – 2020-09-10 (×24): 10 mL

## 2020-08-27 MED ORDER — GUAIFENESIN-DM 100-10 MG/5ML PO SYRP
10.0000 mL | ORAL_SOLUTION | ORAL | Status: DC | PRN
  Administered 2020-08-27 – 2020-09-10 (×16): 10 mL via ORAL
  Filled 2020-08-27 (×17): qty 10

## 2020-08-27 MED ORDER — POLYETHYLENE GLYCOL 3350 17 G PO PACK: 17.0000 g | PACK | Freq: Every day | ORAL | Status: DC | PRN

## 2020-08-27 MED ORDER — PANTOPRAZOLE SODIUM 40 MG IV SOLR
40.0000 mg | INTRAVENOUS | Status: DC
  Administered 2020-08-27 – 2020-09-04 (×9): 40 mg via INTRAVENOUS
  Filled 2020-08-27 (×9): qty 40

## 2020-08-27 MED ORDER — LIDOCAINE HCL 1 % IJ SOLN
INTRAMUSCULAR | Status: AC
  Filled 2020-08-27: qty 20

## 2020-08-27 MED ORDER — ALPRAZOLAM 0.25 MG PO TABS
0.2500 mg | ORAL_TABLET | Freq: Every evening | ORAL | Status: DC | PRN
  Administered 2020-08-27 – 2020-08-29 (×3): 0.25 mg via ORAL
  Filled 2020-08-27 (×4): qty 1

## 2020-08-27 MED ORDER — CHLORHEXIDINE GLUCONATE CLOTH 2 % EX PADS: 6.0000 | MEDICATED_PAD | Freq: Every day | CUTANEOUS | Status: DC

## 2020-08-27 MED ORDER — MAGNESIUM SULFATE 4 GM/100ML IV SOLN
4.0000 g | Freq: Once | INTRAVENOUS | Status: AC
  Administered 2020-08-27: 4 g via INTRAVENOUS
  Filled 2020-08-27: qty 100

## 2020-08-27 MED ORDER — SODIUM CHLORIDE 0.9 % IV SOLN
2.0000 g | Freq: Two times a day (BID) | INTRAVENOUS | Status: AC
  Administered 2020-08-27 – 2020-09-02 (×14): 2 g via INTRAVENOUS
  Filled 2020-08-27 (×15): qty 2

## 2020-08-27 MED ORDER — SODIUM CHLORIDE 0.9% FLUSH: 10.0000 mL | INTRAVENOUS | Status: DC | PRN

## 2020-08-27 NOTE — Progress Notes (Signed)
[facility] Progress Note Patient Name: Tony Marshall DOB: Jul 27, 1941 MRN: [id number]   Date of Service  08/27/2020  HPI/Events of Note  Multiple issues: 1. Cough and 2. Patient request for home Xanax for sleep.  eICU Interventions  Plan: 1. Robitussin DM 10 mL PO Q 4 hours PRN cough. 2. Xanax 0.25 mg PO Q HS PRN sleep.     Intervention Category Major Interventions: Other:  Lysle Dingwall 08/27/2020, 9:09 PM

## 2020-08-27 NOTE — Progress Notes (Signed)
Pt. found off BiPAP V-60 in no distress, wife remains @ bedside was on room air prior to placement, made aware to notify if needed, RN made aware, order is PRN, Rt to monitor.

## 2020-08-27 NOTE — ED Notes (Signed)
Pt c/o trouble breathing. O2 saturations are between 92 and 94%. O2 placed on for comfort. 2L placed.

## 2020-08-27 NOTE — ED Notes (Signed)
Levo switched to fem line.

## 2020-08-27 NOTE — Progress Notes (Signed)
Pharmacy Antibiotic Note  KA FLAMMER is a 79 y.o. male admitted on 09/01/2020 with sepsis.  Pharmacy has been consulted for vancomycin and cefepime dosing.  No weight had been entered while in ED. The weight used for dosing is 52.4 kg which was listed at last visit on 8/4, giving a CrCl of 30 ml/min   Plan: Cefepime 2 gr IV q12h   Vancomycin 1000 mg IV x1, then 500 mg IV q24h  Monitor clinical course, renal function, cultures as available      Temp (24hrs), Avg:97.9 F (36.6 C), Min:97.9 F (36.6 C), Max:97.9 F (36.6 C)  Recent Labs  Lab 09/09/2020 1609 08/25/2020 1633 08/24/2020 1958  WBC 3.3*  --   --   CREATININE  --   --  1.54*  LATICACIDVEN  --  2.1* 3.2*    CrCl cannot be calculated (Unknown ideal weight.).    Allergies  Allergen Reactions  . Glycopyrrolate Rash    Antimicrobials this admission: 9/11 Zosyn x1 9/11 vancomycin >>  9/12 cefepime >>  Dose adjustments this admission:   Microbiology results: 9/11 COVID-19: Negative    Thank you for allowing pharmacy to be a part of this patient's care.  Royetta Asal, PharmD, BCPS 08/27/2020 1:39 AM

## 2020-08-27 NOTE — ED Provider Notes (Signed)
Patient needed central line access for both fluids and medications.  Intensivist was occupied elsewhere and requested by put in central line.  Attempt was made to put a right internal jugular line but is unable to have read the guidewire.  Postprocedure x-ray shows a small pneumothorax.  However, patient is having some difficulty breathing, so we will proceed with pigtail catheter.  Chest x-ray following chest tube insertion shows good position of the chest tube is still residual pneumothorax.  Patient is resting comfortably.  Critical care service will need to decide whether to proceed with central line.  Linus Salmons Line  Date/Time: 08/27/2020 4:15 AM Performed by: Delora Fuel, MD Authorized by: Delora Fuel, MD   Consent:    Consent obtained:  Written   Consent given by:  Patient   Risks discussed:  Arterial puncture, incorrect placement and pneumothorax   Alternatives discussed:  No treatment Pre-procedure details:    Hand hygiene: Hand hygiene performed prior to insertion     Sterile barrier technique: All elements of maximal sterile technique followed     Skin preparation:  2% chlorhexidine   Skin preparation agent: Skin preparation agent completely dried prior to procedure   Anesthesia (see MAR for exact dosages):    Anesthesia method:  Local infiltration   Local anesthetic:  Lidocaine 1% w/o epi Procedure details:    Location:  R internal jugular   Site selection rationale:  Ease of access   Patient position:  Flat   Procedural supplies:  Triple lumen   Catheter size:  7 Fr   Landmarks identified: yes     Ultrasound guidance: yes     Sterile ultrasound techniques: Sterile gel and sterile probe covers were used     Number of attempts:  1   Successful placement: no   Post-procedure details:    Patient tolerance of procedure:  Tolerated well, no immediate complications CHEST TUBE INSERTION  Date/Time: 08/27/2020 5:32 AM Performed by: Delora Fuel, MD Authorized by: Delora Fuel, MD   Consent:    Consent obtained:  Verbal   Consent given by:  Patient   Risks discussed:  Bleeding, incomplete drainage, infection and pain   Alternatives discussed:  No treatment Pre-procedure details:    Skin preparation:  ChloraPrep   Preparation: Patient was prepped and draped in the usual sterile fashion   Anesthesia (see MAR for exact dosages):    Anesthesia method:  Local infiltration   Local anesthetic:  Lidocaine 1% w/o epi Procedure details:    Placement location:  R lateral   Scalpel size:  11   Tube size (French): 13.   Dissection instrument: plastic dilator in kit.   Ultrasound guidance: no     Tension pneumothorax: no     Tube connected to:  Water seal and suction   Drainage characteristics: Air and yellow fluid.   Suture material:  2-0 silk   Dressing:  4x4 sterile gauze Post-procedure details:    Post-insertion x-ray findings: tube in good position     Patient tolerance of procedure:  Tolerated well, no immediate complications      Delora Fuel, MD 10/09/84 [phone]

## 2020-08-27 NOTE — Progress Notes (Signed)
NAME:  Tony Marshall, MRN:  [id number], DOB:  [birth year], LOS: 0 ADMISSION DATE:  09/06/2020, CONSULTATION DATE:  08/27/20 REFERRING MD:  EDP, CHIEF COMPLAINT:  lethargy  Brief History   79 yowm never smoker with history of HFrEF and CLL s/p three cycles chemo and rx avelox since 8/27    for PNA who presented with lethargy and poor po intake.  Found to have worsening bibasilar airspace disease and pleural effusions with hypotension despite 3.5L IVF. PCCM consulted for admission.  History of present illness   79 year old man with history of Atrial fibrillation not on anticoagulation, HFrEF, CLL s/p 3 cycles chemo, DM, HTN with recent PNA on 8/27 and has had 2 courses of antibiotics who was brought in for poor po intake and lethargy.  He is accompanied by his wife.  He has been feeling poorly for the last several weeks.  He feels some shortness of breath but denies chest pain.  Denies fevers or chills.  He has been coughing with mucus production that is green.  He does report sometimes choking on/coughing with food intake.  CXR significant with bilateral pleural effusions and worsenining bibasilar airspace disease.  He was placed on Bipap and given 3.5L IVF with Vanc and zosyn.  Labs were significant for WBC 3.3, Hgb 6.1 down from baseline of 9 several weeks ago, Lactic acid 3.2, Covid-19 negative.  Despite IVF he remained hypotensive, so vasopressors were initiated and PCCM consulted for admission  Past Medical History  Atrial fibrillation not on anticoagulation  HFrEF CLL s/p 3 cycles chemo DM HTN GERD  OSA   Significant Hospital Events   9/11 Admit to PCCM  Consults:  None  Procedures:  9/12 RIJ attempted, R pigtail chest tube by ED 9/12 L Fem CVL  Significant Diagnostic Tests:       Micro Data:  Sars-Cov-2 PCR 9/11 >>negative Sputum culture  9/12 >>> Blood Cx  9/12>>> Urine Cx  9/12>>>  Antimicrobials:  Zozyn 9/11 only Vancomycin 9/11> Cefepime 9/12>  Scheduled  Meds: . lidocaine       Continuous Infusions: . ceFEPime (MAXIPIME) IV 2 g (08/27/20 0219)  . norepinephrine (LEVOPHED) Adult infusion 9 mcg/min (08/27/20 0847)  . vancomycin     PRN Meds:.docusate sodium, polyethylene glycol   Interim history/subjective:   a bit more alert c/o increase in chronic back pain on stretcher in ER/ bp up on levophed to goal at only 4 mcg  Objective   Blood pressure (!) 102/55, pulse 82, temperature 97.9 F (36.6 C), temperature source Oral, resp. rate 18, SpO2 100 %.        Intake/Output Summary (Last 24 hours) at 08/27/2020 0830 Last data filed at 09/08/2020 2305 Gross per 24 hour  Intake 238.74 ml  Output --  Net 238.74 ml     Tmax 97.9  Pt alert/ appears uncomfortable but not toxic, rattling congested cough / 02 - 2lpm with sats 98%  No jvd Oropharynx clear,  mucosa nl Neck supple Lungs with a few scattered exp > insp rhonchi bilaterally RRR no s3 or or sign murmur Abd soft nl excursion  Extr warm with trace piutting  edema  No  clubbing noted Neuro  Sensorium appears intact,  no apparent motor deficits though generalized muscle weakness noted     I personally reviewed images and agree with radiology impression as follows:  CXR:   9/12  1. Interval placement of RIGHT chest tube with persistent RIGHT pneumothorax. Mild improvement. 2. Persistent bilateral  pleural effusions.     Assessment & Plan:  79 year old man with history of HFrEF and CLL s/p three cycles chemo with chronic dysphagia and on Avelox since 8/27  for PNA who presented with lethargy and poor po intake.  Found to have worsening bibasilar airspace disease and pleural effusions with hypotension despite 3.5L IVF.   Pneumonia, possible aspiration pneumonia>>>  septic shock:  --Continue antibiotics per micro flow sheet above   --Obtain sputum culture if able (previously grew klebsiella on 7/22 resistant to amp/sulbactam)  --Titrate levo gtt for MAP goal >65 and start  weaning off if feasible  --Possible UTI also >>  urine culture --Follow up Blood cultures per micro flowsheet  --SLP eval before feeding but ok for ice chips, sips  R  Pneumothorax: s/p chest tube placement on 9/12 --Chest tube to suction - 20  --Monitor CXR  Acute hypoxemic RF  - pt NCB but would agree to  Short term vent  - doing ok on 2lpm as of am 9/12   HFrEF:  With  Bilateral pleural  effusions --BiPAP prn --Can try to diurese when shock improves/resolves  AKI, metabolic acidosis without anion gap, very mild lactic acidosis: Baseline around 0.7   Lab Results  Component Value Date   CREATININE 1.32 (H) 08/27/2020   CREATININE 1.54 (H) 09/01/2020   CREATININE 0.69 07/31/2020    --Monitor BMP --Trend LA/ UOP  Acute on chronic anemia:  Lab Results  Component Value Date   HGB 8.3 (L) 08/27/2020   HGB 6.1 (LL) 09/13/2020   HGB 9.0 (L) 07/31/2020   HGB 8.6 (L) 07/28/2020   HGB 8.7 (L) 07/20/2020   HGB 8.8 (L) 06/27/2020   HGB 10.8 (L) 06/10/2020   HGB 13.2 11/05/2017   HGB 13.3 08/06/2017   HGB 12.5 (L) 05/07/2017    normal. --Monitor Hgb and transfuse prn for goal Hgb >7  Best practice:  Diet: NPO pending swallow eval Pain/Anxiety/Delirium protocol (if indicated): Monitor VAP protocol (if indicated): N/A DVT prophylaxis: SCDs GI prophylaxis: N/A Glucose control: Monitor Mobility: PT when able Code Status: DNR but intubation prn Family Communication: Discussed with patient and his wife Disposition: ICU  Labs   CBC: Recent Labs  Lab 08/17/2020 1609 08/27/20 0623  WBC 3.3* 4.4  NEUTROABS 1.1*  --   HGB 6.1* 8.3*  HCT 20.0* 26.6*  MCV 104.2* 101.1*  PLT 126* 381    Basic Metabolic Panel: Recent Labs  Lab 09/08/2020 1958 08/27/20 0623  NA 134* 129*  K 5.1 4.9  CL 108 102  CO2 19* 16*  GLUCOSE 85 79  BUN 29* 33*  CREATININE 1.54* 1.32*  CALCIUM 8.8* 8.3*  MG 1.6* 1.5*  PHOS  --  2.7   GFR: CrCl cannot be calculated (Unknown ideal  weight.). Recent Labs  Lab 08/31/2020 1609 09/07/2020 1633 09/02/2020 1958 08/27/20 0623  WBC 3.3*  --   --  4.4  LATICACIDVEN  --  2.1* 3.2*  --     Liver Function Tests: Recent Labs  Lab 09/10/2020 1958  AST 14*  ALT 9  ALKPHOS 56  BILITOT 0.2*  PROT 4.5*  ALBUMIN 1.9*   No results for input(s): LIPASE, AMYLASE in the last 168 hours. No results for input(s): AMMONIA in the last 168 hours.  ABG    Component Value Date/Time   PHART 7.491 (H) 06/07/2020 0355   PCO2ART 27.9 (L) 06/07/2020 0355   PO2ART 308 (H) 06/07/2020 0355   HCO3 21.7 06/28/2020 1325  TCO2 25 11/29/2007 1303   ACIDBASEDEF 2.7 (H) 06/28/2020 1325   O2SAT 50.0 06/28/2020 1325     Coagulation Profile: No results for input(s): INR, PROTIME in the last 168 hours.  Cardiac Enzymes: No results for input(s): CKTOTAL, CKMB, CKMBINDEX, TROPONINI in the last 168 hours.  HbA1C: Hgb A1c MFr Bld  Date/Time Value Ref Range Status  06/06/2020 09:47 AM 7.7 (H) 4.8 - 5.6 % Final    Comment:    (NOTE)         Prediabetes: 5.7 - 6.4         Diabetes: >6.4         Glycemic control for adults with diabetes: <7.0   03/20/2020 02:03 PM 4.7 4.6 - 6.5 % Final    Comment:    Glycemic Control Guidelines for People with Diabetes:Non Diabetic:  <6%Goal of Therapy: <7%Additional Action Suggested:  >8%     CBG: No results for input(s): GLUCAP in the last 168 hours.     The patient is critically ill with multiple organ systems failure and requires high complexity decision making for assessment and support, frequent evaluation and titration of therapies, application of advanced monitoring technologies and extensive interpretation of multiple databases. Critical Care Time devoted to patient care services described in this note is 60 minutes.    Christinia Gully, MD [facility] [place] [phone]   After 7:00 pm call Elink  [phone]

## 2020-08-27 NOTE — ED Notes (Signed)
Pt still has not urinated, this nurse bladder scanned pt and only 83ml showing at this time.

## 2020-08-27 NOTE — ED Notes (Signed)
Patient repositioned in bed, patient reports back pain and cough. Patient requested medication for back pain and cough. Rn explained that with his current BP that anything for pain with an opioid in it may cause a further decrease in blood pressure. Patient's family and pt reports understanding.

## 2020-08-27 NOTE — Evaluation (Signed)
Clinical/Bedside Swallow Evaluation Patient Details  Name: LORN BUTCHER MRN: [id number] Date of Birth: 12-26-1940  Today's Date: 08/27/2020 Time: SLP Start Time (ACUTE ONLY): 1555 SLP Stop Time (ACUTE ONLY): 1620 SLP Time Calculation (min) (ACUTE ONLY): 25 min  Past Medical History:  Past Medical History:  Diagnosis Date  . A-fib ([place])   . CLL (chronic lymphocytic leukemia) ([place]) dx'd 2014   progression 12/2019  . Diabetes mellitus without complication ([place])   . Diverticulosis   . External hemorrhoids   . GERD (gastroesophageal reflux disease)   . Hiatal hernia   . History of colon polyps   . Hyperlipidemia   . Hypertension   . Internal hemorrhoids   . Lymphocytosis   . Schatzki's ring   . Sleep apnea    Past Surgical History:  Past Surgical History:  Procedure Laterality Date  . BLEPHAROPLASTY    . COLONOSCOPY  2012  . FLEXIBLE SIGMOIDOSCOPY N/A 07/05/2020   Procedure: FLEXIBLE SIGMOIDOSCOPY;  Surgeon: Thornton Park, MD;  Location: [facility]COPY;  Service: Gastroenterology;  Laterality: N/A;  . HEMORRHOID SURGERY     Sclerotherapy  . KNEE SURGERY Right 2002  . SHOULDER SURGERY Right 2007   HPI:  Patient is a 79 y.o. male with PMH: HFrEF and CLL s/p three cycles chemo Avelox since 8/27 secondary to PNA,  afib not on anticoagulation, DM, HTN, GERD, OSA who presented to hospital with lethargy and poor PO intake. He was found to have worsening bibasilar airspace disease and pleural effusions with hypotension despite 3/5L IVF.   Assessment / Plan / Recommendation Clinical Impression  Patient presents with what appears to be (based on presentation, patient and wife report and history) to be a primary esophageal dysphagia with potential pharyngeal component as well. Patient did not exhbiit any overt s/s of aspiration or penetration with thin liquids or puree solids, however he did exhibit throat clearing/coughing persistently throughout session (prior to, after, during PO  intake). In addition, patient reports history of solid foods seeming to get larger the more he chews them and eventually he isnt able to swallow them. He also reports sensation of foods not going down or getting stuck in throat. Plan to allow patient to be on regular solids, thin liquids diet and for him and his wife to choose foods he can tolerate when ordering his meals while in hospital. SLP Visit Diagnosis: Dysphagia, pharyngoesophageal phase (R13.14)    Aspiration Risk  Mild aspiration risk    Diet Recommendation Regular;Thin liquid   Liquid Administration via: Cup;Straw Medication Administration: Whole meds with liquid Supervision: Patient able to self feed Compensations: Slow rate;Small sips/bites Postural Changes: Seated upright at 90 degrees;Remain upright for at least 30 minutes after po intake    Other  Recommendations Oral Care Recommendations: Oral care BID   Follow up Recommendations None      Frequency and Duration min 1 x/week  1 week       Prognosis Prognosis for Safe Diet Advancement: Fair      Swallow Study   General Date of Onset: 08/27/20 HPI: Patient is a 79 y.o. male with PMH: HFrEF and CLL s/p three cycles chemo Avelox since 8/27 secondary to PNA,  afib not on anticoagulation, DM, HTN, GERD, OSA who presented to hospital with lethargy and poor PO intake. He was found to have worsening bibasilar airspace disease and pleural effusions with hypotension despite 3/5L IVF. Type of Study: Bedside Swallow Evaluation Previous Swallow Assessment: during previous admission last month, BSE did not  reveal any significant swallow impairment Diet Prior to this Study: NPO Temperature Spikes Noted: No Respiratory Status: Room air History of Recent Intubation: No Behavior/Cognition: Alert;Pleasant mood;Cooperative Oral Cavity Assessment: Within Functional Limits Oral Care Completed by SLP: No Oral Cavity - Dentition: Adequate natural dentition Vision: Functional for  self-feeding Self-Feeding Abilities: Able to feed self Patient Positioning: Upright in bed Baseline Vocal Quality: Low vocal intensity Volitional Cough: Congested Volitional Swallow: Able to elicit    Oral/Motor/Sensory Function Overall Oral Motor/Sensory Function: Within functional limits   Ice Chips     Thin Liquid Thin Liquid: Within functional limits Presentation: Self Fed;Straw Other Comments: No overt s/s aspiration or penetration. Patient with persistent throat clearing/cough prior to, during and after PO's, which is non-productive    Nectar Thick     Honey Thick     Puree Puree: Within functional limits   Solid     Solid: Not tested      Sonia Baller, MA, [facility]

## 2020-08-27 NOTE — ED Notes (Signed)
Dr. Roxanne Mins at bedside placing a central line

## 2020-08-27 NOTE — Procedures (Signed)
Central Venous Catheter Insertion Procedure Note  Tony Marshall  [id number]  [birth year]  Date:08/27/20  Time:7:22 AM   Provider Performing:Alayjah Boehringer R Delores Thelen   Procedure: Insertion of Non-tunneled Central Venous [phone]) with US guidance ([id number])   Indication(s) Medication administration  Consent Risks of the procedure as well as the alternatives and risks of each were explained to the patient and/or caregiver.  Consent for the procedure was obtained and is signed in the bedside chart  Anesthesia Topical only with 1% lidocaine   Timeout Verified patient identification, verified procedure, site/side was marked, verified correct patient position, special equipment/implants available, medications/allergies/relevant history reviewed, required imaging and test results available.  Sterile Technique Maximal sterile technique including full sterile barrier drape, hand hygiene, sterile gown, sterile gloves, mask, hair covering, sterile ultrasound probe cover (if used).  Procedure Description Area of catheter insertion was cleaned with chlorhexidine and draped in sterile fashion.  With real-time ultrasound guidance a central venous catheter was placed into the left femoral vein. Nonpulsatile blood flow and easy flushing noted in all ports.  The catheter was sutured in place and sterile dressing applied.  Complications/Tolerance None; patient tolerated the procedure well. Chest X-ray is ordered to verify placement for internal jugular or subclavian cannulation.   Chest x-ray is not ordered for femoral cannulation.  EBL Minimal  Specimen(s) None   Otilio Carpen Kahiau Schewe, PA-C

## 2020-08-27 NOTE — ED Notes (Signed)
[facility] notified due to pt need for central line. Pt has reached max dose of norepinephrine for peripheral line. Spoke with Dr. Corinna Lines and he is putting in a consult with critical care for a central line.

## 2020-08-28 ENCOUNTER — Inpatient Hospital Stay: Payer: Medicare HMO | Admitting: Medical

## 2020-08-28 ENCOUNTER — Inpatient Hospital Stay (HOSPITAL_COMMUNITY): Payer: Medicare HMO

## 2020-08-28 ENCOUNTER — Inpatient Hospital Stay: Payer: Medicare HMO

## 2020-08-28 LAB — CBC
HCT: 33.9 % — ABNORMAL LOW (ref 39.0–52.0)
Hemoglobin: 11.1 g/dL — ABNORMAL LOW (ref 13.0–17.0)
MCH: 29.2 pg (ref 26.0–34.0)
MCHC: 32.7 g/dL (ref 30.0–36.0)
MCV: 89.2 fL (ref 80.0–100.0)
Platelets: 140 10*3/uL — ABNORMAL LOW (ref 150–400)
RBC: 3.8 MIL/uL — ABNORMAL LOW (ref 4.22–5.81)
RDW: 22.3 % — ABNORMAL HIGH (ref 11.5–15.5)
WBC: 8.2 10*3/uL (ref 4.0–10.5)
nRBC: 0 % (ref 0.0–0.2)

## 2020-08-28 LAB — URINE CULTURE: Culture: <10000 — AB

## 2020-08-28 LAB — HEMOGLOBIN A1C
Hgb A1c MFr Bld: 5.8 % — ABNORMAL HIGH (ref 4.8–5.6)
Mean Plasma Glucose: 119.76 mg/dL

## 2020-08-28 LAB — BASIC METABOLIC PANEL
Anion gap: 12 (ref 5–15)
BUN: 40 mg/dL — ABNORMAL HIGH (ref 8–23)
CO2: 14 mmol/L — ABNORMAL LOW (ref 22–32)
Calcium: 8.2 mg/dL — ABNORMAL LOW (ref 8.9–10.3)
Chloride: 104 mmol/L (ref 98–111)
Creatinine, Ser: 1.16 mg/dL (ref 0.61–1.24)
GFR calc Af Amer: >60 mL/min (ref >60)
GFR calc non Af Amer: >60 mL/min (ref >60)
Glucose, Bld: 223 mg/dL — ABNORMAL HIGH (ref 70–99)
Potassium: 4.7 mmol/L (ref 3.5–5.1)
Sodium: 130 mmol/L — ABNORMAL LOW (ref 135–145)

## 2020-08-28 LAB — STREP PNEUMONIAE URINARY ANTIGEN: Strep Pneumo Urinary Antigen: NEGATIVE

## 2020-08-28 LAB — TYPE AND SCREEN
ABO/RH(D): A POS
Antibody Screen: POSITIVE
DAT, IgG: POSITIVE
Unit division: 0
Unit division: 0

## 2020-08-28 LAB — CORTISOL: Cortisol, Plasma: 53.8 ug/dL

## 2020-08-28 LAB — GLUCOSE, CAPILLARY
Glucose-Capillary: 190 mg/dL — ABNORMAL HIGH (ref 70–99)
Glucose-Capillary: 202 mg/dL — ABNORMAL HIGH (ref 70–99)
Glucose-Capillary: 241 mg/dL — ABNORMAL HIGH (ref 70–99)
Glucose-Capillary: 233 mg/dL — ABNORMAL HIGH (ref 70–99)
Glucose-Capillary: 288 mg/dL — ABNORMAL HIGH (ref 70–99)
Glucose-Capillary: 331 mg/dL — ABNORMAL HIGH (ref 70–99)
Glucose-Capillary: 274 mg/dL — ABNORMAL HIGH (ref 70–99)
Glucose-Capillary: 347 mg/dL — ABNORMAL HIGH (ref 70–99)

## 2020-08-28 LAB — BPAM RBC
Blood Product Expiration Date: 202110162359
Blood Product Expiration Date: 202110192359
ISSUE DATE / TIME: 202109121359
ISSUE DATE / TIME: 202109121632
Unit Type and Rh: 6200
Unit Type and Rh: 6200

## 2020-08-28 LAB — EXPECTORATED SPUTUM ASSESSMENT W GRAM STAIN, RFLX TO RESP C

## 2020-08-28 LAB — MAGNESIUM: Magnesium: 2.6 mg/dL — ABNORMAL HIGH (ref 1.7–2.4)

## 2020-08-28 MED ORDER — KATE FARMS STANDARD 1.4 PO LIQD
325.0000 mL | Freq: Two times a day (BID) | ORAL | Status: DC
  Administered 2020-08-31: 325 mL via ORAL
  Filled 2020-08-28 (×8): qty 325

## 2020-08-28 MED ORDER — INSULIN ASPART 100 UNIT/ML ~~LOC~~ SOLN
0.0000 [IU] | SUBCUTANEOUS | Status: DC
  Administered 2020-08-28 – 2020-08-29 (×2): 5 [IU] via SUBCUTANEOUS
  Administered 2020-08-29: 7 [IU] via SUBCUTANEOUS
  Administered 2020-08-29 (×2): 2 [IU] via SUBCUTANEOUS
  Administered 2020-08-30 (×2): 1 [IU] via SUBCUTANEOUS
  Administered 2020-08-31: 2 [IU] via SUBCUTANEOUS
  Administered 2020-08-31 (×2): 3 [IU] via SUBCUTANEOUS
  Administered 2020-08-31: 1 [IU] via SUBCUTANEOUS
  Administered 2020-08-31: 2 [IU] via SUBCUTANEOUS
  Administered 2020-09-01: 3 [IU] via SUBCUTANEOUS
  Administered 2020-09-01: 7 [IU] via SUBCUTANEOUS

## 2020-08-28 MED ORDER — ADULT MULTIVITAMIN W/MINERALS CH
1.0000 | ORAL_TABLET | Freq: Every day | ORAL | Status: DC
  Administered 2020-08-28 – 2020-08-29 (×2): 1 via ORAL
  Filled 2020-08-28 (×2): qty 1

## 2020-08-28 MED ORDER — BENZONATATE 100 MG PO CAPS
100.0000 mg | ORAL_CAPSULE | Freq: Three times a day (TID) | ORAL | Status: DC | PRN
  Administered 2020-08-28 – 2020-09-09 (×10): 100 mg via ORAL
  Filled 2020-08-28 (×10): qty 1

## 2020-08-28 MED ORDER — PROSOURCE PLUS PO LIQD
30.0000 mL | Freq: Two times a day (BID) | ORAL | Status: DC
  Administered 2020-08-29 (×2): 30 mL via ORAL
  Filled 2020-08-28 (×2): qty 30

## 2020-08-28 NOTE — Progress Notes (Signed)
NAME:  Tony Marshall, MRN:  [id number], DOB:  [birth year], LOS: 1 ADMISSION DATE:  08/25/2020, CONSULTATION DATE:  08/28/20 REFERRING MD:  EDP, CHIEF COMPLAINT:  lethargy  Brief History   45 yowm never smoker with history of HFrEF and CLL s/p three cycles chemo and rx avelox since 8/27    for PNA who presented with lethargy and poor po intake.  Found to have worsening bibasilar airspace disease and pleural effusions with hypotension despite 3.5L IVF. PCCM consulted for admission.  History of present illness   79 year old man with history of Atrial fibrillation not on anticoagulation, HFrEF, CLL s/p 3 cycles chemo, DM, HTN with recent PNA on 8/27 and has had 2 courses of antibiotics who was brought in for poor po intake and lethargy.  He is accompanied by his wife.  He has been feeling poorly for the last several weeks.  He feels some shortness of breath but denies chest pain.  Denies fevers or chills.  He has been coughing with mucus production that is green.  He does report sometimes choking on/coughing with food intake.  CXR significant with bilateral pleural effusions and worsenining bibasilar airspace disease.  He was placed on Bipap and given 3.5L IVF with Vanc and zosyn.  Labs were significant for WBC 3.3, Hgb 6.1 down from baseline of 9 several weeks ago, Lactic acid 3.2, Covid-19 negative.  Despite IVF he remained hypotensive, so vasopressors were initiated and PCCM consulted for admission  Past Medical History  Atrial fibrillation not on anticoagulation  HFrEF CLL s/p 3 cycles chemo DM HTN GERD  OSA   Significant Hospital Events   9/11 Admit to PCCM  Consults:  None  Procedures:  9/12 RIJ attempted, R pigtail chest tube by ED 9/12 L Fem CVL  Significant Diagnostic Tests:       Micro Data:  Sars-Cov-2 PCR 9/11 >>negative Sputum culture  9/12 >>> Blood Cx  9/12>>> Urine Cx  9/12>>>  Antimicrobials:  Zozyn 9/11 only Vancomycin 9/11> Cefepime 9/12>  Scheduled  Meds: . Chlorhexidine Gluconate Cloth  6 each Topical Daily  . Chlorhexidine Gluconate Cloth  6 each Topical Daily  . mouth rinse  15 mL Mouth Rinse BID  . pantoprazole (PROTONIX) IV  40 mg Intravenous Q24H  . sodium chloride flush  10-40 mL Intracatheter Q12H   Continuous Infusions: . ceFEPime (MAXIPIME) IV Stopped (08/28/20 0152)  . norepinephrine (LEVOPHED) Adult infusion 6 mcg/min (08/28/20 0925)  . vancomycin Stopped (08/28/20 0000)   PRN Meds:.ALPRAZolam, docusate sodium, guaiFENesin-dextromethorphan, polyethylene glycol, sodium chloride flush   Interim history/subjective:  No acute events overnight. Chest tube set to -40. 763mL of clear yellow fluid has drained from chest tube. Patient complains of cough this morning.  Objective   Blood pressure (!) 100/57, pulse 65, temperature (!) 97.5 F (36.4 C), temperature source Oral, resp. rate 18, height 5' 7.5" (1.715 m), weight 52.3 kg, SpO2 95 %.        Intake/Output Summary (Last 24 hours) at 08/28/2020 1022 Last data filed at 08/28/2020 0600 Gross per 24 hour  Intake 2183.8 ml  Output 1350 ml  Net 833.8 ml   Exam: Gen: chronically ill appearing, thin, no acute distress HEENT: EOMI, sclera anicteric Lungs: Clear to auscultation bilaterally; normal respiratory effort CV: Regular rate and rhythm; no murmurs Abd: minimal bowel sounds; soft, non-distended, non-tender IPJ:ASNKN edema bilaterally Skin: Warm and dry; no rash Neuro:   alert, awake, moves all extremities  I personally reviewed images and agree with  radiology impression as follows:  CXR:   9/13  - improved right pneumothorax at base, small to moderate pneumothorax remains at right apex - Left pleural effusion  Assessment & Plan:  79 year old man with history of HFrEF and CLL s/p three cycles chemo with chronic dysphagia and on Avelox since 8/27  for PNA who presented with lethargy and poor po intake.  Found to have worsening  bibasilar airspace disease and pleural effusions with hypotension despite 3.5L IVF.   Shock Likely septic in nature with concern for pneumonia and hypovolemic due to poor oral intake - Follow up cultures - Treating with vancomycin and cefepime - Check urine legionella and strep pneumo ag - Wean levophed, MAP goal 65 or greater, will perform bedside US to help determine fluid status - Check random cortisol  Pneumonia, possible aspiration pneumonia - Covering with cefepime and vancomycin - MRSA screen negative. - Awaiting sputum culture to help direct antibiotics - Hx of klebsiella in sputum -SLP eval before feeding but ok for ice chips, sips  R  Pneumothorax: s/p chest tube placement on 9/12 - Improving -Chest tube to suction - 20  -Monitor CXR  HFrEF:  With  Bilateral pleural  effusions --BiPAP prn --Can try to diurese when shock improves/resolves  AKI, metabolic acidosis without anion gap, very mild lactic acidosis: Baseline around 0.7, admission Cr 1.54 - Cr improving 1.16 today - Continue to monitor  Best practice:  Diet: NPO pending swallow eval Pain/Anxiety/Delirium protocol (if indicated): Monitor VAP protocol (if indicated): N/A DVT prophylaxis: SCDs GI prophylaxis: N/A Glucose control: Monitor Mobility: PT when able Code Status: DNR but intubation prn Family Communication: Discussed with patient and his wife Disposition: ICU  Labs   CBC: Recent Labs  Lab 08/18/2020 1609 08/27/20 0623 08/27/20 1144 08/27/20 2139 08/28/20 0311  WBC 3.3* 4.4 6.1  --  8.2  NEUTROABS 1.1*  --   --   --   --   HGB 6.1* 8.3* 6.7* 11.0* 11.1*  HCT 20.0* 26.6* 21.1* 33.9* 33.9*  MCV 104.2* 101.1* 97.7  --  89.2  PLT 126* 151 131*  --  140*    Basic Metabolic Panel: Recent Labs  Lab 09/01/2020 1958 08/27/20 0623 08/28/20 0311  NA 134* 129* 130*  K 5.1 4.9 4.7  CL 108 102 104  CO2 19* 16* 14*  GLUCOSE 85 79 223*  BUN 29* 33* 40*  CREATININE 1.54* 1.32* 1.16  CALCIUM  8.8* 8.3* 8.2*  MG 1.6* 1.5* 2.6*  PHOS  --  2.7  --    GFR: Estimated Creatinine Clearance: 40.1 mL/min (by C-G formula based on SCr of 1.16 mg/dL). Recent Labs  Lab 08/30/2020 1609 09/08/2020 1633 09/12/2020 1958 08/27/20 0623 08/27/20 1144 08/28/20 0311  WBC 3.3*  --   --  4.4 6.1 8.2  LATICACIDVEN  --  2.1* 3.2*  --  1.5  --     Liver Function Tests: Recent Labs  Lab 08/31/2020 1958  AST 14*  ALT 9  ALKPHOS 56  BILITOT 0.2*  PROT 4.5*  ALBUMIN 1.9*   No results for input(s): LIPASE, AMYLASE in the last 168 hours. No results for input(s): AMMONIA in the last 168 hours.  ABG    Component Value Date/Time   PHART 7.491 (H) 06/07/2020 0355   PCO2ART 27.9 (L) 06/07/2020 0355   PO2ART 308 (H) 06/07/2020 0355   HCO3 21.7 06/28/2020 1325   TCO2 25 11/29/2007 1303   ACIDBASEDEF 2.7 (H) 06/28/2020 1325   O2SAT 50.0  06/28/2020 1325     Coagulation Profile: No results for input(s): INR, PROTIME in the last 168 hours.  Cardiac Enzymes: No results for input(s): CKTOTAL, CKMB, CKMBINDEX, TROPONINI in the last 168 hours.  HbA1C: Hgb A1c MFr Bld  Date/Time Value Ref Range Status  06/06/2020 09:47 AM 7.7 (H) 4.8 - 5.6 % Final    Comment:    (NOTE)         Prediabetes: 5.7 - 6.4         Diabetes: >6.4         Glycemic control for adults with diabetes: <7.0   03/20/2020 02:03 PM 4.7 4.6 - 6.5 % Final    Comment:    Glycemic Control Guidelines for People with Diabetes:Non Diabetic:  <6%Goal of Therapy: <7%Additional Action Suggested:  >8%     CBG: Recent Labs  Lab 08/27/20 1612 08/27/20 1941 08/27/20 2336 08/28/20 0426 08/28/20 0842  GLUCAP 103* 149* 190* 202* 241*    The patient is critically ill with multiple organ systems failure and requires high complexity decision making for assessment and support, frequent evaluation and titration of therapies, application of advanced monitoring technologies and extensive interpretation of multiple databases. Critical Care  Time devoted to patient care services described in this note is 45 minutes.   Freda Jackson, MD [facility] Office: [phone]  See Amion for Pager Details

## 2020-08-28 NOTE — Progress Notes (Signed)
[facility] Progress Note Patient Name: Tony Marshall DOB: March 21, 1941 MRN: [id number]   Date of Service  08/28/2020  HPI/Events of Note  Hyperglycemia - Blood glucose = 274. On diet, but not eating well.   eICU Interventions  Plan: 1. Q 4 hour sensitive Novolog SSI.      Intervention Category Major Interventions: Hyperglycemia - active titration of insulin therapy  Katlyne Nishida Eugene 08/28/2020, 9:30 PM

## 2020-08-28 NOTE — Progress Notes (Signed)
eLink Physician-Brief Progress Note Patient Name: Tony Marshall DOB: Sep 29, 1941 MRN: [id number]   Date of Service  08/28/2020  HPI/Events of Note  CXR this AM reveals 1. Moderate right pneumothorax with interval increase at the base where there may have been interval evacuation of pleural fluid. At the apex, thickness is unchanged at 3 cm. 2. Bilateral lower lobe opacity and left pleural effusion. Patient on room air with sat = 96% and RR = 19.   eICU Interventions  Plan: 1. Increase Pleuravac to - 40 suction. 2. Repeat CXR at 10 AM.      Intervention Category Major Interventions: Other:  Tony Marshall Cornelia Copa 08/28/2020, 6:18 AM

## 2020-08-28 NOTE — Progress Notes (Signed)
Speech Language Pathology Treatment: Dysphagia  Patient Details Name: Tony Marshall MRN: [id number] DOB: 1941-01-28 Today's Date: 08/28/2020 Time: 0272-5366 SLP Time Calculation (min) (ACUTE ONLY): 29 min  Assessment / Plan / Recommendation Clinical Impression  Pt seen today for follow up re: his swallowing function, dysphagia mitigation strategies, and education.  He was awake in bed, watching television and willing to participate. SLP obtained assist to slide him up in bed and assisted pt with dental brushing - providing oral care - which he stated was very helpful.  Oral cavity appeared clear with no exudate or erythema.  Reviewed importance of oral care at least BID - prefer QID for pulmonary health.  Breakfast arrived and SLP observed pt with his minimal consumption including orange juice, 1/3 banana and 1/3 rice krispies with milk.  Mastication appeared prolonged but adequate with minimal oral retention on right more than left x1.  Mod I to assure adequate oral clearance when finished with breakfast as she cleared independently.  Poor intake noted - with no indication of aspiration apparent.     Pt appears with flat affect but uncertain to his baseline.  Pt reports his swallowing is "fine" but he reports nothing "tastes good". Pt did not reiterate food getting "bigger" as reported to prior SLP.  He reports he does consume Ensure at home BID * likes Chocolate*.     Recommend dietician consult to maximize pt's intake/nutrition and consider lifiting heart healthy restrictions if medically feasible. Educated pt to findings/recommendations using teach back and provided written instructions for pt along with posting duplicate at [facility].  No SLP follow up indicated as pt without indications of aspiration and uses caution with po appropriately.  Thanks for allowing this SLP to help with this pt's care plan.  HPI   Patient is a 79 y.o. male with PMH: HFrEF and CLL s/p three cycles chemo Avelox since  8/27 secondary to PNA,  afib not on anticoagulation, DM, HTN, GERD, OSA who presented to hospital with lethargy and poor PO intake. He was found to have worsening bibasilar airspace disease and pleural effusions with hypotension despite 3/5L IVF.  Pt required bipap in hospital and found to have pneumothorax, s/p chest tube placement.  Today chest xray showing decrease in right pneumothorax.  Pt reports his swallowing is "fine" but he reports nothing "tastes good". He does consume Ensure at home BID * likes Chocolate*.     SLP Plan  All goals met       Recommendations  Diet recommendations: Regular;Thin liquid Liquids provided via: Straw Medication Administration: Whole meds with puree Compensations: Slow rate;Small sips/bites Postural Changes and/or Swallow Maneuvers: Seated upright 90 degrees;Upright 30-60 min after meal                Oral Care Recommendations: Oral care BID Follow up Recommendations: None SLP Visit Diagnosis: Dysphagia, unspecified (R13.10) Plan: All goals met       GO                [name] 08/28/2020, 10:37 AM  Kathleen Lime, MS [place] Office [phone]

## 2020-08-28 NOTE — Progress Notes (Signed)
Initial Nutrition Assessment  DOCUMENTATION CODES:   Severe malnutrition in context of chronic illness, Underweight  INTERVENTION:  - will order [employer] 1.4 po BID, each supplement provides 455 kcal and 20 grams protein. - will order Magic Cup with lunch meals, each supplement provides 290 kcal and 9 grams of protein. - will order 30 mL Prosource Plus BID, each supplement provides 100 kcal and 15 grams of protein. - will order 1 tablet multivitamin with minerals/day.  NUTRITION DIAGNOSIS:   Severe Malnutrition related to chronic illness, cancer and cancer related treatments as evidenced by moderate fat depletion, moderate muscle depletion, severe fat depletion, severe muscle depletion.  GOAL:   Patient will meet greater than or equal to 90% of their needs  MONITOR:   PO intake, Supplement acceptance, Labs, Weight trends  REASON FOR ASSESSMENT:   Malnutrition Screening Tool  ASSESSMENT:   79 year old man with medical history of afib not on anticoagulation, HFrEF, CLL s/p 3 cycles chemo, DM, HTN, recent PNA on 8/27 and had 2 courses of abx. He presented to the ED due to poor PO intake, shortness of breath, and lethargy. He has had a productive cough.  Patient consumed 25% of breakfast this AM. SLP worked with patient during breakfast and cleared him for regular, thin liquid items although note placed in diet order for patient and wife to order foods that patient can tolerate.   Patient laying in bed with eyes closed but awoke to name call x1. No family/visitors present. Discussed patient in rounds this AM.   He denies any chewing or swallowing issues and never feels that items get stuck in his throat or that items take a prolonged time to chew; SLP note indicated patient with prolonged mastication of items at breakfast.  He reports poor appetite and taste changes (bland taste, "off" taste) for a prolonged but unknown amount of time. Of note, he previously was on 2 rounds of abx  and is undergoing chemo treatment for CLL.   He denies pain or other discomfort with chewing or swallowing and no nausea with PO intakes. States that lack of appetite has led to poor oral intakes and that the amount consumed at breakfast today is more than he has eaten at a meal for awhile.   Patient is unsure of UBW or how much weight he may have lost in the past few months. Per chart review, weight yesterday was 115 lb and weight on 7/24 was 122 lb. This indicates 7 lb weight loss (5.7% body weight) in ~6 weeks. Weight on 6/15 was 123 lb which indicates 8 lb weight loss (6.5% body weight) in the past 3 months.     Labs reviewed; CBGs: 202, 241, 233 mg/dl, Na: 130 mmol/l, BUN: 40 mg/dl, Ca: 8.2 mg/dl, Mg: 2.6 mg/dl. Medications reviewed; 40 mg IV protonix/day.     NUTRITION - FOCUSED PHYSICAL EXAM:    Most Recent Value  Orbital Region Moderate depletion  Upper Arm Region Severe depletion  Thoracic and Lumbar Region Severe depletion  Buccal Region Moderate depletion  Temple Region Moderate depletion  Clavicle Bone Region Severe depletion  Clavicle and Acromion Bone Region Severe depletion  Scapular Bone Region Severe depletion  Dorsal Hand Severe depletion  Patellar Region Severe depletion  Anterior Thigh Region Moderate depletion  Posterior Calf Region Moderate depletion  Edema (RD Assessment) Moderate  [BLE]  Hair Reviewed  Eyes Reviewed  Mouth Reviewed  Skin Reviewed  Nails Reviewed       Diet Order:  Diet Order            Diet regular Room service appropriate? Yes; Fluid consistency: Thin  Diet effective now                 EDUCATION NEEDS:   No education needs have been identified at this time  Skin:  Skin Assessment: Skin Integrity Issues: Skin Integrity Issues:: DTI DTI: sacrum and L side of nose  Last BM:  9/11 (day PTA)  Height:   Ht Readings from Last 1 Encounters:  08/27/20 5' 7.5" (1.715 m)    Weight:   Wt Readings from Last 1 Encounters:    08/27/20 52.3 kg     Estimated Nutritional Needs:  Kcal:  1700-1900 kcal Protein:  90-100 grams Fluid:  >/= 1.8 L/day     Jarome Matin, MS, RD, LDN, CNSC Inpatient Clinical Dietitian RD pager # available in AMION  After hours/weekend pager # available in [facility]

## 2020-08-29 ENCOUNTER — Inpatient Hospital Stay (HOSPITAL_COMMUNITY): Payer: Medicare HMO

## 2020-08-29 LAB — GLUCOSE, CAPILLARY
Glucose-Capillary: 104 mg/dL — ABNORMAL HIGH (ref 70–99)
Glucose-Capillary: 177 mg/dL — ABNORMAL HIGH (ref 70–99)
Glucose-Capillary: 183 mg/dL — ABNORMAL HIGH (ref 70–99)
Glucose-Capillary: 270 mg/dL — ABNORMAL HIGH (ref 70–99)
Glucose-Capillary: 336 mg/dL — ABNORMAL HIGH (ref 70–99)
Glucose-Capillary: 64 mg/dL — ABNORMAL LOW (ref 70–99)
Glucose-Capillary: 70 mg/dL (ref 70–99)

## 2020-08-29 LAB — CBC
HCT: 33.4 % — ABNORMAL LOW (ref 39.0–52.0)
Hemoglobin: 11.1 g/dL — ABNORMAL LOW (ref 13.0–17.0)
MCH: 28.7 pg (ref 26.0–34.0)
MCHC: 33.2 g/dL (ref 30.0–36.0)
MCV: 86.3 fL (ref 80.0–100.0)
Platelets: 117 10*3/uL — ABNORMAL LOW (ref 150–400)
RBC: 3.87 MIL/uL — ABNORMAL LOW (ref 4.22–5.81)
RDW: 22.8 % — ABNORMAL HIGH (ref 11.5–15.5)
WBC: 8.6 10*3/uL (ref 4.0–10.5)
nRBC: 0 % (ref 0.0–0.2)

## 2020-08-29 LAB — LEGIONELLA PNEUMOPHILA SEROGP 1 UR AG: L. pneumophila Serogp 1 Ur Ag: NEGATIVE

## 2020-08-29 LAB — BASIC METABOLIC PANEL
Anion gap: 11 (ref 5–15)
BUN: 50 mg/dL — ABNORMAL HIGH (ref 8–23)
CO2: 16 mmol/L — ABNORMAL LOW (ref 22–32)
Calcium: 8.3 mg/dL — ABNORMAL LOW (ref 8.9–10.3)
Chloride: 106 mmol/L (ref 98–111)
Creatinine, Ser: 1.34 mg/dL — ABNORMAL HIGH (ref 0.61–1.24)
GFR calc Af Amer: 59 mL/min — ABNORMAL LOW (ref >60)
GFR calc non Af Amer: 51 mL/min — ABNORMAL LOW (ref >60)
Glucose, Bld: 184 mg/dL — ABNORMAL HIGH (ref 70–99)
Potassium: 3.9 mmol/L (ref 3.5–5.1)
Sodium: 133 mmol/L — ABNORMAL LOW (ref 135–145)

## 2020-08-29 LAB — MAGNESIUM: Magnesium: 2.5 mg/dL — ABNORMAL HIGH (ref 1.7–2.4)

## 2020-08-29 LAB — PHOSPHORUS: Phosphorus: 2.4 mg/dL — ABNORMAL LOW (ref 2.5–4.6)

## 2020-08-29 LAB — OCCULT BLOOD X 1 CARD TO LAB, STOOL: Fecal Occult Bld: NEGATIVE

## 2020-08-29 MED ORDER — MELATONIN 3 MG PO TABS
3.0000 mg | ORAL_TABLET | Freq: Once | ORAL | Status: AC
  Administered 2020-08-29: 3 mg via ORAL
  Filled 2020-08-29: qty 1

## 2020-08-29 MED ORDER — DEXTROSE 5 % IV SOLN: INTRAVENOUS | Status: AC

## 2020-08-29 MED ORDER — INSULIN GLARGINE 100 UNIT/ML ~~LOC~~ SOLN: 6.0000 [IU] | Freq: Every day | SUBCUTANEOUS | Status: DC

## 2020-08-29 MED ORDER — DIPHENHYDRAMINE HCL 50 MG/ML IJ SOLN
25.0000 mg | Freq: Once | INTRAMUSCULAR | Status: AC
  Administered 2020-08-29: 25 mg via INTRAVENOUS
  Filled 2020-08-29: qty 1

## 2020-08-29 MED ORDER — ALBUMIN HUMAN 25 % IV SOLN
12.5000 g | Freq: Three times a day (TID) | INTRAVENOUS | Status: AC
  Administered 2020-08-29 – 2020-08-30 (×3): 12.5 g via INTRAVENOUS
  Filled 2020-08-29 (×4): qty 50

## 2020-08-29 MED ORDER — DRONABINOL 5 MG PO CAPS
5.0000 mg | ORAL_CAPSULE | Freq: Two times a day (BID) | ORAL | Status: DC
  Administered 2020-08-29: 5 mg via ORAL
  Filled 2020-08-29: qty 1

## 2020-08-29 MED ORDER — INSULIN GLARGINE 100 UNIT/ML ~~LOC~~ SOLN
10.0000 [IU] | Freq: Every day | SUBCUTANEOUS | Status: DC
  Administered 2020-08-29: 10 [IU] via SUBCUTANEOUS
  Filled 2020-08-29 (×2): qty 0.1

## 2020-08-29 MED ORDER — ENSURE MAX PROTEIN PO LIQD: 11.0000 [oz_av] | Freq: Two times a day (BID) | ORAL | Status: DC

## 2020-08-29 NOTE — Progress Notes (Signed)
[place] Progress Note Patient Name: Tony Marshall DOB: September 10, 1941 MRN: [id number]   Date of Service  08/29/2020  HPI/Events of Note  Xanax not effective for sleep.   eICU Interventions  Plan: 1. Melatonin 3 mg PO x 1 now.      Intervention Category Major Interventions: Other:  Lysle Dingwall 08/29/2020, 1:40 AM

## 2020-08-29 NOTE — Progress Notes (Signed)
Hypoglycemic Event  CBG: 64  Treatment: 6 oz of Orange Juice    Symptoms: None  Follow-up CBG: Time: 2045 CBG Result: 70  Possible Reasons for Event:  No Appetite   Comments/MD notified: E-link notified     Tony Marshall

## 2020-08-29 NOTE — Progress Notes (Signed)
Patient had no urine output over shift, bladder scan showed 549ml. Contacted ELink for In and Out order. Tried twice but unsuccessful. ELink was informed and will put in urology consult in the morning.

## 2020-08-29 NOTE — Progress Notes (Signed)
NAME:  Tony Marshall, MRN:  [id number], DOB:  02/13/1941, LOS: 2 ADMISSION DATE:  08/18/2020, CONSULTATION DATE:  08/29/20 REFERRING MD:  EDP, CHIEF COMPLAINT:  lethargy  Brief History   49 yowm never smoker with history of HFrEF and CLL s/p three cycles chemo and rx avelox since 8/27    for PNA who presented with lethargy and poor po intake.  Found to have worsening bibasilar airspace disease and pleural effusions with hypotension despite 3.5L IVF. PCCM consulted for admission.  History of present illness   79 year old man with history of Atrial fibrillation not on anticoagulation, HFrEF, CLL s/p 3 cycles chemo, DM, HTN with recent PNA on 8/27 and has had 2 courses of antibiotics who was brought in for poor po intake and lethargy.  He is accompanied by his wife.  He has been feeling poorly for the last several weeks.  He feels some shortness of breath but denies chest pain.  Denies fevers or chills.  He has been coughing with mucus production that is green.  He does report sometimes choking on/coughing with food intake.  CXR significant with bilateral pleural effusions and worsenining bibasilar airspace disease.  He was placed on Bipap and given 3.5L IVF with Vanc and zosyn.  Labs were significant for WBC 3.3, Hgb 6.1 down from baseline of 9 several weeks ago, Lactic acid 3.2, Covid-19 negative.  Despite IVF he remained hypotensive, so vasopressors were initiated and PCCM consulted for admission  Past Medical History  Atrial fibrillation not on anticoagulation  HFrEF CLL s/p 3 cycles chemo DM HTN GERD  OSA  Significant Hospital Events   9/11 Admit to PCCM  Consults:  None  Procedures:  9/12 RIJ attempted, R pigtail chest tube by ED 9/12 L Fem CVL  Significant Diagnostic Tests:     Micro Data:  Sars-Cov-2 PCR 9/11 >negative Sputum culture  9/12 > Blood Cx  9/12> Urine Cx  9/12>  Antimicrobials:  Zozyn 9/11 only Vancomycin 9/11> Cefepime 9/12>  Interim  history/subjective:  No acute events overnight. No air leak noted from the chest tube this morning. CXR pending. Patient without complaints. Per nursing patient did not void all night and multiple attempts at foley catheter were made. Call placed to urology for difficult foley placement. However, patient has voided on his own this morning.   Objective   Blood pressure (!) 153/81, pulse 62, temperature 97.6 F (36.4 C), temperature source Oral, resp. rate 14, height 5' 7.5" (1.715 m), weight 54.5 kg, SpO2 100 %.        Intake/Output Summary (Last 24 hours) at 08/29/2020 0813 Last data filed at 08/29/2020 0630 Gross per 24 hour  Intake 879.42 ml  Output 650 ml  Net 229.42 ml   Exam: Gen: chronically ill appearing, thin, no acute distress HEENT: EOMI, sclera anicteric Lungs: Clear to auscultation bilaterally; normal respiratory effort CV: Regular rate and rhythm; no murmurs Abd: minimal bowel sounds; soft, non-distended, non-tender LXB:WIOMB edema bilaterally Skin: Warm and dry; no rash Neuro:   alert, awake, moves all extremities  I personally reviewed images and agree with radiology impression as follows:  CXR:   9/13  - improved right pneumothorax at base, small to moderate pneumothorax remains at right apex - Left pleural effusion  Assessment & Plan:  79 year old man with history of HFrEF and CLL s/p three cycles chemo with chronic dysphagia and on Avelox since 8/27  for PNA who presented with lethargy and poor po intake.  Found to have  worsening bibasilar airspace disease and pleural effusions with hypotension despite 3.5L IVF.   Shock Likely septic in nature with concern for pneumonia and hypovolemic due to poor oral intake - Follow up cultures - Treating with vancomycin and cefepime - Urine strep pna ag negative - Wean levophed, MAP goal 65 or greater - Random cortisol in appropriate range, less concern for adrenal  insufficiency  Pneumonia, possible aspiration pneumonia - Covering with cefepime and vancomycin - MRSA screen negative. - Sputum culture indeterminate as it was more saliva - Hx of klebsiella in sputum - No concern for aspiration on speech evaluation  R  Pneumothorax: s/p chest tube placement on 9/12 - Improving, will follow up today's cxr -Chest tube to suction - 20  -Monitor CXR  HFrEF:  With  Bilateral pleural  effusions --BiPAP prn --Can try to diurese when shock improves/resolves  AKI, metabolic acidosis without anion gap, very mild lactic acidosis: Baseline around 0.7, admission Cr 1.54 - Continue to monitor - Pending labs from today but Cr has improved since admission  Protein Malnutrition/Anorexia - Appreciate nutrition recommendations - Supplements ordered per their recommendations - Will start dronabinol 5mg  BID today  Depressed Affect -Patient may benefit from antidepressant therapy such as mirtazipine but will hold off on this as he is starting other medications for appetite stimulant  Diabetes Mellitus - SSI - Start 10 units lantus daily  - Will monitor now that diet has been resumed  Best practice:  Diet: Regular Pain/Anxiety/Delirium protocol (if indicated): Monitor VAP protocol (if indicated): N/A DVT prophylaxis: SCDs GI prophylaxis: N/A Glucose control: Monitor Mobility: PT when able Code Status: DNR but intubation prn Family Communication: Discussed with patient and his wife Disposition: ICU  Labs   CBC: Recent Labs  Lab 08/24/2020 1609 08/27/20 0623 08/27/20 1144 08/27/20 2139 08/28/20 0311  WBC 3.3* 4.4 6.1  --  8.2  NEUTROABS 1.1*  --   --   --   --   HGB 6.1* 8.3* 6.7* 11.0* 11.1*  HCT 20.0* 26.6* 21.1* 33.9* 33.9*  MCV 104.2* 101.1* 97.7  --  89.2  PLT 126* 151 131*  --  140*    Basic Metabolic Panel: Recent Labs  Lab 09/11/2020 1958 08/27/20 0623 08/28/20 0311  NA 134* 129* 130*  K 5.1 4.9 4.7  CL 108 102 104  CO2 19* 16*  14*  GLUCOSE 85 79 223*  BUN 29* 33* 40*  CREATININE 1.54* 1.32* 1.16  CALCIUM 8.8* 8.3* 8.2*  MG 1.6* 1.5* 2.6*  PHOS  --  2.7  --    GFR: Estimated Creatinine Clearance: 41.8 mL/min (by C-G formula based on SCr of 1.16 mg/dL). Recent Labs  Lab 08/20/2020 1609 09/10/2020 1633 08/25/2020 1958 08/27/20 0623 08/27/20 1144 08/28/20 0311  WBC 3.3*  --   --  4.4 6.1 8.2  LATICACIDVEN  --  2.1* 3.2*  --  1.5  --     Liver Function Tests: Recent Labs  Lab 08/23/2020 1958  AST 14*  ALT 9  ALKPHOS 56  BILITOT 0.2*  PROT 4.5*  ALBUMIN 1.9*   No results for input(s): LIPASE, AMYLASE in the last 168 hours. No results for input(s): AMMONIA in the last 168 hours.  ABG    Component Value Date/Time   PHART 7.491 (H) 06/07/2020 0355   PCO2ART 27.9 (L) 06/07/2020 0355   PO2ART 308 (H) 06/07/2020 0355   HCO3 21.7 06/28/2020 1325   TCO2 25 11/29/2007 1303   ACIDBASEDEF 2.7 (H) 06/28/2020 1325  O2SAT 50.0 06/28/2020 1325     Coagulation Profile: No results for input(s): INR, PROTIME in the last 168 hours.  Cardiac Enzymes: No results for input(s): CKTOTAL, CKMB, CKMBINDEX, TROPONINI in the last 168 hours.  HbA1C: Hgb A1c MFr Bld  Date/Time Value Ref Range Status  08/28/2020 03:08 AM 5.8 (H) 4.8 - 5.6 % Final    Comment:    (NOTE) Pre diabetes:          5.7%-6.4%  Diabetes:              >6.4%  Glycemic control for   <7.0% adults with diabetes   06/06/2020 09:47 AM 7.7 (H) 4.8 - 5.6 % Final    Comment:    (NOTE)         Prediabetes: 5.7 - 6.4         Diabetes: >6.4         Glycemic control for adults with diabetes: <7.0     CBG: Recent Labs  Lab 08/28/20 1932 08/28/20 2056 08/28/20 2318 08/29/20 0022 08/29/20 0347  GLUCAP 331* 274* 347* 336* 270*    The patient is critically ill with multiple organ systems failure and requires high complexity decision making for assessment and support, frequent evaluation and titration of therapies, application of advanced  monitoring technologies and extensive interpretation of multiple databases. Critical Care Time devoted to patient care services described in this note is 35 minutes.   Freda Jackson, MD [facility] Office: [phone]  See Amion for Pager Details

## 2020-08-29 NOTE — Progress Notes (Signed)
eLink Physician-Brief Progress Note Patient Name: SHAHID FLORI DOB: 04/06/1941 MRN: [id number]   Date of Service  08/29/2020  HPI/Events of Note  pt had  episode of hypoglycemia 54 blood sugar was give orange juice up to 70>>pt is on sensitive sliding scale per RN pt not eating much do you want to start some dextrose fluid just for tonight to prevent another episode of hypoglycemia  eICU Interventions  -D5 at 30 ml/hr ordered. Follow BG levels.  Asp precautions     Intervention Category Intermediate Interventions: Other:  Elmer Sow 08/29/2020, 9:49 PM

## 2020-08-29 NOTE — Progress Notes (Signed)
[facility] Progress Note Patient Name: Tony Marshall DOB: 06-Sep-1941 MRN: [id number]   Date of Service  08/29/2020  HPI/Events of Note  Oliguria - bladder scan with > 400 mL residual.   eICU Interventions  Plan: 1. I/O Cath PRN.      Intervention Category Major Interventions: Other:  Lysle Dingwall 08/29/2020, 6:15 AM

## 2020-08-30 ENCOUNTER — Inpatient Hospital Stay (HOSPITAL_COMMUNITY): Payer: Medicare HMO

## 2020-08-30 DIAGNOSIS — J95811 Postprocedural pneumothorax: Secondary | ICD-10-CM

## 2020-08-30 DIAGNOSIS — L8990 Pressure ulcer of unspecified site, unspecified stage: Secondary | ICD-10-CM | POA: Insufficient documentation

## 2020-08-30 DIAGNOSIS — J9600 Acute respiratory failure, unspecified whether with hypoxia or hypercapnia: Secondary | ICD-10-CM

## 2020-08-30 LAB — CBC WITH DIFFERENTIAL/PLATELET
Abs Immature Granulocytes: 0.23 10*3/uL — ABNORMAL HIGH (ref 0.00–0.07)
Basophils Absolute: 0 10*3/uL (ref 0.0–0.1)
Basophils Relative: 1 %
Eosinophils Absolute: 0 10*3/uL (ref 0.0–0.5)
Eosinophils Relative: 1 %
HCT: 33.3 % — ABNORMAL LOW (ref 39.0–52.0)
Hemoglobin: 10.9 g/dL — ABNORMAL LOW (ref 13.0–17.0)
Immature Granulocytes: 7 %
Lymphocytes Relative: 20 %
Lymphs Abs: 0.7 10*3/uL (ref 0.7–4.0)
MCH: 28.6 pg (ref 26.0–34.0)
MCHC: 32.7 g/dL (ref 30.0–36.0)
MCV: 87.4 fL (ref 80.0–100.0)
Monocytes Absolute: 0.2 10*3/uL (ref 0.1–1.0)
Monocytes Relative: 6 %
Neutro Abs: 2.3 10*3/uL (ref 1.7–7.7)
Neutrophils Relative %: 65 %
Platelets: 58 10*3/uL — ABNORMAL LOW (ref 150–400)
RBC: 3.81 MIL/uL — ABNORMAL LOW (ref 4.22–5.81)
RDW: 23.3 % — ABNORMAL HIGH (ref 11.5–15.5)
WBC: 3.5 10*3/uL — ABNORMAL LOW (ref 4.0–10.5)
nRBC: 0 % (ref 0.0–0.2)

## 2020-08-30 LAB — GLUCOSE, CAPILLARY
Glucose-Capillary: 100 mg/dL — ABNORMAL HIGH (ref 70–99)
Glucose-Capillary: 110 mg/dL — ABNORMAL HIGH (ref 70–99)
Glucose-Capillary: 117 mg/dL — ABNORMAL HIGH (ref 70–99)
Glucose-Capillary: 131 mg/dL — ABNORMAL HIGH (ref 70–99)
Glucose-Capillary: 134 mg/dL — ABNORMAL HIGH (ref 70–99)
Glucose-Capillary: 147 mg/dL — ABNORMAL HIGH (ref 70–99)
Glucose-Capillary: 79 mg/dL (ref 70–99)

## 2020-08-30 LAB — BASIC METABOLIC PANEL
Anion gap: 10 (ref 5–15)
BUN: 40 mg/dL — ABNORMAL HIGH (ref 8–23)
CO2: 17 mmol/L — ABNORMAL LOW (ref 22–32)
Calcium: 8.4 mg/dL — ABNORMAL LOW (ref 8.9–10.3)
Chloride: 108 mmol/L (ref 98–111)
Creatinine, Ser: 0.99 mg/dL (ref 0.61–1.24)
GFR calc Af Amer: >60 mL/min (ref >60)
GFR calc non Af Amer: >60 mL/min (ref >60)
Glucose, Bld: 130 mg/dL — ABNORMAL HIGH (ref 70–99)
Potassium: 2.9 mmol/L — ABNORMAL LOW (ref 3.5–5.1)
Sodium: 135 mmol/L (ref 135–145)

## 2020-08-30 MED ORDER — ALPRAZOLAM 0.25 MG PO TABS: 0.2500 mg | ORAL_TABLET | Freq: Once | ORAL | Status: DC

## 2020-08-30 MED ORDER — LORAZEPAM 2 MG/ML IJ SOLN
1.0000 mg | Freq: Once | INTRAMUSCULAR | Status: AC
  Administered 2020-08-31: 1 mg via INTRAVENOUS
  Filled 2020-08-30: qty 1

## 2020-08-30 MED ORDER — DEXTROSE IN LACTATED RINGERS 5 % IV SOLN: INTRAVENOUS | Status: AC

## 2020-08-30 MED ORDER — ALPRAZOLAM 0.25 MG PO TABS
0.2500 mg | ORAL_TABLET | Freq: Once | ORAL | Status: AC
  Administered 2020-08-30: 0.25 mg via ORAL
  Filled 2020-08-30: qty 1

## 2020-08-30 MED ORDER — QUETIAPINE FUMARATE 50 MG PO TABS
25.0000 mg | ORAL_TABLET | Freq: Every day | ORAL | Status: DC
  Administered 2020-08-31: 25 mg via NASOGASTRIC
  Filled 2020-08-30 (×2): qty 1

## 2020-08-30 NOTE — TOC Initial Note (Signed)
Transition of Care [facility]) - Initial/Assessment Note    Patient Details  Name: Tony Marshall MRN: [id number] Date of Birth: 1941-07-07  Transition of [facility]) CM/SW Contact:    Leeroy Cha, RN Phone Number: 08/30/2020, 8:50 AM  Clinical Narrative:                 A.Fib, Rt pmenuothoarx, pna, hx pof HFrEF and CL, bun 50 and creat 1.34 gfr both are decrease-uro9logy seeing, iv maxipime and iv levophed. Following for progression and toc needs. Plan home with possible hhc.  Expected Discharge Plan: [place] Barriers to Discharge: Continued Medical Work up   Patient Goals and CMS Choice Patient states their goals for this hospitalization and ongoing recovery are:: to go home CMS Medicare.gov Compare Post Acute Care list provided to:: Patient    Expected Discharge Plan and Services Expected Discharge Plan: [place]   Discharge Planning Services: CM Consult   Living arrangements for the past 2 months: Single Family Home                                      Prior Living Arrangements/Services Living arrangements for the past 2 months: Single Family Home Lives with:: Spouse Patient language and need for interpreter reviewed:: Yes Do you feel safe going back to the place where you live?: Yes      Need for Family Participation in Patient Care: Yes (Comment) Care giver support system in place?: Yes (comment)   Criminal Activity/Legal Involvement Pertinent to Current Situation/Hospitalization: No - Comment as needed  Activities of Daily Living Home Assistive Devices/Equipment: Wheelchair, Environmental consultant (specify type), Shower chair with back, Bedside commode/3-in-1, CBG Meter, Eyeglasses (4 Wheel walker, Rollator) ADL Screening (condition at time of admission) Patient's cognitive ability adequate to safely complete daily activities?: Yes Is the patient deaf or have difficulty hearing?: No Does the patient have difficulty seeing, even  when wearing glasses/contacts?: No Does the patient have difficulty concentrating, remembering, or making decisions?: No Patient able to express need for assistance with ADLs?: Yes Does the patient have difficulty dressing or bathing?: Yes Independently performs ADLs?: No Communication: Independent Dressing (OT): Needs assistance Is this a change from baseline?: Pre-admission baseline Grooming: Independent Feeding: Independent Bathing: Needs assistance Is this a change from baseline?: Pre-admission baseline Toileting: Needs assistance Is this a change from baseline?: Pre-admission baseline In/Out Bed: Needs assistance Is this a change from baseline?: Pre-admission baseline Walks in Home: Dependent Is this a change from baseline?: Pre-admission baseline Does the patient have difficulty walking or climbing stairs?: Yes Weakness of Legs: Both Weakness of Arms/Hands: None  Permission Sought/Granted                  Emotional Assessment Appearance:: Appears stated age Attitude/Demeanor/Rapport: Engaged Affect (typically observed): Calm Orientation: : Fluctuating Orientation (Suspected and/or reported Sundowners) Alcohol / Substance Use: Not Applicable Psych Involvement: No (comment)  Admission diagnosis:  Acute respiratory failure ([place]) [J96.00] HCAP (healthcare-associated pneumonia) [J18.9] Central line complication, initial encounter [T82.9XXA] Septic shock ([place]) [A41.9, R65.21] Symptomatic anemia [D64.9] Hypotension due to hypovolemia [I95.89, E86.1] Patient Active Problem List   Diagnosis Date Noted  . Septic shock ([place]) 08/27/2020  . Iatrogenic pneumothorax on R  08/27/2020  . AKI (acute kidney injury) ([place]) 08/27/2020  . Hypotension due to hypovolemia   . Advanced care planning/counseling discussion   . Palliative care by  specialist   . Cough   . DNR (do not resuscitate)   . Physical deconditioning 07/19/2020  . Stercoral ulcer of rectum   . Prolonged QT  interval 07/03/2020  . Atrial fibrillation ([place])   . Shock circulatory ([place]) 07/01/2020  . Lower GI bleed   . Central venous catheter in place   . Fecal impaction ([place])   . Overflow diarrhea   . Rectal bleeding   . DKA (diabetic ketoacidoses) ([place]) 06/28/2020  . Systolic and diastolic CHF, chronic ([place]) 06/28/2020  . Anemia associated with acute blood loss   . Dehydration 06/27/2020  . Protein-calorie malnutrition, severe ([place]) 06/09/2020  . Hyperglycemia due to diabetes mellitus ([place])   . Leukopenia   . Acute combined systolic and diastolic heart failure ([place])   . Cardiogenic shock ([place])   . Altered mental status   . Failure to thrive in adult   . Symptomatic anemia   . Acute respiratory failure ([place])   . Increased anion gap metabolic acidosis 67/67/2094  . Goals of care, counseling/discussion 03/28/2020  . Encounter for antineoplastic chemotherapy 03/28/2020  . GERD (gastroesophageal reflux disease) 05/13/2014  . History of colonic polyps 05/13/2014  . Hiatal hernia 05/13/2014  . CLL (chronic lymphocytic leukemia) ([place]) 10/25/2013  . ED (erectile dysfunction) 10/20/2013  . Lymphocytosis 09/23/2013  . Essential hypertension 07/21/2013  . Pure hypercholesterolemia 07/21/2013  . Diabetes mellitus ([place]) 07/20/2013   PCP:  Timoteo Gaul, FNP Pharmacy:   [id number] - [place], [address] [place] [address] Phone: [phone] Fax: [place], [place] [address] [place] [address] Phone: [phone] Fax: [phone]     Social Determinants of Health (SDOH) Interventions    Readmission Risk Interventions Readmission Risk Prevention Plan 07/21/2020  Transportation Screening Complete  Medication Review ([place]) Complete  PCP or Specialist appointment within 3-5 days of discharge Complete  HRI or [place] Complete  SW Recovery Care/Counseling Consult Complete  Palliative Care Screening Complete  [place] Patient Refused  Some recent data might be hidden

## 2020-08-30 NOTE — Progress Notes (Signed)
eLink Physician-Brief Progress Note Patient Name: Tony Marshall DOB: January 24, 1941 MRN: [id number]   Date of Service  08/30/2020  HPI/Events of Note  NGT completely pulled out despite restraints Unable to give due dose Seroquel Patient takes xanax which was given earlier today Patient seen restless  eICU Interventions  Ordered one time dose of ativan 1 mg IV     Intervention Category Minor Interventions: Agitation / anxiety - evaluation and management  Judd Lien 08/30/2020, 11:49 PM

## 2020-08-30 NOTE — Progress Notes (Signed)
Nutrition Follow-up  DOCUMENTATION CODES:   Severe malnutrition in context of chronic illness, Underweight  INTERVENTION:  - continue [employer] 1.4 po BID, Magic Cup once/day, and 30 ml Prosource Plus BID. - if within [place] to plan small bore NGT, TF recommendation: Osmolite 1.2 @ 20 ml/hr to advance by 10 ml every 12 hours to reach goal rate of 60 ml/hr with 45 ml Prosource TF once/day. - at goal rate, this regimen will provide 1768 kcal, 91 grams protein, and 1181 ml free water.   * if TF initiated monitor magnesium, potassium, and phosphorus daily for at least 3 days, MD to replete as needed, as pt is at risk for refeeding syndrome given severe malnutrition, poor PO intakes for a prolonged period of time.   NUTRITION DIAGNOSIS:   Severe Malnutrition related to chronic illness, cancer and cancer related treatments as evidenced by moderate fat depletion, moderate muscle depletion, severe fat depletion, severe muscle depletion. -ongoing  GOAL:   Patient will meet greater than or equal to 90% of their needs -unmet  MONITOR:   PO intake, Supplement acceptance, Labs, Weight trends, Other (Comment) (discussion concerning NGT placement and TF initiation)  ASSESSMENT:   79 year old man with medical history of afib not on anticoagulation, HFrEF, CLL s/p 3 cycles chemo, DM, HTN, recent PNA on 8/27 and had 2 courses of abx. He presented to the ED due to poor PO intake, shortness of breath, and lethargy. He has had a productive cough.  Patient was discussed in rounds this AM. RN reports that patient is confused and that this has led to him not eating or drinking at all, not taking oral nutrition supplements. Plan is for discussion about wife about small bore NGT placement and TF initiation; patient will likely require wrist restraints as he did pull out a line yesterday/overnight.   Weight today is +2.2 kg/5 lb compared to admission (9/12) weight. Generalized mild pitting edema and moderate  pitting edema to BLE documented in the flow sheet.     Labs reviewed; CBGs: 100, 117, 110 mg/dl, Na: 133 mmol/l, BUN: 50 mg/dl, creatinine: 1.34 mg/dl, Ca: 8.3 mg/dl, Phos: 2.4 mg/dl, Mg: 2.5 mg/dl, GFR: 51 ml/min.  Medications reviewed; 12.5 g albumin x3 doses 9/14, sliding scale novolog, 10 units lantus/day, 1 tablet multivitamin with minerals/day, 40 mg IV protonix/day.   Diet Order:   Diet Order            Diet regular Room service appropriate? Yes; Fluid consistency: Thin  Diet effective now                 EDUCATION NEEDS:   No education needs have been identified at this time  Skin:  Skin Assessment: Skin Integrity Issues: Skin Integrity Issues:: DTI DTI: sacrum and L side of nose  Last BM:  9/15 (type 6 x1; type 7 x1)  Height:   Ht Readings from Last 1 Encounters:  08/27/20 5' 7.5" (1.715 m)    Weight:   Wt Readings from Last 1 Encounters:  08/30/20 54.5 kg     Estimated Nutritional Needs:  Kcal:  1700-1900 kcal Protein:  90-100 grams Fluid:  >/= 1.8 L/day     Jarome Matin, MS, RD, LDN, CNSC Inpatient Clinical Dietitian RD pager # available in AMION  After hours/weekend pager # available in [facility]

## 2020-08-30 NOTE — Progress Notes (Signed)
At start of shift, before xray results back about initial placement of NG/dobpoff tube, approximately between 2000-2100, patient wearing mittens pulled on dobbpoff tube and right hand IV that was under mitten. Orders obtained also for bilateral wrist restraints and those were placed.Stylet for tube was still in place and RN advanced to 53 marking. KUB reordered. Planned to readvance tube per xray results.  Reentered room at approximately 2230 to find patient had still somehow pulled out tube from nare. Tube intact including weighted stylet. Informed E link RN & MD. Requested something to administer IV as unable to give Seroquel via tube. Further unable to replace tube this shift.

## 2020-08-30 NOTE — Progress Notes (Signed)
NAME:  Tony Marshall, MRN:  [id number], DOB:  05-Mar-1941, LOS: 3 ADMISSION DATE:  09/06/2020, CONSULTATION DATE:  08/30/20 REFERRING MD:  EDP, CHIEF COMPLAINT:  lethargy  Brief History   38 yowm never smoker with history of HFrEF and CLL s/p three cycles chemo and rx avelox since 8/27    for PNA who presented with lethargy and poor po intake.  Found to have worsening bibasilar airspace disease and pleural effusions with hypotension despite 3.5L IVF. PCCM consulted for admission.  History of present illness   79 year old man with history of Atrial fibrillation not on anticoagulation, HFrEF, CLL s/p 3 cycles chemo, DM, HTN with recent PNA on 8/27 and has had 2 courses of antibiotics who was brought in for poor po intake and lethargy.  He is accompanied by his wife.  He has been feeling poorly for the last several weeks.  He feels some shortness of breath but denies chest pain.  Denies fevers or chills.  He has been coughing with mucus production that is green.  He does report sometimes choking on/coughing with food intake.  CXR significant with bilateral pleural effusions and worsenining bibasilar airspace disease.  He was placed on Bipap and given 3.5L IVF with Vanc and zosyn.  Labs were significant for WBC 3.3, Hgb 6.1 down from baseline of 9 several weeks ago, Lactic acid 3.2, Covid-19 negative.  Despite IVF he remained hypotensive, so vasopressors were initiated and PCCM consulted for admission  Past Medical History  Atrial fibrillation not on anticoagulation  HFrEF CLL s/p 3 cycles chemo DM HTN GERD  OSA  Significant Hospital Events   9/11 Admit to PCCM 9/15 weaned off vasopressors  Consults:  None  Procedures:  9/12 RIJ attempted, R pigtail chest tube by ED 9/12 L Fem CVL  Significant Diagnostic Tests:     Micro Data:  Sars-Cov-2 PCR 9/11 >negative Sputum culture  9/12 > Blood Cx  9/12> Urine Cx  9/12>  Antimicrobials:  Zozyn 9/11 only Vancomycin 9/11> Cefepime  9/12>  Interim history/subjective:  Patient with increasing delirium over the past 24 hours. Again, not much sleep last night per nursing. He pulled out his central line overnight. He has been weaned off levophed. He became hypoglycemic overnight. D5 started.  Objective   Blood pressure 126/80, pulse 83, temperature (!) 96.9 F (36.1 C), temperature source Axillary, resp. rate 18, height 5' 7.5" (1.715 m), weight 54.5 kg, SpO2 99 %.        Intake/Output Summary (Last 24 hours) at 08/30/2020 1157 Last data filed at 08/30/2020 1000 Gross per 24 hour  Intake 726.96 ml  Output 1961 ml  Net -1234.04 ml   Exam: Gen: chronically ill appearing, thin, no acute distress HEENT: EOMI, sclera anicteric Lungs: rhonchi on right; normal respiratory effort CV: Regular rate and rhythm; no murmurs Abd: minimal bowel sounds; soft, non-distended, non-tender JKD:TOIZT edema bilaterally Skin: Warm and dry; no rash Neuro:   alert, awake, moves all extremities  I personally reviewed images and agree with radiology impression as follows:  CXR:   9/14  - increased right pneuomthorax   Assessment & Plan:  79 year old man with history of HFrEF and CLL s/p three cycles chemo with chronic dysphagia and on Avelox since 8/27  for PNA who presented with lethargy and poor po intake.  Found to have worsening bibasilar airspace disease and pleural effusions with hypotension despite 3.5L IVF.   Shock, resolving Likely septic in nature with concern for pneumonia and hypovolemic due  to poor oral intake - Cultures unrevealing - Completing a course of cefepime - weaned off pressors 9/15  Pneumonia, possible aspiration pneumonia - Covering with cefepime  - MRSA screen negative. - Hx of klebsiella in sputum - No concern for aspiration on speech evaluation  R  Pneumothorax: s/p chest tube placement on 9/12 - Increased with decrease in chest tube suction to -20. - Increase  suction back to -40 - will repeat chest radiograph tomorrow morning  AKI, metabolic acidosis without anion gap, very mild lactic acidosis: Baseline around 0.7, admission Cr 1.54 - Continue to monitor - Start D5LR at 65mL/hr  Hypoglycemic event - in setting of restarting long acting insulin yesterday and poor oral intake - continue D5LR at 8mL/hr  Protein Malnutrition/Anorexia - Appreciate nutrition recommendations - Supplements ordered per their recommendations - Dronabinol discontinued due to delirium yesterday - Will place doboff tube today as ok with family  Depressed Affect -Patient may benefit from antidepressant therapy such as mirtazipine in the future after his acute illness has resolved  Diabetes Mellitus - SSI - Monitor closely today after tube feeds are initiated  Delirium In setting of critical illness and altered sleep wake cycle - Hold benzodiazepines for agitation - Will consider starting precedex if needed - Will start seroquel via NG tube this evening  Best practice:  Diet: NPO Pain/Anxiety/Delirium protocol (if indicated): Monitor VAP protocol (if indicated): N/A DVT prophylaxis: SCDs GI prophylaxis: N/A Glucose control: Monitor Mobility: PT when able Code Status: DNR but intubation prn Family Communication: Discussed care plan with patient's daughter Disposition: ICU  Labs   CBC: Recent Labs  Lab 09/11/2020 1609 09/07/2020 1609 08/27/20 0623 08/27/20 1144 08/27/20 2139 08/28/20 0311 08/29/20 0854  WBC 3.3*  --  4.4 6.1  --  8.2 8.6  NEUTROABS 1.1*  --   --   --   --   --   --   HGB 6.1*   < > 8.3* 6.7* 11.0* 11.1* 11.1*  HCT 20.0*   < > 26.6* 21.1* 33.9* 33.9* 33.4*  MCV 104.2*  --  101.1* 97.7  --  89.2 86.3  PLT 126*  --  151 131*  --  140* 117*   < > = values in this interval not displayed.    Basic Metabolic Panel: Recent Labs  Lab 08/21/2020 1958 08/27/20 0623 08/28/20 0311 08/29/20 0854 08/29/20 1641  NA 134* 129* 130* 133*  --    K 5.1 4.9 4.7 3.9  --   CL 108 102 104 106  --   CO2 19* 16* 14* 16*  --   GLUCOSE 85 79 223* 184*  --   BUN 29* 33* 40* 50*  --   CREATININE 1.54* 1.32* 1.16 1.34*  --   CALCIUM 8.8* 8.3* 8.2* 8.3*  --   MG 1.6* 1.5* 2.6* 2.5*  --   PHOS  --  2.7  --   --  2.4*   GFR: Estimated Creatinine Clearance: 36.2 mL/min (A) (by C-G formula based on SCr of 1.34 mg/dL (H)). Recent Labs  Lab 09/11/2020 1609 08/18/2020 1633 09/11/2020 1958 08/27/20 0623 08/27/20 1144 08/28/20 0311 08/29/20 0854  WBC   < >  --   --  4.4 6.1 8.2 8.6  LATICACIDVEN  --  2.1* 3.2*  --  1.5  --   --    < > = values in this interval not displayed.    Liver Function Tests: Recent Labs  Lab 09/11/2020 1958  AST 14*  ALT 9  ALKPHOS 56  BILITOT 0.2*  PROT 4.5*  ALBUMIN 1.9*   No results for input(s): LIPASE, AMYLASE in the last 168 hours. No results for input(s): AMMONIA in the last 168 hours.  ABG    Component Value Date/Time   PHART 7.491 (H) 06/07/2020 0355   PCO2ART 27.9 (L) 06/07/2020 0355   PO2ART 308 (H) 06/07/2020 0355   HCO3 21.7 06/28/2020 1325   TCO2 25 11/29/2007 1303   ACIDBASEDEF 2.7 (H) 06/28/2020 1325   O2SAT 50.0 06/28/2020 1325     Coagulation Profile: No results for input(s): INR, PROTIME in the last 168 hours.  Cardiac Enzymes: No results for input(s): CKTOTAL, CKMB, CKMBINDEX, TROPONINI in the last 168 hours.  HbA1C: Hgb A1c MFr Bld  Date/Time Value Ref Range Status  08/28/2020 03:08 AM 5.8 (H) 4.8 - 5.6 % Final    Comment:    (NOTE) Pre diabetes:          5.7%-6.4%  Diabetes:              >6.4%  Glycemic control for   <7.0% adults with diabetes   06/06/2020 09:47 AM 7.7 (H) 4.8 - 5.6 % Final    Comment:    (NOTE)         Prediabetes: 5.7 - 6.4         Diabetes: >6.4         Glycemic control for adults with diabetes: <7.0     CBG: Recent Labs  Lab 08/29/20 2025 08/29/20 2046 08/30/20 0001 08/30/20 0420 08/30/20 0807  GLUCAP 64* 70 100* 117* 110*      Freda Jackson, MD [facility] Office: [phone]  See Amion for Pager Details

## 2020-08-30 NOTE — Progress Notes (Signed)
Pharmacy Antibiotic Note  Tony Marshall is a 79 y.o. male admitted on 08/22/2020 with sepsis.  Pharmacy has been consulted for cefepime dosing. Day #4 Cefepime AFebrile, hypothermic to 96 WBC wnl SCr up to 1.34 with CrCl ~ 36 ml/min  Plan: Cefepime 2 gr IV q12h   Monitor clinical course, renal function, cultures as available   Height: 5' 7.5" (171.5 cm) Weight: 54.5 kg (120 lb 2.4 oz) IBW/kg (Calculated) : 67.25  Temp (24hrs), Avg:97 F (36.1 C), Min:96 F (35.6 C), Max:98 F (36.7 C)  Recent Labs  Lab 08/17/2020 1609 09/07/2020 1633 09/03/2020 1958 08/27/20 0623 08/27/20 1144 08/28/20 0311 08/29/20 0854  WBC 3.3*  --   --  4.4 6.1 8.2 8.6  CREATININE  --   --  1.54* 1.32*  --  1.16 1.34*  LATICACIDVEN  --  2.1* 3.2*  --  1.5  --   --     Estimated Creatinine Clearance: 36.2 mL/min (A) (by C-G formula based on SCr of 1.34 mg/dL (H)).    Allergies  Allergen Reactions  . Glycopyrrolate Rash    Antimicrobials this admission: 9/11 Zosyn x1 9/11 vancomycin >> 9/13 9/12 cefepime >>  Dose adjustments this admission:  Microbiology results: 9/11 COVID-19: Negative  9/12 BCx1: ngtd 9/12 UCx: <10k colonies 9/12 sputum: (saliva) 9/23 MRSA PCR: negative   Thank you for allowing pharmacy to be a part of this patient's care.  Gretta Arab PharmD, BCPS Clinical Pharmacist [facility] main pharmacy [phone] 08/30/2020 12:10 PM

## 2020-08-30 NOTE — Progress Notes (Signed)
[facility] Progress Note Patient Name: Tony Marshall DOB: July 19, 1941 MRN: [id number]   Date of Service  08/30/2020  HPI/Events of Note  per RN pt getting agitated  and slightly combative trying to get out of bed>>had  abedtime dose of xanax 0.25mg   at 2152, can we get some order to calm patient? pt can take po meds  Camera; VS stable.   eICU Interventions  Low dose xanax once oral.  Watch for confusion, lethargy. asp precautions. Discussed with covering RN.      Intervention Category Intermediate Interventions: Other:  Elmer Sow 08/30/2020, 1:40 AM

## 2020-08-30 NOTE — Progress Notes (Signed)
eLink Physician-Brief Progress Note Patient Name: Tony Marshall DOB: [birth year] MRN: [id number]   Date of Service  08/30/2020  HPI/Events of Note  Request for order of increasing CT suction to -40, restraints as patient pulled on NG tube  eICU Interventions  On review of chart, plan is to increase chest tube suction to -40, will order Bilateral wrist restraints ordered Elink to be informed once repeat KUB done     Intervention Category Intermediate Interventions: Other:  Judd Lien 08/30/2020, 9:26 PM

## 2020-08-31 ENCOUNTER — Inpatient Hospital Stay (HOSPITAL_COMMUNITY): Payer: Medicare HMO

## 2020-08-31 DIAGNOSIS — G9340 Encephalopathy, unspecified: Secondary | ICD-10-CM

## 2020-08-31 LAB — GLUCOSE, CAPILLARY
Glucose-Capillary: 84 mg/dL (ref 70–99)
Glucose-Capillary: 131 mg/dL — ABNORMAL HIGH (ref 70–99)
Glucose-Capillary: 216 mg/dL — ABNORMAL HIGH (ref 70–99)
Glucose-Capillary: 160 mg/dL — ABNORMAL HIGH (ref 70–99)
Glucose-Capillary: 114 mg/dL — ABNORMAL HIGH (ref 70–99)
Glucose-Capillary: 153 mg/dL — ABNORMAL HIGH (ref 70–99)

## 2020-08-31 LAB — CBC
HCT: 39.5 % (ref 39.0–52.0)
Hemoglobin: 12.8 g/dL — ABNORMAL LOW (ref 13.0–17.0)
MCH: 28.6 pg (ref 26.0–34.0)
MCHC: 32.4 g/dL (ref 30.0–36.0)
MCV: 88.4 fL (ref 80.0–100.0)
Platelets: 66 10*3/uL — ABNORMAL LOW (ref 150–400)
RBC: 4.47 MIL/uL (ref 4.22–5.81)
RDW: 23.8 % — ABNORMAL HIGH (ref 11.5–15.5)
WBC: 4.9 10*3/uL (ref 4.0–10.5)
nRBC: 0 % (ref 0.0–0.2)

## 2020-08-31 LAB — BASIC METABOLIC PANEL
Anion gap: 9 (ref 5–15)
BUN: 35 mg/dL — ABNORMAL HIGH (ref 8–23)
CO2: 17 mmol/L — ABNORMAL LOW (ref 22–32)
Calcium: 8.6 mg/dL — ABNORMAL LOW (ref 8.9–10.3)
Chloride: 109 mmol/L (ref 98–111)
Creatinine, Ser: 0.97 mg/dL (ref 0.61–1.24)
GFR calc Af Amer: >60 mL/min (ref >60)
GFR calc non Af Amer: >60 mL/min (ref >60)
Glucose, Bld: 91 mg/dL (ref 70–99)
Potassium: 2.6 mmol/L — CL (ref 3.5–5.1)
Sodium: 135 mmol/L (ref 135–145)

## 2020-08-31 LAB — MAGNESIUM: Magnesium: 1.9 mg/dL (ref 1.7–2.4)

## 2020-08-31 MED ORDER — ACETAMINOPHEN 160 MG/5ML PO SOLN
650.0000 mg | Freq: Four times a day (QID) | ORAL | Status: DC | PRN
  Administered 2020-08-31 – 2020-09-07 (×5): 650 mg
  Filled 2020-08-31 (×6): qty 20.3

## 2020-08-31 MED ORDER — FENTANYL CITRATE (PF) 100 MCG/2ML IJ SOLN
INTRAMUSCULAR | Status: AC
  Administered 2020-08-31: 50 ug
  Filled 2020-08-31: qty 2

## 2020-08-31 MED ORDER — OSMOLITE 1.2 CAL PO LIQD
1000.0000 mL | ORAL | Status: DC
  Administered 2020-08-31: 1000 mL

## 2020-08-31 MED ORDER — SODIUM CHLORIDE 0.9 % IV SOLN
INTRAVENOUS | Status: DC | PRN
  Administered 2020-08-31: 500 mL via INTRAVENOUS

## 2020-08-31 MED ORDER — POTASSIUM CHLORIDE 10 MEQ/100ML IV SOLN
10.0000 meq | INTRAVENOUS | Status: AC
  Administered 2020-08-31 (×8): 10 meq via INTRAVENOUS
  Filled 2020-08-31 (×8): qty 100

## 2020-08-31 MED ORDER — HYDRALAZINE HCL 20 MG/ML IJ SOLN
10.0000 mg | INTRAMUSCULAR | Status: DC | PRN
  Administered 2020-08-31: 10 mg via INTRAVENOUS
  Filled 2020-08-31: qty 1

## 2020-08-31 MED ORDER — PHENYLEPHRINE 40 MCG/ML (10ML) SYRINGE FOR IV PUSH (FOR BLOOD PRESSURE SUPPORT)
400.0000 ug | PREFILLED_SYRINGE | Freq: Once | INTRAVENOUS | Status: AC | PRN
  Administered 2020-08-31: 400 ug via INTRAVENOUS

## 2020-08-31 MED ORDER — POTASSIUM CHLORIDE CRYS ER 20 MEQ PO TBCR: 40.0000 meq | EXTENDED_RELEASE_TABLET | ORAL | Status: AC

## 2020-08-31 MED ORDER — LACTATED RINGERS IV BOLUS
1000.0000 mL | Freq: Once | INTRAVENOUS | Status: AC
  Administered 2020-08-31: 1000 mL via INTRAVENOUS

## 2020-08-31 NOTE — Progress Notes (Signed)
Narrative & Impression  CLINICAL DATA:  Follow-up pneumothorax.  EXAM: PORTABLE CHEST 1 VIEW  COMPARISON:  08/29/2020  FINDINGS: The right-sided chest tube is stable in position. Enlarging right pneumothorax now estimated at 50%. Mild shift of the heart to the left side and lower left lung volumes with progressive left lung atelectasis.  IMPRESSION: Enlarging right pneumothorax now estimated at 50%. Some component of a tension pneumothorax is noted with slight shift of the heart to the left and lower left lung volumes with compressive atelectasis.  These results will be called to the ordering clinician or representative by the Radiologist Assistant, and communication documented in the PACS or [employer].   Electronically Signed   By: Marijo Sanes M.D.   On: 08/31/2020 06:41    Called above to [facility].

## 2020-08-31 NOTE — Progress Notes (Signed)
NAME:  Tony Marshall, MRN:  [id number], DOB:  [birth year], LOS: 4 ADMISSION DATE:  08/28/2020, CONSULTATION DATE:  08/31/20 REFERRING MD:  EDP, CHIEF COMPLAINT:  lethargy  Brief History   55 yowm never smoker with history of HFrEF and CLL s/p three cycles chemo and rx avelox since 8/27    for PNA who presented with lethargy and poor po intake.  Found to have worsening bibasilar airspace disease and pleural effusions with hypotension despite 3.5L IVF. PCCM consulted for admission.  Labs were significant for WBC 3.3, Hgb 6.1 down from baseline of 9 several weeks ago, Lactic acid 3.2, Covid-19 negative.   Course complicated by right pneumothorax after right IJ attempt  Past Medical History  Atrial fibrillation not on anticoagulation  HFrEF CLL s/p 3 cycles chemo DM HTN GERD  OSA  Significant Hospital Events   9/11 Admit to PCCM 9/15 weaned off vasopressors ,increasing delirium, pulled out his central line   Consults:  None  Procedures:  9/12 RIJ attempted, R pigtail chest tube by ED 9/12 L Fem CVL  Significant Diagnostic Tests:     Micro Data:  Sars-Cov-2 PCR 9/11 >negative Sputum culture  9/12 > Blood Cx  9/12>ng Urine Cx  9/12> insig growth  Antimicrobials:  Zozyn 9/11 only Vancomycin 9/11> Cefepime 9/12>  Interim history/subjective:   Called to bedside for increasing respiratory distress. He has transition from room air to 3 to 4 L nasal cannula. Delirium persists Afebrile No air leak on chest tube   Objective   Blood pressure (!) 162/84, pulse 85, temperature 97.7 F (36.5 C), temperature source Axillary, resp. rate 17, height 5' 7.5" (1.715 m), weight 51.3 kg, SpO2 (!) 81 %.        Intake/Output Summary (Last 24 hours) at 08/31/2020 0825 Last data filed at 08/31/2020 4166 Gross per 24 hour  Intake 1456.67 ml  Output 2670 ml  Net -1213.33 ml   Exam: Gen: chronically ill appearing, thin, no acute distress HEENT: EOMI, sclera  anicteric Lungs: Decreased breath sounds on right, good air entry on left, mild accessory muscle use CV: Regular rate and rhythm; no murmurs Abd: minimal bowel sounds; soft, non-distended, non-tender AYT:KZSWF edema bilaterally Skin: Warm and dry; no rash Neuro:    Lethargic, was able to tell me his name, confused  Labs show hypokalemia, no leukocytosis, thrombocytopenia  I personally reviewed images and agree with radiology impression as follows:  CXR:   9/16, tension right pneumothorax with shift of mediastinum  Resolved problems  AKI, metabolic acidosis without anion gap, very mild lactic acidosis: Baseline around 0.7, admission Cr 1.54  Assessment & Plan:  79 year old man with history of HFrEF and CLL s/p three cycles chemo with chronic dysphagia and on Avelox since 8/27  for PNA who presented with lethargy and poor po intake.  Found to have worsening bibasilar airspace disease and pleural effusions with hypotension despite 3.5L IVF.   Septic shock-resolved - concern for pneumonia and hypovolemic due to poor oral intake - Cultures negative -Plan for cefepime x 7 days total - weaned off pressors 9/15  Pneumonia, possible aspiration pneumonia - Covering with cefepime  - MRSA screen negative. - Hx of klebsiella in sputum - No concern for aspiration on speech evaluation 9/13  R  Pneumothorax: s/p chest tube placement on 9/12 -Suction increased 9/15, chest tube appears to be blocked 9/16 , I flushed at bedside was able to obtain air leak -We will repeat chest x-ray, if tension persists then will  need second chest tube     Protein Malnutrition/Anorexia - Appreciate nutrition recommendations -9/14 Dronabinol discontinued due to delirium  -9/15 Dobbhoff tube removed , will ask swallow to reassess  Depressed Affect -Patient may benefit from antidepressant therapy such as mirtazipine in the future after his acute illness has resolved  Diabetes  Mellitus Hypoglycemic event - in setting of restarting long acting insulin yesterday and poor oral intake - continue D5LR at 72mL/hr - SSI on hold -  Delirium In setting of critical illness and altered sleep wake cycle -Avoid benzodiazepines -Does not seem to need Precedex, Seroquel on hold since no enteral access  Best practice:  Diet: NPO Pain/Anxiety/Delirium protocol (if indicated): Monitor VAP protocol (if indicated): N/A DVT prophylaxis: SCDs GI prophylaxis: N/A Glucose control: Monitor Mobility: PT when able Code Status: DNR but intubation prn Family Communication:  daughter Disposition: ICU  Labs   CBC: Recent Labs  Lab 09/13/2020 1609 08/27/20 0623 08/27/20 1144 08/27/20 1144 08/27/20 2139 08/28/20 0311 08/29/20 0854 08/30/20 1230 08/31/20 0243  WBC 3.3*   < > 6.1  --   --  8.2 8.6 3.5* 4.9  NEUTROABS 1.1*  --   --   --   --   --   --  2.3  --   HGB 6.1*   < > 6.7*   < > 11.0* 11.1* 11.1* 10.9* 12.8*  HCT 20.0*   < > 21.1*   < > 33.9* 33.9* 33.4* 33.3* 39.5  MCV 104.2*   < > 97.7  --   --  89.2 86.3 87.4 88.4  PLT 126*   < > 131*  --   --  140* 117* 58* 66*   < > = values in this interval not displayed.    Basic Metabolic Panel: Recent Labs  Lab 09/08/2020 1958 09/11/2020 1958 08/27/20 4098 08/28/20 0311 08/29/20 0854 08/29/20 1641 08/30/20 1230 08/31/20 0243  NA 134*   < > 129* 130* 133*  --  135 135  K 5.1   < > 4.9 4.7 3.9  --  2.9* 2.6*  CL 108   < > 102 104 106  --  108 109  CO2 19*   < > 16* 14* 16*  --  17* 17*  GLUCOSE 85   < > 79 223* 184*  --  130* 91  BUN 29*   < > 33* 40* 50*  --  40* 35*  CREATININE 1.54*   < > 1.32* 1.16 1.34*  --  0.99 0.97  CALCIUM 8.8*   < > 8.3* 8.2* 8.3*  --  8.4* 8.6*  MG 1.6*  --  1.5* 2.6* 2.5*  --   --  1.9  PHOS  --   --  2.7  --   --  2.4*  --   --    < > = values in this interval not displayed.   GFR: Estimated Creatinine Clearance: 47 mL/min (by C-G formula based on SCr of 0.97 mg/dL). Recent Labs   Lab 08/18/2020 1633 08/30/2020 1958 08/27/20 0623 08/27/20 1144 08/27/20 1144 08/28/20 0311 08/29/20 0854 08/30/20 1230 08/31/20 0243  WBC  --   --    < > 6.1   < > 8.2 8.6 3.5* 4.9  LATICACIDVEN 2.1* 3.2*  --  1.5  --   --   --   --   --    < > = values in this interval not displayed.   My independent critical care time x 45m  Kara Mead MD. [facility].  Pulmonary & Critical care  If no response to pager , please call [phone]  After 7:00 pm call Elink  [phone]   08/31/2020

## 2020-08-31 NOTE — Progress Notes (Addendum)
CRITICAL VALUE ALERT  Critical Value:  K 2.6  Date & Time Notied:  08/31/20 @ 0320  Provider Notified: E-link   Orders Received/Actions taken: plan to replenish (8 Runs potassium IV for peripheral ordered.)   Discussed blood pressure again, will continue to monitor. (see new order from MD).

## 2020-08-31 NOTE — Progress Notes (Signed)
Assisted phlebotomist with morning venipuncture as patient very restless and difficult stick. After four attempts able to obtain adequate amount of blood for ordered labwork. Patient does have elevated blood pressure at this time. Unsure if due to restlessness and agitation or true reading.

## 2020-08-31 NOTE — Progress Notes (Signed)
[facility] Progress Note Patient Name: Tony Marshall DOB: 01-10-1941 MRN: [id number]   Date of Service  08/31/2020  HPI/Events of Note  Notified of hypertension. Current BP 186/91  HR 87. Seen calm and not agitated.  eICU Interventions  Hydralazine 10 mg IV prn ordered for SBP > 170     Intervention Category Major Interventions: Hypertension - evaluation and management  Shona Needles Tuvia Woodrick 08/31/2020, 4:30 AM

## 2020-08-31 NOTE — Progress Notes (Signed)
[facility] Progress Note Patient Name: Tony Marshall DOB: [birth year] MRN: [id number]   Date of Service  08/31/2020  HPI/Events of Note  Notified of CXR result with expanding pneumothorax despite suction to -40 Patient if hemodynamically stable if not slightly hypertensive  eICU Interventions  Placed on O2 support Dr Elsworth Soho made aware     Intervention Category Intermediate Interventions: Diagnostic test evaluation  Judd Lien 08/31/2020, 7:05 AM

## 2020-08-31 NOTE — Procedures (Signed)
Insertion of Chest Tube Procedure Note  Tony Marshall  [id number]  May 08, 1941  Date:08/31/20  Time:12:31 PM    Provider Performing: Leanna Sato. [name]   Procedure: Chest Tube Insertion (45859)  Indication(s) Pneumothorax  Tension pneumothorax with nonfunctioning pigtail catheter  Consent Risks of the procedure as well as the alternatives and risks of each were explained to the patient and/or caregiver.  Consent for the procedure was obtained and is signed in the bedside chart  Anesthesia Topical only with 1% lidocaine    Time Out Verified patient identification, verified procedure, site/side was marked, verified correct patient position, special equipment/implants available, medications/allergies/relevant history reviewed, required imaging and test results available.   Sterile Technique Maximal sterile technique including full sterile barrier drape, hand hygiene, sterile gown, sterile gloves, mask, hair covering, sterile ultrasound probe cover (if used).   Procedure Description Ultrasound not used to identify appropriate pleural anatomy for placement and overlying skin marked. Area of placement cleaned and draped in sterile fashion.  A 14 French pigtail pleural catheter was placed into the right pleural space using Seldinger technique. Appropriate return of air was obtained.  The tube was connected to atrium and placed on -40 cm H2O wall suction.   Complications/Tolerance None; patient tolerated the procedure well. Chest X-ray>> initially showed pneumothorax still present, vent chest tube placed to suction, pneumothorax was resolved   EBL Minimal  Specimen(s) none   Tony Ayo V. Elsworth Soho MD

## 2020-09-01 ENCOUNTER — Inpatient Hospital Stay (HOSPITAL_COMMUNITY): Payer: Medicare HMO

## 2020-09-01 DIAGNOSIS — E43 Unspecified severe protein-calorie malnutrition: Secondary | ICD-10-CM

## 2020-09-01 DIAGNOSIS — R410 Disorientation, unspecified: Secondary | ICD-10-CM

## 2020-09-01 DIAGNOSIS — J939 Pneumothorax, unspecified: Secondary | ICD-10-CM

## 2020-09-01 DIAGNOSIS — J189 Pneumonia, unspecified organism: Secondary | ICD-10-CM

## 2020-09-01 DIAGNOSIS — G9341 Metabolic encephalopathy: Secondary | ICD-10-CM

## 2020-09-01 DIAGNOSIS — E876 Hypokalemia: Secondary | ICD-10-CM

## 2020-09-01 LAB — CBC
HCT: 36.5 % — ABNORMAL LOW (ref 39.0–52.0)
Hemoglobin: 11.6 g/dL — ABNORMAL LOW (ref 13.0–17.0)
MCH: 28.7 pg (ref 26.0–34.0)
MCHC: 31.8 g/dL (ref 30.0–36.0)
MCV: 90.3 fL (ref 80.0–100.0)
Platelets: 33 10*3/uL — ABNORMAL LOW (ref 150–400)
RBC: 4.04 MIL/uL — ABNORMAL LOW (ref 4.22–5.81)
RDW: 23.7 % — ABNORMAL HIGH (ref 11.5–15.5)
WBC: 2.9 10*3/uL — ABNORMAL LOW (ref 4.0–10.5)
nRBC: 0 % (ref 0.0–0.2)

## 2020-09-01 LAB — BASIC METABOLIC PANEL
Anion gap: 10 (ref 5–15)
BUN: 33 mg/dL — ABNORMAL HIGH (ref 8–23)
CO2: 17 mmol/L — ABNORMAL LOW (ref 22–32)
Calcium: 8.1 mg/dL — ABNORMAL LOW (ref 8.9–10.3)
Chloride: 107 mmol/L (ref 98–111)
Creatinine, Ser: 0.86 mg/dL (ref 0.61–1.24)
GFR calc Af Amer: >60 mL/min (ref >60)
GFR calc non Af Amer: >60 mL/min (ref >60)
Glucose, Bld: 243 mg/dL — ABNORMAL HIGH (ref 70–99)
Potassium: 3 mmol/L — ABNORMAL LOW (ref 3.5–5.1)
Sodium: 134 mmol/L — ABNORMAL LOW (ref 135–145)

## 2020-09-01 LAB — CULTURE, BLOOD (SINGLE): Culture: NO GROWTH

## 2020-09-01 LAB — GLUCOSE, CAPILLARY
Glucose-Capillary: 201 mg/dL — ABNORMAL HIGH (ref 70–99)
Glucose-Capillary: 247 mg/dL — ABNORMAL HIGH (ref 70–99)
Glucose-Capillary: 324 mg/dL — ABNORMAL HIGH (ref 70–99)
Glucose-Capillary: 373 mg/dL — ABNORMAL HIGH (ref 70–99)
Glucose-Capillary: 244 mg/dL — ABNORMAL HIGH (ref 70–99)
Glucose-Capillary: 103 mg/dL — ABNORMAL HIGH (ref 70–99)

## 2020-09-01 LAB — MAGNESIUM: Magnesium: 1.5 mg/dL — ABNORMAL LOW (ref 1.7–2.4)

## 2020-09-01 LAB — PHOSPHORUS: Phosphorus: <1 mg/dL — CL (ref 2.5–4.6)

## 2020-09-01 MED ORDER — ADULT MULTIVITAMIN LIQUID CH
15.0000 mL | Freq: Every day | ORAL | Status: DC
  Filled 2020-09-01: qty 15

## 2020-09-01 MED ORDER — POTASSIUM CHLORIDE 20 MEQ/15ML (10%) PO SOLN
20.0000 meq | ORAL | Status: AC
  Administered 2020-09-01 (×2): 20 meq
  Filled 2020-09-01 (×3): qty 15

## 2020-09-01 MED ORDER — POTASSIUM CHLORIDE 10 MEQ/100ML IV SOLN
10.0000 meq | INTRAVENOUS | Status: AC
  Administered 2020-09-01 (×4): 10 meq via INTRAVENOUS
  Filled 2020-09-01 (×4): qty 100

## 2020-09-01 MED ORDER — GLUCERNA 1.2 CAL PO LIQD
1000.0000 mL | ORAL | Status: DC
  Filled 2020-09-01: qty 1000

## 2020-09-01 MED ORDER — MAGNESIUM SULFATE 2 GM/50ML IV SOLN
2.0000 g | Freq: Once | INTRAVENOUS | Status: AC
  Administered 2020-09-01: 2 g via INTRAVENOUS
  Filled 2020-09-01: qty 50

## 2020-09-01 MED ORDER — FREE WATER
200.0000 mL | Status: DC
  Administered 2020-09-01 – 2020-09-03 (×11): 200 mL

## 2020-09-01 MED ORDER — POTASSIUM PHOSPHATES 15 MMOLE/5ML IV SOLN
30.0000 mmol | Freq: Once | INTRAVENOUS | Status: AC
  Administered 2020-09-01: 30 mmol via INTRAVENOUS
  Filled 2020-09-01: qty 10

## 2020-09-01 MED ORDER — ALBUMIN HUMAN 5 % IV SOLN
25.0000 g | Freq: Once | INTRAVENOUS | Status: AC
  Administered 2020-09-01: 25 g via INTRAVENOUS
  Filled 2020-09-01: qty 500

## 2020-09-01 MED ORDER — SODIUM CHLORIDE 0.9% FLUSH
10.0000 mL | Freq: Three times a day (TID) | INTRAVENOUS | Status: DC
  Administered 2020-09-01 – 2020-09-11 (×28): 10 mL

## 2020-09-01 MED ORDER — INSULIN ASPART 100 UNIT/ML ~~LOC~~ SOLN
0.0000 [IU] | SUBCUTANEOUS | Status: DC
  Administered 2020-09-01: 5 [IU] via SUBCUTANEOUS
  Administered 2020-09-01: 15 [IU] via SUBCUTANEOUS
  Administered 2020-09-02 – 2020-09-03 (×4): 2 [IU] via SUBCUTANEOUS
  Administered 2020-09-03: 3 [IU] via SUBCUTANEOUS
  Administered 2020-09-03 (×2): 2 [IU] via SUBCUTANEOUS
  Administered 2020-09-04: 5 [IU] via SUBCUTANEOUS
  Administered 2020-09-04: 2 [IU] via SUBCUTANEOUS
  Administered 2020-09-04: 3 [IU] via SUBCUTANEOUS
  Administered 2020-09-04: 5 [IU] via SUBCUTANEOUS
  Administered 2020-09-04 – 2020-09-05 (×2): 2 [IU] via SUBCUTANEOUS
  Administered 2020-09-05: 3 [IU] via SUBCUTANEOUS
  Administered 2020-09-05 – 2020-09-06 (×2): 2 [IU] via SUBCUTANEOUS
  Administered 2020-09-06 (×3): 3 [IU] via SUBCUTANEOUS
  Administered 2020-09-06: 2 [IU] via SUBCUTANEOUS
  Administered 2020-09-07 (×2): 3 [IU] via SUBCUTANEOUS
  Administered 2020-09-07 (×2): 2 [IU] via SUBCUTANEOUS

## 2020-09-01 MED ORDER — INSULIN GLARGINE 100 UNIT/ML ~~LOC~~ SOLN
5.0000 [IU] | Freq: Every day | SUBCUTANEOUS | Status: DC
  Administered 2020-09-01 – 2020-09-07 (×7): 5 [IU] via SUBCUTANEOUS
  Filled 2020-09-01 (×8): qty 0.05

## 2020-09-01 MED ORDER — PROSOURCE TF PO LIQD
45.0000 mL | Freq: Two times a day (BID) | ORAL | Status: DC
  Filled 2020-09-01: qty 45

## 2020-09-01 MED ORDER — ADULT MULTIVITAMIN W/MINERALS CH
1.0000 | ORAL_TABLET | Freq: Every day | ORAL | Status: DC
  Administered 2020-09-01 – 2020-09-05 (×5): 1
  Filled 2020-09-01 (×5): qty 1

## 2020-09-01 MED ORDER — GLUCERNA 1.5 CAL PO LIQD
1000.0000 mL | ORAL | Status: DC
  Administered 2020-09-01: 1000 mL
  Filled 2020-09-01 (×8): qty 1000

## 2020-09-01 MED ORDER — LORAZEPAM 0.5 MG PO TABS: 0.5000 mg | ORAL_TABLET | Freq: Once | ORAL | Status: DC

## 2020-09-01 NOTE — Progress Notes (Signed)
[facility] ADULT ICU REPLACEMENT PROTOCOL   The patient does apply for the [facility] Adult ICU Electrolyte Replacment Protocol based on the criteria listed below:   1. Is GFR >/= 30 ml/min? Yes.    Patient's GFR today is .60  2. Is SCr </= 2? Yes.   Patient's SCr is 0.86 ml/kg/hr 3. Did SCr increase >/= 0.5 in 24 hours? No. 4. Abnormal electrolyte(s): K-3.0 5. Ordered repletion with: per protocol 6. If a panic level lab has been reported, has the CCM MD in charge been notified? Yes.  .   Physician:  Dr. Jonetta Speak, Philis Nettle 09/01/2020 5:35 AM

## 2020-09-01 NOTE — TOC Progression Note (Signed)
Transition of Care [facility]) - Progression Note    Patient Details  Name: Tony Marshall MRN: [id number] Date of Birth: 05-13-1941  Transition of [facility]) CM/SW Contact  Leeroy Cha, RN Phone Number: 09/01/2020, 8:59 AM  Clinical Narrative:    ches tubet inserted on [id number] connect to 40cm h20 wall suction,thoracentesis s/p hypotensive-iv albumin given, wbc 2.9 and platelets are 33. Following for progression Plan is return to home but may need snf.   Expected Discharge Plan: [place] Barriers to Discharge: Continued Medical Work up  Expected Discharge Plan and Services Expected Discharge Plan: [place]   Discharge Planning Services: CM Consult   Living arrangements for the past 2 months: Single Family Home                                       Social Determinants of Health (SDOH) Interventions    Readmission Risk Interventions Readmission Risk Prevention Plan 07/21/2020  Transportation Screening Complete  Medication Review Press photographer) Complete  PCP or Specialist appointment within 3-5 days of discharge Complete  HRI or [place] Complete  SW Recovery Care/Counseling Consult Complete  [place] Patient Refused  Some recent data might be hidden

## 2020-09-01 NOTE — Progress Notes (Addendum)
Nutrition Follow-up  DOCUMENTATION CODES:   Severe malnutrition in context of chronic illness, Underweight  INTERVENTION:  - will change multivitamin from liquid to pill form as this may help with osmotic diarrhea.  - will adjust TF regimen: Glucerna 1.5 @ 25 ml/hr to advance by 10 ml every 12 hours to reach goal rate of 45 ml/hr with 200 ml free water every 4 hours. - at goal rate, this regimen will provide 1620 kcal, 89 grams protein, 17 grams fiber, and 2020 ml free water.    - Monitor magnesium, potassium, and phosphorus daily for at least 3 days, MD to replete as needed, as pt is at risk for refeeding syndrome given severe malnutrition, current hypokalemia, hypomagnesemia, and severe hypophosphatemia.    NUTRITION DIAGNOSIS:   Severe Malnutrition related to chronic illness, cancer and cancer related treatments as evidenced by moderate fat depletion, moderate muscle depletion, severe fat depletion, severe muscle depletion. -ongoing  GOAL:   Patient will meet greater than or equal to 90% of their needs -to be met with TF regimen  MONITOR:   TF tolerance, PO intake, Labs, Weight trends  REASON FOR ASSESSMENT:   Consult Enteral/tube feeding initiation and management  ASSESSMENT:   79 year old man with medical history of afib not on anticoagulation, HFrEF, CLL s/p 3 cycles chemo, DM, HTN, recent PNA on 8/27 and had 2 courses of abx. He presented to the ED due to poor PO intake, shortness of breath, and lethargy. He has had a productive cough.  Patient sleeping in bed with no family or visitors present. Able to talk with RN outside of patient's room. Patient had small bore NGT placed on 9/15 evening but pulled it out overnight. It was able to be replaced yesterday and there has been no issue since that time. He has bilateral mittens in place. RN reports very watery stools since TF start yesterday. Patient has been refusing all PO items.   He has small bore NGT in R nare marked at  62 cm (abd xray shows tip in mid-stomach). He is receiving Osmolite 1.2 @ 50 ml/hr with 45 ml Prosource TF BID. This regimen is providing 1520 kcal, 89 grams protein, and 984 ml free water.   Weight significantly down since admission on 9/12; will continue to monitor. Will re-estimate nutrition needs based on information obtained in MD notes.   Notes indicate: - septic shock has resolved   - PNA, possible aspiration PNA although no aspiration noted by SLP on 9/13 - R pneumothorax s/p chest tube placement 9/12 - PCM with anorexia - delirium    Labs reviewed; CBGs: 247 and 324 mg/dl, Na: 134 mmol/l, K: 3 mmol/l, BUN: 33 mg/dl, Phos: <1 mg/dl, Mg: 1.5 mg/dl.  Medications reviewed; sliding scale novolog, 15 ml multivitamin/day, 40 mg IV protonix/day, 10 mEq IV KCl x8 runs 9/16 and x4 runs 9/17, 30 mmol IV KPhos x1 run 9/17.   Diet Order:   Diet Order            Diet regular Room service appropriate? Yes; Fluid consistency: Thin  Diet effective now                 EDUCATION NEEDS:   No education needs have been identified at this time  Skin:  Skin Assessment: Skin Integrity Issues: Skin Integrity Issues:: DTI DTI: sacrum and L side of nose  Last BM:  9/17 (type 7)  Height:   Ht Readings from Last 1 Encounters:  08/27/20 5' 7.5" (1.715 m)  Weight:   Wt Readings from Last 1 Encounters:  09/01/20 44.2 kg    Estimated Nutritional Needs:  Kcal:  1550-1770 kcal Protein:  70-85 grams Fluid:  >/= 1.8 L/day     Jarome Matin, MS, RD, LDN, CNSC Inpatient Clinical Dietitian RD pager # available in [facility]  After hours/weekend pager # available in [facility]

## 2020-09-01 NOTE — Progress Notes (Signed)
Inpatient Diabetes Program Recommendations  AACE/ADA: New Consensus Statement on Inpatient Glycemic Control (2015)  Target Ranges:  Prepandial:   less than 140 mg/dL      Peak postprandial:   less than 180 mg/dL (1-2 hours)      Critically ill patients:  140 - 180 mg/dL   Lab Results  Component Value Date   GLUCAP 324 (H) 09/01/2020   HGBA1C 5.8 (H) 08/28/2020    Review of Glycemic Control  Diabetes history: DM 2 Outpatient Diabetes medications: Tresiba 12 units Daily, Novolog 3-5 units tid, Actos 15 mg Daily Current orders for Inpatient glycemic control:  Lantus reduced to 5 units Daily Novolog 0-15 units Q4 hours  Inpatient Diabetes Program Recommendations:    Tube Feeds Glucerna 1.5 @ 45 ml/hr  Pt did not receive basal insulin yesterday trends slightly high today. Will watch on Lantus 5 units.   -  If glucose trends stay elevated after Lantus dose consider add Novolog Tube Feed Coverage Q4 in addition to correction scale.  Thanks,  Tama Headings RN, MSN, BC-ADM Inpatient Diabetes Coordinator Team Pager [phone] (8a-5p)

## 2020-09-01 NOTE — Progress Notes (Signed)
PROGRESS NOTE    Tony Marshall  HBZ:[id number] DOB: [birth year] DOA: 09/01/2020 PCP: Timoteo Gaul, FNP    Chief Complaint  Patient presents with  . Hypotension  . Weakness    Brief Narrative:  27 yowm never smoker with history of HFrEF and CLL s/p three cycles chemo and rx avelox since 8/27    for PNA who presented with lethargy and poor po intake.  Found to have worsening bibasilar airspace disease and pleural effusions with hypotension despite 3.5L IVF. PCCM consulted for admission.  Labs were significant for WBC 3.3, Hgb 6.1 down from baseline of 9 several weeks ago, Lactic acid 3.2, Covid-19 negative.   Course complicated by right pneumothorax after right IJ attempt   Assessment & Plan:   Active Problems:   CLL (chronic lymphocytic leukemia) (HCC)   Acute respiratory failure (HCC)   Acute combined systolic and diastolic heart failure (HCC)   Shock circulatory (HCC)   Septic shock (HCC)   Iatrogenic pneumothorax on R    AKI (acute kidney injury) ([place])   Pressure injury of skin   Pneumothorax on right   Pneumonia due to infectious organism   Severe protein-calorie malnutrition (HCC)   Hypokalemia   Hypomagnesemia   Hypophosphatemia   Acute metabolic encephalopathy  1 septic shock secondary to pneumonia and hypovolemia due to poor oral intake, POA Improving.  Patient noted to have bout of hypotension this morning which improved after a dose of IV albumin.  Chest x-ray done concerning for pneumonia.  Patient currently afebrile.  Patient has been off pressors since 08/30/2020 with some bouts of intermittent hypotension.  Continue IV cefepime to treat for total of 7 days.  Supportive care.  PCCM following.  2.  Pneumonia, possible aspiration pneumonia MRSA PCR negative.  Patient noted to have a history of Klebsiella in the sputum.  Patient seen by speech therapy who did not have any concerns for aspiration.  Afebrile.  Some clinical improvement.  Continue empiric IV  cefepime to complete a 7-day course of antibiotic treatment.  3.  Right-sided pneumothorax Status post chest tube placement 08/27/2020.  Is noted per PCCM that tube was nonfunctional and replaced 08/31/2020 with reexpansion of the lung.  Still with air leak.  Per PCCM.  4.  Severe protein calorie malnutrition Currently on tube feeds to supplement oral intake.  Follow.  5.  Acute kidney injury/metabolic acidosis Resolved.  6.  Well-controlled diabetes mellitus type 2 Hemoglobin A1c 5.8 (08/28/2020).  CBGs noted to be in the 200s to 300s.  Patient on tube feeds.  Patient noted to be on Tresiba 12 units daily, NovoLog 3 to 5 units 3 times daily, Actos 15 mg daily.  Patient currently with poor oral intake.  Place on Lantus 5 units daily.  Sliding scale insulin.  Follow.  7.  Hypophosphatemia/hypomagnesemia/hypokalemia Patient receiving IV runs of potassium, potassium via oral, magnesium sulfate 2 g IV x1, phosphorus 30 mmol IV x1.  Repeat labs in the morning.  Follow.  8.  Delirium/acute encephalopathy In the setting of critical illness and altered sleep-wake cycle.  Improving.  Avoid benzos.  PCCM recommendations.  Seroquel to be DC'd per PCCM recommendations.  Follow.  9.  CLL Status post 3 cycles chemotherapy.  Outpatient follow-up with oncology.  10.  Pancytopenia Likely secondary to sepsis/illness.  Patient with no overt bleeding.  Patient with history of CLL.  Oncology aware of admission.  Follow.  Transfusion threshold hemoglobin <7, platelet count < 10.    DVT  prophylaxis: SCDs Code Status: Partial Family Communication: No family at bedside. Disposition:   Status is: Inpatient    Dispo: The patient is from: Home              Anticipated d/c is to: To be determined              Anticipated d/c date is: To be determined              Patient currently with a pneumothorax, chest tube in place, NG tube in place receiving tube feeds, on IV antibiotics.  Not stable for  discharge.       Consultants:   PCCM admission  Procedures:   Chest x-ray 9/12 RIJ attempted, R pigtail chest tube by ED >> 9/16 removed since non functional 9/12 L Fem CVL >> 9/14 , pulled out 9/16 RT pigtail >>  Significant Hospital Events   9/11 Admit to PCCM 9/15 weaned off vasopressors ,increasing delirium, pulled out his central line      Antimicrobials:   IV cefepime 08/27/2020>>>>> 09/03/2020  IV Zosyn x1 dose 09/09/2020  IV vancomycin 08/18/2020>>>> 08/28/2020   Subjective: Patient sleeping but arousable.  Denies any significant shortness of breath.  No chest pain.  No abdominal pain.  On tube feeds.  Rectal tube with loose stool.  Events overnight noted with patient with a bout of hypotension and received IV albumin.  Objective: Vitals:   09/01/20 0500 09/01/20 0600 09/01/20 0800 09/01/20 1200  BP: (!) 97/47 (!) 106/56 (!) 149/79 97/66  Pulse: 82 77 88 69  Resp: 16 (!) 24 17 17   Temp:   98.3 F (36.8 C) (!) 97 F (36.1 C)  TempSrc:   Oral Axillary  SpO2: 98% 98% 98% 99%  Weight:      Height:        Intake/Output Summary (Last 24 hours) at 09/01/2020 1556 Last data filed at 09/01/2020 1500 Gross per 24 hour  Intake 3029.88 ml  Output 802 ml  Net 2227.88 ml   Filed Weights   08/30/20 0437 08/31/20 0404 09/01/20 0145  Weight: 54.5 kg 51.3 kg 44.2 kg    Examination:  General exam: Sleeping but arousable. Respiratory system: Clear to auscultation anterior lung fields. Respiratory effort normal. Cardiovascular system: S1 & S2 heard, RRR. No JVD, murmurs, rubs, gallops or clicks. No pedal edema.  Right-sided chest tube in place. Gastrointestinal system: Abdomen is nondistended, soft and nontender. No organomegaly or masses felt. Normal bowel sounds heard. Central nervous system: Asleep but arousable. No focal neurological deficits.  Moving extremities spontaneously. Extremities: Symmetric 5 x 5 power. Skin: No rashes, lesions or ulcers Psychiatry:  Judgement and insight appear normal. Mood & affect appropriate.     Data Reviewed: I have personally reviewed following labs and imaging studies  CBC: Recent Labs  Lab 08/23/2020 1609 08/27/20 0623 08/28/20 0311 08/29/20 0854 08/30/20 1230 08/31/20 0243 09/01/20 0253  WBC 3.3*   < > 8.2 8.6 3.5* 4.9 2.9*  NEUTROABS 1.1*  --   --   --  2.3  --   --   HGB 6.1*   < > 11.1* 11.1* 10.9* 12.8* 11.6*  HCT 20.0*   < > 33.9* 33.4* 33.3* 39.5 36.5*  MCV 104.2*   < > 89.2 86.3 87.4 88.4 90.3  PLT 126*   < > 140* 117* 58* 66* 33*   < > = values in this interval not displayed.    Basic Metabolic Panel: Recent Labs  Lab 08/27/20 [phone]  08/27/20 8469 08/28/20 0311 08/29/20 0854 08/29/20 1641 08/30/20 1230 08/31/20 0243 09/01/20 0253  NA 129*   < > 130* 133*  --  135 135 134*  K 4.9   < > 4.7 3.9  --  2.9* 2.6* 3.0*  CL 102   < > 104 106  --  108 109 107  CO2 16*   < > 14* 16*  --  17* 17* 17*  GLUCOSE 79   < > 223* 184*  --  130* 91 243*  BUN 33*   < > 40* 50*  --  40* 35* 33*  CREATININE 1.32*   < > 1.16 1.34*  --  0.99 0.97 0.86  CALCIUM 8.3*   < > 8.2* 8.3*  --  8.4* 8.6* 8.1*  MG 1.5*  --  2.6* 2.5*  --   --  1.9 1.5*  PHOS 2.7  --   --   --  2.4*  --   --  <1.0*   < > = values in this interval not displayed.    GFR: Estimated Creatinine Clearance: 45.7 mL/min (by C-G formula based on SCr of 0.86 mg/dL).  Liver Function Tests: Recent Labs  Lab 08/30/2020 1958  AST 14*  ALT 9  ALKPHOS 56  BILITOT 0.2*  PROT 4.5*  ALBUMIN 1.9*    CBG: Recent Labs  Lab 08/31/20 2318 09/01/20 0334 09/01/20 0746 09/01/20 1144 09/01/20 1551  GLUCAP 201* 247* 324* 373* 244*     Recent Results (from the past 240 hour(s))  Culture, Urine     Status: Abnormal   Collection Time: 08/30/2020  3:49 PM   Specimen: Urine, Random  Result Value Ref Range Status   Specimen Description   Final    URINE, RANDOM Performed at [facility], [place] [address] [place],  [place] [address]    Special Requests   Final    NONE Performed at [facility], [place] [address] [place], [place] [address]    Culture (A)  Final    <10,000 COLONIES/mL INSIGNIFICANT GROWTH Performed at [place] [address], [place], [place] [address]    Report Status 08/28/2020 FINAL  Final  SARS Coronavirus 2 by RT PCR (hospital order, performed in [facility] hospital lab) Nasopharyngeal Nasopharyngeal Swab     Status: None   Collection Time: 08/24/2020  4:10 PM   Specimen: Nasopharyngeal Swab  Result Value Ref Range Status   SARS Coronavirus 2 NEGATIVE NEGATIVE Final    Comment: (NOTE) SARS-CoV-2 target nucleic acids are NOT DETECTED.  The SARS-CoV-2 RNA is generally detectable in upper and lower respiratory specimens during the acute phase of infection. The lowest concentration of SARS-CoV-2 viral copies this assay can detect is 250 copies / mL. A negative result does not preclude SARS-CoV-2 infection and should not be used as the sole basis for treatment or other patient management decisions.  A negative result may occur with improper specimen collection / handling, submission of specimen other than nasopharyngeal swab, presence of viral mutation(s) within the areas targeted by this assay, and inadequate number of viral copies (<250 copies / mL). A negative result must be combined with clinical observations, patient history, and epidemiological information.  Fact Sheet for Patients:   StrictlyIdeas.no  Fact Sheet for Healthcare Providers: BankingDealers.co.za  This test is not yet approved or  cleared by the [place] FDA and has been authorized for detection and/or diagnosis of SARS-CoV-2 by FDA under an Emergency Use  Authorization (EUA).  This EUA will remain in effect (meaning this test can be used) for the duration of the COVID-19 declaration under Section 564(b)(1) of the Act, 21  U.S.C. section 360bbb-3(b)(1), unless the authorization is terminated or revoked sooner.  Performed at [facility], [name] [address], [place], [place] [address]   Expectorated sputum assessment w rflx to resp cult     Status: None   Collection Time: 08/27/20  7:17 AM   Specimen: Sputum  Result Value Ref Range Status   Specimen Description SPUTUM  Final   Special Requests NONE  Final   Sputum evaluation   Final    SPECIMEN MICROSCOPICALLY RESEMBLES SALIVA, NOT CULTURED Sputum specimen not acceptable for testing.  Please recollect.   Performed at [facility], [place] [address], [place], [place] [address]    Report Status 08/28/2020 FINAL  Final  MRSA PCR Screening     Status: None   Collection Time: 08/27/20 11:44 AM   Specimen: Nasopharyngeal  Result Value Ref Range Status   MRSA by PCR NEGATIVE NEGATIVE Final    Comment:        The GeneXpert MRSA Assay (FDA approved for NASAL specimens only), is one component of a comprehensive MRSA colonization surveillance program. It is not intended to diagnose MRSA infection nor to guide or monitor treatment for MRSA infections. Performed at [facility], [place] [address], [place], [place] [address]   Culture, blood (single)     Status: None   Collection Time: 08/27/20 12:12 PM   Specimen: Left Antecubital; Blood  Result Value Ref Range Status   Specimen Description   Final    LEFT ANTECUBITAL Performed at [place] [address], [place], [place] [address]    Special Requests   Final    BOTTLES DRAWN AEROBIC ONLY Blood Culture results may not be optimal due to an inadequate volume of blood received in culture bottles Performed at [place] [address] [place], [place] [address]    Culture   Final    NO GROWTH 5 DAYS Performed at [facility], [place] [address] [place], [place] [address]    Report Status 09/01/2020 FINAL   Final         Radiology Studies: DG Abd 1 View  Result Date: 08/31/2020 CLINICAL DATA:  Encounter for feeding tube placement EXAM: ABDOMEN - 1 VIEW COMPARISON:  Abdominal radiograph of 08/30/2020 FINDINGS: RIGHT-sided chest tube remains in place. Small amount of subcutaneous emphysema. Additional chest tube has been placed since previous chest radiograph. Tip of this chest tube cannot be seen, better seen on subsequent chest radiograph. The RIGHT hemidiaphragm no longer shows depression and lucency that was noted on the previous exam. There is RIGHT and LEFT basilar airspace disease likely with LEFT-sided pleural fluid layering dependently. IMPRESSION: 1. Interval improvement with respect to basilar portion of pneumothorax seen on previous imaging following placement of a second chest tube. 2. Effusion and basilar airspace disease on the LEFT. 3. Feeding tube in the mid stomach. Electronically Signed   By: Zetta Bills M.D.   On: 08/31/2020 11:26   DG Abd 1 View  Result Date: 08/30/2020 CLINICAL DATA:  Nasogastric tube placement EXAM: ABDOMEN - 1 VIEW COMPARISON:  5:46 p.m. FINDINGS: Nasoenteric feeding tube has been partially withdrawn and its tip is now seen just beyond the expected gastroesophageal junction. Visualized abdominal gas pattern is unremarkable. Small left pleural effusion is present. Right  basilar pigtail chest tube is unchanged in position. There is depression of the right hemidiaphragm in keeping with the presence of a large right pneumothorax, however, the degree of diaphragmatic depression and right lung hyperinflation has improved when compared to prior examination suggesting at least partial decompression through the chest tube. IMPRESSION: 1. Nasoenteric feeding tube has been partially withdrawn and its tip is now seen just beyond the expected gastroesophageal junction. Advancement of the catheter by at least 20 cm may more optimally position the catheter within the duodenum.  2. Improved right-sided pneumothorax, however with persistent diaphragmatic depression and right lung hyperinflation. Correlation for appropriate function of the chest tube is recommended. Electronically Signed   By: Fidela Salisbury MD   On: 08/30/2020 21:47   DG Chest Port 1 View  Result Date: 09/01/2020 CLINICAL DATA:  Pneumothorax EXAM: PORTABLE CHEST 1 VIEW COMPARISON:  08/31/2020 FINDINGS: Interval removal of 1 of the 2 right chest tubes. No visible pneumothorax. Airspace disease throughout the left lung with possible layering left effusion, unchanged. Heart is borderline in size. IMPRESSION: Interval removal of 1 of the 2 right chest tubes without visible pneumothorax. Stable hazy opacity throughout the left lung, likely layering effusion and airspace disease. Electronically Signed   By: Rolm Baptise M.D.   On: 09/01/2020 04:46   DG CHEST PORT 1 VIEW  Result Date: 08/31/2020 CLINICAL DATA:  Encounter for chest tube placement EXAM: PORTABLE CHEST 1 VIEW COMPARISON:  Earlier today FINDINGS: A new chest tube has been placed with tip at the apex. The right base tube is in stable position. Significant evacuation of right pneumothorax with no definite residual seen. Hazy opacification of the left chest from pleural fluid and pulmonary density. Feeding tube has been placed with indwelling wire and weight over the stomach. Stable heart size. IMPRESSION: 1. Additional right chest tube with evacuated pneumothorax. 2. Continued hazy opacification of the left chest, likely both pleural fluid and pulmonary opacity. Electronically Signed   By: Monte Fantasia M.D.   On: 08/31/2020 11:23   DG CHEST PORT 1 VIEW  Result Date: 08/31/2020 CLINICAL DATA:  Pneumothorax EXAM: PORTABLE CHEST 1 VIEW COMPARISON:  August 31, 2020 FINDINGS: The cardiomediastinal silhouette is unchanged in contour.Atherosclerotic calcifications of the aorta. Small LEFT pleural effusion. Persistent moderate RIGHT pneumothorax, minimally  decreased in comparison to prior. Persistent opacification of the RIGHT lung along the RIGHT lower hilar border. Mildly improved aeration of the LEFT lung. Patchy areas of atelectasis and edema throughout the LEFT lung. Visualized abdomen is unremarkable. Multilevel degenerative changes of the thoracic spine. IMPRESSION: 1. Persistent moderate RIGHT pneumothorax, minimally decreased to similar in comparison to prior. 2. Persistent opacification of the RIGHT lung along the RIGHT lower hilar border, likely atelectasis. 3. Mildly improved aeration of the LEFT lung. Electronically Signed   By: Valentino Saxon MD   On: 08/31/2020 09:18   DG CHEST PORT 1 VIEW  Result Date: 08/31/2020 CLINICAL DATA:  Follow-up pneumothorax. EXAM: PORTABLE CHEST 1 VIEW COMPARISON:  08/29/2020 FINDINGS: The right-sided chest tube is stable in position. Enlarging right pneumothorax now estimated at 50%. Mild shift of the heart to the left side and lower left lung volumes with progressive left lung atelectasis. IMPRESSION: Enlarging right pneumothorax now estimated at 50%. Some component of a tension pneumothorax is noted with slight shift of the heart to the left and lower left lung volumes with compressive atelectasis. These results will be called to the ordering clinician or representative by the Radiologist Assistant, and communication  documented in the PACS or [employer]. Electronically Signed   By: Marijo Sanes M.D.   On: 08/31/2020 06:41   DG Abd Portable 1V  Result Date: 08/30/2020 CLINICAL DATA:  Feeding tube placement EXAM: PORTABLE ABDOMEN-1 VIEW COMPARISON:  X-ray abdomen 07/07/2020, chest x-ray 08/29/2020 FINDINGS: Interval placement of enteric tube noted to course below the diaphragm with tip overlying the gastric lumen. Right pigtail catheter overlying the right mid hemithorax. Interval increase in size of a large right pneumothorax. Small left pleural effusion. Severe coronary artery calcification. IMPRESSION:  1. Enteric tube coursing below the diaphragm with tip overlying the expected region of the gastric lumen. 2. Interval increase in size of a large pneumothorax (compared to chest x-ray 08/29/2020) with right chest tube pigtail catheter in similar position. 3. Small left pleural effusion. Electronically Signed   By: Iven Finn M.D.   On: 08/30/2020 19:32        Scheduled Meds: . Chlorhexidine Gluconate Cloth  6 each Topical Daily  . free water  200 mL Per Tube Q4H  . insulin aspart  0-15 Units Subcutaneous Q4H  . insulin glargine  5 Units Subcutaneous Daily  . mouth rinse  15 mL Mouth Rinse BID  . multivitamin with minerals  1 tablet Per Tube Daily  . pantoprazole (PROTONIX) IV  40 mg Intravenous Q24H  . sodium chloride flush  10 mL Intracatheter Q8H  . sodium chloride flush  10-40 mL Intracatheter Q12H   Continuous Infusions: . sodium chloride 10 mL/hr at 09/01/20 1500  . ceFEPime (MAXIPIME) IV Stopped (09/01/20 0222)  . feeding supplement (GLUCERNA 1.5 CAL) 1,000 mL (09/01/20 1126)  . potassium PHOSPHATE IVPB (in mmol) 85 mL/hr at 09/01/20 1500     LOS: 5 days    Time spent: 35 minutes    Irine Seal, MD [facility]   To contact the attending provider between 7A-7P or the covering provider during after hours 7P-7A, please log into the web site [email or web address] and access using universal [place] password for that web site. If you do not have the password, please call the hospital operator.  09/01/2020, 3:56 PM

## 2020-09-01 NOTE — Progress Notes (Signed)
Patient transported to [facility], report given to [place], Therapist, sports. Patient's chest tube connected to suction and demonstrated RN on how to flush chest tube per order. Patient's wife aware of transfer and at bedside.

## 2020-09-01 NOTE — Progress Notes (Signed)
Patient received from 2W/ICU/SDU. Patient is alert and oriented X3, pt's wife at bedside. Pt's vital signs are stable, chest tube connected to wall suction. Pt denies any complaint at this time. Assessment deferred to oncoming RN.

## 2020-09-01 NOTE — Progress Notes (Signed)
eLink Physician-Brief Progress Note Patient Name: Tony Marshall DOB: 03-03-1941 MRN: [id number]   Date of Service  09/01/2020  HPI/Events of Note  Patient with hypotension s/p large volume thoracentesis recently.  eICU Interventions  Albumin 5 % 500 ml iv x 1.        Kerry Kass Shaleen Talamantez 09/01/2020, 3:08 AM

## 2020-09-01 NOTE — Progress Notes (Signed)
NAME:  SENDER RUEB, MRN:  [id number], DOB:  1941-12-02, LOS: 5 ADMISSION DATE:  09/07/2020, CONSULTATION DATE:  09/01/20 REFERRING MD:  EDP, CHIEF COMPLAINT:  lethargy  Brief History   38 yowm never smoker with history of HFrEF and CLL s/p three cycles chemo and rx avelox since 8/27    for PNA who presented with lethargy and poor po intake.  Found to have worsening bibasilar airspace disease and pleural effusions with hypotension despite 3.5L IVF. PCCM consulted for admission.  Labs were significant for WBC 3.3, Hgb 6.1 down from baseline of 9 several weeks ago, Lactic acid 3.2, Covid-19 negative.   Course complicated by right pneumothorax after right IJ attempt  Past Medical History  Atrial fibrillation not on anticoagulation  HFrEF CLL s/p 3 cycles chemo DM HTN GERD  OSA  Significant Hospital Events   9/11 Admit to PCCM 9/15 weaned off vasopressors ,increasing delirium, pulled out his central line   Consults:  None  Procedures:  9/12 RIJ attempted, R pigtail chest tube by ED >> 9/16 removed since non functional 9/12 L Fem CVL >> 9/14 , pulled out 9/16 RT pigtail >>  Significant Diagnostic Tests:     Micro Data:  Sars-Cov-2 PCR 9/11 >negative Sputum culture  9/12 > Blood Cx  9/12>ng Urine Cx  9/12> insig growth  Antimicrobials:  Zozyn 9/11 only Vancomycin 9/11> Cefepime 9/12>  Interim history/subjective:   Hypotensive overnight, given albumin More awake and alert this morning Chest tube has airleak to suction   Objective   Blood pressure (!) 106/56, pulse 77, temperature 98.3 F (36.8 C), temperature source Oral, resp. rate (!) 24, height 5' 7.5" (1.715 m), weight 44.2 kg, SpO2 98 %.        Intake/Output Summary (Last 24 hours) at 09/01/2020 0925 Last data filed at 09/01/2020 0900 Gross per 24 hour  Intake 2804.85 ml  Output 1252 ml  Net 1552.85 ml   Exam: Gen:      elderly man, no distress  HEENT:  EOMI, sclera anicteric, mild pallor Neck:      No JVD; no thyromegaly Lungs:    No accessory muscle use, decreased breath sounds on right CV:         Regular rate and rhythm; no murmurs Abd:      + bowel sounds; soft, non-tender; no palpable masses, no distension Ext:    No edema; adequate peripheral perfusion Skin:      Warm and dry; no rash Neuro: alert and oriented to name and place   Labs show hypokalemia, hyponatremia, hypophosphatemia and hypomagnesemia, leukopenia  I personally reviewed images and agree with radiology impression as follows:  CXR:   9/17 , right lung is now inflated, no residual pneumothorax  Resolved problems  AKI, metabolic acidosis without anion gap, very mild lactic acidosis: Baseline around 0.7, admission Cr 1.54  Assessment & Plan:  78 year old man with history of HFrEF and CLL s/p three cycles chemo with chronic dysphagia and on Avelox since 8/27  for PNA who presented with lethargy and poor po intake.  Found to have worsening bibasilar airspace disease and pleural effusions with hypotension despite 3.5L IVF.   Septic shock-resolved - concern for pneumonia and hypovolemic due to poor oral intake - Cultures negative -Plan for cefepime x 7 days total -  off pressors since 9/15 , some intermittent hypotension not responsive to volume  Pneumonia, possible aspiration pneumonia - Covering with cefepime  - MRSA screen negative. - Hx of klebsiella  in sputum - No concern for aspiration on speech evaluation 9/13  R  Pneumothorax: s/p chest tube placement on 9/12 -This tube was nonfunctional and replaced on 9/16 with reexpansion of lung -Airleak persists of continuous suction 20 cm, flush catheter every 8 hours   Protein Malnutrition/Anorexia -On tube feeds to supplement oral intake  Diabetes Mellitus Hypoglycemic event - in setting of restarting long acting insulin and poor oral intake - dc D5LR  - SSI on hold, defer further management to triad -  Delirium In setting of critical illness and  altered sleep wake cycle , much improved -Avoid benzodiazepines -Seroquel can be discontinued  Hypokalemia, hypophosphatemia and hypomagnesemia will be repleted. PCCM will continue to follow chest tube, defer to triad for other management  Best practice:  Diet: NPO Pain/Anxiety/Delirium protocol (if indicated): Monitor VAP protocol (if indicated): N/A DVT prophylaxis: SCDs GI prophylaxis: N/A Glucose control: Monitor Mobility: PT when able Code Status: DNR but intubation prn Family Communication:  daughter Disposition: ICU  Labs   CBC: Recent Labs  Lab 08/29/2020 1609 08/27/20 0623 08/28/20 0311 08/29/20 0854 08/30/20 1230 08/31/20 0243 09/01/20 0253  WBC 3.3*   < > 8.2 8.6 3.5* 4.9 2.9*  NEUTROABS 1.1*  --   --   --  2.3  --   --   HGB 6.1*   < > 11.1* 11.1* 10.9* 12.8* 11.6*  HCT 20.0*   < > 33.9* 33.4* 33.3* 39.5 36.5*  MCV 104.2*   < > 89.2 86.3 87.4 88.4 90.3  PLT 126*   < > 140* 117* 58* 66* 33*   < > = values in this interval not displayed.    Basic Metabolic Panel: Recent Labs  Lab 08/27/20 0623 08/27/20 0623 08/28/20 0311 08/29/20 0854 08/29/20 1641 08/30/20 1230 08/31/20 0243 09/01/20 0253  NA 129*   < > 130* 133*  --  135 135 134*  K 4.9   < > 4.7 3.9  --  2.9* 2.6* 3.0*  CL 102   < > 104 106  --  108 109 107  CO2 16*   < > 14* 16*  --  17* 17* 17*  GLUCOSE 79   < > 223* 184*  --  130* 91 243*  BUN 33*   < > 40* 50*  --  40* 35* 33*  CREATININE 1.32*   < > 1.16 1.34*  --  0.99 0.97 0.86  CALCIUM 8.3*   < > 8.2* 8.3*  --  8.4* 8.6* 8.1*  MG 1.5*  --  2.6* 2.5*  --   --  1.9 1.5*  PHOS 2.7  --   --   --  2.4*  --   --  <1.0*   < > = values in this interval not displayed.   GFR: Estimated Creatinine Clearance: 45.7 mL/min (by C-G formula based on SCr of 0.86 mg/dL). Recent Labs  Lab 09/13/2020 1633 09/12/2020 1958 08/27/20 0623 08/27/20 1144 08/28/20 0311 08/29/20 0854 08/30/20 1230 08/31/20 0243 09/01/20 0253  WBC  --   --    < > 6.1   < >  8.6 3.5* 4.9 2.9*  LATICACIDVEN 2.1* 3.2*  --  1.5  --   --   --   --   --    < > = values in this interval not displayed.     Kara Mead MD. [facility]. [facility]  If no response to pager , please call [phone]  After 7:00 pm  call Elink  [phone]   09/01/2020

## 2020-09-01 NOTE — Progress Notes (Signed)
CRITICAL VALUE ALERT  Critical Value:  Phosphorus <1.0  Date & Time Notied:  0840 09/01/2020   Provider Notified: yes  Orders Received/Actions taken: MD aware MD to place new orders

## 2020-09-02 ENCOUNTER — Inpatient Hospital Stay (HOSPITAL_COMMUNITY): Payer: Medicare HMO

## 2020-09-02 DIAGNOSIS — J939 Pneumothorax, unspecified: Secondary | ICD-10-CM

## 2020-09-02 LAB — BASIC METABOLIC PANEL
Anion gap: 8 (ref 5–15)
BUN: 26 mg/dL — ABNORMAL HIGH (ref 8–23)
CO2: 14 mmol/L — ABNORMAL LOW (ref 22–32)
Calcium: 7.7 mg/dL — ABNORMAL LOW (ref 8.9–10.3)
Chloride: 111 mmol/L (ref 98–111)
Creatinine, Ser: 0.67 mg/dL (ref 0.61–1.24)
GFR calc Af Amer: >60 mL/min (ref >60)
GFR calc non Af Amer: >60 mL/min (ref >60)
Glucose, Bld: 164 mg/dL — ABNORMAL HIGH (ref 70–99)
Potassium: 3.7 mmol/L (ref 3.5–5.1)
Sodium: 133 mmol/L — ABNORMAL LOW (ref 135–145)

## 2020-09-02 LAB — GLUCOSE, CAPILLARY
Glucose-Capillary: 127 mg/dL — ABNORMAL HIGH (ref 70–99)
Glucose-Capillary: 138 mg/dL — ABNORMAL HIGH (ref 70–99)
Glucose-Capillary: 139 mg/dL — ABNORMAL HIGH (ref 70–99)
Glucose-Capillary: 139 mg/dL — ABNORMAL HIGH (ref 70–99)
Glucose-Capillary: 86 mg/dL (ref 70–99)
Glucose-Capillary: 99 mg/dL (ref 70–99)

## 2020-09-02 LAB — PHOSPHORUS: Phosphorus: 1.3 mg/dL — ABNORMAL LOW (ref 2.5–4.6)

## 2020-09-02 LAB — MAGNESIUM: Magnesium: 1.6 mg/dL — ABNORMAL LOW (ref 1.7–2.4)

## 2020-09-02 MED ORDER — KETOROLAC TROMETHAMINE 30 MG/ML IJ SOLN
30.0000 mg | Freq: Four times a day (QID) | INTRAMUSCULAR | Status: DC | PRN
  Administered 2020-09-02 – 2020-09-04 (×3): 30 mg via INTRAVENOUS
  Filled 2020-09-02 (×3): qty 1

## 2020-09-02 MED ORDER — MAGNESIUM SULFATE 4 GM/100ML IV SOLN
4.0000 g | Freq: Once | INTRAVENOUS | Status: AC
  Administered 2020-09-02: 4 g via INTRAVENOUS
  Filled 2020-09-02: qty 100

## 2020-09-02 MED ORDER — POTASSIUM PHOSPHATES 15 MMOLE/5ML IV SOLN
30.0000 mmol | Freq: Once | INTRAVENOUS | Status: AC
  Administered 2020-09-02: 30 mmol via INTRAVENOUS
  Filled 2020-09-02: qty 10

## 2020-09-02 MED ORDER — KETOROLAC TROMETHAMINE 30 MG/ML IJ SOLN
15.0000 mg | Freq: Four times a day (QID) | INTRAMUSCULAR | Status: DC | PRN
  Administered 2020-09-02: 15 mg via INTRAVENOUS
  Filled 2020-09-02: qty 1

## 2020-09-02 MED ORDER — STERILE WATER FOR INJECTION IV SOLN
INTRAVENOUS | Status: DC
  Filled 2020-09-02 (×3): qty 150
  Filled 2020-09-02 (×2): qty 850

## 2020-09-02 NOTE — Progress Notes (Signed)
NAME:  Tony Marshall, MRN:  [id number], DOB:  07/02/1941, LOS: 6 ADMISSION DATE:  08/28/2020, CONSULTATION DATE:  09/02/20 REFERRING MD:  EDP, CHIEF COMPLAINT:  lethargy  Brief History   79 yowm never smoker with history of HFrEF and CLL s/p three cycles chemo and rx avelox since 8/27    for PNA who presented with lethargy and poor po intake.  Found to have worsening bibasilar airspace disease and pleural effusions with hypotension despite 3.5L IVF. PCCM consulted for admission.  Labs were significant for WBC 3.3, Hgb 6.1 down from baseline of 9 several weeks ago, Lactic acid 3.2, Covid-19 negative.   Course complicated by right pneumothorax after right IJ attempt  Past Medical History  Atrial fibrillation not on anticoagulation  HFrEF CLL s/p 3 cycles chemo DM HTN GERD  OSA  Significant Hospital Events   9/11 Admit to PCCM 9/15 weaned off vasopressors ,increasing delirium, pulled out his central line   Consults:  None  Procedures:  9/12 RIJ attempted, R pigtail chest tube by ED >> 9/16 removed since non functional 9/12 L Fem CVL >> 9/14 , pulled out 9/16 RT pigtail >>  Significant Diagnostic Tests:     Micro Data:  Sars-Cov-2 PCR 9/11 >negative Sputum culture  9/12 > Blood Cx  9/12>ng Urine Cx  9/12> insig growth  Antimicrobials:  Zozyn 9/11 only Vancomycin 9/11> Cefepime 9/12>  Interim history/subjective:   Informed by radiology that pneumothorax is larger Chest x-ray personally reviewed Examined chest tube with continuous air leak.  Noted that Pleur-evac is not screwed in properly to the chest tube.  This was fixed and increase in suction to 40  Now he has tidling of pleural fluid in the chest tube with intermittent leak when he coughs or takes a deep breath indicating that the tube is functional now with no deficit in circuit   Objective   Blood pressure 133/83, pulse 92, temperature 98.3 F (36.8 C), temperature source Oral, resp. rate 20, height  5' 7.5" (1.715 m), weight (P) 55.3 kg, SpO2 98 %.        Intake/Output Summary (Last 24 hours) at 09/02/2020 0811 Last data filed at 09/02/2020 0600 Gross per 24 hour  Intake 1880.11 ml  Output 1700 ml  Net 180.11 ml   Exam: Gen:      No acute distress, frail, elderly man in moderate pain on manipulation of chest tube HEENT:  EOMI, sclera anicteric Neck:     No masses; no thyromegaly Lungs:    Clear to auscultation bilaterally; normal respiratory effort CV:         Regular rate and rhythm; no murmurs Abd:      + bowel sounds; soft, non-tender; no palpable masses, no distension Ext:    No edema; adequate peripheral perfusion Skin:      Warm and dry; no rash Neuro: alert and oriented x 3 Psych: normal mood and affect   Chest x-ray 9/18-increase in right pneumothorax.  Apical chest tube in place.  Basal chest tube has been removed  Resolved problems  AKI, metabolic acidosis without anion gap, very mild lactic acidosis: Baseline around 0.7, admission Cr 1.54  Assessment & Plan:  79 year old man with history of HFrEF and CLL s/p three cycles chemo with chronic dysphagia and on Avelox since 8/27  for PNA who presented with lethargy and poor po intake.  Found to have worsening bibasilar airspace disease and pleural effusions with hypotension despite 3.5L IVF.   Septic shock-resolved Pneumonia,  possible aspiration pneumonia Treating for pneumonia Continue cefepime for 7 days Monitor off pressors Hx of klebsiella in sputum blood cultures during this admission are negative No concern for aspiration on speech evaluation 9/13  R  Pneumothorax: s/p chest tube placement on 9/12 -This tube was nonfunctional and replaced on 9/16 with reexpansion of lung Enlarging pneumothorax today morning.  Nonfunctional chest tube fixed as noted above Repeat chest x-ray in 2 hours.  Protein Malnutrition/Anorexia -On tube feeds to supplement oral intake  Diabetes Mellitus Hypoglycemic event Monitor  sugars -  Delirium In setting of critical illness and altered sleep wake cycle , much improved -Avoid benzodiazepines  PCCM will continue to follow chest tube, defer to triad for other management  Best practice:  Per primary team  Marshell Garfinkel MD [facility] Please see [email or web address] for pager details.  09/02/2020, 8:17 AM

## 2020-09-02 NOTE — Progress Notes (Addendum)
Received call from Radiology. MD- Dr. Clovis Riley  Informed Charge nurse that patient's rt side  Pneumothorax has reoccurred- Findings reflect pneumothorax to be small to moderate. Will inform assigned nurse results and make attending physician aware.  9/18 @ 0703 made assigned nurse aware of results from radiology. Assigned day shift nurse agreed to inform attending MD of findings.

## 2020-09-02 NOTE — Progress Notes (Signed)
PROGRESS NOTE    Tony Marshall  FUX:[id number] DOB: 05/01/1941 DOA: 08/30/2020 PCP: Timoteo Gaul, FNP    Chief Complaint  Patient presents with  . Hypotension  . Weakness    Brief Narrative:  28 yowm never smoker with history of HFrEF and CLL s/p three cycles chemo and rx avelox since 8/27    for PNA who presented with lethargy and poor po intake.  Found to have worsening bibasilar airspace disease and pleural effusions with hypotension despite 3.5L IVF. PCCM consulted for admission.  Labs were significant for WBC 3.3, Hgb 6.1 down from baseline of 9 several weeks ago, Lactic acid 3.2, Covid-19 negative.   Course complicated by right pneumothorax after right IJ attempt   Assessment & Plan:   Active Problems:   CLL (chronic lymphocytic leukemia) (HCC)   Acute respiratory failure (HCC)   Acute combined systolic and diastolic heart failure (HCC)   Shock circulatory (HCC)   Septic shock (HCC)   Iatrogenic pneumothorax on R    AKI (acute kidney injury) ([place])   Pressure injury of skin   Pneumothorax on right   Pneumonia due to infectious organism   Severe protein-calorie malnutrition (HCC)   Hypokalemia   Hypomagnesemia   Hypophosphatemia   Acute metabolic encephalopathy  1 septic shock secondary to pneumonia and hypovolemia due to poor oral intake, POA Improving.  Patient noted to have bout of hypotension the morning of 09/01/2020, which improved after a dose of IV albumin.  Chest x-ray done concerning for pneumonia.  Patient currently afebrile.  Patient has been off pressors since 08/30/2020 with some bouts of intermittent hypotension.  Continue IV cefepime to treat for total of 7 days.  Supportive care.  PCCM following.  2.  Pneumonia, possible aspiration pneumonia MRSA PCR negative.  Patient noted to have a history of Klebsiella in the sputum.  Patient seen by speech therapy who did not have any concerns for aspiration.  Afebrile.  Some clinical improvement.  Continue  empiric IV cefepime to complete a 7-day course of antibiotic treatment.  3.  Right-sided pneumothorax Status post chest tube placement 08/27/2020.  Is noted per PCCM that tube was nonfunctional and replaced 08/31/2020 with reexpansion of the lung.  Still with air leak.  Patient noted this morning we will repeat chest x-ray to have expanding of pneumothorax.  Patient assessed by critical care this morning and tube noted to be nonfunctional and fixed per PCCM repeat chest x-ray pending.  4.  Severe protein calorie malnutrition Currently on tube feeds to supplement oral intake.  Follow.  5.  Acute kidney injury/metabolic acidosis Acute kidney injury improved.  Patient still with a acidosis with a bicarb of 14.  Place on a bicarb drip.  Follow.   6.  Well-controlled diabetes mellitus type 2 Hemoglobin A1c 5.8 (08/28/2020).  CBGs of 99 this morning.  Patient on tube feeds.  Patient noted to be on Tresiba 12 units daily, NovoLog 3 to 5 units 3 times daily, Actos 15 mg daily.  Patient currently with poor oral intake.  Continue Lantus 5 units daily.  Sliding scale insulin.  Patient on a regular diet.  Follow.  7.  Hypophosphatemia/hypomagnesemia/hypokalemia Patient received potassium, magnesium, phosphorus supplementation yesterday.  Potassium currently at 3.7.  Phosphorus at 1.3.  Magnesium at 1.6.  We will give magnesium sulfate 4 g IV x1.  K-Phos 30 mmol IV x1.  Repeat labs in the morning.  Follow.   8.  Delirium/acute encephalopathy In the setting of critical  illness and altered sleep-wake cycle.  Improving.  Avoid benzos.  PCCM recommendations.  Seroquel to be DC'd per PCCM recommendations.  Follow.  9.  CLL Status post 3 cycles chemotherapy.  Outpatient follow-up with oncology.  10.  Pancytopenia Likely secondary to sepsis/illness.  Patient with no overt bleeding.  Patient with history of CLL.  Oncology aware of admission.  CBC pending.  Follow.  Transfusion threshold hemoglobin <7, platelet count  < 10.    DVT prophylaxis: SCDs Code Status: Partial Family Communication: No family at bedside. Disposition:   Status is: Inpatient    Dispo: The patient is from: Home              Anticipated d/c is to: To be determined              Anticipated d/c date is: To be determined              Patient currently with a pneumothorax, chest tube in place, NG tube in place receiving tube feeds, on IV antibiotics.  Not stable for discharge.       Consultants:   PCCM admission  Procedures:   Chest x-ray 9/12 RIJ attempted, R pigtail chest tube by ED >> 9/16 removed since non functional 9/12 L Fem CVL >> 9/14 , pulled out 9/16 RT pigtail >>  Significant Hospital Events   9/11 Admit to PCCM 9/15 weaned off vasopressors ,increasing delirium, pulled out his central line      Antimicrobials:   IV cefepime 08/27/2020>>>>> 09/03/2020  IV Zosyn x1 dose 08/21/2020  IV vancomycin 09/05/2020>>>> 08/28/2020   Subjective: Was called by RN this morning that radiology had informed that pneumothorax had gotten larger.  Patient in room has been assessed by PCCM and noted to have a continuous air leak in the chest tube secondary to Pleur-evac not screwed in properly to chest tube which was fixed and suction increased to 40.  Patient with complaints of pain around chest tube site.  Patient denies any significant shortness of breath.  No chest pain.   Objective: Vitals:   09/01/20 2152 09/02/20 0600 09/02/20 0608 09/02/20 0817  BP: 138/74  133/83 (!) 155/88  Pulse: 80  92 96  Resp: 18  20   Temp: 98.4 F (36.9 C)  98.3 F (36.8 C) 98.5 F (36.9 C)  TempSrc: Oral  Oral Oral  SpO2: 100%  98% 99%  Weight:  55.3 kg    Height:        Intake/Output Summary (Last 24 hours) at 09/02/2020 1011 Last data filed at 09/02/2020 0600 Gross per 24 hour  Intake 1623.92 ml  Output 1700 ml  Net -76.08 ml   Filed Weights   08/31/20 0404 09/01/20 0145 09/02/20 0600  Weight: 51.3 kg 44.2 kg 55.3 kg     Examination:  General exam: Awake.  Alert.  Mittens on. Respiratory system: CTAB anterior lung fields.  No wheezes, no crackles, no rhonchi.   Cardiovascular system: RRR no murmurs rubs or gallops.  No JVD.  No lower extremity edema.  Right-sided chest tube in place with serous drainage.  Gastrointestinal system: Abdomen is soft, nontender, nondistended, positive bowel sounds.  No rebound.  No guarding.  Central nervous system: Alert and oriented to self and place. No focal neurological deficits.  Moving extremities spontaneously. Extremities: Symmetric 5 x 5 power. Skin: No rashes, lesions or ulcers Psychiatry: Judgement and insight appear normal. Mood & affect appropriate.     Data Reviewed: I have personally reviewed  following labs and imaging studies  CBC: Recent Labs  Lab 08/19/2020 1609 08/27/20 0623 08/28/20 0311 08/29/20 0854 08/30/20 1230 08/31/20 0243 09/01/20 0253  WBC 3.3*   < > 8.2 8.6 3.5* 4.9 2.9*  NEUTROABS 1.1*  --   --   --  2.3  --   --   HGB 6.1*   < > 11.1* 11.1* 10.9* 12.8* 11.6*  HCT 20.0*   < > 33.9* 33.4* 33.3* 39.5 36.5*  MCV 104.2*   < > 89.2 86.3 87.4 88.4 90.3  PLT 126*   < > 140* 117* 58* 66* 33*   < > = values in this interval not displayed.    Basic Metabolic Panel: Recent Labs  Lab 08/27/20 0623 08/27/20 0623 08/28/20 0311 08/28/20 0311 08/29/20 0854 08/29/20 1641 08/30/20 1230 08/31/20 0243 09/01/20 0253 09/02/20 0838  NA 129*   < > 130*   < > 133*  --  135 135 134* 133*  K 4.9   < > 4.7   < > 3.9  --  2.9* 2.6* 3.0* 3.7  CL 102   < > 104   < > 106  --  108 109 107 111  CO2 16*   < > 14*   < > 16*  --  17* 17* 17* 14*  GLUCOSE 79   < > 223*   < > 184*  --  130* 91 243* 164*  BUN 33*   < > 40*   < > 50*  --  40* 35* 33* 26*  CREATININE 1.32*   < > 1.16   < > 1.34*  --  0.99 0.97 0.86 0.67  CALCIUM 8.3*   < > 8.2*   < > 8.3*  --  8.4* 8.6* 8.1* 7.7*  MG 1.5*   < > 2.6*  --  2.5*  --   --  1.9 1.5* 1.6*  PHOS 2.7  --   --   --    --  2.4*  --   --  <1.0* 1.3*   < > = values in this interval not displayed.    GFR: Estimated Creatinine Clearance: 61.4 mL/min (by C-G formula based on SCr of 0.67 mg/dL).  Liver Function Tests: Recent Labs  Lab 08/31/2020 1958  AST 14*  ALT 9  ALKPHOS 56  BILITOT 0.2*  PROT 4.5*  ALBUMIN 1.9*    CBG: Recent Labs  Lab 09/01/20 1551 09/01/20 2150 09/02/20 0040 09/02/20 0529 09/02/20 0808  GLUCAP 244* 103* 99 127* 139*     Recent Results (from the past 240 hour(s))  Culture, Urine     Status: Abnormal   Collection Time: 09/04/2020  3:49 PM   Specimen: Urine, Random  Result Value Ref Range Status   Specimen Description   Final    URINE, RANDOM Performed at [facility] [address], [place], [place] [address]    Special Requests   Final    NONE Performed at [facility], [place] [address], [place], [place] [address]    Culture (A)  Final    <10,000 COLONIES/mL INSIGNIFICANT GROWTH Performed at [facility] [address], [place], [place] [address]    Report Status 08/28/2020 FINAL  Final  SARS Coronavirus 2 by RT PCR (hospital order, performed in [facility] hospital lab) Nasopharyngeal Nasopharyngeal Swab     Status: None   Collection Time: 09/11/2020  4:10 PM   Specimen: Nasopharyngeal Swab  Result Value Ref Range  Status   SARS Coronavirus 2 NEGATIVE NEGATIVE Final    Comment: (NOTE) SARS-CoV-2 target nucleic acids are NOT DETECTED.  The SARS-CoV-2 RNA is generally detectable in upper and lower respiratory specimens during the acute phase of infection. The lowest concentration of SARS-CoV-2 viral copies this assay can detect is 250 copies / mL. A negative result does not preclude SARS-CoV-2 infection and should not be used as the sole basis for treatment or other patient management decisions.  A negative result may occur with improper specimen collection / handling, submission of specimen other than  nasopharyngeal swab, presence of viral mutation(s) within the areas targeted by this assay, and inadequate number of viral copies (<250 copies / mL). A negative result must be combined with clinical observations, patient history, and epidemiological information.  Fact Sheet for Patients:   StrictlyIdeas.no  Fact Sheet for Healthcare Providers: BankingDealers.co.za  This test is not yet approved or  cleared by the [place] FDA and has been authorized for detection and/or diagnosis of SARS-CoV-2 by FDA under an Emergency Use Authorization (EUA).  This EUA will remain in effect (meaning this test can be used) for the duration of the COVID-19 declaration under Section 564(b)(1) of the Act, 21 U.S.C. section 360bbb-3(b)(1), unless the authorization is terminated or revoked sooner.  Performed at [facility], [place] [address], [place], [place] [address]   Expectorated sputum assessment w rflx to resp cult     Status: None   Collection Time: 08/27/20  7:17 AM   Specimen: Sputum  Result Value Ref Range Status   Specimen Description SPUTUM  Final   Special Requests NONE  Final   Sputum evaluation   Final    SPECIMEN MICROSCOPICALLY RESEMBLES SALIVA, NOT CULTURED Sputum specimen not acceptable for testing.  Please recollect.   Performed at [facility], [place] [address], [place], [place] [address]    Report Status 08/28/2020 FINAL  Final  MRSA PCR Screening     Status: None   Collection Time: 08/27/20 11:44 AM   Specimen: Nasopharyngeal  Result Value Ref Range Status   MRSA by PCR NEGATIVE NEGATIVE Final    Comment:        The GeneXpert MRSA Assay (FDA approved for NASAL specimens only), is one component of a comprehensive MRSA colonization surveillance program. It is not intended to diagnose MRSA infection nor to guide or monitor treatment for MRSA infections. Performed at [facility], [place] [address], [place], [place] [address]   Culture, blood (single)     Status: None   Collection Time: 08/27/20 12:12 PM   Specimen: Left Antecubital; Blood  Result Value Ref Range Status   Specimen Description   Final    LEFT ANTECUBITAL Performed at [facility] [address] [place], [place] [address]    Special Requests   Final    BOTTLES DRAWN AEROBIC ONLY Blood Culture results may not be optimal due to an inadequate volume of blood received in culture bottles Performed at [facility] [address], [place], [place] [address]    Culture   Final    NO GROWTH 5 DAYS Performed at [facility], [place] [address] [place] [address] [place], [place] [address]    Report Status 09/01/2020 FINAL  Final         Radiology Studies: DG Abd 1 View  Result Date: 08/31/2020 CLINICAL DATA:  Encounter for feeding tube placement EXAM: ABDOMEN - 1 VIEW COMPARISON:  Abdominal  radiograph of 08/30/2020 FINDINGS: RIGHT-sided chest tube remains in place. Small amount of subcutaneous emphysema. Additional chest tube has been placed since previous chest radiograph. Tip of this chest tube cannot be seen, better seen on subsequent chest radiograph. The RIGHT hemidiaphragm no longer shows depression and lucency that was noted on the previous exam. There is RIGHT and LEFT basilar airspace disease likely with LEFT-sided pleural fluid layering dependently. IMPRESSION: 1. Interval improvement with respect to basilar portion of pneumothorax seen on previous imaging following placement of a second chest tube. 2. Effusion and basilar airspace disease on the LEFT. 3. Feeding tube in the mid stomach. Electronically Signed   By: Zetta Bills M.D.   On: 08/31/2020 11:26   DG Chest Port 1 View  Result Date: 09/02/2020 CLINICAL DATA:  Follow-up pneumothorax. EXAM: PORTABLE CHEST 1 VIEW COMPARISON:  09/01/2020 FINDINGS: Right-sided chest tube is in place. The pigtail  overlies the right upper lobe. Recurrent small to moderate right-sided pneumothorax is identified. Persistent opacification within the right mid and lower lung. Diffuse opacification of the left lung, unchanged. Small pleural effusions, stable. IMPRESSION: 1. Recurrent small to moderate right-sided pneumothorax. 2. No change in aeration to the lungs compared with previous exam. 3. Critical Value/emergent results were called by telephone at the time of interpretation on 09/02/2020 at 6:57 am to provider Nurse Karrie Doffing, who verbally acknowledged these results. Electronically Signed   By: Kerby Moors M.D.   On: 09/02/2020 06:57   DG Chest Port 1 View  Result Date: 09/01/2020 CLINICAL DATA:  Pneumothorax EXAM: PORTABLE CHEST 1 VIEW COMPARISON:  08/31/2020 FINDINGS: Interval removal of 1 of the 2 right chest tubes. No visible pneumothorax. Airspace disease throughout the left lung with possible layering left effusion, unchanged. Heart is borderline in size. IMPRESSION: Interval removal of 1 of the 2 right chest tubes without visible pneumothorax. Stable hazy opacity throughout the left lung, likely layering effusion and airspace disease. Electronically Signed   By: Rolm Baptise M.D.   On: 09/01/2020 04:46   DG CHEST PORT 1 VIEW  Result Date: 08/31/2020 CLINICAL DATA:  Encounter for chest tube placement EXAM: PORTABLE CHEST 1 VIEW COMPARISON:  Earlier today FINDINGS: A new chest tube has been placed with tip at the apex. The right base tube is in stable position. Significant evacuation of right pneumothorax with no definite residual seen. Hazy opacification of the left chest from pleural fluid and pulmonary density. Feeding tube has been placed with indwelling wire and weight over the stomach. Stable heart size. IMPRESSION: 1. Additional right chest tube with evacuated pneumothorax. 2. Continued hazy opacification of the left chest, likely both pleural fluid and pulmonary opacity. Electronically Signed    By: Monte Fantasia M.D.   On: 08/31/2020 11:23        Scheduled Meds: . Chlorhexidine Gluconate Cloth  6 each Topical Daily  . free water  200 mL Per Tube Q4H  . insulin aspart  0-15 Units Subcutaneous Q4H  . insulin glargine  5 Units Subcutaneous Daily  . mouth rinse  15 mL Mouth Rinse BID  . multivitamin with minerals  1 tablet Per Tube Daily  . pantoprazole (PROTONIX) IV  40 mg Intravenous Q24H  . sodium chloride flush  10 mL Intracatheter Q8H  . sodium chloride flush  10-40 mL Intracatheter Q12H   Continuous Infusions: . sodium chloride 10 mL/hr at 09/01/20 1800  . ceFEPime (MAXIPIME) IV 2 g (09/02/20 0045)  . feeding supplement (GLUCERNA 1.5 CAL) 25 mL/hr at  09/02/20 0600     LOS: 6 days    Time spent: 35 minutes    Irine Seal, MD [facility]   To contact the attending provider between 7A-7P or the covering provider during after hours 7P-7A, please log into the web site [email or web address] and access using universal  password for that web site. If you do not have the password, please call the hospital operator.  09/02/2020, 10:11 AM

## 2020-09-02 NOTE — Evaluation (Signed)
Physical Therapy Evaluation Patient Details Name: Tony Marshall MRN: [id number] DOB: September 30, 1941 Today's Date: 09/02/2020   History of Present Illness  Pt is 79 yo male with PMH of HF, CLL s/p 3 cycles of chemo and radiation , DM, HTN, and afib.   Pt has been admitted with PNE, septic shock, pleural effusions, pneumothorax with chest tube placed 9/12 and replaced on 9/16 due to prior tube nonfunctional.  Clinical Impression  Pt admitted with above diagnosis. Pt has had several recent hospital admission - prior to that was independent and lived with wife.  Today, pt was very weak and tolerated minimal activity.  He was able to sit EOB for 2-3 mins but required max A to get there and was not able to attempt standing.  Did note pt with 0/5 L dorsiflexion strength (otherwise generalized weakness) - he reports this is new and PT reviewed prior therapy notes and foot drop not mentioned.  Pt with no other neurological signs - notified RN and recommend Prevalon boot. Wife was not present at this time to discuss d/c plans, however, prior visits she has wanted to take the pt home.  At this time pt requiring max A - recommend SNF.   Pt currently with functional limitations due to the deficits listed below (see PT Problem List). Pt will benefit from skilled PT to increase their independence and safety with mobility to allow discharge to the venue listed below.       Follow Up Recommendations SNF    Equipment Recommendations  Wheelchair cushion (measurements PT);Wheelchair (measurements PT)    Recommendations for Other Services       Precautions / Restrictions Precautions Precautions: Fall Precaution Comments: cortrak, rectal pouch, chest tube Restrictions Weight Bearing Restrictions: No      Mobility  Bed Mobility Overal bed mobility: Needs Assistance Bed Mobility: Supine to Sit;Sit to Supine     Supine to sit: Max assist Sit to supine: Max assist   General bed mobility comments:  required assist for trunks and legs into and out of bed  Transfers                 General transfer comment: unable due to fatigue and wanting to lie down  Ambulation/Gait                Stairs            Wheelchair Mobility    Modified Rankin (Stroke Patients Only)       Balance Overall balance assessment: Needs assistance Sitting-balance support: No upper extremity supported;Feet supported Sitting balance-Leahy Scale: Fair Sitting balance - Comments: Sat EOB for 2-3 mins with close supervision; unable to tolerate challenges or sitting longer       Standing balance comment: unable to attempt                             Pertinent Vitals/Pain Pain Assessment: No/denies pain    Home Living Family/patient expects to be discharged to:: Skilled nursing facility Living Arrangements: Spouse/significant other Available Help at Discharge: Family Type of Home: House Home Access: Level entry     Home Layout: One level Home Equipment: Environmental consultant - 2 wheels;Bedside commode;Toilet riser;Walker - 4 wheels Additional Comments: sunk in living room (3 steps to kitchen), pt does not have to go down or up steps to get to living room where his bed is.    Prior Function Level of Independence: Needs assistance  Comments: Pt is questionable historian.  He has had several recent hospital visits requiring min-mod A for transfers and minimal ambulation.  Reports he was walking in house with walker and able to do ADLs.  Per prior notes pt was independent prior to recent hospital admissions     Hand Dominance        Extremity/Trunk Assessment   Upper Extremity Assessment Upper Extremity Assessment: Generalized weakness (Demonstrating at least 3/5 throughout but not further able to participate)    Lower Extremity Assessment Lower Extremity Assessment: RLE deficits/detail;LLE deficits/detail RLE Deficits / Details: Demonstrating at least 3/5 MMT and  WFL ROM but unable to further participate in strength testing RLE Sensation: WNL LLE Deficits / Details: Demonstrating 3/5 MMT in hip and knee, ankle DF with0/5 strength (pt reports this is new); ROM WFL LLE Sensation: WNL    Cervical / Trunk Assessment Cervical / Trunk Assessment: Kyphotic  Communication   Communication: HOH  Cognition Arousal/Alertness: Awake/alert Behavior During Therapy: Flat affect Overall Cognitive Status: No family/caregiver present to determine baseline cognitive functioning                                 General Comments: Pt oriented to self and place only, he was able to answer PLOF questions but vaguely and question accuracy      General Comments General comments (skin integrity, edema, etc.): VSS    Exercises     Assessment/Plan    PT Assessment Patient needs continued PT services  PT Problem List Decreased strength;Decreased mobility;Decreased safety awareness;Decreased coordination;Decreased knowledge of precautions;Decreased activity tolerance;Decreased cognition;Cardiopulmonary status limiting activity;Decreased balance;Decreased knowledge of use of DME       PT Treatment Interventions DME instruction;Therapeutic activities;Gait training;Therapeutic exercise;Patient/family education;Balance training;Functional mobility training    PT Goals (Current goals can be found in the Care Plan section)  Acute Rehab PT Goals Patient Stated Goal: unable PT Goal Formulation: Patient unable to participate in goal setting Time For Goal Achievement: 09/16/20 Potential to Achieve Goals: Fair    Frequency Min 3X/week   Barriers to discharge Decreased caregiver support requiring max A at this time - caregiver likely unable to provide max A    Co-evaluation               AM-PAC PT "6 Clicks" Mobility  Outcome Measure Help needed turning from your back to your side while in a flat bed without using bedrails?: Total Help needed moving  from lying on your back to sitting on the side of a flat bed without using bedrails?: Total Help needed moving to and from a bed to a chair (including a wheelchair)?: Total Help needed standing up from a chair using your arms (e.g., wheelchair or bedside chair)?: Total Help needed to walk in hospital room?: Total Help needed climbing 3-5 steps with a railing? : Total 6 Click Score: 6    End of Session   Activity Tolerance: Patient limited by lethargy;Patient limited by fatigue Patient left: in bed;with call bell/phone within reach;with bed alarm set Nurse Communication: Mobility status;Other (comment) (Dorsiflexion 0/5 and would benefit from Prevalon boot on L LE) PT Visit Diagnosis: Other abnormalities of gait and mobility (R26.89);Muscle weakness (generalized) (M62.81)    Time: 5009-3818 PT Time Calculation (min) (ACUTE ONLY): 20 min   Charges:   PT Evaluation $PT Eval Moderate Complexity: 1 Mod          Ajmal Kathan, PT Acute Clinical cytogeneticist  [phone] [name] [phone]    Tony Marshall 09/02/2020, 11:19 AM

## 2020-09-03 ENCOUNTER — Inpatient Hospital Stay (HOSPITAL_COMMUNITY): Payer: Medicare HMO

## 2020-09-03 LAB — GLUCOSE, CAPILLARY
Glucose-Capillary: 132 mg/dL — ABNORMAL HIGH (ref 70–99)
Glucose-Capillary: 143 mg/dL — ABNORMAL HIGH (ref 70–99)
Glucose-Capillary: 147 mg/dL — ABNORMAL HIGH (ref 70–99)
Glucose-Capillary: 149 mg/dL — ABNORMAL HIGH (ref 70–99)
Glucose-Capillary: 171 mg/dL — ABNORMAL HIGH (ref 70–99)
Glucose-Capillary: 86 mg/dL (ref 70–99)

## 2020-09-03 LAB — CBC
HCT: 29.8 % — ABNORMAL LOW (ref 39.0–52.0)
Hemoglobin: 10.5 g/dL — ABNORMAL LOW (ref 13.0–17.0)
MCH: 28.9 pg (ref 26.0–34.0)
MCHC: 35.2 g/dL (ref 30.0–36.0)
MCV: 82.1 fL (ref 80.0–100.0)
Platelets: 38 10*3/uL — ABNORMAL LOW (ref 150–400)
RBC: 3.63 MIL/uL — ABNORMAL LOW (ref 4.22–5.81)
RDW: 22.5 % — ABNORMAL HIGH (ref 11.5–15.5)
WBC: 2.5 10*3/uL — ABNORMAL LOW (ref 4.0–10.5)
nRBC: 0 % (ref 0.0–0.2)

## 2020-09-03 LAB — BASIC METABOLIC PANEL
Anion gap: 9 (ref 5–15)
BUN: 29 mg/dL — ABNORMAL HIGH (ref 8–23)
CO2: 20 mmol/L — ABNORMAL LOW (ref 22–32)
Calcium: 7.3 mg/dL — ABNORMAL LOW (ref 8.9–10.3)
Chloride: 102 mmol/L (ref 98–111)
Creatinine, Ser: 0.69 mg/dL (ref 0.61–1.24)
GFR calc Af Amer: >60 mL/min (ref >60)
GFR calc non Af Amer: >60 mL/min (ref >60)
Glucose, Bld: 152 mg/dL — ABNORMAL HIGH (ref 70–99)
Potassium: 4.8 mmol/L (ref 3.5–5.1)
Sodium: 131 mmol/L — ABNORMAL LOW (ref 135–145)

## 2020-09-03 LAB — MAGNESIUM: Magnesium: 2.2 mg/dL (ref 1.7–2.4)

## 2020-09-03 LAB — PHOSPHORUS: Phosphorus: 2 mg/dL — ABNORMAL LOW (ref 2.5–4.6)

## 2020-09-03 MED ORDER — ALBUMIN HUMAN 25 % IV SOLN
25.0000 g | Freq: Four times a day (QID) | INTRAVENOUS | Status: AC
  Administered 2020-09-03: 25 g via INTRAVENOUS
  Administered 2020-09-03: 12.5 g via INTRAVENOUS
  Administered 2020-09-03 – 2020-09-04 (×3): 25 g via INTRAVENOUS
  Filled 2020-09-03 (×4): qty 100

## 2020-09-03 MED ORDER — FUROSEMIDE 10 MG/ML IJ SOLN
20.0000 mg | Freq: Once | INTRAMUSCULAR | Status: AC
  Administered 2020-09-03: 20 mg via INTRAVENOUS
  Filled 2020-09-03: qty 2

## 2020-09-03 MED ORDER — FREE WATER
200.0000 mL | Freq: Four times a day (QID) | Status: DC
  Administered 2020-09-03 – 2020-09-05 (×7): 200 mL

## 2020-09-03 MED ORDER — SODIUM PHOSPHATES 45 MMOLE/15ML IV SOLN
30.0000 mmol | Freq: Once | INTRAVENOUS | Status: AC
  Administered 2020-09-03: 30 mmol via INTRAVENOUS
  Filled 2020-09-03: qty 10

## 2020-09-03 NOTE — Evaluation (Signed)
Occupational Therapy Evaluation Patient Details Name: Tony Marshall MRN: [id number] DOB: 1941/01/31 Today's Date: 09/03/2020    History of Present Illness Pt is 79 yo male with PMH of HF, CLL s/p 3 cycles of chemo and radiation , DM, HTN, and afib.   Pt has been admitted with PNE, septic shock, pleural effusions, pneumothorax with chest tube placed 9/12 and replaced on 9/16 due to prior tube nonfunctional.   Clinical Impression   Pt admitted with the above. Pt currently with functional limitations due to the deficits listed below (see OT Problem List).  Pt will benefit from skilled OT to increase their safety and independence with ADL and functional mobility for ADL to facilitate discharge to venue listed below.   Pt needs increased A with ADL's and mobility compared to last admission.  Unsure family will be able to care for pt at home.  Daughter and wife are available but pt needing much more A     Follow Up Recommendations  SNF    Equipment Recommendations  None recommended by OT    Recommendations for Other Services       Precautions / Restrictions Precautions Precautions: Fall Precaution Comments: cortrak, rectal pouch, chest tube      Mobility Bed Mobility               General bed mobility comments: declined despite encouragement  Transfers           NT                ADL either performed or assessed with clinical judgement   ADL Overall ADL's : Needs assistance/impaired     Grooming: Wash/dry face;Minimal assistance;Bed level   Upper Body Bathing: Minimal assistance;Bed level   Lower Body Bathing: Bed level;+2 for physical assistance;Total assistance   Upper Body Dressing : Minimal assistance;Bed level   Lower Body Dressing: Total assistance;Bed level   Toilet Transfer: Total assistance             General ADL Comments: limited eval despite encouragement. RN and daugther provided encouragement. Pt did agree to BUE AROM and  positioning.  Pt with very flat affect. Daugther left room when pt showed discomfort with RUE ROM     Vision Patient Visual Report: No change from baseline              Pertinent Vitals/Pain Pain Assessment: Faces Pain Score: 3  Pain Location: grimaced with RUE ROM Pain Descriptors / Indicators: Grimacing;Sore Pain Intervention(s): Limited activity within patient's tolerance;Repositioned     Hand Dominance Right   Extremity/Trunk Assessment Upper Extremity Assessment Upper Extremity Assessment: Generalized weakness (protective of RUE ROM due to soreness with chest tube. Encouraged AROM with BUE)           Communication Communication Communication: HOH   Cognition Arousal/Alertness: Awake/alert Behavior During Therapy: Flat affect Overall Cognitive Status: Within Functional Limits for tasks assessed                                                Home Living Family/patient expects to be discharged to:: Skilled nursing facility Living Arrangements: Spouse/significant other Available Help at Discharge: Family Type of Home: House Home Access: Level entry     Home Layout: One level     Bathroom Shower/Tub: Occupational psychologist: Standard Bathroom Accessibility: Yes  Home Equipment: [place] - 2 wheels;Bedside commode;Toilet riser;Walker - 4 wheels   Additional Comments: sunk in living room (3 steps to kitchen), pt does not have to go down or up steps to get to living room where his bed is.      Prior Functioning/Environment Level of Independence: Needs assistance                 OT Problem List: Decreased strength;Decreased activity tolerance;Impaired balance (sitting and/or standing);Decreased safety awareness;Decreased knowledge of use of DME or AE      OT Treatment/Interventions: Self-care/ADL training;Patient/family education;DME and/or AE instruction;Therapeutic activities    OT Goals(Current goals can be found in the  care plan section) Acute Rehab OT Goals Patient Stated Goal: did not state OT Goal Formulation: With patient Time For Goal Achievement: 09/17/20  OT Frequency: Min 2X/week   Barriers to D/C:               AM-PAC OT "6 Clicks" Daily Activity     Outcome Measure Help from another person eating meals?: A Little Help from another person taking care of personal grooming?: A Little Help from another person toileting, which includes using toliet, bedpan, or urinal?: A Lot Help from another person bathing (including washing, rinsing, drying)?: Total Help from another person to put on and taking off regular upper body clothing?: A Lot Help from another person to put on and taking off regular lower body clothing?: Total 6 Click Score: 12   End of Session Nurse Communication: Mobility status  Activity Tolerance: Patient limited by fatigue Patient left:    OT Visit Diagnosis: Unsteadiness on feet (R26.81);Other abnormalities of gait and mobility (R26.89);History of falling (Z91.81);Muscle weakness (generalized) (M62.81)                Time: 4854-6270 OT Time Calculation (min): 22 min Charges:  OT General Charges $OT Visit: 1 Visit OT Evaluation $OT Eval Moderate Complexity: 1 Mod  Kari Baars, [place] [phone] Office- [phone]     Izard, Edwena Felty D 09/03/2020, 2:00 PM

## 2020-09-03 NOTE — Progress Notes (Signed)
NAME:  Tony Marshall, MRN:  [id number], DOB:  Jun 28, 1941, LOS: 7 ADMISSION DATE:  08/27/2020, CONSULTATION DATE:  09/03/20 REFERRING MD:  EDP, CHIEF COMPLAINT:  lethargy  Brief History   65 yowm never smoker with history of HFrEF and CLL s/p three cycles chemo and rx avelox since 8/27    for PNA who presented with lethargy and poor po intake.  Found to have worsening bibasilar airspace disease and pleural effusions with hypotension despite 3.5L IVF. PCCM consulted for admission.  Labs were significant for WBC 3.3, Hgb 6.1 down from baseline of 9 several weeks ago, Lactic acid 3.2, Covid-19 negative.   Course complicated by right pneumothorax after right IJ attempt  Past Medical History  Atrial fibrillation not on anticoagulation  HFrEF CLL s/p 3 cycles chemo DM HTN GERD  OSA  Significant Hospital Events   9/11 Admit to PCCM 9/15 weaned off vasopressors ,increasing delirium, pulled out his central line  9/17 transfer to [facility], PCCM following for chest tube  Consults:  None  Procedures:  9/12 RIJ attempted, R pigtail chest tube by ED >> 9/16 removed since non functional 9/12 L Fem CVL >> 9/14 , pulled out 9/16 RT pigtail >>  Significant Diagnostic Tests:     Micro Data:  Sars-Cov-2 PCR 9/11 >negative Sputum culture  9/12 > Blood Cx  9/12>ng Urine Cx  9/12> insig growth  Antimicrobials:  Zozyn 9/11 only Vancomycin 9/11> Cefepime 9/12>  Interim history/subjective:   Respiratory status is stable with no complaints.  No issues overnight   Objective   Blood pressure 127/73, pulse 72, temperature 97.7 F (36.5 C), temperature source Oral, resp. rate 18, height 5' 7.5" (1.715 m), weight 58 kg, SpO2 98 %.        Intake/Output Summary (Last 24 hours) at 09/03/2020 0950 Last data filed at 09/03/2020 0600 Gross per 24 hour  Intake 1093.17 ml  Output 1400 ml  Net -306.83 ml   Exam: Gen:      No acute distress HEENT:  EOMI, sclera anicteric Neck:     No  masses; no thyromegaly Lungs:    Clear to auscultation bilaterally; normal respiratory effort CV:         Regular rate and rhythm; no murmurs Abd:      + bowel sounds; soft, non-tender; no palpable masses, no distension Ext:    No edema; adequate peripheral perfusion Skin:      Warm and dry; no rash Neuro: alert and oriented x 3 Psych: normal mood and affect  Chest x-ray 9/19 with mild-moderate recurrent medial right pneumothorax  Resolved problems AKI, metabolic acidosis without anion gap, very mild lactic acidosis:  Septic shock Pneumonia, possible aspiration pneumonia  Assessment & Plan:  79 year old man with history of HFrEF and CLL s/p three cycles chemo with chronic dysphagia presenting with septic shock, aspiration pneumonia, right pneumothorax after IJ attempt.  R  Pneumothorax: s/p chest tube placement on 9/12 Chest x-ray reviewed with recurrent pneumothorax.  He has good air leak from the chest tube The medial position of the pneumothorax will make it difficult to access with another chest tube Suction kept at -40 Follow chest x-ray.  If unresolved then may need cardiothoracic surgery to weigh in tomorrow but overall he does not seem to be a great candidate for surgery.  PCCM will continue to follow chest tube, defer to [facility] for other management  Best practice:  Per primary team  Marshell Garfinkel MD [facility] Please  see [email or web address] for pager details.  09/03/2020, 9:50 AM

## 2020-09-03 NOTE — Progress Notes (Addendum)
PROGRESS NOTE    Tony Marshall  PPI:[id number] DOB: [birth year] DOA: 08/25/2020 PCP: Timoteo Gaul, FNP    Chief Complaint  Patient presents with  . Hypotension  . Weakness    Brief Narrative:  19 yowm never smoker with history of HFrEF and CLL s/p three cycles chemo and rx avelox since 8/27    for PNA who presented with lethargy and poor po intake.  Found to have worsening bibasilar airspace disease and pleural effusions with hypotension despite 3.5L IVF. PCCM consulted for admission.  Labs were significant for WBC 3.3, Hgb 6.1 down from baseline of 9 several weeks ago, Lactic acid 3.2, Covid-19 negative.   Course complicated by right pneumothorax after right IJ attempt   Assessment & Plan:   Active Problems:   CLL (chronic lymphocytic leukemia) (HCC)   Acute respiratory failure (HCC)   Acute combined systolic and diastolic heart failure (HCC)   Shock circulatory (HCC)   Septic shock (HCC)   Iatrogenic pneumothorax on R    AKI (acute kidney injury) ([place])   Pressure injury of skin   Pneumothorax on right   Pneumonia due to infectious organism   Severe protein-calorie malnutrition (HCC)   Hypokalemia   Hypomagnesemia   Hypophosphatemia   Acute metabolic encephalopathy  1 septic shock secondary to pneumonia and hypovolemia due to poor oral intake, POA Improving.  Patient noted to have bout of hypotension the morning of 09/01/2020, which improved after a dose of IV albumin.  Chest x-ray done concerning for pneumonia.  Patient currently afebrile.  Patient has been off pressors since 08/30/2020 with some bouts of intermittent hypotension.  Status post IV cefepime x7 days.  Supportive care.  PCCM following.   2.  Pneumonia, possible aspiration pneumonia MRSA PCR negative.  Patient noted to have a history of Klebsiella in the sputum.  Patient seen by speech therapy who did not have any concerns for aspiration.  Afebrile.  Some clinical improvement.  Status post 7 days IV  cefepime.  No further antibiotics needed at this time.  Follow.   3.  Right-sided pneumothorax Status post chest tube placement 08/27/2020.  Is noted per PCCM that tube was nonfunctional and replaced 08/31/2020 with reexpansion of the lung.  Still with air leak.  Patient noted the morning of 09/02/2020 with expanding pneumothorax per chest x-ray.  Patient assessed by critical care and tube noted to be nonfunctional and fixed per PCCM.  Per PCCM follow chest x-ray and if no improvement may need CT surgery to weigh in.  Per PCCM.   4.  Severe protein calorie malnutrition Currently on tube feeds to supplement oral intake.  Dietitian following.  IV albumin every 6 hours x1 day.  Consult with palliative care.  Follow.  5.  Acute kidney injury/metabolic acidosis Acute kidney injury improved.  Patient with acidosis improving on bicarb drip.  Decrease rate of bicarb drip.  Follow.   6.  Well-controlled diabetes mellitus type 2 Hemoglobin A1c 5.8 (08/28/2020).  CBGs of 171 this morning.  Patient on tube feeds.  Patient noted to be on Tresiba 12 units daily, NovoLog 3 to 5 units 3 times daily, Actos 15 mg daily.  Patient currently with poor oral intake.  Lantus 5 units daily.  Sliding scale insulin.  Patient on a regular diet however poor oral intake per RN.  Follow.    7.  Hypophosphatemia/hypomagnesemia/hypokalemia Patient received potassium, magnesium, phosphorus supplementation.  Potassium currently at 4.8 .  Phosphorus at 2.  Magnesium at 2.2.  We will give sodium phosphate 30 mmol IV x1.  Repeat labs in the morning.  Follow.   8.  Delirium/acute encephalopathy In the setting of critical illness and altered sleep-wake cycle.  Improving.  Avoid benzos.  PCCM recommendations.  Seroquel DC'd per PCCM recommendations.  Follow.  9.  CLL Status post 3 cycles chemotherapy.  Outpatient follow-up with oncology.  10.  Pancytopenia Likely secondary to sepsis/illness.  Patient with no overt bleeding.  Patient  with history of CLL.  Oncology aware of admission.  CBC with white count of 2.5, hemoglobin of 10.5, platelet count of 38.  Patient with no bleeding. Transfusion threshold hemoglobin <7, platelet count < 10.  11.  Hyponatremia Patient with a mild hyponatremia.  Likely secondary to poor oral intake as well as free water flushes..  Patient on tube feeds.  Change free water flushes to every 6 hours.  12.  Pressure ulcer sacrum medial stage II, POA Continue current wound care. Pressure Injury 08/27/20 Sacrum Medial Stage 2 -  Partial thickness loss of dermis presenting as a shallow open injury with a red, pink wound bed without slough. Red area with small area of broken skin in center (Active)  08/27/20 1024  Location: Sacrum  Location Orientation: Medial  Staging: Stage 2 -  Partial thickness loss of dermis presenting as a shallow open injury with a red, pink wound bed without slough.  Wound Description (Comments): Red area with small area of broken skin in center  Present on Admission: Yes     Pressure Injury 08/27/20 Nose Left Deep Tissue Pressure Injury - Purple or maroon localized area of discolored intact skin or blood-filled blister due to damage of underlying soft tissue from pressure and/or shear. (Active)  08/27/20 1024  Location: Nose  Location Orientation: Left  Staging: Deep Tissue Pressure Injury - Purple or maroon localized area of discolored intact skin or blood-filled blister due to damage of underlying soft tissue from pressure and/or shear.  Wound Description (Comments):   Present on Admission: Yes         DVT prophylaxis: SCDs Code Status: Partial Family Communication: No family at bedside. Disposition:   Status is: Inpatient    Dispo: The patient is from: Home              Anticipated d/c is to: To be determined              Anticipated d/c date is: To be determined              Patient currently with a pneumothorax, chest tube in place, NG tube in place  receiving tube feeds, on IV antibiotics.  Poor oral intake.  Not stable for discharge.       Consultants:   PCCM admission  Procedures:   Chest x-ray 9/12 RIJ attempted, R pigtail chest tube by ED >> 9/16 removed since non functional 9/12 L Fem CVL >> 9/14 , pulled out 9/16 RT pigtail >>  Significant Hospital Events   9/11 Admit to PCCM 9/15 weaned off vasopressors ,increasing delirium, pulled out his central line      Antimicrobials:   IV cefepime 08/27/2020>>>>> 09/02/2020  IV Zosyn x1 dose 08/23/2020  IV vancomycin 08/27/2020>>>> 08/28/2020   Subjective: Patient laying in bed.  Denies any chest pain.  No significant shortness of breath.  Some discomfort around chest tube site.  Poor oral intake per RN.   Objective: Vitals:   09/02/20 0817 09/02/20 1250 09/02/20 2127 09/03/20 0500  BP: Marland Kitchen)  155/88 127/84 127/73   Pulse: 96 76 72   Resp:  16 18   Temp: 98.5 F (36.9 C) 98 F (36.7 C) 97.7 F (36.5 C)   TempSrc: Oral Oral Oral   SpO2: 99% 96% 98%   Weight:    58 kg  Height:        Intake/Output Summary (Last 24 hours) at 09/03/2020 1103 Last data filed at 09/03/2020 0600 Gross per 24 hour  Intake 1093.17 ml  Output 1400 ml  Net -306.83 ml   Filed Weights   09/01/20 0145 09/02/20 0600 09/03/20 0500  Weight: 44.2 kg 55.3 kg 58 kg    Examination:  General exam: Awake.  Respiratory system: Some coarse rhonchorous breath sounds.  No wheezing.  No crackles.  Cardiovascular system: RRR no murmurs rubs or gallops.  No JVD.  No lower extremity edema.  Right-sided chest tube in place with serous drainage.  Gastrointestinal system: Abdomen is soft, nontender, nondistended, positive bowel sounds.  No rebound.  No guarding.  Central nervous system: Alert and oriented to self and place.  Moving extremities spontaneously.  No focal neurological deficit.  Extremities: Symmetric 5 x 5 power. Skin: No rashes, lesions or ulcers Psychiatry: Judgement and insight appear  normal. Mood & affect appropriate.     Data Reviewed: I have personally reviewed following labs and imaging studies  CBC: Recent Labs  Lab 08/29/20 0854 08/30/20 1230 08/31/20 0243 09/01/20 0253 09/03/20 0658  WBC 8.6 3.5* 4.9 2.9* 2.5*  NEUTROABS  --  2.3  --   --   --   HGB 11.1* 10.9* 12.8* 11.6* 10.5*  HCT 33.4* 33.3* 39.5 36.5* 29.8*  MCV 86.3 87.4 88.4 90.3 82.1  PLT 117* 58* 66* 33* 38*    Basic Metabolic Panel: Recent Labs  Lab 08/29/20 0854 08/29/20 0854 08/29/20 1641 08/30/20 1230 08/31/20 0243 09/01/20 0253 09/02/20 0838 09/03/20 0539  NA 133*   < >  --  135 135 134* 133* 131*  K 3.9   < >  --  2.9* 2.6* 3.0* 3.7 4.8  CL 106   < >  --  108 109 107 111 102  CO2 16*   < >  --  17* 17* 17* 14* 20*  GLUCOSE 184*   < >  --  130* 91 243* 164* 152*  BUN 50*   < >  --  40* 35* 33* 26* 29*  CREATININE 1.34*   < >  --  0.99 0.97 0.86 0.67 0.69  CALCIUM 8.3*   < >  --  8.4* 8.6* 8.1* 7.7* 7.3*  MG 2.5*  --   --   --  1.9 1.5* 1.6* 2.2  PHOS  --   --  2.4*  --   --  <1.0* 1.3* 2.0*   < > = values in this interval not displayed.    GFR: Estimated Creatinine Clearance: 64.4 mL/min (by C-G formula based on SCr of 0.69 mg/dL).  Liver Function Tests: No results for input(s): AST, ALT, ALKPHOS, BILITOT, PROT, ALBUMIN in the last 168 hours.  CBG: Recent Labs  Lab 09/02/20 1733 09/02/20 2125 09/03/20 0018 09/03/20 0454 09/03/20 0740  GLUCAP 138* 86 86 132* 171*     Recent Results (from the past 240 hour(s))  Culture, Urine     Status: Abnormal   Collection Time: 08/25/2020  3:49 PM   Specimen: Urine, Random  Result Value Ref Range Status   Specimen Description   Final    URINE,  RANDOM Performed at [facility], [place] [address] [place], [place] [address]    Special Requests   Final    NONE Performed at [facility], [place] [address], [place], [place] [address]    Culture (A)  Final    <10,000 COLONIES/mL  INSIGNIFICANT GROWTH Performed at [place] [address] [place], [place] [address]    Report Status 08/28/2020 FINAL  Final  SARS Coronavirus 2 by RT PCR (hospital order, performed in [facility] hospital lab) Nasopharyngeal Nasopharyngeal Swab     Status: None   Collection Time: 08/25/2020  4:10 PM   Specimen: Nasopharyngeal Swab  Result Value Ref Range Status   SARS Coronavirus 2 NEGATIVE NEGATIVE Final    Comment: (NOTE) SARS-CoV-2 target nucleic acids are NOT DETECTED.  The SARS-CoV-2 RNA is generally detectable in upper and lower respiratory specimens during the acute phase of infection. The lowest concentration of SARS-CoV-2 viral copies this assay can detect is 250 copies / mL. A negative result does not preclude SARS-CoV-2 infection and should not be used as the sole basis for treatment or other patient management decisions.  A negative result may occur with improper specimen collection / handling, submission of specimen other than nasopharyngeal swab, presence of viral mutation(s) within the areas targeted by this assay, and inadequate number of viral copies (<250 copies / mL). A negative result must be combined with clinical observations, patient history, and epidemiological information.  Fact Sheet for Patients:   StrictlyIdeas.no  Fact Sheet for Healthcare Providers: BankingDealers.co.za  This test is not yet approved or  cleared by the [place] FDA and has been authorized for detection and/or diagnosis of SARS-CoV-2 by FDA under an Emergency Use Authorization (EUA).  This EUA will remain in effect (meaning this test can be used) for the duration of the COVID-19 declaration under Section 564(b)(1) of the Act, 21 U.S.C. section 360bbb-3(b)(1), unless the authorization is terminated or revoked sooner.  Performed at [facility], [place] [address], [place], [place] [address]   Expectorated  sputum assessment w rflx to resp cult     Status: None   Collection Time: 08/27/20  7:17 AM   Specimen: Sputum  Result Value Ref Range Status   Specimen Description SPUTUM  Final   Special Requests NONE  Final   Sputum evaluation   Final    SPECIMEN MICROSCOPICALLY RESEMBLES SALIVA, NOT CULTURED Sputum specimen not acceptable for testing.  Please recollect.   Performed at [facility], [place] [address] [place], [place] [address]    Report Status 08/28/2020 FINAL  Final  MRSA PCR Screening     Status: None   Collection Time: 08/27/20 11:44 AM   Specimen: Nasopharyngeal  Result Value Ref Range Status   MRSA by PCR NEGATIVE NEGATIVE Final    Comment:        The GeneXpert MRSA Assay (FDA approved for NASAL specimens only), is one component of a comprehensive MRSA colonization surveillance program. It is not intended to diagnose MRSA infection nor to guide or monitor treatment for MRSA infections. Performed at [facility], [place] [address], [place], [place] [address]   Culture, blood (single)     Status: None   Collection Time: 08/27/20 12:12 PM   Specimen: Left Antecubital; Blood  Result Value Ref Range Status   Specimen Description   Final    LEFT ANTECUBITAL Performed at [place] [address] [place], [place]  [address]    Special Requests   Final    BOTTLES DRAWN AEROBIC ONLY Blood Culture results may not be optimal due to an inadequate volume of blood received in culture bottles Performed at [facility], [place] [address] [place], [place] [address]    Culture   Final    NO GROWTH 5 DAYS Performed at [facility], [place] [address] [place], [place] [address]    Report Status 09/01/2020 FINAL  Final         Radiology Studies: DG CHEST PORT 1 VIEW  Result Date: 09/03/2020 CLINICAL DATA:  Follow-up pneumothorax. EXAM: PORTABLE CHEST 1 VIEW COMPARISON:  09/02/2020 FINDINGS: Stable  position of right-sided chest tube. Right-sided pneumothorax is again identified and appears similar in volume to the previous exam predominantly overlying the medial right upper lobe and right apex. Unchanged appearance of bilateral pleural effusions. Bilateral interstitial and airspace opacities are also unchanged. IMPRESSION: 1. No change in right-sided pneumothorax. 2. No change in bilateral interstitial and airspace opacities. 3. Stable bilateral pleural effusions. Electronically Signed   By: Kerby Moors M.D.   On: 09/03/2020 08:17   DG CHEST PORT 1 VIEW  Result Date: 09/02/2020 CLINICAL DATA:  Follow-up pneumothorax right side. EXAM: PORTABLE CHEST 1 VIEW COMPARISON:  09/02/2020 FINDINGS: Enteric tube unchanged coursing into the region of the stomach and off the film as tip is not visualized. Right-sided pigtail pleural drainage catheter unchanged. Previously seen right-sided pneumothorax is not visualized on the current exam. Mild interval improvement hazy opacification over the left perihilar region/left base. Persistent hazy density over the medial right base unchanged. Cardiomediastinal silhouette and remainder of the exam is unchanged. IMPRESSION: 1. Interval improvement in hazy opacification over the left perihilar region/left base. Stable hazy density medial right base. 2. Right-sided pigtail pleural drainage catheter unchanged. Previously seen right-sided pneumothorax no longer visualized. Electronically Signed   By: Marin Olp M.D.   On: 09/02/2020 11:31   DG Chest Port 1 View  Result Date: 09/02/2020 CLINICAL DATA:  Follow-up pneumothorax. EXAM: PORTABLE CHEST 1 VIEW COMPARISON:  09/01/2020 FINDINGS: Right-sided chest tube is in place. The pigtail overlies the right upper lobe. Recurrent small to moderate right-sided pneumothorax is identified. Persistent opacification within the right mid and lower lung. Diffuse opacification of the left lung, unchanged. Small pleural effusions,  stable. IMPRESSION: 1. Recurrent small to moderate right-sided pneumothorax. 2. No change in aeration to the lungs compared with previous exam. 3. Critical Value/emergent results were called by telephone at the time of interpretation on 09/02/2020 at 6:57 am to provider Nurse Karrie Doffing, who verbally acknowledged these results. Electronically Signed   By: Kerby Moors M.D.   On: 09/02/2020 06:57        Scheduled Meds: . Chlorhexidine Gluconate Cloth  6 each Topical Daily  . free water  200 mL Per Tube Q6H  . insulin aspart  0-15 Units Subcutaneous Q4H  . insulin glargine  5 Units Subcutaneous Daily  . mouth rinse  15 mL Mouth Rinse BID  . multivitamin with minerals  1 tablet Per Tube Daily  . pantoprazole (PROTONIX) IV  40 mg Intravenous Q24H  . sodium chloride flush  10 mL Intracatheter Q8H  . sodium chloride flush  10-40 mL Intracatheter Q12H   Continuous Infusions: . sodium chloride 10 mL/hr at 09/01/20 1800  . albumin human    . feeding supplement (GLUCERNA 1.5 CAL) 45 mL/hr at 09/02/20 2000  .  sodium bicarbonate (isotonic) infusion in sterile  water 75 mL/hr at 09/03/20 0953  . sodium phosphate  Dextrose 5% IVPB 30 mmol (09/03/20 0955)     LOS: 7 days    Time spent: 35 minutes    Irine Seal, MD [facility]   To contact the attending provider between 7A-7P or the covering provider during after hours 7P-7A, please log into the web site [email or web address] and access using universal [place] password for that web site. If you do not have the password, please call the hospital operator.  09/03/2020, 11:03 AM

## 2020-09-04 ENCOUNTER — Inpatient Hospital Stay (HOSPITAL_COMMUNITY): Payer: Medicare HMO

## 2020-09-04 DIAGNOSIS — Z7189 Other specified counseling: Secondary | ICD-10-CM

## 2020-09-04 DIAGNOSIS — E43 Unspecified severe protein-calorie malnutrition: Secondary | ICD-10-CM

## 2020-09-04 DIAGNOSIS — Z515 Encounter for palliative care: Secondary | ICD-10-CM

## 2020-09-04 LAB — GLUCOSE, CAPILLARY
Glucose-Capillary: 120 mg/dL — ABNORMAL HIGH (ref 70–99)
Glucose-Capillary: 120 mg/dL — ABNORMAL HIGH (ref 70–99)
Glucose-Capillary: 139 mg/dL — ABNORMAL HIGH (ref 70–99)
Glucose-Capillary: 149 mg/dL — ABNORMAL HIGH (ref 70–99)
Glucose-Capillary: 184 mg/dL — ABNORMAL HIGH (ref 70–99)
Glucose-Capillary: 241 mg/dL — ABNORMAL HIGH (ref 70–99)

## 2020-09-04 LAB — COMPREHENSIVE METABOLIC PANEL
ALT: 9 U/L (ref 0–44)
AST: 32 U/L (ref 15–41)
Albumin: 2.3 g/dL — ABNORMAL LOW (ref 3.5–5.0)
Alkaline Phosphatase: 50 U/L (ref 38–126)
Anion gap: 9 (ref 5–15)
BUN: 27 mg/dL — ABNORMAL HIGH (ref 8–23)
CO2: 25 mmol/L (ref 22–32)
Calcium: 7.2 mg/dL — ABNORMAL LOW (ref 8.9–10.3)
Chloride: 96 mmol/L — ABNORMAL LOW (ref 98–111)
Creatinine, Ser: 0.57 mg/dL — ABNORMAL LOW (ref 0.61–1.24)
GFR calc Af Amer: >60 mL/min (ref >60)
GFR calc non Af Amer: >60 mL/min (ref >60)
Glucose, Bld: 137 mg/dL — ABNORMAL HIGH (ref 70–99)
Potassium: 4.4 mmol/L (ref 3.5–5.1)
Sodium: 130 mmol/L — ABNORMAL LOW (ref 135–145)
Total Bilirubin: 0.9 mg/dL (ref 0.3–1.2)
Total Protein: 4 g/dL — ABNORMAL LOW (ref 6.5–8.1)

## 2020-09-04 LAB — CBC WITH DIFFERENTIAL/PLATELET
Abs Immature Granulocytes: 0.04 10*3/uL (ref 0.00–0.07)
Basophils Absolute: 0 10*3/uL (ref 0.0–0.1)
Basophils Relative: 1 %
Eosinophils Absolute: 0 10*3/uL (ref 0.0–0.5)
Eosinophils Relative: 1 %
HCT: 26.1 % — ABNORMAL LOW (ref 39.0–52.0)
Hemoglobin: 9.3 g/dL — ABNORMAL LOW (ref 13.0–17.0)
Immature Granulocytes: 2 %
Lymphocytes Relative: 42 %
Lymphs Abs: 0.7 10*3/uL (ref 0.7–4.0)
MCH: 28.9 pg (ref 26.0–34.0)
MCHC: 35.6 g/dL (ref 30.0–36.0)
MCV: 81.1 fL (ref 80.0–100.0)
Monocytes Absolute: 0.1 10*3/uL (ref 0.1–1.0)
Monocytes Relative: 5 %
Neutro Abs: 0.8 10*3/uL — ABNORMAL LOW (ref 1.7–7.7)
Neutrophils Relative %: 49 %
Platelets: 43 10*3/uL — ABNORMAL LOW (ref 150–400)
RBC: 3.22 MIL/uL — ABNORMAL LOW (ref 4.22–5.81)
RDW: 21.8 % — ABNORMAL HIGH (ref 11.5–15.5)
WBC: 1.7 10*3/uL — ABNORMAL LOW (ref 4.0–10.5)
nRBC: 0 % (ref 0.0–0.2)

## 2020-09-04 LAB — PHOSPHORUS: Phosphorus: 2.3 mg/dL — ABNORMAL LOW (ref 2.5–4.6)

## 2020-09-04 LAB — MAGNESIUM: Magnesium: 1.8 mg/dL (ref 1.7–2.4)

## 2020-09-04 MED ORDER — MAGNESIUM SULFATE 2 GM/50ML IV SOLN
2.0000 g | Freq: Once | INTRAVENOUS | Status: AC
  Administered 2020-09-04: 2 g via INTRAVENOUS
  Filled 2020-09-04: qty 50

## 2020-09-04 MED ORDER — PANTOPRAZOLE SODIUM 40 MG PO PACK
40.0000 mg | PACK | Freq: Every day | ORAL | Status: DC
  Administered 2020-09-05: 40 mg
  Filled 2020-09-04: qty 20

## 2020-09-04 MED ORDER — SODIUM PHOSPHATES 45 MMOLE/15ML IV SOLN
30.0000 mmol | Freq: Once | INTRAVENOUS | Status: AC
  Administered 2020-09-04: 30 mmol via INTRAVENOUS
  Filled 2020-09-04: qty 10

## 2020-09-04 MED ORDER — DRONABINOL 2.5 MG PO CAPS
2.5000 mg | ORAL_CAPSULE | Freq: Every day | ORAL | Status: DC
  Administered 2020-09-05 – 2020-09-06 (×2): 2.5 mg via ORAL
  Filled 2020-09-04 (×2): qty 1

## 2020-09-04 NOTE — Progress Notes (Addendum)
PROGRESS NOTE    Tony Marshall  XLK:[id number] DOB: [birth year] DOA: 09/06/2020 PCP: Timoteo Gaul, FNP    Chief Complaint  Patient presents with  . Hypotension  . Weakness    Brief Narrative:  49 yowm never smoker with history of HFrEF and CLL s/p three cycles chemo and rx avelox since 8/27    for PNA who presented with lethargy and poor po intake.  Found to have worsening bibasilar airspace disease and pleural effusions with hypotension despite 3.5L IVF. PCCM consulted for admission.  Labs were significant for WBC 3.3, Hgb 6.1 down from baseline of 9 several weeks ago, Lactic acid 3.2, Covid-19 negative.   Course complicated by right pneumothorax after right IJ attempt   Assessment & Plan:   Active Problems:   CLL (chronic lymphocytic leukemia) (HCC)   Acute respiratory failure (HCC)   Acute combined systolic and diastolic heart failure (HCC)   Shock circulatory (HCC)   Septic shock (HCC)   Iatrogenic pneumothorax on R    AKI (acute kidney injury) ([place])   Pressure injury of skin   Pneumothorax on right   Pneumonia due to infectious organism   Severe protein-calorie malnutrition (HCC)   Hypokalemia   Hypomagnesemia   Hypophosphatemia   Acute metabolic encephalopathy  1 septic shock secondary to pneumonia and hypovolemia due to poor oral intake, POA Improving.  Patient noted to have bout of hypotension the morning of 09/01/2020, which improved after a dose of IV albumin.  Chest x-ray done concerning for pneumonia.  Patient currently afebrile.  Patient has been off pressors since 08/30/2020 with some bouts of intermittent hypotension.  Status post IV cefepime x7 days.  Supportive care.  PCCM following.   2.  Pneumonia, possible aspiration pneumonia MRSA PCR negative.  Patient noted to have a history of Klebsiella in the sputum.  Patient seen by speech therapy who did not have any concerns for aspiration.  Afebrile.  Improved clinically.  Status post 7 days IV cefepime.   No further antibiotics needed at this time.  Follow.   3.  Right-sided pneumothorax Status post chest tube placement 08/27/2020.  Is noted per PCCM that tube was nonfunctional and replaced 08/31/2020 with reexpansion of the lung.  Still with air leak.  Patient noted the morning of 09/02/2020 with expanding pneumothorax per chest x-ray.  Patient assessed by critical care and tube noted to be nonfunctional and fixed per PCCM.  Per PCCM follow chest x-ray and if no improvement may need CT surgery to weigh in.  Per PCCM.   4.  Severe protein calorie malnutrition/FTT Currently on tube feeds to supplement oral intake.  Dietitian following.  IV albumin every 6 hours x1 day was done.  Patient with poor appetite.  Will start Marinol.  Consulted palliative care for goals of care.  5.  Acute kidney injury/metabolic acidosis Acute kidney injury improved.  Patient with acidosis improved on bicarb drip.  Discontinue bicarb drip.  Follow.    6.  Well-controlled diabetes mellitus type 2 Hemoglobin A1c 5.8 (08/28/2020).  CBGs of 120 this morning.  Patient on tube feeds.  Patient noted to be on Tresiba 12 units daily, NovoLog 3 to 5 units 3 times daily, Actos 15 mg daily.  Patient currently with poor oral intake.  Lantus 5 units daily.  Sliding scale insulin.  Patient on a regular diet however poor oral intake per RN.  Follow.    7.  Hypophosphatemia/hypomagnesemia/hypokalemia Patient received potassium, magnesium, phosphorus supplementation.  Potassium currently at  4.4.  Phosphorus at 2.3, magnesium at 1.8.  Give another dose of sodium phosphate 30 mmol IV x1.  Magnesium sulfate 2 g IV x1.  Repeat labs in the morning.   8.  Delirium/acute encephalopathy In the setting of critical illness and altered sleep-wake cycle.  Improved clinically.  Avoid benzos.  Seroquel DC'd per PCCM recommendations.  Follow.   9.  CLL Status post 3 cycles chemotherapy.  Outpatient follow-up with oncology.  10.  Pancytopenia Likely  secondary to sepsis/illness.  Patient with no overt bleeding.  Patient with history of CLL.  Oncology aware of admission.  CBC with white count of 1.7, hemoglobin of 9.3, platelet count of 43.  Patient with no bleeding. Transfusion threshold hemoglobin <7, platelet count < 10.  11.  Hyponatremia Patient with a mild hyponatremia.  Likely secondary to poor oral intake as well as free water flushes..  Patient on tube feeds.  Changed free water flushes to every 6 hours.  12.  Pressure ulcer sacrum medial stage II, POA Continue current wound care. Pressure Injury 08/27/20 Sacrum Medial Stage 2 -  Partial thickness loss of dermis presenting as a shallow open injury with a red, pink wound bed without slough. Red area with small area of broken skin in center (Active)  08/27/20 1024  Location: Sacrum  Location Orientation: Medial  Staging: Stage 2 -  Partial thickness loss of dermis presenting as a shallow open injury with a red, pink wound bed without slough.  Wound Description (Comments): Red area with small area of broken skin in center  Present on Admission: Yes     Pressure Injury 08/27/20 Nose Left Deep Tissue Pressure Injury - Purple or maroon localized area of discolored intact skin or blood-filled blister due to damage of underlying soft tissue from pressure and/or shear. (Active)  08/27/20 1024  Location: Nose  Location Orientation: Left  Staging: Deep Tissue Pressure Injury - Purple or maroon localized area of discolored intact skin or blood-filled blister due to damage of underlying soft tissue from pressure and/or shear.  Wound Description (Comments):   Present on Admission: Yes         DVT prophylaxis: SCDs Code Status: Partial Family Communication: Updated wife Enid Derry on the telephone.  Disposition:   Status is: Inpatient    Dispo: The patient is from: Home              Anticipated d/c is to: To be determined              Anticipated d/c date is: To be determined               Patient currently with a pneumothorax, chest tube in place, NG/panda tube in place receiving tube feeds, on IV antibiotics.  Poor oral intake.  Not stable for discharge.       Consultants:   PCCM admission  Procedures:   Chest x-ray 9/12 RIJ attempted, R pigtail chest tube by ED >> 9/16 removed since non functional 9/12 L Fem CVL >> 9/14 , pulled out 9/16 RT pigtail >>  Significant Hospital Events   9/11 Admit to PCCM 9/15 weaned off vasopressors ,increasing delirium, pulled out his central line      Antimicrobials:   IV cefepime 08/27/2020>>>>> 09/02/2020  IV Zosyn x1 dose 08/18/2020  IV vancomycin 08/27/2020>>>> 08/28/2020   Subjective: Patient laying in bed.  States has no appetite.  No oral intake today per RN.  On tube feeds.  Denies any chest pain.  No  shortness of breath.  Objective: Vitals:   09/03/20 0500 09/03/20 1316 09/03/20 2045 09/04/20 0401  BP:  122/70 108/61 123/69  Pulse:  78 76 77  Resp:  16 14 18   Temp:  98.2 F (36.8 C) 98.7 F (37.1 C) 98.4 F (36.9 C)  TempSrc:  Oral Oral Oral  SpO2:  100% 98% 99%  Weight: 58 kg     Height:        Intake/Output Summary (Last 24 hours) at 09/04/2020 1120 Last data filed at 09/04/2020 1031 Gross per 24 hour  Intake 3507.02 ml  Output 2425 ml  Net 1082.02 ml   Filed Weights   09/01/20 0145 09/02/20 0600 09/03/20 0500  Weight: 44.2 kg 55.3 kg 58 kg    Examination:  General exam: NAD. Alert.  Panda tube/ NGT in place.  Respiratory system: Clear to auscultation bilaterally anterior lung fields.  No wheezes, no crackles. Cardiovascular system: Regular rate rhythm no murmurs rubs or gallops.  No JVD.  No lower extremity edema.  Right-sided chest tube in place with serous drainage noted. Gastrointestinal system: Abdomen is soft, nontender, nondistended, positive bowel sounds.  No rebound.  No guarding.  Central nervous system: Alert and oriented to self and place.  Moving extremities spontaneously.   No focal neurological deficit.  Extremities: Symmetric 5 x 5 power. Skin: No rashes, lesions or ulcers Psychiatry: Judgement and insight appear normal. Mood & affect appropriate.     Data Reviewed: I have personally reviewed following labs and imaging studies  CBC: Recent Labs  Lab 08/30/20 1230 08/31/20 0243 09/01/20 0253 09/03/20 0658 09/04/20 0425  WBC 3.5* 4.9 2.9* 2.5* 1.7*  NEUTROABS 2.3  --   --   --  0.8*  HGB 10.9* 12.8* 11.6* 10.5* 9.3*  HCT 33.3* 39.5 36.5* 29.8* 26.1*  MCV 87.4 88.4 90.3 82.1 81.1  PLT 58* 66* 33* 38* 43*    Basic Metabolic Panel: Recent Labs  Lab 08/29/20 1641 08/30/20 1230 08/31/20 0243 09/01/20 0253 09/02/20 0838 09/03/20 0539 09/04/20 0425  NA  --    < > 135 134* 133* 131* 130*  K  --    < > 2.6* 3.0* 3.7 4.8 4.4  CL  --    < > 109 107 111 102 96*  CO2  --    < > 17* 17* 14* 20* 25  GLUCOSE  --    < > 91 243* 164* 152* 137*  BUN  --    < > 35* 33* 26* 29* 27*  CREATININE  --    < > 0.97 0.86 0.67 0.69 0.57*  CALCIUM  --    < > 8.6* 8.1* 7.7* 7.3* 7.2*  MG  --   --  1.9 1.5* 1.6* 2.2 1.8  PHOS 2.4*  --   --  <1.0* 1.3* 2.0* 2.3*   < > = values in this interval not displayed.    GFR: Estimated Creatinine Clearance: 64.4 mL/min (A) (by C-G formula based on SCr of 0.57 mg/dL (L)).  Liver Function Tests: Recent Labs  Lab 09/04/20 0425  AST 32  ALT 9  ALKPHOS 50  BILITOT 0.9  PROT 4.0*  ALBUMIN 2.3*    CBG: Recent Labs  Lab 09/03/20 1703 09/03/20 2037 09/04/20 0020 09/04/20 0357 09/04/20 0745  GLUCAP 149* 143* 139* 120* 241*     Recent Results (from the past 240 hour(s))  Culture, Urine     Status: Abnormal   Collection Time: 08/30/2020  3:49 PM  Specimen: Urine, Random  Result Value Ref Range Status   Specimen Description   Final    URINE, RANDOM Performed at [facility] [address] [place], [place] [address]    Special Requests   Final    NONE Performed at [facility], [place] [address] [place], [place] [address]    Culture (A)  Final    <10,000 COLONIES/mL INSIGNIFICANT GROWTH Performed at [facility] [address] [place], [place] [address]    Report Status 08/28/2020 FINAL  Final  SARS Coronavirus 2 by RT PCR (hospital order, performed in [facility] hospital lab) Nasopharyngeal Nasopharyngeal Swab     Status: None   Collection Time: 08/23/2020  4:10 PM   Specimen: Nasopharyngeal Swab  Result Value Ref Range Status   SARS Coronavirus 2 NEGATIVE NEGATIVE Final    Comment: (NOTE) SARS-CoV-2 target nucleic acids are NOT DETECTED.  The SARS-CoV-2 RNA is generally detectable in upper and lower respiratory specimens during the acute phase of infection. The lowest concentration of SARS-CoV-2 viral copies this assay can detect is 250 copies / mL. A negative result does not preclude SARS-CoV-2 infection and should not be used as the sole basis for treatment or other patient management decisions.  A negative result may occur with improper specimen collection / handling, submission of specimen other than nasopharyngeal swab, presence of viral mutation(s) within the areas targeted by this assay, and inadequate number of viral copies (<250 copies / mL). A negative result must be combined with clinical observations, patient history, and epidemiological information.  Fact Sheet for Patients:   StrictlyIdeas.no  Fact Sheet for Healthcare Providers: BankingDealers.co.za  This test is not yet approved or  cleared by the [place] FDA and has been authorized for detection and/or diagnosis of SARS-CoV-2 by FDA under an Emergency Use Authorization (EUA).  This EUA will remain in effect (meaning this test can be used) for the duration of the COVID-19 declaration under Section 564(b)(1) of the Act, 21 U.S.C. section 360bbb-3(b)(1), unless the authorization is terminated or revoked  sooner.  Performed at [facility], [place] [address], [place], [place] [address]   Expectorated sputum assessment w rflx to resp cult     Status: None   Collection Time: 08/27/20  7:17 AM   Specimen: Sputum  Result Value Ref Range Status   Specimen Description SPUTUM  Final   Special Requests NONE  Final   Sputum evaluation   Final    SPECIMEN MICROSCOPICALLY RESEMBLES SALIVA, NOT CULTURED Sputum specimen not acceptable for testing.  Please recollect.   Performed at [facility], [place] [address], [place], [place] [address]    Report Status 08/28/2020 FINAL  Final  MRSA PCR Screening     Status: None   Collection Time: 08/27/20 11:44 AM   Specimen: Nasopharyngeal  Result Value Ref Range Status   MRSA by PCR NEGATIVE NEGATIVE Final    Comment:        The GeneXpert MRSA Assay (FDA approved for NASAL specimens only), is one component of a comprehensive MRSA colonization surveillance program. It is not intended to diagnose MRSA infection nor to guide or monitor treatment for MRSA infections. Performed at [facility], [place] [address], [place], [place] [address]   Culture, blood (single)     Status: None   Collection Time: 08/27/20 12:12 PM   Specimen: Left Antecubital; Blood  Result Value Ref Range Status   Specimen Description  Final    LEFT ANTECUBITAL Performed at [facility] [address] [place], [place] [address]    Special Requests   Final    BOTTLES DRAWN AEROBIC ONLY Blood Culture results may not be optimal due to an inadequate volume of blood received in culture bottles Performed at [facility] [address] [place], [place] [address]    Culture   Final    NO GROWTH 5 DAYS Performed at [facility], [place] [address] [place], [place] [address]    Report Status 09/01/2020 FINAL  Final         Radiology Studies: DG CHEST PORT 1 VIEW  Result Date:  09/04/2020 CLINICAL DATA:  Pneumothorax follow-up. EXAM: PORTABLE CHEST 1 VIEW COMPARISON:  09/03/2020 chest radiograph and prior. FINDINGS: Apically terminating right chest tube. There is a mild kink involving the proximal tubing. Enteric tube tip overlies the gastric body. Right pneumothorax is increased in size and with new inferior component. Bilateral interstitial and patchy airspace opacities are unchanged. No left pneumothorax. Small left pleural effusion, unchanged. Partially obscured cardiomediastinal silhouette. IMPRESSION: Increased size of right pneumothorax. Indwelling apically terminating right chest tube with mild proximal kinking. Bilateral pulmonary opacities and small left pleural effusion, unchanged. These results will be called to the ordering clinician or representative by the Radiologist Assistant, and communication documented in the PACS or [employer]. Electronically Signed   By: Primitivo Gauze M.D.   On: 09/04/2020 09:12   DG CHEST PORT 1 VIEW  Result Date: 09/03/2020 CLINICAL DATA:  Follow-up pneumothorax. EXAM: PORTABLE CHEST 1 VIEW COMPARISON:  09/02/2020 FINDINGS: Stable position of right-sided chest tube. Right-sided pneumothorax is again identified and appears similar in volume to the previous exam predominantly overlying the medial right upper lobe and right apex. Unchanged appearance of bilateral pleural effusions. Bilateral interstitial and airspace opacities are also unchanged. IMPRESSION: 1. No change in right-sided pneumothorax. 2. No change in bilateral interstitial and airspace opacities. 3. Stable bilateral pleural effusions. Electronically Signed   By: Kerby Moors M.D.   On: 09/03/2020 08:17        Scheduled Meds: . Chlorhexidine Gluconate Cloth  6 each Topical Daily  . free water  200 mL Per Tube Q6H  . insulin aspart  0-15 Units Subcutaneous Q4H  . insulin glargine  5 Units Subcutaneous Daily  . mouth rinse  15 mL Mouth Rinse BID  .  multivitamin with minerals  1 tablet Per Tube Daily  . [START ON 09/05/2020] pantoprazole sodium  40 mg Per Tube Daily  . sodium chloride flush  10 mL Intracatheter Q8H  . sodium chloride flush  10-40 mL Intracatheter Q12H   Continuous Infusions: . sodium chloride 10 mL/hr at 09/01/20 1800  . feeding supplement (GLUCERNA 1.5 CAL) 45 mL/hr at 09/04/20 0503  .  sodium bicarbonate (isotonic) infusion in sterile water 50 mL/hr at 09/03/20 1109  . sodium phosphate  Dextrose 5% IVPB       LOS: 8 days    Time spent: 40 minutes    Irine Seal, MD [facility]   To contact the attending provider between 7A-7P or the covering provider during after hours 7P-7A, please log into the web site [email or web address] and access using universal [place] password for that web site. If you do not have the password, please call the hospital operator.  09/04/2020, 11:19 AM

## 2020-09-04 NOTE — Progress Notes (Signed)
    NAME:  Tony Marshall, MRN:  [id number], DOB:  February 16, 1941, LOS: 8 ADMISSION DATE:  08/23/2020, CONSULTATION DATE:  09/04/20 REFERRING MD:  EDP, CHIEF COMPLAINT:  lethargy  Brief History   79 yowm, never smoker, with history of HFrEF and CLL s/p three cycles chemo and rx avelox since 8/27 for PNA who presented with lethargy and poor PO intake.  Found to have worsening bibasilar airspace disease and pleural effusions with hypotension despite 3.5L IVF. PCCM consulted for admission. Labs were significant for WBC 3.3, Hgb 6.1 down from baseline of 9 several weeks ago, Lactic acid 3.2, Covid-19 negative. Course complicated by right pneumothorax after right IJ attempt.  Past Medical History  Atrial fibrillation not on anticoagulation HFrEF CLL s/p 3 cycles chemo DM HTN GERD OSA  Significant Hospital Events   9/11 Admit to PCCM 9/15 weaned off vasopressors ,increasing delirium, pulled out his central line  9/17 transfer to [facility], PCCM following for chest tube 9/20 Residual chest tube air leak 1/7  Consults:  None  Procedures:  9/12 RIJ attempted, R pigtail chest tube by ED >> 9/16 removed since non functional 9/12 L Fem CVL >> 9/14 , pulled out 9/16 RT pigtail >>  Significant Diagnostic Tests:     Micro Data:  Sars-Cov-2 PCR 9/11 > negative Sputum culture  9/12 > Blood Cx  9/12 > negative  Urine Cx  9/12 > insig growth  Antimicrobials:  Zozyn 9/11 only Vancomycin 9/11 >> 9/13 Cefepime 9/12 >> 9/18   Interim history/subjective:  Pt denies acute complaints  Afebrile  Chest tube in place with air leak   Objective   Blood pressure 123/69, pulse 77, temperature 98.4 F (36.9 C), temperature source Oral, resp. rate 18, height 5' 7.5" (1.715 m), weight 58 kg, SpO2 99 %.        Intake/Output Summary (Last 24 hours) at 09/04/2020 1133 Last data filed at 09/04/2020 1031 Gross per 24 hour  Intake 3507.02 ml  Output 2425 ml  Net 1082.02 ml   Exam: General: frail  elderly male lying in bed in NAD HEENT: MM pink/moist, small bore feeding tube in place, anicteric  Neuro: Awake, alert, oriented / interacts appropriately, generalized weakness  CV: s1s2 RRR, no m/r/g PULM: non-labored, distant breath sounds but clear, right chest tube in place with 1/7 airleak on -40 cm suction  GI: soft, bsx4 active  Extremities: warm/dry, no edema  Skin: no rashes or lesions  CXR 9/20 >> chest tube in good position, residual small pneumothorax    Resolved Hospital Problems  AKI, metabolic acidosis without anion gap, very mild lactic acidosis:  Septic shock Pneumonia, possible aspiration pneumonia  Assessment & Plan:   79 year old man with history of HFrEF and CLL s/p three cycles chemo with chronic dysphagia presenting with septic shock, aspiration pneumonia, right pneumothorax after IJ attempt.  R  Pneumothorax S/p chest tube placement on 9/12 -continue chest tube to -40 cm  -follow CXR  -dressing changed, connections secure -may need TCTS evaluation if not resolving but doubt he would be a candidate for surgical intervention given overall debility    PCCM will continue to follow chest tube, defer to [facility] for other management  Best practice:  Per primary team    Noe Gens, MSN, NP-C [facility] 09/04/2020, 11:40 AM   Please see [email or web address] for pager details.

## 2020-09-04 NOTE — Care Management Important Message (Signed)
Important Message  Patient Details  Name: MCKENZIE TORUNO MRN: [id number] Date of Birth: 08-31-1941   Medicare Important Message Given:  Yes (Medicare IM printed for Evette Cristal, to give to the patient)     Crista Luria 09/04/2020, 11:01 AM

## 2020-09-04 NOTE — Plan of Care (Signed)
  Problem: Clinical Measurements: Goal: Ability to maintain clinical measurements within normal limits will improve Outcome: Progressing Goal: Will remain free from infection Outcome: Progressing Goal: Diagnostic test results will improve Outcome: Progressing Goal: Respiratory complications will improve Outcome: Not Progressing Goal: Cardiovascular complication will be avoided Outcome: Progressing   Problem: Nutrition: Goal: Adequate nutrition will be maintained Outcome: Not Progressing   Problem: Coping: Goal: Level of anxiety will decrease Outcome: Progressing   Problem: Elimination: Goal: Will not experience complications related to bowel motility Outcome: Progressing Goal: Will not experience complications related to urinary retention Outcome: Progressing   Problem: Pain Managment: Goal: General experience of comfort will improve Outcome: Progressing   Problem: Safety: Goal: Ability to remain free from injury will improve Outcome: Progressing   Problem: Skin Integrity: Goal: Risk for impaired skin integrity will decrease Outcome: Progressing

## 2020-09-04 NOTE — Consult Note (Signed)
Consultation Note Date: 09/04/2020   Patient Name: Tony Marshall  DOB: Sep 21, 1941  MRN: [id number]  Age / Sex: 79 y.o., male  PCP: Timoteo Gaul, FNP Referring Physician: Eugenie Filler, MD  Reason for Consultation: Establishing goals of care  HPI/Patient Profile: 79 y.o. male  with past medical history of NHL, combined heart failure with EF 25-30%, HTN, afib, T2DM, anemia, CLL, frequent admissions this year for DKA, DVT, pneumonia  admitted on 08/27/2020 with sepsis due to recurrent pneumonia. He was treated with pressors and is now out of ICU- has NG tube in place for malnutrition- Albumin- 2.3 (has significant anasarca). Recovery further complicated by pneumothorax s/p R IJ placement requiring chest tube. Palliative medicine consulted for goals of care.    Clinical Assessment and Goals of Care: Evaluated patient and met at bedside with him and his spouse. Patient very quiet and did not engage significantly in discussion. Patient is familiar to palliative team having met him during his last admission at which time it was noted that he had significant decline in functional and nutritional status. After initially discussing option of home with hospice- family chose to discharge home with Palliative services support.  I met with patient and spouse today to followup on goals of care.  Patient is wheelchair bound at baseline. This has not changed.  Tony Marshall recognizes that Tony Marshall has not improved and perhaps has worsened since our last meeting.  We reviewed his hospitalization and goals of care. Illness trajectory and comorbidities discussed.  Tony Marshall and Kamon continue to wish to treat what is treatable.  Tube feeding was discussed. Deryk would not want placement of surgical feeding tube.  Tony Marshall expressed that she was very grateful for the Palliative provider who had visited her at home- she shared that  when they were ready to transition to Hospice she felt confident in communicating this to the community Palliative provider- however, she and family are not yet at that point.   Primary Decision Maker PATIENT- however, spouse primarily communicates and engages is discussion    SUMMARY OF RECOMMENDATIONS -Continue current scope of care- treat what is treatable -Patient does not want permanent feeding tube -Spouse is looking for primary care MD for patient -D/C home with Palliative- patient currently under care of [facility]    Code Status/Advance Care Planning:  Limited code  Prognosis:    < 6 months due to significant failure to thrive, heart failure, recurrent sepsis and pneumonia events  Discharge Planning: Home with Palliative Services  Primary Diagnoses: Present on Admission: . Septic shock ([place]) . CLL (chronic lymphocytic leukemia) ([place]) . Acute respiratory failure ([place]) . Acute combined systolic and diastolic heart failure ([place]) . Shock circulatory ([place])   I have reviewed the medical record, interviewed the patient and family, and examined the patient. The following aspects are pertinent.  Past Medical History:  Diagnosis Date  . A-fib ([place])   . CLL (chronic lymphocytic leukemia) ([place]) dx'd 2014   progression 12/2019  . Diabetes mellitus without complication ([place])   .  Diverticulosis   . External hemorrhoids   . GERD (gastroesophageal reflux disease)   . Hiatal hernia   . History of colon polyps   . Hyperlipidemia   . Hypertension   . Internal hemorrhoids   . Lymphocytosis   . Schatzki's ring   . Sleep apnea    Social History   Socioeconomic History  . Marital status: Married    Spouse name: Not on file  . Number of children: 1  . Years of education: Not on file  . Highest education level: Not on file  Occupational History  . Occupation: Retail buyer: [employer]  Tobacco Use  . Smoking status: Never Smoker  .  Smokeless tobacco: Former Systems developer    Types: Secondary school teacher  . Vaping Use: Never used  Substance and Sexual Activity  . Alcohol use: Yes    Alcohol/week: 6.0 standard drinks    Types: 6 Cans of beer per week  . Drug use: No  . Sexual activity: Yes  Other Topics Concern  . Not on file  Social History Narrative  . Not on file   Social Determinants of Health   Financial Resource Strain:   . Difficulty of Paying Living Expenses: Not on file  Food Insecurity:   . Worried About Charity fundraiser in the Last Year: Not on file  . Ran Out of Food in the Last Year: Not on file  Transportation Needs:   . Lack of Transportation (Medical): Not on file  . Lack of Transportation (Non-Medical): Not on file  Physical Activity:   . Days of Exercise per Week: Not on file  . Minutes of Exercise per Session: Not on file  Stress:   . Feeling of Stress : Not on file  Social Connections:   . Frequency of Communication with Friends and Family: Not on file  . Frequency of Social Gatherings with Friends and Family: Not on file  . Attends Religious Services: Not on file  . Active Member of Clubs or Organizations: Not on file  . Attends Archivist Meetings: Not on file  . Marital Status: Not on file   Scheduled Meds: . Chlorhexidine Gluconate Cloth  6 each Topical Daily  . dronabinol  2.5 mg Oral QAC lunch  . free water  200 mL Per Tube Q6H  . insulin aspart  0-15 Units Subcutaneous Q4H  . insulin glargine  5 Units Subcutaneous Daily  . mouth rinse  15 mL Mouth Rinse BID  . multivitamin with minerals  1 tablet Per Tube Daily  . [START ON 09/05/2020] pantoprazole sodium  40 mg Per Tube Daily  . sodium chloride flush  10 mL Intracatheter Q8H  . sodium chloride flush  10-40 mL Intracatheter Q12H   Continuous Infusions: . sodium chloride 10 mL/hr at 09/01/20 1800  . feeding supplement (GLUCERNA 1.5 CAL) 45 mL/hr at 09/04/20 1400  . magnesium sulfate bolus IVPB 2 g (09/04/20 1640)  .  sodium phosphate  Dextrose 5% IVPB 30 mmol (09/04/20 1206)   PRN Meds:.sodium chloride, acetaminophen (TYLENOL) oral liquid 160 mg/5 mL, benzonatate, docusate sodium, guaiFENesin-dextromethorphan, hydrALAZINE, ketorolac, polyethylene glycol, sodium chloride flush Medications Prior to Admission:  Prior to Admission medications   Medication Sig Start Date End Date Taking? Authorizing Provider  acetaminophen (TYLENOL) 500 MG tablet Take 500 mg by mouth every 6 (six) hours as needed for moderate pain.   Yes [provider]  albuterol (VENTOLIN HFA) 108 (90 Base)  MCG/ACT inhaler Inhale 2 puffs into the lungs every 4 (four) hours as needed for wheezing or shortness of breath.   Yes [provider]  allopurinol (ZYLOPRIM) 100 MG tablet Take 1 tablet (100 mg total) by mouth 2 (two) times daily. 03/28/20  Yes Curt Bears, MD  ALPRAZolam Duanne Moron) 0.25 MG tablet Take 1 tablet (0.25 mg total) by mouth at bedtime as needed for anxiety. 06/21/20  Yes Curt Bears, MD  amiodarone (PACERONE) 200 MG tablet Take 1 tablet (200 mg total) by mouth daily. 07/09/20  Yes Patrecia Pour, MD  benzonatate (TESSALON PERLES) 100 MG capsule Take 100 mg by mouth 3 (three) times daily as needed for cough.   Yes [provider]  carvedilol (COREG) 12.5 MG tablet Take 1 tablet (12.5 mg total) by mouth 2 (two) times daily with a meal. 06/20/20 08/17/2020 Yes Kc, Ramesh, MD  esomeprazole (NEXIUM) 40 MG capsule TAKE 1 CAPSULE BY MOUTH EVERY MORNING 30 MINUTES BEFORE BREAKFAST Patient taking differently: Take 40 mg by mouth daily.  04/28/20  Yes Pyrtle, Lajuan Lines, MD  feeding supplement, ENSURE ENLIVE, (ENSURE ENLIVE) LIQD Take 237 mLs by mouth 3 (three) times daily between meals. 07/08/20  Yes Patrecia Pour, MD  folic acid (FOLVITE) 1 MG tablet Take 1 mg by mouth daily.   Yes [provider]  glucagon (GLUCAGON EMERGENCY) 1 MG injection Inject 1 mg into the skin once as needed (For severe low blood sugar).  06/14/19  Yes Elayne Snare, MD  guaiFENesin (MUCINEX) 600 MG 12 hr tablet Take 1 tablet (600 mg total) by mouth 2 (two) times daily. 07/21/20  Yes Florencia Reasons, MD  HYDROcodone-acetaminophen (NORCO/VICODIN) 5-325 MG tablet Take 1 tablet by mouth every 6 (six) hours as needed for moderate pain.   Yes [provider]  insulin aspart (NOVOLOG FLEXPEN) 100 UNIT/ML FlexPen Inject 3-5 Units into the skin 3 (three) times daily with meals. Depending on the blood sugar level.   Yes [provider]  insulin degludec (TRESIBA FLEXTOUCH) 100 UNIT/ML FlexTouch Pen INJECT 12 UNITS DAILY. IF FASTING BLOOD SUGARS ARE OVER 140 INCREASE DOSAGE UP TO 15 UNITS DAILY AS DIRECTED. 07/17/20  Yes Elayne Snare, MD  moxifloxacin (AVELOX) 400 MG tablet Take 1 tablet (400 mg total) by mouth daily at 8 pm. 08/11/20  Yes Cleaver, Jossie Ng, NP  [facility] VERIO test strip USE 1 STRIP TO [employer] DAILY 02/24/20  Yes Elayne Snare, MD  oxymetazoline (AFRIN) 0.05 % nasal spray Place 1 spray into both nostrils 2 (two) times daily as needed for congestion.   Yes [provider]  pioglitazone (ACTOS) 15 MG tablet Take 15 mg by mouth daily. 06/26/20  Yes [provider]  polyethylene glycol (MIRALAX / GLYCOLAX) 17 g packet Take 17 g by mouth daily as needed for severe constipation. hold if having multiple loose stools per day 07/21/20  Yes Florencia Reasons, MD  potassium chloride SA (KLOR-CON) 20 MEQ tablet Take 1 tablet (20 mEq total) by mouth daily. 07/21/20  Yes Florencia Reasons, MD  rosuvastatin (CRESTOR) 5 MG tablet TAKE 1 TABLET BY MOUTH EVERY DAY Patient taking differently: Take 5 mg by mouth daily.  07/14/20  Yes Elayne Snare, MD  sacubitril-valsartan (ENTRESTO) 24-26 MG Take 2 tablets by mouth 2 (two) times daily.   Yes [provider]  senna (SENOKOT) 8.6 MG TABS tablet Take 1 tablet (8.6 mg total) by mouth at bedtime as needed for moderate constipation. 07/21/20  Yes Florencia Reasons,  MD  thiamine (VITAMIN B-1) 50 MG  tablet Take 50 mg by mouth daily.   Yes [provider]  chlorpheniramine-HYDROcodone (TUSSIONEX) 10-8 MG/5ML SUER Take 5 mLs by mouth every 12 (twelve) hours. Patient not taking: Reported on 08/29/2020 07/21/20   Florencia Reasons, MD  dronabinol (MARINOL) 2.5 MG capsule TAKE 1 CAPSULE BY MOUTH TWICE DAILY BEFORE A MEAL Patient not taking: Reported on 09/04/2020 07/15/20   Heilingoetter, Cassandra L, PA-C  guaiFENesin-codeine 100-10 MG/5ML syrup Take 5 mLs by mouth every 6 (six) hours as needed for cough. Patient not taking: Reported on 08/25/2020 07/28/20   Harle Stanford., PA-C  oxyCODONE (OXY IR/ROXICODONE) 5 MG immediate release tablet Take 1 tablet (5 mg total) by mouth every 4 (four) hours as needed for severe pain. 07/21/20   Florencia Reasons, MD  traMADol (ULTRAM) 50 MG tablet Take 1 tablet (50 mg total) by mouth every 12 (twelve) hours as needed. Patient not taking: Reported on 08/21/2020 07/21/20   Florencia Reasons, MD   Allergies  Allergen Reactions  . Glycopyrrolate Rash   Review of Systems  Constitutional: Positive for activity change, fatigue and unexpected weight change.  Respiratory: Positive for cough.     Physical Exam Vitals and nursing note reviewed.  Constitutional:      Appearance: He is ill-appearing.  Cardiovascular:     Comments: Diffuse anasarca Neurological:     Mental Status: He is alert.     Vital Signs: BP (!) 114/59 (BP Location: Left Arm)   Pulse 75   Temp 98.8 F (37.1 C) (Oral)   Resp 19   Ht 5' 7.5" (1.715 m)   Wt 58 kg   SpO2 95%   BMI 19.73 kg/m  Pain Scale: 0-10 POSS *See Group Information*: S-Acceptable,Sleep, easy to arouse Pain Score: 0-No pain   SpO2: SpO2: 95 % O2 Device:SpO2: 95 % O2 Flow Rate: .O2 Flow Rate (L/min): 1 L/min  IO: Intake/output summary:   Intake/Output Summary (Last 24 hours) at 09/04/2020 1643 Last data filed at 09/04/2020 1400 Gross per 24 hour  Intake 4739.24 ml  Output 2425 ml  Net 2314.24 ml    LBM: Last BM Date:  09/03/20 Baseline Weight: Weight: 52.3 kg Most recent weight: Weight: 58 kg     Palliative Assessment/Data: PPS: 20%     Thank you for this consult. Palliative medicine will continue to follow and assist as needed.    Time Total: 74 minutes Greater than 50%  of this time was spent counseling and coordinating care related to the above assessment and plan.  Signed by: Mariana Kaufman, AGNP-C Palliative Medicine    Please contact Palliative Medicine Team phone at [phone] for questions and concerns.  For individual provider: See Shea Evans

## 2020-09-04 NOTE — Progress Notes (Signed)
Anterior chest tube dressing removed, connections checked and tightened.  Air leak continues.  Old lateral chest tube site appears well healed, not open.  Anterior chest tube site redressed with tegaderm.  Assisted by RN at bedside.   Will monitor.  Noe Gens, MSN, NP-C [facility] 09/04/2020, 11:42 AM   Please see [email or web address] for pager details.

## 2020-09-05 ENCOUNTER — Inpatient Hospital Stay (HOSPITAL_COMMUNITY): Payer: Medicare HMO

## 2020-09-05 DIAGNOSIS — D61818 Other pancytopenia: Secondary | ICD-10-CM

## 2020-09-05 LAB — CBC WITH DIFFERENTIAL/PLATELET
Abs Immature Granulocytes: 0.05 10*3/uL (ref 0.00–0.07)
Basophils Absolute: 0 10*3/uL (ref 0.0–0.1)
Basophils Relative: 0 %
Eosinophils Absolute: 0 10*3/uL (ref 0.0–0.5)
Eosinophils Relative: 0 %
HCT: 28.7 % — ABNORMAL LOW (ref 39.0–52.0)
Hemoglobin: 9.6 g/dL — ABNORMAL LOW (ref 13.0–17.0)
Immature Granulocytes: 5 %
Lymphocytes Relative: 34 %
Lymphs Abs: 0.4 10*3/uL — ABNORMAL LOW (ref 0.7–4.0)
MCH: 28.7 pg (ref 26.0–34.0)
MCHC: 33.4 g/dL (ref 30.0–36.0)
MCV: 85.7 fL (ref 80.0–100.0)
Monocytes Absolute: 0 10*3/uL — ABNORMAL LOW (ref 0.1–1.0)
Monocytes Relative: 4 %
Neutro Abs: 0.6 10*3/uL — ABNORMAL LOW (ref 1.7–7.7)
Neutrophils Relative %: 57 %
Platelets: 35 10*3/uL — ABNORMAL LOW (ref 150–400)
RBC: 3.35 MIL/uL — ABNORMAL LOW (ref 4.22–5.81)
RDW: 22.2 % — ABNORMAL HIGH (ref 11.5–15.5)
WBC: 1.1 10*3/uL — CL (ref 4.0–10.5)
nRBC: 0 % (ref 0.0–0.2)

## 2020-09-05 LAB — BASIC METABOLIC PANEL
Anion gap: 9 (ref 5–15)
BUN: 27 mg/dL — ABNORMAL HIGH (ref 8–23)
CO2: 29 mmol/L (ref 22–32)
Calcium: 7.5 mg/dL — ABNORMAL LOW (ref 8.9–10.3)
Chloride: 96 mmol/L — ABNORMAL LOW (ref 98–111)
Creatinine, Ser: 0.54 mg/dL — ABNORMAL LOW (ref 0.61–1.24)
GFR calc Af Amer: >60 mL/min (ref >60)
GFR calc non Af Amer: >60 mL/min (ref >60)
Glucose, Bld: 169 mg/dL — ABNORMAL HIGH (ref 70–99)
Potassium: 3.7 mmol/L (ref 3.5–5.1)
Sodium: 134 mmol/L — ABNORMAL LOW (ref 135–145)

## 2020-09-05 LAB — MAGNESIUM: Magnesium: 2 mg/dL (ref 1.7–2.4)

## 2020-09-05 LAB — GLUCOSE, CAPILLARY
Glucose-Capillary: 104 mg/dL — ABNORMAL HIGH (ref 70–99)
Glucose-Capillary: 126 mg/dL — ABNORMAL HIGH (ref 70–99)
Glucose-Capillary: 141 mg/dL — ABNORMAL HIGH (ref 70–99)
Glucose-Capillary: 150 mg/dL — ABNORMAL HIGH (ref 70–99)
Glucose-Capillary: 154 mg/dL — ABNORMAL HIGH (ref 70–99)
Glucose-Capillary: 161 mg/dL — ABNORMAL HIGH (ref 70–99)
Glucose-Capillary: 214 mg/dL — ABNORMAL HIGH (ref 70–99)
Glucose-Capillary: 69 mg/dL — ABNORMAL LOW (ref 70–99)
Glucose-Capillary: 87 mg/dL (ref 70–99)

## 2020-09-05 LAB — PHOSPHORUS: Phosphorus: 2.3 mg/dL — ABNORMAL LOW (ref 2.5–4.6)

## 2020-09-05 MED ORDER — TRAZODONE HCL 50 MG PO TABS
50.0000 mg | ORAL_TABLET | Freq: Every day | ORAL | Status: AC
  Administered 2020-09-05: 50 mg via ORAL
  Filled 2020-09-05: qty 1

## 2020-09-05 MED ORDER — PANTOPRAZOLE SODIUM 40 MG PO TBEC
40.0000 mg | DELAYED_RELEASE_TABLET | Freq: Every day | ORAL | Status: DC
  Administered 2020-09-06 – 2020-09-12 (×6): 40 mg via ORAL
  Filled 2020-09-05 (×7): qty 1

## 2020-09-05 MED ORDER — ADULT MULTIVITAMIN W/MINERALS CH
1.0000 | ORAL_TABLET | Freq: Every day | ORAL | Status: DC
  Administered 2020-09-06 – 2020-09-11 (×4): 1 via ORAL
  Filled 2020-09-05 (×7): qty 1

## 2020-09-05 MED ORDER — DEXTROSE 50 % IV SOLN
INTRAVENOUS | Status: AC
  Administered 2020-09-05: 25 mL
  Filled 2020-09-05: qty 50

## 2020-09-05 MED ORDER — SODIUM PHOSPHATES 45 MMOLE/15ML IV SOLN
30.0000 mmol | Freq: Once | INTRAVENOUS | Status: AC
  Administered 2020-09-05: 30 mmol via INTRAVENOUS
  Filled 2020-09-05: qty 10

## 2020-09-05 MED ORDER — FUROSEMIDE 10 MG/ML IJ SOLN
20.0000 mg | Freq: Two times a day (BID) | INTRAMUSCULAR | Status: AC
  Administered 2020-09-05 – 2020-09-06 (×4): 20 mg via INTRAVENOUS
  Filled 2020-09-05 (×4): qty 2

## 2020-09-05 NOTE — Progress Notes (Addendum)
Hypoglycemic Event  CBG: 69  Treatment: 50% Dextrose of 75ml  Symptoms: none  Follow-up CBG: Time:2315 CBG Result:154  Possible Reasons for Event: poor appetite  Comments/MD notified: Yes,  M.Sharlet Salina The nurse gave the patient some orange juice, took sip of it [insignificant amount] and refused to drink.  Michaelah [employer]

## 2020-09-05 NOTE — Progress Notes (Signed)
    NAME:  Tony Marshall, MRN:  [id number], DOB:  02-Mar-1941, LOS: 9 ADMISSION DATE:  09/01/2020, CONSULTATION DATE:  09/05/20 REFERRING MD:  EDP, CHIEF COMPLAINT:  lethargy  Brief History   79 yowm, never smoker, with history of HFrEF and CLL s/p three cycles chemo and rx avelox since 8/27 for PNA who presented with lethargy and poor PO intake.  Found to have worsening bibasilar airspace disease and pleural effusions with hypotension despite 3.5L IVF. PCCM consulted for admission. Labs were significant for WBC 3.3, Hgb 6.1 down from baseline of 9 several weeks ago, Lactic acid 3.2, Covid-19 negative. Course complicated by right pneumothorax after right IJ attempt.  Past Medical History  Atrial fibrillation not on anticoagulation HFrEF CLL s/p 3 cycles chemo DM HTN GERD OSA  Significant Hospital Events   9/11 Admit to PCCM 9/15 weaned off vasopressors ,increasing delirium, pulled out his central line  9/17 transfer to [facility], PCCM following for chest tube 9/20 Residual chest tube air leak 1/7  Consults:  None  Procedures:  9/12 RIJ attempted, R pigtail chest tube by ED >> 9/16 removed since non functional 9/12 L Fem CVL >> 9/14 , pulled out 9/16 RT pigtail >>  Significant Diagnostic Tests:     Micro Data:  Sars-Cov-2 PCR 9/11 > negative Sputum culture  9/12 > Blood Cx  9/12 > negative  Urine Cx  9/12 > insig growth  Antimicrobials:  Zozyn 9/11 only Vancomycin 9/11 >> 9/13 Cefepime 9/12 >> 9/18   Interim history/subjective:   Patient is stable.  Continues to have [facility] Chest x-ray with stable apical pneumothorax  Objective   Blood pressure (!) 147/80, pulse 77, temperature 100.2 F (37.9 C), temperature source Oral, resp. rate 18, height 5' 7.5" (1.715 m), weight 55.3 kg, SpO2 97 %.        Intake/Output Summary (Last 24 hours) at 09/05/2020 1012 Last data filed at 09/05/2020 0600 Gross per 24 hour  Intake 1685.91 ml  Output 1075 ml  Net 610.91 ml    Exam: Gen:      No acute distress, frail, elderly HEENT:  EOMI, sclera anicteric Neck:     No masses; no thyromegaly Lungs:    Clear to auscultation bilaterally; normal respiratory effort CV:         Regular rate and rhythm; no murmurs Abd:      + bowel sounds; soft, non-tender; no palpable masses, no distension Ext:    No edema; adequate peripheral perfusion Skin:      Warm and dry; no rash Neuro: alert and oriented x 3 Psych: normal mood and affect  Chest x-ray 9/21-stable small apical pneumothorax   Resolved Hospital Problems  AKI, metabolic acidosis without anion gap, very mild lactic acidosis:  Septic shock Pneumonia, possible aspiration pneumonia  Assessment & Plan:   79 year old man with history of HFrEF and CLL s/p three cycles chemo with chronic dysphagia presenting with septic shock, aspiration pneumonia, right pneumothorax after IJ attempt.  R  Pneumothorax S/p chest tube placement on 9/12 Continue chest tube to suction at -40 Follow chest x-ray May need TCTS evaluation if not resolving but doubt he would be a candidate for surgical intervention given overall debility   Agree with palliative care assessment   PCCM will continue to follow chest tube, defer to [facility] for other management  Best practice:  Per primary team  Marshell Garfinkel MD [facility] Please see [email or web address] for pager details.  09/05/2020, 10:16 AM

## 2020-09-05 NOTE — Progress Notes (Signed)
HEMATOLOGY-ONCOLOGY PROGRESS NOTE  SUBJECTIVE: Tony Marshall is currently hospitalized with septic shock secondary to pneumonia and hypovolemia, aspiration pneumonia, and right pneumothorax. I was asked by hospitalist to see the patient today due to pancytopenia. The patient previously received systemic chemotherapy with bendamustine and Rituxan and completed 3 cycles. His chemotherapy was stopped after 3 cycles due to poor tolerance. He is currently on observation. He has had multiple hospitalizations over the past few months.  Records from this hospitalization were reviewed and the patient has completed a 7-day course of cefepime. He was noted to have a low-grade fever this morning of 100.2. Repeat chest x-ray from this morning shows a stable right chest tube and bilateral pulmonary infiltrate/edema with slight progression. The patient had an NG tube in place which he pulled out overnight. He reports that he feels okay this morning. Still with a cough. No complaints of shortness of breath. Denies bleeding.  Oncology History  CLL (chronic lymphocytic leukemia) ([place])  10/25/2013 Initial Diagnosis   CLL (chronic lymphocytic leukemia) ([place])   04/04/2020 -  Chemotherapy   The patient had palonosetron (ALOXI) injection 0.25 mg, 0.25 mg, Intravenous,  Once, 3 of 4 cycles Administration: 0.25 mg (04/04/2020), 0.25 mg (05/02/2020), 0.25 mg (05/30/2020) bendamustine (BENDEKA) 175 mg in sodium chloride 0.9 % 50 mL (3.0702 mg/mL) chemo infusion, 90 mg/m2 = 175 mg, Intravenous,  Once, 3 of 4 cycles Dose modification: 70 mg/m2 (original dose 90 mg/m2, Cycle 3, Reason: Provider Judgment, Comment: ANC ), 70 mg/m2 (original dose 90 mg/m2, Cycle 3, Reason: Other (see comments), Comment: low ANC) Administration: 175 mg (04/04/2020), 175 mg (04/05/2020), 150 mg (05/02/2020), 150 mg (05/03/2020), 125 mg (05/30/2020), 125 mg (05/31/2020) riTUXimab-pvvr (RUXIENCE) 700 mg in sodium chloride 0.9 % 250 mL (2.1875 mg/mL) infusion,  375 mg/m2 = 700 mg, Intravenous,  Once, 2 of 2 cycles Administration: 700 mg (04/04/2020)  for chemotherapy treatment.       REVIEW OF SYSTEMS:   Constitutional: Low-grade fever noted, no chills, poor appetite Eyes: Denies blurriness of vision Ears, nose, mouth, throat, and face: Denies mucositis or sore throat Respiratory: Reports cough but no hemoptysis Cardiovascular: Denies palpitation, chest discomfort Gastrointestinal:  Denies nausea, heartburn or change in bowel habits Skin: Bruises easily Neurological:Denies numbness, tingling or new weaknesses Behavioral/Psych: Mood is stable, no new changes  Extremities: No lower extremity edema All other systems were reviewed with the patient and are negative.  I have reviewed the past medical history, past surgical history, social history and family history with the patient and they are unchanged from previous note.   PHYSICAL EXAMINATION: ECOG PERFORMANCE STATUS: 3 - Symptomatic, >50% confined to bed  Vitals:   09/04/20 2010 09/05/20 0423  BP: 112/67 (!) 147/80  Pulse: 72 77  Resp: 18 18  Temp: 98.6 F (37 C) 100.2 F (37.9 C)  SpO2: 96% 97%   Filed Weights   09/02/20 0600 09/03/20 0500 09/05/20 0423  Weight: 55.3 kg 58 kg 55.3 kg    Intake/Output from previous day: 09/20 0701 - 09/21 0700 In: 1695.9 [P.O.:80; I.V.:610; NG/GT:796; IV Piggyback:209.9] Out: 1075 [Urine:700; Stool:225; Chest Tube:150]  GENERAL: Chronically ill-appearing, cachectic, no distress SKIN: No petechiae, dry scab at left nares EYES: normal, Conjunctiva are pink and non-injected, sclera clear OROPHARYNX:no exudate, no erythema and lips, buccal mucosa, and tongue normal  LUNGS: clear to auscultation and percussion with normal breathing effort HEART: regular rate & rhythm and no murmurs and no lower extremity edema ABDOMEN:abdomen soft, non-tender and normal bowel sounds  NEURO: alert & oriented x 3 with fluent speech, no focal motor/sensory  deficits  LABORATORY DATA:  I have reviewed the data as listed CMP Latest Ref Rng & Units 09/05/2020 09/04/2020 09/03/2020  Glucose 70 - 99 mg/dL 169(H) 137(H) 152(H)  BUN 8 - 23 mg/dL 27(H) 27(H) 29(H)  Creatinine 0.61 - 1.24 mg/dL 0.54(L) 0.57(L) 0.69  Sodium 135 - 145 mmol/L 134(L) 130(L) 131(L)  Potassium 3.5 - 5.1 mmol/L 3.7 4.4 4.8  Chloride 98 - 111 mmol/L 96(L) 96(L) 102  CO2 22 - 32 mmol/L 29 25 20(L)  Calcium 8.9 - 10.3 mg/dL 7.5(L) 7.2(L) 7.3(L)  Total Protein 6.5 - 8.1 g/dL - 4.0(L) -  Total Bilirubin 0.3 - 1.2 mg/dL - 0.9 -  Alkaline Phos 38 - 126 U/L - 50 -  AST 15 - 41 U/L - 32 -  ALT 0 - 44 U/L - 9 -    Lab Results  Component Value Date   WBC 1.1 (LL) 09/05/2020   HGB 9.6 (L) 09/05/2020   HCT 28.7 (L) 09/05/2020   MCV 85.7 09/05/2020   PLT 35 (L) 09/05/2020   NEUTROABS 0.6 (L) 09/05/2020    DG Chest 2 View  Result Date: 08/10/2020 CLINICAL DATA:  79 year old male with history of productive cough for the past 3-4 weeks. CHF. Shortness of breath. EXAM: CHEST - 2 VIEW COMPARISON:  Chest x-ray 07/19/2020. FINDINGS: Airspace consolidation and volume loss in the left lower lobe concerning for pneumonia. Small left pleural effusion. Small bilateral pleural effusions. No evidence of pulmonary edema. No pneumothorax. No suspicious appearing pulmonary nodules or masses are noted. Heart size is normal. Mitral annular calcifications. Upper mediastinal contours are within normal limits. Aortic atherosclerosis. IMPRESSION: 1. Combination of airspace consolidation and atelectasis in the left lower lobe indicative of pneumonia. 2. Small bilateral pleural effusions. 3. Aortic atherosclerosis. Electronically Signed   By: Vinnie Langton M.D.   On: 08/10/2020 16:37   DG Abd 1 View  Result Date: 08/31/2020 CLINICAL DATA:  Encounter for feeding tube placement EXAM: ABDOMEN - 1 VIEW COMPARISON:  Abdominal radiograph of 08/30/2020 FINDINGS: RIGHT-sided chest tube remains in place. Small  amount of subcutaneous emphysema. Additional chest tube has been placed since previous chest radiograph. Tip of this chest tube cannot be seen, better seen on subsequent chest radiograph. The RIGHT hemidiaphragm no longer shows depression and lucency that was noted on the previous exam. There is RIGHT and LEFT basilar airspace disease likely with LEFT-sided pleural fluid layering dependently. IMPRESSION: 1. Interval improvement with respect to basilar portion of pneumothorax seen on previous imaging following placement of a second chest tube. 2. Effusion and basilar airspace disease on the LEFT. 3. Feeding tube in the mid stomach. Electronically Signed   By: Zetta Bills M.D.   On: 08/31/2020 11:26   DG Abd 1 View  Result Date: 08/30/2020 CLINICAL DATA:  Nasogastric tube placement EXAM: ABDOMEN - 1 VIEW COMPARISON:  5:46 p.m. FINDINGS: Nasoenteric feeding tube has been partially withdrawn and its tip is now seen just beyond the expected gastroesophageal junction. Visualized abdominal gas pattern is unremarkable. Small left pleural effusion is present. Right basilar pigtail chest tube is unchanged in position. There is depression of the right hemidiaphragm in keeping with the presence of a large right pneumothorax, however, the degree of diaphragmatic depression and right lung hyperinflation has improved when compared to prior examination suggesting at least partial decompression through the chest tube. IMPRESSION: 1. Nasoenteric feeding tube has been partially withdrawn and its tip  is now seen just beyond the expected gastroesophageal junction. Advancement of the catheter by at least 20 cm may more optimally position the catheter within the duodenum. 2. Improved right-sided pneumothorax, however with persistent diaphragmatic depression and right lung hyperinflation. Correlation for appropriate function of the chest tube is recommended. Electronically Signed   By: Fidela Salisbury MD   On: 08/30/2020 21:47   DG  CHEST PORT 1 VIEW  Result Date: 09/05/2020 CLINICAL DATA:  Pneumothorax. EXAM: PORTABLE CHEST 1 VIEW COMPARISON:  09/04/2020. FINDINGS: Interim removal of feeding tube. Right chest tube in stable position. Stable small right apical pneumothorax. Stable cardiomegaly. Bibasilar atelectasis. Bilateral pulmonary infiltrates/edema. Slight progression from prior exam. Small bilateral pleural effusions again noted. IMPRESSION: 1. Interim removal of feeding tube. Right chest tube in stable position. Stable small right apical pneumothorax. 2.  Stable cardiomegaly. 3. Bibasilar atelectasis. Bilateral pulmonary infiltrates/edema again noted. Slight progression from prior exam. Small bilateral pleural effusions again noted. Electronically Signed   By: Marcello Moores  Register   On: 09/05/2020 05:36   DG CHEST PORT 1 VIEW  Result Date: 09/04/2020 CLINICAL DATA:  Shortness of breath, pneumothorax. EXAM: PORTABLE CHEST 1 VIEW COMPARISON:  Same day. FINDINGS: Stable cardiomediastinal silhouette. Right-sided chest tube is again noted. Small right apical pneumothorax is noted which is decreased compared to prior exam. Stable bibasilar opacities are noted with associated pleural effusions, left greater than right. Bony thorax is unremarkable. Feeding tube is seen entering stomach. IMPRESSION: Right-sided chest tube is again noted. Small right apical pneumothorax is noted which is decreased compared to prior exam. Stable bibasilar opacities are noted with associated pleural effusions, left greater than right. Electronically Signed   By: Marijo Conception M.D.   On: 09/04/2020 13:11   DG CHEST PORT 1 VIEW  Result Date: 09/04/2020 CLINICAL DATA:  Pneumothorax follow-up. EXAM: PORTABLE CHEST 1 VIEW COMPARISON:  09/03/2020 chest radiograph and prior. FINDINGS: Apically terminating right chest tube. There is a mild kink involving the proximal tubing. Enteric tube tip overlies the gastric body. Right pneumothorax is increased in size and  with new inferior component. Bilateral interstitial and patchy airspace opacities are unchanged. No left pneumothorax. Small left pleural effusion, unchanged. Partially obscured cardiomediastinal silhouette. IMPRESSION: Increased size of right pneumothorax. Indwelling apically terminating right chest tube with mild proximal kinking. Bilateral pulmonary opacities and small left pleural effusion, unchanged. These results will be called to the ordering clinician or representative by the Radiologist Assistant, and communication documented in the PACS or [employer]. Electronically Signed   By: Primitivo Gauze M.D.   On: 09/04/2020 09:12   DG CHEST PORT 1 VIEW  Result Date: 09/03/2020 CLINICAL DATA:  Follow-up pneumothorax. EXAM: PORTABLE CHEST 1 VIEW COMPARISON:  09/02/2020 FINDINGS: Stable position of right-sided chest tube. Right-sided pneumothorax is again identified and appears similar in volume to the previous exam predominantly overlying the medial right upper lobe and right apex. Unchanged appearance of bilateral pleural effusions. Bilateral interstitial and airspace opacities are also unchanged. IMPRESSION: 1. No change in right-sided pneumothorax. 2. No change in bilateral interstitial and airspace opacities. 3. Stable bilateral pleural effusions. Electronically Signed   By: Kerby Moors M.D.   On: 09/03/2020 08:17   DG CHEST PORT 1 VIEW  Result Date: 09/02/2020 CLINICAL DATA:  Follow-up pneumothorax right side. EXAM: PORTABLE CHEST 1 VIEW COMPARISON:  09/02/2020 FINDINGS: Enteric tube unchanged coursing into the region of the stomach and off the film as tip is not visualized. Right-sided pigtail pleural drainage catheter unchanged. Previously  seen right-sided pneumothorax is not visualized on the current exam. Mild interval improvement hazy opacification over the left perihilar region/left base. Persistent hazy density over the medial right base unchanged. Cardiomediastinal silhouette and  remainder of the exam is unchanged. IMPRESSION: 1. Interval improvement in hazy opacification over the left perihilar region/left base. Stable hazy density medial right base. 2. Right-sided pigtail pleural drainage catheter unchanged. Previously seen right-sided pneumothorax no longer visualized. Electronically Signed   By: Marin Olp M.D.   On: 09/02/2020 11:31   DG Chest Port 1 View  Result Date: 09/02/2020 CLINICAL DATA:  Follow-up pneumothorax. EXAM: PORTABLE CHEST 1 VIEW COMPARISON:  09/01/2020 FINDINGS: Right-sided chest tube is in place. The pigtail overlies the right upper lobe. Recurrent small to moderate right-sided pneumothorax is identified. Persistent opacification within the right mid and lower lung. Diffuse opacification of the left lung, unchanged. Small pleural effusions, stable. IMPRESSION: 1. Recurrent small to moderate right-sided pneumothorax. 2. No change in aeration to the lungs compared with previous exam. 3. Critical Value/emergent results were called by telephone at the time of interpretation on 09/02/2020 at 6:57 am to provider Nurse Karrie Doffing, who verbally acknowledged these results. Electronically Signed   By: Kerby Moors M.D.   On: 09/02/2020 06:57   DG Chest Port 1 View  Result Date: 09/01/2020 CLINICAL DATA:  Pneumothorax EXAM: PORTABLE CHEST 1 VIEW COMPARISON:  08/31/2020 FINDINGS: Interval removal of 1 of the 2 right chest tubes. No visible pneumothorax. Airspace disease throughout the left lung with possible layering left effusion, unchanged. Heart is borderline in size. IMPRESSION: Interval removal of 1 of the 2 right chest tubes without visible pneumothorax. Stable hazy opacity throughout the left lung, likely layering effusion and airspace disease. Electronically Signed   By: Rolm Baptise M.D.   On: 09/01/2020 04:46   DG CHEST PORT 1 VIEW  Result Date: 08/31/2020 CLINICAL DATA:  Encounter for chest tube placement EXAM: PORTABLE CHEST 1 VIEW COMPARISON:   Earlier today FINDINGS: A new chest tube has been placed with tip at the apex. The right base tube is in stable position. Significant evacuation of right pneumothorax with no definite residual seen. Hazy opacification of the left chest from pleural fluid and pulmonary density. Feeding tube has been placed with indwelling wire and weight over the stomach. Stable heart size. IMPRESSION: 1. Additional right chest tube with evacuated pneumothorax. 2. Continued hazy opacification of the left chest, likely both pleural fluid and pulmonary opacity. Electronically Signed   By: Monte Fantasia M.D.   On: 08/31/2020 11:23   DG CHEST PORT 1 VIEW  Result Date: 08/31/2020 CLINICAL DATA:  Pneumothorax EXAM: PORTABLE CHEST 1 VIEW COMPARISON:  August 31, 2020 FINDINGS: The cardiomediastinal silhouette is unchanged in contour.Atherosclerotic calcifications of the aorta. Small LEFT pleural effusion. Persistent moderate RIGHT pneumothorax, minimally decreased in comparison to prior. Persistent opacification of the RIGHT lung along the RIGHT lower hilar border. Mildly improved aeration of the LEFT lung. Patchy areas of atelectasis and edema throughout the LEFT lung. Visualized abdomen is unremarkable. Multilevel degenerative changes of the thoracic spine. IMPRESSION: 1. Persistent moderate RIGHT pneumothorax, minimally decreased to similar in comparison to prior. 2. Persistent opacification of the RIGHT lung along the RIGHT lower hilar border, likely atelectasis. 3. Mildly improved aeration of the LEFT lung. Electronically Signed   By: Valentino Saxon MD   On: 08/31/2020 09:18   DG CHEST PORT 1 VIEW  Result Date: 08/31/2020 CLINICAL DATA:  Follow-up pneumothorax. EXAM: PORTABLE CHEST 1 VIEW COMPARISON:  08/29/2020 FINDINGS: The right-sided chest tube is stable in position. Enlarging right pneumothorax now estimated at 50%. Mild shift of the heart to the left side and lower left lung volumes with progressive left lung  atelectasis. IMPRESSION: Enlarging right pneumothorax now estimated at 50%. Some component of a tension pneumothorax is noted with slight shift of the heart to the left and lower left lung volumes with compressive atelectasis. These results will be called to the ordering clinician or representative by the Radiologist Assistant, and communication documented in the PACS or [employer]. Electronically Signed   By: Marijo Sanes M.D.   On: 08/31/2020 06:41   DG CHEST PORT 1 VIEW  Result Date: 08/29/2020 CLINICAL DATA:  Follow-up pneumothorax. EXAM: PORTABLE CHEST 1 VIEW COMPARISON:  08/28/2020 FINDINGS: Right chest tube remains in place. Enlargement of the right pneumothorax. Visceral to parietal pleural density in the right upper chest 3 cm compared with 2 cm yesterday. Moderate atelectasis in both lower lobes. No left pneumothorax. Probable small effusion on the left. Mild cardiomegaly and aortic atherosclerosis/coronary artery atherosclerosis again noted. IMPRESSION: 1. Enlargement of the right pneumothorax. Right chest tube remains in place. No sign of tension or mediastinal shift. 2. Moderate atelectasis in both lower lobes. Probable small left effusion. Electronically Signed   By: Nelson Chimes M.D.   On: 08/29/2020 10:40   DG CHEST PORT 1 VIEW  Result Date: 08/28/2020 CLINICAL DATA:  Shortness of breath EXAM: PORTABLE CHEST 1 VIEW COMPARISON:  Earlier today FINDINGS: Decrease in right pneumothorax, no longer seen at the base. Moderate pneumothorax still at the right apex, although decreased at 2 cm in thickness compared to 3 cm earlier. Stable lower lobe opacity and left pleural effusion. Coronary and mitral annular calcification. IMPRESSION: Decreased right pneumothorax. Electronically Signed   By: Monte Fantasia M.D.   On: 08/28/2020 10:06   DG Chest Port 1 View  Result Date: 08/28/2020 CLINICAL DATA:  Acute respiratory failure EXAM: PORTABLE CHEST 1 VIEW COMPARISON:  Yesterday FINDINGS: Right  chest tube in place. There is a moderate right pneumothorax which is increasing at the base where hazy density is decreased and there may have been evacuation of pleural fluid. Pneumothorax thickness at the apex is similar to before at nearly 3 cm. Airspace opacity at the right lower lobe. Continued hazy opacity at the left base from both pleural fluid and parenchymal disease. Normal heart size. No mediastinal shift. These results will be called to the ordering clinician or representative by the Radiologist Assistant, and communication documented in the PACS or [employer]. IMPRESSION: 1. Moderate right pneumothorax with interval increase at the base where there may have been interval evacuation of pleural fluid. At the apex, thickness is unchanged at 3 cm. 2. Bilateral lower lobe opacity and left pleural effusion. Electronically Signed   By: Monte Fantasia M.D.   On: 08/28/2020 05:39   DG Chest Port 1 View  Result Date: 08/27/2020 CLINICAL DATA:  RIGHT pneumothorax. EXAM: PORTABLE CHEST 1 VIEW COMPARISON:  Radiograph 08/27/2020 at 447 hours FINDINGS: Interval placement of a RIGHT pigtail catheter chest tube. Persistent RIGHT pneumothorax which measures 33 mm from the apical chest wall compared to 39 mm for minimal expansion. Persistent bilateral pleural effusions. No mediastinal shift identified. IMPRESSION: 1. Interval placement of RIGHT chest tube with persistent RIGHT pneumothorax. Mild improvement. 2. Persistent bilateral pleural effusions. Electronically Signed   By: Suzy Bouchard M.D.   On: 08/27/2020 06:27   DG Chest Port 1 View  Result Date:  08/27/2020 CLINICAL DATA:  Initial evaluation for unsuccessful central line placement. EXAM: PORTABLE CHEST 1 VIEW COMPARISON:  Prior radiograph from 09/03/2020. FINDINGS: Cardiac and mediastinal silhouettes are stable, and remain within normal limits. Aortic atherosclerosis. Lungs mildly hypoinflated. Interval development of a moderate-sized  right-sided pneumothorax. No mediastinal shift. Veiling opacities overlying the bilateral hemidiaphragms consistent with pleural effusions. Superimposed patchy bibasilar opacities could reflect atelectasis or infiltrates, slightly worsened at the right lung base from previous. No acute osseous finding. IMPRESSION: 1. Interval development of moderate right-sided pneumothorax. No mediastinal shift. 2. Veiling opacities overlying the bilateral hemidiaphragms, consistent with bilateral pleural effusions. 3. Superimposed patchy bibasilar opacities, slightly worsened at the right lung base as compared to previous. Critical Value/emergent results were called by telephone at the time of interpretation on 08/27/2020 at 5:21 am to provider DAVID [facility] , who verbally acknowledged these results. Electronically Signed   By: Jeannine Boga M.D.   On: 08/27/2020 05:22   DG Chest Port 1 View  Result Date: 09/01/2020 CLINICAL DATA:  Weakness. EXAM: PORTABLE CHEST 1 VIEW COMPARISON:  08/10/2020 FINDINGS: The heart size is unremarkable. There are new moderate to large bilateral pleural effusions. There are hazy perihilar and bibasilar airspace opacities which are new from prior study. There is no pneumothorax. No acute osseous abnormality. Aortic calcifications are noted. IMPRESSION: 1. New moderate to large bilateral pleural effusions. 2. Worsening bibasilar airspace disease may represent consolidation or atelectasis. Electronically Signed   By: Constance Holster M.D.   On: 09/04/2020 17:17   DG Abd Portable 1V  Result Date: 08/30/2020 CLINICAL DATA:  Feeding tube placement EXAM: PORTABLE ABDOMEN-1 VIEW COMPARISON:  X-ray abdomen 07/07/2020, chest x-ray 08/29/2020 FINDINGS: Interval placement of enteric tube noted to course below the diaphragm with tip overlying the gastric lumen. Right pigtail catheter overlying the right mid hemithorax. Interval increase in size of a large right pneumothorax. Small left pleural  effusion. Severe coronary artery calcification. IMPRESSION: 1. Enteric tube coursing below the diaphragm with tip overlying the expected region of the gastric lumen. 2. Interval increase in size of a large pneumothorax (compared to chest x-ray 08/29/2020) with right chest tube pigtail catheter in similar position. 3. Small left pleural effusion. Electronically Signed   By: Iven Finn M.D.   On: 08/30/2020 19:32    ASSESSMENT AND PLAN: This is a very pleasant 79 year old white male with CLL currently on observation. The patient had a rising WBC close to 100,000 and worsening anemia due to his underlying CLL and received 3 cycles of bendamustine and Rituxan. He had a lot of difficulty tolerating this treatment and it was discontinued after 3 cycles. He has had multiple hospitalizations over the past few months. Currently admitted with septic shock, probable aspiration pneumonia, and pneumothorax. He has developed pancytopenia during his hospitalization. Labs from admission today have been reviewed and his white blood cell count and platelets were completely normal on admission. He had his baseline anemia on admission.  Chart has been reviewed and patient was discussed with Dr. Julien Nordmann. Pancytopenia likely due to bone marrow suppression from septic shock. It will take several weeks for counts to recover. For now, continue supportive care. Recommend PRBC transfusion for hemoglobin less than 7 and platelet transfusion for platelet count less than 20,000 or active bleeding. We do not plan to administer G-CSF. Recommend daily CBC with differential. If his counts do not start to recover soon, will reevaluate for possible bone marrow biopsy.   LOS: 9 days   Mikey Bussing, DNP, AGPCNP-BC,  AOCNP 09/05/20

## 2020-09-05 NOTE — Progress Notes (Signed)
PROGRESS NOTE    Tony Marshall  SWF:[id number] DOB: 1941-03-21 DOA: 08/29/2020 PCP: Timoteo Gaul, FNP    Chief Complaint  Patient presents with  . Hypotension  . Weakness    Brief Narrative:  37 yowm never smoker with history of HFrEF and CLL s/p three cycles chemo and rx avelox since 8/27    for PNA who presented with lethargy and poor po intake.  Found to have worsening bibasilar airspace disease and pleural effusions with hypotension despite 3.5L IVF. PCCM consulted for admission.  Labs were significant for WBC 3.3, Hgb 6.1 down from baseline of 9 several weeks ago, Lactic acid 3.2, Covid-19 negative.   Course complicated by right pneumothorax after right IJ attempt   Assessment & Plan:   Active Problems:   CLL (chronic lymphocytic leukemia) (HCC)   Acute respiratory failure (HCC)   Acute combined systolic and diastolic heart failure (HCC)   Shock circulatory (HCC)   Septic shock (HCC)   Iatrogenic pneumothorax on R    AKI (acute kidney injury) ([place])   Pressure injury of skin   Pneumothorax on right   Pneumonia due to infectious organism   Severe protein-calorie malnutrition (HCC)   Hypokalemia   Hypomagnesemia   Hypophosphatemia   Acute metabolic encephalopathy  1 septic shock secondary to pneumonia and hypovolemia due to poor oral intake, POA Improving.  Patient noted to have bout of hypotension the morning of 09/01/2020, which improved after a dose of IV albumin.  Chest x-ray done concerning for pneumonia.  Patient currently afebrile.  Patient has been off pressors since 08/30/2020 with some bouts of intermittent hypotension.  Status post IV cefepime x7 days.  Supportive care.  PCCM following.   2.  Pneumonia, possible aspiration pneumonia MRSA PCR negative.  Patient noted to have a history of Klebsiella in the sputum.  Patient seen by speech therapy who did not have any concerns for aspiration.  Afebrile.  Improved clinically.  Status post 7 days IV cefepime.   No further antibiotics needed at this time.  Follow.   3.  Right-sided pneumothorax Status post chest tube placement 08/27/2020.  Is noted per PCCM that tube was nonfunctional and replaced 08/31/2020 with reexpansion of the lung.  Still with air leak.  Patient noted the morning of 09/02/2020 with expanding pneumothorax per chest x-ray.  Patient assessed by critical care and tube noted to be nonfunctional and fixed per PCCM.  Per PCCM follow chest x-ray and if no improvement may need CT surgery to weigh in.  Per PCCM.   4.  Severe protein calorie malnutrition/FTT Dietitian following.  IV albumin every 6 hours x1 day was done.  Patient with poor appetite.  Patient placed back on home regimen of appetite stimulant of Marinol.  NG tube was dislodged overnight and as such no longer on tube feeds.  Palliative care following.   5.  Acute kidney injury/metabolic acidosis Acute kidney injury improved.  Patient with acidosis improved on bicarb drip.  Discontinued bicarb drip.  Follow.    6.  Well-controlled diabetes mellitus type 2 Hemoglobin A1c 5.8 (08/28/2020).  CBGs of 141 this morning.  Banded tube seems to have been dislodged overnight.  No longer on tube feeds.  TPatient noted to be on Tresiba 12 units daily, NovoLog 3 to 5 units 3 times daily, Actos 15 mg daily.  Patient currently with poor oral intake.  Continue Lantus 5 units daily.  Sliding scale insulin.  Diet has been liberalized to a regular diet.  Patient also placed back on home regimen of appetite stimulant of Marinol.  Follow.     7.  Hypophosphatemia/hypomagnesemia/hypokalemia Patient received potassium, magnesium, phosphorus supplementation.  Potassium currently at 3.7.  Phosphorus at 2.3.  Magnesium at 2.0.  We will give sodium phosphate 30 mmol IV x1.  Repeat labs in the morning.   8.  Delirium/acute encephalopathy In the setting of critical illness and altered sleep-wake cycle.  Clinical improvement.  More alert.  Avoid benzos.  Cervical  discontinued per PCCM recommendations.  Follow.   9.  CLL Status post 3 cycles chemotherapy.  Oncology following.  Outpatient follow-up with oncology.  10.  Pancytopenia Likely secondary to sepsis/illness leading to bone marrow suppression.  Patient with no overt bleeding.  Patient with history of CLL.  Oncology aware of admission.  CBC with white count of 1.1, hemoglobin of 9.6, platelet count of 35.  Patient with no bleeding. Transfusion threshold hemoglobin <7, platelet count < 10.  Oncology following.  Daily CBCs with differential.  If no significant improvement with counts oncology may consider bone marrow biopsy.  Follow.  11.  Hyponatremia Patient with a mild hyponatremia.  Likely secondary to poor oral intake as well as free water flushes..  Patient on tube feeds.  Changed free water flushes to every 6 hours.  Hyponatremia improving currently at 134.  12.  Pressure ulcer sacrum medial stage II, POA Continue current wound care. Pressure Injury 08/27/20 Sacrum Medial Stage 2 -  Partial thickness loss of dermis presenting as a shallow open injury with a red, pink wound bed without slough. Red area with small area of broken skin in center (Active)  08/27/20 1024  Location: Sacrum  Location Orientation: Medial  Staging: Stage 2 -  Partial thickness loss of dermis presenting as a shallow open injury with a red, pink wound bed without slough.  Wound Description (Comments): Red area with small area of broken skin in center  Present on Admission: Yes     Pressure Injury 08/27/20 Nose Left Deep Tissue Pressure Injury - Purple or maroon localized area of discolored intact skin or blood-filled blister due to damage of underlying soft tissue from pressure and/or shear. (Active)  08/27/20 1024  Location: Nose  Location Orientation: Left  Staging: Deep Tissue Pressure Injury - Purple or maroon localized area of discolored intact skin or blood-filled blister due to damage of underlying soft tissue  from pressure and/or shear.  Wound Description (Comments):   Present on Admission: Yes         DVT prophylaxis: SCDs Code Status: Partial Family Communication: No family at bedside.  Disposition:   Status is: Inpatient    Dispo: The patient is from: Home              Anticipated d/c is to: To be determined              Anticipated d/c date is: To be determined              Patient currently with a pneumothorax, chest tube in place. Not stable for discharge.       Consultants:   PCCM admission  Palliative care  Oncology  Procedures:   Chest x-ray 9/12 RIJ attempted, R pigtail chest tube by ED >> 9/16 removed since non functional 9/12 L Fem CVL >> 9/14 , pulled out 9/16 RT pigtail >>  Significant Hospital Events   9/11 Admit to PCCM 9/15 weaned off vasopressors ,increasing delirium, pulled out his central line  Antimicrobials:   IV cefepime 08/27/2020>>>>> 09/02/2020  IV Zosyn x1 dose 08/23/2020  IV vancomycin 08/19/2020>>>> 08/28/2020   Subjective: Patient laying in bed.  Plan to tube has been dislodged not sure exactly what happened but patient no longer has it.  Denies any chest pain or significant shortness of breath.  Poor appetite.  Patient denies any bleeding.  Objective: Vitals:   09/04/20 0401 09/04/20 1335 09/04/20 2010 09/05/20 0423  BP: 123/69 (!) 114/59 112/67 (!) 147/80  Pulse: 77 75 72 77  Resp: _0 Temp: 98.4 F (36.9 C) 98.8 F (37.1 C) 98.6 F (37 C) 100.2 F (37.9 C)  TempSrc: Oral Oral Oral Oral  SpO2: 99% 95% 96% 97%  Weight:    55.3 kg  Height:        Intake/Output Summary (Last 24 hours) at 09/05/2020 1134 Last data filed at 09/05/2020 0600 Gross per 24 hour  Intake 1685.91 ml  Output 875 ml  Net 810.91 ml   Filed Weights   09/02/20 0600 09/03/20 0500 09/05/20 0423  Weight: 55.3 kg 58 kg 55.3 kg    Examination:  General exam: NAD.  Alert.  Respiratory system: Clear to auscultation anterior lung  fields.  No wheezes, no crackles.  Fair air movement.   Cardiovascular system: RRR no murmurs rubs or gallops.  No JVD.  No lower extremity edema.  Right-sided chest tube in place with serous drainage noted.  Gastrointestinal system: Abdomen soft, nontender, nondistended, positive bowel sounds.  No rebound.  No guarding Central nervous system: Alert and oriented to self and place.  Moving extremities spontaneously.  No focal neurological deficits.   Extremities: Symmetric 5 x 5 power. Skin: No rashes, lesions or ulcers Psychiatry: Judgement and insight appear normal. Mood & affect appropriate.     Data Reviewed: I have personally reviewed following labs and imaging studies  CBC: Recent Labs  Lab 08/30/20 1230 08/30/20 1230 08/31/20 0243 09/01/20 0253 09/03/20 0658 09/04/20 0425 09/05/20 0456  WBC 3.5*   < > 4.9 2.9* 2.5* 1.7* 1.1*  NEUTROABS 2.3  --   --   --   --  0.8* 0.6*  HGB 10.9*   < > 12.8* 11.6* 10.5* 9.3* 9.6*  HCT 33.3*   < > 39.5 36.5* 29.8* 26.1* 28.7*  MCV 87.4   < > 88.4 90.3 82.1 81.1 85.7  PLT 58*   < > 66* 33* 38* 43* 35*   < > = values in this interval not displayed.    Basic Metabolic Panel: Recent Labs  Lab 09/01/20 0253 09/02/20 0838 09/03/20 0539 09/04/20 0425 09/05/20 0456  NA 134* 133* 131* 130* 134*  K 3.0* 3.7 4.8 4.4 3.7  CL 107 111 102 96* 96*  CO2 17* 14* 20* 25 29  GLUCOSE 243* 164* 152* 137* 169*  BUN 33* 26* 29* 27* 27*  CREATININE 0.86 0.67 0.69 0.57* 0.54*  CALCIUM 8.1* 7.7* 7.3* 7.2* 7.5*  MG 1.5* 1.6* 2.2 1.8 2.0  PHOS <1.0* 1.3* 2.0* 2.3* 2.3*    GFR: Estimated Creatinine Clearance: 61.4 mL/min (A) (by C-G formula based on SCr of 0.54 mg/dL (L)).  Liver Function Tests: Recent Labs  Lab 09/04/20 0425  AST 32  ALT 9  ALKPHOS 50  BILITOT 0.9  PROT 4.0*  ALBUMIN 2.3*    CBG: Recent Labs  Lab 09/04/20 2007 09/04/20 2356 09/05/20 0420 09/05/20 0746 09/05/20 1132  GLUCAP 149* 120* 161* 141* 104*     Recent  Results (from  the past 240 hour(s))  Culture, Urine     Status: Abnormal   Collection Time: 08/19/2020  3:49 PM   Specimen: Urine, Random  Result Value Ref Range Status   Specimen Description   Final    URINE, RANDOM Performed at [facility] [address] [place], [place] [address]    Special Requests   Final    NONE Performed at [facility], [place] [address], [place], [place] [address]    Culture (A)  Final    <10,000 COLONIES/mL INSIGNIFICANT GROWTH Performed at [facility] [address], [place], [place] [address]    Report Status 08/28/2020 FINAL  Final  SARS Coronavirus 2 by RT PCR (hospital order, performed in [facility] hospital lab) Nasopharyngeal Nasopharyngeal Swab     Status: None   Collection Time: 08/25/2020  4:10 PM   Specimen: Nasopharyngeal Swab  Result Value Ref Range Status   SARS Coronavirus 2 NEGATIVE NEGATIVE Final    Comment: (NOTE) SARS-CoV-2 target nucleic acids are NOT DETECTED.  The SARS-CoV-2 RNA is generally detectable in upper and lower respiratory specimens during the acute phase of infection. The lowest concentration of SARS-CoV-2 viral copies this assay can detect is 250 copies / mL. A negative result does not preclude SARS-CoV-2 infection and should not be used as the sole basis for treatment or other patient management decisions.  A negative result may occur with improper specimen collection / handling, submission of specimen other than nasopharyngeal swab, presence of viral mutation(s) within the areas targeted by this assay, and inadequate number of viral copies (<250 copies / mL). A negative result must be combined with clinical observations, patient history, and epidemiological information.  Fact Sheet for Patients:   StrictlyIdeas.no  Fact Sheet for Healthcare Providers: BankingDealers.co.za  This test is not yet approved or  cleared by the  [place] FDA and has been authorized for detection and/or diagnosis of SARS-CoV-2 by FDA under an Emergency Use Authorization (EUA).  This EUA will remain in effect (meaning this test can be used) for the duration of the COVID-19 declaration under Section 564(b)(1) of the Act, 21 U.S.C. section 360bbb-3(b)(1), unless the authorization is terminated or revoked sooner.  Performed at [facility], [place] [address], [place], [place] [address]   Expectorated sputum assessment w rflx to resp cult     Status: None   Collection Time: 08/27/20  7:17 AM   Specimen: Sputum  Result Value Ref Range Status   Specimen Description SPUTUM  Final   Special Requests NONE  Final   Sputum evaluation   Final    SPECIMEN MICROSCOPICALLY RESEMBLES SALIVA, NOT CULTURED Sputum specimen not acceptable for testing.  Please recollect.   Performed at [facility], [place] [address], [place], [place] [address]    Report Status 08/28/2020 FINAL  Final  MRSA PCR Screening     Status: None   Collection Time: 08/27/20 11:44 AM   Specimen: Nasopharyngeal  Result Value Ref Range Status   MRSA by PCR NEGATIVE NEGATIVE Final    Comment:        The GeneXpert MRSA Assay (FDA approved for NASAL specimens only), is one component of a comprehensive MRSA colonization surveillance program. It is not intended to diagnose MRSA infection nor to guide or monitor treatment for MRSA infections. Performed at [facility], [place] [address] [place], [place] [address]   Culture, blood (single)     Status: None  Collection Time: 08/27/20 12:12 PM   Specimen: Left Antecubital; Blood  Result Value Ref Range Status   Specimen Description   Final    LEFT ANTECUBITAL Performed at [facility] [address] [place], [place] [address]    Special Requests   Final    BOTTLES DRAWN AEROBIC ONLY Blood Culture results may not be optimal due to an inadequate  volume of blood received in culture bottles Performed at [facility] [address], [place], [place] [address]    Culture   Final    NO GROWTH 5 DAYS Performed at [facility], [place] [address], [place], [place] [address]    Report Status 09/01/2020 FINAL  Final         Radiology Studies: DG CHEST PORT 1 VIEW  Result Date: 09/05/2020 CLINICAL DATA:  Pneumothorax. EXAM: PORTABLE CHEST 1 VIEW COMPARISON:  09/04/2020. FINDINGS: Interim removal of feeding tube. Right chest tube in stable position. Stable small right apical pneumothorax. Stable cardiomegaly. Bibasilar atelectasis. Bilateral pulmonary infiltrates/edema. Slight progression from prior exam. Small bilateral pleural effusions again noted. IMPRESSION: 1. Interim removal of feeding tube. Right chest tube in stable position. Stable small right apical pneumothorax. 2.  Stable cardiomegaly. 3. Bibasilar atelectasis. Bilateral pulmonary infiltrates/edema again noted. Slight progression from prior exam. Small bilateral pleural effusions again noted. Electronically Signed   By: Marcello Moores  Register   On: 09/05/2020 05:36   DG CHEST PORT 1 VIEW  Result Date: 09/04/2020 CLINICAL DATA:  Shortness of breath, pneumothorax. EXAM: PORTABLE CHEST 1 VIEW COMPARISON:  Same day. FINDINGS: Stable cardiomediastinal silhouette. Right-sided chest tube is again noted. Small right apical pneumothorax is noted which is decreased compared to prior exam. Stable bibasilar opacities are noted with associated pleural effusions, left greater than right. Bony thorax is unremarkable. Feeding tube is seen entering stomach. IMPRESSION: Right-sided chest tube is again noted. Small right apical pneumothorax is noted which is decreased compared to prior exam. Stable bibasilar opacities are noted with associated pleural effusions, left greater than right. Electronically Signed   By: Marijo Conception M.D.   On: 09/04/2020 13:11   DG CHEST PORT 1  VIEW  Result Date: 09/04/2020 CLINICAL DATA:  Pneumothorax follow-up. EXAM: PORTABLE CHEST 1 VIEW COMPARISON:  09/03/2020 chest radiograph and prior. FINDINGS: Apically terminating right chest tube. There is a mild kink involving the proximal tubing. Enteric tube tip overlies the gastric body. Right pneumothorax is increased in size and with new inferior component. Bilateral interstitial and patchy airspace opacities are unchanged. No left pneumothorax. Small left pleural effusion, unchanged. Partially obscured cardiomediastinal silhouette. IMPRESSION: Increased size of right pneumothorax. Indwelling apically terminating right chest tube with mild proximal kinking. Bilateral pulmonary opacities and small left pleural effusion, unchanged. These results will be called to the ordering clinician or representative by the Radiologist Assistant, and communication documented in the PACS or [employer]. Electronically Signed   By: Primitivo Gauze M.D.   On: 09/04/2020 09:12        Scheduled Meds: . Chlorhexidine Gluconate Cloth  6 each Topical Daily  . dronabinol  2.5 mg Oral QAC lunch  . free water  200 mL Per Tube Q6H  . furosemide  20 mg Intravenous Q12H  . insulin aspart  0-15 Units Subcutaneous Q4H  . insulin glargine  5 Units Subcutaneous Daily  . mouth rinse  15 mL Mouth Rinse BID  . multivitamin with minerals  1 tablet Oral Daily  . pantoprazole  40 mg  Oral Daily  . sodium chloride flush  10 mL Intracatheter Q8H  . sodium chloride flush  10-40 mL Intracatheter Q12H   Continuous Infusions: . sodium chloride 10 mL/hr at 09/01/20 1800  . feeding supplement (GLUCERNA 1.5 CAL) 45 mL/hr at 09/04/20 1400  . sodium phosphate  Dextrose 5% IVPB 30 mmol (09/05/20 0943)     LOS: 9 days    Time spent: 40 minutes    Irine Seal, MD [facility]   To contact the attending provider between 7A-7P or the covering provider during after hours 7P-7A, please log into the web site  [email or web address] and access using universal [place] password for that web site. If you do not have the password, please call the hospital operator.  09/05/2020, 11:34 AM

## 2020-09-05 NOTE — Progress Notes (Signed)
The nurse called the patient daughter @[phone]  Renelda Loma on [phone] to update her about her father's condition. The nurse left voice message.

## 2020-09-06 ENCOUNTER — Inpatient Hospital Stay (HOSPITAL_COMMUNITY): Payer: Medicare HMO

## 2020-09-06 DIAGNOSIS — E876 Hypokalemia: Secondary | ICD-10-CM

## 2020-09-06 LAB — BASIC METABOLIC PANEL
Anion gap: 12 (ref 5–15)
BUN: 20 mg/dL (ref 8–23)
CO2: 30 mmol/L (ref 22–32)
Calcium: 7.6 mg/dL — ABNORMAL LOW (ref 8.9–10.3)
Chloride: 92 mmol/L — ABNORMAL LOW (ref 98–111)
Creatinine, Ser: 0.57 mg/dL — ABNORMAL LOW (ref 0.61–1.24)
GFR calc Af Amer: >60 mL/min (ref >60)
GFR calc non Af Amer: >60 mL/min (ref >60)
Glucose, Bld: 140 mg/dL — ABNORMAL HIGH (ref 70–99)
Potassium: 2.1 mmol/L — CL (ref 3.5–5.1)
Sodium: 134 mmol/L — ABNORMAL LOW (ref 135–145)

## 2020-09-06 LAB — MAGNESIUM: Magnesium: 1.5 mg/dL — ABNORMAL LOW (ref 1.7–2.4)

## 2020-09-06 LAB — CBC WITH DIFFERENTIAL/PLATELET
Abs Immature Granulocytes: 0.11 10*3/uL — ABNORMAL HIGH (ref 0.00–0.07)
Basophils Absolute: 0 10*3/uL (ref 0.0–0.1)
Basophils Relative: 0 %
Eosinophils Absolute: 0 10*3/uL (ref 0.0–0.5)
Eosinophils Relative: 0 %
HCT: 29.3 % — ABNORMAL LOW (ref 39.0–52.0)
Hemoglobin: 9.4 g/dL — ABNORMAL LOW (ref 13.0–17.0)
Immature Granulocytes: 13 %
Lymphocytes Relative: 31 %
Lymphs Abs: 0.3 10*3/uL — ABNORMAL LOW (ref 0.7–4.0)
MCH: 28.1 pg (ref 26.0–34.0)
MCHC: 32.1 g/dL (ref 30.0–36.0)
MCV: 87.7 fL (ref 80.0–100.0)
Monocytes Absolute: 0.1 10*3/uL (ref 0.1–1.0)
Monocytes Relative: 7 %
Neutro Abs: 0.4 10*3/uL — ABNORMAL LOW (ref 1.7–7.7)
Neutrophils Relative %: 49 %
Platelets: 52 10*3/uL — ABNORMAL LOW (ref 150–400)
RBC: 3.34 MIL/uL — ABNORMAL LOW (ref 4.22–5.81)
RDW: 21.5 % — ABNORMAL HIGH (ref 11.5–15.5)
WBC: 0.9 10*3/uL — CL (ref 4.0–10.5)
nRBC: 0 % (ref 0.0–0.2)

## 2020-09-06 LAB — PHOSPHORUS: Phosphorus: 2.4 mg/dL — ABNORMAL LOW (ref 2.5–4.6)

## 2020-09-06 LAB — GLUCOSE, CAPILLARY
Glucose-Capillary: 132 mg/dL — ABNORMAL HIGH (ref 70–99)
Glucose-Capillary: 136 mg/dL — ABNORMAL HIGH (ref 70–99)
Glucose-Capillary: 141 mg/dL — ABNORMAL HIGH (ref 70–99)
Glucose-Capillary: 167 mg/dL — ABNORMAL HIGH (ref 70–99)
Glucose-Capillary: 177 mg/dL — ABNORMAL HIGH (ref 70–99)
Glucose-Capillary: 200 mg/dL — ABNORMAL HIGH (ref 70–99)

## 2020-09-06 MED ORDER — POTASSIUM CHLORIDE CRYS ER 20 MEQ PO TBCR
40.0000 meq | EXTENDED_RELEASE_TABLET | Freq: Once | ORAL | Status: AC
  Administered 2020-09-06: 40 meq via ORAL
  Filled 2020-09-06: qty 2

## 2020-09-06 MED ORDER — POTASSIUM CHLORIDE CRYS ER 20 MEQ PO TBCR
40.0000 meq | EXTENDED_RELEASE_TABLET | ORAL | Status: AC
  Administered 2020-09-06 (×2): 40 meq via ORAL
  Filled 2020-09-06 (×2): qty 2

## 2020-09-06 MED ORDER — MAGNESIUM SULFATE 2 GM/50ML IV SOLN
2.0000 g | Freq: Once | INTRAVENOUS | Status: AC
  Administered 2020-09-06: 2 g via INTRAVENOUS
  Filled 2020-09-06: qty 50

## 2020-09-06 MED ORDER — POTASSIUM PHOSPHATES 15 MMOLE/5ML IV SOLN
12.0000 mmol | Freq: Once | INTRAVENOUS | Status: AC
  Administered 2020-09-06: 12 mmol via INTRAVENOUS
  Filled 2020-09-06: qty 4

## 2020-09-06 NOTE — Progress Notes (Signed)
CRITICAL VALUE ALERT  Critical Value:  Potassium 2.1  Date & Time Notied:  0706  Provider Notified: A Hongalgi  Orders Received/Actions taken: awaiting order

## 2020-09-06 NOTE — Progress Notes (Signed)
Chaplain encountered patient's 79 y.o. daughter Tony Marshall in hallway, appearing stressed and tearful.  She said she was waiting to speak to director. She was concerned that her father was not able to tend to his needs without more support. "He's not drinking like he should."  Amber stated she had always taken care of her diabetic father; that she had always actively managed his needs.  Amber said her mother was recently diagnosed with stage 2 cancer and was not able to also care for patient as she had. "She's in treatment and tired.  She comes when she can."  Pt's daughter said she was adopted when she was 23.  "I am a daddy's girl."  Her only sibling, a brother much older than she, passed away years ago.   Chaplain left patient's daughter so that she could further speak with the staff about her concerns.  She will continue to follow. Rev. Tamsen Snider Pager [phone]

## 2020-09-06 NOTE — Progress Notes (Signed)
[place] drainage system exchanged, container full. Pt tolerated well. SRP, RN

## 2020-09-06 NOTE — Progress Notes (Signed)
Physical Therapy Treatment Patient Details Name: Tony Marshall MRN: [id number] DOB: [birth year] Today's Date: 09/06/2020    History of Present Illness Pt is 79 yo male with PMH of HF, CLL s/p 3 cycles of chemo and radiation , DM, HTN, and afib.   Pt has been admitted with PNE, septic shock, pleural effusions, pneumothorax with chest tube placed 9/12 and replaced on 9/16 due to prior tube nonfunctional.    PT Comments    Pt in bed with spouse at bedside.  General Comments: pt alert and following commands but appears withdrawn, uninterested, quiet.  Spouse pleading with pt to "eat something" and stated "I want you to get stronger so you can home with me".  Pt cont to watch TV and did not respond.  Pt declined any attempt to move or even sit on side of bed.  "not now" repeated pt.  Pt only agreed to "some" LE TE's which required AAROM.  Reported to RN.   Follow Up Recommendations  SNF     Equipment Recommendations  Wheelchair cushion (measurements PT);Wheelchair (measurements PT)    Recommendations for Other Services       Precautions / Restrictions Precautions Precautions: Fall Precaution Comments: flexi seal    Mobility  Bed Mobility               General bed mobility comments: declined despite encouragement  Transfers                 General transfer comment: declined despite encouragement  Ambulation/Gait                 Stairs             Wheelchair Mobility    Modified Rankin (Stroke Patients Only)       Balance                                            Cognition Arousal/Alertness: Awake/alert Behavior During Therapy: Flat affect                                   General Comments: pt alert and following commands but appears withdrawn, uninterested, quiet      Exercises  10 reps AAROM HS and ABD/ADD     General Comments        Pertinent Vitals/Pain Pain Assessment: Faces Faces Pain  Scale: Hurts a little bit Pain Location: general Pain Descriptors / Indicators: Grimacing Pain Intervention(s): Monitored during session    Home Living                      Prior Function            PT Goals (current goals can now be found in the care plan section) Progress towards PT goals: Progressing toward goals    Frequency    Min 3X/week      PT Plan Current plan remains appropriate    Co-evaluation              AM-PAC PT "6 Clicks" Mobility   Outcome Measure  Help needed turning from your back to your side while in a flat bed without using bedrails?: Total Help needed moving from lying on your back to sitting on the side of a flat bed without  using bedrails?: Total Help needed moving to and from a bed to a chair (including a wheelchair)?: Total Help needed standing up from a chair using your arms (e.g., wheelchair or bedside chair)?: Total Help needed to walk in hospital room?: Total Help needed climbing 3-5 steps with a railing? : Total 6 Click Score: 6    End of Session   Activity Tolerance: Patient limited by fatigue Patient left: in bed;with call bell/phone within reach;with bed alarm set Nurse Communication: Mobility status PT Visit Diagnosis: Other abnormalities of gait and mobility (R26.89);Muscle weakness (generalized) (M62.81)     Time: 0459-9774 PT Time Calculation (min) (ACUTE ONLY): 13 min  Charges:  $Therapeutic Exercise: 8-22 mins                     Rica Koyanagi  PTA Acute  Rehabilitation Services Pager      [phone] Office      [phone]

## 2020-09-06 NOTE — Progress Notes (Signed)
    NAME:  Tony Marshall, MRN:  [id number], DOB:  Dec 22, 1940, LOS: 13 ADMISSION DATE:  08/25/2020, CONSULTATION DATE:  09/06/20 REFERRING MD:  EDP, CHIEF COMPLAINT:  lethargy  Brief History   78 yowm, never smoker, with history of HFrEF and CLL s/p three cycles chemo and rx avelox since 8/27 for PNA who presented with lethargy and poor PO intake.  Found to have worsening bibasilar airspace disease and pleural effusions with hypotension despite 3.5L IVF. PCCM consulted for admission. Labs were significant for WBC 3.3, Hgb 6.1 down from baseline of 9 several weeks ago, Lactic acid 3.2, Covid-19 negative. Course complicated by right pneumothorax after right IJ attempt.  Past Medical History  Atrial fibrillation not on anticoagulation HFrEF CLL s/p 3 cycles chemo DM HTN GERD OSA  Significant Hospital Events   9/11 Admit to PCCM 9/15 weaned off vasopressors ,increasing delirium, pulled out his central line  9/17 [facility], PCCM following for chest tube 9/20 Residual chest tube air leak 1/7  Consults:  None  Procedures:  9/12 RIJ attempted, R pigtail chest tube by ED >> 9/16 removed since non functional 9/12 L Fem CVL >> 9/14 , pulled out 9/16 RT pigtail >>  Significant Diagnostic Tests:     Micro Data:  Sars-Cov-2 PCR 9/11 > negative Sputum culture  9/12 >  Blood Cx  9/12 > negative  Urine Cx  9/12 > insig growth  Antimicrobials:  Zozyn 9/11 only Vancomycin 9/11 >> 9/13 Cefepime 9/12 >> 9/18   Interim history/subjective:  Pt lying in bed, denies discomfort, daughter bedside.  Objective   Blood pressure 123/72, pulse 89, temperature 99.1 F (37.3 C), temperature source Oral, resp. rate 18, height 5' 7.5" (1.715 m), weight 54.2 kg, SpO2 94 %.        Intake/Output Summary (Last 24 hours) at 09/06/2020 1233 Last data filed at 09/06/2020 0813 Gross per 24 hour  Intake 142.01 ml  Output 2840 ml  Net -2697.99 ml   Exam: General: pt lying in bed, appearing  frail, NAD HEENT: MM pink/moist, anicteric Neuro: Alert, oriented to self/place, intermittently confused/withdrawn, follows commands, PERRL CV: s1s2, RRR, no murmurs/rubs/gallops, +2 pulses radial/pedal Pulm: regular, unlabored, breath sounds BUL- clear with fine crackles in bilateral bases, R CT in place with continued air leak on -20 cm suction GI: soft, non tender, active bowel sounds Skin: no rashes/lesions noted Extremities: warm/dry, BUE +2 edema with weeping, BLE +3 edema noted  CXR 9/22 > Small residual R apical PTX unchanged, small bilateral pleural effusions L > R  Resolved Hospital Problems  AKI, metabolic acidosis without anion gap, very mild lactic acidosis:  Septic shock Pneumonia, possible aspiration pneumonia  Assessment & Plan:   79 year old man with history of HFrEF and CLL s/p three cycles chemo with chronic dysphagia presenting with septic shock, aspiration pneumonia, right pneumothorax after IJ attempt.  R  Pneumothorax S/p chest tube placement on 9/12 & replaced CT on 9/16  - CT found at -20 cm suction, continued air leak, connections secure/dsg intact - continue -20 cm suction overnight, CXR in AM- if PTX stable consider water seal - may need TCTS evaluation if not resolving but unsure of candidacy given overall debility  PCCM will continue to follow CT, defer to [facility] for other management  Best practice:  Per primary team    Domingo Pulse [place], AGACNP-BC [place]    Please see Amion for pager details.

## 2020-09-06 NOTE — Progress Notes (Signed)
PROGRESS NOTE   Tony Marshall  XVQ:[id number]    DOB: August 20, 1941    DOA: 08/19/2020  PCP: Timoteo Gaul, FNP   I have briefly reviewed patients previous medical records in [facility].  Chief Complaint  Patient presents with  . Hypotension  . Weakness    Brief Narrative:  79 year old male with PMH of chronic systolic CHF, CLL on chemotherapy presented with shock and pneumonia.  He developed large right-sided iatrogenic pneumothorax, now status post chest tube and PCCM continue to follow.   Assessment & Plan:  Active Problems:   CLL (chronic lymphocytic leukemia) (HCC)   Acute respiratory failure (HCC)   Other pancytopenia (HCC)   Acute combined systolic and diastolic heart failure (HCC)   Shock circulatory (HCC)   Septic shock (HCC)   Iatrogenic pneumothorax on R    AKI (acute kidney injury) ([place])   Pressure injury of skin   Pneumothorax on right   Pneumonia due to infectious organism   Severe protein-calorie malnutrition (HCC)   Hypokalemia   Hypomagnesemia   Hypophosphatemia   Acute metabolic encephalopathy   Septic shock secondary to pneumonia and hypovolemia from poor oral intake, POA: Shock resolved.  Has been off of vasopressors since 9/15.  Has completed 7 days of IV cefepime.  Possible aspiration pneumonia MRSA PCR negative.  Patient noted to have history of Klebsiella in sputum.  Speech therapy evaluated and did not have any concerns for aspiration.  S/p 7 days of IV cefepime.  No further antibiotics indicated at this time.  Monitor.  Iatrogenic right-sided pneumothorax PCCM continues to follow.  Has chest tube continue to suction and has persistent air leak.  PCCM considering placing to waterseal and repeating chest x-ray suction may be keeping the bronchopleural fistula open.  Given his age and frailty they indicate that he may not be a candidate for cardiothoracic surgery.  Acute kidney injury/metabolic acidosis: S/p bicarbonate drip.   Resolved.  Well-controlled DM 2: A1c 5.8 on 9/13.  Reasonable inpatient control on low-dose Lantus and SSI, continue without change.  Hypophosphatemia/hypomagnesemia/hypokalemia: Replacing aggressively and follow.  Acute metabolic encephalopathy Likely multifactorial.  Avoid sedatives.  Delirium precautions.  Seems to be alert, oriented x2 and responds appropriately to simple questions.  CLL: S/p 3 cycles chemotherapy.  Outpatient follow-up with oncology.  Pancytopenia: Likely due to sepsis/acute illness causing bone marrow suppression.  Hemoglobin stable.  Platelets better.  WBC still at 0.9.  No bleeding reported.  Transfuse if hemoglobin less than 7 or platelets less than 20K or bleeding.  Per oncology, pancytopenia may take several weeks to recover, continue supportive care, no plans to administer G-CSF.  If counts do not improve then will consider bone marrow biopsy.  Hyponatremia: Stable.  Pressure Injury 08/27/20 Sacrum Medial Stage 2 -  Partial thickness loss of dermis presenting as a shallow open injury with a red, pink wound bed without slough. Red area with small area of broken skin in center (Active)  08/27/20 1024  Location: Sacrum  Location Orientation: Medial  Staging: Stage 2 -  Partial thickness loss of dermis presenting as a shallow open injury with a red, pink wound bed without slough.  Wound Description (Comments): Red area with small area of broken skin in center  Present on Admission: Yes     Pressure Injury 08/27/20 Nose Left Deep Tissue Pressure Injury - Purple or maroon localized area of discolored intact skin or blood-filled blister due to damage of underlying soft tissue from pressure and/or shear. (  Active)  08/27/20 1024  Location: Nose  Location Orientation: Left  Staging: Deep Tissue Pressure Injury - Purple or maroon localized area of discolored intact skin or blood-filled blister due to damage of underlying soft tissue from pressure and/or shear.   Wound Description (Comments):   Present on Admission: Yes    Body mass index is 18.44 kg/m.  Nutritional Status Nutrition Problem: Severe Malnutrition Etiology: chronic illness, cancer and cancer related treatments Signs/Symptoms: moderate fat depletion, moderate muscle depletion, severe fat depletion, severe muscle depletion Interventions: Magic cup, Tube feeding, Other (Comment) ([employer]source TF)  Patient continues to have poor oral intake.  On PTA Marinol.  NG tube discontinued.  PMD following.  DVT prophylaxis: Place and maintain sequential compression device Start: 08/27/20 0918 SCDs Start: 08/27/20 0058     Code Status: Partial Code  Family Communication: None at bedside. Disposition:  Status is: Inpatient  Remains inpatient appropriate because:Inpatient level of care appropriate due to severity of illness   Dispo: The patient is from: Home              Anticipated d/c is to: TBD              Anticipated d/c date is: > 3 days              Patient currently is not medically stable to d/c.        Consultants:     Procedures:   9/12 RIJ attempted, R pigtail chest tube by ED >> 9/16 removed since non functional 9/12 L Fem CVL >> 9/14 , pulled out 9/16 RT pigtail >>   Antimicrobials:    Anti-infectives (From admission, onward)   Start     Dose/Rate Route Frequency Ordered Stop   08/27/20 2200  vancomycin (VANCOREADY) IVPB 500 mg/100 mL  Status:  Discontinued        500 mg 100 mL/hr over 60 Minutes Intravenous Every 24 hours 08/27/20 0136 08/28/20 1133   08/27/20 0200  ceFEPIme (MAXIPIME) 2 g in sodium chloride 0.9 % 100 mL IVPB        2 g 200 mL/hr over 30 Minutes Intravenous Every 12 hours 08/27/20 0136 09/02/20 1521   09/02/2020 1930  vancomycin (VANCOCIN) IVPB 1000 mg/200 mL premix        1,000 mg 200 mL/hr over 60 Minutes Intravenous  Once 09/11/2020 1927 09/09/2020 2305   08/27/2020 1930  piperacillin-tazobactam (ZOSYN) IVPB 3.375 g        3.375 g 100 mL/hr  over 30 Minutes Intravenous  Once 08/30/2020 1927 09/03/2020 2154        Subjective:  Patient alert and oriented x2.  Denies complaints.  No pain reported.  Asking if his chest tube will be coming out today.  No dyspnea reported.  As per RN, ongoing poor oral intake.  Objective:   Vitals:   09/05/20 1523 09/05/20 2049 09/06/20 0401 09/06/20 1407  BP: (!) 141/95 129/87 123/72 131/73  Pulse: 88 84 89 88  Resp: _0 Temp: 99.8 F (37.7 C) 99.7 F (37.6 C) 99.1 F (37.3 C) 98.8 F (37.1 C)  TempSrc: Oral Oral Oral Oral  SpO2: 91% 96% 94% 93%  Weight:   54.2 kg   Height:        General exam: Elderly male, moderately built and frail, chronically ill looking, lying comfortably propped up in bed without distress. Respiratory system: Diminished breath sounds in the right base but otherwise clear to auscultation without wheezing, crackles. Respiratory  effort normal. Cardiovascular system: S1 & S2 heard, RRR. No JVD, murmurs, rubs, gallops or clicks. No pedal edema.  Telemetry personally reviewed: Sinus rhythm. Gastrointestinal system: Abdomen is nondistended, soft and nontender. No organomegaly or masses felt. Normal bowel sounds heard. Central nervous system: Alert and oriented x2. No focal neurological deficits. Extremities: Symmetric 5 x 5 power. Skin: No rashes, lesions or ulcers Psychiatry: Judgement and insight appear impaired. Mood & affect flat.     Data Reviewed:   I have personally reviewed following labs and imaging studies   CBC: Recent Labs  Lab 09/04/20 0425 09/05/20 0456 09/06/20 0616  WBC 1.7* 1.1* 0.9*  NEUTROABS 0.8* 0.6* 0.4*  HGB 9.3* 9.6* 9.4*  HCT 26.1* 28.7* 29.3*  MCV 81.1 85.7 87.7  PLT 43* 35* 52*    Basic Metabolic Panel: Recent Labs  Lab 09/04/20 0425 09/05/20 0456 09/06/20 0616  NA 130* 134* 134*  K 4.4 3.7 2.1*  CL 96* 96* 92*  CO2 _0 GLUCOSE 137* 169* 140*  BUN 27* 27* 20  CREATININE 0.57* 0.54* 0.57*  CALCIUM 7.2*  7.5* 7.6*  MG 1.8 2.0 1.5*  PHOS 2.3* 2.3* 2.4*    Liver Function Tests: Recent Labs  Lab 09/04/20 0425  AST 32  ALT 9  ALKPHOS 50  BILITOT 0.9  PROT 4.0*  ALBUMIN 2.3*    CBG: Recent Labs  Lab 09/06/20 0802 09/06/20 1147 09/06/20 1706  GLUCAP 141* 177* 167*    Microbiology Studies:  No results found for this or any previous visit (from the past 240 hour(s)).   Radiology Studies:  DG CHEST PORT 1 VIEW  Result Date: 09/06/2020 CLINICAL DATA:  Pleural effusion EXAM: PORTABLE CHEST 1 VIEW COMPARISON:  09/05/2020 FINDINGS: Right chest tube remains in place. Small residual right apical pneumothorax, stable. Patchy bilateral airspace disease. Small bilateral pleural effusions. IMPRESSION: No significant change since prior study. Continued patchy bilateral airspace disease, left greater than right with small effusions. Stable right apical pneumothorax. Electronically Signed   By: Rolm Baptise M.D.   On: 09/06/2020 11:47   DG CHEST PORT 1 VIEW  Result Date: 09/05/2020 CLINICAL DATA:  Pneumothorax. EXAM: PORTABLE CHEST 1 VIEW COMPARISON:  09/04/2020. FINDINGS: Interim removal of feeding tube. Right chest tube in stable position. Stable small right apical pneumothorax. Stable cardiomegaly. Bibasilar atelectasis. Bilateral pulmonary infiltrates/edema. Slight progression from prior exam. Small bilateral pleural effusions again noted. IMPRESSION: 1. Interim removal of feeding tube. Right chest tube in stable position. Stable small right apical pneumothorax. 2.  Stable cardiomegaly. 3. Bibasilar atelectasis. Bilateral pulmonary infiltrates/edema again noted. Slight progression from prior exam. Small bilateral pleural effusions again noted. Electronically Signed   By: [place]   On: 09/05/2020 05:36     Scheduled Meds:   . Chlorhexidine Gluconate Cloth  6 each Topical Daily  . dronabinol  2.5 mg Oral QAC lunch  . free water  200 mL Per Tube Q6H  . furosemide  20 mg Intravenous  Q12H  . insulin aspart  0-15 Units Subcutaneous Q4H  . insulin glargine  5 Units Subcutaneous Daily  . mouth rinse  15 mL Mouth Rinse BID  . multivitamin with minerals  1 tablet Oral Daily  . pantoprazole  40 mg Oral Daily  . sodium chloride flush  10 mL Intracatheter Q8H  . sodium chloride flush  10-40 mL Intracatheter Q12H    Continuous Infusions:   . sodium chloride 10 mL/hr at 09/01/20 1800  . feeding supplement (GLUCERNA 1.5  CAL) 45 mL/hr at 09/04/20 1400     LOS: 10 days     Vernell Leep, MD, [place], [facility]. [facility]    To contact the attending provider between 7A-7P or the covering provider during after hours 7P-7A, please log into the web site [email or web address] and access using universal [place] password for that web site. If you do not have the password, please call the hospital operator.  09/06/2020, 5:30 PM

## 2020-09-06 NOTE — Plan of Care (Signed)
  Problem: Education: Goal: Knowledge of General Education information will improve Description: Including pain rating scale, medication(s)/side effects and non-pharmacologic comfort measures Outcome: Progressing   Problem: Health Behavior/Discharge Planning: Goal: Ability to manage health-related needs will improve Outcome: Progressing   Problem: Clinical Measurements: Goal: Will remain free from infection Outcome: Progressing   

## 2020-09-07 ENCOUNTER — Inpatient Hospital Stay (HOSPITAL_COMMUNITY): Payer: Medicare HMO

## 2020-09-07 DIAGNOSIS — R627 Adult failure to thrive: Secondary | ICD-10-CM

## 2020-09-07 LAB — GLUCOSE, CAPILLARY
Glucose-Capillary: 120 mg/dL — ABNORMAL HIGH (ref 70–99)
Glucose-Capillary: 120 mg/dL — ABNORMAL HIGH (ref 70–99)
Glucose-Capillary: 124 mg/dL — ABNORMAL HIGH (ref 70–99)
Glucose-Capillary: 154 mg/dL — ABNORMAL HIGH (ref 70–99)
Glucose-Capillary: 160 mg/dL — ABNORMAL HIGH (ref 70–99)
Glucose-Capillary: 69 mg/dL — ABNORMAL LOW (ref 70–99)

## 2020-09-07 LAB — CBC WITH DIFFERENTIAL/PLATELET
Abs Immature Granulocytes: 0.01 10*3/uL (ref 0.00–0.07)
Basophils Absolute: 0 10*3/uL (ref 0.0–0.1)
Basophils Relative: 0 %
Eosinophils Absolute: 0 10*3/uL (ref 0.0–0.5)
Eosinophils Relative: 0 %
HCT: 27.9 % — ABNORMAL LOW (ref 39.0–52.0)
Hemoglobin: 9.1 g/dL — ABNORMAL LOW (ref 13.0–17.0)
Immature Granulocytes: 2 %
Lymphocytes Relative: 51 %
Lymphs Abs: 0.3 10*3/uL — ABNORMAL LOW (ref 0.7–4.0)
MCH: 28.2 pg (ref 26.0–34.0)
MCHC: 32.6 g/dL (ref 30.0–36.0)
MCV: 86.4 fL (ref 80.0–100.0)
Monocytes Absolute: 0.1 10*3/uL (ref 0.1–1.0)
Monocytes Relative: 10 %
Neutro Abs: 0.2 10*3/uL — ABNORMAL LOW (ref 1.7–7.7)
Neutrophils Relative %: 37 %
Platelets: 77 10*3/uL — ABNORMAL LOW (ref 150–400)
RBC: 3.23 MIL/uL — ABNORMAL LOW (ref 4.22–5.81)
RDW: 21.2 % — ABNORMAL HIGH (ref 11.5–15.5)
WBC: 0.6 10*3/uL — CL (ref 4.0–10.5)
nRBC: 0 % (ref 0.0–0.2)

## 2020-09-07 LAB — RENAL FUNCTION PANEL
Albumin: 2.2 g/dL — ABNORMAL LOW (ref 3.5–5.0)
Anion gap: 11 (ref 5–15)
BUN: 20 mg/dL (ref 8–23)
CO2: 33 mmol/L — ABNORMAL HIGH (ref 22–32)
Calcium: 8.2 mg/dL — ABNORMAL LOW (ref 8.9–10.3)
Chloride: 92 mmol/L — ABNORMAL LOW (ref 98–111)
Creatinine, Ser: 0.64 mg/dL (ref 0.61–1.24)
GFR calc Af Amer: >60 mL/min (ref >60)
GFR calc non Af Amer: >60 mL/min (ref >60)
Glucose, Bld: 125 mg/dL — ABNORMAL HIGH (ref 70–99)
Phosphorus: 1.8 mg/dL — ABNORMAL LOW (ref 2.5–4.6)
Potassium: 3 mmol/L — ABNORMAL LOW (ref 3.5–5.1)
Sodium: 136 mmol/L (ref 135–145)

## 2020-09-07 LAB — MAGNESIUM: Magnesium: 1.7 mg/dL (ref 1.7–2.4)

## 2020-09-07 MED ORDER — POTASSIUM CHLORIDE CRYS ER 20 MEQ PO TBCR
40.0000 meq | EXTENDED_RELEASE_TABLET | Freq: Once | ORAL | Status: AC
  Administered 2020-09-07: 40 meq via ORAL
  Filled 2020-09-07: qty 2

## 2020-09-07 MED ORDER — SACCHAROMYCES BOULARDII 250 MG PO CAPS
250.0000 mg | ORAL_CAPSULE | Freq: Two times a day (BID) | ORAL | Status: DC
  Administered 2020-09-07 – 2020-09-11 (×6): 250 mg via ORAL
  Filled 2020-09-07 (×8): qty 1

## 2020-09-07 MED ORDER — POTASSIUM PHOSPHATES 15 MMOLE/5ML IV SOLN
20.0000 mmol | Freq: Once | INTRAVENOUS | Status: AC
  Administered 2020-09-07: 20 mmol via INTRAVENOUS
  Filled 2020-09-07: qty 6.67

## 2020-09-07 MED ORDER — MIRTAZAPINE 15 MG PO TABS
7.5000 mg | ORAL_TABLET | Freq: Every day | ORAL | Status: DC
  Administered 2020-09-07 – 2020-09-09 (×3): 7.5 mg via ORAL
  Filled 2020-09-07 (×3): qty 1

## 2020-09-07 NOTE — Progress Notes (Signed)
Occupational Therapy Progress Note  Therapy session very limited today. RN and OT providing encouragement to patient and DTR for patient to participate in exercises. OT instruct patient in gentle AA/AROM exercises to encourage functional reaching. Patient complete 3 reps each extremity with increased time, encouragement and cues. DTR Stating patient is confused today and asks patient if he wants to continue which he replies no. DTR ask OT to stop exercises at this time "He said no and I have to listen to him, he's my dad." OT gently try to discuss POC/goals for patient with DTR however she states her mom typically handles that. DTR states that ultimately they want to bring him home and are aware he will need 24/7 care "mom and I do that, we have our routine." Will continue to follow for POC/discharge needs and increase activity as patient able.     09/07/20 1100  OT Visit Information  Last OT Received On 09/07/20  Assistance Needed +2  History of Present Illness Pt is 79 yo male with PMH of HF, CLL s/p 3 cycles of chemo and radiation , DM, HTN, and afib.   Pt has been admitted with PNE, septic shock, pleural effusions, pneumothorax with chest tube placed 9/12 and replaced on 9/16 due to prior tube nonfunctional.  Precautions  Precautions Fall  Precaution Comments flexi seal, chest tube  Pain Assessment  Pain Assessment Faces  Faces Pain Scale 0  Cognition  Arousal/Alertness Awake/alert  Behavior During Therapy Flat affect  Overall Cognitive Status Impaired/Different from baseline  Area of Impairment Orientation;Following commands  Orientation Level Disoriented to;Place;Situation  Following Commands Follows one step commands with increased time  General Comments DTR reports patient more confused today spiked a fever  General Comments  General comments (skin integrity, edema, etc.) after DTR ask to stop bed exercises, OT gently try to discuss with DTR the overall goals for patient as currently  D/C recommendation is SNF. DTR appears very overwhelmed and states her mom (patient's spouse) trypically deals with all of that. DTR reports they want to take him home. OT state patient will need 24/7 care at home and DTR replies they spoke with palliative about this and DTR + spouse will provide the care he needs.   Exercises  Exercises Other exercises  Other Exercises  Other Exercises attempt gentle AA/AROM exercises semi supine in bed patient complete 3 reps forward reaching with max cues + encouragement before DTR asks patient "do you want to try exercises" when patient state no DTR asks therapist to stop "I think we need to stop for now"   OT - End of Session  Equipment Utilized During Treatment Oxygen  Activity Tolerance Patient limited by fatigue  Patient left in bed;with call bell/phone within reach;with family/visitor present;with nursing/sitter in room  Nurse Communication Other (comment) (RN present for session)  OT Assessment/Plan  OT Plan Discharge plan remains appropriate  OT Visit Diagnosis Unsteadiness on feet (R26.81);Other abnormalities of gait and mobility (R26.89);History of falling (Z91.81);Muscle weakness (generalized) (M62.81)  OT Frequency (ACUTE ONLY) Min 2X/week  Follow Up Recommendations SNF;Supervision/Assistance - 24 hour  OT Equipment None recommended by OT  AM-PAC OT "6 Clicks" Daily Activity Outcome Measure (Version 2)  Help from another person eating meals? 2  Help from another person taking care of personal grooming? 2  Help from another person toileting, which includes using toliet, bedpan, or urinal? 1  Help from another person bathing (including washing, rinsing, drying)? 1  Help from another person to put on  and taking off regular upper body clothing? 1  Help from another person to put on and taking off regular lower body clothing? 1  6 Click Score 8  OT Goal Progression  Progress towards OT goals Not progressing toward goals - comment (very limited  OOB and bed level activity from patient)  Acute Rehab OT Goals  Patient Stated Goal did not state  OT Goal Formulation With patient  Time For Goal Achievement 09/17/20  OT Time Calculation  OT Start Time (ACUTE ONLY) 0934  OT Stop Time (ACUTE ONLY) 0946  OT Time Calculation (min) 12 min  OT General Charges  $OT Visit 1 Visit  OT Treatments  $Therapeutic Activity 8-22 mins   Delbert Phenix OT OT pager: [phone]

## 2020-09-07 NOTE — Progress Notes (Signed)
Daily Progress Note   Patient Name: Tony Marshall       Date: 09/07/2020 DOB: September 09, 1941  Age: 79 y.o. MRN#: [id number] Attending Physician: Modena Jansky, MD Primary Care Physician: Timoteo Gaul, FNP Admit Date: 09/13/2020  Reason for Consultation/Follow-up: Establishing goals of care  Subjective: I met today with patient and his wife.  He slept throughout encounter.  His wife and I discussed clinical course as well as wishes moving forward in regard to care plan in light of his continued decline in nutrition, cognition, and function status in conjunction with recurrent infections.   We discussed difference between an aggressive medical intervention path and a palliative, comfort focused care path.  Values and goals of care important to patient and family were attempted to be elicited.  Concept of Hospice and Palliative Care were discussed  Questions and concerns addressed.   PMT will continue to support holistically.  Length of Stay: 11  Current Medications: Scheduled Meds:  . Chlorhexidine Gluconate Cloth  6 each Topical Daily  . insulin aspart  0-15 Units Subcutaneous Q4H  . insulin glargine  5 Units Subcutaneous Daily  . mouth rinse  15 mL Mouth Rinse BID  . mirtazapine  7.5 mg Oral QHS  . multivitamin with minerals  1 tablet Oral Daily  . pantoprazole  40 mg Oral Daily  . saccharomyces boulardii  250 mg Oral BID  . sodium chloride flush  10 mL Intracatheter Q8H  . sodium chloride flush  10-40 mL Intracatheter Q12H    Continuous Infusions: . sodium chloride 10 mL/hr at 09/01/20 1800  . feeding supplement (GLUCERNA 1.5 CAL) 45 mL/hr at 09/04/20 1400  . potassium PHOSPHATE IVPB (in mmol) 20 mmol (09/07/20 1654)    PRN Meds: sodium chloride, acetaminophen  (TYLENOL) oral liquid 160 mg/5 mL, benzonatate, docusate sodium, guaiFENesin-dextromethorphan, hydrALAZINE, polyethylene glycol, sodium chloride flush  Physical Exam         General:Somnolent, frail appearing HEENT: No bruits, no goiter, no JVD Heart:  No murmur appreciated. Lungs: Decreased air movement Abdomen: Soft, nontender, nondistended, positive bowel sounds.  Skin: Warm and dry  Vital Signs: BP 137/80 (BP Location: Right Arm)   Pulse 95   Temp 99 F (37.2 C)   Resp 18   Ht 5' 7.5" (1.715 m)   Wt 54.2 kg  SpO2 99%   BMI 18.44 kg/m  SpO2: SpO2: 99 % O2 Device: O2 Device: Room Air O2 Flow Rate: O2 Flow Rate (L/min): 1 L/min  Intake/output summary:   Intake/Output Summary (Last 24 hours) at 09/07/2020 2156 Last data filed at 09/07/2020 1700 Gross per 24 hour  Intake 389.73 ml  Output 1000 ml  Net -610.27 ml   LBM: Last BM Date: 09/05/20 Baseline Weight: Weight: 52.3 kg Most recent weight: Weight: 54.2 kg       Palliative Assessment/Data:    Flowsheet Rows     Most Recent Value  Intake Tab  Referral Department Hospitalist  Unit at Time of Referral Med/Surg Unit  Palliative Care Primary Diagnosis Other (Comment)  [FTT]  Date Notified 09/03/20  Palliative Care Type Return patient Palliative Care  Reason for referral Clarify Goals of Care  Date of Admission 08/19/2020  Date first seen by Palliative Care 09/04/20  # of days Palliative referral response time 1 Day(s)  # of days IP prior to Palliative referral 8  Clinical Assessment  Psychosocial & Spiritual Assessment  Palliative Care Outcomes      Patient Active Problem List   Diagnosis Date Noted  . Pneumothorax on right   . Pneumonia due to infectious organism   . Severe protein-calorie malnutrition ([place])   . Hypokalemia   . Hypomagnesemia   . Hypophosphatemia   . Acute metabolic encephalopathy   . Pressure injury of skin 08/30/2020  . Septic shock ([place]) 08/27/2020  . Iatrogenic pneumothorax on R   08/27/2020  . AKI (acute kidney injury) ([place]) 08/27/2020  . Hypotension due to hypovolemia   . Advanced care planning/counseling discussion   . Palliative care by specialist   . Cough   . DNR (do not resuscitate)   . Physical deconditioning 07/19/2020  . Stercoral ulcer of rectum   . Prolonged QT interval 07/03/2020  . Atrial fibrillation ([place])   . Shock circulatory ([place]) 07/01/2020  . Lower GI bleed   . Central venous catheter in place   . Fecal impaction ([place])   . Overflow diarrhea   . Rectal bleeding   . DKA (diabetic ketoacidoses) ([place]) 06/28/2020  . Systolic and diastolic CHF, chronic ([place]) 06/28/2020  . Anemia associated with acute blood loss   . Dehydration 06/27/2020  . Protein-calorie malnutrition, severe ([place]) 06/09/2020  . Hyperglycemia due to diabetes mellitus ([place])   . Other pancytopenia ([place])   . Acute combined systolic and diastolic heart failure ([place])   . Cardiogenic shock ([place])   . Altered mental status   . Failure to thrive in adult   . Symptomatic anemia   . Acute respiratory failure ([place])   . Increased anion gap metabolic acidosis 19/14/7829  . Goals of care, counseling/discussion 03/28/2020  . Encounter for antineoplastic chemotherapy 03/28/2020  . GERD (gastroesophageal reflux disease) 05/13/2014  . History of colonic polyps 05/13/2014  . Hiatal hernia 05/13/2014  . CLL (chronic lymphocytic leukemia) ([place]) 10/25/2013  . ED (erectile dysfunction) 10/20/2013  . Lymphocytosis 09/23/2013  . Essential hypertension 07/21/2013  . Pure hypercholesterolemia 07/21/2013  . Diabetes mellitus ([place]) 07/20/2013    Palliative Care Assessment & Plan   Recommendations/Plan:  Met with wife and discussed continued decline over the past few weeks to months.  Reviewed options for care moving forward.  She understands severity of his situation and talked with me today about consideration for hospice as he wants to be at home.  She would like to have a family meeting  tomorrow  with her daughter present as well to discuss care plan moving forward.  She is going to arrive at 0930 and is hoping to speak to other members of care team prior to meeting at 1300.    Code Status:    Code Status Orders  (From admission, onward)         Start     Ordered   08/27/20 0059  Limited resuscitation (code)  Continuous       Question Answer Comment  In the event of cardiac or respiratory ARREST: Initiate Code Blue, Call Rapid Response Yes   In the event of cardiac or respiratory ARREST: Perform CPR No   In the event of cardiac or respiratory ARREST: Perform Intubation/Mechanical Ventilation Yes   In the event of cardiac or respiratory ARREST: Use NIPPV/BiPAp only if indicated Yes   In the event of cardiac or respiratory ARREST: Administer ACLS medications if indicated No   In the event of cardiac or respiratory ARREST: Perform Defibrillation or Cardioversion if indicated No      08/27/20 0059        Code Status History    Date Active Date Inactive Code Status Order ID Comments User Context   07/20/2020 1532 07/21/2020 2120 DNR [id number]  Earlie Counts, NP Inpatient   07/19/2020 1949 07/20/2020 1532 Full Code [id number]  Clarnce Flock, MD ED   06/28/2020 1122 07/11/2020 1752 Full Code [id number]  Donne Hazel, MD ED   06/05/2020 1839 06/21/2020 0024 Full Code [id number]  Cherene Altes, MD ED   Advance Care Planning Activity       Prognosis:   < 6 months and he should qualify for home hospice services if so desired moving forward  Discharge Planning:  To Be Determined  Care plan was discussed with wife  Thank you for allowing the Palliative Medicine Team to assist in the care of this patient.   Total Time 40 minutes Prolonged Time Billed  no       Greater than 50%  of this time was spent counseling and coordinating care related to the above assessment and plan.  Micheline Rough, MD  Please contact Palliative Medicine Team phone at [phone] for  questions and concerns.

## 2020-09-07 NOTE — Progress Notes (Addendum)
    NAME:  Tony Marshall, MRN:  [id number], DOB:  1941/03/12, LOS: 40 ADMISSION DATE:  09/09/2020, CONSULTATION DATE:  09/07/20 REFERRING MD:  EDP, CHIEF COMPLAINT:  lethargy  Brief History   79 yowm, never smoker, with history of HFrEF and CLL s/p three cycles chemo and rx avelox since 8/27 for PNA who presented with lethargy and poor PO intake.  Found to have worsening bibasilar airspace disease and pleural effusions with hypotension despite 3.5L IVF. PCCM consulted for admission. Labs were significant for WBC 3.3, Hgb 6.1 down from baseline of 9 several weeks ago, Lactic acid 3.2, Covid-19 negative. Course complicated by right pneumothorax after right IJ attempt.  Past Medical History  Atrial fibrillation not on anticoagulation HFrEF CLL s/p 3 cycles chemo DM HTN GERD OSA  Significant Hospital Events   9/11 Admit to PCCM 9/15 weaned off vasopressors ,increasing delirium, pulled out his central line  9/17 transfer to [facility], PCCM following for chest tube 9/20 Residual chest tube air leak 1/7  Consults:  None  Procedures:  9/12 RIJ attempted, R pigtail chest tube by ED >> 9/16 removed since non functional 9/12 L Fem CVL >> 9/14 , pulled out 9/16 RT pigtail >>  Significant Diagnostic Tests:     Micro Data:  Sars-Cov-2 PCR 9/11 > negative Blood Cx  9/12 > negative Urine Cx  9/12 > insig growth  Antimicrobials:  Zozyn 9/11 only Vancomycin 9/11 >> 9/13 Cefepime 9/12 >> 9/18   Interim history/subjective:  Patient resting comfortably in bed, no complaints. Frequent dry cough.   Objective   Blood pressure 138/84, pulse 97, temperature 100.1 F (37.8 C), temperature source Oral, resp. rate 18, height 5' 7.5" (1.715 m), weight 54.2 kg, SpO2 97 %.        Intake/Output Summary (Last 24 hours) at 09/07/2020 0925 Last data filed at 09/07/2020 4008 Gross per 24 hour  Intake 246.94 ml  Output 1830 ml  Net -1583.06 ml   Exam:  General: frail elderly male in NAD HEENT:  [place]/AT, PERRL, no JVD Neuro: awake, alert, withdrawn  CV: RRR, no MRG Pulm: Bibasilar crackles. persistent 1/7 air leak.  GI: soft, non tender, active bowel sounds Skin: no rashes/lesions noted Extremities: warm/dry, BUE +2 edema with weeping, BLE +3 edema noted  CXR 9/23 > Small residual R apical PTX unchanged  Resolved Hospital Problems  AKI, metabolic acidosis without anion gap, very mild lactic acidosis:  Septic shock Pneumonia, possible aspiration pneumonia  Assessment & Plan:   79 year old man with history of HFrEF and CLL s/p three cycles chemo with chronic dysphagia presenting with septic shock, aspiration pneumonia, right pneumothorax after IJ attempt.   R  Pneumothorax iatrogenic S/p chest tube placement on 9/12 & replaced CT on 9/16  - PTX stable after decreased from 40cmH2O to 20cmH2O suction - decrease to 10cm H2O, repeat CXR this afternoon.  - Suspect he will need ongoing suction and potentially discharge planning with chest tube, daughter  mentioned possibly home with hospice care.    Georgann Housekeeper, AGACNP-BC [place]  See Amion for personal pager PCCM on call pager [phone]  09/07/2020 9:39 AM

## 2020-09-07 NOTE — Progress Notes (Signed)
Pt has poor appetite, made several attempts throughout the day to provide food and liquids to patient. Pt refuse to eat and push food and liquids away. Family have attempt to feed pt, again pt refuse to eat. Will cont to try to encourage, dietitian consult sent. SRP, RN

## 2020-09-07 NOTE — Progress Notes (Addendum)
PROGRESS NOTE   Tony Marshall  ZOX:[id number]    DOB: 29-Nov-1941    DOA: 09/04/2020  PCP: Timoteo Gaul, FNP   I have briefly reviewed patients previous medical records in [facility].  Chief Complaint  Patient presents with  . Hypotension  . Weakness    Brief Narrative:  79 year old male with PMH of chronic systolic CHF, CLL on chemotherapy presented with shock and pneumonia.  He developed large right-sided iatrogenic pneumothorax, now status post chest tube and PCCM continue to follow.   Assessment & Plan:  Active Problems:   CLL (chronic lymphocytic leukemia) (HCC)   Acute respiratory failure (HCC)   Other pancytopenia (HCC)   Acute combined systolic and diastolic heart failure (HCC)   Shock circulatory (HCC)   Septic shock (HCC)   Iatrogenic pneumothorax on R    AKI (acute kidney injury) ([place])   Pressure injury of skin   Pneumothorax on right   Pneumonia due to infectious organism   Severe protein-calorie malnutrition (HCC)   Hypokalemia   Hypomagnesemia   Hypophosphatemia   Acute metabolic encephalopathy   Septic shock secondary to pneumonia and hypovolemia from poor oral intake, POA: Shock resolved.  Has been off of vasopressors since 9/15.  Has completed 7 days of IV cefepime.  Possible aspiration pneumonia MRSA PCR negative.  Patient noted to have history of Klebsiella in sputum.  Speech therapy evaluated and did not have any concerns for aspiration.  S/p 7 days of IV cefepime.  No further antibiotics indicated at this time.  Monitor.  Iatrogenic right-sided pneumothorax PCCM continues to follow.  Has chest tube continue to suction and has persistent air leak.  PCCM will try changing chest tube to -10 cm water suction and follow chest x-ray.  Given his age and frailty they indicate that he may not be a candidate for cardiothoracic surgery.  Acute kidney injury/metabolic acidosis: S/p bicarbonate drip.  Resolved.  Well-controlled DM 2: A1c 5.8 on  9/13.  Reasonable inpatient control on low-dose Lantus and SSI, continue without change.  Hypophosphatemia/hypomagnesemia/hypokalemia: Replacing aggressively per pharmacy and follow.  Acute metabolic encephalopathy Likely multifactorial.  Avoid sedatives.  Delirium precautions.  Seems to be alert, oriented x2 and responds appropriately to simple questions.  CLL: S/p 3 cycles chemotherapy.  Outpatient follow-up with oncology.  Pancytopenia: Likely due to sepsis/acute illness causing bone marrow suppression.  Hemoglobin stable.  Platelets 77.  WBC still at 0.6/neutropenic.  No bleeding reported.  Transfuse if hemoglobin less than 7 or platelets less than 20K or bleeding.  Per oncology, pancytopenia may take several weeks to recover, continue supportive care, no plans to administer G-CSF.  If counts do not improve then will consider bone marrow biopsy.  I did discuss with oncology PA on 9/23.  Hyponatremia: Resolved.  Pressure Injury 08/27/20 Sacrum Medial Stage 2 -  Partial thickness loss of dermis presenting as a shallow open injury with a red, pink wound bed without slough. Red area with small area of broken skin in center (Active)  08/27/20 1024  Location: Sacrum  Location Orientation: Medial  Staging: Stage 2 -  Partial thickness loss of dermis presenting as a shallow open injury with a red, pink wound bed without slough.  Wound Description (Comments): Red area with small area of broken skin in center  Present on Admission: Yes     Pressure Injury 08/27/20 Nose Left Deep Tissue Pressure Injury - Purple or maroon localized area of discolored intact skin or blood-filled blister due to  damage of underlying soft tissue from pressure and/or shear. (Active)  08/27/20 1024  Location: Nose  Location Orientation: Left  Staging: Deep Tissue Pressure Injury - Purple or maroon localized area of discolored intact skin or blood-filled blister due to damage of underlying soft tissue from pressure  and/or shear.  Wound Description (Comments):   Present on Admission: Yes    Body mass index is 18.44 kg/m.  Nutritional Status Nutrition Problem: Severe Malnutrition Etiology: chronic illness, cancer and cancer related treatments Signs/Symptoms: moderate fat depletion, moderate muscle depletion, severe fat depletion, severe muscle depletion Interventions: Magic cup, Tube feeding, Other (Comment) ([employer])  Patient continues to have poor oral intake.  On PTA Marinol.  NG tube discontinued.  PMD following.  Diarrhea: Has rectal tube with liquid green stools.  Low index of suspicion for C. difficile.  May be related to recent infection and antibiotic exposure.  Trial of probiotics.  Adult failure to thrive: Multifactorial due to advanced age, CLL, multiple severe comorbidities and acute illness listed above.  Reportedly not eating well even prior to recurrence of CLL and even worse after chemotherapy initiation.  Daughter also thinks that patient may be quite depressed.  She brought up a medication that another provider had discussed which she said would "open up appetite and improve mood".  She states that Marinol is not really working.  DC'd Marinol.  Trial of Remeron.  Discussed regarding further [place] discussion and will reconsult palliative care.  Requested RN for calorie count.  DVT prophylaxis: Place and maintain sequential compression device Start: 08/27/20 0918 SCDs Start: 08/27/20 0058     Code Status: Partial Code  Family Communication: Discussed in detail with patient's daughter at bedside, updated care and answered questions. Disposition:  Status is: Inpatient  Remains inpatient appropriate because:Inpatient level of care appropriate due to severity of illness   Dispo: The patient is from: Home              Anticipated d/c is to: TBD              Anticipated d/c date is: > 3 days              Patient currently is not medically stable to d/c.        Consultants:       Procedures:   9/12 RIJ attempted, R pigtail chest tube by ED >> 9/16 removed since non functional 9/12 L Fem CVL >> 9/14 , pulled out 9/16 RT pigtail >>   Antimicrobials:    Anti-infectives (From admission, onward)   Start     Dose/Rate Route Frequency Ordered Stop   08/27/20 2200  vancomycin (VANCOREADY) IVPB 500 mg/100 mL  Status:  Discontinued        500 mg 100 mL/hr over 60 Minutes Intravenous Every 24 hours 08/27/20 0136 08/28/20 1133   08/27/20 0200  ceFEPIme (MAXIPIME) 2 g in sodium chloride 0.9 % 100 mL IVPB        2 g 200 mL/hr over 30 Minutes Intravenous Every 12 hours 08/27/20 0136 09/02/20 1521   09/01/2020 1930  vancomycin (VANCOCIN) IVPB 1000 mg/200 mL premix        1,000 mg 200 mL/hr over 60 Minutes Intravenous  Once 09/12/2020 1927 09/11/2020 2305   09/13/2020 1930  piperacillin-tazobactam (ZOSYN) IVPB 3.375 g        3.375 g 100 mL/hr over 30 Minutes Intravenous  Once 09/11/2020 1927 09/04/2020 2154        Subjective:  Patient  alert and oriented x2.  Poor historian.  Denies pain or complaints.  As per daughter, ongoing poor oral intake which has been an subacute on chronic issue.   Objective:   Vitals:   09/06/20 2001 09/07/20 0415 09/07/20 0900 09/07/20 1322  BP: 121/67 126/65 138/84 121/81  Pulse: 83 94 97 86  Resp: 16 18    Temp: 99.8 F (37.7 C) 99.6 F (37.6 C) 100.1 F (37.8 C) 99.1 F (37.3 C)  TempSrc: Oral Oral Oral Oral  SpO2: 93% 94% 97% 94%  Weight:      Height:        General exam: Elderly male, moderately built and frail, chronically ill looking, lying comfortably propped up in bed without distress. Respiratory system: Diminished breath sounds in the right base but otherwise clear to auscultation without wheezing, crackles. Respiratory effort normal.  Right-sided chest tube in place.  No significant change compared to yesterday. Cardiovascular system: S1 & S2 heard, RRR. No JVD, murmurs, rubs, gallops or clicks. No pedal edema.  Telemetry  personally reviewed: Sinus rhythm. Gastrointestinal system: Abdomen is nondistended, soft and nontender. No organomegaly or masses felt. Normal bowel sounds heard.  Has rectal tube with liquid green stools. Central nervous system: Alert and oriented x2. No focal neurological deficits. Extremities: Symmetric 5 x 5 power. Skin: No rashes, lesions or ulcers Psychiatry: Judgement and insight appear impaired. Mood & affect flat.     Data Reviewed:   I have personally reviewed following labs and imaging studies   CBC: Recent Labs  Lab 09/05/20 0456 09/06/20 0616 09/07/20 0747  WBC 1.1* 0.9* 0.6*  NEUTROABS 0.6* 0.4* 0.2*  HGB 9.6* 9.4* 9.1*  HCT 28.7* 29.3* 27.9*  MCV 85.7 87.7 86.4  PLT 35* 52* 77*    Basic Metabolic Panel: Recent Labs  Lab 09/05/20 0456 09/06/20 0616 09/07/20 0747  NA 134* 134* 136  K 3.7 2.1* 3.0*  CL 96* 92* 92*  CO2 29 30 33*  GLUCOSE 169* 140* 125*  BUN 27* 20 20  CREATININE 0.54* 0.57* 0.64  CALCIUM 7.5* 7.6* 8.2*  MG 2.0 1.5* 1.7  PHOS 2.3* 2.4* 1.8*    Liver Function Tests: Recent Labs  Lab 09/04/20 0425 09/07/20 0747  AST 32  --   ALT 9  --   ALKPHOS 50  --   BILITOT 0.9  --   PROT 4.0*  --   ALBUMIN 2.3* 2.2*    CBG: Recent Labs  Lab 09/07/20 0411 09/07/20 0731 09/07/20 1156  GLUCAP 160* 120* 154*    Microbiology Studies:  No results found for this or any previous visit (from the past 240 hour(s)).   Radiology Studies:  DG CHEST PORT 1 VIEW  Result Date: 09/07/2020 CLINICAL DATA:  Recent pneumothorax with chest tube in place EXAM: PORTABLE CHEST 1 VIEW COMPARISON:  September 07, 2020 study obtained earlier in the day. FINDINGS: Chest tube on the right is unchanged position with persistent small right apical pneumothorax. No tension component. There is a questionable small right pleural effusion, not seen earlier in the day. There is a pleural effusion on the left. There is airspace opacity in the right mid lung as well as  in the left perihilar and lower lobe regions. There is apparent scarring in the left upper lobe. Heart is mildly enlarged, stable, with pulmonary vascularity grossly stable. No adenopathy appreciable by radiography. No bone lesions. There is aortic atherosclerosis. IMPRESSION: Chest tube position on the right unchanged. Small right apical pneumothorax without tension  component. Areas of airspace opacity bilaterally. Suspect new small right pleural effusion. Stable pleural effusion on the left. Stable scarring left upper lobe. Aortic Atherosclerosis (ICD10-I70.0). Electronically Signed   By: Lowella Grip III M.D.   On: 09/07/2020 10:40   DG CHEST PORT 1 VIEW  Result Date: 09/07/2020 CLINICAL DATA:  Pneumothorax.  Chest tube. EXAM: PORTABLE CHEST 1 VIEW COMPARISON:  09/06/2020. FINDINGS: Right chest tube in stable position. Stable small right apical pneumothorax. Mediastinum is stable. Heart size stable. Bilateral multifocal pulmonary infiltrates again noted. Small left pleural effusion again noted. IMPRESSION: 1. Right chest tube in stable position. Small right apical pneumothorax again noted. 2. Multifocal bilateral pulmonary infiltrates again noted without interim change. Small left pleural effusion again noted. Electronically Signed   By: Marcello Moores  Register   On: 09/07/2020 06:12   DG CHEST PORT 1 VIEW  Result Date: 09/06/2020 CLINICAL DATA:  Pleural effusion EXAM: PORTABLE CHEST 1 VIEW COMPARISON:  09/05/2020 FINDINGS: Right chest tube remains in place. Small residual right apical pneumothorax, stable. Patchy bilateral airspace disease. Small bilateral pleural effusions. IMPRESSION: No significant change since prior study. Continued patchy bilateral airspace disease, left greater than right with small effusions. Stable right apical pneumothorax. Electronically Signed   By: Rolm Baptise M.D.   On: 09/06/2020 11:47     Scheduled Meds:   . Chlorhexidine Gluconate Cloth  6 each Topical Daily  .  dronabinol  2.5 mg Oral QAC lunch  . free water  200 mL Per Tube Q6H  . insulin aspart  0-15 Units Subcutaneous Q4H  . insulin glargine  5 Units Subcutaneous Daily  . mouth rinse  15 mL Mouth Rinse BID  . multivitamin with minerals  1 tablet Oral Daily  . pantoprazole  40 mg Oral Daily  . sodium chloride flush  10 mL Intracatheter Q8H  . sodium chloride flush  10-40 mL Intracatheter Q12H    Continuous Infusions:   . sodium chloride 10 mL/hr at 09/01/20 1800  . feeding supplement (GLUCERNA 1.5 CAL) 45 mL/hr at 09/04/20 1400  . potassium PHOSPHATE IVPB (in mmol)       LOS: 11 days     Vernell Leep, MD, [place], [facility]. [facility]    To contact the attending provider between 7A-7P or the covering provider during after hours 7P-7A, please log into the web site [email or web address] and access using universal [place] password for that web site. If you do not have the password, please call the hospital operator.  09/07/2020, 3:39 PM

## 2020-09-07 NOTE — Plan of Care (Signed)
  Problem: Education: Goal: Knowledge of General Education information will improve Description Including pain rating scale, medication(s)/side effects and non-pharmacologic comfort measures Outcome: Progressing   Problem: Health Behavior/Discharge Planning: Goal: Ability to manage health-related needs will improve Outcome: Progressing   

## 2020-09-07 NOTE — Care Management Important Message (Signed)
Important Message  Patient Details IM Letter given to the Patient Name: Tony Marshall MRN: [id number] Date of Birth: [birth year]   Medicare Important Message Given:  Yes     Kerin Salen 09/07/2020, 10:24 AM

## 2020-09-08 ENCOUNTER — Inpatient Hospital Stay (HOSPITAL_COMMUNITY): Payer: Medicare HMO

## 2020-09-08 DIAGNOSIS — R6520 Severe sepsis without septic shock: Secondary | ICD-10-CM

## 2020-09-08 LAB — CBC WITH DIFFERENTIAL/PLATELET
Abs Immature Granulocytes: 0.01 10*3/uL (ref 0.00–0.07)
Basophils Absolute: 0 10*3/uL (ref 0.0–0.1)
Basophils Relative: 0 %
Eosinophils Absolute: 0 10*3/uL (ref 0.0–0.5)
Eosinophils Relative: 0 %
HCT: 26.3 % — ABNORMAL LOW (ref 39.0–52.0)
Hemoglobin: 8.4 g/dL — ABNORMAL LOW (ref 13.0–17.0)
Immature Granulocytes: 3 %
Lymphocytes Relative: 61 %
Lymphs Abs: 0.2 10*3/uL — ABNORMAL LOW (ref 0.7–4.0)
MCH: 28.1 pg (ref 26.0–34.0)
MCHC: 31.9 g/dL (ref 30.0–36.0)
MCV: 88 fL (ref 80.0–100.0)
Monocytes Absolute: 0.1 10*3/uL (ref 0.1–1.0)
Monocytes Relative: 13 %
Neutro Abs: 0.1 10*3/uL — ABNORMAL LOW (ref 1.7–7.7)
Neutrophils Relative %: 23 %
Platelets: 82 10*3/uL — ABNORMAL LOW (ref 150–400)
RBC: 2.99 MIL/uL — ABNORMAL LOW (ref 4.22–5.81)
RDW: 21 % — ABNORMAL HIGH (ref 11.5–15.5)
WBC: 0.4 10*3/uL — CL (ref 4.0–10.5)
nRBC: 0 % (ref 0.0–0.2)

## 2020-09-08 LAB — LACTIC ACID, PLASMA
Lactic Acid, Venous: 1.6 mmol/L (ref 0.5–1.9)
Lactic Acid, Venous: 1.5 mmol/L (ref 0.5–1.9)

## 2020-09-08 LAB — RENAL FUNCTION PANEL
Albumin: 2.1 g/dL — ABNORMAL LOW (ref 3.5–5.0)
Anion gap: 12 (ref 5–15)
BUN: 20 mg/dL (ref 8–23)
CO2: 30 mmol/L (ref 22–32)
Calcium: 7.7 mg/dL — ABNORMAL LOW (ref 8.9–10.3)
Chloride: 93 mmol/L — ABNORMAL LOW (ref 98–111)
Creatinine, Ser: 0.58 mg/dL — ABNORMAL LOW (ref 0.61–1.24)
GFR calc Af Amer: >60 mL/min (ref >60)
GFR calc non Af Amer: >60 mL/min (ref >60)
Glucose, Bld: 99 mg/dL (ref 70–99)
Phosphorus: 3 mg/dL (ref 2.5–4.6)
Potassium: 3.2 mmol/L — ABNORMAL LOW (ref 3.5–5.1)
Sodium: 135 mmol/L (ref 135–145)

## 2020-09-08 LAB — URINALYSIS, ROUTINE W REFLEX MICROSCOPIC
Bilirubin Urine: NEGATIVE
Glucose, UA: 150 mg/dL — AB
Ketones, ur: NEGATIVE mg/dL
Leukocytes,Ua: NEGATIVE
Nitrite: NEGATIVE
Protein, ur: 100 mg/dL — AB
Specific Gravity, Urine: 1.023 (ref 1.005–1.030)
pH: 5 (ref 5.0–8.0)

## 2020-09-08 LAB — MAGNESIUM: Magnesium: 1.6 mg/dL — ABNORMAL LOW (ref 1.7–2.4)

## 2020-09-08 LAB — GLUCOSE, CAPILLARY
Glucose-Capillary: 116 mg/dL — ABNORMAL HIGH (ref 70–99)
Glucose-Capillary: 117 mg/dL — ABNORMAL HIGH (ref 70–99)
Glucose-Capillary: 125 mg/dL — ABNORMAL HIGH (ref 70–99)
Glucose-Capillary: 127 mg/dL — ABNORMAL HIGH (ref 70–99)
Glucose-Capillary: 142 mg/dL — ABNORMAL HIGH (ref 70–99)
Glucose-Capillary: 156 mg/dL — ABNORMAL HIGH (ref 70–99)
Glucose-Capillary: 80 mg/dL (ref 70–99)
Glucose-Capillary: 96 mg/dL (ref 70–99)
Glucose-Capillary: 96 mg/dL (ref 70–99)

## 2020-09-08 LAB — PROCALCITONIN: Procalcitonin: 3.48 ng/mL

## 2020-09-08 MED ORDER — POTASSIUM CHLORIDE CRYS ER 20 MEQ PO TBCR
40.0000 meq | EXTENDED_RELEASE_TABLET | ORAL | Status: DC
  Filled 2020-09-08: qty 2

## 2020-09-08 MED ORDER — VANCOMYCIN HCL IN DEXTROSE 1-5 GM/200ML-% IV SOLN
1000.0000 mg | Freq: Once | INTRAVENOUS | Status: AC
  Administered 2020-09-08: 1000 mg via INTRAVENOUS
  Filled 2020-09-08: qty 200

## 2020-09-08 MED ORDER — ACETAMINOPHEN 160 MG/5ML PO SOLN
650.0000 mg | ORAL | Status: DC | PRN
  Administered 2020-09-09: 650 mg via ORAL
  Filled 2020-09-08: qty 20.3

## 2020-09-08 MED ORDER — SODIUM CHLORIDE 0.9 % IV SOLN
2.0000 g | Freq: Three times a day (TID) | INTRAVENOUS | Status: DC
  Administered 2020-09-08 – 2020-09-09 (×2): 2 g via INTRAVENOUS
  Filled 2020-09-08 (×3): qty 2

## 2020-09-08 MED ORDER — ACETAMINOPHEN 10 MG/ML IV SOLN
1000.0000 mg | Freq: Four times a day (QID) | INTRAVENOUS | Status: DC | PRN
  Administered 2020-09-08: 1000 mg via INTRAVENOUS
  Filled 2020-09-08: qty 100

## 2020-09-08 MED ORDER — INSULIN ASPART 100 UNIT/ML ~~LOC~~ SOLN
0.0000 [IU] | Freq: Three times a day (TID) | SUBCUTANEOUS | Status: DC
  Administered 2020-09-08: 2 [IU] via SUBCUTANEOUS
  Administered 2020-09-09: 1 [IU] via SUBCUTANEOUS
  Administered 2020-09-09: 2 [IU] via SUBCUTANEOUS
  Administered 2020-09-10 (×2): 1 [IU] via SUBCUTANEOUS
  Administered 2020-09-10 – 2020-09-11 (×2): 2 [IU] via SUBCUTANEOUS
  Administered 2020-09-11: 1 [IU] via SUBCUTANEOUS
  Administered 2020-09-12: 2 [IU] via SUBCUTANEOUS

## 2020-09-08 MED ORDER — POTASSIUM CHLORIDE IN NACL 20-0.9 MEQ/L-% IV SOLN
INTRAVENOUS | Status: DC
  Filled 2020-09-08 (×2): qty 1000

## 2020-09-08 MED ORDER — SODIUM CHLORIDE 0.9 % IV BOLUS (SEPSIS)
250.0000 mL | Freq: Once | INTRAVENOUS | Status: AC
  Administered 2020-09-08: 250 mL via INTRAVENOUS

## 2020-09-08 MED ORDER — SODIUM CHLORIDE 0.9 % IV SOLN: INTRAVENOUS | Status: DC

## 2020-09-08 MED ORDER — MAGNESIUM SULFATE 4 GM/100ML IV SOLN
4.0000 g | Freq: Once | INTRAVENOUS | Status: AC
  Administered 2020-09-08: 4 g via INTRAVENOUS
  Filled 2020-09-08: qty 100

## 2020-09-08 MED ORDER — VANCOMYCIN HCL 500 MG/100ML IV SOLN
500.0000 mg | Freq: Two times a day (BID) | INTRAVENOUS | Status: DC
  Administered 2020-09-09 – 2020-09-10 (×3): 500 mg via INTRAVENOUS
  Filled 2020-09-08 (×3): qty 100

## 2020-09-08 MED ORDER — SODIUM CHLORIDE 0.9 % IV SOLN
2.0000 g | Freq: Once | INTRAVENOUS | Status: AC
  Administered 2020-09-08: 2 g via INTRAVENOUS
  Filled 2020-09-08: qty 2

## 2020-09-08 MED ORDER — DEXTROSE 50 % IV SOLN
INTRAVENOUS | Status: AC
  Administered 2020-09-08: 25 mL
  Filled 2020-09-08: qty 50

## 2020-09-08 MED ORDER — TBO-FILGRASTIM 300 MCG/0.5ML ~~LOC~~ SOSY
300.0000 ug | PREFILLED_SYRINGE | Freq: Every day | SUBCUTANEOUS | Status: AC
  Administered 2020-09-08 – 2020-09-09 (×2): 300 ug via SUBCUTANEOUS
  Filled 2020-09-08 (×2): qty 0.5

## 2020-09-08 MED ORDER — POTASSIUM CHLORIDE 10 MEQ/100ML IV SOLN
10.0000 meq | INTRAVENOUS | Status: AC
  Administered 2020-09-08 – 2020-09-09 (×6): 10 meq via INTRAVENOUS
  Filled 2020-09-08 (×6): qty 100

## 2020-09-08 NOTE — Progress Notes (Signed)
HEMATOLOGY-ONCOLOGY PROGRESS NOTE  SUBJECTIVE: More somnolent today.  Wife is at the bedside.  She states that he coughs immediately when taking in anything p.o.  Wife is considering taking him home with hospice.  She has a meeting with palliative care later today.  He has been having some low-grade fevers this morning.  Oncology History  CLL (chronic lymphocytic leukemia) ([place])  10/25/2013 Initial Diagnosis   CLL (chronic lymphocytic leukemia) ([place])   04/04/2020 -  Chemotherapy   The patient had palonosetron (ALOXI) injection 0.25 mg, 0.25 mg, Intravenous,  Once, 3 of 4 cycles Administration: 0.25 mg (04/04/2020), 0.25 mg (05/02/2020), 0.25 mg (05/30/2020) bendamustine (BENDEKA) 175 mg in sodium chloride 0.9 % 50 mL (3.0702 mg/mL) chemo infusion, 90 mg/m2 = 175 mg, Intravenous,  Once, 3 of 4 cycles Dose modification: 70 mg/m2 (original dose 90 mg/m2, Cycle 3, Reason: Provider Judgment, Comment: ANC ), 70 mg/m2 (original dose 90 mg/m2, Cycle 3, Reason: Other (see comments), Comment: low ANC) Administration: 175 mg (04/04/2020), 175 mg (04/05/2020), 150 mg (05/02/2020), 150 mg (05/03/2020), 125 mg (05/30/2020), 125 mg (05/31/2020) riTUXimab-pvvr (RUXIENCE) 700 mg in sodium chloride 0.9 % 250 mL (2.1875 mg/mL) infusion, 375 mg/m2 = 700 mg, Intravenous,  Once, 2 of 2 cycles Administration: 700 mg (04/04/2020)  for chemotherapy treatment.       REVIEW OF SYSTEMS:   Constitutional: Low-grade fever noted, no chills, poor appetite Eyes: Denies blurriness of vision Ears, nose, mouth, throat, and face: Denies mucositis or sore throat Respiratory: Noted to have cough Cardiovascular: Denies palpitation, chest discomfort Gastrointestinal:  Denies nausea, heartburn or change in bowel habits Skin: Bruises easily Neurological:Denies numbness, tingling or new weaknesses Behavioral/Psych: Mood is stable, no new changes  Extremities: No lower extremity edema All other systems were reviewed with the patient and  are negative.  I have reviewed the past medical history, past surgical history, social history and family history with the patient and they are unchanged from previous note.   PHYSICAL EXAMINATION: ECOG PERFORMANCE STATUS: 3 - Symptomatic, >50% confined to bed  Vitals:   09/08/20 1210 09/08/20 1214  BP:    Pulse:    Resp:    Temp:    SpO2: (!) 86% 92%   Filed Weights   09/03/20 0500 09/05/20 0423 09/06/20 0401  Weight: 58 kg 55.3 kg 54.2 kg    Intake/Output from previous day: 09/23 0701 - 09/24 0700 In: 389.7 [P.O.:120; I.V.:252; IV Piggyback:7.7] Out: 750 [Urine:550; Stool:50; Chest Tube:150]  GENERAL: Chronically ill-appearing, cachectic, no distress SKIN: No petechiae, dry scab at left nares NEURO: Alert, does not answer questions today  LABORATORY DATA:  I have reviewed the data as listed CMP Latest Ref Rng & Units 09/08/2020 09/07/2020 09/06/2020  Glucose 70 - 99 mg/dL 99 125(H) 140(H)  BUN 8 - 23 mg/dL 20 20 20   Creatinine 0.61 - 1.24 mg/dL 0.58(L) 0.64 0.57(L)  Sodium 135 - 145 mmol/L 135 136 134(L)  Potassium 3.5 - 5.1 mmol/L 3.2(L) 3.0(L) 2.1(LL)  Chloride 98 - 111 mmol/L 93(L) 92(L) 92(L)  CO2 22 - 32 mmol/L 30 33(H) 30  Calcium 8.9 - 10.3 mg/dL 7.7(L) 8.2(L) 7.6(L)  Total Protein 6.5 - 8.1 g/dL - - -  Total Bilirubin 0.3 - 1.2 mg/dL - - -  Alkaline Phos 38 - 126 U/L - - -  AST 15 - 41 U/L - - -  ALT 0 - 44 U/L - - -    Lab Results  Component Value Date   WBC 0.4 (LL) 09/08/2020  HGB 8.4 (L) 09/08/2020   HCT 26.3 (L) 09/08/2020   MCV 88.0 09/08/2020   PLT 82 (L) 09/08/2020   NEUTROABS 0.1 (L) 09/08/2020    DG Chest 2 View  Result Date: 08/10/2020 CLINICAL DATA:  79 year old male with history of productive cough for the past 3-4 weeks. CHF. Shortness of breath. EXAM: CHEST - 2 VIEW COMPARISON:  Chest x-ray 07/19/2020. FINDINGS: Airspace consolidation and volume loss in the left lower lobe concerning for pneumonia. Small left pleural effusion. Small  bilateral pleural effusions. No evidence of pulmonary edema. No pneumothorax. No suspicious appearing pulmonary nodules or masses are noted. Heart size is normal. Mitral annular calcifications. Upper mediastinal contours are within normal limits. Aortic atherosclerosis. IMPRESSION: 1. Combination of airspace consolidation and atelectasis in the left lower lobe indicative of pneumonia. 2. Small bilateral pleural effusions. 3. Aortic atherosclerosis. Electronically Signed   By: Vinnie Langton M.D.   On: 08/10/2020 16:37   DG Abd 1 View  Result Date: 08/31/2020 CLINICAL DATA:  Encounter for feeding tube placement EXAM: ABDOMEN - 1 VIEW COMPARISON:  Abdominal radiograph of 08/30/2020 FINDINGS: RIGHT-sided chest tube remains in place. Small amount of subcutaneous emphysema. Additional chest tube has been placed since previous chest radiograph. Tip of this chest tube cannot be seen, better seen on subsequent chest radiograph. The RIGHT hemidiaphragm no longer shows depression and lucency that was noted on the previous exam. There is RIGHT and LEFT basilar airspace disease likely with LEFT-sided pleural fluid layering dependently. IMPRESSION: 1. Interval improvement with respect to basilar portion of pneumothorax seen on previous imaging following placement of a second chest tube. 2. Effusion and basilar airspace disease on the LEFT. 3. Feeding tube in the mid stomach. Electronically Signed   By: Zetta Bills M.D.   On: 08/31/2020 11:26   DG Abd 1 View  Result Date: 08/30/2020 CLINICAL DATA:  Nasogastric tube placement EXAM: ABDOMEN - 1 VIEW COMPARISON:  5:46 p.m. FINDINGS: Nasoenteric feeding tube has been partially withdrawn and its tip is now seen just beyond the expected gastroesophageal junction. Visualized abdominal gas pattern is unremarkable. Small left pleural effusion is present. Right basilar pigtail chest tube is unchanged in position. There is depression of the right hemidiaphragm in keeping with  the presence of a large right pneumothorax, however, the degree of diaphragmatic depression and right lung hyperinflation has improved when compared to prior examination suggesting at least partial decompression through the chest tube. IMPRESSION: 1. Nasoenteric feeding tube has been partially withdrawn and its tip is now seen just beyond the expected gastroesophageal junction. Advancement of the catheter by at least 20 cm may more optimally position the catheter within the duodenum. 2. Improved right-sided pneumothorax, however with persistent diaphragmatic depression and right lung hyperinflation. Correlation for appropriate function of the chest tube is recommended. Electronically Signed   By: Fidela Salisbury MD   On: 08/30/2020 21:47   DG Chest Port 1 View  Result Date: 09/08/2020 CLINICAL DATA:  Pneumothorax. EXAM: PORTABLE CHEST 1 VIEW COMPARISON:  09/07/2020. FINDINGS: Right chest tube in stable position. Stable small right pneumothorax. Heart size normal. Multifocal bilateral pulmonary infiltrates are again noted. Small bilateral pleural effusions again noted. Degenerative change thoracic spine. IMPRESSION: 1. Right chest tube in stable position. Small right apical pneumothorax again noted without interim change. 2. Multifocal bilateral pulmonary infiltrates are again noted. Similar findings on prior exam. Small bilateral pleural effusions again noted. Electronically Signed   By: Marcello Moores  Register   On: 09/08/2020 07:47   DG  CHEST PORT 1 VIEW  Result Date: 09/07/2020 CLINICAL DATA:  Recent pneumothorax with chest tube in place EXAM: PORTABLE CHEST 1 VIEW COMPARISON:  September 07, 2020 study obtained earlier in the day. FINDINGS: Chest tube on the right is unchanged position with persistent small right apical pneumothorax. No tension component. There is a questionable small right pleural effusion, not seen earlier in the day. There is a pleural effusion on the left. There is airspace opacity in the  right mid lung as well as in the left perihilar and lower lobe regions. There is apparent scarring in the left upper lobe. Heart is mildly enlarged, stable, with pulmonary vascularity grossly stable. No adenopathy appreciable by radiography. No bone lesions. There is aortic atherosclerosis. IMPRESSION: Chest tube position on the right unchanged. Small right apical pneumothorax without tension component. Areas of airspace opacity bilaterally. Suspect new small right pleural effusion. Stable pleural effusion on the left. Stable scarring left upper lobe. Aortic Atherosclerosis (ICD10-I70.0). Electronically Signed   By: Lowella Grip III M.D.   On: 09/07/2020 10:40   DG CHEST PORT 1 VIEW  Result Date: 09/07/2020 CLINICAL DATA:  Pneumothorax.  Chest tube. EXAM: PORTABLE CHEST 1 VIEW COMPARISON:  09/06/2020. FINDINGS: Right chest tube in stable position. Stable small right apical pneumothorax. Mediastinum is stable. Heart size stable. Bilateral multifocal pulmonary infiltrates again noted. Small left pleural effusion again noted. IMPRESSION: 1. Right chest tube in stable position. Small right apical pneumothorax again noted. 2. Multifocal bilateral pulmonary infiltrates again noted without interim change. Small left pleural effusion again noted. Electronically Signed   By: Marcello Moores  Register   On: 09/07/2020 06:12   DG CHEST PORT 1 VIEW  Result Date: 09/06/2020 CLINICAL DATA:  Pleural effusion EXAM: PORTABLE CHEST 1 VIEW COMPARISON:  09/05/2020 FINDINGS: Right chest tube remains in place. Small residual right apical pneumothorax, stable. Patchy bilateral airspace disease. Small bilateral pleural effusions. IMPRESSION: No significant change since prior study. Continued patchy bilateral airspace disease, left greater than right with small effusions. Stable right apical pneumothorax. Electronically Signed   By: Rolm Baptise M.D.   On: 09/06/2020 11:47   DG CHEST PORT 1 VIEW  Result Date: 09/05/2020 CLINICAL  DATA:  Pneumothorax. EXAM: PORTABLE CHEST 1 VIEW COMPARISON:  09/04/2020. FINDINGS: Interim removal of feeding tube. Right chest tube in stable position. Stable small right apical pneumothorax. Stable cardiomegaly. Bibasilar atelectasis. Bilateral pulmonary infiltrates/edema. Slight progression from prior exam. Small bilateral pleural effusions again noted. IMPRESSION: 1. Interim removal of feeding tube. Right chest tube in stable position. Stable small right apical pneumothorax. 2.  Stable cardiomegaly. 3. Bibasilar atelectasis. Bilateral pulmonary infiltrates/edema again noted. Slight progression from prior exam. Small bilateral pleural effusions again noted. Electronically Signed   By: Marcello Moores  Register   On: 09/05/2020 05:36   DG CHEST PORT 1 VIEW  Result Date: 09/04/2020 CLINICAL DATA:  Shortness of breath, pneumothorax. EXAM: PORTABLE CHEST 1 VIEW COMPARISON:  Same day. FINDINGS: Stable cardiomediastinal silhouette. Right-sided chest tube is again noted. Small right apical pneumothorax is noted which is decreased compared to prior exam. Stable bibasilar opacities are noted with associated pleural effusions, left greater than right. Bony thorax is unremarkable. Feeding tube is seen entering stomach. IMPRESSION: Right-sided chest tube is again noted. Small right apical pneumothorax is noted which is decreased compared to prior exam. Stable bibasilar opacities are noted with associated pleural effusions, left greater than right. Electronically Signed   By: Marijo Conception M.D.   On: 09/04/2020 13:11   DG CHEST  PORT 1 VIEW  Result Date: 09/04/2020 CLINICAL DATA:  Pneumothorax follow-up. EXAM: PORTABLE CHEST 1 VIEW COMPARISON:  09/03/2020 chest radiograph and prior. FINDINGS: Apically terminating right chest tube. There is a mild kink involving the proximal tubing. Enteric tube tip overlies the gastric body. Right pneumothorax is increased in size and with new inferior component. Bilateral interstitial and  patchy airspace opacities are unchanged. No left pneumothorax. Small left pleural effusion, unchanged. Partially obscured cardiomediastinal silhouette. IMPRESSION: Increased size of right pneumothorax. Indwelling apically terminating right chest tube with mild proximal kinking. Bilateral pulmonary opacities and small left pleural effusion, unchanged. These results will be called to the ordering clinician or representative by the Radiologist Assistant, and communication documented in the PACS or [employer]. Electronically Signed   By: Primitivo Gauze M.D.   On: 09/04/2020 09:12   DG CHEST PORT 1 VIEW  Result Date: 09/03/2020 CLINICAL DATA:  Follow-up pneumothorax. EXAM: PORTABLE CHEST 1 VIEW COMPARISON:  09/02/2020 FINDINGS: Stable position of right-sided chest tube. Right-sided pneumothorax is again identified and appears similar in volume to the previous exam predominantly overlying the medial right upper lobe and right apex. Unchanged appearance of bilateral pleural effusions. Bilateral interstitial and airspace opacities are also unchanged. IMPRESSION: 1. No change in right-sided pneumothorax. 2. No change in bilateral interstitial and airspace opacities. 3. Stable bilateral pleural effusions. Electronically Signed   By: Kerby Moors M.D.   On: 09/03/2020 08:17   DG CHEST PORT 1 VIEW  Result Date: 09/02/2020 CLINICAL DATA:  Follow-up pneumothorax right side. EXAM: PORTABLE CHEST 1 VIEW COMPARISON:  09/02/2020 FINDINGS: Enteric tube unchanged coursing into the region of the stomach and off the film as tip is not visualized. Right-sided pigtail pleural drainage catheter unchanged. Previously seen right-sided pneumothorax is not visualized on the current exam. Mild interval improvement hazy opacification over the left perihilar region/left base. Persistent hazy density over the medial right base unchanged. Cardiomediastinal silhouette and remainder of the exam is unchanged. IMPRESSION: 1.  Interval improvement in hazy opacification over the left perihilar region/left base. Stable hazy density medial right base. 2. Right-sided pigtail pleural drainage catheter unchanged. Previously seen right-sided pneumothorax no longer visualized. Electronically Signed   By: Marin Olp M.D.   On: 09/02/2020 11:31   DG Chest Port 1 View  Result Date: 09/02/2020 CLINICAL DATA:  Follow-up pneumothorax. EXAM: PORTABLE CHEST 1 VIEW COMPARISON:  09/01/2020 FINDINGS: Right-sided chest tube is in place. The pigtail overlies the right upper lobe. Recurrent small to moderate right-sided pneumothorax is identified. Persistent opacification within the right mid and lower lung. Diffuse opacification of the left lung, unchanged. Small pleural effusions, stable. IMPRESSION: 1. Recurrent small to moderate right-sided pneumothorax. 2. No change in aeration to the lungs compared with previous exam. 3. Critical Value/emergent results were called by telephone at the time of interpretation on 09/02/2020 at 6:57 am to provider Nurse Karrie Doffing, who verbally acknowledged these results. Electronically Signed   By: Kerby Moors M.D.   On: 09/02/2020 06:57   DG Chest Port 1 View  Result Date: 09/01/2020 CLINICAL DATA:  Pneumothorax EXAM: PORTABLE CHEST 1 VIEW COMPARISON:  08/31/2020 FINDINGS: Interval removal of 1 of the 2 right chest tubes. No visible pneumothorax. Airspace disease throughout the left lung with possible layering left effusion, unchanged. Heart is borderline in size. IMPRESSION: Interval removal of 1 of the 2 right chest tubes without visible pneumothorax. Stable hazy opacity throughout the left lung, likely layering effusion and airspace disease. Electronically Signed   By: Lennette Bihari  [place] M.D.   On: 09/01/2020 04:46   DG CHEST PORT 1 VIEW  Result Date: 08/31/2020 CLINICAL DATA:  Encounter for chest tube placement EXAM: PORTABLE CHEST 1 VIEW COMPARISON:  Earlier today FINDINGS: A new chest tube has been  placed with tip at the apex. The right base tube is in stable position. Significant evacuation of right pneumothorax with no definite residual seen. Hazy opacification of the left chest from pleural fluid and pulmonary density. Feeding tube has been placed with indwelling wire and weight over the stomach. Stable heart size. IMPRESSION: 1. Additional right chest tube with evacuated pneumothorax. 2. Continued hazy opacification of the left chest, likely both pleural fluid and pulmonary opacity. Electronically Signed   By: Monte Fantasia M.D.   On: 08/31/2020 11:23   DG CHEST PORT 1 VIEW  Result Date: 08/31/2020 CLINICAL DATA:  Pneumothorax EXAM: PORTABLE CHEST 1 VIEW COMPARISON:  August 31, 2020 FINDINGS: The cardiomediastinal silhouette is unchanged in contour.Atherosclerotic calcifications of the aorta. Small LEFT pleural effusion. Persistent moderate RIGHT pneumothorax, minimally decreased in comparison to prior. Persistent opacification of the RIGHT lung along the RIGHT lower hilar border. Mildly improved aeration of the LEFT lung. Patchy areas of atelectasis and edema throughout the LEFT lung. Visualized abdomen is unremarkable. Multilevel degenerative changes of the thoracic spine. IMPRESSION: 1. Persistent moderate RIGHT pneumothorax, minimally decreased to similar in comparison to prior. 2. Persistent opacification of the RIGHT lung along the RIGHT lower hilar border, likely atelectasis. 3. Mildly improved aeration of the LEFT lung. Electronically Signed   By: Valentino Saxon MD   On: 08/31/2020 09:18   DG CHEST PORT 1 VIEW  Result Date: 08/31/2020 CLINICAL DATA:  Follow-up pneumothorax. EXAM: PORTABLE CHEST 1 VIEW COMPARISON:  08/29/2020 FINDINGS: The right-sided chest tube is stable in position. Enlarging right pneumothorax now estimated at 50%. Mild shift of the heart to the left side and lower left lung volumes with progressive left lung atelectasis. IMPRESSION: Enlarging right pneumothorax  now estimated at 50%. Some component of a tension pneumothorax is noted with slight shift of the heart to the left and lower left lung volumes with compressive atelectasis. These results will be called to the ordering clinician or representative by the Radiologist Assistant, and communication documented in the PACS or [employer]. Electronically Signed   By: Marijo Sanes M.D.   On: 08/31/2020 06:41   DG CHEST PORT 1 VIEW  Result Date: 08/29/2020 CLINICAL DATA:  Follow-up pneumothorax. EXAM: PORTABLE CHEST 1 VIEW COMPARISON:  08/28/2020 FINDINGS: Right chest tube remains in place. Enlargement of the right pneumothorax. Visceral to parietal pleural density in the right upper chest 3 cm compared with 2 cm yesterday. Moderate atelectasis in both lower lobes. No left pneumothorax. Probable small effusion on the left. Mild cardiomegaly and aortic atherosclerosis/coronary artery atherosclerosis again noted. IMPRESSION: 1. Enlargement of the right pneumothorax. Right chest tube remains in place. No sign of tension or mediastinal shift. 2. Moderate atelectasis in both lower lobes. Probable small left effusion. Electronically Signed   By: Nelson Chimes M.D.   On: 08/29/2020 10:40   DG CHEST PORT 1 VIEW  Result Date: 08/28/2020 CLINICAL DATA:  Shortness of breath EXAM: PORTABLE CHEST 1 VIEW COMPARISON:  Earlier today FINDINGS: Decrease in right pneumothorax, no longer seen at the base. Moderate pneumothorax still at the right apex, although decreased at 2 cm in thickness compared to 3 cm earlier. Stable lower lobe opacity and left pleural effusion. Coronary and mitral annular calcification. IMPRESSION: Decreased right  pneumothorax. Electronically Signed   By: Monte Fantasia M.D.   On: 08/28/2020 10:06   DG Chest Port 1 View  Result Date: 08/28/2020 CLINICAL DATA:  Acute respiratory failure EXAM: PORTABLE CHEST 1 VIEW COMPARISON:  Yesterday FINDINGS: Right chest tube in place. There is a moderate right  pneumothorax which is increasing at the base where hazy density is decreased and there may have been evacuation of pleural fluid. Pneumothorax thickness at the apex is similar to before at nearly 3 cm. Airspace opacity at the right lower lobe. Continued hazy opacity at the left base from both pleural fluid and parenchymal disease. Normal heart size. No mediastinal shift. These results will be called to the ordering clinician or representative by the Radiologist Assistant, and communication documented in the PACS or [employer]. IMPRESSION: 1. Moderate right pneumothorax with interval increase at the base where there may have been interval evacuation of pleural fluid. At the apex, thickness is unchanged at 3 cm. 2. Bilateral lower lobe opacity and left pleural effusion. Electronically Signed   By: Monte Fantasia M.D.   On: 08/28/2020 05:39   DG Chest Port 1 View  Result Date: 08/27/2020 CLINICAL DATA:  RIGHT pneumothorax. EXAM: PORTABLE CHEST 1 VIEW COMPARISON:  Radiograph 08/27/2020 at 447 hours FINDINGS: Interval placement of a RIGHT pigtail catheter chest tube. Persistent RIGHT pneumothorax which measures 33 mm from the apical chest wall compared to 39 mm for minimal expansion. Persistent bilateral pleural effusions. No mediastinal shift identified. IMPRESSION: 1. Interval placement of RIGHT chest tube with persistent RIGHT pneumothorax. Mild improvement. 2. Persistent bilateral pleural effusions. Electronically Signed   By: Suzy Bouchard M.D.   On: 08/27/2020 06:27   DG Chest Port 1 View  Result Date: 08/27/2020 CLINICAL DATA:  Initial evaluation for unsuccessful central line placement. EXAM: PORTABLE CHEST 1 VIEW COMPARISON:  Prior radiograph from 08/18/2020. FINDINGS: Cardiac and mediastinal silhouettes are stable, and remain within normal limits. Aortic atherosclerosis. Lungs mildly hypoinflated. Interval development of a moderate-sized right-sided pneumothorax. No mediastinal shift. Veiling  opacities overlying the bilateral hemidiaphragms consistent with pleural effusions. Superimposed patchy bibasilar opacities could reflect atelectasis or infiltrates, slightly worsened at the right lung base from previous. No acute osseous finding. IMPRESSION: 1. Interval development of moderate right-sided pneumothorax. No mediastinal shift. 2. Veiling opacities overlying the bilateral hemidiaphragms, consistent with bilateral pleural effusions. 3. Superimposed patchy bibasilar opacities, slightly worsened at the right lung base as compared to previous. Critical Value/emergent results were called by telephone at the time of interpretation on 08/27/2020 at 5:21 am to provider DAVID [facility] , who verbally acknowledged these results. Electronically Signed   By: Jeannine Boga M.D.   On: 08/27/2020 05:22   DG Chest Port 1 View  Result Date: 09/03/2020 CLINICAL DATA:  Weakness. EXAM: PORTABLE CHEST 1 VIEW COMPARISON:  08/10/2020 FINDINGS: The heart size is unremarkable. There are new moderate to large bilateral pleural effusions. There are hazy perihilar and bibasilar airspace opacities which are new from prior study. There is no pneumothorax. No acute osseous abnormality. Aortic calcifications are noted. IMPRESSION: 1. New moderate to large bilateral pleural effusions. 2. Worsening bibasilar airspace disease may represent consolidation or atelectasis. Electronically Signed   By: Constance Holster M.D.   On: 09/03/2020 17:17   DG Abd Portable 1V  Result Date: 08/30/2020 CLINICAL DATA:  Feeding tube placement EXAM: PORTABLE ABDOMEN-1 VIEW COMPARISON:  X-ray abdomen 07/07/2020, chest x-ray 08/29/2020 FINDINGS: Interval placement of enteric tube noted to course below the diaphragm with tip overlying  the gastric lumen. Right pigtail catheter overlying the right mid hemithorax. Interval increase in size of a large right pneumothorax. Small left pleural effusion. Severe coronary artery calcification. IMPRESSION: 1.  Enteric tube coursing below the diaphragm with tip overlying the expected region of the gastric lumen. 2. Interval increase in size of a large pneumothorax (compared to chest x-ray 08/29/2020) with right chest tube pigtail catheter in similar position. 3. Small left pleural effusion. Electronically Signed   By: Iven Finn M.D.   On: 08/30/2020 19:32    ASSESSMENT AND PLAN: This is a very pleasant 79 year old white male with CLL currently on observation. The patient had a rising WBC close to 100,000 and worsening anemia due to his underlying CLL and received 3 cycles of bendamustine and Rituxan. He had a lot of difficulty tolerating this treatment and it was discontinued after 3 cycles. He has had multiple hospitalizations over the past few months. Currently admitted with septic shock, probable aspiration pneumonia, and pneumothorax. He has developed pancytopenia during his hospitalization. Labs from admission today have been reviewed and his white blood cell count and platelets were completely normal on admission. He had his baseline anemia on admission.  CBC from today has been reviewed.  His hemoglobin is overall stable and platelets are slowly improving.  However, his WBC and absolute neutrophils are dropping.  He has low-grade fevers today.  Discussed with wife that CLL appears to be in remission.  We discussed that neutropenia is likely due to bone marrow suppression from recent septic shock and current infections.  It will likely take several weeks for counts to fully recover.  Given his ongoing low-grade fevers and high risk for infection, will administer Granix 300 mcg subcu x2 doses.  Recommend continued supportive care.  If consistent with a goals of care, transfuse PRBCs for hemoglobin less than 7 and platelets for platelet count less than 20,000 or active bleeding.  I discussed with the patient that he has had a continued decline in his performance status.  It may be reasonable to focus on  keeping him comfortable and for him to discharge home with hospice for extra support within the home.  She will have further conversations later today with Dr. Domingo Cocking and her daughter.  However, it appears as though she is leaning towards bringing the patient home with hospice.  Please call medical oncology over the weekend if any questions arise.    LOS: 12 days   Mikey Bussing, DNP, AGPCNP-BC, AOCNP 09/08/20

## 2020-09-08 NOTE — Progress Notes (Signed)
Chaplain visited patient. Wife and daughter Museum/gallery conservator bedside. Chaplain encountered daughter earlier this week and was following up.  Patient's Wife told chaplain the story of her son's death as a teenager, and their decision to foster, then adopt Amber as a [age] year old. Amber is very close to both adoptive parents. Amber said she wanted to spend the night with her father and planned to ask staff for permission to stay. Family is concerned about his liquid intake and inability to use call button when they are not in present. Wife says she is undergoing treatment for cancer as well. Chaplain had already referred family to the chaplain, Lorrin Jackson, at the cancer center. Patient's wife says Museum/gallery conservator, the daughter, is the one needing support. Chaplain offered prayer and said she would be available next week. They are awaiting appointment with doctor now to decide course/goals. Rev. Tamsen Snider Pager [phone]

## 2020-09-08 NOTE — Progress Notes (Addendum)
Nutrition Follow-up  DOCUMENTATION CODES:   Severe malnutrition in context of chronic illness, Underweight  INTERVENTION:  D/c Calorie count as pt is not eating or taking medications PO  Await decision regarding [place]. If pt is to continue receiving full care, recommend re-initiation of enteral nutrition. Consider:  -Glucerna 1.5 @ 25 ml/hr to advance by 10 ml every 12 hours to reach goal rate of 45 ml/hr with 200 ml free water every 4 hours. At goal rate, this regimen will provide 1620 kcal, 89 grams protein, 17 grams fiber, and 2020 ml free water.    If initiating feeding, pt will be at risk of refeeding syndrome. Monitor magnesium, potassium, and phosphorus daily for at least 3 days, MD to replete as needed.  NUTRITION DIAGNOSIS:   Severe Malnutrition related to chronic illness, cancer and cancer related treatments as evidenced by moderate fat depletion, moderate muscle depletion, severe fat depletion, severe muscle depletion.  Ongoing  GOAL:   Patient will meet greater than or equal to 90% of their needs  Not met  MONITOR:   TF tolerance, PO intake, Labs, Weight trends  REASON FOR ASSESSMENT:   Consult Calorie Count, Assessment of nutrition requirement/status, Poor PO  ASSESSMENT:   79 year old man with medical history of afib not on anticoagulation, HFrEF, CLL s/p 3 cycles chemo, DM, HTN, recent PNA on 8/27 and had 2 courses of abx. He presented to the ED due to poor PO intake, shortness of breath, and lethargy. He has had a productive cough.  9/12 R pigtail chest tube placed 9/15 NGT placed and pulled out by pt 9/16 NGT replaced; pigtail chest tube replaced  9/21 NGT dislodged   Pt with Iatrogenic Rt pneumothorax. Chest tube continues to suction with air leak. Per CCM, pt has had a progressive decline in health status over the past several months. Palliative Care has been consulted to discuss [place]. Meeting is scheduled for today.   RD consulted for calorie count.  Pt's wife reports that pt hasn't been able to eat and pt is documented to have had 0% PO intake. Will d/c calorie count at this time.    If decision is made to continue with full scope of treatment, enteral nutrition should be resumed. Pt already with orders for Glucerna 1.5 @ 16m with instructions to titrate to 491mhr.   Per RN, pt too lethargic to take PO medications today.   Chest tube: 22062mutput x24 hours Rectal tube: 56m67mtput x24 hours I/O: -2772.3ml 73mce admit   PO Intake: 0% x 8 recorded meals   Labs: K+ 3.2 (L), Mg 1.6 (L), CBGs 96-127-125 Medications: Novolog, Remeron, MVI, Protonix, Klor-con, Florastor  Diet Order:   Diet Order            Diet regular Room service appropriate? Yes; Fluid consistency: Thin  Diet effective now                 EDUCATION NEEDS:   No education needs have been identified at this time  Skin:  Skin Assessment: Skin Integrity Issues: Skin Integrity Issues:: Stage II, DTI DTI: nose Stage II: L side of sacrum  Last BM:  9/23 56ml 64mrectal tube  Height:   Ht Readings from Last 1 Encounters:  08/27/20 5' 7.5" (1.715 m)    Weight:   Wt Readings from Last 1 Encounters:  09/06/20 54.2 kg   BMI:  Body mass index is 18.44 kg/m.  Estimated Nutritional Needs:   Kcal:  1550-1770 kcal  Protein:  70-85 grams  Fluid:  >/= 1.8 L/day    Larkin Ina, MS, RD, LDN RD pager number and weekend/on-call pager number located in [place].

## 2020-09-08 NOTE — Progress Notes (Signed)
    NAME:  Tony Marshall, MRN:  [id number], DOB:  [birth year], LOS: 12 ADMISSION DATE:  09/12/2020, CONSULTATION DATE:  09/08/20 REFERRING MD:  EDP, CHIEF COMPLAINT:  lethargy  Brief History   78 yowm, never smoker, with history of HFrEF and CLL s/p three cycles chemo and rx avelox since 8/27 for PNA who presented with lethargy and poor PO intake.  Found to have worsening bibasilar airspace disease and pleural effusions with hypotension despite 3.5L IVF. PCCM consulted for admission. Labs were significant for WBC 3.3, Hgb 6.1 down from baseline of 9 several weeks ago, Lactic acid 3.2, Covid-19 negative. Course complicated by right pneumothorax after right IJ attempt.  Past Medical History  Atrial fibrillation not on anticoagulation HFrEF CLL s/p 3 cycles chemo DM HTN GERD OSA  Significant Hospital Events   9/11 Admit to PCCM 9/15 weaned off vasopressors ,increasing delirium, pulled out his central line  9/17 transfer to [facility], PCCM following for chest tube 9/20 Residual chest tube air leak 1/7  Consults:  None  Procedures:  9/12 RIJ attempted, R pigtail chest tube by ED >> 9/16 removed since non functional 9/12 L Fem CVL >> 9/14 , pulled out 9/16 RT pigtail >>  Significant Diagnostic Tests:     Micro Data:  Sars-Cov-2 PCR 9/11 > negative Blood Cx  9/12 > negative Urine Cx  9/12 > insig growth  Antimicrobials:  Zozyn 9/11 only Vancomycin 9/11 >> 9/13 Cefepime 9/12 >> 9/18   Interim history/subjective:  Persistent cough.  Wife says he hasn't been able to eat.  Objective   Blood pressure 134/66, pulse (!) 105, temperature (!) 100.5 F (38.1 C), temperature source Oral, resp. rate 20, height 5' 7.5" (1.715 m), weight 54.2 kg, SpO2 98 %.        Intake/Output Summary (Last 24 hours) at 09/08/2020 1038 Last data filed at 09/08/2020 5329 Gross per 24 hour  Intake 379.73 ml  Output 620 ml  Net -240.27 ml   Exam:   General - cachectic Eyes - pupils reactive ENT  - no sinus tenderness, no stridor Cardiac - regular, tachycardic Chest - b/l crackles, Rt chest tube with 1/7 air leak Abdomen - soft, non tender, + bowel sounds Extremities - decreased muscle bulk Skin - no rashes Neuro - confused  Resolved Hospital Problems  AKI, metabolic acidosis without anion gap, very mild lactic acidosis:  Septic shock Pneumonia, possible aspiration pneumonia  Assessment & Plan:   Iatrogenic Rt pneumothorax. - s/p chest tube on 9/12 and replaced on 9/16 - still has air leak on 9/24 - continue chest tube to -10 cm suction - f/u CXR  Goals of care. - he has progressive decline in health status over the past several months - palliative care consulted - spoke with patients wife on 09/08/20 >> seems to be inclined toward getting him home with home hospice if possible  Signature:  Chesley Mires, MD [place] Pager - [phone] 09/08/2020, 10:41 AM

## 2020-09-08 NOTE — Progress Notes (Signed)
PROGRESS NOTE   Tony Marshall  JSE:[id number]    DOB: [birth year]    DOA: 08/27/2020  PCP: Timoteo Gaul, FNP   I have briefly reviewed patients previous medical records in [facility].  Chief Complaint  Patient presents with  . Hypotension  . Weakness    Brief Narrative:  79 year old male with PMH of chronic systolic CHF, CLL on chemotherapy presented with shock and pneumonia.  He developed large right-sided iatrogenic pneumothorax, now status post chest tube and PCCM continue to follow.   Assessment & Plan:  Active Problems:   CLL (chronic lymphocytic leukemia) (HCC)   Acute respiratory failure (HCC)   Other pancytopenia (HCC)   Acute combined systolic and diastolic heart failure (HCC)   Shock circulatory (HCC)   Septic shock (HCC)   Iatrogenic pneumothorax on R    AKI (acute kidney injury) ([place])   Pressure injury of skin   Pneumothorax on right   Pneumonia due to infectious organism   Severe protein-calorie malnutrition (HCC)   Hypokalemia   Hypomagnesemia   Hypophosphatemia   Acute metabolic encephalopathy  Sepsis/febrile neutropenia: Overnight and into today, ongoing low-grade fever in the 100s, tachycardia, tachypnea and may be more lethargic.  I communicated with Dr. Domingo Cocking, PMT this morning and later this afternoon after there [place] meeting with family who wished to treat the treatable.  Thereby initiated sepsis focused order set, gentle IV fluid bolus x1, cultures, initiated IV vancomycin and cefepime per pharmacy.  Source of infection could be HCAP versus recurrent aspiration pneumonia.  Chest x-ray shows multifocal bilateral pulmonary infiltrates.  Overall quite ill.  Acute respiratory failure with hypoxia: Noted on 9/24 due to pneumonia, AMS.  Treat as above and oxygen support.  Septic shock secondary to pneumonia and hypovolemia from poor oral intake, POA: Shock resolved.  Has been off of vasopressors since 9/15.  Has completed 7 days of IV cefepime.   Please see above, antibiotics reinitiated 9/24.  Possible aspiration pneumonia MRSA PCR negative.  Patient noted to have history of Klebsiella in sputum.  Speech therapy evaluated and did not have any concerns for aspiration.  S/p 7 days of IV cefepime.  No further antibiotics indicated at this time.  Monitor.  Iatrogenic right-sided pneumothorax PCCM continues to follow.  S/p chest tube on 9/12, replaced 9/16, still having air leak on 9/24, continuing chest tube to -10 cm suction.  Acute kidney injury/metabolic acidosis: S/p bicarbonate drip.  Resolved.  Type II DM with hyperglycemia A1c 5.8 on 9/13.  Noted to have hypoglycemic episodes early this morning.  Likely related to poor oral intake.  Discontinued Lantus and changed SSI to sensitive.  Hypophosphatemia/hypomagnesemia/hypokalemia: Hypophosphatemia replace.  Replace magnesium and potassium and follow BMP.  Acute metabolic encephalopathy Likely multifactorial.  Avoid sedatives.  Delirium precautions.  Seemed more lethargic this morning, may be related to sepsis physiology.  CLL: S/p 3 cycles chemotherapy.  Outpatient follow-up with oncology.  Pancytopenia: Likely due to sepsis/acute illness causing bone marrow suppression.  Hemoglobin stable.  Platelets improving/82.  Progressive leukopenia/neutropenia.  I personally discussed with oncology and they are follow-up appreciated.  Neutropenia may be due to bone marrow suppression from recent septic shock, current infection and are giving Granix 300 mcg subcutaneously x2 doses.  Transfuse PRBC if hemoglobin 7 g or less and platelets if platelet count 20 K no rectal bleeding.  Hyponatremia: Resolved.  GOC: PMT did family meeting on 9/24.  They would like treatable conditions treated, especially concerned about fever today and  would like any suspected infection treated prior to DC home with hospice and then does not want to come back to the hospital once he leaves.  Continue Granix,  CODE STATUS changed to partial code/no CPR.  Pressure Injury 08/27/20 Sacrum Medial Stage 2 -  Partial thickness loss of dermis presenting as a shallow open injury with a red, pink wound bed without slough. Red area with small area of broken skin in center (Active)  08/27/20 1024  Location: Sacrum  Location Orientation: Medial  Staging: Stage 2 -  Partial thickness loss of dermis presenting as a shallow open injury with a red, pink wound bed without slough.  Wound Description (Comments): Red area with small area of broken skin in center  Present on Admission: Yes     Pressure Injury 08/27/20 Nose Left Deep Tissue Pressure Injury - Purple or maroon localized area of discolored intact skin or blood-filled blister due to damage of underlying soft tissue from pressure and/or shear. (Active)  08/27/20 1024  Location: Nose  Location Orientation: Left  Staging: Deep Tissue Pressure Injury - Purple or maroon localized area of discolored intact skin or blood-filled blister due to damage of underlying soft tissue from pressure and/or shear.  Wound Description (Comments):   Present on Admission: Yes    Body mass index is 18.44 kg/m.  Nutritional Status Nutrition Problem: Severe Malnutrition Etiology: chronic illness, cancer and cancer related treatments Signs/Symptoms: moderate fat depletion, moderate muscle depletion, severe fat depletion, severe muscle depletion Interventions: Magic cup, Tube feeding, Other (Comment) (Prosource TF)  Patient continues to have poor oral intake.  On PTA Marinol.  NG tube discontinued.  PMD following.  Diarrhea: Has rectal tube with liquid green stools.  Low index of suspicion for C. difficile.  May be related to recent infection and antibiotic exposure.  Trial of probiotics.  Adult failure to thrive: Multifactorial due to advanced age, CLL, multiple severe comorbidities and acute illness listed above.  Reportedly not eating well even prior to recurrence of CLL  and even worse after chemotherapy initiation.  Daughter also thinks that patient may be quite depressed.  She brought up a medication that another provider had discussed which she said would "open up appetite and improve mood".  She states that Marinol is not really working.  DC'd Marinol.  Trial of Remeron.  Discussed regarding further [place] discussion and will reconsult palliative care.  Requested RN for calorie count.  DVT prophylaxis: Place and maintain sequential compression device Start: 08/27/20 0918 SCDs Start: 08/27/20 0058     Code Status: Partial Code  Family Communication: Discussed in detail with patient's daughter at bedside on 9/23, updated care and answered questions. Disposition:  Status is: Inpatient  Remains inpatient appropriate because:Inpatient level of care appropriate due to severity of illness   Dispo: The patient is from: Home              Anticipated d/c is to: TBD              Anticipated d/c date is: > 3 days              Patient currently is not medically stable to d/c.        Consultants:     Procedures:   9/12 RIJ attempted, R pigtail chest tube by ED >> 9/16 removed since non functional 9/12 L Fem CVL >> 9/14 , pulled out 9/16 RT pigtail >>   Antimicrobials:    Anti-infectives (From admission, onward)   Start  Dose/Rate Route Frequency Ordered Stop   09/09/20 0400  vancomycin (VANCOREADY) IVPB 500 mg/100 mL        500 mg 100 mL/hr over 60 Minutes Intravenous Every 12 hours 09/08/20 1456     09/08/20 2200  ceFEPIme (MAXIPIME) 2 g in sodium chloride 0.9 % 100 mL IVPB        2 g 200 mL/hr over 30 Minutes Intravenous Every 8 hours 09/08/20 1456     09/08/20 1600  vancomycin (VANCOCIN) IVPB 1000 mg/200 mL premix        1,000 mg 200 mL/hr over 60 Minutes Intravenous  Once 09/08/20 1447     09/08/20 1515  ceFEPIme (MAXIPIME) 2 g in sodium chloride 0.9 % 100 mL IVPB        2 g 200 mL/hr over 30 Minutes Intravenous  Once 09/08/20 1447      08/27/20 2200  vancomycin (VANCOREADY) IVPB 500 mg/100 mL  Status:  Discontinued        500 mg 100 mL/hr over 60 Minutes Intravenous Every 24 hours 08/27/20 0136 08/28/20 1133   08/27/20 0200  ceFEPIme (MAXIPIME) 2 g in sodium chloride 0.9 % 100 mL IVPB        2 g 200 mL/hr over 30 Minutes Intravenous Every 12 hours 08/27/20 0136 09/02/20 1521   08/21/2020 1930  vancomycin (VANCOCIN) IVPB 1000 mg/200 mL premix        1,000 mg 200 mL/hr over 60 Minutes Intravenous  Once 09/13/2020 1927 08/21/2020 2305   08/22/2020 1930  piperacillin-tazobactam (ZOSYN) IVPB 3.375 g        3.375 g 100 mL/hr over 30 Minutes Intravenous  Once 09/05/2020 1927 08/23/2020 2154        Subjective:  Patient seen along with RN this morning.  RN alerted MD regarding low-grade fevers, tachypnea, tachycardia and yellow MEWS.  Appeared more lethargic today, arousable but unable to obtain any history from him.  Cough noted by CCM MD.   Objective:   Vitals:   09/08/20 1012 09/08/20 1207 09/08/20 1210 09/08/20 1214  BP: 134/66 120/66    Pulse: (!) 105 (!) 114    Resp: 20 20    Temp: (!) 100.5 F (38.1 C) 99 F (37.2 C)    TempSrc: Oral Oral    SpO2: 98% 91% (!) 86% 92%  Weight:      Height:        General exam: Elderly male, moderately built and frail, chronically ill looking, looks worse today than he did over the last 2 days. Respiratory system: Poor inspiratory effort.  Diminished breath sounds in the bases and breath sounds somewhat harsh.  Occasional basal crackles.  No wheezing or rhonchi.  No increased work of breathing. Cardiovascular system: S1 & S2 heard, RRR. No JVD, murmurs, rubs, gallops or clicks. No pedal edema.  Telemetry personally reviewed: SR-ST in the 110s. Gastrointestinal system: Abdomen is nondistended, soft and nontender. No organomegaly or masses felt. Normal bowel sounds heard.  Has rectal tube with liquid green stools. Central nervous system: Lethargic but arousable, does not stay awake and not  answering questions. No focal neurological deficits. Extremities: Symmetric 5 x 5 power. Skin: No rashes, lesions or ulcers Psychiatry: Judgement and insight impaired. Mood & affect cannot be assessed.    Data Reviewed:   I have personally reviewed following labs and imaging studies   CBC: Recent Labs  Lab 09/06/20 0616 09/07/20 0747 09/08/20 0543  WBC 0.9* 0.6* 0.4*  NEUTROABS 0.4* 0.2* 0.1*  HGB 9.4* 9.1* 8.4*  HCT 29.3* 27.9* 26.3*  MCV 87.7 86.4 88.0  PLT 52* 77* 82*    Basic Metabolic Panel: Recent Labs  Lab 09/06/20 0616 09/07/20 0747 09/08/20 0543  NA 134* 136 135  K 2.1* 3.0* 3.2*  CL 92* 92* 93*  CO2 30 33* 30  GLUCOSE 140* 125* 99  BUN 20 20 20   CREATININE 0.57* 0.64 0.58*  CALCIUM 7.6* 8.2* 7.7*  MG 1.5* 1.7 1.6*  PHOS 2.4* 1.8* 3.0    Liver Function Tests: Recent Labs  Lab 09/04/20 0425 09/07/20 0747 09/08/20 0543  AST 32  --   --   ALT 9  --   --   ALKPHOS 50  --   --   BILITOT 0.9  --   --   PROT 4.0*  --   --   ALBUMIN 2.3* 2.2* 2.1*    CBG: Recent Labs  Lab 09/08/20 0746 09/08/20 1034 09/08/20 1159  GLUCAP 96 127* 125*    Microbiology Studies:  No results found for this or any previous visit (from the past 240 hour(s)).   Radiology Studies:  DG Chest Port 1 View  Result Date: 09/08/2020 CLINICAL DATA:  Pneumothorax. EXAM: PORTABLE CHEST 1 VIEW COMPARISON:  09/07/2020. FINDINGS: Right chest tube in stable position. Stable small right pneumothorax. Heart size normal. Multifocal bilateral pulmonary infiltrates are again noted. Small bilateral pleural effusions again noted. Degenerative change thoracic spine. IMPRESSION: 1. Right chest tube in stable position. Small right apical pneumothorax again noted without interim change. 2. Multifocal bilateral pulmonary infiltrates are again noted. Similar findings on prior exam. Small bilateral pleural effusions again noted. Electronically Signed   By: Marcello Moores  Register   On: 09/08/2020 07:47    DG CHEST PORT 1 VIEW  Result Date: 09/07/2020 CLINICAL DATA:  Recent pneumothorax with chest tube in place EXAM: PORTABLE CHEST 1 VIEW COMPARISON:  September 07, 2020 study obtained earlier in the day. FINDINGS: Chest tube on the right is unchanged position with persistent small right apical pneumothorax. No tension component. There is a questionable small right pleural effusion, not seen earlier in the day. There is a pleural effusion on the left. There is airspace opacity in the right mid lung as well as in the left perihilar and lower lobe regions. There is apparent scarring in the left upper lobe. Heart is mildly enlarged, stable, with pulmonary vascularity grossly stable. No adenopathy appreciable by radiography. No bone lesions. There is aortic atherosclerosis. IMPRESSION: Chest tube position on the right unchanged. Small right apical pneumothorax without tension component. Areas of airspace opacity bilaterally. Suspect new small right pleural effusion. Stable pleural effusion on the left. Stable scarring left upper lobe. Aortic Atherosclerosis (ICD10-I70.0). Electronically Signed   By: Lowella Grip III M.D.   On: 09/07/2020 10:40   DG CHEST PORT 1 VIEW  Result Date: 09/07/2020 CLINICAL DATA:  Pneumothorax.  Chest tube. EXAM: PORTABLE CHEST 1 VIEW COMPARISON:  09/06/2020. FINDINGS: Right chest tube in stable position. Stable small right apical pneumothorax. Mediastinum is stable. Heart size stable. Bilateral multifocal pulmonary infiltrates again noted. Small left pleural effusion again noted. IMPRESSION: 1. Right chest tube in stable position. Small right apical pneumothorax again noted. 2. Multifocal bilateral pulmonary infiltrates again noted without interim change. Small left pleural effusion again noted. Electronically Signed   By: [place]   On: 09/07/2020 06:12     Scheduled Meds:   . Chlorhexidine Gluconate Cloth  6 each Topical Daily  . insulin aspart  0-9 Units  Subcutaneous TID WC  . mouth rinse  15 mL Mouth Rinse BID  . mirtazapine  7.5 mg Oral QHS  . multivitamin with minerals  1 tablet Oral Daily  . pantoprazole  40 mg Oral Daily  . potassium chloride  40 mEq Oral Q4H  . saccharomyces boulardii  250 mg Oral BID  . sodium chloride flush  10 mL Intracatheter Q8H  . sodium chloride flush  10-40 mL Intracatheter Q12H  . Tbo-filgrastim (GRANIX) injection [facility]  300 mcg Subcutaneous q1800    Continuous Infusions:   . sodium chloride 10 mL/hr at 09/01/20 1800  . acetaminophen 1,000 mg (09/08/20 1043)  . ceFEPime (MAXIPIME) IV 2 g (09/08/20 1526)  . ceFEPime (MAXIPIME) IV    . feeding supplement (GLUCERNA 1.5 CAL) 45 mL/hr at 09/04/20 1400  . sodium chloride    . vancomycin    . [START ON 09/09/2020] vancomycin       LOS: 12 days     Vernell Leep, MD, [place], [facility]. [facility]    To contact the attending provider between 7A-7P or the covering provider during after hours 7P-7A, please log into the web site [email or web address] and access using universal [place] password for that web site. If you do not have the password, please call the hospital operator.  09/08/2020, 3:43 PM

## 2020-09-08 NOTE — Progress Notes (Signed)
Pharmacy Antibiotic Note  Tony Marshall is a 79 y.o. male admitted on 09/10/2020 with pneumonia.  Pharmacy has been consulted for vanc/cefepime dosing.   Plan: Vanc 1g IV x 1 then 500mg  IV q12 - goal trough 15-20 Cefepime 2g IV q12  Height: 5' 7.5" (171.5 cm) Weight: 54.2 kg (119 lb 7.8 oz) IBW/kg (Calculated) : 67.25  Temp (24hrs), Avg:99.4 F (37.4 C), Min:99 F (37.2 C), Max:100.5 F (38.1 C)  Recent Labs  Lab 09/04/20 0425 09/05/20 0456 09/06/20 0616 09/07/20 0747 09/08/20 0543  WBC 1.7* 1.1* 0.9* 0.6* 0.4*  CREATININE 0.57* 0.54* 0.57* 0.64 0.58*    Estimated Creatinine Clearance: 60.2 mL/min (A) (by C-G formula based on SCr of 0.58 mg/dL (L)).    Allergies  Allergen Reactions  . Glycopyrrolate Rash     Thank you for allowing pharmacy to be a part of this patient's care.  Kara Mead 09/08/2020 2:53 PM

## 2020-09-08 NOTE — Progress Notes (Signed)
Palliative Care Progress Note  Reason for visit: Goals of care  I met with patient, his wife, and his daughter.  He was sleepy and would not wake up for conversation.    We discussed clinical course as well as wishes moving forward in regard to care plan this hospitalization.  Concepts specific to code status and rehospitalization discussed.  We discussed difference between a aggressive medical intervention path and a palliative, comfort focused care path.  Values and goals of care important to patient and family were attempted to be elicited.  Overall, family would like to "treat what is treatable" this admission, then transition home with hospice support when he is discharged.  Concept of Hospice and Palliative Care were discussed.  Questions and concerns addressed.   PMT will continue to support holistically.  Summary/Recommendations: 1) Family is open to hospice support at time of discharge from the hospital.  He does not want to come back to the hospital once he leaves.  We discussed that hospice does not provide IVF at home. 2) They would like to treat treatable conditions this admission.  They are concerned about fever today and would like any suspected infection worked up and treated prior to him going home with hospice (or d/c with abx to complete treatment course if needed).  I will reach out to Dr. Algis Liming to discuss as desire for infectious workup is different from plan we had discussed earlier to get home as soon as possible with hospice support. 3) They would appreciate PCCM input on what to do regarding chest tube at time of discharge.  I discussed with hospice who will manage chest tube with water seal at home, but they do not do chest tubes to suction. 4) They would like him to receive both doses of Granix as discussed with oncology. 5) We discussed Code Status.  He remains a partial code.  At this point, no CPR in the event of cardiac arrest.  He stated on admission that he  would want a trial of mechanical ventilation "for a little while" if he suffers from respiratory arrest.  We discussed that this is unlikely to result in him recovering enough to leave the hospital, and they express understanding.  At the same time, his wife states, "That is what he said he wanted, so that is what I think we should do." 6) Family requested to meet with [facility] to discuss home hospice.  Will place referral to TOC.  Start time: 1330 End time: 1430 Total time: 60 minutes

## 2020-09-08 NOTE — Progress Notes (Signed)
   09/08/20 1012  Assess: MEWS Score  Temp (!) 100.5 F (38.1 C)  BP 134/66  Pulse Rate (!) 105  Resp 20  SpO2 98 %  Assess: MEWS Score  MEWS Temp 1  MEWS Systolic 0  MEWS Pulse 1  MEWS RR 0  MEWS LOC 0  MEWS Score 2  MEWS Score Color Yellow  Assess: if the MEWS score is Yellow or Red  Were vital signs taken at a resting state? Yes  Focused Assessment No change from prior assessment  Early Detection of Sepsis Score *See Row Information* Low  MEWS guidelines implemented *See Row Information* Yes  Treat  MEWS Interventions Other (Comment)  Take Vital Signs  Increase Vital Sign Frequency  Yellow: Q 2hr X 2 then Q 4hr X 2, if remains yellow, continue Q 4hrs  Escalate  MEWS: Escalate Yellow: discuss with charge nurse/RN and consider discussing with provider and RRT  Notify: Charge Nurse/RN  Name of Charge Nurse/RN Notified Marissa RN  Date Charge Nurse/RN Notified 09/08/20  Time Charge Nurse/RN Notified 1020  Notify: Provider  Provider Name/Title Hongalgi  Date Provider Notified 09/08/20  Time Provider Notified 1020  Notification Type Page  Notification Reason Other (Comment) (Mews change)  Response See new orders  Date of Provider Response 09/08/20  Time of Provider Response 1024    Will implement new order and continue to monitor pt.

## 2020-09-08 NOTE — Progress Notes (Signed)
PT Cancellation Note  Patient Details Name: Tony Marshall MRN: [id number] DOB: 12/05/1941   Cancelled Treatment:     Pt unable to tolerate with increased temp and tacky.  Pt has been evaluated and per chart review family deciding on D/C plans.      Nathanial Rancher 09/08/2020, 4:22 PM

## 2020-09-08 NOTE — Progress Notes (Signed)
Hypoglycemic Event  CBG: 69  Treatment: 50% Dextrose of 25gm  Symptoms: none  Follow-up CBG: Time: 0025 CBG Result: 116  Possible Reasons for Event: poor appetite   Comments/MD notified: yes, Jeannette Corpus MD  The nurse made an attempt to give the patient orange  Juice or regular soda. The patient refused. Refused any PO.      Oron [employer]

## 2020-09-09 ENCOUNTER — Inpatient Hospital Stay (HOSPITAL_COMMUNITY): Payer: Medicare HMO

## 2020-09-09 DIAGNOSIS — J690 Pneumonitis due to inhalation of food and vomit: Secondary | ICD-10-CM

## 2020-09-09 LAB — GLUCOSE, CAPILLARY
Glucose-Capillary: 104 mg/dL — ABNORMAL HIGH (ref 70–99)
Glucose-Capillary: 105 mg/dL — ABNORMAL HIGH (ref 70–99)
Glucose-Capillary: 114 mg/dL — ABNORMAL HIGH (ref 70–99)
Glucose-Capillary: 137 mg/dL — ABNORMAL HIGH (ref 70–99)
Glucose-Capillary: 148 mg/dL — ABNORMAL HIGH (ref 70–99)
Glucose-Capillary: 193 mg/dL — ABNORMAL HIGH (ref 70–99)

## 2020-09-09 LAB — BLOOD CULTURE ID PANEL (REFLEXED) - BCID2

## 2020-09-09 LAB — RENAL FUNCTION PANEL
Albumin: 2.3 g/dL — ABNORMAL LOW (ref 3.5–5.0)
Anion gap: 14 (ref 5–15)
BUN: 29 mg/dL — ABNORMAL HIGH (ref 8–23)
CO2: 26 mmol/L (ref 22–32)
Calcium: 8.1 mg/dL — ABNORMAL LOW (ref 8.9–10.3)
Chloride: 94 mmol/L — ABNORMAL LOW (ref 98–111)
Creatinine, Ser: 0.89 mg/dL (ref 0.61–1.24)
GFR calc Af Amer: >60 mL/min (ref >60)
GFR calc non Af Amer: >60 mL/min (ref >60)
Glucose, Bld: 160 mg/dL — ABNORMAL HIGH (ref 70–99)
Phosphorus: 3.6 mg/dL (ref 2.5–4.6)
Potassium: 4.4 mmol/L (ref 3.5–5.1)
Sodium: 134 mmol/L — ABNORMAL LOW (ref 135–145)

## 2020-09-09 LAB — CBC WITH DIFFERENTIAL/PLATELET
Abs Immature Granulocytes: 0.03 10*3/uL (ref 0.00–0.07)
Basophils Absolute: 0 10*3/uL (ref 0.0–0.1)
Basophils Relative: 0 %
Eosinophils Absolute: 0 10*3/uL (ref 0.0–0.5)
Eosinophils Relative: 0 %
HCT: 29.1 % — ABNORMAL LOW (ref 39.0–52.0)
Hemoglobin: 9.2 g/dL — ABNORMAL LOW (ref 13.0–17.0)
Immature Granulocytes: 4 %
Lymphocytes Relative: 69 %
Lymphs Abs: 0.5 10*3/uL — ABNORMAL LOW (ref 0.7–4.0)
MCH: 28.1 pg (ref 26.0–34.0)
MCHC: 31.6 g/dL (ref 30.0–36.0)
MCV: 89 fL (ref 80.0–100.0)
Monocytes Absolute: 0.1 10*3/uL (ref 0.1–1.0)
Monocytes Relative: 14 %
Neutro Abs: 0.1 10*3/uL — ABNORMAL LOW (ref 1.7–7.7)
Neutrophils Relative %: 13 %
Platelets: 128 10*3/uL — ABNORMAL LOW (ref 150–400)
RBC: 3.27 MIL/uL — ABNORMAL LOW (ref 4.22–5.81)
RDW: 21.2 % — ABNORMAL HIGH (ref 11.5–15.5)
WBC: 0.7 10*3/uL — CL (ref 4.0–10.5)
nRBC: 0 % (ref 0.0–0.2)

## 2020-09-09 LAB — MAGNESIUM: Magnesium: 2.3 mg/dL (ref 1.7–2.4)

## 2020-09-09 MED ORDER — ACETAMINOPHEN 160 MG/5ML PO SOLN: 650.0000 mg | ORAL | Status: DC | PRN

## 2020-09-09 MED ORDER — POTASSIUM CHLORIDE IN NACL 20-0.9 MEQ/L-% IV SOLN
INTRAVENOUS | Status: DC
  Filled 2020-09-09 (×2): qty 1000

## 2020-09-09 MED ORDER — LIP MEDEX EX OINT
TOPICAL_OINTMENT | CUTANEOUS | Status: DC | PRN
  Administered 2020-09-13: 1 via TOPICAL
  Filled 2020-09-09: qty 7

## 2020-09-09 MED ORDER — ALPRAZOLAM 0.25 MG PO TABS
0.2500 mg | ORAL_TABLET | Freq: Three times a day (TID) | ORAL | Status: DC | PRN
  Administered 2020-09-09 – 2020-09-10 (×3): 0.25 mg via ORAL
  Filled 2020-09-09 (×3): qty 1

## 2020-09-09 MED ORDER — SODIUM CHLORIDE 0.9 % IV SOLN
2.0000 g | Freq: Two times a day (BID) | INTRAVENOUS | Status: DC
  Administered 2020-09-09 – 2020-09-14 (×10): 2 g via INTRAVENOUS
  Filled 2020-09-09 (×10): qty 2

## 2020-09-09 NOTE — Progress Notes (Signed)
Manufacturing engineer [facility])  Received referral for hospice services once pt discharges.  Met with pt and his wife in the room.    Pt unable to participate in conversation due to frequent coughing.   Advised the wife that we will keep in touch with her as his hospitalization continues.   He would likely need paid caregivers in addition to hospice support.  Will continue to follow and help with d/c needs.  Venia Carbon RN, BSN, [facility] Liaison

## 2020-09-09 NOTE — Progress Notes (Signed)
Daily Progress Note   Patient Name: Tony Marshall       Date: 09/09/2020 DOB: 06-22-1941  Age: 79 y.o. MRN#: [id number] Attending Physician: Modena Jansky, MD Primary Care Physician: Timoteo Gaul, FNP Admit Date: 09/08/2020  Reason for Consultation/Follow-up: Establishing goals of care  Subjective: I saw and examined Tony Marshall today.  He was lying in bed in no distress.  He was more alert and interactive today than my encounter with family yesterday.  He denies any complaints at this time.  His wife is also at the bedside.  She reports being happy to hear that his pneumothorax improved with increasing suction.  We reviewed his clinical course overnight and plan for the weekend to continue with suction for chest tube and treatment with antibiotics.  I answered his questions about care plan here in the hospital and plan for transitioning home with hospice (hoping for sometime early next week).  We discussed that hospice will continue to follow peripherally to discuss plan for discharge home with hospice once we have a better idea how he does clinically of the next couple of days.  Length of Stay: 13  Current Medications: Scheduled Meds:  . Chlorhexidine Gluconate Cloth  6 each Topical Daily  . insulin aspart  0-9 Units Subcutaneous TID WC  . mouth rinse  15 mL Mouth Rinse BID  . mirtazapine  7.5 mg Oral QHS  . multivitamin with minerals  1 tablet Oral Daily  . pantoprazole  40 mg Oral Daily  . saccharomyces boulardii  250 mg Oral BID  . sodium chloride flush  10 mL Intracatheter Q8H  . sodium chloride flush  10-40 mL Intracatheter Q12H  . Tbo-filgrastim (GRANIX) injection [facility]  300 mcg Subcutaneous q1800    Continuous Infusions: . sodium chloride 10 mL/hr at 09/01/20  1800  . 0.9 % NaCl with KCl 20 mEq / L 50 mL/hr at 09/09/20 0954  . ceFEPime (MAXIPIME) IV    . vancomycin 500 mg (09/09/20 0459)    PRN Meds: sodium chloride, acetaminophen (TYLENOL) oral liquid 160 mg/5 mL, ALPRAZolam, benzonatate, guaiFENesin-dextromethorphan, hydrALAZINE, lip balm, sodium chloride flush  Physical Exam         General: Alert, awake, in no acute distress. Interactive today.  Weak and frail. HEENT: No bruits, no goiter, no JVD Heart:  Regular rate and rhythm. No murmur appreciated. Lungs: Diminished air movement, crackles in base on L Abdomen: Soft, nontender, nondistended, positive bowel sounds.  Ext: +edema Skin: Warm and dry Neuro: Grossly intact, nonfocal.   Vital Signs: BP 106/69 (BP Location: Left Arm)   Pulse 92   Temp 97.8 F (36.6 C) (Oral)   Resp 18   Ht 5' 7.5" (1.715 m)   Wt 54.2 kg   SpO2 99%   BMI 18.44 kg/m  SpO2: SpO2: 99 % O2 Device: O2 Device: Nasal Cannula O2 Flow Rate: O2 Flow Rate (L/min): 2 L/min  Intake/output summary:   Intake/Output Summary (Last 24 hours) at 09/09/2020 1629 Last data filed at 09/09/2020 0600 Gross per 24 hour  Intake 1500.36 ml  Output 750 ml  Net 750.36 ml   LBM: Last BM Date: 09/08/20 Baseline Weight: Weight: 52.3 kg Most recent weight: Weight: 54.2 kg       Palliative Assessment/Data:    Flowsheet Rows     Most Recent Value  Intake Tab  Referral Department Hospitalist  Unit at Time of Referral Med/Surg Unit  Palliative Care Primary Diagnosis Other (Comment)  [FTT]  Date Notified 09/03/20  Palliative Care Type Return patient Palliative Care  Reason for referral Clarify Goals of Care  Date of Admission 08/25/2020  Date first seen by Palliative Care 09/04/20  # of days Palliative referral response time 1 Day(s)  # of days IP prior to Palliative referral 8  Clinical Assessment  Psychosocial & Spiritual Assessment  Palliative Care Outcomes      Patient Active Problem List   Diagnosis Date  Noted  . Pneumothorax on right   . Pneumonia due to infectious organism   . Severe protein-calorie malnutrition ([place])   . Hypokalemia   . Hypomagnesemia   . Hypophosphatemia   . Acute metabolic encephalopathy   . Pressure injury of skin 08/30/2020  . Septic shock ([place]) 08/27/2020  . Iatrogenic pneumothorax on R  08/27/2020  . AKI (acute kidney injury) ([place]) 08/27/2020  . Hypotension due to hypovolemia   . Advanced care planning/counseling discussion   . Palliative care by specialist   . Cough   . DNR (do not resuscitate)   . Physical deconditioning 07/19/2020  . Stercoral ulcer of rectum   . Prolonged QT interval 07/03/2020  . Atrial fibrillation ([place])   . Shock circulatory ([place]) 07/01/2020  . Lower GI bleed   . Central venous catheter in place   . Fecal impaction ([place])   . Overflow diarrhea   . Rectal bleeding   . DKA (diabetic ketoacidoses) ([place]) 06/28/2020  . Systolic and diastolic CHF, chronic ([place]) 06/28/2020  . Anemia associated with acute blood loss   . Dehydration 06/27/2020  . Protein-calorie malnutrition, severe ([place]) 06/09/2020  . Hyperglycemia due to diabetes mellitus ([place])   . Other pancytopenia ([place])   . Acute combined systolic and diastolic heart failure ([place])   . Cardiogenic shock ([place])   . Altered mental status   . Failure to thrive in adult   . Symptomatic anemia   . Acute respiratory failure ([place])   . Increased anion gap metabolic acidosis 59/56/3875  . Goals of care, counseling/discussion 03/28/2020  . Encounter for antineoplastic chemotherapy 03/28/2020  . GERD (gastroesophageal reflux disease) 05/13/2014  . History of colonic polyps 05/13/2014  . Hiatal hernia 05/13/2014  . CLL (chronic lymphocytic leukemia) ([place]) 10/25/2013  . ED (erectile dysfunction) 10/20/2013  . Lymphocytosis 09/23/2013  . Essential hypertension 07/21/2013  . Pure hypercholesterolemia  07/21/2013  . Diabetes mellitus ([place]) 07/20/2013    Palliative Care Assessment & Plan    Recommendations/Plan:  Plan to continue to treat his treatable conditions this admission.  Family would like to continue treatment for current infection and medically maximize him prior to discharge.  When he does discharge home, plan is for transition home with the support of hospice.  [facility] also now following inpatient and will continue to coordinate plans for discharge home with hospice with them based upon his clinical course of the next few days.  Code Status:    Code Status Orders  (From admission, onward)         Start     Ordered   08/27/20 0059  Limited resuscitation (code)  Continuous       Question Answer Comment  In the event of cardiac or respiratory ARREST: Initiate Code Blue, Call Rapid Response Yes   In the event of cardiac or respiratory ARREST: Perform CPR No   In the event of cardiac or respiratory ARREST: Perform Intubation/Mechanical Ventilation Yes   In the event of cardiac or respiratory ARREST: Use NIPPV/BiPAp only if indicated Yes   In the event of cardiac or respiratory ARREST: Administer ACLS medications if indicated No   In the event of cardiac or respiratory ARREST: Perform Defibrillation or Cardioversion if indicated No      08/27/20 0059        Code Status History    Date Active Date Inactive Code Status Order ID Comments User Context   07/20/2020 1532 07/21/2020 2120 DNR [id number]  Earlie Counts, NP Inpatient   07/19/2020 1949 07/20/2020 1532 Full Code [id number]  Clarnce Flock, MD ED   06/28/2020 1122 07/11/2020 1752 Full Code [id number]  Donne Hazel, MD ED   06/05/2020 1839 06/21/2020 0024 Full Code [id number]  Cherene Altes, MD ED   Advance Care Planning Activity       Prognosis:  Guarded  Discharge Planning:  Home with Hospice once medically ready  Care plan was discussed with patient, wife, hospice liaison  Thank you for allowing the Palliative Medicine Team to assist in the care of this patient.   Time In: 1600 Time  Out: 1625 Total Time 25 Prolonged Time Billed  No      Greater than 50%  of this time was spent counseling and coordinating care related to the above assessment and plan.  Micheline Rough, MD  Please contact Palliative Medicine Team phone at [phone] for questions and concerns.

## 2020-09-09 NOTE — Progress Notes (Signed)
    NAME:  Tony Marshall, MRN:  [id number], DOB:  Apr 23, 1941, LOS: 56 ADMISSION DATE:  09/08/2020, CONSULTATION DATE:  09/09/20 REFERRING MD:  EDP, CHIEF COMPLAINT:  lethargy  Brief History   78 yowm, never smoker, with history of HFrEF and CLL s/p three cycles chemo and rx avelox since 8/27 for PNA who presented with lethargy and poor PO intake.  Found to have worsening bibasilar airspace disease and pleural effusions with hypotension despite 3.5L IVF. PCCM consulted for admission. Labs were significant for WBC 3.3, Hgb 6.1 down from baseline of 9 several weeks ago, Lactic acid 3.2, Covid-19 negative. Course complicated by right pneumothorax after right IJ attempt.  Past Medical History  Atrial fibrillation not on anticoagulation HFrEF CLL s/p 3 cycles chemo DM HTN GERD OSA  Significant Hospital Events   9/11 Admit to PCCM 9/15 weaned off vasopressors ,increasing delirium, pulled out his central line  9/17 transfer to [facility], PCCM following for chest tube 9/20 Residual chest tube air leak 1/7 9/25 larger Rt pneumothorax, change suction to -30 cm water  Consults:  Palliative care  Procedures:  9/12 RIJ attempted, R pigtail chest tube by ED >> 9/16 removed since non functional 9/12 L Fem CVL >> 9/14 , pulled out 9/16 RT pigtail >>  Significant Diagnostic Tests:     Micro Data:  Sars-Cov-2 PCR 9/11 > negative Blood Cx  9/12 > negative  Antimicrobials:  Zozyn 9/11 only Vancomycin 9/11 >> 9/13 Cefepime 9/12 >> 9/18   Interim history/subjective:  More short of breath this morning.  Feels anxious.  Objective   Blood pressure 130/83, pulse (!) 105, temperature 98.3 F (36.8 C), temperature source Oral, resp. rate 15, height 5' 7.5" (1.715 m), weight 54.2 kg, SpO2 95 %.        Intake/Output Summary (Last 24 hours) at 09/09/2020 4128 Last data filed at 09/09/2020 0600 Gross per 24 hour  Intake 1700.36 ml  Output 750 ml  Net 950.36 ml   Exam:   General -  cachectic Eyes - pupils reactive ENT - no sinus tenderness, no stridor Cardiac - regular rate/rhythm, no murmur Chest - absent breath sounds on Rt, faint crackles on Lt, Rt chest tube in place and did not appear to have any problems with set up on inspection of tube and equipment Abdomen - soft, non tender, + bowel sounds Extremities - decreased muscle bulk Skin - no rashes Neuro - follows commands appropriately     Resolved Hospital Problems  AKI, metabolic acidosis without anion gap, very mild lactic acidosis:  Septic shock Pneumonia, possible aspiration pneumonia  Assessment & Plan:   Iatrogenic Rt pneumothorax. - s/p chest tube 9/12 and replaced 9/16 significantly increased Rt pneumothorax on 9/25 - tube and equipment seem properly set up on inspection 9/25 - will increase chest tube suction to -30 cm H2O and repeat CXR later this morning - if pneumothorax persists, then might need to place additional chest tube  Anxiety. - ordered prn xanax  Goals of care. - working with palliative care  Updated pt's wife at bedside.  Signature:  Chesley Mires, MD [place] Pager - [phone] 09/09/2020, 8:22 AM

## 2020-09-09 NOTE — TOC Progression Note (Signed)
Transition of Care [facility]) - Progression Note    Patient Details  Name: Tony Marshall MRN: [id number] Date of Birth: Dec 08, 1941  [facility] CM/SW Contact  Shade Flood, LCSW Phone Number: 09/09/2020, 9:20 AM  Clinical Narrative:     TOC received consult stating pt and family desire dc home with hospice when stable and they would like [facility]. Pt not yet stable for dc per MD.   Notified on call RN with [facility] of the referral and unknown dc timeframe. [facility] will follow up.  TOC will follow.  Expected Discharge Plan: Home w Hospice Care Barriers to Discharge: Continued Medical Work up  Expected Discharge Plan and Services Expected Discharge Plan: [place] In-house Referral: Clinical Social Work Discharge Planning Services: CM Consult Post Acute Care Choice: Hospice Living arrangements for the past 2 months: Single Family Home                                       Social Determinants of Health (SDOH) Interventions    Readmission Risk Interventions Readmission Risk Prevention Plan 09/09/2020 07/21/2020  Transportation Screening Complete Complete  Medication Review Press photographer) Complete Complete  PCP or Specialist appointment within 3-5 days of discharge - Complete  HRI or [place] - Complete  SW Recovery Care/Counseling Consult Complete Complete  Palliative Care Screening Complete Complete  [place] Not Applicable Patient Refused  Some recent data might be hidden

## 2020-09-09 NOTE — Evaluation (Signed)
Clinical/Bedside Swallow Evaluation Patient Details  Name: Tony Marshall MRN: [id number] Date of Birth: 1941-04-21  Today's Date: 09/09/2020 Time: SLP Start Time (ACUTE ONLY): 67 SLP Stop Time (ACUTE ONLY): 1730 SLP Time Calculation (min) (ACUTE ONLY): 20 min  Past Medical History:  Past Medical History:  Diagnosis Date  . A-fib ([place])   . CLL (chronic lymphocytic leukemia) ([place]) dx'd 2014   progression 12/2019  . Diabetes mellitus without complication ([place])   . Diverticulosis   . External hemorrhoids   . GERD (gastroesophageal reflux disease)   . Hiatal hernia   . History of colon polyps   . Hyperlipidemia   . Hypertension   . Internal hemorrhoids   . Lymphocytosis   . Schatzki's ring   . Sleep apnea    Past Surgical History:  Past Surgical History:  Procedure Laterality Date  . BLEPHAROPLASTY    . COLONOSCOPY  2012  . FLEXIBLE SIGMOIDOSCOPY N/A 07/05/2020   Procedure: FLEXIBLE SIGMOIDOSCOPY;  Surgeon: Thornton Park, MD;  Location: [facility] ENDOSCOPY;  Service: Gastroenterology;  Laterality: N/A;  . HEMORRHOID SURGERY     Sclerotherapy  . KNEE SURGERY Right 2002  . SHOULDER SURGERY Right [id number]   HPI:  79 year old male with PMH of chronic systolic CHF, CLL on chemotherapy presented with shock and pneumonia.  He developed large right-sided iatrogenic pneumothorax, now status post chest tube and PCCM continue to follow.  Hospital course complicated by ongoing poor oral intake, failure to thrive, persisting right pneumothorax, new sepsis 9/24 likely related to recurrent aspiration pneumonia.   Assessment / Plan / Recommendation Clinical Impression  Patient's swallow function does not appear significantly changed as compared to recent past BSE on 9/12. He drank straw sips of water but did not consume any solid PO's. His wife is present in room and she reports he continues with very poor intake, persistent coughing in absence of PO intake and she and patient are both at a loss  for what is causing this. Patient is suspected to be having aspiration PNA. Plan to complete MBS to r/o aspiration. SLP educated patient and wife on rationale for MBS and what the test can and cannot help determine. SLP Visit Diagnosis: Dysphagia, unspecified (R13.10)    Aspiration Risk  Moderate aspiration risk    Diet Recommendation Regular;Thin liquid   Liquid Administration via: Cup;Straw Medication Administration: Whole meds with puree Supervision: Patient able to self feed Compensations: Slow rate;Small sips/bites Postural Changes: Seated upright at 90 degrees;Remain upright for at least 30 minutes after po intake    Other  Recommendations Oral Care Recommendations: Oral care BID   Follow up Recommendations None      Frequency and Duration min 1 x/week          Prognosis Prognosis for Safe Diet Advancement: Fair      Swallow Study   General Date of Onset: 08/27/20 HPI: 79 year old male with PMH of chronic systolic CHF, CLL on chemotherapy presented with shock and pneumonia.  He developed large right-sided iatrogenic pneumothorax, now status post chest tube and PCCM continue to follow.  Hospital course complicated by ongoing poor oral intake, failure to thrive, persisting right pneumothorax, new sepsis 9/24 likely related to recurrent aspiration pneumonia. Type of Study: Bedside Swallow Evaluation Previous Swallow Assessment: BSE on 9/12 Diet Prior to this Study: Regular;Thin liquids Temperature Spikes Noted: No Respiratory Status: Room air History of Recent Intubation: No Behavior/Cognition: Alert;Cooperative Oral Cavity Assessment: Within Functional Limits Oral Care Completed by SLP: No Oral Cavity - Dentition:  Adequate natural dentition Vision: Functional for self-feeding Self-Feeding Abilities: Able to feed self Patient Positioning: Upright in bed Baseline Vocal Quality: Low vocal intensity Volitional Cough: Weak    Oral/Motor/Sensory Function Overall Oral  Motor/Sensory Function: Within functional limits   Ice Chips     Thin Liquid Thin Liquid: Within functional limits Presentation: [place], MA, CCC-SLP Speech Therapy

## 2020-09-09 NOTE — Progress Notes (Signed)
DG Chest Port 1 View  Result Date: 09/09/2020 CLINICAL DATA:  Follow-up chest tube and pneumothorax EXAM: PORTABLE CHEST 1 VIEW COMPARISON:  09/09/20 FINDINGS: Cardiac shadow is stable in appearance. Aortic calcifications are seen. Pigtail catheter is again seen on the right with near complete resolution of previously seen right-sided pneumothorax. Small right-sided pleural effusion is noted. Right basilar infiltrate remains. Persistent infiltrative change in left base is noted. IMPRESSION: Near complete resolution of right-sided pneumothorax. Persistent bilateral opacities with effusions. Electronically Signed   By: Inez Catalina M.D.   On: 09/09/2020 13:57   DG Chest Port 1 View  Result Date: 09/09/2020 CLINICAL DATA:  Follow-up pneumothorax. EXAM: PORTABLE CHEST 1 VIEW COMPARISON:  09/08/2020 FINDINGS: The RIGHT pneumothorax is now very large with complete collapse/atelectasis of the RIGHT lung. There is no definite midline shift. A RIGHT thoracostomy tube is again noted. LEFT lung airspace disease and LEFT LOWER lung atelectasis/consolidation again noted. No other interval change identified. IMPRESSION: Large RIGHT pneumothorax with complete collapse/atelectasis of the RIGHT lung. RIGHT thoracostomy tube remains in place. No definite midline shift. LEFT lung airspace disease and LEFT LOWER lung atelectasis/consolidation again noted. Critical Value/emergent results were called by telephone at the time of interpretation on 09/09/2020 at 7:36 am to provider Gregary Signs, nurse for this patient, who verbally acknowledged these results. Electronically Signed   By: Margarette Canada M.D.   On: 09/09/2020 07:38    Follow up chest xray shows near complete resolution of Rt pneumothorax after increasing suction to -30 cm water.  Chesley Mires, MD [place] Pager - [phone] 09/09/2020, 2:49 PM

## 2020-09-09 NOTE — Progress Notes (Signed)
PROGRESS NOTE   Tony Marshall  CWC:[id number]    DOB: March 09, 1941    DOA: 09/13/2020  PCP: Timoteo Gaul, FNP   I have briefly reviewed patients previous medical records in [facility].  Chief Complaint  Patient presents with  . Hypotension  . Weakness    Brief Narrative:  79 year old male with PMH of chronic systolic CHF, CLL on chemotherapy presented with shock and pneumonia.  He developed large right-sided iatrogenic pneumothorax, now status post chest tube and PCCM continue to follow.  Hospital course complicated by ongoing poor oral intake, failure to thrive, persisting right pneumothorax, new sepsis 9/24 likely related to recurrent aspiration pneumonia.   Assessment & Plan:  Active Problems:   CLL (chronic lymphocytic leukemia) (HCC)   Acute respiratory failure (HCC)   Other pancytopenia (HCC)   Acute combined systolic and diastolic heart failure (HCC)   Shock circulatory (HCC)   Septic shock (HCC)   Iatrogenic pneumothorax on R    AKI (acute kidney injury) ([place])   Pressure injury of skin   Pneumothorax on right   Pneumonia due to infectious organism   Severe protein-calorie malnutrition (HCC)   Hypokalemia   Hypomagnesemia   Hypophosphatemia   Acute metabolic encephalopathy  Sepsis/febrile neutropenia, recurrent aspiration pneumonia: Developed new sepsis on 9/24, likely due to recurrent aspiration pneumonia.  Patient/family wish to pursue treat what is treatable.  Sepsis protocol initiated, IVF, IV cefepime and vancomycin.  Lactate normal.  Procalcitonin elevated.  Clinically better today, fevers down, tachycardia improved and patient alert and responsive.  Chest x-ray shows large right PTX with complete collapse/atelectasis of the right lung without midline shift, right chest tube in place.  Left lung ASD and left lower lung atelectasis/consolidation again noted.  SLP swallow eval pending, had evaluated him earlier when they had no concerns.  Acute  respiratory failure with hypoxia: Noted on 9/24 due to pneumonia, AMS.  Treat as above and oxygen support.  Currently on 2 L/min [place] oxygen and saturating at 95%.  Septic shock secondary to pneumonia and hypovolemia from poor oral intake, POA: Shock resolved.  Has been off of vasopressors since 9/15.  Had completed 7 days of IV cefepime.  Please see above, antibiotics reinitiated 9/24.  Iatrogenic right-sided pneumothorax PCCM continues to follow.  S/p chest tube on 9/12, replaced 9/16, repeat chest x-ray 9/25 shows worsened/large right pneumothorax without midline shift.  CCM is increased chest tube suction to -30 cmH2O and plan to repeat chest x-ray later this morning and if pneumothorax persists then may need to place additional chest tube.  Acute kidney injury/metabolic acidosis: S/p bicarbonate drip.  Resolved.  Type II DM with hyperglycemia A1c 5.8 on 9/13.  Noted to have hypoglycemic episodes early this morning.  Likely related to poor oral intake.  Discontinued Lantus and changed SSI to sensitive.  No further hypoglycemic episodes.  CBGs reasonable.  Hypophosphatemia/hypomagnesemia/hypokalemia: All replaced.  Acute metabolic encephalopathy Likely multifactorial.  Avoid sedatives.  Delirium precautions.  Better today, alert and interactive.  CLL: S/p 3 cycles chemotherapy.  Outpatient follow-up with oncology.  Pancytopenia: Likely due to sepsis/acute illness causing bone marrow suppression.  Hemoglobin stable.  Platelets improving/128.  Progressive leukopenia/neutropenia.  Oncology seen 9/24.  Neutropenia may be due to bone marrow suppression from recent septic shock, current infection and are giving Granix 300 mcg subcutaneously x2 doses.  Transfuse PRBC if hemoglobin 7 g or less and platelets if platelet count 20 K no rectal bleeding.  WBC up to 0.7.  Hyponatremia: Mild  GOC: PMT did family meeting on 9/24.  They would like treatable conditions treated, especially concerned  about fever today and would like any suspected infection treated prior to DC home with hospice and then does not want to come back to the hospital once he leaves.  Continue Granix, CODE STATUS changed to partial code/no CPR.  Pressure Injury 08/27/20 Sacrum Medial Stage 2 -  Partial thickness loss of dermis presenting as a shallow open injury with a red, pink wound bed without slough. Red area with small area of broken skin in center (Active)  08/27/20 1024  Location: Sacrum  Location Orientation: Medial  Staging: Stage 2 -  Partial thickness loss of dermis presenting as a shallow open injury with a red, pink wound bed without slough.  Wound Description (Comments): Red area with small area of broken skin in center  Present on Admission: Yes     Pressure Injury 08/27/20 Nose Left Deep Tissue Pressure Injury - Purple or maroon localized area of discolored intact skin or blood-filled blister due to damage of underlying soft tissue from pressure and/or shear. (Active)  08/27/20 1024  Location: Nose  Location Orientation: Left  Staging: Deep Tissue Pressure Injury - Purple or maroon localized area of discolored intact skin or blood-filled blister due to damage of underlying soft tissue from pressure and/or shear.  Wound Description (Comments):   Present on Admission: Yes    Body mass index is 18.44 kg/m.  Nutritional Status Nutrition Problem: Severe Malnutrition Etiology: chronic illness, cancer and cancer related treatments Signs/Symptoms: moderate fat depletion, moderate muscle depletion, severe fat depletion, severe muscle depletion Interventions: Magic cup, Tube feeding, Other (Comment) ([employer] TF)  Patient continues to have poor oral intake.  On Remeron.  Diarrhea: Has rectal tube with liquid green stools.  Low index of suspicion for C. difficile.  May be related to recent infection and antibiotic exposure.  Trial of probiotics.  Adult failure to thrive: Multifactorial due to  advanced age, CLL, multiple severe comorbidities and acute illness listed above.  Reportedly not eating well even prior to recurrence of CLL and even worse after chemotherapy initiation.  Daughter also thinks that patient may be quite depressed.    DVT prophylaxis: Place and maintain sequential compression device Start: 08/27/20 0918 SCDs Start: 08/27/20 0058     Code Status: Partial Code  Family Communication: Discussed in detail with patient's spouse at bedside.  Updated care and answered questions. Disposition:  Status is: Inpatient  Remains inpatient appropriate because:Inpatient level of care appropriate due to severity of illness   Dispo: The patient is from: Home              Anticipated d/c is to: TBD              Anticipated d/c date is: > 3 days              Patient currently is not medically stable to d/c.        Consultants:     Procedures:   9/12 RIJ attempted, R pigtail chest tube by ED >> 9/16 removed since non functional 9/12 L Fem CVL >> 9/14 , pulled out 9/16 RT pigtail >>   Antimicrobials:    Anti-infectives (From admission, onward)   Start     Dose/Rate Route Frequency Ordered Stop   09/09/20 1800  ceFEPIme (MAXIPIME) 2 g in sodium chloride 0.9 % 100 mL IVPB        2 g 200 mL/hr over  30 Minutes Intravenous Every 12 hours 09/09/20 0907     09/09/20 0400  vancomycin (VANCOREADY) IVPB 500 mg/100 mL        500 mg 100 mL/hr over 60 Minutes Intravenous Every 12 hours 09/08/20 1456     09/08/20 2200  ceFEPIme (MAXIPIME) 2 g in sodium chloride 0.9 % 100 mL IVPB  Status:  Discontinued        2 g 200 mL/hr over 30 Minutes Intravenous Every 8 hours 09/08/20 1456 09/09/20 0907   09/08/20 1600  vancomycin (VANCOCIN) IVPB 1000 mg/200 mL premix        1,000 mg 200 mL/hr over 60 Minutes Intravenous  Once 09/08/20 1447 09/08/20 1811   09/08/20 1515  ceFEPIme (MAXIPIME) 2 g in sodium chloride 0.9 % 100 mL IVPB        2 g 200 mL/hr over 30 Minutes Intravenous  Once  09/08/20 1447 09/08/20 1556   08/27/20 2200  vancomycin (VANCOREADY) IVPB 500 mg/100 mL  Status:  Discontinued        500 mg 100 mL/hr over 60 Minutes Intravenous Every 24 hours 08/27/20 0136 08/28/20 1133   08/27/20 0200  ceFEPIme (MAXIPIME) 2 g in sodium chloride 0.9 % 100 mL IVPB        2 g 200 mL/hr over 30 Minutes Intravenous Every 12 hours 08/27/20 0136 09/02/20 1521   09/09/2020 1930  vancomycin (VANCOCIN) IVPB 1000 mg/200 mL premix        1,000 mg 200 mL/hr over 60 Minutes Intravenous  Once 08/19/2020 1927 09/10/2020 2305   08/24/2020 1930  piperacillin-tazobactam (ZOSYN) IVPB 3.375 g        3.375 g 100 mL/hr over 30 Minutes Intravenous  Once 08/29/2020 1927 08/28/2020 2154        Subjective:  Worsening dyspnea and chest pain with coughing overnight.  Ongoing poor appetite.   Objective:   Vitals:   09/08/20 1740 09/08/20 2006 09/09/20 0145 09/09/20 0432  BP: 114/80 122/81  130/83  Pulse: 96 (!) 103  (!) 105  Resp: 18 16  15   Temp: 98.1 F (36.7 C) 97.9 F (36.6 C) 99.7 F (37.6 C) 98.3 F (36.8 C)  TempSrc: Oral Oral Oral Oral  SpO2: 100% 96%  95%  Weight:      Height:        General exam: Elderly male, moderately built and frail, chronically ill looking, looks better than he did yesterday morning. Respiratory system: Diminished breath sounds in right lung fields.  Left basal crackles.  No wheezing or rhonchi.  No increased work of breathing. Cardiovascular system: S1 & S2 heard, RRR. No JVD, murmurs, rubs, gallops or clicks. No pedal edema.  Telemetry personally reviewed: Sinus rhythm in the 90s. Gastrointestinal system: Abdomen is nondistended, soft and nontender. No organomegaly or masses felt. Normal bowel sounds heard.  Has rectal tube with liquid green stools. Central nervous system: Alert and oriented x2.  No focal neurological deficits. Extremities: Symmetric 5 x 5 power. Skin: No rashes, lesions or ulcers Psychiatry: Judgement and insight impaired. Mood & affect  slightly anxious.    Data Reviewed:   I have personally reviewed following labs and imaging studies   CBC: Recent Labs  Lab 09/07/20 0747 09/08/20 0543 09/09/20 0421  WBC 0.6* 0.4* 0.7*  NEUTROABS 0.2* 0.1* 0.1*  HGB 9.1* 8.4* 9.2*  HCT 27.9* 26.3* 29.1*  MCV 86.4 88.0 89.0  PLT 77* 82* 128*    Basic Metabolic Panel: Recent Labs  Lab 09/07/20 0747 09/08/20  0543 09/09/20 0421  NA 136 135 134*  K 3.0* 3.2* 4.4  CL 92* 93* 94*  CO2 33* 30 26  GLUCOSE 125* 99 160*  BUN 20 20 29*  CREATININE 0.64 0.58* 0.89  CALCIUM 8.2* 7.7* 8.1*  MG 1.7 1.6* 2.3  PHOS 1.8* 3.0 3.6    Liver Function Tests: Recent Labs  Lab 09/04/20 0425 09/04/20 0425 09/07/20 0747 09/08/20 0543 09/09/20 0421  AST 32  --   --   --   --   ALT 9  --   --   --   --   ALKPHOS 50  --   --   --   --   BILITOT 0.9  --   --   --   --   PROT 4.0*  --   --   --   --   ALBUMIN 2.3*   < > 2.2* 2.1* 2.3*   < > = values in this interval not displayed.    CBG: Recent Labs  Lab 09/08/20 2353 09/09/20 0429 09/09/20 0739  GLUCAP 117* 148* 193*    Microbiology Studies:  No results found for this or any previous visit (from the past 240 hour(s)).   Radiology Studies:  DG Chest Port 1 View  Result Date: 09/09/2020 CLINICAL DATA:  Follow-up pneumothorax. EXAM: PORTABLE CHEST 1 VIEW COMPARISON:  09/08/2020 FINDINGS: The RIGHT pneumothorax is now very large with complete collapse/atelectasis of the RIGHT lung. There is no definite midline shift. A RIGHT thoracostomy tube is again noted. LEFT lung airspace disease and LEFT LOWER lung atelectasis/consolidation again noted. No other interval change identified. IMPRESSION: Large RIGHT pneumothorax with complete collapse/atelectasis of the RIGHT lung. RIGHT thoracostomy tube remains in place. No definite midline shift. LEFT lung airspace disease and LEFT LOWER lung atelectasis/consolidation again noted. Critical Value/emergent results were called by telephone  at the time of interpretation on 09/09/2020 at 7:36 am to provider Gregary Signs, nurse for this patient, who verbally acknowledged these results. Electronically Signed   By: Margarette Canada M.D.   On: 09/09/2020 07:38   DG Chest Port 1 View  Result Date: 09/08/2020 CLINICAL DATA:  Pneumothorax. EXAM: PORTABLE CHEST 1 VIEW COMPARISON:  09/07/2020. FINDINGS: Right chest tube in stable position. Stable small right pneumothorax. Heart size normal. Multifocal bilateral pulmonary infiltrates are again noted. Small bilateral pleural effusions again noted. Degenerative change thoracic spine. IMPRESSION: 1. Right chest tube in stable position. Small right apical pneumothorax again noted without interim change. 2. Multifocal bilateral pulmonary infiltrates are again noted. Similar findings on prior exam. Small bilateral pleural effusions again noted. Electronically Signed   By: Morse   On: 09/08/2020 07:47     Scheduled Meds:   . Chlorhexidine Gluconate Cloth  6 each Topical Daily  . insulin aspart  0-9 Units Subcutaneous TID WC  . mouth rinse  15 mL Mouth Rinse BID  . mirtazapine  7.5 mg Oral QHS  . multivitamin with minerals  1 tablet Oral Daily  . pantoprazole  40 mg Oral Daily  . saccharomyces boulardii  250 mg Oral BID  . sodium chloride flush  10 mL Intracatheter Q8H  . sodium chloride flush  10-40 mL Intracatheter Q12H  . Tbo-filgrastim (GRANIX) injection [facility]  300 mcg Subcutaneous q1800    Continuous Infusions:   . sodium chloride 10 mL/hr at 09/01/20 1800  . 0.9 % NaCl with KCl 20 mEq / L 50 mL/hr at 09/09/20 0954  . ceFEPime (MAXIPIME) IV    .  vancomycin 500 mg (09/09/20 0459)     LOS: 13 days     Vernell Leep, MD, [place], [facility]. [facility]    To contact the attending provider between 7A-7P or the covering provider during after hours 7P-7A, please log into the web site [email or web address] and access using universal [place] password for that web site. If you do not have the  password, please call the hospital operator.  09/09/2020, 11:17 AM

## 2020-09-09 NOTE — Progress Notes (Signed)
PHARMACY - PHYSICIAN COMMUNICATION CRITICAL VALUE ALERT - BLOOD CULTURE IDENTIFICATION (BCID)  Tony Marshall is an 79 y.o. male who presented to [facility] on 09/04/2020 with a chief complaint of hypotension, weakness  Assessment:  Patient with 1 of 4 bottles from blood cx's growing pseudomonas  Name of physician (or Provider) Contacted: [place]  Current antibiotics: vanc/cefepime  Changes to prescribed antibiotics recommended:  D/c vanc and continue cefepime  Results for orders placed or performed during the hospital encounter of 08/16/2020  Blood Culture ID Panel (Reflexed) (Collected: 09/08/2020  3:03 PM)  Result Value Ref Range   Enterococcus faecalis NOT DETECTED NOT DETECTED   Enterococcus Faecium NOT DETECTED NOT DETECTED   Listeria monocytogenes NOT DETECTED NOT DETECTED   Staphylococcus species NOT DETECTED NOT DETECTED   Staphylococcus aureus (BCID) NOT DETECTED NOT DETECTED   Staphylococcus epidermidis NOT DETECTED NOT DETECTED   Staphylococcus lugdunensis NOT DETECTED NOT DETECTED   Streptococcus species NOT DETECTED NOT DETECTED   Streptococcus agalactiae NOT DETECTED NOT DETECTED   Streptococcus pneumoniae NOT DETECTED NOT DETECTED   Streptococcus pyogenes NOT DETECTED NOT DETECTED   A.calcoaceticus-baumannii NOT DETECTED NOT DETECTED   Bacteroides fragilis NOT DETECTED NOT DETECTED   Enterobacterales NOT DETECTED NOT DETECTED   Enterobacter cloacae complex NOT DETECTED NOT DETECTED   Escherichia coli NOT DETECTED NOT DETECTED   Klebsiella aerogenes NOT DETECTED NOT DETECTED   Klebsiella oxytoca NOT DETECTED NOT DETECTED   Klebsiella pneumoniae NOT DETECTED NOT DETECTED   Proteus species NOT DETECTED NOT DETECTED   Salmonella species NOT DETECTED NOT DETECTED   Serratia marcescens NOT DETECTED NOT DETECTED   Haemophilus influenzae NOT DETECTED NOT DETECTED   Neisseria meningitidis NOT DETECTED NOT DETECTED   Pseudomonas aeruginosa DETECTED (A) NOT DETECTED    Stenotrophomonas maltophilia NOT DETECTED NOT DETECTED   Candida albicans NOT DETECTED NOT DETECTED   Candida auris NOT DETECTED NOT DETECTED   Candida glabrata NOT DETECTED NOT DETECTED   Candida krusei NOT DETECTED NOT DETECTED   Candida parapsilosis NOT DETECTED NOT DETECTED   Candida tropicalis NOT DETECTED NOT DETECTED   Cryptococcus neoformans/gattii NOT DETECTED NOT DETECTED   CTX-M ESBL NOT DETECTED NOT DETECTED   Carbapenem resistance IMP NOT DETECTED NOT DETECTED   Carbapenem resistance KPC NOT DETECTED NOT DETECTED   Carbapenem resistance NDM NOT DETECTED NOT DETECTED   Carbapenem resistance VIM NOT DETECTED NOT DETECTED    Kara Mead 09/09/2020  12:58 PM

## 2020-09-09 NOTE — Progress Notes (Signed)
CRITICAL VALUE ALERT  Critical Value:  WBC 0.7  Date & Time Notied:  09/09/20@0626   Provider Notified: Yes  Orders Received/Actions taken:

## 2020-09-10 ENCOUNTER — Inpatient Hospital Stay (HOSPITAL_COMMUNITY): Payer: Medicare HMO

## 2020-09-10 LAB — CBC WITH DIFFERENTIAL/PLATELET
Abs Immature Granulocytes: 0 10*3/uL (ref 0.00–0.07)
Basophils Absolute: 0 10*3/uL (ref 0.0–0.1)
Basophils Relative: 0 %
Eosinophils Absolute: 0 10*3/uL (ref 0.0–0.5)
Eosinophils Relative: 0 %
HCT: 27.9 % — ABNORMAL LOW (ref 39.0–52.0)
Hemoglobin: 8.9 g/dL — ABNORMAL LOW (ref 13.0–17.0)
Immature Granulocytes: 0 %
Lymphocytes Relative: 56 %
Lymphs Abs: 0.3 10*3/uL — ABNORMAL LOW (ref 0.7–4.0)
MCH: 27.8 pg (ref 26.0–34.0)
MCHC: 31.9 g/dL (ref 30.0–36.0)
MCV: 87.2 fL (ref 80.0–100.0)
Monocytes Absolute: 0.1 10*3/uL (ref 0.1–1.0)
Monocytes Relative: 23 %
Neutro Abs: 0.1 10*3/uL — ABNORMAL LOW (ref 1.7–7.7)
Neutrophils Relative %: 21 %
Platelets: 106 10*3/uL — ABNORMAL LOW (ref 150–400)
RBC: 3.2 MIL/uL — ABNORMAL LOW (ref 4.22–5.81)
RDW: 21.4 % — ABNORMAL HIGH (ref 11.5–15.5)
WBC: 0.5 10*3/uL — CL (ref 4.0–10.5)
nRBC: 0 % (ref 0.0–0.2)

## 2020-09-10 LAB — MAGNESIUM: Magnesium: 2.3 mg/dL (ref 1.7–2.4)

## 2020-09-10 LAB — BASIC METABOLIC PANEL
Anion gap: 14 (ref 5–15)
BUN: 41 mg/dL — ABNORMAL HIGH (ref 8–23)
CO2: 24 mmol/L (ref 22–32)
Calcium: 8 mg/dL — ABNORMAL LOW (ref 8.9–10.3)
Chloride: 100 mmol/L (ref 98–111)
Creatinine, Ser: 1.33 mg/dL — ABNORMAL HIGH (ref 0.61–1.24)
GFR calc Af Amer: 60 mL/min — ABNORMAL LOW (ref >60)
GFR calc non Af Amer: 52 mL/min — ABNORMAL LOW (ref >60)
Glucose, Bld: 137 mg/dL — ABNORMAL HIGH (ref 70–99)
Potassium: 4.4 mmol/L (ref 3.5–5.1)
Sodium: 138 mmol/L (ref 135–145)

## 2020-09-10 LAB — GLUCOSE, CAPILLARY
Glucose-Capillary: 145 mg/dL — ABNORMAL HIGH (ref 70–99)
Glucose-Capillary: 150 mg/dL — ABNORMAL HIGH (ref 70–99)
Glucose-Capillary: 138 mg/dL — ABNORMAL HIGH (ref 70–99)
Glucose-Capillary: 151 mg/dL — ABNORMAL HIGH (ref 70–99)
Glucose-Capillary: 137 mg/dL — ABNORMAL HIGH (ref 70–99)

## 2020-09-10 LAB — URINE CULTURE

## 2020-09-10 MED ORDER — LOPERAMIDE HCL 2 MG PO CAPS
2.0000 mg | ORAL_CAPSULE | Freq: Once | ORAL | Status: AC
  Administered 2020-09-10: 2 mg via ORAL
  Filled 2020-09-10: qty 1

## 2020-09-10 MED ORDER — POTASSIUM CHLORIDE IN NACL 20-0.9 MEQ/L-% IV SOLN
INTRAVENOUS | Status: DC
  Filled 2020-09-10 (×2): qty 1000

## 2020-09-10 MED ORDER — MIRTAZAPINE 15 MG PO TABS
15.0000 mg | ORAL_TABLET | Freq: Every day | ORAL | Status: DC
  Filled 2020-09-10: qty 1

## 2020-09-10 NOTE — Progress Notes (Signed)
Modified Barium Swallow Progress Note  Patient Details  Name: KOHEI ANTONELLIS MRN: [id number] Date of Birth: 03-30-1941  Today's Date: 09/10/2020  Modified Barium Swallow completed.  Full report located under Chart Review in the Imaging Section.  Brief recommendations include the following:  Clinical Impression  Patient presents with a mild oropharygneal dysphagia and suspected esophageal component as per patient and wife's report. Patient exhbited mild delay in anterior to posterior propulsion of thin liquids and pureer solids in oral cavity. Pharyngeally, swallow function was [facility], with timely swallow initiation, epiglottic deflection with good airway protection, no instances of penetration or aspiration and no residuals in vallecular sinus or posterior pharyngeal wall. Patient did present with residuals in pyriform sinus which did not fully transit during MBS. Cannot r/o aspiration or penetration from pyriform residuals.   Swallow Evaluation Recommendations       SLP Diet Recommendations: Thin liquid;Regular solids   Liquid Administration via: Straw;Cup   Medication Administration: Crushed with puree   Supervision: Full supervision/cueing for compensatory strategies;Staff to assist with self feeding   Compensations: Slow rate;Small sips/bites;Follow solids with liquid   Postural Changes: Remain semi-upright after after feeds/meals (Comment);Seated upright at 90 degrees   Oral Care Recommendations: Oral care BID       Sonia Baller, MA, CCC-SLP Speech Therapy

## 2020-09-10 NOTE — Progress Notes (Signed)
NAME:  Tony Marshall, MRN:  [id number], DOB:  [birth year], LOS: 49 ADMISSION DATE:  09/05/2020, CONSULTATION DATE:  09/10/20 REFERRING MD:  EDP, CHIEF COMPLAINT:  lethargy  Brief History   78 yowm, never smoker, with history of HFrEF and CLL s/p three cycles chemo and rx avelox since 8/27 for PNA who presented with lethargy and poor PO intake.  Found to have worsening bibasilar airspace disease and pleural effusions with hypotension despite 3.5L IVF. PCCM consulted for admission. Labs were significant for WBC 3.3, Hgb 6.1 down from baseline of 9 several weeks ago, Lactic acid 3.2, Covid-19 negative. Course complicated by right pneumothorax after right IJ attempt.  Past Medical History  Atrial fibrillation not on anticoagulation HFrEF CLL s/p 3 cycles chemo DM HTN GERD OSA  Significant Hospital Events   9/11 Admit to PCCM 9/15 weaned off vasopressors ,increasing delirium, pulled out his central line  9/17 transfer to [facility], PCCM following for chest tube 9/20 Residual chest tube air leak 1/7 9/25 larger Rt pneumothorax, change suction to -30 cm water  Consults:  Palliative care  Procedures:  9/12 RIJ attempted, R pigtail chest tube by ED >> 9/16 removed since non functional 9/12 L Fem CVL >> 9/14 , pulled out 9/16 RT pigtail >>  Significant Diagnostic Tests:     Micro Data:  Sars-Cov-2 PCR 9/11 > negative Blood Cx  9/12 > negative Blood 9/24 >> Pseudomonas aeruginosa  Antimicrobials:  Zozyn 9/11 only Vancomycin 9/11 >> 9/13 Cefepime 9/12 >> 9/18  Cefepime 9/24 >>   Interim history/subjective:  Frequent coughing spells.  Drooling intermittently.  Still has air leak in chest tube.  Objective   Blood pressure 104/66, pulse 98, temperature 98.4 F (36.9 C), temperature source Oral, resp. rate 18, height 5' 7.5" (1.715 m), weight 57 kg, SpO2 96 %.        Intake/Output Summary (Last 24 hours) at 09/10/2020 1144 Last data filed at 09/10/2020 0601 Gross per 24 hour   Intake 1250.08 ml  Output 400 ml  Net 850.08 ml   Exam:   General - cachectic Eyes - pupils reactive ENT - no sinus tenderness, no stridor Cardiac - regular rate/rhythm, no murmur Chest - b/l crackles, Rt pig tail catheter with 2/7 air leak Abdomen - soft, non tender, + bowel sounds Extremities - decreased muscle bulk Skin - no rashes Neuro - somnolent, mumbles intermittently   Assessment & Plan:   Iatrogenic Rt pneumothorax. - s/p chest tube 9/12 and replaced 9/16 - had complete lung collapse with large pneumothorax on 9/25 when suction was decreased to -10 cm H2O; improved when suction increased to -30 cm H2O - still has air leak and small pneumothorax on 9/26 - explained to his family that he is not making progress toward transitioning chest tube off suction and this will significantly impact their plans for trying to get him home - discussed what could happen if plan was to transition to comfort care and he was discharged home with a chest tube to water seal with home hospice; explained that his symptoms of dyspnea could be medically managed at home  Re-expansion pulmonary edema in Rt lung. - noted on CXR 9/26 - f/u CXR  Pseudomonal bacteremia likely related to HCAP. - ABx restarted 9/24  Pancytopenia. CLL s/p chemotherapy. - per primary team and oncology  Goals of care. - palliative care consulted  Updated pt's wife and daughter at bedside  D/w Dr. Algis Liming.  Signature:  Chesley Mires, MD [facility] Pulmonary/Critical  Care Pager - [phone] 09/10/2020, 11:44 AM

## 2020-09-10 NOTE — Significant Event (Signed)
Rapid Response Event Note   Reason for Call :  Pt returned from modified barium swallow test and complained of feeling short of breath. Bedside nurse asked rapid response to assess chest tube.  Initial Focused Assessment:  Pt drowsy and resting in bed. Normal work of breathing. Oxygen saturation 95%. Chest tube noted to have minimal leak, stable position externally, and hooked up and functioning appropriately. Lung sounds on right side significantly diminished.     Interventions:  MD notified of findings and CXR suggested.  Plan of Care:  Pt remains in 1430. Will await MD recommendations regarding further imaging.   Event Summary:   MD Notified: Algis Liming, MD Call Time: 1632 Arrival Time: 9030 End Time: [place], RN

## 2020-09-10 NOTE — Progress Notes (Signed)
Addendum:  I was paged by patient's RN at 447 to advise that patient had returned from his MBS and complained of difficulty breathing, oxygen saturations were 95% on 3 L/min and rapid response RN had evaluated patient.  I ordered a stat portable chest x-ray and a short while later proceeded upstairs to evaluate patient  Patient was lethargic.  His daughter was by his bedside.  She indicated that the dyspnea was better but he still had some.  Patient was unable to provide any further history.  He was mumbling incomprehensibly.  Chronically ill looking, sitting up in bed, mild increased work of breathing and attempting to clear his secretions.  Afebrile, respiratory rate in the mid 20s, pulse 98/min, oxygen saturation 95% on 3 L/min. Respiratory system: Diminished and harsh breath sounds bilaterally with scattered occasional crackle but no wheezing.  Mild increased work of breathing. CVS: S1 and S2 heard, RRR.  No JVD or murmurs.  No pedal edema.  Personally reviewed chest x-ray.  Subsequently reported by radiology as slightly increased size of right pneumothorax from prior exam, now moderate in size.  Right pigtail catheter remains in place.  Unchanged multifocal airspace opacities in the right hemithorax.  Stable hazy opacity at the left lung base and suprahilar opacities.  Assessment and plan:  I personally discussed with Dr. Halford Chessman, PCCM who reviewed the chest x-ray and indicated that most likely when patient went down for the [facility], he was taken off suction for his chest tube.  He advised placing patient back on suction -30 cm of water as prior, no need to repeat chest x-ray overnight unless patient's condition worsens, follow-up chest x-ray in a.m.  I discussed with patient's RN who did indicate that he went down for the MBS without chest tube suction.  Above recommendations were advised which she verbalized understanding.  I had discussed with patient's daughter at bedside.Vernell Leep, MD,  [place], [facility]. [facility]  To contact the attending provider between 7A-7P or the covering provider during after hours 7P-7A, please log into the web site [email or web address] and access using universal [place] password for that web site. If you do not have the password, please call the hospital operator.

## 2020-09-10 NOTE — Progress Notes (Signed)
PROGRESS NOTE   KAMIR SELOVER  EUM:[id number]    DOB: Apr 06, 1941    DOA: 08/29/2020  PCP: Timoteo Gaul, FNP   I have briefly reviewed patients previous medical records in [facility].  Chief Complaint  Patient presents with  . Hypotension  . Weakness    Brief Narrative:  79 year old male with PMH of chronic systolic CHF, CLL on chemotherapy presented with shock and pneumonia.  He developed large right-sided iatrogenic pneumothorax, now status post chest tube and PCCM continue to follow.  Hospital course complicated by ongoing poor oral intake, failure to thrive, persisting right pneumothorax, new sepsis 9/24 secondary to Pseudomonas bacteremia from likely aspiration pneumonia.  Overall continues to do poorly.   Assessment & Plan:  Active Problems:   CLL (chronic lymphocytic leukemia) (HCC)   Acute respiratory failure (HCC)   Other pancytopenia (HCC)   Acute combined systolic and diastolic heart failure (HCC)   Shock circulatory (HCC)   Septic shock (HCC)   Iatrogenic pneumothorax on R    AKI (acute kidney injury) ([place])   Pressure injury of skin   Pneumothorax on right   Pneumonia due to infectious organism   Severe protein-calorie malnutrition (HCC)   Hypokalemia   Hypomagnesemia   Hypophosphatemia   Acute metabolic encephalopathy  Sepsis/febrile neutropenia, due to Pseudomonas bacteremia possibly from recurrent aspiration pneumonia: Developed new sepsis on 9/24, likely due to recurrent aspiration pneumonia.  Patient/family wish to pursue treat what is treatable.  Sepsis protocol initiated, IVF, IV cefepime and vancomycin.  Lactate normal.  Procalcitonin elevated.  Sepsis physiology improved.  Chest x-ray 9/25 morning shows large right PTX with complete collapse/atelectasis of the right lung without midline shift, right chest tube in place.  Left lung ASD and left lower lung atelectasis/consolidation again noted.  SLP reassessed on 9/25 and continue to recommend  regular diet and thin liquids.  MBS to be done possibly tomorrow.  Discontinued vancomycin, continue cefepime.  Acute respiratory failure with hypoxia: Noted on 9/24 due to pneumonia, AMS.  Treat as above and oxygen support.  Currently on 2 L/min [place] oxygen and saturating at 95%.  Septic shock secondary to pneumonia and hypovolemia from poor oral intake, POA: Shock resolved.  Has been off of vasopressors since 9/15.  Had completed 7 days of IV cefepime.  Please see above, antibiotics reinitiated 9/24.  Iatrogenic right-sided pneumothorax PCCM continues to follow.  S/p chest tube on 9/12, replaced 9/16, repeat chest x-ray 9/25 shows worsened/large right pneumothorax without midline shift.  CCM is increased chest tube suction to -30 cmH2O and which seemed to resolve later on 9/25 on repeat chest x-ray but has small right apical pneumothorax today.  Discussed with Dr. Halford Chessman, continuing chest tube with suction.  Acute kidney injury/metabolic acidosis: S/p bicarbonate drip.  Had resolved.  Creatinine up again to 1.33 today.  May be related to poor oral intake, ongoing diarrhea, recent vancomycin.  Discontinued vancomycin.  Increased IV fluid.  Follow BMP in a.m.  Type II DM with hypoglycemia A1c 5.8 on 9/13. Likely related to poor oral intake.  Discontinued Lantus and changed SSI to sensitive.  No further hypoglycemic episodes.  CBGs reasonable.  Hypophosphatemia/hypomagnesemia/hypokalemia: All replaced.  Acute metabolic encephalopathy Likely multifactorial.  Avoid sedatives.  Delirium precautions.  Clearly mental status fluctuates.  This morning sleepy/lethargic.  CLL: S/p 3 cycles chemotherapy.  Outpatient follow-up with oncology.  Pancytopenia: Likely due to sepsis/acute illness causing bone marrow suppression.  Hemoglobin stable.  Platelets improving/128.  Progressive leukopenia/neutropenia.  Oncology seen 9/24.  Neutropenia may be due to bone marrow suppression from recent septic shock,  current infection and are giving Granix 300 mcg subcutaneously x2 doses.  Transfuse PRBC if hemoglobin 7 g or less and platelets if platelet count 20 K no rectal bleeding.  WBC back down from 0.7-0.5.  Hyponatremia: Mild  GOC: PMT did family meeting on 9/24.  They would like treatable conditions treated, especially concerned about fever today and would like any suspected infection treated prior to DC home with hospice and then does not want to come back to the hospital once he leaves.  Continue Granix, CODE STATUS changed to partial code/no CPR.  Pressure Injury 08/27/20 Sacrum Medial Stage 2 -  Partial thickness loss of dermis presenting as a shallow open injury with a red, pink wound bed without slough. Red area with small area of broken skin in center (Active)  08/27/20 1024  Location: Sacrum  Location Orientation: Medial  Staging: Stage 2 -  Partial thickness loss of dermis presenting as a shallow open injury with a red, pink wound bed without slough.  Wound Description (Comments): Red area with small area of broken skin in center  Present on Admission: Yes     Pressure Injury 08/27/20 Nose Left Deep Tissue Pressure Injury - Purple or maroon localized area of discolored intact skin or blood-filled blister due to damage of underlying soft tissue from pressure and/or shear. (Active)  08/27/20 1024  Location: Nose  Location Orientation: Left  Staging: Deep Tissue Pressure Injury - Purple or maroon localized area of discolored intact skin or blood-filled blister due to damage of underlying soft tissue from pressure and/or shear.  Wound Description (Comments):   Present on Admission: Yes    Body mass index is 19.39 kg/m.  Nutritional Status Nutrition Problem: Severe Malnutrition Etiology: chronic illness, cancer and cancer related treatments Signs/Symptoms: moderate fat depletion, moderate muscle depletion, severe fat depletion, severe muscle depletion Interventions: Magic cup, Tube  feeding, Other (Comment) ([employer] TF)  Patient continues to have poor oral intake.  On Remeron.  Diarrhea: Has rectal tube with liquid green stools.  Low index of suspicion for C. difficile.  May be related to recent infection and antibiotic exposure.  Trial of probiotics.  Trial of her dose of Imodium as well.  Adult failure to thrive: Multifactorial due to advanced age, CLL, multiple severe comorbidities and acute illness listed above.  Reportedly not eating well even prior to recurrence of CLL and even worse after chemotherapy initiation.  Daughter also thinks that patient may be quite depressed.    DVT prophylaxis: Place and maintain sequential compression device Start: 08/27/20 0918 SCDs Start: 08/27/20 0058     Code Status: Partial Code  Family Communication: Discussed in detail with patient's spouse at bedside.  Updated care and answered questions. Disposition:  Status is: Inpatient  Remains inpatient appropriate because:Inpatient level of care appropriate due to severity of illness   Dispo: The patient is from: Home              Anticipated d/c is to: TBD              Anticipated d/c date is: > 3 days              Patient currently is not medically stable to d/c.        Consultants:     Procedures:   9/12 RIJ attempted, R pigtail chest tube by ED >> 9/16 removed since non functional 9/12 L  Fem CVL >> 9/14 , pulled out 9/16 RT pigtail >>   Antimicrobials:    Anti-infectives (From admission, onward)   Start     Dose/Rate Route Frequency Ordered Stop   09/09/20 1800  ceFEPIme (MAXIPIME) 2 g in sodium chloride 0.9 % 100 mL IVPB        2 g 200 mL/hr over 30 Minutes Intravenous Every 12 hours 09/09/20 0907     09/09/20 0400  vancomycin (VANCOREADY) IVPB 500 mg/100 mL  Status:  Discontinued        500 mg 100 mL/hr over 60 Minutes Intravenous Every 12 hours 09/08/20 1456 09/10/20 0825   09/08/20 2200  ceFEPIme (MAXIPIME) 2 g in sodium chloride 0.9 % 100 mL IVPB   Status:  Discontinued        2 g 200 mL/hr over 30 Minutes Intravenous Every 8 hours 09/08/20 1456 09/09/20 0907   09/08/20 1600  vancomycin (VANCOCIN) IVPB 1000 mg/200 mL premix        1,000 mg 200 mL/hr over 60 Minutes Intravenous  Once 09/08/20 1447 09/08/20 1811   09/08/20 1515  ceFEPIme (MAXIPIME) 2 g in sodium chloride 0.9 % 100 mL IVPB        2 g 200 mL/hr over 30 Minutes Intravenous  Once 09/08/20 1447 09/08/20 1556   08/27/20 2200  vancomycin (VANCOREADY) IVPB 500 mg/100 mL  Status:  Discontinued        500 mg 100 mL/hr over 60 Minutes Intravenous Every 24 hours 08/27/20 0136 08/28/20 1133   08/27/20 0200  ceFEPIme (MAXIPIME) 2 g in sodium chloride 0.9 % 100 mL IVPB        2 g 200 mL/hr over 30 Minutes Intravenous Every 12 hours 08/27/20 0136 09/02/20 1521   09/04/2020 1930  vancomycin (VANCOCIN) IVPB 1000 mg/200 mL premix        1,000 mg 200 mL/hr over 60 Minutes Intravenous  Once 08/20/2020 1927 09/09/2020 2305   09/13/2020 1930  piperacillin-tazobactam (ZOSYN) IVPB 3.375 g        3.375 g 100 mL/hr over 30 Minutes Intravenous  Once 08/17/2020 1927 09/03/2020 2154        Subjective:  Patient sleeping, unable to provide history.  As per spouse at bedside, did not sleep well last night.  Had an occasional episode of chest pain which resolved.  Had been coughing up thick white phlegm.  Ongoing very poor oral intake.   Objective:   Vitals:   09/09/20 2029 09/09/20 2145 09/10/20 0339 09/10/20 0504  BP: 104/68   104/66  Pulse: (!) 101   98  Resp: 20 (!) 21  18  Temp: 98 F (36.7 C) 97.7 F (36.5 C)  98.4 F (36.9 C)  TempSrc: Oral Oral  Oral  SpO2: 94%   96%  Weight:   57 kg   Height:        General exam: Elderly male, moderately built and frail, chronically ill looking, with fluctuating status, looks poorly today compared to yesterday.  Propped up in bed in sleeping. Respiratory system: Improved breath sounds right lung fields.  Harsh breath sounds bilaterally.  No wheezing,  rhonchi.  Occasional basal crackles.  No increased work of breathing. Cardiovascular system: S1 & S2 heard, RRR. No JVD, murmurs, rubs, gallops or clicks. No pedal edema.  Telemetry personally reviewed: Sinus rhythm. Gastrointestinal system: Abdomen is nondistended, soft and nontender. No organomegaly or masses felt. Normal bowel sounds heard.  Has rectal tube with liquid green stools. Central nervous system:  Sleeping.  No focal neurological deficits. Extremities: Symmetric 5 x 5 power. Skin: No rashes, lesions or ulcers Psychiatry: Judgement and insight impaired. Mood & affect-cannot assess    Data Reviewed:   I have personally reviewed following labs and imaging studies   CBC: Recent Labs  Lab 09/08/20 0543 09/09/20 0421 09/10/20 0455  WBC 0.4* 0.7* 0.5*  NEUTROABS 0.1* 0.1* 0.1*  HGB 8.4* 9.2* 8.9*  HCT 26.3* 29.1* 27.9*  MCV 88.0 89.0 87.2  PLT 82* 128* 106*    Basic Metabolic Panel: Recent Labs  Lab 09/07/20 0747 09/07/20 0747 09/08/20 0543 09/09/20 0421 09/10/20 0455  NA 136   < > 135 134* 138  K 3.0*   < > 3.2* 4.4 4.4  CL 92*   < > 93* 94* 100  CO2 33*   < > 30 26 24   GLUCOSE 125*   < > 99 160* 137*  BUN 20   < > 20 29* 41*  CREATININE 0.64   < > 0.58* 0.89 1.33*  CALCIUM 8.2*   < > 7.7* 8.1* 8.0*  MG 1.7   < > 1.6* 2.3 2.3  PHOS 1.8*  --  3.0 3.6  --    < > = values in this interval not displayed.    Liver Function Tests: Recent Labs  Lab 09/04/20 0425 09/04/20 0425 09/07/20 0747 09/08/20 0543 09/09/20 0421  AST 32  --   --   --   --   ALT 9  --   --   --   --   ALKPHOS 50  --   --   --   --   BILITOT 0.9  --   --   --   --   PROT 4.0*  --   --   --   --   ALBUMIN 2.3*   < > 2.2* 2.1* 2.3*   < > = values in this interval not displayed.    CBG: Recent Labs  Lab 09/10/20 0510 09/10/20 0741 09/10/20 1116  GLUCAP 145* 150* 138*    Microbiology Studies:   Recent Results (from the past 240 hour(s))  Culture, blood (x 2)     Status: None  (Preliminary result)   Collection Time: 09/08/20  3:02 PM   Specimen: BLOOD  Result Value Ref Range Status   Specimen Description   Final    BLOOD RIGHT ANTECUBITAL Performed at [facility] [address], [place], [place] [address]    Special Requests   Final    BOTTLES DRAWN AEROBIC AND ANAEROBIC Blood Culture adequate volume Performed at [facility] [address], [place], [place] [address]    Culture   Final    NO GROWTH 2 DAYS Performed at [facility] [address] [place], [place] [address]    Report Status PENDING  Incomplete  Culture, blood (x 2)     Status: Abnormal (Preliminary result)   Collection Time: 09/08/20  3:03 PM   Specimen: BLOOD  Result Value Ref Range Status   Specimen Description   Final    BLOOD LEFT ANTECUBITAL Performed at [facility] [address] [place], [place] [address]    Special Requests   Final    BOTTLES DRAWN AEROBIC AND ANAEROBIC Blood Culture adequate volume Performed at [facility] [address], [place], [place] [address]    Culture  Setup Time   Final    GRAM NEGATIVE RODS AEROBIC  BOTTLE ONLY Organism ID to follow CRITICAL RESULT CALLED TO, READ BACK BY AND VERIFIED WITH: A. PHAM PHARMD, AT 1247 09/09/20 BY Rush Landmark Performed at [facility], [place] [address] [place], [place] [address]    Culture PSEUDOMONAS AERUGINOSA (A)  Final   Report Status PENDING  Incomplete  Blood Culture ID Panel (Reflexed)     Status: Abnormal   Collection Time: 09/08/20  3:03 PM  Result Value Ref Range Status   Enterococcus faecalis NOT DETECTED NOT DETECTED Final   Enterococcus Faecium NOT DETECTED NOT DETECTED Final   Listeria monocytogenes NOT DETECTED NOT DETECTED Final   Staphylococcus species NOT DETECTED NOT DETECTED Final   Staphylococcus aureus (BCID) NOT DETECTED NOT DETECTED Final   Staphylococcus epidermidis NOT DETECTED NOT DETECTED Final    Staphylococcus lugdunensis NOT DETECTED NOT DETECTED Final   Streptococcus species NOT DETECTED NOT DETECTED Final   Streptococcus agalactiae NOT DETECTED NOT DETECTED Final   Streptococcus pneumoniae NOT DETECTED NOT DETECTED Final   Streptococcus pyogenes NOT DETECTED NOT DETECTED Final   A.calcoaceticus-baumannii NOT DETECTED NOT DETECTED Final   Bacteroides fragilis NOT DETECTED NOT DETECTED Final   Enterobacterales NOT DETECTED NOT DETECTED Final   Enterobacter cloacae complex NOT DETECTED NOT DETECTED Final   Escherichia coli NOT DETECTED NOT DETECTED Final   Klebsiella aerogenes NOT DETECTED NOT DETECTED Final   Klebsiella oxytoca NOT DETECTED NOT DETECTED Final   Klebsiella pneumoniae NOT DETECTED NOT DETECTED Final   Proteus species NOT DETECTED NOT DETECTED Final   Salmonella species NOT DETECTED NOT DETECTED Final   Serratia marcescens NOT DETECTED NOT DETECTED Final   Haemophilus influenzae NOT DETECTED NOT DETECTED Final   Neisseria meningitidis NOT DETECTED NOT DETECTED Final   Pseudomonas aeruginosa DETECTED (A) NOT DETECTED Final    Comment: CRITICAL RESULT CALLED TO, READ BACK BY AND VERIFIED WITH: A. PHAM PHARMD, AT 1247 09/09/20 BY D. VANHOOK    Stenotrophomonas maltophilia NOT DETECTED NOT DETECTED Final   Candida albicans NOT DETECTED NOT DETECTED Final   Candida auris NOT DETECTED NOT DETECTED Final   Candida glabrata NOT DETECTED NOT DETECTED Final   Candida krusei NOT DETECTED NOT DETECTED Final   Candida parapsilosis NOT DETECTED NOT DETECTED Final   Candida tropicalis NOT DETECTED NOT DETECTED Final   Cryptococcus neoformans/gattii NOT DETECTED NOT DETECTED Final   CTX-M ESBL NOT DETECTED NOT DETECTED Final   Carbapenem resistance IMP NOT DETECTED NOT DETECTED Final   Carbapenem resistance KPC NOT DETECTED NOT DETECTED Final   Carbapenem resistance NDM NOT DETECTED NOT DETECTED Final   Carbapenem resistance VIM NOT DETECTED NOT DETECTED Final    Comment:  Performed at [facility] [address] [place], [place] [address]  Urine culture     Status: Abnormal   Collection Time: 09/08/20  7:39 PM   Specimen: Urine, Catheterized  Result Value Ref Range Status   Specimen Description   Final    URINE, CATHETERIZED Performed at [facility] [address], [place], [place] [address]    Special Requests   Final    NONE Performed at [facility], [place] [address], [place], [place] [address]    Culture MULTIPLE SPECIES PRESENT, SUGGEST RECOLLECTION (A)  Final   Report Status 09/10/2020 FINAL  Final     Radiology Studies:  DG Chest Port 1 View  Result Date: 09/10/2020 CLINICAL DATA:  Pneumothorax on the right EXAM: PORTABLE CHEST 1 VIEW COMPARISON:  Yesterday FINDINGS: Small  right apical pneumothorax, 3 rib spaces in height. Chest tube is in stable position. Extensive right-sided upper lobe airspace disease since yesterday. Hazy density at the left base where there was likely pleural fluid. Streaky left perihilar density. IMPRESSION: 1. Small right apical pneumothorax with mild increase from the second film yesterday. 2. New extensive right upper lobe airspace disease, suspect re-expansion edema given the degree of right pulmonary collapse on the first film yesterday. Electronically Signed   By: Monte Fantasia M.D.   On: 09/10/2020 07:41   DG Chest Port 1 View  Result Date: 09/09/2020 CLINICAL DATA:  Follow-up chest tube and pneumothorax EXAM: PORTABLE CHEST 1 VIEW COMPARISON:  09/09/20 FINDINGS: Cardiac shadow is stable in appearance. Aortic calcifications are seen. Pigtail catheter is again seen on the right with near complete resolution of previously seen right-sided pneumothorax. Small right-sided pleural effusion is noted. Right basilar infiltrate remains. Persistent infiltrative change in left base is noted. IMPRESSION: Near complete resolution of right-sided pneumothorax. Persistent bilateral opacities  with effusions. Electronically Signed   By: Inez Catalina M.D.   On: 09/09/2020 13:57   DG Chest Port 1 View  Result Date: 09/09/2020 CLINICAL DATA:  Follow-up pneumothorax. EXAM: PORTABLE CHEST 1 VIEW COMPARISON:  09/08/2020 FINDINGS: The RIGHT pneumothorax is now very large with complete collapse/atelectasis of the RIGHT lung. There is no definite midline shift. A RIGHT thoracostomy tube is again noted. LEFT lung airspace disease and LEFT LOWER lung atelectasis/consolidation again noted. No other interval change identified. IMPRESSION: Large RIGHT pneumothorax with complete collapse/atelectasis of the RIGHT lung. RIGHT thoracostomy tube remains in place. No definite midline shift. LEFT lung airspace disease and LEFT LOWER lung atelectasis/consolidation again noted. Critical Value/emergent results were called by telephone at the time of interpretation on 09/09/2020 at 7:36 am to provider Gregary Signs, nurse for this patient, who verbally acknowledged these results. Electronically Signed   By: Margarette Canada M.D.   On: 09/09/2020 07:38     Scheduled Meds:   . insulin aspart  0-9 Units Subcutaneous TID WC  . mouth rinse  15 mL Mouth Rinse BID  . mirtazapine  7.5 mg Oral QHS  . multivitamin with minerals  1 tablet Oral Daily  . pantoprazole  40 mg Oral Daily  . saccharomyces boulardii  250 mg Oral BID  . sodium chloride flush  10 mL Intracatheter Q8H  . sodium chloride flush  10-40 mL Intracatheter Q12H    Continuous Infusions:   . sodium chloride 10 mL/hr at 09/01/20 1800  . 0.9 % NaCl with KCl 20 mEq / L 100 mL/hr at 09/10/20 0911  . ceFEPime (MAXIPIME) IV 2 g (09/10/20 0525)     LOS: 14 days     Vernell Leep, MD, [place], [facility]. [facility]    To contact the attending provider between 7A-7P or the covering provider during after hours 7P-7A, please log into the web site [email or web address] and access using universal [place] password for that web site. If you do not have the password, please  call the hospital operator.  09/10/2020, 12:56 PM

## 2020-09-11 ENCOUNTER — Inpatient Hospital Stay (HOSPITAL_COMMUNITY): Payer: Medicare HMO

## 2020-09-11 DIAGNOSIS — C9110 Chronic lymphocytic leukemia of B-cell type not having achieved remission: Secondary | ICD-10-CM

## 2020-09-11 LAB — CBC WITH DIFFERENTIAL/PLATELET
Abs Immature Granulocytes: 0.01 10*3/uL (ref 0.00–0.07)
Basophils Absolute: 0 10*3/uL (ref 0.0–0.1)
Basophils Relative: 1 %
Eosinophils Absolute: 0 10*3/uL (ref 0.0–0.5)
Eosinophils Relative: 0 %
HCT: 27.5 % — ABNORMAL LOW (ref 39.0–52.0)
Hemoglobin: 8.6 g/dL — ABNORMAL LOW (ref 13.0–17.0)
Immature Granulocytes: 1 %
Lymphocytes Relative: 55 %
Lymphs Abs: 0.4 10*3/uL — ABNORMAL LOW (ref 0.7–4.0)
MCH: 27.7 pg (ref 26.0–34.0)
MCHC: 31.3 g/dL (ref 30.0–36.0)
MCV: 88.7 fL (ref 80.0–100.0)
Monocytes Absolute: 0.1 10*3/uL (ref 0.1–1.0)
Monocytes Relative: 16 %
Neutro Abs: 0.2 10*3/uL — ABNORMAL LOW (ref 1.7–7.7)
Neutrophils Relative %: 27 %
Platelets: 89 10*3/uL — ABNORMAL LOW (ref 150–400)
RBC: 3.1 MIL/uL — ABNORMAL LOW (ref 4.22–5.81)
RDW: 22.1 % — ABNORMAL HIGH (ref 11.5–15.5)
WBC: 0.7 10*3/uL — CL (ref 4.0–10.5)
nRBC: 0 % (ref 0.0–0.2)

## 2020-09-11 LAB — CULTURE, BLOOD (ROUTINE X 2): Special Requests: ADEQUATE

## 2020-09-11 LAB — GLUCOSE, CAPILLARY
Glucose-Capillary: 157 mg/dL — ABNORMAL HIGH (ref 70–99)
Glucose-Capillary: 133 mg/dL — ABNORMAL HIGH (ref 70–99)
Glucose-Capillary: 109 mg/dL — ABNORMAL HIGH (ref 70–99)
Glucose-Capillary: 132 mg/dL — ABNORMAL HIGH (ref 70–99)

## 2020-09-11 LAB — BASIC METABOLIC PANEL
Anion gap: 13 (ref 5–15)
BUN: 49 mg/dL — ABNORMAL HIGH (ref 8–23)
CO2: 22 mmol/L (ref 22–32)
Calcium: 8 mg/dL — ABNORMAL LOW (ref 8.9–10.3)
Chloride: 104 mmol/L (ref 98–111)
Creatinine, Ser: 1.52 mg/dL — ABNORMAL HIGH (ref 0.61–1.24)
GFR calc Af Amer: 51 mL/min — ABNORMAL LOW (ref >60)
GFR calc non Af Amer: 44 mL/min — ABNORMAL LOW (ref >60)
Glucose, Bld: 165 mg/dL — ABNORMAL HIGH (ref 70–99)
Potassium: 4.1 mmol/L (ref 3.5–5.1)
Sodium: 139 mmol/L (ref 135–145)

## 2020-09-11 LAB — MAGNESIUM: Magnesium: 2.2 mg/dL (ref 1.7–2.4)

## 2020-09-11 MED ORDER — SODIUM CHLORIDE 0.9% FLUSH
10.0000 mL | Freq: Three times a day (TID) | INTRAVENOUS | Status: DC
  Administered 2020-09-11 – 2020-09-14 (×6): 10 mL

## 2020-09-11 MED ORDER — LIDOCAINE HCL 2 % IJ SOLN
INTRAMUSCULAR | Status: AC
  Filled 2020-09-11: qty 20

## 2020-09-11 MED ORDER — LIDOCAINE HCL (PF) 2 % IJ SOLN
2.0000 mL | Freq: Once | INTRAMUSCULAR | Status: AC
  Filled 2020-09-11 (×2): qty 2

## 2020-09-11 MED ORDER — SODIUM CHLORIDE 0.9 % IV SOLN: INTRAVENOUS | Status: DC

## 2020-09-11 MED ORDER — TBO-FILGRASTIM 300 MCG/0.5ML ~~LOC~~ SOSY
300.0000 ug | PREFILLED_SYRINGE | Freq: Every day | SUBCUTANEOUS | Status: DC
  Administered 2020-09-11 – 2020-09-12 (×2): 300 ug via SUBCUTANEOUS
  Filled 2020-09-11 (×2): qty 0.5

## 2020-09-11 NOTE — Progress Notes (Signed)
Pt prepped for chest tube exchanged, spouse and daughter aware of procedure. Spouse consented to procedure due to pt lethargy status. Pt arouses to name and drift to sleep. Pt current chest tube noted with slight hissing sound, pt with decreased breath sounds. 11:53 am time out complete for the procedure. AC, CN, CCRN - Rapid Response RN at bedside. Chest tube exchanged complete with 14 F under sterile technique, procedure complete at 12:20pm. VS in computer and stable. Chest x-ray ordered. Will continue to monitor. Family at bedside. SRP, RN

## 2020-09-11 NOTE — Progress Notes (Signed)
    NAME:  Tony Marshall, MRN:  [id number], DOB:  1941-11-04, LOS: 22 ADMISSION DATE:  09/09/2020, CONSULTATION DATE:  09/11/20 REFERRING MD:  EDP, CHIEF COMPLAINT:  lethargy  Brief History   78 yowm, never smoker, with history of HFrEF and CLL s/p three cycles chemo and rx avelox since 8/27 for PNA who presented with lethargy and poor PO intake.  Found to have worsening bibasilar airspace disease and pleural effusions with hypotension despite 3.5L IVF. PCCM consulted for admission. Labs were significant for WBC 3.3, Hgb 6.1 down from baseline of 9 several weeks ago, Lactic acid 3.2, Covid-19 negative. Course complicated by right pneumothorax after right IJ attempt.  Past Medical History  Atrial fibrillation not on anticoagulation HFrEF CLL s/p 3 cycles chemo DM HTN GERD OSA  Significant Hospital Events   9/11 Admit to PCCM 9/15 weaned off vasopressors ,increasing delirium, pulled out his central line  9/17 transfer to [facility], PCCM following for chest tube 9/20 Residual chest tube air leak 1/7 9/25 larger Rt pneumothorax, change suction to -30 cm water 9/27: still with persistent pneumothorax Consults:  Palliative care  Procedures:  9/12 RIJ attempted, R pigtail chest tube by ED >> 9/16 removed since non functional 9/12 L Fem CVL >> 9/14 , pulled out 9/16 RT pigtail >>  Significant Diagnostic Tests:     Micro Data:  Sars-Cov-2 PCR 9/11 > negative Blood Cx  9/12 > negative Blood 9/24 >> Pseudomonas aeruginosa  Antimicrobials:  Zozyn 9/11 only Vancomycin 9/11 >> 9/13 Cefepime 9/12 >> 9/18  Cefepime 9/24 >>   Interim history/subjective:  Less coughing as per family by bedside Chest tube not titling Objective   Blood pressure 112/72, pulse 84, temperature (!) 97.3 F (36.3 C), temperature source Oral, resp. rate 20, height 5' 7.5" (1.715 m), weight 58.1 kg, SpO2 98 %.        Intake/Output Summary (Last 24 hours) at 09/11/2020 0756 Last data filed at 09/11/2020  0600 Gross per 24 hour  Intake 530 ml  Output 850 ml  Net -320 ml   Exam:   General - cachectic, responsive but very frail  eyes -pupils reactive ENT -moist oral mucosa Cardiac - regular rate/rhythm, no murmur Chest -decreased air entry bilaterally Abdomen -bowel sounds appreciated Extremities - decreased muscle bulk Skin - no rashes Neuro -somnolent, arouses easily  I did flush the tube at bedside with 10 cc saline Tube flushes relatively easily however unable to aspirate air easily Tube likely needs replaced-discussed with spouse at bedside  Assessment & Plan:  Iatrogenic right pneumothorax -S/p chest tube 9/12, replaced 9/16 -Complete lung collapse with large pneumothorax on 925 1 suction was decreased to -10, this improved with -30 suction  Iatrogenic Rt pneumothorax. - s/p chest tube 9/12 and replaced 9/16 - had complete lung collapse with large pneumothorax on 9/25 when suction was decreased to -10 cm H2O; improved when suction increased to -30 cm H2O -Persistent pneumothorax on chest x-ray -Chest tube will likely need replaced -Discussed with family member at bedside -Discharging patient home with a chest tube to waterseal is a possibility according to hospice -Palliative care involved with care  Pseudomonal bacteremia related to HAP -On antibiotics 9/24  Pancytopenia Related to CLL s/p chemotherapy -Per primary care service and oncology  Palliative care consulted for goals of care discussions  Spouse updated at bedside  Sherrilyn Rist, MD [facility] Pager: [phone]

## 2020-09-11 NOTE — Progress Notes (Signed)
PHARMACY NOTE -  Cefepime  Pharmacy has been assisting with dosing of cefepime for pseudomonas bacteremia.  Dosage remains stable at 2g IV q12 hr and Pharmacy may renally adjust as needed per P&T approved protocol  Currently ordered Granix for neutropenia; If patient remains immunosuppressed may need longer duration of cefepime (10-14 days)  Pharmacy will sign off, following peripherally for culture results, dose adjustments, and LOT. Please reconsult if a change in clinical status warrants re-evaluation of dosage.  Reuel Boom, PharmD, BCPS [phone] 09/11/2020, 10:49 AM

## 2020-09-11 NOTE — Progress Notes (Signed)
Patient with clinically significant pneumothorax with a chest tube in place   Chest tube was inserted without complications Tolerated well   Previous chest tube in place was removed uneventfully.   Remain hemodynamically stable throughout procedure Chest x-ray ordered  Chest tube placed to suction -20

## 2020-09-11 NOTE — Progress Notes (Signed)
PROGRESS NOTE   Tony Marshall  WRU:[id number]    DOB: 05-Aug-1941    DOA: 09/13/2020  PCP: Timoteo Gaul, FNP   I have briefly reviewed patients previous medical records in [facility].  Chief Complaint  Patient presents with  . Hypotension  . Weakness    Brief Narrative:  79 year old male with PMH of chronic systolic CHF, CLL on chemotherapy presented with shock and pneumonia.  He developed large right-sided iatrogenic pneumothorax, now status post chest tube and PCCM continue to follow.  Hospital course complicated by ongoing poor oral intake, failure to thrive, persisting right pneumothorax, new sepsis 9/24 secondary to Pseudomonas bacteremia from likely aspiration pneumonia.  Overall continues to do poorly.   Assessment & Plan:  Active Problems:   CLL (chronic lymphocytic leukemia) (HCC)   Acute respiratory failure (HCC)   Other pancytopenia (HCC)   Acute combined systolic and diastolic heart failure (HCC)   Shock circulatory (HCC)   Septic shock (HCC)   Iatrogenic pneumothorax on R    AKI (acute kidney injury) ([place])   Pressure injury of skin   Pneumothorax on right   Pneumonia due to infectious organism   Severe protein-calorie malnutrition (HCC)   Hypokalemia   Hypomagnesemia   Hypophosphatemia   Acute metabolic encephalopathy  Sepsis/febrile neutropenia, due to Pseudomonas bacteremia possibly from recurrent aspiration pneumonia: Developed new sepsis on 9/24, likely due to recurrent aspiration pneumonia.  Patient/family wish to pursue treat what is treatable.  Sepsis protocol initiated, IVF, IV cefepime and vancomycin.  Lactate normal.  Procalcitonin elevated.  Sepsis physiology improved.  Multiple follow-up chest x-ray show persisting infiltrate/pneumonia findings.  SLP performed MBS 9/26 and recommend regular diet and thin liquids.  Discontinued vancomycin, continue cefepime.  Cefepime 9/24 >>  Acute respiratory failure with hypoxia: Multifactorial due to  right-sided iatrogenic pneumothorax, pneumonia, severe deconditioning and ongoing mental status changes.  Currently saturating in the high 90s on 3 L/min [place] oxygen.  Septic shock secondary to pneumonia and hypovolemia from poor oral intake, POA: Shock resolved.  Had completed antibiotics for this indication but antibiotics resumed as noted above  Iatrogenic right-sided pneumothorax PCCM continues to follow.  S/p chest tube on 9/12, replaced 9/16, repeat chest x-ray 9/25 shows worsened/large right pneumothorax without midline shift.  CCM increased chest tube suction to -30 cmH2O and which seemed to resolve later on 9/25 on repeat chest x-ray but has small right apical pneumothorax today.  Chest tube changed again on 9/27 due to persisting and increasing right pneumothorax despite prior chest tube.  Acute kidney injury/metabolic acidosis: S/p bicarbonate drip.  Had resolved but recurred, creatinine up to 1.33 > 1.52 in the last 2 days.  May be related to poor oral intake, ongoing diarrhea, recent vancomycin.  Discontinued vancomycin.  IV fluids were discontinued last night due to dyspnea.  However clinically not volume overloaded.  Resumed IV fluids today.  Avoid nephrotoxic's.  Follow BMP in a.m.  Type II DM with hypoglycemia A1c 5.8 on 9/13. Likely related to poor oral intake.  Discontinued Lantus and changed SSI to sensitive.  No further hypoglycemic episodes.  CBGs reasonable.  Hypophosphatemia/hypomagnesemia/hypokalemia: All replaced.  Acute metabolic encephalopathy Likely multifactorial.  Avoid sedatives.  Delirium precautions.  Ongoing fluctuating mental status changes.  CLL: S/p 3 cycles chemotherapy.  Outpatient follow-up with oncology.  Pancytopenia: Likely due to sepsis/acute illness causing bone marrow suppression.  Hemoglobin stable.  Platelets improving/128.  Progressive leukopenia/neutropenia.  Oncology seen 9/24.  Neutropenia may be due to bone  marrow suppression from recent  septic shock, current infection and are giving Granix 300 mcg subcutaneously x2 doses.  Transfuse PRBC if hemoglobin 7 g or less and platelets if platelet count 20 K no rectal bleeding.  WBC remains low, 0.7 with ANC of 0.2.  No significant improvement despite Granix.  Hyponatremia: Resolved  GOC: PMT did family meeting on 9/24.  They would like treatable conditions treated, especially concerned about fever and would like any suspected infection treated prior to DC home with hospice and then does not want to come back to the hospital once he leaves.  Continue Granix, CODE STATUS changed to partial code/no CPR.  Pressure Injury 08/27/20 Sacrum Medial Stage 2 -  Partial thickness loss of dermis presenting as a shallow open injury with a red, pink wound bed without slough. Red area with small area of broken skin in center (Active)  08/27/20 1024  Location: Sacrum  Location Orientation: Medial  Staging: Stage 2 -  Partial thickness loss of dermis presenting as a shallow open injury with a red, pink wound bed without slough.  Wound Description (Comments): Red area with small area of broken skin in center  Present on Admission: Yes     Pressure Injury 08/27/20 Nose Left Deep Tissue Pressure Injury - Purple or maroon localized area of discolored intact skin or blood-filled blister due to damage of underlying soft tissue from pressure and/or shear. (Active)  08/27/20 1024  Location: Nose  Location Orientation: Left  Staging: Deep Tissue Pressure Injury - Purple or maroon localized area of discolored intact skin or blood-filled blister due to damage of underlying soft tissue from pressure and/or shear.  Wound Description (Comments):   Present on Admission: Yes    Body mass index is 19.77 kg/m.  Nutritional Status Nutrition Problem: Severe Malnutrition Etiology: chronic illness, cancer and cancer related treatments Signs/Symptoms: moderate fat depletion, moderate muscle depletion, severe fat  depletion, severe muscle depletion Interventions: Magic cup, Tube feeding, Other (Comment) ([employer] TF)  Patient continues to have poor oral intake.  On Remeron.  Diarrhea: Has rectal tube with liquid green stools.  Low index of suspicion for C. difficile.  May be related to recent infection and antibiotic exposure.  Tried a dose of Imodium yesterday.  As per pharmacy recommendations, discontinue probiotics.  May be decreasing.  Adult failure to thrive: Multifactorial due to advanced age, CLL, multiple severe comorbidities and acute illness listed above.  Reportedly not eating well even prior to recurrence of CLL and even worse after chemotherapy initiation.  Daughter also thinks that patient may be quite depressed.    DVT prophylaxis: Place and maintain sequential compression device Start: 08/27/20 0918 SCDs Start: 08/27/20 0058     Code Status: Partial Code  Family Communication: Discussed in detail with patient's spouse at bedside on 9/27.  Updated care and answered questions. Disposition:  Status is: Inpatient  Remains inpatient appropriate because:Inpatient level of care appropriate due to severity of illness   Dispo: The patient is from: Home              Anticipated d/c is to: TBD              Anticipated d/c date is: > 3 days              Patient currently is not medically stable to d/c.        Consultants:     Procedures:   9/12 RIJ attempted, R pigtail chest tube by ED >> 9/16 removed  since non functional 9/12 L Fem CVL >> 9/14 , pulled out 9/16 RT pigtail >> 9/27 right-sided chest tube changed by CCM Has rectal tube.  Antimicrobials:    Anti-infectives (From admission, onward)   Start     Dose/Rate Route Frequency Ordered Stop   09/09/20 1800  ceFEPIme (MAXIPIME) 2 g in sodium chloride 0.9 % 100 mL IVPB        2 g 200 mL/hr over 30 Minutes Intravenous Every 12 hours 09/09/20 0907     09/09/20 0400  vancomycin (VANCOREADY) IVPB 500 mg/100 mL  Status:   Discontinued        500 mg 100 mL/hr over 60 Minutes Intravenous Every 12 hours 09/08/20 1456 09/10/20 0825   09/08/20 2200  ceFEPIme (MAXIPIME) 2 g in sodium chloride 0.9 % 100 mL IVPB  Status:  Discontinued        2 g 200 mL/hr over 30 Minutes Intravenous Every 8 hours 09/08/20 1456 09/09/20 0907   09/08/20 1600  vancomycin (VANCOCIN) IVPB 1000 mg/200 mL premix        1,000 mg 200 mL/hr over 60 Minutes Intravenous  Once 09/08/20 1447 09/08/20 1811   09/08/20 1515  ceFEPIme (MAXIPIME) 2 g in sodium chloride 0.9 % 100 mL IVPB        2 g 200 mL/hr over 30 Minutes Intravenous  Once 09/08/20 1447 09/08/20 1556   08/27/20 2200  vancomycin (VANCOREADY) IVPB 500 mg/100 mL  Status:  Discontinued        500 mg 100 mL/hr over 60 Minutes Intravenous Every 24 hours 08/27/20 0136 08/28/20 1133   08/27/20 0200  ceFEPIme (MAXIPIME) 2 g in sodium chloride 0.9 % 100 mL IVPB        2 g 200 mL/hr over 30 Minutes Intravenous Every 12 hours 08/27/20 0136 09/02/20 1521   09/10/2020 1930  vancomycin (VANCOCIN) IVPB 1000 mg/200 mL premix        1,000 mg 200 mL/hr over 60 Minutes Intravenous  Once 08/30/2020 1927 08/30/2020 2305   09/11/2020 1930  piperacillin-tazobactam (ZOSYN) IVPB 3.375 g        3.375 g 100 mL/hr over 30 Minutes Intravenous  Once 09/02/2020 1927 09/13/2020 2154        Subjective:  Patient was sleeping when I walked into the room.  Spouse at bedside.  Reports that patient slept well last night compared to the night prior.  Occasional dyspnea but no pain reported.  Still with poor oral intake but wants to try feeding him today and see.   Objective:   Vitals:   09/11/20 1200 09/11/20 1205 09/11/20 1215 09/11/20 1220  BP: 116/85 122/75 (!) 124/95 112/67  Pulse: (!) 104 96 98 98  Resp: (!) 25 (!) 22 (!) 22 (!) 26  Temp:      TempSrc:      SpO2: 97% 96% 93% 94%  Weight:      Height:        General exam: Elderly male, moderately built and frail, chronically ill looking, lying propped up in  bed.  Frail and weak looking. Respiratory system: I saw patient prior to chest tube change.  Diminished breath sounds right lung fields but present.  Slightly harsh breath sounds left lung fields.  Few basal crackles.  No rhonchi or wheezing.  Mild tachypnea. Cardiovascular system: S1 & S2 heard, RRR. No JVD, murmurs, rubs, gallops or clicks. No pedal edema.  Telemetry personally reviewed: Sinus rhythm. Gastrointestinal system: Abdomen is nondistended, soft and nontender.  No organomegaly or masses felt. Normal bowel sounds heard.  Has rectal tube with liquid green stools. Central nervous system: Sleeping but woke up, alert and oriented to self and partly to place.  No focal neurological deficits. Extremities: Symmetric 5 x 5 power. Skin: No rashes, lesions or ulcers Psychiatry: Judgement and insight impaired. Mood & affect-difficult to assess and mostly flat.    Data Reviewed:   I have personally reviewed following labs and imaging studies   CBC: Recent Labs  Lab 09/09/20 0421 09/10/20 0455 09/11/20 0436  WBC 0.7* 0.5* 0.7*  NEUTROABS 0.1* 0.1* 0.2*  HGB 9.2* 8.9* 8.6*  HCT 29.1* 27.9* 27.5*  MCV 89.0 87.2 88.7  PLT 128* 106* 89*    Basic Metabolic Panel: Recent Labs  Lab 09/07/20 0747 09/07/20 0747 09/08/20 0543 09/08/20 0543 09/09/20 0421 09/10/20 0455 09/11/20 0436  NA 136   < > 135   < > 134* 138 139  K 3.0*   < > 3.2*   < > 4.4 4.4 4.1  CL 92*   < > 93*   < > 94* 100 104  CO2 33*   < > 30   < > 26 24 22   GLUCOSE 125*   < > 99   < > 160* 137* 165*  BUN 20   < > 20   < > 29* 41* 49*  CREATININE 0.64   < > 0.58*   < > 0.89 1.33* 1.52*  CALCIUM 8.2*   < > 7.7*   < > 8.1* 8.0* 8.0*  MG 1.7   < > 1.6*   < > 2.3 2.3 2.2  PHOS 1.8*  --  3.0  --  3.6  --   --    < > = values in this interval not displayed.    Liver Function Tests: Recent Labs  Lab 09/07/20 0747 09/08/20 0543 09/09/20 0421  ALBUMIN 2.2* 2.1* 2.3*    CBG: Recent Labs  Lab 09/10/20 1607  09/10/20 2029 09/11/20 0737  GLUCAP 151* 137* 157*    Microbiology Studies:   Recent Results (from the past 240 hour(s))  Culture, blood (x 2)     Status: None (Preliminary result)   Collection Time: 09/08/20  3:02 PM   Specimen: BLOOD  Result Value Ref Range Status   Specimen Description   Final    BLOOD RIGHT ANTECUBITAL Performed at [facility], [place] [address] [place], [place] [address]    Special Requests   Final    BOTTLES DRAWN AEROBIC AND ANAEROBIC Blood Culture adequate volume Performed at [facility] [address] [place], [place] [address]    Culture   Final    NO GROWTH 3 DAYS Performed at [facility], [place] [address] [place], [place] [address]    Report Status PENDING  Incomplete  Culture, blood (x 2)     Status: Abnormal   Collection Time: 09/08/20  3:03 PM   Specimen: BLOOD  Result Value Ref Range Status   Specimen Description   Final    BLOOD LEFT ANTECUBITAL Performed at [facility] [address] [place], [place] [address]    Special Requests   Final    BOTTLES DRAWN AEROBIC AND ANAEROBIC Blood Culture adequate volume Performed at [facility] [address], [place], [place] [address]    Culture  Setup Time   Final    GRAM NEGATIVE RODS AEROBIC BOTTLE ONLY CRITICAL RESULT CALLED TO,  READ BACK BY AND VERIFIED WITH: A. PHAM PHARMD, AT 1247 09/09/20 BY Rush Landmark Performed at [facility], [place] [address] [place], [place] [address]    Culture PSEUDOMONAS AERUGINOSA (A)  Final   Report Status 09/11/2020 FINAL  Final   Organism ID, Bacteria PSEUDOMONAS AERUGINOSA  Final      Susceptibility   Pseudomonas aeruginosa - MIC*    CEFTAZIDIME 4 SENSITIVE Sensitive     CIPROFLOXACIN <=0.25 SENSITIVE Sensitive     GENTAMICIN 8 INTERMEDIATE Intermediate     IMIPENEM 2 SENSITIVE Sensitive     PIP/TAZO 16 SENSITIVE Sensitive     * PSEUDOMONAS AERUGINOSA  Blood  Culture ID Panel (Reflexed)     Status: Abnormal   Collection Time: 09/08/20  3:03 PM  Result Value Ref Range Status   Enterococcus faecalis NOT DETECTED NOT DETECTED Final   Enterococcus Faecium NOT DETECTED NOT DETECTED Final   Listeria monocytogenes NOT DETECTED NOT DETECTED Final   Staphylococcus species NOT DETECTED NOT DETECTED Final   Staphylococcus aureus (BCID) NOT DETECTED NOT DETECTED Final   Staphylococcus epidermidis NOT DETECTED NOT DETECTED Final   Staphylococcus lugdunensis NOT DETECTED NOT DETECTED Final   Streptococcus species NOT DETECTED NOT DETECTED Final   Streptococcus agalactiae NOT DETECTED NOT DETECTED Final   Streptococcus pneumoniae NOT DETECTED NOT DETECTED Final   Streptococcus pyogenes NOT DETECTED NOT DETECTED Final   A.calcoaceticus-baumannii NOT DETECTED NOT DETECTED Final   Bacteroides fragilis NOT DETECTED NOT DETECTED Final   Enterobacterales NOT DETECTED NOT DETECTED Final   Enterobacter cloacae complex NOT DETECTED NOT DETECTED Final   Escherichia coli NOT DETECTED NOT DETECTED Final   Klebsiella aerogenes NOT DETECTED NOT DETECTED Final   Klebsiella oxytoca NOT DETECTED NOT DETECTED Final   Klebsiella pneumoniae NOT DETECTED NOT DETECTED Final   Proteus species NOT DETECTED NOT DETECTED Final   Salmonella species NOT DETECTED NOT DETECTED Final   Serratia marcescens NOT DETECTED NOT DETECTED Final   Haemophilus influenzae NOT DETECTED NOT DETECTED Final   Neisseria meningitidis NOT DETECTED NOT DETECTED Final   Pseudomonas aeruginosa DETECTED (A) NOT DETECTED Final    Comment: CRITICAL RESULT CALLED TO, READ BACK BY AND VERIFIED WITH: A. PHAM PHARMD, AT 1247 09/09/20 BY D. VANHOOK    Stenotrophomonas maltophilia NOT DETECTED NOT DETECTED Final   Candida albicans NOT DETECTED NOT DETECTED Final   Candida auris NOT DETECTED NOT DETECTED Final   Candida glabrata NOT DETECTED NOT DETECTED Final   Candida krusei NOT DETECTED NOT DETECTED Final    Candida parapsilosis NOT DETECTED NOT DETECTED Final   Candida tropicalis NOT DETECTED NOT DETECTED Final   Cryptococcus neoformans/gattii NOT DETECTED NOT DETECTED Final   CTX-M ESBL NOT DETECTED NOT DETECTED Final   Carbapenem resistance IMP NOT DETECTED NOT DETECTED Final   Carbapenem resistance KPC NOT DETECTED NOT DETECTED Final   Carbapenem resistance NDM NOT DETECTED NOT DETECTED Final   Carbapenem resistance VIM NOT DETECTED NOT DETECTED Final    Comment: Performed at [facility] [address], [place], [place] [address]  Urine culture     Status: Abnormal   Collection Time: 09/08/20  7:39 PM   Specimen: Urine, Catheterized  Result Value Ref Range Status   Specimen Description   Final    URINE, CATHETERIZED Performed at [facility] [address], [place], [place] [address]    Special Requests   Final    NONE Performed at [facility], [place]  [address], [place], [place] [address]    Culture MULTIPLE SPECIES PRESENT, SUGGEST RECOLLECTION (A)  Final   Report Status 09/10/2020 FINAL  Final     Radiology Studies:  DG Chest Port 1 View  Result Date: 09/11/2020 CLINICAL DATA:  Follow-up right pneumothorax EXAM: PORTABLE CHEST 1 VIEW COMPARISON:  09/10/2020 FINDINGS: Cardiac enlargement. Right chest tube remains in place. Persistent right pneumothorax appears increased since size since previous study with persistent collapse and consolidation of the right lung. Left perihilar infiltration. Small left pleural effusion. Calcification of the aorta. IMPRESSION: Persistent right pneumothorax, increasing since previous study with persistent collapse and consolidation of the right lung. Right chest tube remains in place. Cardiac enlargement. Small left pleural effusion. Electronically Signed   By: Lucienne Capers M.D.   On: 09/11/2020 05:38   DG CHEST PORT 1 VIEW  Result Date: 09/10/2020 CLINICAL DATA:  Pneumothorax. EXAM: PORTABLE CHEST 1  VIEW COMPARISON:  Radiograph earlier today. FINDINGS: Pigtail catheter in the right hemithorax with tip in the mid upper lung zone. Slight increased size in the right pneumothorax from prior exam visualized the apex and laterally. Lucency about the right mediastinum is likely medial component. Pneumothorax appears moderate in size. No significant change in the multifocal airspace opacities in the right hemithorax. Stable heart size and mediastinal contours. Volume loss at the left lung base with hazy opacity likely pleural effusion. Streaky left suprahilar opacities are stable. There is no left pleural pneumothorax. IMPRESSION: 1. Slight increased size of right pneumothorax from prior exam, now moderate in size. Right pigtail catheter remains in place. 2. Unchanged multifocal airspace opacities in the right hemithorax. 3. Stable hazy opacity at the left lung base and suprahilar opacities. These results will be called to the ordering clinician or representative by the Radiologist Assistant, and communication documented in the PACS or [employer]. Electronically Signed   By: Keith Rake M.D.   On: 09/10/2020 17:35   DG Chest Port 1 View  Result Date: 09/10/2020 CLINICAL DATA:  Pneumothorax on the right EXAM: PORTABLE CHEST 1 VIEW COMPARISON:  Yesterday FINDINGS: Small right apical pneumothorax, 3 rib spaces in height. Chest tube is in stable position. Extensive right-sided upper lobe airspace disease since yesterday. Hazy density at the left base where there was likely pleural fluid. Streaky left perihilar density. IMPRESSION: 1. Small right apical pneumothorax with mild increase from the second film yesterday. 2. New extensive right upper lobe airspace disease, suspect re-expansion edema given the degree of right pulmonary collapse on the first film yesterday. Electronically Signed   By: Monte Fantasia M.D.   On: 09/10/2020 07:41   DG Swallowing Func-Speech Pathology  Result Date:  09/10/2020 Objective Swallowing Evaluation: Type of Study: MBS-Modified Barium Swallow Study  Patient Details Name: Tony Marshall MRN: [id number] Date of Birth: 06-May-1941 Today's Date: 09/10/2020 Time: SLP Start Time (ACUTE ONLY): 4097 -SLP Stop Time (ACUTE ONLY): 1550 SLP Time Calculation (min) (ACUTE ONLY): 20 min Past Medical History: Past Medical History: Diagnosis Date . A-fib ([place])  . CLL (chronic lymphocytic leukemia) ([place]) dx'd 2014  progression 12/2019 . Diabetes mellitus without complication ([place])  . Diverticulosis  . External hemorrhoids  . GERD (gastroesophageal reflux disease)  . Hiatal hernia  . History of colon polyps  . Hyperlipidemia  . Hypertension  . Internal hemorrhoids  . Lymphocytosis  . Schatzki's ring  . Sleep apnea  Past Surgical History: Past Surgical History: Procedure Laterality Date . BLEPHAROPLASTY   . COLONOSCOPY  2012 . FLEXIBLE SIGMOIDOSCOPY N/A  07/05/2020  Procedure: FLEXIBLE SIGMOIDOSCOPY;  Surgeon: Thornton Park, MD;  Location: [facility];  Service: Gastroenterology;  Laterality: N/A; . HEMORRHOID SURGERY    Sclerotherapy . KNEE SURGERY Right 2002 . SHOULDER SURGERY Right 6038 HPI: 79 year old male with PMH of chronic systolic CHF, CLL on chemotherapy presented with shock and pneumonia.  He developed large right-sided iatrogenic pneumothorax, now status post chest tube and PCCM continue to follow.  Hospital course complicated by ongoing poor oral intake, failure to thrive, persisting right pneumothorax, new sepsis 9/24 likely related to recurrent aspiration pneumonia.  Subjective: looks in general discomfort but alert and able to complete test Assessment / Plan / Recommendation CHL IP CLINICAL IMPRESSIONS 09/10/2020 Clinical Impression Patient presents with a mild oropharygneal dysphagia and suspected esophageal component as per patient and wife's report. Patient exhbited mild delay in anterior to posterior propulsion of thin liquids and pureer solids in oral cavity.  Pharyngeally, swallow function was [facility], with timely swallow initiation, epiglottic deflection with good airway protection, no instances of penetration or aspiration and no residuals in vallecular sinus or posterior pharyngeal wall. Patient did present with residuals in pyriform sinus which did not fully transit during MBS. Cannot r/o aspiration or penetration from pyriform residuals. SLP Visit Diagnosis Dysphagia, pharyngoesophageal phase (R13.14) Attention and concentration deficit following -- Frontal lobe and executive function deficit following -- Impact on safety and function Mild aspiration risk;Moderate aspiration risk   CHL IP TREATMENT RECOMMENDATION 09/10/2020 Treatment Recommendations Therapy as outlined in treatment plan below   Prognosis 09/10/2020 Prognosis for Safe Diet Advancement Guarded Barriers to Reach Goals Severity of deficits;Time post onset Barriers/Prognosis Comment -- CHL IP DIET RECOMMENDATION 09/10/2020 SLP Diet Recommendations Thin liquid;Regular solids Liquid Administration via Straw;Cup Medication Administration Crushed with puree Compensations Slow rate;Small sips/bites;Follow solids with liquid Postural Changes Remain semi-upright after after feeds/meals (Comment);Seated upright at 90 degrees   CHL IP OTHER RECOMMENDATIONS 09/10/2020 Recommended Consults -- Oral Care Recommendations Oral care BID Other Recommendations --   CHL IP FOLLOW UP RECOMMENDATIONS 09/10/2020 Follow up Recommendations None   CHL IP FREQUENCY AND DURATION 09/10/2020 Speech Therapy Frequency (ACUTE ONLY) min 1 x/week Treatment Duration 1 week      CHL IP ORAL PHASE 09/10/2020 Oral Phase Impaired Oral - Pudding Teaspoon -- Oral - Pudding Cup -- Oral - Honey Teaspoon -- Oral - Honey Cup -- Oral - Nectar Teaspoon -- Oral - Nectar Cup -- Oral - Nectar Straw -- Oral - Thin Teaspoon -- Oral - Thin Cup Reduced posterior propulsion Oral - Thin Straw Reduced posterior propulsion Oral - Puree Reduced posterior propulsion Oral -  Mech Soft -- Oral - Regular -- Oral - Multi-Consistency -- Oral - Pill -- Oral Phase - Comment --  CHL IP PHARYNGEAL PHASE 09/10/2020 Pharyngeal Phase Impaired Pharyngeal- Pudding Teaspoon -- Pharyngeal -- Pharyngeal- Pudding Cup -- Pharyngeal -- Pharyngeal- Honey Teaspoon -- Pharyngeal -- Pharyngeal- Honey Cup -- Pharyngeal -- Pharyngeal- Nectar Teaspoon -- Pharyngeal -- Pharyngeal- Nectar Cup -- Pharyngeal -- Pharyngeal- Nectar Straw -- Pharyngeal -- Pharyngeal- Thin Teaspoon -- Pharyngeal -- Pharyngeal- Thin Cup Pharyngeal residue - pyriform Pharyngeal -- Pharyngeal- Thin Straw Pharyngeal residue - pyriform Pharyngeal -- Pharyngeal- Puree Pharyngeal residue - pyriform Pharyngeal -- Pharyngeal- Mechanical Soft -- Pharyngeal -- Pharyngeal- Regular -- Pharyngeal -- Pharyngeal- Multi-consistency -- Pharyngeal -- Pharyngeal- Pill -- Pharyngeal -- Pharyngeal Comment --  CHL IP CERVICAL ESOPHAGEAL PHASE 09/10/2020 Cervical Esophageal Phase WFL Pudding Teaspoon -- Pudding Cup -- Honey Teaspoon -- Honey Cup -- Nectar Teaspoon -- Nectar Cup --  Nectar Straw -- Thin Teaspoon -- Thin Cup -- Thin Straw -- Puree -- Mechanical Soft -- Regular -- Multi-consistency -- Pill -- Cervical Esophageal Comment -- Sonia Baller, MA, CCC-SLP Speech Therapy               Scheduled Meds:   . insulin aspart  0-9 Units Subcutaneous TID WC  . lidocaine      . lidocaine HCl (PF)  2 mL Intradermal Once  . mouth rinse  15 mL Mouth Rinse BID  . mirtazapine  15 mg Oral QHS  . multivitamin with minerals  1 tablet Oral Daily  . pantoprazole  40 mg Oral Daily  . saccharomyces boulardii  250 mg Oral BID  . sodium chloride flush  10 mL Intracatheter Q8H  . sodium chloride flush  10 mL Intracatheter Q8H  . sodium chloride flush  10-40 mL Intracatheter Q12H  . Tbo-filgrastim (GRANIX) injection [facility]  300 mcg Subcutaneous Daily    Continuous Infusions:   . sodium chloride 10 mL/hr at 09/11/20 0600  . sodium chloride 75 mL/hr at  09/11/20 1020  . ceFEPime (MAXIPIME) IV 2 g (09/11/20 0546)     LOS: 15 days     Vernell Leep, MD, [place], [facility]. [facility]    To contact the attending provider between 7A-7P or the covering provider during after hours 7P-7A, please log into the web site [email or web address] and access using universal [place] password for that web site. If you do not have the password, please call the hospital operator.  09/11/2020, 12:37 PM

## 2020-09-11 NOTE — Plan of Care (Signed)
  Problem: Clinical Measurements: Goal: Ability to maintain clinical measurements within normal limits will improve Outcome: Progressing Goal: Will remain free from infection Outcome: Progressing Goal: Diagnostic test results will improve Outcome: Progressing Goal: Respiratory complications will improve Outcome: Progressing Goal: Cardiovascular complication will be avoided Outcome: Progressing   Problem: Elimination: Goal: Will not experience complications related to urinary retention Outcome: Progressing   Problem: Pain Managment: Goal: General experience of comfort will improve Outcome: Progressing   Problem: Safety: Goal: Ability to remain free from injury will improve Outcome: Progressing   Problem: Skin Integrity: Goal: Risk for impaired skin integrity will decrease Outcome: Progressing   

## 2020-09-11 NOTE — Progress Notes (Signed)
Right Chest tube insertion procedure started. Time out completed.

## 2020-09-11 NOTE — Progress Notes (Signed)
ID Pharmacy Note   Called lab about cefepime sensitivities on Mr. Lemmons's pseudomonas isolate. Noted that the pseudomonas is Sensitive to Cefepime with an MIC of 8 which will be covered with his current dosing scheme.    Unfortunately, this susceptibility will not cross over into Epic.    Labrian Footman, PharmD, BCPS, BCIDP Infectious Diseases Clinical Pharmacist Phone: [phone] 09/11/2020 10:19 AM

## 2020-09-11 NOTE — Progress Notes (Signed)
Chaplain met spouse and daughter in waiting area.  Because it was obvious they were distressed, stopped to spend a few minutes with them.  Established a ministry of presence by listening to their concerns.  Prayed with family for painless procedure and for strength for them.  Prayed at bedside with patient, though patient was unaware. Checked back in with family after procedure to find them subdued.  Daughter concerned her father did not look like he was resting comfortably as presently situated in the bed.  Advised family I would return on Wednesday and would check in at that time; they reported they hoped their husband/father would be discharged back home by then.  [place]

## 2020-09-11 NOTE — Procedures (Signed)
Chest Tube Insertion Procedure Note  Indications:  Clinically significant Pneumothorax  Pre-operative Diagnosis: Pneumothorax  Post-operative Diagnosis: Pneumothorax  Procedure Details  Informed consent was obtained for the procedure, including sedation.  Risks of lung perforation, hemorrhage, arrhythmia, and adverse drug reaction were discussed.   After sterile skin prep, using standard technique, a 14 French tube was placed in the right anterior 6th rib space.  Findings: Air released, draining clear fluid  Estimated Blood Loss:  5cc         Specimens:  None              Complications:  None; patient tolerated the procedure well.         Disposition: in bed         Condition: stable  Attending Attestation: I performed the procedure.

## 2020-09-11 NOTE — Progress Notes (Signed)
Right chest tube insertion procedure completed. Patient stable.

## 2020-09-11 NOTE — Progress Notes (Signed)
PT Cancellation Note  Patient Details Name: Tony Marshall MRN: [id number] DOB: [birth year]   Cancelled Treatment:    Reason Eval/Treat Not Completed: Medical issues which prohibited therapy Dr. Domingo Cocking reports pt too lethargic to participate today.  Family requests PT check back.  Will check back as schedule permits.   Tivon Lemoine,KATHrine E 09/11/2020, 1:36 PM Arlyce Dice, DPT Acute Rehabilitation Services Pager: [phone] Office: [phone]

## 2020-09-11 NOTE — Progress Notes (Signed)
HEMATOLOGY-ONCOLOGY PROGRESS NOTE  SUBJECTIVE: Resting quietly at the time visit.  No family at the bedside.  The patient underwent chest tube placement today.  No fevers recorded.  Blood cultures obtained on 9/24 positive for Pseudomonas in the aerobic bottle only in 1 set.  Remains on IV antibiotics.  He received 2 doses of Granix without significant improvement in his neutropenia.  Oncology History  CLL (chronic lymphocytic leukemia) ([place])  10/25/2013 Initial Diagnosis   CLL (chronic lymphocytic leukemia) ([place])   04/04/2020 -  Chemotherapy   The patient had palonosetron (ALOXI) injection 0.25 mg, 0.25 mg, Intravenous,  Once, 3 of 4 cycles Administration: 0.25 mg (04/04/2020), 0.25 mg (05/02/2020), 0.25 mg (05/30/2020) bendamustine (BENDEKA) 175 mg in sodium chloride 0.9 % 50 mL (3.0702 mg/mL) chemo infusion, 90 mg/m2 = 175 mg, Intravenous,  Once, 3 of 4 cycles Dose modification: 70 mg/m2 (original dose 90 mg/m2, Cycle 3, Reason: Provider Judgment, Comment: ANC ), 70 mg/m2 (original dose 90 mg/m2, Cycle 3, Reason: Other (see comments), Comment: low ANC) Administration: 175 mg (04/04/2020), 175 mg (04/05/2020), 150 mg (05/02/2020), 150 mg (05/03/2020), 125 mg (05/30/2020), 125 mg (05/31/2020) riTUXimab-pvvr (RUXIENCE) 700 mg in sodium chloride 0.9 % 250 mL (2.1875 mg/mL) infusion, 375 mg/m2 = 700 mg, Intravenous,  Once, 2 of 2 cycles Administration: 700 mg (04/04/2020)  for chemotherapy treatment.       REVIEW OF SYSTEMS:   Unable to obtain due to patient's somnolence.  I have reviewed the past medical history, past surgical history, social history and family history with the patient and they are unchanged from previous note.   PHYSICAL EXAMINATION: ECOG PERFORMANCE STATUS: 4 - Bedbound  Vitals:   09/11/20 1220 09/11/20 1348  BP: 112/67   Pulse: 98   Resp: (!) 26   Temp:  (!) 97 F (36.1 C)  SpO2: 94%    Filed Weights   09/06/20 0401 09/10/20 0339 09/11/20 0500  Weight: 54.2 kg 57 kg  58.1 kg    Intake/Output from previous day: 09/26 0701 - 09/27 0700 In: 530 [I.V.:120] Out: 850 [Urine:650; Chest Tube:200]  GENERAL: Chronically ill-appearing, cachectic, no distress SKIN: No petechiae NEURO: Sleeping, appears comfortable  LABORATORY DATA:  I have reviewed the data as listed CMP Latest Ref Rng & Units 09/11/2020 09/10/2020 09/09/2020  Glucose 70 - 99 mg/dL 165(H) 137(H) 160(H)  BUN 8 - 23 mg/dL 49(H) 41(H) 29(H)  Creatinine 0.61 - 1.24 mg/dL 1.52(H) 1.33(H) 0.89  Sodium 135 - 145 mmol/L 139 138 134(L)  Potassium 3.5 - 5.1 mmol/L 4.1 4.4 4.4  Chloride 98 - 111 mmol/L 104 100 94(L)  CO2 22 - 32 mmol/L 22 24 26   Calcium 8.9 - 10.3 mg/dL 8.0(L) 8.0(L) 8.1(L)  Total Protein 6.5 - 8.1 g/dL - - -  Total Bilirubin 0.3 - 1.2 mg/dL - - -  Alkaline Phos 38 - 126 U/L - - -  AST 15 - 41 U/L - - -  ALT 0 - 44 U/L - - -    Lab Results  Component Value Date   WBC 0.7 (LL) 09/11/2020   HGB 8.6 (L) 09/11/2020   HCT 27.5 (L) 09/11/2020   MCV 88.7 09/11/2020   PLT 89 (L) 09/11/2020   NEUTROABS 0.2 (L) 09/11/2020    DG Chest 1 View  Result Date: 09/11/2020 CLINICAL DATA:  Pneumothorax, pneumonia. EXAM: CHEST  1 VIEW COMPARISON:  Same day. FINDINGS: Stable cardiomegaly. Right-sided chest tube is again noted. Mild right apical pneumothorax is noted which is decreased compared  to prior exam. Stable bilateral lung opacities are noted concerning for pneumonia. Probable small left pleural effusion is noted. Bony thorax is unremarkable. IMPRESSION: Mild right apical pneumothorax is noted which is decreased compared to prior exam. Stable bilateral lung opacities concerning for pneumonia. Probable small left pleural effusion. Electronically Signed   By: Marijo Conception M.D.   On: 09/11/2020 13:30   DG Abd 1 View  Result Date: 08/31/2020 CLINICAL DATA:  Encounter for feeding tube placement EXAM: ABDOMEN - 1 VIEW COMPARISON:  Abdominal radiograph of 08/30/2020 FINDINGS: RIGHT-sided chest  tube remains in place. Small amount of subcutaneous emphysema. Additional chest tube has been placed since previous chest radiograph. Tip of this chest tube cannot be seen, better seen on subsequent chest radiograph. The RIGHT hemidiaphragm no longer shows depression and lucency that was noted on the previous exam. There is RIGHT and LEFT basilar airspace disease likely with LEFT-sided pleural fluid layering dependently. IMPRESSION: 1. Interval improvement with respect to basilar portion of pneumothorax seen on previous imaging following placement of a second chest tube. 2. Effusion and basilar airspace disease on the LEFT. 3. Feeding tube in the mid stomach. Electronically Signed   By: Zetta Bills M.D.   On: 08/31/2020 11:26   DG Abd 1 View  Result Date: 08/30/2020 CLINICAL DATA:  Nasogastric tube placement EXAM: ABDOMEN - 1 VIEW COMPARISON:  5:46 p.m. FINDINGS: Nasoenteric feeding tube has been partially withdrawn and its tip is now seen just beyond the expected gastroesophageal junction. Visualized abdominal gas pattern is unremarkable. Small left pleural effusion is present. Right basilar pigtail chest tube is unchanged in position. There is depression of the right hemidiaphragm in keeping with the presence of a large right pneumothorax, however, the degree of diaphragmatic depression and right lung hyperinflation has improved when compared to prior examination suggesting at least partial decompression through the chest tube. IMPRESSION: 1. Nasoenteric feeding tube has been partially withdrawn and its tip is now seen just beyond the expected gastroesophageal junction. Advancement of the catheter by at least 20 cm may more optimally position the catheter within the duodenum. 2. Improved right-sided pneumothorax, however with persistent diaphragmatic depression and right lung hyperinflation. Correlation for appropriate function of the chest tube is recommended. Electronically Signed   By: Fidela Salisbury MD    On: 08/30/2020 21:47   DG Chest Port 1 View  Result Date: 09/11/2020 CLINICAL DATA:  Follow-up right pneumothorax EXAM: PORTABLE CHEST 1 VIEW COMPARISON:  09/10/2020 FINDINGS: Cardiac enlargement. Right chest tube remains in place. Persistent right pneumothorax appears increased since size since previous study with persistent collapse and consolidation of the right lung. Left perihilar infiltration. Small left pleural effusion. Calcification of the aorta. IMPRESSION: Persistent right pneumothorax, increasing since previous study with persistent collapse and consolidation of the right lung. Right chest tube remains in place. Cardiac enlargement. Small left pleural effusion. Electronically Signed   By: Lucienne Capers M.D.   On: 09/11/2020 05:38   DG CHEST PORT 1 VIEW  Result Date: 09/10/2020 CLINICAL DATA:  Pneumothorax. EXAM: PORTABLE CHEST 1 VIEW COMPARISON:  Radiograph earlier today. FINDINGS: Pigtail catheter in the right hemithorax with tip in the mid upper lung zone. Slight increased size in the right pneumothorax from prior exam visualized the apex and laterally. Lucency about the right mediastinum is likely medial component. Pneumothorax appears moderate in size. No significant change in the multifocal airspace opacities in the right hemithorax. Stable heart size and mediastinal contours. Volume loss at the left lung base  with hazy opacity likely pleural effusion. Streaky left suprahilar opacities are stable. There is no left pleural pneumothorax. IMPRESSION: 1. Slight increased size of right pneumothorax from prior exam, now moderate in size. Right pigtail catheter remains in place. 2. Unchanged multifocal airspace opacities in the right hemithorax. 3. Stable hazy opacity at the left lung base and suprahilar opacities. These results will be called to the ordering clinician or representative by the Radiologist Assistant, and communication documented in the PACS or [employer]. Electronically  Signed   By: Keith Rake M.D.   On: 09/10/2020 17:35   DG Chest Port 1 View  Result Date: 09/10/2020 CLINICAL DATA:  Pneumothorax on the right EXAM: PORTABLE CHEST 1 VIEW COMPARISON:  Yesterday FINDINGS: Small right apical pneumothorax, 3 rib spaces in height. Chest tube is in stable position. Extensive right-sided upper lobe airspace disease since yesterday. Hazy density at the left base where there was likely pleural fluid. Streaky left perihilar density. IMPRESSION: 1. Small right apical pneumothorax with mild increase from the second film yesterday. 2. New extensive right upper lobe airspace disease, suspect re-expansion edema given the degree of right pulmonary collapse on the first film yesterday. Electronically Signed   By: Monte Fantasia M.D.   On: 09/10/2020 07:41   DG Chest Port 1 View  Result Date: 09/09/2020 CLINICAL DATA:  Follow-up chest tube and pneumothorax EXAM: PORTABLE CHEST 1 VIEW COMPARISON:  09/09/20 FINDINGS: Cardiac shadow is stable in appearance. Aortic calcifications are seen. Pigtail catheter is again seen on the right with near complete resolution of previously seen right-sided pneumothorax. Small right-sided pleural effusion is noted. Right basilar infiltrate remains. Persistent infiltrative change in left base is noted. IMPRESSION: Near complete resolution of right-sided pneumothorax. Persistent bilateral opacities with effusions. Electronically Signed   By: Inez Catalina M.D.   On: 09/09/2020 13:57   DG Chest Port 1 View  Result Date: 09/09/2020 CLINICAL DATA:  Follow-up pneumothorax. EXAM: PORTABLE CHEST 1 VIEW COMPARISON:  09/08/2020 FINDINGS: The RIGHT pneumothorax is now very large with complete collapse/atelectasis of the RIGHT lung. There is no definite midline shift. A RIGHT thoracostomy tube is again noted. LEFT lung airspace disease and LEFT LOWER lung atelectasis/consolidation again noted. No other interval change identified. IMPRESSION: Large RIGHT  pneumothorax with complete collapse/atelectasis of the RIGHT lung. RIGHT thoracostomy tube remains in place. No definite midline shift. LEFT lung airspace disease and LEFT LOWER lung atelectasis/consolidation again noted. Critical Value/emergent results were called by telephone at the time of interpretation on 09/09/2020 at 7:36 am to provider Gregary Signs, nurse for this patient, who verbally acknowledged these results. Electronically Signed   By: Margarette Canada M.D.   On: 09/09/2020 07:38   DG Chest Port 1 View  Result Date: 09/08/2020 CLINICAL DATA:  Pneumothorax. EXAM: PORTABLE CHEST 1 VIEW COMPARISON:  09/07/2020. FINDINGS: Right chest tube in stable position. Stable small right pneumothorax. Heart size normal. Multifocal bilateral pulmonary infiltrates are again noted. Small bilateral pleural effusions again noted. Degenerative change thoracic spine. IMPRESSION: 1. Right chest tube in stable position. Small right apical pneumothorax again noted without interim change. 2. Multifocal bilateral pulmonary infiltrates are again noted. Similar findings on prior exam. Small bilateral pleural effusions again noted. Electronically Signed   By: Marcello Moores  Register   On: 09/08/2020 07:47   DG CHEST PORT 1 VIEW  Result Date: 09/07/2020 CLINICAL DATA:  Recent pneumothorax with chest tube in place EXAM: PORTABLE CHEST 1 VIEW COMPARISON:  September 07, 2020 study obtained earlier in the day. FINDINGS:  Chest tube on the right is unchanged position with persistent small right apical pneumothorax. No tension component. There is a questionable small right pleural effusion, not seen earlier in the day. There is a pleural effusion on the left. There is airspace opacity in the right mid lung as well as in the left perihilar and lower lobe regions. There is apparent scarring in the left upper lobe. Heart is mildly enlarged, stable, with pulmonary vascularity grossly stable. No adenopathy appreciable by radiography. No bone lesions. There  is aortic atherosclerosis. IMPRESSION: Chest tube position on the right unchanged. Small right apical pneumothorax without tension component. Areas of airspace opacity bilaterally. Suspect new small right pleural effusion. Stable pleural effusion on the left. Stable scarring left upper lobe. Aortic Atherosclerosis (ICD10-I70.0). Electronically Signed   By: Lowella Grip III M.D.   On: 09/07/2020 10:40   DG CHEST PORT 1 VIEW  Result Date: 09/07/2020 CLINICAL DATA:  Pneumothorax.  Chest tube. EXAM: PORTABLE CHEST 1 VIEW COMPARISON:  09/06/2020. FINDINGS: Right chest tube in stable position. Stable small right apical pneumothorax. Mediastinum is stable. Heart size stable. Bilateral multifocal pulmonary infiltrates again noted. Small left pleural effusion again noted. IMPRESSION: 1. Right chest tube in stable position. Small right apical pneumothorax again noted. 2. Multifocal bilateral pulmonary infiltrates again noted without interim change. Small left pleural effusion again noted. Electronically Signed   By: Marcello Moores  Register   On: 09/07/2020 06:12   DG CHEST PORT 1 VIEW  Result Date: 09/06/2020 CLINICAL DATA:  Pleural effusion EXAM: PORTABLE CHEST 1 VIEW COMPARISON:  09/05/2020 FINDINGS: Right chest tube remains in place. Small residual right apical pneumothorax, stable. Patchy bilateral airspace disease. Small bilateral pleural effusions. IMPRESSION: No significant change since prior study. Continued patchy bilateral airspace disease, left greater than right with small effusions. Stable right apical pneumothorax. Electronically Signed   By: Rolm Baptise M.D.   On: 09/06/2020 11:47   DG CHEST PORT 1 VIEW  Result Date: 09/05/2020 CLINICAL DATA:  Pneumothorax. EXAM: PORTABLE CHEST 1 VIEW COMPARISON:  09/04/2020. FINDINGS: Interim removal of feeding tube. Right chest tube in stable position. Stable small right apical pneumothorax. Stable cardiomegaly. Bibasilar atelectasis. Bilateral pulmonary  infiltrates/edema. Slight progression from prior exam. Small bilateral pleural effusions again noted. IMPRESSION: 1. Interim removal of feeding tube. Right chest tube in stable position. Stable small right apical pneumothorax. 2.  Stable cardiomegaly. 3. Bibasilar atelectasis. Bilateral pulmonary infiltrates/edema again noted. Slight progression from prior exam. Small bilateral pleural effusions again noted. Electronically Signed   By: Marcello Moores  Register   On: 09/05/2020 05:36   DG CHEST PORT 1 VIEW  Result Date: 09/04/2020 CLINICAL DATA:  Shortness of breath, pneumothorax. EXAM: PORTABLE CHEST 1 VIEW COMPARISON:  Same day. FINDINGS: Stable cardiomediastinal silhouette. Right-sided chest tube is again noted. Small right apical pneumothorax is noted which is decreased compared to prior exam. Stable bibasilar opacities are noted with associated pleural effusions, left greater than right. Bony thorax is unremarkable. Feeding tube is seen entering stomach. IMPRESSION: Right-sided chest tube is again noted. Small right apical pneumothorax is noted which is decreased compared to prior exam. Stable bibasilar opacities are noted with associated pleural effusions, left greater than right. Electronically Signed   By: Marijo Conception M.D.   On: 09/04/2020 13:11   DG CHEST PORT 1 VIEW  Result Date: 09/04/2020 CLINICAL DATA:  Pneumothorax follow-up. EXAM: PORTABLE CHEST 1 VIEW COMPARISON:  09/03/2020 chest radiograph and prior. FINDINGS: Apically terminating right chest tube. There is a mild kink  involving the proximal tubing. Enteric tube tip overlies the gastric body. Right pneumothorax is increased in size and with new inferior component. Bilateral interstitial and patchy airspace opacities are unchanged. No left pneumothorax. Small left pleural effusion, unchanged. Partially obscured cardiomediastinal silhouette. IMPRESSION: Increased size of right pneumothorax. Indwelling apically terminating right chest tube with  mild proximal kinking. Bilateral pulmonary opacities and small left pleural effusion, unchanged. These results will be called to the ordering clinician or representative by the Radiologist Assistant, and communication documented in the PACS or [employer]. Electronically Signed   By: Primitivo Gauze M.D.   On: 09/04/2020 09:12   DG CHEST PORT 1 VIEW  Result Date: 09/03/2020 CLINICAL DATA:  Follow-up pneumothorax. EXAM: PORTABLE CHEST 1 VIEW COMPARISON:  09/02/2020 FINDINGS: Stable position of right-sided chest tube. Right-sided pneumothorax is again identified and appears similar in volume to the previous exam predominantly overlying the medial right upper lobe and right apex. Unchanged appearance of bilateral pleural effusions. Bilateral interstitial and airspace opacities are also unchanged. IMPRESSION: 1. No change in right-sided pneumothorax. 2. No change in bilateral interstitial and airspace opacities. 3. Stable bilateral pleural effusions. Electronically Signed   By: Kerby Moors M.D.   On: 09/03/2020 08:17   DG CHEST PORT 1 VIEW  Result Date: 09/02/2020 CLINICAL DATA:  Follow-up pneumothorax right side. EXAM: PORTABLE CHEST 1 VIEW COMPARISON:  09/02/2020 FINDINGS: Enteric tube unchanged coursing into the region of the stomach and off the film as tip is not visualized. Right-sided pigtail pleural drainage catheter unchanged. Previously seen right-sided pneumothorax is not visualized on the current exam. Mild interval improvement hazy opacification over the left perihilar region/left base. Persistent hazy density over the medial right base unchanged. Cardiomediastinal silhouette and remainder of the exam is unchanged. IMPRESSION: 1. Interval improvement in hazy opacification over the left perihilar region/left base. Stable hazy density medial right base. 2. Right-sided pigtail pleural drainage catheter unchanged. Previously seen right-sided pneumothorax no longer visualized. Electronically  Signed   By: Marin Olp M.D.   On: 09/02/2020 11:31   DG Chest Port 1 View  Result Date: 09/02/2020 CLINICAL DATA:  Follow-up pneumothorax. EXAM: PORTABLE CHEST 1 VIEW COMPARISON:  09/01/2020 FINDINGS: Right-sided chest tube is in place. The pigtail overlies the right upper lobe. Recurrent small to moderate right-sided pneumothorax is identified. Persistent opacification within the right mid and lower lung. Diffuse opacification of the left lung, unchanged. Small pleural effusions, stable. IMPRESSION: 1. Recurrent small to moderate right-sided pneumothorax. 2. No change in aeration to the lungs compared with previous exam. 3. Critical Value/emergent results were called by telephone at the time of interpretation on 09/02/2020 at 6:57 am to provider Nurse Karrie Doffing, who verbally acknowledged these results. Electronically Signed   By: Kerby Moors M.D.   On: 09/02/2020 06:57   DG Chest Port 1 View  Result Date: 09/01/2020 CLINICAL DATA:  Pneumothorax EXAM: PORTABLE CHEST 1 VIEW COMPARISON:  08/31/2020 FINDINGS: Interval removal of 1 of the 2 right chest tubes. No visible pneumothorax. Airspace disease throughout the left lung with possible layering left effusion, unchanged. Heart is borderline in size. IMPRESSION: Interval removal of 1 of the 2 right chest tubes without visible pneumothorax. Stable hazy opacity throughout the left lung, likely layering effusion and airspace disease. Electronically Signed   By: Rolm Baptise M.D.   On: 09/01/2020 04:46   DG CHEST PORT 1 VIEW  Result Date: 08/31/2020 CLINICAL DATA:  Encounter for chest tube placement EXAM: PORTABLE CHEST 1 VIEW COMPARISON:  Earlier  today FINDINGS: A new chest tube has been placed with tip at the apex. The right base tube is in stable position. Significant evacuation of right pneumothorax with no definite residual seen. Hazy opacification of the left chest from pleural fluid and pulmonary density. Feeding tube has been placed with  indwelling wire and weight over the stomach. Stable heart size. IMPRESSION: 1. Additional right chest tube with evacuated pneumothorax. 2. Continued hazy opacification of the left chest, likely both pleural fluid and pulmonary opacity. Electronically Signed   By: Monte Fantasia M.D.   On: 08/31/2020 11:23   DG CHEST PORT 1 VIEW  Result Date: 08/31/2020 CLINICAL DATA:  Pneumothorax EXAM: PORTABLE CHEST 1 VIEW COMPARISON:  August 31, 2020 FINDINGS: The cardiomediastinal silhouette is unchanged in contour.Atherosclerotic calcifications of the aorta. Small LEFT pleural effusion. Persistent moderate RIGHT pneumothorax, minimally decreased in comparison to prior. Persistent opacification of the RIGHT lung along the RIGHT lower hilar border. Mildly improved aeration of the LEFT lung. Patchy areas of atelectasis and edema throughout the LEFT lung. Visualized abdomen is unremarkable. Multilevel degenerative changes of the thoracic spine. IMPRESSION: 1. Persistent moderate RIGHT pneumothorax, minimally decreased to similar in comparison to prior. 2. Persistent opacification of the RIGHT lung along the RIGHT lower hilar border, likely atelectasis. 3. Mildly improved aeration of the LEFT lung. Electronically Signed   By: Valentino Saxon MD   On: 08/31/2020 09:18   DG CHEST PORT 1 VIEW  Result Date: 08/31/2020 CLINICAL DATA:  Follow-up pneumothorax. EXAM: PORTABLE CHEST 1 VIEW COMPARISON:  08/29/2020 FINDINGS: The right-sided chest tube is stable in position. Enlarging right pneumothorax now estimated at 50%. Mild shift of the heart to the left side and lower left lung volumes with progressive left lung atelectasis. IMPRESSION: Enlarging right pneumothorax now estimated at 50%. Some component of a tension pneumothorax is noted with slight shift of the heart to the left and lower left lung volumes with compressive atelectasis. These results will be called to the ordering clinician or representative by the  Radiologist Assistant, and communication documented in the PACS or [employer]. Electronically Signed   By: Marijo Sanes M.D.   On: 08/31/2020 06:41   DG CHEST PORT 1 VIEW  Result Date: 08/29/2020 CLINICAL DATA:  Follow-up pneumothorax. EXAM: PORTABLE CHEST 1 VIEW COMPARISON:  08/28/2020 FINDINGS: Right chest tube remains in place. Enlargement of the right pneumothorax. Visceral to parietal pleural density in the right upper chest 3 cm compared with 2 cm yesterday. Moderate atelectasis in both lower lobes. No left pneumothorax. Probable small effusion on the left. Mild cardiomegaly and aortic atherosclerosis/coronary artery atherosclerosis again noted. IMPRESSION: 1. Enlargement of the right pneumothorax. Right chest tube remains in place. No sign of tension or mediastinal shift. 2. Moderate atelectasis in both lower lobes. Probable small left effusion. Electronically Signed   By: Nelson Chimes M.D.   On: 08/29/2020 10:40   DG CHEST PORT 1 VIEW  Result Date: 08/28/2020 CLINICAL DATA:  Shortness of breath EXAM: PORTABLE CHEST 1 VIEW COMPARISON:  Earlier today FINDINGS: Decrease in right pneumothorax, no longer seen at the base. Moderate pneumothorax still at the right apex, although decreased at 2 cm in thickness compared to 3 cm earlier. Stable lower lobe opacity and left pleural effusion. Coronary and mitral annular calcification. IMPRESSION: Decreased right pneumothorax. Electronically Signed   By: Monte Fantasia M.D.   On: 08/28/2020 10:06   DG Chest Port 1 View  Result Date: 08/28/2020 CLINICAL DATA:  Acute respiratory failure EXAM: PORTABLE  CHEST 1 VIEW COMPARISON:  Yesterday FINDINGS: Right chest tube in place. There is a moderate right pneumothorax which is increasing at the base where hazy density is decreased and there may have been evacuation of pleural fluid. Pneumothorax thickness at the apex is similar to before at nearly 3 cm. Airspace opacity at the right lower lobe. Continued hazy  opacity at the left base from both pleural fluid and parenchymal disease. Normal heart size. No mediastinal shift. These results will be called to the ordering clinician or representative by the Radiologist Assistant, and communication documented in the PACS or [employer]. IMPRESSION: 1. Moderate right pneumothorax with interval increase at the base where there may have been interval evacuation of pleural fluid. At the apex, thickness is unchanged at 3 cm. 2. Bilateral lower lobe opacity and left pleural effusion. Electronically Signed   By: Monte Fantasia M.D.   On: 08/28/2020 05:39   DG Chest Port 1 View  Result Date: 08/27/2020 CLINICAL DATA:  RIGHT pneumothorax. EXAM: PORTABLE CHEST 1 VIEW COMPARISON:  Radiograph 08/27/2020 at 447 hours FINDINGS: Interval placement of a RIGHT pigtail catheter chest tube. Persistent RIGHT pneumothorax which measures 33 mm from the apical chest wall compared to 39 mm for minimal expansion. Persistent bilateral pleural effusions. No mediastinal shift identified. IMPRESSION: 1. Interval placement of RIGHT chest tube with persistent RIGHT pneumothorax. Mild improvement. 2. Persistent bilateral pleural effusions. Electronically Signed   By: Suzy Bouchard M.D.   On: 08/27/2020 06:27   DG Chest Port 1 View  Result Date: 08/27/2020 CLINICAL DATA:  Initial evaluation for unsuccessful central line placement. EXAM: PORTABLE CHEST 1 VIEW COMPARISON:  Prior radiograph from 08/25/2020. FINDINGS: Cardiac and mediastinal silhouettes are stable, and remain within normal limits. Aortic atherosclerosis. Lungs mildly hypoinflated. Interval development of a moderate-sized right-sided pneumothorax. No mediastinal shift. Veiling opacities overlying the bilateral hemidiaphragms consistent with pleural effusions. Superimposed patchy bibasilar opacities could reflect atelectasis or infiltrates, slightly worsened at the right lung base from previous. No acute osseous finding. IMPRESSION:  1. Interval development of moderate right-sided pneumothorax. No mediastinal shift. 2. Veiling opacities overlying the bilateral hemidiaphragms, consistent with bilateral pleural effusions. 3. Superimposed patchy bibasilar opacities, slightly worsened at the right lung base as compared to previous. Critical Value/emergent results were called by telephone at the time of interpretation on 08/27/2020 at 5:21 am to provider DAVID [facility] , who verbally acknowledged these results. Electronically Signed   By: Jeannine Boga M.D.   On: 08/27/2020 05:22   DG Chest Port 1 View  Result Date: 08/18/2020 CLINICAL DATA:  Weakness. EXAM: PORTABLE CHEST 1 VIEW COMPARISON:  08/10/2020 FINDINGS: The heart size is unremarkable. There are new moderate to large bilateral pleural effusions. There are hazy perihilar and bibasilar airspace opacities which are new from prior study. There is no pneumothorax. No acute osseous abnormality. Aortic calcifications are noted. IMPRESSION: 1. New moderate to large bilateral pleural effusions. 2. Worsening bibasilar airspace disease may represent consolidation or atelectasis. Electronically Signed   By: Constance Holster M.D.   On: 08/17/2020 17:17   DG Abd Portable 1V  Result Date: 08/30/2020 CLINICAL DATA:  Feeding tube placement EXAM: PORTABLE ABDOMEN-1 VIEW COMPARISON:  X-ray abdomen 07/07/2020, chest x-ray 08/29/2020 FINDINGS: Interval placement of enteric tube noted to course below the diaphragm with tip overlying the gastric lumen. Right pigtail catheter overlying the right mid hemithorax. Interval increase in size of a large right pneumothorax. Small left pleural effusion. Severe coronary artery calcification. IMPRESSION: 1. Enteric tube coursing below  the diaphragm with tip overlying the expected region of the gastric lumen. 2. Interval increase in size of a large pneumothorax (compared to chest x-ray 08/29/2020) with right chest tube pigtail catheter in similar position. 3.  Small left pleural effusion. Electronically Signed   By: Iven Finn M.D.   On: 08/30/2020 19:32   DG Swallowing Func-Speech Pathology  Result Date: 09/10/2020 Objective Swallowing Evaluation: Type of Study: MBS-Modified Barium Swallow Study  Patient Details Name: Tony Marshall MRN: [id number] Date of Birth: 1941/06/25 Today's Date: 09/10/2020 Time: SLP Start Time (ACUTE ONLY): 4696 -SLP Stop Time (ACUTE ONLY): 1550 SLP Time Calculation (min) (ACUTE ONLY): 20 min Past Medical History: Past Medical History: Diagnosis Date . A-fib ([place])  . CLL (chronic lymphocytic leukemia) ([place]) dx'd 2014  progression 12/2019 . Diabetes mellitus without complication ([place])  . Diverticulosis  . External hemorrhoids  . GERD (gastroesophageal reflux disease)  . Hiatal hernia  . History of colon polyps  . Hyperlipidemia  . Hypertension  . Internal hemorrhoids  . Lymphocytosis  . Schatzki's ring  . Sleep apnea  Past Surgical History: Past Surgical History: Procedure Laterality Date . BLEPHAROPLASTY   . COLONOSCOPY  2012 . FLEXIBLE SIGMOIDOSCOPY N/A 07/05/2020  Procedure: FLEXIBLE SIGMOIDOSCOPY;  Surgeon: Thornton Park, MD;  Location: [facility];  Service: Gastroenterology;  Laterality: N/A; . HEMORRHOID SURGERY    Sclerotherapy . KNEE SURGERY Right 2002 . SHOULDER SURGERY Right 4056 HPI: 79 year old male with PMH of chronic systolic CHF, CLL on chemotherapy presented with shock and pneumonia.  He developed large right-sided iatrogenic pneumothorax, now status post chest tube and PCCM continue to follow.  Hospital course complicated by ongoing poor oral intake, failure to thrive, persisting right pneumothorax, new sepsis 9/24 likely related to recurrent aspiration pneumonia.  Subjective: looks in general discomfort but alert and able to complete test Assessment / Plan / Recommendation CHL IP CLINICAL IMPRESSIONS 09/10/2020 Clinical Impression Patient presents with a mild oropharygneal dysphagia and suspected esophageal component  as per patient and wife's report. Patient exhbited mild delay in anterior to posterior propulsion of thin liquids and pureer solids in oral cavity. Pharyngeally, swallow function was [facility], with timely swallow initiation, epiglottic deflection with good airway protection, no instances of penetration or aspiration and no residuals in vallecular sinus or posterior pharyngeal wall. Patient did present with residuals in pyriform sinus which did not fully transit during MBS. Cannot r/o aspiration or penetration from pyriform residuals. SLP Visit Diagnosis Dysphagia, pharyngoesophageal phase (R13.14) Attention and concentration deficit following -- Frontal lobe and executive function deficit following -- Impact on safety and function Mild aspiration risk;Moderate aspiration risk   CHL IP TREATMENT RECOMMENDATION 09/10/2020 Treatment Recommendations Therapy as outlined in treatment plan below   Prognosis 09/10/2020 Prognosis for Safe Diet Advancement Guarded Barriers to Reach Goals Severity of deficits;Time post onset Barriers/Prognosis Comment -- CHL IP DIET RECOMMENDATION 09/10/2020 SLP Diet Recommendations Thin liquid;Regular solids Liquid Administration via Straw;Cup Medication Administration Crushed with puree Compensations Slow rate;Small sips/bites;Follow solids with liquid Postural Changes Remain semi-upright after after feeds/meals (Comment);Seated upright at 90 degrees   CHL IP OTHER RECOMMENDATIONS 09/10/2020 Recommended Consults -- Oral Care Recommendations Oral care BID Other Recommendations --   CHL IP FOLLOW UP RECOMMENDATIONS 09/10/2020 Follow up Recommendations None   CHL IP FREQUENCY AND DURATION 09/10/2020 Speech Therapy Frequency (ACUTE ONLY) min 1 x/week Treatment Duration 1 week      CHL IP ORAL PHASE 09/10/2020 Oral Phase Impaired Oral - Pudding Teaspoon -- Oral - Pudding Cup -- Oral -  Honey Teaspoon -- Oral - Honey Cup -- Oral - Nectar Teaspoon -- Oral - Nectar Cup -- Oral - Nectar Straw -- Oral - Thin  Teaspoon -- Oral - Thin Cup Reduced posterior propulsion Oral - Thin Straw Reduced posterior propulsion Oral - Puree Reduced posterior propulsion Oral - Mech Soft -- Oral - Regular -- Oral - Multi-Consistency -- Oral - Pill -- Oral Phase - Comment --  CHL IP PHARYNGEAL PHASE 09/10/2020 Pharyngeal Phase Impaired Pharyngeal- Pudding Teaspoon -- Pharyngeal -- Pharyngeal- Pudding Cup -- Pharyngeal -- Pharyngeal- Honey Teaspoon -- Pharyngeal -- Pharyngeal- Honey Cup -- Pharyngeal -- Pharyngeal- Nectar Teaspoon -- Pharyngeal -- Pharyngeal- Nectar Cup -- Pharyngeal -- Pharyngeal- Nectar Straw -- Pharyngeal -- Pharyngeal- Thin Teaspoon -- Pharyngeal -- Pharyngeal- Thin Cup Pharyngeal residue - pyriform Pharyngeal -- Pharyngeal- Thin Straw Pharyngeal residue - pyriform Pharyngeal -- Pharyngeal- Puree Pharyngeal residue - pyriform Pharyngeal -- Pharyngeal- Mechanical Soft -- Pharyngeal -- Pharyngeal- Regular -- Pharyngeal -- Pharyngeal- Multi-consistency -- Pharyngeal -- Pharyngeal- Pill -- Pharyngeal -- Pharyngeal Comment --  CHL IP CERVICAL ESOPHAGEAL PHASE 09/10/2020 Cervical Esophageal Phase WFL Pudding Teaspoon -- Pudding Cup -- Honey Teaspoon -- Honey Cup -- Nectar Teaspoon -- Nectar Cup -- Nectar Straw -- Thin Teaspoon -- Thin Cup -- Thin Straw -- Puree -- Mechanical Soft -- Regular -- Multi-consistency -- Pill -- Cervical Esophageal Comment -- Sonia Baller, MA, CCC-SLP Speech Therapy              ASSESSMENT AND PLAN: This is a very pleasant 79 year old white male with CLL currently on observation. The patient had a rising WBC close to 100,000 and worsening anemia due to his underlying CLL and received 3 cycles of bendamustine and Rituxan. He had a lot of difficulty tolerating this treatment and it was discontinued after 3 cycles. He has had multiple hospitalizations over the past few months. Currently admitted with septic shock, probable aspiration pneumonia, and pneumothorax. He has developed pancytopenia during  his hospitalization. Labs from admission today have been reviewed and his white blood cell count and platelets were completely normal on admission. He had his baseline anemia on admission.  The patient received 2 doses of Granix without significant improvement in his neutropenia.  He was restarted on IV antibiotics due to recurrent sepsis.  CBC from today has been reviewed and WBC is 0.7 and ANC is 0.2.  Platelets have drifted down some to 89,000.  Hemoglobin remains overall stable.  The patient has persistent bone marrow suppression from repeated sepsis and medications likely contributing.  Discussed with Dr. Julien Nordmann and we will restart Granix 300 mcg subcu daily.  I have ordered this for 3 days    Discussed with Dr. Domingo Cocking of the palliative care team and patient will be discharged home with hospice, but until the time of discharge, family wishes to continue treating any potential treatable conditions. Recommend PRBC transfusion for hemoglobin less than 7 and platelets for platelet count less than 20,000 or active bleeding.   LOS: 15 days   Mikey Bussing, DNP, AGPCNP-BC, AOCNP 09/11/20

## 2020-09-11 NOTE — Progress Notes (Signed)
NUTRITION NOTE  RN stopped RD to ask about Calorie Count follow-up for this patient after Calorie Count was ordered on 9/24. This RD was off on 9/24 and 9/25 and unfamiliar with this patient.   Able to retrieve Calorie Count envelope from patient's room and briefly talk with his wife, who is at bedside. She reports that patient has not been eating much and many meals he has not eaten at all. She states that he does not like [employer] (will discontinue this).   There were 4 meal tickets in Calorie Count envelope and on each of the 4 "did not eat/refused to eat" was written.      Jarome Matin, MS, RD, LDN, CNSC Inpatient Clinical Dietitian RD pager # available in [facility]  After hours/weekend pager # available in [facility]

## 2020-09-11 NOTE — Progress Notes (Signed)
Daily Progress Note   Patient Name: Tony Marshall       Date: 09/11/2020 DOB: 11/16/1941  Age: 79 y.o. MRN#: [id number] Attending Physician: Modena Jansky, MD Primary Care Physician: Timoteo Gaul, FNP Admit Date: 09/12/2020  Reason for Consultation/Follow-up: Establishing goals of care  Subjective: I saw and examined Mr. Wisser today.  He was lying in bed in no distress.  Wife and daughter at bedside.  He is sleepy today and arouses but immediately falls back to sleep.  Discussed PCCM recommendation for replacing chest tube.  Family would like to proceed with this.  I answered his questions about care plan here in the hospital and hope to transition home with hospice later this week.  We discussed that hospice will continue to follow peripherally to discuss plan for discharge home with hospice once we have a better idea how he does clinically of the next couple of days.  Length of Stay: 15  Current Medications: Scheduled Meds:  . insulin aspart  0-9 Units Subcutaneous TID WC  . mouth rinse  15 mL Mouth Rinse BID  . mirtazapine  15 mg Oral QHS  . multivitamin with minerals  1 tablet Oral Daily  . pantoprazole  40 mg Oral Daily  . saccharomyces boulardii  250 mg Oral BID  . sodium chloride flush  10 mL Intracatheter Q8H  . sodium chloride flush  10-40 mL Intracatheter Q12H  . Tbo-filgrastim (GRANIX) injection [facility]  300 mcg Subcutaneous Daily    Continuous Infusions: . sodium chloride 10 mL/hr at 09/11/20 0600  . sodium chloride 75 mL/hr at 09/11/20 1020  . ceFEPime (MAXIPIME) IV 2 g (09/11/20 0546)    PRN Meds: sodium chloride, acetaminophen (TYLENOL) oral liquid 160 mg/5 mL, ALPRAZolam, benzonatate, guaiFENesin-dextromethorphan, hydrALAZINE, lip balm, sodium  chloride flush  Physical Exam         General: Sleepy and not very interactive today.  Weak and frail. HEENT: No bruits, no goiter, no JVD Heart: Regular rate and rhythm. No murmur appreciated. Lungs: Diminished air movement, crackles in base on L Abdomen: Soft, nontender, nondistended, positive bowel sounds.  Ext: +edema Skin: Warm and dry Neuro: Grossly intact, nonfocal.   Vital Signs: BP 112/72 (BP Location: Left Arm)   Pulse 84   Temp (!) 97.3 F (36.3 C) (Oral)  Resp 20   Ht 5' 7.5" (1.715 m)   Wt 58.1 kg   SpO2 98%   BMI 19.77 kg/m  SpO2: SpO2: 98 % O2 Device: O2 Device: Nasal Cannula O2 Flow Rate: O2 Flow Rate (L/min): 3 L/min  Intake/output summary:   Intake/Output Summary (Last 24 hours) at 09/11/2020 1056 Last data filed at 09/11/2020 0600 Gross per 24 hour  Intake 530 ml  Output 850 ml  Net -320 ml   LBM: Last BM Date: 09/10/20 Baseline Weight: Weight: 52.3 kg Most recent weight: Weight: 58.1 kg       Palliative Assessment/Data:    Flowsheet Rows     Most Recent Value  Intake Tab  Referral Department Hospitalist  Unit at Time of Referral Med/Surg Unit  Palliative Care Primary Diagnosis Other (Comment)  [FTT]  Date Notified 09/03/20  Palliative Care Type Return patient Palliative Care  Reason for referral Clarify Goals of Care  Date of Admission 08/30/2020  Date first seen by Palliative Care 09/04/20  # of days Palliative referral response time 1 Day(s)  # of days IP prior to Palliative referral 8  Clinical Assessment  Psychosocial & Spiritual Assessment  Palliative Care Outcomes      Patient Active Problem List   Diagnosis Date Noted  . Pneumothorax on right   . Pneumonia due to infectious organism   . Severe protein-calorie malnutrition ([place])   . Hypokalemia   . Hypomagnesemia   . Hypophosphatemia   . Acute metabolic encephalopathy   . Pressure injury of skin 08/30/2020  . Septic shock ([place]) 08/27/2020  . Iatrogenic pneumothorax on  R  08/27/2020  . AKI (acute kidney injury) ([place]) 08/27/2020  . Hypotension due to hypovolemia   . Advanced care planning/counseling discussion   . Palliative care by specialist   . Cough   . DNR (do not resuscitate)   . Physical deconditioning 07/19/2020  . Stercoral ulcer of rectum   . Prolonged QT interval 07/03/2020  . Atrial fibrillation ([place])   . Shock circulatory ([place]) 07/01/2020  . Lower GI bleed   . Central venous catheter in place   . Fecal impaction ([place])   . Overflow diarrhea   . Rectal bleeding   . DKA (diabetic ketoacidoses) ([place]) 06/28/2020  . Systolic and diastolic CHF, chronic ([place]) 06/28/2020  . Anemia associated with acute blood loss   . Dehydration 06/27/2020  . Protein-calorie malnutrition, severe ([place]) 06/09/2020  . Hyperglycemia due to diabetes mellitus ([place])   . Other pancytopenia ([place])   . Acute combined systolic and diastolic heart failure ([place])   . Cardiogenic shock ([employer])   . Altered mental status   . Failure to thrive in adult   . Symptomatic anemia   . Acute respiratory failure ([place])   . Increased anion gap metabolic acidosis 14/97/0263  . Goals of care, counseling/discussion 03/28/2020  . Encounter for antineoplastic chemotherapy 03/28/2020  . GERD (gastroesophageal reflux disease) 05/13/2014  . History of colonic polyps 05/13/2014  . Hiatal hernia 05/13/2014  . CLL (chronic lymphocytic leukemia) ([place]) 10/25/2013  . ED (erectile dysfunction) 10/20/2013  . Lymphocytosis 09/23/2013  . Essential hypertension 07/21/2013  . Pure hypercholesterolemia 07/21/2013  . Diabetes mellitus ([place]) 07/20/2013    Palliative Care Assessment & Plan  Recommendations/Plan:  Plan to continue to treat his treatable conditions this admission.  Family would like to continue treatment for current infection and medically maximize him prior to discharge.  Discussed potential chest tube replacement.  Family would like to proceed.  When he does discharge home, plan is  for transition home with the support of hospice.  [facility] also now following inpatient and will continue to coordinate plans for discharge home with hospice with them based upon his clinical course of the next few days.  Code Status:    Code Status Orders  (From admission, onward)         Start     Ordered   08/27/20 0059  Limited resuscitation (code)  Continuous       Question Answer Comment  In the event of cardiac or respiratory ARREST: Initiate Code Blue, Call Rapid Response Yes   In the event of cardiac or respiratory ARREST: Perform CPR No   In the event of cardiac or respiratory ARREST: Perform Intubation/Mechanical Ventilation Yes   In the event of cardiac or respiratory ARREST: Use NIPPV/BiPAp only if indicated Yes   In the event of cardiac or respiratory ARREST: Administer ACLS medications if indicated No   In the event of cardiac or respiratory ARREST: Perform Defibrillation or Cardioversion if indicated No      08/27/20 0059        Code Status History    Date Active Date Inactive Code Status Order ID Comments User Context   07/20/2020 1532 07/21/2020 2120 DNR [id number]  Earlie Counts, NP Inpatient   07/19/2020 1949 07/20/2020 1532 Full Code [id number]  Clarnce Flock, MD ED   06/28/2020 1122 07/11/2020 1752 Full Code [id number]  Donne Hazel, MD ED   06/05/2020 1839 06/21/2020 0024 Full Code [id number]  Cherene Altes, MD ED   Advance Care Planning Activity       Prognosis:  Guarded  Discharge Planning:  Home with Hospice once medically ready  Care plan was discussed with patient, wife, hospice liaison  Thank you for allowing the Palliative Medicine Team to assist in the care of this patient.   Time In: 1025 Time Out: 1050 Total Time 25 Prolonged Time Billed  No   Greater than 50%  of this time was spent counseling and coordinating care related to the above assessment and plan.   Micheline Rough, MD  Please contact Palliative Medicine Team phone at  [phone] for questions and concerns.

## 2020-09-11 NOTE — Progress Notes (Signed)
SLP Cancellation Note  Patient Details Name: YASSEN KINNETT MRN: [id number] DOB: December 05, 1941   Cancelled treatment:       Reason Eval/Treat Not Completed: Other (comment) (per notes, pt lethargic today and has repeatedly been refusing po intake, will continue efforts)  Kathleen Lime, MS [facility] SLP Acute Rehab Services Office [phone]  [name] 09/11/2020, 5:00 PM

## 2020-09-11 NOTE — Plan of Care (Signed)
  Problem: Education: Goal: Knowledge of General Education information will improve Description: Including pain rating scale, medication(s)/side effects and non-pharmacologic comfort measures Outcome: Progressing   Problem: Clinical Measurements: Goal: Ability to maintain clinical measurements within normal limits will improve Outcome: Progressing Goal: Will remain free from infection Outcome: Progressing Goal: Diagnostic test results will improve Outcome: Progressing Goal: Respiratory complications will improve Outcome: Progressing   Problem: Activity: Goal: Risk for activity intolerance will decrease Outcome: Progressing   Problem: Nutrition: Goal: Adequate nutrition will be maintained Outcome: Progressing   Problem: Coping: Goal: Level of anxiety will decrease Outcome: Progressing   Problem: Elimination: Goal: Will not experience complications related to bowel motility Outcome: Progressing Goal: Will not experience complications related to urinary retention Outcome: Progressing   Problem: Pain Managment: Goal: General experience of comfort will improve Outcome: Progressing   Problem: Safety: Goal: Ability to remain free from injury will improve Outcome: Progressing   Problem: Skin Integrity: Goal: Risk for impaired skin integrity will decrease Outcome: Progressing   

## 2020-09-12 ENCOUNTER — Inpatient Hospital Stay (HOSPITAL_COMMUNITY): Payer: Medicare HMO

## 2020-09-12 DIAGNOSIS — J9383 Other pneumothorax: Secondary | ICD-10-CM

## 2020-09-12 DIAGNOSIS — T829XXA Unspecified complication of cardiac and vascular prosthetic device, implant and graft, initial encounter: Secondary | ICD-10-CM

## 2020-09-12 DIAGNOSIS — I9589 Other hypotension: Secondary | ICD-10-CM

## 2020-09-12 DIAGNOSIS — D649 Anemia, unspecified: Secondary | ICD-10-CM

## 2020-09-12 DIAGNOSIS — E861 Hypovolemia: Secondary | ICD-10-CM

## 2020-09-12 LAB — CBC WITH DIFFERENTIAL/PLATELET
Abs Immature Granulocytes: 0.02 10*3/uL (ref 0.00–0.07)
Basophils Absolute: 0 10*3/uL (ref 0.0–0.1)
Basophils Relative: 1 %
Eosinophils Absolute: 0 10*3/uL (ref 0.0–0.5)
Eosinophils Relative: 0 %
HCT: 29.2 % — ABNORMAL LOW (ref 39.0–52.0)
Hemoglobin: 8.8 g/dL — ABNORMAL LOW (ref 13.0–17.0)
Immature Granulocytes: 2 %
Lymphocytes Relative: 32 %
Lymphs Abs: 0.4 10*3/uL — ABNORMAL LOW (ref 0.7–4.0)
MCH: 28.2 pg (ref 26.0–34.0)
MCHC: 30.1 g/dL (ref 30.0–36.0)
MCV: 93.6 fL (ref 80.0–100.0)
Monocytes Absolute: 0.1 10*3/uL (ref 0.1–1.0)
Monocytes Relative: 8 %
Neutro Abs: 0.8 10*3/uL — ABNORMAL LOW (ref 1.7–7.7)
Neutrophils Relative %: 57 %
Platelets: 67 10*3/uL — ABNORMAL LOW (ref 150–400)
RBC: 3.12 MIL/uL — ABNORMAL LOW (ref 4.22–5.81)
RDW: 22.5 % — ABNORMAL HIGH (ref 11.5–15.5)
WBC: 1.3 10*3/uL — CL (ref 4.0–10.5)
nRBC: 0 % (ref 0.0–0.2)

## 2020-09-12 LAB — BASIC METABOLIC PANEL
Anion gap: 19 — ABNORMAL HIGH (ref 5–15)
BUN: 53 mg/dL — ABNORMAL HIGH (ref 8–23)
CO2: 14 mmol/L — ABNORMAL LOW (ref 22–32)
Calcium: 8.2 mg/dL — ABNORMAL LOW (ref 8.9–10.3)
Chloride: 107 mmol/L (ref 98–111)
Creatinine, Ser: 1.64 mg/dL — ABNORMAL HIGH (ref 0.61–1.24)
GFR calc Af Amer: 46 mL/min — ABNORMAL LOW (ref >60)
GFR calc non Af Amer: 40 mL/min — ABNORMAL LOW (ref >60)
Glucose, Bld: 182 mg/dL — ABNORMAL HIGH (ref 70–99)
Potassium: 3.7 mmol/L (ref 3.5–5.1)
Sodium: 140 mmol/L (ref 135–145)

## 2020-09-12 LAB — GLUCOSE, CAPILLARY
Glucose-Capillary: 170 mg/dL — ABNORMAL HIGH (ref 70–99)
Glucose-Capillary: 168 mg/dL — ABNORMAL HIGH (ref 70–99)

## 2020-09-12 LAB — MAGNESIUM: Magnesium: 2.2 mg/dL (ref 1.7–2.4)

## 2020-09-12 MED ORDER — ONDANSETRON 4 MG PO TBDP: 4.0000 mg | ORAL_TABLET | Freq: Four times a day (QID) | ORAL | Status: DC | PRN

## 2020-09-12 MED ORDER — ONDANSETRON HCL 4 MG/2ML IJ SOLN: 4.0000 mg | Freq: Four times a day (QID) | INTRAMUSCULAR | Status: DC | PRN

## 2020-09-12 MED ORDER — HALOPERIDOL 0.5 MG PO TABS
0.5000 mg | ORAL_TABLET | ORAL | Status: DC | PRN
  Filled 2020-09-12: qty 1

## 2020-09-12 MED ORDER — POLYVINYL ALCOHOL 1.4 % OP SOLN
1.0000 [drp] | Freq: Four times a day (QID) | OPHTHALMIC | Status: DC | PRN
  Administered 2020-09-12: 1 [drp] via OPHTHALMIC
  Filled 2020-09-12 (×2): qty 15

## 2020-09-12 MED ORDER — GLYCOPYRROLATE 0.2 MG/ML IJ SOLN: 0.2000 mg | INTRAMUSCULAR | Status: DC | PRN

## 2020-09-12 MED ORDER — SCOPOLAMINE 1 MG/3DAYS TD PT72
1.0000 | MEDICATED_PATCH | TRANSDERMAL | Status: DC
  Administered 2020-09-12: 1.5 mg via TRANSDERMAL
  Filled 2020-09-12: qty 1

## 2020-09-12 MED ORDER — HALOPERIDOL LACTATE 5 MG/ML IJ SOLN: 0.5000 mg | INTRAMUSCULAR | Status: DC | PRN

## 2020-09-12 MED ORDER — HALOPERIDOL LACTATE 2 MG/ML PO CONC
0.5000 mg | ORAL | Status: DC | PRN
  Filled 2020-09-12: qty 0.3

## 2020-09-12 MED ORDER — ACETAMINOPHEN 325 MG PO TABS: 650.0000 mg | ORAL_TABLET | Freq: Four times a day (QID) | ORAL | Status: DC | PRN

## 2020-09-12 MED ORDER — BIOTENE DRY MOUTH MT LIQD: 15.0000 mL | OROMUCOSAL | Status: DC | PRN

## 2020-09-12 MED ORDER — HYDROMORPHONE HCL 1 MG/ML IJ SOLN: 0.5000 mg | INTRAMUSCULAR | Status: DC | PRN

## 2020-09-12 MED ORDER — GLYCOPYRROLATE 1 MG PO TABS: 1.0000 mg | ORAL_TABLET | ORAL | Status: DC | PRN

## 2020-09-12 MED ORDER — ACETAMINOPHEN 650 MG RE SUPP: 650.0000 mg | Freq: Four times a day (QID) | RECTAL | Status: DC | PRN

## 2020-09-12 MED ORDER — ATROPINE SULFATE 1 % OP SOLN
2.0000 [drp] | OPHTHALMIC | Status: DC | PRN
  Filled 2020-09-12: qty 2

## 2020-09-12 NOTE — Progress Notes (Signed)
Chaplain paged by nurse. Patient beginning comfort care. Wife Tony Marshall in the hallway, said "he told me he was dying last night. I didn't believe it. We have been here so many times, I didn't think it would happen this time." Daughter Tony Marshall has come now.  Chaplain offering support and prayer. Rev. Tamsen Snider Pager [phone]

## 2020-09-12 NOTE — Plan of Care (Signed)
  Problem: Education: Goal: Knowledge of General Education information will improve Description Including pain rating scale, medication(s)/side effects and non-pharmacologic comfort measures Outcome: Progressing   

## 2020-09-12 NOTE — Progress Notes (Addendum)
Notified by 4th floor charge RN that patient was desaturating to mid 70s on NRB, agonally breathing, very lethargic. Notified MD Olalere to come to beside due to patients deteriorating status. MD Olalere ordered for BIPAP to be started and discussed code status with wife at bedside. Patient made DNR after discussions with wife. Waiting for daughter to arrive to provide comfort care.

## 2020-09-12 NOTE — Progress Notes (Signed)
Received order  to discontinue  BiPAP, RT notified and will add O2. SRP, RN

## 2020-09-12 NOTE — Progress Notes (Signed)
MD spoke with family. Will cont to monitor pt. SRP, RN

## 2020-09-12 NOTE — Progress Notes (Signed)
    NAME:  Tony Marshall, MRN:  [id number], DOB:  Sep 07, 1941, LOS: 33 ADMISSION DATE:  08/25/2020, CONSULTATION DATE:  09/12/20 REFERRING MD:  EDP, CHIEF COMPLAINT:  lethargy  Brief History   78 yowm, never smoker, with history of HFrEF and CLL s/p three cycles chemo and rx avelox since 8/27 for PNA who presented with lethargy and poor PO intake.  Found to have worsening bibasilar airspace disease and pleural effusions with hypotension despite 3.5L IVF. PCCM consulted for admission. Labs were significant for WBC 3.3, Hgb 6.1 down from baseline of 9 several weeks ago, Lactic acid 3.2, Covid-19 negative. Course complicated by right pneumothorax after right IJ attempt.  Past Medical History  Atrial fibrillation not on anticoagulation HFrEF CLL s/p 3 cycles chemo DM HTN GERD OSA  Significant Hospital Events   9/11 Admit to PCCM 9/15 weaned off vasopressors ,increasing delirium, pulled out his central line  9/17 transfer to [facility], PCCM following for chest tube 9/20 Residual chest tube air leak 1/7 9/25 larger Rt pneumothorax, change suction to -30 cm water 9/27: still with persistent pneumothorax 9/28 good reexpansion of the lung, small apical pneumo Consults:  Palliative care  Procedures:  9/12 RIJ attempted, R pigtail chest tube by ED >> 9/16 removed since non functional 9/12 L Fem CVL >> 9/14 , pulled out 9/16 RT pigtail >> 9/28 chest tube replaced Significant Diagnostic Tests:     Micro Data:  Sars-Cov-2 PCR 9/11 > negative Blood Cx  9/12 > negative Blood 9/24 >> Pseudomonas aeruginosa  Antimicrobials:  Zozyn 9/11 only Vancomycin 9/11 >> 9/13 Cefepime 9/12 >> 9/18  Cefepime 9/24 >>   Interim history/subjective:  Coughing No airleak, significant fluid  Objective   Blood pressure (!) 143/70, pulse 94, temperature 98.1 F (36.7 C), temperature source Axillary, resp. rate 20, height 5' 7.5" (1.715 m), weight 55 kg, SpO2 98 %.        Intake/Output Summary (Last  24 hours) at 09/12/2020 0919 Last data filed at 09/12/2020 0600 Gross per 24 hour  Intake 1446.05 ml  Output 250 ml  Net 1196.05 ml   Exam:   General -cachectic, responsive, coughing eyes -pupils reactive ENT -moist oral mucosa Cardiac -S1-S2 appreciated Chest -decreased air entry bilaterally with rhonchi Abdomen -bowel sounds appreciated Extremities - decreased muscle bulk Skin - no rashes Neuro -more interactive  Post chest tube placement with reexpansion of the lung  Assessment & Plan:  Iatrogenic right pneumothorax -S/p chest tube 9/12, replaced 9/16, replaced 9/28 -Decreasing suction to -10 led to significant pneumothorax recurrence on the 25th  Discharging home with chest tube to waterseal is a possibility according to hospice care -Palliative care continues to see patient  Pseudomonal bacteremia related to HAP -On antibiotics 9/24  Pancytopenia Related to CLL s/p chemotherapy Per primary care service and oncology  Ongoing goals of care discussions  Discussed with spouse at bedside   Sherrilyn Rist, MD [facility] P[facility]M Pager: [phone]

## 2020-09-12 NOTE — Progress Notes (Signed)
Chaplain followed up with patient and family.  Patient's wife sister and son also bedside with wife and daughter.  Chaplain offered prayer. Rev. Tamsen Snider Pager [phone]

## 2020-09-12 NOTE — Progress Notes (Signed)
PROGRESS NOTE    KOOPER GODSHALL  LNL:[id number] DOB: 06/30/1941 DOA: 08/27/2020 PCP: Timoteo Gaul, FNP  Brief Narrative:79 year old male with PMH of chronic systolic CHF, CLL on chemotherapy presented with shock and pneumonia.  He developed large right-sided iatrogenic pneumothorax, now status post chest tube and PCCM continue to follow.  Hospital course complicated by ongoing poor oral intake, failure to thrive, persisting right pneumothorax, new sepsis 9/24 secondary to Pseudomonas bacteremia from likely aspiration pneumonia.  Overall continues to do poorly.   Assessment & Plan:   Active Problems:   CLL (chronic lymphocytic leukemia) (HCC)   Acute respiratory failure (HCC)   Other pancytopenia (HCC)   Acute combined systolic and diastolic heart failure (HCC)   Shock circulatory (HCC)   Septic shock (HCC)   Iatrogenic pneumothorax on R    AKI (acute kidney injury) ([facility])   Pressure injury of skin   Pneumothorax on right   Pneumonia due to infectious organism   Severe protein-calorie malnutrition (HCC)   Hypokalemia   Hypomagnesemia   Hypophosphatemia   Acute metabolic encephalopathy   Acute pneumothorax   #1  Acute hypoxic respiratory failure secondary to iatrogenic pneumothorax pneumonia and severe deconditioning patient desaturated to 70s today.  Chest tube still in place.  Appreciate PCCM input and palliative care input.  It was decided after discussion with patient's wife by PCCM that patient be made not to intubate but would do a trial of BiPAP.  However patient continued to struggle on BiPAP without significant improvement.  After discussion with patient's wife and daughter BiPAP was DC'd and patient was kept or changed to comfort care.  Appreciate chaplain support.  His wife did not want him to be suffering any longer.  #2 sepsis with febrile neutropenia and Pseudomonas bacteremia and recurrent aspiration pneumonia  #3 status post septic shock secondary to pneumonia  present on admission resolved  #4 AKI  #5 type 2 diabetes  #6 multiple electrolyte abnormalities  #7 acute metabolic encephalopathy  #8 CLL  #9 pancytopenia  #10 goals of care discussed with patient's wife and daughter patient has been made comfort care BiPAP DC'd agree with their decision as patient has multiple comorbidities very frail with no improvement in the last 17 days.  #11 multiple pressure injuries  #12 failure to thrive/severe malnutrition fat depletion muscle depletion  Pressure Injury 08/27/20 Sacrum Medial Stage 2 -  Partial thickness loss of dermis presenting as a shallow open injury with a red, pink wound bed without slough. Red area with small area of broken skin in center (Active)  08/27/20 1024  Location: Sacrum  Location Orientation: Medial  Staging: Stage 2 -  Partial thickness loss of dermis presenting as a shallow open injury with a red, pink wound bed without slough.  Wound Description (Comments): Red area with small area of broken skin in center  Present on Admission: Yes     Pressure Injury 08/27/20 Nose Left Deep Tissue Pressure Injury - Purple or maroon localized area of discolored intact skin or blood-filled blister due to damage of underlying soft tissue from pressure and/or shear. (Active)  08/27/20 1024  Location: Nose  Location Orientation: Left  Staging: Deep Tissue Pressure Injury - Purple or maroon localized area of discolored intact skin or blood-filled blister due to damage of underlying soft tissue from pressure and/or shear.  Wound Description (Comments):   Present on Admission: Yes      Nutrition Problem: Severe Malnutrition Etiology: chronic illness, cancer and cancer related treatments  Signs/Symptoms: moderate fat depletion, moderate muscle depletion, severe fat depletion, severe muscle depletion    Interventions: Magic cup, Tube feeding, Other (Comment) ([employer] TF)  Estimated body mass index is 18.71 kg/m as  calculated from the following:   Height as of this encounter: 5' 7.5" (1.715 m).   Weight as of this encounter: 55 kg.   Dispo: The patient is from: Home              Anticipated d/c is to: Expecting hospital death    Subjective:  Initially when I saw him he was constantly coughing he had a weak cough not strong enough to cough up anything 2 hours later patient started desaturating BiPAP tried without any improvement Patient made comfort care after discussion with family and PCCM Objective: Vitals:   09/11/20 2000 09/12/20 0417 09/12/20 0619 09/12/20 1105  BP: 126/66  (!) 143/70   Pulse: 86  94 94  Resp: (!) 23  20 (!) 28  Temp:   98.1 F (36.7 C)   TempSrc:   Axillary   SpO2: 98%  98% 94%  Weight:  55 kg    Height:        Intake/Output Summary (Last 24 hours) at 09/12/2020 1616 Last data filed at 09/12/2020 1500 Gross per 24 hour  Intake 1111.05 ml  Output 400 ml  Net 711.05 ml   Filed Weights   09/10/20 0339 09/11/20 0500 09/12/20 0417  Weight: 57 kg 58.1 kg 55 kg    Examination:  General exam: Appears calm and comfortable  Respiratory system: Coarse to auscultation. Respiratory effort normal. Cardiovascular system: S1 & S2 heard, RRR. No JVD, murmurs, rubs, gallops or clicks. No pedal edema. Gastrointestinal system: Abdomen is nondistended, soft and nontender. No organomegaly or masses felt. Normal bowel sounds heard. Central nervous system: Not awake alert or oriented extremities: Symmetric 5 x 5 power. Skin: No rashes, lesions or ulcers Psychiatry: Unable to assess   Data Reviewed: I have personally reviewed following labs and imaging studies  CBC: Recent Labs  Lab 09/08/20 0543 09/09/20 0421 09/10/20 0455 09/11/20 0436 09/12/20 0537  WBC 0.4* 0.7* 0.5* 0.7* 1.3*  NEUTROABS 0.1* 0.1* 0.1* 0.2* 0.8*  HGB 8.4* 9.2* 8.9* 8.6* 8.8*  HCT 26.3* 29.1* 27.9* 27.5* 29.2*  MCV 88.0 89.0 87.2 88.7 93.6  PLT 82* 128* 106* 89* 67*   Basic Metabolic  Panel: Recent Labs  Lab 09/06/20 0616 09/06/20 0616 09/07/20 0747 09/07/20 0747 09/08/20 0543 09/09/20 0421 09/10/20 0455 09/11/20 0436 09/12/20 0537  NA 134*   < > 136   < > 135 134* 138 139 140  K 2.1*   < > 3.0*   < > 3.2* 4.4 4.4 4.1 3.7  CL 92*   < > 92*   < > 93* 94* 100 104 107  CO2 30   < > 33*   < > 30 26 24 22  14*  GLUCOSE 140*   < > 125*   < > 99 160* 137* 165* 182*  BUN 20   < > 20   < > 20 29* 41* 49* 53*  CREATININE 0.57*   < > 0.64   < > 0.58* 0.89 1.33* 1.52* 1.64*  CALCIUM 7.6*   < > 8.2*   < > 7.7* 8.1* 8.0* 8.0* 8.2*  MG 1.5*   < > 1.7   < > 1.6* 2.3 2.3 2.2 2.2  PHOS 2.4*  --  1.8*  --  3.0 3.6  --   --   --    < > =  values in this interval not displayed.   GFR: Estimated Creatinine Clearance: 29.8 mL/min (A) (by C-G formula based on SCr of 1.64 mg/dL (H)). Liver Function Tests: Recent Labs  Lab 09/07/20 0747 09/08/20 0543 09/09/20 0421  ALBUMIN 2.2* 2.1* 2.3*   No results for input(s): LIPASE, AMYLASE in the last 168 hours. No results for input(s): AMMONIA in the last 168 hours. Coagulation Profile: No results for input(s): INR, PROTIME in the last 168 hours. Cardiac Enzymes: No results for input(s): CKTOTAL, CKMB, CKMBINDEX, TROPONINI in the last 168 hours. BNP (last 3 results) No results for input(s): PROBNP in the last 8760 hours. HbA1C: No results for input(s): HGBA1C in the last 72 hours. CBG: Recent Labs  Lab 09/11/20 0737 09/11/20 1337 09/11/20 1734 09/11/20 2247 09/12/20 0751  GLUCAP 157* 133* 109* 132* 170*   Lipid Profile: No results for input(s): CHOL, HDL, LDLCALC, TRIG, CHOLHDL, LDLDIRECT in the last 72 hours. Thyroid Function Tests: No results for input(s): TSH, T4TOTAL, FREET4, T3FREE, THYROIDAB in the last 72 hours. Anemia Panel: No results for input(s): VITAMINB12, FOLATE, FERRITIN, TIBC, IRON, RETICCTPCT in the last 72 hours. Sepsis Labs: Recent Labs  Lab 09/08/20 1524 09/08/20 1727  PROCALCITON 3.48  --    LATICACIDVEN 1.6 1.5    Recent Results (from the past 240 hour(s))  Culture, blood (x 2)     Status: None (Preliminary result)   Collection Time: 09/08/20  3:02 PM   Specimen: BLOOD  Result Value Ref Range Status   Specimen Description   Final    BLOOD RIGHT ANTECUBITAL Performed at [facility] [address], [place], [place] [address]    Special Requests   Final    BOTTLES DRAWN AEROBIC AND ANAEROBIC Blood Culture adequate volume Performed at [facility] [address], [place], [place] [address]    Culture   Final    NO GROWTH 4 DAYS Performed at [facility], [place] [address] [place], [place] [address]    Report Status PENDING  Incomplete  Culture, blood (x 2)     Status: Abnormal   Collection Time: 09/08/20  3:03 PM   Specimen: BLOOD  Result Value Ref Range Status   Specimen Description   Final    BLOOD LEFT ANTECUBITAL Performed at [facility] [address] [place],  [address]    Special Requests   Final    BOTTLES DRAWN AEROBIC AND ANAEROBIC Blood Culture adequate volume Performed at [facility] [address] [place], [place] [address]    Culture  Setup Time   Final    GRAM NEGATIVE RODS AEROBIC BOTTLE ONLY CRITICAL RESULT CALLED TO, READ BACK BY AND VERIFIED WITH: A. PHAM PHARMD, AT 1247 09/09/20 BY Rush Landmark Performed at [facility], [place] [address] [place], [place] [address]    Culture PSEUDOMONAS AERUGINOSA (A)  Final   Report Status 09/11/2020 FINAL  Final   Organism ID, Bacteria PSEUDOMONAS AERUGINOSA  Final      Susceptibility   Pseudomonas aeruginosa - MIC*    CEFTAZIDIME 4 SENSITIVE Sensitive     CIPROFLOXACIN <=0.25 SENSITIVE Sensitive     GENTAMICIN 8 INTERMEDIATE Intermediate     IMIPENEM 2 SENSITIVE Sensitive     PIP/TAZO 16 SENSITIVE Sensitive     * PSEUDOMONAS AERUGINOSA  Blood Culture ID Panel (Reflexed)     Status: Abnormal    Collection Time: 09/08/20  3:03 PM  Result Value Ref Range Status  Enterococcus faecalis NOT DETECTED NOT DETECTED Final   Enterococcus Faecium NOT DETECTED NOT DETECTED Final   Listeria monocytogenes NOT DETECTED NOT DETECTED Final   Staphylococcus species NOT DETECTED NOT DETECTED Final   Staphylococcus aureus (BCID) NOT DETECTED NOT DETECTED Final   Staphylococcus epidermidis NOT DETECTED NOT DETECTED Final   Staphylococcus lugdunensis NOT DETECTED NOT DETECTED Final   Streptococcus species NOT DETECTED NOT DETECTED Final   Streptococcus agalactiae NOT DETECTED NOT DETECTED Final   Streptococcus pneumoniae NOT DETECTED NOT DETECTED Final   Streptococcus pyogenes NOT DETECTED NOT DETECTED Final   A.calcoaceticus-baumannii NOT DETECTED NOT DETECTED Final   Bacteroides fragilis NOT DETECTED NOT DETECTED Final   Enterobacterales NOT DETECTED NOT DETECTED Final   Enterobacter cloacae complex NOT DETECTED NOT DETECTED Final   Escherichia coli NOT DETECTED NOT DETECTED Final   Klebsiella aerogenes NOT DETECTED NOT DETECTED Final   Klebsiella oxytoca NOT DETECTED NOT DETECTED Final   Klebsiella pneumoniae NOT DETECTED NOT DETECTED Final   Proteus species NOT DETECTED NOT DETECTED Final   Salmonella species NOT DETECTED NOT DETECTED Final   Serratia marcescens NOT DETECTED NOT DETECTED Final   Haemophilus influenzae NOT DETECTED NOT DETECTED Final   Neisseria meningitidis NOT DETECTED NOT DETECTED Final   Pseudomonas aeruginosa DETECTED (A) NOT DETECTED Final    Comment: CRITICAL RESULT CALLED TO, READ BACK BY AND VERIFIED WITH: A. PHAM PHARMD, AT 1247 09/09/20 BY D. VANHOOK    Stenotrophomonas maltophilia NOT DETECTED NOT DETECTED Final   Candida albicans NOT DETECTED NOT DETECTED Final   Candida auris NOT DETECTED NOT DETECTED Final   Candida glabrata NOT DETECTED NOT DETECTED Final   Candida krusei NOT DETECTED NOT DETECTED Final   Candida parapsilosis NOT DETECTED NOT DETECTED Final    Candida tropicalis NOT DETECTED NOT DETECTED Final   Cryptococcus neoformans/gattii NOT DETECTED NOT DETECTED Final   CTX-M ESBL NOT DETECTED NOT DETECTED Final   Carbapenem resistance IMP NOT DETECTED NOT DETECTED Final   Carbapenem resistance KPC NOT DETECTED NOT DETECTED Final   Carbapenem resistance NDM NOT DETECTED NOT DETECTED Final   Carbapenem resistance VIM NOT DETECTED NOT DETECTED Final    Comment: Performed at [facility] [address] [place], [place] [address]  Urine culture     Status: Abnormal   Collection Time: 09/08/20  7:39 PM   Specimen: Urine, Catheterized  Result Value Ref Range Status   Specimen Description   Final    URINE, CATHETERIZED Performed at [facility] [address] [place], [place] [address]    Special Requests   Final    NONE Performed at [facility], [place] [address] [place], [place] [address]    Culture MULTIPLE SPECIES PRESENT, SUGGEST RECOLLECTION (A)  Final   Report Status 09/10/2020 FINAL  Final         Radiology Studies: DG Chest 1 View  Result Date: 09/11/2020 CLINICAL DATA:  Pneumothorax, pneumonia. EXAM: CHEST  1 VIEW COMPARISON:  Same day. FINDINGS: Stable cardiomegaly. Right-sided chest tube is again noted. Mild right apical pneumothorax is noted which is decreased compared to prior exam. Stable bilateral lung opacities are noted concerning for pneumonia. Probable small left pleural effusion is noted. Bony thorax is unremarkable. IMPRESSION: Mild right apical pneumothorax is noted which is decreased compared to prior exam. Stable bilateral lung opacities concerning for pneumonia. Probable small left pleural effusion. Electronically Signed   By: Marijo Conception M.D.   On: 09/11/2020 13:30  DG Chest Port 1 View  Result Date: 09/12/2020 CLINICAL DATA:  Respiratory failure.  Chest tube. EXAM: PORTABLE CHEST 1 VIEW COMPARISON:  09/11/2020. FINDINGS: Right chest tube in stable position.  Interim improvement of right apical pneumothorax with mild residual. Bilateral multifocal pulmonary infiltrates again noted without interim change. Bibasilar atelectasis. Small right pleural effusion today's exam. Heart size normal. IMPRESSION: 1. Right chest tube in stable position. Interim improvement of right apical pneumothorax with mild residual. 2. Bilateral multifocal pulmonary infiltrates again noted without interim change. Bibasilar atelectasis. Small right pleural effusion noted on today's exam. Electronically Signed   By: Marcello [place]  Register   On: 09/12/2020 05:35   DG Chest Port 1 View  Result Date: 09/11/2020 CLINICAL DATA:  Follow-up right pneumothorax EXAM: PORTABLE CHEST 1 VIEW COMPARISON:  09/10/2020 FINDINGS: Cardiac enlargement. Right chest tube remains in place. Persistent right pneumothorax appears increased since size since previous study with persistent collapse and consolidation of the right lung. Left perihilar infiltration. Small left pleural effusion. Calcification of the aorta. IMPRESSION: Persistent right pneumothorax, increasing since previous study with persistent collapse and consolidation of the right lung. Right chest tube remains in place. Cardiac enlargement. Small left pleural effusion. Electronically Signed   By: Lucienne Capers M.D.   On: 09/11/2020 05:38   DG CHEST PORT 1 VIEW  Result Date: 09/10/2020 CLINICAL DATA:  Pneumothorax. EXAM: PORTABLE CHEST 1 VIEW COMPARISON:  Radiograph earlier today. FINDINGS: Pigtail catheter in the right hemithorax with tip in the mid upper lung zone. Slight increased size in the right pneumothorax from prior exam visualized the apex and laterally. Lucency about the right mediastinum is likely medial component. Pneumothorax appears moderate in size. No significant change in the multifocal airspace opacities in the right hemithorax. Stable heart size and mediastinal contours. Volume loss at the left lung base with hazy opacity likely  pleural effusion. Streaky left suprahilar opacities are stable. There is no left pleural pneumothorax. IMPRESSION: 1. Slight increased size of right pneumothorax from prior exam, now moderate in size. Right pigtail catheter remains in place. 2. Unchanged multifocal airspace opacities in the right hemithorax. 3. Stable hazy opacity at the left lung base and suprahilar opacities. These results will be called to the ordering clinician or representative by the Radiologist Assistant, and communication documented in the PACS or [employer]. Electronically Signed   By: Keith Rake M.D.   On: 09/10/2020 17:35        Scheduled Meds: . insulin aspart  0-9 Units Subcutaneous TID WC  . mouth rinse  15 mL Mouth Rinse BID  . mirtazapine  15 mg Oral QHS  . multivitamin with minerals  1 tablet Oral Daily  . pantoprazole  40 mg Oral Daily  . scopolamine  1 patch Transdermal Q72H  . sodium chloride flush  10 mL Intracatheter Q8H  . Tbo-filgrastim (GRANIX) injection [facility]  300 mcg Subcutaneous Daily   Continuous Infusions: . sodium chloride 10 mL/hr at 09/11/20 0600  . ceFEPime (MAXIPIME) IV 2 g (09/12/20 2202)     LOS: 16 days     Georgette Shell, MD  09/12/2020, 4:16 PM

## 2020-09-12 NOTE — Progress Notes (Addendum)
DNR bracelet placed on patient, family at bedside. Wife daughter, sister. Wife request that pt remain on Bi-pap for now; will discuss when nephew arrive and they all sit and have a family discussion.  Will cont to monitor and check in with pt and family. SRP, RN

## 2020-09-12 NOTE — Progress Notes (Signed)
Called to see patient at bedside  Shallow breathing, exhausted  I did discuss with spouse at bedside-CODE STATUS, resuscitation, intubation  I do not believe it is in the best interest of the patient to have him on a ventilator, he will not survive the process.  Very frail, very deconditioned, failure to thrive.  Made the patient a DNR following discussions with his spouse  She is awaiting his daughter to visit as well.  We will try him on BiPAP to see if this helps and gets him more comfortable

## 2020-09-12 NOTE — Progress Notes (Signed)
Bipap removed. Pt placed on 15L HFNC. Pt is coughing. RN notified.

## 2020-09-12 NOTE — Progress Notes (Signed)
Daily Progress Note   Patient Name: Tony Marshall       Date: 09/12/2020 DOB: 1941/11/06  Age: 79 y.o. MRN#: [id number] Attending Physician: Georgette Shell, MD Primary Care Physician: Timoteo Gaul, FNP Admit Date: 08/23/2020  Reason for Consultation/Follow-up: Establishing goals of care  Subjective:   Mr. Kader is awake, has some cough, appears weak, wife at bedside states that patient rested well overnight. PCCM following, discussed with wife about results from today's chest X ray.  Length of Stay: 16  Current Medications: Scheduled Meds:  . insulin aspart  0-9 Units Subcutaneous TID WC  . mouth rinse  15 mL Mouth Rinse BID  . mirtazapine  15 mg Oral QHS  . multivitamin with minerals  1 tablet Oral Daily  . pantoprazole  40 mg Oral Daily  . sodium chloride flush  10 mL Intracatheter Q8H  . sodium chloride flush  10 mL Intracatheter Q8H  . sodium chloride flush  10-40 mL Intracatheter Q12H  . Tbo-filgrastim (GRANIX) injection [facility]  300 mcg Subcutaneous Daily    Continuous Infusions: . sodium chloride 10 mL/hr at 09/11/20 0600  . ceFEPime (MAXIPIME) IV 2 g (09/12/20 4098)    PRN Meds: sodium chloride, acetaminophen (TYLENOL) oral liquid 160 mg/5 mL, ALPRAZolam, benzonatate, guaiFENesin-dextromethorphan, hydrALAZINE, lip balm, sodium chloride flush  Physical Exam         General: awake coughing at times   Weak and frail. HEENT: No bruits, no goiter, no JVD Heart: Regular rate and rhythm. No murmur appreciated. Lungs: Diminished air movement, crackles in base on L Abdomen: Soft, nontender, nondistended, positive bowel sounds.  Ext: +edema Skin: Warm and dry Neuro: Grossly intact, nonfocal.   Vital Signs: BP (!) 143/70 (BP Location: Left Arm)   Pulse 94    Temp 98.1 F (36.7 C) (Axillary)   Resp 20   Ht 5' 7.5" (1.715 m)   Wt 55 kg   SpO2 98%   BMI 18.71 kg/m  SpO2: SpO2: 98 % O2 Device: O2 Device: Nasal Cannula O2 Flow Rate: O2 Flow Rate (L/min): 3 L/min  Intake/output summary:   Intake/Output Summary (Last 24 hours) at 09/12/2020 1021 Last data filed at 09/12/2020 0600 Gross per 24 hour  Intake 1446.05 ml  Output 250 ml  Net 1196.05 ml   LBM: Last BM Date:  09/11/20 Baseline Weight: Weight: 52.3 kg Most recent weight: Weight: 55 kg       Palliative Assessment/Data:    Flowsheet Rows     Most Recent Value  Intake Tab  Referral Department Hospitalist  Unit at Time of Referral Med/Surg Unit  Palliative Care Primary Diagnosis Other (Comment)  [FTT]  Date Notified 09/03/20  Palliative Care Type Return patient Palliative Care  Reason for referral Clarify Goals of Care  Date of Admission 09/05/2020  Date first seen by Palliative Care 09/04/20  # of days Palliative referral response time 1 Day(s)  # of days IP prior to Palliative referral 8  Clinical Assessment  Psychosocial & Spiritual Assessment  Palliative Care Outcomes      Patient Active Problem List   Diagnosis Date Noted  . Pneumothorax on right   . Pneumonia due to infectious organism   . Severe protein-calorie malnutrition ([place])   . Hypokalemia   . Hypomagnesemia   . Hypophosphatemia   . Acute metabolic encephalopathy   . Pressure injury of skin 08/30/2020  . Septic shock ([place]) 08/27/2020  . Iatrogenic pneumothorax on R  08/27/2020  . AKI (acute kidney injury) ([place]) 08/27/2020  . Hypotension due to hypovolemia   . Advanced care planning/counseling discussion   . Palliative care by specialist   . Cough   . DNR (do not resuscitate)   . Physical deconditioning 07/19/2020  . Stercoral ulcer of rectum   . Prolonged QT interval 07/03/2020  . Atrial fibrillation ([place])   . Shock circulatory ([place]) 07/01/2020  . Lower GI bleed   . Central venous catheter  in place   . Fecal impaction ([place])   . Overflow diarrhea   . Rectal bleeding   . DKA (diabetic ketoacidoses) ([place]) 06/28/2020  . Systolic and diastolic CHF, chronic ([place]) 06/28/2020  . Anemia associated with acute blood loss   . Dehydration 06/27/2020  . Protein-calorie malnutrition, severe ([place]) 06/09/2020  . Hyperglycemia due to diabetes mellitus ([place])   . Other pancytopenia ([place])   . Acute combined systolic and diastolic heart failure ([place])   . Cardiogenic shock ([place])   . Altered mental status   . Failure to thrive in adult   . Symptomatic anemia   . Acute respiratory failure ([place])   . Increased anion gap metabolic acidosis 67/61/9509  . Goals of care, counseling/discussion 03/28/2020  . Encounter for antineoplastic chemotherapy 03/28/2020  . GERD (gastroesophageal reflux disease) 05/13/2014  . History of colonic polyps 05/13/2014  . Hiatal hernia 05/13/2014  . CLL (chronic lymphocytic leukemia) ([place]) 10/25/2013  . ED (erectile dysfunction) 10/20/2013  . Lymphocytosis 09/23/2013  . Essential hypertension 07/21/2013  . Pure hypercholesterolemia 07/21/2013  . Diabetes mellitus ([place]) 07/20/2013    Palliative Care Assessment & Plan  Recommendations/Plan:  Plan to continue to treat his treatable conditions this admission.  Family would like to continue treatment for current infection and medically maximize him prior to discharge.  PCCM following, R chest tube in stable position and improvement in R pneumothorax, how ever still with bilateral multi factorial pulmonary infiltrates on CXR, results shared with wife at bedside.   Recommend home with hospice on discharge. Appreciate [facility] hospice following inpatient.     Code Status:    Code Status Orders  (From admission, onward)         Start     Ordered   08/27/20 0059  Limited resuscitation (code)  Continuous       Question Answer Comment  In the event  of cardiac or respiratory ARREST: Initiate Code Blue, Call Rapid  Response Yes   In the event of cardiac or respiratory ARREST: Perform CPR No   In the event of cardiac or respiratory ARREST: Perform Intubation/Mechanical Ventilation Yes   In the event of cardiac or respiratory ARREST: Use NIPPV/BiPAp only if indicated Yes   In the event of cardiac or respiratory ARREST: Administer ACLS medications if indicated No   In the event of cardiac or respiratory ARREST: Perform Defibrillation or Cardioversion if indicated No      08/27/20 0059        Code Status History    Date Active Date Inactive Code Status Order ID Comments User Context   07/20/2020 1532 07/21/2020 2120 DNR [id number]  Earlie Counts, NP Inpatient   07/19/2020 1949 07/20/2020 1532 Full Code [id number]  Clarnce Flock, MD ED   06/28/2020 1122 07/11/2020 1752 Full Code [id number]  Donne Hazel, MD ED   06/05/2020 1839 06/21/2020 0024 Full Code [id number]  Cherene Altes, MD ED   Advance Care Planning Activity       Prognosis:  Guarded  Discharge Planning:  Home with Hospice once medically ready  Care plan was discussed with patient, wife  Thank you for allowing the Palliative Medicine Team to assist in the care of this patient.   Time In: 9 Time Out: 9.25 Total Time 25 Prolonged Time Billed  No   Greater than 50%  of this time was spent counseling and coordinating care related to the above assessment and plan.   Loistine Chance, MD  Please contact Palliative Medicine Team phone at [phone] for questions and concerns.

## 2020-09-12 NOTE — Progress Notes (Signed)
PT Cancellation Note  Patient Details Name: Tony Marshall MRN: [id number] DOB: 1941/09/22   Cancelled Treatment:    Reason Eval/Treat Not Completed: Medical issues which prohibited therapy (noted medical decline & pt is now comfort care. Will hold PT.)  Philomena Doheny PT 09/12/2020  Acute Rehabilitation Services Pager [phone] Office [phone]

## 2020-09-12 NOTE — Progress Notes (Addendum)
Pt open eyes to touch, does not appear to track movement. Does NOT respond to conversations or questions. Resting with eyes semi closed, artificial tears ordered to maintain moisture. Tachypnea, respiration 30's, BP fluates. Bil arm weeping. Changed top linen and gentle mouth care. Family remains @ bedside with pt. Will cont to monitor.SRP, RN

## 2020-09-12 NOTE — Progress Notes (Addendum)
Called to by room by family, request to discontinue Bi-PAP and speak with MD, will make MD aware and update RT. SRP, RN

## 2020-09-12 NOTE — Progress Notes (Signed)
Routine rounding at 10:10 am checking with pt and wife. Pt with decreased breathe sounds, VS noted on monitor. O2 on 3 liters 91%, addressed questions with spouse concerning pt, gentle mouth care. At 10:40 am ambulating pass room, notice O2 decreased to 86% on 3 liters, increased O2 to 5 liters not change in oxygen sat, O2 sat continue to decline, CN made aware, pt place on Non-re breather, Rapid Response RN called and Dr. Rollene Fare up to assess pt , Dr. Rodena Piety present at bedside to re-assess pt status. Pt increased lethargy noted and less responsive. Open eyes slightly to name call. Respiratory called to start Bi-PAP as pt is LIMITED CODE STATUS. Spouse remain with pt and daughter notified. Pt currently on BI-Pap and awaits for daughter arrival. Will continue to monitor.  SRP, RN

## 2020-09-13 ENCOUNTER — Ambulatory Visit: Payer: Medicare HMO | Admitting: General Practice

## 2020-09-13 DIAGNOSIS — R579 Shock, unspecified: Secondary | ICD-10-CM

## 2020-09-13 LAB — CULTURE, BLOOD (ROUTINE X 2)
Culture: NO GROWTH
Special Requests: ADEQUATE

## 2020-09-13 LAB — MAGNESIUM: Magnesium: 2 mg/dL (ref 1.7–2.4)

## 2020-09-13 NOTE — Progress Notes (Signed)
Nutrition Brief Note  Chart reviewed. Patient was seen for full assessment by another RD on 9/24.  Patient now transitioning to comfort care.   No further nutrition interventions warranted at this time. Please re-consult as needed.       Jarome Matin, MS, RD, LDN, CNSC Inpatient Clinical Dietitian RD pager # available in [place]  After hours/weekend pager # available in [facility]

## 2020-09-13 NOTE — Progress Notes (Signed)
OT Cancellation Note  Patient Details Name: Tony Marshall MRN: [id number] DOB: Sep 20, 1941   Cancelled Treatment:    Reason Eval/Treat Not Completed: Other (comment) Per chart review comfort care has been initiated with patient. Acute OT services will sign off at this time.   Delbert Phenix OT OT pager: [place] 09/13/2020, 6:44 AM

## 2020-09-13 NOTE — Progress Notes (Addendum)
Pt chest tube to waterseal, suction discontinued. O2 on 10 liters HFNC 100%. SRP, RN

## 2020-09-13 NOTE — Progress Notes (Signed)
I did check a from patient today He is resting comfortably  Chest tube in place draining clear serous fluid No airleak in chest tube  Family member at bedside -States patient appears comfortable  Patient is comfort measures only DNR  I think it is okay to leave chest tube in place at present time not change anything, will leave chest tube to waterseal, take off suction

## 2020-09-13 NOTE — Progress Notes (Addendum)
D/C tele monitor, decreased O2 to 8 liters HFNC sat 99% will cont to spot check. SRP, RN

## 2020-09-13 NOTE — Progress Notes (Addendum)
Pt remain unchanged. O2 noted and unchanged.  Wife and daughter, sister n law at bedside at bedside. RN

## 2020-09-13 NOTE — Progress Notes (Addendum)
Daily Progress Note   Patient Name: Tony Marshall       Date: 09/13/2020 DOB: 30-Dec-1940  Age: 79 y.o. MRN#: [id number] Attending Physician: Patrecia Pour, MD Primary Care Physician: Timoteo Gaul, FNP Admit Date: 08/27/2020  Reason for Consultation/Follow-up: Establishing goals of care  Subjective:   Tony Marshall is resting comfortably at the moment, wife also resting at bedside, appears comfortable, did not awaken. Daughter just left, spoke with daughter and other family members yesterday evening after comfort care choice was made. Discussed about prn opioids and symptom management at end of life. No urine output.    Length of Stay: 17  Current Medications: Scheduled Meds:   mouth rinse  15 mL Mouth Rinse BID   scopolamine  1 patch Transdermal Q72H   sodium chloride flush  10 mL Intracatheter Q8H    Continuous Infusions:  sodium chloride 10 mL/hr at 09/11/20 0600   ceFEPime (MAXIPIME) IV 2 g (09/13/20 0537)    PRN Meds: sodium chloride, acetaminophen **OR** acetaminophen, ALPRAZolam, antiseptic oral rinse, atropine, benzonatate, guaiFENesin-dextromethorphan, haloperidol **OR** haloperidol **OR** haloperidol lactate, HYDROmorphone (DILAUDID) injection, lip balm, ondansetron **OR** ondansetron (ZOFRAN) IV, polyvinyl alcohol  Physical Exam         General: not awake resting, no non verbal gestures of distress evident.    Weak and frail. HEENT: No bruits, no goiter, no JVD Heart: Regular rate and rhythm. No murmur appreciated. Lungs: Diminished air movement, crackles in base on L Abdomen: Soft, nontender, nondistended, positive bowel sounds.  Ext: +edema Skin: Warm and dry Neuro: Grossly intact, nonfocal.   Vital Signs: BP (!) 91/54    Pulse 98    Temp 98.1 F  (36.7 C) (Axillary)    Resp (!) 22    Ht 5' 7.5" (1.715 m)    Wt 55 kg    SpO2 100%    BMI 18.71 kg/m  SpO2: SpO2: 100 % O2 Device: O2 Device: High Flow Nasal Cannula O2 Flow Rate: O2 Flow Rate (L/min): 10 L/min  Intake/output summary:   Intake/Output Summary (Last 24 hours) at 09/13/2020 0913 Last data filed at 09/13/2020 0500 Gross per 24 hour  Intake 100 ml  Output 200 ml  Net -100 ml   LBM: Last BM Date: 09/11/20 Baseline Weight: Weight: 52.3 kg Most recent weight: Weight: 55 kg  Palliative Assessment/Data:    Flowsheet Rows     Most Recent Value  Intake Tab  Referral Department Hospitalist  Unit at Time of Referral Med/Surg Unit  Palliative Care Primary Diagnosis Other (Comment)  [FTT]  Date Notified 09/03/20  Palliative Care Type Return patient Palliative Care  Reason for referral Clarify Goals of Care  Date of Admission 09/05/2020  Date first seen by Palliative Care 09/04/20  # of days Palliative referral response time 1 Day(s)  # of days IP prior to Palliative referral 8  Clinical Assessment  Psychosocial & Spiritual Assessment  Palliative Care Outcomes      Patient Active Problem List   Diagnosis Date Noted   Acute pneumothorax    Central line complication    Pneumothorax on right    HCAP (healthcare-associated pneumonia)    Severe protein-calorie malnutrition ([place])    Hypokalemia    Hypomagnesemia    Hypophosphatemia    Acute metabolic encephalopathy    Pressure injury of skin 08/30/2020   Septic shock ([place]) 08/27/2020   Iatrogenic pneumothorax on R  08/27/2020   AKI (acute kidney injury) ([place]) 08/27/2020   Hypotension due to hypovolemia    Advanced care planning/counseling discussion    Palliative care by specialist    Cough    DNR (do not resuscitate)    Physical deconditioning 07/19/2020   Stercoral ulcer of rectum    Prolonged QT interval 07/03/2020   Atrial fibrillation ([place])    Shock circulatory ([place])  07/01/2020   Lower GI bleed    Central venous catheter in place    Fecal impaction ([facility])    Overflow diarrhea    Rectal bleeding    DKA (diabetic ketoacidoses) ([place]) 74/11/8785   Systolic and diastolic CHF, chronic ([place]) 06/28/2020   Anemia associated with acute blood loss    Dehydration 06/27/2020   Protein-calorie malnutrition, severe ([place]) 06/09/2020   Hyperglycemia due to diabetes mellitus ([place])    Other pancytopenia ([place])    Acute combined systolic and diastolic heart failure ([facility])    Cardiogenic shock ([facility])    Altered mental status    Failure to thrive in adult    Symptomatic anemia    Acute respiratory failure ([facility])    Increased anion gap metabolic acidosis 76/72/0947   Goals of care, counseling/discussion 03/28/2020   Encounter for antineoplastic chemotherapy 03/28/2020   GERD (gastroesophageal reflux disease) 05/13/2014   History of colonic polyps 05/13/2014   Hiatal hernia 05/13/2014   CLL (chronic lymphocytic leukemia) ([place]) 10/25/2013   ED (erectile dysfunction) 10/20/2013   Lymphocytosis 09/23/2013   Essential hypertension 07/21/2013   Pure hypercholesterolemia 07/21/2013   Diabetes mellitus ([place]) 07/20/2013    Palliative Care Assessment & Plan  Recommendations/Plan:  Comfort care  Possible hospital death  Probable prognosis hours to some very limited number of days  Appreciate bedside RN, continue current mode of care.   Attempt to wean O2, use prn opioids for symptoms.      Code Status:    Code Status Orders  (From admission, onward)         Start     Ordered   08/27/20 0059  Limited resuscitation (code)  Continuous       Question Answer Comment  In the event of cardiac or respiratory ARREST: Initiate Code Blue, Call Rapid Response Yes   In the event of cardiac or respiratory ARREST: Perform CPR No   In the event of cardiac or respiratory ARREST: Perform Intubation/Mechanical Ventilation Yes   In  the event of  cardiac or respiratory ARREST: Use NIPPV/BiPAp only if indicated Yes   In the event of cardiac or respiratory ARREST: Administer ACLS medications if indicated No   In the event of cardiac or respiratory ARREST: Perform Defibrillation or Cardioversion if indicated No      08/27/20 0059        Code Status History    Date Active Date Inactive Code Status Order ID Comments User Context   07/20/2020 1532 07/21/2020 2120 DNR [id number]  Earlie Counts, NP Inpatient   07/19/2020 1949 07/20/2020 1532 Full Code [id number]  Clarnce Flock, MD ED   06/28/2020 1122 07/11/2020 1752 Full Code [id number]  Donne Hazel, MD ED   06/05/2020 1839 06/21/2020 0024 Full Code [id number]  Cherene Altes, MD ED   Advance Care Planning Activity       Prognosis:  Hours to days.   Discharge Planning: Anticipated Hospital Death   Care plan was discussed with IDT Thank you for allowing the Palliative Medicine Team to assist in the care of this patient.   Time In: 9 Time Out: 9.15 Total Time 15 Prolonged Time Billed  No   Greater than 50%  of this time was spent counseling and coordinating care related to the above assessment and plan.   Loistine Chance, MD  Please contact Palliative Medicine Team phone at [phone] for questions and concerns.

## 2020-09-13 NOTE — Progress Notes (Signed)
Pt Primofit changed with NT assistance, scant amount of urine noted dark amber, bladder scanned 0 noted and rechecked 0 ml noted. Will continue to monitor. Attempt to wean pt to 6 liters unable to tolerated, begin to cough uncontrollable, family anxious and to resume 8 liters. Pt tolerates 8 liters of HRNC. SRP, RN

## 2020-09-13 NOTE — Progress Notes (Signed)
PROGRESS NOTE  Tony Marshall  ZHG:[id number] DOB: 1941/07/27 DOA: 09/10/2020 PCP: Timoteo Gaul, FNP   Brief Narrative: 79 year old male with PMH of chronic systolic CHF, CLL on chemotherapy presented with shock and pneumonia. He developed large right-sided iatrogenic pneumothorax, now status post chest tube and PCCM continue to follow. Hospital course complicated by ongoing poor oral intake, failure to thrive, persisting right pneumothorax, new sepsis 9/24 secondary to Pseudomonas bacteremia from likely aspiration pneumonia. Due to persistent clinical decline despite maximal medical therapy, palliative care discussions were continued with the recommendation by the medical team to convert to comfort measures based on medical futility. The palliative care medicine team has been helpful in supporting this transition. Hospital death is anticipated.   Assessment & Plan: Active Problems:   CLL (chronic lymphocytic leukemia) (HCC)   Acute respiratory failure (HCC)   Other pancytopenia (HCC)   Acute combined systolic and diastolic heart failure (HCC)   Shock circulatory (HCC)   Septic shock (HCC)   Iatrogenic pneumothorax on R    AKI (acute kidney injury) ([place])   Pressure injury of skin   Pneumothorax on right   HCAP (healthcare-associated pneumonia)   Severe protein-calorie malnutrition (HCC)   Hypokalemia   Hypomagnesemia   Hypophosphatemia   Acute metabolic encephalopathy   Acute pneumothorax   Central line complication  Acute hypoxic respiratory failure secondary to iatrogenic pneumothorax pneumonia and severe deconditioning:  - Chest tube to water seal per PCCM. - DNR, DNI - Appreciate chaplain support, palliative care support.   Sepsis with febrile neutropenia and Pseudomonas bacteremia and recurrent aspiration pneumonia not POA. Treated with antibiotics without significant clinical improvement.   Septic shock secondary to pneumonia present on admission  resolved  AKI  T2DM  Multiple electrolyte abnormalities  Acute metabolic encephalopathy  CLL  Pancytopenia  Goals of care: Converted to comfort as primary goal of care on 9/28.  - Appreciate holistic care on [place] and palliative care team involvement.  - DC telemetry and continuous pulse oximetry monitoring as this will not alter management.   Failure to thrive/severe malnutrition fat depletion muscle depletion  RN Pressure Injury Documentation: Pressure Injury 08/27/20 Sacrum Medial Stage 2 -  Partial thickness loss of dermis presenting as a shallow open injury with a red, pink wound bed without slough. Red area with small area of broken skin in center (Active)  08/27/20 1024  Location: Sacrum  Location Orientation: Medial  Staging: Stage 2 -  Partial thickness loss of dermis presenting as a shallow open injury with a red, pink wound bed without slough.  Wound Description (Comments): Red area with small area of broken skin in center  Present on Admission: Yes     Pressure Injury 08/27/20 Nose Left Deep Tissue Pressure Injury - Purple or maroon localized area of discolored intact skin or blood-filled blister due to damage of underlying soft tissue from pressure and/or shear. (Active)  08/27/20 1024  Location: Nose  Location Orientation: Left  Staging: Deep Tissue Pressure Injury - Purple or maroon localized area of discolored intact skin or blood-filled blister due to damage of underlying soft tissue from pressure and/or shear.  Wound Description (Comments):   Present on Admission: Yes    Nutrition Problem: Severe Malnutrition Etiology: chronic illness, cancer and cancer related treatments  Code Status: DNR Family Communication: Wife and SIL at bedside Disposition Plan:  Status is: Inpatient  Remains inpatient appropriate because:Inpatient level of care appropriate due to severity of illness   Dispo: Anticipate hospital death  in [place]. Not currently  stable for transfer to hospice at home or residential hospice.   Consultants:   Palliative care  PCCM  Procedures:   Chest tube insertion  Antimicrobials:  Cefepime, vancomycin, zosyn  Subjective: Minimally responsive. Wife reports improved restfulness.   Objective: Vitals:   09/13/20 0400 09/13/20 0500 09/13/20 0700 09/13/20 1319  BP:      Pulse: 100 97 98 (!) 105  Resp: (!) 23 (!) 21 (!) 22   Temp:      TempSrc:      SpO2: 98% 99% 100% 98%  Weight:      Height:        Intake/Output Summary (Last 24 hours) at 09/13/2020 1733 Last data filed at 09/13/2020 0500 Gross per 24 hour  Intake --  Output 200 ml  Net -200 ml   Filed Weights   09/10/20 0339 09/11/20 0500 09/12/20 0417  Weight: 57 kg 58.1 kg 55 kg    Gen: Acutely and chronically ill-appearing, frail male in no acute distress Pulm: Non-labored, rate in 20's, coarse.  CV: Regular rate and rhythm. No murmur, rub, or gallop. No JVD. GI: Abdomen soft, non-tender, non-distended, +BS.  Ext: Warm, edematous diffusely Skin: Weeping, no new lesions on visualized skin Neuro: Not cooperative with exam, intermittent myoclonus.  Psych: Calm.  Data Reviewed: I have personally reviewed following labs and imaging studies  CBC: Recent Labs  Lab 09/08/20 0543 09/09/20 0421 09/10/20 0455 09/11/20 0436 09/12/20 0537  WBC 0.4* 0.7* 0.5* 0.7* 1.3*  NEUTROABS 0.1* 0.1* 0.1* 0.2* 0.8*  HGB 8.4* 9.2* 8.9* 8.6* 8.8*  HCT 26.3* 29.1* 27.9* 27.5* 29.2*  MCV 88.0 89.0 87.2 88.7 93.6  PLT 82* 128* 106* 89* 67*   Basic Metabolic Panel: Recent Labs  Lab 09/07/20 0747 09/07/20 0747 09/08/20 0543 09/08/20 0543 09/09/20 0421 09/10/20 0455 09/11/20 0436 09/12/20 0537 09/13/20 0407  NA 136   < > 135  --  134* 138 139 140  --   K 3.0*   < > 3.2*  --  4.4 4.4 4.1 3.7  --   CL 92*   < > 93*  --  94* 100 104 107  --   CO2 33*   < > 30  --  26 24 22  14*  --   GLUCOSE 125*   < > 99  --  160* 137* 165* 182*  --   BUN 20    < > 20  --  29* 41* 49* 53*  --   CREATININE 0.64   < > 0.58*  --  0.89 1.33* 1.52* 1.64*  --   CALCIUM 8.2*   < > 7.7*  --  8.1* 8.0* 8.0* 8.2*  --   MG 1.7   < > 1.6*   < > 2.3 2.3 2.2 2.2 2.0  PHOS 1.8*  --  3.0  --  3.6  --   --   --   --    < > = values in this interval not displayed.   GFR: Estimated Creatinine Clearance: 29.8 mL/min (A) (by C-G formula based on SCr of 1.64 mg/dL (H)). Liver Function Tests: Recent Labs  Lab 09/07/20 0747 09/08/20 0543 09/09/20 0421  ALBUMIN 2.2* 2.1* 2.3*   No results for input(s): LIPASE, AMYLASE in the last 168 hours. No results for input(s): AMMONIA in the last 168 hours. Coagulation Profile: No results for input(s): INR, PROTIME in the last 168 hours. Cardiac Enzymes: No results for input(s): CKTOTAL, CKMB, CKMBINDEX,  TROPONINI in the last 168 hours. BNP (last 3 results) No results for input(s): PROBNP in the last 8760 hours. HbA1C: No results for input(s): HGBA1C in the last 72 hours. CBG: Recent Labs  Lab 09/11/20 1337 09/11/20 1734 09/11/20 2247 09/12/20 0751 09/12/20 1802  GLUCAP 133* 109* 132* 170* 168*   Lipid Profile: No results for input(s): CHOL, HDL, LDLCALC, TRIG, CHOLHDL, LDLDIRECT in the last 72 hours. Thyroid Function Tests: No results for input(s): TSH, T4TOTAL, FREET4, T3FREE, THYROIDAB in the last 72 hours. Anemia Panel: No results for input(s): VITAMINB12, FOLATE, FERRITIN, TIBC, IRON, RETICCTPCT in the last 72 hours. Urine analysis:    Component Value Date/Time   COLORURINE AMBER (A) 09/08/2020 1939   APPEARANCEUR HAZY (A) 09/08/2020 1939   LABSPEC 1.023 09/08/2020 1939   PHURINE 5.0 09/08/2020 1939   GLUCOSEU 150 (A) 09/08/2020 1939   GLUCOSEU 500 (A) 06/27/2020 1354   HGBUR SMALL (A) 09/08/2020 1939   BILIRUBINUR NEGATIVE 09/08/2020 1939   BILIRUBINUR negative 07/08/2018 1523   KETONESUR NEGATIVE 09/08/2020 1939   PROTEINUR 100 (A) 09/08/2020 1939   UROBILINOGEN 0.2 06/27/2020 1354   NITRITE  NEGATIVE 09/08/2020 1939   LEUKOCYTESUR NEGATIVE 09/08/2020 1939   Recent Results (from the past 240 hour(s))  Culture, blood (x 2)     Status: None   Collection Time: 09/08/20  3:02 PM   Specimen: BLOOD  Result Value Ref Range Status   Specimen Description   Final    BLOOD RIGHT ANTECUBITAL Performed at [facility], [place] [address], [place], [place] [address]    Special Requests   Final    BOTTLES DRAWN AEROBIC AND ANAEROBIC Blood Culture adequate volume Performed at [facility] [address] [place], [place] [address]    Culture   Final    NO GROWTH 5 DAYS Performed at [facility], [place] [address], [place], [place] [address]    Report Status 09/13/2020 FINAL  Final  Culture, blood (x 2)     Status: Abnormal   Collection Time: 09/08/20  3:03 PM   Specimen: BLOOD  Result Value Ref Range Status   Specimen Description   Final    BLOOD LEFT ANTECUBITAL Performed at [facility] [address], [place], [place] [address]    Special Requests   Final    BOTTLES DRAWN AEROBIC AND ANAEROBIC Blood Culture adequate volume Performed at [facility] [address], [place], [place] [address]    Culture  Setup Time   Final    GRAM NEGATIVE RODS AEROBIC BOTTLE ONLY CRITICAL RESULT CALLED TO, READ BACK BY AND VERIFIED WITH: A. PHAM PHARMD, AT 1247 09/09/20 BY Rush [place]mark Performed at [facility], [place] [address] [place], [place] [address]    Culture PSEUDOMONAS AERUGINOSA (A)  Final   Report Status 09/11/2020 FINAL  Final   Organism ID, Bacteria PSEUDOMONAS AERUGINOSA  Final      Susceptibility   Pseudomonas aeruginosa - MIC*    CEFTAZIDIME 4 SENSITIVE Sensitive     CIPROFLOXACIN <=0.25 SENSITIVE Sensitive     GENTAMICIN 8 INTERMEDIATE Intermediate     IMIPENEM 2 SENSITIVE Sensitive     PIP/TAZO 16 SENSITIVE Sensitive     * PSEUDOMONAS AERUGINOSA  Blood Culture ID Panel (Reflexed)      Status: Abnormal   Collection Time: 09/08/20  3:03 PM  Result Value Ref Range Status   Enterococcus faecalis NOT DETECTED NOT DETECTED Final   Enterococcus  Faecium NOT DETECTED NOT DETECTED Final   Listeria monocytogenes NOT DETECTED NOT DETECTED Final   Staphylococcus species NOT DETECTED NOT DETECTED Final   Staphylococcus aureus (BCID) NOT DETECTED NOT DETECTED Final   Staphylococcus epidermidis NOT DETECTED NOT DETECTED Final   Staphylococcus lugdunensis NOT DETECTED NOT DETECTED Final   Streptococcus species NOT DETECTED NOT DETECTED Final   Streptococcus agalactiae NOT DETECTED NOT DETECTED Final   Streptococcus pneumoniae NOT DETECTED NOT DETECTED Final   Streptococcus pyogenes NOT DETECTED NOT DETECTED Final   A.calcoaceticus-baumannii NOT DETECTED NOT DETECTED Final   Bacteroides fragilis NOT DETECTED NOT DETECTED Final   Enterobacterales NOT DETECTED NOT DETECTED Final   Enterobacter cloacae complex NOT DETECTED NOT DETECTED Final   Escherichia coli NOT DETECTED NOT DETECTED Final   Klebsiella aerogenes NOT DETECTED NOT DETECTED Final   Klebsiella oxytoca NOT DETECTED NOT DETECTED Final   Klebsiella pneumoniae NOT DETECTED NOT DETECTED Final   Proteus species NOT DETECTED NOT DETECTED Final   Salmonella species NOT DETECTED NOT DETECTED Final   Serratia marcescens NOT DETECTED NOT DETECTED Final   Haemophilus influenzae NOT DETECTED NOT DETECTED Final   Neisseria meningitidis NOT DETECTED NOT DETECTED Final   Pseudomonas aeruginosa DETECTED (A) NOT DETECTED Final    Comment: CRITICAL RESULT CALLED TO, READ BACK BY AND VERIFIED WITH: A. PHAM PHARMD, AT 1247 09/09/20 BY D. VANHOOK    Stenotrophomonas maltophilia NOT DETECTED NOT DETECTED Final   Candida albicans NOT DETECTED NOT DETECTED Final   Candida auris NOT DETECTED NOT DETECTED Final   Candida glabrata NOT DETECTED NOT DETECTED Final   Candida krusei NOT DETECTED NOT DETECTED Final   Candida parapsilosis NOT DETECTED  NOT DETECTED Final   Candida tropicalis NOT DETECTED NOT DETECTED Final   Cryptococcus neoformans/gattii NOT DETECTED NOT DETECTED Final   CTX-M ESBL NOT DETECTED NOT DETECTED Final   Carbapenem resistance IMP NOT DETECTED NOT DETECTED Final   Carbapenem resistance KPC NOT DETECTED NOT DETECTED Final   Carbapenem resistance NDM NOT DETECTED NOT DETECTED Final   Carbapenem resistance VIM NOT DETECTED NOT DETECTED Final    Comment: Performed at [facility] [address], [place], [place] [address]  Urine culture     Status: Abnormal   Collection Time: 09/08/20  7:39 PM   Specimen: Urine, Catheterized  Result Value Ref Range Status   Specimen Description   Final    URINE, CATHETERIZED Performed at [name] [address], [place], [place] [address]    Special Requests   Final    NONE Performed at [facility], [place] [address] [place], [place] [address]    Culture MULTIPLE SPECIES PRESENT, SUGGEST RECOLLECTION (A)  Final   Report Status 09/10/2020 FINAL  Final      Radiology Studies: DG Chest Port 1 View  Result Date: 09/12/2020 CLINICAL DATA:  Respiratory failure.  Chest tube. EXAM: PORTABLE CHEST 1 VIEW COMPARISON:  09/11/2020. FINDINGS: Right chest tube in stable position. Interim improvement of right apical pneumothorax with mild residual. Bilateral multifocal pulmonary infiltrates again noted without interim change. Bibasilar atelectasis. Small right pleural effusion today's exam. Heart size normal. IMPRESSION: 1. Right chest tube in stable position. Interim improvement of right apical pneumothorax with mild residual. 2. Bilateral multifocal pulmonary infiltrates again noted without interim change. Bibasilar atelectasis. Small right pleural effusion noted on today's exam. Electronically Signed   By: Marcello Moores  Register   On: 09/12/2020 05:35    Scheduled Meds: . mouth rinse  15 mL Mouth Rinse BID  . scopolamine  1 patch Transdermal  Q72H  . sodium chloride flush  10 mL Intracatheter Q8H   Continuous Infusions: . sodium chloride 10 mL/hr at 09/11/20 0600  . ceFEPime (MAXIPIME) IV 2 g (09/13/20 1724)     LOS: 17 days   Time spent: 25 minutes.  Patrecia Pour, MD [facility] [email or web address] 09/13/2020, 5:33 PM

## 2020-09-13 NOTE — Progress Notes (Signed)
Called to room, pt unchanged, talked with family briefly, shallow and diminished breathing. Appears not to be in any distress or restlessness, looks peaceful and quiet. Will cont to monitor. SRP, RN

## 2020-09-13 NOTE — Progress Notes (Addendum)
Pt with faint shallow breathing no distress, O2 unchanged. Wife and sister in law at bedside. Changed pillowcases and pads arms weeping. Pt did not respond to soft gentle touch. SRP,RN

## 2020-09-13 NOTE — Plan of Care (Signed)
  Problem: Education: Goal: Knowledge of General Education information will improve Description Including pain rating scale, medication(s)/side effects and non-pharmacologic comfort measures Outcome: Progressing   

## 2020-09-13 NOTE — Progress Notes (Signed)
Follow-up visit along a continuum of care for this family. The patient has been moved to comfort care now as compared to our last visit on Monday when the wife hoped to be taking her husband home.  Today the wife was somewhat tearful. It was apparent she was being very valiant in not totally breaking down.  She is aware her husband of 82 years will not be here with her much longer.  Wife told the story of their marriage and the devotion of her husband.  Her sister was present and spoke of how theirs was a match at the level of being one another's soul mate.  Prayed with wife expressing her wishes for strength to endure.    Chaplain available for follow-up care.  [place]

## 2020-09-13 NOTE — Progress Notes (Signed)
Pt stable this am, O2 currently 100% on 14 Liters. Family request O2 is maintained. Chest tube patent, NO urine output in external catheter since early am on 9/28, Flexi-seal patent, NO output of stool in the past  2 days. Pt appears comfortable and presents NO distress, Family sleeping.Marland KitchenMarland KitchenWill cont to monitor. SRP, RN

## 2020-09-14 DIAGNOSIS — J9383 Other pneumothorax: Secondary | ICD-10-CM

## 2020-09-14 DIAGNOSIS — Z9689 Presence of other specified functional implants: Secondary | ICD-10-CM

## 2020-09-14 DIAGNOSIS — R0602 Shortness of breath: Secondary | ICD-10-CM

## 2020-09-14 DIAGNOSIS — Z515 Encounter for palliative care: Secondary | ICD-10-CM

## 2020-09-15 NOTE — Death Summary Note (Addendum)
DEATH SUMMARY   Patient Details  Name: Tony Marshall MRN: [id number] DOB: 05-Oct-1941  Admission/Discharge Information   Admit Date:  19-Sep-2020  Date of Death: October 08, 2020   Time of Death: 4:05pm  Length of Stay: 18  Referring Physician: Timoteo Gaul, FNP   Reason(s) for Hospitalization  Lethargy, pneumonia, shock  Diagnoses  Preliminary cause of death: Acute hypoxic respiratory failure Secondary Diagnoses (including complications and co-morbidities):  Active Problems:   CLL (chronic lymphocytic leukemia) (HCC)   Acute respiratory failure (HCC)   Other pancytopenia (HCC)   Acute combined systolic and diastolic heart failure (HCC)   Shock circulatory (HCC)   Septic shock (HCC)   Iatrogenic pneumothorax on R    AKI (acute kidney injury) ([place])   Pressure injury of skin   Pneumothorax on right   HCAP (healthcare-associated pneumonia)   Severe protein-calorie malnutrition (HCC)   Hypokalemia   Hypomagnesemia   Hypophosphatemia   Acute metabolic encephalopathy   Acute pneumothorax   Central line complication   Dying care   Shortness of breath   Brief Hospital Course (including significant findings, care, treatment, and services provided and events leading to death)  Tony Marshall is a 79 y.o. year old male who has a history of chronic systolic CHF, and CLL on chemotherapy who presented with septic shock and pneumonia. He developed large right-sided iatrogenic pneumothorax, now status post chest tube and PCCM continue to follow. Hospital course complicated by ongoing poor oral intake, failure to thrive, persisting right pneumothorax, new sepsis 9/24 secondary to Pseudomonas bacteremia from likely aspiration pneumonia. Due to persistent clinical decline despite maximal medical therapy, palliative care discussions were continued with the recommendation by the medical team to convert to comfort measures based on medical futility. The palliative care medicine team was  consulted and the patient and family were supported holistically with conversion to comfort measures on 2023/10/07 and subsequent death on 2023/10/09.   Pertinent Labs and Studies  Significant Diagnostic Studies DG Chest 1 View  Result Date: 09/11/2020 CLINICAL DATA:  Pneumothorax, pneumonia. EXAM: CHEST  1 VIEW COMPARISON:  Same day. FINDINGS: Stable cardiomegaly. Right-sided chest tube is again noted. Mild right apical pneumothorax is noted which is decreased compared to prior exam. Stable bilateral lung opacities are noted concerning for pneumonia. Probable small left pleural effusion is noted. Bony thorax is unremarkable. IMPRESSION: Mild right apical pneumothorax is noted which is decreased compared to prior exam. Stable bilateral lung opacities concerning for pneumonia. Probable small left pleural effusion. Electronically Signed   By: Marijo Conception M.D.   On: 09/11/2020 13:30   DG Abd 1 View  Result Date: 08/31/2020 CLINICAL DATA:  Encounter for feeding tube placement EXAM: ABDOMEN - 1 VIEW COMPARISON:  Abdominal radiograph of 08/30/2020 FINDINGS: RIGHT-sided chest tube remains in place. Small amount of subcutaneous emphysema. Additional chest tube has been placed since previous chest radiograph. Tip of this chest tube cannot be seen, better seen on subsequent chest radiograph. The RIGHT hemidiaphragm no longer shows depression and lucency that was noted on the previous exam. There is RIGHT and LEFT basilar airspace disease likely with LEFT-sided pleural fluid layering dependently. IMPRESSION: 1. Interval improvement with respect to basilar portion of pneumothorax seen on previous imaging following placement of a second chest tube. 2. Effusion and basilar airspace disease on the LEFT. 3. Feeding tube in the mid stomach. Electronically Signed   By: Zetta Bills M.D.   On: 08/31/2020 11:26   DG Abd 1 View  Result  Date: 08/30/2020 CLINICAL DATA:  Nasogastric tube placement EXAM: ABDOMEN - 1 VIEW  COMPARISON:  5:46 p.m. FINDINGS: Nasoenteric feeding tube has been partially withdrawn and its tip is now seen just beyond the expected gastroesophageal junction. Visualized abdominal gas pattern is unremarkable. Small left pleural effusion is present. Right basilar pigtail chest tube is unchanged in position. There is depression of the right hemidiaphragm in keeping with the presence of a large right pneumothorax, however, the degree of diaphragmatic depression and right lung hyperinflation has improved when compared to prior examination suggesting at least partial decompression through the chest tube. IMPRESSION: 1. Nasoenteric feeding tube has been partially withdrawn and its tip is now seen just beyond the expected gastroesophageal junction. Advancement of the catheter by at least 20 cm may more optimally position the catheter within the duodenum. 2. Improved right-sided pneumothorax, however with persistent diaphragmatic depression and right lung hyperinflation. Correlation for appropriate function of the chest tube is recommended. Electronically Signed   By: Fidela Salisbury MD   On: 08/30/2020 21:47   DG Chest Port 1 View  Result Date: 09/12/2020 CLINICAL DATA:  Respiratory failure.  Chest tube. EXAM: PORTABLE CHEST 1 VIEW COMPARISON:  09/11/2020. FINDINGS: Right chest tube in stable position. Interim improvement of right apical pneumothorax with mild residual. Bilateral multifocal pulmonary infiltrates again noted without interim change. Bibasilar atelectasis. Small right pleural effusion today's exam. Heart size normal. IMPRESSION: 1. Right chest tube in stable position. Interim improvement of right apical pneumothorax with mild residual. 2. Bilateral multifocal pulmonary infiltrates again noted without interim change. Bibasilar atelectasis. Small right pleural effusion noted on today's exam. Electronically Signed   By: Marcello Moores  Register   On: 09/12/2020 05:35   DG Chest Port 1 View  Result Date:  09/11/2020 CLINICAL DATA:  Follow-up right pneumothorax EXAM: PORTABLE CHEST 1 VIEW COMPARISON:  09/10/2020 FINDINGS: Cardiac enlargement. Right chest tube remains in place. Persistent right pneumothorax appears increased since size since previous study with persistent collapse and consolidation of the right lung. Left perihilar infiltration. Small left pleural effusion. Calcification of the aorta. IMPRESSION: Persistent right pneumothorax, increasing since previous study with persistent collapse and consolidation of the right lung. Right chest tube remains in place. Cardiac enlargement. Small left pleural effusion. Electronically Signed   By: Lucienne Capers M.D.   On: 09/11/2020 05:38   DG CHEST PORT 1 VIEW  Result Date: 09/10/2020 CLINICAL DATA:  Pneumothorax. EXAM: PORTABLE CHEST 1 VIEW COMPARISON:  Radiograph earlier today. FINDINGS: Pigtail catheter in the right hemithorax with tip in the mid upper lung zone. Slight increased size in the right pneumothorax from prior exam visualized the apex and laterally. Lucency about the right mediastinum is likely medial component. Pneumothorax appears moderate in size. No significant change in the multifocal airspace opacities in the right hemithorax. Stable heart size and mediastinal contours. Volume loss at the left lung base with hazy opacity likely pleural effusion. Streaky left suprahilar opacities are stable. There is no left pleural pneumothorax. IMPRESSION: 1. Slight increased size of right pneumothorax from prior exam, now moderate in size. Right pigtail catheter remains in place. 2. Unchanged multifocal airspace opacities in the right hemithorax. 3. Stable hazy opacity at the left lung base and suprahilar opacities. These results will be called to the ordering clinician or representative by the Radiologist Assistant, and communication documented in the PACS or [employer]. Electronically Signed   By: Keith Rake M.D.   On: 09/10/2020 17:35   DG  Chest [facility]  Result Date: 09/10/2020 CLINICAL DATA:  Pneumothorax on the right EXAM: PORTABLE CHEST 1 VIEW COMPARISON:  Yesterday FINDINGS: Small right apical pneumothorax, 3 rib spaces in height. Chest tube is in stable position. Extensive right-sided upper lobe airspace disease since yesterday. Hazy density at the left base where there was likely pleural fluid. Streaky left perihilar density. IMPRESSION: 1. Small right apical pneumothorax with mild increase from the second film yesterday. 2. New extensive right upper lobe airspace disease, suspect re-expansion edema given the degree of right pulmonary collapse on the first film yesterday. Electronically Signed   By: Monte Fantasia M.D.   On: 09/10/2020 07:41   DG Chest Port 1 View  Result Date: 09/09/2020 CLINICAL DATA:  Follow-up chest tube and pneumothorax EXAM: PORTABLE CHEST 1 VIEW COMPARISON:  09/09/20 FINDINGS: Cardiac shadow is stable in appearance. Aortic calcifications are seen. Pigtail catheter is again seen on the right with near complete resolution of previously seen right-sided pneumothorax. Small right-sided pleural effusion is noted. Right basilar infiltrate remains. Persistent infiltrative change in left base is noted. IMPRESSION: Near complete resolution of right-sided pneumothorax. Persistent bilateral opacities with effusions. Electronically Signed   By: Inez Catalina M.D.   On: 09/09/2020 13:57   DG Chest Port 1 View  Result Date: 09/09/2020 CLINICAL DATA:  Follow-up pneumothorax. EXAM: PORTABLE CHEST 1 VIEW COMPARISON:  09/08/2020 FINDINGS: The RIGHT pneumothorax is now very large with complete collapse/atelectasis of the RIGHT lung. There is no definite midline shift. A RIGHT thoracostomy tube is again noted. LEFT lung airspace disease and LEFT LOWER lung atelectasis/consolidation again noted. No other interval change identified. IMPRESSION: Large RIGHT pneumothorax with complete collapse/atelectasis of the RIGHT lung. RIGHT  thoracostomy tube remains in place. No definite midline shift. LEFT lung airspace disease and LEFT LOWER lung atelectasis/consolidation again noted. Critical Value/emergent results were called by telephone at the time of interpretation on 09/09/2020 at 7:36 am to provider Gregary Signs, nurse for this patient, who verbally acknowledged these results. Electronically Signed   By: Margarette Canada M.D.   On: 09/09/2020 07:38   DG Chest Port 1 View  Result Date: 09/08/2020 CLINICAL DATA:  Pneumothorax. EXAM: PORTABLE CHEST 1 VIEW COMPARISON:  09/07/2020. FINDINGS: Right chest tube in stable position. Stable small right pneumothorax. Heart size normal. Multifocal bilateral pulmonary infiltrates are again noted. Small bilateral pleural effusions again noted. Degenerative change thoracic spine. IMPRESSION: 1. Right chest tube in stable position. Small right apical pneumothorax again noted without interim change. 2. Multifocal bilateral pulmonary infiltrates are again noted. Similar findings on prior exam. Small bilateral pleural effusions again noted. Electronically Signed   By: Marcello Moores  Register   On: 09/08/2020 07:47   DG CHEST PORT 1 VIEW  Result Date: 09/07/2020 CLINICAL DATA:  Recent pneumothorax with chest tube in place EXAM: PORTABLE CHEST 1 VIEW COMPARISON:  September 07, 2020 study obtained earlier in the day. FINDINGS: Chest tube on the right is unchanged position with persistent small right apical pneumothorax. No tension component. There is a questionable small right pleural effusion, not seen earlier in the day. There is a pleural effusion on the left. There is airspace opacity in the right mid lung as well as in the left perihilar and lower lobe regions. There is apparent scarring in the left upper lobe. Heart is mildly enlarged, stable, with pulmonary vascularity grossly stable. No adenopathy appreciable by radiography. No bone lesions. There is aortic atherosclerosis. IMPRESSION: Chest tube position on the right  unchanged. Small right apical pneumothorax without tension component. Areas  of airspace opacity bilaterally. Suspect new small right pleural effusion. Stable pleural effusion on the left. Stable scarring left upper lobe. Aortic Atherosclerosis (ICD10-I70.0). Electronically Signed   By: Lowella Grip III M.D.   On: 09/07/2020 10:40   DG CHEST PORT 1 VIEW  Result Date: 09/07/2020 CLINICAL DATA:  Pneumothorax.  Chest tube. EXAM: PORTABLE CHEST 1 VIEW COMPARISON:  09/06/2020. FINDINGS: Right chest tube in stable position. Stable small right apical pneumothorax. Mediastinum is stable. Heart size stable. Bilateral multifocal pulmonary infiltrates again noted. Small left pleural effusion again noted. IMPRESSION: 1. Right chest tube in stable position. Small right apical pneumothorax again noted. 2. Multifocal bilateral pulmonary infiltrates again noted without interim change. Small left pleural effusion again noted. Electronically Signed   By: Marcello Moores  Register   On: 09/07/2020 06:12   DG CHEST PORT 1 VIEW  Result Date: 09/06/2020 CLINICAL DATA:  Pleural effusion EXAM: PORTABLE CHEST 1 VIEW COMPARISON:  09/05/2020 FINDINGS: Right chest tube remains in place. Small residual right apical pneumothorax, stable. Patchy bilateral airspace disease. Small bilateral pleural effusions. IMPRESSION: No significant change since prior study. Continued patchy bilateral airspace disease, left greater than right with small effusions. Stable right apical pneumothorax. Electronically Signed   By: Rolm Baptise M.D.   On: 09/06/2020 11:47   DG CHEST PORT 1 VIEW  Result Date: 09/05/2020 CLINICAL DATA:  Pneumothorax. EXAM: PORTABLE CHEST 1 VIEW COMPARISON:  09/04/2020. FINDINGS: Interim removal of feeding tube. Right chest tube in stable position. Stable small right apical pneumothorax. Stable cardiomegaly. Bibasilar atelectasis. Bilateral pulmonary infiltrates/edema. Slight progression from prior exam. Small bilateral pleural  effusions again noted. IMPRESSION: 1. Interim removal of feeding tube. Right chest tube in stable position. Stable small right apical pneumothorax. 2.  Stable cardiomegaly. 3. Bibasilar atelectasis. Bilateral pulmonary infiltrates/edema again noted. Slight progression from prior exam. Small bilateral pleural effusions again noted. Electronically Signed   By: Marcello Moores  Register   On: 09/05/2020 05:36   DG CHEST PORT 1 VIEW  Result Date: 09/04/2020 CLINICAL DATA:  Shortness of breath, pneumothorax. EXAM: PORTABLE CHEST 1 VIEW COMPARISON:  Same day. FINDINGS: Stable cardiomediastinal silhouette. Right-sided chest tube is again noted. Small right apical pneumothorax is noted which is decreased compared to prior exam. Stable bibasilar opacities are noted with associated pleural effusions, left greater than right. Bony thorax is unremarkable. Feeding tube is seen entering stomach. IMPRESSION: Right-sided chest tube is again noted. Small right apical pneumothorax is noted which is decreased compared to prior exam. Stable bibasilar opacities are noted with associated pleural effusions, left greater than right. Electronically Signed   By: Marijo Conception M.D.   On: 09/04/2020 13:11   DG CHEST PORT 1 VIEW  Result Date: 09/04/2020 CLINICAL DATA:  Pneumothorax follow-up. EXAM: PORTABLE CHEST 1 VIEW COMPARISON:  09/03/2020 chest radiograph and prior. FINDINGS: Apically terminating right chest tube. There is a mild kink involving the proximal tubing. Enteric tube tip overlies the gastric body. Right pneumothorax is increased in size and with new inferior component. Bilateral interstitial and patchy airspace opacities are unchanged. No left pneumothorax. Small left pleural effusion, unchanged. Partially obscured cardiomediastinal silhouette. IMPRESSION: Increased size of right pneumothorax. Indwelling apically terminating right chest tube with mild proximal kinking. Bilateral pulmonary opacities and small left pleural  effusion, unchanged. These results will be called to the ordering clinician or representative by the Radiologist Assistant, and communication documented in the PACS or [employer]. Electronically Signed   By: Primitivo Gauze M.D.   On: 09/04/2020 09:12  DG CHEST PORT 1 VIEW  Result Date: 09/03/2020 CLINICAL DATA:  Follow-up pneumothorax. EXAM: PORTABLE CHEST 1 VIEW COMPARISON:  09/02/2020 FINDINGS: Stable position of right-sided chest tube. Right-sided pneumothorax is again identified and appears similar in volume to the previous exam predominantly overlying the medial right upper lobe and right apex. Unchanged appearance of bilateral pleural effusions. Bilateral interstitial and airspace opacities are also unchanged. IMPRESSION: 1. No change in right-sided pneumothorax. 2. No change in bilateral interstitial and airspace opacities. 3. Stable bilateral pleural effusions. Electronically Signed   By: Kerby Moors M.D.   On: 09/03/2020 08:17   DG CHEST PORT 1 VIEW  Result Date: 09/02/2020 CLINICAL DATA:  Follow-up pneumothorax right side. EXAM: PORTABLE CHEST 1 VIEW COMPARISON:  09/02/2020 FINDINGS: Enteric tube unchanged coursing into the region of the stomach and off the film as tip is not visualized. Right-sided pigtail pleural drainage catheter unchanged. Previously seen right-sided pneumothorax is not visualized on the current exam. Mild interval improvement hazy opacification over the left perihilar region/left base. Persistent hazy density over the medial right base unchanged. Cardiomediastinal silhouette and remainder of the exam is unchanged. IMPRESSION: 1. Interval improvement in hazy opacification over the left perihilar region/left base. Stable hazy density medial right base. 2. Right-sided pigtail pleural drainage catheter unchanged. Previously seen right-sided pneumothorax no longer visualized. Electronically Signed   By: Marin Olp M.D.   On: 09/02/2020 11:31   DG Chest Port 1  View  Result Date: 09/02/2020 CLINICAL DATA:  Follow-up pneumothorax. EXAM: PORTABLE CHEST 1 VIEW COMPARISON:  09/01/2020 FINDINGS: Right-sided chest tube is in place. The pigtail overlies the right upper lobe. Recurrent small to moderate right-sided pneumothorax is identified. Persistent opacification within the right mid and lower lung. Diffuse opacification of the left lung, unchanged. Small pleural effusions, stable. IMPRESSION: 1. Recurrent small to moderate right-sided pneumothorax. 2. No change in aeration to the lungs compared with previous exam. 3. Critical Value/emergent results were called by telephone at the time of interpretation on 09/02/2020 at 6:57 am to provider Nurse Karrie Doffing, who verbally acknowledged these results. Electronically Signed   By: Kerby Moors M.D.   On: 09/02/2020 06:57   DG Chest Port 1 View  Result Date: 09/01/2020 CLINICAL DATA:  Pneumothorax EXAM: PORTABLE CHEST 1 VIEW COMPARISON:  08/31/2020 FINDINGS: Interval removal of 1 of the 2 right chest tubes. No visible pneumothorax. Airspace disease throughout the left lung with possible layering left effusion, unchanged. Heart is borderline in size. IMPRESSION: Interval removal of 1 of the 2 right chest tubes without visible pneumothorax. Stable hazy opacity throughout the left lung, likely layering effusion and airspace disease. Electronically Signed   By: Rolm Baptise M.D.   On: 09/01/2020 04:46   DG CHEST PORT 1 VIEW  Result Date: 08/31/2020 CLINICAL DATA:  Encounter for chest tube placement EXAM: PORTABLE CHEST 1 VIEW COMPARISON:  Earlier today FINDINGS: A new chest tube has been placed with tip at the apex. The right base tube is in stable position. Significant evacuation of right pneumothorax with no definite residual seen. Hazy opacification of the left chest from pleural fluid and pulmonary density. Feeding tube has been placed with indwelling wire and weight over the stomach. Stable heart size. IMPRESSION: 1.  Additional right chest tube with evacuated pneumothorax. 2. Continued hazy opacification of the left chest, likely both pleural fluid and pulmonary opacity. Electronically Signed   By: Monte Fantasia M.D.   On: 08/31/2020 11:23   DG CHEST PORT 1 VIEW  Result  Date: 08/31/2020 CLINICAL DATA:  Pneumothorax EXAM: PORTABLE CHEST 1 VIEW COMPARISON:  August 31, 2020 FINDINGS: The cardiomediastinal silhouette is unchanged in contour.Atherosclerotic calcifications of the aorta. Small LEFT pleural effusion. Persistent moderate RIGHT pneumothorax, minimally decreased in comparison to prior. Persistent opacification of the RIGHT lung along the RIGHT lower hilar border. Mildly improved aeration of the LEFT lung. Patchy areas of atelectasis and edema throughout the LEFT lung. Visualized abdomen is unremarkable. Multilevel degenerative changes of the thoracic spine. IMPRESSION: 1. Persistent moderate RIGHT pneumothorax, minimally decreased to similar in comparison to prior. 2. Persistent opacification of the RIGHT lung along the RIGHT lower hilar border, likely atelectasis. 3. Mildly improved aeration of the LEFT lung. Electronically Signed   By: Valentino Saxon MD   On: 08/31/2020 09:18   DG CHEST PORT 1 VIEW  Result Date: 08/31/2020 CLINICAL DATA:  Follow-up pneumothorax. EXAM: PORTABLE CHEST 1 VIEW COMPARISON:  08/29/2020 FINDINGS: The right-sided chest tube is stable in position. Enlarging right pneumothorax now estimated at 50%. Mild shift of the heart to the left side and lower left lung volumes with progressive left lung atelectasis. IMPRESSION: Enlarging right pneumothorax now estimated at 50%. Some component of a tension pneumothorax is noted with slight shift of the heart to the left and lower left lung volumes with compressive atelectasis. These results will be called to the ordering clinician or representative by the Radiologist Assistant, and communication documented in the PACS or [employer].  Electronically Signed   By: Marijo Sanes M.D.   On: 08/31/2020 06:41   DG CHEST PORT 1 VIEW  Result Date: 08/29/2020 CLINICAL DATA:  Follow-up pneumothorax. EXAM: PORTABLE CHEST 1 VIEW COMPARISON:  08/28/2020 FINDINGS: Right chest tube remains in place. Enlargement of the right pneumothorax. Visceral to parietal pleural density in the right upper chest 3 cm compared with 2 cm yesterday. Moderate atelectasis in both lower lobes. No left pneumothorax. Probable small effusion on the left. Mild cardiomegaly and aortic atherosclerosis/coronary artery atherosclerosis again noted. IMPRESSION: 1. Enlargement of the right pneumothorax. Right chest tube remains in place. No sign of tension or mediastinal shift. 2. Moderate atelectasis in both lower lobes. Probable small left effusion. Electronically Signed   By: Nelson Chimes M.D.   On: 08/29/2020 10:40   DG CHEST PORT 1 VIEW  Result Date: 08/28/2020 CLINICAL DATA:  Shortness of breath EXAM: PORTABLE CHEST 1 VIEW COMPARISON:  Earlier today FINDINGS: Decrease in right pneumothorax, no longer seen at the base. Moderate pneumothorax still at the right apex, although decreased at 2 cm in thickness compared to 3 cm earlier. Stable lower lobe opacity and left pleural effusion. Coronary and mitral annular calcification. IMPRESSION: Decreased right pneumothorax. Electronically Signed   By: Monte Fantasia M.D.   On: 08/28/2020 10:06   DG Chest Port 1 View  Result Date: 08/28/2020 CLINICAL DATA:  Acute respiratory failure EXAM: PORTABLE CHEST 1 VIEW COMPARISON:  Yesterday FINDINGS: Right chest tube in place. There is a moderate right pneumothorax which is increasing at the base where hazy density is decreased and there may have been evacuation of pleural fluid. Pneumothorax thickness at the apex is similar to before at nearly 3 cm. Airspace opacity at the right lower lobe. Continued hazy opacity at the left base from both pleural fluid and parenchymal disease. Normal heart  size. No mediastinal shift. These results will be called to the ordering clinician or representative by the Radiologist Assistant, and communication documented in the PACS or [employer]. IMPRESSION: 1. Moderate right pneumothorax  with interval increase at the base where there may have been interval evacuation of pleural fluid. At the apex, thickness is unchanged at 3 cm. 2. Bilateral lower lobe opacity and left pleural effusion. Electronically Signed   By: Monte Fantasia M.D.   On: 08/28/2020 05:39   DG Chest Port 1 View  Result Date: 08/27/2020 CLINICAL DATA:  RIGHT pneumothorax. EXAM: PORTABLE CHEST 1 VIEW COMPARISON:  Radiograph 08/27/2020 at 447 hours FINDINGS: Interval placement of a RIGHT pigtail catheter chest tube. Persistent RIGHT pneumothorax which measures 33 mm from the apical chest wall compared to 39 mm for minimal expansion. Persistent bilateral pleural effusions. No mediastinal shift identified. IMPRESSION: 1. Interval placement of RIGHT chest tube with persistent RIGHT pneumothorax. Mild improvement. 2. Persistent bilateral pleural effusions. Electronically Signed   By: Suzy Bouchard M.D.   On: 08/27/2020 06:27   DG Chest Port 1 View  Result Date: 08/27/2020 CLINICAL DATA:  Initial evaluation for unsuccessful central line placement. EXAM: PORTABLE CHEST 1 VIEW COMPARISON:  Prior radiograph from 09/10/2020. FINDINGS: Cardiac and mediastinal silhouettes are stable, and remain within normal limits. Aortic atherosclerosis. Lungs mildly hypoinflated. Interval development of a moderate-sized right-sided pneumothorax. No mediastinal shift. Veiling opacities overlying the bilateral hemidiaphragms consistent with pleural effusions. Superimposed patchy bibasilar opacities could reflect atelectasis or infiltrates, slightly worsened at the right lung base from previous. No acute osseous finding. IMPRESSION: 1. Interval development of moderate right-sided pneumothorax. No mediastinal shift. 2.  Veiling opacities overlying the bilateral hemidiaphragms, consistent with bilateral pleural effusions. 3. Superimposed patchy bibasilar opacities, slightly worsened at the right lung base as compared to previous. Critical Value/emergent results were called by telephone at the time of interpretation on 08/27/2020 at 5:21 am to provider [facility] , who verbally acknowledged these results. Electronically Signed   By: Jeannine Boga M.D.   On: 08/27/2020 05:22   DG Chest Port 1 View  Result Date: 09/11/2020 CLINICAL DATA:  Weakness. EXAM: PORTABLE CHEST 1 VIEW COMPARISON:  08/10/2020 FINDINGS: The heart size is unremarkable. There are new moderate to large bilateral pleural effusions. There are hazy perihilar and bibasilar airspace opacities which are new from prior study. There is no pneumothorax. No acute osseous abnormality. Aortic calcifications are noted. IMPRESSION: 1. New moderate to large bilateral pleural effusions. 2. Worsening bibasilar airspace disease may represent consolidation or atelectasis. Electronically Signed   By: Constance Holster M.D.   On: 08/30/2020 17:17   DG Abd Portable 1V  Result Date: 08/30/2020 CLINICAL DATA:  Feeding tube placement EXAM: PORTABLE ABDOMEN-1 VIEW COMPARISON:  X-ray abdomen 07/07/2020, chest x-ray 08/29/2020 FINDINGS: Interval placement of enteric tube noted to course below the diaphragm with tip overlying the gastric lumen. Right pigtail catheter overlying the right mid hemithorax. Interval increase in size of a large right pneumothorax. Small left pleural effusion. Severe coronary artery calcification. IMPRESSION: 1. Enteric tube coursing below the diaphragm with tip overlying the expected region of the gastric lumen. 2. Interval increase in size of a large pneumothorax (compared to chest x-ray 08/29/2020) with right chest tube pigtail catheter in similar position. 3. Small left pleural effusion. Electronically Signed   By: Iven Finn M.D.   On:  08/30/2020 19:32   DG Swallowing Func-Speech Pathology  Result Date: 09/10/2020 Objective Swallowing Evaluation: Type of Study: MBS-Modified Barium Swallow Study  Patient Details Name: Tony Marshall MRN: [id number] Date of Birth: 1941/12/09 Today's Date: 09/10/2020 Time: SLP Start Time (ACUTE ONLY): 9407 -SLP Stop Time (ACUTE ONLY): 1550 SLP Time Calculation (min) (  ACUTE ONLY): 20 min Past Medical History: Past Medical History: Diagnosis Date . A-fib ([place])  . CLL (chronic lymphocytic leukemia) ([place]) dx'd 2014  progression 12/2019 . Diabetes mellitus without complication ([place])  . Diverticulosis  . External hemorrhoids  . GERD (gastroesophageal reflux disease)  . Hiatal hernia  . History of colon polyps  . Hyperlipidemia  . Hypertension  . Internal hemorrhoids  . Lymphocytosis  . Schatzki's ring  . Sleep apnea  Past Surgical History: Past Surgical History: Procedure Laterality Date . BLEPHAROPLASTY   . COLONOSCOPY  2012 . FLEXIBLE SIGMOIDOSCOPY N/A 07/05/2020  Procedure: FLEXIBLE SIGMOIDOSCOPY;  Surgeon: Thornton Park, MD;  Location: [facility];  Service: Gastroenterology;  Laterality: N/A; . HEMORRHOID SURGERY    Sclerotherapy . KNEE SURGERY Right 2002 . SHOULDER SURGERY Right 3671 HPI: 79 year old male with PMH of chronic systolic CHF, CLL on chemotherapy presented with shock and pneumonia.  He developed large right-sided iatrogenic pneumothorax, now status post chest tube and PCCM continue to follow.  Hospital course complicated by ongoing poor oral intake, failure to thrive, persisting right pneumothorax, new sepsis 9/24 likely related to recurrent aspiration pneumonia.  Subjective: looks in general discomfort but alert and able to complete test Assessment / Plan / Recommendation CHL IP CLINICAL IMPRESSIONS 09/10/2020 Clinical Impression Patient presents with a mild oropharygneal dysphagia and suspected esophageal component as per patient and wife's report. Patient exhbited mild delay in anterior to  posterior propulsion of thin liquids and pureer solids in oral cavity. Pharyngeally, swallow function was [facility], with timely swallow initiation, epiglottic deflection with good airway protection, no instances of penetration or aspiration and no residuals in vallecular sinus or posterior pharyngeal wall. Patient did present with residuals in pyriform sinus which did not fully transit during MBS. Cannot r/o aspiration or penetration from pyriform residuals. SLP Visit Diagnosis Dysphagia, pharyngoesophageal phase (R13.14) Attention and concentration deficit following -- Frontal lobe and executive function deficit following -- Impact on safety and function Mild aspiration risk;Moderate aspiration risk   CHL IP TREATMENT RECOMMENDATION 09/10/2020 Treatment Recommendations Therapy as outlined in treatment plan below   Prognosis 09/10/2020 Prognosis for Safe Diet Advancement Guarded Barriers to Reach Goals Severity of deficits;Time post onset Barriers/Prognosis Comment -- CHL IP DIET RECOMMENDATION 09/10/2020 SLP Diet Recommendations Thin liquid;Regular solids Liquid Administration via Straw;Cup Medication Administration Crushed with puree Compensations Slow rate;Small sips/bites;Follow solids with liquid Postural Changes Remain semi-upright after after feeds/meals (Comment);Seated upright at 90 degrees   CHL IP OTHER RECOMMENDATIONS 09/10/2020 Recommended Consults -- Oral Care Recommendations Oral care BID Other Recommendations --   CHL IP FOLLOW UP RECOMMENDATIONS 09/10/2020 Follow up Recommendations None   CHL IP FREQUENCY AND DURATION 09/10/2020 Speech Therapy Frequency (ACUTE ONLY) min 1 x/week Treatment Duration 1 week      CHL IP ORAL PHASE 09/10/2020 Oral Phase Impaired Oral - Pudding Teaspoon -- Oral - Pudding Cup -- Oral - Honey Teaspoon -- Oral - Honey Cup -- Oral - Nectar Teaspoon -- Oral - Nectar Cup -- Oral - Nectar Straw -- Oral - Thin Teaspoon -- Oral - Thin Cup Reduced posterior propulsion Oral - Thin Straw Reduced  posterior propulsion Oral - Puree Reduced posterior propulsion Oral - Mech Soft -- Oral - Regular -- Oral - Multi-Consistency -- Oral - Pill -- Oral Phase - Comment --  CHL IP PHARYNGEAL PHASE 09/10/2020 Pharyngeal Phase Impaired Pharyngeal- Pudding Teaspoon -- Pharyngeal -- Pharyngeal- Pudding Cup -- Pharyngeal -- Pharyngeal- Honey Teaspoon -- Pharyngeal -- Pharyngeal- Honey Cup -- Pharyngeal -- Pharyngeal- Nectar Teaspoon --  Pharyngeal -- Pharyngeal- Nectar Cup -- Pharyngeal -- Pharyngeal- Nectar Straw -- Pharyngeal -- Pharyngeal- Thin Teaspoon -- Pharyngeal -- Pharyngeal- Thin Cup Pharyngeal residue - pyriform Pharyngeal -- Pharyngeal- Thin Straw Pharyngeal residue - pyriform Pharyngeal -- Pharyngeal- Puree Pharyngeal residue - pyriform Pharyngeal -- Pharyngeal- Mechanical Soft -- Pharyngeal -- Pharyngeal- Regular -- Pharyngeal -- Pharyngeal- Multi-consistency -- Pharyngeal -- Pharyngeal- Pill -- Pharyngeal -- Pharyngeal Comment --  CHL IP CERVICAL ESOPHAGEAL PHASE 09/10/2020 Cervical Esophageal Phase WFL Pudding Teaspoon -- Pudding Cup -- Honey Teaspoon -- Honey Cup -- Nectar Teaspoon -- Nectar Cup -- Nectar Straw -- Thin Teaspoon -- Thin Cup -- Thin Straw -- Puree -- Mechanical Soft -- Regular -- Multi-consistency -- Pill -- Cervical Esophageal Comment -- Sonia Baller, MA, CCC-SLP Speech Therapy              Microbiology Recent Results (from the past 240 hour(s))  Culture, blood (x 2)     Status: None   Collection Time: 09/08/20  3:02 PM   Specimen: BLOOD  Result Value Ref Range Status   Specimen Description   Final    BLOOD RIGHT ANTECUBITAL Performed at [facility], [place] [address], [place], [place] [address]    Special Requests   Final    BOTTLES DRAWN AEROBIC AND ANAEROBIC Blood Culture adequate volume Performed at [facility] [address], [place], [place] [address]    Culture   Final    NO GROWTH 5 DAYS Performed at [facility],  [place] [address] [place], [place] [address]    Report Status 09/13/2020 FINAL  Final  Culture, blood (x 2)     Status: Abnormal   Collection Time: 09/08/20  3:03 PM   Specimen: BLOOD  Result Value Ref Range Status   Specimen Description   Final    BLOOD LEFT ANTECUBITAL Performed at [facility] [address] [place], [place] [address]    Special Requests   Final    BOTTLES DRAWN AEROBIC AND ANAEROBIC Blood Culture adequate volume Performed at [facility] [address], [place], [place] [address]    Culture  Setup Time   Final    GRAM NEGATIVE RODS AEROBIC BOTTLE ONLY CRITICAL RESULT CALLED TO, READ BACK BY AND VERIFIED WITH: A. PHAM PHARMD, AT 1247 09/09/20 BY Rush Landmark Performed at [facility], [place] [address] [place], [place] [address]    Culture PSEUDOMONAS AERUGINOSA (A)  Final   Report Status 09/11/2020 FINAL  Final   Organism ID, Bacteria PSEUDOMONAS AERUGINOSA  Final      Susceptibility   Pseudomonas aeruginosa - MIC*    CEFTAZIDIME 4 SENSITIVE Sensitive     CIPROFLOXACIN <=0.25 SENSITIVE Sensitive     GENTAMICIN 8 INTERMEDIATE Intermediate     IMIPENEM 2 SENSITIVE Sensitive     PIP/TAZO 16 SENSITIVE Sensitive     * PSEUDOMONAS AERUGINOSA  Blood Culture ID Panel (Reflexed)     Status: Abnormal   Collection Time: 09/08/20  3:03 PM  Result Value Ref Range Status   Enterococcus faecalis NOT DETECTED NOT DETECTED Final   Enterococcus Faecium NOT DETECTED NOT DETECTED Final   Listeria monocytogenes NOT DETECTED NOT DETECTED Final   Staphylococcus species NOT DETECTED NOT DETECTED Final   Staphylococcus aureus (BCID) NOT DETECTED NOT DETECTED Final   Staphylococcus epidermidis NOT DETECTED NOT DETECTED Final   Staphylococcus lugdunensis NOT DETECTED NOT DETECTED Final   Streptococcus species NOT DETECTED NOT DETECTED  Final   Streptococcus agalactiae NOT DETECTED NOT DETECTED Final   Streptococcus pneumoniae NOT DETECTED NOT  DETECTED Final   Streptococcus pyogenes NOT DETECTED NOT DETECTED Final   A.calcoaceticus-baumannii NOT DETECTED NOT DETECTED Final   Bacteroides fragilis NOT DETECTED NOT DETECTED Final   Enterobacterales NOT DETECTED NOT DETECTED Final   Enterobacter cloacae complex NOT DETECTED NOT DETECTED Final   Escherichia coli NOT DETECTED NOT DETECTED Final   Klebsiella aerogenes NOT DETECTED NOT DETECTED Final   Klebsiella oxytoca NOT DETECTED NOT DETECTED Final   Klebsiella pneumoniae NOT DETECTED NOT DETECTED Final   Proteus species NOT DETECTED NOT DETECTED Final   Salmonella species NOT DETECTED NOT DETECTED Final   Serratia marcescens NOT DETECTED NOT DETECTED Final   Haemophilus influenzae NOT DETECTED NOT DETECTED Final   Neisseria meningitidis NOT DETECTED NOT DETECTED Final   Pseudomonas aeruginosa DETECTED (A) NOT DETECTED Final    Comment: CRITICAL RESULT CALLED TO, READ BACK BY AND VERIFIED WITH: A. PHAM PHARMD, AT 1247 09/09/20 BY D. VANHOOK    Stenotrophomonas maltophilia NOT DETECTED NOT DETECTED Final   Candida albicans NOT DETECTED NOT DETECTED Final   Candida auris NOT DETECTED NOT DETECTED Final   Candida glabrata NOT DETECTED NOT DETECTED Final   Candida krusei NOT DETECTED NOT DETECTED Final   Candida parapsilosis NOT DETECTED NOT DETECTED Final   Candida tropicalis NOT DETECTED NOT DETECTED Final   Cryptococcus neoformans/gattii NOT DETECTED NOT DETECTED Final   CTX-M ESBL NOT DETECTED NOT DETECTED Final   Carbapenem resistance IMP NOT DETECTED NOT DETECTED Final   Carbapenem resistance KPC NOT DETECTED NOT DETECTED Final   Carbapenem resistance NDM NOT DETECTED NOT DETECTED Final   Carbapenem resistance VIM NOT DETECTED NOT DETECTED Final    Comment: Performed at [facility] [address], [place], [place] [address]  Urine culture     Status: Abnormal   Collection Time: 09/08/20  7:39 PM   Specimen: Urine, Catheterized  Result Value Ref Range Status    Specimen Description   Final    URINE, CATHETERIZED Performed at [facility] [address], [place], [place] [address]    Special Requests   Final    NONE Performed at [facility], [place] [address] [place],  [address]    Culture MULTIPLE SPECIES PRESENT, SUGGEST RECOLLECTION (A)  Final   Report Status 09/10/2020 FINAL  Final    Lab Basic Metabolic Panel: Recent Labs  Lab 09/08/20 0543 09/08/20 0543 09/09/20 0421 09/10/20 0455 09/11/20 0436 09/12/20 0537 09/13/20 0407  NA 135  --  134* 138 139 140  --   K 3.2*  --  4.4 4.4 4.1 3.7  --   CL 93*  --  94* 100 104 107  --   CO2 30  --  26 24 22  14*  --   GLUCOSE 99  --  160* 137* 165* 182*  --   BUN 20  --  29* 41* 49* 53*  --   CREATININE 0.58*  --  0.89 1.33* 1.52* 1.64*  --   CALCIUM 7.7*  --  8.1* 8.0* 8.0* 8.2*  --   MG 1.6*   < > 2.3 2.3 2.2 2.2 2.0  PHOS 3.0  --  3.6  --   --   --   --    < > = values in this interval not displayed.   Liver Function Tests: Recent Labs  Lab 09/08/20 0543 09/09/20 0421  ALBUMIN 2.1* 2.3*   No results for input(s): LIPASE, AMYLASE in the last 168 hours. No results for input(s): AMMONIA in the last 168 hours. CBC: Recent Labs  Lab 09/08/20 0543 09/09/20 0421 09/10/20 0455 09/11/20 0436 09/12/20 0537  WBC 0.4* 0.7* 0.5* 0.7* 1.3*  NEUTROABS 0.1* 0.1* 0.1* 0.2* 0.8*  HGB 8.4* 9.2* 8.9* 8.6* 8.8*  HCT 26.3* 29.1* 27.9* 27.5* 29.2*  MCV 88.0 89.0 87.2 88.7 93.6  PLT 82* 128* 106* 89* 67*   Cardiac Enzymes: No results for input(s): CKTOTAL, CKMB, CKMBINDEX, TROPONINI in the last 168 hours. Sepsis Labs: Recent Labs  Lab 09/08/20 0543 09/08/20 1524 09/08/20 1727 09/09/20 0421 09/10/20 0455 09/11/20 0436 09/12/20 0537  PROCALCITON  --  3.48  --   --   --   --   --   WBC   < >  --   --  0.7* 0.5* 0.7* 1.3*  LATICACIDVEN  --  1.6 1.5  --   --   --   --    < > = values in this interval not displayed.     Procedures/Operations  9/12 RIJ attempted, R pigtail chest tube by ED >> 9/16 removed since non functional 9/12 L Fem CVL >> 9/14 , pulled out 9/16 RT pigtail >>  Patrecia Pour, MD 10/04/2020, 5:13 PM

## 2020-09-15 NOTE — Progress Notes (Signed)
Called to pt room by tech and family.  Pt is without respirations or pulse upon assessment.  Two RNs present at bedside, listened for apical pulse and monitored for respirations x 1 minute.  After one minute of the above assessment pt time of death pronounced at 1605.  Pt pronounced by Tess Blaine,RN & Tyler Aas. Dr. Bonner Puna notified of pt death. Terrilynn Postell, Laurel Dimmer, RN

## 2020-09-15 NOTE — Progress Notes (Signed)
SLP Cancellation Note  Patient Details Name: Tony Marshall MRN: [id number] DOB: 09/22/1941   Cancelled treatment:       Reason Eval/Treat Not Completed: Other (comment) (note pt not responsive, plan for hospital death per md notes)  Kathleen Lime, MS [place] Office [phone]  [name] 2020-09-23, 11:12 AM

## 2020-09-15 NOTE — Plan of Care (Signed)

## 2020-09-15 NOTE — Progress Notes (Signed)
Chaplain provided grief support with family at end of life.    Spouse, SIL, daughter present at bedside.   Pt's priest present at bedside.

## 2020-09-15 NOTE — Progress Notes (Signed)
Daily Progress Note   Patient Name: Tony Marshall       Date: 10/13/20 DOB: [birth year]  Age: 79 y.o. MRN#: [id number] Attending Physician: Patrecia Pour, MD Primary Care Physician: Timoteo Gaul, FNP Admit Date: 08/22/2020  Reason for Consultation/Follow-up: Establishing goals of care  Subjective:   Mr. Trageser is essentially unresponsive at this point.  Has agonal breathing.  Does not have cough does not have overt secretions.  Wife states that overall he rested well overnight.  Appears more pale.  We discussed about end-of-life signs and symptoms.  Wife is appreciative of chaplain support. Wife wishes to avoid as needed opioids and will only consider it if at all needed.  She wishes to continue with current O2 settings, states that patient does not tolerate O2 wean.   Length of Stay: 18  Current Medications: Scheduled Meds:  . mouth rinse  15 mL Mouth Rinse BID  . scopolamine  1 patch Transdermal Q72H  . sodium chloride flush  10 mL Intracatheter Q8H    Continuous Infusions: . sodium chloride 10 mL/hr at 09/11/20 0600    PRN Meds: sodium chloride, acetaminophen **OR** acetaminophen, ALPRAZolam, antiseptic oral rinse, atropine, benzonatate, guaiFENesin-dextromethorphan, haloperidol **OR** haloperidol **OR** haloperidol lactate, HYDROmorphone (DILAUDID) injection, lip balm, ondansetron **OR** ondansetron (ZOFRAN) IV, polyvinyl alcohol  Physical Exam         General: Essentially unresponsive  no non verbal gestures of distress evident.    Weak and frail. HEENT: No bruits, no goiter, no JVD Heart: Regular rate and rhythm. No murmur appreciated. Lungs: Diminished air movement, crackles in base on L Abdomen: Soft, nontender, nondistended, positive bowel sounds.  Ext:  +edema Skin: Warm and dry Neuro: Grossly intact, nonfocal.   Vital Signs: BP (!) 91/54   Pulse (!) 105   Temp 98.1 F (36.7 C) (Axillary)   Resp (!) 22   Ht 5' 7.5" (1.715 m)   Wt 55 kg   SpO2 98%   BMI 18.71 kg/m  SpO2: SpO2: 98 % O2 Device: O2 Device: High Flow Nasal Cannula O2 Flow Rate: O2 Flow Rate (L/min): 8 L/min  Intake/output summary:   Intake/Output Summary (Last 24 hours) at 10-13-20 1057 Last data filed at 2020-10-13 0608 Gross per 24 hour  Intake 100 ml  Output --  Net 100 ml  LBM: Last BM Date: 09/11/20 Baseline Weight: Weight: 52.3 kg Most recent weight: Weight: 55 kg       Palliative Assessment/Data:    Flowsheet Rows     Most Recent Value  Intake Tab  Referral Department Hospitalist  Unit at Time of Referral Med/Surg Unit  Palliative Care Primary Diagnosis Other (Comment)  [FTT]  Date Notified 09/03/20  Palliative Care Type Return patient Palliative Care  Reason for referral Clarify Goals of Care  Date of Admission 09/06/2020  Date first seen by Palliative Care 09/04/20  # of days Palliative referral response time 1 Day(s)  # of days IP prior to Palliative referral 8  Clinical Assessment  Psychosocial & Spiritual Assessment  Palliative Care Outcomes      Patient Active Problem List   Diagnosis Date Noted  . Acute pneumothorax   . Central line complication   . Pneumothorax on right   . HCAP (healthcare-associated pneumonia)   . Severe protein-calorie malnutrition ([place])   . Hypokalemia   . Hypomagnesemia   . Hypophosphatemia   . Acute metabolic encephalopathy   . Pressure injury of skin 08/30/2020  . Septic shock ([place]) 08/27/2020  . Iatrogenic pneumothorax on R  08/27/2020  . AKI (acute kidney injury) ([place]) 08/27/2020  . Hypotension due to hypovolemia   . Advanced care planning/counseling discussion   . Palliative care by specialist   . Cough   . DNR (do not resuscitate)   . Physical deconditioning 07/19/2020  . Stercoral  ulcer of rectum   . Prolonged QT interval 07/03/2020  . Atrial fibrillation ([place])   . Shock circulatory ([place]) 07/01/2020  . Lower GI bleed   . Central venous catheter in place   . Fecal impaction ([place])   . Overflow diarrhea   . Rectal bleeding   . DKA (diabetic ketoacidoses) ([place]) 06/28/2020  . Systolic and diastolic CHF, chronic ([place]) 06/28/2020  . Anemia associated with acute blood loss   . Dehydration 06/27/2020  . Protein-calorie malnutrition, severe ([place]) 06/09/2020  . Hyperglycemia due to diabetes mellitus ([place])   . Other pancytopenia ([place])   . Acute combined systolic and diastolic heart failure ([place])   . Cardiogenic shock ([place])   . Altered mental status   . Failure to thrive in adult   . Symptomatic anemia   . Acute respiratory failure ([place])   . Increased anion gap metabolic acidosis 38/18/2993  . Goals of care, counseling/discussion 03/28/2020  . Encounter for antineoplastic chemotherapy 03/28/2020  . GERD (gastroesophageal reflux disease) 05/13/2014  . History of colonic polyps 05/13/2014  . Hiatal hernia 05/13/2014  . CLL (chronic lymphocytic leukemia) ([place]) 10/25/2013  . ED (erectile dysfunction) 10/20/2013  . Lymphocytosis 09/23/2013  . Essential hypertension 07/21/2013  . Pure hypercholesterolemia 07/21/2013  . Diabetes mellitus ([place]) 07/20/2013    Palliative Care Assessment & Plan  Recommendations/Plan:  Comfort care  Possible hospital death  Probable prognosis hours to some very limited number of days  Appreciate bedside RN, continue current mode of care.   Continue O2, use prn opioids for symptoms.      Code Status:    Code Status Orders  (From admission, onward)         Start     Ordered   08/27/20 0059  Limited resuscitation (code)  Continuous       Question Answer Comment  In the event of cardiac or respiratory ARREST: Initiate Code Blue, Call Rapid Response Yes   In the event of cardiac or respiratory ARREST:  Perform CPR No   In the  event of cardiac or respiratory ARREST: Perform Intubation/Mechanical Ventilation Yes   In the event of cardiac or respiratory ARREST: Use NIPPV/BiPAp only if indicated Yes   In the event of cardiac or respiratory ARREST: Administer ACLS medications if indicated No   In the event of cardiac or respiratory ARREST: Perform Defibrillation or Cardioversion if indicated No      08/27/20 0059        Code Status History    Date Active Date Inactive Code Status Order ID Comments User Context   07/20/2020 1532 07/21/2020 2120 DNR [id number]  Earlie Counts, NP Inpatient   07/19/2020 1949 07/20/2020 1532 Full Code [id number]  Clarnce Flock, MD ED   06/28/2020 1122 07/11/2020 1752 Full Code [id number]  Donne Hazel, MD ED   06/05/2020 1839 06/21/2020 0024 Full Code [id number]  Cherene Altes, MD ED   Advance Care Planning Activity       Prognosis:  Hours to days.   Discharge Planning: Anticipated Hospital Death   Care plan was discussed with IDT Thank you for allowing the Palliative Medicine Team to assist in the care of this patient.   Time In: 8 Time Out: 8.25 Total Time 25 Prolonged Time Billed  No   Greater than 50%  of this time was spent counseling and coordinating care related to the above assessment and plan.   Loistine Chance, MD  Please contact Palliative Medicine Team phone at [phone] for questions and concerns.

## 2020-09-15 NOTE — Plan of Care (Signed)
  Problem: Coping: Goal: Level of anxiety will decrease Outcome: Progressing   Problem: Education: Goal: Knowledge of General Education information will improve Description: Including pain rating scale, medication(s)/side effects and non-pharmacologic comfort measures Outcome: Completed/Met   1000a: Pt is comfort care. Providing education and support to family.

## 2020-09-15 NOTE — Progress Notes (Signed)
Checked on patient  Appears comfortable Agonal respirations  Take chest tube off suction  Discussed with nurse  In-hospital death -Chronic systolic congestive heart failure, CLL on chemotherapy, pneumonia, shock, heart rate genic pneumothorax  Comfort measures only

## 2020-09-15 DEATH — deceased

## 2020-10-10 ENCOUNTER — Ambulatory Visit: Payer: Medicare HMO | Admitting: Endocrinology

## 2020-10-23 ENCOUNTER — Ambulatory Visit: Payer: Medicare HMO | Admitting: General Practice

## 2020-10-29 IMAGING — DX DG CHEST 1V PORT
2 series · 2 of 2 positions shown · non-contrast
Comparison: May 16, 2020

CLINICAL DATA: Fatigue and weakness.

EXAM:
PORTABLE CHEST 1 VIEW

[chest ap (1 of 2)]
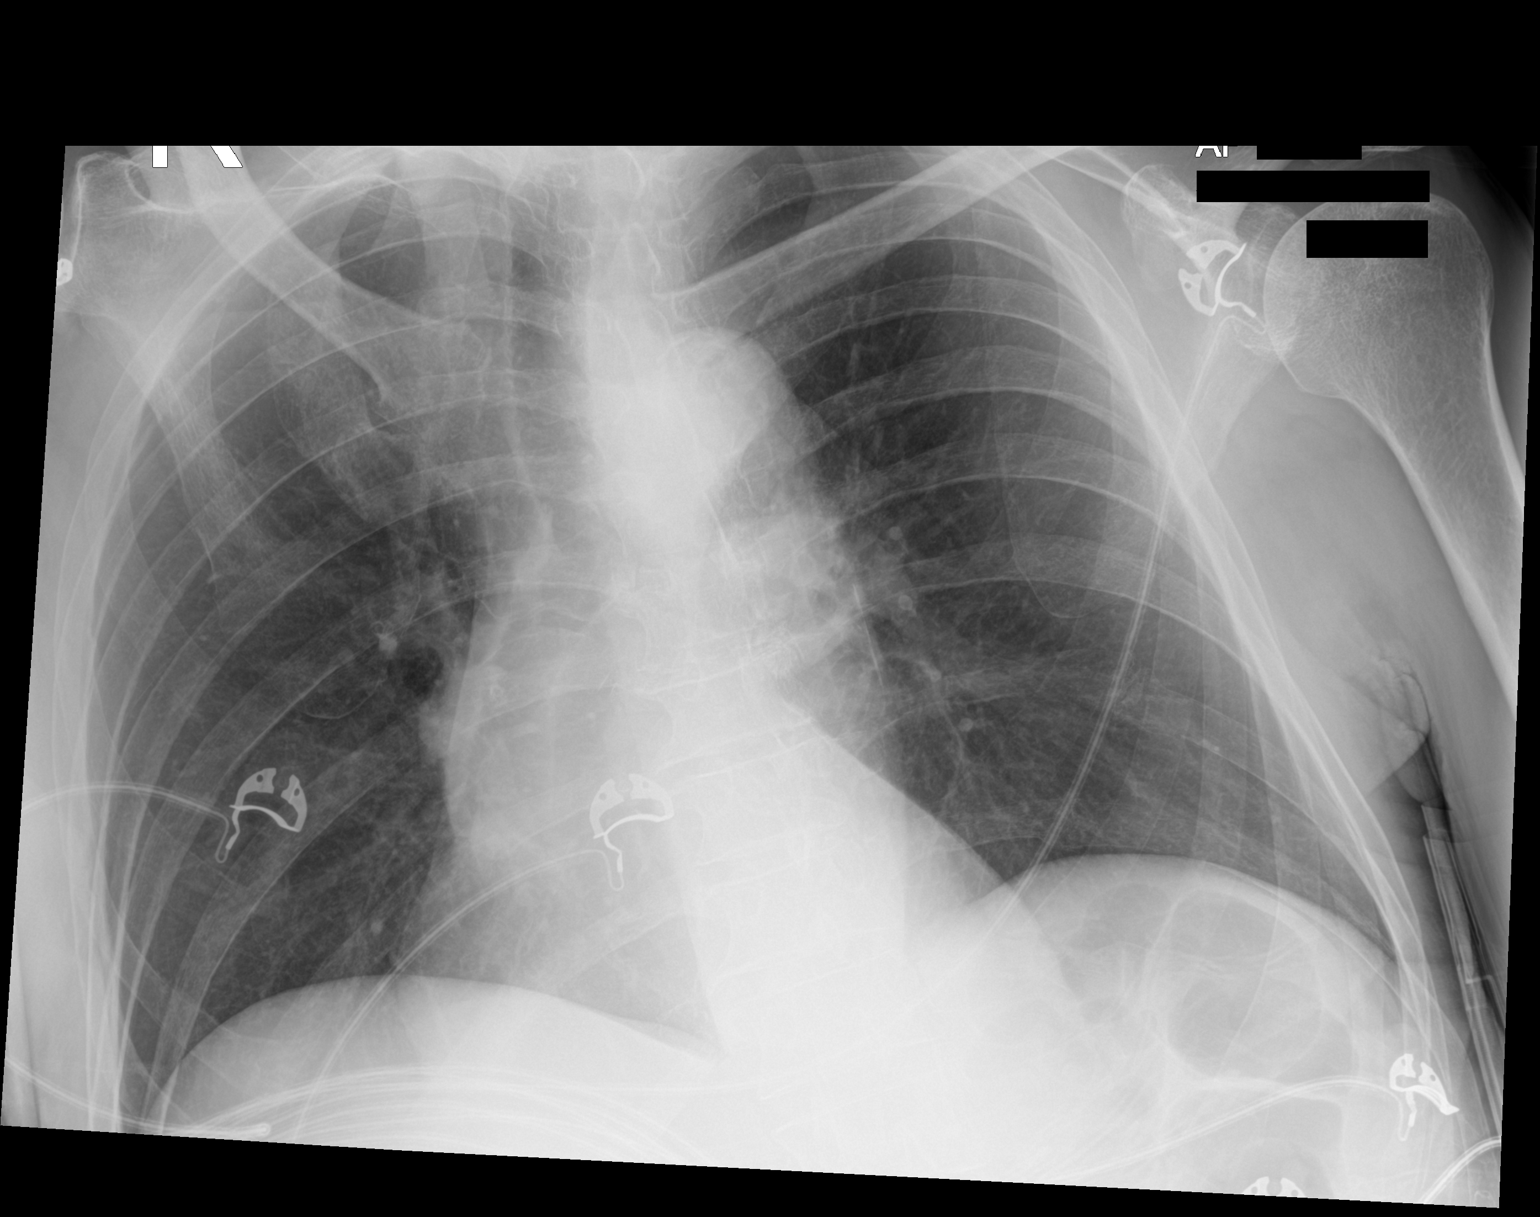

[chest ap (2 of 2)]
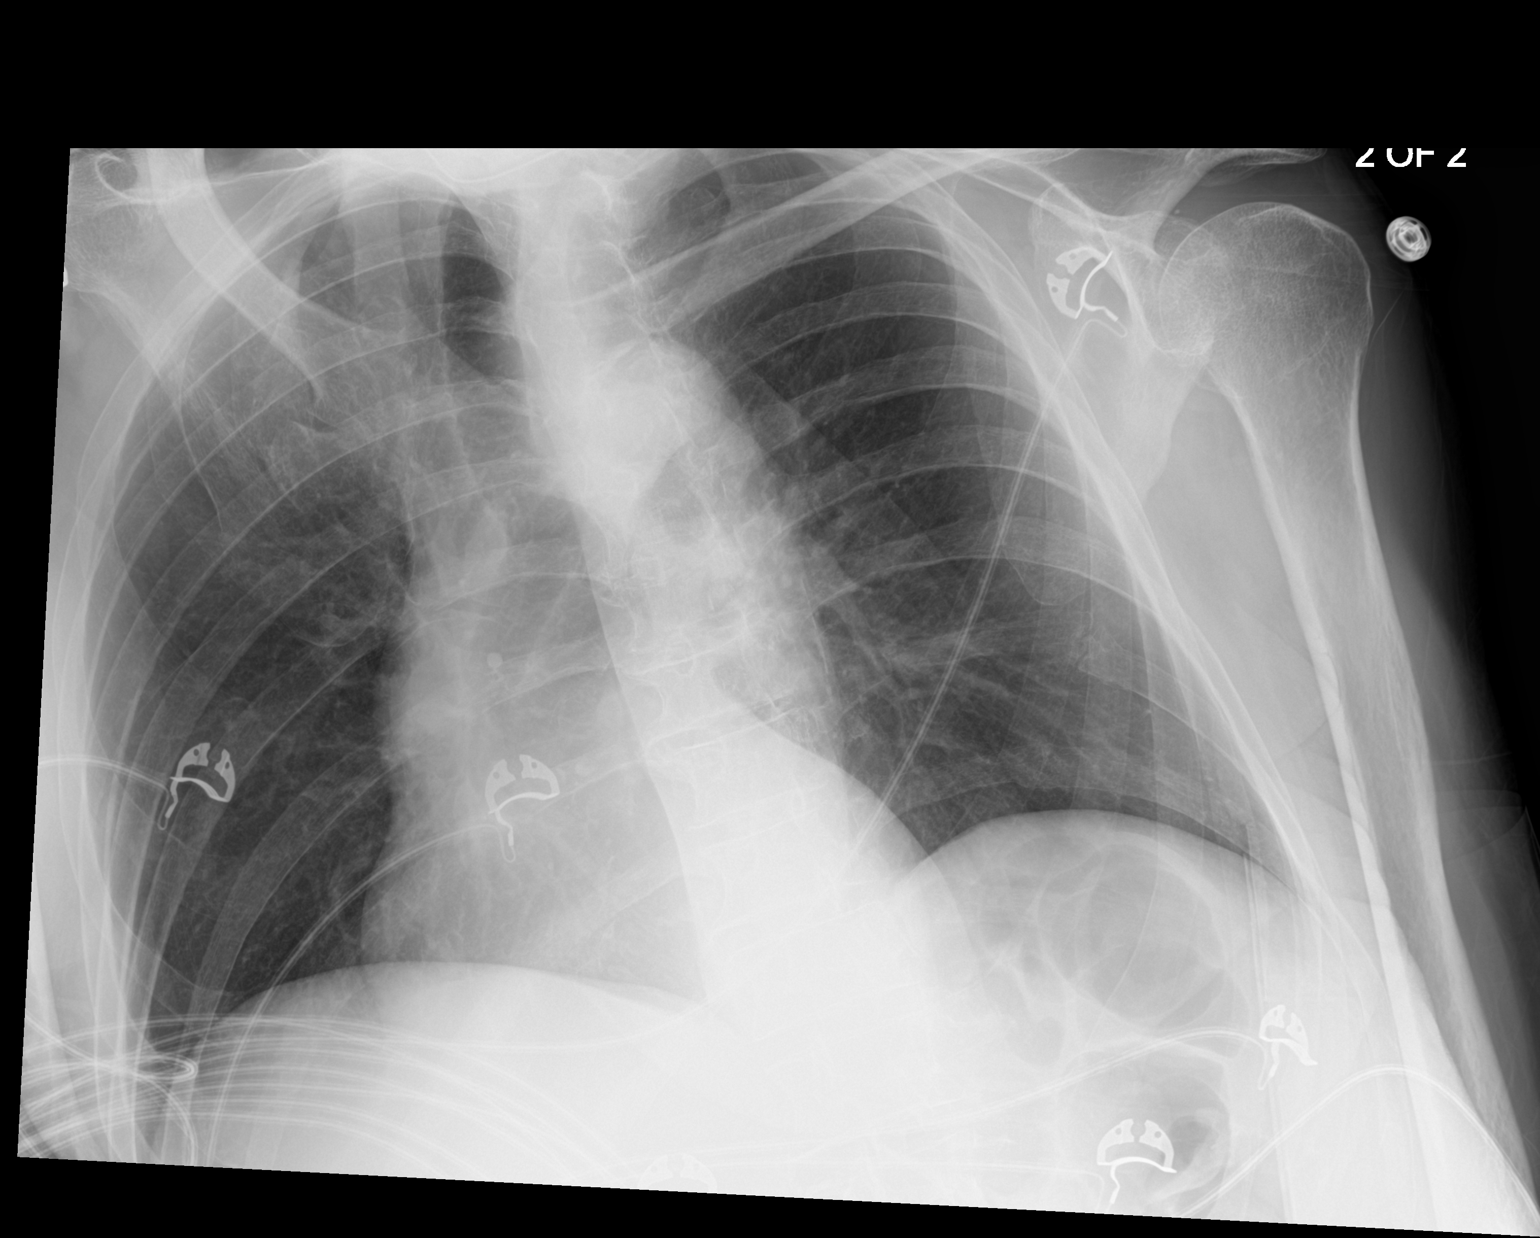

[2 of 2 positions shown; findings below may reference images not displayed]

FINDINGS: The study is limited secondary to patient rotation. There is no
evidence of acute infiltrate, pleural effusion or pneumothorax. The
heart size and mediastinal contours are within normal limits. A
chronic tenth left rib fracture is seen. The visualized skeletal
structures are otherwise unremarkable.
IMPRESSION: No active disease.

## 2020-10-30 ENCOUNTER — Ambulatory Visit: Payer: Medicare HMO | Admitting: Family Medicine

## 2020-10-31 ENCOUNTER — Ambulatory Visit: Payer: Medicare HMO | Admitting: Internal Medicine

## 2020-10-31 ENCOUNTER — Other Ambulatory Visit: Payer: Medicare HMO

## 2020-11-24 IMAGING — DX DG ABD PORTABLE 1V
1 series · 1 of 1 positions shown · non-contrast
Comparison: February 02, 2018.

CLINICAL DATA: Fecal impaction.

EXAM:
PORTABLE ABDOMEN - 1 VIEW

[abdomen kub]
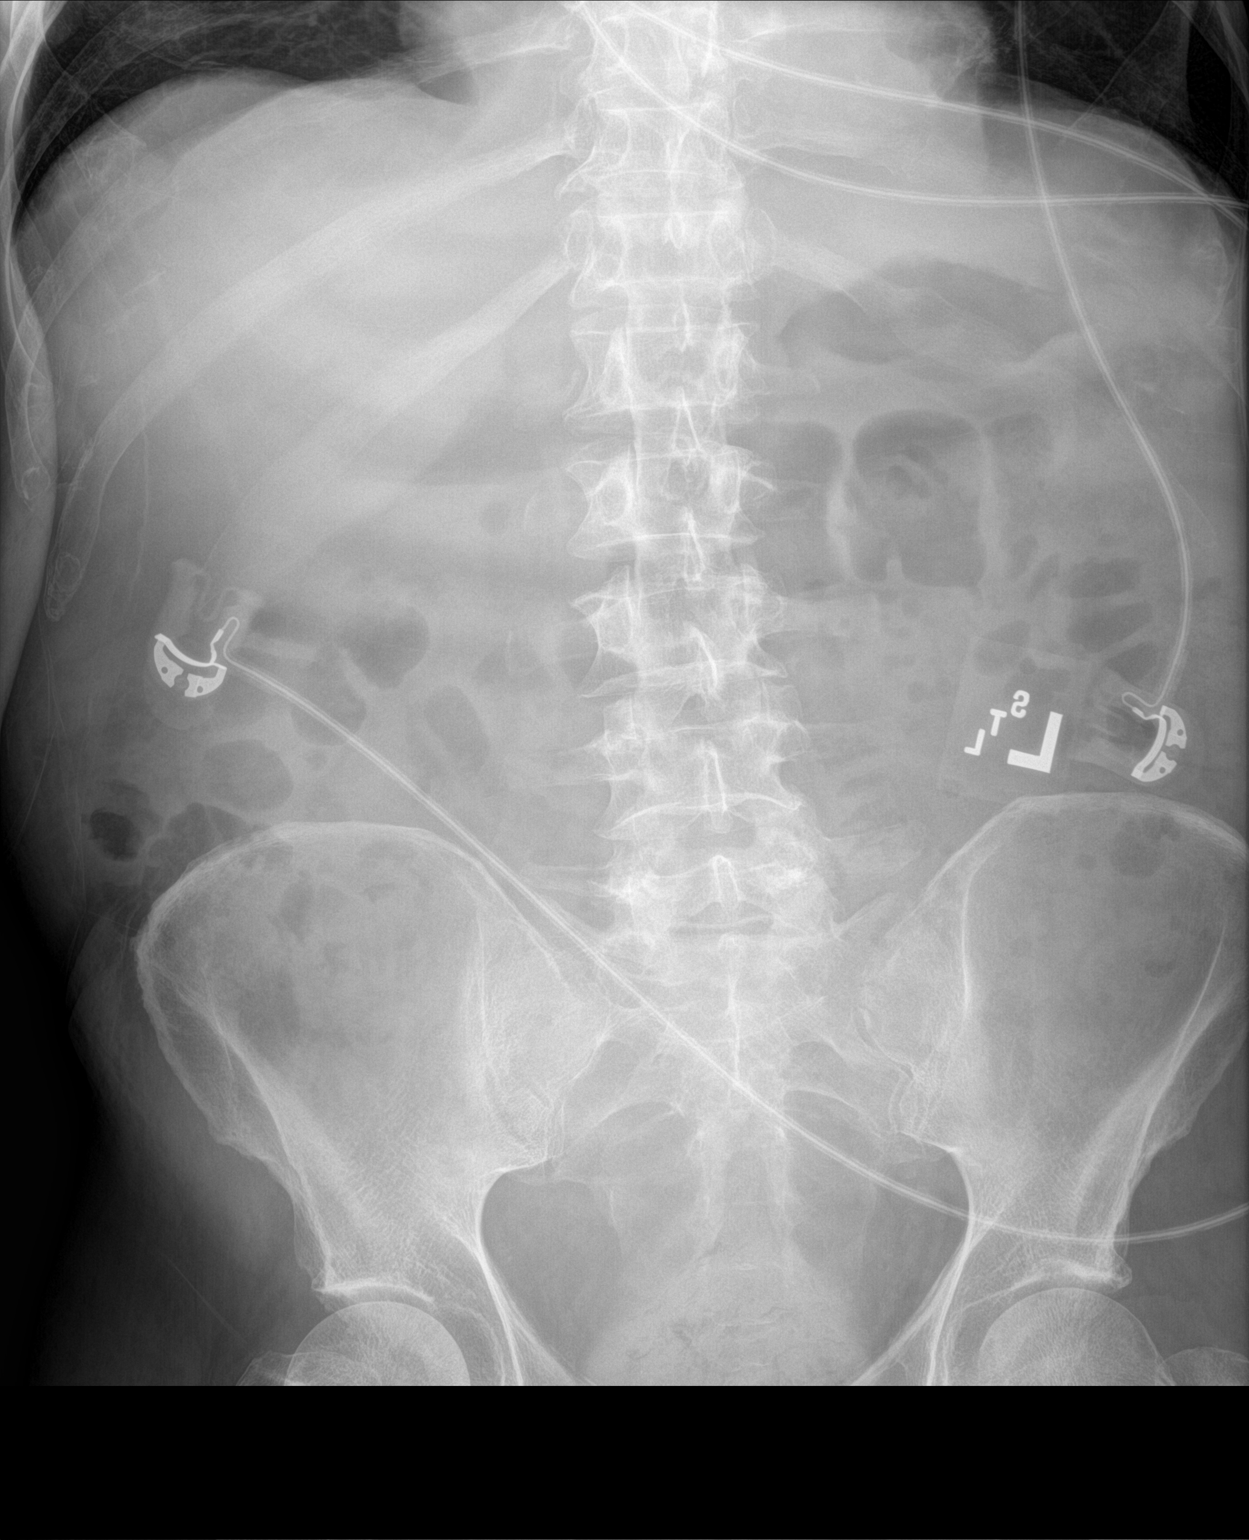

[1 of 1 positions shown; findings below may reference images not displayed]

FINDINGS: No abnormal bowel dilatation is noted. Mild amount of stool is noted
in the rectum. No radio-opaque calculi or other significant
radiographic abnormality are seen.
IMPRESSION: Mild stool burden is noted. No evidence of bowel obstruction or
ileus.

## 2020-11-24 IMAGING — DX DG CHEST 1V PORT
1 series · 1 of 1 positions shown · non-contrast
Comparison: 06/30/2020

CLINICAL DATA: 78-year-old male with acute respiratory failure

EXAM:
PORTABLE CHEST 1 VIEW

[chest ap]
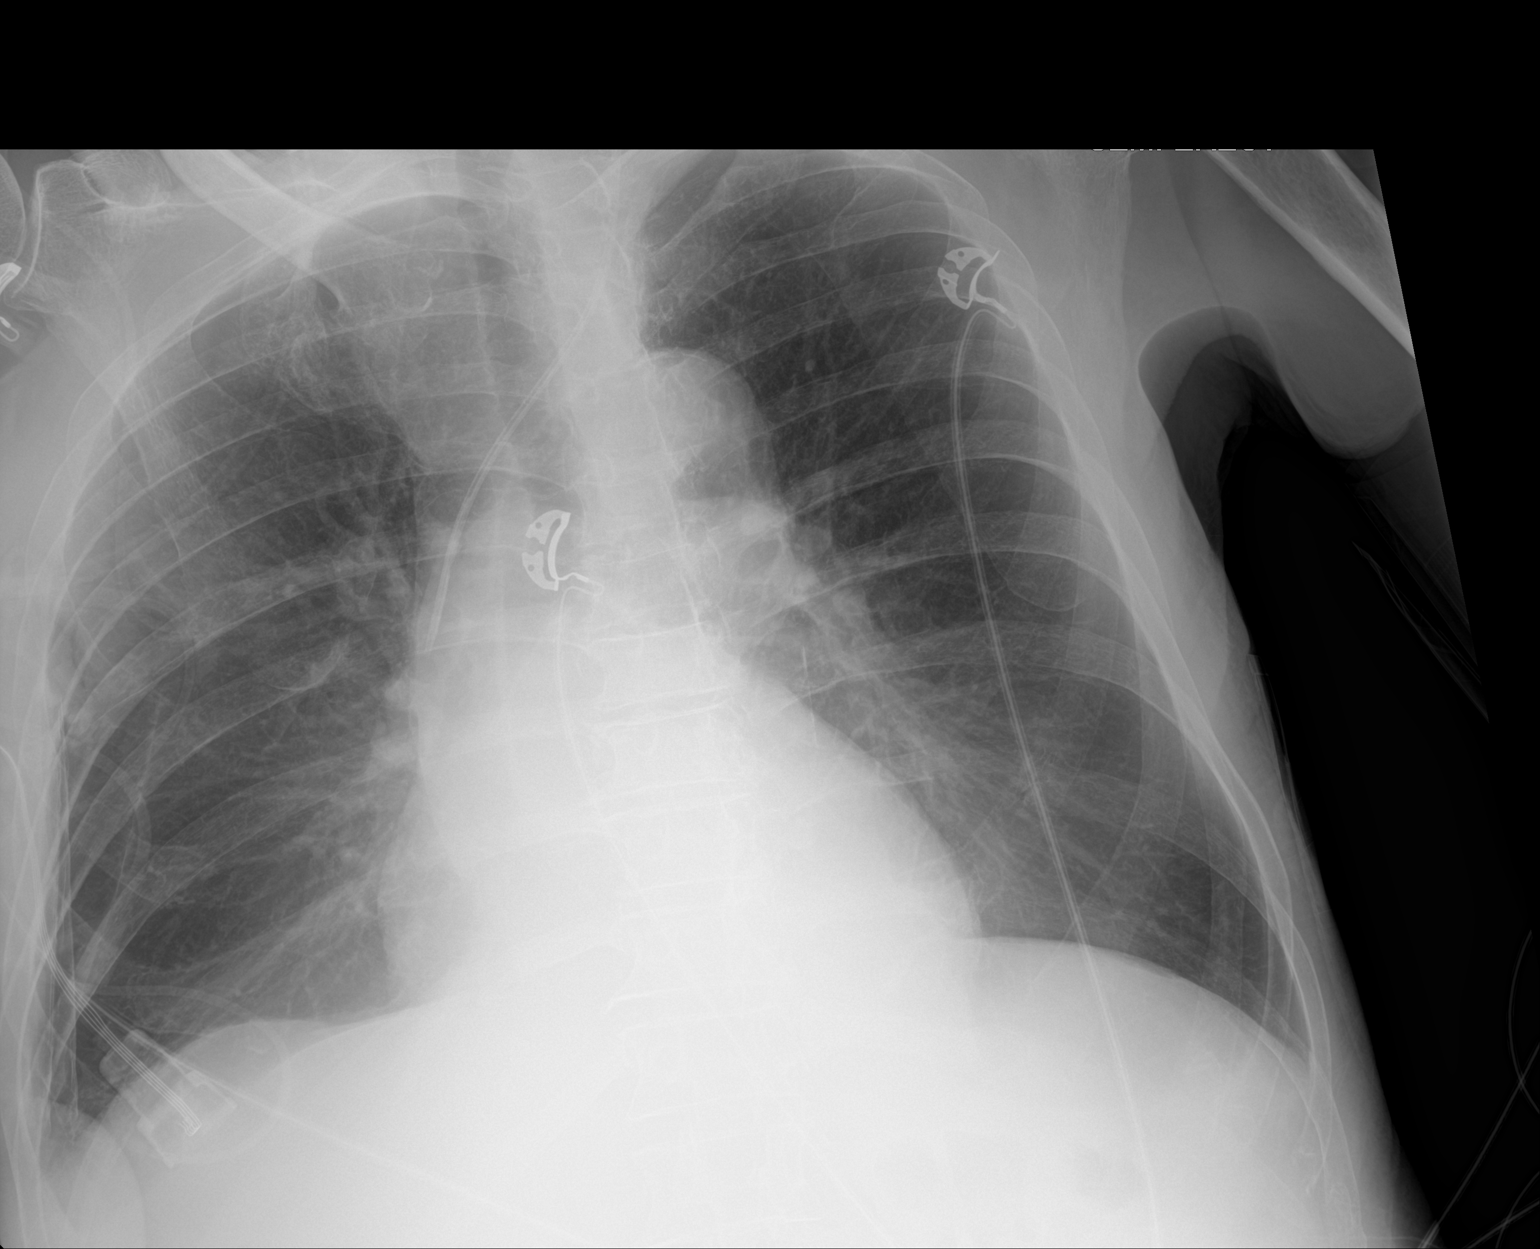

[1 of 1 positions shown; findings below may reference images not displayed]

FINDINGS: Cardiomediastinal silhouette unchanged in size and contour.

Similar appearance of architectural distortion of the right greater
than left lungs.

No pneumothorax. No large pleural effusion. Coarsened interstitial
markings persist with no new confluent airspace disease.

Interval slight withdrawal of the left IJ central venous catheter
with the tip appearing to terminate superior vena cava.
IMPRESSION: Similar appearance of the lungs, with chronic lung changes,
scarring/atelectasis and no new confluent airspace disease.

Interval slight withdrawal of left IJ central venous catheter.

## 2020-11-26 IMAGING — DX DG ABD PORTABLE 1V
1 series · 1 of 1 positions shown · non-contrast
Comparison: Abdominal radiograph 07/01/2020

CLINICAL DATA: 78 y.o. male with constipation. Medical history
significant of CLL, DM, HTN, HLD, combined systolic/diastolic CHF

EXAM:
PORTABLE ABDOMEN - 1 VIEW

[abdomen kub]
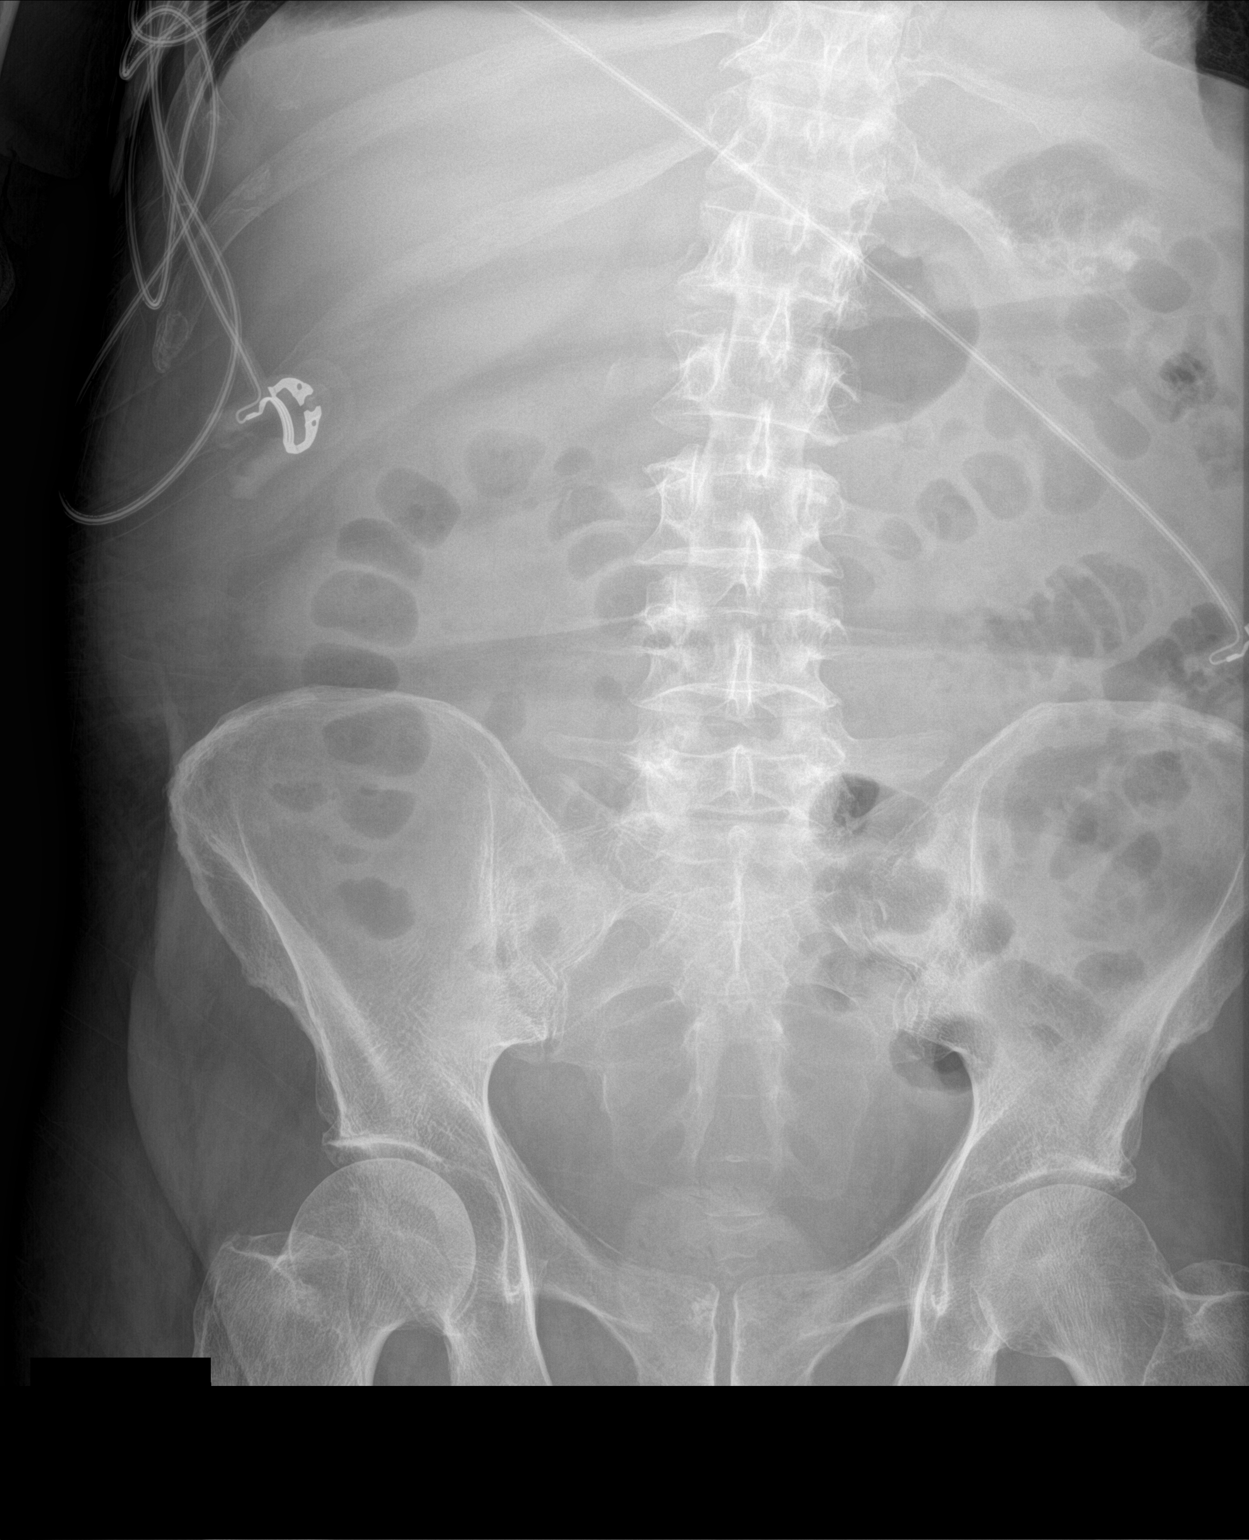

[1 of 1 positions shown; findings below may reference images not displayed]

FINDINGS: The far left hemiabdomen is excluded from field of view. The bowel
gas pattern is nonobstructive. Moderate stool burden. No supine
evidence for free air. No unexpected radiopaque calcification. No
acute finding in the visualized skeleton.
IMPRESSION: Nonobstructive bowel gas pattern. Moderate stool burden.

## 2023-11-05 NOTE — Telephone Encounter (Signed)
Telephone call  

## 2024-08-30 NOTE — Progress Notes (Signed)
Orders placed in this encounter.
